# Patient Record
Sex: Female | Born: 1960 | Race: Black or African American | Hispanic: No | Marital: Married | Smoking: Former smoker
Health system: Southern US, Community
[De-identification: ages and names within clinical notes are randomized; demographics above are authoritative.]

## PROBLEM LIST (undated history)

## (undated) DIAGNOSIS — D126 Benign neoplasm of colon, unspecified: Secondary | ICD-10-CM

## (undated) DIAGNOSIS — K625 Hemorrhage of anus and rectum: Secondary | ICD-10-CM

## (undated) DIAGNOSIS — G4730 Sleep apnea, unspecified: Secondary | ICD-10-CM

## (undated) DIAGNOSIS — T7840XA Allergy, unspecified, initial encounter: Secondary | ICD-10-CM

## (undated) DIAGNOSIS — K648 Other hemorrhoids: Secondary | ICD-10-CM

## (undated) DIAGNOSIS — M5430 Sciatica, unspecified side: Secondary | ICD-10-CM

## (undated) DIAGNOSIS — E785 Hyperlipidemia, unspecified: Secondary | ICD-10-CM

## (undated) DIAGNOSIS — M1990 Unspecified osteoarthritis, unspecified site: Secondary | ICD-10-CM

## (undated) DIAGNOSIS — C801 Malignant (primary) neoplasm, unspecified: Secondary | ICD-10-CM

## (undated) DIAGNOSIS — J449 Chronic obstructive pulmonary disease, unspecified: Secondary | ICD-10-CM

## (undated) DIAGNOSIS — D259 Leiomyoma of uterus, unspecified: Secondary | ICD-10-CM

## (undated) DIAGNOSIS — Z720 Tobacco use: Secondary | ICD-10-CM

## (undated) DIAGNOSIS — K219 Gastro-esophageal reflux disease without esophagitis: Secondary | ICD-10-CM

## (undated) DIAGNOSIS — F1410 Cocaine abuse, uncomplicated: Secondary | ICD-10-CM

## (undated) DIAGNOSIS — J45909 Unspecified asthma, uncomplicated: Secondary | ICD-10-CM

## (undated) DIAGNOSIS — E119 Type 2 diabetes mellitus without complications: Secondary | ICD-10-CM

## (undated) DIAGNOSIS — G8929 Other chronic pain: Secondary | ICD-10-CM

## (undated) DIAGNOSIS — Z923 Personal history of irradiation: Secondary | ICD-10-CM

## (undated) DIAGNOSIS — I10 Essential (primary) hypertension: Secondary | ICD-10-CM

## (undated) DIAGNOSIS — M79606 Pain in leg, unspecified: Secondary | ICD-10-CM

## (undated) DIAGNOSIS — M549 Dorsalgia, unspecified: Secondary | ICD-10-CM

## (undated) DIAGNOSIS — G709 Myoneural disorder, unspecified: Secondary | ICD-10-CM

## (undated) DIAGNOSIS — R0600 Dyspnea, unspecified: Secondary | ICD-10-CM

## (undated) HISTORY — DX: Sciatica, unspecified side: M54.30

## (undated) HISTORY — DX: Other hemorrhoids: K64.8

## (undated) HISTORY — PX: COLONOSCOPY: SHX174

## (undated) HISTORY — DX: Benign neoplasm of colon, unspecified: D12.6

## (undated) HISTORY — DX: Other chronic pain: G89.29

## (undated) HISTORY — DX: Unspecified osteoarthritis, unspecified site: M19.90

## (undated) HISTORY — DX: Cocaine abuse, uncomplicated: F14.10

## (undated) HISTORY — DX: Hyperlipidemia, unspecified: E78.5

## (undated) HISTORY — DX: Leiomyoma of uterus, unspecified: D25.9

## (undated) HISTORY — DX: Myoneural disorder, unspecified: G70.9

## (undated) HISTORY — DX: Allergy, unspecified, initial encounter: T78.40XA

## (undated) HISTORY — PX: UPPER GASTROINTESTINAL ENDOSCOPY: SHX188

## (undated) HISTORY — DX: Malignant (primary) neoplasm, unspecified: C80.1

## (undated) HISTORY — PX: HAND SURGERY: SHX662

## (undated) HISTORY — DX: Essential (primary) hypertension: I10

## (undated) HISTORY — DX: Type 2 diabetes mellitus without complications: E11.9

## (undated) HISTORY — DX: Tobacco use: Z72.0

## (undated) HISTORY — DX: Dorsalgia, unspecified: M54.9

## (undated) HISTORY — DX: Pain in leg, unspecified: M79.606

## (undated) HISTORY — DX: Sleep apnea, unspecified: G47.30

## (undated) HISTORY — PX: REDUCTION MAMMAPLASTY: SUR839

## (undated) HISTORY — DX: Hemorrhage of anus and rectum: K62.5

## (undated) HISTORY — PX: TUBAL LIGATION: SHX77

---

## 1980-06-13 HISTORY — PX: BREAST REDUCTION SURGERY: SHX8

## 1997-12-01 ENCOUNTER — Ambulatory Visit: Admission: RE | Admit: 1997-12-01 | Discharge: 1997-12-01 | Payer: Self-pay | Admitting: Family Medicine

## 1997-12-05 ENCOUNTER — Ambulatory Visit (HOSPITAL_COMMUNITY): Admission: RE | Admit: 1997-12-05 | Discharge: 1997-12-05 | Payer: Self-pay | Admitting: Family Medicine

## 1998-01-15 ENCOUNTER — Encounter: Admission: RE | Admit: 1998-01-15 | Discharge: 1998-04-15 | Payer: Self-pay | Admitting: Family Medicine

## 1998-04-30 ENCOUNTER — Encounter: Admission: RE | Admit: 1998-04-30 | Discharge: 1998-06-22 | Payer: Self-pay | Admitting: Family Medicine

## 1999-06-01 ENCOUNTER — Other Ambulatory Visit: Admission: RE | Admit: 1999-06-01 | Discharge: 1999-06-01 | Payer: Self-pay | Admitting: Family Medicine

## 1999-07-08 ENCOUNTER — Ambulatory Visit (HOSPITAL_COMMUNITY): Admission: RE | Admit: 1999-07-08 | Discharge: 1999-07-08 | Payer: Self-pay | Admitting: Family Medicine

## 1999-07-08 ENCOUNTER — Encounter: Payer: Self-pay | Admitting: Family Medicine

## 1999-08-16 ENCOUNTER — Encounter: Admission: RE | Admit: 1999-08-16 | Discharge: 1999-11-14 | Payer: Self-pay | Admitting: Family Medicine

## 1999-11-15 ENCOUNTER — Encounter: Admission: RE | Admit: 1999-11-15 | Discharge: 2000-02-13 | Payer: Self-pay | Admitting: Family Medicine

## 2000-04-03 ENCOUNTER — Encounter: Admission: RE | Admit: 2000-04-03 | Discharge: 2000-07-02 | Payer: Self-pay | Admitting: Family Medicine

## 2000-07-28 ENCOUNTER — Encounter: Payer: Self-pay | Admitting: Emergency Medicine

## 2000-07-28 ENCOUNTER — Emergency Department (HOSPITAL_COMMUNITY): Admission: EM | Admit: 2000-07-28 | Discharge: 2000-07-28 | Payer: Self-pay | Admitting: Emergency Medicine

## 2000-10-31 ENCOUNTER — Encounter: Admission: RE | Admit: 2000-10-31 | Discharge: 2001-01-29 | Payer: Self-pay | Admitting: Family Medicine

## 2001-02-08 ENCOUNTER — Emergency Department (HOSPITAL_COMMUNITY): Admission: EM | Admit: 2001-02-08 | Discharge: 2001-02-08 | Payer: Self-pay | Admitting: Emergency Medicine

## 2001-02-08 ENCOUNTER — Encounter: Payer: Self-pay | Admitting: Internal Medicine

## 2001-12-31 ENCOUNTER — Encounter: Payer: Self-pay | Admitting: Emergency Medicine

## 2001-12-31 ENCOUNTER — Emergency Department (HOSPITAL_COMMUNITY): Admission: EM | Admit: 2001-12-31 | Discharge: 2001-12-31 | Payer: Self-pay | Admitting: Emergency Medicine

## 2002-01-19 ENCOUNTER — Ambulatory Visit (HOSPITAL_COMMUNITY): Admission: RE | Admit: 2002-01-19 | Discharge: 2002-01-19 | Payer: Self-pay | Admitting: Orthopedic Surgery

## 2002-01-19 ENCOUNTER — Encounter: Payer: Self-pay | Admitting: Orthopedic Surgery

## 2002-02-06 ENCOUNTER — Encounter: Payer: Self-pay | Admitting: Orthopedic Surgery

## 2002-02-06 ENCOUNTER — Encounter: Admission: RE | Admit: 2002-02-06 | Discharge: 2002-02-06 | Payer: Self-pay | Admitting: Orthopedic Surgery

## 2002-02-21 ENCOUNTER — Encounter: Admission: RE | Admit: 2002-02-21 | Discharge: 2002-02-21 | Payer: Self-pay | Admitting: *Deleted

## 2002-02-21 ENCOUNTER — Encounter: Payer: Self-pay | Admitting: Orthopedic Surgery

## 2002-07-26 ENCOUNTER — Emergency Department (HOSPITAL_COMMUNITY): Admission: EM | Admit: 2002-07-26 | Discharge: 2002-07-26 | Payer: Self-pay | Admitting: Emergency Medicine

## 2002-07-26 ENCOUNTER — Encounter: Payer: Self-pay | Admitting: Emergency Medicine

## 2004-03-06 ENCOUNTER — Emergency Department (HOSPITAL_COMMUNITY): Admission: EM | Admit: 2004-03-06 | Discharge: 2004-03-07 | Payer: Self-pay | Admitting: Emergency Medicine

## 2004-12-15 ENCOUNTER — Inpatient Hospital Stay (HOSPITAL_COMMUNITY): Admission: EM | Admit: 2004-12-15 | Discharge: 2004-12-17 | Payer: Self-pay | Admitting: Emergency Medicine

## 2005-06-22 ENCOUNTER — Encounter: Admission: RE | Admit: 2005-06-22 | Discharge: 2005-06-22 | Payer: Self-pay | Admitting: *Deleted

## 2005-07-17 ENCOUNTER — Emergency Department (HOSPITAL_COMMUNITY): Admission: EM | Admit: 2005-07-17 | Discharge: 2005-07-17 | Payer: Self-pay | Admitting: Emergency Medicine

## 2006-06-07 ENCOUNTER — Encounter (INDEPENDENT_AMBULATORY_CARE_PROVIDER_SITE_OTHER): Payer: Self-pay | Admitting: Internal Medicine

## 2006-06-13 HISTORY — PX: TONSILLECTOMY: SUR1361

## 2006-08-28 ENCOUNTER — Ambulatory Visit (HOSPITAL_COMMUNITY): Admission: RE | Admit: 2006-08-28 | Discharge: 2006-08-28 | Payer: Self-pay | Admitting: Obstetrics and Gynecology

## 2006-12-18 ENCOUNTER — Encounter (INDEPENDENT_AMBULATORY_CARE_PROVIDER_SITE_OTHER): Payer: Self-pay | Admitting: Internal Medicine

## 2006-12-18 ENCOUNTER — Encounter (INDEPENDENT_AMBULATORY_CARE_PROVIDER_SITE_OTHER): Payer: Self-pay | Admitting: Gastroenterology

## 2006-12-18 ENCOUNTER — Ambulatory Visit (HOSPITAL_COMMUNITY): Admission: RE | Admit: 2006-12-18 | Discharge: 2006-12-18 | Payer: Self-pay | Admitting: Gastroenterology

## 2007-05-24 ENCOUNTER — Encounter (INDEPENDENT_AMBULATORY_CARE_PROVIDER_SITE_OTHER): Payer: Self-pay | Admitting: Obstetrics and Gynecology

## 2007-05-24 ENCOUNTER — Inpatient Hospital Stay (HOSPITAL_COMMUNITY): Admission: RE | Admit: 2007-05-24 | Discharge: 2007-05-27 | Payer: Self-pay | Admitting: Obstetrics and Gynecology

## 2007-05-24 DIAGNOSIS — Z90710 Acquired absence of both cervix and uterus: Secondary | ICD-10-CM | POA: Insufficient documentation

## 2007-05-24 HISTORY — PX: TOTAL ABDOMINAL HYSTERECTOMY: SHX209

## 2008-08-21 ENCOUNTER — Encounter (INDEPENDENT_AMBULATORY_CARE_PROVIDER_SITE_OTHER): Payer: Self-pay | Admitting: Internal Medicine

## 2008-08-28 ENCOUNTER — Encounter: Admission: RE | Admit: 2008-08-28 | Discharge: 2008-08-28 | Payer: Self-pay | Admitting: Internal Medicine

## 2008-09-15 ENCOUNTER — Emergency Department (HOSPITAL_COMMUNITY): Admission: EM | Admit: 2008-09-15 | Discharge: 2008-09-15 | Payer: Self-pay | Admitting: Emergency Medicine

## 2008-11-12 ENCOUNTER — Encounter (INDEPENDENT_AMBULATORY_CARE_PROVIDER_SITE_OTHER): Payer: Self-pay | Admitting: Internal Medicine

## 2008-12-09 ENCOUNTER — Encounter (INDEPENDENT_AMBULATORY_CARE_PROVIDER_SITE_OTHER): Payer: Self-pay | Admitting: Internal Medicine

## 2008-12-09 ENCOUNTER — Ambulatory Visit: Payer: Self-pay | Admitting: Internal Medicine

## 2008-12-09 DIAGNOSIS — M5126 Other intervertebral disc displacement, lumbar region: Secondary | ICD-10-CM | POA: Insufficient documentation

## 2008-12-09 DIAGNOSIS — E1169 Type 2 diabetes mellitus with other specified complication: Secondary | ICD-10-CM | POA: Insufficient documentation

## 2008-12-09 DIAGNOSIS — K625 Hemorrhage of anus and rectum: Secondary | ICD-10-CM | POA: Insufficient documentation

## 2008-12-09 DIAGNOSIS — E785 Hyperlipidemia, unspecified: Secondary | ICD-10-CM | POA: Insufficient documentation

## 2008-12-09 DIAGNOSIS — E119 Type 2 diabetes mellitus without complications: Secondary | ICD-10-CM | POA: Insufficient documentation

## 2008-12-09 DIAGNOSIS — Z87891 Personal history of nicotine dependence: Secondary | ICD-10-CM | POA: Insufficient documentation

## 2008-12-09 DIAGNOSIS — Z9189 Other specified personal risk factors, not elsewhere classified: Secondary | ICD-10-CM | POA: Insufficient documentation

## 2008-12-09 DIAGNOSIS — D259 Leiomyoma of uterus, unspecified: Secondary | ICD-10-CM | POA: Insufficient documentation

## 2008-12-09 HISTORY — DX: Hemorrhage of anus and rectum: K62.5

## 2008-12-09 LAB — CONVERTED CEMR LAB: Hgb A1c MFr Bld: 9 %

## 2008-12-10 ENCOUNTER — Encounter (INDEPENDENT_AMBULATORY_CARE_PROVIDER_SITE_OTHER): Payer: Self-pay | Admitting: Internal Medicine

## 2008-12-10 LAB — CONVERTED CEMR LAB
ALT: 54 units/L — ABNORMAL HIGH (ref 0–35)
Alkaline Phosphatase: 71 units/L (ref 39–117)
CO2: 27 meq/L (ref 19–32)
HCT: 41.4 % (ref 36.0–46.0)
Hemoglobin: 13.2 g/dL (ref 12.0–15.0)
LDL Cholesterol: 103 mg/dL — ABNORMAL HIGH (ref 0–99)
MCHC: 31.9 g/dL (ref 30.0–36.0)
MCV: 85.7 fL (ref 78.0–100.0)
Potassium: 3.8 meq/L (ref 3.5–5.3)
RBC: 4.83 M/uL (ref 3.87–5.11)
Sodium: 139 meq/L (ref 135–145)
Total Bilirubin: 0.3 mg/dL (ref 0.3–1.2)
Total Protein: 8 g/dL (ref 6.0–8.3)
VLDL: 21 mg/dL (ref 0–40)

## 2008-12-16 ENCOUNTER — Encounter: Admission: RE | Admit: 2008-12-16 | Discharge: 2008-12-16 | Payer: Self-pay | Admitting: Sports Medicine

## 2008-12-29 ENCOUNTER — Encounter: Admission: RE | Admit: 2008-12-29 | Discharge: 2008-12-29 | Payer: Self-pay | Admitting: Sports Medicine

## 2009-01-06 ENCOUNTER — Emergency Department (HOSPITAL_COMMUNITY): Admission: EM | Admit: 2009-01-06 | Discharge: 2009-01-06 | Payer: Self-pay | Admitting: Family Medicine

## 2009-01-08 ENCOUNTER — Ambulatory Visit: Payer: Self-pay | Admitting: Internal Medicine

## 2009-01-08 DIAGNOSIS — K219 Gastro-esophageal reflux disease without esophagitis: Secondary | ICD-10-CM | POA: Insufficient documentation

## 2009-01-08 LAB — CONVERTED CEMR LAB: Blood Glucose, Home Monitor: 2 mg/dL

## 2009-01-13 LAB — CONVERTED CEMR LAB
AST: 51 units/L — ABNORMAL HIGH (ref 0–37)
Albumin: 4.2 g/dL (ref 3.5–5.2)
Alkaline Phosphatase: 57 units/L (ref 39–117)
BUN: 21 mg/dL (ref 6–23)
Potassium: 4.1 meq/L (ref 3.5–5.3)
Sodium: 140 meq/L (ref 135–145)
Total Protein: 8.4 g/dL — ABNORMAL HIGH (ref 6.0–8.3)

## 2009-01-20 ENCOUNTER — Emergency Department (HOSPITAL_COMMUNITY): Admission: EM | Admit: 2009-01-20 | Discharge: 2009-01-20 | Payer: Self-pay | Admitting: Emergency Medicine

## 2009-01-20 ENCOUNTER — Encounter: Payer: Self-pay | Admitting: Internal Medicine

## 2009-02-18 ENCOUNTER — Encounter (INDEPENDENT_AMBULATORY_CARE_PROVIDER_SITE_OTHER): Payer: Self-pay | Admitting: Internal Medicine

## 2009-04-03 ENCOUNTER — Encounter (INDEPENDENT_AMBULATORY_CARE_PROVIDER_SITE_OTHER): Payer: Self-pay | Admitting: Internal Medicine

## 2009-04-03 ENCOUNTER — Ambulatory Visit: Payer: Self-pay | Admitting: Internal Medicine

## 2009-04-03 DIAGNOSIS — R1084 Generalized abdominal pain: Secondary | ICD-10-CM | POA: Insufficient documentation

## 2009-04-03 DIAGNOSIS — R7401 Elevation of levels of liver transaminase levels: Secondary | ICD-10-CM | POA: Insufficient documentation

## 2009-04-03 DIAGNOSIS — R740 Nonspecific elevation of levels of transaminase and lactic acid dehydrogenase [LDH]: Secondary | ICD-10-CM

## 2009-04-03 LAB — CONVERTED CEMR LAB: Hgb A1c MFr Bld: 7.5 %

## 2009-04-07 LAB — CONVERTED CEMR LAB
ALT: 69 units/L — ABNORMAL HIGH (ref 0–35)
AST: 60 units/L — ABNORMAL HIGH (ref 0–37)
Calcium: 10.4 mg/dL (ref 8.4–10.5)
Chloride: 97 meq/L (ref 96–112)
Creatinine, Ser: 0.88 mg/dL (ref 0.40–1.20)
Creatinine, Urine: 194.9 mg/dL
HCV Ab: NEGATIVE
Hep B Core Total Ab: NEGATIVE
Hepatitis B Surface Ag: NEGATIVE
LDL Cholesterol: 174 mg/dL — ABNORMAL HIGH (ref 0–99)
Microalb Creat Ratio: 4.2 mg/g (ref 0.0–30.0)
Total CHOL/HDL Ratio: 7
VLDL: 31 mg/dL (ref 0–40)

## 2009-04-15 ENCOUNTER — Ambulatory Visit: Payer: Self-pay | Admitting: Infectious Disease

## 2009-04-15 ENCOUNTER — Emergency Department (HOSPITAL_COMMUNITY): Admission: EM | Admit: 2009-04-15 | Discharge: 2009-04-15 | Payer: Self-pay | Admitting: Family Medicine

## 2009-04-15 ENCOUNTER — Ambulatory Visit: Payer: Self-pay | Admitting: Internal Medicine

## 2009-04-15 ENCOUNTER — Encounter (INDEPENDENT_AMBULATORY_CARE_PROVIDER_SITE_OTHER): Payer: Self-pay | Admitting: Internal Medicine

## 2009-04-15 ENCOUNTER — Inpatient Hospital Stay (HOSPITAL_COMMUNITY): Admission: AD | Admit: 2009-04-15 | Discharge: 2009-04-18 | Payer: Self-pay | Admitting: Internal Medicine

## 2009-04-15 DIAGNOSIS — R1013 Epigastric pain: Secondary | ICD-10-CM

## 2009-04-15 DIAGNOSIS — Z9189 Other specified personal risk factors, not elsewhere classified: Secondary | ICD-10-CM

## 2009-04-15 DIAGNOSIS — R079 Chest pain, unspecified: Secondary | ICD-10-CM | POA: Insufficient documentation

## 2009-04-15 DIAGNOSIS — K921 Melena: Secondary | ICD-10-CM | POA: Insufficient documentation

## 2009-04-17 ENCOUNTER — Encounter (INDEPENDENT_AMBULATORY_CARE_PROVIDER_SITE_OTHER): Payer: Self-pay | Admitting: Internal Medicine

## 2009-04-21 ENCOUNTER — Ambulatory Visit: Payer: Self-pay | Admitting: Infectious Disease

## 2009-04-28 ENCOUNTER — Ambulatory Visit (HOSPITAL_COMMUNITY): Admission: RE | Admit: 2009-04-28 | Discharge: 2009-04-28 | Payer: Self-pay | Admitting: Gastroenterology

## 2009-04-30 ENCOUNTER — Ambulatory Visit: Payer: Self-pay | Admitting: Internal Medicine

## 2009-04-30 DIAGNOSIS — E876 Hypokalemia: Secondary | ICD-10-CM

## 2009-04-30 LAB — CONVERTED CEMR LAB
BUN: 7 mg/dL (ref 6–23)
CO2: 26 meq/L (ref 19–32)
Chloride: 106 meq/L (ref 96–112)
Creatinine, Ser: 0.66 mg/dL (ref 0.40–1.20)
Glucose, Bld: 80 mg/dL (ref 70–99)

## 2009-05-04 ENCOUNTER — Telehealth (INDEPENDENT_AMBULATORY_CARE_PROVIDER_SITE_OTHER): Payer: Self-pay | Admitting: Internal Medicine

## 2009-05-11 ENCOUNTER — Encounter (INDEPENDENT_AMBULATORY_CARE_PROVIDER_SITE_OTHER): Payer: Self-pay | Admitting: Internal Medicine

## 2009-05-18 ENCOUNTER — Telehealth (INDEPENDENT_AMBULATORY_CARE_PROVIDER_SITE_OTHER): Payer: Self-pay | Admitting: *Deleted

## 2009-05-26 ENCOUNTER — Ambulatory Visit: Payer: Self-pay | Admitting: Internal Medicine

## 2009-05-26 ENCOUNTER — Encounter (INDEPENDENT_AMBULATORY_CARE_PROVIDER_SITE_OTHER): Payer: Self-pay | Admitting: Internal Medicine

## 2009-05-26 ENCOUNTER — Telehealth: Payer: Self-pay | Admitting: Internal Medicine

## 2009-06-09 ENCOUNTER — Telehealth (INDEPENDENT_AMBULATORY_CARE_PROVIDER_SITE_OTHER): Payer: Self-pay | Admitting: Internal Medicine

## 2009-06-11 ENCOUNTER — Encounter (INDEPENDENT_AMBULATORY_CARE_PROVIDER_SITE_OTHER): Payer: Self-pay | Admitting: Internal Medicine

## 2009-06-11 ENCOUNTER — Ambulatory Visit: Payer: Self-pay | Admitting: Internal Medicine

## 2009-06-11 DIAGNOSIS — J069 Acute upper respiratory infection, unspecified: Secondary | ICD-10-CM | POA: Insufficient documentation

## 2009-06-11 DIAGNOSIS — J351 Hypertrophy of tonsils: Secondary | ICD-10-CM

## 2009-06-12 ENCOUNTER — Encounter (INDEPENDENT_AMBULATORY_CARE_PROVIDER_SITE_OTHER): Payer: Self-pay | Admitting: Internal Medicine

## 2009-06-17 LAB — CONVERTED CEMR LAB
Alkaline Phosphatase: 65 units/L (ref 39–117)
BUN: 7 mg/dL (ref 6–23)
Cholesterol: 187 mg/dL (ref 0–200)
Glucose, Bld: 179 mg/dL — ABNORMAL HIGH (ref 70–99)
HDL: 33 mg/dL — ABNORMAL LOW (ref >39)
LDL Cholesterol: 131 mg/dL — ABNORMAL HIGH (ref 0–99)
Total Bilirubin: 0.3 mg/dL (ref 0.3–1.2)
Triglycerides: 114 mg/dL (ref <150)
VLDL: 23 mg/dL (ref 0–40)

## 2009-06-25 ENCOUNTER — Encounter (INDEPENDENT_AMBULATORY_CARE_PROVIDER_SITE_OTHER): Payer: Self-pay | Admitting: Internal Medicine

## 2009-06-25 ENCOUNTER — Ambulatory Visit: Payer: Self-pay | Admitting: Internal Medicine

## 2009-06-26 ENCOUNTER — Telehealth (INDEPENDENT_AMBULATORY_CARE_PROVIDER_SITE_OTHER): Payer: Self-pay | Admitting: Internal Medicine

## 2009-07-01 ENCOUNTER — Telehealth (INDEPENDENT_AMBULATORY_CARE_PROVIDER_SITE_OTHER): Payer: Self-pay | Admitting: Internal Medicine

## 2009-07-07 ENCOUNTER — Ambulatory Visit: Payer: Self-pay | Admitting: Internal Medicine

## 2009-07-07 DIAGNOSIS — I10 Essential (primary) hypertension: Secondary | ICD-10-CM

## 2009-07-07 DIAGNOSIS — I152 Hypertension secondary to endocrine disorders: Secondary | ICD-10-CM | POA: Insufficient documentation

## 2009-07-07 DIAGNOSIS — E559 Vitamin D deficiency, unspecified: Secondary | ICD-10-CM | POA: Insufficient documentation

## 2009-07-09 ENCOUNTER — Ambulatory Visit (HOSPITAL_COMMUNITY)
Admission: RE | Admit: 2009-07-09 | Discharge: 2009-07-10 | Payer: Self-pay | Source: Home / Self Care | Admitting: Otolaryngology

## 2009-07-09 ENCOUNTER — Encounter (INDEPENDENT_AMBULATORY_CARE_PROVIDER_SITE_OTHER): Payer: Self-pay | Admitting: Otolaryngology

## 2009-07-13 ENCOUNTER — Telehealth: Payer: Self-pay | Admitting: Internal Medicine

## 2009-07-18 ENCOUNTER — Emergency Department (HOSPITAL_COMMUNITY): Admission: EM | Admit: 2009-07-18 | Discharge: 2009-07-19 | Payer: Self-pay | Admitting: Emergency Medicine

## 2009-07-23 ENCOUNTER — Ambulatory Visit: Payer: Self-pay | Admitting: Internal Medicine

## 2009-07-23 ENCOUNTER — Encounter (INDEPENDENT_AMBULATORY_CARE_PROVIDER_SITE_OTHER): Payer: Self-pay | Admitting: Internal Medicine

## 2009-07-23 LAB — CONVERTED CEMR LAB: Blood Glucose, Fingerstick: 266

## 2009-07-28 ENCOUNTER — Ambulatory Visit: Payer: Self-pay | Admitting: Internal Medicine

## 2009-07-28 LAB — CONVERTED CEMR LAB: Hep A IgM: NEGATIVE

## 2009-07-29 ENCOUNTER — Encounter (INDEPENDENT_AMBULATORY_CARE_PROVIDER_SITE_OTHER): Payer: Self-pay | Admitting: Internal Medicine

## 2009-07-30 LAB — CONVERTED CEMR LAB
ALT: 51 units/L — ABNORMAL HIGH (ref 0–35)
Albumin: 4 g/dL (ref 3.5–5.2)
BUN: 12 mg/dL (ref 6–23)
CO2: 31 meq/L (ref 19–32)
Calcium: 9.9 mg/dL (ref 8.4–10.5)
Chloride: 95 meq/L — ABNORMAL LOW (ref 96–112)
Cholesterol: 192 mg/dL (ref 0–200)
Creatinine, Ser: 0.71 mg/dL (ref 0.40–1.20)
Potassium: 3.8 meq/L (ref 3.5–5.3)
Total CHOL/HDL Ratio: 5.6

## 2009-08-06 ENCOUNTER — Encounter (INDEPENDENT_AMBULATORY_CARE_PROVIDER_SITE_OTHER): Payer: Self-pay | Admitting: Internal Medicine

## 2009-08-06 ENCOUNTER — Ambulatory Visit: Payer: Self-pay | Admitting: Internal Medicine

## 2009-08-06 LAB — CONVERTED CEMR LAB: Blood Glucose, Fingerstick: 118

## 2009-08-07 ENCOUNTER — Telehealth (INDEPENDENT_AMBULATORY_CARE_PROVIDER_SITE_OTHER): Payer: Self-pay | Admitting: Internal Medicine

## 2009-08-13 ENCOUNTER — Ambulatory Visit (HOSPITAL_COMMUNITY): Admission: RE | Admit: 2009-08-13 | Discharge: 2009-08-13 | Payer: Self-pay | Admitting: Sports Medicine

## 2009-08-18 ENCOUNTER — Ambulatory Visit: Payer: Self-pay | Admitting: Internal Medicine

## 2009-08-18 ENCOUNTER — Encounter (INDEPENDENT_AMBULATORY_CARE_PROVIDER_SITE_OTHER): Payer: Self-pay | Admitting: Internal Medicine

## 2009-08-25 ENCOUNTER — Encounter: Admission: RE | Admit: 2009-08-25 | Discharge: 2009-08-25 | Payer: Self-pay | Admitting: Sports Medicine

## 2009-08-31 ENCOUNTER — Ambulatory Visit: Payer: Self-pay | Admitting: Internal Medicine

## 2009-08-31 ENCOUNTER — Telehealth: Payer: Self-pay | Admitting: *Deleted

## 2009-08-31 ENCOUNTER — Encounter (INDEPENDENT_AMBULATORY_CARE_PROVIDER_SITE_OTHER): Payer: Self-pay | Admitting: Internal Medicine

## 2009-08-31 DIAGNOSIS — R059 Cough, unspecified: Secondary | ICD-10-CM | POA: Insufficient documentation

## 2009-08-31 DIAGNOSIS — R05 Cough: Secondary | ICD-10-CM

## 2009-09-02 ENCOUNTER — Ambulatory Visit (HOSPITAL_COMMUNITY): Admission: RE | Admit: 2009-09-02 | Discharge: 2009-09-02 | Payer: Self-pay | Admitting: Internal Medicine

## 2009-09-14 ENCOUNTER — Encounter: Admission: RE | Admit: 2009-09-14 | Discharge: 2009-09-14 | Payer: Self-pay | Admitting: Sports Medicine

## 2009-09-28 ENCOUNTER — Encounter: Admission: RE | Admit: 2009-09-28 | Discharge: 2009-09-28 | Payer: Self-pay | Admitting: Sports Medicine

## 2009-09-28 ENCOUNTER — Encounter (INDEPENDENT_AMBULATORY_CARE_PROVIDER_SITE_OTHER): Payer: Self-pay | Admitting: Internal Medicine

## 2009-09-30 ENCOUNTER — Ambulatory Visit: Payer: Self-pay | Admitting: Internal Medicine

## 2009-09-30 LAB — CONVERTED CEMR LAB
Blood Glucose, Fingerstick: 138
Hgb A1c MFr Bld: 9.2 %

## 2009-10-05 ENCOUNTER — Encounter (INDEPENDENT_AMBULATORY_CARE_PROVIDER_SITE_OTHER): Payer: Self-pay | Admitting: Internal Medicine

## 2009-10-05 LAB — CONVERTED CEMR LAB
Alkaline Phosphatase: 73 units/L (ref 39–117)
BUN: 16 mg/dL (ref 6–23)
CO2: 27 meq/L (ref 19–32)
Cholesterol: 254 mg/dL — ABNORMAL HIGH (ref 0–200)
Creatinine, Ser: 0.6 mg/dL (ref 0.40–1.20)
Glucose, Bld: 132 mg/dL — ABNORMAL HIGH (ref 70–99)
HDL: 55 mg/dL (ref >39)
LDL Cholesterol: 179 mg/dL — ABNORMAL HIGH (ref 0–99)
Microalb, Ur: 0.99 mg/dL (ref 0.00–1.89)
Sodium: 138 meq/L (ref 135–145)
Total Bilirubin: 0.4 mg/dL (ref 0.3–1.2)
Total CHOL/HDL Ratio: 4.6
Total Protein: 8.3 g/dL (ref 6.0–8.3)
Triglycerides: 101 mg/dL (ref <150)
VLDL: 20 mg/dL (ref 0–40)

## 2009-10-08 ENCOUNTER — Encounter (INDEPENDENT_AMBULATORY_CARE_PROVIDER_SITE_OTHER): Payer: Self-pay | Admitting: Internal Medicine

## 2009-10-19 ENCOUNTER — Telehealth: Payer: Self-pay | Admitting: Internal Medicine

## 2009-10-19 ENCOUNTER — Ambulatory Visit: Payer: Self-pay | Admitting: Internal Medicine

## 2009-10-19 DIAGNOSIS — R51 Headache: Secondary | ICD-10-CM

## 2009-10-19 DIAGNOSIS — R519 Headache, unspecified: Secondary | ICD-10-CM | POA: Insufficient documentation

## 2009-10-19 LAB — CONVERTED CEMR LAB
BUN: 6 mg/dL (ref 6–23)
CO2: 33 meq/L — ABNORMAL HIGH (ref 19–32)
Calcium: 9.9 mg/dL (ref 8.4–10.5)
Chloride: 100 meq/L (ref 96–112)
Creatinine, Ser: 0.63 mg/dL (ref 0.40–1.20)
Glucose, Bld: 325 mg/dL — ABNORMAL HIGH (ref 70–99)

## 2009-11-06 ENCOUNTER — Ambulatory Visit: Payer: Self-pay | Admitting: Infectious Disease

## 2009-11-06 DIAGNOSIS — R110 Nausea: Secondary | ICD-10-CM

## 2009-11-23 ENCOUNTER — Ambulatory Visit: Payer: Self-pay | Admitting: Internal Medicine

## 2009-11-23 ENCOUNTER — Encounter (INDEPENDENT_AMBULATORY_CARE_PROVIDER_SITE_OTHER): Payer: Self-pay | Admitting: *Deleted

## 2009-11-24 LAB — CONVERTED CEMR LAB
ALT: 38 units/L — ABNORMAL HIGH (ref 0–35)
AST: 35 units/L (ref 0–37)
BUN: 10 mg/dL (ref 6–23)
Calcium: 9.6 mg/dL (ref 8.4–10.5)
Chloride: 102 meq/L (ref 96–112)
Cholesterol: 184 mg/dL (ref 0–200)
Creatinine, Ser: 0.65 mg/dL (ref 0.40–1.20)
HDL: 35 mg/dL — ABNORMAL LOW (ref >39)
Total Bilirubin: 0.3 mg/dL (ref 0.3–1.2)
Total CHOL/HDL Ratio: 5.3
VLDL: 27 mg/dL (ref 0–40)

## 2009-11-30 ENCOUNTER — Ambulatory Visit: Payer: Self-pay | Admitting: Internal Medicine

## 2009-12-01 ENCOUNTER — Encounter: Payer: Self-pay | Admitting: Internal Medicine

## 2009-12-07 ENCOUNTER — Telehealth (INDEPENDENT_AMBULATORY_CARE_PROVIDER_SITE_OTHER): Payer: Self-pay | Admitting: *Deleted

## 2009-12-16 ENCOUNTER — Emergency Department (HOSPITAL_COMMUNITY)
Admission: EM | Admit: 2009-12-16 | Discharge: 2009-12-16 | Payer: Self-pay | Source: Home / Self Care | Admitting: Emergency Medicine

## 2009-12-16 ENCOUNTER — Telehealth: Payer: Self-pay | Admitting: Internal Medicine

## 2009-12-17 ENCOUNTER — Ambulatory Visit: Payer: Self-pay | Admitting: Internal Medicine

## 2009-12-17 ENCOUNTER — Ambulatory Visit (HOSPITAL_COMMUNITY)
Admission: RE | Admit: 2009-12-17 | Discharge: 2009-12-17 | Payer: Self-pay | Source: Home / Self Care | Admitting: Internal Medicine

## 2009-12-17 ENCOUNTER — Telehealth: Payer: Self-pay | Admitting: Internal Medicine

## 2009-12-17 LAB — CONVERTED CEMR LAB
Blood Glucose, Fingerstick: 293
Hgb A1c MFr Bld: 8.9 %

## 2009-12-24 ENCOUNTER — Telehealth: Payer: Self-pay | Admitting: Internal Medicine

## 2009-12-25 ENCOUNTER — Telehealth: Payer: Self-pay | Admitting: Internal Medicine

## 2010-01-01 ENCOUNTER — Ambulatory Visit: Payer: Self-pay | Admitting: Internal Medicine

## 2010-01-01 ENCOUNTER — Encounter: Payer: Self-pay | Admitting: Internal Medicine

## 2010-01-06 ENCOUNTER — Telehealth: Payer: Self-pay | Admitting: *Deleted

## 2010-01-08 ENCOUNTER — Telehealth: Payer: Self-pay | Admitting: Internal Medicine

## 2010-01-15 ENCOUNTER — Telehealth (INDEPENDENT_AMBULATORY_CARE_PROVIDER_SITE_OTHER): Payer: Self-pay | Admitting: *Deleted

## 2010-01-20 ENCOUNTER — Encounter: Payer: Self-pay | Admitting: Internal Medicine

## 2010-01-20 ENCOUNTER — Telehealth: Payer: Self-pay | Admitting: Internal Medicine

## 2010-01-20 ENCOUNTER — Emergency Department (HOSPITAL_COMMUNITY)
Admission: EM | Admit: 2010-01-20 | Discharge: 2010-01-20 | Payer: Self-pay | Source: Home / Self Care | Admitting: Emergency Medicine

## 2010-01-20 ENCOUNTER — Ambulatory Visit: Payer: Self-pay | Admitting: Internal Medicine

## 2010-01-20 DIAGNOSIS — A6000 Herpesviral infection of urogenital system, unspecified: Secondary | ICD-10-CM | POA: Insufficient documentation

## 2010-01-22 ENCOUNTER — Telehealth: Payer: Self-pay | Admitting: Internal Medicine

## 2010-01-27 ENCOUNTER — Telehealth: Payer: Self-pay | Admitting: Internal Medicine

## 2010-02-01 ENCOUNTER — Encounter: Payer: Self-pay | Admitting: Internal Medicine

## 2010-02-04 ENCOUNTER — Telehealth: Payer: Self-pay | Admitting: Internal Medicine

## 2010-02-11 ENCOUNTER — Telehealth: Payer: Self-pay | Admitting: *Deleted

## 2010-02-11 ENCOUNTER — Emergency Department (HOSPITAL_COMMUNITY)
Admission: EM | Admit: 2010-02-11 | Discharge: 2010-02-11 | Payer: Self-pay | Source: Home / Self Care | Admitting: Emergency Medicine

## 2010-02-12 ENCOUNTER — Encounter: Payer: Self-pay | Admitting: Internal Medicine

## 2010-02-12 ENCOUNTER — Ambulatory Visit: Payer: Self-pay | Admitting: Internal Medicine

## 2010-02-12 DIAGNOSIS — M79609 Pain in unspecified limb: Secondary | ICD-10-CM | POA: Insufficient documentation

## 2010-02-16 ENCOUNTER — Encounter: Payer: Self-pay | Admitting: Internal Medicine

## 2010-02-16 LAB — CONVERTED CEMR LAB
Amphetamine Screen, Ur: NEGATIVE
Barbiturate Quant, Ur: NEGATIVE
Cocaine Metabolites: NEGATIVE
Marijuana Metabolite: NEGATIVE
Opiates: POSITIVE — AB
Phencyclidine (PCP): NEGATIVE

## 2010-02-17 ENCOUNTER — Encounter: Payer: Self-pay | Admitting: Licensed Clinical Social Worker

## 2010-02-17 ENCOUNTER — Telehealth: Payer: Self-pay | Admitting: Licensed Clinical Social Worker

## 2010-02-18 ENCOUNTER — Telehealth: Payer: Self-pay | Admitting: Internal Medicine

## 2010-02-22 ENCOUNTER — Encounter: Payer: Self-pay | Admitting: Licensed Clinical Social Worker

## 2010-02-24 ENCOUNTER — Encounter: Payer: Self-pay | Admitting: Internal Medicine

## 2010-03-01 ENCOUNTER — Encounter
Admission: RE | Admit: 2010-03-01 | Discharge: 2010-04-22 | Payer: Self-pay | Source: Home / Self Care | Admitting: Internal Medicine

## 2010-03-02 ENCOUNTER — Telehealth: Payer: Self-pay | Admitting: *Deleted

## 2010-03-06 ENCOUNTER — Encounter: Payer: Self-pay | Admitting: Internal Medicine

## 2010-03-25 ENCOUNTER — Ambulatory Visit: Payer: Self-pay | Admitting: Internal Medicine

## 2010-03-25 DIAGNOSIS — H538 Other visual disturbances: Secondary | ICD-10-CM

## 2010-03-30 ENCOUNTER — Encounter: Payer: Self-pay | Admitting: Internal Medicine

## 2010-04-22 ENCOUNTER — Encounter: Payer: Self-pay | Admitting: Internal Medicine

## 2010-04-28 ENCOUNTER — Ambulatory Visit: Payer: Self-pay | Admitting: Internal Medicine

## 2010-04-28 LAB — CONVERTED CEMR LAB: Blood Glucose, Fingerstick: 156

## 2010-04-28 LAB — HM DIABETES FOOT EXAM

## 2010-05-24 ENCOUNTER — Encounter: Payer: Self-pay | Admitting: Internal Medicine

## 2010-06-01 ENCOUNTER — Telehealth: Payer: Self-pay | Admitting: *Deleted

## 2010-06-01 ENCOUNTER — Telehealth: Payer: Self-pay | Admitting: Internal Medicine

## 2010-06-08 ENCOUNTER — Telehealth: Payer: Self-pay | Admitting: Internal Medicine

## 2010-06-11 ENCOUNTER — Telehealth: Payer: Self-pay | Admitting: Internal Medicine

## 2010-06-11 ENCOUNTER — Emergency Department (HOSPITAL_COMMUNITY)
Admission: EM | Admit: 2010-06-11 | Discharge: 2010-06-11 | Payer: Self-pay | Source: Home / Self Care | Admitting: Family Medicine

## 2010-06-13 HISTORY — PX: BACK SURGERY: SHX140

## 2010-06-14 ENCOUNTER — Encounter: Payer: Self-pay | Admitting: Internal Medicine

## 2010-06-14 ENCOUNTER — Telehealth: Payer: Self-pay | Admitting: Internal Medicine

## 2010-06-21 ENCOUNTER — Telehealth: Payer: Self-pay | Admitting: Internal Medicine

## 2010-06-29 ENCOUNTER — Encounter: Payer: Self-pay | Admitting: Internal Medicine

## 2010-06-29 ENCOUNTER — Telehealth: Payer: Self-pay | Admitting: *Deleted

## 2010-07-07 ENCOUNTER — Telehealth: Payer: Self-pay | Admitting: *Deleted

## 2010-07-15 NOTE — Miscellaneous (Signed)
Summary: REHABILITATION  DISCHARGE PT SERVICES  REHABILITATION  DISCHARGE PT SERVICES   Imported By: Shon Hough 05/04/2010 12:13:53  _____________________________________________________________________  External Attachment:    Type:   Image     Comment:   External Document

## 2010-07-15 NOTE — Assessment & Plan Note (Signed)
Summary: ? side effects of neurotin/pcp-bowers/hla   Vital Signs:  Patient profile:   50 year old female Height:      64.5 inches (163.83 cm) Weight:      248.3 pounds (112.86 kg) BMI:     42.11 Pulse rate:   83 / minute BP sitting:   121 / 82  (left arm)  Vitals Entered By: Cynda Familia Duncan Dull) (March 25, 2010 9:21 AM) CC: pt c/o blurry visionx 2wks, bilateral ear pain Is Patient Diabetic? No Pain Assessment Patient in pain? yes     Location: back Intensity: 7 Type: aching Onset of pain  Chronic Nutritional Status BMI of > 30 = obese CBG Result 199  Have you ever been in a relationship where you felt threatened, hurt or afraid?No   Does patient need assistance? Functional Status Self care Ambulation Normal      Diabetic Foot Exam Foot Inspection Is there a history of a foot ulcer?              No Is there a foot ulcer now?              No Can the patient see the bottom of their feet?          Yes Are the shoes appropriate in style and fit?          Yes Is there swelling or an abnormal foot shape?          No Are the toenails long?                No Are the toenails thick?                No Are the toenails ingrown?              No Is there heavy callous build-up?              No  Diabetic Foot Care Education Patient educated on appropriate care of diabetic feet.   High Risk Feet? No   10-g (5.07) Semmes-Weinstein Monofilament Test Performed by: Lynn Ito          Right Foot          Left Foot Site 1         normal         normal Site 4         normal         normal Site 5         normal         normal Site 6         normal         normal  Impression      normal         normal   Primary Care Provider:  Whitney Post MD  CC:  pt c/o blurry visionx 2wks and bilateral ear pain.  History of Present Illness: 50 y/o w with DM, HTN, HLD becaue of blurry vision  -2 weeks - started gradually, worsening now - had an eye exam one year ago, no eye  problems at tha time - is able to see ok but cannot read-- she can read fine with reading glasses - no pain in her eyes - no watering, redness, photophobia etc   Preventive Screening-Counseling & Management  Alcohol-Tobacco     Smoking Status: quit     Year Quit: 2000  Current Medications (verified): 1)  Metformin Hcl 1000 Mg Tabs (  Metformin Hcl) .... Take 1 Tablet By Mouth Two Times A Day 2)  Glucotrol 10 Mg Tabs (Glipizide) .... Take One Tablet Once A  Day. 3)  Avapro 150 Mg Tabs (Irbesartan) .... Take 1 Tablet By Mouth Once A Day 4)  Hydrochlorothiazide 25 Mg Tabs (Hydrochlorothiazide) .... Take One Tablet Daily For Blood Pressure. 5)  Vitamin D (Ergocalciferol) 50000 Unit Caps (Ergocalciferol) .... Take One Tablet Weekly For 8 Weeks. 6)  Lantus Solostar 100 Unit/ml Soln (Insulin Glargine) .... Inject 35 Units Under The Skin At Bedtime. 7)  Unifine Pentips 31g X 8 Mm Misc (Insulin Pen Needle) .... Use As Directed. 8)  Gabapentin 800 Mg Tabs (Gabapentin) .... Take 1 Tablet By Mouth Three Times A Day 9)  Oxycodone-Acetaminophen 5-325 Mg Tabs (Oxycodone-Acetaminophen) .... Take One Pill By Mouth Every 4 Hours As Needed For Pain. 10)  Celebrex 200 Mg Caps (Celecoxib) .... Take One Tablet Twice Per Day For Acute Pain 11)  Simvastatin 40 Mg Tabs (Simvastatin) .Marland Kitchen.. 1 Tablet By Mouth Daily 12)  Nexium 40 Mg Cpdr (Esomeprazole Magnesium) .... Take One Tablet By Mouth Once A Day  Allergies (verified): No Known Drug Allergies  Physical Exam  General:  General: Alert, well developed and in no acurte distress ENT: mucous membranes pink & moist. No abnormal finds in ear and nose. CVC:S1 S2 , no murmurs, no abnormal heart sounds. Lungs: Clear to auscultation B/L. No wheezes, crackles or other abnormal sounds Abdomen: soft, non distended, no tender. Normal Bowel sounds EXT: no pitting edema, no engorged veins, Pulsations normal  Neuro:alert, oriented *3, cranial nerved 2-12 intact, strenght  normal in all  extremities, senstations normal to light touch.   Eyes:  vision grossly intact, pupils equal, pupils round, pupils reactive to light, pupils react to accomodation, corneas and lenses clear, and no injection.    Diabetes Management Exam:    Foot Exam (with socks and/or shoes not present):       Sensory-Monofilament:          Left foot: normal          Right foot: normal   Impression & Recommendations:  Problem # 1:  DM (ICD-250.00) imporving. coninue with meds Her updated medication list for this problem includes:    Metformin Hcl 1000 Mg Tabs (Metformin hcl) .Marland Kitchen... Take 1 tablet by mouth two times a day    Glucotrol 10 Mg Tabs (Glipizide) .Marland Kitchen... Take one tablet once a  day.    Avapro 150 Mg Tabs (Irbesartan) .Marland Kitchen... Take 1 tablet by mouth once a day    Lantus Solostar 100 Unit/ml Soln (Insulin glargine) ..... Inject 35 units under the skin at bedtime.  Orders: T- Capillary Blood Glucose ([address]) T-Hgb A1C (in-house) ([id number])  Labs Reviewed: Creat: 0.65 (11/23/2009)    Reviewed HgBA1c results: 8.5 (03/25/2010)  8.9 (12/17/2009)  Problem # 2:  BLURRED VISION (ICD-368.8)  - worsening of far sightedness, no pain, no abnormality on PE dd- diabetes retinopathy, cataract  plan - DM control - optometrist follow up for changing prescription eye glasses  - does not have insurance so opthalmology referral will be difficult  Orders: Ophthalmology Referral (Ophthalmology)  Complete Medication List: 1)  Metformin Hcl 1000 Mg Tabs (Metformin hcl) .... Take 1 tablet by mouth two times a day 2)  Glucotrol 10 Mg Tabs (Glipizide) .... Take one tablet once a  day. 3)  Avapro 150 Mg Tabs (Irbesartan) .... Take 1 tablet by mouth once a day 4)  Hydrochlorothiazide 25 Mg Tabs (  Hydrochlorothiazide) .... Take one tablet daily for blood pressure. 5)  Vitamin D (ergocalciferol) 50000 Unit Caps (Ergocalciferol) .... Take one tablet weekly for 8 weeks. 6)  Lantus Solostar 100 Unit/ml  Soln (Insulin glargine) .... Inject 35 units under the skin at bedtime. 7)  Unifine Pentips 31g X 8 Mm Misc (Insulin pen needle) .... Use as directed. 8)  Gabapentin 800 Mg Tabs (Gabapentin) .... Take 1 tablet by mouth three times a day 9)  Oxycodone-acetaminophen 5-325 Mg Tabs (Oxycodone-acetaminophen) .... Take one pill by mouth every 4 hours as needed for pain. 10)  Celebrex 200 Mg Caps (Celecoxib) .... Take one tablet twice per day for acute pain 11)  Simvastatin 40 Mg Tabs (Simvastatin) .Marland Kitchen.. 1 tablet by mouth daily 12)  Nexium 40 Mg Cpdr (Esomeprazole magnesium) .... Take one tablet by mouth once a day 13)  Ear Wax Drops 6.5 % Soln (Carbamide peroxide) .... Two times a day in both ears  Patient Instructions: 1)  See an optometrist at [facility] to get your prescription eye glasses checked 2)  sPlease schedule a follow-up appointment in 1 month with PCP Prescriptions: EAR WAX DROPS 6.5 % SOLN (CARBAMIDE PEROXIDE) two times a day in both ears  #1 x 3   Entered and Authorized by:   Bethel Born MD   Signed by:   Bethel Born MD on 03/25/2010   Method used:   Faxed to ...       [facility] (retail)       [address]       [place], [place]  [address]       Ph: [phone]       Fax: [phone]   RxID:   [id number] OXYCODONE-ACETAMINOPHEN 5-325 MG TABS (OXYCODONE-ACETAMINOPHEN) Take one pill by mouth every 4 hours as needed for pain.  #120 x 0   Entered and Authorized by:   Bethel Born MD   Signed by:   Bethel Born MD on 03/25/2010   Method used:   Print then Give to Patient   RxID:   [id number] METFORMIN HCL 1000 MG TABS (METFORMIN HCL) Take 1 tablet by mouth two times a day  #60 x 6   Entered and Authorized by:   Bethel Born MD   Signed by:   Bethel Born MD on 03/25/2010   Method used:   Faxed to ...       [facility] (retail)       [address]       [place], [place]  [address]        Ph: [phone]       Fax: [phone]   RxID:   [id number] AVAPRO 150 MG TABS (IRBESARTAN) Take 1 tablet by mouth once a day  #90 x 6   Entered and Authorized by:   Bethel Born MD   Signed by:   Bethel Born MD on 03/25/2010   Method used:   Faxed to ...       [facility] (retail)       [address]       [place], [place]  [address]       Ph: [phone]       Fax: [phone]   RxID:   [id number]    Prevention & Chronic Care Immunizations   Influenza vaccine: Fluvax Non-MCR  (02/12/2010)   Influenza vaccine deferral: Deferred  (08/31/2009)   Influenza vaccine due: 02/11/2010  Tetanus booster: Not documented   Td booster deferral: Deferred  (08/31/2009)    Pneumococcal vaccine: Not documented   Pneumococcal vaccine deferral: Deferred  (08/31/2009)  Other Screening   Pap smear: Not documented    Mammogram: Not documented   Mammogram due: 06/13/2009   Smoking status: quit  (03/25/2010)  Diabetes Mellitus   HgbA1C: 8.5  (03/25/2010)    Eye exam: Not documented   Diabetic eye exam action/deferral: Ophthalmology referral  (08/31/2009)    Foot exam: yes  (03/25/2010)   Foot exam action/deferral: Do today   High risk foot: No  (03/25/2010)   Foot care education: Done  (03/25/2010)    Urine microalbumin/creatinine ratio: 6.6  (09/30/2009)  Lipids   Total Cholesterol: 184  (11/23/2009)   LDL: 122  (11/23/2009)   LDL Direct: Not documented   HDL: 35  (11/23/2009)   Triglycerides: 133  (11/23/2009)    SGOT (AST): 35  (11/23/2009)   SGPT (ALT): 38  (11/23/2009)   Alkaline phosphatase: 64  (11/23/2009)   Total bilirubin: 0.3  (11/23/2009)  Hypertension   Last Blood Pressure: 121 / 82  (03/25/2010)   Serum creatinine: 0.65  (11/23/2009)   Serum potassium 3.7  (11/23/2009)  Self-Management Support :   Personal Goals (by the next clinic visit) :     Personal A1C goal: 7  (04/03/2009)     Personal blood  pressure goal: 140/90  (04/03/2009)     Personal LDL goal: 70  (04/03/2009)    Patient will work on the following items until the next clinic visit to reach self-care goals:     Medications and monitoring: take my medicines every day  (03/25/2010)     Eating: eat foods that are low in salt, eat baked foods instead of fried foods  (03/25/2010)     Activity: take a 30 minute walk every day  (02/12/2010)    Diabetes self-management support: Resources for patients handout  (02/12/2010)   Last diabetes self-management training by diabetes educator: 10/19/2009    Hypertension self-management support: Resources for patients handout  (02/12/2010)    Lipid self-management support: Resources for patients handout  (02/12/2010)    Nursing Instructions: Diabetic foot exam today   Laboratory Results   Blood Tests   Date/Time Received: March 25, 2010 9:59 AM Date/Time Reported: Alric Quan  March 25, 2010 9:59 AM   HGBA1C: 8.5%   (Normal Range: Non-Diabetic - 3-6%   Control Diabetic - 6-8%) CBG Random:: 199mg /dL

## 2010-07-15 NOTE — Miscellaneous (Signed)
Summary: DEPT. HEALTH& HUMAN SERVICES  DEPT. HEALTH& HUMAN SERVICES   Imported By: Margie Billet 05/31/2010 11:38:47  _____________________________________________________________________  External Attachment:    Type:   Image     Comment:   External Document

## 2010-07-15 NOTE — Letter (Signed)
Summary: Pharmacologist   Imported By: Florinda Marker 07/27/2009 14:07:17  _____________________________________________________________________  External Attachment:    Type:   Image     Comment:   External Document

## 2010-07-15 NOTE — Assessment & Plan Note (Signed)
Summary: EST-2 WEEK RECHECK/CH   Vital Signs:  Patient profile:   50 year old female Height:      64.5 inches (163.83 cm) Weight:      247.2 pounds (112.36 kg) BMI:     41.93 Temp:     97.1 degrees F (36.17 degrees C) oral Pulse rate:   77 / minute BP sitting:   140 / 94  (right arm)  Vitals Entered By: Stanton Kidney Ditzler RN (November 23, 2009 10:53 AM) Is Patient Diabetic? Yes Did you bring your meter with you today? Yes - unable to down load Pain Assessment Patient in pain? yes     Location: right upper back down to right foot Intensity: 8 Type: throbbing Onset of pain  since last week Nutritional Status BMI of > 30 = obese Nutritional Status Detail appetite good  Have you ever been in a relationship where you felt threatened, hurt or afraid?denies   Does patient need assistance? Functional Status Self care Ambulation Normal Comments Discuss pain upper right back down to right foot.  CBG this AM 212.   Primary Care Provider:  Jason Coop MD   History of Present Illness: [name] comes for a f/u visit.   1. DM: She is taking lantus 20 units and metformin and glipizide. She checks CBG 2-3 times a day at home. She is trying to eat right food. SHe walks about 15 minutes two times a day for 7 days. She has not used her neurontin. We were not able to download the meter. From her memory, tt usu runs in 200 and the highest was 247. The lowest is 168. She is still working on getting lantus from [facility].   2. Blurry vision: No more blurry vision.   3. Nausea: It has resolved.   4. Obesity: See DM. She unfortunately gained 4 lbs.   Depression History:      The patient denies a depressed mood most of the day and a diminished interest in her usual daily activities.         Preventive Screening-Counseling & Management  Alcohol-Tobacco     Smoking Status: quit     Year Quit: 2000  Caffeine-Diet-Exercise     Does Patient Exercise: yes     Type of exercise: WALKING/ DANCING    Exercise (avg: min/session):         Times/week:     3  Current Medications (verified): 1)  One Daily Womens  Tabs (Multiple Vitamins-Minerals) 2)  Metformin Hcl 1000 Mg Tabs (Metformin Hcl) .... Take 1 Tablet By Mouth Two Times A Day 3)  Glucotrol 10 Mg Tabs (Glipizide) .... Take One Tablet Once A  Day. 4)  Pravachol 80 Mg Tabs (Pravastatin Sodium) .... Take 1 Pill By Mouth Daily. 5)  Ultram 50 Mg Tabs (Tramadol Hcl) .... Take 1 Tablet By Mouth Every 8 Hours As Needed For Pain 6)  Cozaar 50 Mg Tabs (Losartan Potassium) .... Take 1 Pill By Mouth Daily. 7)  Hydrochlorothiazide 25 Mg Tabs (Hydrochlorothiazide) .... Take One Tablet Daily For Blood Pressure. 8)  Vitamin D (Ergocalciferol) 50000 Unit Caps (Ergocalciferol) .... Take One Tablet Weekly For 8 Weeks. 9)  Neurontin 300 Mg Caps (Gabapentin) .... Take 1 Pill By Mouth At Bedtime 10)  Lantus Solostar 100 Unit/ml Soln (Insulin Glargine) .... Inject 20 Units Under The Skin At Bedtime. 11)  Unifine Pentips 31g X 8 Mm Misc (Insulin Pen Needle) .... Use As Directed. 12)  Prilosec 40 Mg Cpdr (Omeprazole) .Marland KitchenMarland KitchenMarland Kitchen  Take 1 Pill By Mouth Two Times A Day  Allergies: No Known Drug Allergies  Review of Systems      See HPI  Physical Exam  Mouth:  pharynx pink and moist.   Lungs:  normal breath sounds, no crackles, and no wheezes.   Heart:  normal rate, regular rhythm, no murmur, and no gallop.   Extremities:  trace left pedal edema and trace right pedal edema.   Neurologic:  alert & oriented X3.     Impression & Recommendations:  Problem # 1:  NAUSEA (ICD-787.02) resolved.   Problem # 2:  HYPERTENSION (ICD-401.9)  BP slightly elevated today but was at goal last time. She is taking her meds regularly. WIll f/u in 2 wks.  Her updated medication list for this problem includes:    Cozaar 50 Mg Tabs (Losartan potassium) .Marland Kitchen... Take 1 pill by mouth daily.    Hydrochlorothiazide 25 Mg Tabs (Hydrochlorothiazide) .Marland Kitchen... Take one tablet daily  for blood pressure.  BP today: 140/94 Prior BP: 112/77 (11/06/2009)  Labs Reviewed: K+: 3.7 (10/19/2009) Creat: : 0.63 (10/19/2009)   Chol: 254 (09/30/2009)   HDL: 55 (09/30/2009)   LDL: 179 (09/30/2009)   TG: 101 (09/30/2009)  Orders: T-Lipid Profile ([id number]) T-Comprehensive Metabolic Panel ([id number])  Problem # 3:  HYPERLIPIDEMIA (ICD-272.4)  Will check FLP and CMET today.  Her updated medication list for this problem includes:    Pravachol 80 Mg Tabs (Pravastatin sodium) .Marland Kitchen... Take 1 pill by mouth daily.  Orders: T-Lipid Profile ([id number]) T-Comprehensive Metabolic Panel [id number])  Problem # 4:  DM (ICD-250.00) See HPI. Pt's CBG are in 200 on average and the lowest is in 160's. Will increase the dose of lantus to 25 untis and f/u in 2 wks.  Her updated medication list for this problem includes:    Metformin Hcl 1000 Mg Tabs (Metformin hcl) .Marland Kitchen... Take 1 tablet by mouth two times a day    Glucotrol 10 Mg Tabs (Glipizide) .Marland Kitchen... Take one tablet once a  day.    Cozaar 50 Mg Tabs (Losartan potassium) .Marland Kitchen... Take 1 pill by mouth daily.    Lantus Solostar 100 Unit/ml Soln (Insulin glargine) ..... Inject 25 units under the skin at bedtime.  Labs Reviewed: Creat: 0.63 (10/19/2009)    Reviewed HgBA1c results: 9.2 (09/30/2009)  8.0 (06/25/2009)  Complete Medication List: 1)  One Daily Womens Tabs (Multiple vitamins-minerals) 2)  Metformin Hcl 1000 Mg Tabs (Metformin hcl) .... Take 1 tablet by mouth two times a day 3)  Glucotrol 10 Mg Tabs (Glipizide) .... Take one tablet once a  day. 4)  Pravachol 80 Mg Tabs (Pravastatin sodium) .... Take 1 pill by mouth daily. 5)  Cozaar 50 Mg Tabs (Losartan potassium) .... Take 1 pill by mouth daily. 6)  Hydrochlorothiazide 25 Mg Tabs (Hydrochlorothiazide) .... Take one tablet daily for blood pressure. 7)  Vitamin D (ergocalciferol) 50000 Unit Caps (Ergocalciferol) .... Take one tablet weekly for 8 weeks. 8)  Neurontin 300 Mg Caps  (Gabapentin) .... Take 1 pill by mouth at bedtime 9)  Lantus Solostar 100 Unit/ml Soln (Insulin glargine) .... Inject 25 units under the skin at bedtime. 10)  Unifine Pentips 31g X 8 Mm Misc (Insulin pen needle) .... Use as directed. 11)  Prilosec 40 Mg Cpdr (Omeprazole) .... Take 1 pill by mouth two times a day  Patient Instructions: 1)  Please schedule a follow-up appointment in 2 weeks. 2)  Limit your Sodium (Salt) to less than 2 grams a day(slightly less than  1/2 a teaspoon) to prevent fluid retention, swelling, or worsening of symptoms. 3)  It is important that you exercise regularly at least 20 minutes 5 times a week. If you develop chest pain, have severe difficulty breathing, or feel very tired , stop exercising immediately and seek medical attention. 4)  You need to lose weight. Consider a lower calorie diet and regular exercise.  5)  Check your blood sugars regularly. If your readings are usually above : or below 70 you should contact our office. 6)  It is important that your Diabetic A1c level is checked every 3 months. 7)  See your eye doctor yearly to check for diabetic eye damage. 8)  Check your feet each night for sore areas, calluses or signs of infection. 9)  Check your Blood Pressure regularly. If it is above: you should make an appointment. Prescriptions: VITAMIN D (ERGOCALCIFEROL) 50000 UNIT CAPS (ERGOCALCIFEROL) Take one tablet weekly for 8 weeks.  #4 x 0   Entered and Authorized by:   Jason Coop MD   Signed by:   Jason Coop MD on 11/23/2009   Method used:   Faxed to ...       [facility] (retail)       [address] [place], [place]  [address]       Ph: [phone]       Fax: [phone]   RxID:   [id number] PRAVACHOL 80 MG TABS (PRAVASTATIN SODIUM) take 1 pill by mouth daily.  #30 x 0   Entered and Authorized by:   Jason Coop MD   Signed by:   Jason Coop MD on 11/23/2009   Method used:   Faxed to  ...       [facility] (retail)       [address] [place], [place]  [address]       Ph: [phone]       Fax: [phone]   RxID:   [id number] NEURONTIN 300 MG CAPS (GABAPENTIN) take 1 pill by mouth at bedtime  #30 x 2   Entered and Authorized by:   Jason Coop MD   Signed by:   Jason Coop MD on 11/23/2009   Method used:   Faxed to ...       [facility] (retail)       [address] [place], [place]  [address]       Ph: [phone]       Fax: [phone]   RxID:   [id number]  Process Orders Check Orders Results:     [facility]: ABN not required for this insurance Tests Sent for requisitioning (November 23, 2009 11:18 AM):     11/23/2009: [facility] -- T-Lipid Profile [id number] (signed)     11/23/2009: [facility] -- T-Comprehensive Metabolic Panel [id number] (signed)    Prevention & Chronic Care Immunizations   Influenza vaccine: Not documented   Influenza vaccine deferral: Deferred  (08/31/2009)   Influenza vaccine due: 02/11/2010    Tetanus booster: Not documented   Td booster deferral: Deferred  (08/31/2009)    Pneumococcal vaccine: Not documented   Pneumococcal vaccine deferral: Deferred  (08/31/2009)  Other Screening   Pap smear: Not documented    Mammogram: Not documented   Mammogram due: 06/13/2009   Smoking status: quit  (11/23/2009)  Diabetes Mellitus   HgbA1C: 9.2  (09/30/2009)  Eye exam: Not documented   Diabetic eye exam action/deferral: Ophthalmology referral  (08/31/2009)    Foot exam: yes  (06/11/2009)   Foot exam action/deferral: Do today   High risk foot: Not documented   Foot care education: Not documented    Urine microalbumin/creatinine ratio: 6.6  (09/30/2009)    Diabetes flowsheet reviewed?: Yes   Progress toward A1C goal: Unchanged  Lipids   Total Cholesterol: 254  (09/30/2009)   LDL: 179   (09/30/2009)   LDL Direct: Not documented   HDL: 55  (09/30/2009)   Triglycerides: 101  (09/30/2009)    SGOT (AST): 20  (09/30/2009)   SGPT (ALT): 47  (09/30/2009) CMP ordered    Alkaline phosphatase: 73  (09/30/2009)   Total bilirubin: 0.4  (09/30/2009)    Lipid flowsheet reviewed?: Yes   Progress toward LDL goal: Unchanged  Hypertension   Last Blood Pressure: 140 / 94  (11/23/2009)   Serum creatinine: 0.63  (10/19/2009)   Serum potassium 3.7  (10/19/2009) CMP ordered     Hypertension flowsheet reviewed?: Yes   Progress toward BP goal: Unchanged  Self-Management Support :   Personal Goals (by the next clinic visit) :     Personal A1C goal: 7  (04/03/2009)     Personal blood pressure goal: 140/90  (04/03/2009)     Personal LDL goal: 70  (04/03/2009)    Patient will work on the following items until the next clinic visit to reach self-care goals:     Medications and monitoring: take my medicines every day, check my blood sugar, check my blood pressure, bring all of my medications to every visit, weigh myself weekly, examine my feet every day  (11/23/2009)     Eating: drink diet soda or water instead of juice or soda, eat more vegetables, use fresh or frozen vegetables, eat foods that are low in salt, eat baked foods instead of fried foods, eat fruit for snacks and desserts, limit or avoid alcohol  (11/23/2009)     Activity: take a 30 minute walk every day  (11/23/2009)    Diabetes self-management support: Written self-care plan, Education handout, Resources for patients handout  (11/23/2009)   Diabetes care plan printed   Diabetes education handout printed   Last diabetes self-management training by diabetes educator: 10/19/2009    Hypertension self-management support: Written self-care plan, Education handout, Resources for patients handout  (11/23/2009)   Hypertension self-care plan printed.   Hypertension education handout printed    Lipid self-management support: Written  self-care plan, Education handout, Resources for patients handout  (11/23/2009)   Lipid self-care plan printed.   Lipid education handout printed      Resource handout printed.  Process Orders Check Orders Results:     [facility]: ABN not required for this insurance Tests Sent for requisitioning (November 23, 2009 11:18 AM):     11/23/2009: [facility] -- T-Lipid Profile [id number] (signed)     11/23/2009: [facility] -- T-Comprehensive Metabolic Panel [phone] (signed)

## 2010-07-15 NOTE — Miscellaneous (Signed)
Summary: Orders Update  Clinical Lists Changes  Problems: Added new problem of INADEQUATE MATERIAL RESOURCES (ICD-V60.2) Orders: Added new Referral order of Social Work Referral (Social ) - Signed 

## 2010-07-15 NOTE — Assessment & Plan Note (Signed)
Summary: Soc. Work   Social Work Evaluation Date  02/22/2010 Patient name [name]  Primary MD   : Whitney Post MD Social Worker's name : Batool Majid MSW- LCSW  Home [phone]    Cell phone: .  Marland Kitchen     Alternate phone: . Marland Kitchen       Individual making referral: Dr. Odis Luster  Primary Reason for Referral:     Assist with Disability Process or Referral to Voc. Rehab Comments 50 yo [place] American female visited with me today in my office with her sister.  Lives with husband/ daughter is attending college at [employer].  Patient with lumbar back pain/herniated disc/morbid obesity and other health concerns. Using wheelchair today and transfers with difficulty.   Visited with [name] today and will not qualify for orange card due to still receiving unemployment.  She will eventually qualify for our programs.   She is clean of drugs and alcohol for 4 years now. No hx of MH services or counseling.  Action taken by Social Work: 60 minutes spent with this patient explaining disability and medicaid process.  Showed her how to apply for disability on line.  Encouraged her to apply for disability while she is trying to resolve back issues. Discussed many many community resources with her including crisis resources, MAP, free dental clinic, food pantry.  Wrote referral letter so she can visit food pantry.  Has my card and knows my hours. SW as needed.

## 2010-07-15 NOTE — Progress Notes (Signed)
Summary: pharmacy put wrong directions on med/ hla  Phone Note From Pharmacy   Summary of Call: pharmacy calls to say during audit they realized when acyclovir 400mg  was filled the directions they put on bottle stated twice daily instead of three times daily #15, pt was seen in clinic  after she picked med up, [facility] must make clinic aware that wrong directions were put on bottle.  Initial call taken by: Marin Roberts RN,  March 02, 2010 12:12 PM  Follow-up for Phone Call        Noted. Follow-up by: Zoila Shutter MD,  March 02, 2010 4:36 PM

## 2010-07-15 NOTE — Medication Information (Signed)
Summary: OXYCODONE-ACETAMINOPHEN  OXYCODONE-ACETAMINOPHEN   Imported By: Margie Billet 02/23/2010 11:46:56  _____________________________________________________________________  External Attachment:    Type:   Image     Comment:   External Document

## 2010-07-15 NOTE — Progress Notes (Signed)
Summary: Pain  Phone Note Call from Patient   Caller: Patient Call For: Crystal Post MD Summary of Call: Call from pt desires results of X-rays. Said that she was supposed to be scheduled for an appointment.  Pt was given the appointment date for nrxt Friday.  Pt said that the Neurontin and the Ibuproben are not helping her pain.  Pt said that she was given Percocet and it only made hersleeping and when she awakened the pain returned in her hips.  Pt said that she has been unable to sleep due to the increased pain..  Pt said that she is also having some cramping in her legs and is unable to walk due to the pain.   Says the pain has radiated done to her toes as well.  Wants something to help her with the pain.  This is the same pain that she has been seen for. [name] RN  December 24, 2009 1:52 PM  Initial call taken by: [name] RN,  December 24, 2009 1:52 PM  Follow-up for Phone Call        I reviewed her records. Significant LB disease. Need surgery. Will need to be evaluated by Spine surgeon. she has Seen Dr. Farris Has in teh past-needs to make appt with him. Follow-up by: Julaine Fusi  DO,  December 25, 2009 4:28 PM  Additional Follow-up for Phone Call Additional follow up Details #1::        Pt said that she has caled Dr. Blenda Bridegroom office.  He will be out of the office until week.  Told pt she will need to see a Neurosurgeon.  Pt is uninsured.  Pt said that her pain medication is not helping. Additional Follow-up by: [name] RN,  December 25, 2009 4:42 PM    Additional Follow-up for Phone Call Additional follow up Details #2::    Spoke with patient will make referral to [facility] on Monday. Will send dose pak of steroids. Follow-up by: Julaine Fusi  DO,  December 25, 2009 5:10 PM  New/Updated Medications: PREDNISONE (PAK) 10 MG TABS (PREDNISONE) 6 day dose pak 60mg  start. Take as directed. Prescriptions: PREDNISONE (PAK) 10 MG TABS (PREDNISONE) 6 day dose pak 60mg  start. Take as directed.  #1  pak x 0   Entered and Authorized by:   Julaine Fusi  DO   Signed by:   Julaine Fusi  DO on 12/25/2009   Method used:   Electronically to        [facility] (retail)       [address]       [place], [place]  [address]       Ph: [phone]       Fax: [phone]   RxID:   [id number]

## 2010-07-15 NOTE — Letter (Signed)
Summary: BLOOD GLUCOSE 11-01-2009-11-30-2009  BLOOD GLUCOSE 11-01-2009-11-30-2009   Imported By: Margie Billet 12/01/2009 14:39:22  _____________________________________________________________________  External Attachment:    Type:   Image     Comment:   External Document

## 2010-07-15 NOTE — Assessment & Plan Note (Signed)
Summary: f/u & cough since lisinopril increased/pcp-pokharel/hla   Vital Signs:  Patient profile:   50 year old female Height:      64.5 inches (163.83 cm) Weight:      249.0 pounds (113.18 kg) BMI:     42.23 Temp:     98.6 degrees F (37.00 degrees C) oral Pulse rate:   83 / minute BP sitting:   177 / 90  (right arm)  Vitals Entered By: Stanton Kidney Ditzler RN (July 07, 2009 10:13 AM) Is Patient Diabetic? Yes Did you bring your meter with you today? Yes Pain Assessment Patient in pain? yes     Location: right leg Intensity: 5 Type: sharp Onset of pain  past 2 month - sees Dr Farris Has on Prednisone now. Nutritional Status BMI of > 30 = obese Nutritional Status Detail appetite good CBG Result 241  Have you ever been in a relationship where you felt threatened, hurt or afraid?denies   Does patient need assistance? Functional Status Self care Ambulation Normal Comments FU BP - has had nonproductive cough since change BP dosage. BP reck 10:20AM 155/84 - 80 right arm.   Primary Care Provider:  Jason Coop MD   History of Present Illness: This is a  year old woman with past medical history of   hyperlipidemia diabetes HTN Abdominal pain-hospitalized 11/10-neg CT A/P, cardiac enz, FOBT, h. pylori ab, UA, lipase, HIDA. Mild transaminitis-CT c/w hepatic steatosis  She is here for bp recheck after increased dose of lisinipril.  She reports that she has had a dry cough since the dose was changed.     Depression History:      The patient denies a depressed mood most of the day and a diminished interest in her usual daily activities.         Preventive Screening-Counseling & Management  Alcohol-Tobacco     Smoking Status: quit     Year Quit: 2000  Caffeine-Diet-Exercise     Does Patient Exercise: yes     Type of exercise: WALKING/ DANCING     Exercise (avg: min/session):         Times/week:     3  Allergies: No Known Drug Allergies   Impression &  Recommendations:  Problem # 1:  HYPERTENSION (ICD-401.9) BP unchanged with increased dose of lisinopril. cough since increased lisinopril to 10 mg from 5. will decrease back to 5 mg of lisinopril (keep due to dm protenuria) and add hctz.  Her updated medication list for this problem includes:    Lisinopril 5 Mg Tabs (Lisinopril) .Marland Kitchen... Take one tablet a day for kidney protection.    Hydrochlorothiazide 25 Mg Tabs (Hydrochlorothiazide) .Marland Kitchen... Take one tablet daily for blood pressure.  BP today: 177/90 (on recheck is 155/84) Prior BP: 157/88 (06/25/2009)  Labs Reviewed: K+: 4.1 (06/12/2009) Creat: : 0.64 (06/12/2009)   Chol: 187 (06/12/2009)   HDL: 33 (06/12/2009)   LDL: 131 (06/12/2009)   TG: 114 (06/12/2009)  Problem # 2:  DM (ICD-250.00)  a1c was 8.0 earlier this month at that time increased glipizide. she is on prednisone per ortho and cbg's have been higher than usual.  Is almost finished with course, so will not make any changes. she has started exercising! needs optho referal will have to call her with appointment as she is in a hurry today.   Her updated medication list for this problem includes:    Metformin Hcl 1000 Mg Tabs (Metformin hcl) .Marland Kitchen... Take 1 tablet by mouth two times a  day    Glucotrol 10 Mg Tabs (Glipizide) .Marland Kitchen... Take one tablet two times a day.    Lisinopril 5 Mg Tabs (Lisinopril) .Marland Kitchen... Take one tablet a day for kidney protection.    Her updated medication list for this problem includes:    Metformin Hcl 1000 Mg Tabs (Metformin hcl) .Marland Kitchen... Take 1 tablet by mouth two times a day    Glucotrol 10 Mg Tabs (Glipizide) .Marland Kitchen... Take one tablet two times a day.    Lisinopril 10 Mg Tabs (Lisinopril) .Marland Kitchen... Take one tablet daily for blood pressure.  Labs Reviewed: Creat: 0.64 (06/12/2009)    Reviewed HgBA1c results: 8.0 (06/25/2009)  7.5 (04/03/2009)  Orders: Ophthalmology Referral (Ophthalmology)  Problem # 3:  OBESITY, MORBID (ICD-278.01) has started going to the  gym!! and has lost one lb in two weeks.  Problem # 4:  HYPERLIPIDEMIA (ICD-272.4) need to recheck lipids and cmet to look for effect.  do this in 2 weeks at next appt.  Her updated medication list for this problem includes:    Pravachol 20 Mg Tabs (Pravastatin sodium) .Marland Kitchen... Take 1 pill by mouth daily.  Labs Reviewed: SGOT: 99 (06/12/2009)   SGPT: 76 (06/12/2009)   HDL:33 (06/12/2009), 34 (04/03/2009)  LDL:131 (06/12/2009), 174 (04/03/2009)  Chol:187 (06/12/2009), 239 (04/03/2009)  Trig:114 (06/12/2009), 154 (04/03/2009)  Her updated medication list for this problem includes:    Pravachol 20 Mg Tabs (Pravastatin sodium) .Marland Kitchen... Take 1 pill by mouth daily.  Problem # 5:  VITAMIN D DEFICIENCY (ICD-268.9) is on 50000 units weekly by ob/gyn  Complete Medication List: 1)  One Daily Womens Tabs (Multiple vitamins-minerals) 2)  Metformin Hcl 1000 Mg Tabs (Metformin hcl) .... Take 1 tablet by mouth two times a day 3)  Nexium 40 Mg Cpdr (Esomeprazole magnesium) .... Take 1 tablet by mouth once a day 4)  Glucotrol 10 Mg Tabs (Glipizide) .... Take one tablet two times a day. 5)  Pravachol 20 Mg Tabs (Pravastatin sodium) .... Take 1 pill by mouth daily. 6)  Ultram 50 Mg Tabs (Tramadol hcl) .... Take 1 tablet by mouth every 8 hours as needed for pain 7)  Lisinopril 5 Mg Tabs (Lisinopril) .... Take one tablet a day for kidney protection. 8)  Hydrochlorothiazide 25 Mg Tabs (Hydrochlorothiazide) .... Take one tablet daily for blood pressure. 9)  Vitamin D (ergocalciferol) 50000 Unit Caps (Ergocalciferol) .... Take one tablet weekly for 8 weeks.  Other Orders: Capillary Blood Glucose/CBG (04540)  Patient Instructions: 1)  Please schedule a follow-up appointment in 2 weeks for blood pressure recheck and lab work. 2)  You have a new medication for blood pressure. 3)  You should take lisinopril 5 mg daily for kidney protection and blood pressure. Prescriptions: LISINOPRIL 5 MG TABS (LISINOPRIL) Take  one tablet a day for kidney protection.  #32 x 3   Entered and Authorized by:   Elby Showers MD   Signed by:   Elby Showers MD on 07/07/2009   Method used:   Electronically to        [facility] [address] [phone]* (retail)       [address]       [place], [place]  [id number]       Ph: [phone] or [phone]       Fax: [phone]   RxID:   [id number] HYDROCHLOROTHIAZIDE 25 MG TABS (HYDROCHLOROTHIAZIDE) Take one tablet daily for blood pressure.  #32 x 3   Entered and  Authorized by:   Elby Showers MD   Signed by:   Elby Showers MD on 07/07/2009   Method used:   Print then Give to Patient   RxID:   [id number]    Prevention & Chronic Care Immunizations   Influenza vaccine: Not documented   Influenza vaccine due: 02/11/2010    Tetanus booster: Not documented    Pneumococcal vaccine: Not documented  Other Screening   Pap smear: Not documented    Mammogram: Not documented   Mammogram due: 06/13/2009   Smoking status: quit  (07/07/2009)  Diabetes Mellitus   HgbA1C: 8.0  (06/25/2009)    Eye exam: Not documented   Diabetic eye exam action/deferral: Ophthalmology referral  (07/07/2009)    Foot exam: yes  (06/11/2009)   Foot exam action/deferral: Do today   High risk foot: Not documented   Foot care education: Not documented    Urine microalbumin/creatinine ratio: 4.2  (04/03/2009)    Diabetes flowsheet reviewed?: Yes   Progress toward A1C goal: Unchanged  Lipids   Total Cholesterol: 187  (06/12/2009)   LDL: 131  (06/12/2009)   LDL Direct: Not documented   HDL: 33  (06/12/2009)   Triglycerides: 114  (06/12/2009)    SGOT (AST): 99  (06/12/2009)   SGPT (ALT): 76  (06/12/2009)   Alkaline phosphatase: 65  (06/12/2009)   Total bilirubin: 0.3  (06/12/2009)    Lipid flowsheet reviewed?: Yes   Progress toward LDL goal: Unchanged  Hypertension   Last Blood Pressure: 177 / 90  (07/07/2009)   Serum creatinine: 0.64   (06/12/2009)   Serum potassium 4.1  (06/12/2009)    Hypertension flowsheet reviewed?: Yes   Progress toward BP goal: Unchanged  Self-Management Support :   Personal Goals (by the next clinic visit) :     Personal A1C goal: 7  (04/03/2009)     Personal blood pressure goal: 140/90  (04/03/2009)     Personal LDL goal: 70  (04/03/2009)    Patient will work on the following items until the next clinic visit to reach self-care goals:     Medications and monitoring: take my medicines every day, check my blood sugar, check my blood pressure, bring all of my medications to every visit, weigh myself weekly, examine my feet every day  (07/07/2009)     Eating: drink diet soda or water instead of juice or soda, eat more vegetables, use fresh or frozen vegetables, eat foods that are low in salt, eat baked foods instead of fried foods, eat fruit for snacks and desserts, limit or avoid alcohol  (07/07/2009)     Activity: take a 30 minute walk every day  (07/07/2009)    Diabetes self-management support: Copy of home glucose meter record, Written self-care plan  (06/11/2009)   Last diabetes self-management training by diabetes educator: 01/08/2009    Hypertension self-management support: Written self-care plan  (06/11/2009)    Lipid self-management support: Written self-care plan  (06/11/2009)    Nursing Instructions: Refer for screening diabetic eye exam (see order)

## 2010-07-15 NOTE — Assessment & Plan Note (Signed)
Summary: [name]   Vital Signs:  Patient profile:   50 year old female Height:      64.5 inches Weight:      243.3 pounds BMI:     41.27 Temp:     97.4 degrees F oral Pulse rate:   90 / minute BP sitting:   112 / 77  (right arm)  Vitals Entered By: Filomena Jungling NT II (Nov 06, 2009 10:03 AM) CC: FEELS LIKE INSULIN IS MAKING HER SICK Is Patient Diabetic? Yes Did you bring your meter with you today? Yes Pain Assessment Patient in pain? yes     Location: THROAT, LEGS, HEADACHE Intensity: 8 Type: aching Onset of pain  SINCE TUESDAY Nutritional Status BMI of > 30 = obese  Does patient need assistance? Functional Status Self care Ambulation Normal   Primary Care Provider:  Jason Coop MD  CC:  FEELS LIKE INSULIN IS MAKING HER SICK.  History of Present Illness: [name] is a 50 yo lady with PMH as outlined in the EMR comes today for a f/u visit.   1. DM: She started taking lantus 15 units once a day, glipizide 10 mg once a day and metformin 1000 mg two times a day. She checks CBG two times a day and it usu runs little higher than 200. Her highest CBg is 327 and lowest is 172. Her blurry vision has improved. She last saw an eye MD on 12/10 and she was told her eyes were fine then.   2. HTN: She is taking all her pills without any problem.   3. HL: She started taking pravastatin 80 mg daily.   4. Cough: It is resolved.   5. Nausea: She also started to feel sick after taking insulin. No vomiting. She recently visited her son, who is in jail in [place]. She also has epigastric abdominal pain. She also has pyrosis. She has no heartburn.    Preventive Screening-Counseling & Management  Alcohol-Tobacco     Smoking Status: quit     Year Quit: 2000  Caffeine-Diet-Exercise     Does Patient Exercise: yes     Type of exercise: WALKING/ DANCING     Exercise (avg: min/session):         Times/week:     3  Current Medications  (verified): 1)  One Daily Womens  Tabs (Multiple Vitamins-Minerals) 2)  Metformin Hcl 1000 Mg Tabs (Metformin Hcl) .... Take 1 Tablet By Mouth Two Times A Day 3)  Glucotrol 10 Mg Tabs (Glipizide) .... Take One Tablet Once A  Day. 4)  Pravachol 80 Mg Tabs (Pravastatin Sodium) .... Take 1 Pill By Mouth Daily. 5)  Ultram 50 Mg Tabs (Tramadol Hcl) .... Take 1 Tablet By Mouth Every 8 Hours As Needed For Pain 6)  Cozaar 50 Mg Tabs (Losartan Potassium) .... Take 1 Pill By Mouth Daily. 7)  Hydrochlorothiazide 25 Mg Tabs (Hydrochlorothiazide) .... Take One Tablet Daily For Blood Pressure. 8)  Vitamin D (Ergocalciferol) 50000 Unit Caps (Ergocalciferol) .... Take One Tablet Weekly For 8 Weeks. 9)  Neurontin 300 Mg Caps (Gabapentin) .... Take 1 Pill By Mouth At Bedtime 10)  Cetirizine Hcl 10 Mg Tabs (Cetirizine Hcl) .... Take 1 Pill By Mouth Daily As Needed For Allergy 11)  Lantus Solostar 100 Unit/ml Soln (Insulin Glargine) .... Inject 20 Units Under The Skin At Bedtime. 12)  Unifine Pentips 31g X 8 Mm Misc (Insulin Pen Needle) .... Use As Directed. 13)  Prilosec 40  Mg Cpdr (Omeprazole) .... Take 1 Pill By Mouth Two Times A Day  Allergies: No Known Drug Allergies  Review of Systems      See HPI  Physical Exam  Mouth:  pharynx pink and moist, no erythema, and no exudates.   Lungs:  normal breath sounds, no crackles, and no wheezes.   Heart:  normal rate, regular rhythm, no murmur, no gallop, and no rub.   Abdomen:  soft, non-tender, normal bowel sounds, and no distention.   Extremities:  trace left pedal edema and trace right pedal edema.   Neurologic:  alert & oriented X3.     Impression & Recommendations:  Problem # 1:  COUGH (ICD-786.2) Resolved.   Problem # 2:  HYPERTENSION (ICD-401.9) BP at goal.  Her updated medication list for this problem includes:    Cozaar 50 Mg Tabs (Losartan potassium) .Marland Kitchen... Take 1 pill by mouth daily.    Hydrochlorothiazide 25 Mg Tabs (Hydrochlorothiazide)  .Marland Kitchen... Take one tablet daily for blood pressure.  BP today: 112/77 Prior BP: 129/88 (10/19/2009)  Labs Reviewed: K+: 3.7 (10/19/2009) Creat: : 0.63 (10/19/2009)   Chol: 254 (09/30/2009)   HDL: 55 (09/30/2009)   LDL: 179 (09/30/2009)   TG: 101 (09/30/2009)  Problem # 3:  HYPERLIPIDEMIA (ICD-272.4) Wil cont same. She just started taking the increased dose of pravastatin for 2 wks. Plan is to check FLP and CMET on next appt.  Her updated medication list for this problem includes:    Pravachol 80 Mg Tabs (Pravastatin sodium) .Marland Kitchen... Take 1 pill by mouth daily.  Problem # 4:  DM (ICD-250.00) See HPI. Her home CBG is higher than 200 on average. I will increase the lantus to 20 units and also provided 2 samples of solostor pen insulin. She is working with [facility] program to get lantus. I encouraged her to check her CBG three times a day and f/u in 2 wks. Her blurry vision has resolved and she was told on 12/10 by her optho that her eyes are not affected with DM. Since her symptoms of blurry vision are transient, these are probably related to hyperglycemia per se rather than permanent damage. So unless these symptoms recur, plan is to f/u with optho in a year.  Her updated medication list for this problem includes:    Metformin Hcl 1000 Mg Tabs (Metformin hcl) .Marland Kitchen... Take 1 tablet by mouth two times a day    Glucotrol 10 Mg Tabs (Glipizide) .Marland Kitchen... Take one tablet once a  day.    Cozaar 50 Mg Tabs (Losartan potassium) .Marland Kitchen... Take 1 pill by mouth daily.    Lantus Solostar 100 Unit/ml Soln (Insulin glargine) ..... Inject 20 units under the skin at bedtime.  Problem # 5:  NAUSEA (ICD-787.02) I think this is related to gastritis. Plan is to try two times a day PPI for 2 wks. If she responds, plan is to change it to once a day.   Complete Medication List: 1)  One Daily Womens Tabs (Multiple vitamins-minerals) 2)  Metformin Hcl 1000 Mg Tabs (Metformin hcl) .... Take 1 tablet by mouth two times a day 3)  Glucotrol  10 Mg Tabs (Glipizide) .... Take one tablet once a  day. 4)  Pravachol 80 Mg Tabs (Pravastatin sodium) .... Take 1 pill by mouth daily. 5)  Ultram 50 Mg Tabs (Tramadol hcl) .... Take 1 tablet by mouth every 8 hours as needed for pain 6)  Cozaar 50 Mg Tabs (Losartan potassium) .... Take 1 pill by mouth  daily. 7)  Hydrochlorothiazide 25 Mg Tabs (Hydrochlorothiazide) .... Take one tablet daily for blood pressure. 8)  Vitamin D (ergocalciferol) 50000 Unit Caps (Ergocalciferol) .... Take one tablet weekly for 8 weeks. 9)  Neurontin 300 Mg Caps (Gabapentin) .... Take 1 pill by mouth at bedtime 10)  Cetirizine Hcl 10 Mg Tabs (Cetirizine hcl) .... Take 1 pill by mouth daily as needed for allergy 11)  Lantus Solostar 100 Unit/ml Soln (Insulin glargine) .... Inject 20 units under the skin at bedtime. 12)  Unifine Pentips 31g X 8 Mm Misc (Insulin pen needle) .... Use as directed. 13)  Prilosec 40 Mg Cpdr (Omeprazole) .... Take 1 pill by mouth two times a day  Patient Instructions: 1)  Please schedule a follow-up appointment in 2 weeks. 2)  Limit your Sodium (Salt) to less than 2 grams a day(slightly less than 1/2 a teaspoon) to prevent fluid retention, swelling, or worsening of symptoms. 3)  It is important that you exercise regularly at least 20 minutes 5 times a week. If you develop chest pain, have severe difficulty breathing, or feel very tired , stop exercising immediately and seek medical attention. 4)  You need to lose weight. Consider a lower calorie diet and regular exercise.  5)  It is important that your Diabetic A1c level is checked every 3 months. 6)  See your eye doctor yearly to check for diabetic eye damage. 7)  Check your Blood Pressure regularly. If it is above: you should make an appointment. Prescriptions: PRILOSEC 40 MG CPDR (OMEPRAZOLE) take 1 pill by mouth two times a day  #0 x 0   Entered and Authorized by:   Jason Coop MD   Signed by:   Jason Coop MD on  11/06/2009   Method used:   Faxed to ...       [facility] (retail)       [address] [place], [place]  [address]       Ph: [phone]       Fax: [phone]   RxID:   [id number]   Prevention & Chronic Care Immunizations   Influenza vaccine: Not documented   Influenza vaccine deferral: Deferred  (08/31/2009)   Influenza vaccine due: 02/11/2010    Tetanus booster: Not documented   Td booster deferral: Deferred  (08/31/2009)    Pneumococcal vaccine: Not documented   Pneumococcal vaccine deferral: Deferred  (08/31/2009)  Other Screening   Pap smear: Not documented    Mammogram: Not documented   Mammogram due: 06/13/2009   Smoking status: quit  (11/06/2009)  Diabetes Mellitus   HgbA1C: 9.2  (09/30/2009)    Eye exam: Not documented   Diabetic eye exam action/deferral: Ophthalmology referral  (08/31/2009)    Foot exam: yes  (06/11/2009)   Foot exam action/deferral: Do today   High risk foot: Not documented   Foot care education: Not documented    Urine microalbumin/creatinine ratio: 6.6  (09/30/2009)    Diabetes flowsheet reviewed?: Yes   Progress toward A1C goal: Unchanged  Lipids   Total Cholesterol: 254  (09/30/2009)   LDL: 179  (09/30/2009)   LDL Direct: Not documented   HDL: 55  (09/30/2009)   Triglycerides: 101  (09/30/2009)    SGOT (AST): 20  (09/30/2009)   SGPT (ALT): 47  (09/30/2009)   Alkaline phosphatase: 73  (09/30/2009)   Total bilirubin: 0.4  (09/30/2009)    Lipid flowsheet reviewed?: Yes   Progress toward LDL goal: Unchanged  Hypertension   Last Blood Pressure: 112 / 77  (11/06/2009)   Serum creatinine: 0.63  (10/19/2009)   Serum potassium 3.7  (10/19/2009)    Hypertension flowsheet reviewed?: Yes   Progress toward BP goal: At goal  Self-Management Support :   Personal Goals (by the next clinic visit) :     Personal A1C goal: 7  (04/03/2009)     Personal blood pressure goal: 140/90  (04/03/2009)      Personal LDL goal: 70  (04/03/2009)    Patient will work on the following items until the next clinic visit to reach self-care goals:     Medications and monitoring: examine my feet every day  (11/06/2009)     Eating: drink diet soda or water instead of juice or soda, eat more vegetables, use fresh or frozen vegetables, eat foods that are low in salt, eat baked foods instead of fried foods, eat fruit for snacks and desserts, limit or avoid alcohol  (10/19/2009)     Activity: park at the far end of the parking lot  (10/19/2009)    Diabetes self-management support: Written self-care plan  (11/06/2009)   Diabetes care plan printed   Last diabetes self-management training by diabetes educator: 10/19/2009    Hypertension self-management support: Written self-care plan  (11/06/2009)   Hypertension self-care plan printed.    Lipid self-management support: Written self-care plan  (11/06/2009)   Lipid self-care plan printed.

## 2010-07-15 NOTE — Progress Notes (Signed)
Summary: Surgical Clearance  Phone Note Call from Patient   Caller: Patient Call For: Jason Coop MD Summary of Call: Call from pt wants to know about getting a Surgical Clearance for GYN Surgery.  Message left for pt to call the Clinics about getting the Clearance.   Says that she was just seen and will that be enough.  [name] RN  June 26, 2009 11:17 AM  Initial call taken by: [name] RN,  June 26, 2009 11:17 AM  Follow-up for Phone Call        RTC from and to pt.  Message left for the pt to contact the Surgeons off to see if they have a form or would like a letter frm [place] to clear her for surgery. Follow-up by: Jason Coop MD,  June 27, 2009 10:53 PM  Additional Follow-up for Phone Call Additional follow up Details #1::        Pt needs to be seen in our office for the surgical clearance. What kind of surgery is she undergoing?     Appended Document: Surgical Clearance Pt is scheduled to have Tonsils out on 07/09/2009 by Dr. Ezzard Standing.   Her Ovaries have a cyst that will need to be removed.  May have to have her Fallopian Tubes removed at some point.  Pt not sure when she will have the GYN surgery done.  [name]. RN June 30, 2009 2:28 PM

## 2010-07-15 NOTE — Assessment & Plan Note (Signed)
Summary: DM TRAINING/VS   Vital Signs:  Patient profile:   50 year old female Weight:      244.8 pounds BMI:     41.52 Is Patient Diabetic? Yes Did you bring your meter with you today? Yes   Allergies: No Known Drug Allergies   Complete Medication List: 1)  One Daily Womens Tabs (Multiple vitamins-minerals) 2)  Metformin Hcl 1000 Mg Tabs (Metformin hcl) .... Take 1 tablet by mouth two times a day 3)  Nexium 40 Mg Cpdr (Esomeprazole magnesium) .... Take 1 tablet by mouth once a day 4)  Glucotrol 10 Mg Tabs (Glipizide) .... Take one tablet two times a day. 5)  Pravachol 40 Mg Tabs (Pravastatin sodium) .... Take 1 pill by mouth daily. 6)  Ultram 50 Mg Tabs (Tramadol hcl) .... Take 1 tablet by mouth every 8 hours as needed for pain 7)  Cozaar 50 Mg Tabs (Losartan potassium) .... Take 1 pill by mouth daily. 8)  Hydrochlorothiazide 25 Mg Tabs (Hydrochlorothiazide) .... Take one tablet daily for blood pressure. 9)  Vitamin D (ergocalciferol) 50000 Unit Caps (Ergocalciferol) .... Take one tablet weekly for 8 weeks. 10)  Neurontin 300 Mg Caps (Gabapentin) .... Take 1 pill by mouth at bedtime 11)  Cozaar 50 Mg Tabs (Losartan potassium) .... Take 1 pill by mouth daily.  Other Orders: DSMT(Medicare) Individual, 30 Minutes (G0108)  Patient Instructions: 1)  Today I learned that: 2)  short term insulin with pen instead of needle if needed( not as bad as I thought)  3)  to try to get my sugars under control mostly less than< 160, but one or two over 200 okay 4)  Exercise will burn up a lot of sugars,  5)  Maybe with sugars better controlled diet and exercsie I can decrease medicine- (glipizide) to helop me not have shakes and lose weight 6)  I can have 65 grams carb each meal, three times a day with small snacks between if needed( 15-30 grams a snack) total about 200 grams carb/day. (try not to skip lunch)   Diabetes Self Management Training  PCP: Jason Coop MD Date diagnosed with  diabetes: 06/13/2005 Diabetes Type: Type 2 non-insulin Other persons present: no Current smoking Status: quit  Vital Signs Todays Weight: 244.8lb  in BMI 41.52in-lbs   Diabetes Medications:  Comments: accucheck meter downloaded- pays cash for strips- gave her number for accucheck strip assistance. CBGs are trending downl nicely form patient efforts have gone form  ~ 200- 250 inFebruary to 150-250 range.pateint identifeid skipping lunch as problematic to overeating later in day.    Monitoring Self monitoring blood glucose 2 times a day Name of Meter  Pt OWN DEVICE  Recent Episodes of: Requiring Help from another person  Hyperglycemia : No Hypoglycemia: Yes Severe Hypoglycemia : No      Nutrition assessment Biggest challenge to eating healthy: Skipping meals Diabetes Disease Process  Discussed today State own type of diabetes: Demonstrates competency Medications State name-action-dose-duration-side effects-and time to take medication: Demonstrates competency   State appropriate timing of food related to medication: Demonstrates competencyDemonstrates/verbalizes site selection and rotation for injections Needs review/assistance   Correctly draw up and administer insulin-Byetta-Symlin-glucagon: Needs review/assistance   Describe safe needle/lancet disposal: Demonstrates competency Nutritional Management Identify what foods most often affect blood glucose: Demonstrates competency    Verbalize importance of controlling food portions: Demonstrates competency   State importance of spacing and not omitting meals and snacks: Demonstrates competency   State changes planned for home meals/snacks:  Insurance account manager purpose and frequency of monitoring BG-ketones-HgbA1C  : Demonstrates competency   Perform glucose monitoring/ketone testing and record results correctly: Demonstrates competencyState target blood glucose and HgbA1C goals: Demonstrates  competency    Complications State the causes-signs and symptoms and prevention of Hyperglycemia: Demonstrates competency   Explain proper treatment of hyperglycemia: Demonstrates competency   State the causes- signs and symptoms and prevention of hypoglycemia: Demonstrates competency   Explain proper treatment of hypoglycemia: Demonstrates competency    Exercise States effect of exercise on blood glucose: Demonstrates competency    Lifestyle changes:Goal setting and Problem solving Develop strategies to reduce risk factors: Needs review/assistance   Verbalize need for and frequency of health care follow-up: Needs review/assistance   Diabetes Management Education Done: 08/18/2009    BEHAVIORAL GOALS INITIAL Incorporating appropriate nutritional management: see instructions        Diabetes Self Management Support: family, church and clinic staff, aunt, doctor's appointments Follow-up:48month

## 2010-07-15 NOTE — Assessment & Plan Note (Signed)
Summary: DIABETES TEACHING ADD PER KAYE/CFB   Allergies: No Known Drug Allergies   Complete Medication List: 1)  One Daily Womens Tabs (Multiple vitamins-minerals) 2)  Metformin Hcl 1000 Mg Tabs (Metformin hcl) .... Take 1 tablet by mouth two times a day 3)  Omeprazole 40 Mg Cpdr (Omeprazole) .... Take 1 pill by mouth two times a day. 4)  Glucotrol 10 Mg Tabs (Glipizide) .... Take one tablet two times a day. 5)  Pravachol 80 Mg Tabs (Pravastatin sodium) .... Take 1 pill by mouth daily. 6)  Ultram 50 Mg Tabs (Tramadol hcl) .... Take 1 tablet by mouth every 8 hours as needed for pain 7)  Cozaar 50 Mg Tabs (Losartan potassium) .... Take 1 pill by mouth daily. 8)  Hydrochlorothiazide 25 Mg Tabs (Hydrochlorothiazide) .... Take one tablet daily for blood pressure. 9)  Vitamin D (ergocalciferol) 50000 Unit Caps (Ergocalciferol) .... Take one tablet weekly for 8 weeks. 10)  Neurontin 300 Mg Caps (Gabapentin) .... Take 1 pill by mouth at bedtime 11)  Cetirizine Hcl 10 Mg Tabs (Cetirizine hcl) .... Take 1 pill by mouth daily as needed for allergy 12)  Lantus Solostar 100 Unit/ml Soln (Insulin glargine) .... Inject 15 units under the skin at bedtime. 13)  Unifine Pentips 31g X 8 Mm Misc (Insulin pen needle) .... Use as directed.  Other Orders: DSMT(Medicare) Individual, 30 Minutes (G0108)  Diabetes Self Management Training  PCP: Jason Coop MD Date diagnosed with diabetes: 06/13/2005 Diabetes Type: Type 2 non-insulin Other persons present: daughter- henrietta Current smoking Status: quit  Diabetes Medications:  Comments: starting insulin today- patient understands that insulin pen given to patient today by physician- is expensive if she has to purchase it out of pocket. We discussed vial and syringe option and NPH as less expensvie alternative t the lantus if needed. 15 pen needles provided to patient today and instruction on how to use pen needle. she gae injection before with  Victoza, so did not make her self inject.  Long Acting  Insulin Type:Lantus  Bedtime Dose: 15 units    Monitoring Self monitoring blood glucose 2 times a day Name of Meter  Pt OWN DEVICE  Time of Testing  Before Breakfast     Other # of Carbs/Grams ice cream, cake, candy  Nutrition assessment Biggest challenge to eating healthy: high blood sugar hunger nad afraid of low blood sugars- working on more confidence in ability to handle lows &  highs  Activity Limitations  Appropriate physical activity Diabetes Disease Process  Discussed today  Medications State name-action-dose-duration-side effects-and time to take medication: Demonstrates competency   State appropriate timing of food related to medication: Demonstrates competency   Demonstrates/verbalizes site selection and rotation for injections Demonstrates competency   Correctly draw up and administer insulin-Byetta-Symlin-glucagon: Demonstrates competency   Describe safe needle/lancet disposal: Theatre manager purpose and frequency of monitoring BG-ketones-HgbA1C  : Demonstrates competency    Complications State the causes-signs and symptoms and prevention of Hyperglycemia: Demonstrates competency   Explain proper treatment of hyperglycemia: Demonstrates competency   State the causes- signs and symptoms and prevention of hypoglycemia: Demonstrates competency   Explain proper treatment of hypoglycemia: Demonstrates competency    Exercise  Lifestyle changes:Goal setting and Problem solving Identify Family/SO role in managing diabetes: Demonstrates competency   Diabetes Management Education Done: 10/19/2009    BEHAVIORAL GOALS INITIAL Utilizing medications if for therapeutic effectiveness: inject insulin daily    BEHAVIORAL GOAL FOLLOW UP Utilizing  medications if for therapeutic effectiveness: Most of the time Incorporating appropriate nutritional  management: Sometimes Specific goal set today: starting insulin       Diabetes Self Management Support: family, church and clinic staff, aunt, doctor's appointments Follow-up:34month

## 2010-07-15 NOTE — Letter (Signed)
Summary: DIABETES LOGBOOK REPORT 06/23-07/22/2011  DIABETES LOGBOOK REPORT 06/23-07/22/2011   Imported By: Shon Hough 01/20/2010 16:20:38  _____________________________________________________________________  External Attachment:    Type:   Image     Comment:   External Document

## 2010-07-15 NOTE — Progress Notes (Signed)
Summary: Refill  Phone Note Refill Request Message from:  Patient on June 29, 2010 9:24 AM  Refills Requested: Medication #1:  OXYCODONE-ACETAMINOPHEN 5-325 MG TABS Take one pill by mouth every 4 hours as needed for pain.   Dosage confirmed as above?Dosage Confirmed   Last Refilled: 11/16 Initial call taken by: Marin Roberts RN,  June 29, 2010 9:24 AM  Additional Follow-up for Phone Call Additional follow up Details #1::        Rx given to patient Additional Follow-up by: Marin Roberts RN,  June 30, 2010 2:00 PM    Prescriptions: OXYCODONE-ACETAMINOPHEN 5-325 MG TABS (OXYCODONE-ACETAMINOPHEN) Take one pill by mouth every 4 hours as needed for pain.  #120 x 0   Entered and Authorized by:   Whitney Post MD   Signed by:   Whitney Post MD on 06/29/2010   Method used:   Reprint   RxID:   [id number] OXYCODONE-ACETAMINOPHEN 5-325 MG TABS (OXYCODONE-ACETAMINOPHEN) Take one pill by mouth every 4 hours as needed for pain.  #120 x 0   Entered by:   Whitney Post MD   Authorized by:   Marin Roberts RN   Signed by:   Whitney Post MD on 06/29/2010   Method used:   Print then Give to Patient   RxID:   [id number]

## 2010-07-15 NOTE — Assessment & Plan Note (Signed)
Summary: cbg> 200, bp 140/90, has blurred vision/pcp-pokharel/ hla   Vital Signs:  Patient profile:   50 year old female Height:      64.5 inches (163.83 cm) Weight:      244.7 pounds (111.23 kg) BMI:     41.50 Temp:     97.7 degrees F (36.50 degrees C) oral Pulse rate:   96 / minute BP sitting:   129 / 88  (right arm)  Vitals Entered By: Stanton Kidney Ditzler RN (Oct 19, 2009 2:16 PM) Is Patient Diabetic? Yes Did you bring your meter with you today? Yes Pain Assessment Patient in pain? no      Nutritional Status BMI of > 30 = obese Nutritional Status Detail appetite good CBG Result 344  Have you ever been in a relationship where you felt threatened, hurt or afraid?denies   Does patient need assistance? Functional Status Self care Ambulation Normal Comments Daughter with pt. CBG and BP are up since last visit. Blurred vision. Severe h/a 10/18/09. [place].   Primary Care Provider:  Jason Coop MD   History of Present Illness: 50 year old with Past Medical History: hyperlipidemia diabetes HTN Abdominal pain-hospitalized 11/10-neg CT A/P, cardiac enz, FOBT, h. pylori ab, UA, lipase, HIDA. Mild transaminitis-CT c/w hepatic steatosis  She presents today because her blood sugar is high. Since her lat visit her blood sugar has not decreased. She has tried to do diet, but when she is stress out she eats more.  She is also complaining of blurry vission since  2 weeks ago a little worse this week. She is also complaining of headaches since 1 week ago. She denies headaches now. temporal area, 8/10, pain get better with aleve and rest. no weakness. She denies numbness, , speech problem.  She is having a lot stress because her son is in jail.  Her blood sugar has been high in the 250- 300 range,   Depression History:      The patient denies a depressed mood most of the day and a diminished interest in her usual daily activities.         Preventive Screening-Counseling &  Management  Alcohol-Tobacco     Smoking Status: quit     Year Quit: 2000  Caffeine-Diet-Exercise     Does Patient Exercise: yes     Type of exercise: WALKING/ DANCING     Exercise (avg: min/session):         Times/week:     3  Current Medications (verified): 1)  One Daily Womens  Tabs (Multiple Vitamins-Minerals) 2)  Metformin Hcl 1000 Mg Tabs (Metformin Hcl) .... Take 1 Tablet By Mouth Two Times A Day 3)  Glucotrol 10 Mg Tabs (Glipizide) .... Take One Tablet Two Times A Day. 4)  Pravachol 80 Mg Tabs (Pravastatin Sodium) .... Take 1 Pill By Mouth Daily. 5)  Ultram 50 Mg Tabs (Tramadol Hcl) .... Take 1 Tablet By Mouth Every 8 Hours As Needed For Pain 6)  Cozaar 50 Mg Tabs (Losartan Potassium) .... Take 1 Pill By Mouth Daily. 7)  Hydrochlorothiazide 25 Mg Tabs (Hydrochlorothiazide) .... Take One Tablet Daily For Blood Pressure. 8)  Vitamin D (Ergocalciferol) 50000 Unit Caps (Ergocalciferol) .... Take One Tablet Weekly For 8 Weeks. 9)  Neurontin 300 Mg Caps (Gabapentin) .... Take 1 Pill By Mouth At Bedtime 10)  Cetirizine Hcl 10 Mg Tabs (Cetirizine Hcl) .... Take 1 Pill By Mouth Daily As Needed For Allergy  Allergies: No Known Drug Allergies  Review  of Systems  The patient denies fever, chest pain, syncope, dyspnea on exertion, peripheral edema, prolonged cough, headaches, hemoptysis, abdominal pain, melena, hematochezia, and severe indigestion/heartburn.    Physical Exam  General:  alert, well-developed, and well-nourished.   Head:  normocephalic and atraumatic.   Eyes:  vision grossly intact, pupils equal, pupils round, pupils reactive to light, and pupils react to accomodation.   Mouth:  pharynx pink and moist.   Lungs:  normal respiratory effort, no intercostal retractions, and no accessory muscle use.   Heart:  normal rate and regular rhythm.   Extremities:  no edema.  Neurologic:  alert & oriented X3, cranial nerves II-XII intact, strength normal in all extremities,  sensation intact to light touch, sensation intact to pinprick, and gait normal.     Impression & Recommendations:  Problem # 1:  DM (ICD-250.00) Patient now agree to start insulin. Will start lantus 15 units. Will need to adjust dose next visit. Patient was advised to check blood sugar fasting in the morning. I will decrease glipizide to once a day to avoid hypoglycemia. I will check Bmet. I will refer her to opthalmology for diabetes eye exam and history of blurry vision. I think blurry vision is due to uncontrolled diabetes. Her neuro exam was non focal.  Her updated medication list for this problem includes:    Metformin Hcl 1000 Mg Tabs (Metformin hcl) .Marland Kitchen... Take 1 tablet by mouth two times a day    Glucotrol 10 Mg Tabs (Glipizide) .Marland Kitchen... Take one tablet once a  day.    Cozaar 50 Mg Tabs (Losartan potassium) .Marland Kitchen... Take 1 pill by mouth daily.    Lantus Solostar 100 Unit/ml Soln (Insulin glargine) ..... Inject 15 units under the skin at bedtime.  Orders: T-Basic Metabolic Panel [id number]) Ophthalmology Referral (Ophthalmology)  Problem # 2:  HYPERTENSION (ICD-401.9) Her blood pressure is well controlled. I will continue with current regimen. Bmet done today.  Her updated medication list for this problem includes:    Cozaar 50 Mg Tabs (Losartan potassium) .Marland Kitchen... Take 1 pill by mouth daily.    Hydrochlorothiazide 25 Mg Tabs (Hydrochlorothiazide) .Marland Kitchen... Take one tablet daily for blood pressure.  BP today: 129/88 Prior BP: 140/81 (09/30/2009)  Labs Reviewed: K+: 3.7 (09/30/2009) Creat: : 0.60 (09/30/2009)   Chol: 254 (09/30/2009)   HDL: 55 (09/30/2009)   LDL: 179 (09/30/2009)   TG: 101 (09/30/2009)  Problem # 3:  HEADACHE (ICD-784.0) She is complaining of headache.. I think this is tension headache. She is in a lot of stress because her son is in jail. Her neuro exam was non focal. She didnt have headache today. I will continue with tylenol and tramadol for headache as needed. Patient  was advised to call 911 if she developed weakness, numbness, or speech problems.  Her updated medication list for this problem includes:    Ultram 50 Mg Tabs (Tramadol hcl) .Marland Kitchen... Take 1 tablet by mouth every 8 hours as needed for pain  Complete Medication List: 1)  One Daily Womens Tabs (Multiple vitamins-minerals) 2)  Metformin Hcl 1000 Mg Tabs (Metformin hcl) .... Take 1 tablet by mouth two times a day 3)  Glucotrol 10 Mg Tabs (Glipizide) .... Take one tablet once a  day. 4)  Pravachol 80 Mg Tabs (Pravastatin sodium) .... Take 1 pill by mouth daily. 5)  Ultram 50 Mg Tabs (Tramadol hcl) .... Take 1 tablet by mouth every 8 hours as needed for pain 6)  Cozaar 50 Mg Tabs (Losartan potassium) .Marland KitchenMarland KitchenMarland Kitchen  Take 1 pill by mouth daily. 7)  Hydrochlorothiazide 25 Mg Tabs (Hydrochlorothiazide) .... Take one tablet daily for blood pressure. 8)  Vitamin D (ergocalciferol) 50000 Unit Caps (Ergocalciferol) .... Take one tablet weekly for 8 weeks. 9)  Neurontin 300 Mg Caps (Gabapentin) .... Take 1 pill by mouth at bedtime 10)  Cetirizine Hcl 10 Mg Tabs (Cetirizine hcl) .... Take 1 pill by mouth daily as needed for allergy 11)  Lantus Solostar 100 Unit/ml Soln (Insulin glargine) .... Inject 15 units under the skin at bedtime. 12)  Unifine Pentips 31g X 8 Mm Misc (Insulin pen needle) .... Use as directed.  Other Orders: Capillary Blood Glucose/CBG ([id number])  Patient Instructions: 1)  Please schedule a follow-up appointment in 2 weeks. 2)  Please schedule appointment with Scherrie Gerlach for insulin injection teaching.  3)  Check blood sugar fasting in the morning.  4)  Take glipizide once a day after you start using insulin.  5)  Take 650-1000mg  of Tylenol every 4-6 hours as needed for relief of pain or comfort of fever AVOID taking more than 4000mg   in a 24 hour period (can cause liver damage in higher doses). Prescriptions: UNIFINE PENTIPS 31G X 8 MM MISC (INSULIN PEN NEEDLE) use as directed.  #1box x 5   Entered and  Authorized by:   Hartley Barefoot MD   Signed by:   Hartley Barefoot MD on 10/19/2009   Method used:   Print then Give to Patient   RxID:   [id number] LANTUS SOLOSTAR 100 UNIT/ML SOLN (INSULIN GLARGINE) Inject 15 units under the skin at bedtime.  #10 x 5   Entered and Authorized by:   Hartley Barefoot MD   Signed by:   Hartley Barefoot MD on 10/19/2009   Method used:   Print then Give to Patient   RxID:   [id number]   Prevention & Chronic Care Immunizations   Influenza vaccine: Not documented   Influenza vaccine deferral: Deferred  (08/31/2009)   Influenza vaccine due: 02/11/2010    Tetanus booster: Not documented   Td booster deferral: Deferred  (08/31/2009)    Pneumococcal vaccine: Not documented   Pneumococcal vaccine deferral: Deferred  (08/31/2009)  Other Screening   Pap smear: Not documented    Mammogram: Not documented   Mammogram due: 06/13/2009   Smoking status: quit  (10/19/2009)  Diabetes Mellitus   HgbA1C: 9.2  (09/30/2009)    Eye exam: Not documented   Diabetic eye exam action/deferral: Ophthalmology referral  (08/31/2009)    Foot exam: yes  (06/11/2009)   Foot exam action/deferral: Do today   High risk foot: Not documented   Foot care education: Not documented    Urine microalbumin/creatinine ratio: 6.6  (09/30/2009)  Lipids   Total Cholesterol: 254  (09/30/2009)   LDL: 179  (09/30/2009)   LDL Direct: Not documented   HDL: 55  (09/30/2009)   Triglycerides: 101  (09/30/2009)    SGOT (AST): 20  (09/30/2009)   SGPT (ALT): 47  (09/30/2009)   Alkaline phosphatase: 73  (09/30/2009)   Total bilirubin: 0.4  (09/30/2009)  Hypertension   Last Blood Pressure: 129 / 88  (10/19/2009)   Serum creatinine: 0.60  (09/30/2009)   Serum potassium 3.7  (09/30/2009)  Self-Management Support :   Personal Goals (by the next clinic visit) :     Personal A1C goal: 7  (04/03/2009)     Personal blood pressure goal: 140/90  (04/03/2009)     Personal LDL  goal: 70  (04/03/2009)  Patient will work on the following items until the next clinic visit to reach self-care goals:     Medications and monitoring: take my medicines every day, check my blood sugar, check my blood pressure, bring all of my medications to every visit, weigh myself weekly, examine my feet every day  (10/19/2009)     Eating: drink diet soda or water instead of juice or soda, eat more vegetables, use fresh or frozen vegetables, eat foods that are low in salt, eat baked foods instead of fried foods, eat fruit for snacks and desserts, limit or avoid alcohol  (10/19/2009)     Activity: park at the far end of the parking lot  (10/19/2009)    Diabetes self-management support: Written self-care plan, Education handout, Resources for patients handout  (10/19/2009)   Diabetes care plan printed   Diabetes education handout printed   Last diabetes self-management training by diabetes educator: 08/18/2009    Hypertension self-management support: Written self-care plan, Education handout, Resources for patients handout  (10/19/2009)   Hypertension self-care plan printed.   Hypertension education handout printed    Lipid self-management support: Written self-care plan, Education handout, Resources for patients handout  (10/19/2009)   Lipid self-care plan printed.   Lipid education handout printed      Resource handout printed.  Process Orders Check Orders Results:     [facility]: ABN not required for this insurance Tests Sent for requisitioning (Oct 20, 2009 8:22 AM):     10/19/2009: [facility] -- T-Basic Metabolic Panel [id number] (signed)    Process Orders Check Orders Results:     [facility]: ABN not required for this insurance Tests Sent for requisitioning (Oct 20, 2009 8:22 AM):     10/19/2009: [facility] -- T-Basic Metabolic Panel [phone] (signed)

## 2010-07-15 NOTE — Progress Notes (Signed)
Summary: Handicap Sticker  Phone Note Call from Patient   Caller: Patient Call For: Whitney Post MD Summary of Call: Pt was given a Handicap sticker form that had beencompleted by Dr. Odis Luster.  Pt was given a 6 month-green form.  Pt would like to get the [facility] permanent sticker form filled out for pick up if possible.   Pt has complaints of knee pain.  Did not purchase the temporary sticker. [name] RN  June 01, 2010 10:50 AM  Initial call taken by: [name] RN,  June 01, 2010 10:50 AM  Follow-up for Phone Call        Please have patient's PCP address this. Follow-up by: Margarito Liner MD,  June 01, 2010 11:00 AM  Additional Follow-up for Phone Call Additional follow up Details #1::        message is communicated to pt Additional Follow-up by: Marin Roberts RN,  June 03, 2010 12:09 PM    Additional Follow-up for Phone Call Additional follow up Details #2::    Thank you for communicating with the patient. I am happy to fill out the form for the white sticker.   Additional Follow-up for Phone Call Additional follow up Details #3:: Details for Additional Follow-up Action Taken: I called Ms. Sayavong and told her that she can pick up the handicap sticker at her convenience at the front desk.  Additional Follow-up by: Whitney Post MD,  June 14, 2010 9:05 AM

## 2010-07-15 NOTE — Assessment & Plan Note (Signed)
Summary: ED f/u gg   Vital Signs:  Patient profile:   50 year old female Height:      64.5 inches (163.83 cm) Weight:      241.0 pounds (109.55 kg) BMI:     40.88 Temp:     97.3 degrees F (36.28 degrees C) oral Pulse rate:   93 / minute BP sitting:   111 / 75  (left arm) Cuff size:   regular  Vitals Entered By: Theotis Barrio NT II (February 12, 2010 11:12 AM) CC: PATIENT IS HERE FOR ED FOLLOW UP APPT/  BACK AND LEG PAIN  / APPT WITH  [facility]. CAN THIS APPT BE MOVED UP PER REQUEST OF ED DOC. Is Patient Diabetic? Yes Did you bring your meter with you today? Yes Pain Assessment Patient in pain? yes     Location: BACK/ R-LEG Intensity: 8 Type: SHARP/ THROB Onset of pain   FOR  ABOUT 6  WEEKS Nutritional Status BMI of > 30 = obese  Have you ever been in a relationship where you felt threatened, hurt or afraid?No   Does patient need assistance? Functional Status Self care Ambulation Normal Comments FLU SHOT    Primary Care Provider:  Whitney Post MD  CC:  PATIENT IS HERE FOR ED FOLLOW UP APPT/  BACK AND LEG PAIN  / APPT WITH  [facility]. CAN THIS APPT BE MOVED UP PER REQUEST OF ED DOC.Marland Kitchen  History of Present Illness: follow up from ED for: 1. SOB/CP and leg pain. no ED rerort was found in E-chart. CXR and labs done in ED reviewed (CE, CBC and I-stat) were negative. 2. Patient continues to c/o LBP and right LE pain. Sees Dr. Criss Alvine at [facility]. MRI of L-spine was done that demonstrated herniated discs. Patient has a follow up appointment in October of 2011. Patient Takes percocet 10/325 (dose increased by ED MD), Tyelnol and Neurontin --relieves pain only temporarily.  Preventive Screening-Counseling & Management  Alcohol-Tobacco     Smoking Status: quit     Year Quit: 2000  Caffeine-Diet-Exercise     Does Patient Exercise: no  Allergies (verified): No Known Drug Allergies  Past History:  Past medical, surgical, family and social  histories (including risk factors) reviewed for relevance to current acute and chronic problems.  Past Medical History: Reviewed history from 01/20/2010 and no changes required. chronic back pain 2/2 compression right S1 nerve root hyperlipidemia diabetes HTN Abdominal pain-hospitalized 11/10-neg CT A/P, cardiac enz, FOBT, h. pylori ab, UA, lipase, HIDA. Mild transaminitis-CT c/w hepatic steatosis  Past Surgical History: Reviewed history from 04/15/2009 and no changes required. hysterectomy  Family History: Reviewed history from 12/09/2008 and no changes required. Father died at 37 from MI.  Family History Diabetes 1st degree relative  Social History: Reviewed history from 01/20/2010 and no changes required. Unemployed. Lives with husband and daughter. Uninsured.  Physical Exam  General:  obese woman, teary eyed through much of the exam, particularly during the MSK exam Head:  no abnormalities observed.   Eyes:  vision grossly intact, pupils equal, pupils round, and pupils reactive to light.   Ears:  R ear normal and L ear normal.   Nose:  no external erythema and no nasal discharge.   Mouth:  pharynx pink and moist.   Neck:  no masses and no neck tenderness.   Lungs:  normal respiratory effort, no accessory muscle use, normal breath sounds, no crackles, and no wheezes.   Heart:  normal  rate, regular rhythm, and no murmur.   Abdomen:  soft, non-tender, and normal bowel sounds.   Msk:  no joint tenderness, no joint swelling, no joint warmth, and no redness over joints.   Pulses:  2+ Extremities:  trace left pedal edema and trace right pedal edema.    Diabetes Management Exam:    Foot Exam (with socks and/or shoes not present):       Sensory-Pinprick/Light touch:          Left medial foot (L-4): normal          Left dorsal foot (L-5): normal          Left lateral foot (S-1): normal          Right medial foot (L-4): normal          Right dorsal foot (L-5): normal           Right lateral foot (S-1): normal       Sensory-Monofilament:          Left foot: normal          Right foot: normal       Inspection:          Left foot: normal          Right foot: normal       Nails:          Left foot: normal          Right foot: normal    Foot Exam by Podiatrist:       Date: 02/12/2010       Results: no diabetic findings       Done by: Denton Meek   Impression & Recommendations:  Problem # 1:  HYPERTENSION (ICD-401.9) Assessment Improved Controlled. Her updated medication list for this problem includes:    Avapro 150 Mg Tabs (Irbesartan) .Marland Kitchen... Take 1 tablet by mouth once a day    Hydrochlorothiazide 25 Mg Tabs (Hydrochlorothiazide) .Marland Kitchen... Take one tablet daily for blood pressure.  BP today: 111/75 Prior BP: 141/94 (01/20/2010)  Labs Reviewed: K+: 3.7 (11/23/2009) Creat: : 0.65 (11/23/2009)   Chol: 184 (11/23/2009)   HDL: 35 (11/23/2009)   LDL: 122 (11/23/2009)   TG: 133 (11/23/2009)  Problem # 2:  DM (ICD-250.00) Uncontrolled. DM. etiology, Tx and health risks reviewed with the patient. Strongly advised to adhere with a Tx regimen. Her updated medication list for this problem includes:    Metformin Hcl 1000 Mg Tabs (Metformin hcl) .Marland Kitchen... Take 1 tablet by mouth two times a day    Glucotrol 10 Mg Tabs (Glipizide) .Marland Kitchen... Take one tablet once a  day.    Avapro 150 Mg Tabs (Irbesartan) .Marland Kitchen... Take 1 tablet by mouth once a day    Lantus Solostar 100 Unit/ml Soln (Insulin glargine) ..... Inject 35 units under the skin at bedtime.  Labs Reviewed: Creat: 0.65 (11/23/2009)    Reviewed HgBA1c results: 8.9 (12/17/2009)  9.2 (09/30/2009)  Problem # 3:  HERNIATED LUMBAR DISC (ICD-722.10) Pain contract obtained. Risk of addiction and sedation with opioid use discussed with the patient. 15 mg of Toradol Im x 1 given. Percocet refilled; referred [facility] a physical therapy through a [facility]; Ibuprofen 400 mg Po q 6 hrs PRn with meals. Avoid OTC Tylenol given use of  percocet. Follow up with a spine specialist as instructed in October, 2011. Orders: Physical Therapy Referral (PT)  Complete Medication List: 1)  Metformin Hcl 1000 Mg Tabs (Metformin hcl) .... Take 1 tablet by  mouth two times a day 2)  Glucotrol 10 Mg Tabs (Glipizide) .... Take one tablet once a  day. 3)  Avapro 150 Mg Tabs (Irbesartan) .... Take 1 tablet by mouth once a day 4)  Hydrochlorothiazide 25 Mg Tabs (Hydrochlorothiazide) .... Take one tablet daily for blood pressure. 5)  Vitamin D (ergocalciferol) 50000 Unit Caps (Ergocalciferol) .... Take one tablet weekly for 8 weeks. 6)  Lantus Solostar 100 Unit/ml Soln (Insulin glargine) .... Inject 35 units under the skin at bedtime. 7)  Unifine Pentips 31g X 8 Mm Misc (Insulin pen needle) .... Use as directed. 8)  Gabapentin 800 Mg Tabs (Gabapentin) .... Take 1 tablet by mouth three times a day 9)  Oxycodone-acetaminophen 10-325 Mg Tabs (Oxycodone-acetaminophen) .... Take one tablet by mouth q 4 hours as needed for pain 10)  Celebrex 200 Mg Caps (Celecoxib) .... Take one tablet twice per day for acute pain 11)  Simvastatin 40 Mg Tabs (Simvastatin) .Marland Kitchen.. 1 tablet by mouth daily 12)  Nexium 40 Mg Cpdr (Esomeprazole magnesium) .... Take one tablet by mouth once a day  Other Orders: T-Drug Screen-Urine, (single) [phone]) Influenza Vaccine NON MCR (09811) Admin of Therapeutic Inj  intramuscular or subcutaneous (91478) Ketorolac-Toradol 15mg  (G9562)  Patient Instructions: 1)  Please, take your medications as prescribed. 2)  Please, follow up with a physical therapy. 3)  Avoid using alcohol or illicit drugs. 4)  Call with any questions. 5)  Follow up with a spine specialist as instructed. 6)  Follow up on as needed basis. Prescriptions: OXYCODONE-ACETAMINOPHEN 10-325 MG TABS (OXYCODONE-ACETAMINOPHEN) Take one tablet by mouth q 4 hours as needed for pain  #120 x 0   Entered and Authorized by:   Deatra Robinson MD   Signed by:   Deatra Robinson MD on 02/12/2010   Method used:   Print then Give to Patient   RxID:   [id number]   Handout requested. GLUCOTROL 10 MG TABS (GLIPIZIDE) Take one tablet once a  day.  #30 x 11   Entered and Authorized by:   Deatra Robinson MD   Signed by:   Deatra Robinson MD on 02/12/2010   Method used:   Print then Give to Patient   RxID:   [id number] AVAPRO 150 MG TABS (IRBESARTAN) Take 1 tablet by mouth once a day  #90 x 4   Entered and Authorized by:   Deatra Robinson MD   Signed by:   Deatra Robinson MD on 02/12/2010   Method used:   Print then Give to Patient   RxID:   [id number] HYDROCHLOROTHIAZIDE 25 MG TABS (HYDROCHLOROTHIAZIDE) Take one tablet daily for blood pressure.  #30 x 11   Entered and Authorized by:   Deatra Robinson MD   Signed by:   Deatra Robinson MD on 02/12/2010   Method used:   Print then Give to Patient   RxID:   [id number] LANTUS SOLOSTAR 100 UNIT/ML SOLN (INSULIN GLARGINE) Inject 35 units under the skin at bedtime.  #1 mo supply x 11   Entered and Authorized by:   Deatra Robinson MD   Signed by:   Deatra Robinson MD on 02/12/2010   Method used:   Print then Give to Patient   RxID:   [id number] SIMVASTATIN 40 MG TABS (SIMVASTATIN) 1 tablet by mouth daily  #30 x 11   Entered and Authorized by:   Deatra Robinson MD   Signed by:   Deatra Robinson MD on 02/12/2010   Method used:   Print then Give to  Patient   RxID:   [id number] CELEBREX 200 MG CAPS (CELECOXIB) Take one tablet twice per day for acute pain  #60 x 11   Entered and Authorized by:   Deatra Robinson MD   Signed by:   Deatra Robinson MD on 02/12/2010   Method used:   Print then Give to Patient   RxID:   [id number] PERCOCET 5-325 MG TABS (OXYCODONE-ACETAMINOPHEN) Take 1 tablet every 4 hours as needed for pain  #120 x 0   Entered and Authorized by:   Deatra Robinson MD   Signed by:   Deatra Robinson MD on 02/12/2010   Method used:   Print then Give to Patient    RxID:   [id number] GABAPENTIN 800 MG TABS (GABAPENTIN) Take 1 tablet by mouth three times a day  #30 x 11   Entered and Authorized by:   Deatra Robinson MD   Signed by:   Deatra Robinson MD on 02/12/2010   Method used:   Print then Give to Patient   RxID:   [id number]  Process Orders Check Orders Results:     [facility]: ABN not required for this insurance Tests Sent for requisitioning (February 12, 2010 4:26 PM):     02/12/2010: [facility] -- T-Drug Screen-Urine, (single) [[id number]] (signed)     Prevention & Chronic Care Immunizations   Influenza vaccine: Fluvax Non-MCR  (02/12/2010)   Influenza vaccine deferral: Deferred  (08/31/2009)   Influenza vaccine due: 02/11/2010    Tetanus booster: Not documented   Td booster deferral: Deferred  (08/31/2009)    Pneumococcal vaccine: Not documented   Pneumococcal vaccine deferral: Deferred  (08/31/2009)  Other Screening   Pap smear: Not documented    Mammogram: Not documented   Mammogram due: 06/13/2009   Smoking status: quit  (02/12/2010)  Diabetes Mellitus   HgbA1C: 8.9  (12/17/2009)    Eye exam: Not documented   Diabetic eye exam action/deferral: Ophthalmology referral  (08/31/2009)    Foot exam: yes  (02/12/2010)   Foot exam action/deferral: Do today   High risk foot: Not documented   Foot care education: Not documented    Urine microalbumin/creatinine ratio: 6.6  (09/30/2009)  Lipids   Total Cholesterol: 184  (11/23/2009)   LDL: 122  (11/23/2009)   LDL Direct: Not documented   HDL: 35  (11/23/2009)   Triglycerides: 133  (11/23/2009)    SGOT (AST): 35  (11/23/2009)   SGPT (ALT): 38  (11/23/2009)   Alkaline phosphatase: 64  (11/23/2009)   Total bilirubin: 0.3  (11/23/2009)  Hypertension   Last Blood Pressure: 111 / 75  (02/12/2010)   Serum creatinine: 0.65  (11/23/2009)   Serum potassium 3.7  (11/23/2009)  Self-Management Support :   Personal Goals (by  the next clinic visit) :     Personal A1C goal: 7  (04/03/2009)     Personal blood pressure goal: 140/90  (04/03/2009)     Personal LDL goal: 70  (04/03/2009)    Patient will work on the following items until the next clinic visit to reach self-care goals:     Medications and monitoring: take my medicines every day, check my blood sugar, check my blood pressure, bring all of my medications to every visit, weigh myself weekly, examine my feet every day  (02/12/2010)     Eating: drink diet soda or water instead of juice or soda, eat more vegetables, eat foods that are low in salt, eat fruit for snacks and desserts, limit or avoid  alcohol  (02/12/2010)     Activity: take a 30 minute walk every day  (02/12/2010)    Diabetes self-management support: Resources for patients handout  (02/12/2010)   Last diabetes self-management training by diabetes educator: 10/19/2009    Hypertension self-management support: Resources for patients handout  (02/12/2010)    Lipid self-management support: Resources for patients handout  (02/12/2010)        Resource handout printed.    Immunizations Administered:  Influenza Vaccine # 1:    Vaccine Type: Fluvax Non-MCR    Site: right deltoid    Mfr: GlaxoSmithKline    Dose: 0.5 ml    Route: IM    Given by: [name] RN    Exp. Date: 12/11/2010    Lot #: [id number]    VIS given: 01/05/10 version given February 12, 2010.  Flu Vaccine Consent Questions:    Do you have a history of severe allergic reactions to this vaccine? no    Any prior history of allergic reactions to egg and/or gelatin? no    Do you have a sensitivity to the preservative Thimersol? no    Do you have a past history of Guillan-Barre Syndrome? no    Do you currently have an acute febrile illness? no    Have you ever had a severe reaction to latex? no    Vaccine information given and explained to patient? yes    Are you currently pregnant? no    Medication  Administration  Injection # 1:    Medication: Ketorolac-Toradol 15mg     Diagnosis: LEG PAIN (ICD-729.5)    Route: IM    Site: L deltoid    Exp Date: 09/2011    Lot #: [id number]    Mfr: [place]hard    Patient tolerated injection without complications    Given by: [name] RN (February 12, 2010 12:21 PM)  Orders Added: 1)  Physical Therapy Referral [PT] 2)  Est. Patient Level III [99213] 3)  T-Drug Screen-Urine, (single) [80101-82900] 4)  Influenza Vaccine NON MCR [00028] 5)  Admin of Therapeutic Inj  intramuscular or subcutaneous [96372] 6)  Ketorolac-Toradol 15mg  [J1885]      Appended Document: ED f/u gg Oxycodone APAP 10-325 was $80. If changed to 5-325,she can get for less than $10 at the [facility] outpt pharmacy. Original Rx destroyed. I did not increase # of pills given   Clinical Lists Changes  Medications: Changed medication from OXYCODONE-ACETAMINOPHEN 10-325 MG TABS (OXYCODONE-ACETAMINOPHEN) Take one tablet by mouth q 4 hours as needed for pain to OXYCODONE-ACETAMINOPHEN 5-325 MG TABS (OXYCODONE-ACETAMINOPHEN) Take one pill by mouth every 4 hours as needed for pain. - Signed Rx of OXYCODONE-ACETAMINOPHEN 5-325 MG TABS (OXYCODONE-ACETAMINOPHEN) Take one pill by mouth every 4 hours as needed for pain.;  #120 x 0;  Signed;  Entered by: Blanch Media MD;  Authorized by: Blanch Media MD;  Method used: Print then Give to Patient Rx of OXYCODONE-ACETAMINOPHEN 5-325 MG TABS (OXYCODONE-ACETAMINOPHEN) Take one pill by mouth every 4 hours as needed for pain.;  #120 x 0;  Signed;  Entered by: Blanch Media MD;  Authorized by: Blanch Media MD;  Method used: Reprint    Prescriptions: OXYCODONE-ACETAMINOPHEN 5-325 MG TABS (OXYCODONE-ACETAMINOPHEN) Take one pill by mouth every 4 hours as needed for pain.  #120 x 0   Entered and Authorized by:   Blanch Media MD   Signed by:   Blanch Media MD on 02/16/2010   Method used:   Reprint   RxID:  [id number] OXYCODONE-ACETAMINOPHEN 5-325 MG TABS (OXYCODONE-ACETAMINOPHEN) Take one pill by mouth every 4 hours as needed for pain.  #120 x 0   Entered and Authorized by:   Blanch Media MD   Signed by:   Blanch Media MD on 02/16/2010   Method used:   Print then Give to Patient   RxID:   [id number]   Pt given Rx.  Old Rx for oxycodone-Apap 10-325 was returned to [place] and discarded. Merrie Roof RN  February 16, 2010 10:59 AM

## 2010-07-15 NOTE — Miscellaneous (Signed)
Summary: MEDICATION CONTRACT  MEDICATION CONTRACT   Imported By: Louretta Parma 02/18/2010 11:44:45  _____________________________________________________________________  External Attachment:    Type:   Image     Comment:   External Document

## 2010-07-15 NOTE — Progress Notes (Signed)
Summary: cozaar too expensive/ hla  Phone Note Call from Patient   Summary of Call: pt calls to say her copay for cozaar is $54.00, she cannot afford this please change. Initial call taken by: Marin Roberts RN,  August 07, 2009 10:17 AM  Follow-up for Phone Call        If she goes to [facility] it should be under 4 dollars. Can you first check with [facility] before notifying her, and if it is not generic please ask them what generic ARB do they have? Thank you.  Follow-up by: Jason Coop MD,  August 08, 2009 11:38 PM

## 2010-07-15 NOTE — Progress Notes (Signed)
Summary: neuro/ hla  Phone Note Call from Patient   Summary of Call: pt is calling to see where the process is at in making an appt w/ a neuro, i do not see a referral for neuro...i informed her that it is in the process of being done, that it may take several weeks and someone would call her to let her know when it was complete and to please call for appt or questions as needed, she is agreeable Initial call taken by: Marin Roberts RN,  January 08, 2010 11:00 AM     Patient has an appointment with a neurosurgeon at [facility] in October. She will follow-up in our clinic next month to work on achieving better diabetic control prior to this appointment.

## 2010-07-15 NOTE — Progress Notes (Signed)
Summary: medications  Phone Note Refill Request Message from:  Patient on January 06, 2010 3:52 PM  Refills Requested: Medication #1:  SIMVASTATIN 40 MG TABS take 1 pill by mouth daily.  Medication #2:  HYDROCHLOROTHIAZIDE 25 MG TABS Take one tablet daily for blood pressure.  Medication #3:  GLUCOTROL 10 MG TABS Take one tablet once a  day. Prescriptions for call to the [facility] pharmacy.   Method Requested: Fax to Local Pharmacy Initial call taken by: [name] RN,  January 06, 2010 3:52 PM

## 2010-07-15 NOTE — Progress Notes (Signed)
Summary: chest pain/ hla  Phone Note Call from Patient   Summary of Call: pt calls c/o chest pain, R sided weakness since 4/4, pt is advised to go to ED asap, states chest pain comes and goes but hard to walk constant, nothing alleviates, nothing increases. pt states she will go to ED now. Initial call taken by: Marin Roberts RN,  December 16, 2009 12:02 PM  Follow-up for Phone Call        Agree. Follow-up by: Zoila Shutter MD,  December 16, 2009 4:47 PM

## 2010-07-15 NOTE — Letter (Signed)
Summary: HANDICAPPED   HANDICAPPED   Imported By: Margie Billet 06/15/2010 10:25:21  _____________________________________________________________________  External Attachment:    Type:   Image     Comment:   External Document

## 2010-07-15 NOTE — Assessment & Plan Note (Signed)
Summary: F/U/EST/VS   Vital Signs:  Patient profile:   50 year old female Height:      64.5 inches (163.83 cm) Weight:      240.05 pounds (109.11 kg) BMI:     40.71 Temp:     97.4 degrees F (36.33 degrees C) oral Pulse rate:   68 / minute BP sitting:   140 / 81  (right arm) Cuff size:   large  Vitals Entered By: [name] RN (September 30, 2009 2:23 PM) Is Patient Diabetic? Yes Did you bring your meter with you today? Yes Pain Assessment Patient in pain? no      Nutritional Status BMI of > 30 = obese CBG Result 138  Have you ever been in a relationship where you felt threatened, hurt or afraid?No   Does patient need assistance? Functional Status Self care Ambulation Normal Comments Allergies are messing with nose, eyes and breathing.  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  Goes to Dr. Farris Has.  [facility] has been doing the injections.   Primary Care Provider:  Jason Coop MD   History of Present Illness: [name] is a 50 yo lady with PMH as outlined in the EMR comes today for a f/u visit.   1. HTN: She is tolerating all her HTN meds.   2. HL: She is taking her pravastatin without any problem.   3. Cough: The cough is better now and she was taking omeprazole.   4. DM: She says her CBGs are usually higher but it was in 130's today in [facility].   5. Obesity: She walks for 15 minutes for twice a week. Her knees are getting better.   Depression History:      The patient denies a depressed mood most of the day and a diminished interest in her usual daily activities.         Preventive Screening-Counseling & Management  Alcohol-Tobacco     Smoking Status: quit     Year Quit: 2000  Current Medications (verified): 1)  One Daily Womens  Tabs (Multiple Vitamins-Minerals) 2)  Metformin Hcl 1000 Mg Tabs (Metformin Hcl) .... Take 1 Tablet By Mouth Two Times A Day 3)  Omeprazole 40 Mg Cpdr (Omeprazole) .... Take 1 Pill By Mouth Two Times A Day. 4)  Glucotrol  10 Mg Tabs (Glipizide) .... Take One Tablet Two Times A Day. 5)  Pravachol 40 Mg Tabs (Pravastatin Sodium) .... Take 1 Pill By Mouth Daily. 6)  Ultram 50 Mg Tabs (Tramadol Hcl) .... Take 1 Tablet By Mouth Every 8 Hours As Needed For Pain 7)  Cozaar 50 Mg Tabs (Losartan Potassium) .... Take 1 Pill By Mouth Daily. 8)  Hydrochlorothiazide 25 Mg Tabs (Hydrochlorothiazide) .... Take One Tablet Daily For Blood Pressure. 9)  Vitamin D (Ergocalciferol) 50000 Unit Caps (Ergocalciferol) .... Take One Tablet Weekly For 8 Weeks. 10)  Neurontin 300 Mg Caps (Gabapentin) .... Take 1 Pill By Mouth At Bedtime 11)  Cetirizine Hcl 10 Mg Tabs (Cetirizine Hcl) .... Take 1 Pill By Mouth Daily As Needed For Allergy  Allergies: No Known Drug Allergies  Review of Systems      See HPI  Physical Exam  General:  alert.  alert.   Mouth:  pharynx pink and moist.  pharynx pink and moist.   Lungs:  no dullness, no crackles, and no wheezes.  no dullness, no crackles, and no wheezes.   Heart:  normal rate, regular rhythm, and no murmur.  normal rate, regular rhythm,  and no murmur.     Impression & Recommendations:  Problem # 1:  COUGH (ICD-786.2) This is most probably from ACEi vs GERD. Plan for now is to continue to d/c ACEi and take PPI.   Problem # 2:  HYPERTENSION (ICD-401.9)  BP little higher than her goal, will follow with current meds.  Her updated medication list for this problem includes:    Cozaar 50 Mg Tabs (Losartan potassium) .Marland Kitchen... Take 1 pill by mouth daily.    Hydrochlorothiazide 25 Mg Tabs (Hydrochlorothiazide) .Marland Kitchen... Take one tablet daily for blood pressure.    BP today: 140/81 Prior BP: 136/88 (08/31/2009)  Labs Reviewed: K+: 3.8 (07/28/2009) Creat: : 0.71 (07/28/2009)   Chol: 192 (07/28/2009)   HDL: 34 (07/28/2009)   LDL: 133 (07/28/2009)   TG: 126 (07/28/2009)  Her updated medication list for this problem includes:    Cozaar 50 Mg Tabs (Losartan potassium) .Marland Kitchen... Take 1 pill by mouth  daily.    Hydrochlorothiazide 25 Mg Tabs (Hydrochlorothiazide) .Marland Kitchen... Take one tablet daily for blood pressure.  Orders: T-Comprehensive Metabolic Panel [phone]) T-Lipid Profile [phone]) T-TSH [id number]) T-Urine Microalbumin w/creat. ratio [id number])  Problem # 3:  HYPERLIPIDEMIA (ICD-272.4)  LDL is increased from 133 to 179. I left a message to Ms. Curiale to confirm if she is taking any statin and if so its dose. She will notify the clinic and will adjust the dose of statin accordingly.   Her updated medication list for this problem includes:    Pravachol 40 Mg Tabs (Pravastatin sodium) .Marland Kitchen... Take 1 pill by mouth daily.    Labs Reviewed: SGOT: 37 (07/28/2009)   SGPT: 51 (07/28/2009)   HDL:34 (07/28/2009), 33 (06/12/2009)  LDL:133 (07/28/2009), 131 (06/12/2009)  Chol:192 (07/28/2009), 187 (06/12/2009)  Trig:126 (07/28/2009), 114 (06/12/2009)  Her updated medication list for this problem includes:    Pravachol 40 Mg Tabs (Pravastatin sodium) .Marland Kitchen... Take 1 pill by mouth daily.  Orders: T-Comprehensive Metabolic Panel [phone]) T-Lipid Profile [phone]) T-TSH [phone]) T-Urine Microalbumin w/creat. ratio [id number])  Problem # 4:  DM (ICD-250.00)  Again, [name] is not ready to start insulin yet. She has been little more active but not enough. She also is not eating right. She wants to work on this and wants 2 months time and if there is no progress, plan is to start insulin.  Her updated medication list for this problem includes:    Metformin Hcl 1000 Mg Tabs (Metformin hcl) .Marland Kitchen... Take 1 tablet by mouth two times a day    Glucotrol 10 Mg Tabs (Glipizide) .Marland Kitchen... Take one tablet two times a day.    Cozaar 50 Mg Tabs (Losartan potassium) .Marland Kitchen... Take 1 pill by mouth daily.  Orders: T- Capillary Blood Glucose ([id number]) T-Hgb A1C (in-house) ([id number]) T-Comprehensive Metabolic Panel [phone]) T-Lipid Profile ([id number]) T-TSH  ([id number]) T-Urine Microalbumin w/creat. ratio [phone])  Her updated medication list for this problem includes:    Metformin Hcl 1000 Mg Tabs (Metformin hcl) .Marland Kitchen... Take 1 tablet by mouth two times a day    Glucotrol 10 Mg Tabs (Glipizide) .Marland Kitchen... Take one tablet two times a day.    Cozaar 50 Mg Tabs (Losartan potassium) .Marland Kitchen... Take 1 pill by mouth daily.  Problem # 5:  OBESITY, MORBID (ICD-278.01)  Lost wt 3 lbs since last visit. See DM   Ht: 64.5 (09/30/2009)   Wt: 240.05 (09/30/2009)   BMI: 40.71 (09/30/2009)  Orders: T-Comprehensive Metabolic Panel [phone]) T-Lipid Profile ([id number]) T-TSH ([id number]) T-Urine Microalbumin w/creat. ratio [phone])  Complete Medication  List: 1)  One Daily Womens Tabs (Multiple vitamins-minerals) 2)  Metformin Hcl 1000 Mg Tabs (Metformin hcl) .... Take 1 tablet by mouth two times a day 3)  Omeprazole 40 Mg Cpdr (Omeprazole) .... Take 1 pill by mouth two times a day. 4)  Glucotrol 10 Mg Tabs (Glipizide) .... Take one tablet two times a day. 5)  Pravachol 40 Mg Tabs (Pravastatin sodium) .... Take 1 pill by mouth daily. 6)  Ultram 50 Mg Tabs (Tramadol hcl) .... Take 1 tablet by mouth every 8 hours as needed for pain 7)  Cozaar 50 Mg Tabs (Losartan potassium) .... Take 1 pill by mouth daily. 8)  Hydrochlorothiazide 25 Mg Tabs (Hydrochlorothiazide) .... Take one tablet daily for blood pressure. 9)  Vitamin D (ergocalciferol) 50000 Unit Caps (Ergocalciferol) .... Take one tablet weekly for 8 weeks. 10)  Neurontin 300 Mg Caps (Gabapentin) .... Take 1 pill by mouth at bedtime 11)  Cetirizine Hcl 10 Mg Tabs (Cetirizine hcl) .... Take 1 pill by mouth daily as needed for allergy  Patient Instructions: 1)  Please schedule a follow-up appointment in 2 months. 2)  Limit your Sodium (Salt) to less than 2 grams a day(slightly less than 1/2 a teaspoon) to prevent fluid retention, swelling, or worsening of symptoms. 3)  It is important  that you exercise regularly at least 20 minutes 5 times a week. If you develop chest pain, have severe difficulty breathing, or feel very tired , stop exercising immediately and seek medical attention. 4)  You need to lose weight. Consider a lower calorie diet and regular exercise.  5)  Check your blood sugars regularly. If your readings are usually above : or below 70 you should contact our office. 6)  It is important that your Diabetic A1c level is checked every 3 months. 7)  See your eye doctor yearly to check for diabetic eye damage. 8)  Check your feet each night for sore areas, calluses or signs of infection. 9)  Please schedule a follow-up appointment in 1 month. 10)  Limit your Sodium (Salt) to less than 2 grams a day(slightly less than 1/2 a teaspoon) to prevent fluid retention, swelling, or worsening of symptoms. 11)  It is important that you exercise regularly at least 20 minutes 5 times a week. If you develop chest pain, have severe difficulty breathing, or feel very tired , stop exercising immediately and seek medical attention. 12)  You need to lose weight. Consider a lower calorie diet and regular exercise.  13)  Check your blood sugars regularly. If your readings are usually above : or below 70 you should contact our office. 14)  It is important that your Diabetic A1c level is checked every 3 months. 15)  See your eye doctor yearly to check for diabetic eye damage. 16)  Check your feet each night for sore areas, calluses or signs of infection. 17)  Check your Blood Pressure regularly. If it is above: you should make an appointment. Prescriptions: HYDROCHLOROTHIAZIDE 25 MG TABS (HYDROCHLOROTHIAZIDE) Take one tablet daily for blood pressure.  #30 x 2   Entered and Authorized by:   Jason Coop MD   Signed by:   Jason Coop MD on 09/30/2009   Method used:   Electronically to        [facility] (retail)       [address]       [place], [place]  [address]  Ph: [phone]       Fax: [phone]   RxID:   [id number] GLUCOTROL 10 MG TABS (GLIPIZIDE) Take one tablet two times a day.  #60 x 1   Entered and Authorized by:   Jason Coop MD   Signed by:   Jason Coop MD on 09/30/2009   Method used:   Electronically to        [facility] (retail)       [address]       [place], [place]  [address]       Ph: [phone]       Fax: [phone]   RxID:   [id number]    Vital Signs:  Patient profile:   50 year old female Height:      64.5 inches (163.83 cm) Weight:      240.05 pounds (109.11 kg) BMI:     40.71 Temp:     97.4 degrees F (36.33 degrees C) oral Pulse rate:   68 / minute BP sitting:   140 / 81  (right arm) Cuff size:   large  Vitals Entered By: [name] RN (September 30, 2009 2:23 PM)   Prevention & Chronic Care Immunizations   Influenza vaccine: Not documented   Influenza vaccine deferral: Deferred  (08/31/2009)   Influenza vaccine due: 02/11/2010    Tetanus booster: Not documented   Td booster deferral: Deferred  (08/31/2009)    Pneumococcal vaccine: Not documented   Pneumococcal vaccine deferral: Deferred  (08/31/2009)  Other Screening   Pap smear: Not documented    Mammogram: Not documented   Mammogram due: 06/13/2009   Smoking status: quit  (09/30/2009)  Diabetes Mellitus   HgbA1C: 9.2  (09/30/2009)    Eye exam: Not documented   Diabetic eye exam action/deferral: Ophthalmology referral  (08/31/2009)    Foot exam: yes  (06/11/2009)   Foot exam action/deferral: Do today   High risk foot: Not documented   Foot care education: Not documented    Urine microalbumin/creatinine ratio: 4.2  (04/03/2009)    Diabetes flowsheet reviewed?: Yes   Progress toward A1C goal: Unchanged  Lipids   Total Cholesterol: 192  (07/28/2009)   LDL: 133  (07/28/2009)   LDL Direct: Not documented   HDL: 34  (07/28/2009)   Triglycerides:  126  (07/28/2009)    SGOT (AST): 37  (07/28/2009)   SGPT (ALT): 51  (07/28/2009) CMP ordered    Alkaline phosphatase: 73  (07/28/2009)   Total bilirubin: 0.3  (07/28/2009)    Lipid flowsheet reviewed?: Yes   Progress toward LDL goal: Unchanged  Hypertension   Last Blood Pressure: 140 / 81  (09/30/2009)   Serum creatinine: 0.71  (07/28/2009)   Serum potassium 3.8  (07/28/2009) CMP ordered     Hypertension flowsheet reviewed?: Yes   Progress toward BP goal: Unchanged  Self-Management Support :   Personal Goals (by the next clinic visit) :     Personal A1C goal: 7  (04/03/2009)     Personal blood pressure goal: 140/90  (04/03/2009)     Personal LDL goal: 70  (04/03/2009)    Patient will work on the following items until the next clinic visit to reach self-care goals:     Medications and monitoring: take my medicines every day, check my blood sugar, bring all of my medications to every visit, examine my feet every day  (09/30/2009)     Eating: drink diet soda or water  instead of juice or soda, eat more vegetables, use fresh or frozen vegetables, eat foods that are low in salt, eat baked foods instead of fried foods, eat fruit for snacks and desserts, limit or avoid alcohol  (09/30/2009)     Activity: take a 30 minute walk every day  (09/30/2009)    Diabetes self-management support: CBG self-monitoring log, Written self-care plan, Education handout, Resources for patients handout  (09/30/2009)   Diabetes care plan printed   Diabetes education handout printed   Last diabetes self-management training by diabetes educator: 08/18/2009    Hypertension self-management support: Written self-care plan, Education handout, Pre-printed educational material, Referred for self-management class, Resources for patients handout  (09/30/2009)   Hypertension self-care plan printed.   Hypertension education handout printed    Lipid self-management support: Written self-care plan, Education handout,  Pre-printed educational material, Resources for patients handout  (09/30/2009)   Lipid self-care plan printed.   Lipid education handout printed      Resource handout printed.  Process Orders Check Orders Results:     [facility]: ABN not required for this insurance Tests Sent for requisitioning (October 01, 2009 2:17 PM):     09/30/2009: [facility] -- T-Comprehensive Metabolic Panel [[id number]] (signed)     09/30/2009: [facility] -- T-Lipid Profile [phone] (signed)     09/30/2009: [facility] -- T-TSH [phone] (signed)     09/30/2009: [facility] -- T-Urine Microalbumin w/creat. ratio [[id number]] (signed)      Laboratory Results   Blood Tests   Date/Time Received: September 30, 2009 2:41 PM  Date/Time Reported: Burke Keels  September 30, 2009 2:41 PM   HGBA1C: 9.2%   (Normal Range: Non-Diabetic - 3-6%   Control Diabetic - 6-8%) CBG Random:: 138mg /dL      Process Orders Check Orders Results:     [facility]: ABN not required for this insurance Tests Sent for requisitioning (October 01, 2009 2:17 PM):     09/30/2009: [facility] -- T-Comprehensive Metabolic Panel [[id number]] (signed)     09/30/2009: [facility] -- T-Lipid Profile [phone] (signed)     09/30/2009: [facility] -- T-TSH [phone] (signed)     09/30/2009: [facility] -- T-Urine Microalbumin w/creat. ratio [[id number]] (signed)

## 2010-07-15 NOTE — Progress Notes (Signed)
Summary: Refill/gh  Phone Note Refill Request Message from:  Patient on June 14, 2010 11:18 AM  Refills Requested: Medication #1:  Accucheck Aviva Test Strips  Method Requested: Electronic Initial call taken by: [name] RN,  June 14, 2010 11:18 AM    Prescriptions: ACCU-CHEK AVIVA  STRP (GLUCOSE BLOOD) Use as directed.  #100 x 1   Entered and Authorized by:   Whitney Post MD   Signed by:   Whitney Post MD on 06/15/2010   Method used:   Electronically to        [facility] (retail)       [address]       [place], [place]  [address]       Ph: [phone]       Fax: [phone]   RxID:   [id number]

## 2010-07-15 NOTE — Assessment & Plan Note (Signed)
Summary: acute-per mamie/cfb(bowers)   Vital Signs:  Patient profile:   50 year old female Height:      64.5 inches (163.83 cm) Weight:      246.3 pounds (111.95 kg) BMI:     41.77 Temp:     99.2 degrees F (37.33 degrees C) oral Pulse rate:   101 / minute BP sitting:   141 / 94  (left arm)  Vitals Entered By: Stanton Kidney Ditzler RN (January 20, 2010 2:28 PM) Is Patient Diabetic? Yes Did you bring your meter with you today? Yes Pain Assessment Patient in pain? yes     Location: right hip and tailbone Intensity: 10 Type: throbbing Onset of pain  past month Nutritional Status BMI of > 30 = obese Nutritional Status Detail appetite ok CBG Result 198  Have you ever been in a relationship where you felt threatened, hurt or afraid?denies   Does patient need assistance? Functional Status Self care Ambulation Normal Comments Husband and daughter with pt. Pain worse -appt at [facility] 03/2010.   Primary Care Provider:  Whitney Post MD   History of Present Illness: Patient comes today in a lot of back and right leg pain.  Last seen at [facility] for this problem 01/01/10. Patient was given neurotin 800 TID, celebrex 200 BID, and percocet to take q6h.  With this she was able to obtain some relief of her pain for approximately 2 hours.  She describes the pain as constant, starting on her right hip and going down to her right third toes.  She has a long history of low back pain with documented disc disease with L5-S1 right-sided herniation of compression of S1 nerve root.  She was managed with back injections until July 4th of this year when her pain greatly increased. She has no history of trauma.  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  She now has no times when she is without pain.  Her most comfortable positions are when she is on her knees or on her left side.  Pain is worst when she is  standing up straight.  She has started using a walker to walk around her house because of the pain.  She fell recently trying to get into the shower by herself because of the pain.   Patient is unemployed.  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    Patient has h/o HTN, and she is currently only taking the HCTZ prescribed to her - unaware that she was also prescribed an ARB.  No CP, SOB, nausea, vomiting, dizziness, fevers, constipation, diarrhea. No loss of bowel or bladder control.    Depression History:      The patient denies a depressed mood most of the day and a diminished interest in her usual daily activities.         Preventive Screening-Counseling & Management  Alcohol-Tobacco     Smoking Status: quit     Year Quit: 2000  Caffeine-Diet-Exercise     Does Patient Exercise: no     Type of exercise: WALKING/ DANCING     Exercise (avg: min/session):         Times/week:     3  Current Medications (verified): 1)  Metformin Hcl 1000 Mg Tabs (Metformin Hcl) .... Take 1 Tablet By Mouth Two Times A  Day 2)  Glucotrol 10 Mg Tabs (Glipizide) .... Take One Tablet Once A  Day. 3)  Avapro 150 Mg Tabs (Irbesartan) .... Take 1 Tablet By Mouth Once A Day 4)  Hydrochlorothiazide 25 Mg Tabs (Hydrochlorothiazide) .... Take One Tablet Daily For Blood Pressure. 5)  Vitamin D (Ergocalciferol) 50000 Unit Caps (Ergocalciferol) .... Take One Tablet Weekly For 8 Weeks. 6)  Lantus Solostar 100 Unit/ml Soln (Insulin Glargine) .... Inject 35 Units Under The Skin At Bedtime. 7)  Unifine Pentips 31g X 8 Mm Misc (Insulin Pen Needle) .... Use As Directed. 8)  Prilosec 40 Mg Cpdr (Omeprazole) .... Take 1 Pill By Mouth Two Times A Day 9)  Gabapentin 800 Mg Tabs (Gabapentin) .... Take 1 Tablet By Mouth Three Times A Day 10)  Percocet 5-325 Mg Tabs (Oxycodone-Acetaminophen) .... Take 1 Tablet Every 6 Hours As Needed For Pain 11)   Celebrex 200 Mg Caps (Celecoxib) .... Take One Tablet Twice Per Day For Acute Pain 12)  Simvastatin 40 Mg Tabs (Simvastatin) .Marland Kitchen.. 1 Tablet By Mouth Daily 13)  Valacyclovir Hcl 500 Mg Tabs (Valacyclovir Hcl) .... Take One Tablet By Mouth Twice Daily  Allergies (verified): No Known Drug Allergies  Past History:  Past Medical History: chronic back pain 2/2 compression right S1 nerve root hyperlipidemia diabetes HTN Abdominal pain-hospitalized 11/10-neg CT A/P, cardiac enz, FOBT, h. pylori ab, UA, lipase, HIDA. Mild transaminitis-CT c/w hepatic steatosis  Past Surgical History: Reviewed history from 04/15/2009 and no changes required. hysterectomy  Family History: Reviewed history from 12/09/2008 and no changes required. Father died at 26 from MI.  Family History Diabetes 1st degree relative  Social History: Unemployed. Lives with husband and daughter. Uninsured.  Review of Systems       See HPI  Physical Exam  General:  obese woman, teary eyed through much of the exam, particularly during the MSK exam Nose:  no external erythema and no nasal discharge.   Mouth:  pharynx pink and moist.   Lungs:  normal respiratory effort, no accessory muscle use, normal breath sounds, no crackles, and no wheezes.   Heart:  normal rate, regular rhythm, and no murmur.   Abdomen:  soft, non-tender, and normal bowel sounds.   Msk:  Patient with tenderness to palpation of center of right buttock adjacent to sacrum.  Normal ROM of joints. Neurologic:  alert & oriented X3.  CN grossly intact.  Strength 5/5 left lower extremities.   Strength: LLE = 5/5 right hip flexor = 5/5 right quad = 5/5 right hamstring = 4/5 right dorsiflexion of foot = 4/5 right plantarflexion of foot = 4/5 Psych:  Patient teary eyed during exam.   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  Has  appointment to see neurosurgery in October at [facility].  Now patient has evidence of muscle weakness on exam, so this is much more concerning for possible permanent nerve damage from compression and needs emergent evaluation.  We will have the patient go to the emergency room this afternoon so that she can be evaluated by neurosurgery.  For further pain control, the patient will need shortening of the interval of the percocet to q4 hours; we will suggest continuation of the neurotin 800 three times a day and celebrex 200 twice daily. However, we will defer further prescriptions to the patient until after she has been evaluated at the ED and by neurosurgery today.  Problem # 2:  HYPERTENSION (ICD-401.9) Patient has not recieved her avapro.  We suspect this (and her level of pain) contributed to her higher than normal BP today.  She states that [facility] informed her she has some medicines that have not yet come in, and one of these is likely the avapro.  Her updated medication list for this problem includes:    Avapro 150 Mg Tabs (Irbesartan) .Marland Kitchen... Take 1 tablet by mouth once a day    Hydrochlorothiazide 25 Mg Tabs (Hydrochlorothiazide) .Marland Kitchen... Take one tablet daily for blood pressure.  Problem # 3:  GENITAL HERPES (ICD-054.10) Patient has h/o genital herpes, diagnosed a year ago. We did not record of this in our records. She states that she is currently having a breakout but she is out of the valacyclovir prescribed to her last year by Dr. Allyne Gee.  We will refill her prescription for this  today.  Complete Medication List: 1)  Metformin Hcl 1000 Mg Tabs (Metformin hcl) .... Take 1 tablet by mouth two times a day 2)  Glucotrol 10 Mg Tabs (Glipizide) .... Take one tablet once a  day. 3)  Avapro 150 Mg Tabs (Irbesartan) .... Take 1 tablet by mouth once a day 4)  Hydrochlorothiazide 25 Mg Tabs (Hydrochlorothiazide) .... Take one tablet daily for blood pressure. 5)  Vitamin D  (ergocalciferol) 50000 Unit Caps (Ergocalciferol) .... Take one tablet weekly for 8 weeks. 6)  Lantus Solostar 100 Unit/ml Soln (Insulin glargine) .... Inject 35 units under the skin at bedtime. 7)  Unifine Pentips 31g X 8 Mm Misc (Insulin pen needle) .... Use as directed. 8)  Prilosec 40 Mg Cpdr (Omeprazole) .... Take 1 pill by mouth two times a day 9)  Gabapentin 800 Mg Tabs (Gabapentin) .... Take 1 tablet by mouth three times a day 10)  Percocet 5-325 Mg Tabs (Oxycodone-acetaminophen) .... Take 1 tablet every 4 hours as needed for pain 11)  Celebrex 200 Mg Caps (Celecoxib) .... Take one tablet twice per day for acute pain 12)  Simvastatin 40 Mg Tabs (Simvastatin) .Marland Kitchen.. 1 tablet by mouth daily 13)  Valacyclovir Hcl 500 Mg Tabs (Valacyclovir hcl) .... Take one tablet by mouth twice daily  Other Orders: Capillary Blood Glucose/CBG (16109)  Patient Instructions: 1)  Please go to the emergency room today for evaluation. 2)  Please take your medicines as directed. Prescriptions: VALACYCLOVIR HCL 500 MG TABS (VALACYCLOVIR HCL) take one tablet by mouth twice daily  #60 x 1   Entered and Authorized by:   Danelle Berry, MD   Signed by:   Danelle Berry, MD on 01/20/2010   Method used:   Print then Give to Patient   RxID:   [id number]    Prevention & Chronic Care Immunizations   Influenza vaccine: Not documented   Influenza vaccine deferral: Deferred  (08/31/2009)   Influenza vaccine due: 02/11/2010    Tetanus booster: Not documented   Td booster deferral: Deferred  (08/31/2009)    Pneumococcal vaccine: Not documented   Pneumococcal vaccine deferral: Deferred  (08/31/2009)  Other Screening   Pap smear: Not documented    Mammogram: Not documented   Mammogram due: 06/13/2009   Smoking status: quit  (01/20/2010)  Diabetes Mellitus   HgbA1C: 8.9  (12/17/2009)    Eye exam: Not documented   Diabetic eye exam action/deferral: Ophthalmology referral  (08/31/2009)    Foot  exam: yes  (06/11/2009)   Foot exam action/deferral: Do today   High risk foot: Not documented  Foot care education: Not documented    Urine microalbumin/creatinine ratio: 6.6  (09/30/2009)  Lipids   Total Cholesterol: 184  (11/23/2009)   LDL: 122  (11/23/2009)   LDL Direct: Not documented   HDL: 35  (11/23/2009)   Triglycerides: 133  (11/23/2009)    SGOT (AST): 35  (11/23/2009)   SGPT (ALT): 38  (11/23/2009)   Alkaline phosphatase: 64  (11/23/2009)   Total bilirubin: 0.3  (11/23/2009)  Hypertension   Last Blood Pressure: 141 / 94  (01/20/2010)   Serum creatinine: 0.65  (11/23/2009)   Serum potassium 3.7  (11/23/2009)  Self-Management Support :   Personal Goals (by the next clinic visit) :     Personal A1C goal: 7  (04/03/2009)     Personal blood pressure goal: 140/90  (04/03/2009)     Personal LDL goal: 70  (04/03/2009)    Patient will work on the following items until the next clinic visit to reach self-care goals:     Medications and monitoring: take my medicines every day, check my blood sugar, check my blood pressure, bring all of my medications to every visit, weigh myself weekly, examine my feet every day  (01/20/2010)     Eating: drink diet soda or water instead of juice or soda, eat more vegetables, eat foods that are low in salt, eat fruit for snacks and desserts, limit or avoid alcohol  (01/20/2010)     Activity: take a 30 minute walk every day  (12/17/2009)    Diabetes self-management support: Written self-care plan, Education handout  (01/20/2010)   Diabetes care plan printed   Diabetes education handout printed   Last diabetes self-management training by diabetes educator: 10/19/2009    Hypertension self-management support: Written self-care plan, Education handout  (01/20/2010)   Hypertension self-care plan printed.   Hypertension education handout printed    Lipid self-management support: Written self-care plan, Education handout  (01/20/2010)   Lipid  self-care plan printed.   Lipid education handout printed   Appended Document: acute-per mamie/cfb(bowers) Taken to ER via w/c with family per Dr Claudette Laws at [facility].  Appended Document: acute-per mamie/cfb(bowers) I saw and examined Ms Nardo with Dr Claudette Laws and agree with her note. Pt had objective weakness but difficult to tell if 2/2 pain. Agree with sending to ER so can be evaluated by NS and determine if any acute need other than pain control. If pt D/C'd from ER, she is to call us so we can refill narc. Dr Rogelia Boga

## 2010-07-15 NOTE — Progress Notes (Signed)
Summary: refill/ hla  Phone Note Refill Request Message from:  Fax from Pharmacy on December 25, 2009 3:23 PM  Refills Requested: Medication #1:  PRILOSEC 40 MG CPDR take 1 pill by mouth two times a day   Last Refilled: 11/22 Initial call taken by: Marin Roberts RN,  December 25, 2009 3:24 PM  Follow-up for Phone Call        Refill approved-nurse to complete Follow-up by: Julaine Fusi  DO,  December 25, 2009 4:22 PM    Prescriptions: PRILOSEC 40 MG CPDR (OMEPRAZOLE) take 1 pill by mouth two times a day  #30 x 6   Entered and Authorized by:   Julaine Fusi  DO   Signed by:   Julaine Fusi  DO on 12/25/2009   Method used:   Faxed to ...       [facility] (retail)       [address]       [place], [place]  [address]       Ph: [phone]       Fax: [phone]   RxID:   [id number]

## 2010-07-15 NOTE — Assessment & Plan Note (Signed)
Summary: 2wk f/u/pokharel/vs   Vital Signs:  Patient profile:   50 year old female Height:      64.5 inches (163.83 cm) Weight:      251.0 pounds (114.09 kg) BMI:     42.57 Temp:     97.2 degrees F (36.22 degrees C) oral Pulse rate:   81 / minute BP sitting:   157 / 88  (right arm)  Vitals Entered By: Stanton Kidney Ditzler RN (June 25, 2009 9:03 AM) Is Patient Diabetic? Yes Did you bring your meter with you today? Yes Pain Assessment Patient in pain? yes     Location: leg and back Intensity: 8 Type: aching Onset of pain  past 3 weeks Nutritional Status BMI of 25 - 29 = overweight Nutritional Status Detail appetite good CBG Result 227  Have you ever been in a relationship where you felt threatened, hurt or afraid?denies   Does patient need assistance? Functional Status Self care Ambulation Normal Comments FU - sch to have tonsil removed 07/09/09 Dr Marvetta Gibbons.   Primary Care Provider:  Jason Coop MD   History of Present Illness: This is a 50 year old woman with past medical history of   hyperlipidemia diabetes HTN Abdominal pain-hospitalized 11/10-neg CT A/P, cardiac enz, FOBT, h. pylori ab, UA, lipase, HIDA. Mild transaminitis-CT c/w hepatic steatosis  She is here for 2 week fu after urgent referal to ENT for asymetric enlargement of tonsils.  She is scheduled to have her tonsils out in the near future.  Also here for BP check, lisinipril 5mg  was started at last appointment.  Also here for DM check up, A1C.  Is having low back pain which radiates to the left leg.  No weakness or numbness.      Depression History:      The patient denies a depressed mood most of the day and a diminished interest in her usual daily activities.         Preventive Screening-Counseling & Management  Alcohol-Tobacco     Smoking Status: quit     Year Quit: 2000  Caffeine-Diet-Exercise     Does Patient Exercise: yes     Type of exercise: WALKING/ DANCING     Exercise  (avg: min/session):         Times/week:     3  Current Medications (verified): 1)  One Daily Womens  Tabs (Multiple Vitamins-Minerals) 2)  Metformin Hcl 1000 Mg Tabs (Metformin Hcl) .... Take 1 Tablet By Mouth Two Times A Day 3)  Nexium 40 Mg Cpdr (Esomeprazole Magnesium) .... Take 1 Tablet By Mouth Once A Day 4)  Glucotrol 5 Mg Tabs (Glipizide) .... Take 1 Tablet By Mouth Two Times A Day 5)  Pravachol 20 Mg Tabs (Pravastatin Sodium) .... Take 1 Pill By Mouth Daily. 6)  Ultram 50 Mg Tabs (Tramadol Hcl) .... Take 1 Tablet By Mouth Every 8 Hours As Needed For Pain 7)  Lisinopril 5 Mg Tabs (Lisinopril) .... Take 1 Pill By Mouth Daily.  Allergies (verified): No Known Drug Allergies  Review of Systems       per hpi  Physical Exam  General:  alert and overweight-appearing.   Mouth:  pharynx pink and moist.  tonsillar hypertrophy with R>L.  no erythema or exudate. Lungs:  normal respiratory effort and normal breath sounds.   Heart:  normal rate, regular rhythm, and no murmur.   Pulses:  2+ Extremities:  no edema Neurologic:  alert & oriented X3, cranial nerves II-XII intact, strength  normal in all extremities, and gait normal.   Skin:  no suspicious lesions.   Psych:  Oriented X3, memory intact for recent and remote, normally interactive, good eye contact, and not anxious appearing.     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  Problem # 2:  ESSENTIAL HYPERTENSION, BENIGN (ICD-401.1) Some improvement with lisinopril.  Will increase dose to 10mg  and have rtc in 2 weeks for BP recheck and BMET.  Her updated medication list for this problem includes:    Lisinopril 10 Mg Tabs (Lisinopril) .Marland Kitchen... Take one tablet daily for blood pressure.  BP today: 157/88 Prior BP: 177/99 (06/11/2009)  Labs Reviewed: K+: 4.1 (06/12/2009) Creat: : 0.64 (06/12/2009)   Chol: 187 (06/12/2009)   HDL: 33  (06/12/2009)   LDL: 131 (06/12/2009)   TG: 114 (06/12/2009)  Problem # 3:  DM (ICD-250.00) A1C is up today from 7.5 to 8.0. REports that her back pain is keeping her from exercising and she has been stress eating. She goes to [employer] and likes it.  Would be interested in an appointment with DR. Reports taking meds as directed and eating well. Will increase her glucotrol to 10mg  two times a day.    Her updated medication list for this problem includes:    Metformin Hcl 1000 Mg Tabs (Metformin hcl) .Marland Kitchen... Take 1 tablet by mouth two times a day    Glucotrol 10 Mg Tabs (Glipizide) .Marland Kitchen... Take one tablet two times a day.    Lisinopril 10 Mg Tabs (Lisinopril) .Marland Kitchen... Take one tablet daily for blood pressure.  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  had been going to [employer] and had injections at [facility] imaging.  Will suggest that she try NSAIDS and go back to them.  Complete Medication List: 1)  One Daily Womens Tabs (Multiple vitamins-minerals) 2)  Metformin Hcl 1000 Mg Tabs (Metformin hcl) .... Take 1 tablet by mouth two times a day 3)  Nexium 40 Mg Cpdr (Esomeprazole magnesium) .... Take 1 tablet by mouth once a day 4)  Glucotrol 10 Mg Tabs (Glipizide) .... Take one tablet two times a day. 5)  Pravachol 20 Mg Tabs (Pravastatin sodium) .... Take 1 pill by mouth daily. 6)  Ultram 50 Mg Tabs (Tramadol hcl) .... Take 1 tablet by mouth every 8 hours as needed for pain 7)  Lisinopril 10 Mg Tabs (Lisinopril) .... Take one tablet daily for blood pressure.  Patient Instructions: 1)  Please schedule a follow-up appointment in 2 weeks for blood pressure recheck. 2)  You have new doses of lisinopril and glucotrol.  Prescriptions [facility] at [facility] on [address]  You can take two 5mg  tablets until you run out of pills. 3)  You should call  your orthopedist for an appointment for back and leg pain.  Until then you could try ibuprofen or aleve. Prescriptions: LISINOPRIL 10 MG TABS (LISINOPRIL) Take one tablet daily for blood pressure.  #32 x 3   Entered and Authorized by:   Elby Showers MD   Signed by:   Elby Showers MD on 06/25/2009   Method used:   Electronically to        [facility]  [address] [phone]* (retail)       [address]  [place], [place]  [id number]       Ph: [phone] or [phone]       Fax: [phone]   RxID:   [id number] GLUCOTROL 10 MG TABS (GLIPIZIDE) Take one tablet two times a day.  #64 x 3   Entered and Authorized by:   Elby Showers MD   Signed by:   Elby Showers MD on 06/25/2009   Method used:   [facility]        [facility]  [address] [phone]* (retail)       [address]       [place], [place]  [id number]       Ph: [phone] or [phone]       Fax: [phone]   RxID:   [id number]    Prevention & Chronic Care Immunizations   Influenza vaccine: Not documented   Influenza vaccine due: 02/11/2010    Tetanus booster: Not documented    Pneumococcal vaccine: Not documented  Other Screening   Pap smear: Not documented    Mammogram: Not documented   Mammogram due: 06/13/2009   Smoking status: quit  (06/25/2009)  Diabetes Mellitus   HgbA1C: 8.0  (06/25/2009)    Eye exam: Not documented    Foot exam: yes  (06/11/2009)   Foot exam action/deferral: Do today   High risk foot: Not documented   Foot care education: Not documented    Urine microalbumin/creatinine ratio: 4.2  (04/03/2009)  Lipids   Total Cholesterol: 187  (06/12/2009)   LDL: 131  (06/12/2009)   LDL Direct: Not documented   HDL: 33  (06/12/2009)   Triglycerides: 114  (06/12/2009)    SGOT (AST): 99  (06/12/2009)   SGPT (ALT): 76  (06/12/2009)   Alkaline phosphatase: 65  (06/12/2009)   Total bilirubin: 0.3   (06/12/2009)  Hypertension   Last Blood Pressure: 157 / 88  (06/25/2009)   Serum creatinine: 0.64  (06/12/2009)   Serum potassium 4.1  (06/12/2009)  Self-Management Support :   Personal Goals (by the next clinic visit) :     Personal A1C goal: 7  (04/03/2009)     Personal blood pressure goal: 140/90  (04/03/2009)     Personal LDL goal: 70  (04/03/2009)    Patient will work on the following items until the next clinic visit to reach self-care goals:     Medications and monitoring: take my medicines every day, check my blood sugar, check my blood pressure, bring all of my medications to every visit, weigh myself weekly, examine my feet every day  (06/25/2009)     Eating: drink diet soda or water instead of juice or soda, eat more vegetables, use fresh or frozen vegetables, eat foods that are low in salt, eat baked foods instead of fried foods, eat fruit for snacks and desserts, limit or avoid alcohol  (06/25/2009)     Activity: take a 30 minute walk every day  (06/25/2009)    Diabetes self-management support: Copy of home glucose meter record, Written self-care plan  (06/11/2009)   Last diabetes self-management training by diabetes educator: 01/08/2009    Hypertension self-management support: Written self-care plan  (06/11/2009)    Lipid self-management support: Written self-care plan  (06/11/2009)   Laboratory Results   Blood Tests   Date/Time Received: June 25, 2009 9:19 AM  Date/Time Reported: Burke Keels  June 25, 2009 9:19 AM   HGBA1C: 8.0%   (  Normal Range: Non-Diabetic - 3-6%   Control Diabetic - 6-8%) CBG Random:: 227mg /dL

## 2010-07-15 NOTE — Letter (Signed)
Summary: ACCU-CHEK  ACCU-CHEK   Imported By: Margie Billet 04/30/2010 10:36:02  _____________________________________________________________________  External Attachment:    Type:   Image     Comment:   External Document

## 2010-07-15 NOTE — Letter (Signed)
Summary: [name] /ORTHOPEDIC  [name] [name] /ORTHOPEDIC   Imported By: Margie Billet 10/09/2009 11:45:42  _____________________________________________________________________  External Attachment:    Type:   Image     Comment:   External Document

## 2010-07-15 NOTE — Progress Notes (Signed)
Summary: lisinopril/ hla  Phone Note Call from Patient   Summary of Call: pt states that since the last visit she has started to cough and have a tickle in her throat, this started 2-3 days ago, she feels it is r/t the increase of the lisinopril. an appt was made for her on 1/25 w/ you. please advise Initial call taken by: Marin Roberts RN,  July 01, 2009 3:05 PM  Follow-up for Phone Call        I agree with an appointment.  Follow-up by: Jason Coop MD,  July 01, 2009 11:10 PM

## 2010-07-15 NOTE — Progress Notes (Signed)
Summary: refill/ hla  Phone Note Refill Request Message from:  Patient on January 08, 2010 4:25 PM  Refills Requested: Medication #1:  CELEBREX 200 MG CAPS Take one tablet twice per day for acute pain.   Dosage confirmed as above?Dosage Confirmed Initial call taken by: Marin Roberts RN,  January 08, 2010 4:25 PM  Follow-up for Phone Call        looks like Dr. Odis Luster did this. Follow-up by: Ulyess Mort MD,  January 08, 2010 4:59 PM

## 2010-07-15 NOTE — Letter (Signed)
Summary: MeterDownLoad  MeterDownLoad   Imported By: Florinda Marker 09/02/2009 15:13:21  _____________________________________________________________________  External Attachment:    Type:   Image     Comment:   External Document

## 2010-07-15 NOTE — Letter (Signed)
Summary: Pharmacologist   Imported By: Florinda Marker 06/30/2009 09:35:54  _____________________________________________________________________  External Attachment:    Type:   Image     Comment:   External Document

## 2010-07-15 NOTE — Assessment & Plan Note (Signed)
Summary: EST-1 MONTH F/U VISIT/CH   Vital Signs:  Patient profile:   50 year old female Height:      64.5 inches (163.83 cm) Weight:      248.3 pounds (112.86 kg) BMI:     42.11 Temp:     97.3 degrees F (36.28 degrees C) oral Pulse rate:   79 / minute BP sitting:   110 / 74  (left arm)  Vitals Entered By: Crystal Kidney Ditzler RN (April 28, 2010 3:57 PM) Is Patient Diabetic? Yes Did you bring your meter with you today? Yes Pain Assessment Patient in pain? yes     Location: back Intensity: 9 Type: sharp Onset of pain  long time Nutritional Status BMI of > 30 = obese Nutritional Status Detail appetite good CBG Result 156  Have you ever been in a relationship where you felt threatened, hurt or afraid?denies   Does patient need assistance? Functional Status Self care Ambulation Wheelchair Comments Husband with pt. FU - [facility] will sch back surgery soon. Refills on meds.   Primary Care Provider:  Whitney Post MD   History of Present Illness: [name] presents today for follow-up of: 1) back pain. 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 Increasing back pain and pain that radiates down R leg have caused her increasing disability over the past 6+ months, confining her currently to a wheelchair. She is in excellent spirits today, however, because she was recently seen by a neurosurgeon at [facility] who says that she will be scheduled for surgery within 30 days. According to the patient and her husband, the surgeon told them that she should expect some significant pain relief from the surgery. Her pain is currently controlled with Percocet and Neurontin.  2) DM2. When I last saw her in July, we increased insulin dose and emphasized improved diabetic control in anticipation of possible spine surgery. She has been checking glucose at home,  which typically runs in the 200s and never below 100. She is motivated to improve glycemic control in the hopes that it will help her heal from surgery.   Depression History:      The patient denies a depressed mood most of the day and a diminished interest in her usual daily activities.         Preventive Screening-Counseling & Management  Alcohol-Tobacco     Smoking Status: quit     Year Quit: 2000  Caffeine-Diet-Exercise     Does Patient Exercise: no     Type of exercise: WALKING/ DANCING     Exercise (avg: min/session):         Times/week:     3  Current Medications (verified): 1)  Metformin Hcl 1000 Mg Tabs (Metformin Hcl) .... Take 1 Tablet By Mouth Two Times A Day 2)  Glucotrol 10 Mg Tabs (Glipizide) .... Take One Tablet Once A  Day. 3)  Avapro 150 Mg Tabs (Irbesartan) .... Take 1 Tablet By Mouth Once A Day 4)  Hydrochlorothiazide 25 Mg Tabs (Hydrochlorothiazide) .... Take One Tablet Daily For Blood Pressure. 5)  Lantus Solostar 100 Unit/ml Soln (Insulin Glargine) .... Inject 40 Units Under The Skin At Bedtime. 6)  Unifine Pentips 31g X 8 Mm Misc (Insulin Pen Needle) .... Use As Directed. 7)  Gabapentin 800 Mg Tabs (Gabapentin) .... Take 1 Tablet By Mouth Three Times A Day 8)  Oxycodone-Acetaminophen 5-325  Mg Tabs (Oxycodone-Acetaminophen) .... Take One Pill By Mouth Every 4 Hours As Needed For Pain. 9)  Celebrex 200 Mg Caps (Celecoxib) .... Take One Tablet Twice Per Day For Acute Pain 10)  Simvastatin 40 Mg Tabs (Simvastatin) .Marland Kitchen.. 1 Tablet By Mouth Daily 11)  Nexium 40 Mg Cpdr (Esomeprazole Magnesium) .... Take One Tablet By Mouth Once A Day 12)  Ear Wax Drops 6.5 % Soln (Carbamide Peroxide) .... Two Times A Day in Both Ears  Allergies: No Known Drug Allergies  Past History:  Past Medical History: Last updated: 01/20/2010 chronic back pain 2/2 compression right S1 nerve root hyperlipidemia diabetes HTN Abdominal pain-hospitalized 11/10-neg CT A/P, cardiac enz,  FOBT, h. pylori ab, UA, lipase, HIDA. Mild transaminitis-CT c/w hepatic steatosis  Family History: Last updated: 08-Jan-2009 Father died at 89 from MI.  Family History Diabetes 1st degree relative  Social History: Last updated: 01/20/2010 Unemployed. Lives with husband and daughter. Uninsured.  Review of Systems      See HPI General:  Denies chills and fever. Eyes:  Denies blurring. ENT:  Denies sore throat. CV:  Denies chest pain or discomfort. Resp:  Denies cough and shortness of breath. GI:  Denies abdominal pain and change in bowel habits. GU:  Denies urinary frequency. MS:  Complains of low back pain. Derm:  Denies rash. Neuro:  Denies headaches, numbness, and visual disturbances. Psych:  Denies depression. Endo:  Denies weight change.  Physical Exam  General:  alert and cooperative to examination. Sitting in wheelchair in no acute distress.  Head:  normocephalic and atraumatic.   Eyes:  vision grossly intact, pupils equal, pupils round, and pupils reactive to light.   Ears:  R ear normal and L ear normal.   Mouth:  pharynx pink and moist.   Neck:  supple and no masses.   Chest Wall:  no deformities and no mass.   Lungs:  normal respiratory effort, normal breath sounds, no crackles, and no wheezes.   Heart:  normal rate, regular rhythm, no murmur, no gallop, and no rub.   Abdomen:  soft and non-tender.   Msk:  no joint swelling and no joint warmth. Mobility limited by persistent low back pain.  Extremities:  No swelling, cyanosis, or edema.  Neurologic:  alert & oriented X3.   Skin:  turgor normal and no rashes.   Psych:  memory intact for recent and remote, normally interactive, good eye contact, not anxious appearing, and not depressed appearing.    Diabetes Management Exam:    Foot Exam (with socks and/or shoes not present):       Sensory-Monofilament:          Left foot: normal          Right foot: normal   Impression & Recommendations:  Problem # 1:  DM  (ICD-250.00) HbA1C still not at goal. Increased Lantus insulin to 40 units at bedtime. Encouraged her to continue checking blood glucose regularly. Reinforced the importance of adhering to diabetic diet.   Her updated medication list for this problem includes:    Metformin Hcl 1000 Mg Tabs (Metformin hcl) .Marland Kitchen... Take 1 tablet by mouth two times a day    Glucotrol 10 Mg Tabs (Glipizide) .Marland Kitchen... Take one tablet once a  day.    Avapro 150 Mg Tabs (Irbesartan) .Marland Kitchen... Take 1 tablet by mouth once a day    Lantus Solostar 100 Unit/ml Soln (Insulin glargine) ..... Inject 40 units under the skin at bedtime.  Problem # 2:  HERNIATED LUMBAR DISC (ICD-722.10) Will continue Percocet and Neurontin for pain management while Ms. Koudelka awaits back surgery. Will request records from neurosurgeon at [facility]. Patient and husband have also said that they will call to let us know when she has the surgery. I am hopeful that surgery will provide her with some symptom relief.   Problem # 3:  HYPERTENSION (ICD-401.9) Well controlled. Continue current management.   Her updated medication list for this problem includes:    Avapro 150 Mg Tabs (Irbesartan) .Marland Kitchen... Take 1 tablet by mouth once a day    Hydrochlorothiazide 25 Mg Tabs (Hydrochlorothiazide) .Marland Kitchen... Take one tablet daily for blood pressure.  Complete Medication List: 1)  Metformin Hcl 1000 Mg Tabs (Metformin hcl) .... Take 1 tablet by mouth two times a day 2)  Glucotrol 10 Mg Tabs (Glipizide) .... Take one tablet once a  day. 3)  Avapro 150 Mg Tabs (Irbesartan) .... Take 1 tablet by mouth once a day 4)  Hydrochlorothiazide 25 Mg Tabs (Hydrochlorothiazide) .... Take one tablet daily for blood pressure. 5)  Lantus Solostar 100 Unit/ml Soln (Insulin glargine) .... Inject 40 units under the skin at bedtime. 6)  Unifine Pentips 31g X 8 Mm Misc (Insulin pen needle) .... Use as directed. 7)  Gabapentin 800 Mg Tabs (Gabapentin) .... Take 1 tablet by mouth three times a  day 8)  Oxycodone-acetaminophen 5-325 Mg Tabs (Oxycodone-acetaminophen) .... Take one pill by mouth every 4 hours as needed for pain. 9)  Celebrex 200 Mg Caps (Celecoxib) .... Take one tablet twice per day for acute pain 10)  Simvastatin 40 Mg Tabs (Simvastatin) .Marland Kitchen.. 1 tablet by mouth daily 11)  Nexium 40 Mg Cpdr (Esomeprazole magnesium) .... Take one tablet by mouth once a day 12)  Ear Wax Drops 6.5 % Soln (Carbamide peroxide) .... Two times a day in both ears  Other Orders: Capillary Blood Glucose/CBG ([address])  Patient Instructions: 1)  Please schedule a follow-up appointment in 2 months. 2)  As discussed, please increase your daily dose of Lantus insulin to 40 units.  3)  Check your feet each night for sore areas, calluses or signs of infection. 4)  Check your blood sugars regularly. If your readings are usually above : or below 70 you should contact our office. Prescriptions: OXYCODONE-ACETAMINOPHEN 5-325 MG TABS (OXYCODONE-ACETAMINOPHEN) Take one pill by mouth every 4 hours as needed for pain.  #120 x 0   Entered and Authorized by:   Whitney Post MD   Signed by:   Whitney Post MD on 04/28/2010   Method used:   Print then Give to Patient   RxID:   [id number] LANTUS SOLOSTAR 100 UNIT/ML SOLN (INSULIN GLARGINE) Inject 40 units under the skin at bedtime.  #1 mo supply x 11   Entered and Authorized by:   Whitney Post MD   Signed by:   Whitney Post MD on 04/28/2010   Method used:   Faxed to ...       [facility] (retail)       [address]       [place], [place]  [address]       Ph: [phone]       Fax: [phone]   RxID:   [id number] GABAPENTIN 800 MG TABS (GABAPENTIN) Take 1 tablet by mouth three times a day  #90 x 6   Entered and Authorized by:   Whitney Post MD   Signed by:   Whitney Post MD on 04/28/2010  Method used:   Faxed to ...       [facility] (retail)       [address]       [place], [place]  [address]       Ph: [phone]       Fax: [phone]   RxID:   [id number]    Orders Added: 1)  Capillary Blood Glucose/CBG [82948] 2)  Est. Patient Level IV [40102]     Prevention & Chronic Care Immunizations   Influenza vaccine: Fluvax Non-MCR  (02/12/2010)   Influenza vaccine deferral: Deferred  (08/31/2009)   Influenza vaccine due: 02/11/2010    Tetanus booster: Not documented   Td booster deferral: Deferred  (08/31/2009)    Pneumococcal vaccine: Not documented   Pneumococcal vaccine deferral: Deferred  (08/31/2009)  Other Screening   Pap smear: Not documented   Pap smear action/deferral: Deferred  (04/28/2010)    Mammogram: Not documented   Mammogram action/deferral: Deferred  (04/28/2010)   Mammogram due: 06/13/2009   Smoking status: quit  (04/28/2010)  Diabetes Mellitus   HgbA1C: 8.5  (03/25/2010)    Eye exam: Not documented   Diabetic eye exam action/deferral: Ophthalmology referral  (08/31/2009)    Foot exam: yes  (04/28/2010)   Foot exam action/deferral: Do today   High risk foot: Yes  (04/28/2010)   Foot care education: Done  (04/28/2010)    Urine microalbumin/creatinine ratio: 6.6  (09/30/2009)    Diabetes flowsheet reviewed?: Yes   Progress toward A1C goal: Improved  Lipids   Total Cholesterol: 184  (11/23/2009)   LDL: 122  (11/23/2009)   LDL Direct: Not documented   HDL: 35  (11/23/2009)   Triglycerides: 133  (11/23/2009)    SGOT (AST): 35  (11/23/2009)   SGPT (ALT): 38  (11/23/2009)   Alkaline phosphatase: 64  (11/23/2009)   Total bilirubin: 0.3  (11/23/2009)    Lipid flowsheet reviewed?: Yes   Progress toward LDL goal: Improved  Hypertension   Last Blood Pressure: 110 / 74  (04/28/2010)   Serum creatinine: 0.65  (11/23/2009)   Serum potassium 3.7  (11/23/2009)    Hypertension flowsheet reviewed?: Yes   Progress toward BP goal: At goal  Self-Management Support :   Personal Goals (by the next clinic  visit) :     Personal A1C goal: 7  (04/03/2009)     Personal blood pressure goal: 140/90  (04/03/2009)     Personal LDL goal: 70  (04/03/2009)    Patient will work on the following items until the next clinic visit to reach self-care goals:     Medications and monitoring: take my medicines every day, check my blood sugar, check my blood pressure, bring all of my medications to every visit, weigh myself weekly, examine my feet every day  (04/28/2010)     Eating: drink diet soda or water instead of juice or soda, eat more vegetables, use fresh or frozen vegetables, eat foods that are low in salt, eat baked foods instead of fried foods, eat fruit for snacks and desserts, limit or avoid alcohol  (04/28/2010)     Activity: take a 30 minute walk every day  (02/12/2010)    Diabetes self-management support: Copy of home glucose meter record, Written self-care plan, Education handout, Resources for patients handout  (04/28/2010)   Diabetes care plan printed   Diabetes education handout printed   Last diabetes self-management training by diabetes educator: 10/19/2009    Hypertension self-management support: Written self-care  plan, Education handout, Resources for patients handout  (04/28/2010)   Hypertension self-care plan printed.   Hypertension education handout printed    Lipid self-management support: Written self-care plan, Education handout, Resources for patients handout  (04/28/2010)   Lipid self-care plan printed.   Lipid education handout printed      Resource handout printed.   Last LDL:                                                 122 (11/23/2009 7:53:00 PM)        Diabetic Foot Exam Foot Inspection Is there a history of a foot ulcer?              No Is there a foot ulcer now?              No Can the patient see the bottom of their feet?          Yes Are the shoes appropriate in style and fit?          Yes Is there swelling or an abnormal foot shape?          No Are the  toenails long?                No Are the toenails thick?                No Are the toenails ingrown?              No Is there heavy callous build-up?              No Is there a claw toe deformity?                          No Is there elevated skin temperature?            No Is there limited ankle dorsiflexion?            No Is there foot or ankle muscle weakness?            No Do you have pain in calf while walking?           No      Diabetic Foot Care Education :Patient educated on appropriate care of diabetic feet.  Pulse Check          Right Foot          Left Foot Posterior Tibial:        2+            2+ Dorsalis Pedis:        2+            2+  High Risk Feet? Yes   10-g (5.07) Semmes-Weinstein Monofilament Test Performed by: Crystal Kidney Ditzler RN          Right Foot          Left Foot Visual Inspection     normal         normal Test Control      normal         normal Site 1         normal         normal Site 2         normal  normal Site 3         normal         normal Site 4         normal         normal Site 5         normal         normal Site 6         normal         normal Site 7         normal         normal Site 8         normal         normal Site 9         normal         normal Site 10         normal         normal  Impression      normal         normal

## 2010-07-15 NOTE — Progress Notes (Signed)
Summary: Refill/gh  Phone Note Refill Request Message from:  Patient on June 01, 2010 10:43 AM  Refills Requested: Medication #1:  Accucheck Aviva strips Last visit and CBG was 04/28/2010.   Method Requested: Fax to Local Pharmacy Initial call taken by: [name] RN,  June 01, 2010 10:44 AM  Follow-up for Phone Call        Rx faxed to pharmacy. Follow-up by: Margarito Liner MD,  June 01, 2010 11:05 AM    New/Updated Medications: ACCU-CHEK AVIVA  STRP (GLUCOSE BLOOD) Use as directed. Prescriptions: ACCU-CHEK AVIVA  STRP (GLUCOSE BLOOD) Use as directed.  #100 x 1   Entered and Authorized by:   Margarito Liner MD   Signed by:   Margarito Liner MD on 06/01/2010   Method used:   Faxed to ...       [facility] (retail)       [address]       [place], [place]  [address]       Ph: [phone]       Fax: [phone]   RxID:   [id number]

## 2010-07-15 NOTE — Assessment & Plan Note (Signed)
Summary: extreme makeover:lifestyle class/VS   Allergies: No Known Drug Allergies Patient attended a 1 hour Extreme Makeover: Lifestyle meeting today. The meeting included information about: cooking- a food demo, healthy eating (portion control, lower and healthy fat,  high fiber and more fruits and vegetables), weight loss tips, and how to start a walking program and how to use a pedometer to track walking progress .   Please ask patient what they learned at their next visit.   Thank you for the referral.

## 2010-07-15 NOTE — Miscellaneous (Signed)
Summary: [facility]ERMINATION [facility]  [facility]   Imported By: Margie Billet 03/25/2010 14:22:04  _____________________________________________________________________  External Attachment:    Type:   Image     Comment:   External Document

## 2010-07-15 NOTE — Progress Notes (Signed)
Summary: change cozaar/ hla  Phone Note Refill Request Message from:  Fax from Pharmacy on December 17, 2009 2:24 PM  could [facility] change cozaar to diovan or avapro or benicar? these are all free, cozaar is not.  Initial call taken by: Marin Roberts RN,  December 17, 2009 2:25 PM  Follow-up for Phone Call        Refill approved-nurse to complete Follow-up by: Ulyess Mort MD,  December 17, 2009 4:00 PM  Additional Follow-up for Phone Call Additional follow up Details #1::        done Additional Follow-up by: Julaine Fusi  DO,  December 18, 2009 5:07 PM    Additional Follow-up for Phone Call Additional follow up Details #2::    PLEASE MAKE THE CHANGE TO AVAPRO, THIS IS MOST PLENTIFUL AT THE HEALTH DEPT PHARM, THE EQUAL WOULD BE AVAPRO 150MG  TAKE 1 TABLET DAILY, #90 Follow-up by: Marin Roberts RN,  December 18, 2009 9:39 AM  New/Updated Medications: AVAPRO 150 MG TABS (IRBESARTAN) Take 1 tablet by mouth once a day Prescriptions: AVAPRO 150 MG TABS (IRBESARTAN) Take 1 tablet by mouth once a day  #90 x 0   Entered and Authorized by:   Julaine Fusi  DO   Signed by:   Julaine Fusi  DO on 12/18/2009   Method used:   Faxed to ...       [facility] (retail)       [name]       [place], [place]  [address]       Ph: [phone]       Fax: [phone]   RxID:   [id number]

## 2010-07-15 NOTE — Assessment & Plan Note (Signed)
Summary: EST-2 MONTH RECHECK/CH   Vital Signs:  Patient profile:   50 year old female Height:      64.5 inches (163.83 cm) Weight:      247.8 pounds (112.64 kg) BMI:     42.03 Temp:     98.5 degrees F (36.94 degrees C) oral Pulse rate:   79 / minute BP sitting:   129 / 82  (right arm)  Vitals Entered By: Crystal Kidney Ditzler RN (November 30, 2009 11:23 AM) Is Patient Diabetic? Yes Did you bring your meter with you today? Yes Pain Assessment Patient in pain? yes     Location: right calf Intensity: 8 Type: tingles Onset of pain  long time Nutritional Status BMI of 25 - 29 = overweight Nutritional Status Detail appetite good  Have you ever been in a relationship where you felt threatened, hurt or afraid?denies   Does patient need assistance? Functional Status Self care Ambulation Normal Comments FU still having problems with right calf and foot - tingles. CBG this AM 201.   Primary Care Provider:  Jason Coop MD   History of Present Illness: [name] comes for a f/u visit.   1. DM: She is checking her CBG 2-3 times a day. She started taking lantus 25 units since last visit. Since then her CBG ranged from 135 to 228 and usually it is over 200.   2. HTN: She is taking her meds regularly.   3. HL: She is taking her pravastatin as well.   4. Obesity: Her wt has remained same today compared with the last one, which is 247 lbs. She is walking 15 minutes twice a day, but she walks a lot over the weekend.   Depression History:      The patient denies a depressed mood most of the day and a diminished interest in her usual daily activities.         Preventive Screening-Counseling & Management  Alcohol-Tobacco     Smoking Status: quit     Year Quit: 2000  Caffeine-Diet-Exercise     Does Patient Exercise: yes     Type of exercise: WALKING/ DANCING     Exercise (avg: min/session):         Times/week:     3  Current Medications (verified): 1)  One Daily Womens  Tabs  (Multiple Vitamins-Minerals) 2)  Metformin Hcl 1000 Mg Tabs (Metformin Hcl) .... Take 1 Tablet By Mouth Two Times A Day 3)  Glucotrol 10 Mg Tabs (Glipizide) .... Take One Tablet Once A  Day. 4)  Simvastatin 40 Mg Tabs (Simvastatin) .... Take 1 Pill By Mouth Daily. 5)  Cozaar 50 Mg Tabs (Losartan Potassium) .... Take 1 Pill By Mouth Daily. 6)  Hydrochlorothiazide 25 Mg Tabs (Hydrochlorothiazide) .... Take One Tablet Daily For Blood Pressure. 7)  Vitamin D (Ergocalciferol) 50000 Unit Caps (Ergocalciferol) .... Take One Tablet Weekly For 8 Weeks. 8)  Neurontin 300 Mg Caps (Gabapentin) .... Take 1 Pill By Mouth At Bedtime 9)  Lantus Solostar 100 Unit/ml Soln (Insulin Glargine) .... Inject 25 Units Under The Skin At Bedtime. 10)  Unifine Pentips 31g X 8 Mm Misc (Insulin Pen Needle) .... Use As Directed. 11)  Prilosec 40 Mg Cpdr (Omeprazole) .... Take 1 Pill By Mouth Two Times A Day  Allergies: No Known Drug Allergies  Review of Systems      See HPI  Physical Exam  Lungs:  normal breath sounds, no crackles, and no wheezes.   Heart:  normal  rate and regular rhythm.   Extremities:  trace left pedal edema and trace right pedal edema.     Impression & Recommendations:  Problem # 1:  HYPERTENSION (ICD-401.9) BP improved than last time and at goal.  Her updated medication list for this problem includes:    Cozaar 50 Mg Tabs (Losartan potassium) .Marland Kitchen... Take 1 pill by mouth daily.    Hydrochlorothiazide 25 Mg Tabs (Hydrochlorothiazide) .Marland Kitchen... Take one tablet daily for blood pressure.  BP today: 129/82 Prior BP: 140/94 (11/23/2009)  Labs Reviewed: K+: 3.7 (11/23/2009) Creat: : 0.65 (11/23/2009)   Chol: 184 (11/23/2009)   HDL: 35 (11/23/2009)   LDL: 122 (11/23/2009)   TG: 133 (11/23/2009)  Problem # 2:  HYPERLIPIDEMIA (ICD-272.4) Discussed with the pt and plan is to switch pravastatin 80 mg to zocor 40 mg. If she has problem affording zocor, will switch to pravastatin.  Her updated medication  list for this problem includes:    Simvastatin 40 Mg Tabs (Simvastatin) .Marland Kitchen... Take 1 pill by mouth daily.  Labs Reviewed: SGOT: 35 (11/23/2009)   SGPT: 38 (11/23/2009)   HDL:35 (11/23/2009), 55 (09/30/2009)  LDL:122 (11/23/2009), 179 (09/30/2009)  Chol:184 (11/23/2009), 254 (09/30/2009)  Trig:133 (11/23/2009), 101 (09/30/2009)  Problem # 3:  OBESITY, MORBID (ICD-278.01) Encouraged to gradually increase her exercise level.   Problem # 4:  DM (ICD-250.00) See HPI. Her CBGs has improved but she is still close to 200 on average. Plan is to increase the dose of lantus to 30 units and f/u in a month.  Her updated medication list for this problem includes:    Metformin Hcl 1000 Mg Tabs (Metformin hcl) .Marland Kitchen... Take 1 tablet by mouth two times a day    Glucotrol 10 Mg Tabs (Glipizide) .Marland Kitchen... Take one tablet once a  day.    Cozaar 50 Mg Tabs (Losartan potassium) .Marland Kitchen... Take 1 pill by mouth daily.    Lantus Solostar 100 Unit/ml Soln (Insulin glargine) ..... Inject 30 units under the skin at bedtime.  Labs Reviewed: Creat: 0.65 (11/23/2009)    Reviewed HgBA1c results: 9.2 (09/30/2009)  8.0 (06/25/2009)  Complete Medication List: 1)  One Daily Womens Tabs (Multiple vitamins-minerals) 2)  Metformin Hcl 1000 Mg Tabs (Metformin hcl) .... Take 1 tablet by mouth two times a day 3)  Glucotrol 10 Mg Tabs (Glipizide) .... Take one tablet once a  day. 4)  Simvastatin 40 Mg Tabs (Simvastatin) .... Take 1 pill by mouth daily. 5)  Cozaar 50 Mg Tabs (Losartan potassium) .... Take 1 pill by mouth daily. 6)  Hydrochlorothiazide 25 Mg Tabs (Hydrochlorothiazide) .... Take one tablet daily for blood pressure. 7)  Vitamin D (ergocalciferol) 50000 Unit Caps (Ergocalciferol) .... Take one tablet weekly for 8 weeks. 8)  Neurontin 300 Mg Caps (Gabapentin) .... Take 1 pill by mouth at bedtime 9)  Lantus Solostar 100 Unit/ml Soln (Insulin glargine) .... Inject 30 units under the skin at bedtime. 10)  Unifine Pentips 31g X 8  Mm Misc (Insulin pen needle) .... Use as directed. 11)  Prilosec 40 Mg Cpdr (Omeprazole) .... Take 1 pill by mouth two times a day  Patient Instructions: 1)  Please schedule a follow-up appointment in 1 month. 2)  Limit your Sodium (Salt) to less than 2 grams a day(slightly less than 1/2 a teaspoon) to prevent fluid retention, swelling, or worsening of symptoms. 3)  It is important that you exercise regularly at least 20 minutes 5 times a week. If you develop chest pain, have severe difficulty breathing, or  feel very tired , stop exercising immediately and seek medical attention. 4)  You need to lose weight. Consider a lower calorie diet and regular exercise.  5)  Check your blood sugars regularly. If your readings are usually above : or below 70 you should contact our office. 6)  It is important that your Diabetic A1c level is checked every 3 months. 7)  See your eye doctor yearly to check for diabetic eye damage. 8)  Check your feet each night for sore areas, calluses or signs of infection. 9)  Check your Blood Pressure regularly. If it is above: you should make an appointment. Prescriptions: COZAAR 50 MG TABS (LOSARTAN POTASSIUM) take 1 pill by mouth daily.  #30 x 0   Entered and Authorized by:   Jason Coop MD   Signed by:   Jason Coop MD on 11/30/2009   Method used:   Electronically to        [facility] (retail)       [address]       [place], [place]  [address]       Ph: [phone]       Fax: [phone]   RxID:   [id number] SIMVASTATIN 40 MG TABS (SIMVASTATIN) take 1 pill by mouth daily.  #30 x 0   Entered and Authorized by:   Jason Coop MD   Signed by:   Jason Coop MD on 11/30/2009   Method used:   Electronically to        [facility] (retail)       [address]       [place], [place]  [address]       Ph: [phone]       Fax: [phone]   RxID:    [id number]    Prevention & Chronic Care Immunizations   Influenza vaccine: Not documented   Influenza vaccine deferral: Deferred  (08/31/2009)   Influenza vaccine due: 02/11/2010    Tetanus booster: Not documented   Td booster deferral: Deferred  (08/31/2009)    Pneumococcal vaccine: Not documented   Pneumococcal vaccine deferral: Deferred  (08/31/2009)  Other Screening   Pap smear: Not documented    Mammogram: Not documented   Mammogram due: 06/13/2009   Smoking status: quit  (11/30/2009)  Diabetes Mellitus   HgbA1C: 9.2  (09/30/2009)    Eye exam: Not documented   Diabetic eye exam action/deferral: Ophthalmology referral  (08/31/2009)    Foot exam: yes  (06/11/2009)   Foot exam action/deferral: Do today   High risk foot: Not documented   Foot care education: Not documented    Urine microalbumin/creatinine ratio: 6.6  (09/30/2009)  Lipids   Total Cholesterol: 184  (11/23/2009)   LDL: 122  (11/23/2009)   LDL Direct: Not documented   HDL: 35  (11/23/2009)   Triglycerides: 133  (11/23/2009)    SGOT (AST): 35  (11/23/2009)   SGPT (ALT): 38  (11/23/2009)   Alkaline phosphatase: 64  (11/23/2009)   Total bilirubin: 0.3  (11/23/2009)  Hypertension   Last Blood Pressure: 129 / 82  (11/30/2009)   Serum creatinine: 0.65  (11/23/2009)   Serum potassium 3.7  (11/23/2009)  Self-Management Support :   Personal Goals (by the next clinic visit) :     Personal A1C goal: 7  (04/03/2009)     Personal blood pressure goal: 140/90  (04/03/2009)  Personal LDL goal: 70  (04/03/2009)    Patient will work on the following items until the next clinic visit to reach self-care goals:     Medications and monitoring: take my medicines every day, check my blood sugar, check my blood pressure, bring all of my medications to every visit, weigh myself weekly, examine my feet every day  (11/30/2009)     Eating: drink diet soda or water instead of juice or soda, eat more vegetables,  use fresh or frozen vegetables, eat foods that are low in salt, eat baked foods instead of fried foods, eat fruit for snacks and desserts, limit or avoid alcohol  (11/30/2009)     Activity: take a 30 minute walk every day  (11/30/2009)    Diabetes self-management support: Copy of home glucose meter record, Written self-care plan, Education handout, Resources for patients handout  (11/30/2009)   Diabetes care plan printed   Diabetes education handout printed   Last diabetes self-management training by diabetes educator: 10/19/2009    Hypertension self-management support: Written self-care plan, Education handout, Resources for patients handout  (11/30/2009)   Hypertension self-care plan printed.   Hypertension education handout printed    Lipid self-management support: Written self-care plan, Education handout, Resources for patients handout  (11/30/2009)   Lipid self-care plan printed.   Lipid education handout printed      Resource handout printed.

## 2010-07-15 NOTE — Assessment & Plan Note (Signed)
Summary: ED f/u, Right leg/foot weakness,pain, chest pain/pcp-bowers/hla   Vital Signs:  Patient profile:   50 year old female Height:      64.5 inches Weight:      245.6 pounds BMI:     41.66 Temp:     98.7 degrees F oral Pulse rate:   88 / minute BP sitting:   125 / 80  (left arm)  Vitals Entered By: Filomena Jungling NT II (December 17, 2009 10:35 AM) CC: ER FOLLOW-UP Is Patient Diabetic? Yes Did you bring your meter with you today? Yes Pain Assessment Patient in pain? yes     Location: RIGHT LEG AND LEFT HIP Intensity: 10 Type: aching Onset of pain  4 DAYS Nutritional Status BMI of > 30 = obese CBG Result 293  Have you ever been in a relationship where you felt threatened, hurt or afraid?No   Does patient need assistance? Functional Status Self care Ambulation Normal   Primary Care Provider:  Jason Coop MD  CC:  ER FOLLOW-UP.  History of Present Illness: Pt is a 50 y/o woman with P/H of DM, HTN, Right sided Lumbar disc herniation. She came to the office for f/u after her ER visit yesterday for her rt upper back, rt upper chest and rt buttock pain.  1) Buttock pain- Pt says that she has severe aching pain in her rt buttock, 10/10, radiating to the back of rt thigh and upto the rt calf and toes. Nothing makes it better, any movements of rt extremity makes it worse. It also disturbs her sleep.  She had pain in her lower lumbar spine. The report from the MRI L/S done on 01/19/02 shows the following.   THE PATIENT HAS TWELVE RIB-BEARING VERTEBRAE AND ONLY FOUR   NORMAL LUMBAR SEGMENTS OF THE LUMBAR SPINE.  THE L5   VERTEBRAL BODY IS FUSED TO THE SACRUM AND HAS A VESTIGIAL   DISK AT L5-S1. Her recent MRI L/S on 08/13/09 shows rt sided lumbar disc herniation at L5- S1 apparently compressing the rt S1  root. She does not have any tingling , numbness or weakness in rt extremity. She also doesn't have any pain in the lumbar region as of now.  2)Upper right Back and Chest  pain- She had this yesterday when she went to the ER. X-ray Chest and x-ray Scapula Right side, were done there which showed no acute abnormalities. So she was given Percocet 5mg -325mg  1-2 tab every 6 hrs and was sent home to get f/u at office today.  Her back and chest pain seems to be improved compared to yesterday and she is not complaining of them any more.  Pt does not have any h/o fracture bone or other trauma. Pt denies any fever, abd pain, nausea , vomitting, urine abnormalities, alteration in bowel habits, change in vision, dizziness.     Preventive Screening-Counseling & Management  Alcohol-Tobacco     Smoking Status: quit     Year Quit: 2000  Caffeine-Diet-Exercise     Does Patient Exercise: yes     Type of exercise: WALKING/ DANCING     Exercise (avg: min/session):         Times/week:     3  Current Medications (verified): 1)  One Daily Womens  Tabs (Multiple Vitamins-Minerals) 2)  Metformin Hcl 1000 Mg Tabs (Metformin Hcl) .... Take 1 Tablet By Mouth Two Times A Day 3)  Glucotrol 10 Mg Tabs (Glipizide) .... Take One Tablet Once A  Day. 4)  Simvastatin 40 Mg Tabs (Simvastatin) .... Take 1 Pill By Mouth Daily. 5)  Cozaar 50 Mg Tabs (Losartan Potassium) .... Take 1 Pill By Mouth Daily. 6)  Hydrochlorothiazide 25 Mg Tabs (Hydrochlorothiazide) .... Take One Tablet Daily For Blood Pressure. 7)  Vitamin D (Ergocalciferol) 50000 Unit Caps (Ergocalciferol) .... Take One Tablet Weekly For 8 Weeks. 8)  Neurontin 300 Mg Caps (Gabapentin) .... Take 1 Pill By Mouth At Bedtime 9)  Lantus Solostar 100 Unit/ml Soln (Insulin Glargine) .... Inject 30 Units Under The Skin At Bedtime. 10)  Unifine Pentips 31g X 8 Mm Misc (Insulin Pen Needle) .... Use As Directed. 11)  Prilosec 40 Mg Cpdr (Omeprazole) .... Take 1 Pill By Mouth Two Times A Day  Allergies: No Known Drug Allergies  Review of Systems       as per HPI  Physical Exam  General:  alert, well-developed, and  well-nourished.   Head:  normocephalic and atraumatic.   Lungs:  normal breath sounds, no crackles, and no wheezes.   Heart:  normal rate and regular rhythm.   Additional Exam:  Lower extremities exam- Gait- less wait bearing on rt side due to pain. Inspection- no visible abnormalities, except an old scar on rt mid shin. ROM- Rt hip- no pain in flexion, extension, internal and external rotation except limited ROM due to pain.          Lt hip- ROM intact         Both knees ROM intact.  Sensory exam- WNL No weakness in rt lower extremity.        Impression & Recommendations:  Problem # 1:  HERNIATED LUMBAR DISC (ICD-722.10) Assessment Comment Only Pt has a long standng lower lumbar disc herniation. She got severe attack of rt extremity pain yesterday, 10/10, radiating to the back of rt thigh and up to the rt calf and toes.  She seems to be having acute lower back pain due to her disc herniation, but as per her complaint of having a different kind of pain than in past, want to do an x-ray of rt hip to make sure its not the cause of her pain at present, instead of her disc.  Advised patient to take rest as needed, but not total bed rest. Maintain some ambulation as much as she could.  Prescribed Ibuprofen 400mg  tab 4 times a day for 2 weeks. Also increased her dose of Neurontin from 300mg  at bed time to three times a day.  Will f/u the pt in 2 weeks and will reasses her condition.  Orders: Hips 2 views bilateral ([id number]) Diagnostic X-Ray/Fluoroscopy (Diagnostic X-Ray/Flu)  Problem # 2:  DM (ICD-250.00) Assessment: Comment Only Her HA1c today was 8.9 and her average CBG from 11/18/09 to 12/18/09 was 207.8. Her diabetes seems to be poorly controlled. Will address this issue during the next visit. Her updated medication list for this problem includes:    Metformin Hcl 1000 Mg Tabs (Metformin hcl) .Marland Kitchen... Take 1 tablet by mouth two times a day    Glucotrol 10 Mg Tabs (Glipizide) .Marland Kitchen...  Take one tablet once a  day.    Cozaar 50 Mg Tabs (Losartan potassium) .Marland Kitchen... Take 1 pill by mouth daily.    Lantus Solostar 100 Unit/ml Soln (Insulin glargine) ..... Inject 30 units under the skin at bedtime.  Orders: T- Capillary Blood Glucose ([id number]) T-Hgb A1C (in-house) ([id number])  Labs Reviewed: Creat: 0.65 (11/23/2009)    Reviewed HgBA1c results: 8.9 (12/17/2009)  9.2 (09/30/2009)  Complete Medication  List: 1)  One Daily Womens Tabs (Multiple vitamins-minerals) 2)  Metformin Hcl 1000 Mg Tabs (Metformin hcl) .... Take 1 tablet by mouth two times a day 3)  Glucotrol 10 Mg Tabs (Glipizide) .... Take one tablet once a  day. 4)  Simvastatin 40 Mg Tabs (Simvastatin) .... Take 1 pill by mouth daily. 5)  Cozaar 50 Mg Tabs (Losartan potassium) .... Take 1 pill by mouth daily. 6)  Hydrochlorothiazide 25 Mg Tabs (Hydrochlorothiazide) .... Take one tablet daily for blood pressure. 7)  Vitamin D (ergocalciferol) 50000 Unit Caps (Ergocalciferol) .... Take one tablet weekly for 8 weeks. 8)  Neurontin 300 Mg Caps (Gabapentin) .... Take 1 tablet by mouth three times a day 9)  Lantus Solostar 100 Unit/ml Soln (Insulin glargine) .... Inject 30 units under the skin at bedtime. 10)  Unifine Pentips 31g X 8 Mm Misc (Insulin pen needle) .... Use as directed. 11)  Prilosec 40 Mg Cpdr (Omeprazole) .... Take 1 pill by mouth two times a day 12)  Ibuprofen 400 Mg Tabs (Ibuprofen) .... Take 1 tab 4 times a day by mouth.    Patient Instructions: 1)  Please schedule a follow-up appointment in 2 weeks with Dr. Allena Katz 2)  1) Your Neurontin dose is increased from once a day at bedtime to three times a day, as once a day seems to be not effective for you. 3)  Will f/u for this in 2 weeks. 4)  2) For your pain in rt hip and leg, we will do x-rays.  5)  Also you need to take rest for about next 2 weeks. It must not be absolute bed rest, you need to rest as much as you are comfortable with. But you can surely move  around as much as you could. 6)  But you need to - avoid strenous activity 7)                            - avoid carrying heavy load 8)                            - avoid bending forward 9)                            - avoid sleeping with your head down in bed 10)                        - If you have pain while in bed, try to sleep with your right knee bent.Marland Kitchen it might help, or any other position which helps you. 11)  You need to take pain medication for your pain which will help you. 12)  Ibuprofen 400 mg tablets 4 times a dayby mouth for next 2 weeks. Prescriptions: NEURONTIN 300 MG CAPS (GABAPENTIN) Take 1 tablet by mouth three times a day  #30 x 3   Entered and Authorized by:   Lyn Hollingshead   Signed by:   Lyn Hollingshead on 12/17/2009   Method used:   Electronically to        [facility] (retail)       [address]       [place], [place]  [address]       Ph: [phone]       Fax: [phone]  RxID:   [id number] IBUPROFEN 400 MG TABS (IBUPROFEN) take 1 tab 4 times a day by mouth.  #60 x 0   Entered and Authorized by:   Lyn Hollingshead   Signed by:   Lyn Hollingshead on 12/17/2009   Method used:   Electronically to        [facility] (retail)       [address]       [place], [place]  [address]       Ph: [phone]       Fax: [phone]   RxID:   [id number]   Prevention & Chronic Care Immunizations   Influenza vaccine: Not documented   Influenza vaccine deferral: Deferred  (08/31/2009)   Influenza vaccine due: 02/11/2010    Tetanus booster: Not documented   Td booster deferral: Deferred  (08/31/2009)    Pneumococcal vaccine: Not documented   Pneumococcal vaccine deferral: Deferred  (08/31/2009)  Other Screening   Pap smear: Not documented    Mammogram: Not documented   Mammogram due: 06/13/2009   Smoking status: quit  (12/17/2009)  Diabetes Mellitus   HgbA1C: 8.9  (12/17/2009)    Eye exam: Not  documented   Diabetic eye exam action/deferral: Ophthalmology referral  (08/31/2009)    Foot exam: yes  (06/11/2009)   Foot exam action/deferral: Do today   High risk foot: Not documented   Foot care education: Not documented    Urine microalbumin/creatinine ratio: 6.6  (09/30/2009)    Diabetes flowsheet reviewed?: Yes   Progress toward A1C goal: Unchanged   Diabetes comments: Pts DM seems to be poorly controlled. Her HgA1c is 8.9 and was 9.2 at last visit. It seems to be almost similar to the previous reading. Will manage this issue at next visit.  Lipids   Total Cholesterol: 184  (11/23/2009)   LDL: 122  (11/23/2009)   LDL Direct: Not documented   HDL: 35  (11/23/2009)   Triglycerides: 133  (11/23/2009)    SGOT (AST): 35  (11/23/2009)   SGPT (ALT): 38  (11/23/2009)   Alkaline phosphatase: 64  (11/23/2009)   Total bilirubin: 0.3  (11/23/2009)    Lipid flowsheet reviewed?: Yes   Progress toward LDL goal: Improved   Lipid comments: Her total and LdL Cholesterol seems to be improving. But HDL seems to be deteriorating.  Hypertension   Last Blood Pressure: 125 / 80  (12/17/2009)   Serum creatinine: 0.65  (11/23/2009)   Serum potassium 3.7  (11/23/2009)    Hypertension flowsheet reviewed?: Yes   Progress toward BP goal: At goal  Self-Management Support :   Personal Goals (by the next clinic visit) :     Personal A1C goal: 7  (04/03/2009)     Personal blood pressure goal: 140/90  (04/03/2009)     Personal LDL goal: 70  (04/03/2009)    Patient will work on the following items until the next clinic visit to reach self-care goals:     Medications and monitoring: take my medicines every day, check my blood sugar, check my blood pressure, bring all of my medications to every visit  (12/17/2009)     Eating: drink diet soda or water instead of juice or soda, eat more vegetables, use fresh or frozen vegetables, eat foods that are low in salt, eat baked foods instead of fried foods,  eat fruit for snacks and desserts, limit or avoid alcohol  (12/17/2009)     Activity: take a  30 minute walk every day  (12/17/2009)    Diabetes self-management support: Education handout  (12/17/2009)   Diabetes education handout printed   Last diabetes self-management training by diabetes educator: 10/19/2009    Hypertension self-management support: Education handout  (12/17/2009)   Hypertension education handout printed    Lipid self-management support: Education handout  (12/17/2009)     Lipid education handout printed    Laboratory Results   Blood Tests   Date/Time Received: December 17, 2009 11:07 AM  Date/Time Reported: Burke Keels  December 17, 2009 11:07 AM   HGBA1C: 8.9%   (Normal Range: Non-Diabetic - 3-6%   Control Diabetic - 6-8%) CBG Random:: 293mg /dL

## 2010-07-15 NOTE — Progress Notes (Signed)
Summary: refill/gg  Phone Note Call from Patient  on January 29, 2010 3:18 PM  Refills Requested: Medication #1:  PRILOSEC 40 MG CPDR take 1 pill by mouth two times a day refill is for nexium 40 mg daily.  will you change?   Method Requested: Fax to Local Pharmacy Initial call taken by: Merrie Roof RN,  January 29, 2010 3:18 PM  Follow-up for Phone Call        Review of the record does not answer why the refill is reportedly for nexium.  It looks like Dr. Odis Luster precribed prilosec on 01/01/10 with 6 refills so this is what was requested.  Review of a recent clinic note (01/20/2010) does not make note of any change to nexium.  Please return call to inform patient it is prilosec which she has refills remaining.  Thank You. Follow-up by: Doneen Poisson MD,  January 29, 2010 3:26 PM  Additional Follow-up for Phone Call Additional follow up Details #1::        Usually the [facility] can get nexium at no change for the pt.  They often ask for this reason.  I have called pharmacy and they will return call to verify the reason. Additional Follow-up by: Merrie Roof RN,  February 01, 2010 9:36 AM    New/Updated Medications: NEXIUM 40 MG CPDR (ESOMEPRAZOLE MAGNESIUM) Take one tablet by mouth once a day Prescriptions: NEXIUM 40 MG CPDR (ESOMEPRAZOLE MAGNESIUM) Take one tablet by mouth once a day  #30 x 11   Entered and Authorized by:   Doneen Poisson MD   Signed by:   Doneen Poisson MD on 02/04/2010   Method used:   Faxed to ...       [facility] (retail)       [address]       [place], [place]  [address]       Ph: [phone]       Fax: [phone]   RxID:   [id number]

## 2010-07-15 NOTE — Assessment & Plan Note (Signed)
Summary: RECK BP/LABS/EST/VS   Vital Signs:  Patient profile:   50 year old female Height:      64.5 inches (163.83 cm) Weight:      237.4 pounds (107.91 kg) BMI:     40.27 Temp:     98.3 degrees F (36.83 degrees C) oral Pulse rate:   102 / minute BP sitting:   134 / 97  (right arm)  Vitals Entered By: Stanton Kidney Ditzler RN (July 23, 2009 9:40 AM) Is Patient Diabetic? Yes Did you bring your meter with you today? Yes Pain Assessment Patient in pain? yes     Location: throat and both legs Intensity: 8 Type: stabbing Onset of pain  since 06/2009 Nutritional Status BMI of > 30 = obese Nutritional Status Detail appetite ok CBG Result 266  Have you ever been in a relationship where you felt threatened, hurt or afraid?denies   Does patient need assistance? Functional Status Self care Ambulation Normal Comments Tonsil removed 06/2009 Dr Ezzard Standing. Feet cold since surgery. Stabbing pain in both legs since 06/2009.   Primary Care Provider:  Jason Coop MD   History of Present Illness: Mr. Rosch is a 50 yo lady with PMH as outlined in the EMR comes today for a f/u visit.   1. HTN: She is taking lisinopril 5 mg and HCTZ 25 mg daily. She has dry cough. She states she started to have cough after she took lisinopril. Sometimes the cough wakes up her from sleep. She has some throat irritation. This has especially been worse after surgery.   2. DM: She checks her CBG 1-3 times a day. She ranged from 63-249. She had shakiness, and thought was about to pass out when her CBG was 63. She can't recall why that day she ran 36 even though she usu runs in 200 most of the time. She recently finished prednisone for 3 days, which she was taking for back pain. She doesn't know the dose of her prednisone, but she was taking it for 12 days. She saw her eye MD past December.   3. S/p tonsillectomy: Pt saw Dr. Thalia Bloodgood after surgery and she had a smooth clinical course post surgery. Recently she visited ED  for sorethroat and she completed a course of azithromycin. She was also given some fluids as she was told she was dehydrated. Now she feels better, but she still hurts little bit when she swallows or yawn. Dr. Thalia Bloodgood told the pt that this problem can persist for a while.   4. HL: She takes her pravastatin regularly without any abdominal pain or muscle pain.   5. Transaminitis: No abdl pain.   6. Epigastric Pain: No abdl pain.   7. Back Pain: It is stable.   8. Obesity: She lost 4 lbs. SHe is not able to eat a lot after tonsillectomy. She is not actively exercising now.   Depression History:      The patient denies a depressed mood most of the day and a diminished interest in her usual daily activities.         Preventive Screening-Counseling & Management  Alcohol-Tobacco     Smoking Status: quit     Year Quit: 2000  Caffeine-Diet-Exercise     Does Patient Exercise: yes     Type of exercise: WALKING/ DANCING     Exercise (avg: min/session):         Times/week:     3  Current Medications (verified): 1)  One Daily Womens  Tabs (  Multiple Vitamins-Minerals) 2)  Metformin Hcl 1000 Mg Tabs (Metformin Hcl) .... Take 1 Tablet By Mouth Two Times A Day 3)  Nexium 40 Mg Cpdr (Esomeprazole Magnesium) .... Take 1 Tablet By Mouth Once A Day 4)  Glucotrol 10 Mg Tabs (Glipizide) .... Take One Tablet Two Times A Day. 5)  Pravachol 20 Mg Tabs (Pravastatin Sodium) .... Take 1 Pill By Mouth Daily. 6)  Ultram 50 Mg Tabs (Tramadol Hcl) .... Take 1 Tablet By Mouth Every 8 Hours As Needed For Pain 7)  Lisinopril 5 Mg Tabs (Lisinopril) .... Take One Tablet A Day For Kidney Protection. 8)  Hydrochlorothiazide 25 Mg Tabs (Hydrochlorothiazide) .... Take One Tablet Daily For Blood Pressure. 9)  Vitamin D (Ergocalciferol) 50000 Unit Caps (Ergocalciferol) .... Take One Tablet Weekly For 8 Weeks. 10)  Neurontin 300 Mg Caps (Gabapentin) .... Take 1 Pill By Mouth At Bedtime  Allergies: No Known Drug  Allergies  Review of Systems      See HPI  Physical Exam  General:  alert.   Mouth:  pharynx pink and moist, no erythema, and no exudates.   Lungs:  normal breath sounds, no crackles, and no wheezes.   Heart:  normal rate, regular rhythm, no murmur, and no gallop.   Abdomen:  soft and non-tender.   Extremities:  trace left pedal edema and trace right pedal edema.   Neurologic:  alert & oriented X3.   Cervical Nodes:  no anterior cervical adenopathy and no posterior cervical adenopathy.     Impression & Recommendations:  Problem # 1:  HYPERTENSION (ICD-401.9) BP improved with HCTZ. Pt still has dry cough along with some throat irritation and there is concern for ACE induced cough. However, she had a recent tonsillectomy and she states this cough got worse after surgery. Plan for now is to continue the ACE and if her cough is really bothersome and persistent may need to d/c ACE and start ARB as she is diabetic. If we need additional medicine, norvasc is a good option.  Her updated medication list for this problem includes:    Lisinopril 5 Mg Tabs (Lisinopril) .Marland Kitchen... Take one tablet a day for kidney protection.    Hydrochlorothiazide 25 Mg Tabs (Hydrochlorothiazide) .Marland Kitchen... Take one tablet daily for blood pressure.  BP today: 134/97 Prior BP: 177/90 (07/07/2009)  Labs Reviewed: K+: 4.1 (06/12/2009) Creat: : 0.64 (06/12/2009)   Chol: 187 (06/12/2009)   HDL: 33 (06/12/2009)   LDL: 131 (06/12/2009)   TG: 114 (06/12/2009)  Problem # 2:  TRANSAMINASES, SERUM, ELEVATED (ICD-790.4) Will check CMET today.   Problem # 3:  OBESITY, MORBID (ICD-278.01) Encouraged to become more active and check her diet.   Problem # 4:  HYPERLIPIDEMIA (ICD-272.4) Will check FLP and CMET today.  Her updated medication list for this problem includes:    Pravachol 20 Mg Tabs (Pravastatin sodium) .Marland Kitchen... Take 1 pill by mouth daily.  Orders: T-Comprehensive Metabolic Panel [id number]) T-Lipid Profile  [phone])  Labs Reviewed: SGOT: 99 (06/12/2009)   SGPT: 76 (06/12/2009)   HDL:33 (06/12/2009), 34 (04/03/2009)  LDL:131 (06/12/2009), 174 (04/03/2009)  Chol:187 (06/12/2009), 239 (04/03/2009)  Trig:114 (06/12/2009), 154 (04/03/2009)  Problem # 5:  DM (ICD-250.00) The pt usu runs in 200. SHe had 1 episode of symptomatic hypoglycemia and was on a course of prednisone which she recently finished. Plan for today is to continue her same regimen and f/u in 2 wks her home CBG reading.  Her updated medication list for this problem includes:  Metformin Hcl 1000 Mg Tabs (Metformin hcl) .Marland Kitchen... Take 1 tablet by mouth two times a day    Glucotrol 10 Mg Tabs (Glipizide) .Marland Kitchen... Take one tablet two times a day.    Lisinopril 5 Mg Tabs (Lisinopril) .Marland Kitchen... Take one tablet a day for kidney protection.  Labs Reviewed: Creat: 0.64 (06/12/2009)    Reviewed HgBA1c results: 8.0 (06/25/2009)  7.5 (04/03/2009)  Complete Medication List: 1)  One Daily Womens Tabs (Multiple vitamins-minerals) 2)  Metformin Hcl 1000 Mg Tabs (Metformin hcl) .... Take 1 tablet by mouth two times a day 3)  Nexium 40 Mg Cpdr (Esomeprazole magnesium) .... Take 1 tablet by mouth once a day 4)  Glucotrol 10 Mg Tabs (Glipizide) .... Take one tablet two times a day. 5)  Pravachol 20 Mg Tabs (Pravastatin sodium) .... Take 1 pill by mouth daily. 6)  Ultram 50 Mg Tabs (Tramadol hcl) .... Take 1 tablet by mouth every 8 hours as needed for pain 7)  Lisinopril 5 Mg Tabs (Lisinopril) .... Take one tablet a day for kidney protection. 8)  Hydrochlorothiazide 25 Mg Tabs (Hydrochlorothiazide) .... Take one tablet daily for blood pressure. 9)  Vitamin D (ergocalciferol) 50000 Unit Caps (Ergocalciferol) .... Take one tablet weekly for 8 weeks. 10)  Neurontin 300 Mg Caps (Gabapentin) .... Take 1 pill by mouth at bedtime  Other Orders: Capillary Blood Glucose/CBG ([id number])  Patient Instructions: 1)  Please schedule a follow-up appointment in 2  weeks. 2)  Limit your Sodium (Salt) to less than 2 grams a day(slightly less than 1/2 a teaspoon) to prevent fluid retention, swelling, or worsening of symptoms. 3)  It is important that you exercise regularly at least 20 minutes 5 times a week. If you develop chest pain, have severe difficulty breathing, or feel very tired , stop exercising immediately and seek medical attention. 4)  You need to lose weight. Consider a lower calorie diet and regular exercise.  5)  Check your blood sugars regularly. If your readings are usually above : or below 70 you should contact our office. 6)  It is important that your Diabetic A1c level is checked every 3 months. 7)  See your eye doctor yearly to check for diabetic eye damage. 8)  Check your feet each night for sore areas, calluses or signs of infection. 9)  Check your Blood Pressure regularly. If it is above: you should make an appointment. Prescriptions: NEURONTIN 300 MG CAPS (GABAPENTIN) take 1 pill by mouth at bedtime  #30 x 2   Entered and Authorized by:   Jason Coop MD   Signed by:   Jason Coop MD on 07/23/2009   Method used:   Electronically to        [facility] [phone]* (retail)       [address]       [place], [place]  [id number]       Ph: [phone] or [phone]       Fax: [phone]   RxID:   [id number] METFORMIN HCL 1000 MG TABS (METFORMIN HCL) Take 1 tablet by mouth two times a day  #60 x 4   Entered and Authorized by:   Jason Coop MD   Signed by:   Jason Coop MD on 07/23/2009   Method used:   [facility]ically to        [facility]  [address] [phone]* (retail)       [address]  [place], [place]  [address]       Ph: [phone] or [phone]       Fax: [phone]   RxID:   [id number]  Process Orders Check Orders Results:     [facility]: ABN not required for this insurance Tests Sent for  requisitioning (July 24, 2009 12:28 PM):     07/23/2009: [facility] -- T-Comprehensive Metabolic Panel [[id number]] (signed)     07/23/2009: [facility] -- T-Lipid Profile [phone] (signed)    Prevention & Chronic Care Immunizations   Influenza vaccine: Not documented   Influenza vaccine due: 02/11/2010    Tetanus booster: Not documented    Pneumococcal vaccine: Not documented  Other Screening   Pap smear: Not documented    Mammogram: Not documented   Mammogram due: 06/13/2009   Smoking status: quit  (07/23/2009)  Diabetes Mellitus   HgbA1C: 8.0  (06/25/2009)    Eye exam: Not documented   Diabetic eye exam action/deferral: Ophthalmology referral  (07/07/2009)    Foot exam: yes  (06/11/2009)   Foot exam action/deferral: Do today   High risk foot: Not documented   Foot care education: Not documented    Urine microalbumin/creatinine ratio: 4.2  (04/03/2009)    Diabetes flowsheet reviewed?: Yes   Progress toward A1C goal: Unchanged  Lipids   Total Cholesterol: 187  (06/12/2009)   LDL: 131  (06/12/2009)   LDL Direct: Not documented   HDL: 33  (06/12/2009)   Triglycerides: 114  (06/12/2009)    SGOT (AST): 99  (06/12/2009)   SGPT (ALT): 76  (06/12/2009) CMP ordered    Alkaline phosphatase: 65  (06/12/2009)   Total bilirubin: 0.3  (06/12/2009)    Lipid flowsheet reviewed?: Yes   Progress toward LDL goal: Unchanged  Hypertension   Last Blood Pressure: 134 / 97  (07/23/2009)   Serum creatinine: 0.64  (06/12/2009)   Serum potassium 4.1  (06/12/2009) CMP ordered     Hypertension flowsheet reviewed?: Yes   Progress toward BP goal: Unchanged  Self-Management Support :   Personal Goals (by the next clinic visit) :     Personal A1C goal: 7  (04/03/2009)     Personal blood pressure goal: 140/90  (04/03/2009)     Personal LDL goal: 70  (04/03/2009)    Patient will work on the following items until the next clinic visit to  reach self-care goals:     Medications and monitoring: take my medicines every day, check my blood sugar, check my blood pressure, bring all of my medications to every visit, weigh myself weekly, examine my feet every day  (07/23/2009)     Eating: drink diet soda or water instead of juice or soda, eat more vegetables, use fresh or frozen vegetables, eat foods that are low in salt, eat baked foods instead of fried foods, eat fruit for snacks and desserts, limit or avoid alcohol  (07/23/2009)     Activity: take a 30 minute walk every day  (07/23/2009)    Diabetes self-management support: Copy of home glucose meter record, Written self-care plan  (07/23/2009)   Diabetes care plan printed   Last diabetes self-management training by diabetes educator: 01/08/2009    Hypertension self-management support: Written self-care plan  (07/23/2009)   Hypertension self-care plan printed.    Lipid self-management support: Written self-care plan  (07/23/2009)   Lipid self-care plan printed.  Process Orders Check Orders Results:     [facility]: ABN  not required for this insurance Tests Sent for requisitioning (July 24, 2009 12:28 PM):     07/23/2009: [facility] -- T-Comprehensive Metabolic Panel [[id number]] (signed)     07/23/2009: [facility] -- T-Lipid Profile [phone] (signed)

## 2010-07-15 NOTE — Procedures (Signed)
Summary: [facility] Operative Procedures  [facility] Operative Procedures   Imported By: Florinda Marker 06/15/2009 14:57:20  _____________________________________________________________________  External Attachment:    Type:   Image     Comment:   External Document

## 2010-07-15 NOTE — Progress Notes (Signed)
Summary: losartan cost/ hla  Phone Note Outgoing Call   Summary of Call: pt will get losartan at [facility] for 25.00 per frank [facility] Initial call taken by: Marin Roberts RN,  August 31, 2009 4:16 PM

## 2010-07-15 NOTE — Progress Notes (Signed)
Summary: change med/ hla  Phone Note From Pharmacy   Summary of Call: please change valacyclovir to acyclovir 400mg  so pt can get from [facility]  thanks Initial call taken by: Marin Roberts RN,  February 04, 2010 12:16 PM    New/Updated Medications: ACYCLOVIR 400 MG TABS (ACYCLOVIR) Take 1 tablet by mouth three times a day for five days Prescriptions: ACYCLOVIR 400 MG TABS (ACYCLOVIR) Take 1 tablet by mouth three times a day for five days  #15 x 0   Entered and Authorized by:   Whitney Post MD   Signed by:   Whitney Post MD on 02/04/2010   Method used:   Electronically to        [facility] (retail)       [address]       [place], [place]  [address]       Ph: [phone]       Fax: [phone]   RxID:   [id number]

## 2010-07-15 NOTE — Assessment & Plan Note (Signed)
Summary: RA/NEEDS ROUTINE CHECKUP/CH   Vital Signs:  Patient profile:   50 year old female Height:      64.5 inches Weight:      243.1 pounds BMI:     41.23 Temp:     98.7 degrees F oral Pulse rate:   84 / minute BP sitting:   114 / 74  (left arm)  Vitals Entered By: Filomena Jungling NT II (January 01, 2010 1:37 PM) CC: right hip pain x 1week Is Patient Diabetic? Yes Did you bring your meter with you today? Yes Pain Assessment Patient in pain? yes     Location: right hip Intensity: 10 Type: aching Onset of pain  1 month Nutritional Status BMI of > 30 = obese  Does patient need assistance? Functional Status Self care Ambulation Normal   Primary Care Provider:  Whitney Post MD  CC:  right hip pain x 1week.  History of Present Illness: [name] presents today for follow-up of back pain. She has a long history of lower back pain with well-documented disc disease. MR of spine in 08/2009 showed large central right-sided disc herniation at L5-S1 compressing the right S1 nerve root.  Unfortunately, her back pain has increased markedly over the past few weeks, with increasing pain and weakness in her right lower extremity. She was last seen on 12/18/1999 with increasing hip pain and lower extremity weakness. X-ray of hips at that showed only mild DJD, suggesting that this pain is secondary to her previously identified lumbar spine disease. She called the clinic last week because of increasing pain that was not adequately controlled with Neurontin. She has taken Percocet for this pain, but she does not like it because it makes her sleepy. Dr. Phillips Odor spoke to her on the phone last Friday, 7/15, and prescribed an oral steoid taper pack to reduce inflammation. [name] has completed the steroid pack at this time, which she says provided some relief. However, she is still extremely distressed by the increased back and leg pain, which have made it difficult to her to walk even short distances and  function in her daily life.    Preventive Screening-Counseling & Management  Alcohol-Tobacco     Smoking Status: quit     Year Quit: 2000  Caffeine-Diet-Exercise     Does Patient Exercise: no     Type of exercise: WALKING/ DANCING     Exercise (avg: min/session):         Times/week:     3  Current Medications (verified): 1)  One Daily Womens  Tabs (Multiple Vitamins-Minerals) 2)  Metformin Hcl 1000 Mg Tabs (Metformin Hcl) .... Take 1 Tablet By Mouth Two Times A Day 3)  Glucotrol 10 Mg Tabs (Glipizide) .... Take One Tablet Once A  Day. 4)  Simvastatin 40 Mg Tabs (Simvastatin) .... Take 1 Pill By Mouth Daily. 5)  Avapro 150 Mg Tabs (Irbesartan) .... Take 1 Tablet By Mouth Once A Day 6)  Hydrochlorothiazide 25 Mg Tabs (Hydrochlorothiazide) .... Take One Tablet Daily For Blood Pressure. 7)  Vitamin D (Ergocalciferol) 50000 Unit Caps (Ergocalciferol) .... Take One Tablet Weekly For 8 Weeks. 8)  Lantus Solostar 100 Unit/ml Soln (Insulin Glargine) .... Inject 35 Units Under The Skin At Bedtime. 9)  Unifine Pentips 31g X 8 Mm Misc (Insulin Pen Needle) .... Use As Directed. 10)  Prilosec 40 Mg Cpdr (Omeprazole) .... Take 1 Pill By Mouth Two Times A Day 11)  Prednisone (Pak) 10 Mg Tabs (Prednisone) .... 6 Day  Dose Pak 60mg  Start. Take As Directed. 12)  Gabapentin 800 Mg Tabs (Gabapentin) .... Take 1 Tablet By Mouth Three Times A Day 13)  Percocet 5-325 Mg Tabs (Oxycodone-Acetaminophen) .... Take 1 Tablet Every 6 Hours As Needed For Pain 14)  Celebrex 200 Mg Caps (Celecoxib) .... Take One Tablet Twice Per Day For Acute Pain  Allergies (verified): No Known Drug Allergies  Social History: Recently lost job. Lives with husband and daughter. Recently lost health insurance when daughter turned 16. Does Patient Exercise:  no  Review of Systems      See HPI General:  Complains of weakness. MS:  Complains of low back pain and muscle weakness. Neuro:  Complains of tingling and weakness;  denies falling down and numbness.  Physical Exam  General:  alert, cooperative to examination, and overweight-appearing. Tearful when discussing ongoing pain.   Head:  no abnormalities observed.   Eyes:  vision grossly intact, pupils equal, pupils round, and pupils reactive to light.   Ears:  R ear normal and L ear normal.   Mouth:  pharynx pink and moist and fair dentition.   Neck:  no masses and no neck tenderness.   Lungs:  normal breath sounds.   Heart:  normal rate, regular rhythm, no murmur, no gallop, and no rub.   Abdomen:  soft and non-tender.   Msk:  Tenderness to palpation of lumbar spine.  Neurologic:  5/5 strength in lower extemities Psych:  Oriented X3.     Detailed Back/Spine Exam  General:    obese.    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 It seems probable that she will require surgery. We are in the process of referring her to a neurosurgeon.   In the meantime, to achieve better pain management, I am increasing her Neurontin to 800mg  three times a day. I am also giving her samples of Celebrex 200mg  -- she is instructed to take two tablets per day to treat her acute pain. She has also been instructed to stop taking ibuprofen. I will also give her #90 Percocet to help manage her pain, which she agrees is necessary, although she does not like how sleepy it makes her. When she returns for follow-up, we will revisit her progress getting in to see a neurosurgeon and assess how well this new pain management regimen is working.   Problem # 2:  DM (ICD-250.00) [name]' diabetes is still poorly controlled. In addition to its importance to her overall health, achieving  better diabetic control will also help her become a better candidate for spine surgery. We will increase her Lantus to 35 units each night and reassess when she returns for follow-up.   Her updated medication list for this problem includes:    Metformin Hcl 1000 Mg Tabs (Metformin hcl) .Marland Kitchen... Take 1 tablet by mouth two times a day    Glucotrol 10 Mg Tabs (Glipizide) .Marland Kitchen... Take one tablet once a  day.    Avapro 150 Mg Tabs (Irbesartan) .Marland Kitchen... Take 1 tablet by mouth once a day    Lantus Solostar 100 Unit/ml Soln (Insulin glargine) ..... Inject 35 units under the skin at bedtime.  Problem # 3:  HYPERLIPIDEMIA (ICD-272.4) Total and LDL cholesterol was improved when last  checked in June; however, LDL (127) is still not at goal. Continue current managment and we will reassess at follow-up.   Her updated medication list for this problem includes:    Simvastatin 40 Mg Tabs (Simvastatin) .Marland Kitchen... Take 1 pill by mouth daily.  Complete Medication List: 1)  One Daily Womens Tabs (Multiple vitamins-minerals) 2)  Metformin Hcl 1000 Mg Tabs (Metformin hcl) .... Take 1 tablet by mouth two times a day 3)  Glucotrol 10 Mg Tabs (Glipizide) .... Take one tablet once a  day. 4)  Simvastatin 40 Mg Tabs (Simvastatin) .... Take 1 pill by mouth daily. 5)  Avapro 150 Mg Tabs (Irbesartan) .... Take 1 tablet by mouth once a day 6)  Hydrochlorothiazide 25 Mg Tabs (Hydrochlorothiazide) .... Take one tablet daily for blood pressure. 7)  Vitamin D (ergocalciferol) 50000 Unit Caps (Ergocalciferol) .... Take one tablet weekly for 8 weeks. 8)  Lantus Solostar 100 Unit/ml Soln (Insulin glargine) .... Inject 35 units under the skin at bedtime. 9)  Unifine Pentips 31g X 8 Mm Misc (Insulin pen needle) .... Use as directed. 10)  Prilosec 40 Mg Cpdr (Omeprazole) .... Take 1 pill by mouth two times a day 11)  Prednisone (pak) 10 Mg Tabs (Prednisone) .... 6 day dose pak 60mg  start. take as directed. 12)  Gabapentin 800 Mg Tabs  (Gabapentin) .... Take 1 tablet by mouth three times a day 13)  Percocet 5-325 Mg Tabs (Oxycodone-acetaminophen) .... Take 1 tablet every 6 hours as needed for pain 14)  Celebrex 200 Mg Caps (Celecoxib) .... Take one tablet twice per day for acute pain  Patient Instructions: 1)  Please schedule a follow-up appointment in 1 month. Prescriptions: UNIFINE PENTIPS 31G X 8 MM MISC (INSULIN PEN NEEDLE) use as directed.  #1box x 5   Entered and Authorized by:   Whitney Post MD   Signed by:   Whitney Post MD on 01/01/2010   Method used:   Print then Give to Patient   RxID:   [id number] LANTUS SOLOSTAR 100 UNIT/ML SOLN (INSULIN GLARGINE) Inject 35 units under the skin at bedtime.  #1 mo supply x 0   Entered and Authorized by:   Whitney Post MD   Signed by:   Whitney Post MD on 01/01/2010   Method used:   Print then Give to Patient   RxID:   [id number] PRILOSEC 40 MG CPDR (OMEPRAZOLE) take 1 pill by mouth two times a day  #30 x 6   Entered and Authorized by:   Whitney Post MD   Signed by:   Whitney Post MD on 01/01/2010   Method used:   Print then Give to Patient   RxID:   [id number] VITAMIN D (ERGOCALCIFEROL) 50000 UNIT CAPS (ERGOCALCIFEROL) Take one tablet weekly for 8 weeks.  #4 x 0   Entered and Authorized by:   Whitney Post MD   Signed by:   Whitney Post MD on 01/01/2010   Method used:   Print then Give to Patient   RxID:   [id number] HYDROCHLOROTHIAZIDE 25 MG TABS (HYDROCHLOROTHIAZIDE) Take one tablet daily for blood pressure.  #30 x 2   Entered and Authorized by:   Whitney Post MD   Signed by:   Whitney Post MD on 01/01/2010   Method used:   Print then Give to Patient   RxID:   [id number] AVAPRO 150 MG TABS (IRBESARTAN) Take 1 tablet by mouth once a day  #90 x 0   Entered and Authorized by:   Rubin Payor  Odis Luster MD   Signed by:   Whitney Post MD on 01/01/2010   Method used:   Print then Give to Patient   RxID:   [id number] SIMVASTATIN 40 MG TABS  (SIMVASTATIN) take 1 pill by mouth daily.  #30 x 0   Entered and Authorized by:   Whitney Post MD   Signed by:   Whitney Post MD on 01/01/2010   Method used:   Print then Give to Patient   RxID:   [id number] GLUCOTROL 10 MG TABS (GLIPIZIDE) Take one tablet once a  day.  #0 x 0   Entered and Authorized by:   Whitney Post MD   Signed by:   Whitney Post MD on 01/01/2010   Method used:   Print then Give to Patient   RxID:   [id number] METFORMIN HCL 1000 MG TABS (METFORMIN HCL) Take 1 tablet by mouth two times a day  #60 x 4   Entered and Authorized by:   Whitney Post MD   Signed by:   Whitney Post MD on 01/01/2010   Method used:   Print then Give to Patient   RxID:   [id number] GABAPENTIN 800 MG TABS (GABAPENTIN) Take 1 tablet by mouth three times a day  #30 x 2   Entered and Authorized by:   Whitney Post MD   Signed by:   Whitney Post MD on 01/01/2010   Method used:   Reprint   RxID:   [id number] PERCOCET 5-325 MG TABS (OXYCODONE-ACETAMINOPHEN) Take 1 tablet every 6 hours as needed for pain  #90 x 0   Entered and Authorized by:   Whitney Post MD   Signed by:   Whitney Post MD on 01/01/2010   Method used:   Reprint   RxID:   [id number] PERCOCET 5-325 MG TABS (OXYCODONE-ACETAMINOPHEN) Take 1 tablet every 6 hours as needed for pain  #90 x 0   Entered and Authorized by:   Whitney Post MD   Signed by:   Whitney Post MD on 01/01/2010   Method used:   Print then Give to Patient   RxID:   [id number] GABAPENTIN 800 MG TABS (GABAPENTIN) Take 1 tablet by mouth three times a day  #30 x 2   Entered and Authorized by:   Whitney Post MD   Signed by:   Whitney Post MD on 01/01/2010   Method used:   Electronically to        [facility] (retail)       [address]       [place], [place]  [address]       Ph: [phone]       Fax: [phone]   RxID:   [id number]   Prevention & Chronic Care Immunizations   Influenza  vaccine: Not documented   Influenza vaccine deferral: Deferred  (08/31/2009)   Influenza vaccine due: 02/11/2010    Tetanus booster: Not documented   Td booster deferral: Deferred  (08/31/2009)    Pneumococcal vaccine: Not documented   Pneumococcal vaccine deferral: Deferred  (08/31/2009)  Other Screening   Pap smear: Not documented    Mammogram: Not documented   Mammogram due: 06/13/2009   Smoking status: quit  (01/01/2010)  Diabetes Mellitus   HgbA1C: 9.2  (09/30/2009)    Eye exam: Not documented   Diabetic eye exam action/deferral: Ophthalmology referral  (08/31/2009)    Foot exam: yes  (06/11/2009)   Foot exam action/deferral: Do today  High risk foot: Not documented   Foot care education: Not documented    Urine microalbumin/creatinine ratio: 6.6  (09/30/2009)    Diabetes flowsheet reviewed?: Yes   Progress toward A1C goal: Unchanged  Lipids   Total Cholesterol: 184  (11/23/2009)   LDL: 122  (11/23/2009)   LDL Direct: Not documented   HDL: 35  (11/23/2009)   Triglycerides: 133  (11/23/2009)    SGOT (AST): 35  (11/23/2009)   SGPT (ALT): 38  (11/23/2009)   Alkaline phosphatase: 64  (11/23/2009)   Total bilirubin: 0.3  (11/23/2009)    Lipid flowsheet reviewed?: Yes   Progress toward LDL goal: Improved  Hypertension   Last Blood Pressure: 114 / 74  (01/01/2010)   Serum creatinine: 0.65  (11/23/2009)   Serum potassium 3.7  (11/23/2009)    Hypertension flowsheet reviewed?: Yes   Progress toward BP goal: At goal  Self-Management Support :   Personal Goals (by the next clinic visit) :     Personal A1C goal: 7  (04/03/2009)     Personal blood pressure goal: 140/90  (04/03/2009)     Personal LDL goal: 70  (04/03/2009)    Diabetes self-management support: Copy of home glucose meter record, Written self-care plan, Education handout, Resources for patients handout  (11/30/2009)   Last diabetes self-management training by diabetes educator: 10/19/2009     Hypertension self-management support: Written self-care plan, Education handout, Resources for patients handout  (11/30/2009)    Lipid self-management support: Written self-care plan, Education handout, Resources for patients handout  (11/30/2009)    Appended Document: RA/NEEDS ROUTINE CHECKUP/CH I have discussed the care of this patient in detail with the resident and agree fully with the documentation completed.

## 2010-07-15 NOTE — Letter (Signed)
Summary: Generic Letter  [facility]  [address]   [place], [place] [address]   Phone: [phone]  Fax: [phone]         02/22/2010   RE:  NAMINE BEAHM  11/10/2060   To Whom It May Concern:  Ms. Kroening is a patient here at [facility].   Please assist Ms. Ahmed with food [employer].  She is unemployed and her unemployment will be stopping next month. Her husband works Printmaker income at the poverty level.   Sincerely,    Dorothe Pea, LCSW Clinical Social Worker

## 2010-07-15 NOTE — Letter (Signed)
Summary: [facility]  [employer]   Imported By: Florinda Marker 08/14/2009 16:45:35  _____________________________________________________________________  External Attachment:    Type:   Image     Comment:   External Document

## 2010-07-15 NOTE — Progress Notes (Signed)
Summary: med refill/gp  Phone Note Refill Request Message from:  Patient on December 07, 2009 3:12 PM  Refills Requested: Medication #1:  LANTUS SOLOSTAR 100 UNIT/ML SOLN Inject 30 units under the skin at bedtime. Pt. stated she picked up Lantus insulin today (100 units) but  MAPP had previous instruction of 15 units but it has been increased to 30 units 11/30/09 by Dr.  Aleene Davidson. She said the 100units given to her will not last the whole month.   Thanks   Method Requested: Fax to Local Pharmacy Initial call taken by: Chinita Pester RN,  December 07, 2009 3:12 PM    Prescriptions: LANTUS SOLOSTAR 100 UNIT/ML SOLN (INSULIN GLARGINE) Inject 30 units under the skin at bedtime.  #1 month x 11   Entered by:   Zoila Shutter MD   Authorized by:   Marland Kitchen [facility] ATTENDING DESKTOP   Signed by:   Zoila Shutter MD on 12/07/2009   Method used:   Faxed to ...       [facility] (retail)       [address]       [place], [place]  [address]       Ph: [phone]       Fax: [phone]   RxID:   [id number]

## 2010-07-15 NOTE — Consult Note (Signed)
Summary: Dr. Ezzard Standing  Dr. Ezzard Standing   Imported By: Florinda Marker 06/22/2009 14:47:21  _____________________________________________________________________  External Attachment:    Type:   Image     Comment:   External Document

## 2010-07-15 NOTE — Progress Notes (Signed)
Summary: refill/ hla  Phone Note Refill Request Message from:  Patient on January 08, 2010 10:41 AM  Refills Requested: Medication #1:  CELEBREX 200 MG CAPS Take one tablet twice per day for acute pain. Initial call taken by: Marin Roberts RN,  January 08, 2010 10:41 AM

## 2010-07-15 NOTE — Consult Note (Signed)
[facility]   Imported By: Margie Billet 02/11/2010 14:55:09  _____________________________________________________________________  External Attachment:    Type:   Image     Comment:   External Document

## 2010-07-15 NOTE — Progress Notes (Signed)
Summary: phone/gg  Phone Note Call from Patient   Caller: Patient Summary of Call: Pt called  states she had soft stool and when she wiped she noted blood on paper. When she got up toilet was full of blood.  She did break out in sweat after BM  No Hx of bleeding for past year.   Denies abd pain.  Pt advised to go to ED for evaluation/ we have no appointments tomorrow Patient/caller verbalizes understanding of these instructions.  Initial call taken by: Merrie Roof RN,  February 18, 2010 5:24 PM  Follow-up for Phone Call        Agree Follow-up by: Blanch Media MD,  February 18, 2010 5:37 PM

## 2010-07-15 NOTE — Consult Note (Signed)
Summary: [facility]   Imported By: Florinda Marker 07/29/2009 11:02:57  _____________________________________________________________________  External Attachment:    Type:   Image     Comment:   External Document

## 2010-07-15 NOTE — Letter (Signed)
Summary: 10-08-2009-10-26-2009/BLOOD SUGAR  10-08-2009-10-26-2009/BLOOD SUGAR   Imported By: Margie Billet 11/20/2009 08:41:08  _____________________________________________________________________  External Attachment:    Type:   Image     Comment:   External Document

## 2010-07-15 NOTE — Medication Information (Signed)
Summary: OXYCODONE  OXYCODONE   Imported By: Margie Billet 06/30/2010 11:38:58  _____________________________________________________________________  External Attachment:    Type:   Image     Comment:   External Document

## 2010-07-15 NOTE — Progress Notes (Signed)
Summary: Soc. Work  Programme researcher, broadcasting/film/video of Call: 15 minutes.  Discussed with patient need to revisit with [name] regarding her finances and now that  6 months have past her financial info  may be viewed differently.   Advised her what to bring with her.  She will visit with me and [name] on Monday at around 10 AM for further assessment and planning related to Disability and Medicaid.

## 2010-07-15 NOTE — Progress Notes (Signed)
  Phone Note Other Incoming   Summary of Call: ED Physician called me on [phone] and reports that he doesn't think that the patient has any Neurosurgical emergencies and doesn't think that she needs a neurosurgical evaluation in the ER. He reports that he referred her to a neurosurgeon as an outpatient and recommended to contact clinic for any further problems. I will forward this information to Dr. Claudette Laws and Dr. Rogelia Boga. Initial call taken by: Blondell Reveal MD,  January 20, 2010 9:40 PM

## 2010-07-15 NOTE — Progress Notes (Signed)
Summary: phone/gg  Phone Note Call from Patient   Caller: Patient Summary of Call: Pt called stating she went to ED this AM by EMS because she had chest pain and severe pain in leg.  They said pain was from disc in back and wanted her to have f/u here.   Will see pt tomorrow. Initial call taken by: Merrie Roof RN,  February 11, 2010 4:41 PM

## 2010-07-15 NOTE — Progress Notes (Signed)
Summary: cbg elevated, blurry vision, bp elevated/ hla  Phone Note Call from Patient   Summary of Call: pt calls and states she might need to be seen before her june appt, maybe be put on insulin, cbg's are running above 200, bp elevated, today 140/90...she is concerned mostly because her vision has become blurry. appt is given for this pm, she is reminded to bring her meter and all meds, she is agreeable. Initial call taken by: Marin Roberts RN,  Oct 19, 2009 12:41 PM

## 2010-07-15 NOTE — Letter (Signed)
Summary: [facility]  [facility]   Imported By: Florinda Marker 08/21/2009 14:39:15  _____________________________________________________________________  External Attachment:    Type:   Image     Comment:   External Document

## 2010-07-15 NOTE — Letter (Signed)
Summary: ACCU-CHEK 02-24-2010/03-25-2010  ACCU-CHEK 02-24-2010/03-25-2010   Imported By: Margie Billet 03/30/2010 14:48:09  _____________________________________________________________________  External Attachment:    Type:   Image     Comment:   External Document

## 2010-07-15 NOTE — Assessment & Plan Note (Signed)
Summary: CLASS/ SB.  Patient had to leave. Told front office she would reschedule.  Allergies: No Known Drug Allergies

## 2010-07-15 NOTE — Letter (Signed)
Summary: BLOOD SUGAR LOG REPORT  BLOOD SUGAR LOG REPORT   Imported By: Margie Billet 02/10/2010 14:20:30  _____________________________________________________________________  External Attachment:    Type:   Image     Comment:   External Document

## 2010-07-15 NOTE — Progress Notes (Signed)
Summary: Phone/ additional oxycodone  Phone Note Call from Patient   Caller: Patient Summary of Call: Pt called because she received a Rx for oxycodone from [facility] after having surgery.  she had it filled at [facility] on 06/18/10 Pt did sign a pain contract with [place] so wanted [place] to be aware of why she received meds from someone else. Initial call taken by: Merrie Roof RN,  June 21, 2010 11:43 AM  Follow-up for Phone Call        Thank you for letting me know. Patient just underwent extensive back surgery and I agree that additional pain medicine prescribed by [facility] is appropriate.  Follow-up by: Whitney Post MD,  June 22, 2010 8:42 AM

## 2010-07-15 NOTE — Progress Notes (Signed)
Summary: phone note/gp  Phone Note Call from Patient   Caller: Patient Summary of Call: Pt. states she went to the ED 8/10 and was given IV pain medication and sent home.  States the pain is worse. Pt. wants to know what she needs to do next.  Telephone # [phone] Initial call taken by: Chinita Pester RN,  January 22, 2010 11:07 AM  Follow-up for Phone Call        Patient called and message left that Rx for percocet can be picked up Satruday AM at [facility] on [address] here in [place].

## 2010-07-15 NOTE — Progress Notes (Signed)
Summary: phone/gg  Phone Note Call from Patient   Caller: Patient Summary of Call: Pt called asking for a referral to a GYN, Dr Osborn Coho # [phone]   this is for her annual exam. She was just approved for [place] [employer] so needs referrals from PCP Also need referral for mammogram ( place [place] -  [address]).  Initial call taken by: Merrie Roof RN,  June 08, 2010 3:06 PM  Follow-up for Phone Call        I will make those referrals. Thank you.  Follow-up by: Whitney Post MD,  June 14, 2010 8:43 AM  New Problems: ROUTINE GYNECOLOGICAL EXAMINATION (ICD-V72.31)   New Problems: ROUTINE GYNECOLOGICAL EXAMINATION (ICD-V72.31)

## 2010-07-15 NOTE — Progress Notes (Signed)
Summary: med refill/gp  Phone Note Refill Request Message from:  Patient on December 07, 2009 2:27 PM  Refills Requested: Medication #1:  COZAAR 50 MG TABS take 1 pill by mouth daily.  Medication #2:  PRILOSEC 40 MG CPDR take 1 pill by mouth two times a day. Labs June 13  and last OV June  20.   Method Requested: Fax to Local Pharmacy Initial call taken by: Chinita Pester RN,  December 07, 2009 2:27 PM    Prescriptions: PRILOSEC 40 MG CPDR (OMEPRAZOLE) take 1 pill by mouth two times a day  #30 x 6   Entered and Authorized by:   Zoila Shutter MD   Signed by:   Zoila Shutter MD on 12/07/2009   Method used:   Faxed to ...       [facility] (retail)       [address]       [place], [place]  [address]       Ph: [phone]       Fax: [phone]   RxID:   [id number] COZAAR 50 MG TABS (LOSARTAN POTASSIUM) take 1 pill by mouth daily.  #30 x 6   Entered and Authorized by:   Zoila Shutter MD   Signed by:   Zoila Shutter MD on 12/07/2009   Method used:   Faxed to ...       [facility] (retail)       [address]       [place], [place]  [address]       Ph: [phone]       Fax: [phone]   RxID:   [id number]

## 2010-07-15 NOTE — Progress Notes (Signed)
  Phone Note Call from Patient   Reason for Call: Referral Summary of Call: Pt called in asking for referral to Dr. Ezzard Standing. Upon further questioning she reports she has tonsil problem and she was seen by him in the past. She called their office and they said she can not be seen without referral. I asked patient to do one of the following 1. Wait till Monday, when clinic opens, may be she can be worked in, assessed and referred if need be. 2. Visit urgent care or ED for evaluation. Pt voiced understanding.  Initial call taken by: Clerance Lav MD,  June 11, 2010 10:53 AM

## 2010-07-15 NOTE — Progress Notes (Signed)
Summary: Refill/gh  Phone Note Refill Request Message from:  Fax from Pharmacy on January 15, 2010 3:39 PM  Refills Requested: Medication #1:  SIMVASTATIN 40 MG TABS take 1 pill by mouth daily. Pharmacy would like to substitute Lipitor 20 mg tablets.  Would you conside a change in the ststins   Method Requested: Fax to Local Pharmacy Initial call taken by: [name] RN,  January 15, 2010 3:40 PM  Follow-up for Phone Call        I have already sent in    New/Updated Medications: LIPITOR 20 MG TABS (ATORVASTATIN CALCIUM) one a day Prescriptions: LIPITOR 20 MG TABS (ATORVASTATIN CALCIUM) one a day  #31 x 6   Entered and Authorized by:   Zoila Shutter MD   Signed by:   Zoila Shutter MD on 01/15/2010   Method used:   Faxed to ...       [facility] (retail)       [address] [place], [place]  [address]       Ph: [phone]       Fax: [phone]   RxID:   [id number]

## 2010-07-15 NOTE — Progress Notes (Signed)
Summary: refill/ hla  Phone Note Refill Request Message from:  Patient on July 13, 2009 12:15 PM  Refills Requested: Medication #1:  ULTRAM 50 MG TABS Take 1 tablet by mouth every 8 hours as needed for pain   Dosage confirmed as above?Dosage Confirmed   Supply Requested: 1 month   Last Refilled: 12/30 Initial call taken by: Marin Roberts RN,  July 13, 2009 12:15 PM  Follow-up for Phone Call        Will give one month supply as has appointment on February 10th.  Pain regimen may be adjusted at that time. Follow-up by: Doneen Poisson MD,  July 13, 2009 2:10 PM    Prescriptions: Janean Sark 50 MG TABS (TRAMADOL HCL) Take 1 tablet by mouth every 8 hours as needed for pain  #30 x 0   Entered and Authorized by:   Doneen Poisson MD   Signed by:   Doneen Poisson MD on 07/13/2009   Method used:   Electronically to        [facility] [id number] (retail)       [address]       [place], [place]  [id number]       Ph: [phone] or [phone]       Fax: [phone]   RxID:   [id number]

## 2010-07-15 NOTE — Assessment & Plan Note (Signed)
Summary: 2WK F/U/EST/VS   Vital Signs:  Patient profile:   50 year old female Height:      64.5 inches (163.83 cm) Weight:      243.04 pounds (110.47 kg) BMI:     41.22 Temp:     98.9 degrees F (37.17 degrees C) oral Pulse rate:   94 / minute BP sitting:   136 / 88  (left arm)  Vitals Entered By: [name] RN (August 31, 2009 3:09 PM) CC: Depression Is Patient Diabetic? Yes Did you bring your meter with you today? Yes Pain Assessment Patient in pain? yes     Location: right leg Intensity: 3 Type: numbness Onset of pain  Constant Nutritional Status BMI of > 30 = obese CBG Result 230  Have you ever been in a relationship where you felt threatened, hurt or afraid?No   Does patient need assistance? Functional Status Self care Ambulation Normal Comments Had an injection in her leg.  Gets numb now.  Done at [facility].  Cough since having Tonsils out.  Makes body and head ache  Called Dr. Ezzard Standing nurse said it was not coming frm having had her Tonsils out.  Needs refills on meds.  Coughing up clear mucous.  Wants meds to go to [facility]   Primary Care Provider:  Jason Coop MD  CC:  Depression.  History of Present Illness: [name] is a 50 yo lady with PMH as outlined in the EMR comes today for a f/u visit.   1. HTN: She is not taking cozaar as she is not able to afford it.   2. Cough: She is not taking ACEi but still has cough. She called Dr. Allene Pyo office and the nurse told her this is not coming from surgery. She states the cough started after surgery. The cough is persistent. SHe takes H2 blockers and not PPI.   3. HL: She is taking her pravastatin without any problem.   4. Obesity: She is not so active recently because of the back and knee pain. SHe attended D. Riley's class.   5. R. Knee pain: She saw Dr. Remigio Eisenmenger and she had MRI done and she was found to have slipped disc and she was referred for cortisone injection and she already had one  shot and will get next one next week. SHe already started to feel better. She was also started on percocet per Dr. Remigio Eisenmenger.   6. Paresthesias: She has not yet started taking her neurontin.   7. DM: She is taking her oral pills and checks her CBG 2-3 times a day. In morning it is usu little less than 200 and in the evening it is slightly higher than 200. She had recent steroid injection and because of the pain she was not able to remain more active.   Depression History:      The patient denies a depressed mood most of the day and a diminished interest in her usual daily activities.         Preventive Screening-Counseling & Management  Alcohol-Tobacco     Smoking Status: quit     Year Quit: 2000  Current Medications (verified): 1)  One Daily Womens  Tabs (Multiple Vitamins-Minerals) 2)  Metformin Hcl 1000 Mg Tabs (Metformin Hcl) .... Take 1 Tablet By Mouth Two Times A Day 3)  Omeprazole 40 Mg Cpdr (Omeprazole) .... Take 1 Pill By Mouth Two Times A Day. 4)  Glucotrol 10 Mg Tabs (Glipizide) .... Take One Tablet Two  Times A Day. 5)  Pravachol 40 Mg Tabs (Pravastatin Sodium) .... Take 1 Pill By Mouth Daily. 6)  Ultram 50 Mg Tabs (Tramadol Hcl) .... Take 1 Tablet By Mouth Every 8 Hours As Needed For Pain 7)  Cozaar 50 Mg Tabs (Losartan Potassium) .... Take 1 Pill By Mouth Daily. 8)  Hydrochlorothiazide 25 Mg Tabs (Hydrochlorothiazide) .... Take One Tablet Daily For Blood Pressure. 9)  Vitamin D (Ergocalciferol) 50000 Unit Caps (Ergocalciferol) .... Take One Tablet Weekly For 8 Weeks. 10)  Neurontin 300 Mg Caps (Gabapentin) .... Take 1 Pill By Mouth At Bedtime 11)  Azithromycin 250 Mg Tabs (Azithromycin) .... Take 1 Pill By Mouth Daily For 5 Days.  Allergies: No Known Drug Allergies  Review of Systems      See HPI  Physical Exam  General:  alert.   Mouth:  pharynx pink and moist, no erythema, no exudates, no posterior lymphoid hypertrophy, and no postnasal drip.   Lungs:  normal breath  sounds, no crackles, and no wheezes.   Heart:  normal rate, regular rhythm, no murmur, and no gallop.   Abdomen:  soft, non-tender, normal bowel sounds, no distention, no masses, no guarding, and no rigidity.   Extremities:  trace left pedal edema and trace right pedal edema.   Neurologic:  alert & oriented X3.   Cervical Nodes:  no anterior cervical adenopathy and no posterior cervical adenopathy.     Impression & Recommendations:  Problem # 1:  COUGH (ICD-786.2) Pt has dry cough which started right after her tonsillectomy. We stopped ACEi and she still  has bothersome cough, sometimes he has stress incontinence from the persistent cough. She called Dr. Allene Pyo office and his nurse told her the cough is not related to surgery. Although she has completed a course of antibiotics at the time of tonsillectomy, I will prescribe her 1 course of Azithromycin again. I will also check CXR, although her CXR at the end of 1/11 was negative, but that was done for pre-op and she had no symptoms then. She denies any heartburn. She was on nexium before and she is no longer taking it as she cannot afford it. She is currently on H2 blocker. I asked her to take Omeprazole, which should be cheaper than Nexium. Will try this for 3-4 additional weeks. If her cough persists despite this, she needs to be worked up for cough variant asthma and may also need nasal steroids for post-nasal drip.   Orders: CXR- 2view (CXR)  Problem # 2:  HYPERTENSION (ICD-401.9) She is not taking Cozaar as it cost around 55 dollars at [facility] although it is generic there. [name] found that at [facility] pharmacy it costs 25 dollars per month and she said she can afford it.   The following medications were removed from the medication list:    Cozaar 50 Mg Tabs (Losartan potassium) .Marland Kitchen... Take 1 pill by mouth daily. Her updated medication list for this problem includes:    Cozaar 50 Mg Tabs (Losartan potassium) .Marland Kitchen... Take 1 pill by mouth  daily.    Hydrochlorothiazide 25 Mg Tabs (Hydrochlorothiazide) .Marland Kitchen... Take one tablet daily for blood pressure.  BP today: 136/88 Prior BP: 128/85 (08/06/2009)  Labs Reviewed: K+: 3.8 (07/28/2009) Creat: : 0.71 (07/28/2009)   Chol: 192 (07/28/2009)   HDL: 34 (07/28/2009)   LDL: 133 (07/28/2009)   TG: 126 (07/28/2009)  Problem # 3:  HYPERLIPIDEMIA (ICD-272.4) Will continue the same and check FLP and CMET in next appt.  Her updated medication  list for this problem includes:    Pravachol 40 Mg Tabs (Pravastatin sodium) .Marland Kitchen... Take 1 pill by mouth daily.  Labs Reviewed: SGOT: 37 (07/28/2009)   SGPT: 51 (07/28/2009)   HDL:34 (07/28/2009), 33 (06/12/2009)  LDL:133 (07/28/2009), 131 (06/12/2009)  Chol:192 (07/28/2009), 187 (06/12/2009)  Trig:126 (07/28/2009), 114 (06/12/2009)  Problem # 4:  DM (ICD-250.00) Her CBGs are elevated; in AM they are little less than 200 and in PM they are higher than 200. She is still not ready for insulin. She has DM for 2 years and she has BMI of >41. So physiologically she may do well without insulin as long as she loses some weight. But she is not so active recently because of knee pain and back pain. After getting cortisone shots her pain is gettting better and she anticipates to become more active in the future. She eats more healthy food now. She has seen D. Victory Dakin and attended her lifestyle class. So plan for now is to continue her pills, follow home CBGs and try non-pharmacological measures as well. She has recently seen her eye MD.    Her updated medication list for this problem includes:    Metformin Hcl 1000 Mg Tabs (Metformin hcl) .Marland Kitchen... Take 1 tablet by mouth two times a day    Glucotrol 10 Mg Tabs (Glipizide) .Marland Kitchen... Take one tablet two times a day.    Cozaar 50 Mg Tabs (Losartan potassium) .Marland Kitchen... Take 1 pill by mouth daily.  Labs Reviewed: Creat: 0.71 (07/28/2009)    Reviewed HgBA1c results: 8.0 (06/25/2009)  7.5 (04/03/2009)  Problem # 5:  OBESITY,  MORBID (ICD-278.01) See DM.   Complete Medication List: 1)  One Daily Womens Tabs (Multiple vitamins-minerals) 2)  Metformin Hcl 1000 Mg Tabs (Metformin hcl) .... Take 1 tablet by mouth two times a day 3)  Omeprazole 40 Mg Cpdr (Omeprazole) .... Take 1 pill by mouth two times a day. 4)  Glucotrol 10 Mg Tabs (Glipizide) .... Take one tablet two times a day. 5)  Pravachol 40 Mg Tabs (Pravastatin sodium) .... Take 1 pill by mouth daily. 6)  Ultram 50 Mg Tabs (Tramadol hcl) .... Take 1 tablet by mouth every 8 hours as needed for pain 7)  Cozaar 50 Mg Tabs (Losartan potassium) .... Take 1 pill by mouth daily. 8)  Hydrochlorothiazide 25 Mg Tabs (Hydrochlorothiazide) .... Take one tablet daily for blood pressure. 9)  Vitamin D (ergocalciferol) 50000 Unit Caps (Ergocalciferol) .... Take one tablet weekly for 8 weeks. 10)  Neurontin 300 Mg Caps (Gabapentin) .... Take 1 pill by mouth at bedtime 11)  Azithromycin 250 Mg Tabs (Azithromycin) .... Take 1 pill by mouth daily for 5 days.  Other Orders: Capillary Blood Glucose/CBG ([address])  Patient Instructions: 1)  Please schedule a follow-up appointment in 1 month. 2)  Limit your Sodium (Salt) to less than 2 grams a day(slightly less than 1/2 a teaspoon) to prevent fluid retention, swelling, or worsening of symptoms. 3)  It is important that you exercise regularly at least 20 minutes 5 times a week. If you develop chest pain, have severe difficulty breathing, or feel very tired , stop exercising immediately and seek medical attention. 4)  You need to lose weight. Consider a lower calorie diet and regular exercise.  5)  Check your blood sugars regularly. If your readings are usually above : or below 70 you should contact our office. 6)  It is important that your Diabetic A1c level is checked every 3 months. 7)  See your  eye doctor yearly to check for diabetic eye damage. 8)  Check your feet each night for sore areas, calluses or signs of infection. 9)   Check your Blood Pressure regularly. If it is above: you should make an appointment. Prescriptions: AZITHROMYCIN 250 MG TABS (AZITHROMYCIN) take 1 pill by mouth daily for 5 days.  #5 x 0   Entered and Authorized by:   Jason Coop MD   Signed by:   Jason Coop MD on 08/31/2009   Method used:   Electronically to        [facility] (retail)       [address]       [place], [place]  [address]       Ph: [phone]       Fax: [phone]   RxID:   [id number] NEURONTIN 300 MG CAPS (GABAPENTIN) take 1 pill by mouth at bedtime  #30 x 2   Entered and Authorized by:   Jason Coop MD   Signed by:   Jason Coop MD on 08/31/2009   Method used:   Electronically to        [facility] (retail)       [address]       [place], [place]  [address]       Ph: [phone]       Fax: [phone]   RxID:   [id number] OMEPRAZOLE 40 MG CPDR (OMEPRAZOLE) take 1 pill by mouth two times a day.  #60 x 1   Entered and Authorized by:   Jason Coop MD   Signed by:   Jason Coop MD on 08/31/2009   Method used:   Electronically to        [facility] (retail)       [address]       [place], [place]  [address]       Ph: [phone]       Fax: [phone]   RxID:   [id number]   Prevention & Chronic Care Immunizations   Influenza vaccine: Not documented   Influenza vaccine deferral: Deferred  (08/31/2009)   Influenza vaccine due: 02/11/2010    Tetanus booster: Not documented   Td booster deferral: Deferred  (08/31/2009)    Pneumococcal vaccine: Not documented   Pneumococcal vaccine deferral: Deferred  (08/31/2009)  Other Screening   Pap smear: Not documented    Mammogram: Not documented   Mammogram due: 06/13/2009  Reports requested:   Last Pap report requested.  Smoking status: quit  (08/31/2009)  Diabetes Mellitus   HgbA1C: 8.0   (06/25/2009)    Eye exam: Not documented   Diabetic eye exam action/deferral: Ophthalmology referral  (08/31/2009)    Foot exam: yes  (06/11/2009)   Foot exam action/deferral: Do today   High risk foot: Not documented   Foot care education: Not documented    Urine microalbumin/creatinine ratio: 4.2  (04/03/2009)    Diabetes flowsheet reviewed?: Yes   Progress toward A1C goal: Unchanged  Lipids   Total Cholesterol: 192  (07/28/2009)   LDL: 133  (07/28/2009)   LDL Direct: Not documented   HDL: 34  (07/28/2009)   Triglycerides: 126  (07/28/2009)    SGOT (AST): 37  (07/28/2009)   SGPT (ALT): 51  (07/28/2009)   Alkaline phosphatase: 73  (07/28/2009)   Total bilirubin:  0.3  (07/28/2009)    Lipid flowsheet reviewed?: Yes   Progress toward LDL goal: Unchanged  Hypertension   Last Blood Pressure: 136 / 88  (08/31/2009)   Serum creatinine: 0.71  (07/28/2009)   Serum potassium 3.8  (07/28/2009)    Hypertension flowsheet reviewed?: Yes   Progress toward BP goal: Unchanged  Self-Management Support :   Personal Goals (by the next clinic visit) :     Personal A1C goal: 7  (04/03/2009)     Personal blood pressure goal: 140/90  (04/03/2009)     Personal LDL goal: 70  (04/03/2009)    Patient will work on the following items until the next clinic visit to reach self-care goals:     Medications and monitoring: take my medicines every day, check my blood sugar, bring all of my medications to every visit, examine my feet every day  (08/31/2009)     Eating: drink diet soda or water instead of juice or soda, eat more vegetables, use fresh or frozen vegetables, eat foods that are low in salt, eat baked foods instead of fried foods, eat fruit for snacks and desserts, limit or avoid alcohol  (08/31/2009)     Activity: take a 30 minute walk every day  (08/31/2009)    Diabetes self-management support: Written self-care plan, Education handout  (08/31/2009)   Diabetes care plan printed    Diabetes education handout printed   Last diabetes self-management training by diabetes educator: 08/18/2009    Hypertension self-management support: Written self-care plan, Education handout  (08/31/2009)   Hypertension self-care plan printed.   Hypertension education handout printed    Lipid self-management support: Written self-care plan, Education handout  (08/31/2009)   Lipid self-care plan printed.   Lipid education handout printed   Nursing Instructions: Refer for screening diabetic eye exam (see order) Request report of last Pap      Vital Signs:  Patient profile:   50 year old female Height:      64.5 inches (163.83 cm) Weight:      243.04 pounds (110.47 kg) BMI:     41.22 Temp:     98.9 degrees F (37.17 degrees C) oral Pulse rate:   94 / minute BP sitting:   136 / 88  (left arm)  Vitals Entered By: [name] RN (August 31, 2009 3:09 PM)

## 2010-07-15 NOTE — Consult Note (Signed)
Summary: ENT: Dr. Ezzard Standing  ENT: Dr. Ezzard Standing   Imported By: Florinda Marker 07/15/2009 16:58:25  _____________________________________________________________________  External Attachment:    Type:   Image     Comment:   External Document

## 2010-07-15 NOTE — Assessment & Plan Note (Signed)
Summary: 2WK F/U/EST/VS   Vital Signs:  Patient profile:   50 year old female Height:      64.5 inches (163.83 cm) Weight:      242.7 pounds (107.91 kg) BMI:     40.27 Temp:     97.3 degrees F (36.28 degrees C) oral Pulse rate:   95 / minute BP sitting:   128 / 85  (left arm) Cuff size:   large  Vitals Entered By: Theotis Barrio NT II (August 06, 2009 3:23 PM) CC: PAIN IN THROAT AND RIGHT LEG  #9  / MEDICATION REFILL  / MAKES HER COUGH Pain Assessment Patient in pain? yes     Location: THROAT/R-LEG Intensity:    3   /   9 Type: SORE  /SHARP Nutritional Status BMI of > 30 = obese CBG Result 118 CBG Device ID Pt OWN DEVICE  Have you ever been in a relationship where you felt threatened, hurt or afraid?No   Does patient need assistance? Functional Status Self care Ambulation Normal Comments PAIN IN THROAT AND RIGHT LEG #9  / MEDICATION REFILL /  UNABLE TO TAKE LISINOPRIL- MAKE HER COUGH   Primary Care Provider:  Jason Coop MD  CC:  PAIN IN THROAT AND RIGHT LEG  #9  / MEDICATION REFILL  / MAKES HER COUGH.  History of Present Illness: Ms. Bochicchio is a 50 yo lady with PMH as outlined in the EMR comes today for a f/u visit.   1. HTN: She is taking her medication including ACE without any problem.   2. Cough: She still has dry cough.   3. Obesity: She walks 30 minutes every other day. She changed her diet: she doesn't eat fried food, she eats fresh vegetables, she has cut down her coffee intake and chocalate.   4. DM: She is checking her CBG two times a day. Her CBG runs from 156 to 270 in AM, esp close to 200 and in PM she runs 183 to 302. She is taking her metformin and glipizide.   5. HL: She started to take new dose of pravastatin, without any problem.   6. Right knee pain: Pt experienced pain starting from the right buttocks and it radiates down and as it does so it gets worse. It reaches the tip of her right foot. Her most painful place is right calf. No back  pain. No leg swelling. She had these problems for a month or so. She had seen an orthopod in the past for a "slipped disc" on her back and she was trying to see the same doctor for right leg pain again now.   Depression History:      The patient denies a depressed mood most of the day and a diminished interest in her usual daily activities.         Preventive Screening-Counseling & Management  Alcohol-Tobacco     Smoking Status: quit     Year Quit: 2000  Caffeine-Diet-Exercise     Does Patient Exercise: yes     Type of exercise: WALKING/ DANCING     Exercise (avg: min/session):         Times/week:     3  Current Medications (verified): 1)  One Daily Womens  Tabs (Multiple Vitamins-Minerals) 2)  Metformin Hcl 1000 Mg Tabs (Metformin Hcl) .... Take 1 Tablet By Mouth Two Times A Day 3)  Nexium 40 Mg Cpdr (Esomeprazole Magnesium) .... Take 1 Tablet By Mouth Once A Day 4)  Glucotrol 10 Mg Tabs (Glipizide) .... Take One Tablet Two Times A Day. 5)  Pravachol 40 Mg Tabs (Pravastatin Sodium) .... Take 1 Pill By Mouth Daily. 6)  Ultram 50 Mg Tabs (Tramadol Hcl) .... Take 1 Tablet By Mouth Every 8 Hours As Needed For Pain 7)  Cozaar 50 Mg Tabs (Losartan Potassium) .... Take 1 Pill By Mouth Daily. 8)  Hydrochlorothiazide 25 Mg Tabs (Hydrochlorothiazide) .... Take One Tablet Daily For Blood Pressure. 9)  Vitamin D (Ergocalciferol) 50000 Unit Caps (Ergocalciferol) .... Take One Tablet Weekly For 8 Weeks. 10)  Neurontin 300 Mg Caps (Gabapentin) .... Take 1 Pill By Mouth At Bedtime 11)  Cozaar 50 Mg Tabs (Losartan Potassium) .... Take 1 Pill By Mouth Daily.  Allergies: No Known Drug Allergies  Review of Systems      See HPI  Physical Exam  Mouth:  pharynx pink and moist.   Lungs:  normal breath sounds, no crackles, and no wheezes.   Heart:  normal rate, regular rhythm, no murmur, and no gallop.   Abdomen:  soft, non-tender, normal bowel sounds, and no distention.   Msk:  Spine: No  deformity or tenderness over LS spine. SLRT negative. No hip tenderness b/l, no tenderness on the right lateral thigh.   R. Knee: No redness, swelling, or tenderness. ROM WNL. There was some pain with internal rotation of the right leg at the knee.   No swelling and tenderness over right calf.  Extremities:  trace left pedal edema and trace right pedal edema.   Neurologic:  alert & oriented X3.   Cervical Nodes:  no anterior cervical adenopathy and no posterior cervical adenopathy.     Impression & Recommendations:  Problem # 1:  HYPERTENSION (ICD-401.9) Pt has persistent dry cough, most probably from ACEi. Plan is to switch it to Cozaar at low dose, as she had DM. WIll f/u soon.  Her updated medication list for this problem includes:    Cozaar 50 Mg Tabs (Losartan potassium) .Marland Kitchen... Take 1 pill by mouth daily.    Hydrochlorothiazide 25 Mg Tabs (Hydrochlorothiazide) .Marland Kitchen... Take one tablet daily for blood pressure.  BP today: 128/85 Prior BP: 134/97 (07/23/2009)  Labs Reviewed: K+: 3.8 (07/28/2009) Creat: : 0.71 (07/28/2009)   Chol: 192 (07/28/2009)   HDL: 34 (07/28/2009)   LDL: 133 (07/28/2009)   TG: 126 (07/28/2009)  Problem # 2:  OBESITY, MORBID (ICD-278.01) Discussed at length regarding healthy eating, and regular exercises.   Problem # 3:  HYPERLIPIDEMIA (ICD-272.4) Will cont same for now.  Her updated medication list for this problem includes:    Pravachol 40 Mg Tabs (Pravastatin sodium) .Marland Kitchen... Take 1 pill by mouth daily.  Labs Reviewed: SGOT: 37 (07/28/2009)   SGPT: 51 (07/28/2009)   HDL:34 (07/28/2009), 33 (06/12/2009)  LDL:133 (07/28/2009), 131 (06/12/2009)  Chol:192 (07/28/2009), 187 (06/12/2009)  Trig:126 (07/28/2009), 114 (06/12/2009)  Problem # 4:  DM (ICD-250.00) Pt has elevated CBG both in AM and PM. I would like to start her on insulin, but Ms. Dykes wants to hold on that for now. She wants to work more on her weight, diet and exercise now. I think this is fine as  long as we keep on following her closely and if her home CBG still are high despite this attempt, we may need to start insulin. I will also refer her to D. Victory Dakin. Will discuss about her appt with eye MD on next appt.  Her updated medication list for this problem includes:    Metformin  Hcl 1000 Mg Tabs (Metformin hcl) .Marland Kitchen... Take 1 tablet by mouth two times a day    Glucotrol 10 Mg Tabs (Glipizide) .Marland Kitchen... Take one tablet two times a day.    Cozaar 50 Mg Tabs (Losartan potassium) .Marland Kitchen... Take 1 pill by mouth daily.  Orders: Diabetic Clinic Referral (Diabetic)  Labs Reviewed: Creat: 0.71 (07/28/2009)    Reviewed HgBA1c results: 8.0 (06/25/2009)  7.5 (04/03/2009)  Complete Medication List: 1)  One Daily Womens Tabs (Multiple vitamins-minerals) 2)  Metformin Hcl 1000 Mg Tabs (Metformin hcl) .... Take 1 tablet by mouth two times a day 3)  Nexium 40 Mg Cpdr (Esomeprazole magnesium) .... Take 1 tablet by mouth once a day 4)  Glucotrol 10 Mg Tabs (Glipizide) .... Take one tablet two times a day. 5)  Pravachol 40 Mg Tabs (Pravastatin sodium) .... Take 1 pill by mouth daily. 6)  Ultram 50 Mg Tabs (Tramadol hcl) .... Take 1 tablet by mouth every 8 hours as needed for pain 7)  Cozaar 50 Mg Tabs (Losartan potassium) .... Take 1 pill by mouth daily. 8)  Hydrochlorothiazide 25 Mg Tabs (Hydrochlorothiazide) .... Take one tablet daily for blood pressure. 9)  Vitamin D (ergocalciferol) 50000 Unit Caps (Ergocalciferol) .... Take one tablet weekly for 8 weeks. 10)  Neurontin 300 Mg Caps (Gabapentin) .... Take 1 pill by mouth at bedtime 11)  Cozaar 50 Mg Tabs (Losartan potassium) .... Take 1 pill by mouth daily.  Patient Instructions: 1)  Please get an appointment to see D. Riley 2)  Please schedule a follow-up appointment in 2 weeks. 3)  Limit your Sodium (Salt) to less than 2 grams a day(slightly less than 1/2 a teaspoon) to prevent fluid retention, swelling, or worsening of symptoms. 4)  It is important that  you exercise regularly at least 20 minutes 5 times a week. If you develop chest pain, have severe difficulty breathing, or feel very tired , stop exercising immediately and seek medical attention. 5)  You need to lose weight. Consider a lower calorie diet and regular exercise.  6)  Check your blood sugars regularly. If your readings are usually above : or below 70 you should contact our office. 7)  It is important that your Diabetic A1c level is checked every 3 months. 8)  See your eye doctor yearly to check for diabetic eye damage. 9)  Check your feet each night for sore areas, calluses or signs of infection. 10)  Check your Blood Pressure regularly. If it is above: you should make an appointment. Prescriptions: COZAAR 50 MG TABS (LOSARTAN POTASSIUM) take 1 pill by mouth daily.  #30 x 2   Entered and Authorized by:   Jason Coop MD   Signed by:   Jason Coop MD on 08/06/2009   Method used:   Electronically to        [facility] [address] (retail)       [address]       [place], [place]  [address]       Ph: [phone]       Fax: [phone]   RxID:   [id number]    Prevention & Chronic Care Immunizations   Influenza vaccine: Not documented   Influenza vaccine due: 02/11/2010    Tetanus booster: Not documented    Pneumococcal vaccine: Not documented  Other Screening   Pap smear: Not documented    Mammogram: Not documented   Mammogram due: 06/13/2009   Smoking status:  quit  (08/06/2009)  Diabetes Mellitus   HgbA1C: 8.0  (06/25/2009)    Eye exam: Not documented   Diabetic eye exam action/deferral: Ophthalmology referral  (07/07/2009)    Foot exam: yes  (06/11/2009)   Foot exam action/deferral: Do today   High risk foot: Not documented   Foot care education: Not documented    Urine microalbumin/creatinine ratio: 4.2  (04/03/2009)  Lipids   Total Cholesterol: 192  (07/28/2009)   LDL: 133  (07/28/2009)   LDL Direct: Not  documented   HDL: 34  (07/28/2009)   Triglycerides: 126  (07/28/2009)    SGOT (AST): 37  (07/28/2009)   SGPT (ALT): 51  (07/28/2009)   Alkaline phosphatase: 73  (07/28/2009)   Total bilirubin: 0.3  (07/28/2009)  Hypertension   Last Blood Pressure: 128 / 85  (08/06/2009)   Serum creatinine: 0.71  (07/28/2009)   Serum potassium 3.8  (07/28/2009)  Self-Management Support :   Personal Goals (by the next clinic visit) :     Personal A1C goal: 7  (04/03/2009)     Personal blood pressure goal: 140/90  (04/03/2009)     Personal LDL goal: 70  (04/03/2009)    Patient will work on the following items until the next clinic visit to reach self-care goals:     Medications and monitoring: take my medicines every day, check my blood sugar, bring all of my medications to every visit, examine my feet every day  (08/06/2009)     Eating: drink diet soda or water instead of juice or soda, eat more vegetables, use fresh or frozen vegetables, eat foods that are low in salt, eat baked foods instead of fried foods, eat fruit for snacks and desserts, limit or avoid alcohol  (08/06/2009)     Activity: take a 30 minute walk every day  (08/06/2009)    Diabetes self-management support: Resources for patients handout  (08/06/2009)   Last diabetes self-management training by diabetes educator: 01/08/2009   Referred for diabetes self-mgmt training.    Hypertension self-management support: Resources for patients handout  (08/06/2009)    Lipid self-management support: Resources for patients handout  (08/06/2009)        Resource handout printed.

## 2010-07-15 NOTE — Consult Note (Signed)
Summary: [facility]  [facility]   Imported By: Florinda Marker 07/15/2009 16:57:40  _____________________________________________________________________  External Attachment:    Type:   Image     Comment:   External Document

## 2010-07-21 NOTE — Progress Notes (Signed)
Summary: hi cbg's/ hla  Phone Note Call from Patient   Summary of Call: pt called to say she had back surgery 1/4 and her cbg's have been up since then, wanted more insulin, it was suggested to her that she needs to be seen, offered fri 1/27 appt, refused, she request appt next week, transfer to [facility] for appt. Initial call taken by: Marin Roberts RN,  July 07, 2010 1:47 PM

## 2010-07-23 ENCOUNTER — Ambulatory Visit (INDEPENDENT_AMBULATORY_CARE_PROVIDER_SITE_OTHER): Payer: Medicaid Other | Admitting: Internal Medicine

## 2010-07-23 ENCOUNTER — Encounter: Payer: Self-pay | Admitting: Internal Medicine

## 2010-07-23 ENCOUNTER — Other Ambulatory Visit: Payer: Self-pay | Admitting: Internal Medicine

## 2010-07-23 DIAGNOSIS — IMO0002 Reserved for concepts with insufficient information to code with codable children: Secondary | ICD-10-CM

## 2010-07-23 DIAGNOSIS — M5126 Other intervertebral disc displacement, lumbar region: Secondary | ICD-10-CM

## 2010-07-23 DIAGNOSIS — M5417 Radiculopathy, lumbosacral region: Secondary | ICD-10-CM

## 2010-07-23 DIAGNOSIS — E119 Type 2 diabetes mellitus without complications: Secondary | ICD-10-CM

## 2010-07-23 DIAGNOSIS — I10 Essential (primary) hypertension: Secondary | ICD-10-CM

## 2010-07-23 LAB — GLUCOSE, CAPILLARY: Glucose-Capillary: 189 mg/dL — ABNORMAL HIGH (ref 70–99)

## 2010-07-23 LAB — GLUCOSE, POCT (MANUAL RESULT ENTRY): POC Glucose: 189

## 2010-07-23 MED ORDER — OXYCODONE-ACETAMINOPHEN 5-325 MG PO TABS: 1.0000 | ORAL_TABLET | ORAL | Status: DC | PRN

## 2010-07-23 MED ORDER — SIMVASTATIN 40 MG PO TABS: 40.0000 mg | ORAL_TABLET | Freq: Every day | ORAL | Status: DC

## 2010-07-23 MED ORDER — ESOMEPRAZOLE MAGNESIUM 40 MG PO CPDR: 40.0000 mg | DELAYED_RELEASE_CAPSULE | Freq: Every day | ORAL | Status: DC

## 2010-07-23 MED ORDER — GABAPENTIN 800 MG PO TABS: 800.0000 mg | ORAL_TABLET | Freq: Three times a day (TID) | ORAL | Status: DC

## 2010-07-23 MED ORDER — IRBESARTAN 150 MG PO TABS: 150.0000 mg | ORAL_TABLET | Freq: Every day | ORAL | Status: DC

## 2010-07-23 MED ORDER — METFORMIN HCL ER (MOD) 1000 MG PO TB24: 1000.0000 mg | ORAL_TABLET | Freq: Two times a day (BID) | ORAL | Status: DC

## 2010-07-23 MED ORDER — HYDROCHLOROTHIAZIDE 25 MG PO TABS: 25.0000 mg | ORAL_TABLET | Freq: Every day | ORAL | Status: DC

## 2010-07-23 MED ORDER — INSULIN GLARGINE 100 UNIT/ML ~~LOC~~ SOLN: SUBCUTANEOUS | Status: DC

## 2010-07-23 MED ORDER — CYCLOBENZAPRINE HCL 10 MG PO TABS: 10.0000 mg | ORAL_TABLET | Freq: Three times a day (TID) | ORAL | Status: DC | PRN

## 2010-07-23 MED ORDER — GLIPIZIDE 10 MG PO TABS: 10.0000 mg | ORAL_TABLET | Freq: Every day | ORAL | Status: DC

## 2010-07-23 MED ORDER — INSULIN PEN NEEDLE 31G X 8 MM MISC: Status: DC

## 2010-07-23 NOTE — Patient Instructions (Addendum)
Take all your medications as prescribed. Call the clinic if you have any questions or concerns.

## 2010-07-23 NOTE — Progress Notes (Signed)
  Subjective:    Patient ID: [name], female    DOB: 11-06-1960, 50 y.o.   MRN: [id number]   HPI  Pt is a 50 y/o woman with pmh outlined in the EMR as well as HTN and DM2 here on follow up on her back surgery for herniated lumbar disc in January which was done at [facility]. Today she has no other complaints, has been doing great s/p surgery and pain has been better controlled. She needs most of her prescriptions filled today since she is changing insurance coverage.  For her DM, she's been checking her cbgs at least 2-3 times daily and has been consistently getting values in the high 200s and occasionally 300s while on her current dose of Lantus 40units. She does admit to not doing a great job of monitoring her diet.     Review of Systems  Constitutional: Negative for fever.  Respiratory: Negative for shortness of breath.   Cardiovascular: Negative for chest pain and palpitations.  Genitourinary: Negative for dysuria and frequency.       Objective:   Physical Exam  Constitutional: She is oriented to person, place, and time. She appears well-developed and well-nourished. No distress.  Cardiovascular: Normal rate, regular rhythm and normal heart sounds.   No murmur heard. Pulmonary/Chest: Effort normal and breath sounds normal. No respiratory distress. She has no wheezes. She has no rales.  Abdominal: Soft. Bowel sounds are normal. There is no tenderness.  Musculoskeletal: Normal range of motion. She exhibits no edema.  Neurological: She is alert and oriented to person, place, and time.  Psychiatric: She has a normal mood and affect.          Assessment & Plan:

## 2010-07-26 ENCOUNTER — Telehealth: Payer: Self-pay | Admitting: *Deleted

## 2010-07-26 DIAGNOSIS — Z83518 Family history of other specified eye disorder: Secondary | ICD-10-CM

## 2010-07-26 MED ORDER — INSULIN GLARGINE 100 UNIT/ML ~~LOC~~ SOLN: 45.0000 [IU] | Freq: Every day | SUBCUTANEOUS | Status: DC

## 2010-07-26 MED ORDER — METFORMIN HCL 1000 MG PO TABS: 1000.0000 mg | ORAL_TABLET | Freq: Two times a day (BID) | ORAL | Status: DC

## 2010-07-26 MED ORDER — CARBAMIDE PEROXIDE 6.5 % OT SOLN: 5.0000 [drp] | Freq: Two times a day (BID) | OTIC | Status: DC

## 2010-07-26 NOTE — Telephone Encounter (Signed)
Addended by: Ulyess Mort on: 07/26/2010 02:49 PM   Modules accepted: Orders

## 2010-07-26 NOTE — Telephone Encounter (Signed)
Pt calls and states on vmail that she needs a referral to [facility] that she has a bubble in 1 of her eyes and is concerned because "eye problems run in my  Family" Please advise If referral is to be done please send to your nurse to be completed

## 2010-07-26 NOTE — Telephone Encounter (Signed)
Pharmacy would like to change metformin 1000 mg  To plain ( instead of ER )  glumetza is not covered by medicaid.  Also pt is requesting solostar PENS instead of the vial.

## 2010-07-28 NOTE — Assessment & Plan Note (Signed)
Blood pressure very well controlled. Today 121/84. Electrolytes checked in June 2011 all within normal limits. Continue antihypertensives as outlined below without any dose changes made today.

## 2010-07-28 NOTE — Assessment & Plan Note (Signed)
S/p back surgery at [facility]. No post op complications so far, no residual weakness or worsening pain. Has been following with them, and now plans to follow q3 months. Percocet for pain control. Referral to PT/OT for further rehab.

## 2010-07-28 NOTE — Assessment & Plan Note (Signed)
Not well controlled. A1C 9.5 today. Her elevated blood sugars could have been persistently elevated shortly after surgery 2/2 stress, however she is now a few weeks out. So for today, will increase her Lantus to 45 units qhs with instructions to continue to record her cbgs and bring them in to be reviewed with her PCP at next visit. Continue orals as outlined below with no dose changes.

## 2010-07-29 ENCOUNTER — Encounter: Payer: Self-pay | Admitting: *Deleted

## 2010-08-09 ENCOUNTER — Ambulatory Visit (INDEPENDENT_AMBULATORY_CARE_PROVIDER_SITE_OTHER): Payer: Medicaid Other | Admitting: Internal Medicine

## 2010-08-09 DIAGNOSIS — E119 Type 2 diabetes mellitus without complications: Secondary | ICD-10-CM

## 2010-08-09 DIAGNOSIS — A6000 Herpesviral infection of urogenital system, unspecified: Secondary | ICD-10-CM

## 2010-08-09 DIAGNOSIS — I10 Essential (primary) hypertension: Secondary | ICD-10-CM

## 2010-08-09 LAB — GLUCOSE, CAPILLARY: Glucose-Capillary: 252 mg/dL — ABNORMAL HIGH (ref 70–99)

## 2010-08-09 MED ORDER — VALACYCLOVIR HCL 500 MG PO TABS: 500.0000 mg | ORAL_TABLET | Freq: Two times a day (BID) | ORAL | Status: AC

## 2010-08-09 MED ORDER — INSULIN GLARGINE 100 UNIT/ML ~~LOC~~ SOLN: 50.0000 [IU] | Freq: Every day | SUBCUTANEOUS | Status: DC

## 2010-08-09 NOTE — Patient Instructions (Signed)
Pls continue to take all your medications as prescribed. Inject 50 units of Lantus under the skin every night. Pls continue to check your blood sugars as you are doing now. Watch out for signs of dangerously low blood sugar- sweats, dizziness, lightheadedness. Your blood sugar is too low if it is less than 70. Weight loss will help you feel better. Dieting and Exercise are the best way to lose weight.  Let us know if you have any questions or concerns.

## 2010-08-09 NOTE — Assessment & Plan Note (Addendum)
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  Will refill her Valtrex script today.  May need to discuss this issue in more detail with her PCP at her next visit.

## 2010-08-09 NOTE — Assessment & Plan Note (Addendum)
Poorly controlled with hemoglobin A1c of 9.5.  Also complains of blurry vision which has been a problem for her in the past and this is very likely secondary to poorly controlled diabetes ( diabetic retinopathy most likely  versus cataracts).  For today- -  Increase Lantus to 50 units at bedtime, with instructions to continue to record her blood sugars as she is doing right now and to monitor for signs of hypoglycemia. -  Referral to ophthalmology for diabetic eye exam.

## 2010-08-09 NOTE — Progress Notes (Signed)
  Subjective:    Patient ID: [name], female    DOB: 1961-02-14, 50 y.o.   MRN: [id number]  HPI  Patient is a 50 year old female with history of hypertension, type 2 diabetes and recent back surgery for herniated lumbar disc who is here on follow up of her diabetes.  During her last visit her blood sugars were poorly controlled with a hemoglobin A1c of 9.5.  Her Lantus was subsequently increased to 45 units nightly.  Since then she's been checking her blood sugars and both fasting and postprandial values have ranged between the mid 200s to 300s.  She also complains of persistent but intermittent blurry vision.  Otherwise she has no other complaints today and stating that she's mentally ready to begin the weight loss process and like to try dieting and exercise. She denies any progressive back pain and is scheduled to start physical therapy tomorrow.  Review of Systems  Constitutional: Negative for chills.  Respiratory: Negative for shortness of breath.   Cardiovascular: Negative for palpitations.  Gastrointestinal: Negative for nausea and vomiting.       Objective:   Physical Exam  Constitutional: She is oriented to person, place, and time. She appears well-developed and well-nourished. No distress.  Cardiovascular: Normal rate, regular rhythm and normal heart sounds.  Exam reveals no gallop and no friction rub.   No murmur heard. Pulmonary/Chest: Effort normal and breath sounds normal. No respiratory distress. She has no wheezes. She has no rales.  Abdominal: Soft. Bowel sounds are normal. There is no tenderness.  Musculoskeletal: Normal range of motion.  Neurological: She is alert and oriented to person, place, and time.  Psychiatric: She has a normal mood and affect.          Assessment & Plan:

## 2010-08-09 NOTE — Assessment & Plan Note (Addendum)
The patient was counseled about weight loss strategies that include dieting and exercising.

## 2010-08-09 NOTE — Assessment & Plan Note (Addendum)
Blood pressure very well controlled at 124/83 today.  Currently on a regimen of hydrochlorothiazide 25 mg and Avapro 150 mg.  Last basic metabolic panel checked in June 2011 and within normal limits with a creatinine of 0.6.  No changes to her antihypertensive regimen today. Will continue to monitor.

## 2010-08-10 ENCOUNTER — Ambulatory Visit: Payer: Medicaid Other | Attending: Internal Medicine | Admitting: Physical Therapy

## 2010-08-10 DIAGNOSIS — IMO0001 Reserved for inherently not codable concepts without codable children: Secondary | ICD-10-CM | POA: Insufficient documentation

## 2010-08-10 DIAGNOSIS — M6281 Muscle weakness (generalized): Secondary | ICD-10-CM | POA: Insufficient documentation

## 2010-08-10 DIAGNOSIS — M2550 Pain in unspecified joint: Secondary | ICD-10-CM | POA: Insufficient documentation

## 2010-08-10 DIAGNOSIS — R262 Difficulty in walking, not elsewhere classified: Secondary | ICD-10-CM | POA: Insufficient documentation

## 2010-08-17 ENCOUNTER — Ambulatory Visit: Payer: Medicaid Other | Attending: Internal Medicine | Admitting: Physical Therapy

## 2010-08-17 DIAGNOSIS — R262 Difficulty in walking, not elsewhere classified: Secondary | ICD-10-CM | POA: Insufficient documentation

## 2010-08-17 DIAGNOSIS — IMO0001 Reserved for inherently not codable concepts without codable children: Secondary | ICD-10-CM | POA: Insufficient documentation

## 2010-08-17 DIAGNOSIS — M6281 Muscle weakness (generalized): Secondary | ICD-10-CM | POA: Insufficient documentation

## 2010-08-17 DIAGNOSIS — M2550 Pain in unspecified joint: Secondary | ICD-10-CM | POA: Insufficient documentation

## 2010-08-24 ENCOUNTER — Ambulatory Visit: Payer: Medicaid Other | Admitting: Physical Therapy

## 2010-08-25 LAB — GLUCOSE, CAPILLARY: Glucose-Capillary: 156 mg/dL — ABNORMAL HIGH (ref 70–99)

## 2010-08-26 LAB — POCT I-STAT, CHEM 8
BUN: 3 mg/dL — ABNORMAL LOW (ref 6–23)
Calcium, Ion: 1.07 mmol/L — ABNORMAL LOW (ref 1.12–1.32)
Creatinine, Ser: 0.6 mg/dL (ref 0.4–1.2)
Glucose, Bld: 201 mg/dL — ABNORMAL HIGH (ref 70–99)
Sodium: 138 mEq/L (ref 135–145)
TCO2: 29 mmol/L (ref 0–100)

## 2010-08-26 LAB — CBC
HCT: 41.4 % (ref 36.0–46.0)
Hemoglobin: 13.7 g/dL (ref 12.0–15.0)
MCH: 27 pg (ref 26.0–34.0)
MCV: 81.7 fL (ref 78.0–100.0)
RBC: 5.07 MIL/uL (ref 3.87–5.11)
WBC: 7.9 10*3/uL (ref 4.0–10.5)

## 2010-08-26 LAB — POCT CARDIAC MARKERS
CKMB, poc: <1 ng/mL — ABNORMAL LOW (ref 1.0–8.0)
Myoglobin, poc: 66.9 ng/mL (ref 12–200)
Myoglobin, poc: 76.1 ng/mL (ref 12–200)

## 2010-08-26 LAB — DIFFERENTIAL
Eosinophils Relative: 2 % (ref 0–5)
Lymphocytes Relative: 42 % (ref 12–46)
Lymphs Abs: 3.3 10*3/uL (ref 0.7–4.0)
Monocytes Absolute: 0.5 10*3/uL (ref 0.1–1.0)
Monocytes Relative: 7 % (ref 3–12)

## 2010-08-26 LAB — GLUCOSE, CAPILLARY: Glucose-Capillary: 199 mg/dL — ABNORMAL HIGH (ref 70–99)

## 2010-08-27 LAB — GLUCOSE, CAPILLARY: Glucose-Capillary: 198 mg/dL — ABNORMAL HIGH (ref 70–99)

## 2010-08-30 LAB — CBC
Hemoglobin: 14.2 g/dL (ref 12.0–15.0)
MCHC: 33.3 g/dL (ref 30.0–36.0)
MCV: 82.2 fL (ref 78.0–100.0)
RBC: 5.2 MIL/uL — ABNORMAL HIGH (ref 3.87–5.11)

## 2010-08-30 LAB — BASIC METABOLIC PANEL
CO2: 26 mEq/L (ref 19–32)
Chloride: 102 mEq/L (ref 96–112)
Creatinine, Ser: 0.56 mg/dL (ref 0.4–1.2)
GFR calc Af Amer: >60 mL/min (ref >60)
Sodium: 138 mEq/L (ref 135–145)

## 2010-08-30 LAB — GLUCOSE, CAPILLARY
Glucose-Capillary: 159 mg/dL — ABNORMAL HIGH (ref 70–99)
Glucose-Capillary: 223 mg/dL — ABNORMAL HIGH (ref 70–99)

## 2010-09-01 ENCOUNTER — Encounter: Payer: Medicaid Other | Admitting: Rehabilitation

## 2010-09-03 LAB — POCT I-STAT, CHEM 8
Glucose, Bld: 177 mg/dL — ABNORMAL HIGH (ref 70–99)
HCT: 50 % — ABNORMAL HIGH (ref 36.0–46.0)
Hemoglobin: 17 g/dL — ABNORMAL HIGH (ref 12.0–15.0)
Potassium: 3.1 mEq/L — ABNORMAL LOW (ref 3.5–5.1)
Sodium: 136 mEq/L (ref 135–145)

## 2010-09-03 LAB — CBC
MCHC: 33.2 g/dL (ref 30.0–36.0)
MCV: 82.3 fL (ref 78.0–100.0)
Platelets: 242 10*3/uL (ref 150–400)

## 2010-09-03 LAB — DIFFERENTIAL
Basophils Relative: 1 % (ref 0–1)
Eosinophils Absolute: 0.1 10*3/uL (ref 0.0–0.7)
Neutrophils Relative %: 44 % (ref 43–77)

## 2010-09-03 LAB — STREP A DNA PROBE: Group A Strep Probe: NEGATIVE

## 2010-09-09 ENCOUNTER — Ambulatory Visit: Payer: Medicaid Other | Admitting: Physical Therapy

## 2010-09-10 ENCOUNTER — Other Ambulatory Visit: Payer: Self-pay | Admitting: *Deleted

## 2010-09-10 ENCOUNTER — Other Ambulatory Visit: Payer: Self-pay | Admitting: Internal Medicine

## 2010-09-10 MED ORDER — OXYCODONE-ACETAMINOPHEN 5-325 MG PO TABS: 1.0000 | ORAL_TABLET | ORAL | Status: DC | PRN

## 2010-09-10 MED ORDER — CYCLOBENZAPRINE HCL 10 MG PO TABS: 10.0000 mg | ORAL_TABLET | Freq: Three times a day (TID) | ORAL | Status: DC | PRN

## 2010-09-10 NOTE — Telephone Encounter (Signed)
Pt informed, script for flexeril called to [facility]

## 2010-09-13 LAB — GLUCOSE, CAPILLARY: Glucose-Capillary: 179 mg/dL — ABNORMAL HIGH (ref 70–99)

## 2010-09-15 LAB — GLUCOSE, CAPILLARY
Glucose-Capillary: 139 mg/dL — ABNORMAL HIGH (ref 70–99)
Glucose-Capillary: 158 mg/dL — ABNORMAL HIGH (ref 70–99)
Glucose-Capillary: 170 mg/dL — ABNORMAL HIGH (ref 70–99)
Glucose-Capillary: 176 mg/dL — ABNORMAL HIGH (ref 70–99)
Glucose-Capillary: 196 mg/dL — ABNORMAL HIGH (ref 70–99)
Glucose-Capillary: 249 mg/dL — ABNORMAL HIGH (ref 70–99)
Glucose-Capillary: 251 mg/dL — ABNORMAL HIGH (ref 70–99)

## 2010-09-15 LAB — CBC
Hemoglobin: 12.1 g/dL (ref 12.0–15.0)
Hemoglobin: 14.3 g/dL (ref 12.0–15.0)
MCHC: 33.8 g/dL (ref 30.0–36.0)
MCHC: 34.2 g/dL (ref 30.0–36.0)
MCV: 84.8 fL (ref 78.0–100.0)
Platelets: 227 10*3/uL (ref 150–400)
RBC: 4.19 MIL/uL (ref 3.87–5.11)
RBC: 5.01 MIL/uL (ref 3.87–5.11)
RDW: 14.4 % (ref 11.5–15.5)
RDW: 14.4 % (ref 11.5–15.5)
WBC: 5.4 10*3/uL (ref 4.0–10.5)

## 2010-09-15 LAB — HIV ANTIBODY (ROUTINE TESTING W REFLEX): HIV: NONREACTIVE

## 2010-09-15 LAB — COMPREHENSIVE METABOLIC PANEL
ALT: 90 U/L — ABNORMAL HIGH (ref 0–35)
AST: 83 U/L — ABNORMAL HIGH (ref 0–37)
Albumin: 3.8 g/dL (ref 3.5–5.2)
Calcium: 9.5 mg/dL (ref 8.4–10.5)
Creatinine, Ser: 0.74 mg/dL (ref 0.4–1.2)
GFR calc Af Amer: >60 mL/min (ref >60)
GFR calc non Af Amer: >60 mL/min (ref >60)
Sodium: 138 mEq/L (ref 135–145)
Total Protein: 7.7 g/dL (ref 6.0–8.3)

## 2010-09-15 LAB — HEPATIC FUNCTION PANEL
Alkaline Phosphatase: 44 U/L (ref 39–117)
Total Bilirubin: 0.4 mg/dL (ref 0.3–1.2)
Total Protein: 6.6 g/dL (ref 6.0–8.3)

## 2010-09-15 LAB — BASIC METABOLIC PANEL
BUN: 5 mg/dL — ABNORMAL LOW (ref 6–23)
Calcium: 8.8 mg/dL (ref 8.4–10.5)
GFR calc Af Amer: >60 mL/min (ref >60)
GFR calc non Af Amer: >60 mL/min (ref >60)
GFR calc non Af Amer: >60 mL/min (ref >60)
Potassium: 3.1 mEq/L — ABNORMAL LOW (ref 3.5–5.1)
Sodium: 136 mEq/L (ref 135–145)
Sodium: 140 mEq/L (ref 135–145)

## 2010-09-15 LAB — CARDIAC PANEL(CRET KIN+CKTOT+MB+TROPI)
CK, MB: 0.8 ng/mL (ref 0.3–4.0)
Total CK: 139 U/L (ref 7–177)
Troponin I: 0.01 ng/mL (ref 0.00–0.06)

## 2010-09-15 LAB — POCT URINALYSIS DIP (DEVICE)
Glucose, UA: >=1000 mg/dL — AB
Ketones, ur: 15 mg/dL — AB
pH: 5.5 (ref 5.0–8.0)

## 2010-09-15 LAB — DIFFERENTIAL
Basophils Absolute: 0 10*3/uL (ref 0.0–0.1)
Eosinophils Absolute: 0.1 10*3/uL (ref 0.0–0.7)
Eosinophils Relative: 2 % (ref 0–5)
Lymphocytes Relative: 31 % (ref 12–46)
Lymphocytes Relative: 44 % (ref 12–46)
Lymphs Abs: 2.1 10*3/uL (ref 0.7–4.0)
Monocytes Absolute: 0.4 10*3/uL (ref 0.1–1.0)
Monocytes Relative: 10 % (ref 3–12)
Monocytes Relative: 6 % (ref 3–12)
Neutro Abs: 4.6 10*3/uL (ref 1.7–7.7)
Neutrophils Relative %: 62 % (ref 43–77)

## 2010-09-15 LAB — POCT I-STAT, CHEM 8
Creatinine, Ser: 0.7 mg/dL (ref 0.4–1.2)
Glucose, Bld: 222 mg/dL — ABNORMAL HIGH (ref 70–99)
HCT: 46 % (ref 36.0–46.0)
Hemoglobin: 15.6 g/dL — ABNORMAL HIGH (ref 12.0–15.0)
Potassium: 3.6 mEq/L (ref 3.5–5.1)
Sodium: 137 mEq/L (ref 135–145)
TCO2: 24 mmol/L (ref 0–100)

## 2010-09-15 LAB — POCT H PYLORI SCREEN: H. PYLORI SCREEN, POC: NEGATIVE

## 2010-09-15 LAB — LIPID PANEL
Cholesterol: 197 mg/dL (ref 0–200)
Triglycerides: 129 mg/dL (ref <150)

## 2010-09-15 LAB — HEMOCCULT GUIAC POC 1CARD (OFFICE): Fecal Occult Bld: NEGATIVE

## 2010-09-15 LAB — TSH: TSH: 2.097 u[IU]/mL (ref 0.350–4.500)

## 2010-09-24 ENCOUNTER — Ambulatory Visit (INDEPENDENT_AMBULATORY_CARE_PROVIDER_SITE_OTHER): Payer: Medicaid Other | Admitting: Internal Medicine

## 2010-09-24 VITALS — BP 126/87 | HR 84 | Temp 97.6°F | Wt 247.6 lb

## 2010-09-24 DIAGNOSIS — M5126 Other intervertebral disc displacement, lumbar region: Secondary | ICD-10-CM

## 2010-09-24 DIAGNOSIS — I10 Essential (primary) hypertension: Secondary | ICD-10-CM

## 2010-09-24 DIAGNOSIS — E119 Type 2 diabetes mellitus without complications: Secondary | ICD-10-CM

## 2010-09-24 DIAGNOSIS — R1013 Epigastric pain: Secondary | ICD-10-CM

## 2010-09-24 LAB — COMPREHENSIVE METABOLIC PANEL
AST: 53 U/L — ABNORMAL HIGH (ref 0–37)
Albumin: 4.2 g/dL (ref 3.5–5.2)
Alkaline Phosphatase: 82 U/L (ref 39–117)
BUN: 9 mg/dL (ref 6–23)
Potassium: 3.3 mEq/L — ABNORMAL LOW (ref 3.5–5.3)

## 2010-09-24 LAB — CBC WITH DIFFERENTIAL/PLATELET
Eosinophils Absolute: 0.2 10*3/uL (ref 0.0–0.7)
Eosinophils Relative: 2 % (ref 0–5)
HCT: 40.4 % (ref 36.0–46.0)
Hemoglobin: 12.5 g/dL (ref 12.0–15.0)
Lymphs Abs: 3.6 10*3/uL (ref 0.7–4.0)
MCH: 24.4 pg — ABNORMAL LOW (ref 26.0–34.0)
MCV: 78.9 fL (ref 78.0–100.0)
Monocytes Absolute: 0.4 10*3/uL (ref 0.1–1.0)
Monocytes Relative: 6 % (ref 3–12)
RBC: 5.12 MIL/uL — ABNORMAL HIGH (ref 3.87–5.11)

## 2010-09-24 MED ORDER — OXYCODONE-ACETAMINOPHEN 5-325 MG PO TABS: 1.0000 | ORAL_TABLET | ORAL | Status: DC | PRN

## 2010-09-24 NOTE — Patient Instructions (Signed)
Check your blood sugars every day.

## 2010-09-25 ENCOUNTER — Encounter: Payer: Self-pay | Admitting: Internal Medicine

## 2010-09-25 NOTE — Progress Notes (Signed)
Subjective:    Patient ID: [name], female    DOB: [birth year], 50 y.o.   MRN: [id number]  HPI 50yo W with PMH of severe lumbosacral radiculopathy s/p surgery in 05/2010, DM2, HTN who presents for follow-up/evaluation of: 1. Epigastric pain/discomfort. She reports that 2 weeks ago, she developed significant epigastric pain and tenderness after eating a meal of pizza. This pain was also associated with nausea but no vomiting. Pain was neither exacerbated nor alleviated by food. These symptoms persisted for several days but then resolved. Then, a few days ago, the discomfort and nausea returned. She also reports frequent belching and feeling like her stomach takes "hours" to digest. She denies fever/chills, diarrhea, constipation, or any other symptoms. She reports taking her Nexium daily and does not take NSAIDs with regularity.  2. Low back pain. Her pain and mobility is greatly improved following neurosurgery at [facility] in December. She still has pain for which she takes gabapentin TID and percocet tid. She is trying to use Percocet less as her pain improves and she would ultimately like to come off of it. She also takes Flexeril occasionally (a couple of times per week) for muscle cramps. Overall, she is quite happy with her recovery and improvement, as she is now able to walk good distances and function in her daily life without being limited by pain. 3. DM2. Most recent Hb A1C was 9.5 in February. Lantus insulin has since been increased to 50units QHS. She forgot her meter today but reports that CBGs have been better, 100s-200s. She denies any low or symptomatic blood sugars.   Current Outpatient Prescriptions on File Prior to Visit  Medication Sig Dispense Refill  . celecoxib (CELEBREX) 200 MG capsule Take 200 mg by mouth 2 (two) times daily.        . cyclobenzaprine (FLEXERIL) 10 MG tablet Take 1 tablet (10 mg total) by mouth 3 (three) times daily as needed for muscle spasms.  30 tablet  1  .  esomeprazole (NEXIUM) 40 MG capsule Take 1 capsule (40 mg total) by mouth daily.  30 capsule  6  . gabapentin (NEURONTIN) 800 MG tablet Take 1 tablet (800 mg total) by mouth 3 (three) times daily.  90 tablet  3  . glipiZIDE (GLUCOTROL) 10 MG tablet Take 1 tablet (10 mg total) by mouth daily.  60 tablet  3  . hydrochlorothiazide 25 MG tablet Take 1 tablet (25 mg total) by mouth daily.  30 tablet  6  . insulin glargine (LANTUS) 100 UNIT/ML injection Inject 50 Units into the skin at bedtime.  13.5 mL  11  . Insulin Pen Needle (UNIFINE PENTIPS) 31G X 8 MM MISC Use as directed  1 each  11  . irbesartan (AVAPRO) 150 MG tablet Take 1 tablet (150 mg total) by mouth at bedtime.  60 tablet  3  . metFORMIN (GLUCOPHAGE) 1000 MG tablet Take 1 tablet (1,000 mg total) by mouth 2 (two) times daily with meals.  60 tablet  11  . simvastatin (ZOCOR) 40 MG tablet Take 1 tablet (40 mg total) by mouth daily.  60 tablet  3  . carbamide peroxide (DEBROX) 6.5 % otic solution Place 5 drops into both ears 2 (two) times daily.  15 mL  2    Past Medical History  Diagnosis Date  . Chronic back pain   . Hyperlipidemia   . Diabetes   . Hypertension     Review of Systems  Constitutional: Negative for fever, chills, diaphoresis  and fatigue.  HENT: Negative for congestion and sore throat.   Eyes: Negative for visual disturbance.  Respiratory: Negative for cough, choking, chest tightness, shortness of breath and wheezing.   Cardiovascular: Negative for chest pain and palpitations.  Gastrointestinal: Positive for nausea and abdominal pain. Negative for vomiting, diarrhea, constipation, blood in stool, abdominal distention and rectal pain.  Genitourinary: Negative for dysuria and flank pain.  Musculoskeletal: Positive for back pain.  Skin: Negative for rash.  Neurological: Positive for headaches. Negative for dizziness, weakness, light-headedness and numbness.  Psychiatric/Behavioral: Negative.        Objective:    Physical Exam  Constitutional: She is oriented to person, place, and time. No distress.       obese  HENT:  Head: Normocephalic and atraumatic.  Mouth/Throat: Oropharynx is clear and moist. No oropharyngeal exudate.  Eyes: Conjunctivae and EOM are normal. Pupils are equal, round, and reactive to light. No scleral icterus.  Neck: Normal range of motion. Neck supple. No tracheal deviation present. No thyromegaly present.  Cardiovascular: Normal rate, regular rhythm and normal heart sounds.  Exam reveals no gallop and no friction rub.   No murmur heard. Pulmonary/Chest: Breath sounds normal. No respiratory distress. She has no wheezes. She has no rales. She exhibits no tenderness.  Abdominal: Soft. Bowel sounds are normal. She exhibits no distension and no mass. There is tenderness. There is no rebound and no guarding.       Mildly tender in epigastric area. No RUQ tenderness, neg Murphy's sign.   Musculoskeletal: Normal range of motion. She exhibits no edema.  Neurological: She is alert and oriented to person, place, and time. No cranial nerve deficit.  Skin: Skin is warm and dry. No rash noted.  Psychiatric: She has a normal mood and affect. Her behavior is normal. Judgment and thought content normal.          Assessment & Plan:

## 2010-09-25 NOTE — Assessment & Plan Note (Signed)
Patient's DM2 has not been adequately controlled, as demonstrated by A1C of 9.5 in February. However, given that her Lantus has since been increased to 50units and she does not have her meter today, I am not sure that I can safely adjust it further. Patient reports improved serum glucose in the 100s-200s. Have asked her to bring her meter to her next visit, at which time we will also check A1C again and adjust insulin accordingly.

## 2010-09-25 NOTE — Assessment & Plan Note (Signed)
Patient's symptoms of epigastric pain/tenderness and nausea are concerning for possible gastritis, although patient has been taking Nexium 40mg  daily and denies heavy EtOH or NSAID use. Given her long history of poorly controlled DM2 and the feeling that food is still in her stomach hours later, I also suspect that she may have gastroparesis, which is contributing to her nausea and discomfort. Will refer for gastric emptying study to evaluate for gastroparesis. Other etiologies, such as a biliary process seem less likely, although I will check CMET, CBC, lipase to evaluate for such etiologies. Patient is encouraged to continue taking Nexium. If discomfort continues, may consider adding H2 blocker.

## 2010-09-25 NOTE — Assessment & Plan Note (Signed)
Pain and mobility have improved following surgery. Patient agrees to decrease Percocet dispensed to 90 tablets/month instead of 120. Will try to see if she can taper off of it further.

## 2010-09-25 NOTE — Assessment & Plan Note (Signed)
Well controlled  - Continue current management

## 2010-10-04 ENCOUNTER — Ambulatory Visit (INDEPENDENT_AMBULATORY_CARE_PROVIDER_SITE_OTHER): Payer: Medicaid Other | Admitting: *Deleted

## 2010-10-04 ENCOUNTER — Ambulatory Visit: Payer: Medicaid Other

## 2010-10-04 DIAGNOSIS — Z111 Encounter for screening for respiratory tuberculosis: Secondary | ICD-10-CM

## 2010-10-07 ENCOUNTER — Encounter (HOSPITAL_COMMUNITY)
Admission: RE | Admit: 2010-10-07 | Discharge: 2010-10-07 | Disposition: A | Discharge status: Home / Self Care | Payer: Medicaid Other | Source: Ambulatory Visit | Attending: Internal Medicine | Admitting: Internal Medicine

## 2010-10-07 DIAGNOSIS — R1013 Epigastric pain: Secondary | ICD-10-CM | POA: Insufficient documentation

## 2010-10-07 MED ORDER — TECHNETIUM TC 99M SULFUR COLLOID
2.0000 | Freq: Once | INTRAVENOUS | Status: AC | PRN
  Administered 2010-10-07: 2 via INTRAVENOUS

## 2010-10-15 ENCOUNTER — Telehealth: Payer: Self-pay | Admitting: Internal Medicine

## 2010-10-15 MED ORDER — RANITIDINE HCL 150 MG PO TABS: 150.0000 mg | ORAL_TABLET | Freq: Two times a day (BID) | ORAL | Status: DC

## 2010-10-15 NOTE — Telephone Encounter (Signed)
Called patient to discuss results of recent gastric emptying study, which was normal. Patient continues to complain of stomach discomfort that lasts for days. Will try adding an H2 blocker. If symptoms persist, she may need GI referral.

## 2010-10-26 NOTE — Op Note (Signed)
NAMESIANI, UTKE                  ACCOUNT NO.:  [id number]   MEDICAL RECORD NO.:  [id number]          PATIENT TYPE:  INP   LOCATION:  9320                          FACILITY:  [facility]   PHYSICIAN:  Osborn Coho, M.D.   DATE OF BIRTH:  [birth year]   DATE OF PROCEDURE:  05/24/2007  DATE OF DISCHARGE:  05/27/2007                               OPERATIVE REPORT   PREOPERATIVE DIAGNOSIS:  Symptomatic fibroids.   POSTOPERATIVE DIAGNOSIS:  Symptomatic fibroids.   PROCEDURES:  1. Total abdominal hysterectomy.  2. Cystoscopy.   ATTENDING:  Osborn Coho, M.D.   ASSISTANT:  Naima A. Normand Sloop, M.D.   ANESTHESIA:  Spinal and epidural.   SPECIMENS TO PATHOLOGY:  Uterus and cervix weighing 753 grams.   FLUIDS:  2150 mL.   URINE OUTPUT:  550 mL.   ESTIMATED BLOOD LOSS:  250 mL.   COMPLICATIONS:  None.   PROCEDURE:  The patient was taken to the operating room after the risks,  benefits and alternatives were reviewed with the patient.  The patient  verbalized understanding and consent was signed and witnessed.  The  patient was placed under general endotracheal anesthesia; and prepped  and draped in the normal sterile fashion in the supine position.  A  Pfannenstiel skin incision was made and carried down to the underlying  layer of fascia with the scalpel.  The fascia was excised bilaterally in  the midline with the Bovie, and extended bilaterally with the Mayo  scissors.  Kocher clamps were placed on the superior aspect of fascial  incision, and the rectus muscle excised from the fascia.  The same was  done on the inferior aspect of the fascial incision.  The rectus muscle  was separated in the midline and the peritoneum entered bluntly and  extended manually.  The uterus was identified and laparotomy sponges  were placed to pack away the bowel.  A Balfour self-retaining retractor  was placed.  A stitch was placed in the uterine fundus in order to help  elevate the uterus.  The  patient's left round ligament was then clamped  with a Kelly; suture ligated with 0 Vicryl x2, and excised with the  Bovie.  The bladder flap was created.  The same was done on the  contralateral side.  The utero-ovarian on the patient's left was then  isolated and clamped with 2 large Kelly's.  They were then excised and  the ovarian pedicle was ligated with 0 Vicryl and suture ligated as well  with 0 Vicryl.  The uterine pedicle was ligated with 0 Vicryl.  The same  was done on the contralateral side.  The bilateral uterine vessels were  skeletonized and clamped with a Heaney clamp; cut and suture ligated  with 0 Vicryl.  The paracervical tissue incorporating the uterosacral  and cardinal ligaments was then clamped with a straight Heaney clamp;  cut and suture ligated using 0 Vicryl.  This was done after dissecting  away the bladder from the cervix.  This was done down to the level of  the external os, which was then clamped  with a curved Heaney  bilaterally; cut and suture ligated using a fixation stitch of 0 Vicryl.  The uterus and cervix were excised and sent off to pathology.  The  remaining vaginal cuff was repaired with 0 Vicryl via figure-of-eight  stitches.  The intra-abdominal cavity was copiously irrigated.  All  instruments were removed and the peritoneum was repaired with 2-0  chromic in a running fashion.  The fascia was repaired with 0 Vicryl in  a running fashion.  The subcutaneous tissue was irrigated and made  hemostatic with the Bovie.  Three interrupted stitches of 2-0 plain were  placed in the subcutaneous tissue.  The skin was reapproximated using 3-  0 Monocryl via a subcuticular stitch.   Attention was then turned to the perineum, where cystoscopy was  performed after prepping with betadine and administering IV indigo  carmine; bilateral ureters were noted to efflux without difficulty.  Sponge, lap and needle count was correct.  The patient tolerated the   procedure well and was returned to the recovery room in good condition.      Osborn Coho, M.D.  Electronically Signed     AR/MEDQ  D:  06/09/2007  T:  06/10/2007  Job:  [id number]

## 2010-10-26 NOTE — Op Note (Signed)
NAMEPORSHA, SKILTON                  ACCOUNT NO.:  [id number]   MEDICAL RECORD NO.:  [id number]          PATIENT TYPE:  AMB   LOCATION:  ENDO                         FACILITY:  [facility]   PHYSICIAN:  Anselmo Rod, M.D.  DATE OF BIRTH:  [birth year]   DATE OF PROCEDURE:  12/18/2006  DATE OF DISCHARGE:                               OPERATIVE REPORT   PROCEDURE PERFORMED:  Colonoscopy with snare polypectomy X 1.    Anselmo Rod, M.D.   INSTRUMENT USED:  Pentax video colonoscope.   INDICATION FOR PROCEDURE:  A 50 year old African-American female with a  history of rectal bleeding and iron deficiency anemia with diarrhea,  rule out colitis, masses, polyps, etc.   PREPROCEDURE PREPARATION:  Informed consent was procured from the  patient. The patient was fasted for 8 hours prior to the procedure and  prepped with a bottle of magnesium citrate and a gallon of NuLYTELY the  night prior to the procedure.  Risks and benefits of the procedure,  including a 10% miss rate of cancer and polyp, were discussed with the  patient as well.   PREPROCEDURE PHYSICAL:  Patient has stable vital signs.  NECK:  Supple.  CHEST:  Clear to auscultation, S1, S2 regular.  ABDOMEN:  Obese, nontender with normal bowel sounds.   DESCRIPTION OF THE PROCEDURE:  The patient was placed in the left  lateral decubitus position, sedated with an additional 25 mcg of  fentanyl and 5 mg of Versed given intravenously in slow incremental  doses. Once the patient was adequately sedated and maintained on low  flow oxygen and continuous cardiac monitoring, the Pentax video  colonoscope was advanced from the rectum to the mid right colon with  difficulty. There was solid stool in the rectum and rectosigmoid colon.  Multiple washings were done.  There was a large amount of solid stool in  the cecum and the right colon visualized which was limited.  A small  sessile polyp was removed via hot snare from the rectosigmoid  colon.  Early diverticula was noted in the right colon.  The right colon, cecum  and terminal ileum were not visualized because of a poor prep.  No other  diverticula were noted in the left colon.  Retroflexion of rectum  revealed no abnormalities. The patient tolerated the procedure well  without immediate complications.   IMPRESSION:  1. Small sessile polyp removed via hot snare from the rectosigmoid      colon.  2. An isolated early diverticulum in the right colon.  3. A large amount of residual stool in the cecum, right colon and      rectosigmoid colon.   RECOMMENDATIONS:  1. Await pathology results.  2. Avoid all nonsteroidals for the next 4 weeks.  3. Reprep and redo a colonoscopy within the next 2-3 weeks.  4. Outpatient followup 2 weeks after the repeat colonoscopy has been      done.      Anselmo Rod, M.D.  Electronically Signed     JNM/MEDQ  D:  12/18/2006  T:  12/18/2006  Job:  [id number]   cc:   Candyce Churn. Allyne Gee, M.D.  Fax: [phone]

## 2010-10-26 NOTE — H&P (Signed)
NAMESHAROLYN, [name]                  ACCOUNT NO.:  [id number]   MEDICAL RECORD NO.:  [id number]          PATIENT TYPE:  AMB   LOCATION:  SDC                           FACILITY:  [facility]   PHYSICIAN:  Osborn Coho, M.D.   DATE OF BIRTH:  03-Nov-1960   DATE OF ADMISSION:  DATE OF DISCHARGE:                              HISTORY & PHYSICAL   HISTORY OF PRESENT ILLNESS:  [name] is a 50 year old African-American  female, para 3-0-0-3, who presents for total abdominal hysterectomy  because of symptomatic uterine fibroids, dysmenorrhea and menorrhagia.  Over the past year, the patient reports increasingly severe dysmenorrhea  which occurs prior to her 5 to 7-day menstrual flow.  During the  patient's menstrual flow, she only changes her pads approximately five  times daily.  However, her pain may increase from an 8 to a 10/10 on a  10-point pain scale.  The patient is able to achieve some relief (7/10  on 10-point pain scale) with over-the-counter analgesia.  A uterine  ultrasound performed in May 2008 showed a uterus measuring 16.21 x 10.76  x 9.0 cm with posterior fibroids measuring 5.86 and 5.64 cm,  respectively, an anterior fibroid measuring 3.41 cm and normal-appearing  ovaries.  The patient denies any nausea, vomiting, diarrhea, any fever,  vaginitis symptoms or urinary tract symptoms.  The patient was offered  Lupron Depot during the course of managing her dysmenorrhea.  However,  she declined.  A review of both medical and surgical management options  were given to the patient; however, she wishes to proceed, due to  discomfort, with definitive therapy in the form of hysterectomy.   PAST MEDICAL HISTORY:   OB HISTORY:  Gravida 3, para 3-0-0-3.   GYN HISTORY:  Menarche 50 years old, last menstrual period April 14, 2007.  She uses bilateral tubal ligation as her method of contraception.  Denies any history of sexually transmitted diseases.  The patient  underwent cryotherapy in  1998 for an abnormal Pap smear.  However,  subsequent Pap smears have been normal.  Last normal Pap smear was  December 2007   MEDICAL HISTORY:  1. Asthma.  2. Hypertension.  3. Migraines.  4. Anemia.  5. Hypercholesterolemia.  6. Diabetes mellitus,  7. Obstructive sleep apnea.   SURGICAL HISTORY:  1. 1981, breast reduction augmentation.  2. 1993, bilateral tubal ligation.   She denies a history of blood transfusions or problems with anesthesia.   FAMILY HISTORY:  Cardiovascular disease, asthma, thyroid disease,  kidney, brain and endometrial cancers, hypertension, stroke.   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   HABITS:  She occasionally consumes alcohol.  Does not use tobacco.   CURRENT MEDICATIONS:  1. Lipitor 40 mg daily.  2. Benicar/hydrochlorothiazide 20/12.5 mg every morning.  3. Zetia 10 mg at bedtime.  4. Janumet 10/998 one tablet twice daily.  5. Protonix 40 mg daily.  6. Coreg CR 20 mg at bedtime.  7. One Source multivitamin daily.  8. Fish oil Omega 3  EFA 1 tablet daily.  9. Vitamin D 5000 international units every Tuesday and Friday.  10.Triamcinolone cream 0.1% topically twice daily.   ALLERGIES:  She has no known drug allergies.   REVIEW OF SYSTEMS:  The patient has pedal edema.  She has chest pain for  which the patient has received cardiac clearance.  She denies any  shortness of breath, any headache, vision changes, nausea, vomiting,  diarrhea, weight loss, skin rashes and except as is mentioned in History  of Present Illness , the patient's Review of Systems is negative.   PHYSICAL EXAMINATION:  VITAL SIGNS:  Blood pressure 150/60.  Weight is  258.  Height is 5 feet 3 inches tall.  PELVIC:  EG- BUS is within normal limits. Vagina is normal.  Cervix is  nontender without lesions.  Uterus is approximately 18-week size,  nontender.  Adnexa:  No masses or  tenderness.   IMPRESSION:  1. Symptomatic uterine fibroids.  2. Dysmenorrhea.   DISPOSITION:  A discussion was held with the patient regarding the  indications for her procedure along with its risks which include but are  not limited to reaction to anesthesia, damage to adjacent organs,  infection, excessive bleeding, and the risk of premature ovarian  failure.  The patient has consented to proceed with a total abdominal  hysterectomy and [facility] [place] on May 24, 2007, at  9 o'clock a.m.     [name].      Osborn Coho, M.D.  Electronically Signed   EJP/MEDQ  D:  05/22/2007  T:  05/22/2007  Job:  [id number]

## 2010-10-26 NOTE — Op Note (Signed)
[name], [name]                  ACCOUNT NO.:  [id number]   MEDICAL RECORD NO.:  [id number]          PATIENT TYPE:  AMB   LOCATION:  ENDO                         [facility]AC[facility]  [facility]   PHYSICIAN:  Anselmo Rod, M.D.  DATE OF BIRTH:  1960-09-13   DATE OF PROCEDURE:  12/18/2006  DATE OF DISCHARGE:                               OPERATIVE REPORT   PROCEDURE PERFORMED:  Esophagogastroduodenoscopy with small bowel  biopsies.   ENDOSCOPIST:  Anselmo Rod, M.D.   INSTRUMENT USED:  Pentax video panendoscope.   INDICATIONS FOR PROCEDURE:  The patient is a 50 year old African-  American female with a history of iron deficiency anemia, reflux, change  in bowel habits and rectal bleeding, undergoing an  esophagogastroduodenoscopy, rule out peptic ulcer disease, esophagitis,  gastritis, etc.  Small bowel biopsy done to rule out sprue.   PREPROCEDURE PREPARATION:  Informed consent was procured from the  patient.  The patient was fasted for eight hours prior to the procedure.  The risks and benefits were discussed were discussed with the patient in  detail.   PREPROCEDURE PHYSICAL:  The patient had stable vital signs.  Neck  supple, chest clear to auscultation.  S1, S2 regular.  Abdomen soft with  normal bowel sounds.  Abdomen is obese with no hepatosplenomegaly  appreciated.   DESCRIPTION OF PROCEDURE:  The patient was placed in the left lateral  decubitus position and sedated with 75 mcg of Fentanyl and 5 mg of  Versed given intravenously in slow incremental doses.  Once the patient  was adequately sedated and maintained on low-flow oxygen and continuous  cardiac monitoring, the Pentax video panendoscope was advanced through  the mouth piece over the tongue into the esophagus under direct vision.  The entire esophagus was widely patent with no evidence of ring,  stricture, masses, esophagitis or Barrett's mucosa.  The Z-line was  healthy.  The scope was then advanced to the stomach.   Mild gastritis  was noted.  There was no evidence of a hiatal hernia on high  retroflexion.  The proximal small bowel appeared normal.  Small bowel  biopsies were done to rule out sprue.  There was no outlet obstruction.  The patient tolerated the procedure well without complications.  No  source of bleeding could be identified.   IMPRESSION:  1. Normal-appearing esophagus with no evidence of ring, stricture,      masses, esophagitis, Barrett's mucosa.  2. Normal Z-line.  3. Mild gastritis with no evidence of a hiatal hernia.  4. Normal proximal small bowel.  Small bowel biopsies done to rule out      sprue.  5. No ulcers, erosions, masses or polyps seen.   RECOMMENDATIONS:  1. Await pathology results.  2. Avoid all nonsteroidals including aspirin for the next two weeks.  3. Proceed with a colonoscopy at this time.  4. Further recommendations to be made thereafter.      Anselmo Rod, M.D.  Electronically Signed     JNM/MEDQ  D:  12/18/2006  T:  12/18/2006  Job:  [id number]   cc:  Robyn N. Allyne Gee, M.D.  Fax: [phone]

## 2010-10-29 NOTE — Consult Note (Signed)
NAMEJAXSON, KEENER                  ACCOUNT NO.:  [id number]   MEDICAL RECORD NO.:  [id number]          PATIENT TYPE:  INP   LOCATION:  3712                         FACILITY:  [facility]   PHYSICIAN:  Meade Maw, M.D.    DATE OF BIRTH:  12-Jul-1960   DATE OF CONSULTATION:  12/15/2004  DATE OF DISCHARGE:                                   CONSULTATION   INDICATION FOR CONSULT:  Chest pain.   HISTORY:  Danielle is a very pleasant 50 year old African-American female  previously evaluated for coronary ischemia; a stress Cardiolite was  performed in March 2004 and this revealed normal myocardial perfusion,  ejection fraction 71%.  She had poor exercise tolerance, exercising only for  five minutes and one second.  She was noted to have significant breast  attenuation at this time.  The patient presents with complaints of  precordial chest pain associated with burning sensation in her esophagus,  this was followed by significant burning of her scalp.  The chest pain was  associated with some shortness of breath and radiation down her left arm.  CT scan of the chest was performed to evaluate for possible pulmonary  embolus, this revealed no dissection, no filling defect consistent with  pulmonary embolus.  She was noted to have some atelectasis and small  nodularity measuring up to 6 mm.  A repeat CT scan was recommended in three  months to reassess that region.  The patient has not noted any true  aggravating factors.   PAST MEDICAL HISTORY:  1.  Chest pain as noted above with stress Cardiolite performed in March      2004.  2.  Diabetes.  3.  Dyslipidemia.  4.  Morbid obesity.   CURRENT MEDICATIONS:  1.  Aspirin 325 mg daily.  2.  Lipitor 80 mg daily.  3.  Zetia 10 mg daily.  4.  Insulin.  5.  Protonix 40 mg daily.  6.  Actos 45 mg daily.  7.  Tylenol p.r.n.  8.  Ativan p.r.n.  9.  Phenergan p.r.n.  10. Ambien p.r.n.  11. Lovenox 40 mg subcu.   SHE HAS NO KNOWN DRUG ALLERGIES.   SOCIAL HISTORY:  She is married, there is no history of alcohol or illicit  drug use.   FAMILY HISTORY:  Significant for coronary artery disease as well as  diabetes.   REVIEW OF SYSTEMS:  Is as noted per the HPI.   PHYSICAL EXAMINATION:  Blood pressure is 102/46, heart rate 80, she is  afebrile, O2 saturation 98% on room air.  HEENT:  Reveals the presence of a short neck, there is no obvious neck vein  distention.  Exam is made difficult by body habitus.  PULMONARY:  Exam reveals breath sounds which are equal and clear to  auscultation, no use of accessory muscles.  CARDIOVASCULAR:  Regular rate and rhythm, no murmurs, rubs or gallops noted.  ABDOMEN:  Soft, benign, nontender, exam again made difficult by body  habitus.  EXTREMITIES:  No peripheral edema.  NEUROLOGIC:  Nonfocal.  SKIN:  Warm and dry.  Chest x-ray reveals borderline cardiomegaly most likely related to body  habitus.   LABORATORY DATA:  A pH of 7.45, pcO2 39, bicarb 27, normal white count,  normal hematocrit 32, D-Dimer 0.95, platelet count 373,000.  Her serial  cardiac enzymes initial was total CK was elevated, but with normal CK-MB and  normal relative index, troponin I 0.02 and 0.01, myoglobin 90.  Her BNP was  less than 30.  Her ECG revealed a sinus bradycardia, there was an incomplete  right bundle branch block, and a first degree AV block noted as well.   IMPRESSION:  1.  Chest pain somewhat atypical.  Stress Cardiolite as noted above      performed in March 2004 revealing no evidence of ischemia.  At this time      in view of the patients body habitus, I would prefer to do a CT angio of      the coronary arteries.  Agree with current therapy, would also consider      a GI evaluation for pulse radiology of her chest pain.  2.  Dyslipidemia.  In view of her diabetes mellitus, LDL goal should be less      than 100.  3.  Morbid obesity.  Diet and exercise has been discussed at length in the       past.       HP/MEDQ  D:  12/15/2004  T:  12/15/2004  Job:  [id number]

## 2010-10-29 NOTE — Discharge Summary (Signed)
[name], [name]                  ACCOUNT NO.:  [id number]   MEDICAL RECORD NO.:  [id number]          PATIENT TYPE:  INP   LOCATION:  9320                          FACILITY:  [facility]   PHYSICIAN:  Osborn Coho, M.D.   DATE OF BIRTH:  [birth year]   DATE OF ADMISSION:  05/24/2007  DATE OF DISCHARGE:  05/27/2007                               DISCHARGE SUMMARY   DISCHARGE DIAGNOSIS:  Symptomatic uterine fibroids.   OPERATION:  On the date of admission, the patient underwent a total  abdominal hysterectomy followed by cystoscopy tolerating all procedures  well.  The patient was found to have a uterus with cervix weighing 753  grams.   HISTORY OF PRESENT ILLNESS:  [name] is a 50 year old African American  female para 3-0-0-3 who presents for a total abdominal hysterectomy  because of symptomatic uterine fibroids, dysmenorrhea and menorrhagia.  Please see the patient's dictated history and physical examination for  details.  Preoperative physical exam:  Blood pressure 150/60, weight is  258, height is 5 feet 3 inches tall.  EG/BUS was within normal limits.  Vagina was normal cervix was nontender without lesions.  Uterus was  approximately 18 weeks size without tenderness.  Adnexa without  tenderness or masses.   HOSPITAL COURSE:  On the day of admission the patient underwent  aforementioned procedures tolerating it well.  Due to the patient's  increased body mass index and history of sleep apnea, she was placed in  ICU due to for oxygen saturation.  The patient has otherwise had an  unremarkable postoperative course, though she did spike a temperature of  100.5 degrees Fahrenheit on the eve before discharge.  However, she  quickly defervesced.  This temperature spike was believed to be  secondary to atelectasis in the setting of sleep apnea in patient was an  increased body mass index.  Postoperative hemoglobin was 9.5 (preop  hemoglobin 11.0), and the patient's basic metabolic panel  was  essentially within normal limits though her potassium was low, it was  replaced while in the hospital.  By postop day #2, the patient had  resumed bowel and bladder function and no further spiking temperatures.  She was discharged on postop day #3 home.   DISCHARGE MEDICATIONS:  The patient was advised to refer to her home  medication reconciliation form:  1. Colace 100 mg twice daily until her bowel movements are regular.  2. Motrin 800 mg every 8 hours with food for pain.  3. Vicodin 1-2 tablets every 4 hours as needed for pain.   FOLLOW UP:  The patient is to follow up with [name] in 6 weeks, and  she is to call [facility] at [phone] to schedule her  follow-up appointment.  She is also to follow up with either [name]  or her family doctor, Dr. Allyne Gee in one week for recheck of the  patient's blood pressure.  The patient is scheduled for a sleep study on  June 21, 2006.   DISCHARGE INSTRUCTIONS:  The patient was given a copy of [facility] postoperative  instruction sheet.  She was further  advised to avoid driving for 2 weeks, heavy lifting for 4 weeks,  intercourse for 6 weeks.  She may shower.  She may walk up steps.  The  patient's diet was to be a low-sodium diabetic diet.   FINAL PATHOLOGY:  Uterus hysterectomy: Uterine cervix - benign  ectocervical and endocervical mucosa, benign keratinous cyst with  calcifications.  Uterine corpus - benign secretory endometrium,  intramural subserosal and submucosal leiomyomata.      [name].      Osborn Coho, M.D.  Electronically Signed    EJP/MEDQ  D:  06/13/2007  T:  06/13/2007  Job:  [id number]

## 2010-10-29 NOTE — H&P (Signed)
[name], [name]                  ACCOUNT NO.:  [id number]   MEDICAL RECORD NO.:  [id number]          PATIENT TYPE:  EMS   LOCATION:  MAJO                         FACILITY:  [facility]   PHYSICIAN:  Jackie Plum, M.D.DATE OF BIRTH:  20-Mar-1961   DATE OF ADMISSION:  12/15/2004  DATE OF DISCHARGE:                                HISTORY & PHYSICAL   CHIEF COMPLAINT:  Chest pain.   HISTORY OF PRESENT ILLNESS:  The patient is a 50 year old African American  lady with a history of diabetes mellitus, dyslipidemia and obesity, who  presents with the above complaint.  According to the patient and her  husband, she was well until sometime last evening when she started  experiencing some sharp chest pain while she was walking from the bathroom.  Pain was associated with some shortness of breath and this radiated to her  left shoulder.  It was about 9/10 initially and has come down to about 6-  7/10 at the time of my ED evaluation.  On presenting to the ED, the patient  was given aspirin.  She had an EKG done which showed a sinus rhythm without  acute ischemic changes .  Lab work was ordered including chest CT with  contrast to rule out PE, which is still pending at this time.  The  hospitalists' service was asked to evaluate for admission.  The patient  denies any dizziness, lightheadedness or presyncope.  She denies any PND,  orthopnea, fever or chills, cough, sputum production, frequency of  micturition, dysuria, hematuria, hematemesis, hemoptysis, melena or  hematochezia.   PAST MEDICAL HISTORY:  Past medical history positive for diabetes and  dyslipidemia, and obesity.   SOCIAL HISTORY:  The patient is married and does not smoke cigarettes nor  drink alcohol.   FAMILY HISTORY:  Family history is positive for heart disease and diabetes,  as well as hypertension.   REVIEW OF SYSTEMS:  Significant positive and negative are as stated above,  otherwise unremarkable.   PHYSICAL  EXAMINATION:  VITAL SIGNS:  Blood pressure 144/88, temperature 97.2  degrees Fahrenheit, pulse 86, respirations 20, pulse oximetry is 98% on  oxygen at 2 L/min by nasal cannula.  GENERAL:  Not in acute distress.  Moderately obese.  HEENT:  Mild conjunctival pallor, no icterus.  Oropharynx moist.  No  exudation.  NECK:  Neck supple.  No JVD.  LUNGS:  Lungs clear to auscultation.  CARDIAC:  Regular rate and rhythm.  No gallops or murmur.  ABDOMEN:  Abdomen soft and nontender.  EXTREMITIES:  No cyanosis.  No edema.  CNS:  The patient is alert and oriented x3 with no acute focal deficits.   LABORATORY WORK:  X-rays show borderline cardiomegaly without any acute  infiltrates.   Sodium 139, potassium 4.5, chloride 108, BUN 13, glucose 124, bicarbonate  27.7.  Hemoglobin is 12.2, hematocrit 36.  Point-of-care cardiac markers  were negative.  WBC count 8.6, repeat hemoglobin was 9.9, repeat hematocrit  32.0, MCV 98.9, platelet count 373,000.  D-dimer was elevated at 0.95.  BNP  1130.   CT of  the chest has been ordered to rule out PE and pending and needs to be  followed up.   IMPRESSION:  Chest pain in a 50 year old African American lady with  significant cardiac risk factors.  The patient will be admitted to the  hospital.  She has had some chest pain and we will give her some  nitroglycerin and cycle her enzymes.  The patient had a negative stress test  2-3 years ago and that apparently was done by Dr. Candace Cruise of [facility].  Should she be chest-pain-free in the morning, we will ask for  stress test in the morning.  I will make her nothing-by-mouth overnight.  If  she persists to be having chest pain, we will Cardiology evaluation in the  morning.  I discussed with the husband and the patient the plan of care and  her questions were answered appropriately.       GO/MEDQ  D:  12/15/2004  T:  12/15/2004  Job:  [id number]   cc:   Gretta Arab. Valentina Lucks, M.D.  [address]  [place]  [place] [address]  Fax: [phone]

## 2010-11-03 ENCOUNTER — Other Ambulatory Visit: Payer: Self-pay | Admitting: *Deleted

## 2010-11-05 MED ORDER — CELECOXIB 200 MG PO CAPS: 200.0000 mg | ORAL_CAPSULE | Freq: Every day | ORAL | Status: DC

## 2010-11-12 ENCOUNTER — Ambulatory Visit (INDEPENDENT_AMBULATORY_CARE_PROVIDER_SITE_OTHER): Payer: Medicaid Other | Admitting: Internal Medicine

## 2010-11-12 VITALS — BP 122/77 | HR 97 | Temp 98.3°F | Ht 63.0 in | Wt 243.4 lb

## 2010-11-12 DIAGNOSIS — Z23 Encounter for immunization: Secondary | ICD-10-CM

## 2010-11-12 DIAGNOSIS — E119 Type 2 diabetes mellitus without complications: Secondary | ICD-10-CM

## 2010-11-12 DIAGNOSIS — M549 Dorsalgia, unspecified: Secondary | ICD-10-CM

## 2010-11-12 DIAGNOSIS — I10 Essential (primary) hypertension: Secondary | ICD-10-CM

## 2010-11-12 DIAGNOSIS — R51 Headache: Secondary | ICD-10-CM

## 2010-11-12 DIAGNOSIS — K219 Gastro-esophageal reflux disease without esophagitis: Secondary | ICD-10-CM

## 2010-11-12 DIAGNOSIS — M1990 Unspecified osteoarthritis, unspecified site: Secondary | ICD-10-CM

## 2010-11-12 DIAGNOSIS — R1013 Epigastric pain: Secondary | ICD-10-CM

## 2010-11-12 LAB — GLUCOSE, CAPILLARY: Glucose-Capillary: 250 mg/dL — ABNORMAL HIGH (ref 70–99)

## 2010-11-12 LAB — POCT GLYCOSYLATED HEMOGLOBIN (HGB A1C): Hemoglobin A1C: 9.3

## 2010-11-12 MED ORDER — CELECOXIB 200 MG PO CAPS: 200.0000 mg | ORAL_CAPSULE | Freq: Every day | ORAL | Status: DC

## 2010-11-12 MED ORDER — INSULIN GLARGINE 100 UNIT/ML ~~LOC~~ SOLN: 60.0000 [IU] | Freq: Every day | SUBCUTANEOUS | Status: DC

## 2010-11-12 MED ORDER — IRBESARTAN 150 MG PO TABS: 150.0000 mg | ORAL_TABLET | Freq: Every day | ORAL | Status: DC

## 2010-11-12 MED ORDER — RANITIDINE HCL 150 MG PO TABS: 150.0000 mg | ORAL_TABLET | Freq: Two times a day (BID) | ORAL | Status: DC

## 2010-11-12 MED ORDER — GLUCOSE BLOOD VI STRP: ORAL_STRIP | Status: DC

## 2010-11-12 MED ORDER — OXYCODONE-ACETAMINOPHEN 5-325 MG PO TABS: 1.0000 | ORAL_TABLET | ORAL | Status: DC | PRN

## 2010-11-12 NOTE — Progress Notes (Signed)
Subjective:    Patient ID: [name], female    DOB: [birth year], 50 y.o.   MRN: [id number]  HPI 50yo W with DM2, severe lumbosacral radiculopathy s/p surgery in 05/2010, HTN who presents for follow-up of: 1. Headache. [name] states that for the past month, she has had occasional sharp, stabbing pain in the R-sided occiput. These episodes last only a few seconds but are excruciating and accompanied by transient blurry vision. They are unlike headaches that she has had in the past. She is worried because her mother died of a papillary brain tumor in her 85s. She had a non-contrast CT 2 years ago that showed no significant abnormalities.  2. Gastritis. Much improved. Will still occasionally feel discomfort after eating certain foods (e.g., pizza).  3. DM2. CBGs have been consistently elevated in high 100s to low 200s. No lows. Has been using 50units Lantus daily without missed doses.  4. Back pain. Continues to require regular use of Percocet. Has been bothering her somewhat more since she has been attending community college classes (climbing stairs, sitting during class). She would like to ultimately decrease amount of pain medication but is not ready to do so now.   Current Outpatient Prescriptions on File Prior to Visit  Medication Sig Dispense Refill  . celecoxib (CELEBREX) 200 MG capsule Take 1 capsule (200 mg total) by mouth daily. May take 1 capsule twice daily for acute pain.  30 capsule  5  . cyclobenzaprine (FLEXERIL) 10 MG tablet Take 1 tablet (10 mg total) by mouth 3 (three) times daily as needed for muscle spasms.  30 tablet  1  . esomeprazole (NEXIUM) 40 MG capsule Take 1 capsule (40 mg total) by mouth daily.  30 capsule  6  . gabapentin (NEURONTIN) 800 MG tablet Take 1 tablet (800 mg total) by mouth 3 (three) times daily.  90 tablet  3  . glipiZIDE (GLUCOTROL) 10 MG tablet Take 1 tablet (10 mg total) by mouth daily.  60 tablet  3  . hydrochlorothiazide 25 MG tablet Take 1 tablet  (25 mg total) by mouth daily.  30 tablet  6  . Insulin Pen Needle (UNIFINE PENTIPS) 31G X 8 MM MISC Use as directed  1 each  11  . metFORMIN (GLUCOPHAGE) 1000 MG tablet Take 1 tablet (1,000 mg total) by mouth 2 (two) times daily with meals.  60 tablet  11  . oxyCODONE-acetaminophen (PERCOCET) 5-325 MG per tablet Take 1 tablet by mouth every 4 (four) hours as needed.  90 tablet  0  . simvastatin (ZOCOR) 40 MG tablet Take 1 tablet (40 mg total) by mouth daily.  60 tablet  3  . carbamide peroxide (DEBROX) 6.5 % otic solution Place 5 drops into both ears 2 (two) times daily.  15 mL  2  . ranitidine (ZANTAC) 150 MG tablet Take 1 tablet (150 mg total) by mouth 2 (two) times daily.  60 tablet  3    Past Medical History  Diagnosis Date  . Chronic back pain   . Hyperlipidemia   . Diabetes   . Hypertension      Review of Systems  Constitutional: Negative for fever, chills and fatigue.  HENT: Negative for congestion.   Eyes: Positive for visual disturbance.  Respiratory: Negative for cough, shortness of breath and wheezing.   Cardiovascular: Negative for chest pain, palpitations and leg swelling.  Gastrointestinal: Negative for nausea, vomiting, abdominal pain, diarrhea and constipation.  Genitourinary: Negative for dysuria.  Musculoskeletal: Negative for  arthralgias.  Skin: Negative for rash.  Neurological: Positive for headaches.  Psychiatric/Behavioral: Negative for dysphoric mood.  All other systems reviewed and are negative.       Objective:   Physical Exam  Vitals reviewed. Constitutional: She is oriented to person, place, and time. No distress.  HENT:  Mouth/Throat: Oropharynx is clear and moist. No oropharyngeal exudate.  Eyes: EOM are normal. Pupils are equal, round, and reactive to light.  Neck: Normal range of motion. Neck supple. No tracheal deviation present. No thyromegaly present.  Cardiovascular: Normal rate, regular rhythm, normal heart sounds and intact distal  pulses.  Exam reveals no gallop and no friction rub.   No murmur heard. Pulmonary/Chest: Effort normal and breath sounds normal. No respiratory distress. She has no wheezes. She has no rales. She exhibits no tenderness.  Abdominal: Soft. Bowel sounds are normal. She exhibits no distension. There is no tenderness. There is no rebound and no guarding.  Musculoskeletal: Normal range of motion. She exhibits no edema and no tenderness.  Lymphadenopathy:    She has no cervical adenopathy.  Neurological: She is alert and oriented to person, place, and time. She has normal strength. No cranial nerve deficit or sensory deficit. Coordination and gait normal.  Skin: Skin is warm and dry. No rash noted. She is not diaphoretic.  Psychiatric: She has a normal mood and affect. Her behavior is normal. Judgment and thought content normal.          Assessment & Plan:

## 2010-11-12 NOTE — Patient Instructions (Signed)
Keep walking, staying active, and watching your diet to lose weight.  Check your blood sugar regularly. We increased your Lantus insulin to 60 units daily. Please call if you low blood sugars (<70) or blood sugars continue to stay >200.

## 2010-11-15 ENCOUNTER — Encounter: Payer: Self-pay | Admitting: Internal Medicine

## 2010-11-15 DIAGNOSIS — M549 Dorsalgia, unspecified: Secondary | ICD-10-CM | POA: Insufficient documentation

## 2010-11-15 NOTE — Assessment & Plan Note (Signed)
Much improved following surgery. Pain currently controlled with Percocet, allowing her to take community college classes in preparation for returning to work. Will continue current management.

## 2010-11-15 NOTE — Assessment & Plan Note (Signed)
This has improved since adding H2 blocker to Nexium. She still has occasional discomfort but only when eating certain foods (e.g. Pizza). However, she is currently pleased that symptoms are controlled. If symptoms get worse again, consider GI referral.

## 2010-11-15 NOTE — Assessment & Plan Note (Signed)
HbA1C 9.3 Blood glucose meter readings demonstrate that her CBGs have consistently been running high (180s-250s). No low blood sugar. Will increase basal insulin from 50 units to 60units daily. Patient will continue to check blood sugar regularly and call if she develops low blood sugar or persistently elevated blood sugar that remains in 200s.

## 2010-11-15 NOTE — Assessment & Plan Note (Signed)
[name] is describing intermittent, sharp pain in R occiput that is unlike any past headaches. Although these episodes are short lived (lasting only a few seconds), I am concerned that they are associated with blurry vision, new onset >age 50, and family history of brain tumor. She had a non-contrast head CT 2 years ago that showed no abnormalities. If these symptoms persist, recommend repeat head CT. Will call patient next week to further discuss.

## 2010-11-19 ENCOUNTER — Telehealth: Payer: Self-pay | Admitting: Internal Medicine

## 2010-11-19 DIAGNOSIS — Z0000 Encounter for general adult medical examination without abnormal findings: Secondary | ICD-10-CM

## 2010-11-19 DIAGNOSIS — R51 Headache: Secondary | ICD-10-CM

## 2010-11-19 NOTE — Telephone Encounter (Signed)
Called [name] to ask if she's had anymore headaches since her visit last week. She's had one more of the same stabbing occipital headache. She continues to deny any focal neurologic abnormalities. Because of her age and development of new headaches (as well as family history of brain tumor), believe that imaging is the next appropriate step. Will send for CT of head with and without contrast (hopefully can be done early next week).  [name] also mentioned she has had some stomach discomfort the past 2 days, has been taking her PPI and H2 blocker. Instructed her to call back Monday if symptoms persist. She may need referral to GI if symptoms of possible gastritis persist on H2 blocker and PPI.  [name] also mentioned that she needs referral to return to her GYN Dr. Su [name]. Will set that up.

## 2010-11-30 ENCOUNTER — Telehealth: Payer: Self-pay | Admitting: *Deleted

## 2010-11-30 NOTE — Telephone Encounter (Signed)
Completed form, per Dr. Odis Luster, for Celebrex's prior authorization faxed to [facility] for an approval.

## 2010-12-01 NOTE — Telephone Encounter (Signed)
PA for Celebrex has been approved x 6months until Nov. 21,2012. [facility]  Made awared.

## 2010-12-29 ENCOUNTER — Other Ambulatory Visit: Payer: Self-pay | Admitting: *Deleted

## 2010-12-29 DIAGNOSIS — M549 Dorsalgia, unspecified: Secondary | ICD-10-CM

## 2010-12-29 MED ORDER — OXYCODONE-ACETAMINOPHEN 5-325 MG PO TABS: 1.0000 | ORAL_TABLET | ORAL | Status: DC | PRN

## 2010-12-29 NOTE — Telephone Encounter (Signed)
Rx ready; pt was called and message left.

## 2010-12-29 NOTE — Telephone Encounter (Signed)
Call when ready @ [phone] Last refill 6/1

## 2011-01-07 ENCOUNTER — Encounter: Payer: Self-pay | Admitting: Internal Medicine

## 2011-01-07 ENCOUNTER — Ambulatory Visit (INDEPENDENT_AMBULATORY_CARE_PROVIDER_SITE_OTHER): Payer: Medicaid Other | Admitting: Internal Medicine

## 2011-01-07 DIAGNOSIS — E119 Type 2 diabetes mellitus without complications: Secondary | ICD-10-CM

## 2011-01-07 DIAGNOSIS — L600 Ingrowing nail: Secondary | ICD-10-CM

## 2011-01-07 MED ORDER — INSULIN GLARGINE 100 UNIT/ML ~~LOC~~ SOLN: 70.0000 [IU] | Freq: Every day | SUBCUTANEOUS | Status: DC

## 2011-01-07 MED ORDER — CYCLOBENZAPRINE HCL 10 MG PO TABS: 10.0000 mg | ORAL_TABLET | Freq: Three times a day (TID) | ORAL | Status: DC | PRN

## 2011-01-07 NOTE — Patient Instructions (Signed)
Pt is here for a pain in her R great toe which is probably an in-grown toenail. For this, please see the foot doctor (podiatry) for possible nail trimming. I have refilled your flexeril 10 mg. All of your other medications and diabetic supplies already have refills left on them. Please also see Ashantae Pangallo who is a nutrition specialist for more teaching about your diabetes. We are changing your diabetes medications. We have STOPPED your glipizide and we are INCREASING your nighttime lantus from 60 units at nighttime to 70 units at nighttime. If you have any problems before then feel free to call our clinic at [phone]. We would like to see you back for a visit in 1 month.  Diabetes and Exercise Regular exercise is important and can help:   Control blood glucose (sugar).   Decrease blood pressure.   Control blood lipids (cholesterol and triglycerides).   Improve overall health.  BENEFITS FROM EXERCISE:  Improved fitness.   Improved flexibility.   Improved endurance.   Increased bone density.   Weight control.   Increased muscle strength.   Decreased body fat.   Improvement of the body's use of a hormone called insulin.   Increased insulin sensitivity.   Reduction of insulin needs.   Helps you feel better.   Reduces stress and tension.  People with diabetes who add exercise to their lifestyle gain additional benefits.   Weight loss.   Reduces appetite.   Improves body's use of blood glucose (sugar).   Decreases risk factors for heart disease:   Lowering of cholesterol and triglycerides.   Raising the level of good cholesterol (high-density lipoproteins [HDL]).   Lowering blood sugar.   Decreases blood pressure.  TYPE 1 DIABETES AND EXERCISE  Exercise will usually lower your blood glucose.   If blood glucose is greater than 240 mg/dl, check urine ketones. If ketones are present, do not exercise.   Location of the insulin injection sites may need to be  adjusted with exercise. Avoid injecting insulin into areas of the body that will be exercised. For example, avoid injecting insulin into:   The arms when playing tennis.   The legs when jogging. For more information, discuss this with your caregiver.   Keep a record of:   Food intake.   Type and amount of exercise.   Expected peak times of insulin action.   Blood glucose (sugar) levels.  Do this before, during and after exercise. Review your records with your caregiver(s). This will help you to develop guidelines for adjusting food intake and/or insulin amounts.  TYPE 2 DIABETES AND EXERCISE  Regular physical activity can help control blood glucose.   Exercise is important because it may:   Increase the body's sensitivity to insulin.   Improve blood glucose control.   Exercise reduces the risk of heart disease. It decreases serum cholesterol and triglycerides. It also lowers blood pressure.   Those who take insulin or oral hypoglycemic agents should watch for signs of hypoglycemia. These signs include dizziness, shaking, sweating, chills and confusion.   Body water is lost during exercise. It must be replaced. This will help to avoid loss of body fluids (dehydration) and/or heat stroke.  Be sure to talk to your caregiver before starting an exercise program to make sure it is safe for you. Remember, any activity is better than none.  Document Released: 08/20/2003 Document Re-Released: 03/27/2009 [facility] Patient Information 2011 [place], [place].Diabetes Meal Planning Guide The diabetes meal planning guide is a tool to  help you plan your meals and snacks. It is important for people with diabetes to manage their blood sugar levels. Choosing the right foods and the right amounts throughout your day will help control your blood sugar. Eating right can even help you improve your blood pressure and reach or maintain a healthy weight. CARBOHYDRATE COUNTING MADE EASY When you eat  carbohydrates, they turn to sugar (glucose). This raises your blood sugar level. Counting carbohydrates can help you control this level so you feel better. When you plan your meals by counting carbohydrates, you can have more flexibility in what you eat and balance your medicine with your food intake. Carbohydrate counting simply means adding up the total amount of carbohydrate grams (g) in your meals or snacks. Try to eat about the same amount at each meal. Foods with carbohydrates are listed below. Each portion below is 1 carbohydrate serving or 15 grams of carbohydrates. Ask your dietician how many grams of carbohydrates you should eat at each meal or snack. Grains and Starches 1 slice bread 1/2 English muffin or hotdog/hamburger bun 3/4 cup cold cereal (unsweetened) 1/3 cup cooked pasta or rice 1/2 cup starchy vegetables (corn, potatoes, peas, beans, winter squash) 1 tortilla (6 inches) 1/4 bagel 1 waffle or pancake (size of a CD) 1/2 cup cooked cereal 4 to 6 small crackers *Whole grain is recommended Fruit 1 cup fresh unsweetened berries, melon, papaya, pineapple 1 small fresh fruit 1/2 banana or mango 1/2 cup fruit juice (4 ounces unsweetened) 1/2 cup canned fruit in natural juice or water 2 tablespoons dried fruit 12 to 15 grapes or cherries Milk and Yogurt 1 cup fat-free or 1% milk 1 cup soy milk 6 ounces light yogurt with sugar-free sweetener 6 ounces low-fat soy yogurt 6 ounces plain yogurt Vegetables 1 cup raw or 1/2 cup cooked is counted as 0 carbohydrates or a "free" food. If you eat 3 or more servings at one meal, count them as 1 carbohydrate serving. Other Carbohydrates 3/4 ounces chips or pretzels 1/2 cup ice cream or frozen yogurt 1/4 cup sherbet or sorbet 2 inch square cake, no frosting 1 tablespoon honey, sugar, jam, jelly, or syrup 2 small cookies 3 squares of graham crackers 3 cups popcorn 6 crackers 1 cup broth-based soup Count 1 cup casserole or other  mixed foods as 2 carbohydrate servings. Foods with less than 20 calories in a serving may be counted as 0 carbohydrates or a "free" food. You may want to purchase a book or computer software that lists the carbohydrate gram counts of different foods. In addition, the nutrition facts panel on the labels of the foods you eat are a good source of this information. The label will tell you how big the serving size is and the total number of carbohydrate grams you will be eating per serving. Divide this number by 15 to obtain the number of carbohydrate servings in a portion. Remember: 1 carbohydrate serving equals 15 grams of carbohydrate. SERVING SIZES Measuring foods and serving sizes helps you make sure you are getting the right amount of food. The list below tells how big or small some common serving sizes are.  1 ounce (oz) of cheese.................................4 stacked dice.   2 to 3 oz cooked meat.................................Marland KitchenDeck of cards.   1 teaspoon (tsp)...........................................Marland KitchenTip of little finger.   1 tablespoon (tbs).......................................Marland KitchenMarland KitchenThumb.   2 tbs............................................................Marland KitchenGolf ball.    cup..........................................................Marland KitchenHalf of a fist.   1 cup...........................................................Marland KitchenA fist.  SAMPLE DIABETES MEAL PLAN Below is a sample meal plan that  includes foods from the grain and starches, dairy, vegetable, fruit, and meat groups. A dietician can individualize a meal plan to fit your calorie needs and tell you the number of servings needed from each food group. However, controlling the total amount of carbohydrates in your meal or snack is more important than making sure you include all of the food groups at every meal. You may interchange carbohydrate containing foods (dairy, starches, and fruits). The meal plan below is an example of a 2000 calorie  diet using carbohydrate counting. This meal plan has 17 carbohydrate servings (carb choices). Breakfast 1 cup oatmeal (2 carb choices) 3/4 cup light yogurt (1 carb choice) 1 cup blueberries (1 carb choice) 1/4 cup almonds  Snack 1 large apple (2 carb choices) 1 low-fat string cheese stick  Lunch Chicken breast salad:  1 cup spinach   1/4 cup chopped tomatoes   2 oz chicken breast, sliced   2 tbs low-fat [place] dressing  12 whole-wheat crackers (2 carb choices) 12 to 15 grapes (1 carb choice) 1 cup low-fat milk (1 carb choice)  Snack 1 cup carrots 1/2 cup hummus (1 carb choice)  Dinner 3 oz broiled salmon 1 cup brown rice (3 carb choices)  Snack 1 1/2 cups steamed broccoli (1 carb choice) drizzled with 1 tsp olive oil and lemon juice 1 cup light pudding (2 carb choices)  DIABETES MEAL PLANNING WORKSHEET Your dietician can use this worksheet to help you decide how many servings of foods and what types of foods are right for you.  Breakfast Food Group and Servings Carb Choices Grain/Starches _______________________________________ Dairy ______________________________________________ Vegetable _______________________________________ Fruit _______________________________________________ Meat _______________________________________________ Fat _____________________________________________ Lunch Food Group and Servings Carb Choices Grain/Starches ________________________________________ Dairy _______________________________________________ Fruit ________________________________________________ Meat ________________________________________________ Fat _____________________________________________ Dinner Food Group and Servings Carb Choices Grain/Starches ________________________________________ Dairy _______________________________________________ Fruit ________________________________________________ Meat ________________________________________________ Fat  _____________________________________________ Snacks Food Group and Servings Carb Choices Grain/Starches ________________________________________ Dairy _______________________________________________ Vegetable ________________________________________ Fruit ________________________________________________ Meat ________________________________________________ Fat _____________________________________________ Daily Totals Starches _________________________ Vegetable __________________________ Fruit ______________________________ Dairy ______________________________ Meat ______________________________ Fat ________________________________  Document Released: 02/24/2005 Document Re-Released: 11/17/2009 [employer] Patient Information 2011 [place], [place].

## 2011-01-07 NOTE — Progress Notes (Signed)
Subjective:    Patient ID: [name], female    DOB: [birth year], 50 y.o.   MRN: [id number]  Back Pain This is a chronic (No change from baseline and not discussed today.) problem. Pertinent negatives include no chest pain, dysuria or fever.  Toe Pain  The incident occurred more than 1 week ago. Injury mechanism: ingrown toenail. The pain is present in the right foot (R big toe). The quality of the pain is described as stabbing. The pain is at a severity of 3/10. The pain has been fluctuating since onset. Pertinent negatives include no loss of sensation or muscle weakness. The symptoms are aggravated by [name]. [name] for the symptoms.  Diabetes [name] presents for [name] follow-up diabetic visit. [name] has type 2 diabetes mellitus. There are no hypoglycemic associated symptoms. Pertinent negatives for diabetes include no blurred vision, no chest pain, no fatigue, no foot paresthesias, no foot ulcerations, no polydipsia, no polyphagia, no polyuria and no visual change. There are no hypoglycemic complications. There are no diabetic complications. Risk factors for coronary artery disease include post-menopausal, hypertension, obesity, dyslipidemia and diabetes mellitus. Current diabetic treatment includes diet, insulin injections and oral agent (monotherapy). [name]. [name] is currently taking insulin at bedtime. [name]. Prior visit with dietician: [name] will see [name] who is our diabetic educator at the clinic. [name] is generally 180-200 mg/dl. An ACE inhibitor/angiotensin II receptor blocker is being taken. Eye exam is current.      Review of Systems  Constitutional: Negative for fever, appetite change, fatigue and unexpected weight change.  Eyes: Negative for blurred vision, pain, redness and visual disturbance.  Respiratory: Negative for shortness of breath.   Cardiovascular: Negative.  Negative for  chest pain.  Gastrointestinal: Negative for nausea, vomiting, diarrhea and rectal pain.  Genitourinary: Negative for dysuria, polyuria and difficulty urinating.  Musculoskeletal: Positive for back pain.  Hematological: Negative for polydipsia and polyphagia.       Objective:   Physical Exam  Constitutional: [name] is oriented to person, place, and [name]. [name] appears well-developed and well-nourished. No distress.  HENT:  Head: Normocephalic and atraumatic.  Eyes: EOM are normal. Pupils are equal, round, and reactive to light.  Neck: Normal [name] of motion. Neck supple. No thyromegaly present.  Cardiovascular: Normal rate, regular rhythm, normal heart sounds and intact distal pulses.   Pulmonary/Chest: Effort normal and breath sounds normal. No respiratory distress. [name] has no wheezes.  Abdominal: Soft. Bowel sounds are normal. [name] exhibits no distension and no mass. There is no tenderness. There is no rebound and no guarding.  Neurological: [name] is alert and oriented to person, place, and [name]. No cranial nerve deficit.  Skin: Skin is warm and dry. No rash noted. No erythema. No pallor.       There is an ingrown toenail on the R great toe which is not significantly inflammed or infected. There is no breakdown of skin around the area. It is not painful to palpation.  Psychiatric: [name] has a normal mood and affect.          Assessment & Plan:  1. Ingrown toenail - referral to podiatry for nail clipping.   2. DM II - last HgA1C in June 9.3. We did discuss and stop [name] glipizide today and increase [name] lantus from 60 units at bedtime to 70 units at bedtime. We did make an appointment for [name] to talk to  [name] (DME) about [name] reigmen and possible recommendations to any changes. We did review [name] which the pt brought with [name] and did note that [name] and most of [name] morning glucose levels are between 170-200. We will see [name] back in 1 month for a  repeat follow up.   3. HTN - BP today was 122/72. [name] and well-controlled. We will continue [name] home medications at this [name].  4. Chronic back pain - [name] is [name] at this [name] and we will not make any changes to [name] medications at this [name].

## 2011-01-10 NOTE — Progress Notes (Signed)
Agree with plans and notes. 

## 2011-01-14 ENCOUNTER — Telehealth: Payer: Self-pay | Admitting: *Deleted

## 2011-01-14 NOTE — Telephone Encounter (Signed)
Pt called and stated she had ingrown toenails removed 8/2 on 1 foot and will have others done next week

## 2011-01-19 ENCOUNTER — Encounter: Payer: Self-pay | Admitting: Internal Medicine

## 2011-01-20 ENCOUNTER — Encounter: Payer: Medicaid Other | Admitting: Internal Medicine

## 2011-01-20 ENCOUNTER — Ambulatory Visit: Payer: Medicaid Other | Admitting: Dietician

## 2011-01-21 ENCOUNTER — Ambulatory Visit: Payer: Medicaid Other | Admitting: Dietician

## 2011-01-31 ENCOUNTER — Ambulatory Visit: Payer: Medicaid Other | Admitting: Dietician

## 2011-01-31 ENCOUNTER — Ambulatory Visit: Payer: Medicaid Other | Admitting: Internal Medicine

## 2011-02-08 ENCOUNTER — Other Ambulatory Visit: Payer: Self-pay | Admitting: *Deleted

## 2011-02-08 DIAGNOSIS — M549 Dorsalgia, unspecified: Secondary | ICD-10-CM

## 2011-02-08 NOTE — Telephone Encounter (Signed)
Has appointment with [facility] tomorrow at 3:30, can get script at that time.

## 2011-02-09 ENCOUNTER — Encounter: Payer: Medicaid Other | Admitting: Internal Medicine

## 2011-02-09 ENCOUNTER — Ambulatory Visit: Payer: Medicaid Other | Admitting: Dietician

## 2011-02-09 ENCOUNTER — Encounter: Payer: Self-pay | Admitting: Internal Medicine

## 2011-02-09 ENCOUNTER — Ambulatory Visit (INDEPENDENT_AMBULATORY_CARE_PROVIDER_SITE_OTHER): Payer: Medicaid Other | Admitting: Internal Medicine

## 2011-02-09 DIAGNOSIS — I10 Essential (primary) hypertension: Secondary | ICD-10-CM

## 2011-02-09 DIAGNOSIS — M549 Dorsalgia, unspecified: Secondary | ICD-10-CM

## 2011-02-09 DIAGNOSIS — E119 Type 2 diabetes mellitus without complications: Secondary | ICD-10-CM

## 2011-02-09 LAB — POCT GLYCOSYLATED HEMOGLOBIN (HGB A1C): Hemoglobin A1C: 8.8

## 2011-02-09 LAB — GLUCOSE, CAPILLARY: Glucose-Capillary: 223 mg/dL — ABNORMAL HIGH (ref 70–99)

## 2011-02-09 MED ORDER — INSULIN ASPART 100 UNIT/ML ~~LOC~~ SOLN: SUBCUTANEOUS | Status: DC

## 2011-02-09 MED ORDER — OXYCODONE-ACETAMINOPHEN 5-325 MG PO TABS: 1.0000 | ORAL_TABLET | ORAL | Status: DC | PRN

## 2011-02-09 NOTE — Assessment & Plan Note (Signed)
Well-controlled at this visit blood pressure 122/79. Continue current management.

## 2011-02-09 NOTE — Assessment & Plan Note (Signed)
Hg A1c 8.8 at today's visit. She did talk extensively with [name]. We did change her regimen today. She is currently taking metformin 1000 twice a day. We started a NovoLog flex pen to be taken 5 units with 2 meals a day for 2 weeks. Then she goes to taking 5 units with every meal. Until she sees [place] back. She is also taking Lantus 70 units at bedtime. Her meter demonstrated that her blood glucoses have been running in the 180s to 220s. No low blood sugars. She will continue to check her blood sugar regularly. I did educate her on hypoglycemia and alert her that she may have these symptoms now we are adding a prandial insulin. I did give her information to take home also and give her instructions on what to do if that should happen. We will see her back in one month for close followup. I also gave her information regarding increasing her level of activity.

## 2011-02-09 NOTE — Assessment & Plan Note (Signed)
Patient does have chronic back pain, stable at this visit. I did refill her Percocet #90 for the month.

## 2011-02-09 NOTE — Progress Notes (Signed)
Subjective:    Patient ID: [name], female    DOB: [birth year], 50 y.o.   MRN: [id number]  HPI: The patient is a 50 year old female comes in today for one-month checkup for her diabetes. We did change her regimen at last visit. She was priorly taking metformin 1000 twice a day, glipizide, Lantus 60 units at nighttime. At last visit we did stop her glipizide and change her Lantus to 70 units at nighttime. She did bring her meter in today. Her meter did report that most of her morning blood sugars are in the 180-220 range and this is very similar to her meter results that she brought in one month ago. She does state that she is taking her Lantus as ordered, and taking her metformin as ordered. She states she takes all of her medications as ordered. She was supposed to meet with [name] between last visit and this visit and she did not. She will meet with [name] today. She has not been having any symptoms of increased frequency, nighttime urination, she does state she occasionally gets blurry vision. She's  been having headaches. She does have a CT scan of the head that is ordered for Friday of this week. She has not had any hypoglycemic episodes. She did have 2 ingrown toenails removed since last visit. She states they're healing well. She is wearing comfortable shoes. No other complaints at today's visit. No chest pain, shortness of breath, fevers, chills, dysuria.    Review of Systems  Constitutional: Negative for fever, chills, diaphoresis, activity change, appetite change, fatigue and unexpected weight change.  HENT: Negative.   Eyes: Negative.        Patient reports occasional blurry vision.  Respiratory: Negative.  Negative for cough, choking, chest tightness, shortness of breath and stridor.   Cardiovascular: Negative.  Negative for chest pain, palpitations and leg swelling.  Gastrointestinal: Negative.  Negative for nausea, vomiting, abdominal pain, diarrhea, constipation and  abdominal distention.  Genitourinary: Negative.  Negative for dysuria, urgency, frequency, enuresis, difficulty urinating and dyspareunia.  Musculoskeletal: Positive for back pain. Negative for myalgias, joint swelling, arthralgias and gait problem.       Patient does have chronic back pain, it is stable at today's visit.  Skin: Positive for wound. Negative for color change, pallor and rash.       Patient does have 2 ingrown toenails on the right the left hallux that were recently removed by podiatry. They're healing well.  Neurological: Negative.  Negative for dizziness, tremors, seizures, syncope, facial asymmetry, speech difficulty, weakness, light-headedness, numbness and headaches.  Hematological: Negative.   Psychiatric/Behavioral: Negative.      Vitals: Blood pressure: 122/79 Pulse: 100 Temperature: 97 Fahrenheit Height 5 foot 3 inches Weight 252 pounds    Objective:   Physical Exam  Constitutional: She is oriented to person, place, and time. She appears well-developed and well-nourished.       Patient is obese.  HENT:  Head: Normocephalic and atraumatic.  Eyes: EOM are normal. Pupils are equal, round, and reactive to light.  Neck: Normal range of motion. Neck supple. No tracheal deviation present. No thyromegaly present.  Cardiovascular: Normal rate, regular rhythm and normal heart sounds.   Pulmonary/Chest: Effort normal and breath sounds normal. No respiratory distress. She has no wheezes. She has no rales. She exhibits no tenderness.  Abdominal: Soft. Bowel sounds are normal. She exhibits no distension and no mass. There is no tenderness. There is no rebound and no guarding.  Musculoskeletal:  Normal range of motion. She exhibits no edema and no tenderness.  Lymphadenopathy:    She has no cervical adenopathy.  Neurological: She is alert and oriented to person, place, and time. No cranial nerve deficit.  Skin: Skin is warm and dry.       Patient does have 2 ingrown  toenails that were removed from her right and left hallux. They are healing. She has been wearing comfortable shoes.  Psychiatric: She has a normal mood and affect. Her behavior is normal. Judgment and thought content normal.          Assessment & Plan:  1. Followup visit-patient was seen back for followup for her diabetes. We did change her regimen again today. We did add NovoLog Flexpen We have advised her to take 5 units prior to her 2 largest meals for 2 weeks. Then to take 5 units before eating. Until she sees [place] back in one month. I have given her extractions on how to tell if she is having hypoglycemia. I did advise her of the signs and symptoms and advised her to take a hard candy or some juice in her mouth if this happens and to check her blood sugar. She did speak extensively with [name] at today's visit. Her hemoglobin A1c was 8.8 at today's visit. she is also taking metformin 1000 twice a day and 70 units of Lantus subcutaneous at bedtime.  2. Please see problem-oriented charting.  3. Disposition-patient will be seen back in one month for followup visit. Patient did start NovoLog Flex 5 units with meals at today's visit. I did refill her Percocet.

## 2011-02-09 NOTE — Patient Instructions (Addendum)
You were seen today for a follow up for your diabetes. We changed your regimen at the last visit. Your blood pressure is good today. We are not going to add another insulin to your regimen today. It is called novolog and comes in a pen. We will have you inject 5 units before meals. For the first two weeks just use it before your two biggest meals of the day. After 2 weeks please inject 5 units before every meal. Watch for any signs of low blood sugar including shakiness, lightheadedness. If these symptoms happen please check your sugar and eat some hard candy or drink some juice. Please continue to increase your level of activity as exercise is good for your whole body and your well-being.   Hypoglycemia (Low Blood Sugar) Hypoglycemia is when the glucose (sugar) in your blood is too low. Hypoglycemia can happen for many reasons. It can happen to people with or without diabetes. Hypoglycemia can develop quickly and can be a medical emergency.  CAUSES Having hypoglycemia does not mean that you will develop diabetes. Different causes include:  Missed or delayed meals or not enough carbohydrates eaten.   Medication overdose. This could be by accident or deliberate. If by accident, your medication may need to be adjusted or changed.   Exercise or increased activity without adjustments in carbohydrates or medications.   A nerve disorder that affects body functions like your heart rate, blood pressure and digestion (autonomic neuropathy).   A condition where the stomach muscles do not function properly (gastroparesis). Therefore, medications may not absorb properly.   The inability to recognize the signs of hypoglycemia (hypoglycemic unawareness).   Absorption of insulin - may be altered.   Alcohol consumption.   Pregnancy/Menstrual cycles/Postpartum. This may be due to hormones.   Certain kinds of tumors. This is very rare.  SYMPTOMS  Sweaty.   Hungry.   Dizzy.   Blurred vision.    Drowsy.   Weak.   Headache.   Rapid heart beat.   Shaky.   Nervous.  DIAGNOSIS Diagnosis is made by monitoring blood glucose in one or all of the following ways:  Fingerstick blood glucose monitoring.   Laboratory results.  TREATMENT If you think your blood glucose is low:  Check your blood glucose, if possible. If it is less than 70 mg/dl, take one of the following:   3-4 glucose tablets.    cup juice (prefer clear like apple).    cup "regular" soda pop.   1 cup milk.   -1 tube of glucose gel.   5-6 hard candies.   Do not over treat because your blood glucose (sugar) will only go too high.   Wait 15 minutes and recheck your blood glucose. If it is still less than 70 mg/dl (or below your target range), repeat treatment.   Eat a snack if it is more than one hour until your next meal.  Treatment for severe hypoglycemia Sometimes, your blood glucose may go so low that you are unable to treat yourself. You may need someone to help you. You may even pass out or be unable to swallow. This may require you to get an injection of glucagon, which raises the blood glucose. HOME CARE INSTRUCTIONS/PREVENTION  Check blood glucose as recommended by caregiver.   Take medication as prescribed by caregiver.   Follow your meal plan. Do not skip meals. Eat on time.   If you are going to drink alcohol, drink it only with meals.   Check  your blood glucose before driving.   Check your blood glucose before and after exercise. If you exercise longer or different than usual, be sure to check blood glucose more frequently.   Always carry treatment with you. Glucose tablets are the easiest to carry.   Always wear medical alert jewelry or carry some form of identification that states that you have diabetes. This will alert people that you have diabetes. If you have hypoglycemia, they will have a better idea on what to do.  SEEK MEDICAL CARE IF:  You are having problems keeping  your blood sugar at target range.   You are having frequent episodes of hypoglycemia.   You feel you might be having side effects from your medicines.   You have symptoms of an illness that is not improving after 3-4 days.   You notice a change in vision or a new problem with your vision.  SEEK IMMEDIATE MEDICAL CARE IF:  You are a family member or friend of a person whose blood glucose goes below 70 mg/dl and is accompanied by:   Confusion.   A change in mental status.   The inability to swallow.   Passing out.  Document Released: 05/30/2005 Document Re-Released: 03/26/2009 [facility] Patient Information 2011 [place], [place].  Diabetes and Exercise Regular exercise is important and can help:   Control blood glucose (sugar).   Decrease blood pressure.   Control blood lipids (cholesterol and triglycerides).   Improve overall health.  BENEFITS FROM EXERCISE:  Improved fitness.   Improved flexibility.   Improved endurance.   Increased bone density.   Weight control.   Increased muscle strength.   Decreased body fat.   Improvement of the body's use of a hormone called insulin.   Increased insulin sensitivity.   Reduction of insulin needs.   Helps you feel better.   Reduces stress and tension.  People with diabetes who add exercise to their lifestyle gain additional benefits.   Weight loss.   Reduces appetite.   Improves body's use of blood glucose (sugar).   Decreases risk factors for heart disease:   Lowering of cholesterol and triglycerides.   Raising the level of good cholesterol (high-density lipoproteins [HDL]).   Lowering blood sugar.   Decreases blood pressure.  TYPE 1 DIABETES AND EXERCISE  Exercise will usually lower your blood glucose.   If blood glucose is greater than 240 mg/dl, check urine ketones. If ketones are present, do not exercise.   Location of the insulin injection sites may need to be adjusted with exercise. Avoid  injecting insulin into areas of the body that will be exercised. For example, avoid injecting insulin into:   The arms when playing tennis.   The legs when jogging. For more information, discuss this with your caregiver.   Keep a record of:   Food intake.   Type and amount of exercise.   Expected peak times of insulin action.   Blood glucose (sugar) levels.  Do this before, during and after exercise. Review your records with your caregiver(s). This will help you to develop guidelines for adjusting food intake and/or insulin amounts.  TYPE 2 DIABETES AND EXERCISE  Regular physical activity can help control blood glucose.   Exercise is important because it may:   Increase the body's sensitivity to insulin.   Improve blood glucose control.   Exercise reduces the risk of heart disease. It decreases serum cholesterol and triglycerides. It also lowers blood pressure.   Those who take insulin or oral  hypoglycemic agents should watch for signs of hypoglycemia. These signs include dizziness, shaking, sweating, chills and confusion.   Body water is lost during exercise. It must be replaced. This will help to avoid loss of body fluids (dehydration) and/or heat stroke.  Be sure to talk to your caregiver before starting an exercise program to make sure it is safe for you. Remember, any activity is better than none.  Document Released: 08/20/2003 Document Re-Released: 03/27/2009 [facility] Patient Information 2011 [place], [place].

## 2011-02-10 ENCOUNTER — Other Ambulatory Visit: Payer: Self-pay | Admitting: Internal Medicine

## 2011-02-10 LAB — BASIC METABOLIC PANEL WITH GFR
CO2: 29 mEq/L (ref 19–32)
Chloride: 100 mEq/L (ref 96–112)
GFR, Est Non African American: >60 mL/min (ref >60)
Sodium: 138 mEq/L (ref 135–145)

## 2011-02-11 ENCOUNTER — Inpatient Hospital Stay (HOSPITAL_COMMUNITY): Admission: RE | Admit: 2011-02-11 | Payer: Medicaid Other | Source: Ambulatory Visit

## 2011-03-01 ENCOUNTER — Telehealth: Payer: Self-pay | Admitting: *Deleted

## 2011-03-01 NOTE — Telephone Encounter (Signed)
Called patient: reveiwed medications for diabetes. At end of conversation patient able to state what she will be taking: Metformin 1000 mg two times a day lantus 70 units at bedtime starting tonight Novolog 5 units before each meal.  Thanked her for calling and encouraged her to call again if blood sugars are not closer to goal by end of week.

## 2011-03-01 NOTE — Telephone Encounter (Signed)
I agree with your assessment and plan 

## 2011-03-01 NOTE — Telephone Encounter (Signed)
Pt called stating she does not feel her insulin is working.  Her CBG's have been high. She was stated on novolog 5 units with every meal.  Pt is also on lantus 70 units at bedtime.  After talking with pt and reviewing her insulin she states she thought she was to stop the lantus.  I guess this is the problem.  I will have Donnna P call pt now and reinforce that sheneeds to take both insulins. Her cbg has been over 200 for about 3 weeks and today 369.  Pt # [id number]

## 2011-03-11 ENCOUNTER — Encounter: Payer: Self-pay | Admitting: Internal Medicine

## 2011-03-11 ENCOUNTER — Ambulatory Visit (INDEPENDENT_AMBULATORY_CARE_PROVIDER_SITE_OTHER): Payer: Medicaid Other | Admitting: Internal Medicine

## 2011-03-11 ENCOUNTER — Encounter: Payer: Medicaid Other | Admitting: Internal Medicine

## 2011-03-11 DIAGNOSIS — M1990 Unspecified osteoarthritis, unspecified site: Secondary | ICD-10-CM

## 2011-03-11 DIAGNOSIS — Z23 Encounter for immunization: Secondary | ICD-10-CM

## 2011-03-11 DIAGNOSIS — E119 Type 2 diabetes mellitus without complications: Secondary | ICD-10-CM

## 2011-03-11 DIAGNOSIS — E785 Hyperlipidemia, unspecified: Secondary | ICD-10-CM

## 2011-03-11 DIAGNOSIS — I10 Essential (primary) hypertension: Secondary | ICD-10-CM

## 2011-03-11 MED ORDER — SIMVASTATIN 40 MG PO TABS: 40.0000 mg | ORAL_TABLET | Freq: Every day | ORAL | Status: DC

## 2011-03-11 MED ORDER — INSULIN GLARGINE 100 UNIT/ML ~~LOC~~ SOLN: 75.0000 [IU] | Freq: Every day | SUBCUTANEOUS | Status: DC

## 2011-03-11 MED ORDER — LOSARTAN POTASSIUM 50 MG PO TABS: 50.0000 mg | ORAL_TABLET | Freq: Every day | ORAL | Status: DC

## 2011-03-11 MED ORDER — GLIPIZIDE 10 MG PO TABS: 10.0000 mg | ORAL_TABLET | Freq: Every day | ORAL | Status: DC

## 2011-03-11 MED ORDER — CELECOXIB 200 MG PO CAPS: 200.0000 mg | ORAL_CAPSULE | Freq: Every day | ORAL | Status: DC

## 2011-03-11 NOTE — Progress Notes (Signed)
Addended by: Maura Crandall on: 03/11/2011 04:58 PM   Modules accepted: Orders

## 2011-03-11 NOTE — Assessment & Plan Note (Signed)
Given the patient's diabetes is still not well controlled on Lantus 70 units, NovoLog 5 units with meal times and metformin 1000 mg twice a day, I will increase her Lantus to 75 units at bedtime, and given to the patient is unable to take NovoLog 5 units with meal time when she is out of her home due to convenience issues, I will add glipizide 10 mg daily to help control postprandial hyperglycemia. We'll reevaluate the patient in one month. Note the patient is up-to-date with her health maintenance, and is on an angiotensin receptor blocker for renal protection.

## 2011-03-11 NOTE — Patient Instructions (Signed)
Please take glipizide 10mg  every morning, increase lantus to 75units at nighttime.

## 2011-03-11 NOTE — Progress Notes (Signed)
  Subjective:    Patient ID: [name], female    DOB: 1960/11/21, 50 y.o.   MRN: [id number]  HPI  The patient is a 50 year old female comes in today for one-month checkup for her diabetes. Patient still complains that her sugars run between 200-300, on her current regimen of Lantus 70 units at bedtime NovoLog 5 units with each meal and metformin 1000 twice a day, however she does state that she is not compliant with her NovoLog 5 units with meals as it is inconvenient for her to use this when she is outside of her home, therefore this may be contributing to her hyperglycemia, new episode of hypoglycemia noted.  No other complaints at today's visit. No chest pain, shortness of breath, fevers, chills, dysuria.   Review of Systems  All other systems reviewed and are negative.       Objective:   Physical Exam  Vitals reviewed. Constitutional: She is oriented to person, place, and time. She appears well-developed and well-nourished.  HENT:  Head: Normocephalic and atraumatic.  Eyes: Pupils are equal, round, and reactive to light.  Neck: Normal range of motion. Neck supple. No JVD present. No thyromegaly present.  Cardiovascular: Normal rate, regular rhythm and normal heart sounds.   No murmur heard. Pulmonary/Chest: Effort normal and breath sounds normal. She has no wheezes. She has no rales.  Abdominal: Soft. Bowel sounds are normal.  Musculoskeletal: Normal range of motion. She exhibits no edema.  Neurological: She is alert and oriented to person, place, and time.  Skin: Skin is warm and dry.          Assessment & Plan:

## 2011-03-21 LAB — CBC
HCT: 29.3 — ABNORMAL LOW
HCT: 34.2 — ABNORMAL LOW
Hemoglobin: 9.5 — ABNORMAL LOW
MCHC: 32.4
MCV: 75.5 — ABNORMAL LOW
MCV: 76.1 — ABNORMAL LOW
Platelets: 368
RBC: 3.88
RDW: 15.4
RDW: 15.7 — ABNORMAL HIGH

## 2011-03-21 LAB — HCG, SERUM, QUALITATIVE: Preg, Serum: NEGATIVE

## 2011-03-21 LAB — BASIC METABOLIC PANEL
BUN: 8
CO2: 32
Chloride: 100
Creatinine, Ser: 0.62
GFR calc Af Amer: >60
GFR calc non Af Amer: >60
Glucose, Bld: 128 — ABNORMAL HIGH
Glucose, Bld: 93
Potassium: 3.3 — ABNORMAL LOW
Potassium: 3.7
Sodium: 136

## 2011-03-23 ENCOUNTER — Other Ambulatory Visit: Payer: Self-pay | Admitting: *Deleted

## 2011-03-23 DIAGNOSIS — M549 Dorsalgia, unspecified: Secondary | ICD-10-CM

## 2011-03-23 MED ORDER — OXYCODONE-ACETAMINOPHEN 5-325 MG PO TABS: 1.0000 | ORAL_TABLET | Freq: Three times a day (TID) | ORAL | Status: DC | PRN

## 2011-04-22 ENCOUNTER — Encounter: Payer: Self-pay | Admitting: Internal Medicine

## 2011-04-22 ENCOUNTER — Ambulatory Visit (INDEPENDENT_AMBULATORY_CARE_PROVIDER_SITE_OTHER): Payer: Medicaid Other | Admitting: Internal Medicine

## 2011-04-22 DIAGNOSIS — M549 Dorsalgia, unspecified: Secondary | ICD-10-CM

## 2011-04-22 DIAGNOSIS — I10 Essential (primary) hypertension: Secondary | ICD-10-CM

## 2011-04-22 DIAGNOSIS — K219 Gastro-esophageal reflux disease without esophagitis: Secondary | ICD-10-CM

## 2011-04-22 DIAGNOSIS — E119 Type 2 diabetes mellitus without complications: Secondary | ICD-10-CM

## 2011-04-22 MED ORDER — OXYCODONE-ACETAMINOPHEN 5-325 MG PO TABS: 1.0000 | ORAL_TABLET | Freq: Three times a day (TID) | ORAL | Status: DC | PRN

## 2011-04-22 MED ORDER — RANITIDINE HCL 150 MG PO TABS: 150.0000 mg | ORAL_TABLET | Freq: Two times a day (BID) | ORAL | Status: DC

## 2011-04-22 NOTE — Patient Instructions (Signed)
You were seen today as a follow up to your diabetes. Stop taking insulin in the morning. Continue to take your lantus at night and your 5 units of insulin with dinner. Take your glipizide at night instead of in the morning. We will see you back in 1 month to check on your diabetes and your headaches. Continue to take alleve as needed for headache pain. If you have any questions or worsening of your headaches please feel free to call our office. Our number is [phone].

## 2011-04-22 NOTE — Progress Notes (Signed)
Subjective:    Patient ID: [name], female    DOB: Jan 09, 1961, 50 y.o.   MRN: [id number]  HPI The patient is a 50-year-old female comes in today for a followup of her diabetes. She states that her blood sugars are better controlled now with a range of 122 200. She did not bring her meter with her today so we could not verify her blood sugars. She is currently taking Lantus 75 units at night, insulin aspart 5 units before breakfast and before dinner, glipizide 10 mg daily, metformin 1000 mg twice daily. She states that since the change to her medications she has been getting a headache approximately 10:30 or 11:00 every morning which if she takes a hard candy makes it feeling better. She is also having the feeling that her food is sitting in her stomach for an excessive amount of time. This has been going on for a while but has recently become more noticeable to the patient. She states that she has been having a funny feeling in her head after she takes her gabapentin. She states that it just feels a little numb or fuzzy. Otherwise her back pain has been very well controlled on her pain medications. She states that she gets significant relief from her gabapentin. She has already had influenza vaccine this season. She states that otherwise she is taking all her medications as prescribed.   Review of Systems  Constitutional: Negative for fever, chills, diaphoresis, activity change, appetite change, fatigue and unexpected weight change.  Eyes: Negative.   Respiratory: Negative for apnea, cough, choking, chest tightness, shortness of breath, wheezing and stridor.   Cardiovascular: Negative for chest pain, palpitations and leg swelling.  Gastrointestinal: Negative for nausea, vomiting, abdominal pain, diarrhea, constipation and abdominal distention.       Pt does have reflux symptoms and the feeling that her food is sitting in her stomach for a long time. No pain.  Musculoskeletal: Positive for back  pain. Negative for myalgias, joint swelling, arthralgias and gait problem.  Skin: Negative.   Neurological: Positive for headaches. Negative for dizziness, tremors, seizures, syncope, facial asymmetry, speech difficulty, weakness, light-headedness and numbness.  Hematological: Negative.   Psychiatric/Behavioral: Negative.     Vitals: Blood pressure: 115/68 Pulse: 97 Temperature: 97.27F Weight: 254 pounds    Objective:   Physical Exam  Constitutional: She is oriented to person, place, and time. She appears well-developed and well-nourished.  HENT:  Head: Normocephalic and atraumatic.  Eyes: EOM are normal. Pupils are equal, round, and reactive to light.  Neck: Normal range of motion. Neck supple.  Cardiovascular: Normal rate and regular rhythm.   Pulmonary/Chest: Effort normal and breath sounds normal. No respiratory distress. She has no wheezes. She has no rales.  Abdominal: Soft. Bowel sounds are normal. She exhibits no distension. There is no tenderness. There is no rebound.  Musculoskeletal: Normal range of motion.  Neurological: She is alert and oriented to person, place, and time. No cranial nerve deficit.  Skin: Skin is warm and dry.          Assessment & Plan:  1. Please see problem-oriented charting.  2. Other medical problems not addressed at today's visit: Genital herpes, vitamin D deficiency, hyperlipidemia, hypokalemia, obesity, hypertension.  3. Disposition-patient will be seen back in one month in the month of December to followup on her diabetes to check a hemoglobin A1c and to check on the status of her headaches. I have informed the patient that if she is having worsening  headaches or new symptoms that she should call our office and she can be seen sooner. We are telling the patient to hold her morning dosage of NovoLog. She will continue to take Lantus at night and NovoLog with her evening meal. I have asked her to change her glipizide dosing to nighttime. No  other change to her medications at today's visit. Her ranitidine and Percocet were refilled at today's visit.

## 2011-04-22 NOTE — Assessment & Plan Note (Addendum)
Stable, BP well controlled today. Can consider switch of HCTZ to a beta blocker if her headaches are still out of control at next visit. No change to medications today.

## 2011-04-22 NOTE — Assessment & Plan Note (Signed)
Pt instructed to stop her morning novolog and continue taking her evening dose with dinner and her lantus and her metformin and her glipizide. No meter at today's visit but per pt the readings have improved however her CBG was 270 at today's visit. We will see her back in December to check HgA1C and have instructed her to bring her meter. We will adjust medications as needed at that time. Headache with relief from hard candy may be hypoglycemic episode and will stop morning dose of insulin.

## 2011-04-25 NOTE — Progress Notes (Signed)
I agree with assessment and plan of Dr. Dorise Hiss.

## 2011-05-03 ENCOUNTER — Other Ambulatory Visit: Payer: Self-pay | Admitting: Internal Medicine

## 2011-05-09 ENCOUNTER — Telehealth: Payer: Self-pay | Admitting: *Deleted

## 2011-05-09 NOTE — Telephone Encounter (Signed)
Pt had called about Nexium rx.  I called [facility]; stated Prior Authorization is required.  Do you want to try Prior Authorization Or change to a different medication?  Thanks

## 2011-05-13 ENCOUNTER — Telehealth: Payer: Self-pay | Admitting: *Deleted

## 2011-05-13 MED ORDER — PANTOPRAZOLE SODIUM 40 MG PO TBEC: 40.0000 mg | DELAYED_RELEASE_TABLET | Freq: Every day | ORAL | Status: DC

## 2011-05-13 NOTE — Telephone Encounter (Signed)
Pt was called; no answer - message left about new rx for Protonix and to call the clinic if this med  Does not help per Dr. Dorise Hiss.

## 2011-05-13 NOTE — Telephone Encounter (Signed)
Will put in Rx for protonix. If this doesn't help her we can try prior authorization. Thanks. Could you call and let her know Protonix rx is at pharmacy?

## 2011-06-03 ENCOUNTER — Encounter: Payer: Self-pay | Admitting: Internal Medicine

## 2011-06-03 ENCOUNTER — Ambulatory Visit (INDEPENDENT_AMBULATORY_CARE_PROVIDER_SITE_OTHER): Payer: Medicaid Other | Admitting: Internal Medicine

## 2011-06-03 VITALS — BP 126/82 | HR 87 | Temp 97.5°F | Ht 63.0 in | Wt 256.8 lb

## 2011-06-03 DIAGNOSIS — I10 Essential (primary) hypertension: Secondary | ICD-10-CM

## 2011-06-03 DIAGNOSIS — M549 Dorsalgia, unspecified: Secondary | ICD-10-CM

## 2011-06-03 DIAGNOSIS — K219 Gastro-esophageal reflux disease without esophagitis: Secondary | ICD-10-CM

## 2011-06-03 DIAGNOSIS — E119 Type 2 diabetes mellitus without complications: Secondary | ICD-10-CM

## 2011-06-03 DIAGNOSIS — E785 Hyperlipidemia, unspecified: Secondary | ICD-10-CM

## 2011-06-03 MED ORDER — OXYCODONE-ACETAMINOPHEN 5-325 MG PO TABS: 1.0000 | ORAL_TABLET | Freq: Three times a day (TID) | ORAL | Status: DC | PRN

## 2011-06-03 NOTE — Patient Instructions (Addendum)
You were seen today as a follow up to your diabetes. We still need to work on controlling your diabetes. Try to use 5 units of novolog with lunchtime if you eat lunch. Move your acid reflux medicine to after dinner time. Any weight you can lose will be helpful in keeping your knees healthy and out of pain. We are refilling your pain medication today. We will see you back in 6 weeks. If you need to be seen before then or need refills or have questions please feel free to call our office. Our number is [phone].   Exercise to Lose Weight Exercise and a healthy diet may help you lose weight. Your doctor may suggest specific exercises. EXERCISE IDEAS AND TIPS  Choose low-cost things you enjoy doing, such as walking, bicycling, or exercising to workout videos.   Take stairs instead of the elevator.   Walk during your lunch break.   Park your car further away from work or school.   Go to a gym or an exercise class.   Start with 5 to 10 minutes of exercise each day. Build up to 30 minutes of exercise 4 to 6 days a week.   Wear shoes with good support and comfortable clothes.   Stretch before and after working out.   Work out until you breathe harder and your heart beats faster.   Drink extra water when you exercise.   Do not do so much that you hurt yourself, feel dizzy, or get very short of breath.  Exercises that burn about 150 calories:  Running 1  miles in 15 minutes.   Playing volleyball for 45 to 60 minutes.   Washing and waxing a car for 45 to 60 minutes.   Playing touch football for 45 minutes.   Walking 1  miles in 35 minutes.   Pushing a stroller 1  miles in 30 minutes.   Playing basketball for 30 minutes.   Raking leaves for 30 minutes.   Bicycling 5 miles in 30 minutes.   Walking 2 miles in 30 minutes.   Dancing for 30 minutes.   Shoveling snow for 15 minutes.   Swimming laps for 20 minutes.   Walking up stairs for 15 minutes.   Bicycling 4 miles  in 15 minutes.   Gardening for 30 to 45 minutes.   Jumping rope for 15 minutes.   Washing windows or floors for 45 to 60 minutes.  Document Released: 07/02/2010 Document Revised: 02/09/2011 Document Reviewed: 07/02/2010 [facility] Patient Information 2012 [place], [place].

## 2011-06-03 NOTE — Assessment & Plan Note (Signed)
Patient is still having back pain. It is relieved with Neurontin. She occasionally takes Celebrex or Flexeril if she is having increasing pain. She also uses Percocet for pain. Her pain is well controlled on this regimen. She does have pain contract for 90 Percocet per month.

## 2011-06-03 NOTE — Progress Notes (Signed)
Subjective:     Patient ID: [name], female   DOB: Dec 17, 1960, 50 y.o.   MRN: [id number]  HPI The patient is a 50 year old female who comes in today for a followup of her diabetes. She also has chronic back pain and leg pain. She states that she does occasionally have a tickle in her throat at nighttime after she eats. She does take her acid reflux medication in the morning. She is also taking gabapentin daily for her pain. She takes glipizide, metformin, Lantus, NovoLog for her diabetes. She takes 75 units of Lantus at nighttime. She takes NovoLog 5 units with dinner time. She has not had any hypoglycemic episodes. Since we stopped her morning NovoLog dose she's not had any headaches.  Review of Systems  Constitutional: Negative for fever, chills, diaphoresis, activity change, appetite change, fatigue and unexpected weight change.  HENT: Negative.   Eyes: Negative.   Respiratory: Negative for cough, choking, chest tightness and shortness of breath.   Cardiovascular: Negative for chest pain, palpitations and leg swelling.  Gastrointestinal: Negative for nausea, vomiting, abdominal pain, diarrhea and constipation.  Musculoskeletal: Positive for back pain, joint swelling and arthralgias.  Skin: Negative.   Neurological: Negative for dizziness, tremors, seizures, syncope, facial asymmetry, speech difficulty, weakness, light-headedness, numbness and headaches.    Vitals: Blood pressure: 126/82 Pulse: 87 Temperature: 97.48F  Oxygen saturation on room air 97% Height: 5 feet 3 inches Weight: 256 pounds     Objective:   Physical Exam  Nursing note and vitals reviewed. Constitutional: She is oriented to person, place, and time. She appears well-developed and well-nourished.       Obese  HENT:  Head: Normocephalic and atraumatic.  Eyes: EOM are normal. Pupils are equal, round, and reactive to light.  Neck: Normal range of motion. Neck supple.  Cardiovascular: Normal rate and regular  rhythm.   Pulmonary/Chest: Breath sounds normal. No respiratory distress. She has no wheezes. She has no rales.  Abdominal: Soft. Bowel sounds are normal. She exhibits no distension. There is no tenderness. There is no rebound.  Musculoskeletal: Normal range of motion.  Neurological: She is alert and oriented to person, place, and time. No cranial nerve deficit.  Skin: Skin is warm and dry.       Assessment/Plan:     1. Please see problem-oriented charting.  2. Disposition-patient will be seen back in 6 weeks approximately to followup with me. She would like to see me in February as opposed to seeing someone else in January. At that point we will check on her diabetic progress and make sure she's not having any hypoglycemic episodes. I have advised her to try to take her NovoLog with lunchtime meal several times a week if at all possible. As per her diabetic log her sugars are highest between 12 PM and 5 PM. She is not having hypoglycemic episodes. Her hemoglobin A1c was 9.5 today. I did advise her that she should go see her nutritionist, she did decline at this time. I did advise her that any weight loss would be helpful. She did state that she make her to go back to the [facility]. I advised her to move her acid reflux medication to the nighttime to see if this helps her tickle in her throat.

## 2011-06-03 NOTE — Assessment & Plan Note (Signed)
Her hemoglobin A1c is up today to 9.5. I did advise her to take 5 units of NovoLog with lunchtime meals. Her diabetic log does show that her sugars are the highest between 12 PM and 5 PM so would seem that lunchtime NovoLog would be helpful. She is also taking Lantus, glipizide, metformin. I also informed her that any exercise/weight loss would be helpful in managing both her diabetes and her leg pain. We will followup with her closely in 6 weeks and see how she's doing with the lunchtime Lantus.

## 2011-06-03 NOTE — Assessment & Plan Note (Signed)
Patient states that she will try to go back to the [facility]. I did advise her that any exercise and/or diet/weight loss would be helpful.

## 2011-06-03 NOTE — Assessment & Plan Note (Signed)
Blood pressure today is 126/82. We will make no changes to her blood pressure regimen at today's visit. She is taking hydrochlorothiazide and losartan.

## 2011-06-03 NOTE — Assessment & Plan Note (Signed)
She does get a tickle in her throat in the evening time. I advised her to move her GERD medication to the night time. I advised her to take it after dinner. We will see if this helps with her symptoms. Otherwise her GERD is well-controlled.

## 2011-06-03 NOTE — Assessment & Plan Note (Signed)
Patient still taking simvastatin daily.

## 2011-07-11 ENCOUNTER — Ambulatory Visit (INDEPENDENT_AMBULATORY_CARE_PROVIDER_SITE_OTHER): Payer: Self-pay | Admitting: *Deleted

## 2011-07-11 DIAGNOSIS — Z111 Encounter for screening for respiratory tuberculosis: Secondary | ICD-10-CM

## 2011-07-14 ENCOUNTER — Other Ambulatory Visit: Payer: Self-pay | Admitting: Internal Medicine

## 2011-07-14 ENCOUNTER — Other Ambulatory Visit: Payer: Self-pay | Admitting: Family Medicine

## 2011-07-20 ENCOUNTER — Other Ambulatory Visit: Payer: Self-pay | Admitting: *Deleted

## 2011-07-20 MED ORDER — GABAPENTIN 800 MG PO TABS: 800.0000 mg | ORAL_TABLET | Freq: Three times a day (TID) | ORAL | Status: DC

## 2011-07-21 ENCOUNTER — Encounter: Payer: Self-pay | Admitting: Internal Medicine

## 2011-07-21 ENCOUNTER — Ambulatory Visit (INDEPENDENT_AMBULATORY_CARE_PROVIDER_SITE_OTHER): Payer: Self-pay | Admitting: Internal Medicine

## 2011-07-21 VITALS — BP 144/78 | HR 110 | Temp 97.6°F | Ht 63.0 in | Wt 252.3 lb

## 2011-07-21 DIAGNOSIS — N898 Other specified noninflammatory disorders of vagina: Secondary | ICD-10-CM

## 2011-07-21 DIAGNOSIS — I10 Essential (primary) hypertension: Secondary | ICD-10-CM

## 2011-07-21 DIAGNOSIS — R300 Dysuria: Secondary | ICD-10-CM

## 2011-07-21 DIAGNOSIS — E119 Type 2 diabetes mellitus without complications: Secondary | ICD-10-CM

## 2011-07-21 LAB — POCT URINALYSIS DIPSTICK
Bilirubin, UA: NEGATIVE
Ketones, UA: NEGATIVE
Protein, UA: NEGATIVE
Spec Grav, UA: 1.02
pH, UA: 6

## 2011-07-21 LAB — WET PREP BY MOLECULAR PROBE
Candida species: POSITIVE — AB
Gardnerella vaginalis: POSITIVE — AB

## 2011-07-21 NOTE — Progress Notes (Signed)
Addended by: Army Fossa on: 07/21/2011 03:26 PM   Modules accepted: Orders

## 2011-07-21 NOTE — Assessment & Plan Note (Signed)
Labs: Lab Results  Component Value Date   HGBA1C 9.5 06/03/2011   HGBA1C 8.5 03/25/2010   POCGLU 189 07/23/2010   CREATININE 0.70 02/09/2011   CREATININE 0.6 02/11/2010   MICROALBUR 0.99 09/30/2009   MICRALBCREAT 6.6 09/30/2009   CHOL 184 11/23/2009   HDL 35* 11/23/2009   TRIG 133 11/23/2009    Last eye exam and foot exam:    Component Value Date/Time   HMDIABFOOTEX done 04/28/2010     Assessment:  Disease Control: not controlled  Progress toward goals: deteriorated  Barriers to meeting goals: occasionally missing her insulin due to cost.   Plan: Glucometer log was not reviewed today, as pt did not have glucometer available for review.      continue current medications  reminded to bring blood glucose meter & log to each visit  Patient did not take her lantus overnight which likely explains her acutely elevated preprandial to breakfast blood sugar this morning. She took it later after that blood sugar reading. Therefore, no adjustments are made to her regimen.  Given that she is still uncontrolled, she is asked to come back in for evaluation and treatment within next 3 weeks for further diabetic med adjustment as indicated.

## 2011-07-21 NOTE — Patient Instructions (Signed)
   Please follow-up at the clinic in 3 weeks, at which time we will reevaluate your abdominal pain.  There have not been changes in your medications, please look at your complete medication list for details.   I will call you if your results are abnormal and will send meds to your pharmacy as needed.  Use tylenol for fevers.  Please follow-up in the clinic sooner if needed.  If you have been started on new medication(s), and you develop symptoms concerning for allergic reaction, including, but not limited to, throat closing, tongue swelling, rash, please stop the medication immediately and call the clinic at [phone], and go to the ER.  If you are diabetic, please bring your meter to your next visit.  If symptoms worsen, or new symptoms arise, please call the clinic or go to the ER.  Please bring all of your medications in a bag to your next visit.

## 2011-07-21 NOTE — Assessment & Plan Note (Signed)
Trace leukocytes per urine dip.  Check UA with mico and culture

## 2011-07-21 NOTE — Assessment & Plan Note (Signed)
Patient has creamy vaginal discharge noted on exam. She describes dysuria, vaginal pain and lower abdominal pain. I am concerned for possible bacterial vaginosis versus yeast infection versus possible UTI given her trace leukocytes on urine dip.  Sent out for a wet prep  Sent out for GC chlamydia  Checking urinalysis with microscopy and urine culture

## 2011-07-21 NOTE — Progress Notes (Signed)
Subjective:   Patient ID: [name] female   DOB: Nov 03, 1960 51 y.o.   MRN: [id number]  HPI: [name] is a 51 y.o. with a PMHx of DMII (uncontrolled), HTN, chronic pain, who presented to clinic today for the following:  1) Vaginal itching - Pt describes vaginal itching, vaginal pain, and bilateral abdnominal pain since last night. Has also had sharp intermittent vaginal pains over last 5 days. Confirms with dysuria, increased urinary frequency but no hematuria, increased vaginal discharge, or smelly of discharge. Is sexually active, last week was last encounter with husband, and they do no use condom. Has history of herpes genitalis, last outbreak several months ago. Is a monogomous relationship as she is aware of. No new rashes. Intermittent subjective fevers, and chills over last 5 days.  2) DM, II (last A1c 9.5 06/03/2011) - Patient checking blood sugars 2 times daily, before breakfast and dinner or when she is not feeling well. Reports fasting blood sugars of 150 mg/dL. Currently taking metformin 1000mg  BID, Lantus 75 units qHS, Novolog 5 units once daily. She misses her insulin occasionally 1x/week 0 hypoglycemic episodes since last visit. Confirms polyuria and diarrhea. Denies polydipsia, nausea, vomiting does request refills today. AM sugar was 220 (but did not take her lantus last night)  3) HTN - Patient does not check blood pressure regularly at home. Currently taking HCTZ and losartan without missing doses. denies headaches, dizziness, lightheadedness, chest pain, shortness of breath.  does not request refills today.   Review of Systems: Per HPI.  Current Outpatient Medications: Medication Sig  . aspirin 81 MG EC tablet Take 81 mg by mouth daily.    . Calcium Carbonate-Vitamin D (CALCIUM 600+D) 600-400 MG-UNIT per tablet Take 1 tablet by mouth daily.    . celecoxib (CELEBREX) 200 MG capsule Take 1 capsule (200 mg total) by mouth daily. May take 1 capsule twice daily for acute  pain.  . cyclobenzaprine (FLEXERIL) 10 MG tablet Take 1 tablet (10 mg total) by mouth 3 (three) times daily as needed for muscle spasms.  Marland Kitchen gabapentin (NEURONTIN) 800 MG tablet Take 1 tablet (800 mg total) by mouth 3 (three) times daily.  Marland Kitchen glipiZIDE (GLUCOTROL) 10 MG tablet Take 1 tablet (10 mg total) by mouth daily.  Marland Kitchen glucose blood test strip Use as instructed  . hydrochlorothiazide 25 MG tablet TAKE ONE TABLET BY MOUTH EVERY DAY  . insulin aspart (NOVOLOG FLEXPEN) 100 UNIT/ML injection Inject 5 units before mealtimes. For 2 weeks, inject prior to 2 largest meals of the day. Then, inject before every meal.  . insulin glargine (LANTUS) 100 UNIT/ML injection Inject 75 Units into the skin at bedtime. Please dispense 1 box of Lantus pens  . Insulin Pen Needle (UNIFINE PENTIPS) 31G X 8 MM MISC Use as directed  . losartan (COZAAR) 50 MG tablet TAKE ONE TABLET BY MOUTH EVERY DAY  . metFORMIN (GLUCOPHAGE) 1000 MG tablet Take 1 tablet (1,000 mg total) by mouth 2 (two) times daily with meals.  . Multiple Vitamin (MULTIVITAMIN) capsule Take 1 capsule by mouth daily.    Marland Kitchen oxyCODONE-acetaminophen (PERCOCET) 5-325 MG per tablet Take 1 tablet by mouth every 8 (eight) hours as needed for pain.  . pantoprazole (PROTONIX) 40 MG tablet Take 1 tablet (40 mg total) by mouth daily.  . ranitidine (ZANTAC) 150 MG tablet Take 1 tablet (150 mg total) by mouth 2 (two) times daily.  . simvastatin (ZOCOR) 40 MG tablet TAKE ONE TABLET BY MOUTH EVERY DAY  Allergies: No Known Allergies  Past Medical History  Diagnosis Date  . Chronic back pain   . Hyperlipidemia   . Diabetes   . Hypertension   . Uterine fibroid     s/p hysterectomy  . Cocaine abuse     in remission  . Tobacco abuse     Past Surgical History  Procedure Date  . Other surgical history     hysterectomy     Objective:   Physical Exam: Filed Vitals:   07/21/11 1022  BP: 144/78  Pulse: 110  Temp: 97.6 F (36.4 C)      General:  Vital signs reviewed and noted. Well-developed, well-nourished, in no acute distress; alert, appropriate and cooperative throughout examination.  Head: Normocephalic, atraumatic.  Lungs:  Normal respiratory effort. Clear to auscultation BL without crackles or wheezes.  Heart: RRR. S1 and S2 normal without gallop, murmur, or rubs.  Abdomen:  BS normoactive. Soft, Nondistended, non-tender.  No masses or organomegaly.  Extremities: No pretibial edema. NO CVA tenderness  GU: Creamy, malodorous discharge noted in vaginal vault. Tenderness with manual exam. No cervix appreciated.      Assessment & Plan:  Case and plan of care discussed with Dr. Margarito Liner.

## 2011-07-21 NOTE — Assessment & Plan Note (Signed)
BP Readings from Last 3 Encounters:  07/21/11 144/78  06/03/11 126/82  04/22/11 115/68    Basic Metabolic Panel:    Component Value Date/Time   NA 138 02/09/2011 1526   K 3.6 02/09/2011 1526   CL 100 02/09/2011 1526   CO2 29 02/09/2011 1526   BUN 10 02/09/2011 1526   CREATININE 0.70 02/09/2011 1526   CREATININE 0.6 02/11/2010 0614   GLUCOSE 253* 02/09/2011 1526   CALCIUM 10.4 02/09/2011 1526    Assessment: Hypertension Control: not controlled  Progress toward goals: deteriorated  Barriers to meeting goals: acutely in pain.   Plan:  continue current medications  Reassess next visit when not in acute pain

## 2011-07-22 ENCOUNTER — Other Ambulatory Visit: Payer: Self-pay | Admitting: Internal Medicine

## 2011-07-22 DIAGNOSIS — N760 Acute vaginitis: Secondary | ICD-10-CM

## 2011-07-22 DIAGNOSIS — B3731 Acute candidiasis of vulva and vagina: Secondary | ICD-10-CM | POA: Insufficient documentation

## 2011-07-22 DIAGNOSIS — B373 Candidiasis of vulva and vagina: Secondary | ICD-10-CM | POA: Insufficient documentation

## 2011-07-22 DIAGNOSIS — B9689 Other specified bacterial agents as the cause of diseases classified elsewhere: Secondary | ICD-10-CM

## 2011-07-22 LAB — URINALYSIS, MICROSCOPIC ONLY: Casts: NONE SEEN

## 2011-07-22 LAB — URINE CULTURE: Colony Count: 3000

## 2011-07-22 LAB — GC/CHLAMYDIA PROBE AMP, GENITAL
Chlamydia, DNA Probe: NEGATIVE
GC Probe Amp, Genital: NEGATIVE

## 2011-07-22 LAB — URINALYSIS, ROUTINE W REFLEX MICROSCOPIC
Bilirubin Urine: NEGATIVE
Glucose, UA: >1000 mg/dL — AB
Ketones, ur: NEGATIVE mg/dL
Protein, ur: NEGATIVE mg/dL
Urobilinogen, UA: 0.2 mg/dL (ref 0.0–1.0)

## 2011-07-22 MED ORDER — METRONIDAZOLE 500 MG PO TABS: 500.0000 mg | ORAL_TABLET | Freq: Two times a day (BID) | ORAL | Status: AC

## 2011-07-22 MED ORDER — FLUCONAZOLE 150 MG PO TABS: 150.0000 mg | ORAL_TABLET | Freq: Once | ORAL | Status: AC

## 2011-07-22 NOTE — Progress Notes (Signed)
I called Ms. Arquette to inform her that wet prep from yesterday shows + yeast and + BV. Therefore, she will need to be treated with Flagyl x 7 days and Fluconazole. Flagyl Rx was called in to her pharmacy and her fluconazole was sent electronically.   She was advised to stay away from alcohol during this time due to interaction between flagyl and alcohol. She is advised that BV is not an STD and that her husband does not need to seek care. She is instructed to be mindful of symptoms consistent with allergic reaction, when starting any new medication - in which instance she should call the clinic or go to the ER.  All questions were answered and all concerns addressed.   She also requests work excuse until Monday because feeling poorly, I agree this is appropriate. Husband will pick it up.  Johnette Abraham, D.O.

## 2011-08-05 ENCOUNTER — Telehealth: Payer: Self-pay | Admitting: *Deleted

## 2011-08-05 NOTE — Telephone Encounter (Signed)
Call from [facility] asking for clarification on dose of Novolog for pt.  They have different instructions and need to know what is current. Last written as Novolog 5 units before mealtimes. Last office visit states novolog 5 units once daily.

## 2011-08-06 NOTE — Telephone Encounter (Signed)
She was supposed to be taking it before all meals however would appear she is not and can continue taking it once daily 5 units novolog until next visit. Will clarify at next visit.

## 2011-08-09 ENCOUNTER — Other Ambulatory Visit: Payer: Self-pay | Admitting: *Deleted

## 2011-08-09 MED ORDER — METFORMIN HCL 1000 MG PO TABS: 1000.0000 mg | ORAL_TABLET | Freq: Two times a day (BID) | ORAL | Status: DC

## 2011-08-12 ENCOUNTER — Encounter: Payer: Self-pay | Admitting: Internal Medicine

## 2011-08-12 ENCOUNTER — Ambulatory Visit (INDEPENDENT_AMBULATORY_CARE_PROVIDER_SITE_OTHER): Payer: Self-pay | Admitting: Internal Medicine

## 2011-08-12 VITALS — BP 112/73 | HR 85 | Temp 97.4°F | Ht 63.0 in | Wt 254.9 lb

## 2011-08-12 DIAGNOSIS — K219 Gastro-esophageal reflux disease without esophagitis: Secondary | ICD-10-CM

## 2011-08-12 DIAGNOSIS — M549 Dorsalgia, unspecified: Secondary | ICD-10-CM

## 2011-08-12 DIAGNOSIS — E785 Hyperlipidemia, unspecified: Secondary | ICD-10-CM

## 2011-08-12 DIAGNOSIS — E119 Type 2 diabetes mellitus without complications: Secondary | ICD-10-CM

## 2011-08-12 MED ORDER — ESOMEPRAZOLE MAGNESIUM 20 MG PO CPDR: 20.0000 mg | DELAYED_RELEASE_CAPSULE | Freq: Every day | ORAL | Status: DC

## 2011-08-12 MED ORDER — PANTOPRAZOLE SODIUM 40 MG PO TBEC: 40.0000 mg | DELAYED_RELEASE_TABLET | Freq: Every day | ORAL | Status: DC

## 2011-08-12 NOTE — Patient Instructions (Signed)
You were seen today for a follow up of your sugars. They are still not as controlled as we would like. Continue to take all of your medicines. Consider taking the novolog with dinner every day and maybe sometimes taking it with lunch and dinner if you are able. If your shoulder/back pain does not get better in the next week or so or worsens please call our clinic. We will see you back in May otherwise. If you have any concerns or problems please feel free to call our office at [phone]. If you need prescriptions for [facility] please call our office and we can get that taken care of for you. Your blood pressure is good today!

## 2011-08-13 NOTE — Progress Notes (Signed)
Subjective:     Patient ID: [name], female   DOB: [birth year], 51 y.o.   MRN: [id number]  HPI Patient is a 51 year old female comes in today for a followup visit of her diabetes. She states that she has been taking her Lantus 75 units nightly. She also has been taking dinnertime NovoLog. She still noticed that her sugars are a little bit elevated. She did recently fall in the shower and is having some pain in her upper back over the scapular region. She states that when she was in the shower she slipped and fell but was able to catch herself on the shower curtain. She denies hitting her head or loss of consciousness. She states that she didn't directly fall and hit her shoulder. She did not develop the pain until the next day. She states that she has been using her Celebrex and Flexeril and they do seem to help quite a bit. She also seems to notice that after a warm shower it feels better. She is using medication assistance program to get her Celebrex and would like to get Nexium from them. She did have prescription for Protonix however they stated they would not be able to get that for her through [facility] but would be able to get Nexium for her. She is requesting a physical prescription of that at today's visit. For her diabetes she is also taking glipizide, metformin. She is taking aspirin every day. She states that she did have back surgery in the past at [facility] and is now having some pain in the other leg and they did mention that she should come back if that happened. She would like to try to go back to wake [facility] to see them again. Otherwise she is feeling good. No cold or flu symptoms. She is still going to school full time. No other complaints at today's visit. No chest pain, abdominal pain. No swelling of her legs. No headaches.  Review of Systems  Constitutional: Negative for fever, chills, diaphoresis, activity change, appetite change, fatigue and unexpected weight change.  HENT: Negative.     Eyes: Negative.   Respiratory: Negative for cough, choking, chest tightness, shortness of breath, wheezing and stridor.   Cardiovascular: Negative for chest pain, palpitations and leg swelling.  Gastrointestinal: Negative for nausea, vomiting, diarrhea, constipation, blood in stool and abdominal distention.  Genitourinary: Negative.   Musculoskeletal: Positive for myalgias and back pain.  Skin: Negative.   Neurological: Negative.  Negative for dizziness, seizures, syncope, weakness and headaches.  Hematological: Negative.   Psychiatric/Behavioral: Negative.     Vitals: Blood pressure: 112/70 Pulse: 85 Temperature: 97.52F Oxygen saturation: 95% on room air Weight: 254 pounds    Objective:   Physical Exam  Nursing note and vitals reviewed. Constitutional: She is oriented to person, place, and time. She appears well-developed and well-nourished. No distress.  HENT:  Head: Normocephalic and atraumatic.  Eyes: EOM are normal. Pupils are equal, round, and reactive to light.  Neck: Normal range of motion. Neck supple. No JVD present. No tracheal deviation present. No thyromegaly present.  Cardiovascular: Normal rate and regular rhythm.   Pulmonary/Chest: Effort normal and breath sounds normal. No respiratory distress. She has no wheezes. She has no rales. She exhibits no tenderness.  Abdominal: Soft. Bowel sounds are normal. She exhibits no distension. There is no tenderness. There is no rebound and no guarding.       obese  Musculoskeletal: Normal range of motion. She exhibits tenderness.  Tender over the right scapular area and does feel to have muscle spasm in the area.   Lymphadenopathy:    She has no cervical adenopathy.  Neurological: She is alert and oriented to person, place, and time. No cranial nerve deficit. Coordination normal.  Skin: Skin is warm and dry. No rash noted. She is not diaphoretic. No erythema. No pallor.  Psychiatric: She has a normal mood and affect.        Assessment/Plan:     1. Please see problem-oriented charting.  2. Disposition-Patient will be seen back in May with me, Dr. Dorise Hiss. She would like to wait a couple of weeks extra after she would be able to come back for hemoglobin A1c as she would rather see her primary care doctor than someone else. I think it's reasonable to wait. Have advised her to continue taking NovoLog at dinnertime. Advised her that on the weekends when she is not at school she should try taking the NovoLog with lunch time as well. I know that at this time she is unable to take  NovoLog with breakfast or lunch because she is at school and she cannot take the insulin with her however would like her to try taking it with lunch during the weekends when she is not at school. Have advised her that she can continue to use the Celebrex and Flexeril for her shoulder pain. She can also use heating pad. Have advised her that if it does not get better or if it gets worse in the next couple weeks she should call our office again to be seen to further evaluate this pain. No other change to her medication regimen at this time. She is requesting all of her prescriptions be sent to [facility]. I did advise her that it may be easier to call so that they can fax them all the same time. Did give her a physical prescription for the Nexium. And did send protonix to her pharmacy as she will need some supply until the Nexium can be obtained from [facility]. Did advise the patient that she can always call our office for refills or to be seen sooner. Did give her a copy of our office number.

## 2011-08-13 NOTE — Assessment & Plan Note (Signed)
Will come back for HgA1c in May with Dr. Dorise Hiss. Have advised her to continue using Novolog at dinnertime every day and to try to use it with lunchtime on the weekends. She uses 5 units with dinnertime now. She uses 75 units of lantus nightly. She is also taking metformin and glipizide. She is on an ARB and a daily ASA 81 mg. Last HgA1c was 9.5. Have also strongly encouraged exercise and weight loss.

## 2011-08-13 NOTE — Assessment & Plan Note (Signed)
Did talk about her symptoms today and she is out of protonix and has noticed a significant difference while she has not been taking it that she is having more reflux. Will rx protonix to her pharmacy and did give her Rx for nexium to give to [facility] so that they can work on getting this for her on a regular basis. She is also taking ranitidine daily which helps a little but not significantly.

## 2011-08-13 NOTE — Assessment & Plan Note (Signed)
Did advise the patient to continue trying to modify her diet and add exercise to help lose some weight. Encouraged that any weight lost will help her diabetes and her back pain.

## 2011-08-13 NOTE — Assessment & Plan Note (Signed)
Will work on getting her back into [facility] for this pain.

## 2011-08-13 NOTE — Assessment & Plan Note (Signed)
Pt is on a statin and continues to take it.

## 2011-08-15 ENCOUNTER — Other Ambulatory Visit: Payer: Self-pay | Admitting: *Deleted

## 2011-08-24 ENCOUNTER — Other Ambulatory Visit: Payer: Self-pay | Admitting: *Deleted

## 2011-08-24 NOTE — Telephone Encounter (Signed)
If her yeast infection did not go away or it is back I would like to have her seen back in clinic by someone if I have no availability before this is refilled. Thanks,  Dr. Dorise Hiss

## 2011-08-25 ENCOUNTER — Ambulatory Visit (INDEPENDENT_AMBULATORY_CARE_PROVIDER_SITE_OTHER): Payer: Self-pay | Admitting: Internal Medicine

## 2011-08-25 ENCOUNTER — Other Ambulatory Visit: Payer: Self-pay | Admitting: Internal Medicine

## 2011-08-25 ENCOUNTER — Encounter: Payer: Self-pay | Admitting: Internal Medicine

## 2011-08-25 ENCOUNTER — Ambulatory Visit: Payer: Self-pay | Admitting: Internal Medicine

## 2011-08-25 VITALS — BP 125/78 | HR 89 | Temp 97.6°F | Wt 254.9 lb

## 2011-08-25 DIAGNOSIS — R110 Nausea: Secondary | ICD-10-CM | POA: Insufficient documentation

## 2011-08-25 DIAGNOSIS — Z79899 Other long term (current) drug therapy: Secondary | ICD-10-CM

## 2011-08-25 DIAGNOSIS — R109 Unspecified abdominal pain: Secondary | ICD-10-CM

## 2011-08-25 DIAGNOSIS — E1140 Type 2 diabetes mellitus with diabetic neuropathy, unspecified: Secondary | ICD-10-CM | POA: Insufficient documentation

## 2011-08-25 DIAGNOSIS — E119 Type 2 diabetes mellitus without complications: Secondary | ICD-10-CM

## 2011-08-25 DIAGNOSIS — M549 Dorsalgia, unspecified: Secondary | ICD-10-CM

## 2011-08-25 DIAGNOSIS — R300 Dysuria: Secondary | ICD-10-CM

## 2011-08-25 DIAGNOSIS — R1030 Lower abdominal pain, unspecified: Secondary | ICD-10-CM | POA: Insufficient documentation

## 2011-08-25 LAB — URINALYSIS, ROUTINE W REFLEX MICROSCOPIC
Bilirubin Urine: NEGATIVE
Specific Gravity, Urine: 1.024 (ref 1.005–1.030)
Urobilinogen, UA: 0.2 mg/dL (ref 0.0–1.0)
pH: 7 (ref 5.0–8.0)

## 2011-08-25 LAB — URINALYSIS, MICROSCOPIC ONLY: Crystals: NONE SEEN

## 2011-08-25 LAB — CBC WITH DIFFERENTIAL/PLATELET
Eosinophils Absolute: 0.2 10*3/uL (ref 0.0–0.7)
Eosinophils Relative: 3 % (ref 0–5)
HCT: 41.3 % (ref 36.0–46.0)
Hemoglobin: 13.6 g/dL (ref 12.0–15.0)
Lymphocytes Relative: 47 % — ABNORMAL HIGH (ref 12–46)
Lymphs Abs: 3.2 10*3/uL (ref 0.7–4.0)
MCH: 26.1 pg (ref 26.0–34.0)
MCV: 79.3 fL (ref 78.0–100.0)
Monocytes Absolute: 0.6 10*3/uL (ref 0.1–1.0)
Monocytes Relative: 9 % (ref 3–12)
Platelets: 239 10*3/uL (ref 150–400)
RBC: 5.21 MIL/uL — ABNORMAL HIGH (ref 3.87–5.11)
WBC: 6.8 10*3/uL (ref 4.0–10.5)

## 2011-08-25 LAB — COMPREHENSIVE METABOLIC PANEL
AST: 33 U/L (ref 0–37)
Albumin: 3.6 g/dL (ref 3.5–5.2)
Alkaline Phosphatase: 92 U/L (ref 39–117)
Calcium: 10.2 mg/dL (ref 8.4–10.5)
Chloride: 95 mEq/L — ABNORMAL LOW (ref 96–112)
Glucose, Bld: 254 mg/dL — ABNORMAL HIGH (ref 70–99)
Potassium: 3.6 mEq/L (ref 3.5–5.3)
Sodium: 135 mEq/L (ref 135–145)
Total Protein: 7.8 g/dL (ref 6.0–8.3)

## 2011-08-25 LAB — POCT URINALYSIS DIPSTICK
Bilirubin, UA: NEGATIVE
Blood, UA: NEGATIVE
Glucose, UA: >=1000
Ketones, UA: NEGATIVE
Spec Grav, UA: 1.015

## 2011-08-25 MED ORDER — OXYCODONE-ACETAMINOPHEN 5-325 MG PO TABS: 1.0000 | ORAL_TABLET | Freq: Three times a day (TID) | ORAL | Status: DC | PRN

## 2011-08-25 NOTE — Assessment & Plan Note (Signed)
Urine dipstick >1000 glucose, no WBC, No Nitrites, no ketones

## 2011-08-25 NOTE — Patient Instructions (Signed)
It is unclear what is causing your abdominal pain.  It is time for a screening colonoscopy and I would like you to follow-up with a stomach doctor. We will contact you when this can be scheduled.  I will refill your Percocet today.  Follow-up with the clinic on Friday to see how you are progressing.

## 2011-08-25 NOTE — Progress Notes (Signed)
Subjective:     Patient ID: [name], female   DOB: 1961-01-16, 51 y.o.   MRN: [id number]  HPI Pt presents for f/u of dysuria and lower pelvic pain.  Recently evaluated on 08/11/11 with wet mount positive for Candida and Gardnerella, no UTI, no trich or chlamydia and follow-up on 08/12/11. She reports today of burning with urination times 2 days associated with constant bilateral upper abdominal burning and pain,and hot flashes. The pain is not aggravated by movement but is associated with nausea without vomiting. Glucose measurements "running in the 200's" at home. HgbA1c 9.3 today with CBG >200. Requesting refill of Percocet.  Review of Systems  Constitutional: Positive for chills and diaphoresis. Negative for fever and fatigue.  HENT: Negative for congestion.   Eyes: Negative for visual disturbance.  Respiratory: Negative for cough, shortness of breath and wheezing.   Cardiovascular: Negative for chest pain, palpitations and leg swelling.  Gastrointestinal: Negative for abdominal distention.  Genitourinary: Positive for dysuria. Negative for urgency, hematuria, vaginal bleeding, vaginal discharge, difficulty urinating and pelvic pain.  Musculoskeletal: Positive for back pain.  Neurological: Negative for dizziness.       Objective:   Physical Exam  Constitutional: She is oriented to person, place, and time. She appears well-developed and well-nourished. No distress.       Obese, AA female  HENT:  Head: Normocephalic and atraumatic.  Eyes: Conjunctivae and EOM are normal. Pupils are equal, round, and reactive to light.  Neck: Normal range of motion. Neck supple.  Cardiovascular: Normal rate, regular rhythm, normal heart sounds and intact distal pulses.   No murmur heard. Pulmonary/Chest: Effort normal and breath sounds normal.  Abdominal: Soft. Bowel sounds are normal. She exhibits no mass. There is tenderness. There is no rebound and no guarding.       Morbidly obese, tender to  palpation in all quadrants especially RUQ  Musculoskeletal: Normal range of motion. She exhibits no edema.  Neurological: She is alert and oriented to person, place, and time.  Skin: Skin is warm and dry.  Psychiatric: She has a normal mood and affect.       Tearful after second abdominal exam       Assessment:     1. Abd pain: RUQ> bilateral upper quadrants 2. DM: uncontrolled, HgbA1c 9 3. Dysuria: urine dipstick with >1000 glucose otherwise negative    Plan:     Will check stat CMET, CBC with diff, lipase---> no acute findings Will check Urinalysis---> negative except >1000 glucose Refill for oxycodone given---> pt left script in clinic, will hold at front office  Of note, patient with prior normal gastric emptying study in April 2012 for abdominal pain and nausea  Prior normal nuclear medicine hepatobiliary imaging with gallbladder ejection fraction of 84% November 2010  Prior CT of the abdomen and pelvis with contrast without acute abdominal or pelvic findings, fatty liver, likely right renal cyst, prominent right adnexa tissue that could not exclude a small cystic structure in November 2010.  Prior abdominal ultrasound demonstrating no acute abdominal process, and fatty liver on March 2010  Prior transabdominal and transvaginal pelvic ultrasound demonstrating enlarged uterus with multiple fibroids, mild left renal pyelectasis with no definite calyectasis  Patient will be scheduled for screening colonoscopy which is limited by her lack of insurance.  She was given information to assist with obtaining an orange card  Patient to contact clinic if symptoms worsen

## 2011-08-25 NOTE — Assessment & Plan Note (Signed)
Poorly controlled, HgbA1c 9.7 today, CBG 291 with glucosuria on Lantus 75 units, metformin 100 mg bid and glipizide 10 mg qd

## 2011-09-14 ENCOUNTER — Other Ambulatory Visit: Payer: Self-pay | Admitting: *Deleted

## 2011-09-14 NOTE — Telephone Encounter (Signed)
Pt changed mind

## 2011-09-21 ENCOUNTER — Other Ambulatory Visit: Payer: Self-pay | Admitting: Obstetrics and Gynecology

## 2011-09-21 DIAGNOSIS — Z1231 Encounter for screening mammogram for malignant neoplasm of breast: Secondary | ICD-10-CM

## 2011-09-22 ENCOUNTER — Other Ambulatory Visit: Payer: Self-pay | Admitting: Internal Medicine

## 2011-09-27 ENCOUNTER — Ambulatory Visit (INDEPENDENT_AMBULATORY_CARE_PROVIDER_SITE_OTHER): Payer: Self-pay | Admitting: *Deleted

## 2011-09-27 ENCOUNTER — Ambulatory Visit (HOSPITAL_COMMUNITY)
Admission: RE | Admit: 2011-09-27 | Discharge: 2011-09-27 | Disposition: A | Discharge status: Home / Self Care | Payer: Medicaid Other | Source: Ambulatory Visit | Attending: Obstetrics and Gynecology | Admitting: Obstetrics and Gynecology

## 2011-09-27 VITALS — BP 134/83 | HR 98 | Temp 98.5°F | Ht 63.0 in | Wt 255.4 lb

## 2011-09-27 DIAGNOSIS — Z1239 Encounter for other screening for malignant neoplasm of breast: Secondary | ICD-10-CM

## 2011-09-27 DIAGNOSIS — Z1231 Encounter for screening mammogram for malignant neoplasm of breast: Secondary | ICD-10-CM

## 2011-09-27 NOTE — Progress Notes (Signed)
Complaints of occasional left breast tenderness.  Pap Smear:    Pap smear not performed today. Patient has a history of a partial hysterectomy with her uterus removed for fibroids. Per patient last Pap smear was in 2008 and normal. Per patient has no history of abnormal Pap smears. No Pap smear results in EPIC.  Physical exam: Breasts Breasts symmetrical. Scars under bilateral breasts from history of breast reduction surgery. No nipple discharge bilateral breasts. No lymphadenopathy. No lumps palpated bilateral breasts. Some complaints of breast tenderness in various areas of bilateral breasts on exam.         Pelvic/Bimanual No Pap smear completed today since patient has a history of a hysterectomy for benign reasons. Pap smear not indicated per BCCCP guidelines.

## 2011-09-27 NOTE — Patient Instructions (Signed)
Taught patient how to perform BSE and gave educational materials to take home. Patient did not need a Pap smear today due to history of partial hysterectomy for fibroids and no history of abnormal Pap smears. Let patient know no longer needs Pap smears per ACOG and BCCCP guidelines. Patient escorted to mammography for a screening mammogram. Let patient know will follow up with her within the next couple weeks with results. Patient verbalized understanding.

## 2011-09-30 NOTE — Telephone Encounter (Signed)
Chart opened in error

## 2011-10-04 ENCOUNTER — Telehealth: Payer: Self-pay

## 2011-10-06 NOTE — Telephone Encounter (Signed)
Error

## 2011-10-27 ENCOUNTER — Ambulatory Visit (INDEPENDENT_AMBULATORY_CARE_PROVIDER_SITE_OTHER): Payer: Self-pay | Admitting: Internal Medicine

## 2011-10-27 ENCOUNTER — Encounter: Payer: Self-pay | Admitting: Internal Medicine

## 2011-10-27 VITALS — BP 130/80 | HR 80 | Temp 99.3°F | Ht 62.0 in | Wt 264.2 lb

## 2011-10-27 DIAGNOSIS — E1142 Type 2 diabetes mellitus with diabetic polyneuropathy: Secondary | ICD-10-CM

## 2011-10-27 DIAGNOSIS — M549 Dorsalgia, unspecified: Secondary | ICD-10-CM

## 2011-10-27 DIAGNOSIS — K219 Gastro-esophageal reflux disease without esophagitis: Secondary | ICD-10-CM

## 2011-10-27 DIAGNOSIS — I10 Essential (primary) hypertension: Secondary | ICD-10-CM

## 2011-10-27 DIAGNOSIS — E785 Hyperlipidemia, unspecified: Secondary | ICD-10-CM

## 2011-10-27 DIAGNOSIS — E1149 Type 2 diabetes mellitus with other diabetic neurological complication: Secondary | ICD-10-CM

## 2011-10-27 DIAGNOSIS — E1140 Type 2 diabetes mellitus with diabetic neuropathy, unspecified: Secondary | ICD-10-CM

## 2011-10-27 LAB — GLUCOSE, CAPILLARY: Glucose-Capillary: 247 mg/dL — ABNORMAL HIGH (ref 70–99)

## 2011-10-27 MED ORDER — HYDROCHLOROTHIAZIDE 25 MG PO TABS: 25.0000 mg | ORAL_TABLET | Freq: Every day | ORAL | Status: DC

## 2011-10-27 MED ORDER — OXYCODONE-ACETAMINOPHEN 5-325 MG PO TABS: 1.0000 | ORAL_TABLET | Freq: Three times a day (TID) | ORAL | Status: DC | PRN

## 2011-10-27 MED ORDER — GABAPENTIN 800 MG PO TABS: 800.0000 mg | ORAL_TABLET | Freq: Three times a day (TID) | ORAL | Status: DC

## 2011-10-27 MED ORDER — ESOMEPRAZOLE MAGNESIUM 20 MG PO CPDR: 20.0000 mg | DELAYED_RELEASE_CAPSULE | Freq: Every day | ORAL | Status: DC

## 2011-10-27 MED ORDER — CYCLOBENZAPRINE HCL 10 MG PO TABS: 10.0000 mg | ORAL_TABLET | Freq: Three times a day (TID) | ORAL | Status: AC | PRN

## 2011-10-27 MED ORDER — CYCLOBENZAPRINE HCL 10 MG PO TABS: 10.0000 mg | ORAL_TABLET | Freq: Three times a day (TID) | ORAL | Status: DC | PRN

## 2011-10-27 NOTE — Progress Notes (Signed)
Subjective:     Patient ID: [name], female   DOB: 27-Dec-1960, 51 y.o.   MRN: [id number]  HPI The patient is a 51 year old female presents today for a checkup of her diabetes and her hypertension. She is also having back pain at today's visit. She was scheduled to see [facility] this month however this was deferred until June. She will see them in June. She has been using Celebrex, Flexeril, Percocet for the pain. She does not use more than 1 Percocet per day. She is requesting refill of Percocet and Flexeril at today's visit. She has been walking 1 mile per day for the last week or 2. I do applaud her at this and encourage her strongly to continue walking every day. Her sugars have been a little high lately at home. She is taking her medications. She is taking glipizide, metformin, Lantus 80 units at night, NovoLog 10 units with lunch and 10 units with dinner. She did bring her meter in today for review. She also brought all of her medications with her. She has been out of her blood pressure medication for the last week or so. She is requesting refills at today's visit. No urinary or bowel incontinence. Pain in her back does radiate down her legs not really past the knees. She is still able to do all her ADLs and is able to walk 1 mile per day. Her diet is not the best per her. No chest pain, shortness of breath. No new muscle aches or pains. Her back pain has been stable since March. She has gained 8 pounds since last visit in March.  Review of Systems  Constitutional: Positive for activity change. Negative for fever, chills, diaphoresis, appetite change, fatigue and unexpected weight change.  Respiratory: Negative for cough, choking, chest tightness, shortness of breath and wheezing.   Cardiovascular: Negative for chest pain, palpitations and leg swelling.  Gastrointestinal: Negative for nausea, vomiting and constipation.  Genitourinary: Negative for frequency, flank pain and difficulty  urinating.  Musculoskeletal: Positive for myalgias, back pain and arthralgias. Negative for joint swelling and gait problem.  Skin: Negative for color change, pallor, rash and wound.  Neurological: Positive for headaches. Negative for dizziness, tremors, seizures, syncope, facial asymmetry, speech difficulty, weakness, light-headedness and numbness.  Psychiatric/Behavioral: Negative.     Vitals: Blood pressure: 130/80 Pulse: 80    Objective:   Physical Exam  Vitals reviewed. Constitutional: She is oriented to person, place, and time. She appears well-developed and well-nourished.       Obese  HENT:  Head: Normocephalic and atraumatic.  Eyes: EOM are normal. Pupils are equal, round, and reactive to light.  Neck: Normal range of motion. Neck supple.  Cardiovascular: Normal rate and regular rhythm.   Abdominal: Soft. Bowel sounds are normal.  Musculoskeletal: She exhibits tenderness.       In right thigh and right arm  Neurological: She is alert and oriented to person, place, and time.  Skin: Skin is warm and dry.       Assessment/Plan:  1. Please see problem-oriented charting.  2. Disposition-patient will be seen back in one month. We will increase her Lantus at nighttime to 85 units. She will continue taking NovoLog 10 units with lunch and dinner. No hypoglycemic episodes. Will refill her Flexeril and Percocet. We'll refill HCTZ, Nexium. Will recheck hemoglobin A1c at next visit.

## 2011-10-27 NOTE — Assessment & Plan Note (Signed)
Is taking hydrochlorothiazide, losartan. Blood pressure 130/80 today. No changes made at this visit.

## 2011-10-27 NOTE — Assessment & Plan Note (Signed)
Continues taking statin.

## 2011-10-27 NOTE — Assessment & Plan Note (Signed)
Patient will go see [facility] in June. She is taking Flexeril, Celebrex, Percocet as needed. Pain is under reasonable control and she is still able to maintain ADLs and has been walking 1 mile per day. Did encourage her to continue walking.

## 2011-10-27 NOTE — Patient Instructions (Addendum)
You were seen today for a follow up appointment for your diabetes. We would like you to change your lantus (at night time) to 85 units. We would like you to continue working on diet and exercise. Now that the weather is getting warmer keep walking more places. Park further away so you can walk into stores. We have refilled several of your medicines and will see you back in June to check on your diabetes and your weight.

## 2011-10-27 NOTE — Assessment & Plan Note (Signed)
Last hemoglobin A1c was 9.7. Did review her meter. She is currently taking glipizide, NovoLog, Lantus, metformin. Did increase her Lantus to 85 units at nighttime. We'll check her hemoglobin A1c next month in June. No hypoglycemic events.

## 2011-10-31 ENCOUNTER — Telehealth: Payer: Self-pay | Admitting: *Deleted

## 2011-10-31 DIAGNOSIS — Z0000 Encounter for general adult medical examination without abnormal findings: Secondary | ICD-10-CM

## 2011-10-31 NOTE — Telephone Encounter (Signed)
Pt called and is asking for a referral for colonoscopy. Would you need to see pt first? Please advise.

## 2011-10-31 NOTE — Telephone Encounter (Signed)
Printout given to [facility] for completion

## 2011-10-31 NOTE — Telephone Encounter (Signed)
No, I just saw her. As long as she isn't having some symptom or complaint that she wants this for. If it's just for cancer screening I'll put it in.   Thanks,  Dr. Dorise Hiss

## 2011-11-23 ENCOUNTER — Other Ambulatory Visit: Payer: Self-pay | Admitting: *Deleted

## 2011-11-23 DIAGNOSIS — E1140 Type 2 diabetes mellitus with diabetic neuropathy, unspecified: Secondary | ICD-10-CM

## 2011-11-23 MED ORDER — INSULIN GLARGINE 100 UNIT/ML ~~LOC~~ SOLN: 80.0000 [IU] | Freq: Every day | SUBCUTANEOUS | Status: DC

## 2011-11-23 NOTE — Telephone Encounter (Signed)
?   Pt increased to 85 units at bedtime.  Will need ? 30 ml a month

## 2011-11-23 NOTE — Telephone Encounter (Addendum)
Faxed to pharmacy.  Lantus 80 units changed to 85 units daily per last note.  Dr Dorise Hiss aware

## 2011-11-29 DIAGNOSIS — M5416 Radiculopathy, lumbar region: Secondary | ICD-10-CM | POA: Insufficient documentation

## 2011-12-01 ENCOUNTER — Other Ambulatory Visit: Payer: Self-pay | Admitting: *Deleted

## 2011-12-01 DIAGNOSIS — K219 Gastro-esophageal reflux disease without esophagitis: Secondary | ICD-10-CM

## 2011-12-01 NOTE — Telephone Encounter (Signed)
This is MAP - please fill for # [phone]

## 2011-12-02 ENCOUNTER — Encounter: Payer: Self-pay | Admitting: Internal Medicine

## 2011-12-02 ENCOUNTER — Ambulatory Visit (INDEPENDENT_AMBULATORY_CARE_PROVIDER_SITE_OTHER): Payer: Self-pay | Admitting: Internal Medicine

## 2011-12-02 VITALS — BP 116/75 | HR 94 | Temp 98.0°F | Ht 62.0 in | Wt 255.7 lb

## 2011-12-02 DIAGNOSIS — Z79899 Other long term (current) drug therapy: Secondary | ICD-10-CM

## 2011-12-02 DIAGNOSIS — E1142 Type 2 diabetes mellitus with diabetic polyneuropathy: Secondary | ICD-10-CM

## 2011-12-02 DIAGNOSIS — M1990 Unspecified osteoarthritis, unspecified site: Secondary | ICD-10-CM

## 2011-12-02 DIAGNOSIS — I10 Essential (primary) hypertension: Secondary | ICD-10-CM

## 2011-12-02 DIAGNOSIS — E1140 Type 2 diabetes mellitus with diabetic neuropathy, unspecified: Secondary | ICD-10-CM

## 2011-12-02 DIAGNOSIS — E1149 Type 2 diabetes mellitus with other diabetic neurological complication: Secondary | ICD-10-CM

## 2011-12-02 DIAGNOSIS — E119 Type 2 diabetes mellitus without complications: Secondary | ICD-10-CM

## 2011-12-02 DIAGNOSIS — M549 Dorsalgia, unspecified: Secondary | ICD-10-CM

## 2011-12-02 DIAGNOSIS — E785 Hyperlipidemia, unspecified: Secondary | ICD-10-CM

## 2011-12-02 LAB — LIPID PANEL
Cholesterol: 198 mg/dL (ref 0–200)
HDL: 38 mg/dL — ABNORMAL LOW (ref >39)
Total CHOL/HDL Ratio: 5.2 Ratio
Triglycerides: 183 mg/dL — ABNORMAL HIGH (ref <150)

## 2011-12-02 LAB — POCT GLYCOSYLATED HEMOGLOBIN (HGB A1C): Hemoglobin A1C: 9.3

## 2011-12-02 LAB — GLUCOSE, CAPILLARY: Glucose-Capillary: 220 mg/dL — ABNORMAL HIGH (ref 70–99)

## 2011-12-02 MED ORDER — OXYCODONE-ACETAMINOPHEN 5-325 MG PO TABS: 1.0000 | ORAL_TABLET | Freq: Three times a day (TID) | ORAL | Status: DC | PRN

## 2011-12-02 MED ORDER — CELECOXIB 200 MG PO CAPS: 200.0000 mg | ORAL_CAPSULE | Freq: Every day | ORAL | Status: DC

## 2011-12-02 MED ORDER — METFORMIN HCL 1000 MG PO TABS: 1000.0000 mg | ORAL_TABLET | Freq: Two times a day (BID) | ORAL | Status: DC

## 2011-12-02 MED ORDER — ESOMEPRAZOLE MAGNESIUM 20 MG PO CPDR: 20.0000 mg | DELAYED_RELEASE_CAPSULE | Freq: Every day | ORAL | Status: DC

## 2011-12-02 NOTE — Assessment & Plan Note (Addendum)
Her hemoglobin A1c is 9.3 at today's visit which is slight improvement but not significant improvement. Did change her regimen to NovoLog 10 units 3 times daily with meals. Continued Lantus 85 units at nighttime. Continued metformin twice a day and glipizide daily. We'll see her back in one month to 6 weeks for a followup visit. She did bring her meter to today's visit and encouraged her to continue bring her meter to visits.

## 2011-12-02 NOTE — Assessment & Plan Note (Signed)
Blood pressure is well-controlled today at 116/75. She is currently taking HCTZ and losartan. We'll continue this regimen.

## 2011-12-02 NOTE — Patient Instructions (Signed)
You were seen for your diabetes. We still need to work on your sugars. Your HgA1C was 9.3 today and we are going to increase your novolog to 10 units with breakfast, lunch, and dinner. Continue to [facility] the [facility] doctor and [facility]. Come back in 1 month to check on your diabetes. If you have problems or questions before then feel free to call us at [phone].

## 2011-12-02 NOTE — Assessment & Plan Note (Signed)
Patient has lost several pounds since last visit and this was encouraged. Did advise her to continue exercising. Talked about ways to tell if she is exerting herself enough of she is exercising.

## 2011-12-02 NOTE — Assessment & Plan Note (Signed)
[facility] was seen at [facility] and does have another herniated disc in her back. She is going to followup with further imaging as well as pain clinic. She will possibly get steroid injections into her back. There may be a possible surgery in her future. We'll continue her current pain regimen of Celebrex, Flexeril, Percocet, gabapentin.

## 2011-12-02 NOTE — Assessment & Plan Note (Signed)
Patient is on statin and we'll check lipid panel at today's visit.

## 2011-12-02 NOTE — Progress Notes (Signed)
Subjective:     Patient ID: [name], female   DOB: 08-04-1960, 51 y.o.   MRN: [id number]  HPI The patient is a 51 year old female comes in today for a followup of her diabetes and back pain. She was seen at [facility] for her back pain and they did tell her that she had a herniated disc in her lumbar region. They were unsure if this may be draining into the pelvic bone and need further imaging studies for workup. She also be seen in pain clinic on June 26. They will possibly be doing some injections in her back. She continues to use Celebrex, Flexeril, gabapentin, Percocet for her back pain. She states that she was given a six-day course of methylprednisolone which was supposed to help her back pain, however she states it did not help. She states that it did not increase her sugars significantly. She states that her sugars have been high at home recently and she continues to walk. She does state that she is taking all her medications as prescribed as well as the NovoLog and Lantus. She has no other complaints at today's visit does need refills on metformin, Percocet, Celebrex. She denies any hypoglycemic episodes.  Review of Systems  Constitutional: Negative for fever, diaphoresis, activity change, appetite change, fatigue and unexpected weight change.  HENT: Negative.   Eyes: Negative.   Respiratory: Negative for cough, chest tightness, shortness of breath and wheezing.   Cardiovascular: Negative for chest pain, palpitations and leg swelling.  Gastrointestinal: Negative for nausea, vomiting, abdominal pain, diarrhea and constipation.  Musculoskeletal: Positive for myalgias, back pain and arthralgias.  Skin: Negative.   Neurological: Negative.   Psychiatric/Behavioral: Negative.     Vitals: Blood pressure: 116/75 Pulse: 94 Temperature: 40F Height: 5 feet 2 inches Oxygen saturation room air: 98% Weight: 255 pounds    Objective:   Physical Exam  Constitutional: She is oriented to  person, place, and time. She appears well-developed and well-nourished.       Obese  HENT:  Head: Normocephalic and atraumatic.  Eyes: EOM are normal. Pupils are equal, round, and reactive to light.  Neck: Neck supple.  Cardiovascular: Normal rate and normal heart sounds.   Pulmonary/Chest: Effort normal and breath sounds normal.  Abdominal: Soft. Bowel sounds are normal. She exhibits no distension and no mass. There is no tenderness. There is no guarding.  Neurological: She is alert and oriented to person, place, and time.  Skin: Skin is warm and dry.       Assessment/Plan:   1 please see problem-oriented charting.  2. Disposition-patient will be seen back in 6 weeks for followup. She is changed from NovoLog twice daily to NovoLog 3 times daily. She will continue taking 85 units of Lantus at nighttime, metformin 1000 mg twice daily, glipizide 10 mg daily. Advised her to continue following with the pain clinic and [facility] for her back pain. Gave her instruction to call us if she has any problems or questions before next visit. Did check lipid panel at today's visit.

## 2011-12-07 DIAGNOSIS — M961 Postlaminectomy syndrome, not elsewhere classified: Secondary | ICD-10-CM

## 2011-12-07 HISTORY — DX: Postlaminectomy syndrome, not elsewhere classified: M96.1

## 2011-12-07 NOTE — Telephone Encounter (Signed)
Rx faxed in.

## 2011-12-08 ENCOUNTER — Telehealth: Payer: Self-pay | Admitting: *Deleted

## 2011-12-08 NOTE — Telephone Encounter (Addendum)
Return call from nurse at Pain clinic and she states she can not send the notes as they don't have release of info. She did say they started pt on topamax and explained the need for weight loss to pt.  They will wean off neurontin.  Tramadol for breakthrough pain.  Pt states she can't afford topamax so was told to increase neurontin per pain clinic doctor.  Pt told to call pain clinic back and have them adjust neurontin and give her a new Rx as needed. She will do this.

## 2011-12-08 NOTE — Telephone Encounter (Signed)
Pt called stating she went to the pain clinic @ [facility] Pain service at [place] # [phone]. He increased her neurontin to 6 pills a day and she wanted a new Rx with higher mg so she will not have to take so many pills a day.  I called [facility] and requested record on this visit.

## 2012-01-09 ENCOUNTER — Encounter: Payer: Self-pay | Admitting: Internal Medicine

## 2012-01-09 ENCOUNTER — Ambulatory Visit (INDEPENDENT_AMBULATORY_CARE_PROVIDER_SITE_OTHER): Payer: Self-pay | Admitting: Internal Medicine

## 2012-01-09 VITALS — BP 128/89 | HR 91 | Temp 98.3°F | Ht 64.0 in | Wt 250.8 lb

## 2012-01-09 DIAGNOSIS — I10 Essential (primary) hypertension: Secondary | ICD-10-CM

## 2012-01-09 DIAGNOSIS — N760 Acute vaginitis: Secondary | ICD-10-CM | POA: Insufficient documentation

## 2012-01-09 DIAGNOSIS — N898 Other specified noninflammatory disorders of vagina: Secondary | ICD-10-CM

## 2012-01-09 DIAGNOSIS — E119 Type 2 diabetes mellitus without complications: Secondary | ICD-10-CM

## 2012-01-09 DIAGNOSIS — E1142 Type 2 diabetes mellitus with diabetic polyneuropathy: Secondary | ICD-10-CM

## 2012-01-09 DIAGNOSIS — E785 Hyperlipidemia, unspecified: Secondary | ICD-10-CM

## 2012-01-09 DIAGNOSIS — M549 Dorsalgia, unspecified: Secondary | ICD-10-CM

## 2012-01-09 DIAGNOSIS — R300 Dysuria: Secondary | ICD-10-CM

## 2012-01-09 DIAGNOSIS — E1149 Type 2 diabetes mellitus with other diabetic neurological complication: Secondary | ICD-10-CM

## 2012-01-09 DIAGNOSIS — E1140 Type 2 diabetes mellitus with diabetic neuropathy, unspecified: Secondary | ICD-10-CM

## 2012-01-09 DIAGNOSIS — K219 Gastro-esophageal reflux disease without esophagitis: Secondary | ICD-10-CM

## 2012-01-10 ENCOUNTER — Telehealth: Payer: Self-pay | Admitting: Internal Medicine

## 2012-01-10 ENCOUNTER — Encounter: Payer: Self-pay | Admitting: Internal Medicine

## 2012-01-10 DIAGNOSIS — R300 Dysuria: Secondary | ICD-10-CM | POA: Insufficient documentation

## 2012-01-10 LAB — URINALYSIS, ROUTINE W REFLEX MICROSCOPIC
Glucose, UA: NEGATIVE mg/dL
Hgb urine dipstick: NEGATIVE
Ketones, ur: NEGATIVE mg/dL
Protein, ur: NEGATIVE mg/dL
pH: 8 (ref 5.0–8.0)

## 2012-01-10 LAB — URINALYSIS, MICROSCOPIC ONLY
Bacteria, UA: NONE SEEN
Casts: NONE SEEN

## 2012-01-10 MED ORDER — FLUCONAZOLE 100 MG PO TABS: 150.0000 mg | ORAL_TABLET | Freq: Once | ORAL | Status: DC

## 2012-01-10 NOTE — Assessment & Plan Note (Signed)
Uncontrolled  Last HAIC 9.3, fsbs avg 192 at home and ranging 103 to 274  Plan Address at f/u 01/13/12

## 2012-01-10 NOTE — Assessment & Plan Note (Signed)
Plan Wet prep, GC urine, UA performed If + will call pt Rx into [facility] by 01/10/12

## 2012-01-10 NOTE — Addendum Note (Signed)
Addended by: Annett Gula on: 01/10/2012 05:23 PM   Modules accepted: Orders

## 2012-01-10 NOTE — Assessment & Plan Note (Addendum)
Vaginal exam with erythema internal mucosa, yellow to brown thick d/c. No obvious lesions of genital herpes as pt has history  Plan Wet prep, GC urine, UA performed If + will call pt Rx into [facility] by 01/10/12 Disc'ed using milder soaps in the private area i.e unscented Dove or summers eve w/o fragrance

## 2012-01-10 NOTE — Progress Notes (Signed)
Subjective:    Patient ID: [name], female    DOB: [birth year], 51 y.o.   MRN: [id number]  HPI Comments: 51 y.o woman PMH genital herpes, HTN, HLD, uncontrolled DM obesity, vit D deficiency, GERD, rectal bleeding, chronic back and leg pain.    Pt presents for vaginal discharge and odor x 2 days associated with intermittent dysuria, internal discomfort, itching since Friday prior to visity.  Pt denies fever, ab pain, vomiting on ROS but reports nausea and sweating. Last sexual encounter was 1 month ago with husband.  Pt has been using Dove body wash in the vaginal area.    On todays visit she also discusses chronic back and leg pain (sensation of tingling and burning b/l legs).  She has had back su at [facility] Neurosurgery department.  Back pain is 8/10 rad to legs.  She has an appt with [facility] tomorrow and follows a pain specialist in [place]  She also has DM 2. Her last HA1C was 9.3. She is averaging 192 according to home meter readings with readings ranging 103-274 but pt denies sx's of hypoglycemia.   Vaginal Discharge The patient's primary symptoms include genital itching, a genital odor and a vaginal discharge. The patient's pertinent negatives include no genital lesions or missed menses. This is a new problem. The current episode started in the past 7 days. The problem has been gradually worsening. The pain is mild. She is not pregnant. Associated symptoms include back pain and dysuria. Pertinent negatives include no chills, fever or sore throat. Associated symptoms comments: Intermittent ab pain, dysuria +nausea and decreased appetite, sweats. The vaginal discharge was malodorous (fishy odor). The symptoms are aggravated by urinating. She has tried nothing for the symptoms. She is sexually active (sexually active with husband last time x 1 mo ago). She uses hysterectomy for contraception. She is postmenopausal (LMP 4 years ago). Her past medical history is significant for a  gynecological surgery and herpes simplex.      Review of Systems  Constitutional: Positive for diaphoresis. Negative for fever and chills.       Decreased physical activity due to back pain  HENT: Negative for sore throat.   Respiratory: Negative for shortness of breath.   Cardiovascular: Negative for chest pain and leg swelling.  Genitourinary: Positive for dysuria and vaginal discharge. Negative for genital sores, menstrual problem and missed menses.  Musculoskeletal: Positive for back pain.  Psychiatric/Behavioral: Positive for dysphoric mood.       Sad and crying episodes d/t back pain and leg pain (chronic)       Objective:   Physical Exam  Nursing note and vitals reviewed. Constitutional: She is oriented to person, place, and time. Vital signs are normal. She appears well-developed and well-nourished. She is cooperative. No distress.       Pt tearful on interview when discussing back pain  HENT:  Head: Normocephalic and atraumatic.  Mouth/Throat: Oropharynx is clear and moist and mucous membranes are normal. No oropharyngeal exudate.  Eyes: Conjunctivae are normal. Pupils are equal, round, and reactive to light. No scleral icterus.  Cardiovascular: Normal rate, regular rhythm, S1 normal, S2 normal and normal heart sounds.   No murmur heard.      No lower ext edema b/l  Pulmonary/Chest: Effort normal and breath sounds normal. No respiratory distress. She has no wheezes.  Abdominal: Soft. Bowel sounds are normal. She exhibits no distension. There is tenderness in the right lower quadrant and left lower quadrant.  Mild TTP, epigastric region, LLQ, RLQ-no rebound/guarding  Obese abdomen  Genitourinary: There is no rash or lesion on the right labia. There is erythema around the vagina. Vaginal discharge found.       No cervix s/p hysterectomy  Erythema noted in internal vaginal mucosa with yellow to brown d/c. No obvious odor  LMP 4 years ago  No CVA TTP    Musculoskeletal:       Lumbar back: She exhibits tenderness and pain.       Back:  Neurological: She is alert and oriented to person, place, and time. Gait abnormal.       Pt walks with limp  Skin: Skin is warm, dry and intact. No rash noted. She is not diaphoretic.  Psychiatric: Her speech is normal and behavior is normal. Cognition and memory are normal. She exhibits a depressed mood.       Tearful when interviewing d/t back pain          Assessment & Plan:  Pt has f/u appt 01/13/12

## 2012-01-10 NOTE — Assessment & Plan Note (Signed)
Lipid Panel     Component Value Date/Time   CHOL 198 12/02/2011 1448   TRIG 183* 12/02/2011 1448   HDL 38* 12/02/2011 1448   CHOLHDL 5.2 12/02/2011 1448   VLDL 37 12/02/2011 1448   LDLCALC 123* 12/02/2011 1448   ldl not at goal Pt is on a statin Address at 01/13/12 f/u regular scheduled appt

## 2012-01-10 NOTE — Assessment & Plan Note (Signed)
Chronic rad to b/l leg Pt following with pain specialist in [place] and [facility] appt 01/10/12 with Neurosurgery

## 2012-01-10 NOTE — Assessment & Plan Note (Signed)
Cont Nexium

## 2012-01-10 NOTE — Assessment & Plan Note (Signed)
BP 128/89 today Diastolic can be more controlled in setting of DM  Plan Pt cont taking Cozaar and HCTZ Address at 01/13/12 appt

## 2012-01-10 NOTE — Telephone Encounter (Signed)
Called pt [phone]. Left msg stating we do not have the results of her wet prep yet. Empirically Rx Diflucan 150 mg x 1 to the [facility]. Will call pt when we get more results Shirlee Latch

## 2012-01-10 NOTE — Addendum Note (Signed)
Addended by: Annett Gula on: 01/10/2012 09:59 PM   Modules accepted: Orders

## 2012-01-10 NOTE — Progress Notes (Signed)
I saw patient and discussed her care with resident Dr. McLean.  I agree with the assessment and plans as outlined in her note. 

## 2012-01-10 NOTE — Addendum Note (Signed)
Addended by: Annett Gula on: 01/10/2012 09:51 PM   Modules accepted: Orders

## 2012-01-11 ENCOUNTER — Telehealth: Payer: Self-pay | Admitting: Internal Medicine

## 2012-01-11 MED ORDER — METRONIDAZOLE 500 MG PO TABS: 500.0000 mg | ORAL_TABLET | Freq: Once | ORAL | Status: DC

## 2012-01-11 NOTE — Telephone Encounter (Signed)
Called patient reviewed wet prep results +trichomonas. She denies allergy to Flagyl. Disc partner (husband) being treated as well.  Advised not to drink on this medication. Rx Flagyl 500 mg (total 2g or 4 pills) x 1 no refills to [facility] on [address].   Shirlee Latch

## 2012-01-11 NOTE — Addendum Note (Signed)
Addended by: Annett Gula on: 01/11/2012 05:47 PM   Modules accepted: Orders

## 2012-01-13 ENCOUNTER — Ambulatory Visit (INDEPENDENT_AMBULATORY_CARE_PROVIDER_SITE_OTHER): Payer: Self-pay | Admitting: Internal Medicine

## 2012-01-13 ENCOUNTER — Encounter: Payer: Self-pay | Admitting: Internal Medicine

## 2012-01-13 ENCOUNTER — Other Ambulatory Visit: Payer: Self-pay | Admitting: *Deleted

## 2012-01-13 VITALS — BP 122/71 | HR 91 | Temp 96.6°F | Ht 64.0 in | Wt 251.4 lb

## 2012-01-13 DIAGNOSIS — I10 Essential (primary) hypertension: Secondary | ICD-10-CM

## 2012-01-13 DIAGNOSIS — M549 Dorsalgia, unspecified: Secondary | ICD-10-CM

## 2012-01-13 DIAGNOSIS — E1140 Type 2 diabetes mellitus with diabetic neuropathy, unspecified: Secondary | ICD-10-CM

## 2012-01-13 DIAGNOSIS — E1149 Type 2 diabetes mellitus with other diabetic neurological complication: Secondary | ICD-10-CM

## 2012-01-13 DIAGNOSIS — E1142 Type 2 diabetes mellitus with diabetic polyneuropathy: Secondary | ICD-10-CM

## 2012-01-13 DIAGNOSIS — Z299 Encounter for prophylactic measures, unspecified: Secondary | ICD-10-CM

## 2012-01-13 DIAGNOSIS — Z23 Encounter for immunization: Secondary | ICD-10-CM

## 2012-01-13 DIAGNOSIS — E785 Hyperlipidemia, unspecified: Secondary | ICD-10-CM

## 2012-01-13 MED ORDER — PNEUMOCOCCAL VAC POLYVALENT 25 MCG/0.5ML IJ INJ: 0.5000 mL | INJECTION | Freq: Once | INTRAMUSCULAR | Status: DC

## 2012-01-13 MED ORDER — ATORVASTATIN CALCIUM 40 MG PO TABS: 40.0000 mg | ORAL_TABLET | Freq: Every day | ORAL | Status: DC

## 2012-01-13 MED ORDER — INSULIN ASPART 100 UNIT/ML ~~LOC~~ SOLN: SUBCUTANEOUS | Status: DC

## 2012-01-13 NOTE — Progress Notes (Addendum)
Subjective:     Patient ID: [name], female   DOB: [birth year], 51 y.o.   MRN: [id number]  HPI The patient is a 51 year old female comes in today for a followup of her back pain, diabetes. She was having symptoms of some vaginitis about a week and half ago and did come in to see an acute doctor for a visit. She was given fluconazole and metronidazole and was diagnosed with trichomonas. She states that these symptoms have resolved. And her husband was treated as well. She denies any other sexual partners or new sexual contacts. She is still having some pains in her shoulder, and states that they have been worse with decreasing of her gabapentin dose by her pain doctor. She is seen at [facility] for back pain and she states that they suggested she go see pain clinic doctor. They have been starting her on topirimate and decreasing her gabapentin dosing. In the same time course she's had worsening of diabetic neuropathy symptoms in her feet and now in her shoulder. She is not currently having any other complaints at this time. She has lost 4 pounds since June which I congratulated her on. Advised her to continue exercising. States that she would like to join the gym however does not have financial resources. Did provide her with a form for scholarship to the [employer].  Review of Systems  Constitutional: Negative for fever, activity change, appetite change, fatigue and unexpected weight change.  Respiratory: Negative for cough, chest tightness, shortness of breath and wheezing.   Cardiovascular: Negative for chest pain, palpitations and leg swelling.  Gastrointestinal: Negative.   Musculoskeletal: Positive for myalgias, back pain and arthralgias. Negative for joint swelling and gait problem.  Skin: Negative.   Neurological: Negative for dizziness, tremors, seizures, syncope, facial asymmetry, speech difficulty, weakness, light-headedness, numbness and headaches.       Objective:   Physical Exam    Constitutional: She appears well-developed and well-nourished.       obese  HENT:  Head: Normocephalic and atraumatic.  Neck: Normal range of motion.  Cardiovascular: Normal rate and regular rhythm.   Pulmonary/Chest: Effort normal and breath sounds normal.  Abdominal: Soft. Bowel sounds are normal.  Musculoskeletal: She exhibits tenderness.  Neurological: No cranial nerve deficit. Coordination normal.  Skin: Skin is warm and dry.       Assessment/Plan:   1. Please see problem-oriented charting.  2. Disposition-patient will be seen back in one to 2 months after being seen in pain clinic. Will switch her medication from simvastatin 40 to Lipitor 40 for better cholesterol control. Advised her she has any problems or questions to call us in the meanwhile. Per her diabetic record her sugars are better controlled we'll not make change to her regimen at this time. Did also give her pneumonia vaccine at today's visit.

## 2012-01-13 NOTE — Telephone Encounter (Signed)
Rx faxed in.

## 2012-01-13 NOTE — Assessment & Plan Note (Signed)
Patient says she she has another herniated disc per [facility]. They may have to do epidural injections. She is attempting to lose some weight and work with pain clinic to resolve these symptoms without shots. She is currently topirimate and gabapentin.

## 2012-01-13 NOTE — Assessment & Plan Note (Addendum)
Patient is having worsening of her neuropathy symptoms since decreasing gabapentin dosing. I did advise her to ask pain clinic doctor about this. Her sugars from her meter have been more and range from 110s to 200. Will not change medical regimen at this time and will repeat hemoglobin A1c in one month when we see her back. Did give a pneumonia shot at today's visit encouraged her to go back to Dr. She states that without insurance this will be difficult and we will continue to followup with her when this is available.

## 2012-01-13 NOTE — Telephone Encounter (Signed)
Thanks, did do.   Dr. Dorise Hiss

## 2012-01-13 NOTE — Patient Instructions (Addendum)
You were seen today for a follow up of your medical problems. Continue to go to [facility] for your back pain. We are increasing the dose of your cholesterol medication from simvastatin 40 mg daily to lipitor 40 mg daily (atorvastatin). If this medication is too expensive please call us and we can make a switch. In the meantime please continue taking simvastatin until you are able to get the new medication. Continue walking every day and work on losing weight. Come back and see [place] after you see the pain doctors in mid September. We will give you the pneumonia shot today.  Pneumococcal Vaccine, Polyvalent solution for injection What is this medicine? PNEUMOCOCCAL VACCINE, POLYVALENT (NEU mo KOK al vak SEEN, pol ee VEY luhnt) is a vaccine to prevent pneumococcus bacteria infection. These bacteria are a major cause of ear infections, Strep throat infections, and serious pneumonia, meningitis, or blood infections worldwide. These vaccines help the body to produce antibodies (protective substances) that help your body defend against these bacteria. This vaccine is recommended for people 1 years of age and older with health problems. It is also recommended for all adults over 42 years old. This vaccine will not treat an infection. This medicine may be used for other purposes; ask your health care provider or pharmacist if you have questions. What should I tell my health care provider before I take this medicine? They need to know if you have any of these conditions: -bleeding problems -bone marrow or organ transplant -cancer, Hodgkin's disease -fever -infection -immune system problems -low platelet count in the blood -seizures -an unusual or allergic reaction to pneumococcal vaccine, diphtheria toxoid, other vaccines, latex, other medicines, foods, dyes, or preservatives -pregnant or trying to get pregnant -breast-feeding How should I use this medicine? This vaccine is for injection into a muscle or  under the skin. It is given by a health care professional. A copy of Vaccine Information Statements will be given before each vaccination. Read this sheet carefully each time. The sheet may change frequently. Talk to your pediatrician regarding the use of this medicine in children. While this drug may be prescribed for children as young as 4 years of age for selected conditions, precautions do apply. Overdosage: If you think you have taken too much of this medicine contact a poison control center or emergency room at once. NOTE: This medicine is only for you. Do not share this medicine with others. What if I miss a dose? It is important not to miss your dose. Call your doctor or health care professional if you are unable to keep an appointment. What may interact with this medicine? -medicines for cancer chemotherapy -medicines that suppress your immune function -medicines that treat or prevent blood clots like warfarin, enoxaparin, and dalteparin -steroid medicines like prednisone or cortisone This list may not describe all possible interactions. Give your health care provider a list of all the medicines, herbs, non-prescription drugs, or dietary supplements you use. Also tell them if you smoke, drink alcohol, or use illegal drugs. Some items may interact with your medicine. What should I watch for while using this medicine? Mild fever and pain should go away in 3 days or less. Report any unusual symptoms to your doctor or health care professional. What side effects may I notice from receiving this medicine? Side effects that you should report to your doctor or health care professional as soon as possible: -allergic reactions like skin rash, itching or hives, swelling of the face, lips, or tongue -  breathing problems -confused -fever over 102 degrees F -pain, tingling, numbness in the hands or feet -seizures -unusual bleeding or bruising -unusual muscle weakness Side effects that usually do  not require medical attention (report to your doctor or health care professional if they continue or are bothersome): -aches and pains -diarrhea -fever of 102 degrees F or less -headache -irritable -loss of appetite -pain, tender at site where injected -trouble sleeping This list may not describe all possible side effects. Call your doctor for medical advice about side effects. You may report side effects to FDA at 1-800-FDA-1088. Where should I keep my medicine? This does not apply. This vaccine is given in a clinic, pharmacy, doctor's office, or other health care setting and will not be stored at home. NOTE: This sheet is a summary. It may not cover all possible information. If you have questions about this medicine, talk to your doctor, pharmacist, or health care provider.  2012, [employer]vier/Gold Standard. (01/04/2008 2:32:37 PM)   Exercise to Lose Weight Exercise and a healthy diet may help you lose weight. Your doctor may suggest specific exercises. EXERCISE IDEAS AND TIPS  Choose low-cost things you enjoy doing, such as walking, bicycling, or exercising to workout videos.   Take stairs instead of the elevator.   Walk during your lunch break.   Park your car further away from work or school.   Go to a gym or an exercise class.   Start with 5 to 10 minutes of exercise each day. Build up to 30 minutes of exercise 4 to 6 days a week.   Wear shoes with good support and comfortable clothes.   Stretch before and after working out.   Work out until you breathe harder and your heart beats faster.   Drink extra water when you exercise.   Do not do so much that you hurt yourself, feel dizzy, or get very short of breath.  Exercises that burn about 150 calories:  Running 1  miles in 15 minutes.   Playing volleyball for 45 to 60 minutes.   Washing and waxing a car for 45 to 60 minutes.   Playing touch football for 45 minutes.   Walking 1  miles in 35 minutes.   Pushing a  stroller 1  miles in 30 minutes.   Playing basketball for 30 minutes.   Raking leaves for 30 minutes.   Bicycling 5 miles in 30 minutes.   Walking 2 miles in 30 minutes.   Dancing for 30 minutes.   Shoveling snow for 15 minutes.   Swimming laps for 20 minutes.   Walking up stairs for 15 minutes.   Bicycling 4 miles in 15 minutes.   Gardening for 30 to 45 minutes.   Jumping rope for 15 minutes.   Washing windows or floors for 45 to 60 minutes.  Document Released: 07/02/2010 Document Revised: 05/19/2011 Document Reviewed: 07/02/2010 [facility] Patient Information 2012 [place], [place].

## 2012-01-13 NOTE — Assessment & Plan Note (Signed)
Current LDL is 122 and will switch statin from simvastatin 40 mg to atorvastatin 40 mg. Will repeat lipid panel in 6-12 months.

## 2012-01-13 NOTE — Assessment & Plan Note (Signed)
Patient has lost 4 pounds since last visit in June and did encourage this weight loss. Did give her scholarship form to [facility]. Did also give her a list of exercises that help to burn more calories and help to lose weight.

## 2012-01-13 NOTE — Telephone Encounter (Signed)
Please change to flex pen !!  Refill states 5 units tic meals.

## 2012-01-13 NOTE — Assessment & Plan Note (Signed)
Blood pressure is well-controlled at today's visit: 122/71. Will not make any change to her regimen at this time. She is currently taking hydrochlorothiazide and losartan.

## 2012-02-13 DIAGNOSIS — G039 Meningitis, unspecified: Secondary | ICD-10-CM | POA: Insufficient documentation

## 2012-02-13 DIAGNOSIS — G894 Chronic pain syndrome: Secondary | ICD-10-CM | POA: Insufficient documentation

## 2012-02-15 ENCOUNTER — Other Ambulatory Visit: Payer: Self-pay | Admitting: *Deleted

## 2012-02-15 DIAGNOSIS — K219 Gastro-esophageal reflux disease without esophagitis: Secondary | ICD-10-CM

## 2012-02-15 MED ORDER — RANITIDINE HCL 150 MG PO TABS: 150.0000 mg | ORAL_TABLET | Freq: Two times a day (BID) | ORAL | Status: DC

## 2012-03-02 ENCOUNTER — Ambulatory Visit (INDEPENDENT_AMBULATORY_CARE_PROVIDER_SITE_OTHER): Payer: Self-pay | Admitting: Internal Medicine

## 2012-03-02 ENCOUNTER — Encounter: Payer: Self-pay | Admitting: Internal Medicine

## 2012-03-02 VITALS — BP 114/72 | HR 89 | Temp 98.7°F | Ht 64.0 in | Wt 244.7 lb

## 2012-03-02 DIAGNOSIS — Z79899 Other long term (current) drug therapy: Secondary | ICD-10-CM

## 2012-03-02 DIAGNOSIS — E1142 Type 2 diabetes mellitus with diabetic polyneuropathy: Secondary | ICD-10-CM

## 2012-03-02 DIAGNOSIS — K219 Gastro-esophageal reflux disease without esophagitis: Secondary | ICD-10-CM

## 2012-03-02 DIAGNOSIS — I10 Essential (primary) hypertension: Secondary | ICD-10-CM

## 2012-03-02 DIAGNOSIS — M549 Dorsalgia, unspecified: Secondary | ICD-10-CM

## 2012-03-02 DIAGNOSIS — E1149 Type 2 diabetes mellitus with other diabetic neurological complication: Secondary | ICD-10-CM

## 2012-03-02 DIAGNOSIS — Z23 Encounter for immunization: Secondary | ICD-10-CM

## 2012-03-02 DIAGNOSIS — E1140 Type 2 diabetes mellitus with diabetic neuropathy, unspecified: Secondary | ICD-10-CM

## 2012-03-02 LAB — GLUCOSE, CAPILLARY: Glucose-Capillary: 208 mg/dL — ABNORMAL HIGH (ref 70–99)

## 2012-03-02 LAB — POCT GLYCOSYLATED HEMOGLOBIN (HGB A1C): Hemoglobin A1C: 8.3

## 2012-03-02 MED ORDER — OXYCODONE-ACETAMINOPHEN 5-325 MG PO TABS: 1.0000 | ORAL_TABLET | Freq: Three times a day (TID) | ORAL | Status: DC | PRN

## 2012-03-02 MED ORDER — HYDROCHLOROTHIAZIDE 25 MG PO TABS: 25.0000 mg | ORAL_TABLET | Freq: Every day | ORAL | Status: DC

## 2012-03-02 NOTE — Patient Instructions (Signed)
You were seen for a follow up and we would like you to keep working on losing weight. We will give you a flu shot today and see you back in 2 months. Continue to see the people at [facility] about your back and ask about the topirimate and neurontin. Call us with any questions or problems at [phone].    Exercise to Lose Weight Exercise and a healthy diet may help you lose weight. Your doctor may suggest specific exercises. EXERCISE IDEAS AND TIPS  Choose low-cost things you enjoy doing, such as walking, bicycling, or exercising to workout videos.   Take stairs instead of the elevator.   Walk during your lunch break.   Park your car further away from work or school.   Go to a gym or an exercise class.   Start with 5 to 10 minutes of exercise each day. Build up to 30 minutes of exercise 4 to 6 days a week.   Wear shoes with good support and comfortable clothes.   Stretch before and after working out.   Work out until you breathe harder and your heart beats faster.   Drink extra water when you exercise.   Do not do so much that you hurt yourself, feel dizzy, or get very short of breath.  Exercises that burn about 150 calories:  Running 1  miles in 15 minutes.   Playing volleyball for 45 to 60 minutes.   Washing and waxing a car for 45 to 60 minutes.   Playing touch football for 45 minutes.   Walking 1  miles in 35 minutes.   Pushing a stroller 1  miles in 30 minutes.   Playing basketball for 30 minutes.   Raking leaves for 30 minutes.   Bicycling 5 miles in 30 minutes.   Walking 2 miles in 30 minutes.   Dancing for 30 minutes.   Shoveling snow for 15 minutes.   Swimming laps for 20 minutes.   Walking up stairs for 15 minutes.   Bicycling 4 miles in 15 minutes.   Gardening for 30 to 45 minutes.   Jumping rope for 15 minutes.   Washing windows or floors for 45 to 60 minutes.  Document Released: 07/02/2010 Document Revised: 05/19/2011 Document  Reviewed: 07/02/2010 [facility] Patient Information 2012 [place], [place].

## 2012-03-02 NOTE — Assessment & Plan Note (Signed)
She is doing better today with HgA1c of 8.3 down from 9.3. Will continue with glipizide, metformin, and novolog. We may need to add lantus in the future and stop glipizide however are working on weight loss at this time. She is currently down about 7 pounds in the last 6 months. Encouraged this weight loss and advised continued levels of activity and walking.

## 2012-03-02 NOTE — Assessment & Plan Note (Signed)
Working on weight loss and continued encouragement and support.

## 2012-03-02 NOTE — Assessment & Plan Note (Signed)
Advised to continue to follow with [facility] and neurologist. She is undergoing therapy and water aerobics with weight loss as goal and may still need surgery in the future.

## 2012-03-02 NOTE — Progress Notes (Signed)
Subjective:     Patient ID: [name], female   DOB: 1961/06/09, 51 y.o.   MRN: [id number]  HPI The patient is a 51 YO female who comes in today for a check up of her diabetes, back pain, and health maintenance. She had back injection a couple of weeks ago for her back at [facility] and states that the pain is better but she is still having some swelling and numbness. They would like her to do physical therapy and water aerobics and are arranging that. They have also encouraged her to lose weight. She is doing well and taking 3 classes at school right now. She is not having any low sugars and is managing to take her novolog regularly. She continues to take glipizide and metformin. She is not having any other complaints at today's visit.   Review of Systems  Constitutional: Negative for fever, chills, diaphoresis, activity change, appetite change, fatigue and unexpected weight change.  HENT: Negative.   Eyes: Negative.   Respiratory: Negative for cough, chest tightness, shortness of breath and wheezing.   Cardiovascular: Negative for chest pain, palpitations and leg swelling.  Gastrointestinal: Negative for nausea, vomiting, abdominal pain, diarrhea, constipation and abdominal distention.  Musculoskeletal: Positive for myalgias, back pain and arthralgias. Negative for joint swelling and gait problem.  Skin: Negative for color change, pallor, rash and wound.  Neurological: Positive for numbness. Negative for dizziness, tremors, seizures, syncope, facial asymmetry, speech difficulty, weakness, light-headedness and headaches.  Hematological: Negative for adenopathy. Does not bruise/bleed easily.  Psychiatric/Behavioral: Negative.        Objective:   Physical Exam  Constitutional: She is oriented to person, place, and time. She appears well-developed and well-nourished.       Obese  HENT:  Head: Normocephalic and atraumatic.  Eyes: EOM are normal. Pupils are equal, round, and reactive to  light.  Neck: Normal range of motion. Neck supple.  Cardiovascular: Normal rate and regular rhythm.   Pulmonary/Chest: Effort normal and breath sounds normal. No respiratory distress. She has no wheezes.  Abdominal: Soft. Bowel sounds are normal. She exhibits no distension. There is no tenderness. There is no rebound.  Musculoskeletal: Normal range of motion. She exhibits tenderness. She exhibits no edema.  Neurological: She is alert and oriented to person, place, and time. No cranial nerve deficit.       Some abnormal sensation and tingling on her lower extremities.  Skin: Skin is warm and dry.  Psychiatric: She has a normal mood and affect. Her behavior is normal. Judgment and thought content normal.       Assessment/Plan:   1. Please see problem oriented charting.  2. Disposition - The patient will be seen back in 2 months. She was given flu vaccine and foot exam at today's visit. HgA1c is better at today's visit and continued encouragement of weight loss.

## 2012-03-21 ENCOUNTER — Telehealth: Payer: Self-pay | Admitting: *Deleted

## 2012-03-21 ENCOUNTER — Ambulatory Visit (INDEPENDENT_AMBULATORY_CARE_PROVIDER_SITE_OTHER): Payer: Self-pay | Admitting: Internal Medicine

## 2012-03-21 ENCOUNTER — Encounter: Payer: Self-pay | Admitting: Internal Medicine

## 2012-03-21 VITALS — BP 104/69 | HR 96 | Temp 98.3°F | Ht 64.0 in | Wt 242.8 lb

## 2012-03-21 DIAGNOSIS — L089 Local infection of the skin and subcutaneous tissue, unspecified: Secondary | ICD-10-CM | POA: Insufficient documentation

## 2012-03-21 DIAGNOSIS — E1140 Type 2 diabetes mellitus with diabetic neuropathy, unspecified: Secondary | ICD-10-CM

## 2012-03-21 DIAGNOSIS — B958 Unspecified staphylococcus as the cause of diseases classified elsewhere: Secondary | ICD-10-CM

## 2012-03-21 LAB — GLUCOSE, CAPILLARY: Glucose-Capillary: 224 mg/dL — ABNORMAL HIGH (ref 70–99)

## 2012-03-21 MED ORDER — SULFAMETHOXAZOLE-TRIMETHOPRIM 800-160 MG PO TABS: 1.0000 | ORAL_TABLET | Freq: Two times a day (BID) | ORAL | Status: AC

## 2012-03-21 MED ORDER — DIPHENHYDRAMINE HCL 25 MG PO CAPS: 25.0000 mg | ORAL_CAPSULE | Freq: Four times a day (QID) | ORAL | Status: DC | PRN

## 2012-03-21 NOTE — Patient Instructions (Signed)
Your symptoms are likely due to a Staph infection. -Take the antibiotic Bactrim, 1 tablet twice per day for 10 days -For itching, take Benadryl, 1 tablet up to every 6 hours -If the areas do not improve within 1 week, please call our clinic for further instructions  Please return for your regularly scheduled diabetes follow-up with Dr. Dorise Hiss

## 2012-03-21 NOTE — Assessment & Plan Note (Addendum)
The patient presents with multiple erythematous plaques, consistent with superficial staph infection.  Other differential includes "bed bug" bites.  Unlikely psoriasis (similar appearance, though without scale, and acute in onset with obvious trigger) vs tick-bourne illness (uncharacteristic appearance of erythema nodosum, afebrile here in clinic). -treat with bactrim DS BID x10 days -benadryl for itching -if areas do not improve, will re-evaluate for other potential causes listed above -note written for work to discard loveseat

## 2012-03-21 NOTE — Progress Notes (Signed)
HPI The patient is a 51 y.o. yo female with a history of DM, HTN, HL, presenting for an acute visit for skin lesions.  The patient notes a 3-day history of erythematous, itchy lesions.  She notes sleeping on a love seat at work Saturday night (which she notes most people usually avoid), then awoke with 6-10 lesions on various parts of her exposed skin.  She notes no other unusual exposure, including outdoor exposure, known tick bites, new hygiene products, new pets, or sick contacts with similar symptoms.  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  The areas have not been draining exudate.  The patient note subjective fevers since the onset of symptoms.  No dyspnea, SOB.  ROS: General: no changes in weight, changes in appetite Skin: see HPI HEENT: no blurry vision, hearing changes, sore throat Pulm: no dyspnea, coughing, wheezing CV: no chest pain, palpitations, shortness of breath Abd: no abdominal pain, nausea/vomiting, diarrhea/constipation GU: no dysuria, hematuria, polyuria Ext: no arthralgias, myalgias Neuro: no weakness, numbness, or tingling  Filed Vitals:   03/21/12 1600  BP: 104/69  Pulse: 96  Temp: 98.3 F (36.8 C)    PEX General: alert, cooperative, and in no apparent distress HEENT: pupils equal round and reactive to light, vision grossly intact, oropharynx clear and non-erythematous  Neck: supple, no lymphadenopathy Lungs: clear to ascultation bilaterally, normal work of respiration, no wheezes, rales, ronchi Heart: regular rate and rhythm, no murmurs, gallops, or rubs Abdomen: soft, non-tender, non-distended, normal bowel sounds Extremities: large 3-6 cm well-circumscribed erythematous plaques scattered throughout arms and legs, with no associated scale or exudate Neurologic: alert & oriented X3, cranial nerves II-XII intact, strength grossly intact, sensation intact to light touch  Current Outpatient Prescriptions on File Prior to Visit  Medication Sig Dispense  Refill  . aspirin 81 MG EC tablet Take 81 mg by mouth daily.        Marland Kitchen atorvastatin (LIPITOR) 40 MG tablet Take 1 tablet (40 mg total) by mouth daily.  30 tablet  11  . Calcium Carbonate-Vitamin D (CALCIUM 600+D) 600-400 MG-UNIT per tablet Take 1 tablet by mouth daily.        . celecoxib (CELEBREX) 200 MG capsule Take 1 capsule (200 mg total) by mouth daily. May take 1 capsule twice daily for acute pain.  30 capsule  5  . cyclobenzaprine (FLEXERIL) 10 MG tablet Take 1 tablet (10 mg total) by mouth 3 (three) times daily as needed for muscle spasms.  30 tablet  1  . esomeprazole (NEXIUM) 20 MG capsule Take 1 capsule (20 mg total) by mouth daily.  90 capsule  3  . gabapentin (NEURONTIN) 800 MG tablet Take 1 tablet (800 mg total) by mouth 3 (three) times daily.  90 tablet  3  . glipiZIDE (GLUCOTROL) 10 MG tablet Take 1 tablet (10 mg total) by mouth daily.  30 tablet  11  . hydrochlorothiazide (HYDRODIURIL) 25 MG tablet Take 1 tablet (25 mg total) by mouth daily.  30 tablet  5  . insulin aspart (NOVOLOG FLEXPEN) 100 UNIT/ML injection Use to inject 10 units under skin before meals three times a day. Dx code 250.00  3 mL  12  . losartan (COZAAR) 50 MG tablet TAKE ONE TABLET BY MOUTH EVERY DAY  30 tablet  6  . metFORMIN (GLUCOPHAGE) 1000 MG tablet Take 1 tablet (1,000 mg total) by mouth 2 (two) times daily with a meal.  60 tablet  6  . Multiple Vitamin (MULTIVITAMIN) capsule Take  1 capsule by mouth daily.        Marland Kitchen oxyCODONE-acetaminophen (PERCOCET/ROXICET) 5-325 MG per tablet Take 1 tablet by mouth every 8 (eight) hours as needed for pain.  90 tablet  0  . ranitidine (ZANTAC) 150 MG tablet Take 1 tablet (150 mg total) by mouth 2 (two) times daily.  60 tablet  6  . RELION PEN NEEDLE 31G/8MM 31G X 8 MM MISC USE AS DIRECTED  100 each  10  . topiramate (TOPAMAX) 50 MG tablet Take 50 mg by mouth See admin instructions. 50 mg qhs x 5 days, then 100 mg x 5 days (pt currently taking), then 150 mg qhs x 5 days, then  50 mg qam and 150 mg qpm  This is prescribed by a pain specialist in [place]      . DISCONTD: insulin glargine (LANTUS) 100 UNIT/ML injection Inject 85 Units into the skin at bedtime. Please dispense 1 box of Lantus pens      . DISCONTD: simvastatin (ZOCOR) 40 MG tablet TAKE ONE TABLET BY MOUTH EVERY DAY  60 tablet  6    Assessment/Plan

## 2012-03-21 NOTE — Telephone Encounter (Signed)
Pt calling from Pain Center in [facility] - was bitten multi times over weekend ? Spider. Has area left lower leg that has red area - pt states pain center states needs to see PCP. Having periods chills, SOB and chest pains. Pt is comfortable while on phone. If chest pain and or SOB occur again - suggest to go to ER. Otherwise to see pt 03/21/12 2:30PM Dr Manson Passey. Stanton Kidney Burhan Barham RN 03/21/12 11:55AM.

## 2012-03-22 ENCOUNTER — Ambulatory Visit: Payer: Self-pay | Admitting: Internal Medicine

## 2012-04-02 ENCOUNTER — Other Ambulatory Visit: Payer: Self-pay | Admitting: *Deleted

## 2012-04-02 DIAGNOSIS — E1140 Type 2 diabetes mellitus with diabetic neuropathy, unspecified: Secondary | ICD-10-CM

## 2012-04-03 MED ORDER — GABAPENTIN 800 MG PO TABS: 800.0000 mg | ORAL_TABLET | Freq: Three times a day (TID) | ORAL | Status: DC

## 2012-04-05 ENCOUNTER — Telehealth: Payer: Self-pay | Admitting: *Deleted

## 2012-04-05 MED ORDER — ACYCLOVIR 400 MG PO TABS: 400.0000 mg | ORAL_TABLET | Freq: Three times a day (TID) | ORAL | Status: DC

## 2012-04-05 NOTE — Telephone Encounter (Signed)
Pt not on suppressive treatment - just episodic tx. Fill for 400 TID to increase compliance for 7 days. No refills

## 2012-04-05 NOTE — Telephone Encounter (Signed)
Pt called and would like to get meds at [facility] so will need acyclovir Dr Dorise Hiss approved for one month with no refills.  Pt will need to be seen for any more.

## 2012-04-05 NOTE — Telephone Encounter (Signed)
We have only addressed it once in Feb 2012. Please call Dr Dorise Hiss - PCP - and get her input on the appropriateness of the refill since it has not been addressed for 1.5 yrs.

## 2012-04-05 NOTE — Telephone Encounter (Signed)
Called to pharm 

## 2012-04-05 NOTE — Telephone Encounter (Signed)
Rx called into [facility] 

## 2012-04-05 NOTE — Telephone Encounter (Signed)
Pt called asking for a refill of valtrex.  She states she gets it as needed. Med is not on med list but in past record. I called [facility] and pt last got refill on 11/17/11 from  Larey Dresser, NP  She has been getting it PRN for genital herpes.  Will you refill? [facility] can get acyclovir but not valtrex. If valtrex is needed pt can get at [facility].  Not sure who PCP is.

## 2012-05-02 ENCOUNTER — Encounter: Payer: Self-pay | Admitting: Internal Medicine

## 2012-05-02 ENCOUNTER — Ambulatory Visit (INDEPENDENT_AMBULATORY_CARE_PROVIDER_SITE_OTHER): Payer: Medicaid Other | Admitting: Internal Medicine

## 2012-05-02 VITALS — BP 142/81 | HR 94 | Temp 97.8°F | Ht 63.5 in | Wt 242.1 lb

## 2012-05-02 DIAGNOSIS — E785 Hyperlipidemia, unspecified: Secondary | ICD-10-CM

## 2012-05-02 DIAGNOSIS — E1149 Type 2 diabetes mellitus with other diabetic neurological complication: Secondary | ICD-10-CM

## 2012-05-02 DIAGNOSIS — E1142 Type 2 diabetes mellitus with diabetic polyneuropathy: Secondary | ICD-10-CM

## 2012-05-02 DIAGNOSIS — M79669 Pain in unspecified lower leg: Secondary | ICD-10-CM | POA: Insufficient documentation

## 2012-05-02 DIAGNOSIS — M79609 Pain in unspecified limb: Secondary | ICD-10-CM

## 2012-05-02 DIAGNOSIS — E1140 Type 2 diabetes mellitus with diabetic neuropathy, unspecified: Secondary | ICD-10-CM

## 2012-05-02 DIAGNOSIS — J069 Acute upper respiratory infection, unspecified: Secondary | ICD-10-CM

## 2012-05-02 LAB — GLUCOSE, CAPILLARY: Glucose-Capillary: 227 mg/dL — ABNORMAL HIGH (ref 70–99)

## 2012-05-02 MED ORDER — INSULIN GLARGINE 100 UNIT/ML ~~LOC~~ SOLN: 75.0000 [IU] | Freq: Every day | SUBCUTANEOUS | Status: DC

## 2012-05-02 NOTE — Progress Notes (Addendum)
Subjective:    Patient ID: [name], female    DOB: 11-14-1960, 51 y.o.   MRN: [id number]  HPI Comments: 51 y.o woman PMH herpes, HLD (LDL 123 11/2011 TC 198 TG 183), obesity, HTN, GERD, back pain, DM with neuropathy (HA1C 8.3 02/2012).   She presents for feeling sick since Friday prior to visit.  She was exposed to her family who were sick and came to her house Friday.  Since then she developed late Friday night early Saturday morning fever (100 F Sat), chills, thick yellow to white sputum with min. Tingle of red blood once (denies hemoptysis), she felt a sensation in her throat like her throat was irritated.  She has been coughing to the point of gagging and she even vomited. She reports chest/abdominal pain with coughing, shortness of breath, runny nose, nasal congestion, decreased appetite.  She has tried Mucinex DM, Tylenol, Advil, cough drops with Menthol and Cepacol and does not notice relief.    She reports hypoglycemic episodes at home.  Meter readings show morning low readings of 38 and 65.  She has not been eating as much since sick and still taking her DM medications (Lantus 85 units qhs) and Glipizide is on her medication list and she reports 10 mg qd but it appears as though it is expired on her medication list.    She complains of pain in her right calf noticed last night.  Her husband tried to massage the area but it is tender to touch and feels like a knot is present.  She denies leg swelling.   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   URI  This is a new problem. The current episode started in the past 7 days. The problem has been gradually worsening. The maximum temperature recorded prior to her arrival was 100 - 100.9 F. The fever has been present for less than 1 day. Associated symptoms include congestion, coughing, nausea, rhinorrhea, vomiting and wheezing. Pertinent negatives include no diarrhea  or sore throat. She has tried acetaminophen and NSAIDs (mucinex DM) for the symptoms. The treatment provided no relief.  Cough Associated symptoms include rhinorrhea and wheezing. Pertinent negatives include no sore throat.      Review of Systems  HENT: Positive for congestion and rhinorrhea. Negative for sore throat.   Respiratory: Positive for cough and wheezing.   Cardiovascular: Negative for leg swelling.  Gastrointestinal: Positive for nausea and vomiting. Negative for diarrhea and constipation.       Objective:   Physical Exam  Nursing note and vitals reviewed. Constitutional: She is oriented to person, place, and time. She appears well-developed and well-nourished. She is cooperative. No distress.  HENT:  Head: Normocephalic and atraumatic.  Mouth/Throat: Oropharynx is clear and moist. No oropharyngeal exudate.       Sniffling Mask on face  Eyes: Conjunctivae normal are normal. Pupils are equal, round, and reactive to light. Right eye exhibits no discharge. Left eye exhibits no discharge. No scleral icterus.  Cardiovascular: S1 normal, S2 normal and normal heart sounds.  Tachycardia present.   No murmur heard. Pulmonary/Chest: Effort normal and breath sounds normal. No respiratory distress. She has no wheezes. She exhibits tenderness.  Abdominal: Soft. Bowel sounds are normal. She exhibits no distension. There is tenderness.       Mild MSK ttp upper abdomen Obese ab  Neurological: She is alert and oriented to person, place, and  time. Gait normal.  Skin: Skin is warm, dry and intact. No rash noted. She is not diaphoretic.     Psychiatric: She has a normal mood and affect. Her speech is normal and behavior is normal. Judgment and thought content normal.       Appears dishelved today           Assessment & Plan:  RTC 1 week hypoglycemia and URI  INTERNAL MEDICINE TEACHING ATTENDING ADDENDUM - Lars Mage, MD: I personally saw and evaluated Ms Stoermer in this clinic visit  in conjunction with the resident, Dr. Shirlee Latch. I have discussed the patient's plan of care with Dr. Shirlee Latch during this visit. I have confirmed the physical exam findings and have read and agree with the clinic note including the plan.

## 2012-05-02 NOTE — Assessment & Plan Note (Signed)
Likely given sick contacts Advise continue sx'matic tx no antibiotics at this time Can continue Mucinex DM, cough drops (sugar free), Tylenol/Advil

## 2012-05-02 NOTE — Assessment & Plan Note (Signed)
Pt experiencing hypoglycemic episodes on meter x 2 (30s and 60s) though fsbs 227 today and she is eating strawberry almonds She is not eating due to acute illness and still taking her DM medications It is unclear what medications she is taking b/c I just updated her medication list.   She stated she was taking Lantus 85 units which was not on her med list prior to this visit and her Glipizide is expired but when as she is taking 10 mg qd  Advised to not skip meals, take Lantus 75 units qhs

## 2012-05-02 NOTE — Assessment & Plan Note (Signed)
Appears like a vessel, mild ttp No edema in b/l lower extremities

## 2012-05-02 NOTE — Assessment & Plan Note (Signed)
LDL not at goal and she stopped statin PCP should discuss at next visit

## 2012-05-02 NOTE — Patient Instructions (Addendum)
Please continue to take Mucinex DM, cough drops (sugar free), Tylenol or Advil for pain Decrease your Lantus to 75 units  Revisit in 1 week for follow up with Shirlee Latch  Low Blood Sugar Low blood sugar (hypoglycemia) means that the level of sugar in your blood is lower than it should be. Signs of low blood sugar include:  Getting sweaty.  Feeling hungry.  Feeling dizzy or weak.  Feeling sleepier than normal.  Feeling nervous.  Headaches.  Having a fast heartbeat. Low blood sugar can happen fast and can be an emergency. Your doctor can do tests to check your blood sugar level. You can have low blood sugar and not have diabetes. HOME CARE  Check your blood sugar as told by your doctor. If it is less than 70 mg/dl or as told by your doctor, take 1 of the following:  3 to 4 glucose tablets.   cup clear juice.   cup soda pop, not diet.  1 cup milk.  5 to 6 hard candies.  Recheck blood sugar after 15 minutes. Repeat until it is at the right level.  Eat a snack if it is more than 1 hour until the next meal.  Only take medicine as told by your doctor.  Do not skip meals. Eat on time.  Do not drink alcohol except with meals.  Check your blood glucose before driving.  Check your blood glucose before and after exercise.  Always carry treatment with you, such as glucose pills.  Always wear a medical alert bracelet if you have diabetes. GET HELP RIGHT AWAY IF:   Your blood glucose goes below 70 mg/dl or as told by your doctor, and you:  Are confused.  Are not able to swallow.  Pass out (faint).  You cannot treat yourself. You may need someone to help you.  You have low blood sugar problems often.  You have problems from your medicines.  You are not feeling better after 3 to 4 days.  You have vision changes. MAKE SURE YOU:   Understand these instructions.  Will watch this condition.  Will get help right away if you are not doing well or get  worse. Document Released: 08/24/2009 Document Revised: 08/22/2011 Document Reviewed: 08/24/2009 [facility] Patient Information 2013 [place], [place].  Upper Respiratory Infection, Adult An upper respiratory infection (URI) is also known as the common cold. It is often caused by a type of germ (virus). Colds are easily spread (contagious). You can pass it to others by kissing, coughing, sneezing, or drinking out of the same glass. Usually, you get better in 1 or 2 weeks.  HOME CARE   Only take medicine as told by your doctor.  Use a warm mist humidifier or breathe in steam from a hot shower.  Drink enough water and fluids to keep your pee (urine) clear or pale yellow.  Get plenty of rest.  Return to work when your temperature is back to normal or as told by your doctor. You may use a face mask and wash your hands to stop your cold from spreading. GET HELP RIGHT AWAY IF:   After the first few days, you feel you are getting worse.  You have questions about your medicine.  You have chills, shortness of breath, or brown or red spit (mucus).  You have yellow or brown snot (nasal discharge) or pain in the face, especially when you bend forward.  You have a fever, puffy (swollen) neck, pain when you swallow, or white spots  in the back of your throat.  You have a bad headache, ear pain, sinus pain, or chest pain.  You have a high-pitched whistling sound when you breathe in and out (wheezing).  You have a lasting cough or cough up blood.  You have sore muscles or a stiff neck. MAKE SURE YOU:   Understand these instructions.  Will watch your condition.  Will get help right away if you are not doing well or get worse. Document Released: 11/16/2007 Document Revised: 08/22/2011 Document Reviewed: 10/04/2010 [facility] Patient Information 2013 [place], [place].

## 2012-05-04 ENCOUNTER — Encounter: Payer: Self-pay | Admitting: Internal Medicine

## 2012-05-04 ENCOUNTER — Ambulatory Visit (INDEPENDENT_AMBULATORY_CARE_PROVIDER_SITE_OTHER): Payer: Self-pay | Admitting: Internal Medicine

## 2012-05-04 VITALS — BP 130/82 | HR 67 | Temp 97.6°F | Ht 63.5 in | Wt 244.2 lb

## 2012-05-04 DIAGNOSIS — E1142 Type 2 diabetes mellitus with diabetic polyneuropathy: Secondary | ICD-10-CM

## 2012-05-04 DIAGNOSIS — M549 Dorsalgia, unspecified: Secondary | ICD-10-CM

## 2012-05-04 DIAGNOSIS — E1140 Type 2 diabetes mellitus with diabetic neuropathy, unspecified: Secondary | ICD-10-CM

## 2012-05-04 DIAGNOSIS — J069 Acute upper respiratory infection, unspecified: Secondary | ICD-10-CM

## 2012-05-04 DIAGNOSIS — I10 Essential (primary) hypertension: Secondary | ICD-10-CM

## 2012-05-04 DIAGNOSIS — E785 Hyperlipidemia, unspecified: Secondary | ICD-10-CM

## 2012-05-04 DIAGNOSIS — E1149 Type 2 diabetes mellitus with other diabetic neurological complication: Secondary | ICD-10-CM

## 2012-05-04 MED ORDER — AZITHROMYCIN 250 MG PO TABS: ORAL_TABLET | ORAL | Status: AC

## 2012-05-04 MED ORDER — PRAVASTATIN SODIUM 40 MG PO TABS: 40.0000 mg | ORAL_TABLET | Freq: Every evening | ORAL | Status: DC

## 2012-05-04 NOTE — Assessment & Plan Note (Signed)
Somehow statin was stopped and will start pravastatin 40 mg daily which is on $4 plan. She is to call with questions or concerns.

## 2012-05-04 NOTE — Assessment & Plan Note (Signed)
Blood pressure controlled even given recent use of mucinex DM. Continue HCTZ and losartan.

## 2012-05-04 NOTE — Assessment & Plan Note (Signed)
Stable and taking 85 units of lantus with novolog with meals. She is also taking glipizide and metformin. She has not had any more hypoglycemic episodes and will follow up with HgA1c in 1 month.

## 2012-05-04 NOTE — Assessment & Plan Note (Signed)
Will rx z-pack as this has been going on 8 days now and is getting worse rather than better.

## 2012-05-04 NOTE — Progress Notes (Signed)
Subjective:     Patient ID: [name], female   DOB: 13-Jun-1961, 51 y.o.   MRN: [id number]  HPI The patient is a 51 YO female who comes in today for a follow up visit of her diabetes. She was seen acutely 2 days ago and diagnosed with URI. She is feeling worse today and not better. She states she has had fevers up to 100 at home and still having congestion and cough with drainage. She has been taking cough drops, muccinex DM at home with no to minimal relief. She is not having calf pain anymore. She is still taking her same insulin regimen and states the the hypoglycemic episodes from two days ago were due to skipping meals due to being too busy at school. She has not had any problems since then and states that her sugars go up fast if she changes her regimen and did not follow medical advice to decrease at last visit. No other complaints at today's visit.   Review of Systems  Constitutional: Negative for fever, chills, diaphoresis, activity change, appetite change, fatigue and unexpected weight change.  HENT: Positive for congestion, sore throat, rhinorrhea and sinus pressure. Negative for hearing loss, ear pain, nosebleeds, facial swelling, sneezing, drooling, mouth sores, trouble swallowing, neck pain, neck stiffness, dental problem, voice change, postnasal drip, tinnitus and ear discharge.   Eyes: Negative for photophobia, pain, discharge, redness, itching and visual disturbance.  Respiratory: Positive for cough. Negative for apnea, choking, chest tightness, shortness of breath, wheezing and stridor.   Cardiovascular: Negative.  Negative for chest pain, palpitations and leg swelling.  Gastrointestinal: Negative.  Negative for nausea, vomiting, diarrhea, constipation and abdominal distention.  Genitourinary: Negative.   Musculoskeletal: Positive for back pain.       Back pain with excessive coughing.   Skin: Negative.   Neurological: Negative for dizziness, tremors, seizures, syncope, facial  asymmetry, speech difficulty, weakness, light-headedness, numbness and headaches.  Hematological: Negative.   Psychiatric/Behavioral: Negative.        Objective:   Physical Exam  Constitutional: She is oriented to person, place, and time. She appears well-developed and well-nourished.  HENT:  Head: Normocephalic and atraumatic.       Lots of drainage and some redness of the pharynx but no purulent drainage.   Eyes: EOM are normal. Pupils are equal, round, and reactive to light.  Neck: Normal range of motion. Neck supple. No JVD present. No thyromegaly present.  Cardiovascular: Normal rate.   No murmur heard. Pulmonary/Chest: Effort normal and breath sounds normal. No respiratory distress. She has no wheezes. She has no rales. She exhibits no tenderness.  Abdominal: Soft. Bowel sounds are normal. She exhibits no distension. There is no tenderness. There is no rebound.  Musculoskeletal: Normal range of motion. She exhibits no edema and no tenderness.  Neurological: She is alert and oriented to person, place, and time. No cranial nerve deficit.  Skin: Skin is warm and dry. No erythema.       Assessment/Plan:   1. Please see problem oriented charting.   2. Disposition - The patient was given rx for azithromycin and for pravastatin 40 mg daily. Advised to call us if cold does not improve in 1-2 weeks and if she is having muscle pain or problems. She will be seen back in 1 month or after the holidays.

## 2012-05-04 NOTE — Patient Instructions (Signed)
We will give you a medicine for your cold called azithromycin. Take 2 pills today then 1 pill per day after that until they are gone. We will give you a cholesterol medicine called pravastatin (pravachol) which is $4 and is taken once daily. Come back in 1 month to get your diabetes checked.

## 2012-05-04 NOTE — Assessment & Plan Note (Signed)
Stable and continued weight loss encouraged. Slightly exacerbated by excessive coughing and hopefully will clear with clearance of cold.

## 2012-05-14 ENCOUNTER — Other Ambulatory Visit: Payer: Self-pay | Admitting: *Deleted

## 2012-05-15 MED ORDER — GLIPIZIDE 10 MG PO TABS: 10.0000 mg | ORAL_TABLET | Freq: Every day | ORAL | Status: DC

## 2012-05-15 NOTE — Telephone Encounter (Signed)
Rx faxed in.

## 2012-06-09 ENCOUNTER — Emergency Department (HOSPITAL_COMMUNITY)
Admission: EM | Admit: 2012-06-09 | Discharge: 2012-06-09 | Disposition: A | Discharge status: Home / Self Care | Payer: Self-pay | Source: Home / Self Care | Attending: Family Medicine | Admitting: Family Medicine

## 2012-06-09 ENCOUNTER — Encounter (HOSPITAL_COMMUNITY): Payer: Self-pay | Admitting: *Deleted

## 2012-06-09 DIAGNOSIS — J329 Chronic sinusitis, unspecified: Secondary | ICD-10-CM

## 2012-06-09 MED ORDER — IPRATROPIUM BROMIDE 0.06 % NA SOLN: 2.0000 | Freq: Four times a day (QID) | NASAL | Status: DC

## 2012-06-09 MED ORDER — AZITHROMYCIN 250 MG PO TABS: ORAL_TABLET | ORAL | Status: DC

## 2012-06-09 NOTE — ED Provider Notes (Signed)
History     CSN: [id number]  Arrival date & time 06/09/12  1110   First MD Initiated Contact with Patient 06/09/12 1119      Chief Complaint  Patient presents with  . Cough    (Consider location/radiation/quality/duration/timing/severity/associated sxs/prior treatment) Patient is a 51 y.o. female presenting with cough. The history is provided by the patient.  Cough This is a new problem. The current episode started more than 2 days ago. The problem has been gradually worsening. The cough is productive of sputum. Associated symptoms include rhinorrhea and sore throat. Pertinent negatives include no chest pain and no chills. She is not a smoker.    Past Medical History  Diagnosis Date  . Chronic back pain   . Hyperlipidemia   . Hypertension   . Uterine fibroid     s/p hysterectomy  . Cocaine abuse     in remission  . Tobacco abuse   . Chronic leg pain     due to back pain  . Diabetes     with neuropathy    Past Surgical History  Procedure Date  . Total abdominal hysterectomy 05/24/2007    hysterectomy  . Breast reduction surgery 1982  . Tubal ligation   . Tonsillectomy 2008  . Back surgery 2012    L5-S1 microendoscopic disectomy last surgery 06/2011    Family History  Problem Relation Age of Onset  . Diabetes Father   . Heart disease Father   . Hypertension Father   . Diabetes Mother   . Cancer Mother     brain  . Hypertension Mother     History  Substance Use Topics  . Smoking status: Former Smoker    Quit date: 07/23/1998  . Smokeless tobacco: Never Used  . Alcohol Use: Not on file     Comment: recovering addict clean for 5 years    OB History    Grav Para Term Preterm Abortions TAB SAB Ect Mult Living   5    2 2    3       Review of Systems  Constitutional: Negative.  Negative for chills.  HENT: Positive for congestion, sore throat, rhinorrhea and postnasal drip.   Respiratory: Positive for cough.   Cardiovascular: Negative for chest pain.    Gastrointestinal: Negative.   Genitourinary: Negative.     Allergies  Review of patient's allergies indicates no known allergies.  Home Medications   Current Outpatient Rx  Name  Route  Sig  Dispense  Refill  . ACYCLOVIR 400 MG PO TABS   Oral   Take 1 tablet (400 mg total) by mouth 3 (three) times daily.   28 tablet   0   . ASPIRIN 81 MG PO TBEC   Oral   Take 81 mg by mouth daily.           . AZITHROMYCIN 250 MG PO TABS      Take as directed on pack   6 each   0   . CALCIUM CARBONATE-VITAMIN D 600-400 MG-UNIT PO TABS   Oral   Take 1 tablet by mouth daily.           . CELECOXIB 200 MG PO CAPS   Oral   Take 1 capsule (200 mg total) by mouth daily. May take 1 capsule twice daily for acute pain.   30 capsule   5   . CYCLOBENZAPRINE HCL 10 MG PO TABS   Oral   Take 1 tablet (10 mg total) by  mouth 3 (three) times daily as needed for muscle spasms.   30 tablet   1   . ESOMEPRAZOLE MAGNESIUM 20 MG PO CPDR   Oral   Take 1 capsule (20 mg total) by mouth daily.   90 capsule   3   . GABAPENTIN 800 MG PO TABS   Oral   Take 1 tablet (800 mg total) by mouth 3 (three) times daily.   90 tablet   6   . GLIPIZIDE 10 MG PO TABS   Oral   Take 1 tablet (10 mg total) by mouth daily.   30 tablet   11   . HYDROCHLOROTHIAZIDE 25 MG PO TABS   Oral   Take 1 tablet (25 mg total) by mouth daily.   30 tablet   5   . INSULIN ASPART 100 UNIT/ML [place] SOLN      Use to inject 10 units under skin before meals three times a day. Dx code 250.00   3 mL   12   . INSULIN GLARGINE 100 UNIT/ML [place] SOLN   Subcutaneous   Inject 75 Units into the skin at bedtime.   10 mL      . IPRATROPIUM BROMIDE 0.06 % NA SOLN   Nasal   Place 2 sprays into the nose 4 (four) times daily.   15 mL   1   . LOSARTAN POTASSIUM 50 MG PO TABS      TAKE ONE TABLET BY MOUTH EVERY DAY   30 tablet   6   . METFORMIN HCL 1000 MG PO TABS   Oral   Take 1 tablet (1,000 mg total) by mouth 2 (two)  times daily with a meal.   60 tablet   6   . MULTIVITAMINS PO CAPS   Oral   Take 1 capsule by mouth daily.           . OXYCODONE-ACETAMINOPHEN 5-325 MG PO TABS   Oral   Take 1 tablet by mouth every 8 (eight) hours as needed for pain.   90 tablet   0   . PRAVASTATIN SODIUM 40 MG PO TABS   Oral   Take 1 tablet (40 mg total) by mouth every evening.   30 tablet   11   . RANITIDINE HCL 150 MG PO TABS   Oral   Take 1 tablet (150 mg total) by mouth 2 (two) times daily.   60 tablet   6   . RELION PEN NEEDLES 31G X 8 MM MISC      USE AS DIRECTED   100 each   10   . TOPIRAMATE 50 MG PO TABS   Oral   Take 50 mg by mouth See admin instructions. 50 mg qhs x 5 days, then 100 mg x 5 days (pt currently taking), then 150 mg qhs x 5 days, then 50 mg qam and 150 mg qpm  This is prescribed by a pain specialist in [name]           BP 129/61  Pulse 78  Temp 98.3 F (36.8 C) (Oral)  Resp 18  SpO2 100%  Physical Exam  Nursing note and vitals reviewed. Constitutional: She is oriented to person, place, and time. She appears well-developed and well-nourished.  HENT:  Head: Normocephalic.  Right Ear: External ear normal.  Left Ear: External ear normal.  Nose: Mucosal edema and rhinorrhea present. Right sinus exhibits maxillary sinus tenderness and frontal sinus tenderness. Left sinus exhibits maxillary sinus tenderness and  frontal sinus tenderness.  Mouth/Throat: Oropharynx is clear and moist.  Neck: Normal range of motion. Neck supple.  Cardiovascular: Normal rate, regular rhythm, normal heart sounds and intact distal pulses.   Pulmonary/Chest: Effort normal and breath sounds normal.  Musculoskeletal: She exhibits no edema.  Lymphadenopathy:    She has no cervical adenopathy.  Neurological: She is alert and oriented to person, place, and time.  Skin: Skin is warm and dry.    ED Course  Procedures (including critical care time)  Labs Reviewed - No data to  display No results found.   1. Sinusitis       MDM          Linna Hoff, MD 06/09/12 1158

## 2012-06-09 NOTE — ED Notes (Signed)
Cough       Sinus  Congestion     Shortness  Of  Breath  Headache         Mucous     Production         Is   Yellow  Thick             And  Is   Productive

## 2012-06-14 ENCOUNTER — Other Ambulatory Visit: Payer: Self-pay | Admitting: *Deleted

## 2012-06-14 DIAGNOSIS — M549 Dorsalgia, unspecified: Secondary | ICD-10-CM

## 2012-06-15 ENCOUNTER — Ambulatory Visit (HOSPITAL_COMMUNITY)
Admission: RE | Admit: 2012-06-15 | Discharge: 2012-06-15 | Disposition: A | Discharge status: Home / Self Care | Payer: Self-pay | Source: Ambulatory Visit | Attending: Internal Medicine | Admitting: Internal Medicine

## 2012-06-15 ENCOUNTER — Ambulatory Visit (INDEPENDENT_AMBULATORY_CARE_PROVIDER_SITE_OTHER): Payer: Self-pay | Admitting: Internal Medicine

## 2012-06-15 ENCOUNTER — Encounter: Payer: Self-pay | Admitting: Internal Medicine

## 2012-06-15 VITALS — BP 105/73 | HR 88 | Temp 97.3°F | Wt 240.7 lb

## 2012-06-15 DIAGNOSIS — R05 Cough: Secondary | ICD-10-CM | POA: Insufficient documentation

## 2012-06-15 DIAGNOSIS — E785 Hyperlipidemia, unspecified: Secondary | ICD-10-CM

## 2012-06-15 DIAGNOSIS — E1142 Type 2 diabetes mellitus with diabetic polyneuropathy: Secondary | ICD-10-CM

## 2012-06-15 DIAGNOSIS — I10 Essential (primary) hypertension: Secondary | ICD-10-CM

## 2012-06-15 DIAGNOSIS — E1140 Type 2 diabetes mellitus with diabetic neuropathy, unspecified: Secondary | ICD-10-CM

## 2012-06-15 DIAGNOSIS — M549 Dorsalgia, unspecified: Secondary | ICD-10-CM

## 2012-06-15 DIAGNOSIS — R059 Cough, unspecified: Secondary | ICD-10-CM | POA: Insufficient documentation

## 2012-06-15 DIAGNOSIS — E119 Type 2 diabetes mellitus without complications: Secondary | ICD-10-CM | POA: Insufficient documentation

## 2012-06-15 DIAGNOSIS — Z79899 Other long term (current) drug therapy: Secondary | ICD-10-CM

## 2012-06-15 DIAGNOSIS — E1149 Type 2 diabetes mellitus with other diabetic neurological complication: Secondary | ICD-10-CM

## 2012-06-15 LAB — GLUCOSE, CAPILLARY: Glucose-Capillary: 157 mg/dL — ABNORMAL HIGH (ref 70–99)

## 2012-06-15 MED ORDER — BENZONATATE 100 MG PO CAPS: 100.0000 mg | ORAL_CAPSULE | Freq: Four times a day (QID) | ORAL | Status: DC | PRN

## 2012-06-15 MED ORDER — OXYCODONE-ACETAMINOPHEN 5-325 MG PO TABS: 1.0000 | ORAL_TABLET | Freq: Three times a day (TID) | ORAL | Status: DC | PRN

## 2012-06-15 MED ORDER — INSULIN ASPART 100 UNIT/ML ~~LOC~~ SOLN: SUBCUTANEOUS | Status: DC

## 2012-06-15 NOTE — Telephone Encounter (Signed)
Please change quantity to 30 ml which will equal a 3 month supply.   You had 3 ml which is only 1 pen so the year supply was used up.

## 2012-06-15 NOTE — Assessment & Plan Note (Signed)
Lab Results  Component Value Date   HGBA1C 8.1 06/15/2012   HGBA1C 8.3 03/02/2012   HGBA1C 9.3 12/02/2011     Assessment:  Diabetes control: fair control  Progress toward A1C goal:  improved  Comments: She has had several recent acute illnesses which may hinder her progress.  Plan:  Medications:  continue metformin and use novolog 10 units with breakfast and lunch 12 units with dinner. currently 80 units of lantus at night time.  Home glucose monitoring:   Frequency: 4 times a day   Timing: at bedtime;before meals  Instruction/counseling given: reminded to get eye exam, reminded to bring blood glucose meter & log to each visit, reminded to bring medications to each visit, discussed the need for weight loss and discussed sick day management  Educational resources provided: brochure  Self management tools provided: copy of home glucose meter download  Other plans:

## 2012-06-15 NOTE — Assessment & Plan Note (Signed)
BP Readings from Last 3 Encounters:  06/15/12 105/73  06/09/12 129/61  05/04/12 130/82    Lab Results  Component Value Date   NA 135 08/25/2011   K 3.6 08/25/2011   CREATININE 0.51 08/25/2011    Assessment:  Blood pressure control: controlled  Progress toward BP goal:  at goal  Comments: no change  Plan:  Medications:  continue current medications, hctz 25 mg and losartan 50 mg daily  Educational resources provided: brochure  Self management tools provided:    Other plans: check BMP today for Cr

## 2012-06-15 NOTE — Assessment & Plan Note (Signed)
Lipids were not at goal last panel 6 months ago and had inadvertantly been stopped (due to change in insurance) but is now taking pravastatin 40 mg daily and will recheck lipid panel in 6 months.

## 2012-06-15 NOTE — Progress Notes (Signed)
Subjective:     Patient ID: [name], female   DOB: 01/13/1961, 52 y.o.   MRN: [id number]  HPI The patient is a 52 YO female who comes in for a return visit and follow up of her diabetes, HTN, obesity, hyperlipidemia. She does have history of neuropathy with her diabetes and has been on topamax and gabapentin in the past. She notes that her sugars have been a little elevated since her recent URI infection. She had one in November which fully resolved however had another episode about 1 week ago which is still lingering. She states she is still coughing although her breathing is stable and she is not having much production to her cough. She still feels like she is having some drainage. Mild (~100F) fever at home over the last week. She finished z pack early this week. She has noted much improvement since then. She is still using her insulins at home and taking her medications as prescribed. She is still having appetite and eating and drinking well. She is not feeling lightheaded or dizzy. She is not having chest pain but feels her energy is slightly less with this recent illness. No nausea or vomiting at home.  Review of Systems  Constitutional: Positive for fever. Negative for chills, diaphoresis, activity change, appetite change, fatigue and unexpected weight change.  HENT: Positive for congestion, rhinorrhea and sinus pressure. Negative for hearing loss, ear pain, nosebleeds, sore throat, facial swelling, sneezing, drooling, trouble swallowing, neck pain, neck stiffness, dental problem, postnasal drip, tinnitus and ear discharge.   Eyes: Negative.   Respiratory: Positive for cough. Negative for choking, chest tightness, shortness of breath, wheezing and stridor.   Cardiovascular: Negative for chest pain, palpitations and leg swelling.  Gastrointestinal: Negative for nausea, vomiting, diarrhea, constipation and abdominal distention.  Musculoskeletal: Positive for myalgias, back pain and arthralgias.  Negative for joint swelling and gait problem.  Skin: Negative for color change, pallor, rash and wound.  Neurological: Positive for headaches. Negative for dizziness, tremors, seizures, syncope, facial asymmetry, speech difficulty, weakness, light-headedness and numbness.       After bouts of coughing, some headache.  Hematological: Negative for adenopathy. Does not bruise/bleed easily.       Objective:   Physical Exam  Constitutional: She is oriented to person, place, and time. She appears well-developed and well-nourished.  HENT:  Head: Normocephalic and atraumatic.       Minimal drainage and mild redness of the pharynx but no purulent drainage.   Eyes: EOM are normal. Pupils are equal, round, and reactive to light.  Neck: Normal range of motion. Neck supple. No JVD present. No thyromegaly present.  Cardiovascular: Normal rate.   No murmur heard. Pulmonary/Chest: Effort normal and breath sounds normal. No respiratory distress. She has no wheezes. She has no rales. She exhibits no tenderness.       Good air flow heard throughout and no wheezing or crackles auscultated.  Abdominal: Soft. Bowel sounds are normal. She exhibits no distension. There is no tenderness. There is no rebound.  Musculoskeletal: Normal range of motion. She exhibits no edema and no tenderness.  Neurological: She is alert and oriented to person, place, and time. No cranial nerve deficit.  Skin: Skin is warm and dry. No erythema.       Assessment/Plan:   1. Please see problem oriented charting.  2. Disposition - The patient will be seen back in 2-3 months. She will get cxr to rule out bacterial infection although likely to be  residual cough from URI at end of December. Will also rx tessalon pearls. Will increase novolog to 12 units with dinner and remain 10 units with lunch and breakfast. Have advised if sugars stable and still high in evenings to increase lantus to 85 units at night time. If not feeling better in  1-2 weeks she is to call back and may need to be seen sooner.

## 2012-06-15 NOTE — Patient Instructions (Signed)
General Instructions:  We will check a chest x-ray to look for any infection in your lungs.  We would like you to take lantus 85 units at night time. Take novolog 10 units with breakfast and lunch and 12 units with dinner. Continue to check your sugars.   Come back in 2-3 months for a check up of your diabetes, or if you are not feeling better please call us back at [phone].  Treatment Goals:  Goals (1 Years of Data) as of 06/15/2012          As of Today 06/09/12 05/04/12 05/02/12 03/21/12     Blood Pressure    . Blood Pressure < 140/90  105/73 129/61 130/82 142/81 104/69     Result Component    . HEMOGLOBIN A1C < 7.0  8.1        . LDL CALC < 100            Progress Toward Treatment Goals:  Treatment Goal 06/15/2012  Hemoglobin A1C improved  Blood pressure at goal    Self Care Goals & Plans:    Home Blood Glucose Monitoring 06/15/2012  Check my blood sugar 4 times a day  When to check my blood sugar at bedtime; before meals     Care Management & Community Referrals:  Referral 06/15/2012  Referrals made for care management support none needed

## 2012-06-15 NOTE — Assessment & Plan Note (Signed)
She continues to follow at [facility] and she states that she is to call and schedule another apt with them regarding possible back injection.

## 2012-06-15 NOTE — Telephone Encounter (Signed)
Rx faxed in.

## 2012-06-16 LAB — BASIC METABOLIC PANEL WITH GFR
GFR, Est African American: >89 mL/min
GFR, Est Non African American: >89 mL/min
Glucose, Bld: 154 mg/dL — ABNORMAL HIGH (ref 70–99)
Potassium: 3.2 mEq/L — ABNORMAL LOW (ref 3.5–5.3)
Sodium: 140 mEq/L (ref 135–145)

## 2012-06-19 ENCOUNTER — Other Ambulatory Visit: Payer: Self-pay | Admitting: *Deleted

## 2012-06-19 MED ORDER — ROSUVASTATIN CALCIUM 10 MG PO TABS: 10.0000 mg | ORAL_TABLET | Freq: Every day | ORAL | Status: DC

## 2012-06-19 NOTE — Telephone Encounter (Signed)
Pt would like to get this med at [facility].  They do not carry pravastatin but can supply Crestor.  Will you change for pt?

## 2012-06-19 NOTE — Telephone Encounter (Signed)
I have changed you can call this to the [facility].  Thanks,  Dr. Dorise Hiss

## 2012-06-22 NOTE — Telephone Encounter (Signed)
Rx change called into [facility]

## 2012-06-29 ENCOUNTER — Other Ambulatory Visit: Payer: Self-pay | Admitting: *Deleted

## 2012-06-29 MED ORDER — LOSARTAN POTASSIUM 50 MG PO TABS: 50.0000 mg | ORAL_TABLET | Freq: Every day | ORAL | Status: DC

## 2012-07-20 ENCOUNTER — Other Ambulatory Visit: Payer: Self-pay | Admitting: *Deleted

## 2012-07-20 DIAGNOSIS — E1140 Type 2 diabetes mellitus with diabetic neuropathy, unspecified: Secondary | ICD-10-CM

## 2012-07-23 MED ORDER — GABAPENTIN 800 MG PO TABS: 800.0000 mg | ORAL_TABLET | Freq: Three times a day (TID) | ORAL | Status: DC

## 2012-07-23 NOTE — Telephone Encounter (Signed)
Called to pharm 

## 2012-08-01 ENCOUNTER — Other Ambulatory Visit: Payer: Self-pay | Admitting: *Deleted

## 2012-08-01 DIAGNOSIS — E1140 Type 2 diabetes mellitus with diabetic neuropathy, unspecified: Secondary | ICD-10-CM

## 2012-08-01 MED ORDER — INSULIN GLARGINE 100 UNIT/ML ~~LOC~~ SOLN: 80.0000 [IU] | Freq: Every day | SUBCUTANEOUS | Status: DC

## 2012-08-01 NOTE — Telephone Encounter (Signed)
Rx called in to pharmacy. 

## 2012-08-10 ENCOUNTER — Telehealth: Payer: Self-pay | Admitting: *Deleted

## 2012-08-10 NOTE — Telephone Encounter (Signed)
Pharmacy calls and states they have a question about gabapentin, seems pt told them that she sees a dr Jonelle Sports at [facility] ph [phone] and she states he told her to stop the med altogether and then if pain started again to start the med back, the pharm called the dr's office and they stated they did not know that pt was on the medicine but did not directly say they told her to stop. My answer was dr Dorise Hiss and [facility] have ordered this med for some time and dr Dorise Hiss just approved refill so fill it but no one can make pt take the med. i will call dr Barry Dienes office Monday for additional information.

## 2012-08-11 NOTE — Telephone Encounter (Signed)
She can certainly use it prn however she is usually complaining of neuropathic back pain so have scheduled it so as not to let pain get out of control.

## 2012-08-14 ENCOUNTER — Other Ambulatory Visit: Payer: Self-pay | Admitting: *Deleted

## 2012-08-14 DIAGNOSIS — M549 Dorsalgia, unspecified: Secondary | ICD-10-CM

## 2012-08-14 MED ORDER — OXYCODONE-ACETAMINOPHEN 5-325 MG PO TABS: 1.0000 | ORAL_TABLET | Freq: Three times a day (TID) | ORAL | Status: DC | PRN

## 2012-08-18 ENCOUNTER — Encounter (HOSPITAL_COMMUNITY): Payer: Self-pay | Admitting: Emergency Medicine

## 2012-08-18 ENCOUNTER — Emergency Department (HOSPITAL_COMMUNITY)
Admission: EM | Admit: 2012-08-18 | Discharge: 2012-08-18 | Disposition: A | Discharge status: Home / Self Care | Payer: Self-pay | Source: Home / Self Care

## 2012-08-18 DIAGNOSIS — J029 Acute pharyngitis, unspecified: Secondary | ICD-10-CM

## 2012-08-18 LAB — POCT RAPID STREP A: Streptococcus, Group A Screen (Direct): NEGATIVE

## 2012-08-18 MED ORDER — GUAIFENESIN-DM 100-10 MG/5ML PO SYRP: 5.0000 mL | ORAL_SOLUTION | Freq: Three times a day (TID) | ORAL | Status: DC | PRN

## 2012-08-18 MED ORDER — CETIRIZINE HCL 10 MG PO CAPS: 1.0000 | ORAL_CAPSULE | Freq: Every evening | ORAL | Status: DC | PRN

## 2012-08-18 MED ORDER — AZITHROMYCIN 1 G PO PACK: 1.0000 | PACK | Freq: Once | ORAL | Status: DC

## 2012-08-18 MED ORDER — METHYLPREDNISOLONE 4 MG PO KIT: PACK | ORAL | Status: DC

## 2012-08-18 NOTE — ED Notes (Signed)
Sore throat, chest and body aches.  Onset 2 weeks ago.

## 2012-08-18 NOTE — ED Notes (Signed)
Patient Demographics  [name], is a 52 y.o. female  AVW:[id number]  WGN:[id number]  DOB - [birth year]  Chief Complaint  Patient presents with  . Sore Throat        Subjective:   [name] here with 10 day history of runny nose, sinus congestion sore throat, denies problems swallowing food or liquids, no fever chills, no shortness of breath or chest pain, she does have some cough with mild amounts of phlegm.  Objective:    Filed Vitals:   08/18/12 1416  BP: 138/80  Pulse: 89  Temp: 98.8 F (37.1 C)  TempSrc: Oral  Resp: 20  SpO2: 98%     Exam  Awake Alert, Oriented X 3, No new F.N deficits, Normal affect [place].AT,PERRAL, some fluid behind the right tympanic membrane, throat exam unremarkable. Mild erythema in the pharynx Supple Neck,No JVD, No cervical lymphadenopathy appriciated.  Symmetrical Chest wall movement, Good air movement bilaterally, CTAB,  RRR,No Gallops,Rubs or new Murmurs, No Parasternal Heave +ve B.Sounds, Abd Soft, Non tender, No organomegaly appriciated, No rebound - guarding or rigidity. No palpable splenomegaly. No Cyanosis, Clubbing or edema, No new Rash or bruise    Data Review   CBC No results found for this basename: WBC, HGB, HCT, PLT, MCV, MCH, MCHC, RDW, NEUTRABS, LYMPHSABS, MONOABS, EOSABS, BASOSABS, BANDABS, BANDSABD,  in the last 168 hours  Chemistries   No results found for this basename: NA, K, CL, CO2, GLUCOSE, BUN, CREATININE, GFRCGP, CALCIUM, MG, AST, ALT, ALKPHOS, BILITOT,  in the last 168 hours ------------------------------------------------------------------------------------------------------------------ No results found for this basename: HGBA1C,  in the last 72 hours ------------------------------------------------------------------------------------------------------------------ No results found for this basename: CHOL, HDL, LDLCALC, TRIG, CHOLHDL, LDLDIRECT,  in the last 72  hours ------------------------------------------------------------------------------------------------------------------ No results found for this basename: TSH, T4TOTAL, FREET3, T3FREE, THYROIDAB,  in the last 72 hours ------------------------------------------------------------------------------------------------------------------ No results found for this basename: VITAMINB12, FOLATE, FERRITIN, TIBC, IRON, RETICCTPCT,  in the last 72 hours  Coagulation profile  No results found for this basename: INR, PROTIME,  in the last 168 hours     Prior to Admission medications   Medication Sig Start Date End Date Taking? Authorizing Provider  acyclovir (ZOVIRAX) 400 MG tablet Take 1 tablet (400 mg total) by mouth 3 (three) times daily. 04/05/12   Burns Spain, MD  aspirin 81 MG EC tablet Take 81 mg by mouth daily.      Historical Provider, MD  azithromycin (ZITHROMAX) 1 G powder Take 1 packet by mouth once. 08/18/12   Leroy Sea, MD  benzonatate (TESSALON PERLES) 100 MG capsule Take 1 capsule (100 mg total) by mouth every 6 (six) hours as needed for cough. 06/15/12 06/15/13  Judie Bonus, MD  Calcium Carbonate-Vitamin D (CALCIUM 600+D) 600-400 MG-UNIT per tablet Take 1 tablet by mouth daily.      Historical Provider, MD  celecoxib (CELEBREX) 200 MG capsule Take 1 capsule (200 mg total) by mouth daily. May take 1 capsule twice daily for acute pain. 12/02/11   Judie Bonus, MD  Cetirizine HCl (ZYRTEC ALLERGY) 10 MG CAPS Take 1 capsule (10 mg total) by mouth at bedtime and may repeat dose one time if needed. 08/18/12   Leroy Sea, MD  cyclobenzaprine (FLEXERIL) 10 MG tablet Take 1 tablet (10 mg total) by mouth 3 (three) times daily as needed for muscle spasms. 10/27/11 10/26/12  Judie Bonus, MD  esomeprazole (NEXIUM) 20 MG capsule Take 1 capsule (20 mg total) by mouth daily. 12/01/11 11/30/12  Judie Bonus, MD  gabapentin (NEURONTIN) 800 MG tablet Take 1 tablet (800 mg  total) by mouth 3 (three) times daily. 07/20/12   Judie Bonus, MD  glipiZIDE (GLUCOTROL) 10 MG tablet Take 1 tablet (10 mg total) by mouth daily. 05/14/12 08/07/19  Judie Bonus, MD  guaiFENesin-dextromethorphan (ROBITUSSIN DM) 100-10 MG/5ML syrup Take 5 mLs by mouth 3 (three) times daily as needed for cough. 08/18/12   Leroy Sea, MD  hydrochlorothiazide (HYDRODIURIL) 25 MG tablet Take 1 tablet (25 mg total) by mouth daily. 03/02/12   Judie Bonus, MD  insulin aspart (NOVOLOG FLEXPEN) 100 UNIT/ML injection Use to inject 10 units under skin before meals three times a day. Dx code 250.00 06/15/12   Judie Bonus, MD  insulin glargine (LANTUS) 100 UNIT/ML injection Inject 80 Units into the skin at bedtime. 08/01/12   Judie Bonus, MD  losartan (COZAAR) 50 MG tablet Take 1 tablet (50 mg total) by mouth daily. 06/29/12   Judie Bonus, MD  metFORMIN (GLUCOPHAGE) 1000 MG tablet Take 1 tablet (1,000 mg total) by mouth 2 (two) times daily with a meal. 12/02/11 12/01/12  Judie Bonus, MD  methylPREDNISolone (MEDROL, PAK,) 4 MG tablet follow package directions 08/18/12   Leroy Sea, MD  oxyCODONE-acetaminophen (PERCOCET/ROXICET) 5-325 MG per tablet Take 1 tablet by mouth every 8 (eight) hours as needed for pain. 08/14/12   Judie Bonus, MD  pravastatin (PRAVACHOL) 40 MG tablet Take 1 tablet (40 mg total) by mouth every evening. 05/04/12 05/04/13  Judie Bonus, MD  ranitidine (ZANTAC) 150 MG tablet Take 1 tablet (150 mg total) by mouth 2 (two) times daily. 02/15/12 02/14/13  Judie Bonus, MD  RELION PEN NEEDLE 31G/8MM 31G X 8 MM MISC USE AS DIRECTED 09/22/11   Judie Bonus, MD  rosuvastatin (CRESTOR) 10 MG tablet Take 1 tablet (10 mg total) by mouth at bedtime. 06/19/12 06/19/13  Judie Bonus, MD  topiramate (TOPAMAX) 50 MG tablet Take 50 mg by mouth 2 (two) times daily.     Historical Provider, MD     Assessment & Plan   Allergic rhinitis,  pharyngitis, blocked right eustachian tube - patient will be given short course of Zyrtec, Medrol Dosepak, Robitussin for cough relief, she is taking Afrin nasal sprays with good relief was recommended to continue taking that. Her rapid strep throat was negative.    Follow-up Information   Follow up with Primary care physician. Schedule an appointment as soon as possible for a visit in 1 week.       Leroy Sea M.D on 08/18/2012 at 3:29 PM  Leroy Sea, MD 08/18/12 [phone]

## 2012-08-24 ENCOUNTER — Other Ambulatory Visit: Payer: Self-pay | Admitting: Internal Medicine

## 2012-08-29 ENCOUNTER — Other Ambulatory Visit: Payer: Self-pay | Admitting: Internal Medicine

## 2012-08-29 DIAGNOSIS — Z1231 Encounter for screening mammogram for malignant neoplasm of breast: Secondary | ICD-10-CM

## 2012-09-04 ENCOUNTER — Ambulatory Visit (INDEPENDENT_AMBULATORY_CARE_PROVIDER_SITE_OTHER): Payer: Self-pay | Admitting: Internal Medicine

## 2012-09-04 ENCOUNTER — Encounter: Payer: Self-pay | Admitting: Internal Medicine

## 2012-09-04 VITALS — BP 106/68 | HR 90 | Temp 97.1°F | Ht 63.5 in | Wt 241.9 lb

## 2012-09-04 DIAGNOSIS — J309 Allergic rhinitis, unspecified: Secondary | ICD-10-CM

## 2012-09-04 DIAGNOSIS — E1149 Type 2 diabetes mellitus with other diabetic neurological complication: Secondary | ICD-10-CM

## 2012-09-04 DIAGNOSIS — E1142 Type 2 diabetes mellitus with diabetic polyneuropathy: Secondary | ICD-10-CM

## 2012-09-04 DIAGNOSIS — I10 Essential (primary) hypertension: Secondary | ICD-10-CM

## 2012-09-04 DIAGNOSIS — J302 Other seasonal allergic rhinitis: Secondary | ICD-10-CM

## 2012-09-04 DIAGNOSIS — E1140 Type 2 diabetes mellitus with diabetic neuropathy, unspecified: Secondary | ICD-10-CM

## 2012-09-04 MED ORDER — INSULIN ASPART 100 UNIT/ML ~~LOC~~ SOLN: SUBCUTANEOUS | Status: DC

## 2012-09-04 MED ORDER — MOMETASONE FUROATE 50 MCG/ACT NA SUSP: 2.0000 | Freq: Every day | NASAL | Status: DC

## 2012-09-04 MED ORDER — FLUTICASONE PROPIONATE 50 MCG/ACT NA SUSP: 1.0000 | Freq: Every day | NASAL | Status: DC

## 2012-09-04 NOTE — Assessment & Plan Note (Signed)
Encouraged again to work on diet and increasing exercise. Now that her sisters are also trying to lose weight and get healthy she may be more successful.

## 2012-09-04 NOTE — Assessment & Plan Note (Signed)
BP Readings from Last 3 Encounters:  09/04/12 106/68  08/18/12 138/80  06/15/12 105/73    Lab Results  Component Value Date   NA 140 06/15/2012   K 3.2* 06/15/2012   CREATININE 0.67 06/15/2012    Assessment:  Blood pressure control: controlled  Progress toward BP goal:  at goal  Comments:   Plan:  Medications:  continue current medications, HCTZ 25 mg, losartan 50 mg daily  Educational resources provided: video  Self management tools provided: instructions for home blood pressure monitoring  Other plans:

## 2012-09-04 NOTE — Addendum Note (Signed)
Addended by: Genella Mech A on: 09/04/2012 11:48 AM   Modules accepted: Orders, Medications

## 2012-09-04 NOTE — Progress Notes (Signed)
Subjective:     Patient ID: [name], female   DOB: [birth year], 52 y.o.   MRN: [id number]  HPI The patient is a 52 YO woman with PMH of HTN, DM II, hyperlipidemia, chronic back pain who presents for routine follow up visit. She states that she went to [facility] for her eyes recently and is in the process of getting eyeglasses. She also had her eyes checked for diabetes. She is working on increasing her exercise and working on diet with her 2 sisters. She states that she is doing a 3.5 mile walk this weekend. She has noticed that her weight is stable although she did not exercise much over the winter. She is not having problems with chest pain or SOB although she did have some muscle soreness after recent exercise going up a hill. She is continuing to take her medications as prescribed. She had one episode of hypoglycemia to 50s for which she had a snack and it resolved. She is still having some scratching in her throat although overall it is much improved since last visit. She is having some numbness in her right arm and wrist with activity and has history of carpal tunnel there. She did see someone for it and stopped going when they suggested she needed surgery on it. She has not fallen at home and is overall doing well with the pain in her back.   Review of Systems  Constitutional: Negative for fever, chills, diaphoresis, activity change, appetite change, fatigue and unexpected weight change.  HENT: Positive for congestion. Negative for hearing loss, ear pain, nosebleeds, sore throat, facial swelling, rhinorrhea, sneezing, drooling, trouble swallowing, neck pain, neck stiffness, dental problem, postnasal drip, sinus pressure, tinnitus and ear discharge.   Eyes: Negative.   Respiratory: Negative for cough, choking, chest tightness, shortness of breath, wheezing and stridor.   Cardiovascular: Negative for chest pain, palpitations and leg swelling.  Gastrointestinal: Negative for nausea,  vomiting, diarrhea, constipation and abdominal distention.  Musculoskeletal: Positive for myalgias, back pain and arthralgias. Negative for joint swelling and gait problem.  Skin: Negative for color change, pallor, rash and wound.  Neurological: Negative for dizziness, tremors, seizures, syncope, facial asymmetry, speech difficulty, weakness, light-headedness, numbness and headaches.       After bouts of coughing, some headache.  Hematological: Negative for adenopathy. Does not bruise/bleed easily.       Objective:   Physical Exam  Constitutional: She is oriented to person, place, and time. She appears well-developed and well-nourished.  HENT:  Head: Normocephalic and atraumatic.  Eyes: EOM are normal. Pupils are equal, round, and reactive to light.  Neck: Normal range of motion. Neck supple. No JVD present. No thyromegaly present.  Cardiovascular: Normal rate.   No murmur heard. Pulmonary/Chest: Effort normal and breath sounds normal. No respiratory distress. She has no wheezes. She has no rales. She exhibits no tenderness.  Good air flow heard throughout and no wheezing or crackles auscultated.  Abdominal: Soft. Bowel sounds are normal. She exhibits no distension. There is no tenderness. There is no rebound.  Musculoskeletal: Normal range of motion. She exhibits no edema and no tenderness.  Neurological: She is alert and oriented to person, place, and time. No cranial nerve deficit.  Skin: Skin is warm and dry. No erythema.       Assessment/Plan:   1. Please see problem oriented charting.  2. Disposition - STOP glipizide. Increase novolog to 11 units with breakfast and lunch, 13 units with dinner. Keep lantus  the same at 80 units nightly. Gave advice regarding increasing exercise and working on diet. Added flonase for better allergy control.

## 2012-09-04 NOTE — Patient Instructions (Signed)
STOP Glipizide!  Increase your novolog (mealtime insulin) to 11 units with breakfast, 11 units with lunch, 13 units with dinner.  Keep your lantus the same (80 units at night time).   Come back in 3 months to check on your sugars.  Keep working to increase your exercise and lose weight.  Exercise to Lose Weight Exercise and a healthy diet may help you lose weight. Your doctor may suggest specific exercises. EXERCISE IDEAS AND TIPS  Choose low-cost things you enjoy doing, such as walking, bicycling, or exercising to workout videos.  Take stairs instead of the elevator.  Walk during your lunch break.  Park your car further away from work or school.  Go to a gym or an exercise class.  Start with 5 to 10 minutes of exercise each day. Build up to 30 minutes of exercise 4 to 6 days a week.  Wear shoes with good support and comfortable clothes.  Stretch before and after working out.  Work out until you breathe harder and your heart beats faster.  Drink extra water when you exercise.  Do not do so much that you hurt yourself, feel dizzy, or get very short of breath. Exercises that burn about 150 calories:  Running 1  miles in 15 minutes.  Playing volleyball for 45 to 60 minutes.  Washing and waxing a car for 45 to 60 minutes.  Playing touch football for 45 minutes.  Walking 1  miles in 35 minutes.  Pushing a stroller 1  miles in 30 minutes.  Playing basketball for 30 minutes.  Raking leaves for 30 minutes.  Bicycling 5 miles in 30 minutes.  Walking 2 miles in 30 minutes.  Dancing for 30 minutes.  Shoveling snow for 15 minutes.  Swimming laps for 20 minutes.  Walking up stairs for 15 minutes.  Bicycling 4 miles in 15 minutes.  Gardening for 30 to 45 minutes.  Jumping rope for 15 minutes.  Washing windows or floors for 45 to 60 minutes. Document Released: 07/02/2010 Document Revised: 08/22/2011 Document Reviewed: 07/02/2010 [facility] Patient  Information 2013 [place], [place].

## 2012-09-04 NOTE — Assessment & Plan Note (Signed)
Lab Results  Component Value Date   HGBA1C 7.9 09/04/2012   HGBA1C 8.1 06/15/2012   HGBA1C 8.3 03/02/2012     Assessment:  Diabetes control: fair control  Progress toward A1C goal:  improved  Comments:   Plan:  Medications:  STOP glipizide, increase novolog to 11 units with breakfast and lunch, 13 units with dinner. Lantus still 80 units at bedtime, metformin 1000 mg BID.  Home glucose monitoring:   Frequency: 4 times a day   Timing: after breakfast;after lunch;after dinner;before breakfast  Instruction/counseling given: reminded to bring blood glucose meter & log to each visit, discussed the need for weight loss and discussed diet  Educational resources provided: brochure  Self management tools provided: copy of home glucose meter download  Other plans: Patient did get eye exam at [facility] and will try to get records for computer.

## 2012-10-01 ENCOUNTER — Ambulatory Visit (HOSPITAL_COMMUNITY)
Admission: RE | Admit: 2012-10-01 | Discharge: 2012-10-01 | Disposition: A | Discharge status: Home / Self Care | Payer: Self-pay | Source: Ambulatory Visit | Attending: Internal Medicine | Admitting: Internal Medicine

## 2012-10-01 DIAGNOSIS — Z1231 Encounter for screening mammogram for malignant neoplasm of breast: Secondary | ICD-10-CM

## 2012-10-02 ENCOUNTER — Telehealth: Payer: Self-pay | Admitting: *Deleted

## 2012-10-02 NOTE — Telephone Encounter (Signed)
With those symptoms [name] needs immediate evaluation.  I agree with the advise to proceed immediately to the ED.  Thank You.

## 2012-10-02 NOTE — Telephone Encounter (Signed)
Pt calls and states she is having chest pain, shortness of breath and it is hard to swallow, she is advised to go now to ED, she is agreeable and will leave now, someone will drive her

## 2012-10-03 ENCOUNTER — Emergency Department (HOSPITAL_COMMUNITY)
Admission: EM | Admit: 2012-10-03 | Discharge: 2012-10-03 | Disposition: A | Discharge status: Home / Self Care | Payer: Self-pay | Source: Home / Self Care | Attending: Emergency Medicine | Admitting: Emergency Medicine

## 2012-10-03 ENCOUNTER — Other Ambulatory Visit: Payer: Self-pay | Admitting: *Deleted

## 2012-10-03 ENCOUNTER — Encounter (HOSPITAL_COMMUNITY): Payer: Self-pay

## 2012-10-03 ENCOUNTER — Other Ambulatory Visit: Payer: Self-pay | Admitting: Internal Medicine

## 2012-10-03 DIAGNOSIS — E1140 Type 2 diabetes mellitus with diabetic neuropathy, unspecified: Secondary | ICD-10-CM

## 2012-10-03 DIAGNOSIS — J329 Chronic sinusitis, unspecified: Secondary | ICD-10-CM

## 2012-10-03 DIAGNOSIS — R05 Cough: Secondary | ICD-10-CM

## 2012-10-03 DIAGNOSIS — R0982 Postnasal drip: Secondary | ICD-10-CM

## 2012-10-03 MED ORDER — CETIRIZINE-PSEUDOEPHEDRINE ER 5-120 MG PO TB12: 1.0000 | ORAL_TABLET | Freq: Every day | ORAL | Status: DC

## 2012-10-03 MED ORDER — METFORMIN HCL 1000 MG PO TABS: 1000.0000 mg | ORAL_TABLET | Freq: Two times a day (BID) | ORAL | Status: DC

## 2012-10-03 MED ORDER — ALBUTEROL SULFATE HFA 108 (90 BASE) MCG/ACT IN AERS
INHALATION_SPRAY | RESPIRATORY_TRACT | Status: AC
  Filled 2012-10-03: qty 6.7

## 2012-10-03 MED ORDER — ALBUTEROL SULFATE HFA 108 (90 BASE) MCG/ACT IN AERS
1.0000 | INHALATION_SPRAY | RESPIRATORY_TRACT | Status: DC | PRN
  Administered 2012-10-03: 2 via RESPIRATORY_TRACT

## 2012-10-03 NOTE — ED Notes (Signed)
Patient called to say that MD PCP did not want her on Zyrtec D d/t BP elevation. Spoke with Dr. Rhys Martini. Patient told to buy Zyrtec plain OTC . Patient also asked for refill of Nasonex Spray - called in w/Ok by Dr. Artis Flock to [facility] on [address]. 1 spray each nostril BID.Marland Kitchen

## 2012-10-03 NOTE — Telephone Encounter (Signed)
Pt informed of refill refusal for Ip. Bromide and not to take Cetirizine D per Dr Dorise Hiss. And to call [facility] for refill. Pt wants to know what she can take instead of Cetirizine D?

## 2012-10-03 NOTE — ED Notes (Signed)
C/o cough off and on since December, now developing SOB; white/yellow secretions; NAD

## 2012-10-03 NOTE — Telephone Encounter (Addendum)
Pt also requesting refill on Ipratropium Bromide 0.06% - take 2 sprays QID. She went to Urgent care today and was instructed to continue using this med but does not have refills. I would also prefer she not use the cetirizine D as this will increase her blood pressures.   Thanks, Dr. Dorise Hiss

## 2012-10-03 NOTE — ED Provider Notes (Signed)
History     CSN: [id number]  Arrival date & time 10/03/12  1001   First MD Initiated Contact with Patient 10/03/12 1024      Chief Complaint  Patient presents with  . Cough    (Consider location/radiation/quality/duration/timing/severity/associated sxs/prior treatment) HPI Comments: Patient presents this morning to urgent care complaining that she's been having an intermittent cough since December. She has seen multiple providers she explains for her current symptoms which also includes a stuffy runny nose, drainage in the back of her throat, and a tickling sensation in the back of her throat that is constant. She describes she can barely sleep at night and she's awaken by her cough. Her cough is usually dry but occasionally she does see yellowish phlegm. She has been taking multiple over-the-counter decongestants and medicines as well as a nasal spray prescribed to her by a urgent care provider which she uses once a day.  She has been taking some decongestants over-the-counter with partial relief. She's also describing that she uses a CPAP at night for about 2 years ( at this point she is unaware of who prescribe her orders monitoring her CPAP usage), however she does describe that she is a patient of the internal medicine clinic at [facility].   She is planning a trip to the mountains and wants to feel better before she does it was perhaps considering taking a fourth course of antibiotics, as she has taken 3 courses of a Z-Pak, ( she can't recall exactly where)   Patient is a 52 y.o. female presenting with cough. The history is provided by the patient.  Cough Cough characteristics:  Productive and paroxysmal Sputum characteristics:  Clear, white and yellow Severity:  Mild Onset quality:  Gradual Timing:  Intermittent Context: exposure to allergens, upper respiratory infection and with activity   Context: not sick contacts and not smoke exposure   Relieved by:  Cough suppressants and  beta-agonist inhaler Ineffective treatments:  Decongestant and cough suppressants (3 courses of antibiotics as per patient since December) Associated symptoms: rhinorrhea, shortness of breath and sore throat   Associated symptoms: no chest pain, no diaphoresis, no fever, no headaches, no myalgias and no wheezing   Risk factors: no chemical exposure and no recent travel     Past Medical History  Diagnosis Date  . Chronic back pain   . Hyperlipidemia   . Hypertension   . Uterine fibroid     s/p hysterectomy  . Cocaine abuse     in remission  . Tobacco abuse   . Chronic leg pain     due to back pain  . Diabetes     with neuropathy  . RECTAL BLEEDING 12/09/2008    Annotation: s/p EGD 7/08 mild gastritis, s/p colonoscopy 7/08- benign polyp  s/p polypectomy and isolated diverticulum.  Qualifier: Diagnosis of  By: Ditzler RN, Debra      Past Surgical History  Procedure Laterality Date  . Total abdominal hysterectomy  05/24/2007    hysterectomy  . Breast reduction surgery  1982  . Tubal ligation    . Tonsillectomy  2008  . Back surgery  2012    L5-S1 microendoscopic disectomy last surgery 06/2011    Family History  Problem Relation Age of Onset  . Diabetes Father   . Heart disease Father   . Hypertension Father   . Diabetes Mother   . Cancer Mother     brain  . Hypertension Mother     History  Substance Use  Topics  . Smoking status: Former Smoker    Quit date: 07/23/1998  . Smokeless tobacco: Never Used  . Alcohol Use: No     Comment: recovering addict clean for 5 years    OB History   Grav Para Term Preterm Abortions TAB SAB Ect Mult Living   5    2 2    3       Review of Systems  Constitutional: Negative for fever, diaphoresis, activity change and appetite change.  HENT: Positive for congestion, sore throat, rhinorrhea, sneezing and postnasal drip. Negative for facial swelling, trouble swallowing, neck pain, neck stiffness, voice change and ear discharge.     Respiratory: Positive for cough and shortness of breath. Negative for choking, chest tightness, wheezing and stridor.   Cardiovascular: Negative for chest pain, palpitations and leg swelling.  Gastrointestinal: Negative for vomiting.  Musculoskeletal: Negative for myalgias, back pain, joint swelling and arthralgias.  Neurological: Negative for headaches.    Allergies  Review of patient's allergies indicates no known allergies.  Home Medications   Current Outpatient Rx  Name  Route  Sig  Dispense  Refill  . acyclovir (ZOVIRAX) 400 MG tablet   Oral   Take 1 tablet (400 mg total) by mouth 3 (three) times daily.   28 tablet   0   . aspirin 81 MG EC tablet   Oral   Take 81 mg by mouth daily.           . Calcium Carbonate-Vitamin D (CALCIUM 600+D) 600-400 MG-UNIT per tablet   Oral   Take 1 tablet by mouth daily.           . celecoxib (CELEBREX) 200 MG capsule   Oral   Take 1 capsule (200 mg total) by mouth daily. May take 1 capsule twice daily for acute pain.   30 capsule   5   . cetirizine-pseudoephedrine (ZYRTEC-D) 5-120 MG per tablet   Oral   Take 1 tablet by mouth daily.   30 tablet   0   . cyclobenzaprine (FLEXERIL) 10 MG tablet   Oral   Take 1 tablet (10 mg total) by mouth 3 (three) times daily as needed for muscle spasms.   30 tablet   1   . esomeprazole (NEXIUM) 20 MG capsule   Oral   Take 1 capsule (20 mg total) by mouth daily.   90 capsule   3   . gabapentin (NEURONTIN) 800 MG tablet   Oral   Take 1 tablet (800 mg total) by mouth 3 (three) times daily.   90 tablet   1   . hydrochlorothiazide (HYDRODIURIL) 25 MG tablet   Oral   Take 1 tablet (25 mg total) by mouth daily.   30 tablet   5   . insulin aspart (NOVOLOG) 100 UNIT/ML injection      Use to inject 11 units under skin before breakfast and lunch. Inject 13 units before dinner. Dx code 250.00   1 vial      . insulin glargine (LANTUS) 100 UNIT/ML injection   Subcutaneous   Inject 80  Units into the skin at bedtime.   10 mL   12   . losartan (COZAAR) 50 MG tablet   Oral   Take 1 tablet (50 mg total) by mouth daily.   30 tablet   6   . metFORMIN (GLUCOPHAGE) 1000 MG tablet   Oral   Take 1 tablet (1,000 mg total) by mouth 2 (two) times daily with a  meal.   60 tablet   6   . mometasone (NASONEX) 50 MCG/ACT nasal spray   Nasal   Place 2 sprays into the nose daily.   17 g   2   . oxyCODONE-acetaminophen (PERCOCET/ROXICET) 5-325 MG per tablet   Oral   Take 1 tablet by mouth every 8 (eight) hours as needed for pain.   90 tablet   0   . pravastatin (PRAVACHOL) 40 MG tablet   Oral   Take 1 tablet (40 mg total) by mouth every evening.   30 tablet   11   . ranitidine (ZANTAC) 150 MG tablet   Oral   Take 1 tablet (150 mg total) by mouth 2 (two) times daily.   60 tablet   6   . RELION PEN NEEDLE 31G/8MM 31G X 8 MM MISC      USE AS DIRECTED   100 each   10   . rosuvastatin (CRESTOR) 10 MG tablet   Oral   Take 1 tablet (10 mg total) by mouth at bedtime.   30 tablet   11   . topiramate (TOPAMAX) 50 MG tablet   Oral   Take 50 mg by mouth 2 (two) times daily.            BP 146/78  Pulse 87  Temp(Src) 98.1 F (36.7 C) (Oral)  Resp 20  SpO2 97%  Physical Exam  Nursing note and vitals reviewed. Constitutional: Vital signs are normal.  Non-toxic appearance. She does not have a sickly appearance. She does not appear ill. No distress.  HENT:  Head: Normocephalic.  Right Ear: Tympanic membrane normal.  Left Ear: Tympanic membrane normal.  Nose: No mucosal edema or rhinorrhea. No epistaxis.  Mouth/Throat: Uvula is midline, oropharynx is clear and moist and mucous membranes are normal. No edematous. No oropharyngeal exudate, posterior oropharyngeal erythema or tonsillar abscesses.    Eyes: Conjunctivae are normal. Right eye exhibits no discharge. Left eye exhibits no discharge. No scleral icterus.  Neck: Neck supple.  Cardiovascular: Normal  rate.  Exam reveals no gallop and no friction rub.   No murmur heard. Pulmonary/Chest: Effort normal and breath sounds normal. No respiratory distress. She has no decreased breath sounds. She has no wheezes. She has no rhonchi. She has no rales. She exhibits no tenderness.  Lymphadenopathy:    She has no cervical adenopathy.       Right: No supraclavicular and no epitrochlear adenopathy present.       Left: No supraclavicular and no epitrochlear adenopathy present.  Skin: Skin is warm. She is not diaphoretic. No erythema.    ED Course  Procedures (including critical care time)  Labs Reviewed - No data to display No results found.   1. Cough   2. Postnasal discharge       MDM  Upper respiratory symptoms- recurrent/patient in no respiratory distress, afebrile looks comfortable during interview and exam patient with no cough. We reviewed some of the over-the-counter medicines that she is taking- we discussed the imperative need for her to followup with her primary care Dr. if her symptoms continue despite the use of Atrovent nasal spray which she was reminded of the proper usage and dose.  The only medicines patient was instructed to take the Zyrtec-D as well as to continue with the previously prescribed Atrovent nasal spray. She was also giving an albuterol inhaler at [facility]. Patient agrees with treatment plan and was appreciative of her care today.  Jimmie Molly, MD 10/03/12 1146

## 2012-10-03 NOTE — Telephone Encounter (Signed)
I will refill metformin but will not refill the ipratroprium bromide spray as we did not prescribe this for her in the past and she should have talked to urgent care about this when she was there.

## 2012-10-03 NOTE — Telephone Encounter (Signed)
She can take zyrtec without the D part which is pseudoephedrine.  Dr. Dorise Hiss

## 2012-10-04 NOTE — Telephone Encounter (Signed)
Metformin rx called to [facility] pharmacy. Also I called and left message that she can use plain Zyrtec, without the D, per Dr. Dorise Hiss.

## 2012-10-12 ENCOUNTER — Other Ambulatory Visit: Payer: Self-pay | Admitting: *Deleted

## 2012-10-15 NOTE — Telephone Encounter (Signed)
Called to pharm 

## 2012-10-25 ENCOUNTER — Other Ambulatory Visit: Payer: Self-pay | Admitting: *Deleted

## 2012-10-25 DIAGNOSIS — M549 Dorsalgia, unspecified: Secondary | ICD-10-CM

## 2012-10-25 NOTE — Telephone Encounter (Signed)
Last time filled 3/11 per [facility]. Call pt when ready @ [phone]

## 2012-10-26 MED ORDER — OXYCODONE-ACETAMINOPHEN 5-325 MG PO TABS: 1.0000 | ORAL_TABLET | Freq: Three times a day (TID) | ORAL | Status: DC | PRN

## 2012-10-26 NOTE — Telephone Encounter (Signed)
Pt informed Rx is ready 

## 2012-11-06 ENCOUNTER — Other Ambulatory Visit: Payer: Self-pay | Admitting: *Deleted

## 2012-11-06 DIAGNOSIS — K219 Gastro-esophageal reflux disease without esophagitis: Secondary | ICD-10-CM

## 2012-11-07 MED ORDER — RANITIDINE HCL 150 MG PO TABS: 150.0000 mg | ORAL_TABLET | Freq: Two times a day (BID) | ORAL | Status: DC

## 2012-11-09 ENCOUNTER — Encounter: Payer: Self-pay | Admitting: Internal Medicine

## 2012-11-09 ENCOUNTER — Ambulatory Visit (INDEPENDENT_AMBULATORY_CARE_PROVIDER_SITE_OTHER): Payer: Self-pay | Admitting: Internal Medicine

## 2012-11-09 DIAGNOSIS — I10 Essential (primary) hypertension: Secondary | ICD-10-CM

## 2012-11-09 DIAGNOSIS — E1149 Type 2 diabetes mellitus with other diabetic neurological complication: Secondary | ICD-10-CM

## 2012-11-09 DIAGNOSIS — E1140 Type 2 diabetes mellitus with diabetic neuropathy, unspecified: Secondary | ICD-10-CM

## 2012-11-09 DIAGNOSIS — E1142 Type 2 diabetes mellitus with diabetic polyneuropathy: Secondary | ICD-10-CM

## 2012-11-09 MED ORDER — FLUCONAZOLE 150 MG PO TABS: 150.0000 mg | ORAL_TABLET | ORAL | Status: DC

## 2012-11-09 MED ORDER — ACYCLOVIR 400 MG PO TABS: 400.0000 mg | ORAL_TABLET | Freq: Three times a day (TID) | ORAL | Status: DC

## 2012-11-09 NOTE — Patient Instructions (Signed)
Come back in 1-2 months to check on your sugars. Keep trying to lose weight and the silver sneakers program would be great! Consider going to a [employer] with a pool to try water aerobics.  Stop using cough drops with sugar in them. Work on [employer].  We will give you some medicine for your yeast infection to clear it up, take 1 pill every 3 days for 3 doses (total of 9 days).  Call with problems or questions at [phone].

## 2012-11-11 NOTE — Progress Notes (Signed)
Subjective:     Patient ID: [name], female   DOB: March 10, 1961, 52 y.o.   MRN: [id number]  HPI The patient is a 52 YO woman with PMH of HTN, DM II, hyperlipidemia, chronic back pain who presents for routine follow up visit. She states that she [facility] recently and is in the process of getting eyeglasses. She also had her eyes checked for diabetes. She has noticed that her weight is stable although she has been working on walking more. Her diet has not gone well. She is not having problems with chest pain or SOB. She is continuing to take her medications as prescribed. She is still having some scratching in her throat although overall it is much improved since last visit. She went to the ED and was given tussionex which helped. She went to an ENT doctor and they looked down her throat and gave her an antibiotic. She took it and has a yeast infection. No fevers or chills at home. She has not fallen at home and is overall doing well with the pain in her back although she may need to find another provider. She did get biten by a bug over the weekend and wanted that looked at. She was scratching it a little as it itched.   Review of Systems  Constitutional: Negative for fever, chills, diaphoresis, activity change, appetite change, fatigue and unexpected weight change.  HENT: Negative for hearing loss, ear pain, nosebleeds, congestion, sore throat, facial swelling, rhinorrhea, sneezing, drooling, trouble swallowing, neck pain, neck stiffness, dental problem, postnasal drip, sinus pressure, tinnitus and ear discharge.   Eyes: Negative.   Respiratory: Negative for cough, choking, chest tightness, shortness of breath, wheezing and stridor.   Cardiovascular: Negative for chest pain, palpitations and leg swelling.  Gastrointestinal: Negative for nausea, vomiting, diarrhea, constipation and abdominal distention.  Musculoskeletal: Positive for myalgias, back pain and arthralgias.  Negative for joint swelling and gait problem.  Skin: Positive for rash. Negative for color change, pallor and wound.  Neurological: Negative for dizziness, tremors, seizures, syncope, facial asymmetry, speech difficulty, weakness, light-headedness, numbness and headaches.  Hematological: Negative for adenopathy. Does not bruise/bleed easily.       Objective:   Physical Exam  Constitutional: She is oriented to person, place, and time. She appears well-developed and well-nourished.  HENT:  Head: Normocephalic and atraumatic.  Eyes: EOM are normal. Pupils are equal, round, and reactive to light.  Neck: Normal range of motion. Neck supple. No JVD present. No thyromegaly present.  Cardiovascular: Normal rate.   No murmur heard. Pulmonary/Chest: Effort normal and breath sounds normal. No respiratory distress. She has no wheezes. She has no rales. She exhibits no tenderness.  Good air flow heard throughout and no wheezing or crackles auscultated.  Abdominal: Soft. Bowel sounds are normal. She exhibits no distension. There is no tenderness. There is no rebound.  Musculoskeletal: Normal range of motion. She exhibits no edema and no tenderness.  Neurological: She is alert and oriented to person, place, and time. No cranial nerve deficit.  Skin: Skin is warm and dry. No erythema.  Small area of bug bite on the lower aspect of the gluteal region on the left side. No breakdown of skin although healed region with stigmata of scratching. Appears to have been a mosquito bite.       Assessment/Plan:   1. Please see problem oriented charting.  2. Disposition - Novolog 11 units with breakfast and lunch, 13 units with  dinner. Keep lantus the same at 80 units nightly. Gave advice regarding increasing exercise and working on diet. Talked to her about weight loss surgery as she may benefit since she has been unable to lose weight on her own. She is going to start some water aerobics since she now has medicare  and silver sneakers program. She is getting fluconazole 150 mg q 72 hours for 3 doses for yeast infection given her diabetes. STOP cough drops.

## 2012-11-11 NOTE — Assessment & Plan Note (Signed)
Discussed weight loss surgery options with her as she is BMI over 40 and would qualify. She is not interested at this time. Se will start exercising with silver sneakers program.

## 2012-11-11 NOTE — Assessment & Plan Note (Signed)
Lab Results  Component Value Date   HGBA1C 7.9 09/04/2012   HGBA1C 8.1 06/15/2012   HGBA1C 8.3 03/02/2012     Assessment: Diabetes control: fair control Progress toward A1C goal:  unchanged Comments: working on weight loss would be very helpful  Plan: Medications:  continue current medications, lantus 80 units nightly, novolog 11 units with morning and supper and 13 units with lunch, metformin 1000 mg bid Home glucose monitoring: Frequency: 4 times a day Timing: before breakfast;at bedtime;before dinner Instruction/counseling given: reminded to get eye exam, reminded to bring blood glucose meter & log to each visit, discussed the need for weight loss and discussed diet Educational resources provided: web site link Self management tools provided: copy of home glucose meter download Other plans:

## 2012-11-11 NOTE — Assessment & Plan Note (Signed)
BP Readings from Last 3 Encounters:  11/09/12 117/78  10/03/12 146/78  09/04/12 106/68    Lab Results  Component Value Date   NA 140 06/15/2012   K 3.2* 06/15/2012   CREATININE 0.67 06/15/2012    Assessment: Blood pressure control: controlled Progress toward BP goal:  at goal Comments:   Plan: Medications:  continue current medications Educational resources provided: brochure Self management tools provided: home blood pressure logbook Other plans:

## 2012-11-14 NOTE — Progress Notes (Signed)
Case discussed with Dr. Kollar soon after she saw the patient.  We reviewed the resident's history and exam and pertinent patient test results. I agree with the assessment, diagnosis and plan of care documented in the resident's note. 

## 2012-11-30 ENCOUNTER — Other Ambulatory Visit: Payer: Self-pay | Admitting: *Deleted

## 2012-11-30 DIAGNOSIS — E1140 Type 2 diabetes mellitus with diabetic neuropathy, unspecified: Secondary | ICD-10-CM

## 2012-11-30 MED ORDER — INSULIN ASPART 100 UNIT/ML FLEXPEN: PEN_INJECTOR | SUBCUTANEOUS | Status: DC

## 2012-11-30 NOTE — Telephone Encounter (Signed)
Done.  Thanks,  Dr. Kollar 

## 2012-11-30 NOTE — Telephone Encounter (Signed)
Pt request flex pens, please change

## 2012-12-05 ENCOUNTER — Ambulatory Visit (INDEPENDENT_AMBULATORY_CARE_PROVIDER_SITE_OTHER): Payer: Self-pay | Admitting: *Deleted

## 2012-12-05 ENCOUNTER — Telehealth: Payer: Self-pay | Admitting: *Deleted

## 2012-12-05 DIAGNOSIS — Z111 Encounter for screening for respiratory tuberculosis: Secondary | ICD-10-CM

## 2012-12-05 NOTE — Telephone Encounter (Signed)
Pt wanted to check on last PPD 07/11/11 - left message on home phone ID recording. Stanton Kidney Rebecka Oelkers RN 12/04/12 4:30PM

## 2012-12-07 ENCOUNTER — Ambulatory Visit (INDEPENDENT_AMBULATORY_CARE_PROVIDER_SITE_OTHER): Payer: Medicare Other | Admitting: Internal Medicine

## 2012-12-07 ENCOUNTER — Encounter: Payer: Self-pay | Admitting: Internal Medicine

## 2012-12-07 VITALS — BP 123/86 | HR 71 | Temp 98.9°F | Ht 63.5 in | Wt 242.7 lb

## 2012-12-07 DIAGNOSIS — I10 Essential (primary) hypertension: Secondary | ICD-10-CM

## 2012-12-07 DIAGNOSIS — E1142 Type 2 diabetes mellitus with diabetic polyneuropathy: Secondary | ICD-10-CM

## 2012-12-07 DIAGNOSIS — E1149 Type 2 diabetes mellitus with other diabetic neurological complication: Secondary | ICD-10-CM

## 2012-12-07 DIAGNOSIS — R053 Chronic cough: Secondary | ICD-10-CM | POA: Insufficient documentation

## 2012-12-07 DIAGNOSIS — E1140 Type 2 diabetes mellitus with diabetic neuropathy, unspecified: Secondary | ICD-10-CM

## 2012-12-07 DIAGNOSIS — R05 Cough: Secondary | ICD-10-CM | POA: Insufficient documentation

## 2012-12-07 LAB — POCT GLYCOSYLATED HEMOGLOBIN (HGB A1C): Hemoglobin A1C: 8.2

## 2012-12-07 LAB — TB SKIN TEST
Induration: 0 mm
TB Skin Test: NEGATIVE

## 2012-12-07 LAB — GLUCOSE, CAPILLARY: Glucose-Capillary: 207 mg/dL — ABNORMAL HIGH (ref 70–99)

## 2012-12-07 NOTE — Assessment & Plan Note (Signed)
Lab Results  Component Value Date   HGBA1C 8.2 12/07/2012   HGBA1C 7.9 09/04/2012   HGBA1C 8.1 06/15/2012     Assessment: Diabetes control: fair control Progress toward A1C goal:  unchanged Comments: The patient's A1C has remained relatively unchanged, from 7.9 to 8.2.  She notes symptoms of hypoglycemia with low-normal blood sugars in the mornings.  Glucometer does not have sufficient data point later in the day to be able to generalize about blood sugar control as the day progresses.  As such, we will modestly increase her lunchtime insulin, and encourage the patient to record her blood sugars at various times throughout the day to better adjust her regimen.  Plan: Medications:  Continue lantus 80 daily.  Change novolog to 04-26-11 Home glucose monitoring: Frequency: 2 times a day Timing: before breakfast Instruction/counseling given: reminded to bring blood glucose meter & log to each visit Educational resources provided:   Self management tools provided:

## 2012-12-07 NOTE — Progress Notes (Signed)
Case discussed with Dr. Brown at the time of the visit.  We reviewed the resident's history and exam and pertinent patient test results.  I agree with the assessment, diagnosis, and plan of care documented in the resident's note. 

## 2012-12-07 NOTE — Assessment & Plan Note (Signed)
The patient notes a 49-month history of cough and throat discomfort.  Multiple therapies and ENT evaluation have not resolved symptoms.  Patient's description of symptoms suggests Globus, but the etiology is not entirely clear.  As routine treatments have failed, the patient would likely be best served by returning to ENT for follow-up evaluation. -recommend follow-up with ENT

## 2012-12-07 NOTE — Progress Notes (Signed)
HPI The patient is a 52 y.o. female with a history of DM, HTN, HL, presenting for a routine follow-up.  The patient notes a history of cough, present for the last 6 months.  She describes the cough as feeling a "scratch" in her throat.  Her cough is sometimes non-productive, but occasionally productive of clear/white mucous.  Her coughing has triggered vomiting on occasion.  She has tried Z-pack, cough drops, with only temporary relief.  She has been to ENT, who put the patient on nasocort, adjusted the patient's nexium, and gave her cough syrup.  The patient has a history of DM.  Her last A1C was 7.9, today it is 8.2.  The patient brings her glucometer today, which shows a range from 84-225, with an average of 154.  The patient checks her blood sugar at least every morning, and rarely again later in the day.  The patient is taking Novolog 04-24-11, and Lantus 80.  The patient notes 3 episodes of symptomatic hypoglycemia since her last visit, all of which occurred in the mornings, though blood sugars were in the 80's with all of these.  ROS: General: no fevers, chills, changes in weight, changes in appetite Skin: no rash HEENT: no blurry vision, hearing changes, sore throat Pulm: no dyspnea, wheezing CV: no chest pain, palpitations, shortness of breath Abd: no abdominal pain, nausea/vomiting, diarrhea/constipation GU: no dysuria, hematuria, polyuria Ext: no arthralgias, myalgias Neuro: no weakness, numbness, or tingling  Filed Vitals:   12/07/12 1328  BP: 123/86  Pulse: 71  Temp: 98.9 F (37.2 C)    PEX General: alert, cooperative, and in no apparent distress HEENT: pupils equal round and reactive to light, vision grossly intact, oropharynx clear and non-erythematous, tympanic membranes unremarkable Neck: supple, no lymphadenopathy Lungs: clear to ascultation bilaterally, normal work of respiration, no wheezes, rales, ronchi Heart: regular rate and rhythm, no murmurs, gallops, or  rubs Abdomen: soft, non-tender, non-distended, normal bowel sounds Extremities: no cyanosis, clubbing, or edema Neurologic: alert & oriented X3, cranial nerves II-XII intact, strength grossly intact, sensation intact to light touch  Current Outpatient Prescriptions on File Prior to Visit  Medication Sig Dispense Refill  . acyclovir (ZOVIRAX) 400 MG tablet Take 1 tablet (400 mg total) by mouth 3 (three) times daily.  28 tablet  0  . aspirin 81 MG EC tablet Take 81 mg by mouth daily.        . Calcium Carbonate-Vitamin D (CALCIUM 600+D) 600-400 MG-UNIT per tablet Take 1 tablet by mouth daily.        . celecoxib (CELEBREX) 200 MG capsule Take 1 capsule (200 mg total) by mouth daily. May take 1 capsule twice daily for acute pain.  30 capsule  5  . esomeprazole (NEXIUM) 20 MG capsule Take 1 capsule (20 mg total) by mouth daily.  90 capsule  3  . fluconazole (DIFLUCAN) 150 MG tablet Take 1 tablet (150 mg total) by mouth every 3 (three) days.  3 tablet  0  . gabapentin (NEURONTIN) 800 MG tablet Take 1 tablet (800 mg total) by mouth 3 (three) times daily.  90 tablet  1  . hydrochlorothiazide (HYDRODIURIL) 25 MG tablet Take 1 tablet (25 mg total) by mouth daily.  30 tablet  5  . insulin aspart (NOVOLOG FLEXPEN) 100 unit/mL SOLN FlexPen Inject 11 units under skin before breakfast and lunch. Inject 13 units before dinner.  15 mL  0  . insulin aspart (NOVOLOG) 100 UNIT/ML injection Use to inject 11 units under skin  before breakfast and lunch. Inject 13 units before dinner. Dx code 250.00  1 vial    . insulin glargine (LANTUS) 100 UNIT/ML injection Inject 80 Units into the skin at bedtime.  10 mL  12  . losartan (COZAAR) 50 MG tablet Take 1 tablet (50 mg total) by mouth daily.  30 tablet  6  . metFORMIN (GLUCOPHAGE) 1000 MG tablet Take 1 tablet (1,000 mg total) by mouth 2 (two) times daily with a meal.  60 tablet  3  . mometasone (NASONEX) 50 MCG/ACT nasal spray Place 2 sprays into the nose daily.  17 g  2  .  oxyCODONE-acetaminophen (PERCOCET/ROXICET) 5-325 MG per tablet Take 1 tablet by mouth every 8 (eight) hours as needed for pain.  90 tablet  0  . pravastatin (PRAVACHOL) 40 MG tablet Take 1 tablet (40 mg total) by mouth every evening.  30 tablet  11  . ranitidine (ZANTAC) 150 MG tablet Take 1 tablet (150 mg total) by mouth 2 (two) times daily.  60 tablet  1  . RELION PEN NEEDLE 31G/8MM 31G X 8 MM MISC USE AS DIRECTED  100 each  10  . topiramate (TOPAMAX) 50 MG tablet Take 50 mg by mouth 2 (two) times daily.       . [DISCONTINUED] simvastatin (ZOCOR) 40 MG tablet TAKE ONE TABLET BY MOUTH EVERY DAY  60 tablet  6   No current facility-administered medications on file prior to visit.    Assessment/Plan

## 2012-12-07 NOTE — Assessment & Plan Note (Signed)
BP Readings from Last 3 Encounters:  12/07/12 123/86  11/09/12 117/78  10/03/12 146/78    Lab Results  Component Value Date   NA 140 06/15/2012   K 3.2* 06/15/2012   CREATININE 0.67 06/15/2012    Assessment: Blood pressure control: controlled Progress toward BP goal:  at goal Comments: Well-controlled  Plan: Medications:  continue current medications Educational resources provided:   Self management tools provided:   Other plans: Recheck at next visit

## 2012-12-07 NOTE — Patient Instructions (Signed)
General Instructions: For your diabetes, your blood sugars are still mildly elevated.  To better help you adjust your insulin, we would like you to check your blood sugar at various times throughout the day, so that we can identify your highs and lows better. -in the meantime, increase your mealtime insulin to 11 units with breakfast, 13 units with lunch, and 12 units with dinner -continue your Lantus, 80 units once per day  For your cough, we recommend that you follow-up with Dr. Ezzard Standing with ENT for further treatment options.  Please return for a follow-up visit in 4 weeks.   Treatment Goals:  Goals (1 Years of Data) as of 12/07/12         As of Today 11/09/12 10/03/12 09/04/12 08/18/12     Blood Pressure    . Blood Pressure < 140/90  123/86 117/78 146/78 106/68 138/80     Result Component    . HEMOGLOBIN A1C < 7.0  8.2   7.9     . LDL CALC < 100            Progress Toward Treatment Goals:  Treatment Goal 12/07/2012  Hemoglobin A1C unchanged  Blood pressure at goal    Self Care Goals & Plans:  Self Care Goal 11/09/2012  Manage my medications take my medicines as prescribed  Monitor my health keep track of my blood glucose  Eat healthy foods eat fruit for snacks and desserts  Be physically active find an activity I enjoy    Home Blood Glucose Monitoring 12/07/2012  Check my blood sugar 2 times a day  When to check my blood sugar before breakfast     Care Management & Community Referrals:  Referral 12/07/2012  Referrals made for care management support none needed  Referrals made to community resources -

## 2012-12-20 ENCOUNTER — Other Ambulatory Visit: Payer: Self-pay

## 2012-12-31 ENCOUNTER — Telehealth: Payer: Self-pay | Admitting: *Deleted

## 2012-12-31 MED ORDER — FLUCONAZOLE 150 MG PO TABS: 150.0000 mg | ORAL_TABLET | Freq: Every day | ORAL | Status: DC

## 2012-12-31 NOTE — Telephone Encounter (Signed)
Pt left a message on triage ID recording - has vag yeast infection. Dr Ezzard Standing ENT doctor put pt back on antibiotics again recently. Pt uses [facility]. Stanton Kidney Jonell Brumbaugh RN 12/31/12 11:15AM

## 2012-12-31 NOTE — Telephone Encounter (Signed)
Rx sent and she should take fluconazole daily for 3 days.

## 2012-12-31 NOTE — Telephone Encounter (Signed)
Pt aware.

## 2013-01-11 ENCOUNTER — Ambulatory Visit (INDEPENDENT_AMBULATORY_CARE_PROVIDER_SITE_OTHER): Payer: Medicare Other | Admitting: Internal Medicine

## 2013-01-11 ENCOUNTER — Encounter: Payer: Self-pay | Admitting: Internal Medicine

## 2013-01-11 VITALS — BP 149/85 | HR 90 | Temp 97.7°F | Ht 64.0 in | Wt 250.8 lb

## 2013-01-11 DIAGNOSIS — E785 Hyperlipidemia, unspecified: Secondary | ICD-10-CM

## 2013-01-11 DIAGNOSIS — M549 Dorsalgia, unspecified: Secondary | ICD-10-CM

## 2013-01-11 DIAGNOSIS — E1140 Type 2 diabetes mellitus with diabetic neuropathy, unspecified: Secondary | ICD-10-CM

## 2013-01-11 DIAGNOSIS — R05 Cough: Secondary | ICD-10-CM

## 2013-01-11 DIAGNOSIS — E1142 Type 2 diabetes mellitus with diabetic polyneuropathy: Secondary | ICD-10-CM

## 2013-01-11 DIAGNOSIS — E1149 Type 2 diabetes mellitus with other diabetic neurological complication: Secondary | ICD-10-CM

## 2013-01-11 DIAGNOSIS — I10 Essential (primary) hypertension: Secondary | ICD-10-CM

## 2013-01-11 LAB — LIPID PANEL
Cholesterol: 174 mg/dL (ref 0–200)
Triglycerides: 219 mg/dL — ABNORMAL HIGH (ref <150)

## 2013-01-11 MED ORDER — INSULIN ISOPHANE & REGULAR (HUMAN 70-30)100 UNIT/ML KWIKPEN: 55.0000 [IU] | PEN_INJECTOR | Freq: Two times a day (BID) | SUBCUTANEOUS | Status: DC

## 2013-01-11 MED ORDER — OXYCODONE-ACETAMINOPHEN 5-325 MG PO TABS: 1.0000 | ORAL_TABLET | Freq: Three times a day (TID) | ORAL | Status: DC | PRN

## 2013-01-11 NOTE — Patient Instructions (Addendum)
General Instructions:  We will switch your insulin from lantus and novolog to only one kind of insulin medication.  STOP taking lantus and novolog!  Start taking humulin mix pen two times per day. Take with breakfast 55 units and dinner 55 units. Check your sugars in the morning and before bedtime every day while starting this medicine and also if you feel your sugar is getting low.  Come back in 2 months for a follow up and to adjust your insulin if needed.   We will have you see a lung doctor for your cough. If you have questions please call us at [phone].  Treatment Goals:  Goals (1 Years of Data) as of 01/11/13         As of Today 12/07/12 11/09/12 10/03/12 09/04/12     Blood Pressure    . Blood Pressure < 140/90  149/85 123/86 117/78 146/78 106/68     Result Component    . HEMOGLOBIN A1C < 7.0   8.2   7.9    . LDL CALC < 100            Progress Toward Treatment Goals:  Treatment Goal 01/11/2013  Hemoglobin A1C unchanged  Blood pressure unchanged    Self Care Goals & Plans:  Self Care Goal 01/11/2013  Manage my medications take my medicines as prescribed; bring my medications to every visit; refill my medications on time; follow the sick day instructions if I am sick  Monitor my health keep track of my blood glucose; bring my glucose meter and log to each visit; check my feet daily  Eat healthy foods eat more vegetables; eat baked foods instead of fried foods; eat fruit for snacks and desserts; eat foods that are low in salt; eat smaller portions; drink diet soda or water instead of juice or soda  Be physically active find an activity I enjoy; take a walk every day    Home Blood Glucose Monitoring 01/11/2013  Check my blood sugar 3 times a day  When to check my blood sugar before breakfast; before lunch; before dinner     Care Management & Community Referrals:  Referral 01/11/2013  Referrals made for care management support none needed  Referrals made to community  resources -

## 2013-01-12 NOTE — Assessment & Plan Note (Signed)
Lab Results  Component Value Date   HGBA1C 8.2 12/07/2012   HGBA1C 7.9 09/04/2012   HGBA1C 8.1 06/15/2012     Assessment: Diabetes control: fair control Progress toward A1C goal:  unchanged Comments: STOP lantus and novolog. Start humulin 70/30 flexpen 55 units BID.   Plan: Medications:  continue metformin 1000 BID, STOP novolog and lantus, START humulin 70/30 55 units BID Home glucose monitoring: Frequency: 3 times a day Timing: before breakfast;before lunch;before dinner Instruction/counseling given: reminded to bring blood glucose meter & log to each visit and discussed the need for weight loss Educational resources provided: brochure;handout Self management tools provided: copy of home glucose meter download Other plans: return in 2 months for close follow up or sooner if volatility with sugars

## 2013-01-12 NOTE — Assessment & Plan Note (Signed)
Advised her to start her exercise program with silver sneakers.

## 2013-01-12 NOTE — Assessment & Plan Note (Signed)
Seems to be partially related to GERD and she seems to be controlled with ranitidine 150 mg BID and nexium 40 mg daily. Have referred to ENT and they looked in throat and did not see any abnormalities. Will refer to pulmonology for possible cough variant asthma.

## 2013-01-12 NOTE — Progress Notes (Signed)
Subjective:     Patient ID: [name], female   DOB: 04-13-1961, 52 y.o.   MRN: [id number]  HPI  The patient is a 52 YO woman with PMH of HTN, DM II, hyperlipidemia, chronic back pain who presents for routine follow up visit. She has gotten her glasses and states that she does not have signs of diabetes in her eyes. She has been taking her novolog all in one dose in the evening 37 units and also takes the 80 units of lantus in the evening. She states that most of her morning sugars (the only time of day she checks her sugars) have been okay. She has not noticed any hypoglycemic or hyperglycemic episodes. She is currently out of her novolog and has been for about 1-2 days. She is unable to afford the pens of the novolog as her insurance does not cover it. She is not having problems with chest pain or SOB. She is continuing to take her medications as prescribed. She is still having some scratching in her throat with cough and she states that the ENT has advised her to go to a lung doctor. No fevers or chills at home. She is having some troubles with her diet and has not done well with weight loss although she has recently entered the silver sneakers program and will be starting water aerobics soon.  Review of Systems  Constitutional: Negative for fever, chills, diaphoresis, activity change, appetite change, fatigue and unexpected weight change.  HENT: Negative for hearing loss, ear pain, nosebleeds, congestion, sore throat, facial swelling, rhinorrhea, sneezing, drooling, trouble swallowing, neck pain, neck stiffness, dental problem, postnasal drip, sinus pressure, tinnitus and ear discharge.   Eyes: Negative.   Respiratory: Positive for cough. Negative for choking, chest tightness, shortness of breath, wheezing and stridor.   Cardiovascular: Negative for chest pain, palpitations and leg swelling.  Gastrointestinal: Negative for nausea, vomiting, diarrhea, constipation and abdominal distention.   Musculoskeletal: Positive for arthralgias. Negative for joint swelling and gait problem.  Skin: Negative for color change, pallor, rash and wound.  Neurological: Negative for dizziness, tremors, seizures, syncope, facial asymmetry, speech difficulty, weakness, light-headedness, numbness and headaches.  Hematological: Negative for adenopathy. Does not bruise/bleed easily.       Objective:   Physical Exam  Constitutional: She is oriented to person, place, and time. She appears well-developed and well-nourished. No distress.  HENT:  Head: Normocephalic and atraumatic.  Eyes: EOM are normal. Pupils are equal, round, and reactive to light.  Neck: Normal range of motion. Neck supple. No JVD present. No thyromegaly present.  Cardiovascular: Normal rate.   No murmur heard. Pulmonary/Chest: Effort normal and breath sounds normal. No respiratory distress. She has no wheezes. She has no rales. She exhibits no tenderness.  Good air flow heard throughout and no wheezing or crackles auscultated.  Abdominal: Soft. Bowel sounds are normal. She exhibits no distension. There is no tenderness. There is no rebound.  Musculoskeletal: Normal range of motion. She exhibits no edema and no tenderness.  Neurological: She is alert and oriented to person, place, and time. No cranial nerve deficit.  Skin: Skin is warm and dry. No rash noted. She is not diaphoretic. No erythema.       Assessment/Plan:   1. Please see problem oriented charting.  2. Disposition - STOP novolog and lantus. Start humulin 70/30 flexpen 55 units BID. Gave advice regarding increasing exercise and working on diet. She is going to start some water aerobics since she now has medicare  and silver sneakers program. STOP cough drops and stop tussiones. Refer to pulmonology for her cough at her request. Return in 2 months.

## 2013-01-12 NOTE — Assessment & Plan Note (Signed)
Check lipid panel today. Continue pravachol 40 mg daily.

## 2013-01-12 NOTE — Assessment & Plan Note (Signed)
BP Readings from Last 3 Encounters:  01/11/13 149/85  12/07/12 123/86  11/09/12 117/78    Lab Results  Component Value Date   NA 140 06/15/2012   K 3.2* 06/15/2012   CREATININE 0.67 06/15/2012    Assessment: Blood pressure control: mildly elevated Progress toward BP goal:  unchanged Comments: patient is out of losartan and will refill, last BP in range  Plan: Medications:  continue current medications, HCTZ 25 mg daily, losartan 50 mg daily Educational resources provided: brochure;handout;video Self management tools provided:   Other plans:

## 2013-01-14 NOTE — Progress Notes (Signed)
Case discussed with Dr. Kollar soon after the resident saw the patient.  We reviewed the resident's history and exam and pertinent patient test results.  I agree with the assessment, diagnosis, and plan of care documented in the resident's note. 

## 2013-01-15 ENCOUNTER — Other Ambulatory Visit: Payer: Self-pay | Admitting: *Deleted

## 2013-01-15 NOTE — Telephone Encounter (Signed)
Pt needs test strips for a Presto meter.

## 2013-01-16 MED ORDER — GLUCOSE BLOOD VI STRP: ORAL_STRIP | Status: DC

## 2013-01-16 NOTE — Telephone Encounter (Signed)
Pt has called about test strips refill.

## 2013-01-17 MED ORDER — GLUCOSE BLOOD VI STRP: ORAL_STRIP | Status: DC

## 2013-01-17 NOTE — Addendum Note (Signed)
Addended by: Genella Mech A on: 01/17/2013 08:29 PM   Modules accepted: Orders, Medications

## 2013-01-17 NOTE — Telephone Encounter (Signed)
The presto [facility] [place] [facility] and she told me in the office she had accu check and I sent in rx for that meter at our visit last Friday.   I can send in that rx but it will be expensive at [facility].

## 2013-01-18 ENCOUNTER — Other Ambulatory Visit: Payer: Self-pay | Admitting: *Deleted

## 2013-01-18 DIAGNOSIS — K219 Gastro-esophageal reflux disease without esophagitis: Secondary | ICD-10-CM

## 2013-01-21 ENCOUNTER — Telehealth: Payer: Self-pay | Admitting: *Deleted

## 2013-01-21 MED ORDER — RANITIDINE HCL 150 MG PO TABS: 150.0000 mg | ORAL_TABLET | Freq: Two times a day (BID) | ORAL | Status: DC

## 2013-01-21 NOTE — Telephone Encounter (Signed)
Pt c/o cough since December.  Seen by ENT X 2, she was given cough med with codeine, and Nasacort for nose.  If not better she was to call back. Pt was referred to ENT from Dr Dorise Hiss Cough is not better.  Still using cough med and cough drops. She is now waiting for a referral to pulmonology for possible cough variant asthma. Referral is done and [facility] will call tomorrow and see when appointment is.

## 2013-01-23 ENCOUNTER — Emergency Department (HOSPITAL_COMMUNITY): Payer: Medicare Other

## 2013-01-23 ENCOUNTER — Encounter (HOSPITAL_COMMUNITY): Payer: Self-pay | Admitting: Emergency Medicine

## 2013-01-23 ENCOUNTER — Emergency Department (HOSPITAL_COMMUNITY)
Admission: EM | Admit: 2013-01-23 | Discharge: 2013-01-23 | Disposition: A | Discharge status: Home / Self Care | Payer: Medicare Other | Attending: Emergency Medicine | Admitting: Emergency Medicine

## 2013-01-23 DIAGNOSIS — Z8542 Personal history of malignant neoplasm of other parts of uterus: Secondary | ICD-10-CM | POA: Insufficient documentation

## 2013-01-23 DIAGNOSIS — M549 Dorsalgia, unspecified: Secondary | ICD-10-CM | POA: Insufficient documentation

## 2013-01-23 DIAGNOSIS — Z8719 Personal history of other diseases of the digestive system: Secondary | ICD-10-CM | POA: Insufficient documentation

## 2013-01-23 DIAGNOSIS — Z8659 Personal history of other mental and behavioral disorders: Secondary | ICD-10-CM | POA: Insufficient documentation

## 2013-01-23 DIAGNOSIS — G8929 Other chronic pain: Secondary | ICD-10-CM | POA: Insufficient documentation

## 2013-01-23 DIAGNOSIS — E1149 Type 2 diabetes mellitus with other diabetic neurological complication: Secondary | ICD-10-CM | POA: Insufficient documentation

## 2013-01-23 DIAGNOSIS — Z87891 Personal history of nicotine dependence: Secondary | ICD-10-CM | POA: Insufficient documentation

## 2013-01-23 DIAGNOSIS — Z9889 Other specified postprocedural states: Secondary | ICD-10-CM | POA: Insufficient documentation

## 2013-01-23 DIAGNOSIS — E785 Hyperlipidemia, unspecified: Secondary | ICD-10-CM | POA: Insufficient documentation

## 2013-01-23 DIAGNOSIS — R05 Cough: Secondary | ICD-10-CM

## 2013-01-23 DIAGNOSIS — Z79899 Other long term (current) drug therapy: Secondary | ICD-10-CM | POA: Insufficient documentation

## 2013-01-23 DIAGNOSIS — I10 Essential (primary) hypertension: Secondary | ICD-10-CM | POA: Insufficient documentation

## 2013-01-23 DIAGNOSIS — M79609 Pain in unspecified limb: Secondary | ICD-10-CM | POA: Insufficient documentation

## 2013-01-23 DIAGNOSIS — R059 Cough, unspecified: Secondary | ICD-10-CM | POA: Insufficient documentation

## 2013-01-23 DIAGNOSIS — E1142 Type 2 diabetes mellitus with diabetic polyneuropathy: Secondary | ICD-10-CM | POA: Insufficient documentation

## 2013-01-23 DIAGNOSIS — Z794 Long term (current) use of insulin: Secondary | ICD-10-CM | POA: Insufficient documentation

## 2013-01-23 DIAGNOSIS — IMO0002 Reserved for concepts with insufficient information to code with codable children: Secondary | ICD-10-CM | POA: Insufficient documentation

## 2013-01-23 DIAGNOSIS — Z7982 Long term (current) use of aspirin: Secondary | ICD-10-CM | POA: Insufficient documentation

## 2013-01-23 LAB — CBC WITH DIFFERENTIAL/PLATELET
Basophils Relative: 1 % (ref 0–1)
Eosinophils Absolute: 0.2 10*3/uL (ref 0.0–0.7)
HCT: 39.5 % (ref 36.0–46.0)
Hemoglobin: 12.7 g/dL (ref 12.0–15.0)
Lymphs Abs: 2.8 10*3/uL (ref 0.7–4.0)
MCH: 25.7 pg — ABNORMAL LOW (ref 26.0–34.0)
MCHC: 32.2 g/dL (ref 30.0–36.0)
MCV: 79.8 fL (ref 78.0–100.0)
Monocytes Absolute: 0.4 10*3/uL (ref 0.1–1.0)
Monocytes Relative: 6 % (ref 3–12)
Neutrophils Relative %: 43 % (ref 43–77)
RBC: 4.95 MIL/uL (ref 3.87–5.11)

## 2013-01-23 LAB — BASIC METABOLIC PANEL
BUN: 6 mg/dL (ref 6–23)
Chloride: 99 mEq/L (ref 96–112)
Creatinine, Ser: 0.55 mg/dL (ref 0.50–1.10)
GFR calc Af Amer: >90 mL/min (ref >90)
GFR calc non Af Amer: >90 mL/min (ref >90)
Glucose, Bld: 209 mg/dL — ABNORMAL HIGH (ref 70–99)
Potassium: 3.7 mEq/L (ref 3.5–5.1)

## 2013-01-23 MED ORDER — HYDROCODONE-ACETAMINOPHEN 7.5-325 MG/15ML PO SOLN
10.0000 mL | Freq: Once | ORAL | Status: AC
  Administered 2013-01-23: 10 mL via ORAL
  Filled 2013-01-23: qty 15

## 2013-01-23 MED ORDER — PANTOPRAZOLE SODIUM 20 MG PO TBEC: 20.0000 mg | DELAYED_RELEASE_TABLET | Freq: Every day | ORAL | Status: DC

## 2013-01-23 MED ORDER — HYDROCODONE-ACETAMINOPHEN 7.5-325 MG/15ML PO SOLN: 15.0000 mL | Freq: Four times a day (QID) | ORAL | Status: DC | PRN

## 2013-01-23 MED ORDER — ONDANSETRON 4 MG PO TBDP
4.0000 mg | ORAL_TABLET | Freq: Once | ORAL | Status: AC
  Administered 2013-01-23: 4 mg via ORAL
  Filled 2013-01-23: qty 1

## 2013-01-23 MED ORDER — BENZONATATE 100 MG PO CAPS: 100.0000 mg | ORAL_CAPSULE | Freq: Three times a day (TID) | ORAL | Status: DC

## 2013-01-23 NOTE — ED Notes (Signed)
Pt states that she has been feeling as if something is stuck in her throat along with SOB since December.

## 2013-01-23 NOTE — ED Provider Notes (Signed)
Medical screening examination/treatment/procedure(s) were performed by non-physician practitioner and as supervising physician I was immediately available for consultation/collaboration.   Zamari Bonsall B. Jonaven Hilgers, MD 01/23/13 2138 

## 2013-01-23 NOTE — ED Notes (Signed)
C/o feeling of something in throat. Denies pain with swallowing. Visibly dyspneic with ambulation.  States that this happens.  Cough productive of white sputum. Patient alert and cooperative

## 2013-01-23 NOTE — ED Provider Notes (Signed)
CSN: [id number]     Arrival date & time 01/23/13  0845 History     First MD Initiated Contact with Patient 01/23/13 [phone]     Chief Complaint  Patient presents with  . Shortness of Breath   (Consider location/radiation/quality/duration/timing/severity/associated sxs/prior Treatment) HPI [name] is a 52 y.o. female complaining of sensation that "something is sticking in her throat". She has had this sensation since December 2013 but feels like her discomfort is increasing. She has also had cough, productive of white sputum, since December 2013. She was recently evaluated by ENT who rx'd her tussinex for cough and Bactrim for possible infection. She has been taking these medications as well as OTC robitussin and zantac without relief of her discomfort. Denies fever, chills, runny nose, sneezing, sore throat, dysphagia, or odynophagia. 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 NKDA.   Past Medical History  Diagnosis Date  . Chronic back pain   . Hyperlipidemia   . Hypertension   . Uterine fibroid     s/p hysterectomy  . Cocaine abuse     in remission  . Tobacco abuse   . Chronic leg pain     due to back pain  . Diabetes     with neuropathy  . RECTAL BLEEDING 12/09/2008    Annotation: s/p EGD 7/08 mild gastritis, s/p colonoscopy 7/08- benign polyp  s/p polypectomy and isolated diverticulum.  Qualifier: Diagnosis of  By: Ditzler RN, Debra     Past Surgical History  Procedure Laterality Date  . Total abdominal hysterectomy  05/24/2007    hysterectomy  . Breast reduction surgery  1982  . Tubal ligation    . Tonsillectomy  2008  . Back surgery  2012    L5-S1 microendoscopic disectomy last surgery 06/2011   Family History  Problem Relation Age of Onset  . Diabetes Father   . Heart disease Father   . Hypertension Father   . Diabetes Mother   . Cancer Mother     brain  . Hypertension Mother    History  Substance Use Topics  .  Smoking status: Former Smoker    Quit date: 07/23/1998  . Smokeless tobacco: Never Used  . Alcohol Use: No     Comment: recovering addict clean for 5 years   OB History   Grav Para Term Preterm Abortions TAB SAB Ect Mult Living   5    2 2    3      Review of Systems 10 systems reviewed and found to be negative, except as noted in the HPI  Allergies  Review of patient's allergies indicates no known allergies.  Home Medications   Current Outpatient Rx  Name  Route  Sig  Dispense  Refill  . aspirin 81 MG EC tablet   Oral   Take 81 mg by mouth daily.           . Calcium Carbonate-Vitamin D (CALCIUM 600+D) 600-400 MG-UNIT per tablet   Oral   Take 1 tablet by mouth daily.           . chlorpheniramine-HYDROcodone (TUSSIONEX) 10-8 MG/5ML LQCR   Oral   Take 5 mL by mouth every 12 (twelve) hours as needed.         Marland Kitchen esomeprazole (NEXIUM) 20 MG capsule   Oral   Take 1 capsule (20 mg total) by mouth daily.   90 capsule   3   . hydrochlorothiazide (HYDRODIURIL)  25 MG tablet   Oral   Take 1 tablet (25 mg total) by mouth daily.   30 tablet   5   . Insulin Isophane & Regular (HUMULIN 70/30 KWIKPEN) (70-30) 100 UNIT/ML SUPN   Subcutaneous   Inject 55 Units into the skin 2 (two) times daily.   15 mL   3   . losartan (COZAAR) 50 MG tablet   Oral   Take 1 tablet (50 mg total) by mouth daily.   30 tablet   6   . metFORMIN (GLUCOPHAGE) 1000 MG tablet   Oral   Take 1 tablet (1,000 mg total) by mouth 2 (two) times daily with a meal.   60 tablet   3   . mometasone (NASONEX) 50 MCG/ACT nasal spray   Nasal   Place 2 sprays into the nose daily.   17 g   2   . oxyCODONE-acetaminophen (PERCOCET/ROXICET) 5-325 MG per tablet   Oral   Take 1 tablet by mouth every 8 (eight) hours as needed for pain.   90 tablet   0   . pravastatin (PRAVACHOL) 40 MG tablet   Oral   Take 1 tablet (40 mg total) by mouth every evening.   30 tablet   11   . ranitidine (ZANTAC) 150 MG  tablet   Oral   Take 1 tablet (150 mg total) by mouth 2 (two) times daily.   60 tablet   1    BP 143/74  Pulse 89  Temp(Src) 98.2 F (36.8 C) (Oral)  Ht 5\' 4"  (1.626 m)  Wt 248 lb (112.492 kg)  BMI 42.55 kg/m2  SpO2 100% Physical Exam  Nursing note and vitals reviewed. Constitutional: She is oriented to person, place, and time. She appears well-developed and well-nourished. No distress.  HENT:  Head: Normocephalic.  Mouth/Throat: Oropharynx is clear and moist.  No drooling, patient is handling her secretions without issue.  Eyes: Conjunctivae and EOM are normal. Pupils are equal, round, and reactive to light.  Cardiovascular: Normal rate, regular rhythm and intact distal pulses.   Pulmonary/Chest: Effort normal and breath sounds normal. No stridor. No respiratory distress. She has no wheezes. She has no rales. She exhibits no tenderness.  Coughing  Abdominal: Soft. Bowel sounds are normal. She exhibits no distension and no mass. There is no tenderness. There is no rebound and no guarding.  Musculoskeletal: Normal range of motion.  Neurological: She is alert and oriented to person, place, and time.  Psychiatric: She has a normal mood and affect.    ED Course   Procedures (including critical care time)  Labs Reviewed  CBC WITH DIFFERENTIAL - Abnormal; Notable for the following:    MCH 25.7 (*)    Lymphocytes Relative 47 (*)    All other components within normal limits  BASIC METABOLIC PANEL - Abnormal; Notable for the following:    Glucose, Bld 209 (*)    All other components within normal limits   Dg Chest 2 View  01/23/2013   *RADIOLOGY REPORT*  Clinical Data: Shortness of breath  CHEST - 2 VIEW  Comparison: June 15, 2012  Findings:  Lungs clear.  Heart size and pulmonary vascularity are normal.  No adenopathy.  No bone lesions.  IMPRESSION: No abnormality noted.   Original Report Authenticated By: Bretta Bang, M.D.   Dg Esophagus  01/23/2013   *RADIOLOGY  REPORT*  Clinical Data:Coughing, difficulty swallowing  ESOPHAGUS/BARIUM SWALLOW/TABLET STUDY  Fluoroscopy Time: 1 minute  Comparison: None.  Findings: Normal  oral phase of swallowing.  No laryngeal penetration or tracheobronchial aspiration.  Mild esophageal dysmotility with occasional nonperistaltic/tertiary contractions.  No fixed esophageal narrowing or stricture.  A 13 mm barium tablet passed into the stomach without delay.  No evidence of hiatal hernia.  No gastroesophageal reflux was demonstrated.  IMPRESSION: Mild esophageal dysmotility.  Otherwise negative esophagram.   Original Report Authenticated By: Charline Bills, M.D.   1. Chronic cough     MDM   Filed Vitals:   01/23/13 0854 01/23/13 1142  BP: 143/74 139/70  Pulse: 89 71  Temp: 98.2 F (36.8 C) 98.6 F (37 C)  TempSrc: Oral Oral  Resp:  20  Height: 5\' 4"  (1.626 m)   Weight: 248 lb (112.492 kg)   SpO2: 100% 100%     Satoya Feeley is a 52 y.o. female with chronic cough, sensation of foreign body in esophagus, no dysphagia, no drooling, patient ambulates and maintains oxygenation rates above 95%, please note that although the triage note this patient is visibly dyspneic with ambulation and has not case on my physical exam. No Ace-I or ARB use.  Chest x-ray and blood work are reassuring,  barium swallow shows a mild esophageal dysmotility. I will add Tessalon to patient's cough regimen, switch her from Nexium to Protonix and ask her to follow with [facility] pulmonology and Dr. Loreta Ave and gastroenterology  Medications  HYDROcodone-acetaminophen (HYCET) 7.5-325 mg/15 ml solution 10 mL (10 mL Oral Given 01/23/13 1056)  ondansetron (ZOFRAN-ODT) disintegrating tablet 4 mg (4 mg Oral Given 01/23/13 1058)    Pt is hemodynamically stable, appropriate for, and amenable to discharge at this time. Pt verbalized understanding and agrees with care plan. All questions answered. Outpatient follow-up and specific return precautions discussed.     Discharge Medication List as of 01/23/2013 11:16 AM    START taking these medications   Details  benzonatate (TESSALON) 100 MG capsule Take 1 capsule (100 mg total) by mouth every 8 (eight) hours., Starting 01/23/2013, Until Discontinued, Print    HYDROcodone-acetaminophen (HYCET) 7.5-325 mg/15 ml solution Take 15 mL by mouth every 6 (six) hours as needed for cough., Starting 01/23/2013, Until Discontinued, Print    pantoprazole (PROTONIX) 20 MG tablet Take 1 tablet (20 mg total) by mouth daily., Starting 01/23/2013, Until Discontinued, Print        Note: Portions of this report may have been transcribed using voice recognition software. Every effort was made to ensure accuracy; however, inadvertent computerized transcription errors may be present    Wynetta Emery, PA-C 01/23/13 1625

## 2013-02-01 ENCOUNTER — Other Ambulatory Visit: Payer: Self-pay | Admitting: *Deleted

## 2013-02-01 NOTE — Telephone Encounter (Signed)
Pt would like dr Dorise Hiss to call her and discuss getting refills on cough meds prescribed in ed

## 2013-02-04 MED ORDER — BENZONATATE 100 MG PO CAPS: 100.0000 mg | ORAL_CAPSULE | Freq: Three times a day (TID) | ORAL | Status: DC

## 2013-02-04 MED ORDER — GUAIFENESIN-DM 100-10 MG/5ML PO SYRP: 5.0000 mL | ORAL_SOLUTION | Freq: Three times a day (TID) | ORAL | Status: DC | PRN

## 2013-02-04 NOTE — Telephone Encounter (Signed)
She will not be getting any more hycet but will approve more tessalon and robitussin DM. She needs to go back and see ENT.   Dr. Dorise Hiss

## 2013-02-05 NOTE — Telephone Encounter (Signed)
Called to pharm 

## 2013-02-13 ENCOUNTER — Telehealth: Payer: Self-pay | Admitting: *Deleted

## 2013-02-13 NOTE — Telephone Encounter (Signed)
Pt called stating her CBG's have been running high. She started on Humulin 70/30 on 8/1.  She takes 55 units bid. Her readings in morning before breakfast have been Sunday - 230, Monday - 171, Tuesday - 212 and today 213. She did not have night readings but said the are in the 200's  Please advise Pt # [phone]

## 2013-02-14 NOTE — Telephone Encounter (Signed)
Pt instructed and voices understanding. She will report back

## 2013-02-14 NOTE — Telephone Encounter (Signed)
I would encourage her to increase her evening dose to 57 units and keep 55 units in the morning.   Dr. Dorise Hiss

## 2013-02-20 ENCOUNTER — Other Ambulatory Visit: Payer: Self-pay | Admitting: *Deleted

## 2013-02-20 ENCOUNTER — Other Ambulatory Visit: Payer: Self-pay | Admitting: Internal Medicine

## 2013-02-20 DIAGNOSIS — I10 Essential (primary) hypertension: Secondary | ICD-10-CM

## 2013-02-20 MED ORDER — PANTOPRAZOLE SODIUM 20 MG PO TBEC: 20.0000 mg | DELAYED_RELEASE_TABLET | Freq: Two times a day (BID) | ORAL | Status: DC

## 2013-02-20 MED ORDER — HYDROCHLOROTHIAZIDE 25 MG PO TABS: 25.0000 mg | ORAL_TABLET | Freq: Every day | ORAL | Status: DC

## 2013-02-20 MED ORDER — PANTOPRAZOLE SODIUM 20 MG PO TBEC: 20.0000 mg | DELAYED_RELEASE_TABLET | Freq: Every day | ORAL | Status: DC

## 2013-02-20 MED ORDER — INSULIN ISOPHANE & REGULAR (HUMAN 70-30)100 UNIT/ML KWIKPEN: 55.0000 [IU] | PEN_INJECTOR | Freq: Two times a day (BID) | SUBCUTANEOUS | Status: DC

## 2013-02-20 NOTE — Telephone Encounter (Signed)
Also needs to insulin Rx - there were dose changes recently. [facility]. Stanton Kidney Surabhi Gadea RN 02/20/13 2:30PM

## 2013-02-20 NOTE — Telephone Encounter (Signed)
Done, thanks

## 2013-02-21 NOTE — Telephone Encounter (Signed)
Talked with pharmacy - they have all three Rx. Pt aware to call pharmacy after 4PM today - insulin to arrive at pharmacy today.

## 2013-02-25 ENCOUNTER — Encounter: Payer: Self-pay | Admitting: Internal Medicine

## 2013-02-25 ENCOUNTER — Ambulatory Visit (INDEPENDENT_AMBULATORY_CARE_PROVIDER_SITE_OTHER): Payer: Medicare Other | Admitting: Internal Medicine

## 2013-02-25 ENCOUNTER — Other Ambulatory Visit: Payer: Self-pay | Admitting: *Deleted

## 2013-02-25 VITALS — BP 120/70 | HR 84 | Temp 97.6°F | Ht 64.0 in | Wt 250.6 lb

## 2013-02-25 DIAGNOSIS — R05 Cough: Secondary | ICD-10-CM

## 2013-02-25 MED ORDER — PRAVASTATIN SODIUM 40 MG PO TABS: 40.0000 mg | ORAL_TABLET | Freq: Every evening | ORAL | Status: DC

## 2013-02-25 NOTE — Patient Instructions (Addendum)
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  #Sinus  - continue take generic fluticasone inhaler 2 squirts each nostril daily - START 3% nasal saline spray at night 2 squietrs made by a company called [employer]  #ACid reflux  - most important reason for your cough  - folllow advice of Dr Mann 100% without fail  #Irritable larynx  -2- 3 days of complete voice rest without whispering or talking - See [facility]ke of neuro rehab for speech therapy  #followup  - 4-6 weeks with me or my CMA [name] with cough score at followupo - If cough still a problem, will consider CT chest or neck and methacholine challenge test and Rx with neurontin 

## 2013-02-25 NOTE — Telephone Encounter (Signed)
Pharmacy requesting Rx for 90 day supply due to insurance.

## 2013-02-25 NOTE — Progress Notes (Signed)
Subjective:    Patient ID: [name], female    DOB: Aug 10, 1960, 52 y.o.   MRN: [id number] PCP Genella Mech, MD  HPI  IOV 02/25/2013  52 year old nonsmoker obese female. Accompanied by her husband. Chief complaint is chronic cough.  Cough is of insidious onset a year ago. Was severe in intensity. And was progressive. Quality was a dry cough. There is associated ticklish sensation in her throat and constant clearing and gagging. In April 2014 was diagnosed to have esophageal dysmotility and in July 2014 saw Dr. Arty Baumgartner who adjusted her proton pump inhibitor and change her diet. After this in the last several weeks her cough significantly improved and almost resolved. In between he esophagogram in seeing gastroenterologist Dr. Loreta Ave she did see Dr. Dillard Cannon of ENT specialty who apparently did not see any abnormal with her vocal cords but treated her for sinusitis and this did not help her cough.   Cough relevant history   - Sinus/allergies  - Denies any problems. Never on nasal steroids. But does have some postnasal drip  - GI/reflux disease  - July 2008 had endoscopy which showed mild gastritis. She is on proton pump inhibitor  - Body mass index is 42.99 kg/(m^2).  - esophagogram Aug 2014: IMPRESSION:  Mild esophageal dysmotility.  Otherwise negative esophagram.  Original Report Authenticated By: Charline Bills, M.D.   - SAw DR Loreta Ave July 2014 and PPI adjusted and Diet changed-> after this cough resolved   - Hypertension  -  She has hypertension but she is not on ACE inhibitor  - Pulmonary history  -  reports that she quit smoking about 14 years ago. Her smoking use included Cigarettes. She has a 10 pack-year smoking history. She has never used smokeless tobacco.   - CT chest 2006    -   Findings: No dissection is evident. No filling defect is identified in the pulmonary arterial tree to suggest pulmonary embolus. In the lateral basal segment of the right lower lobe  there is some atelectasis adjacent to a small region of nodularity measuring 6 mm in diameter. This nodularity likely simply relates to atelectasis, but a true pulmonary nodule is difficult to exclude. I would recommend follow up limited noncontrast CT in 3 months time in order to reassess this region.  There is no hilar or mediastinal adenopathy.  IMPRESSION:  No embolus. Mild nodularity associated with right lower lobe subsegmental atx; recommend followup limited evaluation in 3 months in order to ensure that this clears or fails to progress. The main purpose of the followup is to rule out the statistically unlikely possibility that this represents early malignancy.    CXR Aug 2013   - clear lung fields   Dr Gretta Cool Reflux Symptom Index (> 13-15 suggestive of LPR cough) 02/25/2013 Score for past 2 months 02/25/2013 Score past severaldays after changing diet  Hoarseness of problem with voice 3 3  Clearing  Of Throat 5 3  Excess throat mucus or feeling of post nasal drip 3 0  Difficulty swallowing food, liquid or tablets 3 \\0   Cough after eating or lying down 3 2  Breathing difficulties or choking episodes 1 0  Troublesome or annoying cough 5 0  Sensation of something sticking in throat or lump in throat 5 3  Heartburn, chest pain, indigestion, or stomach acid coming up 5 1  TOTAL 33 12      Past Medical History  Diagnosis Date  . Chronic back pain   . Hyperlipidemia   .  Hypertension   . Uterine fibroid     s/p hysterectomy  . Cocaine abuse     in remission  . Tobacco abuse   . Chronic leg pain     due to back pain  . Diabetes     with neuropathy  . RECTAL BLEEDING 12/09/2008    Annotation: s/p EGD 7/08 mild gastritis, s/p colonoscopy 7/08- benign polyp  s/p polypectomy and isolated diverticulum.  Qualifier: Diagnosis of  By: Ditzler RN, Debra       Family History  Problem Relation Age of Onset  . Diabetes Father   . Heart disease Father   . Hypertension Father   .  Diabetes Mother   . Cancer Mother     brain  . Hypertension Mother      History   Social History  . Marital Status: Married    Spouse Name: N/A    Number of Children: N/A  . Years of Education: N/A   Occupational History  . Not on file.   Social History Main Topics  . Smoking status: Former Smoker -- 0.50 packs/day for 20 years    Types: Cigarettes    Quit date: 07/23/1998  . Smokeless tobacco: Never Used  . Alcohol Use: No     Comment: recovering addict clean for 5 years  . Drug Use: No     Comment: recovering addict clean for 5 years  . Sexual Activity: Yes    Birth Control/ Protection: Surgical   Other Topics Concern  . Not on file   Social History Narrative   Unemployed but has started taking community college classes (interested in accounting) since having back surgery, which has improved pain and mobility. Hopes to return to work soon. Lives with husband and daughter.  Uninsured.     No Known Allergies   Outpatient Prescriptions Prior to Visit  Medication Sig Dispense Refill  . aspirin 81 MG EC tablet Take 81 mg by mouth daily.        . Calcium Carbonate-Vitamin D (CALCIUM 600+D) 600-400 MG-UNIT per tablet Take 1 tablet by mouth daily.        . hydrochlorothiazide (HYDRODIURIL) 25 MG tablet Take 1 tablet (25 mg total) by mouth daily.  30 tablet  5  . Insulin Isophane & Regular (HUMULIN 70/30 KWIKPEN) (70-30) 100 UNIT/ML SUPN Inject 55-57 Units into the skin 2 (two) times daily. 55 units in the morning and 57 units at night time.  15 mL  3  . losartan (COZAAR) 50 MG tablet Take 1 tablet (50 mg total) by mouth daily.  30 tablet  6  . metFORMIN (GLUCOPHAGE) 1000 MG tablet Take 1 tablet (1,000 mg total) by mouth 2 (two) times daily with a meal.  60 tablet  3  . mometasone (NASONEX) 50 MCG/ACT nasal spray Place 2 sprays into the nose daily.  17 g  2  . Multiple Vitamin (MULTIVITAMIN WITH MINERALS) TABS tablet Take 1 tablet by mouth daily.      Marland Kitchen  oxyCODONE-acetaminophen (PERCOCET/ROXICET) 5-325 MG per tablet Take 1 tablet by mouth every 8 (eight) hours as needed for pain.  90 tablet  0  . pravastatin (PRAVACHOL) 40 MG tablet Take 1 tablet (40 mg total) by mouth every evening.  30 tablet  11  . ranitidine (ZANTAC) 150 MG tablet Take 1 tablet (150 mg total) by mouth 2 (two) times daily.  60 tablet  1  . Throat Lozenges (COUGH DROPS MT) Use as directed 1 lozenge in the  mouth or throat daily as needed (for cough).      . traMADol (ULTRAM) 50 MG tablet Take 50 mg by mouth every 6 (six) hours as needed for pain.      Marland Kitchen albuterol (PROVENTIL HFA;VENTOLIN HFA) 108 (90 BASE) MCG/ACT inhaler Inhale 2 puffs into the lungs every 6 (six) hours as needed for wheezing.      . benzonatate (TESSALON) 100 MG capsule Take 1 capsule (100 mg total) by mouth every 8 (eight) hours.  21 capsule  0  . guaiFENesin-dextromethorphan (ROBITUSSIN DM) 100-10 MG/5ML syrup Take 5 mLs by mouth 3 (three) times daily as needed for cough.  118 mL  0  . HYDROcodone-acetaminophen (HYCET) 7.5-325 mg/15 ml solution Take 15 mL by mouth every 6 (six) hours as needed for cough.  45 mL  0  . pantoprazole (PROTONIX) 20 MG tablet Take 1 tablet (20 mg total) by mouth 2 (two) times daily.  60 tablet  0   No facility-administered medications prior to visit.       Review of Systems  Constitutional: Negative for fever and unexpected weight change.  HENT: Positive for congestion and sore throat. Negative for ear pain, nosebleeds, rhinorrhea, sneezing, trouble swallowing, dental problem, postnasal drip and sinus pressure.   Eyes: Negative for redness and itching.  Respiratory: Positive for cough and shortness of breath. Negative for chest tightness and wheezing.   Cardiovascular: Positive for leg swelling. Negative for palpitations.  Gastrointestinal: Negative for nausea and vomiting.  Genitourinary: Negative for dysuria.  Musculoskeletal: Negative for joint swelling.  Skin: Negative  for rash.  Neurological: Negative for headaches.  Hematological: Does not bruise/bleed easily.  Psychiatric/Behavioral: Negative for dysphoric mood. The patient is not nervous/anxious.        Objective:   Physical Exam  Vitals reviewed. Constitutional: She is oriented to person, place, and time. She appears well-developed and well-nourished. No distress.  Body mass index is 42.99 kg/(m^2).   HENT:  Head: Normocephalic and atraumatic.  Right Ear: External ear normal.  Left Ear: External ear normal.  Mouth/Throat: Oropharynx is clear and moist. No oropharyngeal exudate.  Mild post nasal drip   Eyes: Conjunctivae and EOM are normal. Pupils are equal, round, and reactive to light. Right eye exhibits no discharge. Left eye exhibits no discharge. No scleral icterus.  Neck: Normal range of motion. Neck supple. No JVD present. No tracheal deviation present. No thyromegaly present.  Cardiovascular: Normal rate, regular rhythm, normal heart sounds and intact distal pulses.  Exam reveals no gallop and no friction rub.   No murmur heard. Pulmonary/Chest: Effort normal and breath sounds normal. No respiratory distress. She has no wheezes. She has no rales. She exhibits no tenderness.  Abdominal: Soft. Bowel sounds are normal. She exhibits no distension and no mass. There is no tenderness. There is no rebound and no guarding.  Musculoskeletal: Normal range of motion. She exhibits no edema and no tenderness.  Lymphadenopathy:    She has no cervical adenopathy.  Neurological: She is alert and oriented to person, place, and time. She has normal reflexes. No cranial nerve deficit. She exhibits normal muscle tone. Coordination normal.  Skin: Skin is warm and dry. No rash noted. She is not diaphoretic. No erythema. No pallor.  Psychiatric: She has a normal mood and affect. Her behavior is normal. Judgment and thought content normal.          Assessment & Plan:

## 2013-02-27 NOTE — Assessment & Plan Note (Signed)
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  #Sinus  - continue take generic fluticasone inhaler 2 squirts each nostril daily - START 3% nasal saline spray at night 2 squietrs made by a company called [employer]  #ACid reflux  - most important reason for your cough  - folllow advice of Dr Loreta Ave 100% without fail  #Irritable larynx  -2- 3 days of complete voice rest without whispering or talking - See MR CArl Schinke of neuro rehab for speech therapy  #followup  - 4-6 weeks with me or my CMA [name] with cough score at followupo - If cough still a problem, will consider CT chest or neck and methacholine challenge test and Rx with neurontin

## 2013-02-28 ENCOUNTER — Telehealth: Payer: Self-pay | Admitting: Internal Medicine

## 2013-02-28 ENCOUNTER — Telehealth: Payer: Self-pay | Admitting: *Deleted

## 2013-02-28 MED ORDER — ALBUTEROL SULFATE HFA 108 (90 BASE) MCG/ACT IN AERS: 2.0000 | INHALATION_SPRAY | RESPIRATORY_TRACT | Status: DC | PRN

## 2013-02-28 MED ORDER — ACYCLOVIR 400 MG PO TABS: 400.0000 mg | ORAL_TABLET | Freq: Three times a day (TID) | ORAL | Status: DC

## 2013-02-28 NOTE — Telephone Encounter (Signed)
Call from pt - requesting rx for Acyclovir 400mg  tabs; states she's having a break out of herpes.  Thanks

## 2013-02-28 NOTE — Telephone Encounter (Signed)
Last ov w/ MR 9.15.14 for consult Ventolin is active med on list Rx sent to [facility] Pt is aware Nothing further needed; will sign off.

## 2013-02-28 NOTE — Telephone Encounter (Signed)
Pt called and informed of refill. 

## 2013-02-28 NOTE — Telephone Encounter (Signed)
Acyclovir 400 mg TID times 5 days ordered and sent in.  Thanks,  Dr. Dorise Hiss

## 2013-03-08 ENCOUNTER — Encounter: Payer: Self-pay | Admitting: Internal Medicine

## 2013-03-08 ENCOUNTER — Ambulatory Visit (INDEPENDENT_AMBULATORY_CARE_PROVIDER_SITE_OTHER): Payer: Medicare Other | Admitting: Internal Medicine

## 2013-03-08 ENCOUNTER — Ambulatory Visit: Payer: Medicare Other | Attending: Internal Medicine

## 2013-03-08 VITALS — BP 148/76 | HR 86 | Temp 98.3°F | Ht 64.0 in | Wt 249.5 lb

## 2013-03-08 DIAGNOSIS — Z23 Encounter for immunization: Secondary | ICD-10-CM

## 2013-03-08 DIAGNOSIS — M549 Dorsalgia, unspecified: Secondary | ICD-10-CM

## 2013-03-08 DIAGNOSIS — E1149 Type 2 diabetes mellitus with other diabetic neurological complication: Secondary | ICD-10-CM

## 2013-03-08 DIAGNOSIS — R059 Cough, unspecified: Secondary | ICD-10-CM | POA: Insufficient documentation

## 2013-03-08 DIAGNOSIS — IMO0001 Reserved for inherently not codable concepts without codable children: Secondary | ICD-10-CM | POA: Insufficient documentation

## 2013-03-08 DIAGNOSIS — R053 Chronic cough: Secondary | ICD-10-CM

## 2013-03-08 DIAGNOSIS — E1140 Type 2 diabetes mellitus with diabetic neuropathy, unspecified: Secondary | ICD-10-CM

## 2013-03-08 DIAGNOSIS — Z0000 Encounter for general adult medical examination without abnormal findings: Secondary | ICD-10-CM

## 2013-03-08 DIAGNOSIS — R05 Cough: Secondary | ICD-10-CM | POA: Insufficient documentation

## 2013-03-08 DIAGNOSIS — I10 Essential (primary) hypertension: Secondary | ICD-10-CM

## 2013-03-08 DIAGNOSIS — E1142 Type 2 diabetes mellitus with diabetic polyneuropathy: Secondary | ICD-10-CM

## 2013-03-08 DIAGNOSIS — R498 Other voice and resonance disorders: Secondary | ICD-10-CM | POA: Insufficient documentation

## 2013-03-08 LAB — GLUCOSE, CAPILLARY: Glucose-Capillary: 221 mg/dL — ABNORMAL HIGH (ref 70–99)

## 2013-03-08 LAB — POCT GLYCOSYLATED HEMOGLOBIN (HGB A1C): Hemoglobin A1C: 8.3

## 2013-03-08 MED ORDER — PANTOPRAZOLE SODIUM 40 MG PO TBEC: 40.0000 mg | DELAYED_RELEASE_TABLET | Freq: Two times a day (BID) | ORAL | Status: DC

## 2013-03-08 MED ORDER — OXYCODONE-ACETAMINOPHEN 5-325 MG PO TABS: 1.0000 | ORAL_TABLET | Freq: Three times a day (TID) | ORAL | Status: DC | PRN

## 2013-03-08 MED ORDER — INSULIN ISOPHANE & REGULAR (HUMAN 70-30)100 UNIT/ML KWIKPEN: 57.0000 [IU] | PEN_INJECTOR | Freq: Two times a day (BID) | SUBCUTANEOUS | Status: DC

## 2013-03-08 NOTE — Assessment & Plan Note (Signed)
Lab Results  Component Value Date   HGBA1C 8.3 03/08/2013   HGBA1C 8.2 12/07/2012   HGBA1C 7.9 09/04/2012     Assessment: Diabetes control: fair control Progress toward A1C goal:  unchanged Comments: She is eating a lot of cough drops with sugar  Plan: Medications:  increase humulin 70/30 to 57 units q AM and 58 units q PM. Continue metformin 1000 mg BID Home glucose monitoring: Frequency: once a day Timing: before breakfast Instruction/counseling given: reminded to bring blood glucose meter & log to each visit, discussed foot care and discussed diet Educational resources provided:   Self management tools provided: instructions for home glucose monitoring Other plans:

## 2013-03-08 NOTE — Progress Notes (Signed)
Subjective:     Patient ID: [name], female   DOB: 03-17-1961, 52 y.o.   MRN: [id number]  HPI  The patient is a 52 YO woman with PMH of HTN, DM II, hyperlipidemia, chronic back pain who presents for routine follow up visit. She has seen the lung doctor about her cough and he feels that she would benefit from constant PPI therapy and nasal hygiene. She has been trying to perform the nasal hygiene daily but it is difficult to keep up with. She has seen ENT in the past as well. She would like her flu shot today and is still having her back pain. She did not bring her meter in today for review. She states that most of her morning sugars (the only time of day she checks her sugars) have been around 200 with today's at 138. She has not noticed any hypoglycemic or hyperglycemic episodes. She is not having problems with chest pain or SOB. She is continuing to take her medications as prescribed. She is taking some over the counter cough medicine and feels her cough is returned. No fevers or chills at home.   Review of Systems  Constitutional: Negative for fever, chills, diaphoresis, activity change, appetite change, fatigue and unexpected weight change.  HENT: Negative for hearing loss, ear pain, nosebleeds, congestion, sore throat, facial swelling, rhinorrhea, sneezing, drooling, trouble swallowing, neck pain, neck stiffness, dental problem, postnasal drip, sinus pressure, tinnitus and ear discharge.   Eyes: Negative.   Respiratory: Positive for cough. Negative for choking, chest tightness, shortness of breath, wheezing and stridor.   Cardiovascular: Negative for chest pain, palpitations and leg swelling.  Gastrointestinal: Negative for nausea, vomiting, diarrhea, constipation and abdominal distention.  Musculoskeletal: Positive for arthralgias. Negative for joint swelling and gait problem.  Skin: Negative for color change, pallor, rash and wound.  Neurological: Negative for dizziness, tremors, seizures,  syncope, facial asymmetry, speech difficulty, weakness, light-headedness, numbness and headaches.  Hematological: Negative for adenopathy. Does not bruise/bleed easily.       Objective:   Physical Exam  Constitutional: She is oriented to person, place, and time. She appears well-developed and well-nourished. No distress.  HENT:  Head: Normocephalic and atraumatic.  Mouth/Throat: Oropharynx is clear and moist.  No tonsils and no discharge or erythema at the back of the throat.   Eyes: EOM are normal. Pupils are equal, round, and reactive to light.  Neck: Normal range of motion. Neck supple. No JVD present. No thyromegaly present.  Cardiovascular: Normal rate.   No murmur heard. Pulmonary/Chest: Effort normal and breath sounds normal. No respiratory distress. She has no wheezes. She has no rales. She exhibits no tenderness.  Good air flow heard throughout and no wheezing or crackles auscultated.  Abdominal: Soft. Bowel sounds are normal. She exhibits no distension. There is no tenderness. There is no rebound.  Musculoskeletal: Normal range of motion. She exhibits no edema and no tenderness.  Neurological: She is alert and oriented to person, place, and time. No cranial nerve deficit.  Skin: Skin is warm and dry. No rash noted. She is not diaphoretic. No erythema.       Assessment/Plan:   1. Please see problem oriented charting.  2. Disposition - Humulin 70/30 flexpen 52 units q AM and 52 units q PM. Given flu shot and checked HgA1c and did foot exam. She will continue to follow up pulmonology and will return to [place] in 3 months. Will not adjust BP meds as she was taking over the counter  cough medicine and has been previously well controlled on her regimen. Advised her to stop over the counter meds with DM part.

## 2013-03-08 NOTE — Assessment & Plan Note (Signed)
BP Readings from Last 3 Encounters:  03/08/13 148/76  02/25/13 120/70  01/23/13 139/70    Lab Results  Component Value Date   NA 139 01/23/2013   K 3.7 01/23/2013   CREATININE 0.55 01/23/2013    Assessment: Blood pressure control: mildly elevated Progress toward BP goal:  deteriorated Comments: Likely from taking OTC cough meds  Plan: Medications:  continue current medications, HCTZ 25 mg daily, losartan 50 mg daily Educational resources provided:   Self management tools provided: instructions for home blood pressure monitoring Other plans: If still high at next visit will add third agent.

## 2013-03-08 NOTE — Patient Instructions (Signed)
General Instructions:  We have checked your feet today and given you your flu shot.  We will have you check your sugars more frequently the next 5-7 days and call us with the results. Change your insulin to 57 units in the morning and 58 units at night time.   You can try the mucinex WITHOUT the DM over the counter for cough.  We will have you come back in 3 months for a follow up.   Treatment Goals:  Goals (1 Years of Data) as of 03/08/13         As of Today 02/25/13 01/23/13 01/23/13 01/11/13     Blood Pressure    . Blood Pressure < 140/90  148/76 120/70 139/70 143/74 149/85     Result Component    . HEMOGLOBIN A1C < 7.0  8.3        . LDL CALC < 100      94      Progress Toward Treatment Goals:  Treatment Goal 03/08/2013  Hemoglobin A1C unchanged  Blood pressure deteriorated    Self Care Goals & Plans:  Self Care Goal 03/08/2013  Manage my medications take my medicines as prescribed; bring my medications to every visit; refill my medications on time  Monitor my health keep track of my blood glucose; bring my glucose meter and log to each visit; check my feet daily  Eat healthy foods eat foods that are low in salt; eat baked foods instead of fried foods; drink diet soda or water instead of juice or soda; eat fruit for snacks and desserts  Be physically active take a walk every day    Home Blood Glucose Monitoring 03/08/2013  Check my blood sugar once a day  When to check my blood sugar before breakfast     Care Management & Community Referrals:  Referral 03/08/2013  Referrals made for care management support none needed  Referrals made to community resources -

## 2013-03-08 NOTE — Assessment & Plan Note (Signed)
Increased protonix to 40 mg BID for suppression if acid reflex is the cause. She may need metcholine challenge test and per the patient had some asthma as a child. She will follow up with pulmonology in mid October.

## 2013-03-11 ENCOUNTER — Telehealth: Payer: Self-pay | Admitting: Internal Medicine

## 2013-03-11 NOTE — Progress Notes (Signed)
Case discussed with Dr. Kollar at the time of the visit.  We reviewed the resident's history and exam and pertinent patient test results.  I agree with the assessment, diagnosis, and plan of care documented in the resident's note.     

## 2013-03-11 NOTE — Telephone Encounter (Signed)
lmomtcb x1 for [name]

## 2013-03-12 NOTE — Telephone Encounter (Signed)
I spoke with Alcario Drought and nothing further needed at this time. Carron Curie, CMA

## 2013-03-19 ENCOUNTER — Ambulatory Visit: Payer: Medicare Other | Attending: Internal Medicine | Admitting: Speech Pathology

## 2013-03-19 DIAGNOSIS — R059 Cough, unspecified: Secondary | ICD-10-CM | POA: Insufficient documentation

## 2013-03-19 DIAGNOSIS — IMO0001 Reserved for inherently not codable concepts without codable children: Secondary | ICD-10-CM | POA: Insufficient documentation

## 2013-03-19 DIAGNOSIS — R05 Cough: Secondary | ICD-10-CM | POA: Insufficient documentation

## 2013-03-19 DIAGNOSIS — R498 Other voice and resonance disorders: Secondary | ICD-10-CM | POA: Insufficient documentation

## 2013-03-21 ENCOUNTER — Ambulatory Visit: Payer: Medicare Other

## 2013-03-22 ENCOUNTER — Other Ambulatory Visit: Payer: Self-pay | Admitting: Internal Medicine

## 2013-03-26 ENCOUNTER — Ambulatory Visit: Payer: Medicare Other

## 2013-03-28 ENCOUNTER — Ambulatory Visit: Payer: Medicare Other | Admitting: Speech Pathology

## 2013-04-01 ENCOUNTER — Other Ambulatory Visit: Payer: Self-pay | Admitting: *Deleted

## 2013-04-01 MED ORDER — LOSARTAN POTASSIUM 50 MG PO TABS: 50.0000 mg | ORAL_TABLET | Freq: Every day | ORAL | Status: DC

## 2013-04-02 ENCOUNTER — Ambulatory Visit: Payer: Medicare Other

## 2013-04-03 ENCOUNTER — Telehealth: Payer: Self-pay | Admitting: *Deleted

## 2013-04-03 DIAGNOSIS — E1149 Type 2 diabetes mellitus with other diabetic neurological complication: Secondary | ICD-10-CM

## 2013-04-03 NOTE — Telephone Encounter (Signed)
Patient called [employer] about the cost of her insulin: was 30$, today was 47$ and was told that she has reached her quantity limit ( ? Donut hole) and next month it will be more than 100$. Patient has ~ 10-15 days of insulin left. Plan was to have [place] sen din a corrected prescription for 45 mL, she will see how much it will cost for her to get a refill and if it is unaffordable she should call us. We will a  sk Dr. Dorise Hiss if  She is willing to substitute Novolog Mix 70/30 for the humulin 70/30 in this patient. Suggest patient be referred to [employer] and she can call them to find out what papers she'll need for them to assist her.

## 2013-04-03 NOTE — Telephone Encounter (Signed)
Pt given telephone# to [employer] to see if they can help her w/insulin and what they will need for her to do.

## 2013-04-03 NOTE — Telephone Encounter (Signed)
Message left by pt - requesting Humulin 70/30 insulin sample; states she is running out of insulin and her insurance will not pay ($150.00+). Talked to Tobey Bride. - pt needs at least 45ml per month to cover 57-58 units BID.

## 2013-04-04 ENCOUNTER — Ambulatory Visit: Payer: Medicare Other

## 2013-04-04 NOTE — Telephone Encounter (Signed)
The original reason we had her on humulin 70/30 was cost. She initially was on novolog 70/30 and was unable to afford it and humulin has overall been better for her with regard to cost.

## 2013-04-09 ENCOUNTER — Ambulatory Visit: Payer: Medicare Other | Admitting: Adult Health

## 2013-04-09 ENCOUNTER — Ambulatory Visit: Payer: Medicare Other

## 2013-04-17 ENCOUNTER — Ambulatory Visit: Payer: Medicare Other | Admitting: Adult Health

## 2013-04-23 ENCOUNTER — Ambulatory Visit (INDEPENDENT_AMBULATORY_CARE_PROVIDER_SITE_OTHER): Payer: Medicare Other | Admitting: Adult Health

## 2013-04-23 ENCOUNTER — Encounter: Payer: Self-pay | Admitting: Internal Medicine

## 2013-04-23 ENCOUNTER — Encounter (INDEPENDENT_AMBULATORY_CARE_PROVIDER_SITE_OTHER): Payer: Self-pay

## 2013-04-23 ENCOUNTER — Encounter: Payer: Self-pay | Admitting: Adult Health

## 2013-04-23 VITALS — BP 108/62 | HR 93 | Temp 98.6°F | Ht 64.0 in | Wt 251.6 lb

## 2013-04-23 DIAGNOSIS — R05 Cough: Secondary | ICD-10-CM

## 2013-04-23 MED ORDER — BENZONATATE 200 MG PO CAPS: 200.0000 mg | ORAL_CAPSULE | Freq: Three times a day (TID) | ORAL | Status: DC | PRN

## 2013-04-23 NOTE — Assessment & Plan Note (Addendum)
Cyclical cough  ? AR/GERD triggers   Plan  Begin Delsym 2 tsp Twice daily   Begin Tessalon 200mg  Three times a day   Begin Chlortrimeton 4mg  1 in am and 2 At bedtime  -may make you sleepy.  Continue on Protonix daily before meal  Continue on Zantac At bedtime   Work on not coughing or throat clearing.  Please contact office for sooner follow up if symptoms do not improve or worsen or seek emergency care  follow up Dr. Marchelle Gearing in 4-6 weeks and As needed

## 2013-04-23 NOTE — Progress Notes (Signed)
Subjective:    Patient ID: [name], female    DOB: [birth year], 52 y.o.   MRN: [id number] PCP Genella Mech, MD  HPI  IOV 02/25/2013  52 year old nonsmoker obese female. Accompanied by her husband. Chief complaint is chronic cough.  Cough is of insidious onset a year ago. Was severe in intensity. And was progressive. Quality was a dry cough. There is associated ticklish sensation in her throat and constant clearing and gagging. In April 2014 was diagnosed to have esophageal dysmotility and in July 2014 saw Dr. Arty Baumgartner who adjusted her proton pump inhibitor and change her diet. After this in the last several weeks her cough significantly improved and almost resolved. In between he esophagogram in seeing gastroenterologist Dr. Loreta Ave she did see Dr. Dillard Cannon of ENT specialty who apparently did not see any abnormal with her vocal cords but treated her for sinusitis and this did not help her cough.   Cough relevant history   - Sinus/allergies  - Denies any problems. Never on nasal steroids. But does have some postnasal drip  - GI/reflux disease  - July 2008 had endoscopy which showed mild gastritis. She is on proton pump inhibitor  - Body mass index is 42.99 kg/(m^2).  - esophagogram Aug 2014: IMPRESSION:  Mild esophageal dysmotility.  Otherwise negative esophagram.  Original Report Authenticated By: Charline Bills, M.D.   - SAw DR Loreta Ave July 2014 and PPI adjusted and Diet changed-> after this cough resolved   - Hypertension  -  She has hypertension but she is not on ACE inhibitor  - Pulmonary history  -  reports that she quit smoking about 14 years ago. Her smoking use included Cigarettes. She has a 10 pack-year smoking history. She has never used smokeless tobacco.   - CT chest 2006    -   Findings: No dissection is evident. No filling defect is identified in the pulmonary arterial tree to suggest pulmonary embolus. In the lateral basal segment of the right lower lobe  there is some atelectasis adjacent to a small region of nodularity measuring 6 mm in diameter. This nodularity likely simply relates to atelectasis, but a true pulmonary nodule is difficult to exclude. I would recommend follow up limited noncontrast CT in 3 months time in order to reassess this region.  There is no hilar or mediastinal adenopathy.  IMPRESSION:  No embolus. Mild nodularity associated with right lower lobe subsegmental atx; recommend followup limited evaluation in 3 months in order to ensure that this clears or fails to progress. The main purpose of the followup is to rule out the statistically unlikely possibility that this represents early malignancy.    CXR Aug 2013   - clear lung fields   Dr Gretta Cool Reflux Symptom Index (> 13-15 suggestive of LPR cough) 02/25/2013 Score for past 2 months 02/25/2013 Score past severaldays after changing diet  Hoarseness of problem with voice 3 3  Clearing  Of Throat 5 3  Excess throat mucus or feeling of post nasal drip 3 0  Difficulty swallowing food, liquid or tablets 3 \\0   Cough after eating or lying down 3 2  Breathing difficulties or choking episodes 1 0  Troublesome or annoying cough 5 0  Sensation of something sticking in throat or lump in throat 5 3  Heartburn, chest pain, indigestion, or stomach acid coming up 5 1  TOTAL 33 12     04/23/2013 Follow up Cough  6 week follow up - reports cough improves for a few  days, then worsens again.Marland Kitchen Cough seems to wax and wane .  Not using anything to control cough .  Has sinus drip and drainage esp at night.  No fever, discolored mucus, chest pain, orthopnea or edema.  CXR 01/2013 with no acute findings.  [name] Cough score 23 today .    Review of Systems  Constitutional: Negative for fever and unexpected weight change.  HENT:  . Negative for ear pain, nosebleeds, rhinorrhea, sneezing, trouble swallowing, dental problem,  +postnasal drip Eyes: Negative for redness and itching.   Respiratory: Positive for cough   Negative for chest tightness and wheezing.   Cardiovascular: . Negative for palpitations.  Gastrointestinal: Negative for nausea and vomiting.  Genitourinary: Negative for dysuria.  Musculoskeletal: Negative for joint swelling.  Skin: Negative for rash.  Neurological: Negative for headaches.  Hematological: Does not bruise/bleed easily.  Psychiatric/Behavioral: Negative for dysphoric mood. The patient is not nervous/anxious.        Objective:   Physical Exam  Vitals reviewed.   HENT:  Head: Normocephalic and atraumatic.  Right Ear: External ear normal.  Left Ear: External ear normal.  Mouth/Throat: Oropharynx is clear and moist. No oropharyngeal exudate.  Mild post nasal drip   Eyes: Conjunctivae and EOM are normal. Pupils are equal, round, and reactive to light. Right eye exhibits no discharge. Left eye exhibits no discharge. No scleral icterus.  Neck: Normal range of motion. Neck supple. No JVD present. No tracheal deviation present. No thyromegaly present.  Cardiovascular: Normal rate, regular rhythm, normal heart sounds and intact distal pulses.  Exam reveals no gallop and no friction rub.   No murmur heard. Pulmonary/Chest: Effort normal and breath sounds normal. No respiratory distress. She has no wheezes. She has no rales. She exhibits no tenderness.  Abdominal: Soft. Bowel sounds are normal. She exhibits no distension and no mass. There is no tenderness. There is no rebound and no guarding.  Musculoskeletal: Normal range of motion. She exhibits no edema and no tenderness.  Lymphadenopathy:    She has no cervical adenopathy.  Neurological: She is alert and oriented to person, place, and time. She has normal reflexes. No cranial nerve deficit. She exhibits normal muscle tone. Coordination normal.  Skin: Skin is warm and dry. No rash noted. She is not diaphoretic. No erythema. No pallor.  Psychiatric: She has a normal mood and affect. Her  behavior is normal. Judgment and thought content normal.          Assessment & Plan:

## 2013-04-23 NOTE — Patient Instructions (Signed)
Begin Delsym 2 tsp Twice daily   Begin Tessalon 200mg  Three times a day   Begin Chlortrimeton 4mg  1 in am and 2 At bedtime  -may make you sleepy.  Continue on Protonix daily before meal  Continue on Zantac At bedtime   Work on not coughing or throat clearing.  Please contact office for sooner follow up if symptoms do not improve or worsen or seek emergency care  follow up Dr. Marchelle Gearing in 4-6 weeks and As needed

## 2013-04-24 ENCOUNTER — Ambulatory Visit: Payer: Medicare Other

## 2013-04-24 ENCOUNTER — Telehealth: Payer: Self-pay | Admitting: Adult Health

## 2013-04-24 NOTE — Telephone Encounter (Signed)
After speaking with [name] about this; [name] received a fax about this-insurance does not want to cover the 200 mg dose and wants patient to have 100 mg dose. I will forward to JJ to speak with TP about this. Pt is aware of this and that we will call once Rx has been taken care of.

## 2013-04-25 ENCOUNTER — Telehealth: Payer: Self-pay | Admitting: Internal Medicine

## 2013-04-25 NOTE — Telephone Encounter (Signed)
[employer], spoke with Tia pharm tech to see if the voicemail that I left earlier has been received.  Per Tia, the pharmacist will be checking this "in a little bit"  LMOM TCB x1 for patient to inform her of this.

## 2013-04-25 NOTE — Telephone Encounter (Signed)
Pt calling again in ref to previous msg can be reached at [phone].[name]

## 2013-04-25 NOTE — Telephone Encounter (Signed)
Spoke with the pt and notified her of the below recs  She verbalized understanding and states nothing further needed

## 2013-04-25 NOTE — Telephone Encounter (Signed)
Per TP: okay to change to Benzonatate 100mg  1-2 by mouth TID prn cough.  Thanks.  Called [facility], spoke with Shanda Bumps who requested this change be left on the pharmacy voice mail.  Done.   Left detailed message on named voice mail informing her of the change.  Asked pt to please call with questions/concerns.

## 2013-04-26 NOTE — Telephone Encounter (Signed)
Pt aware. [name], CMA  

## 2013-05-01 ENCOUNTER — Ambulatory Visit: Payer: Medicare Other | Attending: Internal Medicine

## 2013-05-01 DIAGNOSIS — R059 Cough, unspecified: Secondary | ICD-10-CM | POA: Insufficient documentation

## 2013-05-01 DIAGNOSIS — R498 Other voice and resonance disorders: Secondary | ICD-10-CM | POA: Insufficient documentation

## 2013-05-01 DIAGNOSIS — IMO0001 Reserved for inherently not codable concepts without codable children: Secondary | ICD-10-CM | POA: Insufficient documentation

## 2013-05-01 DIAGNOSIS — R05 Cough: Secondary | ICD-10-CM | POA: Insufficient documentation

## 2013-05-02 ENCOUNTER — Other Ambulatory Visit: Payer: Self-pay | Admitting: *Deleted

## 2013-05-02 MED ORDER — MOMETASONE FUROATE 50 MCG/ACT NA SUSP: 2.0000 | Freq: Every day | NASAL | Status: DC

## 2013-05-02 NOTE — Telephone Encounter (Signed)
Rx faxed in.

## 2013-05-06 ENCOUNTER — Other Ambulatory Visit: Payer: Self-pay | Admitting: *Deleted

## 2013-05-06 DIAGNOSIS — M549 Dorsalgia, unspecified: Secondary | ICD-10-CM

## 2013-05-06 MED ORDER — OXYCODONE-ACETAMINOPHEN 5-325 MG PO TABS: 1.0000 | ORAL_TABLET | Freq: Three times a day (TID) | ORAL | Status: DC | PRN

## 2013-05-06 NOTE — Telephone Encounter (Signed)
Will not do 3 months as she needs to be seen and need her to keep apt in Dec for refills.

## 2013-05-06 NOTE — Telephone Encounter (Signed)
3 months worth of scripts if you would like

## 2013-05-06 NOTE — Telephone Encounter (Signed)
Have tried all ph# to get pt, left vmail at [phone].

## 2013-05-07 ENCOUNTER — Telehealth: Payer: Self-pay | Admitting: *Deleted

## 2013-05-07 MED ORDER — INSULIN LISPRO PROT & LISPRO (75-25 MIX) 100 UNIT/ML KWIKPEN: 58.0000 [IU] | PEN_INJECTOR | Freq: Two times a day (BID) | SUBCUTANEOUS | Status: DC

## 2013-05-07 NOTE — Telephone Encounter (Signed)
Yes, sent in humalog 75/25 and she will take 58 units BID [place]. Thanks!  Dr. Dorise Hiss

## 2013-05-07 NOTE — Telephone Encounter (Signed)
[facility], [facility] have an offer of humalog 75/25 or 50/50 the pt will be able to get appr 13 days free but it must be 75/25 or 50/50. Can we do this if so send electronically

## 2013-05-08 ENCOUNTER — Ambulatory Visit: Payer: Medicare Other

## 2013-05-21 ENCOUNTER — Encounter: Payer: Self-pay | Admitting: Internal Medicine

## 2013-05-21 ENCOUNTER — Encounter: Payer: Self-pay | Admitting: *Deleted

## 2013-05-21 ENCOUNTER — Ambulatory Visit (INDEPENDENT_AMBULATORY_CARE_PROVIDER_SITE_OTHER): Payer: Medicare Other | Admitting: Internal Medicine

## 2013-05-21 VITALS — BP 140/82 | HR 83 | Ht 64.0 in | Wt 248.8 lb

## 2013-05-21 DIAGNOSIS — R05 Cough: Secondary | ICD-10-CM

## 2013-05-21 DIAGNOSIS — R059 Cough, unspecified: Secondary | ICD-10-CM

## 2013-05-21 NOTE — Patient Instructions (Addendum)
I wish your cough was better but unfortunately it is not Time to move to next testing phase  - do sinus ct without contrast  - do High Resolution CT chest without contrast on ILD protocol. Only  Dr Leanna Battles or Dr. 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

## 2013-05-21 NOTE — Assessment & Plan Note (Signed)
I wish your cough was better but unfortunately it is not Time to move to next testing phase  - do sinus ct without contrast  - do High Resolution CT chest without contrast on ILD protocol. Only  Dr Leanna Battles or Dr. Trudie Reed to read  - do methacholine challenge test Meanwhile, continue sinus, and acid reflux and speech therapy measures REturn  - to see me or NP after finishing above tests but wihtin a month  - consider urine tox for cocaine at followup depmdog on  hx

## 2013-05-21 NOTE — Progress Notes (Signed)
Subjective:    Patient ID: [name], female    DOB: 25-Oct-1960, 52 y.o.   MRN: [id number]  HPI PCP Genella Mech, MD  HPI  IOV 02/25/2013  52 year old nonsmoker obese female. Accompanied by her husband. Chief complaint is chronic cough.  Cough is of insidious onset a year ago. Was severe in intensity. And was progressive. Quality was a dry cough. There is associated ticklish sensation in her throat and constant clearing and gagging. In April 2014 was diagnosed to have esophageal dysmotility and in July 2014 saw Dr. Arty Baumgartner who adjusted her proton pump inhibitor and change her diet. After this in the last several weeks her cough significantly improved and almost resolved. In between he esophagogram in seeing gastroenterologist Dr. Loreta Ave she did see Dr. Dillard Cannon of ENT specialty who apparently did not see any abnormal with her vocal cords but treated her for sinusitis and this did not help her cough.   Cough relevant history   - Sinus/allergies  - Denies any problems. Never on nasal steroids. But does have some postnasal drip  - GI/reflux disease  - July 2008 had endoscopy which showed mild gastritis. She is on proton pump inhibitor  - Body mass index is 42.99 kg/(m^2).  - esophagogram Aug 2014: IMPRESSION:  Mild esophageal dysmotility.  Otherwise negative esophagram.  Original Report Authenticated By: Charline Bills, M.D.   - SAw DR Loreta Ave July 2014 and PPI adjusted and Diet changed-> after this cough resolved   - Hypertension  -  She has hypertension but she is not on ACE inhibitor  - Pulmonary history  -  reports that she quit smoking about 14 years ago. Her smoking use included Cigarettes. She has a 10 pack-year smoking history. She has never used smokeless tobacco.   - CT chest 2006    -   Findings: No dissection is evident. No filling defect is identified in the pulmonary arterial tree to suggest pulmonary embolus. In the lateral basal segment of the right  lower lobe there is some atelectasis adjacent to a small region of nodularity measuring 6 mm in diameter. This nodularity likely simply relates to atelectasis, but a true pulmonary nodule is difficult to exclude. I would recommend follow up limited noncontrast CT in 3 months time in order to reassess this region.  There is no hilar or mediastinal adenopathy.  IMPRESSION:  No embolus. Mild nodularity associated with right lower lobe subsegmental atx; recommend followup limited evaluation in 3 months in order to ensure that this clears or fails to progress. The main purpose of the followup is to rule out the statistically unlikely possibility that this represents early malignancy.    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  #Sinus  - continue take generic fluticasone inhaler 2 squirts each nostril daily - START 3% nasal saline spray at night 2 squietrs made by a company called [employer]  #ACid reflux  - most important reason for your cough  - folllow advice of Dr Loreta Ave 100% without fail  #Irritable larynx  -2- 3 days of complete voice rest without whispering or talking - See [facility] CArl Schinke of neuro rehab for speech therapy  #followup  - 4-6 weeks with me or my CMA [name] with cough score at followupo - If cough still a problem, will consider CT chest or neck and methacholine challenge test and  Rx with neurontin   04/23/2013 Follow up Cough  6 week follow up - reports cough improves for a few days, then worsens again.Marland Kitchen Cough seems to wax and wane .  Not using anything to control cough .  Has sinus drip and drainage esp at night.  No fever, discolored mucus, chest pain, orthopnea or edema.  CXR 01/2013 with no acute findings.  [name] Cough score 23 today   Begin Delsym 2 tsp Twice daily   Begin Tessalon 200mg  Three times a day   Begin Chlortrimeton 4mg  1 in am and 2 At bedtime  -may  make you sleepy.  Continue on Protonix daily before meal  Continue on Zantac At bedtime   Work on not coughing or throat clearing.  Please contact office for sooner follow up if symptoms do not improve or worsen or seek emergency care  follow up Dr. Marchelle Gearing in 4-6 weeks and As needed  OV 05/21/2013  Chief Complaint  Patient presents with  . Cough    follow-up. Pt states cough is not improved at all.     52yr-old female reports for followup of cough that is chronic. Since seeing me in September 2014 on the cough score her cough appears one third better but subjectively she says that her cough is unimproved. She reports compliance with a sinus treatment measures and acid reflux treatment measures. Despite attending speech therapy and following my advice and the advice of nurse practitioner a month ago cough is unimproved. She is frustrated. RSI cough score is 25 and details are below. She is open to more advanced testing  Dr Gretta Cool Reflux Symptom Index (> 13-15 suggestive of LPR cough) 02/25/2013 Score for past 2 months 02/25/2013 Score past severaldays after changing diet 04/23/13  NP Visit 05/21/2013 S/p sinus, gerd and speech Rx  Hoarseness of problem with voice 3 3  3   Clearing  Of Throat 5 3  3   Excess throat mucus or feeling of post nasal drip 3 0  4  Difficulty swallowing food, liquid or tablets 3 \\0   1  Cough after eating or lying down 3 2  4   Breathing difficulties or choking episodes 1 0  1  Troublesome or annoying cough 5 0  4  Sensation of something sticking in throat or lump in throat 5 3  3   Heartburn, chest pain, indigestion, or stomach acid coming up 5 1  3   TOTAL 33 12 23 25     Past, Family, Social reviewed: no change since last visit    Review of Systems  Constitutional: Negative for fever and unexpected weight change.  HENT: Positive for postnasal drip. Negative for congestion, dental problem, ear pain, nosebleeds, rhinorrhea, sinus pressure, sneezing, sore  throat and trouble swallowing.   Eyes: Negative for redness and itching.  Respiratory: Positive for cough. Negative for chest tightness, shortness of breath and wheezing.   Cardiovascular: Negative for palpitations and leg swelling.  Gastrointestinal: Negative for nausea and vomiting.  Genitourinary: Negative for dysuria.  Musculoskeletal: Negative for joint swelling.  Skin: Negative for rash.  Neurological: Negative for headaches.  Hematological: Does not bruise/bleed easily.  Psychiatric/Behavioral: Negative for dysphoric mood. The patient is not nervous/anxious.    Current outpatient prescriptions:albuterol (PROVENTIL HFA;VENTOLIN HFA) 108 (90 BASE) MCG/ACT inhaler, Inhale 2 puffs into the lungs every 4 (four) hours as needed for wheezing or shortness of breath., Disp: 18 g, Rfl: 5;  aspirin 81 MG EC tablet, Take 81 mg by mouth daily.  , Disp: ,  Rfl: ;  benzonatate (TESSALON) 200 MG capsule, Take 1 capsule (200 mg total) by mouth 3 (three) times daily as needed for cough., Disp: 90 capsule, Rfl: 1 Calcium Carbonate-Vitamin D (CALCIUM 600+D) 600-400 MG-UNIT per tablet, Take 1 tablet by mouth daily.  , Disp: , Rfl: ;  chlorpheniramine (CHLORPHEN) 4 MG tablet, Take 4 mg by mouth 2 (two) times daily. Take 1 tab in AM and 2 tabs in PM, Disp: , Rfl: ;  dextromethorphan (DELSYM) 30 MG/5ML liquid, Take 30 mg by mouth 2 (two) times daily., Disp: , Rfl:  hydrochlorothiazide (HYDRODIURIL) 25 MG tablet, Take 1 tablet (25 mg total) by mouth daily., Disp: 30 tablet, Rfl: 5;  Insulin Lispro Prot & Lispro (HUMALOG MIX 75/25 KWIKPEN) (75-25) 100 UNIT/ML SUPN, Inject 58 Units into the skin 2 (two) times daily., Disp: 15 mL, Rfl: 1;  losartan (COZAAR) 50 MG tablet, Take 1 tablet (50 mg total) by mouth daily., Disp: 30 tablet, Rfl: 6 metFORMIN (GLUCOPHAGE) 1000 MG tablet, Take 1 tablet (1,000 mg total) by mouth 2 (two) times daily with a meal., Disp: 60 tablet, Rfl: 3;  mometasone (NASONEX) 50 MCG/ACT nasal spray,  Place 2 sprays into the nose daily., Disp: 17 g, Rfl: 2;  Multiple Vitamin (MULTIVITAMIN WITH MINERALS) TABS tablet, Take 1 tablet by mouth daily., Disp: , Rfl:  oxyCODONE-acetaminophen (PERCOCET/ROXICET) 5-325 MG per tablet, Take 1 tablet by mouth every 8 (eight) hours as needed., Disp: 90 tablet, Rfl: 0;  pantoprazole (PROTONIX) 40 MG tablet, Take 1 tablet (40 mg total) by mouth 2 (two) times daily., Disp: 60 tablet, Rfl: 3;  pravastatin (PRAVACHOL) 40 MG tablet, Take 1 tablet (40 mg total) by mouth every evening., Disp: 90 tablet, Rfl: 4 ranitidine (ZANTAC) 150 MG tablet, TAKE ONE TABLET BY MOUTH TWICE DAILY, Disp: 60 tablet, Rfl: 3;  traMADol (ULTRAM) 50 MG tablet, Take 50 mg by mouth every 6 (six) hours as needed for pain., Disp: , Rfl: ;  [DISCONTINUED] esomeprazole (NEXIUM) 20 MG capsule, Take 1 capsule (20 mg total) by mouth daily., Disp: 90 capsule, Rfl: 3;  [DISCONTINUED] simvastatin (ZOCOR) 40 MG tablet, TAKE ONE TABLET BY MOUTH EVERY DAY, Disp: 60 tablet, Rfl: 6     Objective:   Physical Exam  Vitals reviewed. Constitutional: She is oriented to person, place, and time. She appears well-developed and well-nourished. No distress.  Body mass index is 42.69 kg/(m^2).   HENT:  Head: Normocephalic and atraumatic.  Right Ear: External ear normal.  Left Ear: External ear normal.  Mouth/Throat: Oropharynx is clear and moist. No oropharyngeal exudate.  Voice laryngeal quality   Eyes: Conjunctivae and EOM are normal. Pupils are equal, round, and reactive to light. Right eye exhibits no discharge. Left eye exhibits no discharge. No scleral icterus.  Neck: Normal range of motion. Neck supple. No JVD present. No tracheal deviation present. No thyromegaly present.  Cardiovascular: Normal rate, regular rhythm, normal heart sounds and intact distal pulses.  Exam reveals no gallop and no friction rub.   No murmur heard. Pulmonary/Chest: Effort normal and breath sounds normal. No respiratory distress.  She has no wheezes. She has no rales. She exhibits no tenderness.  Abdominal: Soft. Bowel sounds are normal. She exhibits no distension and no mass. There is no tenderness. There is no rebound and no guarding.  Musculoskeletal: Normal range of motion. She exhibits no edema and no tenderness.  Lymphadenopathy:    She has no cervical adenopathy.  Neurological: She is alert and oriented to person, place, and  time. She has normal reflexes. No cranial nerve deficit. She exhibits normal muscle tone. Coordination normal.  Skin: Skin is warm and dry. No rash noted. She is not diaphoretic. No erythema. No pallor.  Psychiatric: She has a normal mood and affect. Her behavior is normal. Judgment and thought content normal.          Assessment & Plan:

## 2013-05-24 ENCOUNTER — Ambulatory Visit (INDEPENDENT_AMBULATORY_CARE_PROVIDER_SITE_OTHER)
Admission: RE | Admit: 2013-05-24 | Discharge: 2013-05-24 | Disposition: A | Discharge status: Home / Self Care | Payer: Medicare Other | Source: Ambulatory Visit | Attending: Internal Medicine | Admitting: Internal Medicine

## 2013-05-24 ENCOUNTER — Ambulatory Visit (HOSPITAL_COMMUNITY)
Admission: RE | Admit: 2013-05-24 | Discharge: 2013-05-24 | Disposition: A | Discharge status: Home / Self Care | Payer: Medicare Other | Source: Ambulatory Visit | Attending: Internal Medicine | Admitting: Internal Medicine

## 2013-05-24 ENCOUNTER — Telehealth: Payer: Self-pay | Admitting: Internal Medicine

## 2013-05-24 DIAGNOSIS — R05 Cough: Secondary | ICD-10-CM

## 2013-05-24 DIAGNOSIS — R059 Cough, unspecified: Secondary | ICD-10-CM | POA: Insufficient documentation

## 2013-05-24 LAB — PULMONARY FUNCTION TEST
FEF 25-75 Post: 0.81 L/sec
FEF2575-%Change-Post: 31 %
FEF2575-%Pred-Post: 34 %
FEV1-%Change-Post: 7 %
FEV1-%Pred-Post: 64 %
FEV1-Pre: 1.35 L
FEV1FVC-%Change-Post: 7 %
FEV1FVC-%Pred-Pre: 76 %
FEV6-Post: 2.16 L
FEV6-Pre: 2.14 L
FEV6FVC-%Change-Post: 1 %
FEV6FVC-%Pred-Pre: 101 %
FVC-%Pred-Pre: 76 %
FVC-Post: 2.17 L
Post FEV1/FVC ratio: 67 %
Pre FEV1/FVC ratio: 62 %
Pre FEV6/FVC Ratio: 98 %

## 2013-05-24 MED ORDER — ALBUTEROL SULFATE (5 MG/ML) 0.5% IN NEBU
2.5000 mg | INHALATION_SOLUTION | Freq: Once | RESPIRATORY_TRACT | Status: AC
  Administered 2013-05-24: 2.5 mg via RESPIRATORY_TRACT

## 2013-05-24 NOTE — Telephone Encounter (Signed)
Patient went for methacholine challenge test today December 2014 but the baseline spirometry was abnormal and showed obstructive lung disease without any flow-volume loop abnormalities.  FEV1 is 1.35 L/59%. Ratio 62. Normal flow volume loop. Post bronchodilator FEV1 improved by 7%  She clearly has obstructive lung disease  Please have her see practitioner [name] Parrott week of 05/27/2013 for consideration of oral prednisone and inhaled steroids   Dr. Kalman Shan, M.D., [facility].C.P Pulmonary and Critical Care Medicine Staff Physician [facility]  Pulmonary and Critical Care Pager: [phone], If no answer or between  15:00h - 7:00h: call [phone]  05/24/2013 12:10 PM

## 2013-05-24 NOTE — Progress Notes (Signed)
Pt came in today for Methacholine Challenge Test for chronic cough. During the test pt became slightly SOB and coughing producing moderate amounts of thick clear secretions. FEV1 was 59%. Called ordering MD and was advised do post bronchodilator study.

## 2013-05-27 NOTE — Telephone Encounter (Signed)
Pt advised and  appt set for Friday at 9:45am. Carron Curie, CMA

## 2013-05-29 ENCOUNTER — Telehealth: Payer: Self-pay | Admitting: Internal Medicine

## 2013-05-29 NOTE — Telephone Encounter (Signed)
[name]  You are seeing her 05/31/13 for fu of cough. HEr MCT even baseline showed obstruiction with normal flow volume loop. Post BD was pending when I left on that day 05/24/13. CT I thk shows some nodule and bronchiectasis. CT sinus shows mild sinusitis. FYI  Thanks  Dr. Kalman Shan, M.D., F.C.C.P Pulmonary and Critical Care Medicine Staff Physician [facility] Pulmonary and Critical Care Pager: [phone], If no answer or between  15:00h - 7:00h: call [phone]  05/29/2013 12:41 AM

## 2013-05-31 ENCOUNTER — Encounter: Payer: Self-pay | Admitting: Adult Health

## 2013-05-31 ENCOUNTER — Telehealth: Payer: Self-pay | Admitting: Internal Medicine

## 2013-05-31 ENCOUNTER — Ambulatory Visit (INDEPENDENT_AMBULATORY_CARE_PROVIDER_SITE_OTHER): Payer: Medicare Other | Admitting: Internal Medicine

## 2013-05-31 ENCOUNTER — Encounter: Payer: Self-pay | Admitting: Internal Medicine

## 2013-05-31 ENCOUNTER — Ambulatory Visit (INDEPENDENT_AMBULATORY_CARE_PROVIDER_SITE_OTHER): Payer: Medicare Other | Admitting: Adult Health

## 2013-05-31 VITALS — BP 122/70 | HR 80 | Temp 98.0°F | Ht 64.0 in | Wt 249.0 lb

## 2013-05-31 VITALS — Ht 64.0 in | Wt 250.4 lb

## 2013-05-31 DIAGNOSIS — K219 Gastro-esophageal reflux disease without esophagitis: Secondary | ICD-10-CM

## 2013-05-31 DIAGNOSIS — M549 Dorsalgia, unspecified: Secondary | ICD-10-CM

## 2013-05-31 DIAGNOSIS — E1140 Type 2 diabetes mellitus with diabetic neuropathy, unspecified: Secondary | ICD-10-CM

## 2013-05-31 DIAGNOSIS — J449 Chronic obstructive pulmonary disease, unspecified: Secondary | ICD-10-CM

## 2013-05-31 DIAGNOSIS — R05 Cough: Secondary | ICD-10-CM

## 2013-05-31 DIAGNOSIS — R911 Solitary pulmonary nodule: Secondary | ICD-10-CM | POA: Insufficient documentation

## 2013-05-31 DIAGNOSIS — E119 Type 2 diabetes mellitus without complications: Secondary | ICD-10-CM

## 2013-05-31 DIAGNOSIS — M6530 Trigger finger, unspecified finger: Secondary | ICD-10-CM

## 2013-05-31 DIAGNOSIS — I10 Essential (primary) hypertension: Secondary | ICD-10-CM

## 2013-05-31 DIAGNOSIS — E1142 Type 2 diabetes mellitus with diabetic polyneuropathy: Secondary | ICD-10-CM

## 2013-05-31 DIAGNOSIS — E1149 Type 2 diabetes mellitus with other diabetic neurological complication: Secondary | ICD-10-CM

## 2013-05-31 LAB — POCT GLYCOSYLATED HEMOGLOBIN (HGB A1C): Hemoglobin A1C: 8.7

## 2013-05-31 LAB — GLUCOSE, CAPILLARY: Glucose-Capillary: 230 mg/dL — ABNORMAL HIGH (ref 70–99)

## 2013-05-31 MED ORDER — OXYCODONE-ACETAMINOPHEN 5-325 MG PO TABS: 1.0000 | ORAL_TABLET | Freq: Three times a day (TID) | ORAL | Status: DC | PRN

## 2013-05-31 MED ORDER — FLUTICASONE FUROATE-VILANTEROL 100-25 MCG/INH IN AEPB: 1.0000 | INHALATION_SPRAY | Freq: Every day | RESPIRATORY_TRACT | Status: DC

## 2013-05-31 MED ORDER — TRAMADOL HCL 50 MG PO TABS: 50.0000 mg | ORAL_TABLET | Freq: Four times a day (QID) | ORAL | Status: DC | PRN

## 2013-05-31 NOTE — Assessment & Plan Note (Signed)
BP Readings from Last 3 Encounters:  05/31/13 122/70  05/21/13 140/82  04/23/13 108/62    Lab Results  Component Value Date   NA 139 01/23/2013   K 3.7 01/23/2013   CREATININE 0.55 01/23/2013    Assessment: Blood pressure control: controlled Progress toward BP goal:  at goal Comments:   Plan: Medications:  continue current medications, HCTZ 25 mg daily, losartan 50 mg daily Educational resources provided: brochure;handout;video Self management tools provided: instructions for home blood pressure monitoring Other plans:

## 2013-05-31 NOTE — Progress Notes (Addendum)
Subjective:     Patient ID: [name], female   DOB: March 27, 1961, 52 y.o.   MRN: [id number]  HPI The patient is a 52 YO woman with PMH of HTN, DM II, hyperlipidemia, chronic back pain who presents for routine follow up visit. She has seen the lung doctor about her cough and had recent CT chest and spirometry and diagnosed with COPD. She did not have significant response to albuterol tx. She has not started breo yet but was given a sample today. She had a pulmonary nodule which requires CT chest follow up in 3-6 months. She is still using saline nose spray. She is still having high sugars and is drinking sodas and eating candy with the holidays. She has not been exercising. 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 She is not in school right now.  She has not noticed any hypoglycemic or hyperglycemic episodes. She is not having problems with chest pain or SOB. She is continuing to take her medications as prescribed. No fevers or chills at home.   Review of Systems  Constitutional: Negative for fever, chills, diaphoresis, activity change, appetite change, fatigue and unexpected weight change.  HENT: Negative for congestion, dental problem, drooling, ear discharge, ear pain, facial swelling, hearing loss, nosebleeds, postnasal drip, rhinorrhea, sinus pressure, sneezing, sore throat, tinnitus and trouble swallowing.   Eyes: Negative.   Respiratory: Positive for cough. Negative for choking, chest tightness, shortness of breath, wheezing and stridor.   Cardiovascular: Negative for chest pain, palpitations and leg swelling.  Gastrointestinal: Negative for nausea, vomiting, diarrhea, constipation and abdominal distention.  Musculoskeletal: Positive for arthralgias. Negative for gait problem, joint swelling, neck pain and neck stiffness.  Skin: Negative for color change, pallor, rash and wound.  Neurological: Negative for dizziness, tremors, seizures, syncope, facial asymmetry, speech  difficulty, weakness, light-headedness, numbness and headaches.  Hematological: Negative for adenopathy. Does not bruise/bleed easily.       Objective:   Physical Exam  Constitutional: She is oriented to person, place, and time. She appears well-developed and well-nourished. No distress.  HENT:  Head: Normocephalic and atraumatic.  Mouth/Throat: Oropharynx is clear and moist.  No tonsils and no discharge or erythema at the back of the throat.   Eyes: EOM are normal. Pupils are equal, round, and reactive to light.  Neck: Normal range of motion. Neck supple. No JVD present. No thyromegaly present.  Cardiovascular: Normal rate.   No murmur heard. Pulmonary/Chest: Effort normal and breath sounds normal. No respiratory distress. She has no wheezes. She has no rales. She exhibits no tenderness.  Good air flow heard throughout and no wheezing or crackles auscultated.  Abdominal: Soft. Bowel sounds are normal. She exhibits no distension. There is no tenderness. There is no rebound.  Musculoskeletal: Normal range of motion. She exhibits no edema and no tenderness.  Neurological: She is alert and oriented to person, place, and time. No cranial nerve deficit.  Skin: Skin is warm and dry. No rash noted. She is not diaphoretic. No erythema.       Assessment/Plan:   1. Please see problem oriented charting.  2. Disposition - Humulin 70/30 flexpen 60 units q AM and 60 units q PM. Checked HgA1c and did foot exam. She will continue to follow up pulmonology and will return to [place] in February. BP at goal off cough syrups. Talked to her about diet and exercise and weight loss.

## 2013-05-31 NOTE — Assessment & Plan Note (Signed)
CT chest on December 9 , 2014 that showed mild bronchiectasis in the right middle lobe. No evidence of interstitial lung disease. There was an incidental nodule in the right lung base, measuring 7 mm. >>repeat CT chest in 6 months (hx of smoking )

## 2013-05-31 NOTE — Telephone Encounter (Signed)
Called and spoke with pt and she stated that she is in the donut hole at this time and the breo is going to cost her $187 and she cannot afford this.  Pt stated that she has samples to last till her next appt.  MR please advise. Pt is aware that we will call her back on Monday.   No Known Allergies

## 2013-05-31 NOTE — Assessment & Plan Note (Signed)
Continues to follow with pulmonology and is being treated for COPD, acid reflux. Recent high res CT chest without ILD changes. With a 7 mm nodule to be followed up on CT in 3-6 months. Continues to be manageable with tessalon perles and delsym. She has not started breo yet and is waiting to hear back on a more cost effective solution. I advised her to start sample of breo while she is waiting. Hopefully this will help with her exercise tolerance and she will be able to lose weight.

## 2013-05-31 NOTE — Assessment & Plan Note (Signed)
Lab Results  Component Value Date   HGBA1C 8.7 05/31/2013   HGBA1C 8.3 03/08/2013   HGBA1C 8.2 12/07/2012     Assessment: Diabetes control: fair control Progress toward A1C goal:  deteriorated Comments: Extensively counseled on diet and exercise, referral to nutrition for diet plan. She is drinking regular sodas and sweets during the holiday season. Plan: Medications:  continue current medications, metformin 1000 mg bid and increase humalog 75/25 to 60 units BID Home glucose monitoring: Frequency: 3 times a day Timing: before meals Instruction/counseling given: reminded to bring blood glucose meter & log to each visit, discussed foot care, discussed the need for weight loss and discussed diet Educational resources provided: brochure;handout Self management tools provided: copy of home glucose meter download Other plans:

## 2013-05-31 NOTE — Progress Notes (Signed)
Subjective:    Patient ID: [name], female    DOB: 12-01-1960, 52 y.o.   MRN: [id number]  HPI PCP Genella Mech, MD  HPI  IOV 02/25/2013  52 year old nonsmoker obese female. Accompanied by her husband. Chief complaint is chronic cough.  Cough is of insidious onset a year ago. Was severe in intensity. And was progressive. Quality was a dry cough. There is associated ticklish sensation in her throat and constant clearing and gagging. In April 2014 was diagnosed to have esophageal dysmotility and in July 2014 saw Dr. Arty Baumgartner who adjusted her proton pump inhibitor and change her diet. After this in the last several weeks her cough significantly improved and almost resolved. In between he esophagogram in seeing gastroenterologist Dr. Loreta Ave she did see Dr. Dillard Cannon of ENT specialty who apparently did not see any abnormal with her vocal cords but treated her for sinusitis and this did not help her cough.   Cough relevant history   - Sinus/allergies  - Denies any problems. Never on nasal steroids. But does have some postnasal drip  - GI/reflux disease  - July 2008 had endoscopy which showed mild gastritis. She is on proton pump inhibitor  - Body mass index is 42.99 kg/(m^2).  - esophagogram Aug 2014: IMPRESSION:  Mild esophageal dysmotility.  Otherwise negative esophagram.  Original Report Authenticated By: Charline Bills, M.D.   - SAw DR Loreta Ave July 2014 and PPI adjusted and Diet changed-> after this cough resolved   - Hypertension  -  She has hypertension but she is not on ACE inhibitor  - Pulmonary history  -  reports that she quit smoking about 14 years ago. Her smoking use included Cigarettes. She has a 10 pack-year smoking history. She has never used smokeless tobacco.   - CT chest 2006    -   Findings: No dissection is evident. No filling defect is identified in the pulmonary arterial tree to suggest pulmonary embolus. In the lateral basal segment of the right  lower lobe there is some atelectasis adjacent to a small region of nodularity measuring 6 mm in diameter. This nodularity likely simply relates to atelectasis, but a true pulmonary nodule is difficult to exclude. I would recommend follow up limited noncontrast CT in 3 months time in order to reassess this region.  There is no hilar or mediastinal adenopathy.  IMPRESSION:  No embolus. Mild nodularity associated with right lower lobe subsegmental atx; recommend followup limited evaluation in 3 months in order to ensure that this clears or fails to progress. The main purpose of the followup is to rule out the statistically unlikely possibility that this represents early malignancy.    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  #Sinus  - continue take generic fluticasone inhaler 2 squirts each nostril daily - START 3% nasal saline spray at night 2 squietrs made by a company called [employer]  #ACid reflux  - most important reason for your cough  - folllow advice of Dr Loreta Ave 100% without fail  #Irritable larynx  -2- 3 days of complete voice rest without whispering or talking - See MR CArl Schinke of neuro rehab for speech therapy  #followup  - 4-6 weeks with me or my CMA Bensyn Bornemann with cough score at followupo - If cough still a problem, will consider CT chest or neck and methacholine challenge test and  Rx with neurontin   04/23/2013 Follow up Cough  6 week follow up - reports cough improves for a few days, then worsens again.Marland Kitchen Cough seems to wax and wane .  Not using anything to control cough .  Has sinus drip and drainage esp at night.  No fever, discolored mucus, chest pain, orthopnea or edema.  CXR 01/2013 with no acute findings.  [name] Cough score 23 today   Begin Delsym 2 tsp Twice daily   Begin Tessalon 200mg  Three times a day   Begin Chlortrimeton 4mg  1 in am and 2 At bedtime  -may  make you sleepy.  Continue on Protonix daily before meal  Continue on Zantac At bedtime   Work on not coughing or throat clearing.  Please contact office for sooner follow up if symptoms do not improve or worsen or seek emergency care  follow up Dr. Marchelle Gearing in 4-6 weeks and As needed  OV 05/21/2013 Chief Complaint  Patient presents with  . Cough    follow-up. Pt states cough is not improved at all.   52yr-old female reports for followup of cough that is chronic. Since seeing me in September 2014 on the cough score her cough appears one third better but subjectively she says that her cough is unimproved. She reports compliance with a sinus treatment measures and acid reflux treatment measures. Despite attending speech therapy and following my advice and the advice of nurse practitioner a month ago cough is unimproved. She is frustrated. RSI cough score is 25 and details are below. She is open to more advanced testing  Dr Gretta Cool Reflux Symptom Index (> 13-15 suggestive of LPR cough) 02/25/2013 Score for past 2 months 02/25/2013 Score past severaldays after changing diet 04/23/13  NP Visit 05/21/2013 S/p sinus, gerd and speech Rx  Hoarseness of problem with voice 3 3  3   Clearing  Of Throat 5 3  3   Excess throat mucus or feeling of post nasal drip 3 0  4  Difficulty swallowing food, liquid or tablets 3 \\0   1  Cough after eating or lying down 3 2  4   Breathing difficulties or choking episodes 1 0  1  Troublesome or annoying cough 5 0  4  Sensation of something sticking in throat or lump in throat 5 3  3   Heartburn, chest pain, indigestion, or stomach acid coming up 5 1  3   TOTAL 33 12 23 25     05/31/2013 Follow up  Returns for follow up and review test results . Seen last week with cough . No significant change in cough.  Patient underwent a pulmonary function test on December 12 that showed moderate airflow obstruction with FEV1 is 1.35 L/59%. Ratio 62. No sign change BD  Patient  underwent CT chest on December 9 that showed mild bronchiectasis in the right middle lobe. No evidence of interstitial lung disease. There was an incidental nodule in the right lung base, measuring 7 mm. CT sinus showed no acute sinus disease She denies any hemoptysis, orthopnea, PND, or leg swelling.   Review of Systems  Constitutional:   No  weight loss, night sweats,  Fevers, chills,  +fatigue, or  lassitude.  HEENT:   No headaches,  Difficulty swallowing,  Tooth/dental problems, or  Sore throat,                No sneezing, itching, ear ache,  +nasal congestion, post nasal drip,   CV:  No chest pain,  Orthopnea, PND, swelling in  lower extremities, anasarca, dizziness, palpitations, syncope.   GI  No heartburn, indigestion, abdominal pain, nausea, vomiting, diarrhea, change in bowel habits, loss of appetite, bloody stools.   Resp:  No chest wall deformity  Skin: no rash or lesions.  GU: no dysuria, change in color of urine, no urgency or frequency.  No flank pain, no hematuria   MS:  No joint pain or swelling.  No decreased range of motion.  No back pain.  Psych:  No change in mood or affect. No depression or anxiety.  No memory loss.        Objective:   Physical Exam  GEN: A/Ox3; pleasant , NAD  HEENT:  [place]/AT,  EACs-clear, TMs-wnl, NOSE-clear, THROAT-clear, no lesions, no postnasal drip or exudate noted.   NECK:  Supple w/ fair ROM; no JVD; normal carotid impulses w/o bruits; no thyromegaly or nodules palpated; no lymphadenopathy.  RESP  Clear  P & A; w/o, wheezes/ rales/ or rhonchi.no accessory muscle use, no dullness to percussion  CARD:  RRR, no m/r/g  , no peripheral edema, pulses intact, no cyanosis or clubbing.  GI:   Soft & nt; nml bowel sounds; no organomegaly or masses detected.  Musco: Warm bil, no deformities or joint swelling noted.   Neuro: alert, no focal deficits noted.    Skin: Warm, no lesions or rashes

## 2013-05-31 NOTE — Assessment & Plan Note (Signed)
Weight is still up at 149 pounds. Talked to her about diet and exercise and calories. Referred to nutrition for healthy eating plan. Also given list of exercises that are good calorie burning to help with weight loss goals.

## 2013-05-31 NOTE — Patient Instructions (Addendum)
We will increase your insulin to 60 units with breakfast and 60 units with your evening meal.   Work on increasing your exercise as this will help with your sugars and your weights.   We will send you to a nutrition specialist to help devise a meal plan.  We will send you to orthopedics to talk to them about your hand.   Call us with questions or problems at [phone]. Come back in about 2 months to check on your sugars and adjust your regimen.   Exercise to Lose Weight Exercise and a healthy diet may help you lose weight. Your doctor may suggest specific exercises. EXERCISE IDEAS AND TIPS  Choose low-cost things you enjoy doing, such as walking, bicycling, or exercising to workout videos.  Take stairs instead of the elevator.  Walk during your lunch break.  Park your car further away from work or school.  Go to a gym or an exercise class.  Start with 5 to 10 minutes of exercise each day. Build up to 30 minutes of exercise 4 to 6 days a week.  Wear shoes with good support and comfortable clothes.  Stretch before and after working out.  Work out until you breathe harder and your heart beats faster.  Drink extra water when you exercise.  Do not do so much that you hurt yourself, feel dizzy, or get very short of breath. Exercises that burn about 150 calories:  Running 1  miles in 15 minutes.  Playing volleyball for 45 to 60 minutes.  Washing and waxing a car for 45 to 60 minutes.  Playing touch football for 45 minutes.  Walking 1  miles in 35 minutes.  Pushing a stroller 1  miles in 30 minutes.  Playing basketball for 30 minutes.  Raking leaves for 30 minutes.  Bicycling 5 miles in 30 minutes.  Walking 2 miles in 30 minutes.  Dancing for 30 minutes.  Shoveling snow for 15 minutes.  Swimming laps for 20 minutes.  Walking up stairs for 15 minutes.  Bicycling 4 miles in 15 minutes.  Gardening for 30 to 45 minutes.  Jumping rope for 15  minutes.  Washing windows or floors for 45 to 60 minutes. Document Released: 07/02/2010 Document Revised: 08/22/2011 Document Reviewed: 07/02/2010 [facility] Patient Information 2014 [place], [place].  Basic Carbohydrate Counting Basic carbohydrate counting is a way to plan meals. It is done by counting the amount of carbohydrate in foods. Foods that have carbohydrates are starches (grains, beans, starchy vegetables) and sweets. Eating carbohydrates increases blood glucose (sugar) levels. People with diabetes use carbohydrate counting to help keep their blood glucose at a normal level.  COUNTING CARBOHYDRATES IN FOODS The first step in counting carbohydrates is to learn how many carbohydrate servings you should have in every meal. A dietitian can plan this for you. After learning the amount of carbohydrates to include in your meal plan, you can start to choose the carbohydrate-containing foods you want to eat.  There are 2 ways to identify the amount of carbohydrates in the foods you eat.  Read the Nutrition Facts panel on food labels. You need 2 pieces of information from the Nutrition Facts panel to count carbohydrates this way:  Serving size.  Total carbohydrate (in grams). Decide how many servings you will be eating. If it is 1 serving, you will be eating the amount of carbohydrate listed on the panel. If you will be eating 2 servings, you will be eating double the amount of carbohydrate listed on  the panel.   Learn serving sizes. A serving size of most carbohydrate-containing foods is about 15 grams (g). Listed below are single serving sizes of common carbohydrate-containing foods:  1 slice bread.   cup unsweetened, dry cereal.   cup hot cereal.   cup rice.   cup mashed potatoes.   cup pasta.  1 cup fresh fruit.   cup canned fruit.  1 cup milk (whole, 2%, or skim).   cup starchy vegetables (peas, corn, or potatoes). Counting carbohydrates this way is similar to looking  on the Nutrition Facts panel. Decide how many servings you will eat first. Multiply the number of servings you eat by 15 g. For example, if you have 2 cups of strawberries, you had 2 servings. That means you had 30 g of carbohydrate (2 servings x 15 g = 30 g). CALCULATING CARBOHYDRATES IN A MEAL Sample dinner  3 oz chicken breast.   cup brown rice.   cup corn.  1 cup fat-free milk.  1 cup strawberries with sugar-free whipped topping. Carbohydrate calculation First, identify the foods that contain carbohydrate:  Rice.  Corn.  Milk.  Strawberries. Calculate the number of servings eaten:  2 servings rice.  1 serving corn.  1 serving milk.  1 serving strawberries. Multiply the number of servings by 15 g:  2 servings rice x 15 g = 30 g.  1 serving corn x 15 g = 15 g.  1 serving milk x 15 g = 15 g.  1 serving strawberries x 15 g = 15 g. Add the amounts to find the total carbohydrates eaten: 30 g + 15 g + 15 g + 15 g = 75 g carbohydrate eaten at dinner. Document Released: 05/30/2005 Document Revised: 08/22/2011 Document Reviewed: 04/15/2011 [facility] Patient Information 2014 [place], [place].

## 2013-05-31 NOTE — Assessment & Plan Note (Addendum)
Moderate COPD complicated by upper airway cough  Begin Breo  tx triggers CT chest no ILD noted , mild bronchiectasis   Plan  Trial of Breo Inhaler 1 puff daily , brush/rinse and gargle after use.  Continue on Delsym 2 tsp Twice daily  For cough As needed   Continue on Tessalon 200mg  Three times a day  As needed  Cough .  Continue use Chlortrimeton 4mg  1 in am and 2 At bedtime  -may make you sleepy.  Continue on Protonix daily before meal  Continue on Zantac At bedtime   Work on not coughing or throat clearing- use sips of water, sugarless candy. NO mints .  Please contact office for sooner follow up if symptoms do not improve or worsen or seek emergency care  follow up Dr. Marchelle Gearing in 4-6 weeks and As needed

## 2013-05-31 NOTE — Patient Instructions (Signed)
Trial of Breo Inhaler 1 puff daily , brush/rinse and gargle after use.  Continue on Delsym 2 tsp Twice daily  For cough As needed   Continue on Tessalon 200mg  Three times a day  As needed  Cough .  Continue use Chlortrimeton 4mg  1 in am and 2 At bedtime  -may make you sleepy.  Continue on Protonix daily before meal  Continue on Zantac At bedtime   Work on not coughing or throat clearing- use sips of water, sugarless candy. NO mints .  Please contact office for sooner follow up if symptoms do not improve or worsen or seek emergency care  follow up Dr. Marchelle Gearing in 4-6 weeks and As needed

## 2013-05-31 NOTE — Assessment & Plan Note (Signed)
Continues to take protonix and ranitidine.

## 2013-06-01 ENCOUNTER — Other Ambulatory Visit: Payer: Self-pay | Admitting: Internal Medicine

## 2013-06-03 MED ORDER — FLUTICASONE FUROATE-VILANTEROL 100-25 MCG/INH IN AEPB: 1.0000 | INHALATION_SPRAY | Freq: Every day | RESPIRATORY_TRACT | Status: DC

## 2013-06-03 NOTE — Telephone Encounter (Signed)
Pt aware samples left for pick up. Nothing further needed 

## 2013-06-03 NOTE — Telephone Encounter (Signed)
Yes ok to give samples   Dr. Kalman Shan, M.D., [facility].C.P Pulmonary and Critical Care Medicine Staff Physician [facility] [place] [facility] Pager: [phone], If no answer or between  15:00h - 7:00h: call [phone]  06/03/2013 4:23 PM

## 2013-06-04 ENCOUNTER — Ambulatory Visit (INDEPENDENT_AMBULATORY_CARE_PROVIDER_SITE_OTHER): Payer: Medicare Other | Admitting: Pulmonary Disease

## 2013-06-04 ENCOUNTER — Telehealth: Payer: Self-pay | Admitting: Pulmonary Disease

## 2013-06-04 ENCOUNTER — Encounter: Payer: Self-pay | Admitting: Pulmonary Disease

## 2013-06-04 VITALS — BP 132/86 | HR 90 | Temp 98.4°F | Ht 62.75 in | Wt 250.8 lb

## 2013-06-04 DIAGNOSIS — G4733 Obstructive sleep apnea (adult) (pediatric): Secondary | ICD-10-CM

## 2013-06-04 DIAGNOSIS — R0683 Snoring: Secondary | ICD-10-CM | POA: Insufficient documentation

## 2013-06-04 NOTE — Patient Instructions (Signed)
Please look at the sticker on your cpap machine, and call us with the name of your home care company.  We will then send an order for new supplies, hoses, mask, and to check out your machine Will get your study from [facility] for review. Work on weight loss followup with me in one year if doing well.

## 2013-06-04 NOTE — Assessment & Plan Note (Signed)
The patient tells me that she has a history of sleep apnea, but her sleep study is not available currently. She has been on CPAP compliantly, and is overdue for new supplies. Overall, she feels that she sleeps fairly well, and is satisfied with her daytime alertness. At this point, I need to get a copy of her sleep study, and to find out to her home care company is. We can then order new supplies and a mask for her. I have also encouraged her to work aggressively on weight loss.

## 2013-06-04 NOTE — Progress Notes (Signed)
Case discussed with Dr. Kollar at time of visit. We reviewed the resident's history and exam and pertinent patient test results. I agree with the assessment, diagnosis, and plan of care documented in the resident's note.  

## 2013-06-04 NOTE — Telephone Encounter (Signed)
Order will be placed per [facility] for new supplies, hoses, mask and for her machine to be checked.

## 2013-06-04 NOTE — Progress Notes (Signed)
Subjective:    Patient ID: [name], female    DOB: 1960/08/13, 52 y.o.   MRN: [id number]  HPI The patient is a 52 year old female who comes in today as a self-referral for management of obstructive sleep apnea.  She tells me that she was diagnosed in 2010 with sleep apnea, and has been on CPAP since that time. Unable to tell me where she had her sleep study, but does describe the location. She has no idea who her home care company is, and has not had any maintenance on her device. She currently uses a full face mask and has a heated humidifier. She is wearing her device compliantly, and feels that she sleeps well with it. She feels that she is rested in the mornings upon arising, and denies any inappropriate daytime sleepiness.  She has occasional sleepiness issues in the evenings watching television, but this is not frequent. She denies any sleepiness issues while driving. She states that her weight is stable over the last 2 years, and her Epworth score today is 10   Sleep Questionnaire What time do you typically go to bed?( Between what hours) 10p-12a 10p-12a at 0954 on 06/04/13 by Maisie Fus, CMA How long does it take you to fall asleep? 15-58mins 15-63mins at 0954 on 06/04/13 by Maisie Fus, CMA How many times during the night do you wake up? 3 3 at 0954 on 06/04/13 by Maisie Fus, CMA What time do you get out of bed to start your day? 0730 0730 at 0954 on 06/04/13 by Maisie Fus, CMA Do you drive or operate heavy machinery in your occupation? Yes Yes at 0954 on 06/04/13 by Maisie Fus, CMA How much has your weight changed (up or down) over the past two years? (In pounds) 2 lb (0.907 kg) 2 lb (0.907 kg) at 0954 on 06/04/13 by Maisie Fus, CMA Have you ever had a sleep study before? Yes Yes at 0954 on 06/04/13 by Maisie Fus, CMA If yes, location of study? [facility] at 0954 on 06/04/13 by Maisie Fus, CMA If yes, date of study? 2010 2010 at 0954 on  06/04/13 by Maisie Fus, CMA Do you currently use CPAP? Yes Yes at 0954 on 06/04/13 by Maisie Fus, CMA If so, what pressure? 3 3 at 0954 on 06/04/13 by Maisie Fus, CMA Do you wear oxygen at any time? No No at 0954 on 06/04/13 by Maisie Fus, CMA   Review of Systems  Constitutional: Negative for fever and unexpected weight change.  HENT: Positive for ear pain and sore throat. Negative for congestion, dental problem, nosebleeds, postnasal drip, rhinorrhea, sinus pressure, sneezing and trouble swallowing.   Eyes: Negative for redness and itching.  Respiratory: Positive for cough and shortness of breath. Negative for chest tightness and wheezing.   Cardiovascular: Positive for chest pain. Negative for palpitations and leg swelling.  Gastrointestinal: Negative for nausea and vomiting.       Acid heartburn  Genitourinary: Negative for dysuria.  Musculoskeletal: Positive for arthralgias. Negative for joint swelling.  Skin: Negative for rash.  Neurological: Positive for headaches.  Hematological: Does not bruise/bleed easily.  Psychiatric/Behavioral: Negative for dysphoric mood. The patient is not nervous/anxious.        Objective:   Physical Exam Constitutional:  Obese female, no acute distress  HENT:  Nares patent without discharge, but large turbinates  Oropharynx without exudate, palate and uvula are normal.  Large tongue.  Eyes:  Perrla, eomi, no scleral icterus  Neck:  No JVD, no TMG  Cardiovascular:  Normal rate, regular rhythm, no rubs or gallops.  No murmurs        Intact distal pulses  Pulmonary :  Normal breath sounds, no stridor or respiratory distress   No rales, rhonchi, or wheezing  Abdominal:  Soft, nondistended, bowel sounds present.  No tenderness noted.   Musculoskeletal:  No lower extremity edema noted.  Lymph Nodes:  No cervical lymphadenopathy noted  Skin:  No cyanosis noted  Neurologic:  Alert, appropriate, moves all 4 extremities  without obvious deficit.         Assessment & Plan:

## 2013-06-18 ENCOUNTER — Other Ambulatory Visit: Payer: Self-pay | Admitting: *Deleted

## 2013-06-18 DIAGNOSIS — E1140 Type 2 diabetes mellitus with diabetic neuropathy, unspecified: Secondary | ICD-10-CM

## 2013-06-18 MED ORDER — METFORMIN HCL 1000 MG PO TABS: 1000.0000 mg | ORAL_TABLET | Freq: Two times a day (BID) | ORAL | Status: DC

## 2013-06-25 ENCOUNTER — Ambulatory Visit: Payer: Medicare Other | Admitting: Internal Medicine

## 2013-07-26 ENCOUNTER — Encounter: Payer: Medicare Other | Admitting: Internal Medicine

## 2013-07-26 ENCOUNTER — Ambulatory Visit (INDEPENDENT_AMBULATORY_CARE_PROVIDER_SITE_OTHER): Payer: Medicare Other | Admitting: Internal Medicine

## 2013-07-26 ENCOUNTER — Encounter: Payer: Self-pay | Admitting: Internal Medicine

## 2013-07-26 VITALS — BP 120/70 | HR 90 | Ht 63.5 in | Wt 251.4 lb

## 2013-07-26 DIAGNOSIS — R059 Cough, unspecified: Secondary | ICD-10-CM

## 2013-07-26 DIAGNOSIS — R05 Cough: Secondary | ICD-10-CM

## 2013-07-26 DIAGNOSIS — R053 Chronic cough: Secondary | ICD-10-CM

## 2013-07-26 MED ORDER — GABAPENTIN 300 MG PO CAPS: ORAL_CAPSULE | ORAL | Status: DC

## 2013-07-26 NOTE — Progress Notes (Signed)
Subjective:    Patient ID: [name], female    DOB: Jan 22, 1961, 53 y.o.   MRN: [id number]  HPI  PCP Vertell Novak, MD  HPI  IOV 02/25/2013  53 year old nonsmoker obese female. Accompanied by her husband. Chief complaint is chronic cough.  Cough is of insidious onset a year ago. Was severe in intensity. And was progressive. Quality was a dry cough. There is associated ticklish sensation in her throat and constant clearing and gagging. In April 2014 was diagnosed to have esophageal dysmotility and in July 2014 saw Dr. Verdia Kuba who adjusted her proton pump inhibitor and change her diet. After this in the last several weeks her cough significantly improved and almost resolved. In between he esophagogram in seeing gastroenterologist Dr. Collene Mares she did see Dr. Melony Overly of ENT specialty who apparently did not see any abnormal with her vocal cords but treated her for sinusitis and this did not help her cough.   Cough relevant history   - Sinus/allergies  - Denies any problems. Never on nasal steroids. But does have some postnasal drip  - GI/reflux disease  - July 2008 had endoscopy which showed mild gastritis. She is on proton pump inhibitor  - Body mass index is 42.99 kg/(m^2).  - esophagogram Aug 2014: IMPRESSION:  Mild esophageal dysmotility.  Otherwise negative esophagram.  Original Report Authenticated By: Julian Hy, M.D.   - SAw DR Collene Mares July 2014 and PPI adjusted and Diet changed-> after this cough resolved   - Hypertension  -  She has hypertension but she is not on ACE inhibitor  - Pulmonary history  -  reports that she quit smoking about 14 years ago. Her smoking use included Cigarettes. She has a 10 pack-year smoking history. She has never used smokeless tobacco.   - CT chest 2006    -   Findings: No dissection is evident. No filling defect is identified in the pulmonary arterial tree to suggest pulmonary embolus. In the lateral basal segment of the right  lower lobe there is some atelectasis adjacent to a small region of nodularity measuring 6 mm in diameter. This nodularity likely simply relates to atelectasis, but a true pulmonary nodule is difficult to exclude. I would recommend follow up limited noncontrast CT in 3 months time in order to reassess this region.  There is no hilar or mediastinal adenopathy.  IMPRESSION:  No embolus. Mild nodularity associated with right lower lobe subsegmental atx; recommend followup limited evaluation in 3 months in order to ensure that this clears or fails to progress. The main purpose of the followup is to rule out the statistically unlikely possibility that this represents early malignancy.    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  #Sinus  - continue take generic fluticasone inhaler 2 squirts each nostril daily - START 3% nasal saline spray at night 2 squietrs made by a company called [employer]  #ACid reflux  - most important reason for your cough  - folllow advice of Dr Collene Mares 100% without fail  #Irritable larynx  -2- 3 days of complete voice rest without whispering or talking - See [facility] CArl Schinke of neuro rehab for speech therapy  #followup  - 4-6 weeks with me or my CMA [name] with cough score at followupo - If cough still a problem, will consider CT chest or neck and methacholine challenge test  and Rx with neurontin   04/23/2013 Follow up Cough  6 week follow up - reports cough improves for a few days, then worsens again.Marland Kitchen Cough seems to wax and wane .  Not using anything to control cough .  Has sinus drip and drainage esp at night.  No fever, discolored mucus, chest pain, orthopnea or edema.  CXR 01/2013 with no acute findings.  [name] Cough score 23 today   Begin Delsym 2 tsp Twice daily   Begin Tessalon 200mg  Three times a day   Begin Chlortrimeton 4mg  1 in am and 2 At bedtime  -may  make you sleepy.  Continue on Protonix daily before meal  Continue on Zantac At bedtime   Work on not coughing or throat clearing.  Please contact office for sooner follow up if symptoms do not improve or worsen or seek emergency care  follow up Dr. Chase Caller in 4-6 weeks and As needed  OV 05/21/2013 Chief Complaint  Patient presents with  . Cough    follow-up. Pt states cough is not improved at all.   53yr-old female reports for followup of cough that is chronic. Since seeing me in September 2014 on the cough score her cough appears one third better but subjectively she says that her cough is unimproved. She reports compliance with a sinus treatment measures and acid reflux treatment measures. Despite attending speech therapy and following my advice and the advice of nurse practitioner a month ago cough is unimproved. She is frustrated. RSI cough score is 25 and details are below. She is open to more advanced testing    05/31/2013 Follow up  Returns for follow up and review test results . Seen last week with cough . No significant change in cough.   Patient underwent a pulmonary function test on December 12 that showed moderate airflow obstruction with FEV1 is 1.35 L/59%. Ratio 62. No sign change BD   Patient underwent CT chest on December 9 that showed mild bronchiectasis in the right middle lobe. No evidence of interstitial lung disease. There was an incidental nodule in the right lung base, measuring 7 mm.  CT sinus showed no acute sinus disease  She denies any hemoptysis, orthopnea, PND, or leg swelling.  REC Trial of Breo Inhaler 1 puff daily , brush/rinse and gargle after use.  Continue on Delsym 2 tsp Twice daily  For cough As needed   Continue on Tessalon 200mg  Three times a day  As needed  Cough .  Continue use Chlortrimeton 4mg  1 in am and 2 At bedtime  -may make you sleepy.  Continue on Protonix daily before meal  Continue on Zantac At bedtime   Work on not coughing or  throat clearing- use sips of water, sugarless candy. NO mints .  Please contact office for sooner follow up if symptoms do not improve or worsen or seek emergency care  follow up Dr. Chase Caller in 4-6 weeks and As needed   OV 07/26/2013  Chief Complaint  Patient presents with  . Cough    follow-up. Pt states cough is same as last visti. no better.    Followup chronic cough  She says she is no better. For sinus she's on nasal spray, Flonase, last visit started on Chlor-Trimeton by my nurse practitioner. For acid reflux she is on Protonix and Zantac. 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 RSI cough score is 21. 4 obstructive lung disease she is on Brio but this also  has not helped. She's willing to try Neurontin she's been to speech therapy in the past. Neurontin made her gain weight and one of her chronic pain medications and she stopped it but has not had any adverse effects. I explained to her that Neurontin can be helpful and medications with irritable larynx or chronic cough or cyclical cough but can cause significant side effects of grogginess, fogginess, sleepiness and weight gain. She understands the side effects and is willing to give her short term trial of this  REportes good compliance but I doubt    Dr Lorenza Cambridge Reflux Symptom Index (> 13-15 suggestive of LPR cough) 02/25/2013 Score for past 2 months 02/25/2013 Score past severaldays after changing diet 04/23/13  NP Visit 05/21/2013 S/p sinus, gerd and speech Rx 07/26/2013 After starting breo in additioin to abpove  Hoarseness of problem with voice 3 3  3 2   Clearing  Of Throat 5 3  3 3   Excess throat mucus or feeling of post nasal drip 3 0  4 4  Difficulty swallowing food, liquid or tablets 3 \\0   1 0  Cough after eating or lying down 3 2  4 5   Breathing difficulties or choking episodes 1 0  1 1  Troublesome or annoying cough 5 0  4 4  Sensation of something sticking in throat or lump in  throat 5 3  3 4   Heartburn, chest pain, indigestion, or stomach acid coming up 5 1  3 1   TOTAL 33 12 23 25 21         Review of Systems  Constitutional: Negative for fever and unexpected weight change.  HENT: Negative for congestion, dental problem, ear pain, nosebleeds, postnasal drip, rhinorrhea, sinus pressure, sneezing, sore throat and trouble swallowing.   Eyes: Negative for redness and itching.  Respiratory: Positive for cough. Negative for chest tightness, shortness of breath and wheezing.   Cardiovascular: Negative for palpitations and leg swelling.  Gastrointestinal: Negative for nausea and vomiting.  Genitourinary: Negative for dysuria.  Musculoskeletal: Negative for joint swelling.  Skin: Negative for rash.  Neurological: Negative for headaches.  Hematological: Does not bruise/bleed easily.  Psychiatric/Behavioral: Negative for dysphoric mood. The patient is not nervous/anxious.        Objective:   Physical Exam  Physical Exam  GEN: A/Ox3; pleasant , NAD  HEENT:  [place]/AT,  EACs-clear, TMs-wnl, NOSE-clear, THROAT-clear, no lesions, no postnasal drip or exudate noted.   NECK:  Supple w/ fair ROM; no JVD; normal carotid impulses w/o bruits; no thyromegaly or nodules palpated; no lymphadenopathy.  RESP  Clear  P & A; w/o, wheezes/ rales/ or rhonchi.no accessory muscle use, no dullness to percussion  CARD:  RRR, no m/r/g  , no peripheral edema, pulses intact, no cyanosis or clubbing.  GI:   Soft & nt; nml bowel sounds; no organomegaly or masses detected.  Musco: Warm bil, no deformities or joint swelling noted.   Neuro: alert, no focal deficits noted.    Skin: Warm, no lesions or rashes       Assessment & Plan:

## 2013-07-26 NOTE — Assessment & Plan Note (Signed)
Continue sinus, gerd, and inhaler therpay  - change breo to symbicort 2 puff bid  Take gabapentin 300mg  once daily x 5 days, then 300mg  twice daily x 5 days, then 300mg  three times daily to continue. If this makes you too sleepy or drowsy call us and we will cut your medication dosing down   Return in 4 - 8 weeks   - cough score at followup

## 2013-07-26 NOTE — Patient Instructions (Signed)
Continue sinus, gerd, and inhaler therpay  - change breo to symbicort 2 puff bid  Take gabapentin 300mg once daily x 5 days, then 300mg twice daily x 5 days, then 300mg three times daily to continue. If this makes you too sleepy or drowsy call us and we will cut your medication dosing down   Return in 4 - 8 weeks   - cough score at followup 

## 2013-07-31 ENCOUNTER — Ambulatory Visit: Payer: Medicare Other | Admitting: *Deleted

## 2013-08-05 ENCOUNTER — Other Ambulatory Visit: Payer: Self-pay | Admitting: Internal Medicine

## 2013-08-05 NOTE — Telephone Encounter (Signed)
Needs Feb, MArch, April Appt with PCP

## 2013-08-23 ENCOUNTER — Ambulatory Visit (INDEPENDENT_AMBULATORY_CARE_PROVIDER_SITE_OTHER): Payer: Medicare Other | Admitting: Internal Medicine

## 2013-08-23 ENCOUNTER — Encounter: Payer: Self-pay | Admitting: Internal Medicine

## 2013-08-23 VITALS — BP 122/74 | HR 104 | Temp 97.3°F | Ht 64.0 in | Wt 248.6 lb

## 2013-08-23 DIAGNOSIS — A6000 Herpesviral infection of urogenital system, unspecified: Secondary | ICD-10-CM

## 2013-08-23 DIAGNOSIS — M549 Dorsalgia, unspecified: Secondary | ICD-10-CM

## 2013-08-23 DIAGNOSIS — E1140 Type 2 diabetes mellitus with diabetic neuropathy, unspecified: Secondary | ICD-10-CM

## 2013-08-23 DIAGNOSIS — E1149 Type 2 diabetes mellitus with other diabetic neurological complication: Secondary | ICD-10-CM

## 2013-08-23 DIAGNOSIS — I10 Essential (primary) hypertension: Secondary | ICD-10-CM

## 2013-08-23 DIAGNOSIS — R911 Solitary pulmonary nodule: Secondary | ICD-10-CM

## 2013-08-23 LAB — POCT GLYCOSYLATED HEMOGLOBIN (HGB A1C): Hemoglobin A1C: 9.4

## 2013-08-23 LAB — GLUCOSE, CAPILLARY: GLUCOSE-CAPILLARY: 177 mg/dL — AB (ref 70–99)

## 2013-08-23 MED ORDER — METFORMIN HCL 1000 MG PO TABS: 1000.0000 mg | ORAL_TABLET | Freq: Two times a day (BID) | ORAL | Status: DC

## 2013-08-23 MED ORDER — INSULIN LISPRO PROT & LISPRO (75-25 MIX) 100 UNIT/ML KWIKPEN: 65.0000 [IU] | PEN_INJECTOR | Freq: Two times a day (BID) | SUBCUTANEOUS | Status: DC

## 2013-08-23 MED ORDER — ACYCLOVIR 400 MG PO TABS: 400.0000 mg | ORAL_TABLET | Freq: Two times a day (BID) | ORAL | Status: DC

## 2013-08-23 MED ORDER — OXYCODONE-ACETAMINOPHEN 5-325 MG PO TABS: 1.0000 | ORAL_TABLET | Freq: Three times a day (TID) | ORAL | Status: DC | PRN

## 2013-08-23 NOTE — Progress Notes (Signed)
Subjective:     Patient ID: [name], female   DOB: January 16, 1961, 53 y.o.   MRN: [id number]  HPI The patient is a 53 YO woman with PMH of HTN, DM II, hyperlipidemia, chronic back pain who presents for follow up of her diabetes and weight gain. She has not changed her eating habits and has not been able to exercise. She works on her feet more so feels she is moving around some. She is not in school right now. She has not noticed any hypoglycemic or hyperglycemic episodes. She does not get low or chills. She is trying to eat okay but eats a large meal before bed most nights. She is still following with the lung doctor about her cough and this is improved but not gone. She is not having problems with chest pain or SOB. She is continuing to take her medications as prescribed. No fevers or chills at home.   Review of Systems  Constitutional: Negative for fever, chills, diaphoresis, activity change, appetite change, fatigue and unexpected weight change.  HENT: Negative for congestion, dental problem, drooling, ear discharge, ear pain, facial swelling, hearing loss, nosebleeds, postnasal drip, rhinorrhea, sinus pressure, sneezing, sore throat, tinnitus and trouble swallowing.   Eyes: Negative.   Respiratory: Positive for cough. Negative for choking, chest tightness, shortness of breath, wheezing and stridor.   Cardiovascular: Negative for chest pain, palpitations and leg swelling.  Gastrointestinal: Negative for nausea, vomiting, diarrhea, constipation and abdominal distention.  Musculoskeletal: Positive for arthralgias. Negative for gait problem, joint swelling, neck pain and neck stiffness.  Skin: Negative for color change, pallor, rash and wound.  Neurological: Negative for dizziness, tremors, seizures, syncope, facial asymmetry, speech difficulty, weakness, light-headedness, numbness and headaches.  Hematological: Negative for adenopathy. Does not bruise/bleed easily.       Objective:   Physical  Exam  Constitutional: She is oriented to person, place, and time. She appears well-developed and well-nourished. No distress.  HENT:  Head: Normocephalic and atraumatic.  Mouth/Throat: Oropharynx is clear and moist.  No tonsils and no discharge or erythema at the back of the throat.   Eyes: EOM are normal. Pupils are equal, round, and reactive to light.  Neck: Normal range of motion. Neck supple. No JVD present. No thyromegaly present.  Cardiovascular: Normal rate.   No murmur heard. Pulmonary/Chest: Effort normal and breath sounds normal. No respiratory distress. She has no wheezes. She has no rales. She exhibits no tenderness.  Good air flow heard throughout and no wheezing or crackles auscultated.  Abdominal: Soft. Bowel sounds are normal. She exhibits no distension. There is no tenderness. There is no rebound.  Musculoskeletal: Normal range of motion. She exhibits no edema and no tenderness.  Neurological: She is alert and oriented to person, place, and time. No cranial nerve deficit.  Skin: Skin is warm and dry. No rash noted. She is not diaphoretic. No erythema.       Assessment/Plan:   1. Please see problem oriented charting.  2. Disposition - Humulin 70/30 flexpen 65 units q AM and 65 units q PM. Checked HgA1c which was worsened. Refill on her percocet 5/325 1 q 8 hours prn # 90 no refills given today. She will try to remove the sweets and sodas from her diet. She will work on losing weight through exercise. She declined to meet with the diabetic educator.

## 2013-08-23 NOTE — Patient Instructions (Signed)
We will increase the amount of insulin you get and will increase to 65 units twice a day of your insulin.   Work on increasing exercise and stop eating candy. Stop drinking pop or sodas.   Come back in about 3 months to check on the sugars.   Call us with questions or problems before then at [phone]   Exercise to Lose Weight Exercise and a healthy diet may help you lose weight. Your doctor may suggest specific exercises. EXERCISE IDEAS AND TIPS  Choose low-cost things you enjoy doing, such as walking, bicycling, or exercising to workout videos.  Take stairs instead of the elevator.  Walk during your lunch break.  Park your car further away from work or school.  Go to a gym or an exercise class.  Start with 5 to 10 minutes of exercise each day. Build up to 30 minutes of exercise 4 to 6 days a week.  Wear shoes with good support and comfortable clothes.  Stretch before and after working out.  Work out until you breathe harder and your heart beats faster.  Drink extra water when you exercise.  Do not do so much that you hurt yourself, feel dizzy, or get very short of breath. Exercises that burn about 150 calories:  Running 1  miles in 15 minutes.  Playing volleyball for 45 to 60 minutes.  Washing and waxing a car for 45 to 60 minutes.  Playing touch football for 45 minutes.  Walking 1  miles in 35 minutes.  Pushing a stroller 1  miles in 30 minutes.  Playing basketball for 30 minutes.  Raking leaves for 30 minutes.  Bicycling 5 miles in 30 minutes.  Walking 2 miles in 30 minutes.  Dancing for 30 minutes.  Shoveling snow for 15 minutes.  Swimming laps for 20 minutes.  Walking up stairs for 15 minutes.  Bicycling 4 miles in 15 minutes.  Gardening for 30 to 45 minutes.  Jumping rope for 15 minutes.  Washing windows or floors for 45 to 60 minutes. Document Released: 07/02/2010 Document Revised: 08/22/2011 Document Reviewed: 07/02/2010 [facility]  Patient Information 2014 [place], [place].

## 2013-08-26 ENCOUNTER — Other Ambulatory Visit: Payer: Self-pay | Admitting: Internal Medicine

## 2013-08-26 ENCOUNTER — Telehealth: Payer: Self-pay | Admitting: *Deleted

## 2013-08-26 DIAGNOSIS — E1140 Type 2 diabetes mellitus with diabetic neuropathy, unspecified: Secondary | ICD-10-CM

## 2013-08-26 MED ORDER — INSULIN LISPRO PROT & LISPRO (75-25 MIX) 100 UNIT/ML KWIKPEN: 65.0000 [IU] | PEN_INJECTOR | Freq: Two times a day (BID) | SUBCUTANEOUS | Status: DC

## 2013-08-26 MED ORDER — INSULIN LISPRO PROT & LISPRO (75-25 MIX) 100 UNIT/ML KWIKPEN
65.0000 [IU] | PEN_INJECTOR | Freq: Two times a day (BID) | SUBCUTANEOUS | Status: DC
Start: 2013-08-26 — End: 2013-11-01

## 2013-08-26 NOTE — Assessment & Plan Note (Signed)
Needs follow up before June this year with CT.

## 2013-08-26 NOTE — Assessment & Plan Note (Signed)
BP is well controlled today on HCTZ 25 mg daily and losartan 50 mg daily.

## 2013-08-26 NOTE — Assessment & Plan Note (Signed)
HgA1c is increasing to 9.4 at today's visit and she did indulge in some sweets over the holiday season which got her into worse dietary habits. She was advised that she needs to lose some weight and will increase her insulin to 65 units BID of humalog 75/25. Continue metformin 1000 mg BID. She is on ARB therapy.

## 2013-08-26 NOTE — Telephone Encounter (Signed)
I sent in a new rx at her apt on Friday, was that not received? [facility]. I will resend.

## 2013-08-26 NOTE — Telephone Encounter (Signed)
Call from [facility] - states Humalog Kwikpen 41ml (@65unit ) will only last about 19 days. To prevent double co-pays, 66ml will be a 30 day supply. Please send new rx. Thanks

## 2013-08-29 NOTE — Progress Notes (Signed)
Case discussed with Dr. Kollar at the time of the visit.  We reviewed the resident's history and exam and pertinent patient test results.  I agree with the assessment, diagnosis, and plan of care documented in the resident's note.     

## 2013-09-01 ENCOUNTER — Other Ambulatory Visit: Payer: Self-pay | Admitting: Internal Medicine

## 2013-09-06 ENCOUNTER — Ambulatory Visit (INDEPENDENT_AMBULATORY_CARE_PROVIDER_SITE_OTHER): Payer: Medicare Other | Admitting: Internal Medicine

## 2013-09-06 ENCOUNTER — Encounter: Payer: Medicare Other | Admitting: Internal Medicine

## 2013-09-06 ENCOUNTER — Encounter: Payer: Self-pay | Admitting: Internal Medicine

## 2013-09-06 VITALS — BP 120/74 | HR 84 | Ht 64.0 in | Wt 255.0 lb

## 2013-09-06 DIAGNOSIS — R053 Chronic cough: Secondary | ICD-10-CM

## 2013-09-06 DIAGNOSIS — R05 Cough: Secondary | ICD-10-CM

## 2013-09-06 MED ORDER — GABAPENTIN 300 MG PO CAPS: ORAL_CAPSULE | ORAL | Status: DC

## 2013-09-06 MED ORDER — MOMETASONE FUROATE 50 MCG/ACT NA SUSP: 2.0000 | Freq: Every day | NASAL | Status: DC

## 2013-09-06 NOTE — Patient Instructions (Addendum)
COugh  - not improved - important you follow instructions carefully, closely and 100% of the time   -  Sinus   -  take generic fluticasone inhaler 2 squirts each nostril daily  - continue nasal saline spray - GERD  : continue medications for this  - Airflow obstruction  :  symbicort 2 puff twice daily  - Irritable larynx  : START e gabapentin 300mg  once daily x 5 days, then 300mg  twice daily x 5 days, then 300mg  three times daily to continue. If this makes you too sleepy or drowsy call us and we will cut your medication dosing down   Return in 4 - 8 weeks   - cough score at followup

## 2013-09-06 NOTE — Progress Notes (Signed)
Patient ID: [name], female   DOB: 1961-01-01, 53 y.o.   MRN: [id number]  PCP Vertell Novak, MD  HPI  IOV 02/25/2013  53 year old nonsmoker obese female. Accompanied by her husband. Chief complaint is chronic cough.  Cough is of insidious onset a year ago. Was severe in intensity. And was progressive. Quality was a dry cough. There is associated ticklish sensation in her throat and constant clearing and gagging. In April 2014 was diagnosed to have esophageal dysmotility and in July 2014 saw Dr. Verdia Kuba who adjusted her proton pump inhibitor and change her diet. After this in the last several weeks her cough significantly improved and almost resolved. In between he esophagogram in seeing gastroenterologist Dr. Collene Mares she did see Dr. Melony Overly of ENT specialty who apparently did not see any abnormal with her vocal cords but treated her for sinusitis and this did not help her cough.   Cough relevant history   - Sinus/allergies  - Denies any problems. Never on nasal steroids. But does have some postnasal drip  - GI/reflux disease  - July 2008 had endoscopy which showed mild gastritis. She is on proton pump inhibitor  - Body mass index is 42.99 kg/(m^2).  - esophagogram Aug 2014: IMPRESSION:  Mild esophageal dysmotility.  Otherwise negative esophagram.  Original Report Authenticated By: Julian Hy, M.D.   - SAw DR Collene Mares July 2014 and PPI adjusted and Diet changed-> after this cough resolved   - Hypertension  -  She has hypertension but she is not on ACE inhibitor  - Pulmonary history  -  reports that she quit smoking about 14 years ago. Her smoking use included Cigarettes. She has a 10 pack-year smoking history. She has never used smokeless tobacco.   - CT chest 2006    -   Findings: No dissection is evident. No filling defect is identified in the pulmonary arterial tree to suggest pulmonary embolus. In the lateral basal segment of the right lower lobe there is some  atelectasis adjacent to a small region of nodularity measuring 6 mm in diameter. This nodularity likely simply relates to atelectasis, but a true pulmonary nodule is difficult to exclude. I would recommend follow up limited noncontrast CT in 3 months time in order to reassess this region.  There is no hilar or mediastinal adenopathy.  IMPRESSION:  No embolus. Mild nodularity associated with right lower lobe subsegmental atx; recommend followup limited evaluation in 3 months in order to ensure that this clears or fails to progress. The main purpose of the followup is to rule out the statistically unlikely possibility that this represents early malignancy.    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  #Sinus  - continue take generic fluticasone inhaler 2 squirts each nostril daily - START 3% nasal saline spray at night 2 squietrs made by a company called [employer]  #ACid reflux  - most important reason for your cough  - folllow advice of Dr Collene Mares 100% without fail  #Irritable larynx  -2- 3 days of complete voice rest without whispering or talking - See MR CArl Schinke of neuro rehab for speech therapy  #followup  - 4-6 weeks with me or my CMA [name] with cough score at followupo - If cough still a problem, will consider CT chest or neck and methacholine challenge test and Rx with neurontin   04/23/2013 Follow  up Cough  6 week follow up - reports cough improves for a few days, then worsens again.Marland Kitchen Cough seems to wax and wane .  Not using anything to control cough .  Has sinus drip and drainage esp at night.  No fever, discolored mucus, chest pain, orthopnea or edema.  CXR 01/2013 with no acute findings.  [name] Cough score 23 today   Begin Delsym 2 tsp Twice daily   Begin Tessalon 200mg  Three times a day   Begin Chlortrimeton 4mg  1 in am and 2 At bedtime  -may make you sleepy.   Continue on Protonix daily before meal  Continue on Zantac At bedtime   Work on not coughing or throat clearing.  Please contact office for sooner follow up if symptoms do not improve or worsen or seek emergency care  follow up Dr. Chase Caller in 4-6 weeks and As needed  OV 05/21/2013 Chief Complaint  Patient presents with  . Cough    follow-up. Pt states cough is not improved at all.   53yr-old female reports for followup of cough that is chronic. Since seeing me in September 2014 on the cough score her cough appears one third better but subjectively she says that her cough is unimproved. She reports compliance with a sinus treatment measures and acid reflux treatment measures. Despite attending speech therapy and following my advice and the advice of nurse practitioner a month ago cough is unimproved. She is frustrated. RSI cough score is 25 and details are below. She is open to more advanced testing    05/31/2013 Follow up  Returns for follow up and review test results . Seen last week with cough . No significant change in cough.   Patient underwent a pulmonary function test on December 12 that showed moderate airflow obstruction with FEV1 is 1.35 L/59%. Ratio 62. No sign change BD   Patient underwent CT chest on December 9 that showed mild bronchiectasis in the right middle lobe. No evidence of interstitial lung disease. There was an incidental nodule in the right lung base, measuring 7 mm.  CT sinus showed no acute sinus disease  She denies any hemoptysis, orthopnea, PND, or leg swelling.  REC Trial of Breo Inhaler 1 puff daily , brush/rinse and gargle after use.  Continue on Delsym 2 tsp Twice daily  For cough As needed   Continue on Tessalon 200mg  Three times a day  As needed  Cough .  Continue use Chlortrimeton 4mg  1 in am and 2 At bedtime  -may make you sleepy.  Continue on Protonix daily before meal  Continue on Zantac At bedtime   Work on not coughing or throat clearing-  use sips of water, sugarless candy. NO mints .  Please contact office for sooner follow up if symptoms do not improve or worsen or seek emergency care  follow up Dr. Chase Caller in 4-6 weeks and As needed   OV 07/26/2013  Chief Complaint  Patient presents with  . Cough    follow-up. Pt states cough is same as last visti. no better.    Followup chronic cough  She says she is no better. For sinus she's on nasal spray, Flonase, last visit started on Chlor-Trimeton by my nurse practitioner. For acid reflux she is on Protonix and Zantac. 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 RSI cough score is 21. 4 obstructive lung disease she is on Brio but this also has not helped. She's willing to try Neurontin  she's been to speech therapy in the past. Neurontin made her gain weight and one of her chronic pain medications and she stopped it but has not had any adverse effects. I explained to her that Neurontin can be helpful and medications with irritable larynx or chronic cough or cyclical cough but can cause significant side effects of grogginess, fogginess, sleepiness and weight gain. She understands the side effects and is willing to give her short term trial of this  REportes good compliance but I doubt   REC Continue sinus, gerd, and inhaler therpay  - change breo to symbicort 2 puff bid  Take gabapentin 300mg  once daily x 5 days, then 300mg  twice daily x 5 days, then 300mg  three times daily to continue. If this makes you too sleepy or drowsy call us and we will cut your medication dosing down   Return in 4 - 8 weeks   - cough score at followup   OV 09/06/2013  Chief Complaint  Patient presents with  . Cough    Reports cough has improved since last OV--Has days it will try and come back     FU chronic cough:    She tells me she is "some" better but in reality the RSI cough score shows persistence of severe chronic cough. In terms of sinus control: She is  only doing her nasal saline drops as needed. She's not doing nasal steroids and she is unclear why. In terms of obstructive lung disease: She says that she is compliant with his Symbicort although she did not know the name initially. In terms of acid reflux: She says she's compliant with the proton pump and better. In terms of irritable larynx: I advised her to start gabapentin at last visit but she does be that she showed up at the pharmacy and the pharmacist told her that in his years of experience he never saw gabapentin being prescribed for chronic cough so she decided not to treat herself with gabapentin. I asked her why she can call here and she had no answer.  I've advised her 100% compliance with treatment measures if she needs to improve. Because of her on the side effects of gabapentin extensively and after listening to this and the potential benefits based on lancet paper she is willing to give this a try  Dr Lorenza Cambridge Reflux Symptom Index (> 13-15 suggestive of LPR cough) 02/25/2013 Score for past 2 months 02/25/2013 Score past severaldays after changing diet 04/23/13  NP Visit 05/21/2013 S/p sinus, gerd and speech Rx 07/26/2013 After starting breo in additioin to abpove 09/06/2013 ssaline sinus, no nasal steroid, symbiRx, ppi+ but no neurontin  Hoarseness of problem with voice 3 3  3 2 2   Clearing  Of Throat 5 3  3 3 4   Excess throat mucus or feeling of post nasal drip 3 0  4 4 5   Difficulty swallowing food, liquid or tablets 3 \\0   1 0 0  Cough after eating or lying down 3 2  4 5 5   Breathing difficulties or choking episodes 1 0  1 1 0  Troublesome or annoying cough 5 0  4 4 5   Sensation of something sticking in throat or lump in throat 5 3  3 4  4.5  Heartburn, chest pain, indigestion, or stomach acid coming up 5 1  3 1 5   TOTAL 33 12 23 25 21  30.5        Review of Systems  Constitutional: Negative for fever and unexpected weight  change.  HENT: Negative for congestion, dental  problem, ear pain, nosebleeds, postnasal drip, rhinorrhea, sinus pressure, sneezing, sore throat and trouble swallowing.   Eyes: Negative for redness and itching.  Respiratory: Positive for cough. Negative for chest tightness, shortness of breath and wheezing.   Cardiovascular: Negative for palpitations and leg swelling.  Gastrointestinal: Negative for nausea and vomiting.  Genitourinary: Negative for dysuria.  Musculoskeletal: Negative for joint swelling.  Skin: Negative for rash.  Neurological: Negative for headaches.  Hematological: Does not bruise/bleed easily.  Psychiatric/Behavioral: Negative for dysphoric mood. The patient is not nervous/anxious.     Past, Family, Social reviewed:  Rt arm pain x few weeks. Told her to discuss with PCP Vertell Novak, MD    Current outpatient prescriptions:acyclovir (ZOVIRAX) 400 MG tablet, Take 1 tablet (400 mg total) by mouth 2 (two) times daily., Disp: 60 tablet, Rfl: 3;  albuterol (PROVENTIL HFA;VENTOLIN HFA) 108 (90 BASE) MCG/ACT inhaler, Inhale 2 puffs into the lungs every 4 (four) hours as needed for wheezing or shortness of breath., Disp: 18 g, Rfl: 5;  aspirin 81 MG EC tablet, Take 81 mg by mouth daily.  , Disp: , Rfl:  Calcium Carbonate-Vitamin D (CALCIUM 600+D) 600-400 MG-UNIT per tablet, Take 1 tablet by mouth daily.  , Disp: , Rfl: ;  chlorpheniramine (CHLORPHEN) 4 MG tablet, Take 4 mg by mouth 2 (two) times daily. Take 1 tab in AM and 2 tabs in PM, Disp: , Rfl: ;  dextromethorphan (DELSYM) 30 MG/5ML liquid, Take 30 mg by mouth 2 (two) times daily., Disp: , Rfl:  Fluticasone Furoate-Vilanterol (BREO ELLIPTA) 100-25 MCG/INH AEPB, Inhale 1 Inhaler into the lungs daily., Disp: 3 each, Rfl: 0;  hydrochlorothiazide (HYDRODIURIL) 25 MG tablet, TAKE ONE TABLET BY MOUTH ONCE DAILY, Disp: 30 tablet, Rfl: 6;  Insulin Lispro Prot & Lispro (HUMALOG MIX 75/25 KWIKPEN) (75-25) 100 UNIT/ML Kwikpen, Inject 65 Units into the skin 2 (two) times daily., Disp:  40 mL, Rfl: 6 losartan (COZAAR) 50 MG tablet, Take 1 tablet (50 mg total) by mouth daily., Disp: 30 tablet, Rfl: 6;  metFORMIN (GLUCOPHAGE) 1000 MG tablet, Take 1 tablet (1,000 mg total) by mouth 2 (two) times daily with a meal., Disp: 60 tablet, Rfl: 6;  mometasone (NASONEX) 50 MCG/ACT nasal spray, Place 2 sprays into the nose daily., Disp: 17 g, Rfl: 2;  Multiple Vitamin (MULTIVITAMIN WITH MINERALS) TABS tablet, Take 1 tablet by mouth daily., Disp: , Rfl:  oxyCODONE-acetaminophen (PERCOCET/ROXICET) 5-325 MG per tablet, Take 1 tablet by mouth every 8 (eight) hours as needed., Disp: 90 tablet, Rfl: 0;  pantoprazole (PROTONIX) 40 MG tablet, Take 1 tablet (40 mg total) by mouth 2 (two) times daily., Disp: 60 tablet, Rfl: 3;  pravastatin (PRAVACHOL) 40 MG tablet, Take 1 tablet (40 mg total) by mouth every evening., Disp: 90 tablet, Rfl: 4 gabapentin (NEURONTIN) 300 MG capsule, Take one tablet daily x 5 days, then 1 tablet twice daily x 5 days, then 1 tablet three times daily and continue., Disp: 100 capsule, Rfl: 2;  [DISCONTINUED] esomeprazole (NEXIUM) 20 MG capsule, Take 1 capsule (20 mg total) by mouth daily., Disp: 90 capsule, Rfl: 3;  [DISCONTINUED] simvastatin (ZOCOR) 40 MG tablet, TAKE ONE TABLET BY MOUTH EVERY DAY, Disp: 60 tablet, Rfl: 6     Objective:   Physical Exam Filed Vitals:   09/06/13 1631  BP: 120/74  Pulse: 84  Height: 5\' 4"  (1.626 m)  Weight: 115.667 kg (255 lb)  SpO2: 97%     Physical Exam  GEN: A/Ox3; pleasant , NAD  HEENT:  [place]/AT,  EACs-clear, TMs-wnl, NOSE-clear, THROAT-clear, no lesions, no postnasal drip or exudate noted.   NECK:  Supple w/ fair ROM; no JVD; normal carotid impulses w/o bruits; no thyromegaly or nodules palpated; no lymphadenopathy.  RESP  Clear  P & A; w/o, wheezes/ rales/ or rhonchi.no accessory muscle use, no dullness to percussion  CARD:  RRR, no m/r/g  , no peripheral edema, pulses intact, no cyanosis or clubbing.  GI:   Soft & nt; nml bowel  sounds; no organomegaly or masses detected.  Musco: Warm bil, no deformities or joint swelling noted.   Neuro: alert, no focal deficits noted.    Skin: Warm, no lesions or rashes       Assessment & Plan:

## 2013-09-06 NOTE — Progress Notes (Signed)
 Subjective:    Patient ID: [name], female    DOB: 09/28/1960, 53 y.o.   MRN: [id number]  HPI  PCP KOLLAR, ELIZABETH, MD  HPI  IOV 02/25/2013  53-year-old nonsmoker obese female. Accompanied by her husband. Chief complaint is chronic cough.  Cough is of insidious onset a year ago. Was severe in intensity. And was progressive. Quality was a dry cough. There is associated ticklish sensation in her throat and constant clearing and gagging. In April 2014 was diagnosed to have esophageal dysmotility and in July 2014 saw Dr. J mANN who adjusted her proton pump inhibitor and change her diet. After this in the last several weeks her cough significantly improved and almost resolved. In between he esophagogram in seeing gastroenterologist Dr. mANN she did see Dr. Christopher Newman of ENT specialty who apparently did not see any abnormal with her vocal cords but treated her for sinusitis and this did not help her cough.   Cough relevant history   - Sinus/allergies  - Denies any problems. Never on nasal steroids. But does have some postnasal drip  - GI/reflux disease  - July 2008 had endoscopy which showed mild gastritis. She is on proton pump inhibitor  - Body mass index is 42.99 kg/(m^2).  - esophagogram Aug 2014: IMPRESSION:  Mild esophageal dysmotility.  Otherwise negative esophagram.  Original Report Authenticated By: Sriyesh Krishnan, M.D.   - SAw DR Mann July 2014 and PPI adjusted and Diet changed-> after this cough resolved   - Hypertension  -  She has hypertension but she is not on ACE inhibitor  - Pulmonary history  -  reports that she quit smoking about 14 years ago. Her smoking use included Cigarettes. She has a 10 pack-year smoking history. She has never used smokeless tobacco.   - CT chest 2006    -   Findings: No dissection is evident. No filling defect is identified in the pulmonary arterial tree to suggest pulmonary embolus. In the lateral basal segment of the right  lower lobe there is some atelectasis adjacent to a small region of nodularity measuring 6 mm in diameter. This nodularity likely simply relates to atelectasis, but a true pulmonary nodule is difficult to exclude. I would recommend follow up limited noncontrast CT in 3 months time in order to reassess this region.  There is no hilar or mediastinal adenopathy.  IMPRESSION:  No embolus. Mild nodularity associated with right lower lobe subsegmental atx; recommend followup limited evaluation in 3 months in order to ensure that this clears or fails to progress. The main purpose of the followup is to rule out the statistically unlikely possibility that this represents early malignancy.    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  #Sinus  - continue take generic fluticasone inhaler 2 squirts each nostril daily - START 3% nasal saline spray at night 2 squietrs made by a company called [employer]  #ACid reflux  - most important reason for your cough  - folllow advice of Dr Mann 100% without fail  #Irritable larynx  -2- 3 days of complete voice rest without whispering or talking - See MR CArl Schinke of neuro rehab for speech therapy  #followup  - 4-6 weeks with me or my CMA [name] with cough score at followupo - If cough still a problem, will consider CT chest or neck and methacholine challenge test   and Rx with neurontin   04/23/2013 Follow up Cough  6 week follow up - reports cough improves for a few days, then worsens again.Marland Kitchen Cough seems to wax and wane .  Not using anything to control cough .  Has sinus drip and drainage esp at night.  No fever, discolored mucus, chest pain, orthopnea or edema.  CXR 01/2013 with no acute findings.  [name] Cough score 23 today   Begin Delsym 2 tsp Twice daily   Begin Tessalon 200mg  Three times a day   Begin Chlortrimeton 4mg  1 in am and 2 At bedtime  -may  make you sleepy.  Continue on Protonix daily before meal  Continue on Zantac At bedtime   Work on not coughing or throat clearing.  Please contact office for sooner follow up if symptoms do not improve or worsen or seek emergency care  follow up Dr. Chase Caller in 4-6 weeks and As needed  OV 05/21/2013 Chief Complaint  Patient presents with  . Cough    follow-up. Pt states cough is not improved at all.   53yr-old female reports for followup of cough that is chronic. Since seeing me in September 2014 on the cough score her cough appears one third better but subjectively she says that her cough is unimproved. She reports compliance with a sinus treatment measures and acid reflux treatment measures. Despite attending speech therapy and following my advice and the advice of nurse practitioner a month ago cough is unimproved. She is frustrated. RSI cough score is 25 and details are below. She is open to more advanced testing    05/31/2013 Follow up  Returns for follow up and review test results . Seen last week with cough . No significant change in cough.   Patient underwent a pulmonary function test on December 12 that showed moderate airflow obstruction with FEV1 is 1.35 L/53%. Ratio 62. No sign change BD   Patient underwent CT chest on December 9 that showed mild bronchiectasis in the right middle lobe. No evidence of interstitial lung disease. There was an incidental nodule in the right lung base, measuring 7 mm.  CT sinus showed no acute sinus disease  She denies any hemoptysis, orthopnea, PND, or leg swelling.  REC Trial of Breo Inhaler 1 puff daily , brush/rinse and gargle after use.  Continue on Delsym 2 tsp Twice daily  For cough As needed   Continue on Tessalon 200mg  Three times a day  As needed  Cough .  Continue use Chlortrimeton 4mg  1 in am and 2 At bedtime  -may make you sleepy.  Continue on Protonix daily before meal  Continue on Zantac At bedtime   Work on not coughing or  throat clearing- use sips of water, sugarless candy. NO mints .  Please contact office for sooner follow up if symptoms do not improve or worsen or seek emergency care  follow up Dr. Chase Caller in 4-6 weeks and As needed   OV 07/26/2013  Chief Complaint  Patient presents with  . Cough    follow-up. Pt states cough is same as last visti. no better.    Followup chronic cough  She says she is no better. For sinus she's on nasal spray, Flonase, last visit started on Chlor-Trimeton by my nurse practitioner. For acid reflux she is on Protonix and Zantac. 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 RSI cough score is 21. 4 obstructive lung disease she is on Brio but this also  has not helped. She's willing to try Neurontin she's been to speech therapy in the past. Neurontin made her gain weight and one of her chronic pain medications and she stopped it but has not had any adverse effects. I explained to her that Neurontin can be helpful and medications with irritable larynx or chronic cough or cyclical cough but can cause significant side effects of grogginess, fogginess, sleepiness and weight gain. She understands the side effects and is willing to give her short term trial of this  REportes good compliance but I doubt    Dr Lorenza Cambridge Reflux Symptom Index (> 13-15 suggestive of LPR cough) 02/25/2013 Score for past 2 months 02/25/2013 Score past severaldays after changing diet 04/23/13  NP Visit 05/21/2013 S/p sinus, gerd and speech Rx 07/26/2013 After starting breo in additioin to abpove  Hoarseness of problem with voice 3 3  3 2   Clearing  Of Throat 5 3  3 3   Excess throat mucus or feeling of post nasal drip 3 0  4 4  Difficulty swallowing food, liquid or tablets 3 \\0   1 0  Cough after eating or lying down 3 2  4 5   Breathing difficulties or choking episodes 1 0  1 1  Troublesome or annoying cough 5 0  4 4  Sensation of something sticking in throat or lump in  throat 5 3  3 4   Heartburn, chest pain, indigestion, or stomach acid coming up 5 1  3 1   TOTAL 33 12 23 25 21      Review of Systems  Constitutional: Negative for fever and unexpected weight change.  HENT: Negative for congestion, dental problem, ear pain, nosebleeds, postnasal drip, rhinorrhea, sinus pressure, sneezing, sore throat and trouble swallowing.   Eyes: Negative for redness and itching.  Respiratory: Positive for cough. Negative for chest tightness, shortness of breath and wheezing.   Cardiovascular: Negative for palpitations and leg swelling.  Gastrointestinal: Negative for nausea and vomiting.  Genitourinary: Negative for dysuria.  Musculoskeletal: Negative for joint swelling.  Skin: Negative for rash.  Neurological: Negative for headaches.  Hematological: Does not bruise/bleed easily.  Psychiatric/Behavioral: Negative for dysphoric mood. The patient is not nervous/anxious.        Objective:   Physical Exam  Physical Exam  GEN: A/Ox3; pleasant , NAD  HEENT:  [place]/AT,  EACs-clear, TMs-wnl, NOSE-clear, THROAT-clear, no lesions, no postnasal drip or exudate noted.   NECK:  Supple w/ fair ROM; no JVD; normal carotid impulses w/o bruits; no thyromegaly or nodules palpated; no lymphadenopathy.  RESP  Clear  P & A; w/o, wheezes/ rales/ or rhonchi.no accessory muscle use, no dullness to percussion  CARD:  RRR, no m/r/g  , no peripheral edema, pulses intact, no cyanosis or clubbing.  GI:   Soft & nt; nml bowel sounds; no organomegaly or masses detected.  Musco: Warm bil, no deformities or joint swelling noted.   Neuro: alert, no focal deficits noted.    Skin: Warm, no lesions or rashes       Assessment & Plan:

## 2013-09-09 ENCOUNTER — Ambulatory Visit: Payer: Medicare Other | Admitting: *Deleted

## 2013-09-10 NOTE — Assessment & Plan Note (Signed)
COugh  - not improved - important you follow instructions carefully, closely and 100% of the time   -  Sinus   -  take generic fluticasone inhaler 2 squirts each nostril daily  - continue nasal saline spray - GERD  : continue medications for this  - Airflow obstruction  :  symbicort 2 puff twice daily  - Irritable larynx  : START e gabapentin 300mg once daily x 5 days, then 300mg twice daily x 5 days, then 300mg three times daily to continue. If this makes you too sleepy or drowsy call us and we will cut your medication dosing down   Return in 4 - 8 weeks   - cough score at followup 

## 2013-09-17 ENCOUNTER — Other Ambulatory Visit: Payer: Self-pay

## 2013-09-17 DIAGNOSIS — Z1231 Encounter for screening mammogram for malignant neoplasm of breast: Secondary | ICD-10-CM

## 2013-09-30 ENCOUNTER — Other Ambulatory Visit: Payer: Self-pay | Admitting: *Deleted

## 2013-10-01 MED ORDER — PANTOPRAZOLE SODIUM 40 MG PO TBEC: 40.0000 mg | DELAYED_RELEASE_TABLET | Freq: Two times a day (BID) | ORAL | Status: DC

## 2013-10-03 ENCOUNTER — Ambulatory Visit
Admission: RE | Admit: 2013-10-03 | Discharge: 2013-10-03 | Disposition: A | Discharge status: Home / Self Care | Payer: Medicare Other | Source: Ambulatory Visit

## 2013-10-03 DIAGNOSIS — Z1231 Encounter for screening mammogram for malignant neoplasm of breast: Secondary | ICD-10-CM

## 2013-10-15 ENCOUNTER — Ambulatory Visit: Payer: Medicare Other | Admitting: Internal Medicine

## 2013-10-16 ENCOUNTER — Ambulatory Visit (INDEPENDENT_AMBULATORY_CARE_PROVIDER_SITE_OTHER): Payer: Medicare Other | Admitting: Internal Medicine

## 2013-10-16 ENCOUNTER — Encounter: Payer: Self-pay | Admitting: Internal Medicine

## 2013-10-16 VITALS — BP 106/62 | HR 93 | Temp 98.3°F | Ht 64.0 in | Wt 254.4 lb

## 2013-10-16 DIAGNOSIS — R053 Chronic cough: Secondary | ICD-10-CM

## 2013-10-16 DIAGNOSIS — R05 Cough: Secondary | ICD-10-CM

## 2013-10-16 DIAGNOSIS — R911 Solitary pulmonary nodule: Secondary | ICD-10-CM

## 2013-10-16 NOTE — Progress Notes (Signed)
Subjective:     Patient ID: [name], female   DOB: 05-18-1961, 53 y.o.   MRN: [id number]  HPI  IOV 02/25/2013  53 year old nonsmoker obese female. Accompanied by her husband. Chief complaint is chronic cough.  Cough is of insidious onset a year ago. Was severe in intensity. And was progressive. Quality was a dry cough. There is associated ticklish sensation in her throat and constant clearing and gagging. In April 2014 was diagnosed to have esophageal dysmotility and in July 2014 saw Dr. Verdia Kuba who adjusted her proton pump inhibitor and change her diet. After this in the last several weeks her cough significantly improved and almost resolved. In between he esophagogram in seeing gastroenterologist Dr. Collene Mares she did see Dr. Melony Overly of ENT specialty who apparently did not see any abnormal with her vocal cords but treated her for sinusitis and this did not help her cough.   Cough relevant history   - Sinus/allergies  - Denies any problems. Never on nasal steroids. But does have some postnasal drip  - GI/reflux disease  - July 2008 had endoscopy which showed mild gastritis. She is on proton pump inhibitor  - Body mass index is 42.99 kg/(m^2).  - esophagogram Aug 2014: IMPRESSION:  Mild esophageal dysmotility.  Otherwise negative esophagram.  Original Report Authenticated By: Julian Hy, M.D.   - SAw DR Collene Mares July 2014 and PPI adjusted and Diet changed-> after this cough resolved   - Hypertension  -  She has hypertension but she is not on ACE inhibitor  - Pulmonary history  -  reports that she quit smoking about 14 years ago. Her smoking use included Cigarettes. She has a 10 pack-year smoking history. She has never used smokeless tobacco.   - CT chest 2006    -   Findings: No dissection is evident. No filling defect is identified in the pulmonary arterial tree to suggest pulmonary embolus. In the lateral basal segment of the right lower lobe there is some atelectasis  adjacent to a small region of nodularity measuring 6 mm in diameter. This nodularity likely simply relates to atelectasis, but a true pulmonary nodule is difficult to exclude. I would recommend follow up limited noncontrast CT in 3 months time in order to reassess this region.  There is no hilar or mediastinal adenopathy.  IMPRESSION:  No embolus. Mild nodularity associated with right lower lobe subsegmental atx; recommend followup limited evaluation in 3 months in order to ensure that this clears or fails to progress. The main purpose of the followup is to rule out the statistically unlikely possibility that this represents early malignancy.    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  #Sinus  - continue take generic fluticasone inhaler 2 squirts each nostril daily - START 3% nasal saline spray at night 2 squietrs made by a company called [employer]  #ACid reflux  - most important reason for your cough  - folllow advice of Dr Collene Mares 100% without fail  #Irritable larynx  -2- 3 days of complete voice rest without whispering or talking - See [facility] CArl Schinke of neuro rehab for speech therapy  #followup  - 4-6 weeks with me or my CMA [name] with cough score at followupo - If cough still a problem, will consider CT chest or neck and methacholine challenge test and Rx with neurontin   04/23/2013 Follow  up Cough  6 week follow up - reports cough improves for a few days, then worsens again.Marland Kitchen Cough seems to wax and wane .  Not using anything to control cough .  Has sinus drip and drainage esp at night.  No fever, discolored mucus, chest pain, orthopnea or edema.  CXR 01/2013 with no acute findings.  [name] Cough score 23 today   Begin Delsym 2 tsp Twice daily   Begin Tessalon 200mg  Three times a day   Begin Chlortrimeton 4mg  1 in am and 2 At bedtime  -may make you sleepy.  Continue on  Protonix daily before meal  Continue on Zantac At bedtime   Work on not coughing or throat clearing.  Please contact office for sooner follow up if symptoms do not improve or worsen or seek emergency care  follow up Dr. Chase Caller in 4-6 weeks and As needed  OV 05/21/2013 Chief Complaint  Patient presents with  . Cough    follow-up. Pt states cough is not improved at all.   53yr-old female reports for followup of cough that is chronic. Since seeing me in September 2014 on the cough score her cough appears one third better but subjectively she says that her cough is unimproved. She reports compliance with a sinus treatment measures and acid reflux treatment measures. Despite attending speech therapy and following my advice and the advice of nurse practitioner a month ago cough is unimproved. She is frustrated. RSI cough score is 25 and details are below. She is open to more advanced testing    05/31/2013 Follow up  Returns for follow up and review test results . Seen last week with cough . No significant change in cough.   Patient underwent a pulmonary function test on December 12 that showed moderate airflow obstruction with FEV1 is 1.35 L/53%. Ratio 62. No sign change BD   Patient underwent CT chest on December 9 that showed mild bronchiectasis in the right middle lobe. No evidence of interstitial lung disease. There was an incidental nodule in the right lung base, measuring 7 mm.  CT sinus showed no acute sinus disease  She denies any hemoptysis, orthopnea, PND, or leg swelling.  REC Trial of Breo Inhaler 1 puff daily , brush/rinse and gargle after use.  Continue on Delsym 2 tsp Twice daily  For cough As needed   Continue on Tessalon 200mg  Three times a day  As needed  Cough .  Continue use Chlortrimeton 4mg  1 in am and 2 At bedtime  -may make you sleepy.  Continue on Protonix daily before meal  Continue on Zantac At bedtime   Work on not coughing or throat clearing- use sips of  water, sugarless candy. NO mints .  Please contact office for sooner follow up if symptoms do not improve or worsen or seek emergency care  follow up Dr. Chase Caller in 4-6 weeks and As needed   OV 07/26/2013  Chief Complaint  Patient presents with  . Cough    follow-up. Pt states cough is same as last visti. no better.    Followup chronic cough  She says she is no better. For sinus she's on nasal spray, Flonase, last visit started on Chlor-Trimeton by my nurse practitioner. For acid reflux she is on Protonix and Zantac. 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 RSI cough score is 21. 4 obstructive lung disease she is on Brio but this also has not helped. She's willing to try Neurontin  she's been to speech therapy in the past. Neurontin made her gain weight and one of her chronic pain medications and she stopped it but has not had any adverse effects. I explained to her that Neurontin can be helpful and medications with irritable larynx or chronic cough or cyclical cough but can cause significant side effects of grogginess, fogginess, sleepiness and weight gain. She understands the side effects and is willing to give her short term trial of this  REportes good compliance but I doubt   REC Continue sinus, gerd, and inhaler therpay  - change breo to symbicort 2 puff bid  Take gabapentin 300mg  once daily x 5 days, then 300mg  twice daily x 5 days, then 300mg  three times daily to continue. If this makes you too sleepy or drowsy call us and we will cut your medication dosing down   Return in 4 - 8 weeks   - cough score at followup   OV 09/06/2013  Chief Complaint  Patient presents with  . Cough    Reports cough has improved since last OV--Has days it will try and come back     FU chronic cough:    She tells me she is "some" better but in reality the RSI cough score shows persistence of severe chronic cough. In terms of sinus control: She is only doing  her nasal saline drops as needed. She's not doing nasal steroids and she is unclear why. In terms of obstructive lung disease: She says that she is compliant with his Symbicort although she did not know the name initially. In terms of acid reflux: She says she's compliant with the proton pump and better. In terms of irritable larynx: I advised her to start gabapentin at last visit but she does be that she showed up at the pharmacy and the pharmacist told her that in his years of experience he never saw gabapentin being prescribed for chronic cough so she decided not to treat herself with gabapentin. I asked her why she can call here and she had no answer.  I've advised her 100% compliance with treatment measures if she needs to improve. Because of her on the side effects of gabapentin extensively and after listening to this and the potential benefits based on lancet paper she is willing to give this a try   COugh  - not improved - important you follow instructions carefully, closely and 100% of the time   -  Sinus   -  take generic fluticasone inhaler 2 squirts each nostril daily  - continue nasal saline spray - GERD  : continue medications for this  - Airflow obstruction  :  symbicort 2 puff twice daily  - Irritable larynx  : START e gabapentin 300mg  once daily x 5 days, then 300mg  twice daily x 5 days, then 300mg  three times daily to continue. If this makes you too sleepy or drowsy call us and we will cut your medication dosing down   Return in 4 - 8 weeks   - cough score at followup  OV .10/16/2013  Chief Complaint  Patient presents with  . Cough    follow up.  cough is 80% improved since last, but feels the Gabapentin has increased her appetite.   Followup chronic cough. At last visit after extensive conversation about compliance and the need to start gabapentin she did start gabapentin and she's been compliant with sinus, acid reflux and Symbicort inhaler. With these measures the  cough is 80% better. Objectively  as well RSI cough score is reduced at 13. However, she is reporting increased weight gain by 4 pounds and also increased appetite since starting gabapentin. Review of gabapentin side effects reveal that the incidence of this is between 1 and 3% only but she is convinced that the drug is  to blame. She does admit that gabapentin has helped her cough. Therefore she is requesting for a taper in the gabapentin dose as a trial.  Otherwise no problems. Of note, she has a 7 mm lung nodule in December 2014 and neck CT scan of the chest i needs to be done in summer 2015 Dr Lorenza Cambridge Reflux Symptom Index (> 13-15 suggestive of LPR cough) 02/25/2013 Score for past 2 months 02/25/2013 Score past severaldays after changing diet 05/21/2013 S/p sinus, gerd and speech Rx 07/26/2013 After starting breo in additioin to abpove 09/06/2013 ssaline sinus, no nasal steroid, symbiRx, ppi+ but no neurontin 10/16/2013 After starting gabapentin   Hoarseness of problem with voice 3 3 3 2 2 3   Clearing  Of Throat 5 3 3 3 4 3   Excess throat mucus or feeling of post nasal drip 3 0 4 4 5 2   Difficulty swallowing food, liquid or tablets 3 \\0  1 0 0 0  Cough after eating or lying down 3 2 4 5 5 1   Breathing difficulties or choking episodes 1 0 1 1 0 0  Troublesome or annoying cough 5 0 4 4 5 1   Sensation of something sticking in throat or lump in throat 5 3 3 4  4.5 1  Heartburn, chest pain, indigestion, or stomach acid coming up 5 1 3 1 5 2   TOTAL 33 12 25 21  30.5 13    Review of Systems  Constitutional: Negative for fever and unexpected weight change.  HENT: Negative for congestion, dental problem, ear pain, nosebleeds, postnasal drip, rhinorrhea, sinus pressure, sneezing, sore throat and trouble swallowing.   Eyes: Negative for redness and itching.  Respiratory: Negative for cough, chest tightness, shortness of breath and wheezing.   Cardiovascular: Negative for palpitations and leg swelling.   Gastrointestinal: Negative for nausea and vomiting.  Genitourinary: Negative for dysuria.  Musculoskeletal: Negative for joint swelling.  Skin: Negative for rash.  Neurological: Negative for headaches.  Hematological: Does not bruise/bleed easily.  Psychiatric/Behavioral: Negative for dysphoric mood. The patient is not nervous/anxious.        Objective:   Physical Exam  Vitals reviewed. Constitutional: She is oriented to person, place, and time. She appears well-developed and well-nourished. No distress.  Body mass index is 43.65 kg/(m^2).   HENT:  Head: Normocephalic and atraumatic.  Right Ear: External ear normal.  Left Ear: External ear normal.  Mouth/Throat: Oropharynx is clear and moist. No oropharyngeal exudate.  Eyes: Conjunctivae and EOM are normal. Pupils are equal, round, and reactive to light. Right eye exhibits no discharge. Left eye exhibits no discharge. No scleral icterus.  Neck: Normal range of motion. Neck supple. No JVD present. No tracheal deviation present. No thyromegaly present.  Cardiovascular: Normal rate, regular rhythm, normal heart sounds and intact distal pulses.  Exam reveals no gallop and no friction rub.   No murmur heard. Pulmonary/Chest: Effort normal and breath sounds normal. No respiratory distress. She has no wheezes. She has no rales. She exhibits no tenderness.  Abdominal: Soft. Bowel sounds are normal. She exhibits no distension and no mass. There is no tenderness. There is no rebound and no guarding.  Musculoskeletal: Normal range of motion. She exhibits no  edema and no tenderness.  Lymphadenopathy:    She has no cervical adenopathy.  Neurological: She is alert and oriented to person, place, and time. She has normal reflexes. No cranial nerve deficit. She exhibits normal muscle tone. Coordination normal.  Skin: Skin is warm and dry. No rash noted. She is not diaphoretic. No erythema. No pallor.  Psychiatric: She has a normal mood and affect.  Her behavior is normal. Judgment and thought content normal.       Assessment:     COugh  - much better after starting gabapentin but you are having side effects. So, will make a revised plan in BOLD   -  Sinus   -  continue generic fluticasone inhaler 2 squirts each nostril daily  - continue nasal saline spray - GERD  : continue medications for this  - Airflow obstruction  Continue symbicort 2 puff twice daily  - Irritable larynx  : Continue gabapentin but reduce to 300mg  once a NIGHT at bedtime (instea of 3  times a day)    Return  - in July/Augt 2015  - cough part 1 score at followup  - CT scan chest for lung nodule to be done prior to followup    Plan:

## 2013-10-16 NOTE — Patient Instructions (Signed)
COugh  - much better after starting gabapentin but you are having side effects. So, will make a revised plan in BOLD   -  Sinus   -  continue generic fluticasone inhaler 2 squirts each nostril daily  - continue nasal saline spray - GERD  : continue medications for this  - Airflow obstruction  Continue symbicort 2 puff twice daily  - Irritable larynx  : Continue gabapentin but reduce to 300mg once a NIGHT at bedtime (instea of 3  times a day)    Return  - in July/Augt 2015  - cough part 1 score at followup  - CT scan chest for lung nodule to be done prior to followup 

## 2013-11-01 ENCOUNTER — Ambulatory Visit (INDEPENDENT_AMBULATORY_CARE_PROVIDER_SITE_OTHER): Payer: Medicare Other | Admitting: Internal Medicine

## 2013-11-01 ENCOUNTER — Encounter: Payer: Self-pay | Admitting: Internal Medicine

## 2013-11-01 VITALS — BP 141/87 | HR 86 | Temp 97.7°F | Ht 64.0 in | Wt 255.7 lb

## 2013-11-01 DIAGNOSIS — R059 Cough, unspecified: Secondary | ICD-10-CM

## 2013-11-01 DIAGNOSIS — E1142 Type 2 diabetes mellitus with diabetic polyneuropathy: Secondary | ICD-10-CM

## 2013-11-01 DIAGNOSIS — R053 Chronic cough: Secondary | ICD-10-CM

## 2013-11-01 DIAGNOSIS — M549 Dorsalgia, unspecified: Secondary | ICD-10-CM

## 2013-11-01 DIAGNOSIS — I10 Essential (primary) hypertension: Secondary | ICD-10-CM

## 2013-11-01 DIAGNOSIS — R05 Cough: Secondary | ICD-10-CM

## 2013-11-01 DIAGNOSIS — E1140 Type 2 diabetes mellitus with diabetic neuropathy, unspecified: Secondary | ICD-10-CM

## 2013-11-01 DIAGNOSIS — R911 Solitary pulmonary nodule: Secondary | ICD-10-CM

## 2013-11-01 DIAGNOSIS — E1149 Type 2 diabetes mellitus with other diabetic neurological complication: Secondary | ICD-10-CM

## 2013-11-01 MED ORDER — OXYCODONE-ACETAMINOPHEN 5-325 MG PO TABS: 1.0000 | ORAL_TABLET | Freq: Three times a day (TID) | ORAL | Status: DC | PRN

## 2013-11-01 MED ORDER — INSULIN LISPRO PROT & LISPRO (75-25 MIX) 100 UNIT/ML KWIKPEN: 65.0000 [IU] | PEN_INJECTOR | Freq: Two times a day (BID) | SUBCUTANEOUS | Status: DC

## 2013-11-01 NOTE — Assessment & Plan Note (Addendum)
Lab Results  Component Value Date   HGBA1C 9.4 08/23/2013   HGBA1C 8.7 05/31/2013   HGBA1C 8.3 03/08/2013     Assessment: Diabetes control: poor control (HgbA1C >9%) Progress toward A1C goal:  improved Comments: blood sugar logs appear mostly improved with large sections of 200-300 sugars while she was out of insulin. Controlled sugars are around 130-180 in the mornings.   Plan: Medications:  continue current medications, humulog 75/25 65 units BID, metformin 1000 mg bid Home glucose monitoring: Frequency: 4 times a day Timing: before breakfast;before meals Instruction/counseling given: reminded to bring blood glucose meter & log to each visit, discussed the need for weight loss and discussed diet Educational resources provided: brochure;handout Self management tools provided: copy of home glucose meter download Other plans:   Sample given to patient:  Novolog 70/30 Vial (10 mL) Lot #  [id number] Exp. Date 01/30/14  Patient has been instructed regarding the correct time, dose, and frequency of taking this medication, including its desired effects and most common side effects.

## 2013-11-01 NOTE — Patient Instructions (Addendum)
We will give you some insulin to help until you can apply for the MAP program and we will give you a prescription to send in to them as well.   Continue using 65 units of the insulin twice a day. Also we would like to work on losing weight and exercising to help your body do better with insulin and your sugars.   We will see you back in about 1 month to check on your sugars.   Call us with problems or questions before then at [phone].  Exercise to Lose Weight Exercise and a healthy diet may help you lose weight. Your doctor may suggest specific exercises. EXERCISE IDEAS AND TIPS  Choose low-cost things you enjoy doing, such as walking, bicycling, or exercising to workout videos.  Take stairs instead of the elevator.  Walk during your lunch break.  Park your car further away from work or school.  Go to a gym or an exercise class.  Start with 5 to 10 minutes of exercise each day. Build up to 30 minutes of exercise 4 to 6 days a week.  Wear shoes with good support and comfortable clothes.  Stretch before and after working out.  Work out until you breathe harder and your heart beats faster.  Drink extra water when you exercise.  Do not do so much that you hurt yourself, feel dizzy, or get very short of breath. Exercises that burn about 150 calories:  Running 1  miles in 15 minutes.  Playing volleyball for 45 to 60 minutes.  Washing and waxing a car for 45 to 60 minutes.  Playing touch football for 45 minutes.  Walking 1  miles in 35 minutes.  Pushing a stroller 1  miles in 30 minutes.  Playing basketball for 30 minutes.  Raking leaves for 30 minutes.  Bicycling 5 miles in 30 minutes.  Walking 2 miles in 30 minutes.  Dancing for 30 minutes.  Shoveling snow for 15 minutes.  Swimming laps for 20 minutes.  Walking up stairs for 15 minutes.  Bicycling 4 miles in 15 minutes.  Gardening for 30 to 45 minutes.  Jumping rope for 15 minutes.  Washing  windows or floors for 45 to 60 minutes. Document Released: 07/02/2010 Document Revised: 08/22/2011 Document Reviewed: 07/02/2010 [facility] Patient Information 2014 [place], [place].

## 2013-11-01 NOTE — Assessment & Plan Note (Signed)
Advised that her weight is really impacting her health and advised to lose at least 10 pounds to start.

## 2013-11-01 NOTE — Progress Notes (Signed)
Subjective:     Patient ID: [name], female   DOB: [birth year], 53 y.o.   MRN: [id number]  HPI The patient is a 53 YO woman with PMH of HTN, DM II, hyperlipidemia, chronic back pain who presents for follow up of her diabetes. She has not changed her eating habits and has not been able to exercise. She thinks she is walking more at work and is surprised she has not lost any weight. She has been out of insulin off and on over the last month due to doughnut hole costs. She was out then bought some insulin and has been out for 3-4 days now. When she was on the insulin her morning sugars were about 130-180. She has not noticed any hypoglycemic or hyperglycemic episodes. She does not get low or chills. She is still following with the lung doctor about her cough and this is much improved. She is going back for her CT lung to follow on a lung nodule in June or July this year. She is not having problems with chest pain or SOB. She is continuing to take her other medications as prescribed. No fevers or chills at home.   Review of Systems  Constitutional: Negative for fever, chills, diaphoresis, activity change, appetite change, fatigue and unexpected weight change.  HENT: Negative for congestion, dental problem, drooling, ear discharge, ear pain, facial swelling, hearing loss, nosebleeds, postnasal drip, rhinorrhea, sinus pressure, sneezing, sore throat, tinnitus and trouble swallowing.   Eyes: Negative.   Respiratory: Positive for cough. Negative for choking, chest tightness, shortness of breath, wheezing and stridor.   Cardiovascular: Negative for chest pain, palpitations and leg swelling.  Gastrointestinal: Negative for nausea, vomiting, diarrhea, constipation and abdominal distention.  Musculoskeletal: Positive for arthralgias. Negative for gait problem, joint swelling, neck pain and neck stiffness.  Skin: Negative for color change, pallor, rash and wound.  Neurological: Negative for dizziness, tremors,  seizures, syncope, facial asymmetry, speech difficulty, weakness, light-headedness, numbness and headaches.  Hematological: Negative for adenopathy. Does not bruise/bleed easily.       Objective:   Physical Exam  Constitutional: She is oriented to person, place, and time. She appears well-developed and well-nourished. No distress.  HENT:  Head: Normocephalic and atraumatic.  Mouth/Throat: Oropharynx is clear and moist.  No tonsils and no discharge or erythema at the back of the throat.   Eyes: EOM are normal. Pupils are equal, round, and reactive to light.  Neck: Normal range of motion. Neck supple. No JVD present. No thyromegaly present.  Cardiovascular: Normal rate.   No murmur heard. Pulmonary/Chest: Effort normal and breath sounds normal. No respiratory distress. She has no wheezes. She has no rales. She exhibits no tenderness.  Good air flow heard throughout and no wheezing or crackles auscultated.  Abdominal: Soft. Bowel sounds are normal. She exhibits no distension. There is no tenderness. There is no rebound.  Musculoskeletal: Normal range of motion. She exhibits no edema and no tenderness.  Neurological: She is alert and oriented to person, place, and time. No cranial nerve deficit.  Skin: Skin is warm and dry. No rash noted. She is not diaphoretic. No erythema.       Assessment/Plan:   1. Please see problem oriented charting.  2. Disposition - Humulin 75/25 flexpen 65 units q AM and 65 units q PM. Refill on her percocet 5/325 1 q 8 hours prn # 90 no refills given today. She will work on improving her diet and exercising more. She will return in 1  month for insulin recheck. Physical rx given for her insulin today and papers given to her so she can apply for MAP during doughnut hole to get her insulin. Given sample of novolog 70/30 to help her until she can get MAP. Documented under problem oriented charting.

## 2013-11-01 NOTE — Assessment & Plan Note (Signed)
Repeat CT lung this summer and following with pulmonology. If no changes needs no further imaging.

## 2013-11-01 NOTE — Assessment & Plan Note (Signed)
BP Readings from Last 3 Encounters:  11/01/13 141/87  10/16/13 106/62  09/06/13 120/74    Lab Results  Component Value Date   NA 139 01/23/2013   K 3.7 01/23/2013   CREATININE 0.55 01/23/2013    Assessment: Blood pressure control: controlled Progress toward BP goal:  unchanged Comments:  Plan: Medications:  continue current medications, HCTZ 25 mg daily, losartan 50 mg daily Educational resources provided: brochure;handout;video Self management tools provided: instructions for home blood pressure monitoring Other plans:

## 2013-11-01 NOTE — Assessment & Plan Note (Signed)
Stable and advised her to continue following with pulmonology. She will return to get CT for lung nodule this summer.

## 2013-11-04 NOTE — Assessment & Plan Note (Signed)
COugh  - much better after starting gabapentin but you are having side effects. So, will make a revised plan in BOLD   -  Sinus   -  continue generic fluticasone inhaler 2 squirts each nostril daily  - continue nasal saline spray - GERD  : continue medications for this  - Airflow obstruction  Continue symbicort 2 puff twice daily  - Irritable larynx  : Continue gabapentin but reduce to 300mg  once a NIGHT at bedtime (instea of 3  times a day)    Return  - in July/Augt 2015  - cough part 1 score at followup  - CT scan chest for lung nodule to be done prior to followup

## 2013-11-09 NOTE — Progress Notes (Signed)
Case discussed with Dr. Kollar soon after the resident saw the patient.  We reviewed the resident's history and exam and pertinent patient test results.  I agree with the assessment, diagnosis, and plan of care documented in the resident's note. 

## 2013-11-12 ENCOUNTER — Encounter: Payer: Medicare Other | Attending: Internal Medicine | Admitting: *Deleted

## 2013-11-12 ENCOUNTER — Encounter: Payer: Self-pay | Admitting: *Deleted

## 2013-11-12 VITALS — Ht 64.0 in | Wt 253.5 lb

## 2013-11-12 DIAGNOSIS — Z713 Dietary counseling and surveillance: Secondary | ICD-10-CM | POA: Insufficient documentation

## 2013-11-12 DIAGNOSIS — E1140 Type 2 diabetes mellitus with diabetic neuropathy, unspecified: Secondary | ICD-10-CM

## 2013-11-12 DIAGNOSIS — E1142 Type 2 diabetes mellitus with diabetic polyneuropathy: Secondary | ICD-10-CM | POA: Insufficient documentation

## 2013-11-12 DIAGNOSIS — E1149 Type 2 diabetes mellitus with other diabetic neurological complication: Secondary | ICD-10-CM | POA: Insufficient documentation

## 2013-11-12 NOTE — Patient Instructions (Signed)
Plan:  Aim for 3 Carb Choices per meal (45 grams) +/- 1 either way  Aim for 0-2 Carbs per snack if hungry  Include protein in moderation with your meals and snacks Consider reading food labels for Total Carbohydrate of foods Continue checking BG at alternate times per day as directed by MD  Continue taking medication Novolin 70/30 insulin before breakfast and supper as directed by MD

## 2013-11-12 NOTE — Progress Notes (Signed)
Appt start time: 1030 end time:  1200.  Assessment:  Patient was seen on  11/12/13 for individual diabetes education. Lives with husband, they share the food buying and cooking. She states her husband is supportive. SMBG twice a day with reported range of 70 - 243 mg/dl including pre and post meals. 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  Current HbA1c: 9.4%  Preferred Learning Style:   No preference indicated   Learning Readiness:   Ready  Change in progress  MEDICATIONS: see list. Diabetes medication is Novolin 70/30 insulin @ 65 units twice a day  DIETARY INTAKE:  24-hr recall:  B ( AM): banana, coffee with cream and 2 tsp sugar,  Snk ( AM): yogurt, occasionally PNB crackers, water  L ( PM): brings or buys: left over casserole, OR fast food burger, medium fries, sweet tea or diet soda Snk ( PM): piece of hard candy OR popcorn D ( PM): lean meat, starch, vegetables, OR meal salad, water or diet tea or diet soda Snk ( PM): occasionally candy bar or chico stick Beverages: coffee, water, sweet tea, diet soda  Usual physical activity: walks and takes stairs at work  Estimated energy needs: 1400 calories 158 g carbohydrates 105 g protein 39 g fat  Progress Towards Goal(s):  In progress.   Nutritional Diagnosis:  NB-1.1 Food and nutrition-related knowledge deficit As related to diabetes.  As evidenced by A1c of 9.4%.    Intervention:  Nutrition counseling provided.  Discussed diabetes disease process and treatment options.  Discussed physiology of diabetes and role of obesity on insulin resistance.  Encouraged moderate weight reduction to improve glucose levels.  Discussed role of medications and diet in glucose control  Provided education on macronutrients on glucose levels.  Provided education on carb counting, importance  of regularly scheduled meals/snacks, and meal planning  Discussed effects of physical activity on glucose levels and long-term glucose control.  Recommended 150 minutes of physical activity/week.  Reviewed patient medications.  Discussed role of medication on blood glucose and possible side effects  Discussed blood glucose monitoring and interpretation.  Discussed recommended target ranges and individual ranges.    Described short-term complications: hyper- and hypo-glycemia.  Discussed causes,symptoms, and treatment options.  Discussed prevention, detection, and treatment of long-term complications.  Discussed the role of prolonged elevated glucose levels on body systems.  Discussed role of stress on blood glucose levels and discussed strategies to manage psychosocial issues.  Discussed recommendations for long-term diabetes self-care.  Provided checklist for medical, dental, and emotional self-care.  Plan:  Aim for 3 Carb Choices per meal (45 grams) +/- 1 either way  Aim for 0-2 Carbs per snack if hungry  Include protein in moderation with your meals and snacks Consider reading food labels for Total Carbohydrate of foods Continue checking BG at alternate times per day as directed by MD  Continue taking medication Novolin 70/30 insulin before breakfast and supper as directed by MD  Teaching Method Utilized:  Visual Auditory Hands on  Handouts given during visit include: Living Well with Diabetes Carb Counting and Food Label handouts Meal Plan Card  Insulin handout  Barriers to learning/adherence to lifestyle change: finances  Diabetes self-care support plan:   [facility] support group  Demonstrated degree of understanding via:  Teach Back   Monitoring/Evaluation:  Dietary intake, exercise, SMBG, reading  food labels, and body weight prn.

## 2013-12-02 ENCOUNTER — Encounter: Payer: Self-pay | Admitting: Internal Medicine

## 2013-12-02 ENCOUNTER — Ambulatory Visit (INDEPENDENT_AMBULATORY_CARE_PROVIDER_SITE_OTHER): Payer: Medicare Other | Admitting: Internal Medicine

## 2013-12-02 VITALS — BP 134/83 | HR 88 | Temp 97.7°F | Ht 64.0 in | Wt 259.1 lb

## 2013-12-02 DIAGNOSIS — E1149 Type 2 diabetes mellitus with other diabetic neurological complication: Secondary | ICD-10-CM

## 2013-12-02 DIAGNOSIS — I10 Essential (primary) hypertension: Secondary | ICD-10-CM

## 2013-12-02 DIAGNOSIS — E1140 Type 2 diabetes mellitus with diabetic neuropathy, unspecified: Secondary | ICD-10-CM

## 2013-12-02 DIAGNOSIS — R252 Cramp and spasm: Secondary | ICD-10-CM | POA: Insufficient documentation

## 2013-12-02 DIAGNOSIS — R911 Solitary pulmonary nodule: Secondary | ICD-10-CM

## 2013-12-02 DIAGNOSIS — E1142 Type 2 diabetes mellitus with diabetic polyneuropathy: Secondary | ICD-10-CM

## 2013-12-02 LAB — BASIC METABOLIC PANEL
BUN: 12 mg/dL (ref 6–23)
CHLORIDE: 98 meq/L (ref 96–112)
CO2: 34 mEq/L — ABNORMAL HIGH (ref 19–32)
Calcium: 9.8 mg/dL (ref 8.4–10.5)
Creat: 0.75 mg/dL (ref 0.50–1.10)
Glucose, Bld: 173 mg/dL — ABNORMAL HIGH (ref 70–99)
POTASSIUM: 3.9 meq/L (ref 3.5–5.3)
SODIUM: 140 meq/L (ref 135–145)

## 2013-12-02 LAB — POCT GLYCOSYLATED HEMOGLOBIN (HGB A1C): HEMOGLOBIN A1C: 8.6

## 2013-12-02 LAB — GLUCOSE, CAPILLARY: GLUCOSE-CAPILLARY: 170 mg/dL — AB (ref 70–99)

## 2013-12-02 LAB — CK: CK TOTAL: 442 U/L — AB (ref 7–177)

## 2013-12-02 NOTE — Progress Notes (Signed)
INTERNAL MEDICINE TEACHING ATTENDING ADDENDUM - Aldine Contes, MD: I reviewed and discussed with the resident Dr. Eula Fried, the patient's medical history, physical examination, diagnosis and results of pertinent tests and treatment and I agree with the patient's care as documented.

## 2013-12-02 NOTE — Assessment & Plan Note (Addendum)
Mainly lower extremities, claims more notable during sex, when she feels like her legs are numb but then spontaneously resolves. Does have hx of neuropathy.   -bmet -ck (on statin)  Addendum 12/03/13: CK elevated 442. Discussed with Dr. Dareen Piano, will hold statin for now (on pravastatin) and reassess by pcp in future months to see if any improvement of symptoms and CK. Will need to follow up lipid panel as well to see if statin still needed and if hyperlipidemia has improved. Would also recommend recheck  CK, vit D, and TSH on next visit in 1 month. Explained results and plan with [name] on the phone today who reports understanding.

## 2013-12-02 NOTE — Patient Instructions (Addendum)
General Instructions: Please bring your eye exam results  Your diabetes is improving! Great job! Keep up your insulin as you have been doing and let us know when your insurance will start covering it again.   Keep checking your sugars three times a day, exercise more and cut back on your portions for weight control  Follow up with ortho about your thumb  If your cramping gets worse let us know  Please bring your medicines with you each time you come to clinic.  Medicines may include prescription medications, over-the-counter medications, herbal remedies, eye drops, vitamins, or other pills.  Progress Toward Treatment Goals:  Treatment Goal 12/02/2013  Hemoglobin A1C -  Blood pressure at goal    Self Care Goals & Plans:  Self Care Goal 11/01/2013  Manage my medications take my medicines as prescribed; bring my medications to every visit; refill my medications on time; follow the sick day instructions if I am sick  Monitor my health keep track of my blood glucose; bring my glucose meter and log to each visit; check my feet daily  Eat healthy foods eat more vegetables; eat fruit for snacks and desserts; eat foods that are low in salt; eat baked foods instead of fried foods; eat smaller portions; drink diet soda or water instead of juice or soda  Be physically active find an activity I enjoy  Meeting treatment goals -    Home Blood Glucose Monitoring 12/02/2013  Check my blood sugar 3 times a day  When to check my blood sugar before meals     Care Management & Community Referrals:  Referral 11/01/2013  Referrals made for care management support none needed  Referrals made to community resources -

## 2013-12-02 NOTE — Assessment & Plan Note (Signed)
BP Readings from Last 3 Encounters:  12/02/13 134/83  11/01/13 141/87  10/16/13 106/62   Lab Results  Component Value Date   NA 139 01/23/2013   K 3.7 01/23/2013   CREATININE 0.55 01/23/2013    Assessment: Blood pressure control: controlled Progress toward BP goal:  at goal Comments:   Plan: Medications:  continue current medications continue hctz and cozaar

## 2013-12-02 NOTE — Assessment & Plan Note (Addendum)
Lab Results  Component Value Date   HGBA1C 8.6 12/02/2013   HGBA1C 9.4 08/23/2013   HGBA1C 8.7 05/31/2013    Assessment: Diabetes control: fair control Progress toward A1C goal:    Comments: improved today  Plan: Medications:  continue current medications currently on 70/30 from [facility] 65 unit am and pm until out of donut hole, will try to get CDE to follow up. Continue metformin 1000mg  bid Home glucose monitoring: Frequency: 3 times a day Timing: before meals Instruction/counseling given: reminded to bring blood glucose meter & log to each visit, reminded to bring medications to each visit, discussed the need for weight loss and discussed diet Educational resources provided: brochure Self management tools provided: copy of home glucose meter download Other plans: already had eye exam, need to get records

## 2013-12-02 NOTE — Progress Notes (Signed)
Subjective:   Patient ID: [name] female   DOB: 1960/11/20 53 y.o.   MRN: [id number]  HPI: [name] is a 53 y.o. female with DM2 (uncontrolled) and HTN presenting to opc today for diabetes follow up visit. Last A1C 9.4 in March 2015. Improved today to 8.6 with improved cbg's as well. cbg <300, usually <200 she claims. She has gained weight and says she will work harder on that with more exercise and diet control as we discussed. She still reports being in the "donut hole" with her insurance and does not know when she will get done. In the meantime she has been using 70/30 from [facility] 65 units am and pm. She denies any hypoglycemic episodes.  Lung nodule--follow up with pulmonary. CT chest ordered but not done yet. Reminded her to please have this done prior to her next appt.   Continues to report cramping, mostly in lower extremities and occasionally feels like her legs go numb during sex but resolves after.  Past Medical History  Diagnosis Date  . Chronic back pain   . Hyperlipidemia   . Hypertension   . Uterine fibroid     s/p hysterectomy  . Cocaine abuse     in remission  . Tobacco abuse   . Chronic leg pain     due to back pain  . Diabetes     with neuropathy  . RECTAL BLEEDING 12/09/2008    Annotation: s/p EGD 7/08 mild gastritis, s/p colonoscopy 7/08- benign polyp  s/p polypectomy and isolated diverticulum.  Qualifier: Diagnosis of  By: Ditzler RN, Debra    . Sleep apnea    Current Outpatient Prescriptions  Medication Sig Dispense Refill  . acyclovir (ZOVIRAX) 400 MG tablet Take 1 tablet (400 mg total) by mouth 2 (two) times daily.  60 tablet  3  . albuterol (PROVENTIL HFA;VENTOLIN HFA) 108 (90 BASE) MCG/ACT inhaler Inhale 2 puffs into the lungs every 4 (four) hours as needed for wheezing or shortness of breath.  18 g  5  . aspirin 81 MG EC tablet Take 81 mg by mouth daily.        . Calcium Carbonate-Vitamin D (CALCIUM 600+D) 600-400 MG-UNIT per tablet Take 1 tablet  by mouth daily.        . chlorpheniramine (CHLORPHEN) 4 MG tablet Take 1 tab in AM and 2 tabs in PM      . dextromethorphan (DELSYM) 30 MG/5ML liquid Take 30 mg by mouth 2 (two) times daily.      Marland Kitchen gabapentin (NEURONTIN) 300 MG capsule Take one tablet daily x 5 days, then 1 tablet twice daily x 5 days, then 1 tablet three times daily and continue.  100 capsule  2  . hydrochlorothiazide (HYDRODIURIL) 25 MG tablet TAKE ONE TABLET BY MOUTH ONCE DAILY  30 tablet  6  . Insulin Lispro Prot & Lispro (HUMALOG MIX 75/25 KWIKPEN) (75-25) 100 UNIT/ML Kwikpen Inject 65 Units into the skin 2 (two) times daily.  40 mL  6  . losartan (COZAAR) 50 MG tablet Take 1 tablet (50 mg total) by mouth daily.  30 tablet  6  . metFORMIN (GLUCOPHAGE) 1000 MG tablet Take 1 tablet (1,000 mg total) by mouth 2 (two) times daily with a meal.  60 tablet  6  . mometasone (NASONEX) 50 MCG/ACT nasal spray Place 2 sprays into the nose daily.  17 g  11  . Multiple Vitamin (MULTIVITAMIN WITH MINERALS) TABS tablet Take 1 tablet by mouth daily.      Marland Kitchen  oxyCODONE-acetaminophen (PERCOCET/ROXICET) 5-325 MG per tablet Take 1 tablet by mouth every 8 (eight) hours as needed.  90 tablet  0  . pantoprazole (PROTONIX) 40 MG tablet Take 1 tablet (40 mg total) by mouth 2 (two) times daily.  60 tablet  3  . pravastatin (PRAVACHOL) 40 MG tablet Take 1 tablet (40 mg total) by mouth every evening.  90 tablet  4  . [DISCONTINUED] esomeprazole (NEXIUM) 20 MG capsule Take 1 capsule (20 mg total) by mouth daily.  90 capsule  3  . [DISCONTINUED] simvastatin (ZOCOR) 40 MG tablet TAKE ONE TABLET BY MOUTH EVERY DAY  60 tablet  6   No current facility-administered medications for this visit.   Family History  Problem Relation Age of Onset  . Diabetes Father   . Heart disease Father   . Hypertension Father   . Diabetes Mother   . Cancer Mother     brain  . Hypertension Mother    History   Social History  . Marital Status: Married    Spouse Name: N/A      Number of Children: N/A  . 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    Social History Main Topics  . Smoking status: Former Smoker -- 0.50 packs/day for 20 years    Types: Cigarettes    Quit date: 07/23/1998  . Smokeless tobacco: Never Used  . Alcohol Use: No     Comment: recovering addict clean for 5 years  . Drug Use: No     Comment: recovering addict clean for 5 years  . Sexual Activity: Yes    Birth Control/ Protection: Surgical   Other Topics Concern  . Not on file   Social History Narrative   Unemployed but has started taking community college classes (interested in accounting) since having back surgery, which has improved pain and mobility. Hopes to return to work soon. Lives with husband and daughter.  Uninsured.   Review of Systems:  Constitutional:  Denies fever, chills  HEENT:  Chronic cough  Respiratory:  Denies SOB  Cardiovascular:  Denies chest pain  Gastrointestinal:  Denies nausea, vomiting, abdominal pain  Genitourinary:  Denies dysuria  Musculoskeletal:  R thumb pain, cramping, neuropathy in legs  Skin:  Denies pallor, rash and wound.   Neurological:  Denies syncope   Objective:  Physical Exam: Filed Vitals:   12/02/13 1021  BP: 134/83  Pulse: 88  Temp: 97.7 F (36.5 C)  TempSrc: Oral  Height: 5\' 4"  (1.626 m)  Weight: 259 lb 1.6 oz (117.527 kg)  SpO2: 97%   Vitals reviewed. General: sitting in chair, NAD HEENT: EOMI Cardiac: RRR, no rubs, murmurs or gallops Pulm: clear to auscultation bilaterally Abd: soft, obese, nondistended, BS present Ext: warm and well perfused, trace lower extremity edema, no tenderness to palpation Neuro: alert and oriented X3, strength grossly intact  Assessment & Plan:  Discussed with Dr. Dareen Piano In donut hole with insurance, will have CDE follow up

## 2013-12-02 NOTE — Assessment & Plan Note (Signed)
CT chest ordered by pulmonary. She claims she plans to get in July prior to appt with pulm

## 2013-12-03 NOTE — Addendum Note (Signed)
Addended by: Wilber Oliphant on: 12/03/2013 02:44 PM   Modules accepted: Orders, Medications

## 2013-12-09 ENCOUNTER — Other Ambulatory Visit: Payer: Self-pay | Admitting: Dietician

## 2013-12-09 NOTE — Telephone Encounter (Signed)
Did not hear back from patient today. Will forward to attending physician since her PCP is no longer here and triage nurse.

## 2013-12-09 NOTE — Telephone Encounter (Signed)
CDE was forwarded a message from triage nurse from patient requesting a prescription for [facility] brand Novolin insulin (Novolin 70/30?) that she is using so it will count towards her dough nut hole amount. She says she called [employer] and they told her that her  insulin is $500 because she is in the doughnut hole. She could go to [facility] to have them order her medicine for free vs. Continue with the [facility] insulin.She needs 4 vials/months (~$100)

## 2013-12-10 ENCOUNTER — Encounter: Payer: Self-pay | Admitting: Dietician

## 2013-12-10 MED ORDER — "INSULIN SYRINGE-NEEDLE U-100 31G X 15/64"" 1 ML MISC": Status: DC

## 2013-12-10 MED ORDER — INSULIN NPH ISOPHANE & REGULAR (70-30) 100 UNIT/ML ~~LOC~~ SUSP: 65.0000 [IU] | Freq: Two times a day (BID) | SUBCUTANEOUS | Status: DC

## 2013-12-10 NOTE — Telephone Encounter (Signed)
Will work on getting papers to [facility] to obtain Humalog Mix kwikpen 75/25 while in the Doughnut hole) , in the meantime, asks for Rx for relion 70/30 and syringes from [facility]. I have pended the syringe order for your convenience.

## 2013-12-10 NOTE — Telephone Encounter (Signed)
Prescription sent for Novolin 70/30 at same dose of 65 units twice a day (with breakfast and supper).

## 2013-12-12 ENCOUNTER — Telehealth: Payer: Self-pay | Admitting: Dietician

## 2013-12-12 MED ORDER — INSULIN NPH ISOPHANE & REGULAR (70-30) 100 UNIT/ML ~~LOC~~ SUSP: 65.0000 [IU] | Freq: Two times a day (BID) | SUBCUTANEOUS | Status: DC

## 2013-12-12 NOTE — Telephone Encounter (Signed)
Left message for patient that we would not be able to do PA for insulin until next week. Humulin 70/30 is covered without a PA.  Seems best option will be for her to get her insulin from [facility] as soon as possible.

## 2013-12-12 NOTE — Telephone Encounter (Signed)
Sent rx for humulin 70/30 65 U BID to [facility].  Venita Lick, MD

## 2013-12-12 NOTE — Addendum Note (Signed)
Addended by: Drucilla Schmidt E on: 12/12/2013 06:41 PM   Modules accepted: Orders, Medications

## 2013-12-18 ENCOUNTER — Telehealth: Payer: Self-pay | Admitting: *Deleted

## 2013-12-18 NOTE — Telephone Encounter (Addendum)
Received PA request from pt's insurance [facility] [phone]) for her generic protonix 40mg  tabs one tab twice daily.  Insurance will only pay for once a day dosing.  Pt has an appt on 07/22 with DrMcLean. Pt informed that insurance requires documentation of the following below:  1. Clinical rational for dose exceeding the maxium recommended dose  2. Can the daily dose be achieved with a lower qty of a higher strength    Pt instructed to try once a day dosing and keep already scheduled appt so she can let office know if her symptoms are controlled.[name], Ripley Lovecchio Cassady7/8/20155:00 PM

## 2013-12-19 ENCOUNTER — Telehealth: Payer: Self-pay | Admitting: Dietician

## 2013-12-19 MED ORDER — INSULIN NPH ISOPHANE & REGULAR (70-30) 100 UNIT/ML ~~LOC~~ SUSP: 65.0000 [IU] | Freq: Two times a day (BID) | SUBCUTANEOUS | Status: DC

## 2013-12-19 NOTE — Telephone Encounter (Signed)
[facility] told her the Humulin 70/30 was not covered. They paid for syringes which he will hold until he gets an insulin Rx that is covered.Marland Kitchen Has not gone to [facility]. Will do so early next week. She request a call back about her insulin.  I called [facility], her insurance did cover the Humulin 70/30- it will cost her 55.80 instead of 149.00 for 1 vial because that is how much was ordered. Will ask for new rx or right amount  To be called in (she needs 4 vials/month will be enough for 65 units twice daily for 30 days).

## 2013-12-19 NOTE — Telephone Encounter (Signed)
Called [employer] [phone]   For a PA for Novolin(ReliOn) 70/30 after [facility] told me the Humulin was 221.87 for a month's supply. (Cash price> 500.00).  They are faxing the non formulary form.

## 2013-12-20 ENCOUNTER — Other Ambulatory Visit: Payer: Self-pay | Admitting: Internal Medicine

## 2013-12-20 ENCOUNTER — Telehealth: Payer: Self-pay | Admitting: Dietician

## 2013-12-20 MED ORDER — INSULIN NPH ISOPHANE & REGULAR (70-30) 100 UNIT/ML ~~LOC~~ SUSP: SUBCUTANEOUS | Status: DC

## 2013-12-20 NOTE — Telephone Encounter (Signed)
Received call from [employer] re: PA for Novolin 70/30; has been approved 12/20/13 through 12/21/14. Call [phone],they will inform the member.

## 2013-12-23 ENCOUNTER — Other Ambulatory Visit: Payer: Self-pay | Admitting: *Deleted

## 2013-12-23 NOTE — Telephone Encounter (Signed)
Fax from [facility] - need directions.  Thanks

## 2013-12-25 ENCOUNTER — Ambulatory Visit (INDEPENDENT_AMBULATORY_CARE_PROVIDER_SITE_OTHER)
Admission: RE | Admit: 2013-12-25 | Discharge: 2013-12-25 | Disposition: A | Discharge status: Home / Self Care | Payer: Medicare Other | Source: Ambulatory Visit | Attending: Internal Medicine | Admitting: Internal Medicine

## 2013-12-25 DIAGNOSIS — R911 Solitary pulmonary nodule: Secondary | ICD-10-CM

## 2013-12-26 MED ORDER — INSULIN NPH ISOPHANE & REGULAR (70-30) 100 UNIT/ML ~~LOC~~ SUSP: 65.0000 [IU] | Freq: Two times a day (BID) | SUBCUTANEOUS | Status: DC

## 2013-12-29 ENCOUNTER — Other Ambulatory Visit: Payer: Self-pay | Admitting: Internal Medicine

## 2014-01-01 ENCOUNTER — Ambulatory Visit (INDEPENDENT_AMBULATORY_CARE_PROVIDER_SITE_OTHER): Payer: Medicare Other | Admitting: Internal Medicine

## 2014-01-01 ENCOUNTER — Telehealth: Payer: Self-pay | Admitting: Emergency Medicine

## 2014-01-01 ENCOUNTER — Encounter: Payer: Self-pay | Admitting: Internal Medicine

## 2014-01-01 VITALS — BP 134/78 | HR 97 | Temp 97.0°F | Ht 64.0 in | Wt 259.1 lb

## 2014-01-01 DIAGNOSIS — I10 Essential (primary) hypertension: Secondary | ICD-10-CM

## 2014-01-01 DIAGNOSIS — M7702 Medial epicondylitis, left elbow: Secondary | ICD-10-CM

## 2014-01-01 DIAGNOSIS — M6530 Trigger finger, unspecified finger: Secondary | ICD-10-CM

## 2014-01-01 DIAGNOSIS — E1142 Type 2 diabetes mellitus with diabetic polyneuropathy: Secondary | ICD-10-CM

## 2014-01-01 DIAGNOSIS — E1149 Type 2 diabetes mellitus with other diabetic neurological complication: Secondary | ICD-10-CM

## 2014-01-01 DIAGNOSIS — E785 Hyperlipidemia, unspecified: Secondary | ICD-10-CM

## 2014-01-01 DIAGNOSIS — M7700 Medial epicondylitis, unspecified elbow: Secondary | ICD-10-CM | POA: Insufficient documentation

## 2014-01-01 DIAGNOSIS — R252 Cramp and spasm: Secondary | ICD-10-CM

## 2014-01-01 DIAGNOSIS — IMO0002 Reserved for concepts with insufficient information to code with codable children: Secondary | ICD-10-CM

## 2014-01-01 DIAGNOSIS — M549 Dorsalgia, unspecified: Secondary | ICD-10-CM

## 2014-01-01 DIAGNOSIS — E1140 Type 2 diabetes mellitus with diabetic neuropathy, unspecified: Secondary | ICD-10-CM

## 2014-01-01 LAB — LIPID PANEL
Cholesterol: 219 mg/dL — ABNORMAL HIGH (ref 0–200)
HDL: 38 mg/dL — AB (ref >39)
LDL CALC: 154 mg/dL — AB (ref 0–99)
Total CHOL/HDL Ratio: 5.8 Ratio
Triglycerides: 136 mg/dL (ref <150)
VLDL: 27 mg/dL (ref 0–40)

## 2014-01-01 LAB — GLUCOSE, CAPILLARY: Glucose-Capillary: 242 mg/dL — ABNORMAL HIGH (ref 70–99)

## 2014-01-01 LAB — TSH: TSH: 0.88 u[IU]/mL (ref 0.350–4.500)

## 2014-01-01 LAB — CK: CK TOTAL: 377 U/L — AB (ref 7–177)

## 2014-01-01 MED ORDER — IBUPROFEN 800 MG PO TABS: 800.0000 mg | ORAL_TABLET | Freq: Three times a day (TID) | ORAL | Status: DC | PRN

## 2014-01-01 MED ORDER — OXYCODONE-ACETAMINOPHEN 5-325 MG PO TABS: 1.0000 | ORAL_TABLET | Freq: Three times a day (TID) | ORAL | Status: DC | PRN

## 2014-01-01 NOTE — Assessment & Plan Note (Signed)
Controlled today 

## 2014-01-01 NOTE — Assessment & Plan Note (Addendum)
cbg 242 today  F/u 02/2014  Continue insulin (70/30) 65 units bid and Metformin 1000 mg bid

## 2014-01-01 NOTE — Progress Notes (Signed)
   Subjective:    Patient ID: [name], female    DOB: [birth year], 53 y.o.   MRN: [id number]  HPI Comments: 53 y.o PMH DM 2 with neuropathy (8.6 % 6/22/15with cbg 242), HTN (BP 134/78) HLD, obesity, chronic pain (disc herniation L5-S1)  She presents for  1) Pain -c/o pain in b/l thumbs with h/o trigger finger. She is right handed and right thumb pain is worse than left.  Pain has been worse x 1 month.  She has seen hand ortho in the past and pain improved with steroid inj. Pain score is 10/10 relieved min. With pain meds (narcotics).  Her thumbs intermittently lock.  She has trouble holding a pencil in her right hand.  She states pain in thumbs is worse with movement and she tries massage, cold/hot which helps.  She has noted swelling in R thumb>left.   -c/o pain in mid inner left elbow that radiates to left upper forearm x 1 week.  She has had decreased ROM and it has been hard to comb her hair at times.  She reports slight swelling in the area aas well. Denies repetitive movements  -requests rx refill of pain medication  2)Recent elevation of CK here for repeat labs (CK, lipid panel (she has had banana and coffee today), tsh, vitamin D).  After stopping statin (Pravastatin 40 mg qd) muscle cramps are improved.    SH: works as Water quality scientist  ROS see above      Review of Systems     Objective:   Physical Exam  Nursing note and vitals reviewed. Constitutional: She is oriented to person, place, and time. Vital signs are normal. She appears well-developed and well-nourished. She is cooperative. No distress.  HENT:  Head: Normocephalic and atraumatic.  Mouth/Throat: Oropharynx is clear and moist and mucous membranes are normal. No oropharyngeal exudate.  Eyes: Conjunctivae are normal. Pupils are equal, round, and reactive to light. Right eye exhibits no discharge. Left eye exhibits no discharge. No scleral icterus.  Cardiovascular: Normal rate, regular rhythm, S1 normal, S2  normal and normal heart sounds.   No murmur heard. Pulmonary/Chest: Effort normal and breath sounds normal. No respiratory distress. She has no wheezes.  Abdominal: Soft. Bowel sounds are normal. There is no tenderness.  Musculoskeletal:       Left elbow: She exhibits normal range of motion and no swelling. Tenderness found.       Arms: Slight decreased ROM right thumb not left ROM intact mild ttp and pain with ROM over medial left elbow not over olecrenon process  Neurological: She is alert and oriented to person, place, and time. Gait normal.  Skin: Skin is warm, dry and intact. No rash noted. She is not diaphoretic.  Psychiatric: She has a normal mood and affect. Her speech is normal and behavior is normal. Judgment and thought content normal. Cognition and memory are normal.          Assessment & Plan:  F/u 02/2014 for DM

## 2014-01-01 NOTE — Telephone Encounter (Signed)
Called and left message for pt. Informed pt that the directions for her gabapentin have changed per the last OV note from TID to qhs. New refill sent to requesting pharmacy.

## 2014-01-01 NOTE — Assessment & Plan Note (Signed)
Rx refill narcotic today

## 2014-01-01 NOTE — Assessment & Plan Note (Signed)
R>L  Will refer back to ortho Try NSAIDs 800 mg tid prn x 7 days Thumb splints

## 2014-01-01 NOTE — Patient Instructions (Addendum)
General Instructions: Try Ibuprofen as needed (no more than recommended dose daily) for thumb pain and arm pain and wear splints  I will refer you to orthopedics again Follow up middle of September with your regular doctor   Treatment Goals:  Goals (1 Years of Data) as of 01/01/14         As of Today 12/02/13 11/01/13 10/16/13 09/06/13     Blood Pressure    . Blood Pressure < 140/90  134/78 134/83 141/87 106/62 120/74     Result Component    . HEMOGLOBIN A1C < 7.0   8.6       . LDL CALC < 100            Progress Toward Treatment Goals:  Treatment Goal 01/01/2014  Hemoglobin A1C unchanged  Blood pressure at goal    Self Care Goals & Plans:  Self Care Goal 01/01/2014  Manage my medications take my medicines as prescribed; bring my medications to every visit; refill my medications on time; follow the sick day instructions if I am sick  Monitor my health keep track of my blood glucose; bring my glucose meter and log to each visit; keep track of my blood pressure; check my feet daily; bring my blood pressure log to each visit; keep track of my weight  Eat healthy foods drink diet soda or water instead of juice or soda; eat more vegetables; eat foods that are low in salt; eat baked foods instead of fried foods; eat fruit for snacks and desserts; eat smaller portions  Be physically active find an activity I enjoy  Meeting treatment goals maintain the current self-care plan    Home Blood Glucose Monitoring 01/01/2014  Check my blood sugar 3 times a day  When to check my blood sugar before meals     Care Management & Community Referrals:  Referral 01/01/2014  Referrals made for care management support -  Referrals made to community resources none          Medial Epicondylitis (Golfer's Elbow) with Rehab Medial epicondylitis involves inflammation and pain around the inner (medial) portion of the elbow. This pain is caused by inflammation of the tendons in the forearm that flex  (bring down) the wrist. Medial epicondylitis is also called golfer's elbow, because it is common among golfers. However, it may occur in any individual who flexes the wrist regularly. If medial epicondylitis is left untreated, it may become a chronic problem. SYMPTOMS   Pain, tenderness, or inflammation over the inner (medial) side of the elbow.  Pain or weakness with gripping activities.  Pain that increases with wrist twisting motions (using a screwdriver, playing golf, bowling). CAUSES  Medial epicondylitis is caused by inflammation of the tendons that flex the wrist. Causes of injury may include:  Chronic, repetitive stress and strain to the tendons that run from the wrist and forearm to the elbow.  Sudden strain on the forearm, including wrist snap when serving balls with racquet sports, or throwing a baseball. RISK INCREASES WITH:  Sports or occupations that require repetitive and/or strenuous forearm and wrist movements (pitching a baseball, golfing, carpentry).  Poor wrist and forearm strength and flexibility.  Failure to warm up properly before activity.  Resuming activity before healing, rehabilitation, and conditioning are complete. PREVENTION   Warm up and stretch properly before activity.  Maintain physical fitness:  Strength, flexibility, and endurance.  Cardiovascular fitness.  Wear and use properly fitted equipment.  Learn and use proper technique and have  a coach correct improper technique.  Wear a tennis elbow (counterforce) brace. PROGNOSIS  The course of this condition depends on the degree of the injury. If treated properly, acute cases (symptoms lasting less than 4 weeks) are often resolved in 2 to 6 weeks. Chronic (longer lasting cases) often resolve in 3 to 6 months, but may require physical therapy. RELATED COMPLICATIONS   Frequently recurring symptoms, resulting in a chronic problem. Properly treating the problem the first time decreases frequency  of recurrence.  Chronic inflammation, scarring, and partial tendon tear, requiring surgery.  Delayed healing or resolution of symptoms. TREATMENT  Treatment first involves the use of ice and medicine, to reduce pain and inflammation. Strengthening and stretching exercises may reduce discomfort, if performed regularly. These exercises may be performed at home, if the condition is an acute injury. Chronic cases may require a referral to a physical therapist for evaluation and treatment. Your caregiver may advise a corticosteroid injection to help reduce inflammation. Rarely, surgery is needed. MEDICATION  If pain medicine is needed, nonsteroidal anti-inflammatory medicines (aspirin and ibuprofen), or other minor pain relievers (acetaminophen), are often advised.  Do not take pain medicine for 7 days before surgery.  Prescription pain relievers may be given, if your caregiver thinks they are needed. Use only as directed and only as much as you need.  Corticosteroid injections may be recommended. These injections should be reserved only for the most severe cases, because they can only be given a certain number of times. HEAT AND COLD  Cold treatment (icing) should be applied for 10 to 15 minutes every 2 to 3 hours for inflammation and pain, and immediately after activity that aggravates your symptoms. Use ice packs or an ice massage.  Heat treatment may be used before performing stretching and strengthening activities prescribed by your caregiver, physical therapist, or athletic trainer. Use a heat pack or a warm water soak. SEEK MEDICAL CARE IF: Symptoms get worse or do not improve in 2 weeks, despite treatment. EXERCISES  RANGE OF MOTION (ROM) AND STRETCHING EXERCISES - Epicondylitis, Medial (Golfer's Elbow) These exercises may help you when beginning to rehabilitate your injury. Your symptoms may go away with or without further involvement from your physician, physical therapist or athletic  trainer. While completing these exercises, remember:   Restoring tissue flexibility helps normal motion to return to the joints. This allows healthier, less painful movement and activity.  An effective stretch should be held for at least 30 seconds.  A stretch should never be painful. You should only feel a gentle lengthening or release in the stretched tissue. RANGE OF MOTION - Wrist Flexion, Active-Assisted  Extend your right / left elbow with your fingers pointing down.*  Gently pull the back of your hand towards you, until you feel a gentle stretch on the top of your forearm.  Hold this position for __________ seconds. Repeat __________ times. Complete this exercise __________ times per day.  *If directed by your physician, physical therapist or athletic trainer, complete this stretch with your elbow bent, rather than extended. RANGE OF MOTION - Wrist Extension, Active-Assisted  Extend your right / left elbow and turn your palm upwards.*  Gently pull your palm and fingertips back, so your wrist extends and your fingers point more toward the ground.  You should feel a gentle stretch on the inside of your forearm.  Hold this position for __________ seconds. Repeat __________ times. Complete this exercise __________ times per day. *If directed by your physician, physical therapist or  athletic trainer, complete this stretch with your elbow bent, rather than extended. STRETCH - Wrist Extension   Place your right / left fingertips on a tabletop leaving your elbow slightly bent. Your fingers should point backwards.  Gently press your fingers and palm down onto the table, by straightening your elbow. You should feel a stretch on the inside of your forearm.  Hold this position for __________ seconds. Repeat __________ times. Complete this stretch __________ times per day.  STRENGTHENING EXERCISES - Epicondylitis, Medial (Golfer's Elbow) These exercises may help you when beginning to  rehabilitate your injury. They may resolve your symptoms with or without further involvement from your physician, physical therapist or athletic trainer. While completing these exercises, remember:   Muscles can gain both the endurance and the strength needed for everyday activities through controlled exercises.  Complete these exercises as instructed by your physician, physical therapist or athletic trainer. Increase the resistance and repetitions only as guided.  You may experience muscle soreness or fatigue, but the pain or discomfort you are trying to eliminate should never worsen during these exercises. If this pain does get worse, stop and make sure you are following the directions exactly. If the pain is still present after adjustments, discontinue the exercise until you can discuss the trouble with your caregiver. STRENGTH - Wrist Flexors  Sit with your right / left forearm palm-up, and fully supported on a table or countertop. Your elbow should be resting below the height of your shoulder. Allow your wrist to extend over the edge of the surface.  Loosely holding a __________ weight, or a piece of rubber exercise band or tubing, slowly curl your hand up toward your forearm.  Hold this position for __________ seconds. Slowly lower the wrist back to the starting position in a controlled manner. Repeat __________ times. Complete this exercise __________ times per day.  STRENGTH - Wrist Extensors  Sit with your right / left forearm palm-down and fully supported. Your elbow should be resting below the height of your shoulder. Allow your wrist to extend over the edge of the surface.  Loosely holding a __________ weight, or a piece of rubber exercise band or tubing, slowly curl your hand up toward your forearm.  Hold this position for __________ seconds. Slowly lower the wrist back to the starting position in a controlled manner. Repeat __________ times. Complete this exercise __________ times  per day.  STRENGTH - Ulnar Deviators  Stand with a ____________________ weight in your right / left hand, or sit while holding a rubber exercise band or tubing, with your healthy arm supported on a table or countertop.  Move your wrist so that your pinkie travels toward your forearm and your thumb moves away from your forearm.  Hold this position for __________ seconds and then slowly lower the wrist back to the starting position. Repeat __________ times. Complete this exercise __________ times per day STRENGTH - Grip   Grasp a tennis ball, a dense sponge, or a large, rolled sock in your hand.  Squeeze as hard as you can, without increasing any pain.  Hold this position for __________ seconds. Release your grip slowly. Repeat __________ times. Complete this exercise __________ times per day.  STRENGTH - Forearm Supinators   Sit with your right / left forearm supported on a table, keeping your elbow below shoulder height. Rest your hand over the edge, palm down.  Gently grip a hammer or a soup ladle.  Without moving your elbow, slowly turn your palm and  hand upward to a "thumbs-up" position.  Hold this position for __________ seconds. Slowly return to the starting position. Repeat __________ times. Complete this exercise __________ times per day.  STRENGTH - Forearm Pronators  Sit with your right / left forearm supported on a table, keeping your elbow below shoulder height. Rest your hand over the edge, palm up.  Gently grip a hammer or a soup ladle.  Without moving your elbow, slowly turn your palm and hand upward to a "thumbs-up" position.  Hold this position for __________ seconds. Slowly return to the starting position. Repeat __________ times. Complete this exercise __________ times per day.  Document Released: 05/30/2005 Document Revised: 08/22/2011 Document Reviewed: 09/11/2008 [facility] Patient Information 2015 [place], [place]. This information is not intended to replace  advice given to you by your health care provider. Make sure you discuss any questions you have with your health care provider.  Trigger Finger Trigger finger (digital tendinitis and stenosing tenosynovitis) is a common disorder that causes an often painful catching of the fingers or thumb. It occurs as a clicking, snapping, or locking of a finger in the palm of the hand. This is caused by a problem with the tendons that flex or bend the fingers sliding smoothly through their sheaths. The condition may occur in any finger or a couple fingers at the same time.  The finger may lock with the finger curled or suddenly straighten out with a snap. This is more common in patients with rheumatoid arthritis and diabetes. Left untreated, the condition may get worse to the point where the finger becomes locked in flexion, like making a fist, or less commonly locked with the finger straightened out. CAUSES   Inflammation and scarring that lead to swelling around the tendon sheath.  Repeated or forceful movements.  Rheumatoid arthritis, an autoimmune disease that affects joints.  Gout.  Diabetes mellitus. SIGNS AND SYMPTOMS  Soreness and swelling of your finger.  A painful clicking or snapping as you bend and straighten your finger. DIAGNOSIS  Your health care provider will do a physical exam of your finger to diagnose trigger finger. TREATMENT   Splinting for 6-8 weeks may be helpful.  Nonsteroidal anti-inflammatory medicines (NSAIDs) can help to relieve the pain and inflammation.  Cortisone injections, along with splinting, may speed up recovery. Several injections may be required. Cortisone may give relief after one injection.  Surgery is another treatment that may be used if conservative treatments do not work. Surgery can be minor, without incisions (a cut does not have to be made), and can be done with a needle through the skin.  Other surgical choices involve an open procedure in which the  surgeon opens the hand through a small incision and cuts the pulley so the tendon can again slide smoothly. Your hand will still work fine. HOME CARE INSTRUCTIONS  Apply ice to the injured area, twice per day:  Put ice in a plastic bag.  Place a towel between your skin and the bag.  Leave the ice on for 20 minutes, 3-4 times a day.  Rest your hand often. MAKE SURE YOU:   Understand these instructions.  Will watch your condition.  Will get help right away if you are not doing well or get worse. Document Released: 03/19/2004 Document Revised: 01/30/2013 Document Reviewed: 10/30/2012 [facility] Patient Information 2015 [place], [place]. This information is not intended to replace advice given to you by your health care provider. Make sure you discuss any questions you have with your health  care provider.  

## 2014-01-01 NOTE — Assessment & Plan Note (Addendum)
Will repeat CK today, better since stopping pravastatin Will check TSH, vitamin D per last office visit and check lipid panel to see if statin indicated  Continue to hold statin for now and reassess for need in the future

## 2014-01-01 NOTE — Assessment & Plan Note (Signed)
Lipid Panel     Component Value Date/Time   CHOL 174 01/11/2013 1446   TRIG 219* 01/11/2013 1446   HDL 36* 01/11/2013 1446   CHOLHDL 4.8 01/11/2013 1446   VLDL 44* 01/11/2013 1446   LDLCALC 94 01/11/2013 1446   Repeat lipid panel today

## 2014-01-01 NOTE — Assessment & Plan Note (Signed)
Will try NSAIDS x 1 week Can discuss with ortho if not improved  Given information

## 2014-01-02 ENCOUNTER — Encounter: Payer: Self-pay | Admitting: Internal Medicine

## 2014-01-02 LAB — VITAMIN D 25 HYDROXY (VIT D DEFICIENCY, FRACTURES): VIT D 25 HYDROXY: 36 ng/mL (ref 30–89)

## 2014-01-02 NOTE — Progress Notes (Signed)
INTERNAL MEDICINE TEACHING ATTENDING ADDENDUM - Robson Trickey, MD: I reviewed and discussed at the time of visit with the resident Dr. McLean, the patient's medical history, physical examination, diagnosis and results of pertinent tests and treatment and I agree with the patient's care as documented.  

## 2014-01-15 NOTE — Progress Notes (Signed)
Quick Note:  lmtcb ______ 

## 2014-01-16 NOTE — Progress Notes (Signed)
Quick Note:  lmtcb ______ 

## 2014-01-21 NOTE — Progress Notes (Signed)
Quick Note:  Pt returned call. Informed pt of results and recs per MR. Appt made for 02/13/2014. Pt verbalized understanding and denied any further questions or concerns at this time. ______

## 2014-01-21 NOTE — Telephone Encounter (Signed)
Pt returned call. Pt stated she has been taking the Gabapentin only at bedtime. Pt aware rx sent. Nothing further needed.

## 2014-02-13 ENCOUNTER — Encounter: Payer: Self-pay | Admitting: Internal Medicine

## 2014-02-13 ENCOUNTER — Ambulatory Visit (INDEPENDENT_AMBULATORY_CARE_PROVIDER_SITE_OTHER): Payer: Medicare Other | Admitting: Internal Medicine

## 2014-02-13 VITALS — BP 124/72 | HR 103 | Ht 64.0 in | Wt 260.0 lb

## 2014-02-13 DIAGNOSIS — R911 Solitary pulmonary nodule: Secondary | ICD-10-CM

## 2014-02-13 DIAGNOSIS — R05 Cough: Secondary | ICD-10-CM

## 2014-02-13 DIAGNOSIS — R053 Chronic cough: Secondary | ICD-10-CM

## 2014-02-13 MED ORDER — GABAPENTIN 300 MG PO CAPS: 300.0000 mg | ORAL_CAPSULE | Freq: Every day | ORAL | Status: DC

## 2014-02-13 MED ORDER — BUDESONIDE-FORMOTEROL FUMARATE 80-4.5 MCG/ACT IN AERO: 2.0000 | INHALATION_SPRAY | Freq: Two times a day (BID) | RESPIRATORY_TRACT | Status: DC

## 2014-02-13 NOTE — Progress Notes (Signed)
Subjective:    Patient ID: [name], female    DOB: December 20, 1960, 53 y.o.   MRN: [id number]  HPI   IOV 02/25/2013  53 year old nonsmoker obese female. Accompanied by her husband. Chief complaint is chronic cough.  Cough is of insidious onset a year ago. Was severe in intensity. And was progressive. Quality was a dry cough. There is associated ticklish sensation in her throat and constant clearing and gagging. In April 2014 was diagnosed to have esophageal dysmotility and in July 2014 saw Dr. Verdia Kuba who adjusted her proton pump inhibitor and change her diet. After this in the last several weeks her cough significantly improved and almost resolved. In between he esophagogram in seeing gastroenterologist Dr. Collene Mares she did see Dr. Melony Overly of ENT specialty who apparently did not see any abnormal with her vocal cords but treated her for sinusitis and this did not help her cough.   Cough relevant history   - Sinus/allergies  - Denies any problems. Never on nasal steroids. But does have some postnasal drip  - GI/reflux disease  - July 2008 had endoscopy which showed mild gastritis. She is on proton pump inhibitor  - Body mass index is 42.99 kg/(m^2).  - esophagogram Aug 2014: IMPRESSION:  Mild esophageal dysmotility.  Otherwise negative esophagram.  Original Report Authenticated By: Julian Hy, M.D.   - SAw DR Collene Mares July 2014 and PPI adjusted and Diet changed-> after this cough resolved   - Hypertension  -  She has hypertension but she is not on ACE inhibitor  - Pulmonary history  -  reports that she quit smoking about 14 years ago. Her smoking use included Cigarettes. She has a 10 pack-year smoking history. She has never used smokeless tobacco.   - CT chest 2006    -   Findings: No dissection is evident. No filling defect is identified in the pulmonary arterial tree to suggest pulmonary embolus. In the lateral basal segment of the right lower lobe there is some  atelectasis adjacent to a small region of nodularity measuring 6 mm in diameter. This nodularity likely simply relates to atelectasis, but a true pulmonary nodule is difficult to exclude. I would recommend follow up limited noncontrast CT in 3 months time in order to reassess this region.  There is no hilar or mediastinal adenopathy.  IMPRESSION:  No embolus. Mild nodularity associated with right lower lobe subsegmental atx; recommend followup limited evaluation in 3 months in order to ensure that this clears or fails to progress. The main purpose of the followup is to rule out the statistically unlikely possibility that this represents early malignancy.    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  #Sinus  - continue take generic fluticasone inhaler 2 squirts each nostril daily - START 3% nasal saline spray at night 2 squietrs made by a company called [employer]  #ACid reflux  - most important reason for your cough  - folllow advice of Dr Collene Mares 100% without fail  #Irritable larynx  -2- 3 days of complete voice rest without whispering or talking - See [facility] CArl Schinke of neuro rehab for speech therapy  #followup  - 4-6 weeks with me or my CMA [name] with cough score at followupo - If cough still a problem, will consider CT chest or neck and methacholine challenge test and Rx with neurontin  04/23/2013 Follow up Cough  6 week follow up - reports cough improves for a few days, then worsens again.Marland Kitchen Cough seems to wax and wane .  Not using anything to control cough .  Has sinus drip and drainage esp at night.  No fever, discolored mucus, chest pain, orthopnea or edema.  CXR 01/2013 with no acute findings.  [name] Cough score 23 today   Begin Delsym 2 tsp Twice daily   Begin Tessalon 200mg  Three times a day   Begin Chlortrimeton 4mg  1 in am and 2 At bedtime  -may make you sleepy.    Continue on Protonix daily before meal  Continue on Zantac At bedtime   Work on not coughing or throat clearing.  Please contact office for sooner follow up if symptoms do not improve or worsen or seek emergency care  follow up Dr. Chase Caller in 4-6 weeks and As needed  OV 05/21/2013 Chief Complaint  Patient presents with  . Cough    follow-up. Pt states cough is not improved at all.   53yr-old female reports for followup of cough that is chronic. Since seeing me in September 2014 on the cough score her cough appears one third better but subjectively she says that her cough is unimproved. She reports compliance with a sinus treatment measures and acid reflux treatment measures. Despite attending speech therapy and following my advice and the advice of nurse practitioner a month ago cough is unimproved. She is frustrated. RSI cough score is 25 and details are below. She is open to more advanced testing    05/31/2013 Follow up  Returns for follow up and review test results . Seen last week with cough . No significant change in cough.   Patient underwent a pulmonary function test on December 12 that showed moderate airflow obstruction with FEV1 is 1.35 L/59%. Ratio 62. No sign change BD   Patient underwent CT chest on December 9 that showed mild bronchiectasis in the right middle lobe. No evidence of interstitial lung disease. There was an incidental nodule in the right lung base, measuring 7 mm.  CT sinus showed no acute sinus disease  She denies any hemoptysis, orthopnea, PND, or leg swelling.  REC Trial of Breo Inhaler 1 puff daily , brush/rinse and gargle after use.  Continue on Delsym 2 tsp Twice daily  For cough As needed   Continue on Tessalon 200mg  Three times a day  As needed  Cough .  Continue use Chlortrimeton 4mg  1 in am and 2 At bedtime  -may make you sleepy.  Continue on Protonix daily before meal  Continue on Zantac At bedtime   Work on not coughing or throat clearing-  use sips of water, sugarless candy. NO mints .  Please contact office for sooner follow up if symptoms do not improve or worsen or seek emergency care  follow up Dr. Chase Caller in 4-6 weeks and As needed   OV 07/26/2013  Chief Complaint  Patient presents with  . Cough    follow-up. Pt states cough is same as last visti. no better.    Followup chronic cough  She says she is no better. For sinus she's on nasal spray, Flonase, last visit started on Chlor-Trimeton by my nurse practitioner. For acid reflux she is on Protonix and Zantac. 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 RSI cough score is 21. 4 obstructive lung disease she is on Brio but this also has not helped. She's willing  to try Neurontin she's been to speech therapy in the past. Neurontin made her gain weight and one of her chronic pain medications and she stopped it but has not had any adverse effects. I explained to her that Neurontin can be helpful and medications with irritable larynx or chronic cough or cyclical cough but can cause significant side effects of grogginess, fogginess, sleepiness and weight gain. She understands the side effects and is willing to give her short term trial of this  REportes good compliance but I doubt   REC Continue sinus, gerd, and inhaler therpay  - change breo to symbicort 2 puff bid  Take gabapentin 300mg  once daily x 5 days, then 300mg  twice daily x 5 days, then 300mg  three times daily to continue. If this makes you too sleepy or drowsy call us and we will cut your medication dosing down   Return in 4 - 8 weeks   - cough score at followup   OV 09/06/2013  Chief Complaint  Patient presents with  . Cough    Reports cough has improved since last OV--Has days it will try and come back     FU chronic cough:    She tells me she is "some" better but in reality the RSI cough score shows persistence of severe chronic cough. In terms of sinus control: She is  only doing her nasal saline drops as needed. She's not doing nasal steroids and she is unclear why. In terms of obstructive lung disease: She says that she is compliant with his Symbicort although she did not know the name initially. In terms of acid reflux: She says she's compliant with the proton pump and better. In terms of irritable larynx: I advised her to start gabapentin at last visit but she does be that she showed up at the pharmacy and the pharmacist told her that in his years of experience he never saw gabapentin being prescribed for chronic cough so she decided not to treat herself with gabapentin. I asked her why she can call here and she had no answer.  I've advised her 100% compliance with treatment measures if she needs to improve. Because of her on the side effects of gabapentin extensively and after listening to this and the potential benefits based on lancet paper she is willing to give this a try   COugh  - not improved - important you follow instructions carefully, closely and 100% of the time   -  Sinus   -  take generic fluticasone inhaler 2 squirts each nostril daily  - continue nasal saline spray - GERD  : continue medications for this  - Airflow obstruction  :  symbicort 2 puff twice daily  - Irritable larynx  : START e gabapentin 300mg  once daily x 5 days, then 300mg  twice daily x 5 days, then 300mg  three times daily to continue. If this makes you too sleepy or drowsy call us and we will cut your medication dosing down   Return in 4 - 8 weeks   - cough score at followup  OV .10/16/2013  Chief Complaint  Patient presents with  . Cough    follow up.  cough is 80% improved since last, but feels the Gabapentin has increased her appetite.   Followup chronic cough. At last visit after extensive conversation about compliance and the need to start gabapentin she did start gabapentin and she's been compliant with sinus, acid reflux and Symbicort inhaler. With these  measures the cough is  80% better. Objectively as well RSI cough score is reduced at 13. However, she is reporting increased weight gain by 4 pounds and also increased appetite since starting gabapentin. Review of gabapentin side effects reveal that the incidence of this is between 1 and 3% only but she is convinced that the drug is  to blame. She does admit that gabapentin has helped her cough. Therefore she is requesting for a taper in the gabapentin dose as a trial.  Otherwise no problems. Of note, she has a 7 mm lung nodule in December 2014 and neck CT scan of the chest i needs to be done in summer 2015  REC COugh  - much better after starting gabapentin but you are having side effects. So, will make a revised plan in BOLD   -  Sinus   -  continue generic fluticasone inhaler 2 squirts each nostril daily  - continue nasal saline spray - GERD  : continue medications for this  - Airflow obstruction  Continue symbicort 2 puff twice daily  - Irritable larynx  : Continue gabapentin but reduce to 300mg  once a NIGHT at bedtime (instea of 3  times a day)    Return  - in July/Augt 2015  - cough part 1 score at followup  - CT scan chest for lung nodule to be done prior to followup  OV 02/13/2014  Chief Complaint  Patient presents with  . Follow-up    Pt here to review CT results. Pt states her breathing is doing well. Pt states she only gets SOB when climbing steps. Pt c/o mild cough with intermittent mucous production in morning, brown in color. Pt denies CP.     Followup chronic cough.  And lung nodule  Chronic cough: Now on gabapentin low dose qhs, ompliant with sinus, acid reflux and Symbicort inhaler. With these measures the cough is better. Objectively as well RSI cough score is reduced at 13 but same as before. Ideally would like to get rid of it but accepts this is best possible.   OBesity: Body mass index is 44.61 kg/(m^2). CT July 2015 has fatty  Liver and I warned about rfuture  cirrhosis risk. Weight  continues and is worsening. Advised to talk to Drucilla Schmidt, MD and advised low glycemic diet sheet. Can help with ge reflyux that can help cough  Nodule:had CT July 2015 chest: nodule stable since 2006.  No furhter CT needed    Dr Lorenza Cambridge Reflux Symptom Index (> 13-15 suggestive of LPR cough) 02/25/2013 Score for past 2 months 05/21/2013 S/p sinus, gerd and speech Rx 07/26/2013 After starting breo in additioin to abpove 09/06/2013 ssaline sinus, no nasal steroid, symbiRx, ppi+ but no neurontin 10/16/2013 After starting gabapentin  02/13/2014   Hoarseness of problem with voice 3 3 2 2 3 3   Clearing  Of Throat 5 3 3 4 3 3   Excess throat mucus or feeling of post nasal drip 3 4 4 5 2 2   Difficulty swallowing food, liquid or tablets 3 1 0 0 0 0  Cough after eating or lying down 3 4 5 5 1 1   Breathing difficulties or choking episodes 1 1 1  0 0 0  Troublesome or annoying cough 5 4 4 5 1 1   Sensation of something sticking in throat or lump in throat 5 3 4  4.5 1 0  Heartburn, chest pain, indigestion, or stomach acid coming up 5 3 1 5 2 3   TOTAL 33 25 21 30.5 13 13  Review of Systems  Constitutional: Negative for fever and unexpected weight change.  HENT: Negative for congestion, dental problem, ear pain, nosebleeds, postnasal drip, rhinorrhea, sinus pressure, sneezing, sore throat and trouble swallowing.   Eyes: Negative for redness and itching.  Respiratory: Positive for cough and shortness of breath. Negative for chest tightness and wheezing.   Cardiovascular: Negative for palpitations and leg swelling.  Gastrointestinal: Negative for nausea and vomiting.  Genitourinary: Negative for dysuria.  Musculoskeletal: Negative for joint swelling.  Skin: Negative for rash.  Neurological: Negative for headaches.  Hematological: Does not bruise/bleed easily.  Psychiatric/Behavioral: Negative for dysphoric mood. The patient is not nervous/anxious.    Current outpatient  prescriptions:acyclovir (ZOVIRAX) 400 MG tablet, Take 1 tablet (400 mg total) by mouth 2 (two) times daily., Disp: 60 tablet, Rfl: 3;  albuterol (PROVENTIL HFA;VENTOLIN HFA) 108 (90 BASE) MCG/ACT inhaler, Inhale 2 puffs into the lungs every 4 (four) hours as needed for wheezing or shortness of breath., Disp: 18 g, Rfl: 5;  aspirin 81 MG EC tablet, Take 81 mg by mouth daily.  , Disp: , Rfl:  Calcium Carbonate-Vitamin D (CALCIUM 600+D) 600-400 MG-UNIT per tablet, Take 1 tablet by mouth daily.  , Disp: , Rfl: ;  chlorpheniramine (CHLORPHEN) 4 MG tablet, Take 1 tab in AM and 2 tabs in PM, Disp: , Rfl: ;  dextromethorphan (DELSYM) 30 MG/5ML liquid, Take 30 mg by mouth 2 (two) times daily as needed. , Disp: , Rfl: ;  gabapentin (NEURONTIN) 300 MG capsule, Take 1 capsule (300 mg total) by mouth at bedtime., Disp: 30 capsule, Rfl: 3 hydrochlorothiazide (HYDRODIURIL) 25 MG tablet, TAKE ONE TABLET BY MOUTH ONCE DAILY, Disp: 30 tablet, Rfl: 6;  ibuprofen (ADVIL,MOTRIN) 800 MG tablet, Take 1 tablet (800 mg total) by mouth every 8 (eight) hours as needed., Disp: 30 tablet, Rfl: 0;  insulin NPH-regular Human (NOVOLIN 70/30) (70-30) 100 UNIT/ML injection, Inject 65 Units into the skin 2 (two) times daily with a meal. (with breakfast and supper), Disp: 40 mL, Rfl: 3 Insulin Syringe-Needle U-100 31G X 15/64" 1 ML MISC, Use to give injections twice daily.  Dx code ICD-9 250.00., Disp: 100 each, Rfl: 1;  losartan (COZAAR) 50 MG tablet, Take 1 tablet (50 mg total) by mouth daily., Disp: 30 tablet, Rfl: 6;  metFORMIN (GLUCOPHAGE) 1000 MG tablet, Take 1 tablet (1,000 mg total) by mouth 2 (two) times daily with a meal., Disp: 60 tablet, Rfl: 6 mometasone (NASONEX) 50 MCG/ACT nasal spray, Place 2 sprays into the nose daily as needed., Disp: , Rfl: ;  Multiple Vitamin (MULTIVITAMIN WITH MINERALS) TABS tablet, Take 1 tablet by mouth daily., Disp: , Rfl: ;  oxyCODONE-acetaminophen (PERCOCET/ROXICET) 5-325 MG per tablet, Take 1 tablet by  mouth every 8 (eight) hours as needed., Disp: 90 tablet, Rfl: 0;  pantoprazole (PROTONIX) 40 MG tablet, Take 40 mg by mouth daily., Disp: , Rfl:  [DISCONTINUED] esomeprazole (NEXIUM) 20 MG capsule, Take 1 capsule (20 mg total) by mouth daily., Disp: 90 capsule, Rfl: 3;  [DISCONTINUED] simvastatin (ZOCOR) 40 MG tablet, TAKE ONE TABLET BY MOUTH EVERY DAY, Disp: 60 tablet, Rfl: 6     Objective:   Physical Exam   Filed Vitals:   02/13/14 1332  Height: 5\' 4"  (1.626 m)  Weight: 117.935 kg (260 lb)   Filed Vitals:   02/13/14 1332  BP: 124/72  Pulse: 103  Height: 5\' 4"  (1.626 m)  Weight: 260 lb (117.935 kg)  SpO2: 97%    Physical Exam  Vitals reviewed.  Constitutional: She is oriented to person, place, and time. She appears well-developed and well-nourished. No distress.  Body mass index is 44.61 kg/(m^2).    HENT:  Head: Normocephalic and atraumatic.  Right Ear: External ear normal.  Left Ear: External ear normal.  Mouth/Throat: Oropharynx is clear and moist. No oropharyngeal exudate.  Eyes: Conjunctivae and EOM are normal. Pupils are equal, round, and reactive to light. Right eye exhibits no discharge. Left eye exhibits no discharge. No scleral icterus.  Neck: Normal range of motion. Neck supple. No JVD present. No tracheal deviation present. No thyromegaly present.  Cardiovascular: Normal rate, regular rhythm, normal heart sounds and intact distal pulses.  Exam reveals no gallop and no friction rub.   No murmur heard. Pulmonary/Chest: Effort normal and breath sounds normal. No respiratory distress. She has no wheezes. She has no rales. She exhibits no tenderness.  Abdominal: Soft. Bowel sounds are normal. She exhibits no distension and no mass. There is no tenderness. There is no rebound and no guarding.  Musculoskeletal: Normal range of motion. She exhibits no edema and no tenderness.  Lymphadenopathy:    She has no cervical adenopathy.  Neurological: She is alert and oriented to  person, place, and time. She has normal reflexes. No cranial nerve deficit. She exhibits normal muscle tone. Coordination normal.  Skin: Skin is warm and dry. No rash noted. She is not diaphoretic. No erythema. No pallor.  Psychiatric: She has a normal mood and affect. Her behavior is normal. Judgment and thought content normal.       Assessment:      COugh  - much better but still not out of woods - need to lose weight to get rid of cough. tHis will help improve the reflux piece of cough   -  Sinus   -  continue generic fluticasone inhaler 2 squirts each nostril daily  - continue nasal saline spray - GERD  : continue medications for this  - but lose weight - take low glycemic diet sheet eat foods in left lane only  - Airflow obstruction  Continue symbicort 2 puff twice daily  - Irritable larynx  : Continue gabapentin @  300mg  once a NIGHT at bedtime    - Lung nodule  - no change since 2006; no more CT scans needed  Return  - in FEb 2016  - cough part 1 score at followup

## 2014-02-13 NOTE — Patient Instructions (Addendum)
COugh  - much better but still not out of woods - need to lose weight to get rid of cough. tHis will help improve the reflux piece of cough   -  Sinus   -  continue generic fluticasone inhaler 2 squirts each nostril daily  - continue nasal saline spray - GERD  : continue medications for this  - but lose weight - take low glycemic diet sheet eat foods in left lane only  - Airflow obstruction  Continue symbicort 2 puff twice daily  - Irritable larynx  : Continue gabapentin @  300mg  once a NIGHT at bedtime    - Lung nodule  - no change since 2006; no more CT scans needed  Return  - in FEb 2016  - cough part 1 score at followup

## 2014-02-27 ENCOUNTER — Ambulatory Visit (INDEPENDENT_AMBULATORY_CARE_PROVIDER_SITE_OTHER): Payer: Medicare Other | Admitting: Internal Medicine

## 2014-02-27 ENCOUNTER — Encounter: Payer: Self-pay | Admitting: Internal Medicine

## 2014-02-27 VITALS — BP 140/67 | HR 97 | Temp 97.8°F | Ht 64.0 in | Wt 259.7 lb

## 2014-02-27 DIAGNOSIS — M549 Dorsalgia, unspecified: Secondary | ICD-10-CM

## 2014-02-27 DIAGNOSIS — Z23 Encounter for immunization: Secondary | ICD-10-CM

## 2014-02-27 DIAGNOSIS — G4762 Sleep related leg cramps: Secondary | ICD-10-CM

## 2014-02-27 MED ORDER — DIAZEPAM 2 MG PO TABS: 2.0000 mg | ORAL_TABLET | Freq: Every evening | ORAL | Status: DC | PRN

## 2014-02-27 MED ORDER — INSULIN NPH ISOPHANE & REGULAR (70-30) 100 UNIT/ML ~~LOC~~ SUSP: 65.0000 [IU] | Freq: Two times a day (BID) | SUBCUTANEOUS | Status: DC

## 2014-02-27 MED ORDER — OXYCODONE-ACETAMINOPHEN 5-325 MG PO TABS: 1.0000 | ORAL_TABLET | Freq: Three times a day (TID) | ORAL | Status: DC | PRN

## 2014-02-27 NOTE — Progress Notes (Signed)
Medicine attending: I personally interviewed this patient and reviewed her medical problems, and current medications, along with resident physician Dr. Drucilla Schmidt and I concur with her evaluation and management plan. Murriel Hopper, M.D., [place]

## 2014-02-27 NOTE — Patient Instructions (Addendum)
Take the valium at night; keep track of whether it helps decrease the leg cramps. It may help your back pain as well.  If the cramps go away, we will call you to restart your statin (medication for your cholesterol).

## 2014-02-27 NOTE — Progress Notes (Signed)
Subjective:     Patient ID: Marchel Foote, female   DOB: 02-21-1961, 53 y.o.   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 She is here for medication refills and a flu vaccine.  Back Pain: patient describes 8/10 throbbing pain in her buttocks that has been present since 2011. Percocet and lying down decrease the pain. Moving exacerbates it. She has had PT for the pain, which helped somewhat; she still does the exercises they gave her.  Muscle Cramps in Lower Extremities: These have been present for several months; they occur at night in both legs, R>L. They are mostly in her lower legs (points to calves), but occasionally also occur in her thighs.   Of note, the patient's pravastatin 40 mg was stopped at last visit and CK was taken (442 in July and 377 in August). Her lipid panel is listed below.   Review of Systems General: no recent illness Skin: no rashes or lesions HEENT: no headaches, no changes in vision or hearing Cardiac: no chest pain or palpitations  Respiratory: no shortness of breath GI: no changes in BMs Urinary: no dysuria, hematuria, frequency Vascular: no calf pain on walking Msk: see HPI Endocrine: no temperature intolerance, no changes in weight Psychiatric: no changes     Objective:   Physical Exam Appearance: sitting in chair, in NAD HEENT: AT/[place], PERRL, EOMi, no lymphadenopathy Heart: RRR, normal S1S2 Lungs: CTAB Abdomen: BS+, soft, nontender Musculoskeletal: nontender to palpation, no joint swelling Extremities: nonedematous lower extremities, well-healed, old scar on right lower extremity, nontender to deep palpation, DPs intact b/l Neurologic: lower extremity strength 5/5, sensation intact to light and sharp touch, no tremor Skin: no skin changes or rashes  Lipid Panel  01/01/14:  Chol 219  Trig 136 HDL 38 LDL 154 VLDL 27  CK: 442 (2 months ago) --> 377 (one month ago)     Assessment:     Ms. Ellenberger is a 53 yo woman here for medication refills and the flu vaccine. She continues to experience nocturnal lower extremity cramping, despite being off of her statin for the last 2 months.     Plan:     Lower Extremity Nocturnal Muscle Cramps: - will try valium at night to prevent cramps - given instructions on stretching before bed, taking warm shower/bath or icing afflicted areas to prevent cramps - was taken off pravastatin in July, 2015; will consider resuming in several weeks (do not want confusion by changing two medications at once)  Back Pain:  - stable; may benefit from valium  DM2: logbook 120-240s with several outliers to 477 (highs occur before dinner)  Patient received flu vaccine today. Percocet refilled.

## 2014-03-04 ENCOUNTER — Telehealth: Payer: Self-pay | Admitting: *Deleted

## 2014-03-04 NOTE — Telephone Encounter (Signed)
Call from pt - insurance will do approve Humulin 70/30; needs to be change to Relion 70/30 insulin.  Thanks

## 2014-03-11 NOTE — Telephone Encounter (Signed)
Correction insurance will not approve Humulin 70/30 only Relion 70/30.

## 2014-03-14 ENCOUNTER — Other Ambulatory Visit: Payer: Self-pay | Admitting: Internal Medicine

## 2014-03-14 MED ORDER — INSULIN NPH ISOPHANE & REGULAR (70-30) 100 UNIT/ML ~~LOC~~ SUSP: 65.0000 [IU] | Freq: Two times a day (BID) | SUBCUTANEOUS | Status: DC

## 2014-04-10 ENCOUNTER — Other Ambulatory Visit: Payer: Self-pay | Admitting: *Deleted

## 2014-04-10 DIAGNOSIS — E1140 Type 2 diabetes mellitus with diabetic neuropathy, unspecified: Secondary | ICD-10-CM

## 2014-04-10 DIAGNOSIS — I10 Essential (primary) hypertension: Secondary | ICD-10-CM

## 2014-04-10 MED ORDER — METFORMIN HCL 1000 MG PO TABS: 1000.0000 mg | ORAL_TABLET | Freq: Two times a day (BID) | ORAL | Status: DC

## 2014-04-10 MED ORDER — HYDROCHLOROTHIAZIDE 25 MG PO TABS: 25.0000 mg | ORAL_TABLET | Freq: Every day | ORAL | Status: DC

## 2014-04-14 ENCOUNTER — Encounter: Payer: Self-pay | Admitting: Internal Medicine

## 2014-05-29 ENCOUNTER — Other Ambulatory Visit: Payer: Self-pay | Admitting: *Deleted

## 2014-05-29 DIAGNOSIS — M549 Dorsalgia, unspecified: Secondary | ICD-10-CM

## 2014-05-29 MED ORDER — OXYCODONE-ACETAMINOPHEN 5-325 MG PO TABS: 1.0000 | ORAL_TABLET | Freq: Three times a day (TID) | ORAL | Status: DC | PRN

## 2014-05-29 NOTE — Telephone Encounter (Signed)
Last refill 9/17 Pt # [phone] ex [phone]

## 2014-07-18 ENCOUNTER — Other Ambulatory Visit: Payer: Self-pay | Admitting: Internal Medicine

## 2014-07-24 ENCOUNTER — Telehealth: Payer: Self-pay | Admitting: Internal Medicine

## 2014-07-24 NOTE — Telephone Encounter (Signed)
Called pt and aware no samples at this time. WCB

## 2014-08-28 ENCOUNTER — Ambulatory Visit (INDEPENDENT_AMBULATORY_CARE_PROVIDER_SITE_OTHER): Payer: Medicare Other | Admitting: Internal Medicine

## 2014-08-28 ENCOUNTER — Encounter: Payer: Self-pay | Admitting: Internal Medicine

## 2014-08-28 VITALS — BP 138/67 | HR 94 | Temp 98.2°F | Ht 64.0 in | Wt 260.5 lb

## 2014-08-28 DIAGNOSIS — E1140 Type 2 diabetes mellitus with diabetic neuropathy, unspecified: Secondary | ICD-10-CM

## 2014-08-28 DIAGNOSIS — M549 Dorsalgia, unspecified: Secondary | ICD-10-CM

## 2014-08-28 DIAGNOSIS — I10 Essential (primary) hypertension: Secondary | ICD-10-CM

## 2014-08-28 DIAGNOSIS — R252 Cramp and spasm: Secondary | ICD-10-CM

## 2014-08-28 DIAGNOSIS — Z794 Long term (current) use of insulin: Secondary | ICD-10-CM

## 2014-08-28 LAB — POCT GLYCOSYLATED HEMOGLOBIN (HGB A1C): HEMOGLOBIN A1C: 8.8

## 2014-08-28 LAB — GLUCOSE, CAPILLARY: Glucose-Capillary: 210 mg/dL — ABNORMAL HIGH (ref 70–99)

## 2014-08-28 MED ORDER — DIAZEPAM 2 MG PO TABS: 2.0000 mg | ORAL_TABLET | Freq: Every evening | ORAL | Status: DC | PRN

## 2014-08-28 MED ORDER — INSULIN NPH ISOPHANE & REGULAR (70-30) 100 UNIT/ML ~~LOC~~ SUSP: 75.0000 [IU] | Freq: Two times a day (BID) | SUBCUTANEOUS | Status: DC

## 2014-08-28 MED ORDER — OXYCODONE-ACETAMINOPHEN 5-325 MG PO TABS: 1.0000 | ORAL_TABLET | Freq: Three times a day (TID) | ORAL | Status: DC | PRN

## 2014-08-28 NOTE — Progress Notes (Addendum)
Subjective:    Patient ID: [name], female    DOB: [birth year], 54 y.o.   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 She has been feeling good. She is hoping to lose some weight, and plans to do so by increasing her exercise as the weather gets better this Spring. In January, she completed a short course of prednisone after she had a trigger finger release surgery at an outside hospital. She noted her finger sticks to be running high at that time. She has a home health nurse who suggested she ask to be started on Invokana, Byetta and an ACE-inhibitor for her diabetes. She would like to discuss these options.  Current Outpatient Prescriptions  Medication Sig Dispense Refill  . acyclovir (ZOVIRAX) 400 MG tablet Take 1 tablet (400 mg total) by mouth 2 (two) times daily. 60 tablet 3  . albuterol (PROVENTIL HFA;VENTOLIN HFA) 108 (90 BASE) MCG/ACT inhaler Inhale 2 puffs into the lungs every 4 (four) hours as needed for wheezing or shortness of breath. 18 g 5  . aspirin 81 MG EC tablet Take 81 mg by mouth daily.      . budesonide-formoterol (SYMBICORT) 80-4.5 MCG/ACT inhaler Inhale 2 puffs into the lungs 2 (two) times daily. 1 Inhaler 12  . Calcium Carbonate-Vitamin D (CALCIUM 600+D) 600-400 MG-UNIT per tablet Take 1 tablet by mouth daily.      . chlorpheniramine (CHLORPHEN) 4 MG tablet Take 1 tab in AM and 2 tabs in PM    . dextromethorphan (DELSYM) 30 MG/5ML liquid Take 30 mg by mouth 2 (two) times daily as needed.     . diazepam (VALIUM) 2 MG tablet Take 1 tablet (2 mg total) by mouth at bedtime as needed for muscle spasms. 30 tablet 0  . gabapentin (NEURONTIN) 300 MG capsule Take 1 capsule (300 mg total) by mouth at bedtime. 30 capsule 5  . hydrochlorothiazide (HYDRODIURIL) 25 MG tablet Take 1  tablet (25 mg total) by mouth daily. 90 tablet 3  . ibuprofen (ADVIL,MOTRIN) 800 MG tablet Take 1 tablet (800 mg total) by mouth every 8 (eight) hours as needed. 30 tablet 0  . insulin NPH-regular Human (NOVOLIN 70/30 RELION) (70-30) 100 UNIT/ML injection Inject 75 Units into the skin 2 (two) times daily with a meal. 40 mL 0  . Insulin Syringe-Needle U-100 31G X 15/64" 1 ML MISC Use to give injections twice daily.  Dx code ICD-9 250.00. 100 each 1  . metFORMIN (GLUCOPHAGE) 1000 MG tablet Take 1 tablet (1,000 mg total) by mouth 2 (two) times daily with a meal. 180 tablet 3  . mometasone (NASONEX) 50 MCG/ACT nasal spray Place 2 sprays into the nose daily as needed.    . Multiple Vitamin (MULTIVITAMIN WITH MINERALS) TABS tablet Take 1 tablet by mouth daily.    Marland Kitchen oxyCODONE-acetaminophen (PERCOCET/ROXICET) 5-325 MG per tablet Take 1 tablet by mouth every 8 (eight) hours as needed. 90 tablet 0  . pantoprazole (PROTONIX) 40 MG tablet Take 40 mg by mouth daily.    . [DISCONTINUED] esomeprazole (NEXIUM) 20 MG capsule Take 1 capsule (20 mg total) by mouth daily. 90 capsule 3  . [DISCONTINUED] simvastatin (ZOCOR) 40 MG tablet TAKE ONE TABLET BY MOUTH EVERY DAY 60 tablet 6   No current facility-administered medications for this visit.  Review of Systems  Constitutional: Negative for activity change, appetite change and fatigue.  HENT: Negative.   Eyes: Negative.   Respiratory: Negative for shortness of breath and wheezing.   Cardiovascular: Negative for chest pain and palpitations.  Gastrointestinal: Negative for abdominal pain, diarrhea and constipation.  Genitourinary: Negative.   Musculoskeletal:       Has suffered from muscle cramps, but these are much better-controlled on valium.  Skin: Negative.        Objective:   Physical Exam  Constitutional: She appears well-developed and well-nourished. No distress.  HENT:  Head: Normocephalic and atraumatic.  Eyes: Conjunctivae and EOM are  normal. Pupils are equal, round, and reactive to light.  Neck: Normal range of motion.  Cardiovascular: Normal rate, regular rhythm and normal heart sounds.   No murmur heard. Pulmonary/Chest: Effort normal and breath sounds normal. She has no wheezes.  Abdominal: Soft. Bowel sounds are normal. There is no tenderness.  Vitals reviewed.      Assessment & Plan:  Please see problem oriented charting for assessment & plan  Venita Lick, MD

## 2014-08-28 NOTE — Patient Instructions (Signed)
Please visit the eye doctor - a referral has been sent  Please take your blood sugar reading before each meal, then before bed (4x per day).  Please return to meet with [name] about your diabetes management.  You got a flu vaccine on 02/27/2014  General Instructions:   Please bring your medicines with you each time you come to clinic.  Medicines may include prescription medications, over-the-counter medications, herbal remedies, eye drops, vitamins, or other pills.   Progress Toward Treatment Goals:  Treatment Goal 01/01/2014  Hemoglobin A1C unchanged  Blood pressure at goal    Self Care Goals & Plans:  Self Care Goal 08/28/2014  Manage my medications take my medicines as prescribed; bring my medications to every visit; refill my medications on time  Monitor my health bring my glucose meter and log to each visit; keep track of my blood glucose; keep track of my blood pressure; check my feet daily  Eat healthy foods eat foods that are low in salt; eat baked foods instead of fried foods; eat more vegetables  Be physically active find an activity I enjoy; take a walk every day  Meeting treatment goals -    Home Blood Glucose Monitoring 01/01/2014  Check my blood sugar 3 times a day  When to check my blood sugar before meals     Care Management & Community Referrals:  Referral 01/01/2014  Referrals made for care management support -  Referrals made to community resources none

## 2014-08-28 NOTE — Progress Notes (Signed)
Valium rx faxed to [facility] on [address].

## 2014-08-29 NOTE — Assessment & Plan Note (Signed)
Lower extremity cramps have greatly improved on valium. She also uses hot baths before bed. - continue current management - valium refilled

## 2014-08-29 NOTE — Assessment & Plan Note (Signed)
Lab Results  Component Value Date   HGBA1C 8.8 08/28/2014   HGBA1C 8.6 12/02/2013   HGBA1C 9.4 08/23/2013     Assessment: Diabetes control: stable   Progress toward A1C goal: not at goal    Comments: patient was on prednisone during the last 3 months  Plan: Medications:  continue current medications, may need increase in 70/30, but had several symptomatic lows (60s) recently. Home glucose monitoring: Frequency: before meals and before bed (4x per day); patient's readings were mostly in the morning   Timing:as above   Instruction/counseling given: reminded to get eye exam and given referral Other plans:  - Appointment with Butch Penny in 1 week; bring log (with 4 readings per day) Butch Penny to touch base with me (PCP) with any manipulations to current regimen - Could consider adding Januvia - Patient had asked about Invokana and Byetta; advised that studies are not complete yet on these new medications, and that they are also likely expensive. Patient to attempt weight loss as an alternative; told we could continue to discuss if she investigates cost with insurance and finds them to be doable

## 2014-08-29 NOTE — Assessment & Plan Note (Signed)
BP Readings from Last 3 Encounters:  08/28/14 138/67  02/27/14 140/67  02/13/14 124/72    Lab Results  Component Value Date   NA 140 12/02/2013   K 3.9 12/02/2013   CREATININE 0.75 12/02/2013    Assessment: Blood pressure control: good   Progress toward BP goal: near goal    Other: losartan was d/c on a prior visit due to cough  Plan: Medications:  continue current medications (HCTZ 25 mg daily)  - Patient was asking about why she wasn't on an ACE/ARB; she has been tried on both, and was taken off due to persistent cough - Continue to follow, consider BMET at next appointment

## 2014-08-30 NOTE — Addendum Note (Signed)
Addended by: Drucilla Schmidt E on: 08/30/2014 01:56 PM   Modules accepted: Orders, Medications

## 2014-09-01 NOTE — Progress Notes (Signed)
Internal Medicine Clinic Attending Date of Visit: 08/28/2014  Case discussed with Dr. Sherrine Maples soon after the resident saw the patient on the day of the visit.  We reviewed the resident's history and exam and pertinent patient test results.  I agree with the assessment, diagnosis, and plan of care documented in the resident's note.

## 2014-09-09 ENCOUNTER — Telehealth: Payer: Self-pay | Admitting: Internal Medicine

## 2014-09-09 NOTE — Telephone Encounter (Signed)
Spoke with pt and advised that no samples of Symbicort at this time.  Pt assistance forms were mailed to pt.

## 2014-09-11 ENCOUNTER — Ambulatory Visit (INDEPENDENT_AMBULATORY_CARE_PROVIDER_SITE_OTHER): Payer: Medicare Other | Admitting: Dietician

## 2014-09-11 ENCOUNTER — Encounter: Payer: Self-pay | Admitting: Dietician

## 2014-09-11 DIAGNOSIS — Z794 Long term (current) use of insulin: Secondary | ICD-10-CM

## 2014-09-11 DIAGNOSIS — E1140 Type 2 diabetes mellitus with diabetic neuropathy, unspecified: Secondary | ICD-10-CM

## 2014-09-11 DIAGNOSIS — Z713 Dietary counseling and surveillance: Secondary | ICD-10-CM | POA: Diagnosis not present

## 2014-09-11 DIAGNOSIS — E119 Type 2 diabetes mellitus without complications: Secondary | ICD-10-CM

## 2014-09-11 NOTE — Patient Instructions (Addendum)
I will talk to Dr. Sherrine Maples about you starting Januvia.  Please call us if you have blood sugars less than 70 or that you feel uncomfortable with.   You may want to download Map[employer]walk from the [employer] store  to use as a pedometer since you seemed to like them.  You are doing a great job with handling your diet and exercise and managing your diabetes

## 2014-09-11 NOTE — Progress Notes (Signed)
  Medical Nutrition Therapy:  Appt start time: 0815 end time:  0915. Last MNT visit 11/2013. Weight and a1C stable since then. Both are elevated above goals for this patient. A1C 8.8% with goal < 7%, weight 260#, goal 240# ~ 7% weight loss)  Assessment:  Primary concerns today: glycemic control.  CDE was asked to discuss adding new medicine (Januvia) to patient's diabetes regimen that currently consists of 75 units twice daily of ReliOn 70/30 insulin and metformin twice daily. Patient was educated about cost, side affects, dosing and lowering ability of A1C/blood sugars of DPP-4 inhibitors. Patient would like to try the Januvia, says she will call if it lowers her blood sugar too much. She has been making healthy changes with her boss as support at work. walking daily and making healthier choices. This has resulted in improved CBG, but not weight as yet.  Preferred Learning Style: No preference indicated  Learning Readiness: seems to be oscillating depending on support systems available: Contemplating/Ready/Change in progress Support: patient currently has coworker as support, she liked the idea of group medical visits with others with Type 2 diabetes MEDICATIONS: metformin, 75 units insulin twice daily MVI for 50+,  BLOOD SUGARS: her meter day and time were off before 08/28/14, but when rerun showed significant improvement from prior to 08/28/14, before 08/28/14 average 183, last 14 day average average is 152, this includes one CBG of 74. DIETARY INTAKE: Usual eating pattern includes 2-3 meals and 0-2 snacks per day. Everyday foods include used to eat fried foods often, starchy foods and eat out often.  Avoided foods include milk, regular soda   24-hr recall:  B ( AM): oatmeal, nuts, granola, water, coffee, apple L ( PM): greek yogurt, granola, nuts, dried fruit  Sometimes eats a carrot D ( PM): baked fish, french fries, water Snk ( PM): nuts Beverages: water, unsweetened tea, coffee  Usual physical  activity: takes stairs at work and just started walking 10-15 minutes 2x/week, likes using a pedometer, but lost hers  Estimated daily energy needs for 1-2 # weight loss/week: 1200-1600 calories ~ 1000-1100 calories from carbs and protein 85-90 g protein   Progress Towards Goal(s):  In progress./some progress with CBGs   Nutritional Diagnosis:  NB-1.1 Food and nutrition-related knowledge deficit As related to lack of knowledge about diabetes medicine action along with incorporating lifestyle change.  As evidenced by her questions, a1C >8% and increased weight.    Intervention:  Nutrition education and support about diabetes medicine, affect of exercise, healthy food choices, importance of support and when to adjust medications to promote weight loss along with lowered blood sugars. Coordination of care- send note to dr. Sherrine Maples that patient verbalized understanding of action, side effects and cost of DPP-4 inhibitors ( $40/month on her plan) and still wanted to start it. She did not think she needed an adjustment in her insulin, but CDE would encourage dropping her doses by at least ~ 5%-10% initially or more. (~10-15 units)  total because she is also making lifestyle adjustment concomitantly consider 65- units ac breakfast and 70 units ac dinner when starting DPP-4 inhibitor Teaching Method Utilized: Visual & Auditory Handouts given during visit include: insurance information and 2016 formulary with cost of tiers, picture handout about action of diabetes  Medicine, AVS Barriers to learning/adherence to lifestyle change: lack of sufficient support at times, competing values  Demonstrated degree of understanding via:  Teach Back   Monitoring/Evaluation:  Dietary intake, exercise, meter, and body weight prn.

## 2014-09-18 MED ORDER — SITAGLIPTIN PHOSPHATE 100 MG PO TABS: 100.0000 mg | ORAL_TABLET | Freq: Every day | ORAL | Status: DC

## 2014-09-18 NOTE — Progress Notes (Signed)
Her A1C is 8.8% and CBGs were better at her visit with me. So a 10-20% decrease seems reasonable, that would be 8-15 units. I suggest a decrease of 10 units to start (a 5 unit decrease of each dose for a new regimen of  70 units Novolin BID.

## 2014-09-26 ENCOUNTER — Other Ambulatory Visit: Payer: Self-pay | Admitting: *Deleted

## 2014-09-26 DIAGNOSIS — E1140 Type 2 diabetes mellitus with diabetic neuropathy, unspecified: Secondary | ICD-10-CM

## 2014-09-27 MED ORDER — INSULIN NPH ISOPHANE & REGULAR (70-30) 100 UNIT/ML ~~LOC~~ SUSP: 75.0000 [IU] | Freq: Two times a day (BID) | SUBCUTANEOUS | Status: DC

## 2014-10-07 ENCOUNTER — Telehealth: Payer: Self-pay | Admitting: Dietician

## 2014-10-10 NOTE — Telephone Encounter (Signed)
Patient answered and said she'd call back

## 2014-10-10 NOTE — Telephone Encounter (Signed)
Calling patient to follow up on her blood sugars since starting new diabetes medicine.

## 2014-10-13 ENCOUNTER — Other Ambulatory Visit: Payer: Self-pay | Admitting: Internal Medicine

## 2014-10-13 ENCOUNTER — Encounter: Payer: Self-pay | Admitting: *Deleted

## 2014-10-15 ENCOUNTER — Telehealth: Payer: Self-pay | Admitting: Dietician

## 2014-10-15 ENCOUNTER — Other Ambulatory Visit: Payer: Self-pay | Admitting: *Deleted

## 2014-10-15 DIAGNOSIS — I10 Essential (primary) hypertension: Secondary | ICD-10-CM

## 2014-10-15 DIAGNOSIS — M549 Dorsalgia, unspecified: Secondary | ICD-10-CM

## 2014-10-15 NOTE — Telephone Encounter (Signed)
[name] and I have been calling each other back and forth. We finally spoke today: she says the her diabetes regimen with [place] added  is going well- she is less hungry and her blood sugars are better: 83 twice in the afternoon when she has skipped a meal, most blood sugars in the 100s and only in 200 s a few times when she forget her insulin  She asks about refills on 1- pain medicine- the triage nurse has started a refill encounter for this Januvia- will be out this weekend and HCTZ - will discuss with triage nurse .  Also told [place] we'd let her know when she needs to make a follow up appointment- her a1C is due late June early July

## 2014-10-16 MED ORDER — OXYCODONE-ACETAMINOPHEN 5-325 MG PO TABS
1.0000 | ORAL_TABLET | Freq: Three times a day (TID) | ORAL | Status: DC | PRN
Start: 2014-10-16 — End: 2014-12-25

## 2014-10-16 MED ORDER — HYDROCHLOROTHIAZIDE 25 MG PO TABS: 25.0000 mg | ORAL_TABLET | Freq: Every day | ORAL | Status: DC

## 2014-10-17 ENCOUNTER — Other Ambulatory Visit: Payer: Self-pay | Admitting: *Deleted

## 2014-10-17 DIAGNOSIS — R252 Cramp and spasm: Secondary | ICD-10-CM

## 2014-10-17 NOTE — Telephone Encounter (Signed)
Called, no answer, lm for rtc 

## 2014-10-20 LAB — HM DIABETES EYE EXAM

## 2014-10-22 MED ORDER — DIAZEPAM 2 MG PO TABS
2.0000 mg | ORAL_TABLET | Freq: Every evening | ORAL | Status: DC | PRN
Start: 2014-10-22 — End: 2015-05-19

## 2014-10-22 NOTE — Telephone Encounter (Signed)
Rx called in 

## 2014-11-18 ENCOUNTER — Telehealth: Payer: Self-pay | Admitting: Dietician

## 2014-11-18 ENCOUNTER — Other Ambulatory Visit: Payer: Self-pay

## 2014-11-18 DIAGNOSIS — N63 Unspecified lump in unspecified breast: Secondary | ICD-10-CM

## 2014-11-18 NOTE — Telephone Encounter (Signed)
Confirmed that [name]'s last eye exam at Dr. Payton Emerald office was 2014. She is not a retinal camera candidate because she was a glaucoma suspect, so will need to follow up with an eye doctor.

## 2014-11-19 ENCOUNTER — Telehealth: Payer: Self-pay | Admitting: Internal Medicine

## 2014-11-19 NOTE — Telephone Encounter (Signed)
Call to patient to confirm appointment for 11/20/14 at 1:45 lmtcb

## 2014-11-20 ENCOUNTER — Encounter: Payer: Self-pay | Admitting: Internal Medicine

## 2014-11-20 ENCOUNTER — Ambulatory Visit (INDEPENDENT_AMBULATORY_CARE_PROVIDER_SITE_OTHER): Payer: Medicare Other | Admitting: Internal Medicine

## 2014-11-20 VITALS — BP 127/72 | HR 87 | Temp 98.2°F | Ht 64.0 in | Wt 260.2 lb

## 2014-11-20 DIAGNOSIS — Z794 Long term (current) use of insulin: Secondary | ICD-10-CM | POA: Diagnosis not present

## 2014-11-20 DIAGNOSIS — E1140 Type 2 diabetes mellitus with diabetic neuropathy, unspecified: Secondary | ICD-10-CM

## 2014-11-20 DIAGNOSIS — E785 Hyperlipidemia, unspecified: Secondary | ICD-10-CM | POA: Diagnosis not present

## 2014-11-20 DIAGNOSIS — Z87891 Personal history of nicotine dependence: Secondary | ICD-10-CM

## 2014-11-20 DIAGNOSIS — N63 Unspecified lump in unspecified breast: Secondary | ICD-10-CM

## 2014-11-20 DIAGNOSIS — I10 Essential (primary) hypertension: Secondary | ICD-10-CM | POA: Diagnosis not present

## 2014-11-20 LAB — POCT GLYCOSYLATED HEMOGLOBIN (HGB A1C): Hemoglobin A1C: 8

## 2014-11-20 LAB — GLUCOSE, CAPILLARY: Glucose-Capillary: 76 mg/dL (ref 65–99)

## 2014-11-20 MED ORDER — SITAGLIPTIN PHOSPHATE 100 MG PO TABS: ORAL_TABLET | ORAL | Status: DC

## 2014-11-20 NOTE — Progress Notes (Signed)
[facility] Subjective:   Patient ID: [name] female   DOB: Nov 25, 1960 54 y.o.   MRN: [id number]  HPI: [name] is a 54 y.o. female with a history of DM2 (last 8.8%, recently added Januvia therapy), hypertension, hyperlipidemia, obesity and chronic pain who presents for medication refills and general maintenance.   She has been compliant with her insulin 70/30, metformin and her newly added Januvia (added 10/2014), but her diet has fluctuated secondary to some deaths in her family and some resulting stressors. The cramping she was previously experiencing has been much better-controlled on valium.   Past Medical History  Diagnosis Date  . Chronic back pain   . Hyperlipidemia   . Hypertension   . Uterine fibroid     s/p hysterectomy  . Cocaine abuse     in remission  . Tobacco abuse   . Chronic leg pain     due to back pain  . Diabetes     with neuropathy  . RECTAL BLEEDING 12/09/2008    Annotation: s/p EGD 7/08 mild gastritis, s/p colonoscopy 7/08- benign polyp  s/p polypectomy and isolated diverticulum.  Qualifier: Diagnosis of  By: Ditzler RN, Debra    . Sleep apnea    Current Outpatient Prescriptions  Medication Sig Dispense Refill  . acyclovir (ZOVIRAX) 400 MG tablet Take 1 tablet (400 mg total) by mouth 2 (two) times daily. 60 tablet 3  . albuterol (PROVENTIL HFA;VENTOLIN HFA) 108 (90 BASE) MCG/ACT inhaler Inhale 2 puffs into the lungs every 4 (four) hours as needed for wheezing or shortness of breath. 18 g 5  . aspirin 81 MG EC tablet Take 81 mg by mouth daily.      . budesonide-formoterol (SYMBICORT) 80-4.5 MCG/ACT inhaler Inhale 2 puffs into the lungs 2 (two) times daily. 1 Inhaler 12  . Calcium Carbonate-Vitamin D (CALCIUM 600+D) 600-400 MG-UNIT per tablet Take 1 tablet by mouth daily.      . chlorpheniramine (CHLORPHEN) 4 MG tablet Take 1 tab in AM and 2 tabs in PM    . dextromethorphan (DELSYM) 30 MG/5ML liquid Take 30 mg by mouth 2 (two)  times daily as needed.     . diazepam (VALIUM) 2 MG tablet Take 1 tablet (2 mg total) by mouth at bedtime as needed for muscle spasms. 30 tablet 0  . gabapentin (NEURONTIN) 300 MG capsule Take 1 capsule (300 mg total) by mouth at bedtime. 30 capsule 5  . hydrochlorothiazide (HYDRODIURIL) 25 MG tablet Take 1 tablet (25 mg total) by mouth daily. 90 tablet 3  . ibuprofen (ADVIL,MOTRIN) 800 MG tablet Take 1 tablet (800 mg total) by mouth every 8 (eight) hours as needed. 30 tablet 0  . insulin NPH-regular Human (NOVOLIN 70/30 RELION) (70-30) 100 UNIT/ML injection Inject 75 Units into the skin 2 (two) times daily with a meal. 120 mL 1  . Insulin Syringe-Needle U-100 31G X 15/64" 1 ML MISC Use to give injections twice daily.  Dx code ICD-9 250.00. 100 each 1  . metFORMIN (GLUCOPHAGE) 1000 MG tablet Take 1 tablet (1,000 mg total) by mouth 2 (two) times daily with a meal. 180 tablet 3  . mometasone (NASONEX) 50 MCG/ACT nasal spray Place 2 sprays into the nose daily as needed.    . Multiple Vitamin (MULTIVITAMIN WITH MINERALS) TABS tablet Take 1 tablet by mouth daily.    Marland Kitchen oxyCODONE-acetaminophen (PERCOCET/ROXICET) 5-325 MG per tablet Take 1 tablet by mouth every 8 (eight) hours as needed. 90 tablet 0  .  pantoprazole (PROTONIX) 40 MG tablet Take 40 mg by mouth daily.    . sitaGLIPtin (JANUVIA) 100 MG tablet TAKE ONE TABLET BY MOUTH  DAILY 30 tablet 0  . [DISCONTINUED] esomeprazole (NEXIUM) 20 MG capsule Take 1 capsule (20 mg total) by mouth daily. 90 capsule 3  . [DISCONTINUED] simvastatin (ZOCOR) 40 MG tablet TAKE ONE TABLET BY MOUTH EVERY DAY 60 tablet 6   No current facility-administered medications for this visit.   Family History  Problem Relation Age of Onset  . Diabetes Father   . Heart disease Father   . Hypertension Father   . Diabetes Mother   . Cancer Mother     brain  . Hypertension Mother    History   Social History  . Marital Status: Married    Spouse Name: N/A  . Number of  Children: N/A  . 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    Social History Main Topics  . Smoking status: Former Smoker -- 0.50 packs/day for 20 years    Types: Cigarettes    Quit date: 07/23/1998  . Smokeless tobacco: Never Used  . Alcohol Use: No     Comment: recovering addict clean for 5 years  . Drug Use: No     Comment: recovering addict clean for 5 years  . Sexual Activity: Not on file   Other Topics Concern  . None   Social History Narrative   Unemployed but has started taking community college classes (interested in accounting) since having back surgery, which has improved pain and mobility. Hopes to return to work soon. Lives with husband and daughter.  Uninsured.   Review of Systems: General: no recent illness, several recent stressors (lost her uncle and her father-in-law) Skin: no rashes or lesions HEENT: no headaches or blurred vision Cardiac: no chest pain or palpitations Respiratory: no shortness of breath GI: no changes Urinary: no changes Msk: no joint pain; occasional leg swelling which is relieved by propping her legs up Psychiatric: recent stressors  Objective:  Physical Exam: Filed Vitals:   11/20/14 1408 11/20/14 1448  BP: 149/71 127/72  Pulse: 93 87  Temp: 98.2 F (36.8 C)   TempSrc: Oral   Height: 5\' 4"  (1.626 m)   Weight: 260 lb 3.2 oz (118.026 kg)   SpO2: 98%    Appearance: in NAD HEENT: AT/[place], PERRL, EOMi Heart: RRR, normal S1S2 Lungs: CTAB, normal work of breathing Abdomen: BS+, soft, nontender Musculoskeletal: normal range of motion, no edema BLE Neurologic: A&Ox3, grossly intact Skin: no rashes or lesions  Assessment & Plan:  Case discussed with Dr. Beryle Beams  Essential hypertension BP Readings from Last 3 Encounters:  11/20/14 127/72  08/28/14 138/67  02/27/14 140/67    Lab Results  Component Value Date   NA 140 12/02/2013   K 3.9 12/02/2013   CREATININE 0.75 12/02/2013     Assessment: Blood pressure control: good   Progress toward BP goal: at goal    Plan: Medications:  continue current medications; HCTZ 25 mg daily Educational resources provided: brochure Self management tools provided:   Other plans:  - BMET today - Continue to monitor   HYPERLIPIDEMIA Patient's pravastatin was stopped when she was experiencing muscle cramping earlier this year. Stopping this medication did not affect her cramping. Valium has relieved her cramping. She was never restarted on her statin. Over the same time, her diet has improved and exercise, increased.  - Lipid panel today; may need medical therapy  Type 2 diabetes mellitus with diabetic neuropathy Lab Results  Component Value Date   HGBA1C 8.0 11/20/2014   HGBA1C 8.8 08/28/2014   HGBA1C 8.6 12/02/2013     Assessment: Diabetes control: improving!     Comments: Patient has made dietary and exercise improvements, and also elected to start Januvia in 10/2014.  Plan: Medications:  continue current medications ; metformin 1000 BID, Januvia 100 mg daily and Novolin 70/30 75 U BID with meals.  Instruction/counseling given: discussed diet Educational resources provided: brochure Self management tools provided: copy of home glucose meter download      Medications Ordered Meds ordered this encounter  Medications  . sitaGLIPtin (JANUVIA) 100 MG tablet    Sig: TAKE ONE TABLET BY MOUTH  DAILY    Dispense:  30 tablet    Refill:  0   Other Orders Orders Placed This Encounter  Procedures  . US BREAST LTD UNI RIGHT INC AXILLA    Standing Status: Future     Number of Occurrences:      Standing Expiration Date: 01/20/2016    Order Specific Question:  Reason for Exam (SYMPTOM  OR DIAGNOSIS REQUIRED)    Answer:  breast mass    Order Specific Question:  Preferred imaging location?    Answer:  [facility]  . US BREAST LTD UNI LEFT INC AXILLA    Standing Status: Future     Number of Occurrences:       Standing Expiration Date: 01/20/2016    Order Specific Question:  Reason for Exam (SYMPTOM  OR DIAGNOSIS REQUIRED)    Answer:  breast mass    Order Specific Question:  Preferred imaging location?    Answer:  [facility]  . Glucose, capillary  . Basic metabolic panel  . Lipid Profile  . Ambulatory referral to Ophthalmology    Referral Priority:  Routine    Referral Type:  Consultation    Referral Reason:  Specialty Services Required    Requested Specialty:  Ophthalmology    Number of Visits Requested:  1  . POCT HgB A1C

## 2014-11-20 NOTE — Assessment & Plan Note (Signed)
Lab Results  Component Value Date   HGBA1C 8.0 11/20/2014   HGBA1C 8.8 08/28/2014   HGBA1C 8.6 12/02/2013     Assessment: Diabetes control: improving!     Comments: Patient has made dietary and exercise improvements, and also elected to start Januvia in 10/2014.  Plan: Medications:  continue current medications ; metformin 1000 BID, Januvia 100 mg daily and Novolin 70/30 75 U BID with meals.  Instruction/counseling given: discussed diet Educational resources provided: brochure Self management tools provided: copy of home glucose meter download

## 2014-11-20 NOTE — Assessment & Plan Note (Addendum)
BP Readings from Last 3 Encounters:  11/20/14 127/72  08/28/14 138/67  02/27/14 140/67    Lab Results  Component Value Date   NA 140 12/02/2013   K 3.9 12/02/2013   CREATININE 0.75 12/02/2013    Assessment: Blood pressure control: good   Progress toward BP goal: at goal    Plan: Medications:  continue current medications; HCTZ 25 mg daily Educational resources provided: brochure Self management tools provided:   Other plans:  - BMET today - Continue to monitor  ADDENDUM: Bmet significant for K of 3.3. Called patient; will prescribe potassium supplement

## 2014-11-20 NOTE — Patient Instructions (Signed)
Please continue the great work with your diabetes!  Your A1c was 8.0% today.  I will call you with any lab abnormalities or changes in your medications. We may need to add back a cholesterol medication.  General Instructions:   Thank you for bringing your medicines today. This helps [place] keep you safe from mistakes.   Progress Toward Treatment Goals:  Treatment Goal 01/01/2014  Hemoglobin A1C unchanged  Blood pressure at goal    Self Care Goals & Plans:  Self Care Goal 11/20/2014  Manage my medications take my medicines as prescribed; bring my medications to every visit; refill my medications on time  Monitor my health keep track of my blood glucose; bring my glucose meter and log to each visit; keep track of my blood pressure; check my feet daily  Eat healthy foods eat foods that are low in salt; eat baked foods instead of fried foods; eat more vegetables  Be physically active find an activity I enjoy; take a walk every day  Meeting treatment goals -    Home Blood Glucose Monitoring 01/01/2014  Check my blood sugar 3 times a day  When to check my blood sugar before meals     Care Management & Community Referrals:  Referral 01/01/2014  Referrals made for care management support -  Referrals made to community resources none

## 2014-11-20 NOTE — Progress Notes (Signed)
Medicine attending: Medical history, presenting problems, physical findings, and medications, reviewed with Dr Julia Mallory and I concur with her evaluation and management plan. 

## 2014-11-20 NOTE — Assessment & Plan Note (Addendum)
Patient's pravastatin was stopped when she was experiencing muscle cramping earlier this year. Stopping this medication did not affect her cramping. Valium has relieved her cramping. She was never restarted on her statin. Over the same time, her diet has improved and exercise, increased.  - Lipid panel today; may need medical therapy   ADDENDUM: Patient's lipid panel is suggestive of an ASCVD 10-year risk of 33.7%. She needs a high-intensity statin. Since her cramping continued off of her statin, this will be re-prescribed today.

## 2014-11-21 ENCOUNTER — Telehealth: Payer: Self-pay | Admitting: Internal Medicine

## 2014-11-21 LAB — LIPID PANEL
CHOLESTEROL: 239 mg/dL — AB (ref 0–200)
HDL: 32 mg/dL — AB (ref >=46)
LDL Cholesterol: 175 mg/dL — ABNORMAL HIGH (ref 0–99)
TRIGLYCERIDES: 158 mg/dL — AB (ref <150)
Total CHOL/HDL Ratio: 7.5 Ratio
VLDL: 32 mg/dL (ref 0–40)

## 2014-11-21 LAB — BASIC METABOLIC PANEL
BUN: 7 mg/dL (ref 6–23)
CHLORIDE: 99 meq/L (ref 96–112)
CO2: 29 mEq/L (ref 19–32)
CREATININE: 0.66 mg/dL (ref 0.50–1.10)
Calcium: 9.5 mg/dL (ref 8.4–10.5)
Glucose, Bld: 138 mg/dL — ABNORMAL HIGH (ref 70–99)
Potassium: 3.3 mEq/L — ABNORMAL LOW (ref 3.5–5.3)
Sodium: 140 mEq/L (ref 135–145)

## 2014-11-21 MED ORDER — ROSUVASTATIN CALCIUM 20 MG PO TABS: 20.0000 mg | ORAL_TABLET | Freq: Every day | ORAL | Status: DC

## 2014-11-21 MED ORDER — POTASSIUM CHLORIDE ER 10 MEQ PO TBCR: 10.0000 meq | EXTENDED_RELEASE_TABLET | Freq: Every day | ORAL | Status: DC

## 2014-11-21 NOTE — Progress Notes (Signed)
Patient called in to clinic to discuss some "indigestion" she felt after eating lunch today. She thinks it may be related to her lunch of fried chicken and cole slaw. Patient has taken protonix and ranitidine about a year ago for GERD. She has not had any similar symptoms in the past to what she feels today. She would like to try something over the counter before she adds any medications to her regimen.  - Patient to pick up Tums from the pharmacy - Patient will call into clinic next week if her symptoms continue to bother her  Venita Lick, MD PGY1

## 2014-11-21 NOTE — Addendum Note (Signed)
Addended by: Drucilla Schmidt E on: 11/21/2014 03:24 PM   Modules accepted: Orders

## 2014-11-25 ENCOUNTER — Telehealth: Payer: Self-pay | Admitting: *Deleted

## 2014-11-25 ENCOUNTER — Other Ambulatory Visit: Payer: Self-pay | Admitting: Internal Medicine

## 2014-11-25 DIAGNOSIS — G4733 Obstructive sleep apnea (adult) (pediatric): Secondary | ICD-10-CM

## 2014-11-25 DIAGNOSIS — N63 Unspecified lump in unspecified breast: Secondary | ICD-10-CM

## 2014-11-25 DIAGNOSIS — Z9989 Dependence on other enabling machines and devices: Principal | ICD-10-CM

## 2014-11-25 NOTE — Telephone Encounter (Signed)
Pt states she needs to have a new Sleep Study done; [facility] needs new order to provide her with supplies. Her previous company no longer provide service in [place]. States her last sleep study was done 4-5 years ago.Thanks

## 2014-11-26 ENCOUNTER — Ambulatory Visit
Admission: RE | Admit: 2014-11-26 | Discharge: 2014-11-26 | Disposition: A | Discharge status: Home / Self Care | Payer: Medicare Other | Source: Ambulatory Visit

## 2014-11-26 ENCOUNTER — Ambulatory Visit
Admission: RE | Admit: 2014-11-26 | Discharge: 2014-11-26 | Disposition: A | Discharge status: Home / Self Care | Payer: Medicare Other | Source: Ambulatory Visit | Attending: Internal Medicine | Admitting: Internal Medicine

## 2014-11-26 DIAGNOSIS — N63 Unspecified lump in unspecified breast: Secondary | ICD-10-CM

## 2014-12-03 NOTE — Telephone Encounter (Signed)
Sleep Study was ordered and has been scheduled per EPIC.

## 2014-12-04 ENCOUNTER — Encounter: Payer: Self-pay | Admitting: *Deleted

## 2014-12-08 ENCOUNTER — Other Ambulatory Visit: Payer: Self-pay

## 2014-12-17 ENCOUNTER — Telehealth: Payer: Self-pay | Admitting: Dietician

## 2014-12-17 DIAGNOSIS — E1140 Type 2 diabetes mellitus with diabetic neuropathy, unspecified: Secondary | ICD-10-CM

## 2014-12-17 NOTE — Telephone Encounter (Addendum)
Called about price of her insulin, she is paying cash for 5 vials and wants a prescription for insulin on her formulary- Humulin 70/30 kwikpens ( needs 3 boxes/month) sent to  [facility] at [facility]. Also, Needs refill on januvia and generic for crestor if possible.     Thinks her blood sugars are higher since she was seen here in June. Wonders if her insulin needs to be increased? Has been taking PM insulin after dinner. Advised her PM insulin  to 30 minutes before dinner first to see if this helps.  Her recent blood sugars are:  7/6 718 am 245 7/5 732 am 265 7/5 121 PM 151 7/4  203 Pm 237 7/4 857 am 248 7/3 955 am 176 7/3 437 pm 198 7/2 927 pm 161 7/2 917 am 247 7/1 842 am 173 7/1 505 pm 107 6/29 1014 pm 301  Wants to know why she is not on an ACE or ARB. Discussed with her that Losartan and Lisinopril are both noted in her chart to give her a cough. Advised her to discuss this with her physcian. She also asks about different insulins that she would not have to take as often and would help her blood sugars improve.   Appointment scheduled  With CDE for diabetes education and medical nutrition therapy.

## 2014-12-18 ENCOUNTER — Telehealth: Payer: Self-pay | Admitting: Dietician

## 2014-12-18 DIAGNOSIS — E1140 Type 2 diabetes mellitus with diabetic neuropathy, unspecified: Secondary | ICD-10-CM

## 2014-12-18 MED ORDER — ROSUVASTATIN CALCIUM 20 MG PO TABS: 20.0000 mg | ORAL_TABLET | Freq: Every day | ORAL | Status: DC

## 2014-12-18 MED ORDER — INSULIN ISOPHANE & REGULAR (HUMAN 70-30)100 UNIT/ML KWIKPEN: 75.0000 [IU] | PEN_INJECTOR | Freq: Two times a day (BID) | SUBCUTANEOUS | Status: DC

## 2014-12-18 MED ORDER — SITAGLIPTIN PHOSPHATE 100 MG PO TABS: ORAL_TABLET | ORAL | Status: DC

## 2014-12-18 NOTE — Telephone Encounter (Signed)
Called patient per her request to let her know the refills were sent in. Noted that she will also need pen needles to take her insulin using insulin pens.

## 2014-12-18 NOTE — Telephone Encounter (Signed)
Changed insulin as requested.  She was given same dose of Humulin as previously prescribed Novolin. Januvia refilled.  I requested generic equivalent for Crestor.   Thanks!  If crestor not available generic, she may need to change to another high intensity statin.  Will defer to her PCP.    EBM

## 2014-12-19 MED ORDER — INSULIN PEN NEEDLE 31G X 5 MM MISC: Status: DC

## 2014-12-25 ENCOUNTER — Other Ambulatory Visit: Payer: Self-pay | Admitting: Internal Medicine

## 2014-12-25 ENCOUNTER — Other Ambulatory Visit: Payer: Self-pay | Admitting: *Deleted

## 2014-12-25 ENCOUNTER — Telehealth: Payer: Self-pay | Admitting: Dietician

## 2014-12-25 DIAGNOSIS — M549 Dorsalgia, unspecified: Secondary | ICD-10-CM

## 2014-12-25 DIAGNOSIS — F1120 Opioid dependence, uncomplicated: Secondary | ICD-10-CM

## 2014-12-25 MED ORDER — OXYCODONE-ACETAMINOPHEN 5-325 MG PO TABS: 1.0000 | ORAL_TABLET | Freq: Three times a day (TID) | ORAL | Status: DC | PRN

## 2014-12-25 NOTE — Telephone Encounter (Signed)
Has appt 9/6 new PCP No UDs since 2011 Database - hydrocodone 5 #20 from Dr Caralyn Guile on Jan 29. Violation Documentation in chart lacking to support need for chronic opioids. Pls document last dose and check UDS Not getting med Q 30 days

## 2014-12-25 NOTE — Telephone Encounter (Signed)
CSW confirming pt's BC/BS Medicare eligibility, EMR states elapsed.  Once insurance is confirmed, CSW will explore resources with Ms. Sachdeva.

## 2014-12-25 NOTE — Telephone Encounter (Signed)
Pt informed [name] will pick up Reminded that she must keep 9/6 appt

## 2014-12-25 NOTE — Telephone Encounter (Signed)
Last filled at [facility] 10/24/2014 Next appt 02/17/2015

## 2014-12-25 NOTE — Telephone Encounter (Signed)
[employer] customer service having technical difficulties and requesting one hour to address.  CSW placed call to [facility], pharmacy rep was present when [name] picked up her medication.  Pt is showing as active, pt paid cash for Relion $124.  Today, pharmacy shows pt's copayment for Januvia $40.  CSW placed call to [name].  CSW left message requesting return call. CSW provided contact hours and phone number.

## 2014-12-25 NOTE — Telephone Encounter (Signed)
When she went to pick up her medications her insulin is over $200, januvia is over $200, is in the doughnut hole Got [facility] insulin, crestor and potassium, not [place]. Asking about  Her rx for percocet, she left a message on the triage voicemail .  Told her I would request assist from our social worker and pharmacist for her medications and the triage nurse for her pain medications and someone would be in touch with her.

## 2014-12-26 ENCOUNTER — Telehealth: Payer: Self-pay | Admitting: Licensed Clinical Social Worker

## 2014-12-26 NOTE — Telephone Encounter (Signed)
Pt having difficulty affording Januvia at this time, possible in donut -hole.  However, on 12/25/14 [facility]  pharmacy indicated pt's copayment $40 for Januvia.  CSW placed called to pt.  CSW left message requesting return call. CSW provided contact hours and phone number.

## 2014-12-29 ENCOUNTER — Encounter: Payer: Self-pay | Admitting: Licensed Clinical Social Worker

## 2014-12-29 NOTE — Telephone Encounter (Signed)
CSW returned call to [name].  Pt states [facility] provided her with incorrect cost for Januvia.  She has tried several times to pick up but the Januvia has not been in-stock.  Januvia cost $40, pt anticipates that after to purchases this medication she will be in her donut hole.  CSW discussed the MAP for Medicare patients in the donut hole and documentation pt will need to compile for application.  Pt aware CSW will mail out information, address confirmed.

## 2015-01-09 ENCOUNTER — Ambulatory Visit (INDEPENDENT_AMBULATORY_CARE_PROVIDER_SITE_OTHER): Payer: Medicare Other | Admitting: Dietician

## 2015-01-09 VITALS — Ht 62.0 in | Wt 263.0 lb

## 2015-01-09 DIAGNOSIS — E1140 Type 2 diabetes mellitus with diabetic neuropathy, unspecified: Secondary | ICD-10-CM

## 2015-01-09 DIAGNOSIS — Z713 Dietary counseling and surveillance: Secondary | ICD-10-CM

## 2015-01-09 DIAGNOSIS — E1165 Type 2 diabetes mellitus with hyperglycemia: Secondary | ICD-10-CM | POA: Diagnosis not present

## 2015-01-09 NOTE — Progress Notes (Signed)
Medical Nutrition Therapy:  Appt start time: 0930 end time:  [id number]. Last MNT visit 09/11/14.  Weight stable and a1C improved since then. Both are elevated above goals for this patient: A1C 8% with goal < 7%, weight 263#, goal 240# ~ 7% weight loss   Assessment:  Primary concerns today: glycemic control.  Patient is very concerned about her blood sugars as her sister recently had a stroke and she doesn't want that to happen to her. She takes her medication without fail and consistently, but she does not eat consistently there fore ends up eating 1-4 hours after her am injection and always takes her Pm injection after eating. She thinks Januvia has improved her blood sugars, but is still not happy with them. She continues to work on making healthy changes with her boss as support at work. And reports her husband is supportive as well most of the time. She  Walked every day despite back pain and plans to try stretching for her back pain.   Preferred Learning Style: No preference indicated  Learning Readiness: seems to be seeking out support systems: Contemplating/Ready/Change in progress Support: patient currently has coworker as support MEDICATIONS: metformin 1000 mg twice a day, 75 units insulin twice daily and not timed well with meals,  BLOOD SUGARS: average is now 195 which is higher than in past, she had two lows with symptoms midday after taking insulin and not eating or eating enough. She checks > 3x/day,. Pattern is higher in Pm but at least 50% fasting also high. They appear to be improved over the past week when she has been walking with all in the 100s and 200s. Marland Kitchen  DIETARY INTAKE: Usual eating pattern includes 2-3 meals and 0-2 snacks per day. Everyday foods include used to eat fried foods often, starchy foods and eat out often.  Avoided foods include milk, regular soda   24-hr recall:  B ( sometimes skips, sometimes early and some times later am): oatmeal, greek yogurt,  nuts, granola,  water, coffee, apple Lunch [employer] snack greek yogurt,  D (After 7 PM most days): baked fish, french fries, water Snk ( PM): nuts Beverages: water, unsweetened tea, coffee  Usual physical activity: takes stairs at work and  started walking 10-15 minutes 2x/week  Estimated daily energy needs for 1-2 # weight loss/week: 1200-1600 calories ~ 1000-1100 calories from carbs and protein 85-90 g protein   Progress Towards Goal(s):  In progress   Nutritional Diagnosis:  NB-1.1 Food and nutrition-related knowledge deficit As related to lack of knowledge about diabetes medicine action along with incorporating lifestyle change slightlty imrpoved As evidenced by her questions, her requesting assistance with decreasing a1C >8%.    Intervention:  Nutrition education and support about diabetes medicine, affect of exercise, healthy food choices, importance of support and when to adjust medications to promote weight loss along with lowered blood sugars. Coordination of care- patient's insulin regimen is not fitting with her lifestyle and needs, she likely does not need that much insulin if it were timed better,  She is going to try to work on timing her insulin properly with her food. if that does not work to achieve CBGs at goal then would consider alternative insulin or diabetes medicine. Consider decreasing her doses to 70 units twice daily ( 1.2 units/kg) in light of two recent low blood sugars Teaching Method Utilized: Visual & Auditory Handouts given during visit include:  AVS Barriers to learning/adherence to lifestyle change: lack of sufficient support at times, competing  values  Demonstrated degree of understanding via:  Teach Back   Monitoring/Evaluation:  Dietary intake, exercise, meter, and body weight in 4 week(s) follow up on phone next week

## 2015-01-09 NOTE — Progress Notes (Signed)
Ran a 12 week standard day report before  And after [place] was started: Her % in target increased from 8 to 22 and her average glucose fell from 211 to 184.

## 2015-01-09 NOTE — Patient Instructions (Addendum)
Decision s to improve your blood sugar:   1- you are going to take your insulin 30-40 minutes before you eat  To help you do this you are going [place].  Try to eat before 8 Pm at night  Making progress is good, we don't need to be perfect when trying to change a habit!! :)   Eulala will call you next week (Friday) to se how you are doing.  Gudelia will re look at your formulary before your next appointment

## 2015-01-16 ENCOUNTER — Telehealth: Payer: Self-pay | Admitting: Dietician

## 2015-01-16 NOTE — Telephone Encounter (Signed)
Patient returned call. She has been successful at taking her PM insulin before her evening meal "All this week" and has noticed her sugars are better. She has had  2 in the 200s, the rest below the 200s. Her plan is to continue to take the PM insulin before her Pm m,eal and try to start walking before her next appointment with RD, CDE.

## 2015-01-16 NOTE — Telephone Encounter (Signed)
Calling patient to follow up on how she is doing with taking insulin before her dinner and how this change is affecting her blood sugars:

## 2015-01-27 NOTE — Telephone Encounter (Signed)
Thank you for the information Alisandra.

## 2015-02-01 ENCOUNTER — Ambulatory Visit (HOSPITAL_BASED_OUTPATIENT_CLINIC_OR_DEPARTMENT_OTHER): Payer: Medicare Other | Attending: Internal Medicine | Admitting: Radiology

## 2015-02-01 VITALS — Ht 64.0 in | Wt 260.0 lb

## 2015-02-01 DIAGNOSIS — G4719 Other hypersomnia: Secondary | ICD-10-CM | POA: Diagnosis not present

## 2015-02-01 DIAGNOSIS — I10 Essential (primary) hypertension: Secondary | ICD-10-CM | POA: Diagnosis not present

## 2015-02-01 DIAGNOSIS — R5383 Other fatigue: Secondary | ICD-10-CM | POA: Insufficient documentation

## 2015-02-01 DIAGNOSIS — G4733 Obstructive sleep apnea (adult) (pediatric): Secondary | ICD-10-CM | POA: Diagnosis present

## 2015-02-01 DIAGNOSIS — R0683 Snoring: Secondary | ICD-10-CM | POA: Insufficient documentation

## 2015-02-01 DIAGNOSIS — Z9989 Dependence on other enabling machines and devices: Secondary | ICD-10-CM

## 2015-02-07 DIAGNOSIS — G4733 Obstructive sleep apnea (adult) (pediatric): Secondary | ICD-10-CM | POA: Diagnosis not present

## 2015-02-07 NOTE — Progress Notes (Signed)
  Patient Name: [name], Deliz Date: 02/01/2015 Gender: Female D.O.B: 1961-05-01 Age (years): 54 Referring Provider: Bartholomew Crews Height (inches): 72 Interpreting Physician: Baird Lyons MD, ABSM Weight (lbs): 260 RPSGT: Carolin Coy BMI: 71 MRN: [id number] Neck Size: 16.00 CLINICAL INFORMATION Sleep Study Type: NPSG     Indication for sleep study: Diabetes, Excessive Daytime Sleepiness, Fatigue, Hypertension, Obesity, OSA, Snoring     Epworth Sleepiness Score: 15     SLEEP STUDY TECHNIQUE As per the AASM Manual for the Scoring of Sleep and Associated Events v2.3 (April 2016) with a hypopnea requiring 4% desaturations.  The channels recorded and monitored were frontal, central and occipital EEG, electrooculogram (EOG), submentalis EMG (chin), nasal and oral airflow, thoracic and abdominal wall motion, anterior tibialis EMG, snore microphone, electrocardiogram, and pulse oximetry.  MEDICATIONS Patient's medications include: Charted for review. Medications self-administered by patient during sleep study : No sleep medicine administered.  SLEEP ARCHITECTURE The study was initiated at 10:58:00 PM and ended at 5:08:16 AM.  Sleep onset time was 10.9 minutes and the sleep efficiency was 92.3%. The total sleep time was 341.8 minutes.  Stage REM latency was 145.0 minutes.  The patient spent 10.09% of the night in stage N1 sleep, 76.30% in stage N2 sleep, 0.00% in stage N3 and 13.60% in REM.  Alpha intrusion was absent.  Supine sleep was 19.31%.  Awake after sleep onset 17.5 minutes  RESPIRATORY PARAMETERS The overall apnea/hypopnea index (AHI) was 4.9 per hour. There were 1 total apneas, including 0 obstructive, 1 central and 0 mixed apneas. There were 27 hypopneas and 23 RERAs. The upper limit if normal is an AHI 5/ hr.  The AHI during Stage REM sleep was 6.5 per hour.  AHI while supine was 3.6 per hour.  The mean oxygen saturation was 94.65%. The  minimum SpO2 during sleep was 88.00%.  Moderate snoring was noted during this study.  CARDIAC DATA The 2 lead EKG demonstrated sinus rhythm. The mean heart rate was 71.55 beats per minute. Other EKG findings include: None.  LEG MOVEMENT DATA The total PLMS were 16 with a resulting PLMS index of 2.81. Associated arousal with leg movement index was 0.2 .  IMPRESSIONS No significant obstructive sleep apnea occurred during this study (AHI = 4.9/h). No significant central sleep apnea occurred during this study (CAI = 0.2/h). The patient had minimal or no oxygen desaturation during the study (Min O2 = 88.00%) The patient snored with Moderate snoring volume. No cardiac abnormalities were noted during this study. Clinically significant periodic limb movements did not occur during sleep. No significant associated arousals.   DIAGNOSIS Primary Snoring (786.09 [R06.83 ICD-10]) Normal study  RECOMMENDATIONS Avoid alcohol, sedatives and other CNS depressants that may worsen sleep apnea and disrupt normal sleep architecture. Sleep hygiene should be reviewed to assess factors that may improve sleep quality. Weight management and regular exercise should be initiated or continued if appropriate.  Deneise Lever Diplomate, American Board of Sleep Medicine  ELECTRONICALLY SIGNED ON:  02/07/2015, 11:55 AM [place] PH: [phone]   FX: [phone] [place]

## 2015-02-17 ENCOUNTER — Encounter: Payer: Self-pay | Admitting: Dietician

## 2015-02-17 ENCOUNTER — Encounter: Payer: Self-pay | Admitting: Internal Medicine

## 2015-02-17 ENCOUNTER — Ambulatory Visit (INDEPENDENT_AMBULATORY_CARE_PROVIDER_SITE_OTHER): Payer: Medicare Other | Admitting: Internal Medicine

## 2015-02-17 ENCOUNTER — Ambulatory Visit (INDEPENDENT_AMBULATORY_CARE_PROVIDER_SITE_OTHER): Payer: Medicare Other | Admitting: Dietician

## 2015-02-17 VITALS — BP 122/68 | HR 92 | Temp 98.0°F | Ht 64.0 in | Wt 261.0 lb

## 2015-02-17 DIAGNOSIS — Z79899 Other long term (current) drug therapy: Secondary | ICD-10-CM

## 2015-02-17 DIAGNOSIS — I10 Essential (primary) hypertension: Secondary | ICD-10-CM

## 2015-02-17 DIAGNOSIS — E1140 Type 2 diabetes mellitus with diabetic neuropathy, unspecified: Secondary | ICD-10-CM | POA: Diagnosis not present

## 2015-02-17 DIAGNOSIS — D1722 Benign lipomatous neoplasm of skin and subcutaneous tissue of left arm: Secondary | ICD-10-CM

## 2015-02-17 DIAGNOSIS — Z794 Long term (current) use of insulin: Secondary | ICD-10-CM | POA: Diagnosis not present

## 2015-02-17 DIAGNOSIS — E1165 Type 2 diabetes mellitus with hyperglycemia: Secondary | ICD-10-CM

## 2015-02-17 DIAGNOSIS — Z713 Dietary counseling and surveillance: Secondary | ICD-10-CM

## 2015-02-17 DIAGNOSIS — M549 Dorsalgia, unspecified: Secondary | ICD-10-CM

## 2015-02-17 DIAGNOSIS — D1721 Benign lipomatous neoplasm of skin and subcutaneous tissue of right arm: Secondary | ICD-10-CM

## 2015-02-17 DIAGNOSIS — D1723 Benign lipomatous neoplasm of skin and subcutaneous tissue of right leg: Secondary | ICD-10-CM | POA: Diagnosis not present

## 2015-02-17 DIAGNOSIS — D179 Benign lipomatous neoplasm, unspecified: Secondary | ICD-10-CM

## 2015-02-17 DIAGNOSIS — G4733 Obstructive sleep apnea (adult) (pediatric): Secondary | ICD-10-CM

## 2015-02-17 DIAGNOSIS — E785 Hyperlipidemia, unspecified: Secondary | ICD-10-CM

## 2015-02-17 LAB — POCT GLYCOSYLATED HEMOGLOBIN (HGB A1C): Hemoglobin A1C: 8.1

## 2015-02-17 LAB — GLUCOSE, CAPILLARY: Glucose-Capillary: 163 mg/dL — ABNORMAL HIGH (ref 65–99)

## 2015-02-17 MED ORDER — ROSUVASTATIN CALCIUM 20 MG PO TABS: 20.0000 mg | ORAL_TABLET | Freq: Every day | ORAL | Status: DC

## 2015-02-17 MED ORDER — METFORMIN HCL 1000 MG PO TABS: 1000.0000 mg | ORAL_TABLET | Freq: Two times a day (BID) | ORAL | Status: DC

## 2015-02-17 MED ORDER — OXYCODONE-ACETAMINOPHEN 5-325 MG PO TABS: 1.0000 | ORAL_TABLET | Freq: Three times a day (TID) | ORAL | Status: DC | PRN

## 2015-02-17 MED ORDER — GABAPENTIN 300 MG PO CAPS: 300.0000 mg | ORAL_CAPSULE | Freq: Every day | ORAL | Status: DC

## 2015-02-17 MED ORDER — HYDROCHLOROTHIAZIDE 25 MG PO TABS: 25.0000 mg | ORAL_TABLET | Freq: Every day | ORAL | Status: DC

## 2015-02-17 MED ORDER — SITAGLIPTIN PHOSPHATE 100 MG PO TABS: ORAL_TABLET | ORAL | Status: DC

## 2015-02-17 NOTE — Progress Notes (Signed)
Patient ID: [name], female   DOB: [birth year], 54 y.o.   MRN: [id number]   Subjective:   Patient ID: [name] female   DOB: Nov 14, 1960 54 y.o.   MRN: [id number]  HPI: [name] is a 54 y.o. [name] is a 54 y.o. female with a history of DM2, hypertension, hyperlipidemia, obesity and chronic pain who presents for a routine checkup. Patient states has these bumps on her arms for years and was told in the past they were "fat pockets." States they have remained the same in size for years and are generally non-tender, however, mild tenderness experienced occassionally. No associated fever, chills, pruritis, erythema, or joint pains. She is also complaining of cramping pain and spasms in her legs at night. Denies any swelling of legs, no skin changes, no loss of hair. States her fingers become numb and she has "electronic shock like" pain in her feet. States she was on Gabapentin for diabetic neuropathy but stopped taking it.       Past Medical History  Diagnosis Date  . Chronic back pain   . Hyperlipidemia   . Hypertension   . Uterine fibroid     s/p hysterectomy  . Cocaine abuse     in remission  . Tobacco abuse   . Chronic leg pain     due to back pain  . Diabetes     with neuropathy  . RECTAL BLEEDING 12/09/2008    Annotation: s/p EGD 7/08 mild gastritis, s/p colonoscopy 7/08- benign polyp  s/p polypectomy and isolated diverticulum.  Qualifier: Diagnosis of  By: Ditzler RN, Debra    . Sleep apnea    Current Outpatient Prescriptions  Medication Sig Dispense Refill  . acyclovir (ZOVIRAX) 400 MG tablet Take 1 tablet (400 mg total) by mouth 2 (two) times daily. 60 tablet 3  . albuterol (PROVENTIL HFA;VENTOLIN HFA) 108 (90 BASE) MCG/ACT inhaler Inhale 2 puffs into the lungs every 4 (four) hours as needed for wheezing or shortness of breath. 18 g 5  . aspirin 81 MG EC tablet Take 81 mg by mouth daily.      . budesonide-formoterol (SYMBICORT) 80-4.5 MCG/ACT inhaler Inhale 2  puffs into the lungs 2 (two) times daily. 1 Inhaler 12  . Calcium Carbonate-Vitamin D (CALCIUM 600+D) 600-400 MG-UNIT per tablet Take 1 tablet by mouth daily.      . chlorpheniramine (CHLORPHEN) 4 MG tablet Take 1 tab in AM and 2 tabs in PM    . dextromethorphan (DELSYM) 30 MG/5ML liquid Take 30 mg by mouth 2 (two) times daily as needed.     . diazepam (VALIUM) 2 MG tablet Take 1 tablet (2 mg total) by mouth at bedtime as needed for muscle spasms. 30 tablet 0  . gabapentin (NEURONTIN) 300 MG capsule Take 1 capsule (300 mg total) by mouth at bedtime. 30 capsule 5  . hydrochlorothiazide (HYDRODIURIL) 25 MG tablet Take 1 tablet (25 mg total) by mouth daily. 90 tablet 3  . ibuprofen (ADVIL,MOTRIN) 800 MG tablet Take 1 tablet (800 mg total) by mouth every 8 (eight) hours as needed. 30 tablet 0  . Insulin Isophane & Regular Human (HUMULIN 70/30 KWIKPEN) (70-30) 100 UNIT/ML PEN Inject 75 Units into the skin 2 (two) times daily. 45 mL 6  . Insulin NPH Isophane & Regular (RELION 70/30 [place]) Inject 75 Units into the skin 2 (two) times daily.    . Insulin Pen Needle (B-D UF III MINI PEN NEEDLES) 31G X 5 MM MISC Use to  inject insulin twice a day 100 each 5  . Insulin Syringe-Needle U-100 31G X 15/64" 1 ML MISC Use to give injections twice daily.  Dx code ICD-9 250.00. 100 each 1  . metFORMIN (GLUCOPHAGE) 1000 MG tablet Take 1 tablet (1,000 mg total) by mouth 2 (two) times daily with a meal. 180 tablet 3  . mometasone (NASONEX) 50 MCG/ACT nasal spray Place 2 sprays into the nose daily as needed.    . Multiple Vitamin (MULTIVITAMIN WITH MINERALS) TABS tablet Take 1 tablet by mouth daily.    Marland Kitchen oxyCODONE-acetaminophen (PERCOCET/ROXICET) 5-325 MG per tablet Take 1 tablet by mouth every 8 (eight) hours as needed. 90 tablet 0  . pantoprazole (PROTONIX) 40 MG tablet Take 40 mg by mouth daily.    . potassium chloride (K-DUR) 10 MEQ tablet Take 1 tablet (10 mEq total) by mouth daily. 30 tablet 3  . rosuvastatin (CRESTOR)  20 MG tablet Take 1 tablet (20 mg total) by mouth at bedtime. 30 tablet 3  . sitaGLIPtin (JANUVIA) 100 MG tablet TAKE ONE TABLET BY MOUTH  DAILY 30 tablet 6  . [DISCONTINUED] esomeprazole (NEXIUM) 20 MG capsule Take 1 capsule (20 mg total) by mouth daily. 90 capsule 3  . [DISCONTINUED] simvastatin (ZOCOR) 40 MG tablet TAKE ONE TABLET BY MOUTH EVERY DAY 60 tablet 6   No current facility-administered medications for this visit.   Family History  Problem Relation Age of Onset  . Diabetes Father   . Heart disease Father   . Hypertension Father   . Diabetes Mother   . Cancer Mother     brain  . Hypertension Mother    Social History   Social History  . Marital Status: Married    Spouse Name: N/A  . Number of Children: N/A  . 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    Social History Main Topics  . Smoking status: Former Smoker -- 0.50 packs/day for 20 years    Types: Cigarettes    Quit date: 07/23/1998  . Smokeless tobacco: Never Used  . Alcohol Use: No     Comment: recovering addict clean for 5 years  . Drug Use: No     Comment: recovering addict clean for 5 years  . Sexual Activity: Not on file   Other Topics Concern  . Not on file   Social History Narrative   Unemployed but has started taking community college classes (interested in accounting) since having back surgery, which has improved pain and mobility. Hopes to return to work soon. Lives with husband and daughter.  Uninsured.   Review of Systems: Review of Systems  Constitutional: Negative for fever and chills.  HENT: Negative for ear pain.   Eyes: Negative for blurred vision and pain.  Respiratory: Negative for cough, shortness of breath and wheezing.   Cardiovascular: Negative for chest pain, palpitations and leg swelling.  Gastrointestinal: Negative for nausea, vomiting, abdominal pain, diarrhea and constipation.  Genitourinary: Negative for dysuria, urgency and  frequency.  Musculoskeletal: Negative for joint pain.       Cramps  Skin: Negative for itching and rash.  Neurological: Positive for sensory change. Negative for headaches.   Objective:  Physical Exam: There were no vitals filed for this visit. Physical Exam  Constitutional: She is oriented to person, place, and time. She appears well-developed and well-nourished. No distress.  HENT:  Head: Normocephalic and atraumatic.  Eyes: EOM are normal. Pupils are equal, round, and reactive to light.  Neck: Neck supple. No tracheal deviation present.  Cardiovascular: Normal rate, regular rhythm and intact distal pulses.   +2 dorsalis pedis and posterior tibial pulses bilaterally.   Pulmonary/Chest: Effort normal. No respiratory distress. She has no wheezes. She has no rales.  Abdominal: Soft. Bowel sounds are normal. She exhibits no distension. There is no tenderness.  Musculoskeletal: She exhibits no edema.  Neurological: She is alert and oriented to person, place, and time.  CN II-XII grossly intact. Strength and sensation grossly intact in b/l upper and lower extremities.   Skin: Skin is warm and dry.  Small nodules (2 x 2 cm): non-erythematous, non-tender. Location -dorsal aspect of right forearm, right arm, left arm, and right thigh.      Assessment & Plan:

## 2015-02-17 NOTE — Patient Instructions (Signed)
With your type of insulin it can be helpful to eat every 2-3 hours    9 AM    Breakfast: 300 calories 11 AM  Snack:100 calories 1  PM  Lunch: 400 calories 3  PM  Snack:100 calories 5  PM  Dinner: 600 calories 9  PM  Snack:100 calories   Calorie Counting for Weight Loss Calories are energy you get from the things you eat and drink. Your body uses this energy to keep you going throughout the day. The number of calories you eat affects your weight. When you eat more calories than your body needs, your body stores the extra calories as fat. When you eat fewer calories than your body needs, your body burns fat to get the energy it needs. Calorie counting means keeping track of how many calories you eat and drink each day. If you make sure to eat fewer calories than your body needs, you should lose weight. In order for calorie counting to work, you will need to eat the number of calories that are right for you in a day to lose a healthy amount of weight per week. A healthy amount of weight to lose per week is usually 1-2 lb (0.5-0.9 kg). A dietitian can determine how many calories you need in a day and give you suggestions on how to reach your calorie goal.  WHAT IS MY MY PLAN? My goal is to have __________ calories per day.  If I have this many calories per day, I should lose around __________ pounds per week. WHAT DO I NEED TO KNOW ABOUT CALORIE COUNTING? In order to meet your daily calorie goal, you will need to:  Find out how many calories are in each food you would like to eat. Try to do this before you eat.  Decide how much of the food you can eat.  Write down what you ate and how many calories it had. Doing this is called keeping a food log. WHERE DO I FIND CALORIE INFORMATION? The number of calories in a food can be found on a Nutrition Facts label. Note that all the information on a label is based on a specific serving of the food. If a food does not have a Nutrition Facts label, try to  look up the calories online or ask your dietitian for help. HOW DO I DECIDE HOW MUCH TO EAT? To decide how much of the food you can eat, you will need to consider both the number of calories in one serving and the size of one serving. This information can be found on the Nutrition Facts label. If a food does not have a Nutrition Facts label, look up the information online or ask your dietitian for help. Remember that calories are listed per serving. If you choose to have more than one serving of a food, you will have to multiply the calories per serving by the amount of servings you plan to eat. For example, the label on a package of bread might say that a serving size is 1 slice and that there are 90 calories in a serving. If you eat 1 slice, you will have eaten 90 calories. If you eat 2 slices, you will have eaten 180 calories. HOW DO I KEEP A FOOD LOG? After each meal, record the following information in your food log:  What you ate.  How much of it you ate.  How many calories it had.  Then, add up your calories. Keep your food log  near you, such as in a small notebook in your pocket. Another option is to use a mobile app or website. Some programs will calculate calories for you and show you how many calories you have left each time you add an item to the log. WHAT ARE SOME CALORIE COUNTING TIPS?  Use your calories on foods and drinks that will fill you up and not leave you hungry. Some examples of this include foods like nuts and nut butters, vegetables, lean proteins, and high-fiber foods (more than 5 g fiber per serving).  Eat nutritious foods and avoid empty calories. Empty calories are calories you get from foods or beverages that do not have many nutrients, such as candy and soda. It is better to have a nutritious high-calorie food (such as an avocado) than a food with few nutrients (such as a bag of chips).  Know how many calories are in the foods you eat most often. This way, you do  not have to look up how many calories they have each time you eat them.  Look out for foods that may seem like low-calorie foods but are really high-calorie foods, such as baked goods, soda, and fat-free candy.  Pay attention to calories in drinks. Drinks such as sodas, specialty coffee drinks, alcohol, and juices have a lot of calories yet do not fill you up. Choose low-calorie drinks like water and diet drinks.  Focus your calorie counting efforts on higher calorie items. Logging the calories in a garden salad that contains only vegetables is less important than calculating the calories in a milk shake.  Find a way of tracking calories that works for you. Get creative. Most people who are successful find ways to keep track of how much they eat in a day, even if they do not count every calorie. WHAT ARE SOME PORTION CONTROL TIPS?  Know how many calories are in a serving. This will help you know how many servings of a certain food you can have.  Use a measuring cup to measure serving sizes. This is helpful when you start out. With time, you will be able to estimate serving sizes for some foods.  Take some time to put servings of different foods on your favorite plates, bowls, and cups so you know what a serving looks like.  Try not to eat straight from a bag or box. Doing this can lead to overeating. Put the amount you would like to eat in a cup or on a plate to make sure you are eating the right portion.  Use smaller plates, glasses, and bowls to prevent overeating. This is a quick and easy way to practice portion control. If your plate is smaller, less food can fit on it.  Try not to multitask while eating, such as watching TV or using your computer. If it is time to eat, sit down at a table and enjoy your food. Doing this will help you to start recognizing when you are full. It will also make you more aware of what and how much you are eating. HOW CAN I CALORIE COUNT WHEN EATING OUT?  Ask  for smaller portion sizes or child-sized portions.  Consider sharing an entree and sides instead of getting your own entree.  If you get your own entree, eat only half. Ask for a box at the beginning of your meal and put the rest of your entree in it so you are not tempted to eat it.  Look for the calories on  the menu. If calories are listed, choose the lower calorie options.  Choose dishes that include vegetables, fruits, whole grains, low-fat dairy products, and lean protein. Focusing on smart food choices from each of the 5 food groups can help you stay on track at restaurants.  Choose items that are boiled, broiled, grilled, or steamed.  Choose water, milk, unsweetened iced tea, or other drinks without added sugars. If you want an alcoholic beverage, choose a lower calorie option. For example, a regular margarita can have up to 700 calories and a glass of wine has around 150.  Stay away from items that are buttered, battered, fried, or served with cream sauce. Items labeled "crispy" are usually fried, unless stated otherwise.  Ask for dressings, sauces, and syrups on the side. These are usually very high in calories, so do not eat much of them.  Watch out for salads. Many people think salads are a healthy option, but this is often not the case. Many salads come with bacon, fried chicken, lots of cheese, fried chips, and dressing. All of these items have a lot of calories. If you want a salad, choose a garden salad and ask for grilled meats or steak. Ask for the dressing on the side, or ask for olive oil and vinegar or lemon to use as dressing.  Estimate how many servings of a food you are given. For example, a serving of cooked rice is  cup or about the size of half a tennis ball or one cupcake wrapper. Knowing serving sizes will help you be aware of how much food you are eating at restaurants. The list below tells you how big or small some common portion sizes are based on everyday  objects.  1 oz--4 stacked dice.  3 oz--1 deck of cards.  1 tsp--1 dice.  1 Tbsp-- a Ping-Pong ball.  2 Tbsp--1 Ping-Pong ball.   cup--1 tennis ball or 1 cupcake wrapper.  1 cup--1 baseball.

## 2015-02-17 NOTE — Progress Notes (Addendum)
  Medical Nutrition Therapy:  Appt start time: 0958  end time:  01045. Last MNT visit 01/09/15.  Weight  and a1C stable. Both are elevated above goals for this patient: A1C 8.1% with goal < 7%, weight 261#, goal 240# ~ 7% weight loss   Assessment:  Primary concerns today: glycemic control.  Patient feels she has made some improvements in her behavior such as eating smaller portions of sweets and junk food and taking her PM insulin as instructed. . is very conc.  She continues to work on making healthy changes with her spouse who wants to lose weight and her boss as support at work. And reports She walked some.   MEDICATIONS: metformin 1000 mg twice a day, januvia 100 mg daily, 75 units ReliOn 70/30 insulin twice daily   BLOOD SUGARS: average is now 185 . She checks > 3x/day.  DIETARY INTAKE: Usual eating pattern includes 2-3 meals and 0-2 snacks per day. Everyday foods include used to eat fried foods, starchy foods and eats out often- subway and [employer]aco [employer].  Avoided foods include milk, regular soda   24-hr recall:  B ( sometimes skips, sometimes early and some times later am): oatmeal, greek yogurt,  nuts, dried fruit, granola, water, coffee, apple Lunch [employer] subway or [employer] snack greek yogurt,  D (After 7 PM most days): baked fish, french fries, water Snk ( PM): nuts Beverages: water, unsweetened tea, coffee  Usual physical activity: takes stairs at work and  started walking 10-15 minutes 2x/week  Estimated daily energy needs for 1-2 # weight loss/week: 1200-1600 calories ~ 1000-1100 calories from carbs and protein 85-90 g protein   Progress Towards Goal(s):  Some progress- minute   Nutritional Diagnosis:  NB-1.1 Food and nutrition-related knowledge deficit As related to lack of knowledge about diabetes medicine action along with incorporating lifestyle change again showing gradual  improvement As evidenced by her report, questions, her requesting assistance with decreasing a1C  >8%.    Intervention:  Nutrition education and support about  affect of exercise, healthy food choices, importance of support and when to adjust medications to promote weight loss along with lowered blood sugars. Coordination of care- patient's insulin regimen is not fitting with her lifestyle and needs, she likely does not need that much insulin if it were timed better,  She is going to try to work on timing her insulin properly with her food. if that does not work to achieve CBGs at goal then would consider alternative insulin or diabetes medicine. Consider decreasing her doses to 70 units twice daily ( 1.2 units/kg) in light of two recent low blood sugars Teaching Method Utilized: Visual & Auditory Handouts given during visit include:  AVS Barriers to learning/adherence to lifestyle change: lack of sufficient support at times, competing values  Demonstrated degree of understanding via:  Teach Back   Monitoring/Evaluation:  Dietary intake, exercise, meter, and body weight in 4 week(s) follow up on phone next week

## 2015-02-17 NOTE — Patient Instructions (Signed)
[name] it was nice meeting you today. Please return for a follow up visit in 3 months.

## 2015-02-18 DIAGNOSIS — D179 Benign lipomatous neoplasm, unspecified: Secondary | ICD-10-CM | POA: Insufficient documentation

## 2015-02-18 LAB — BASIC METABOLIC PANEL
BUN/Creatinine Ratio: 14 (ref 9–23)
BUN: 9 mg/dL (ref 6–24)
CO2: 27 mmol/L (ref 18–29)
Calcium: 10 mg/dL (ref 8.7–10.2)
Chloride: 97 mmol/L (ref 97–108)
Creatinine, Ser: 0.63 mg/dL (ref 0.57–1.00)
GFR calc Af Amer: 118 mL/min/{1.73_m2} (ref >59)
GFR, EST NON AFRICAN AMERICAN: 102 mL/min/{1.73_m2} (ref >59)
GLUCOSE: 91 mg/dL (ref 65–99)
Potassium: 3.8 mmol/L (ref 3.5–5.2)
SODIUM: 142 mmol/L (ref 134–144)

## 2015-02-18 NOTE — Assessment & Plan Note (Addendum)
Percocet 5-325 1 tab q8 prn, #90 refilled at this visit. Patient reports having improved function with this drug.  -UDS pending

## 2015-02-18 NOTE — Assessment & Plan Note (Addendum)
BP Readings from Last 3 Encounters:  02/17/15 122/68  11/20/14 127/72  08/28/14 138/67    Lab Results  Component Value Date   NA 142 02/17/2015   K 3.8 02/17/2015   CREATININE 0.63 02/17/2015    Assessment: Blood pressure control:  good Progress toward BP goal:   at goal  Comments:   Plan: Medications: HCTZ 25 mg qd Educational resources provided:  Educated about healthy eating and exercise.  Self management tools provided:   Other plans: Refilled HCTZ 25 mg qd today. K normal. Spoke to patient on phone 02/18/15 at 1:19 pm, she is aware. Patient also taking Potassium 10 meq daily and has been advised to continue taking it, esp because she has a history of muscle cramps.

## 2015-02-18 NOTE — Assessment & Plan Note (Addendum)
Lab Results  Component Value Date   HGBA1C 8.1 02/17/2015   HGBA1C 8.0 11/20/2014   HGBA1C 8.8 08/28/2014     Assessment: Diabetes control:  poor Progress toward A1C goal:   poor Comments: Blood glucose showing avg reading 179 and low 69.  Patient reports having one episode of hypoglycemia (feeling shaky) when she didn't eat breakfast with her insulin. No increase in medication dose at this point. Patient has been advised to work on diet and exercise.    Plan: Medications:  Metformin 1000 mg BID, Januvia 100 mg QD, Novolin 70/30 75 u BID with meals Home glucose monitoring: Frequency:   three times/ day Timing: pre-prandial  Instruction/counseling given: Healthy eating, exercise, and weight loss.  Educational resources provided:   Self management tools provided:   Other plans: Pt reports she stopped taking taking Gabapentin. Presents today with complaint of cramps in her legs at night, numbness of fingers, and electrical shock like pain in her feet. Symptoms most likely 2/2 diabetic neuropathy and stopping Gabapentin. She has been advised to continue taking Gabapentin 300 mg daily. Reassess at next visit.

## 2015-02-18 NOTE — Assessment & Plan Note (Signed)
Presence of small 2x2 cm nodules on right forearm, right arm, left arm, and right thigh. As per pt, they have been present for years and not changed. Non-erythematous, generally non-tender, not associated with any joint problems. No fever or chills. 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  -Patient was offered a referral to dermatology and is thinking about it. -Address at next visit

## 2015-02-18 NOTE — Assessment & Plan Note (Signed)
Refilled Rosuvastatin 20 mg qhs today.

## 2015-02-18 NOTE — Assessment & Plan Note (Signed)
Most recent sleep study normal - did not show significant sleep apnea.

## 2015-02-20 NOTE — Progress Notes (Signed)
I saw and evaluated the patient.  I personally confirmed the key portions of the history and exam documented by Dr. Rathore and I reviewed pertinent patient test results.  The assessment, diagnosis, and plan were formulated together and I agree with the documentation in the resident's note. 

## 2015-02-26 ENCOUNTER — Telehealth: Payer: Self-pay | Admitting: Dietician

## 2015-02-26 NOTE — Telephone Encounter (Signed)
CDE called patient today and acknowledged her note and informed her that CDE would provided feedback when food tracker received. She reports she is doing even better this week.  CDE asked if patient knows how to use My chart to communicate with CDE. Patient denies being signed up to use mychart. This is something we can work on at a future visit.

## 2015-02-26 NOTE — Telephone Encounter (Signed)
Patient sent the following email yesterday:  Good Morning [name]- I will be mailing my Food Tracker sheets to you today. I noticed on some of my days how I went over and other days I was under the recommendations we set for myself. It is an eye opener for me because I am receiving a lot of information pertaining to what I eat. I am doing walking at my desk and when I do my daily activities but I do not track this. I do know that I need to do more. My sugar reading are looking fine, just a few over 200 but I understand why. Thank you for taking time out to assist me with my Health concerns. 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 Thank you,   [name]

## 2015-02-27 LAB — BENZODIAZEPINES CONFIRM, URINE
ALPRAZOLAM: NEGATIVE
BENZODIAZEPINES: POSITIVE ng/mL — AB
CLONAZEPAM: NEGATIVE
Flurazepam: NEGATIVE
LORAZEPAM: NEGATIVE
MIDAZOLAM: NEGATIVE
NORDIAZEPAM: NEGATIVE
OXAZEPAM GC/MS CONF: 302 ng/mL
OXAZEPAM UR: POSITIVE — AB
TEMAZEPAM CONFIRM: 304 ng/mL
TEMAZEPAM: POSITIVE — AB
TRIAZOLAM: NEGATIVE

## 2015-02-27 LAB — TAPENTADOL CONFIRM, URINE: Tapentadol: NEGATIVE

## 2015-02-27 LAB — PRESCRIPTION ABUSE MONITORING 17P, URINE
6-Acetylmorphine, Urine: NEGATIVE ng/mL
Amphetamine Scrn, Ur: NEGATIVE ng/mL
BARBITURATE SCREEN URINE: NEGATIVE ng/mL
Buprenorphine, Urine: NEGATIVE ng/mL
CANNABINOIDS UR QL SCN: NEGATIVE ng/mL
CARISOPRODOL/MEPROBAMATE, UR: NEGATIVE ng/mL
Cocaine (Metab) Scrn, Ur: NEGATIVE ng/mL
Creatinine(Crt), U: 256.3 mg/dL (ref 20.0–300.0)
EDDP, URINE: NEGATIVE ng/mL
FENTANYL, URINE: NEGATIVE pg/mL
MDMA Screen, Urine: NEGATIVE ng/mL
MEPERIDINE SCREEN, URINE: NEGATIVE ng/mL
Methadone Screen, Urine: NEGATIVE ng/mL
NITRITE URINE, QUANTITATIVE: NEGATIVE ug/mL
OXYCODONE+OXYMORPHONE UR QL SCN: NEGATIVE ng/mL
PH UR, DRUG SCRN: 6.6 (ref 4.5–8.9)
PROPOXYPHENE SCREEN URINE: NEGATIVE ng/mL
Phencyclidine Qn, Ur: NEGATIVE ng/mL
SPECIFIC GRAVITY: 1.021

## 2015-02-27 LAB — TRAMADOL (GC/MS), URINE
TRAMADOL: POSITIVE — AB
Tramadol (GC/MS): >3000 ng/mL

## 2015-02-27 LAB — OPIATES CONFIRMATION, URINE: OPIATES: NEGATIVE ng/mL

## 2015-03-03 NOTE — Telephone Encounter (Signed)
Food tracker received through the mail. Patient diligently kept daily food records for 7 days. Her average calorie intake for those 7 days was 1500-1600 calories per day. She also motes walking on several days as her activity. Mailed the food tracker back to her with comments and encouragement to entice her to look back and reveiw her records.

## 2015-03-06 ENCOUNTER — Other Ambulatory Visit: Payer: Self-pay | Admitting: Internal Medicine

## 2015-03-06 NOTE — Telephone Encounter (Signed)
Pt can not afford to purchased Januvia.Please call pt back.

## 2015-03-06 NOTE — Telephone Encounter (Signed)
Spoke w/ dr Marlowe Sax, called pt and instructed her to go to [place] site and good rx to print coupons. She will try this and call back if she has problems

## 2015-03-15 ENCOUNTER — Other Ambulatory Visit: Payer: Self-pay | Admitting: Internal Medicine

## 2015-03-17 ENCOUNTER — Telehealth: Payer: Self-pay | Admitting: Dietician

## 2015-03-17 ENCOUNTER — Telehealth: Payer: Self-pay | Admitting: Internal Medicine

## 2015-03-17 NOTE — Telephone Encounter (Signed)
She says her pharmacy will not take a coupon card for her Celesta Gentile that triage nurse told her to get online. She calls asking for one. Most of the time, these coupon and copay cards are not abel to be sued with medicare which she has. A more affordable option for diabetes medicine that will not cause weight gain is acarbose. It should be started at it's lowest dose and titrated slowly to avoid/minimize side effects. It is 6$ copay on her plan compared to 40$ for the [place]

## 2015-03-20 NOTE — Telephone Encounter (Signed)
Will route this message to Dr. Maudie Mercury to see if she has ideas of how to help patient afford Januvia

## 2015-03-24 ENCOUNTER — Other Ambulatory Visit: Payer: Self-pay | Admitting: Internal Medicine

## 2015-03-24 MED ORDER — PIOGLITAZONE HCL 15 MG PO TABS: 15.0000 mg | ORAL_TABLET | Freq: Every day | ORAL | Status: DC

## 2015-03-24 NOTE — Telephone Encounter (Signed)
Switched patient to Actos because it is covered by her insurance. Spoke to her over the phone today and she understands. Thanks.

## 2015-04-08 ENCOUNTER — Other Ambulatory Visit: Payer: Self-pay | Admitting: Internal Medicine

## 2015-04-08 DIAGNOSIS — M549 Dorsalgia, unspecified: Secondary | ICD-10-CM

## 2015-04-08 NOTE — Telephone Encounter (Signed)
Last office visit: 02-17-15 Last UDS: 02-17-15 Last Refill: 02-21-15 (confirmed with pharmacy) Next appt: 05-19-15  Will send request to pcp for review, please advise.[name]10/26/201610:57 AM

## 2015-04-08 NOTE — Telephone Encounter (Signed)
Pt called requesting oxycodone to be filled. °

## 2015-04-09 MED ORDER — OXYCODONE-ACETAMINOPHEN 5-325 MG PO TABS: 1.0000 | ORAL_TABLET | Freq: Three times a day (TID) | ORAL | Status: DC | PRN

## 2015-04-09 NOTE — Telephone Encounter (Signed)
Pt informed

## 2015-04-10 ENCOUNTER — Other Ambulatory Visit: Payer: Self-pay | Admitting: *Deleted

## 2015-04-10 DIAGNOSIS — M549 Dorsalgia, unspecified: Secondary | ICD-10-CM

## 2015-04-10 MED ORDER — OXYCODONE-ACETAMINOPHEN 5-325 MG PO TABS: 1.0000 | ORAL_TABLET | Freq: Three times a day (TID) | ORAL | Status: DC | PRN

## 2015-04-10 NOTE — Telephone Encounter (Signed)
Unable to locate previous rx signed by DrRathore, duplicate rx printed and signed by DrRathore.[name]10/28/20162:42 PM

## 2015-04-19 ENCOUNTER — Other Ambulatory Visit: Payer: Self-pay | Admitting: Internal Medicine

## 2015-05-12 ENCOUNTER — Telehealth: Payer: Self-pay | Admitting: Dietician

## 2015-05-13 NOTE — Telephone Encounter (Signed)
Called patient to follow up on new diabetes medicine- recently started on actos. She says it is not working and that her sugars are high.  240-250s first thing in the morning. She says her high blood sugars could be due to stress as she lost her job, and also has family stress. Eating schedule is off.   She applied for [employer] insurance for next year. Signed up for extra help, she is wondering if she can retry Januvia because it should now be affordable for her. Told her I would relay this information to her doctor so they can discuss at her appointment next week. Patient is interested in group mnt visits. CDE will contact her when next one is scheduled

## 2015-05-14 NOTE — Telephone Encounter (Signed)
Thanks for the update [name]. I agree, it is best if I discuss this with the patient at her upcoming visit next week. I'm happy to hear she can afford the Januvia now.

## 2015-05-19 ENCOUNTER — Encounter: Payer: Self-pay | Admitting: Internal Medicine

## 2015-05-19 ENCOUNTER — Ambulatory Visit (INDEPENDENT_AMBULATORY_CARE_PROVIDER_SITE_OTHER): Payer: Medicare Other | Admitting: Internal Medicine

## 2015-05-19 VITALS — BP 127/67 | HR 101 | Temp 98.2°F | Ht 64.0 in | Wt 260.2 lb

## 2015-05-19 DIAGNOSIS — Z794 Long term (current) use of insulin: Secondary | ICD-10-CM | POA: Diagnosis not present

## 2015-05-19 DIAGNOSIS — E1140 Type 2 diabetes mellitus with diabetic neuropathy, unspecified: Secondary | ICD-10-CM | POA: Diagnosis not present

## 2015-05-19 DIAGNOSIS — R252 Cramp and spasm: Secondary | ICD-10-CM

## 2015-05-19 DIAGNOSIS — E1165 Type 2 diabetes mellitus with hyperglycemia: Secondary | ICD-10-CM

## 2015-05-19 DIAGNOSIS — Z7984 Long term (current) use of oral hypoglycemic drugs: Secondary | ICD-10-CM | POA: Diagnosis not present

## 2015-05-19 DIAGNOSIS — Z7951 Long term (current) use of inhaled steroids: Secondary | ICD-10-CM

## 2015-05-19 DIAGNOSIS — Z23 Encounter for immunization: Secondary | ICD-10-CM

## 2015-05-19 DIAGNOSIS — J449 Chronic obstructive pulmonary disease, unspecified: Secondary | ICD-10-CM

## 2015-05-19 DIAGNOSIS — M549 Dorsalgia, unspecified: Secondary | ICD-10-CM

## 2015-05-19 DIAGNOSIS — G8929 Other chronic pain: Secondary | ICD-10-CM

## 2015-05-19 DIAGNOSIS — Z0000 Encounter for general adult medical examination without abnormal findings: Secondary | ICD-10-CM

## 2015-05-19 DIAGNOSIS — M5417 Radiculopathy, lumbosacral region: Secondary | ICD-10-CM

## 2015-05-19 LAB — POCT GLYCOSYLATED HEMOGLOBIN (HGB A1C): Hemoglobin A1C: 9.3

## 2015-05-19 LAB — GLUCOSE, CAPILLARY: Glucose-Capillary: 158 mg/dL — ABNORMAL HIGH (ref 65–99)

## 2015-05-19 MED ORDER — DIAZEPAM 2 MG PO TABS
2.0000 mg | ORAL_TABLET | Freq: Every evening | ORAL | Status: DC | PRN
Start: 2015-05-19 — End: 2015-07-21

## 2015-05-19 MED ORDER — INSULIN PEN NEEDLE 31G X 5 MM MISC: Status: DC

## 2015-05-19 MED ORDER — "INSULIN SYRINGE-NEEDLE U-100 31G X 15/64"" 1 ML MISC": Status: DC

## 2015-05-19 MED ORDER — GABAPENTIN 300 MG PO CAPS: 600.0000 mg | ORAL_CAPSULE | Freq: Every day | ORAL | Status: DC

## 2015-05-19 MED ORDER — INSULIN ISOPHANE & REGULAR (HUMAN 70-30)100 UNIT/ML KWIKPEN: 75.0000 [IU] | PEN_INJECTOR | Freq: Two times a day (BID) | SUBCUTANEOUS | Status: DC

## 2015-05-19 MED ORDER — BUDESONIDE-FORMOTEROL FUMARATE 80-4.5 MCG/ACT IN AERO: 2.0000 | INHALATION_SPRAY | Freq: Two times a day (BID) | RESPIRATORY_TRACT | Status: DC

## 2015-05-19 MED ORDER — ALBUTEROL SULFATE HFA 108 (90 BASE) MCG/ACT IN AERS: 2.0000 | INHALATION_SPRAY | RESPIRATORY_TRACT | Status: DC | PRN

## 2015-05-19 MED ORDER — OXYCODONE-ACETAMINOPHEN 5-325 MG PO TABS: 1.0000 | ORAL_TABLET | Freq: Three times a day (TID) | ORAL | Status: DC | PRN

## 2015-05-19 NOTE — Patient Instructions (Signed)
[name] it was nice seeing you today.  -Please continue taking Januvia, Metformin, and Humulin for diabetes as instructed.  -I encourage you to eat healthy as we discussed today.  -Return for a follow-up visit in 3 months.

## 2015-05-20 LAB — MICROALBUMIN / CREATININE URINE RATIO
CREATININE, UR: 101.1 mg/dL
MICROALB/CREAT RATIO: 4.7 mg/g creat (ref 0.0–30.0)
MICROALBUM., U, RANDOM: 4.8 ug/mL

## 2015-05-20 NOTE — Progress Notes (Signed)
Patient ID: [name], female   DOB: [birth year], 54 y.o.   MRN: [id number]   Subjective:   Patient ID: [name] female   DOB: July 08, 1960 54 y.o.   MRN: [id number]  HPI: [name] is a 54 y.o. F with a PMHx of conditions listed below presenting to the clinic for a follow-up of her diabetes and chronic lower back pain. Please refer to assessment and plan for details about the patient's chronic medical conditions.     Past Medical History  Diagnosis Date  . Chronic back pain   . Hyperlipidemia   . Hypertension   . Uterine fibroid     s/p hysterectomy  . Cocaine abuse     in remission  . Tobacco abuse   . Chronic leg pain     due to back pain  . 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 RECTAL BLEEDING 12/09/2008    Annotation: s/p EGD 7/08 mild gastritis, s/p colonoscopy 7/08- benign polyp  s/p polypectomy and isolated diverticulum.  Qualifier: Diagnosis of  By: Ditzler RN, Debra    . Sleep apnea    Current Outpatient Prescriptions  Medication Sig Dispense Refill  . acyclovir (ZOVIRAX) 400 MG tablet Take 1 tablet (400 mg total) by mouth 2 (two) times daily. 60 tablet 3  . albuterol (PROVENTIL HFA;VENTOLIN HFA) 108 (90 BASE) MCG/ACT inhaler Inhale 2 puffs into the lungs every 4 (four) hours as needed for wheezing or shortness of breath. 18 g 5  . aspirin 81 MG EC tablet Take 81 mg by mouth daily.      . budesonide-formoterol (SYMBICORT) 80-4.5 MCG/ACT inhaler Inhale 2 puffs into the lungs 2 (two) times daily. 1 Inhaler 12  . Calcium Carbonate-Vitamin D (CALCIUM 600+D) 600-400 MG-UNIT per tablet Take 1 tablet by mouth daily.      . chlorpheniramine (CHLORPHEN) 4 MG tablet Take 1 tab in AM and 2 tabs in PM    . diazepam (VALIUM) 2 MG tablet Take 1 tablet (2 mg total) by mouth at bedtime as needed for muscle spasms. 15 tablet 0  . gabapentin (NEURONTIN) 300 MG capsule Take 2 capsules (600 mg total) by mouth at bedtime. 60 capsule 5  . hydrochlorothiazide (HYDRODIURIL) 25 MG tablet Take  1 tablet (25 mg total) by mouth daily. 90 tablet 3  . Insulin Isophane & Regular Human (HUMULIN 70/30 KWIKPEN) (70-30) 100 UNIT/ML PEN Inject 75 Units into the skin 2 (two) times daily. 45 mL 3  . Insulin Pen Needle (B-D UF III MINI PEN NEEDLES) 31G X 5 MM MISC Use to inject insulin twice a day 100 each 5  . Insulin Syringe-Needle U-100 31G X 15/64" 1 ML MISC Use to give injections twice daily.  Dx code ICD-9 250.00. 100 each 3  . metFORMIN (GLUCOPHAGE) 1000 MG tablet Take 1 tablet (1,000 mg total) by mouth 2 (two) times daily with a meal. 180 tablet 3  . Multiple Vitamin (MULTIVITAMIN WITH MINERALS) TABS tablet Take 1 tablet by mouth daily.    Marland Kitchen oxyCODONE-acetaminophen (PERCOCET/ROXICET) 5-325 MG tablet Take 1 tablet by mouth every 8 (eight) hours as needed. 90 tablet 0  . rosuvastatin (CRESTOR) 20 MG tablet Take 1 tablet (20 mg total) by mouth at bedtime. 30 tablet 5  . dextromethorphan (DELSYM) 30 MG/5ML liquid Take 30 mg by mouth 2 (two) times daily as needed.     Marland Kitchen ibuprofen (ADVIL,MOTRIN) 800 MG tablet Take 1 tablet (800 mg total) by mouth every 8 (eight) hours  as needed. (Patient not taking: Reported on 05/19/2015) 30 tablet 0  . mometasone (NASONEX) 50 MCG/ACT nasal spray Place 2 sprays into the nose daily as needed.    . pantoprazole (PROTONIX) 40 MG tablet Take 40 mg by mouth daily.    . potassium chloride (K-DUR) 10 MEQ tablet Take 1 tablet (10 mEq total) by mouth daily. (Patient not taking: Reported on 05/19/2015) 30 tablet 3  . [DISCONTINUED] esomeprazole (NEXIUM) 20 MG capsule Take 1 capsule (20 mg total) by mouth daily. 90 capsule 3  . [DISCONTINUED] simvastatin (ZOCOR) 40 MG tablet TAKE ONE TABLET BY MOUTH EVERY DAY 60 tablet 6   No current facility-administered medications for this visit.   Family History  Problem Relation Age of Onset  . Diabetes Father   . Heart disease Father   . Hypertension Father   . Diabetes Mother   . Cancer Mother     brain  . Hypertension Mother     Social History   Social History  . Marital Status: Married    Spouse Name: N/A  . Number of Children: N/A  . 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    Social History Main Topics  . Smoking status: Former Smoker -- 0.50 packs/day for 20 years    Types: Cigarettes    Quit date: 07/23/1998  . Smokeless tobacco: Never Used  . Alcohol Use: No     Comment: recovering addict clean for 5 years  . Drug Use: No     Comment: recovering addict clean for 5 years  . Sexual Activity: Not Asked   Other Topics Concern  . None   Social History Narrative   Unemployed but has started taking community college classes (interested in accounting) since having back surgery, which has improved pain and mobility. Hopes to return to work soon. Lives with husband and daughter.  Uninsured.   Review of Systems: Constitutional: Negative for fever and chills.  HENT: Negative for ear pain.  Eyes: Negative for blurred vision and pain.  Respiratory: Negative for cough, shortness of breath and wheezing.  Cardiovascular: Negative for chest pain, palpitations and leg swelling.  Gastrointestinal: Negative for nausea, vomiting, abdominal pain, diarrhea and constipation.  Genitourinary: Negative for dysuria, urgency and frequency.  Musculoskeletal: Negative for myalgias.  Skin: Negative for itching and rash.  Neurological: Negative for headaches.   Objective:  Physical Exam: Filed Vitals:   05/19/15 1348  BP: 127/67  Pulse: 101  Temp: 98.2 F (36.8 C)  TempSrc: Oral  Height: 5\' 4"  (1.626 m)  Weight: 118.026 kg (260 lb 3.2 oz)  SpO2: 100%   Physical Exam  Constitutional: She is oriented to person, place, and time. She appears well-developed and well-nourished. No distress.  HENT:  Head: Normocephalic and atraumatic.  Eyes: EOM are normal. Pupils are equal, round, and reactive to light.  Neck: Neck supple. No tracheal deviation present.  Cardiovascular:  Normal rate, regular rhythm and intact distal pulses.  Exam reveals no gallop and no friction rub.   No murmur heard. Pulmonary/Chest: Effort normal and breath sounds normal. No respiratory distress. She has no wheezes. She has no rales.  Abdominal: Soft. Bowel sounds are normal. She exhibits no distension. There is no tenderness.  Musculoskeletal: She exhibits no edema.  Neurological: She is alert and oriented to person, place, and time.  Strength and sensation grossly intact in bilateral lower extremities. Straight leg raise test positive at 30 degrees on the left and 45  degrees on the right.   Skin: Skin is warm and dry.  3 old healed 3 x 3 mm blisters on the plantar surface of her left foot.    Assessment & Plan:

## 2015-05-21 DIAGNOSIS — Z0000 Encounter for general adult medical examination without abnormal findings: Secondary | ICD-10-CM | POA: Insufficient documentation

## 2015-05-21 NOTE — Assessment & Plan Note (Signed)
Lab Results  Component Value Date   HGBA1C 9.3 05/19/2015   HGBA1C 8.1 02/17/2015   HGBA1C 8.0 11/20/2014     Assessment: Diabetes control:  uncontrolled  Progress toward A1C goal:   above goal  Comments: Patient was previously prescribed Januvia which she could not afford. As such, she was switched to Actos. Her insurance has now changes and she is able to afford to [place] again. Patient states she stopped taking Actos and instead picked up Januvia from the pharmacy. States her diet is not good due to life stressors. Glucose meter showing a high value of 278 and low 111 with an avg value of 221.  She is checking her sugars 2-3 times a day. I discussed going up on her insulin but patient has refused. She is willing to work on diet and weight loss instead. Urine microalbumin/ Cr ratio of 4.7.   Plan: Medications:  Januvia 100 mg qd, Metformin 1000 mg BID, Humulin 75 mg BID Home glucose monitoring:  Frequency:  3 times a day Timing:  before meals Instruction/counseling given: reminded to bring blood glucose meter & log to each visit, reminded to bring medications to each visit, discussed foot care, discussed the need for weight loss and discussed diet Other plans:  -Podiatry referral because patient reports getting blisters on her feet intermittently. I recommended she buy comfortable footwear.

## 2015-05-21 NOTE — Assessment & Plan Note (Addendum)
Patient has a history of L5-S1 disk herniation and S1 root compression. States she had surgery in the past and underwent physical therapy at that time. At present, reports having continued lower back pain radiating to her legs bilaterally. Denies any weakness or numbness in her legs. Denies having saddle anesthesia. Denies having any bowel or bladder incontinence. Patient is requesting a refill on Valium for back muscle spasms and also on Percocet. I discussed the option of physical therapy and she firmly refuses.  -Percocet 5-325 mg: 1 tab q8 (#90) -Valium 2 mg: 1 tab qhs for back muscle spasms (#15) -Gabapentin increased to 600 mg qhs

## 2015-05-21 NOTE — Assessment & Plan Note (Signed)
Valium 2 mg qhs (#15) refilled.

## 2015-05-21 NOTE — Assessment & Plan Note (Signed)
Albuterol and Symbicort refilled. 

## 2015-05-21 NOTE — Assessment & Plan Note (Signed)
Flu vaccine today 

## 2015-05-27 NOTE — Progress Notes (Signed)
Internal Medicine Clinic Attending  I saw and evaluated the patient.  I personally confirmed the key portions of the history and exam documented by Dr. Rathore and I reviewed pertinent patient test results.  The assessment, diagnosis, and plan were formulated together and I agree with the documentation in the resident's note.  

## 2015-06-14 ENCOUNTER — Other Ambulatory Visit: Payer: Self-pay | Admitting: Internal Medicine

## 2015-06-18 ENCOUNTER — Encounter: Payer: Self-pay | Admitting: *Deleted

## 2015-07-20 ENCOUNTER — Telehealth: Payer: Self-pay | Admitting: Dietician

## 2015-07-20 ENCOUNTER — Telehealth: Payer: Self-pay | Admitting: Internal Medicine

## 2015-07-20 DIAGNOSIS — M549 Dorsalgia, unspecified: Secondary | ICD-10-CM

## 2015-07-20 NOTE — Telephone Encounter (Signed)
Patient presenting to front desk asking for help in changing her diabetes medicine. Per conversation with CDE patient says she takes metformin, insulin and another diabetes medicine (she thinks it is [place] but this is not on her medication list) as ordered and her blood a sugars have been staying in the 200s mostly high 200s for the past few months. She reports being careful with her food choices. She now has a different Medicare plan and exctra help so her medicine are all less than 10$ and affordable. CDE suggested she make an appointment to see a doctor and bring her meter to that appointment. CDE printed out [employer] 2017 diabetes formulary and she agreed to take that to the visit as well.  She is currently on 150 units of insulin a day and her calculated insulin dose maximum is ~ 177 unit/day.

## 2015-07-20 NOTE — Telephone Encounter (Signed)
Patient walked asking about her [employer] form and had it been completed that she dropped off prev.  Informed patient form had not been completed and she may need to come in to have it completed.  Patient is already sch with you in April but her Temp/Handic[employer] Placard will expire at the end of this month.  Please advise as patient saw you last on 05/19/2015.  Patient also requesting her Oxycodone to be refilled.

## 2015-07-20 NOTE — Telephone Encounter (Addendum)
Last refill 12/6 appts 2/7 dr Heber , 09/2015 dr Marlowe Sax Last uds 02/2015

## 2015-07-21 ENCOUNTER — Other Ambulatory Visit: Payer: Self-pay | Admitting: Internal Medicine

## 2015-07-21 ENCOUNTER — Ambulatory Visit (INDEPENDENT_AMBULATORY_CARE_PROVIDER_SITE_OTHER): Payer: PPO | Admitting: Internal Medicine

## 2015-07-21 ENCOUNTER — Encounter: Payer: Self-pay | Admitting: Internal Medicine

## 2015-07-21 VITALS — BP 143/81 | HR 91 | Temp 97.9°F | Ht 64.0 in | Wt 267.1 lb

## 2015-07-21 DIAGNOSIS — M545 Low back pain: Secondary | ICD-10-CM

## 2015-07-21 DIAGNOSIS — Z794 Long term (current) use of insulin: Secondary | ICD-10-CM

## 2015-07-21 DIAGNOSIS — I10 Essential (primary) hypertension: Secondary | ICD-10-CM

## 2015-07-21 DIAGNOSIS — F1421 Cocaine dependence, in remission: Secondary | ICD-10-CM

## 2015-07-21 DIAGNOSIS — Z7984 Long term (current) use of oral hypoglycemic drugs: Secondary | ICD-10-CM

## 2015-07-21 DIAGNOSIS — G8929 Other chronic pain: Secondary | ICD-10-CM

## 2015-07-21 DIAGNOSIS — E1165 Type 2 diabetes mellitus with hyperglycemia: Secondary | ICD-10-CM | POA: Diagnosis not present

## 2015-07-21 DIAGNOSIS — E1140 Type 2 diabetes mellitus with diabetic neuropathy, unspecified: Secondary | ICD-10-CM

## 2015-07-21 DIAGNOSIS — Z79891 Long term (current) use of opiate analgesic: Secondary | ICD-10-CM | POA: Diagnosis not present

## 2015-07-21 DIAGNOSIS — M549 Dorsalgia, unspecified: Secondary | ICD-10-CM

## 2015-07-21 LAB — GLUCOSE, CAPILLARY: Glucose-Capillary: 218 mg/dL — ABNORMAL HIGH (ref 65–99)

## 2015-07-21 MED ORDER — OXYCODONE-ACETAMINOPHEN 5-325 MG PO TABS: 1.0000 | ORAL_TABLET | Freq: Three times a day (TID) | ORAL | Status: DC | PRN

## 2015-07-21 MED ORDER — EXENATIDE 5 MCG/0.02ML ~~LOC~~ SOPN: 5.0000 ug | PEN_INJECTOR | Freq: Two times a day (BID) | SUBCUTANEOUS | Status: DC

## 2015-07-21 NOTE — Patient Instructions (Addendum)
General Instructions:   Please bring your medicines with you each time you come to clinic.  Medicines may include prescription medications, over-the-counter medications, herbal remedies, eye drops, vitamins, or other pills.   Progress Toward Treatment Goals:  Treatment Goal 01/01/2014  Hemoglobin A1C unchanged  Blood pressure at goal    Self Care Goals & Plans:  Self Care Goal 07/21/2015  Manage my medications take my medicines as prescribed; bring my medications to every visit; refill my medications on time  Monitor my health keep track of my blood glucose; bring my glucose meter and log to each visit  Eat healthy foods drink diet soda or water instead of juice or soda; eat more vegetables; eat foods that are low in salt; eat baked foods instead of fried foods  Be physically active find an activity I enjoy; take a walk every day  Meeting treatment goals -    Home Blood Glucose Monitoring 01/01/2014  Check my blood sugar 3 times a day  When to check my blood sugar before meals     Care Management & Community Referrals:  Referral 01/01/2014  Referrals made for care management support -  Referrals made to community resources none        Exenatide injection solution What is this medicine? EXENATIDE (ex EN a tide) is used to improve blood sugar control in adults with type 2 diabetes. This medicine may be used with other oral diabetes medicines. This medicine may be used for other purposes; ask your health care provider or pharmacist if you have questions. What should I tell my health care provider before I take this medicine? They need to know if you have any of these conditions: -history of pancreatitis -kidney disease or if you are on dialysis -stomach problems -an unusual or allergic reaction to exenatide, medicines, foods, dyes, or preservatives -pregnant or trying to get pregnant -breast-feeding How should I use this medicine? This medicine is for injection under the  skin of your upper leg, stomach area, or upper arm. You will be taught how to prepare and give this medicine. Use exactly as directed. Take your medicine at regular intervals. Do not take it more often than directed. It is important that you put your used needles and syringes in a special sharps container. Do not put them in a trash can. If you do not have a sharps container, call your pharmacist or healthcare provider to get one. A special MedGuide will be given to you by the pharmacist with each prescription and refill. Be sure to read this information carefully each time.Talk to your pediatrician regarding the use of this medicine in children. Special care may be needed. Overdosage: If you think you have taken too much of this medicine contact a poison control center or emergency room at once. NOTE: This medicine is only for you. Do not share this medicine with others. What if I miss a dose? If you miss a dose, take it as soon as you can. If it is almost time for your next dose, take only that dose. Do not take double or extra doses. What may interact with this medicine? Do not take this medicine with any of the following medications: -gatifloxacin This medicine may also interact with the following medications: -acetaminophen -birth control pills -digoxin -lisinopril -lovastatin -sulfonylureas -warfarin Many medications may cause changes in blood sugar, these include: -alcohol containing beverages -aspirin and aspirin-like drugs -chloramphenicol -chromium -diuretics -female hormones, such as estrogens or progestins, birth control pills -heart medicines -  isoniazid -female hormones or anabolic steroids -medications for weight loss -medicines for allergies, asthma, cold, or cough -medicines for mental problems -medicines called MAO inhibitors - Nardil, Parnate, Marplan, Eldepryl -niacin -NSAIDS, such as ibuprofen -pentamidine -phenytoin -probenecid -quinolone antibiotics such as  ciprofloxacin, levofloxacin, ofloxacin -some herbal dietary supplements -steroid medicines such as prednisone or cortisone -thyroid hormones Some medications can hide the warning symptoms of low blood sugar (hypoglycemia). You may need to monitor your blood sugar more closely if you are taking one of these medications. These include: -beta-blockers, often used for high blood pressure or heart problems (examples include atenolol, metoprolol, propranolol) -clonidine -guanethidine -reserpine This list may not describe all possible interactions. Give your health care provider a list of all the medicines, herbs, non-prescription drugs, or dietary supplements you use. Also tell them if you smoke, drink alcohol, or use illegal drugs. Some items may interact with your medicine. What should I watch for while using this medicine? Visit your doctor or health care professional for regular checks on your progress. A test called the HbA1C (A1C) will be monitored. This is a simple blood test. It measures your blood sugar control over the last 2 to 3 months. You will receive this test every 3 to 6 months. Learn how to check your blood sugar. Learn the symptoms of low and high blood sugar and how to manage them. Always carry a quick-source of sugar with you in case you have symptoms of low blood sugar. Examples include hard sugar candy or glucose tablets. Make sure others know that you can choke if you eat or drink when you develop serious symptoms of low blood sugar, such as seizures or unconsciousness. They must get medical help at once. Tell your doctor or health care professional if you have high blood sugar. You might need to change the dose of your medicine. If you are sick or exercising more than usual, you might need to change the dose of your medicine. Do not skip meals. Ask your doctor or health care professional if you should avoid alcohol. Many nonprescription cough and cold products contain sugar or  alcohol. These can affect blood sugar. Exenatide pens and cartridges should never be shared. Even if the needle is changed, sharing may result in passing of viruses like hepatitis or HIV. Wear a medical ID bracelet or chain, and carry a card that describes your disease and details of your medicine and dosage times. What side effects may I notice from receiving this medicine? Side effects that you should report to your doctor or health care professional as soon as possible: -allergic reactions like skin rash, itching or hives, swelling of the face, lips, or tongue -breathing problems -signs and symptoms of low blood sugar such as feeling anxious, confusion, dizziness, increased hunger, unusually weak or tired, sweating, shakiness, cold, irritable, headache, blurred vision, fast heartbeat, loss of consciousness -swelling of the ankles, feet, hands -trouble passing urine or change in the amount of urine -unusual stomach pain or upset -unusually weak or tired -vomiting Side effects that usually do not require medical attention (report to your doctor or health care professional if they continue or are bothersome): -constipation -diarrhea -dizziness -headache -heartburn -nausea This list may not describe all possible side effects. Call your doctor for medical advice about side effects. You may report side effects to FDA at 1-800-FDA-1088. Where should I keep my medicine? Keep out of the reach of children. Store unopened pen in a refrigerator between 2 and 8 degrees C (36  and 46 degrees F). Do not freeze or use if the medicine has been frozen. Protect from light and excessive heat. After you first use the pen, it should be kept at a temperature not to exceed 25 degrees C (77 degrees F). Throw away your used pen after 30 days or after the expiration date, whichever comes first. Do not store your pen with the needle attached. If the needle is left on, medicine may leak from the pen or air bubbles may  form in the cartridge. NOTE: This sheet is a summary. It may not cover all possible information. If you have questions about this medicine, talk to your doctor, pharmacist, or health care provider.    2016, Elsevier/Gold Standard. (2013-08-08 10:22:05)

## 2015-07-21 NOTE — Telephone Encounter (Signed)
I do not feel comfortable signing a handicap form without assessing the patient's functional status. My suggestion would be for her to come into the clinic as soon as possible to see another physician so they can assess the patient and fill out the form. Thanks.

## 2015-07-21 NOTE — Telephone Encounter (Signed)
Thanks Rasheta. I agree with your plan. The patient has to come into the clinic with her meter and needs be seen by a physician as soon as possible.

## 2015-07-22 ENCOUNTER — Other Ambulatory Visit: Payer: Self-pay | Admitting: Internal Medicine

## 2015-07-22 NOTE — Assessment & Plan Note (Signed)
HPI: She has a history of chronic low back pain due to herniated lumbar disc.  She is precribed percoet 5-325 #90 pills per month.  She is requesting a refill today which has already been provided by PCP.  She reports she takes 3 pills on "bad days" and 0-1 pills on "good days"  A: Chronic low back pain  P: - Patient does have a history of cocaine abuse, I did review the [place] controlled substance database.  Per this record her last fill was 02/21/15, before that was 12/25/14, and 10/24/14.  She does not appear to need 90 pills per month from her statements as well as review of this system.  Furthermore I am concerned about the results of her last UDS which was negative for oxycodone but positive for tramadol.  I am not clear if the result is inappropriate on the basis of her oxycodone use (not documented last dose) but it does appear to be inappropriate on the basis of the presence of tramadol which was not prescribed here and is not in her [facility] record. - I will check a UDS today and expect the presence of oxycodone. -I will have her follow up in 1 month with Dr Marlowe Sax to sign and updated pain medication agreement and discuss her chronic pain needs.

## 2015-07-22 NOTE — Assessment & Plan Note (Signed)
HPI: Patient reports regimen of Novolog 70/30 75 units BID, Metformin 1G BID and januvia 100mg  daily.  Her sugars are consistently running in the high 200s to low 300s.    A: T2DM uncontrolled with diabetic neuropathy  P: - Continue current regimen and add byetta 12mcg BID, follow up in 1 month and consider increaseto 54mcg BID

## 2015-07-22 NOTE — Progress Notes (Signed)
[facility] Subjective:   Patient ID: [name] female   DOB: 1960-11-07 55 y.o.   MRN: [id number]  HPI: [name] is a 55 y.o. female with a PMH detailed below who presents with CC of hyperglycemia and wants changes to her diabetes regimen.  Please see problem based charting below for the status of her chronic medical conditions.    Past Medical History  Diagnosis Date  . Chronic back pain   . Hyperlipidemia   . Hypertension   . Uterine fibroid     s/p hysterectomy  . Cocaine abuse     in remission  . Tobacco abuse   . Chronic leg pain     due to back pain  . Diabetes ([place])     with neuropathy  . RECTAL BLEEDING 12/09/2008    Annotation: s/p EGD 7/08 mild gastritis, s/p colonoscopy 7/08- benign polyp  s/p polypectomy and isolated diverticulum.  Qualifier: Diagnosis of  By: Ditzler RN, Debra    . Sleep apnea    Current Outpatient Prescriptions  Medication Sig Dispense Refill  . aspirin 81 MG EC tablet Take 81 mg by mouth daily.      . Calcium Carbonate-Vitamin D (CALCIUM 600+D) 600-400 MG-UNIT per tablet Take 1 tablet by mouth daily.      . chlorpheniramine (CHLORPHEN) 4 MG tablet Take 1 tab in AM and 2 tabs in PM    . gabapentin (NEURONTIN) 300 MG capsule Take 2 capsules (600 mg total) by mouth at bedtime. 60 capsule 5  . hydrochlorothiazide (HYDRODIURIL) 25 MG tablet Take 1 tablet (25 mg total) by mouth daily. 90 tablet 3  . Insulin Isophane & Regular Human (HUMULIN 70/30 KWIKPEN) (70-30) 100 UNIT/ML PEN Inject 75 Units into the skin 2 (two) times daily. 45 mL 3  . metFORMIN (GLUCOPHAGE) 1000 MG tablet Take 1 tablet (1,000 mg total) by mouth 2 (two) times daily with a meal. 180 tablet 3  . Multiple Vitamin (MULTIVITAMIN WITH MINERALS) TABS tablet Take 1 tablet by mouth daily.    Marland Kitchen oxyCODONE-acetaminophen (PERCOCET/ROXICET) 5-325 MG tablet Take 1 tablet by mouth every 8 (eight) hours as needed. 90 tablet 0  . rosuvastatin (CRESTOR) 20 MG tablet  Take 1 tablet (20 mg total) by mouth at bedtime. 30 tablet 5  . sitaGLIPtin (JANUVIA) 100 MG tablet Take 100 mg by mouth daily.    Marland Kitchen acyclovir (ZOVIRAX) 400 MG tablet Take 1 tablet (400 mg total) by mouth 2 (two) times daily. (Patient not taking: Reported on 07/21/2015) 60 tablet 3  . albuterol (PROVENTIL HFA;VENTOLIN HFA) 108 (90 BASE) MCG/ACT inhaler Inhale 2 puffs into the lungs every 4 (four) hours as needed for wheezing or shortness of breath. (Patient not taking: Reported on 07/21/2015) 18 g 5  . budesonide-formoterol (SYMBICORT) 80-4.5 MCG/ACT inhaler Inhale 2 puffs into the lungs 2 (two) times daily. (Patient not taking: Reported on 07/21/2015) 1 Inhaler 12  . exenatide (BYETTA 5 MCG PEN) 5 MCG/0.02ML SOPN injection Inject 0.02 mLs (5 mcg total) into the skin 2 (two) times daily with a meal. 1.2 mL 3  . Insulin Pen Needle (B-D UF III MINI PEN NEEDLES) 31G X 5 MM MISC Use to inject insulin twice a day 100 each 5  . Insulin Syringe-Needle U-100 31G X 15/64" 1 ML MISC Use to give injections twice daily.  Dx code ICD-9 250.00. 100 each 3  . mometasone (NASONEX) 50 MCG/ACT nasal spray Place 2 sprays into the nose daily as needed. Reported  on 07/21/2015    . pantoprazole (PROTONIX) 40 MG tablet Take 40 mg by mouth daily. Reported on 07/21/2015    . [DISCONTINUED] esomeprazole (NEXIUM) 20 MG capsule Take 1 capsule (20 mg total) by mouth daily. 90 capsule 3  . [DISCONTINUED] simvastatin (ZOCOR) 40 MG tablet TAKE ONE TABLET BY MOUTH EVERY DAY 60 tablet 6   No current facility-administered medications for this visit.   Family History  Problem Relation Age of Onset  . Diabetes Father   . Heart disease Father   . Hypertension Father   . Diabetes Mother   . Cancer Mother     brain  . Hypertension Mother    Social History   Social History  . Marital Status: Married    Spouse Name: N/A  . Number of Children: N/A  . 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     Social History Main Topics  . Smoking status: Former Smoker -- 0.50 packs/day for 20 years    Types: Cigarettes    Quit date: 07/23/1998  . Smokeless tobacco: Never Used  . Alcohol Use: No     Comment: recovering addict clean for 5 years  . Drug Use: No     Comment: recovering addict clean for 5 years  . Sexual Activity: Not Asked   Other Topics Concern  . None   Social History Narrative   Unemployed but has started taking community college classes (interested in accounting) since having back surgery, which has improved pain and mobility. Hopes to return to work soon. Lives with husband and daughter.  Uninsured.   Review of Systems: Review of Systems  Constitutional: Negative for fever and chills.  Eyes: Negative for blurred vision.  Cardiovascular: Negative for chest pain.  Gastrointestinal: Negative for abdominal pain.  Endo/Heme/Allergies: Negative for polydipsia.  All other systems reviewed and are negative.    Objective:  Physical Exam: Filed Vitals:   07/21/15 1459  BP: 143/81  Pulse: 91  Temp: 97.9 F (36.6 C)  TempSrc: Oral  Height: 5\' 4"  (1.626 m)  Weight: 267 lb 1.6 oz (121.156 kg)  SpO2: 99%   Physical Exam  Constitutional: She is well-developed, well-nourished, and in no distress.  Cardiovascular: Normal rate and regular rhythm.   Pulmonary/Chest: Effort normal and breath sounds normal.  Musculoskeletal:       Lumbar back: She exhibits no tenderness and no bony tenderness.  Nursing note and vitals reviewed.    Assessment & Plan:  Case discussed with Dr. Dareen Piano  Essential hypertension HPI: Taking medication, denies side effects  A: Essential Hypertension, not at goal  P: - BP close to goal, previous well controlled.  Continue current medications and will adjust at next visit if needed  Type 2 diabetes mellitus with diabetic neuropathy HPI: Patient reports regimen of Novolog 70/30 75 units BID, Metformin 1G BID and januvia 100mg  daily.  Her  sugars are consistently running in the high 200s to low 300s.    A: T2DM uncontrolled with diabetic neuropathy  P: - Continue current regimen and add byetta 8mcg BID, follow up in 1 month and consider increaseto 95mcg BID  Back pain HPI: She has a history of chronic low back pain due to herniated lumbar disc.  She is precribed percoet 5-325 #90 pills per month.  She is requesting a refill today which has already been provided by PCP.  She reports she takes 3 pills on "bad days" and 0-1 pills on "good days"  A: Chronic low back pain  P: - Patient does have a history of cocaine abuse, I did review the [place] controlled substance database.  Per this record her last fill was 02/21/15, before that was 12/25/14, and 10/24/14.  She does not appear to need 90 pills per month from her statements as well as review of this system.  Furthermore I am concerned about the results of her last UDS which was negative for oxycodone but positive for tramadol.  I am not clear if the result is inappropriate on the basis of her oxycodone use (not documented last dose) but it does appear to be inappropriate on the basis of the presence of tramadol which was not prescribed here and is not in her [facility] record. - I will check a UDS today and expect the presence of oxycodone. -I will have her follow up in 1 month with Dr Marlowe Sax to sign and updated pain medication agreement and discuss her chronic pain needs.    Medications Ordered Meds ordered this encounter  Medications  . sitaGLIPtin (JANUVIA) 100 MG tablet    Sig: Take 100 mg by mouth daily.  Marland Kitchen exenatide (BYETTA 5 MCG PEN) 5 MCG/0.02ML SOPN injection    Sig: Inject 0.02 mLs (5 mcg total) into the skin 2 (two) times daily with a meal.    Dispense:  1.2 mL    Refill:  3   Other Orders Orders Placed This Encounter  Procedures  . Glucose, capillary  . ToxAssure Select,+Antidepr,UR   Follow Up: Return in about 1 month (around 08/18/2015).

## 2015-07-22 NOTE — Assessment & Plan Note (Signed)
HPI: Taking medication, denies side effects  A: Essential Hypertension, not at goal  P: - BP close to goal, previous well controlled.  Continue current medications and will adjust at next visit if needed

## 2015-07-22 NOTE — Telephone Encounter (Signed)
Pt requesting Protonix to be filled @ [facility] on [address]

## 2015-07-23 ENCOUNTER — Telehealth: Payer: Self-pay | Admitting: Internal Medicine

## 2015-07-23 MED ORDER — PANTOPRAZOLE SODIUM 40 MG PO TBEC
40.0000 mg | DELAYED_RELEASE_TABLET | Freq: Every day | ORAL | Status: DC
Start: 2015-07-23 — End: 2016-01-19

## 2015-07-23 NOTE — Progress Notes (Signed)
Internal Medicine Clinic Attending  Case discussed with Dr. Hoffman at the time of the visit.  We reviewed the resident's history and exam and pertinent patient test results.  I agree with the assessment, diagnosis, and plan of care documented in the resident's note.  

## 2015-07-23 NOTE — Telephone Encounter (Signed)
Pt states the pharmacy will not filled oxycodone because the Rx does not have the office information. Please call pt back.

## 2015-07-23 NOTE — Telephone Encounter (Signed)
corrected

## 2015-07-25 LAB — TOXASSURE SELECT,+ANTIDEPR,UR: PDF: 0

## 2015-08-10 ENCOUNTER — Other Ambulatory Visit: Payer: Self-pay | Admitting: Internal Medicine

## 2015-08-10 DIAGNOSIS — E1140 Type 2 diabetes mellitus with diabetic neuropathy, unspecified: Secondary | ICD-10-CM

## 2015-08-10 DIAGNOSIS — E1165 Type 2 diabetes mellitus with hyperglycemia: Principal | ICD-10-CM

## 2015-08-10 DIAGNOSIS — Z794 Long term (current) use of insulin: Secondary | ICD-10-CM

## 2015-08-10 DIAGNOSIS — IMO0002 Reserved for concepts with insufficient information to code with codable children: Secondary | ICD-10-CM

## 2015-08-10 NOTE — Telephone Encounter (Signed)
Pt states [employer] will not paid for the insulin. Please call pt back.

## 2015-08-12 NOTE — Telephone Encounter (Signed)
Call made to patient for further clarification as to why she was unable to fill insulin (needs refill vs needs PA), no answer, message left on recorder.[name]3/1/201710:34 AM     Call made to [facility] and I was informed that insulin will be ready for next refill on l March 12th, no PA needed, refill too soon error per pharmacy .[name], [name]3/1/201710:35 AM

## 2015-08-12 NOTE — Telephone Encounter (Addendum)
Received call back from patient-she stated that she had received paperwork from insurance company will no longer cover humulin 70/30 kwikpen.  Please see list of all preferred insulins below   Novolog Mix 70/30 Flexpen Suspension Pen-Injector (70-30) 100 Unit/Ml Subcutaneous Novolog Mix 70/30 Suspension (70-30) 100 Unit/Ml Subcutaneous  Toujeo Solostar Solution Pen-Injector 300 Unit/Ml Subcutaneous  Novolog Solution 100 Unit/Ml Subcutaneous Novolog Penfill Solution Cartridge 100 Unit/Ml Subcutaneous  Lantus Solostar Solution Pen-Injector 100 Unit/Ml Subcutaneous Lantus Solution 100 Unit/Ml Subcutaneous  Levemir Flextouch Solution Pen-Injector 100 Unit/Ml Subcutaneous Levemir Solution 100 Unit/Ml Subcutaneous

## 2015-08-14 MED ORDER — INSULIN ASPART PROT & ASPART (70-30 MIX) 100 UNIT/ML PEN: 75.0000 [IU] | PEN_INJECTOR | Freq: Two times a day (BID) | SUBCUTANEOUS | Status: DC

## 2015-08-14 NOTE — Addendum Note (Signed)
Addended by: Forde Dandy on: 08/14/2015 05:19 PM   Modules accepted: Orders, Medications

## 2015-08-17 NOTE — Telephone Encounter (Signed)
Resolved. Insurance would not fill the insulin until today and also the pharmacy was out of stock. Contacted patient and notified her okay to pick up from pharmacy this evening or tomorrow. Patient education provided on the Novolog 70/30 (esp. Take 15 min before meals rather than 30 min which is different from the Humulin 70/30). Advised patient to call if any other questions/concerns; patient verbalized understanding.

## 2015-08-19 DIAGNOSIS — E109 Type 1 diabetes mellitus without complications: Secondary | ICD-10-CM | POA: Diagnosis not present

## 2015-09-01 ENCOUNTER — Encounter: Payer: Medicare Other | Admitting: Internal Medicine

## 2015-09-04 ENCOUNTER — Other Ambulatory Visit: Payer: Self-pay | Admitting: Internal Medicine

## 2015-09-04 DIAGNOSIS — A6000 Herpesviral infection of urogenital system, unspecified: Secondary | ICD-10-CM

## 2015-09-04 NOTE — Telephone Encounter (Signed)
NEEDS MEDICATION REFILL- TO [facility] ID:[id number]

## 2015-09-07 ENCOUNTER — Encounter: Payer: Self-pay | Admitting: *Deleted

## 2015-09-07 MED ORDER — ACYCLOVIR 400 MG PO TABS: 400.0000 mg | ORAL_TABLET | Freq: Three times a day (TID) | ORAL | Status: DC

## 2015-09-16 ENCOUNTER — Other Ambulatory Visit: Payer: Self-pay | Admitting: Internal Medicine

## 2015-09-16 DIAGNOSIS — M549 Dorsalgia, unspecified: Secondary | ICD-10-CM

## 2015-09-16 MED ORDER — OXYCODONE-ACETAMINOPHEN 5-325 MG PO TABS: 1.0000 | ORAL_TABLET | Freq: Three times a day (TID) | ORAL | Status: DC | PRN

## 2015-09-16 NOTE — Telephone Encounter (Signed)
Needs pain med refill °

## 2015-09-16 NOTE — Telephone Encounter (Signed)
Last office visit: 07/21/2015 w/Hoffman Last UDS: 07/21/2015 Last Refill:  per [place] last refill 07/23/2015 Next appt: 10/20/2015

## 2015-09-17 NOTE — Telephone Encounter (Signed)
informed

## 2015-09-24 ENCOUNTER — Encounter: Payer: Medicare Other | Admitting: Internal Medicine

## 2015-10-19 ENCOUNTER — Telehealth: Payer: Self-pay | Admitting: Internal Medicine

## 2015-10-19 DIAGNOSIS — H538 Other visual disturbances: Secondary | ICD-10-CM | POA: Diagnosis not present

## 2015-10-19 DIAGNOSIS — E119 Type 2 diabetes mellitus without complications: Secondary | ICD-10-CM | POA: Diagnosis not present

## 2015-10-19 DIAGNOSIS — E1159 Type 2 diabetes mellitus with other circulatory complications: Secondary | ICD-10-CM | POA: Diagnosis not present

## 2015-10-19 NOTE — Telephone Encounter (Signed)
APT. REMINDER CALL, LMTCB °

## 2015-10-20 ENCOUNTER — Ambulatory Visit (INDEPENDENT_AMBULATORY_CARE_PROVIDER_SITE_OTHER): Payer: PPO | Admitting: Internal Medicine

## 2015-10-20 ENCOUNTER — Encounter: Payer: Self-pay | Admitting: Internal Medicine

## 2015-10-20 VITALS — BP 129/73 | HR 84 | Temp 97.7°F | Ht 64.0 in | Wt 262.5 lb

## 2015-10-20 DIAGNOSIS — Z794 Long term (current) use of insulin: Secondary | ICD-10-CM | POA: Diagnosis not present

## 2015-10-20 DIAGNOSIS — E1140 Type 2 diabetes mellitus with diabetic neuropathy, unspecified: Secondary | ICD-10-CM | POA: Diagnosis not present

## 2015-10-20 DIAGNOSIS — M549 Dorsalgia, unspecified: Secondary | ICD-10-CM

## 2015-10-20 DIAGNOSIS — E1149 Type 2 diabetes mellitus with other diabetic neurological complication: Secondary | ICD-10-CM

## 2015-10-20 DIAGNOSIS — G8929 Other chronic pain: Secondary | ICD-10-CM

## 2015-10-20 DIAGNOSIS — M545 Low back pain: Secondary | ICD-10-CM

## 2015-10-20 DIAGNOSIS — M7989 Other specified soft tissue disorders: Secondary | ICD-10-CM | POA: Diagnosis not present

## 2015-10-20 DIAGNOSIS — I10 Essential (primary) hypertension: Secondary | ICD-10-CM | POA: Diagnosis not present

## 2015-10-20 DIAGNOSIS — L709 Acne, unspecified: Secondary | ICD-10-CM

## 2015-10-20 DIAGNOSIS — M5126 Other intervertebral disc displacement, lumbar region: Secondary | ICD-10-CM

## 2015-10-20 DIAGNOSIS — D179 Benign lipomatous neoplasm, unspecified: Secondary | ICD-10-CM

## 2015-10-20 LAB — GLUCOSE, CAPILLARY: GLUCOSE-CAPILLARY: 162 mg/dL — AB (ref 65–99)

## 2015-10-20 LAB — POCT GLYCOSYLATED HEMOGLOBIN (HGB A1C): Hemoglobin A1C: 8.6

## 2015-10-20 MED ORDER — OXYCODONE-ACETAMINOPHEN 5-325 MG PO TABS: 1.0000 | ORAL_TABLET | Freq: Three times a day (TID) | ORAL | Status: DC | PRN

## 2015-10-20 MED ORDER — EXENATIDE 10 MCG/0.04ML ~~LOC~~ SOPN
10.0000 ug | PEN_INJECTOR | Freq: Two times a day (BID) | SUBCUTANEOUS | Status: DC
Start: 2015-10-20 — End: 2016-01-04

## 2015-10-20 NOTE — Patient Instructions (Addendum)
Byetta has been increased to 10 mcg twice daily. Use as instructed.  Use the spacer with your inhalers.  Make sure you go for the ultrasound of your left thigh.  Return for a follow-up visit in 3 months.

## 2015-10-22 DIAGNOSIS — L709 Acne, unspecified: Secondary | ICD-10-CM | POA: Insufficient documentation

## 2015-10-22 NOTE — Assessment & Plan Note (Signed)
BP Readings from Last 3 Encounters:  10/20/15 129/73  07/21/15 143/81  05/19/15 127/67    Lab Results  Component Value Date   NA 142 02/17/2015   K 3.8 02/17/2015   CREATININE 0.63 02/17/2015    Assessment: Blood pressure control:  well-controlled Progress toward BP goal:   at goal (below 140/90) Comments: Patient is currently taking hydrochlorothiazide 25 mg daily.  Plan: Medications:  continue current medications Educational resources provided:  encouraged healthy eating and exercise

## 2015-10-22 NOTE — Assessment & Plan Note (Addendum)
Assessment: Patient states the lipoma on her right thigh has resolved but she now believes she has a new one on the left thigh. However, no mass/nodule was felt on palpation of the left thigh on exam. She did have a 1 x 1.5 cm slightly firm, nontender nodule on the dorsal aspect of her right forearm. Denies having any associated joint problems, fevers, or chills. States the nodule on her forearm is mildly tender to palpation on occasion and has stayed stable in size.  Plan: -Ultrasound of soft tissue the left thigh -Reassess at next visit

## 2015-10-22 NOTE — Assessment & Plan Note (Addendum)
Lab Results  Component Value Date   HGBA1C 8.6 10/20/2015   HGBA1C 9.3 05/19/2015   HGBA1C 8.1 02/17/2015     Assessment: Progress toward A1C goal:   deteriorated (goal A1c less than 7) Comments: She is currently taking NovoLog 70/3075 units twice daily, metformin 1000 mg twice daily, Januvia 100 mg daily, and Byetta 5 mcg twice daily. States she likes eating chocolate but also has been walking more. She has lost 5 pounds in the past 3 months. Blood glucose meter showing an average value of 190, high 299, and low 69. Patient denies having any symptoms of hypoglycemia. She has been checking her blood glucose only once or twice daily.  Plan: Medications: Increase Byetta to 10 MCG twice daily. Continue other medications listed above. Home glucose monitoring: Frequency:  at least 3 times a day Timing:  before meals Instruction/counseling given: reminded to get eye exam, reminded to bring blood glucose meter & log to each visit, reminded to bring medications to each visit, discussed foot care, discussed the need for weight loss and discussed diet Self management tools provided: copy of home glucose meter download Other plans:  -Repeat A1c in 3 months -Referral to diabetes educator to help patient manage her diet

## 2015-10-22 NOTE — Progress Notes (Signed)
Patient ID: [name], female   DOB: 1961/03/17, 55 y.o.   MRN: [id number]   Subjective:   Patient ID: [name] female   DOB: 04-04-1961 55 y.o.   MRN: [id number]  HPI: [name] is a 55 y.o. female with a past medical history of conditions listed below presenting to the clinic to discuss her chronic lower back pain, diabetes, lipomas, and hypertension. Please refer to the assessment and plan section for the status of the patient's chronic medical conditions.    Past Medical History  Diagnosis Date  . Chronic back pain   . Hyperlipidemia   . Hypertension   . Uterine fibroid     s/p hysterectomy  . Cocaine abuse     in remission  . Tobacco abuse   . Chronic leg pain     due to back pain  . Diabetes ([place])     with neuropathy  . RECTAL BLEEDING 12/09/2008    Annotation: s/p EGD 7/08 mild gastritis, s/p colonoscopy 7/08- benign polyp  s/p polypectomy and isolated diverticulum.  Qualifier: Diagnosis of  By: Ditzler RN, Debra    . Sleep apnea    Current Outpatient Prescriptions  Medication Sig Dispense Refill  . acyclovir (ZOVIRAX) 400 MG tablet Take 1 tablet (400 mg total) by mouth 3 (three) times daily. Take medication for a total of 5 days. 15 tablet 0  . albuterol (PROVENTIL HFA;VENTOLIN HFA) 108 (90 BASE) MCG/ACT inhaler Inhale 2 puffs into the lungs every 4 (four) hours as needed for wheezing or shortness of breath. (Patient not taking: Reported on 07/21/2015) 18 g 5  . aspirin 81 MG EC tablet Take 81 mg by mouth daily.      . budesonide-formoterol (SYMBICORT) 80-4.5 MCG/ACT inhaler Inhale 2 puffs into the lungs 2 (two) times daily. (Patient not taking: Reported on 07/21/2015) 1 Inhaler 12  . Calcium Carbonate-Vitamin D (CALCIUM 600+D) 600-400 MG-UNIT per tablet Take 1 tablet by mouth daily.      . chlorpheniramine (CHLORPHEN) 4 MG tablet Take 1 tab in AM and 2 tabs in PM    . exenatide (BYETTA) 10 MCG/0.04ML SOPN injection Inject 0.04 mLs (10 mcg total) into the skin 2 (two)  times daily with a meal. 2.4 mL 3  . gabapentin (NEURONTIN) 300 MG capsule Take 2 capsules (600 mg total) by mouth at bedtime. 60 capsule 5  . hydrochlorothiazide (HYDRODIURIL) 25 MG tablet Take 1 tablet (25 mg total) by mouth daily. 90 tablet 3  . insulin aspart protamine - aspart (NOVOLOG MIX 70/30 FLEXPEN) (70-30) 100 UNIT/ML FlexPen Inject 0.75 mLs (75 Units total) into the skin 2 (two) times daily. VOID RX FOR HUMULIN 70/30 45 mL 3  . Insulin Pen Needle (B-D UF III MINI PEN NEEDLES) 31G X 5 MM MISC Use to inject insulin twice a day 100 each 5  . Insulin Syringe-Needle U-100 31G X 15/64" 1 ML MISC Use to give injections twice daily.  Dx code ICD-9 250.00. 100 each 3  . metFORMIN (GLUCOPHAGE) 1000 MG tablet Take 1 tablet (1,000 mg total) by mouth 2 (two) times daily with a meal. 180 tablet 3  . mometasone (NASONEX) 50 MCG/ACT nasal spray Place 2 sprays into the nose daily as needed. Reported on 07/21/2015    . Multiple Vitamin (MULTIVITAMIN WITH MINERALS) TABS tablet Take 1 tablet by mouth daily.    Marland Kitchen oxyCODONE-acetaminophen (PERCOCET/ROXICET) 5-325 MG tablet Take 1 tablet by mouth every 8 (eight) hours as needed. 50 tablet 0  . pantoprazole (  PROTONIX) 40 MG tablet Take 1 tablet (40 mg total) by mouth daily. Reported on 07/21/2015 90 tablet 0  . rosuvastatin (CRESTOR) 20 MG tablet Take 1 tablet (20 mg total) by mouth at bedtime. 30 tablet 5  . sitaGLIPtin (JANUVIA) 100 MG tablet Take 100 mg by mouth daily.    . [DISCONTINUED] esomeprazole (NEXIUM) 20 MG capsule Take 1 capsule (20 mg total) by mouth daily. 90 capsule 3  . [DISCONTINUED] simvastatin (ZOCOR) 40 MG tablet TAKE ONE TABLET BY MOUTH EVERY DAY 60 tablet 6   No current facility-administered medications for this visit.   Family History  Problem Relation Age of Onset  . Diabetes Father   . Heart disease Father   . Hypertension Father   . Diabetes Mother   . Cancer Mother     brain  . Hypertension Mother    Social History   Social  History  . Marital Status: Married    Spouse Name: N/A  . Number of Children: N/A  . 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    Social History Main Topics  . Smoking status: Former Smoker -- 0.50 packs/day for 20 years    Types: Cigarettes    Quit date: 07/23/1998  . Smokeless tobacco: Never Used  . Alcohol Use: No     Comment: recovering addict clean for 5 years  . Drug Use: No     Comment: recovering addict clean for 5 years  . Sexual Activity: Not Asked   Other Topics Concern  . None   Social History Narrative   Unemployed but has started taking community college classes (interested in accounting) since having back surgery, which has improved pain and mobility. Hopes to return to work soon. Lives with husband and daughter.  Uninsured.   Review of Systems: Review of Systems  Constitutional: Negative for fever, chills and malaise/fatigue.  HENT: Negative for congestion and sore throat.   Eyes: Negative for blurred vision and pain.  Respiratory: Negative for cough, shortness of breath and wheezing.   Cardiovascular: Negative for chest pain, palpitations and leg swelling.  Gastrointestinal: Negative for nausea, vomiting, abdominal pain and diarrhea.  Genitourinary: Negative for dysuria and flank pain.  Musculoskeletal: Negative for myalgias.       Chronic lower back pain  Skin: Positive for rash. Negative for itching.       Rash around mouth  Neurological: Negative for dizziness, tingling, sensory change, focal weakness and headaches.   Objective:  Physical Exam: Filed Vitals:   10/20/15 0909  BP: 129/73  Pulse: 84  Temp: 97.7 F (36.5 C)  TempSrc: Oral  Height: 5\' 4"  (1.626 m)  Weight: 262 lb 8 oz (119.069 kg)  SpO2: 98%   Physical Exam  Constitutional: She is oriented to person, place, and time. She appears well-developed and well-nourished. No distress.  HENT:  Head: Normocephalic and atraumatic.  Mouth/Throat:  Oropharynx is clear and moist. No oropharyngeal exudate.  Eyes: EOM are normal. Pupils are equal, round, and reactive to light.  Neck: Normal range of motion. Neck supple. No tracheal deviation present.  No axillary or cervical lymphadenopathy  Cardiovascular: Normal rate, regular rhythm and intact distal pulses.  Exam reveals no gallop and no friction rub.   No murmur heard. Pulmonary/Chest: Effort normal and breath sounds normal. No respiratory distress. She has no wheezes. She has no rales.  Abdominal: Soft. Bowel sounds are normal. She exhibits no distension. There is no tenderness.  Musculoskeletal: She  exhibits no edema.  Lumbar spine tender to palpation. Straight leg raise test positive at 30 on the left and 45 on the right.  Neurological: She is alert and oriented to person, place, and time.  Skin: Skin is warm and dry. She is not diaphoretic. No erythema.  Perioral acne 1 x 1.5 cm slightly firm, non-tender nodule palpated on the dorsal aspect of the right forearm. No erythema, increased warmth, or drainage.   Assessment & Plan:

## 2015-10-22 NOTE — Assessment & Plan Note (Signed)
Assessment: Patient noted to have perioral acne. Likely due to inhaled corticosteroid use for her COPD. States she first noticed tiny bumps around her mouth about a month ago and has been using rubbing alcohol on the area. Denies having any fevers or chills.  Plan: -Advised patient to stop using rubbing alcohol. Instead, suggested she use face wash and lotion designed for sensitive skin. -Gave her a spacer to use with her inhalers

## 2015-10-22 NOTE — Assessment & Plan Note (Signed)
Assessment: Patient has a history of chronic lower back pain due to herniated lumbar disc. Reports having lower back pain which radiates to her buttocks and sides of legs bilaterally. Denies having any focal weakness, numbness, or tingling. Denies having any saddle anesthesia. Denies having any bowel or bladder incontinence. Noted to have pain on palpation of lumbar spine on exam. Straight leg raise test positive at 30 on the left and 45 on the right. Patient states Percocet helps her function better and controls her pain. She is currently on Percocet 5-325 mg 1 tablet q8 as needed, #90 per month. Reports taking 3 pills on "bad days" and no pills on "good days." As such, patient does not require 90 tablets in a month. UDS done during her previous visit in 07/2015 was appropriate (positive for oxycodone and its metabolites). I discussed cutting down the number of tablets to #50/ month based on her needs and patient agreed with the plan. I also discussed starting her on Celebrex but patient refused stating she has tried the medication before and it does not work for her. She did not seem to be interested in taking any other medication.  Plan: -From now on, patient will be prescribed Percocet 5-325 mg 1 tablet every 8 as needed, #50 per month. I did give her a new prescription at this visit. Records show patient last received #90 tablets on April 6. As such, she should have enough medication to last her until the end of June.

## 2015-10-23 NOTE — Progress Notes (Signed)
Case discussed with Dr. Rathore at the time of the visit.  We reviewed the resident's history and exam and pertinent patient test results.  I agree with the assessment, diagnosis, and plan of care documented in the resident's note. 

## 2015-11-06 ENCOUNTER — Other Ambulatory Visit: Payer: Self-pay | Admitting: Internal Medicine

## 2015-11-06 ENCOUNTER — Ambulatory Visit (HOSPITAL_COMMUNITY)
Admission: RE | Admit: 2015-11-06 | Discharge: 2015-11-06 | Disposition: A | Discharge status: Home / Self Care | Payer: PPO | Source: Ambulatory Visit | Attending: Internal Medicine | Admitting: Internal Medicine

## 2015-11-06 DIAGNOSIS — D179 Benign lipomatous neoplasm, unspecified: Secondary | ICD-10-CM | POA: Diagnosis not present

## 2015-11-06 DIAGNOSIS — R2242 Localized swelling, mass and lump, left lower limb: Secondary | ICD-10-CM | POA: Diagnosis not present

## 2015-11-20 NOTE — Addendum Note (Signed)
Addended by: Shela Leff on: 11/20/2015 06:12 PM   Modules accepted: Orders

## 2015-12-23 ENCOUNTER — Other Ambulatory Visit: Payer: Self-pay | Admitting: Internal Medicine

## 2015-12-23 DIAGNOSIS — E1140 Type 2 diabetes mellitus with diabetic neuropathy, unspecified: Secondary | ICD-10-CM

## 2015-12-23 DIAGNOSIS — Z794 Long term (current) use of insulin: Secondary | ICD-10-CM

## 2015-12-23 DIAGNOSIS — M549 Dorsalgia, unspecified: Secondary | ICD-10-CM

## 2015-12-23 NOTE — Telephone Encounter (Signed)
Last visit: 10/20/2015 Next appointment: 01/19/2016 Last UDS: 07/21/2015 Last written: 10/20/2015

## 2015-12-23 NOTE — Telephone Encounter (Signed)
oxyCODONE-acetaminophen (PERCOCET/ROXICET) 5-325 MG tablet  

## 2015-12-28 ENCOUNTER — Other Ambulatory Visit: Payer: Self-pay | Admitting: Internal Medicine

## 2015-12-28 MED ORDER — OXYCODONE-ACETAMINOPHEN 5-325 MG PO TABS: 1.0000 | ORAL_TABLET | Freq: Three times a day (TID) | ORAL | Status: DC | PRN

## 2015-12-28 NOTE — Telephone Encounter (Signed)
This has been answered, closing this enc

## 2015-12-28 NOTE — Telephone Encounter (Deleted)
Last script 5/9 Next appt 8/8 Last uds 2/7

## 2015-12-28 NOTE — Telephone Encounter (Signed)
Requesting pain med refill

## 2015-12-29 NOTE — Telephone Encounter (Signed)
Pt informed

## 2016-01-03 ENCOUNTER — Other Ambulatory Visit: Payer: Self-pay | Admitting: Internal Medicine

## 2016-01-03 DIAGNOSIS — E1140 Type 2 diabetes mellitus with diabetic neuropathy, unspecified: Secondary | ICD-10-CM

## 2016-01-03 DIAGNOSIS — E1165 Type 2 diabetes mellitus with hyperglycemia: Secondary | ICD-10-CM

## 2016-01-03 DIAGNOSIS — IMO0002 Reserved for concepts with insufficient information to code with codable children: Secondary | ICD-10-CM

## 2016-01-04 ENCOUNTER — Other Ambulatory Visit: Payer: Self-pay | Admitting: *Deleted

## 2016-01-04 ENCOUNTER — Telehealth: Payer: Self-pay | Admitting: Internal Medicine

## 2016-01-04 DIAGNOSIS — Z794 Long term (current) use of insulin: Secondary | ICD-10-CM

## 2016-01-04 DIAGNOSIS — E1165 Type 2 diabetes mellitus with hyperglycemia: Principal | ICD-10-CM

## 2016-01-04 DIAGNOSIS — E785 Hyperlipidemia, unspecified: Secondary | ICD-10-CM

## 2016-01-04 DIAGNOSIS — E1140 Type 2 diabetes mellitus with diabetic neuropathy, unspecified: Secondary | ICD-10-CM

## 2016-01-04 DIAGNOSIS — IMO0002 Reserved for concepts with insufficient information to code with codable children: Secondary | ICD-10-CM

## 2016-01-04 DIAGNOSIS — I10 Essential (primary) hypertension: Secondary | ICD-10-CM

## 2016-01-04 MED ORDER — "INSULIN SYRINGE-NEEDLE U-100 31G X 15/64"" 1 ML MISC": 3 refills | Status: DC

## 2016-01-04 MED ORDER — HYDROCHLOROTHIAZIDE 25 MG PO TABS: 25.0000 mg | ORAL_TABLET | Freq: Every day | ORAL | 3 refills | Status: DC

## 2016-01-04 MED ORDER — SITAGLIPTIN PHOSPHATE 100 MG PO TABS: 100.0000 mg | ORAL_TABLET | Freq: Every day | ORAL | 3 refills | Status: DC

## 2016-01-04 MED ORDER — INSULIN ASPART PROT & ASPART (70-30 MIX) 100 UNIT/ML PEN: 75.0000 [IU] | PEN_INJECTOR | Freq: Two times a day (BID) | SUBCUTANEOUS | 6 refills | Status: DC

## 2016-01-04 MED ORDER — EXENATIDE 10 MCG/0.04ML ~~LOC~~ SOPN: 10.0000 ug | PEN_INJECTOR | Freq: Two times a day (BID) | SUBCUTANEOUS | 6 refills | Status: DC

## 2016-01-04 MED ORDER — ROSUVASTATIN CALCIUM 20 MG PO TABS: 20.0000 mg | ORAL_TABLET | Freq: Every day | ORAL | 3 refills | Status: DC

## 2016-01-04 MED ORDER — INSULIN PEN NEEDLE 31G X 5 MM MISC: 3 refills | Status: DC

## 2016-01-04 NOTE — Telephone Encounter (Signed)
hydrochlorothiazide (HYDRODIURIL) 25 MG tablet insulin aspart protamine - aspart (NOVOLOG MIX 70/30 FLEXPEN) (70-30) 100 UNIT/ML FlexPen sitaGLIPtin (JANUVIA) 100 MG tablet exenatide (BYETTA) 10 MCG/0.04ML SOPN injection rosuvastatin (CRESTOR) 20 MG tablet Also needs the needles for the insulin [facility] [place]

## 2016-01-05 NOTE — Telephone Encounter (Signed)
done

## 2016-01-12 ENCOUNTER — Other Ambulatory Visit: Payer: Self-pay | Admitting: Internal Medicine

## 2016-01-12 DIAGNOSIS — Z1231 Encounter for screening mammogram for malignant neoplasm of breast: Secondary | ICD-10-CM

## 2016-01-18 ENCOUNTER — Telehealth: Payer: Self-pay | Admitting: Internal Medicine

## 2016-01-18 ENCOUNTER — Encounter: Payer: Self-pay | Admitting: Internal Medicine

## 2016-01-18 NOTE — Telephone Encounter (Signed)
APT. REMINDER CALL, LMTCB °

## 2016-01-19 ENCOUNTER — Ambulatory Visit (INDEPENDENT_AMBULATORY_CARE_PROVIDER_SITE_OTHER): Payer: PPO | Admitting: Internal Medicine

## 2016-01-19 ENCOUNTER — Encounter: Payer: Self-pay | Admitting: Internal Medicine

## 2016-01-19 VITALS — BP 134/63 | HR 88 | Temp 98.3°F | Ht 64.0 in | Wt 265.3 lb

## 2016-01-19 DIAGNOSIS — E1140 Type 2 diabetes mellitus with diabetic neuropathy, unspecified: Secondary | ICD-10-CM | POA: Diagnosis not present

## 2016-01-19 DIAGNOSIS — E1165 Type 2 diabetes mellitus with hyperglycemia: Secondary | ICD-10-CM

## 2016-01-19 DIAGNOSIS — N6452 Nipple discharge: Secondary | ICD-10-CM

## 2016-01-19 DIAGNOSIS — R200 Anesthesia of skin: Secondary | ICD-10-CM

## 2016-01-19 DIAGNOSIS — Z79899 Other long term (current) drug therapy: Secondary | ICD-10-CM

## 2016-01-19 DIAGNOSIS — R202 Paresthesia of skin: Secondary | ICD-10-CM | POA: Diagnosis not present

## 2016-01-19 DIAGNOSIS — E1149 Type 2 diabetes mellitus with other diabetic neurological complication: Secondary | ICD-10-CM

## 2016-01-19 DIAGNOSIS — N649 Disorder of breast, unspecified: Secondary | ICD-10-CM

## 2016-01-19 DIAGNOSIS — Z794 Long term (current) use of insulin: Secondary | ICD-10-CM | POA: Diagnosis not present

## 2016-01-19 DIAGNOSIS — I10 Essential (primary) hypertension: Secondary | ICD-10-CM

## 2016-01-19 DIAGNOSIS — Z803 Family history of malignant neoplasm of breast: Secondary | ICD-10-CM

## 2016-01-19 DIAGNOSIS — R1031 Right lower quadrant pain: Secondary | ICD-10-CM

## 2016-01-19 DIAGNOSIS — K219 Gastro-esophageal reflux disease without esophagitis: Secondary | ICD-10-CM

## 2016-01-19 DIAGNOSIS — Z90711 Acquired absence of uterus with remaining cervical stump: Secondary | ICD-10-CM

## 2016-01-19 LAB — GLUCOSE, CAPILLARY: GLUCOSE-CAPILLARY: 153 mg/dL — AB (ref 65–99)

## 2016-01-19 LAB — POCT GLYCOSYLATED HEMOGLOBIN (HGB A1C): Hemoglobin A1C: 8.6

## 2016-01-19 MED ORDER — GABAPENTIN 300 MG PO CAPS: 600.0000 mg | ORAL_CAPSULE | Freq: Every day | ORAL | 3 refills | Status: DC

## 2016-01-19 MED ORDER — INSULIN ASPART PROT & ASPART (70-30 MIX) 100 UNIT/ML PEN: 80.0000 [IU] | PEN_INJECTOR | Freq: Two times a day (BID) | SUBCUTANEOUS | 6 refills | Status: DC

## 2016-01-19 MED ORDER — PANTOPRAZOLE SODIUM 40 MG PO TBEC: 40.0000 mg | DELAYED_RELEASE_TABLET | Freq: Every day | ORAL | 1 refills | Status: DC

## 2016-01-19 NOTE — Patient Instructions (Signed)
-  Novolog 70/30 mix: use 80 units twice daily  -Continue using Metformin and Byetta as before   -Check your blood sugar 3 times a day and bring your meter with you to the next visit.   -Make sure you go for your mammogram as soon as possible.

## 2016-01-20 ENCOUNTER — Ambulatory Visit
Admission: RE | Admit: 2016-01-20 | Discharge: 2016-01-20 | Disposition: A | Discharge status: Home / Self Care | Payer: PPO | Source: Ambulatory Visit | Attending: Internal Medicine | Admitting: Internal Medicine

## 2016-01-20 ENCOUNTER — Other Ambulatory Visit: Payer: Self-pay | Admitting: Internal Medicine

## 2016-01-20 DIAGNOSIS — N63 Unspecified lump in breast: Secondary | ICD-10-CM | POA: Diagnosis not present

## 2016-01-20 DIAGNOSIS — R200 Anesthesia of skin: Secondary | ICD-10-CM | POA: Insufficient documentation

## 2016-01-20 DIAGNOSIS — R1031 Right lower quadrant pain: Secondary | ICD-10-CM | POA: Insufficient documentation

## 2016-01-20 DIAGNOSIS — N6320 Unspecified lump in the left breast, unspecified quadrant: Secondary | ICD-10-CM

## 2016-01-20 DIAGNOSIS — N6452 Nipple discharge: Secondary | ICD-10-CM | POA: Insufficient documentation

## 2016-01-20 NOTE — Progress Notes (Signed)
   CC: "leakage from left breast" and numbness of right lower leg   HPI:  [name] is a 55 y.o. F with a PMHx of conditions listed below presenting to the clinic to discuss drainage of fluid from her L breast (nipple) and numbness of her right lower leg. She also discussed her chronic medical conditions including HTN, DM2, and diabetic neuropathy during this visit. Please see problem based charting for the status of the patient's current and chronic medical conditions.    Past Medical History:  Diagnosis Date  . Chronic back pain   . Chronic leg pain    due to back pain  . Cocaine abuse    in remission  . Diabetes ([place])    with neuropathy  . Hyperlipidemia   . Hypertension   . RECTAL BLEEDING 12/09/2008   Annotation: s/p EGD 7/08 mild gastritis, s/p colonoscopy 7/08- benign polyp  s/p polypectomy and isolated diverticulum.  Qualifier: Diagnosis of  By: Ditzler RN, Debra    . Sleep apnea   . Tobacco abuse   . Uterine fibroid    s/p hysterectomy    Review of Systems:  Pertinent positives mentioned in HPI. Remainder of all ROS negative.   Physical Exam:  Vitals:   01/19/16 1414  BP: 134/63  Pulse: 88  Temp: 98.3 F (36.8 C)  TempSrc: Oral  SpO2: 99%  Weight: 265 lb 4.8 oz (120.3 kg)  Height: 5\' 4"  (1.626 m)   Physical Exam  Constitutional: She is oriented to person, place, and time. She appears well-developed and well-nourished. No distress.  HENT:  Head: Normocephalic and atraumatic.  Eyes: EOM are normal. Pupils are equal, round, and reactive to light.  Neck: Neck supple.  Cardiovascular: Normal rate, regular rhythm and intact distal pulses.   Pulmonary/Chest: Effort normal and breath sounds normal. No respiratory distress. She has no wheezes. She has no rales.  Abdominal: Soft. Bowel sounds are normal. She exhibits no distension.  Epigastric region mildly tender to palpation. No rebound, guarding, or rigidity.   Musculoskeletal: Normal range of motion. She  exhibits no edema or deformity.  Lymphadenopathy:    She has no cervical adenopathy.  Neurological: She is alert and oriented to person, place, and time.  Strength and sensation grossly intact in bilateral lower extremities. No foot drop.   Skin: Skin is warm and dry.  Bilateral breast examination: No masses palpated. No drainage noted from nipples.  No axillary lymphadenopathy appreciated bilaterally.     Assessment & Plan:   See Encounters Tab for problem based charting.  Patient discussed with Dr. Beryle Beams

## 2016-01-20 NOTE — Assessment & Plan Note (Signed)
BP Readings from Last 3 Encounters:  01/19/16 134/63  10/20/15 129/73  07/21/15 (!) 143/81    Lab Results  Component Value Date   NA 142 02/17/2015   K 3.8 02/17/2015   CREATININE 0.63 02/17/2015    Assessment: Blood pressure control:  well controlled (below goal <140/90) Comments: Currently on HCTZ 25 mg daily.   Plan: Medications:  continue current medications Educational resources provided:   Educated patient about healthy eating and exercise. Emphasized the importance of weight loss.

## 2016-01-20 NOTE — Assessment & Plan Note (Signed)
A Stable. Patient has no complaints. Taking Gabapentin 600 mg QHS and requesting refill.  P -Refill Gabapentin

## 2016-01-20 NOTE — Assessment & Plan Note (Signed)
HPI Reports having intermittent RLQ abdominal pain for the past few weeks. Patient states she has a partial hysterectomy done in 2010 and was concerned so she already made an appointment with Ob/Gyn Dr. Everett Graff for 03/10/16. She denies noticing any vaginal discharge. Denies having any GI symptoms suach as nausea, vomiting, loss of appetite, weight loss, diarrhea, or constipation. However, does report having GERD symptoms.   A/P She had mild epigastric tenderness on exam. No tenderness on palpation of the lower quadrants of the abdomen.  -Protonix for GERD -Advised her to go to her appointment with her Ob/Gyn

## 2016-01-20 NOTE — Assessment & Plan Note (Addendum)
HPI Patient reports noticing discharge from her L breast (nipple) 2 weeks ago. States initially the drainage was bloody; subsequently became serous. Per patient, symptoms resolved within a week. Denies any trauma to the breast area. Denies having any pain. Denies having any prior history of similar symptoms. Denies noticing any lumps on her breasts, however, does report noticing a "small bump" on her neck 2 weeks ago which went away. She is concerned about her symptoms because her sister was diagnosed with breast cancer at age 36.   A No nipple drainage noted on exam. In addition, no masses or axillary/ cervical lymphadenopathy appreciated. However, patient's current presentation is concerning for possible malignancy because she does risk factors including gender, age, and family history.   P -STAT referral for diagnostic mammogram of both breasts  -STAT ultrasound of L breast and axilla   Addendum: Mammo and ultrasound done 01/20/16 showing an oval hypoechoic mass within the left breast at the 12 o'clock axis, retroareolar, superficial, measuring 0.8 x 0.3 x 0.7 cm, with internal vascularity, most likely intraductal.  I spoke to the patient over the phone 01/20/16 at 1:34 pm and discussed the findings with her and recommended an ultrasound-guided biopsy. Patient told me she has already been scheduled for the biopsy on 01/22/16.  -Will follow-up results   Addendum (01/26/16): Core biopsy showing benign breast tissue with focal portion of large duct. I spoke to the patient over the phone on 01/26/16 at 5:40 pm to discuss the biopsy result. Patient told me Dr. Radford Pax from the breast center has referred her to surgery for further workup of her lymph nodes. Told me she has an appointment with [facility] tomorrow (01/27/16 at 10 am). I advised her to go to the appointment and patient agreed with the plan.    Addendum (02/05/16): Pathology revealed benign breast tissue with focal portion of large  duct of the Left breast at the 12:00 o'clock location, retroareolar. There were no papillomatous fragments identified. This was found to be discordant by Dr. Ammie Ferrier and surgical consultation was arranged per pathology report.  I tried calling patient but could not reach her; left VM asking her to call back. Per chart review, she is scheduled for a L. radioactive seed guided excisional breast biopsy by Dr. Lucia Gaskins on 02/18/16.   Addendum (02/05/16 at 5:47 pm): Called patient again and was able to reach her over the phone this time. Patient stated she went to see Dr. Lucia Gaskins last week on August 16th and was told the biopsy and mammogram did not give him enough information to determine whether this is malignancy. Patient confirmed that she is scheduled for a scheduled for a L. radioactive seed guided excisional breast biopsy on 02/18/16. -Our office will try to obtain records from Dr. Pollie Friar office.

## 2016-01-20 NOTE — Assessment & Plan Note (Signed)
Lab Results  Component Value Date   HGBA1C 8.6 01/19/2016   HGBA1C 8.6 10/20/2015   HGBA1C 9.3 05/19/2015     Assessment: Diabetes control:  uncontrolled (above goal A1c <7) Comments: Currently on Metformin 1000 mg BID, Exenatide 10 mcg BID, and Novolog 70/30 mix 75u BID. Meter showing average in 200s, checking 1-3 times daily. Reports having an episode of "shaking" this morning because she did not eat breakfast - meter showing CBG 104. Weight up by 3 lbs since last visit 3 months ago.   Plan: Medications: Increase Novolog 70/30 mix to 80u BID. Continue Metformin and Exenatide as above.  Home glucose monitoring: Frequency:  3 times a day Timing:  before meals  Instruction/counseling given: reminded to bring blood glucose meter & log to each visit, reminded to bring medications to each visit, discussed foot care, discussed the need for weight loss and discussed diet Educational resources provided:   Educated patient about healthy eating and exercise. Emphasized the importance of weight loss. Advised patient to not skip any meals.   Other plans:  -Foot exam -Repeat A1c in 3 months  -Eye exam recently done per patient, get records from [facility].

## 2016-01-20 NOTE — Assessment & Plan Note (Signed)
HPI Patient reports having mild numbness on the lateral and posterior aspect of her right lower extremity which she noticed for the first time after waking up this morning. States the numbness is better now. Reports having leg cramps in the past but is not experiencing them anymore. Denies having any weakness in her lower extremities. She does have a history of chronic lower back pain secondary to lumbar disk herniation. However, denies having any lower extremity weakness, saddle anesthesia, or urinary/ fecal incontinence.   A Her lower extremity weakness could possibly be due to compression of the lateral sural cutaneous nerve from sleeping in one position in bed. No lower extremity weakness or foot drop noticed on exam. Sensation grossly intact.   P -CTM  -Asked patient to seek immediate medical attention if she experiences any "alarm symptoms" (as mentioned above)

## 2016-01-20 NOTE — Assessment & Plan Note (Signed)
A Patient continues to experience GERD symptoms. Epigastrium mildly TTP on exam today.  P -Protonix 40 mg daily

## 2016-01-21 ENCOUNTER — Other Ambulatory Visit: Payer: Self-pay | Admitting: Internal Medicine

## 2016-01-21 DIAGNOSIS — N6320 Unspecified lump in the left breast, unspecified quadrant: Secondary | ICD-10-CM

## 2016-01-21 NOTE — Progress Notes (Signed)
Medicine attending: Medical history, presenting problems, physical findings, and medications, reviewed with resident physician Dr Twanna Hy on the day of the patient visit and I concur with her evaluation and management plan.

## 2016-01-22 ENCOUNTER — Ambulatory Visit
Admission: RE | Admit: 2016-01-22 | Discharge: 2016-01-22 | Disposition: A | Discharge status: Home / Self Care | Payer: PPO | Source: Ambulatory Visit | Attending: Internal Medicine | Admitting: Internal Medicine

## 2016-01-22 DIAGNOSIS — N6320 Unspecified lump in the left breast, unspecified quadrant: Secondary | ICD-10-CM

## 2016-01-22 DIAGNOSIS — N6489 Other specified disorders of breast: Secondary | ICD-10-CM | POA: Diagnosis not present

## 2016-01-22 DIAGNOSIS — N63 Unspecified lump in breast: Secondary | ICD-10-CM | POA: Diagnosis not present

## 2016-01-26 ENCOUNTER — Ambulatory Visit: Payer: PPO

## 2016-01-27 ENCOUNTER — Other Ambulatory Visit: Payer: Self-pay | Admitting: Surgery

## 2016-01-27 DIAGNOSIS — N6452 Nipple discharge: Secondary | ICD-10-CM | POA: Diagnosis not present

## 2016-01-27 DIAGNOSIS — Z9889 Other specified postprocedural states: Secondary | ICD-10-CM | POA: Diagnosis not present

## 2016-01-27 DIAGNOSIS — N6320 Unspecified lump in the left breast, unspecified quadrant: Secondary | ICD-10-CM

## 2016-02-02 ENCOUNTER — Other Ambulatory Visit: Payer: Self-pay | Admitting: Surgery

## 2016-02-02 DIAGNOSIS — N6320 Unspecified lump in the left breast, unspecified quadrant: Secondary | ICD-10-CM

## 2016-02-05 ENCOUNTER — Telehealth: Payer: Self-pay

## 2016-02-05 NOTE — Telephone Encounter (Signed)
Returning Dr Rathore's call - states very important. Sent message to MD via text .

## 2016-02-05 NOTE — Telephone Encounter (Signed)
Needs to speak with a nurse regarding a call she received from Dr. Marlowe Sax.

## 2016-02-05 NOTE — Telephone Encounter (Signed)
Talked to Dr Marlowe Sax - no message sent to [facility] as discussed. So i did not inform pt of any results.

## 2016-02-05 NOTE — Telephone Encounter (Signed)
Spoke to patient over the phone. Note in Epic. We need to get her records from Dr. Pollie Friar office [facility] surgery). Thanks.

## 2016-02-08 NOTE — Telephone Encounter (Signed)
Message sent to [facility], her nurse.

## 2016-02-10 ENCOUNTER — Encounter (HOSPITAL_COMMUNITY)
Admission: RE | Admit: 2016-02-10 | Discharge: 2016-02-10 | Disposition: A | Discharge status: Home / Self Care | Payer: PPO | Source: Ambulatory Visit | Attending: Surgery | Admitting: Surgery

## 2016-02-10 ENCOUNTER — Encounter (HOSPITAL_COMMUNITY): Payer: Self-pay

## 2016-02-10 DIAGNOSIS — E119 Type 2 diabetes mellitus without complications: Secondary | ICD-10-CM | POA: Diagnosis not present

## 2016-02-10 DIAGNOSIS — I10 Essential (primary) hypertension: Secondary | ICD-10-CM | POA: Diagnosis not present

## 2016-02-10 DIAGNOSIS — Z01812 Encounter for preprocedural laboratory examination: Secondary | ICD-10-CM | POA: Insufficient documentation

## 2016-02-10 HISTORY — DX: Unspecified asthma, uncomplicated: J45.909

## 2016-02-10 HISTORY — DX: Gastro-esophageal reflux disease without esophagitis: K21.9

## 2016-02-10 LAB — COMPREHENSIVE METABOLIC PANEL
ALBUMIN: 3.8 g/dL (ref 3.5–5.0)
ALK PHOS: 67 U/L (ref 38–126)
ALT: 42 U/L (ref 14–54)
ANION GAP: 9 (ref 5–15)
AST: 46 U/L — ABNORMAL HIGH (ref 15–41)
BILIRUBIN TOTAL: 0.3 mg/dL (ref 0.3–1.2)
BUN: 7 mg/dL (ref 6–20)
CALCIUM: 9.9 mg/dL (ref 8.9–10.3)
CO2: 30 mmol/L (ref 22–32)
Chloride: 102 mmol/L (ref 101–111)
Creatinine, Ser: 0.65 mg/dL (ref 0.44–1.00)
GFR calc non Af Amer: >60 mL/min (ref >60)
Glucose, Bld: 171 mg/dL — ABNORMAL HIGH (ref 65–99)
POTASSIUM: 3.5 mmol/L (ref 3.5–5.1)
Sodium: 141 mmol/L (ref 135–145)
TOTAL PROTEIN: 7.4 g/dL (ref 6.5–8.1)

## 2016-02-10 LAB — CBC
HCT: 43 % (ref 36.0–46.0)
Hemoglobin: 13.2 g/dL (ref 12.0–15.0)
MCH: 24.5 pg — AB (ref 26.0–34.0)
MCHC: 30.7 g/dL (ref 30.0–36.0)
MCV: 79.9 fL (ref 78.0–100.0)
PLATELETS: 279 10*3/uL (ref 150–400)
RBC: 5.38 MIL/uL — AB (ref 3.87–5.11)
RDW: 15.7 % — ABNORMAL HIGH (ref 11.5–15.5)
WBC: 6.7 10*3/uL (ref 4.0–10.5)

## 2016-02-10 LAB — GLUCOSE, CAPILLARY: GLUCOSE-CAPILLARY: 176 mg/dL — AB (ref 65–99)

## 2016-02-10 NOTE — Pre-Procedure Instructions (Signed)
[name]  02/10/2016      [facility] [name] ([place]), [name] ([name]) [place] [address] Phone: [phone] Fax: [phone], [name] - [place], [place] - [address] [place]  [address] Phone: [phone] Fax: [phone]  [place], [place] - [address] [name] [address] Phone: [phone] Fax: [phone]    Your procedure is scheduled on   Thursday  02/18/16  Report to [place] at 800 A.M.  Call this number if you have problems the morning of surgery:  [phone]   Remember:  Do not eat food or drink liquids after midnight.  Take these medicines the morning of surgery with A SIP OF WATER    ALBUTEROL, SYMBICORT, OXYCODONE IF NEEDED, PANTOPRAZOLE (PROTONIX)          (STOP 1 WEEK PRIOR TO SURGERY- ASPIRIN, MULTIVITAMIN, IBUPROFEN/ ADVIL/ MOTRIN, GOODY POWDERS, BC'S, HERBAL MEDICINES)    How to Manage Your Diabetes Before and After Surgery  Why is it important to control my blood sugar before and after surgery? . Improving blood sugar levels before and after surgery helps healing and can limit problems. . A way of improving blood sugar control is eating a healthy diet by: o  Eating less sugar and carbohydrates o  Increasing activity/exercise o  Talking with your doctor about reaching your blood sugar goals . High blood sugars (greater than 180 mg/dL) can raise your risk of infections and slow your recovery, so you will need to focus on controlling your diabetes during the weeks before surgery. . Make sure that the doctor who takes care of your diabetes knows about your planned surgery including the date and location.  How do I manage my blood sugar before surgery? . Check your blood sugar at least 4 times a day, starting 2 days before surgery, to make sure that the level is  not too high or low. o Check your blood sugar the morning of your surgery when you wake up and every 2 hours until you get to the Short Stay unit. . If your blood sugar is less than 70 mg/dL, you will need to treat for low blood sugar: o Do not take insulin. o Treat a low blood sugar (less than 70 mg/dL) with  cup of clear juice (cranberry or apple), 4 glucose tablets, OR glucose gel. o Recheck blood sugar in 15 minutes after treatment (to make sure it is greater than 70 mg/dL). If your blood sugar is not greater than 70 mg/dL on recheck, call [phone] for further instructions. . Report your blood sugar to the short stay nurse when you get to Short Stay.  . If you are admitted to the hospital after surgery: o Your blood sugar will be checked by the staff and you will probably be given insulin after surgery (instead of oral diabetes medicines) to make sure you have good blood sugar levels. o The goal for blood sugar control after surgery is 80-180 mg/dL.              WHAT DO I DO ABOUT MY DIABETES MEDICATION?   Marland Kitchen Do not take oral diabetes medicines (pills) the morning of surgery.  . THE NIGHT BEFORE SURGERY, take ____56_______ units of _____NOVOLOG 70/30______insulin.       Marland Kitchen HE MORNING OF SURGERY, take _____NONE________ units of ___NONE_______insulin.  . The  day of surgery, do not take other diabetes injectables, including Byetta (exenatide), Bydureon (exenatide ER), Victoza (liraglutide), or Trulicity (dulaglutide).  . If your CBG is greater than 220 mg/dL, you may take  of your sliding scale (correction) dose of insulin.  Other Instructions:          Patient Signature:  Date:   Nurse Signature:  Date:   Reviewed and Endorsed by [facility] Patient Education Committee, August 2015  Do not wear jewelry, make-up or nail polish.  Do not wear lotions, powders, or perfumes, or deoderant.  Do not shave 48 hours prior to surgery.  Men may shave face and neck.  Do  not bring valuables to the hospital.  [facility] is not responsible for any belongings or valuables.  Contacts, dentures or bridgework may not be worn into surgery.  Leave your suitcase in the car.  After surgery it may be brought to your room.  For patients admitted to the hospital, discharge time will be determined by your treatment team.  Patients discharged the day of surgery will not be allowed to [name] home.   Name and phone number of your driver:    Special instructions:  SEE PREPARING FOR SURGERY  Please read over the following fact sheets that you were given. Surgical Site Infection Prevention

## 2016-02-12 HISTORY — PX: BREAST EXCISIONAL BIOPSY: SUR124

## 2016-02-16 ENCOUNTER — Ambulatory Visit
Admission: RE | Admit: 2016-02-16 | Discharge: 2016-02-16 | Disposition: A | Discharge status: Home / Self Care | Payer: PPO | Source: Ambulatory Visit | Attending: Surgery | Admitting: Surgery

## 2016-02-16 ENCOUNTER — Other Ambulatory Visit: Payer: Self-pay | Admitting: Surgery

## 2016-02-16 DIAGNOSIS — N6320 Unspecified lump in the left breast, unspecified quadrant: Secondary | ICD-10-CM

## 2016-02-16 DIAGNOSIS — D242 Benign neoplasm of left breast: Secondary | ICD-10-CM | POA: Diagnosis not present

## 2016-02-17 MED ORDER — ACETAMINOPHEN 500 MG PO TABS
1000.0000 mg | ORAL_TABLET | ORAL | Status: AC
  Administered 2016-02-18: 1000 mg via ORAL
  Filled 2016-02-17: qty 2

## 2016-02-17 MED ORDER — GABAPENTIN 300 MG PO CAPS
300.0000 mg | ORAL_CAPSULE | ORAL | Status: AC
  Administered 2016-02-18: 300 mg via ORAL
  Filled 2016-02-17: qty 1

## 2016-02-17 MED ORDER — CEFAZOLIN SODIUM 10 G IJ SOLR
3.0000 g | INTRAMUSCULAR | Status: AC
  Administered 2016-02-18: 3 g via INTRAVENOUS
  Filled 2016-02-17: qty 3000

## 2016-02-17 NOTE — H&P (Addendum)
[name] Location: [facility] Patient #: [id number] DOB: 1960/11/16 Married / Language: English / Race: Black or African American Female  History of Present Illness   The patient is a 55 year old female who presents with a complaint of nipple discharge.   The PCP is Dr. Ninetta Lights  The patient was referred by Dr. Theodosia Blender.  Sister, who is a patient of mine, is with her.  She had a prior bilateral breast reduction in 1980. She has done well since that time with no further breast biopsies. About 1 month ago she noticed a bloody left nipple discharge. After couple weeks this became clear fluid. She was seen at the breast center where a mammogram revealed an 8 mm mass in the subareolar aspect of the left breast. She had a mammogram/biopsy of her left breast - 01/22/2016 - [id number]) which showed focal portion of large duct. She has had pain in her left breast since the biopsy.  Plan: Exicision of papilloma area. (By my review of the mammogram, the clip appears to be in the skin. I spoke to Dr. Ammie Ferrier. She thought the best way to localize this was with a seed)  Past Medical History: 1. HTN x 5 years 2. DM x 5 years On insulin 3. Diabetic neuropathy 4. GERD 5. Back surgery 2012 - [place] 6. COlonoscopy - ? physician 2010 7. OSA x 5 years On CPAP 8. Morbid obesity Weight 242, BMI - 44.9   Social History: Married. Works as Patent examiner for [employer]. Sister (who is a patient of mine) with her.   Other Problems Lars Mage Spillers, CMA; 01/27/2016 10:48 AM) Asthma Back Pain Chest pain Diabetes Mellitus Gastroesophageal Reflux Disease High blood pressure Hypercholesterolemia Lump In Breast Sleep Apnea  Past Surgical History Lars Mage Spillers, CMA; 01/27/2016 10:48 AM) Breast Biopsy Left. Colon Polyp Removal - Colonoscopy Hysterectomy (not due to cancer) -  Partial Mammoplasty; Reduction Bilateral. Oral Surgery Spinal Surgery - Lower Back Tonsillectomy  Diagnostic Studies History Illene Regulus, CMA; 01/27/2016 10:48 AM) Colonoscopy 5-10 years ago Mammogram within last year Pap Smear >5 years ago  Allergies Illene Regulus, CMA; 01/27/2016 10:46 AM) Losartan Potassium *ANTIHYPERTENSIVES*  Medication History (Alisha Spillers, CMA; 01/27/2016 11:01 AM) Albuterol Sulfate (108 (90 Base)MCG/ACT Aero Pow Br Act, Inhalation) Active. Baby Aspirin (81MG  Tablet Chewable, Oral) Active. Symbicort (80-4.5MCG/ACT Aerosol, Inhalation) Active. Calcium Carbonate (600MG  Tablet, Oral) Active. Chlorpheniramine Maleate (4MG  Tablet, Oral) Active. Byetta 10 MCG Pen (10MCG/0.04ML Solution, Subcutaneous) Active. Gabapentin (300MG  Capsule, Oral) Active. HydroCHLOROthiazide (25MG  Tablet, Oral) Active. Insulin Aspart (100UNIT/ML Solution, Subcutaneous) Active. MetFORMIN HCl (1000MG  Tablet, Oral) Active. Nasonex (50MCG/ACT Suspension, Nasal) Active. Multivitamin Adults (Oral) Active. Oxycodone-Acetaminophen (5-325MG  Tablet, Oral) Active. Pantoprazole Sodium (40MG  Tablet DR, Oral) Active. Crestor (20MG  Tablet, Oral) Active. Januvia (100MG  Tablet, Oral) Active. Medications Reconciled  Social History Illene Regulus, CMA; 01/27/2016 10:48 AM) Alcohol use Remotely quit alcohol use. Caffeine use Carbonated beverages, Coffee, Tea. Illicit drug use Remotely quit drug use. Tobacco use Former smoker.  Family History Illene Regulus, [place]; 01/27/2016 10:48 AM) Arthritis Family Members In General, Father, Mother, Sister. Breast Cancer Sister. Cerebrovascular Accident Sister. Colon Polyps Sister. Depression Mother. Diabetes Mellitus Father, Mother, Sister. Heart Disease Father. Hypertension Father, Mother, Sister. Kidney Disease Sister. Ovarian Cancer Family Members In General. Respiratory Condition Family Members In  General. Thyroid problems Sister.  Pregnancy / Birth History Illene Regulus, CMA; 01/27/2016 10:48 AM) Age at menarche 46 years. Gravida 5 Maternal age 58-20 Para 3    Review of  Systems Lars Mage Spillers CMA; 01/27/2016 10:48 AM) General Present- Night Sweats. Not Present- Appetite Loss, Chills, Fatigue, Fever, Weight Gain and Weight Loss. Skin Not Present- Change in Wart/Mole, Dryness, Hives, Jaundice, New Lesions, Non-Healing Wounds, Rash and Ulcer. HEENT Present- Seasonal Allergies and Wears glasses/contact lenses. Not Present- Earache, Hearing Loss, Hoarseness, Nose Bleed, Oral Ulcers, Ringing in the Ears, Sinus Pain, Sore Throat, Visual Disturbances and Yellow Eyes. Breast Present- Breast Pain and Nipple Discharge. Not Present- Breast Mass and Skin Changes. Cardiovascular Present- Leg Cramps. Not Present- Chest Pain, Difficulty Breathing Lying Down, Palpitations, Rapid Heart Rate, Shortness of Breath and Swelling of Extremities. Gastrointestinal Not Present- Abdominal Pain, Bloating, Bloody Stool, Change in Bowel Habits, Chronic diarrhea, Constipation, Difficulty Swallowing, Excessive gas, Gets full quickly at meals, Hemorrhoids, Indigestion, Nausea, Rectal Pain and Vomiting. Female Genitourinary Not Present- Frequency, Nocturia, Painful Urination, Pelvic Pain and Urgency. Musculoskeletal Present- Back Pain, Joint Pain, Muscle Pain and Muscle Weakness. Not Present- Joint Stiffness and Swelling of Extremities. Neurological Present- Numbness, Tingling, Trouble walking and Weakness. Not Present- Decreased Memory, Fainting, Headaches, Seizures and Tremor. Endocrine Present- Hot flashes. Not Present- Cold Intolerance, Excessive Hunger, Hair Changes, Heat Intolerance and New Diabetes. Hematology Not Present- Blood Thinners, Easy Bruising, Excessive bleeding, Gland problems, HIV and Persistent Infections.  Vitals (Alisha Spillers CMA; 01/27/2016 10:46 AM) 01/27/2016 10:45 AM Weight: 262  lb Height: 64in Body Surface Area: 2.19 m Body Mass Index: 44.97 kg/m  Pulse: 88 (Regular)  BP: 162/90 (Sitting, Left Arm, Standard)   Physical Exam  General: Obese AA F alert and generally healthy appearing. Skin: Inspection and palpation of the skin unremarkable.  Eyes: Conjunctivae white, pupils equal. Face, ears, nose, mouth, and throat: Face - normal. Normal ears and nose. Lips and teeth normal.  Neck: Supple. No mass. Trachea midline. No thyroid mass. Lymph Nodes: No supraclavicular or cervical adenopathy. No axillary adenopathy. No inguinal adenopathy.  Lungs: Normal respiratory effort. Clear to auscultation and symmetric breath sounds. Cardiovascular: Regular rate and rythm. Normal auscultation of the heart. No murmur or rub. Normal carotid pulse.  Breasts: Right - reduction mammoplasty scar Left - reduction mammoplasty scar - tender to palpation. I could not elicit a nipple discharge, but she was tender.  Abdomen: Soft. No mass. Liver and spleen not palpable. No tenderness. No hernia. Normal bowel sounds. No abdominal scars. Rectal: Not done.  Musculoskeletal/extremities: Normal gait. Good strength and ROM in upper and lower extremities. Digits and nails are unremarkable.  Neurologic: Grossly intact to motor and sensory function. No obvious deficit in the cranial nerves. Psychiatric: Has normal mood and affect. Judgement and insight appear normal.  Assessment & Plan  1.  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 (discussed with Dr. Theda Sers - plan seed loc)   Schedule for Surgery  2.  HISTORY OF BILATERAL BREAST REDUCTION SURGERY (Z98.890)  3. HTN x 5 years 4. DM x 5 years On insulin 5. Diabetic neuropathy 6. GERD 7. Back surgery 2012 - [place] 8. COlonoscopy - ? physician 2010 9. OSA x 5 years On  CPAP 10. Morbid obesity  Weight 242, BMI - 44.9   Alphonsa Overall, MD, [facility] Surgery Pager: [phone] Office phone:  [phone]

## 2016-02-18 ENCOUNTER — Ambulatory Visit (HOSPITAL_COMMUNITY): Payer: PPO | Admitting: Certified Registered Nurse Anesthetist

## 2016-02-18 ENCOUNTER — Encounter (HOSPITAL_COMMUNITY): Payer: Self-pay | Admitting: Certified Registered Nurse Anesthetist

## 2016-02-18 ENCOUNTER — Ambulatory Visit
Admission: RE | Admit: 2016-02-18 | Discharge: 2016-02-18 | Disposition: A | Discharge status: Home / Self Care | Payer: PPO | Source: Ambulatory Visit | Attending: Surgery | Admitting: Surgery

## 2016-02-18 ENCOUNTER — Encounter (HOSPITAL_COMMUNITY)
Admission: RE | Disposition: A | Discharge status: Home / Self Care | Payer: Self-pay | Source: Ambulatory Visit | Attending: Surgery

## 2016-02-18 ENCOUNTER — Ambulatory Visit (HOSPITAL_COMMUNITY)
Admission: RE | Admit: 2016-02-18 | Discharge: 2016-02-18 | Disposition: A | Discharge status: Home / Self Care | Payer: PPO | Source: Ambulatory Visit | Attending: Surgery | Admitting: Surgery

## 2016-02-18 DIAGNOSIS — E119 Type 2 diabetes mellitus without complications: Secondary | ICD-10-CM | POA: Insufficient documentation

## 2016-02-18 DIAGNOSIS — D242 Benign neoplasm of left breast: Secondary | ICD-10-CM | POA: Diagnosis not present

## 2016-02-18 DIAGNOSIS — N6082 Other benign mammary dysplasias of left breast: Secondary | ICD-10-CM | POA: Diagnosis not present

## 2016-02-18 DIAGNOSIS — I10 Essential (primary) hypertension: Secondary | ICD-10-CM | POA: Diagnosis not present

## 2016-02-18 DIAGNOSIS — Z6841 Body Mass Index (BMI) 40.0 and over, adult: Secondary | ICD-10-CM | POA: Diagnosis not present

## 2016-02-18 DIAGNOSIS — N6320 Unspecified lump in the left breast, unspecified quadrant: Secondary | ICD-10-CM

## 2016-02-18 DIAGNOSIS — N6012 Diffuse cystic mastopathy of left breast: Secondary | ICD-10-CM | POA: Diagnosis not present

## 2016-02-18 DIAGNOSIS — Z87891 Personal history of nicotine dependence: Secondary | ICD-10-CM | POA: Insufficient documentation

## 2016-02-18 DIAGNOSIS — N63 Unspecified lump in breast: Secondary | ICD-10-CM | POA: Diagnosis not present

## 2016-02-18 HISTORY — DX: Benign neoplasm of left breast: D24.2

## 2016-02-18 HISTORY — PX: RADIOACTIVE SEED GUIDED EXCISIONAL BREAST BIOPSY: SHX6490

## 2016-02-18 LAB — GLUCOSE, CAPILLARY
GLUCOSE-CAPILLARY: 200 mg/dL — AB (ref 65–99)
Glucose-Capillary: 174 mg/dL — ABNORMAL HIGH (ref 65–99)

## 2016-02-18 SURGERY — RADIOACTIVE SEED GUIDED BREAST BIOPSY
Anesthesia: General | Site: Breast | Laterality: Left

## 2016-02-18 MED ORDER — FENTANYL CITRATE (PF) 100 MCG/2ML IJ SOLN
INTRAMUSCULAR | Status: DC | PRN
  Administered 2016-02-18 (×2): 50 ug via INTRAVENOUS

## 2016-02-18 MED ORDER — LIDOCAINE HCL (CARDIAC) 20 MG/ML IV SOLN
INTRAVENOUS | Status: DC | PRN
  Administered 2016-02-18: 60 mg via INTRAVENOUS

## 2016-02-18 MED ORDER — PROPOFOL 10 MG/ML IV BOLUS
INTRAVENOUS | Status: DC | PRN
  Administered 2016-02-18: 100 mg via INTRAVENOUS
  Administered 2016-02-18: 180 mg via INTRAVENOUS

## 2016-02-18 MED ORDER — BUPIVACAINE HCL (PF) 0.25 % IJ SOLN
INTRAMUSCULAR | Status: AC
  Filled 2016-02-18: qty 30

## 2016-02-18 MED ORDER — MIDAZOLAM HCL 2 MG/2ML IJ SOLN
INTRAMUSCULAR | Status: AC
  Filled 2016-02-18: qty 2

## 2016-02-18 MED ORDER — ONDANSETRON HCL 4 MG/2ML IJ SOLN
INTRAMUSCULAR | Status: DC | PRN
  Administered 2016-02-18: 4 mg via INTRAVENOUS

## 2016-02-18 MED ORDER — 0.9 % SODIUM CHLORIDE (POUR BTL) OPTIME
TOPICAL | Status: DC | PRN
  Administered 2016-02-18: 1000 mL

## 2016-02-18 MED ORDER — PROMETHAZINE HCL 25 MG/ML IJ SOLN: 6.2500 mg | INTRAMUSCULAR | Status: DC | PRN

## 2016-02-18 MED ORDER — MIDAZOLAM HCL 5 MG/5ML IJ SOLN
INTRAMUSCULAR | Status: DC | PRN
  Administered 2016-02-18: 1 mg via INTRAVENOUS

## 2016-02-18 MED ORDER — SUGAMMADEX SODIUM 500 MG/5ML IV SOLN
INTRAVENOUS | Status: DC | PRN
  Administered 2016-02-18: 240.4 mg via INTRAVENOUS

## 2016-02-18 MED ORDER — OXYCODONE HCL 5 MG PO TABS
ORAL_TABLET | ORAL | Status: AC
  Filled 2016-02-18: qty 1

## 2016-02-18 MED ORDER — BUPIVACAINE HCL (PF) 0.25 % IJ SOLN
INTRAMUSCULAR | Status: DC | PRN
  Administered 2016-02-18: 30 mL

## 2016-02-18 MED ORDER — OXYCODONE HCL 5 MG/5ML PO SOLN: 5.0000 mg | Freq: Once | ORAL | Status: AC | PRN

## 2016-02-18 MED ORDER — KETOROLAC TROMETHAMINE 30 MG/ML IJ SOLN
30.0000 mg | Freq: Once | INTRAMUSCULAR | Status: AC | PRN
  Administered 2016-02-18: 30 mg via INTRAVENOUS

## 2016-02-18 MED ORDER — KETOROLAC TROMETHAMINE 30 MG/ML IJ SOLN
INTRAMUSCULAR | Status: AC
  Filled 2016-02-18: qty 1

## 2016-02-18 MED ORDER — PROPOFOL 10 MG/ML IV BOLUS
INTRAVENOUS | Status: AC
  Filled 2016-02-18: qty 20

## 2016-02-18 MED ORDER — FENTANYL CITRATE (PF) 100 MCG/2ML IJ SOLN
INTRAMUSCULAR | Status: AC
  Filled 2016-02-18: qty 4

## 2016-02-18 MED ORDER — HYDROCODONE-ACETAMINOPHEN 5-325 MG PO TABS: 1.0000 | ORAL_TABLET | Freq: Four times a day (QID) | ORAL | 0 refills | Status: DC | PRN

## 2016-02-18 MED ORDER — OXYCODONE HCL 5 MG PO TABS
5.0000 mg | ORAL_TABLET | Freq: Once | ORAL | Status: AC | PRN
  Administered 2016-02-18: 5 mg via ORAL

## 2016-02-18 MED ORDER — PHENYLEPHRINE 40 MCG/ML (10ML) SYRINGE FOR IV PUSH (FOR BLOOD PRESSURE SUPPORT)
PREFILLED_SYRINGE | INTRAVENOUS | Status: DC | PRN
  Administered 2016-02-18 (×2): 80 ug via INTRAVENOUS
  Administered 2016-02-18: 40 ug via INTRAVENOUS

## 2016-02-18 MED ORDER — ROCURONIUM BROMIDE 100 MG/10ML IV SOLN
INTRAVENOUS | Status: DC | PRN
  Administered 2016-02-18: 60 mg via INTRAVENOUS

## 2016-02-18 MED ORDER — DEXAMETHASONE SODIUM PHOSPHATE 10 MG/ML IJ SOLN
INTRAMUSCULAR | Status: DC | PRN
  Administered 2016-02-18: 5 mg via INTRAVENOUS

## 2016-02-18 MED ORDER — LACTATED RINGERS IV SOLN
INTRAVENOUS | Status: DC
  Administered 2016-02-18: 09:00:00 via INTRAVENOUS
  Administered 2016-02-18: 50 mL/h via INTRAVENOUS

## 2016-02-18 MED ORDER — HYDROMORPHONE HCL 1 MG/ML IJ SOLN: 0.2500 mg | INTRAMUSCULAR | Status: DC | PRN

## 2016-02-18 SURGICAL SUPPLY — 45 items
APPLIER CLIP 9.375 MED OPEN (MISCELLANEOUS)
APR CLP MED 9.3 20 MLT OPN (MISCELLANEOUS)
BINDER BREAST LRG (GAUZE/BANDAGES/DRESSINGS) IMPLANT
BINDER BREAST XLRG (GAUZE/BANDAGES/DRESSINGS) ×2 IMPLANT
BLADE SURG 15 STRL LF DISP TIS (BLADE) ×1 IMPLANT
BLADE SURG 15 STRL SS (BLADE) ×3
CANISTER SUCTION 2500CC (MISCELLANEOUS) ×3 IMPLANT
CHLORAPREP W/TINT 26ML (MISCELLANEOUS) ×3 IMPLANT
CLIP APPLIE 9.375 MED OPEN (MISCELLANEOUS) IMPLANT
CLIP TI WIDE RED SMALL 6 (CLIP) ×3 IMPLANT
COVER PROBE W GEL 5X96 (DRAPES) ×3 IMPLANT
COVER SURGICAL LIGHT HANDLE (MISCELLANEOUS) ×3 IMPLANT
DEVICE DUBIN SPECIMEN MAMMOGRA (MISCELLANEOUS) ×3 IMPLANT
DRAPE CHEST BREAST 15X10 FENES (DRAPES) ×3 IMPLANT
DRAPE UTILITY XL STRL (DRAPES) ×5 IMPLANT
ELECT COATED BLADE 2.86 ST (ELECTRODE) ×3 IMPLANT
ELECT REM PT RETURN 9FT ADLT (ELECTROSURGICAL) ×3
ELECTRODE REM PT RTRN 9FT ADLT (ELECTROSURGICAL) ×1 IMPLANT
GAUZE SPONGE 4X4 12PLY STRL (GAUZE/BANDAGES/DRESSINGS) ×3 IMPLANT
GLOVE BIOGEL PI IND STRL 6.5 (GLOVE) IMPLANT
GLOVE BIOGEL PI INDICATOR 6.5 (GLOVE) ×4
GLOVE ECLIPSE 6.0 STRL STRAW (GLOVE) ×2 IMPLANT
GLOVE SURG SIGNA 7.5 PF LTX (GLOVE) ×5 IMPLANT
GOWN STRL REUS W/ TWL LRG LVL3 (GOWN DISPOSABLE) ×1 IMPLANT
GOWN STRL REUS W/ TWL XL LVL3 (GOWN DISPOSABLE) ×1 IMPLANT
GOWN STRL REUS W/TWL LRG LVL3 (GOWN DISPOSABLE) ×3
GOWN STRL REUS W/TWL XL LVL3 (GOWN DISPOSABLE) ×3
KIT BASIN OR (CUSTOM PROCEDURE TRAY) ×3 IMPLANT
KIT MARKER MARGIN INK (KITS) ×3 IMPLANT
LIQUID BAND (GAUZE/BANDAGES/DRESSINGS) ×3 IMPLANT
NDL HYPO 25X1 1.5 SAFETY (NEEDLE) ×1 IMPLANT
NEEDLE HYPO 25X1 1.5 SAFETY (NEEDLE) ×3 IMPLANT
NS IRRIG 1000ML POUR BTL (IV SOLUTION) ×2 IMPLANT
PACK SURGICAL SETUP 50X90 (CUSTOM PROCEDURE TRAY) ×3 IMPLANT
PENCIL BUTTON HOLSTER BLD 10FT (ELECTRODE) ×3 IMPLANT
SPONGE LAP 18X18 X RAY DECT (DISPOSABLE) ×3 IMPLANT
SUT MNCRL AB 4-0 PS2 18 (SUTURE) ×3 IMPLANT
SUT VIC AB 3-0 SH 8-18 (SUTURE) ×3 IMPLANT
SYR BULB 3OZ (MISCELLANEOUS) ×3 IMPLANT
SYR CONTROL 10ML LL (SYRINGE) ×3 IMPLANT
TOWEL OR 17X24 6PK STRL BLUE (TOWEL DISPOSABLE) ×3 IMPLANT
TOWEL OR 17X26 10 PK STRL BLUE (TOWEL DISPOSABLE) IMPLANT
TUBE CONNECTING 12'X1/4 (SUCTIONS)
TUBE CONNECTING 12X1/4 (SUCTIONS) IMPLANT
YANKAUER SUCT BULB TIP NO VENT (SUCTIONS) ×2 IMPLANT

## 2016-02-18 NOTE — Transfer of Care (Signed)
Immediate Anesthesia Transfer of Care Note  Patient: [name]  Procedure(s) Performed: Procedure(s): LEFT RADIOACTIVE SEED GUIDED EXCISIONAL BREAST BIOPSY (Left)  Patient Location: PACU  Anesthesia Type:General  Level of Consciousness: awake, alert , oriented and patient cooperative  Airway & Oxygen Therapy: Patient Spontanous Breathing and Patient connected to nasal cannula oxygen  Post-op Assessment: Report given to RN and Post -op Vital signs reviewed and stable  Post vital signs: Reviewed and stable  Last Vitals:  Vitals:   02/18/16 0801 02/18/16 1045  BP: (!) 176/81 113/73  Pulse: (!) 108 84  Resp: 20 11  Temp: 36.8 C 36.6 C    Last Pain:  Vitals:   02/18/16 1045  TempSrc:   PainSc: (P) 0-No pain      Patients Stated Pain Goal: 3 ([id number])  Complications: No apparent anesthesia complications

## 2016-02-18 NOTE — Op Note (Signed)
02/18/2016  10:42 AM  PATIENT:  [name] DOB: 20-Feb-1961 MRN: [id number]  PREOP DIAGNOSIS:  LEFT BREAST MASS  POSTOP DIAGNOSIS:   LEFT BREAST MASS, subareolar  PROCEDURE:   Procedure(s): LEFT RADIOACTIVE SEED GUIDED EXCISIONAL BREAST BIOPSY  SURGEON:   Ovidio Kin, M.D.  ANESTHESIA:   general  Anesthesiologist: Eilene Ghazi, MD CRNA: Waynard Edwards, CRNA  General  EBL:  minimal  ml  LOCAL MEDICATIONS USED:   30 cc 1/4% marcaine  SPECIMEN:   Left breast mass (note:  In painting the specimen - I switched the superior (now blue) and inferior paint (now red) colors)  COUNTS CORRECT:  YES  INDICATIONS FOR PROCEDURE:  [name] is a 55 y.o. (DOB: 1961/02/22) [facility] female whose primary care physician is Crystal Giovanni, MD and comes for left breast lumpectomy.   She had a left breast mammogram/US on 01/20/2016 which showed an oval hypoechoic mass within the left breast at the 12 o'clock axis, retroareolar, superficial, measuring 0.8 x 0.3 x 0.7 cm.  Biopsy of this area revealed only ductal tissue - so the mass was probably not biopsied.  She now comes for excision of this mass.    The indications and potential complications of surgery were explained to the patient. Potential complications include, but are not limited to, bleeding, infection, the need for further surgery, and nerve injury.     She had a I131 seed placed on 02/15/2070 in her left breast at [facility].  I confirmed the presence of the I131 seed in the pre op area using the Neoprobe.  The seed is in the subareolar position of the left breast.     OPERATIVE NOTE:   The patient was taken to room # 9 at [facility] OR where she underwent a general anesthesia  supervised by Anesthesiologist: Eilene Ghazi, MD CRNA: Waynard Edwards, CRNA. Her left breast and axilla were prepped with  ChloraPrep and sterilely draped.    A time-out and the surgical check list was reviewed.    The breast mass was subareolar - towards the upper inner  quadrant of the left areolar.   I used the Neoprobe to identify the I131 seed.  I tried to excise an area around the tumor of at least 1 cm.    I excised this block of breast tissue approximately 3 cm by 3 cm  in diameter.   I painted the lumpectomy specimen with the 6 color paint kit (note:  In painting the specimen - I switched the superior (now blue) and inferior paint (now red)) and did a specimen mammogram which confirmed the mass, clip, and the seed were all in the right position in the specimen.  The specimen was sent to pathology who called back to confirm that they have the seed and the specimen.   I then irrigated the wound with saline. I infiltrated approximately 30 mL of 1% local between the incisions.   I then closed the wound in layers using 3-0 Vicryl sutures for the deep layer. At the skin, I closed the incisions with a 4-0 Monocryl suture. The incisions were then painted with LiquiBand.  She had gauze place over the wounds and placed in a breast binder.   The patient tolerated the procedure well, was transported to the recovery room in good condition. Sponge and needle count were correct at the end of the case.   Final pathology is pending.   Ovidio Kin, MD, [facility] Pager: [phone] Office phone:  [phone]

## 2016-02-18 NOTE — Anesthesia Procedure Notes (Addendum)
Procedure Name: Intubation Date/Time: 02/18/2016 9:48 AM Performed by: Everlean Cherry A Pre-anesthesia Checklist: Patient identified, Emergency Drugs available, Suction available and Patient being monitored Patient Re-evaluated:Patient Re-evaluated prior to inductionOxygen Delivery Method: Circle system utilized Preoxygenation: Pre-oxygenation with 100% oxygen Intubation Type: IV induction Ventilation: Two handed mask ventilation required, Oral airway inserted - appropriate to patient size and Mask ventilation without difficulty Laryngoscope Size: Mac and 4 Grade View: Grade III Tube type: Oral Tube size: 7.0 mm Number of attempts: 2 Airway Equipment and Method: Stylet and Bougie stylet Placement Confirmation: ETT inserted through vocal cords under direct vision,  positive ETCO2 and breath sounds checked- equal and bilateral Secured at: 22 cm Tube secured with: Tape Dental Injury: Teeth and Oropharynx as per pre-operative assessment  Difficulty Due To: Difficulty was anticipated, Difficult Airway- due to anterior larynx and Difficult Airway- due to large tongue Comments: DL x1 with MAC 4.  Grade 3 view.  Unsuccessful placement of ETT.  VSS.  DL x2 with MAC 4.  Bougie stylet used and ETT placed successfully.  EBBS and VSS.

## 2016-02-18 NOTE — Discharge Instructions (Signed)
[facility] - DISCHARGE INSTRUCTIONS TO PATIENT  Activity:  Driving - May drive in one or two days, if doing well and not taking pain meds.   Lifting - No lifting more than 15 pounds for 5 days, then no limit  Wound Care:   Leave bandage on 2 days, then may remove and shower.  Diet:  As tolerated  Follow up appointment:  Call Dr. Pollie Friar office [facility]) at [phone] for an appointment in 2 to 3 weeks.  Medications and dosages:  Resume your home medications.  You have a prescription for:  Vicodin  Call Dr. Lucia Gaskins or his office  [phone]) if you have:  Temperature greater than 100.4,  Severe uncontrolled pain,  Redness, tenderness, or signs of infection (pain, swelling, redness, odor or green/yellow discharge around the site),  Any other questions or concerns you may have after discharge.  In an emergency, call 911 or go to an Emergency Department at a nearby hospital.

## 2016-02-18 NOTE — Interval H&P Note (Signed)
History and Physical Interval Note:  02/18/2016 9:06 AM  [name]  has presented today for surgery, with the diagnosis of LEFT BREAST MASS  The various methods of treatment have been discussed with the patient and family. After consideration of risks, benefits and other options for treatment, the patient has consented to  Procedure(s): LEFT RADIOACTIVE SEED GUIDED EXCISIONAL BREAST BIOPSY (Left) as a surgical intervention .  Her husband is at the bedside.  The patient's history has been reviewed, patient examined, no change in status, stable for surgery.    I have reviewed the patient's chart and labs.  Questions were answered to the patient's satisfaction.     Lamonte Hartt H

## 2016-02-18 NOTE — Anesthesia Postprocedure Evaluation (Signed)
Anesthesia Post Note  Patient: [name]  Procedure(s) Performed: Procedure(s) (LRB): LEFT RADIOACTIVE SEED GUIDED EXCISIONAL BREAST BIOPSY (Left)  Patient location during evaluation: PACU Anesthesia Type: General Level of consciousness: awake and alert Pain management: pain level controlled Vital Signs Assessment: post-procedure vital signs reviewed and stable Respiratory status: spontaneous breathing, nonlabored ventilation, respiratory function stable and patient connected to nasal cannula oxygen Cardiovascular status: blood pressure returned to baseline and stable Postop Assessment: no signs of nausea or vomiting Anesthetic complications: no    Last Vitals:  Vitals:   02/18/16 1045 02/18/16 1100  BP: 113/73 136/75  Pulse: 84 80  Resp: 11 13  Temp: 36.6 C     Last Pain:  Vitals:   02/18/16 1045  TempSrc:   PainSc: 0-No pain                 Sharlotte Baka S

## 2016-02-18 NOTE — Anesthesia Preprocedure Evaluation (Signed)
Anesthesia Evaluation  Patient identified by MRN, date of birth, ID band Patient awake    Reviewed: Allergy & Precautions, NPO status , Patient's Chart, lab work & pertinent test results  Airway Mallampati: II  TM Distance: >3 FB Neck ROM: Full    Dental no notable dental hx.    Pulmonary neg pulmonary ROS, former smoker,    Pulmonary exam normal breath sounds clear to auscultation       Cardiovascular hypertension, Normal cardiovascular exam Rhythm:Regular Rate:Normal     Neuro/Psych negative neurological ROS  negative psych ROS   GI/Hepatic negative GI ROS, Neg liver ROS,   Endo/Other  diabetesMorbid obesity  Renal/GU negative Renal ROS  negative genitourinary   Musculoskeletal negative musculoskeletal ROS (+)   Abdominal   Peds negative pediatric ROS (+)  Hematology negative hematology ROS (+)   Anesthesia Other Findings   Reproductive/Obstetrics negative OB ROS                             Anesthesia Physical Anesthesia Plan  ASA: III  Anesthesia Plan: General   Post-op Pain Management:    Induction: Intravenous  Airway Management Planned: LMA and Oral ETT  Additional Equipment:   Intra-op Plan:   Post-operative Plan: Extubation in OR  Informed Consent: I have reviewed the patients History and Physical, chart, labs and discussed the procedure including the risks, benefits and alternatives for the proposed anesthesia with the patient or authorized representative who has indicated his/her understanding and acceptance.   Dental advisory given  Plan Discussed with: CRNA and Surgeon  Anesthesia Plan Comments:         Anesthesia Quick Evaluation

## 2016-02-19 ENCOUNTER — Encounter (HOSPITAL_COMMUNITY): Payer: Self-pay | Admitting: Surgery

## 2016-02-24 DIAGNOSIS — E109 Type 1 diabetes mellitus without complications: Secondary | ICD-10-CM | POA: Diagnosis not present

## 2016-02-27 ENCOUNTER — Other Ambulatory Visit: Payer: Self-pay | Admitting: Internal Medicine

## 2016-02-27 DIAGNOSIS — E1140 Type 2 diabetes mellitus with diabetic neuropathy, unspecified: Secondary | ICD-10-CM

## 2016-03-07 DIAGNOSIS — R1031 Right lower quadrant pain: Secondary | ICD-10-CM | POA: Diagnosis not present

## 2016-03-08 DIAGNOSIS — Z6841 Body Mass Index (BMI) 40.0 and over, adult: Secondary | ICD-10-CM | POA: Diagnosis not present

## 2016-03-08 DIAGNOSIS — J45909 Unspecified asthma, uncomplicated: Secondary | ICD-10-CM | POA: Insufficient documentation

## 2016-03-08 DIAGNOSIS — Z124 Encounter for screening for malignant neoplasm of cervix: Secondary | ICD-10-CM | POA: Diagnosis not present

## 2016-03-08 DIAGNOSIS — R1031 Right lower quadrant pain: Secondary | ICD-10-CM | POA: Diagnosis not present

## 2016-03-08 DIAGNOSIS — Z01419 Encounter for gynecological examination (general) (routine) without abnormal findings: Secondary | ICD-10-CM | POA: Diagnosis not present

## 2016-03-08 DIAGNOSIS — B009 Herpesviral infection, unspecified: Secondary | ICD-10-CM | POA: Insufficient documentation

## 2016-03-08 DIAGNOSIS — L989 Disorder of the skin and subcutaneous tissue, unspecified: Secondary | ICD-10-CM | POA: Diagnosis not present

## 2016-03-08 DIAGNOSIS — Z01411 Encounter for gynecological examination (general) (routine) with abnormal findings: Secondary | ICD-10-CM | POA: Diagnosis not present

## 2016-03-08 DIAGNOSIS — R109 Unspecified abdominal pain: Secondary | ICD-10-CM | POA: Diagnosis not present

## 2016-03-15 ENCOUNTER — Other Ambulatory Visit: Payer: Self-pay | Admitting: Internal Medicine

## 2016-03-15 DIAGNOSIS — E1140 Type 2 diabetes mellitus with diabetic neuropathy, unspecified: Secondary | ICD-10-CM

## 2016-03-15 DIAGNOSIS — Z794 Long term (current) use of insulin: Principal | ICD-10-CM

## 2016-03-15 NOTE — Telephone Encounter (Signed)
Requesting pain med and metformin refill

## 2016-03-18 ENCOUNTER — Ambulatory Visit: Payer: PPO | Admitting: Pharmacist

## 2016-03-18 ENCOUNTER — Other Ambulatory Visit: Payer: Self-pay | Admitting: Internal Medicine

## 2016-03-18 DIAGNOSIS — G8929 Other chronic pain: Secondary | ICD-10-CM

## 2016-03-18 DIAGNOSIS — Z794 Long term (current) use of insulin: Principal | ICD-10-CM

## 2016-03-18 DIAGNOSIS — E1140 Type 2 diabetes mellitus with diabetic neuropathy, unspecified: Secondary | ICD-10-CM

## 2016-03-18 DIAGNOSIS — M545 Low back pain: Principal | ICD-10-CM

## 2016-03-18 MED ORDER — LIRAGLUTIDE 18 MG/3ML ~~LOC~~ SOPN: 1.2000 mg | PEN_INJECTOR | Freq: Every day | SUBCUTANEOUS | 3 refills | Status: DC

## 2016-03-18 MED ORDER — OXYCODONE-ACETAMINOPHEN 5-325 MG PO TABS: 1.0000 | ORAL_TABLET | Freq: Three times a day (TID) | ORAL | 0 refills | Status: DC | PRN

## 2016-03-18 NOTE — Telephone Encounter (Signed)
Pt has refills on Metformin.

## 2016-03-18 NOTE — Progress Notes (Signed)
Medication Samples have been provided to the patient.  Drug: Novolog 70/30 FlexPen  Strength: 70/30 (100U) Qty: 1 pen LOT: [id number] Exp.Date: 03/2016 Dosing instructions: Inject 80 units under the skin BID    The patient has been instructed regarding the correct time, dose, and frequency of taking this medication, including desired effects and most common side effects.   Samples signed for by Dr. Silas Flood 9:42 AM 03/18/2016   Medication Samples have been provided to the patient.  Drug: Victoza Pen Strength: 0.6 mg Qty: 1 LOT: [id number]    Exp.Date: 07/13/2017 Dosing instructions: Inject 0.6mg  SubQ once daily for one week; inject 1.2mg  SubQ once daily thereafter  The patient has been instructed regarding the correct time, dose, and frequency of taking this medication, including desired effects and most common side effects.   Samples signed for by Dr. Silas Flood 9:45 AM 03/18/2016

## 2016-03-18 NOTE — Telephone Encounter (Signed)
Last refill 12/28/15. Last OV 01/18/26 ; next appt 04/19/16. UDS 07/21/15.

## 2016-03-21 ENCOUNTER — Other Ambulatory Visit: Payer: Self-pay | Admitting: Internal Medicine

## 2016-03-21 DIAGNOSIS — E1140 Type 2 diabetes mellitus with diabetic neuropathy, unspecified: Secondary | ICD-10-CM

## 2016-03-21 DIAGNOSIS — Z794 Long term (current) use of insulin: Principal | ICD-10-CM

## 2016-03-21 MED ORDER — OXYCODONE-ACETAMINOPHEN 5-325 MG PO TABS: 1.0000 | ORAL_TABLET | Freq: Three times a day (TID) | ORAL | 0 refills | Status: DC | PRN

## 2016-03-21 NOTE — Telephone Encounter (Signed)
Dr Marlowe Sax please call me at [phone]

## 2016-03-21 NOTE — Progress Notes (Signed)
Patient was seen in clinic with [name], PharmD candidate. I agree with the assessment and plan of care documented.  Clarified with patient whether she is taking sitagliptin or exenatide. Stated she stopped sitagliptin. Medication list updated. Patient also stated she is interested in stopping exenatide due to not feeling that it is helping her. Discussed with patient and PCP prospects of switching to liraglutide ($0 copay for patient), both agreed to switch. Patient education and sample provided.

## 2016-03-22 ENCOUNTER — Telehealth: Payer: Self-pay | Admitting: Internal Medicine

## 2016-03-25 NOTE — Telephone Encounter (Signed)
Pt is aware script is ready, have called and lm for rtc

## 2016-03-29 DIAGNOSIS — R1031 Right lower quadrant pain: Secondary | ICD-10-CM | POA: Diagnosis not present

## 2016-03-30 ENCOUNTER — Other Ambulatory Visit: Payer: PPO

## 2016-03-30 DIAGNOSIS — M545 Low back pain: Principal | ICD-10-CM

## 2016-03-30 DIAGNOSIS — L710 Perioral dermatitis: Secondary | ICD-10-CM | POA: Diagnosis not present

## 2016-03-30 DIAGNOSIS — G8929 Other chronic pain: Secondary | ICD-10-CM | POA: Diagnosis not present

## 2016-04-06 LAB — TOXASSURE SELECT,+ANTIDEPR,UR

## 2016-04-19 ENCOUNTER — Encounter: Payer: Self-pay | Admitting: Internal Medicine

## 2016-04-19 ENCOUNTER — Ambulatory Visit (INDEPENDENT_AMBULATORY_CARE_PROVIDER_SITE_OTHER): Payer: PPO | Admitting: Internal Medicine

## 2016-04-19 VITALS — BP 105/55 | HR 105 | Temp 98.0°F | Ht 64.0 in | Wt 255.9 lb

## 2016-04-19 DIAGNOSIS — K219 Gastro-esophageal reflux disease without esophagitis: Secondary | ICD-10-CM

## 2016-04-19 DIAGNOSIS — Z87891 Personal history of nicotine dependence: Secondary | ICD-10-CM

## 2016-04-19 DIAGNOSIS — E1165 Type 2 diabetes mellitus with hyperglycemia: Secondary | ICD-10-CM | POA: Diagnosis not present

## 2016-04-19 DIAGNOSIS — E1140 Type 2 diabetes mellitus with diabetic neuropathy, unspecified: Secondary | ICD-10-CM

## 2016-04-19 DIAGNOSIS — Z23 Encounter for immunization: Secondary | ICD-10-CM | POA: Diagnosis not present

## 2016-04-19 DIAGNOSIS — Z79899 Other long term (current) drug therapy: Secondary | ICD-10-CM

## 2016-04-19 DIAGNOSIS — Z794 Long term (current) use of insulin: Secondary | ICD-10-CM

## 2016-04-19 DIAGNOSIS — Z0000 Encounter for general adult medical examination without abnormal findings: Secondary | ICD-10-CM

## 2016-04-19 DIAGNOSIS — I10 Essential (primary) hypertension: Secondary | ICD-10-CM

## 2016-04-19 LAB — GLUCOSE, CAPILLARY: Glucose-Capillary: 150 mg/dL — ABNORMAL HIGH (ref 65–99)

## 2016-04-19 LAB — POCT GLYCOSYLATED HEMOGLOBIN (HGB A1C): Hemoglobin A1C: 8.5

## 2016-04-19 MED ORDER — PANTOPRAZOLE SODIUM 40 MG PO TBEC: 40.0000 mg | DELAYED_RELEASE_TABLET | Freq: Two times a day (BID) | ORAL | 1 refills | Status: DC

## 2016-04-19 MED ORDER — LIRAGLUTIDE 18 MG/3ML ~~LOC~~ SOPN: 1.8000 mg | PEN_INJECTOR | Freq: Every day | SUBCUTANEOUS | 5 refills | Status: DC

## 2016-04-19 MED ORDER — INSULIN PEN NEEDLE 31G X 5 MM MISC: 5 refills | Status: DC

## 2016-04-19 NOTE — Patient Instructions (Signed)
[name] it was nice seeing you today.  -Dose of Victoza has been increased: inject 1.8 mg daily as instructed  -Take Protonix 40 mg twice daily  -Return for a follow-up visit in 4-6 weeks

## 2016-04-20 ENCOUNTER — Other Ambulatory Visit: Payer: Self-pay | Admitting: *Deleted

## 2016-04-20 LAB — BMP8+ANION GAP
Anion Gap: 19 mmol/L — ABNORMAL HIGH (ref 10.0–18.0)
BUN / CREAT RATIO: 12 (ref 9–23)
BUN: 7 mg/dL (ref 6–24)
CO2: 28 mmol/L (ref 18–29)
CREATININE: 0.59 mg/dL (ref 0.57–1.00)
Calcium: 10.2 mg/dL (ref 8.7–10.2)
Chloride: 96 mmol/L (ref 96–106)
GFR calc Af Amer: 119 mL/min/{1.73_m2} (ref >59)
GFR, EST NON AFRICAN AMERICAN: 104 mL/min/{1.73_m2} (ref >59)
Glucose: 163 mg/dL — ABNORMAL HIGH (ref 65–99)
Potassium: 3.3 mmol/L — ABNORMAL LOW (ref 3.5–5.2)
SODIUM: 143 mmol/L (ref 134–144)

## 2016-04-20 MED ORDER — POTASSIUM CHLORIDE ER 20 MEQ PO TBCR: 20.0000 meq | EXTENDED_RELEASE_TABLET | Freq: Two times a day (BID) | ORAL | 1 refills | Status: DC

## 2016-04-20 NOTE — Progress Notes (Signed)
   CC: Patient is here to discuss hypertension, diabetes, and GERD.  HPI:  [name] is a 55 y.o. female with a past medical history of conditions listed below presenting to the clinic to discuss hypertension, diabetes, and GERD. Please see problem based charting for the status of the patient's current and chronic medical conditions.   Past Medical History:  Diagnosis Date  . Asthma    AS CHILD  . Chronic back pain   . Chronic leg pain    due to back pain  . Cocaine abuse    in remission  . 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 GERD (gastroesophageal reflux disease)   . Hyperlipidemia   . Hypertension   . RECTAL BLEEDING 12/09/2008   Annotation: s/p EGD 7/08 mild gastritis, s/p colonoscopy 7/08- benign polyp  s/p polypectomy and isolated diverticulum.  Qualifier: Diagnosis of  By: Ditzler RN, Debra    . Sleep apnea    CPAP    YEARS AGO DONE 1/2 YEARS AGO AND WAS TOLD DID NOT HAVE  . Tobacco abuse   . Uterine fibroid    s/p hysterectomy    Review of Systems:  Pertinent positives mentioned in HPI. Remainder of all ROS negative.   Physical Exam:  Vitals:   04/19/16 1357  BP: (!) 105/55  Pulse: (!) 105  Temp: 98 F (36.7 C)  TempSrc: Oral  SpO2: 99%  Weight: 255 lb 14.4 oz (116.1 kg)  Height: 5\' 4"  (1.626 m)   Physical Exam  Constitutional: She is oriented to person, place, and time. She appears well-developed and well-nourished. No distress.  HENT:  Mouth/Throat: Oropharynx is clear and moist.  Eyes: EOM are normal.  Neck: Neck supple. No tracheal deviation present.  Cardiovascular: Normal rate, regular rhythm and intact distal pulses.   Pulmonary/Chest: Effort normal and breath sounds normal. No respiratory distress.  Abdominal: Soft. Bowel sounds are normal.  Mild epigastric tenderness. No rebound, guarding, or rigidity.  Musculoskeletal: Normal range of motion. She exhibits no edema.  Neurological: She is alert and oriented to person, place, and time.  Skin:  Skin is warm and dry.    Assessment & Plan:   See Encounters Tab for problem based charting.  Patient discussed with Dr. Evette Doffing

## 2016-04-20 NOTE — Assessment & Plan Note (Signed)
Lab Results  Component Value Date   HGBA1C 8.5 04/19/2016   HGBA1C 8.6 01/19/2016   HGBA1C 8.6 10/20/2015     Assessment: Diabetes control:  poorly controlled (goal A1c less than 7) Comments: Patient is currently on NovoLog 70/30 80 units twice daily, metformin 1000 mg twice daily, Victoza 1.2 mg daily, and Januvia 100 mg daily. Home blood glucose meter showing average value of 176. She is checking her blood glucose 2-3 times a day. Patient has lost approximately 10 pounds in the past 2 months.   Plan: Medications: Increase dose of Victoza to 1.8 mg daily. Continue NovoLog, metformin, and Januvia as above. Home glucose monitoring: Frequency:  2-3 times a day Timing:  before meals Instruction/counseling given: reminded to get eye exam, reminded to bring blood glucose meter & log to each visit, reminded to bring medications to each visit, discussed foot care, discussed the need for weight loss and discussed diet Educational resources provided:   Other plans: -Follow-up visit scheduled for 05/30/2016

## 2016-04-20 NOTE — Assessment & Plan Note (Signed)
Assessment Patient reports having GERD symptoms and epigastric pain despite taking Protonix 40 mg daily. Denies having any nausea, vomiting, or diarrhea. EGD done in November 2010 showing mild antral gastritis. Differentials for her current presentation include peptic ulcer disease, gastritis, and possible malignancy. She did lose approximately 10 pounds in the past 2 months but is taking Victoza for diabetes.  Plan -Increase dose of Protonix to 40 mg twice daily -Follow-up visit scheduled for 05/30/2016 -If patient continues to have symptoms at next visit, consider referral to GI for possible EGD.

## 2016-04-20 NOTE — Assessment & Plan Note (Signed)
BP Readings from Last 3 Encounters:  04/19/16 (!) 105/55  02/18/16 118/77  02/10/16 136/82    Lab Results  Component Value Date   NA 143 04/19/2016   K 3.3 (L) 04/19/2016   CREATININE 0.59 04/19/2016    Assessment: Blood pressure control:  below goal (less than 140/90) Comments: Patient is currently taking hydrochlorothiazide 25 mg daily. Labs at this visit showing serum creatinine 0.59 and low potassium (3.3).  Plan: Medications:  continue current medications. Start potassium 20 mEq twice daily. Educational resources provided:   Educated patient about healthy eating and exercise. Emphasized the importance of weight loss.  Other plans:  -Follow-up visit on 05/30/2016. Repeat BMP at that visit.

## 2016-04-20 NOTE — Assessment & Plan Note (Signed)
Influenza vaccine administered at this visit. 

## 2016-04-21 ENCOUNTER — Telehealth: Payer: Self-pay

## 2016-04-21 NOTE — Progress Notes (Signed)
Internal Medicine Clinic Attending  Case discussed with Dr. Rathoreat the time of the visit. We reviewed the resident's history and exam and pertinent patient test results. I agree with the assessment, diagnosis, and plan of care documented in the resident's note.  

## 2016-05-23 ENCOUNTER — Encounter: Payer: Self-pay | Admitting: Internal Medicine

## 2016-05-31 ENCOUNTER — Encounter (INDEPENDENT_AMBULATORY_CARE_PROVIDER_SITE_OTHER): Payer: Self-pay

## 2016-05-31 ENCOUNTER — Ambulatory Visit (INDEPENDENT_AMBULATORY_CARE_PROVIDER_SITE_OTHER): Payer: PPO | Admitting: Internal Medicine

## 2016-05-31 ENCOUNTER — Encounter: Payer: Self-pay | Admitting: Internal Medicine

## 2016-05-31 VITALS — BP 125/61 | HR 104 | Temp 98.9°F | Ht 64.0 in | Wt 261.9 lb

## 2016-05-31 DIAGNOSIS — Z87891 Personal history of nicotine dependence: Secondary | ICD-10-CM

## 2016-05-31 DIAGNOSIS — M545 Low back pain: Secondary | ICD-10-CM

## 2016-05-31 DIAGNOSIS — E1165 Type 2 diabetes mellitus with hyperglycemia: Secondary | ICD-10-CM | POA: Diagnosis not present

## 2016-05-31 DIAGNOSIS — K219 Gastro-esophageal reflux disease without esophagitis: Secondary | ICD-10-CM | POA: Diagnosis not present

## 2016-05-31 DIAGNOSIS — E1140 Type 2 diabetes mellitus with diabetic neuropathy, unspecified: Secondary | ICD-10-CM | POA: Diagnosis not present

## 2016-05-31 DIAGNOSIS — G8929 Other chronic pain: Secondary | ICD-10-CM

## 2016-05-31 DIAGNOSIS — Z794 Long term (current) use of insulin: Secondary | ICD-10-CM

## 2016-05-31 DIAGNOSIS — I10 Essential (primary) hypertension: Secondary | ICD-10-CM

## 2016-05-31 DIAGNOSIS — Z79899 Other long term (current) drug therapy: Secondary | ICD-10-CM

## 2016-05-31 DIAGNOSIS — M5126 Other intervertebral disc displacement, lumbar region: Secondary | ICD-10-CM

## 2016-05-31 DIAGNOSIS — Z79891 Long term (current) use of opiate analgesic: Secondary | ICD-10-CM

## 2016-05-31 LAB — GLUCOSE, CAPILLARY: Glucose-Capillary: 149 mg/dL — ABNORMAL HIGH (ref 65–99)

## 2016-05-31 MED ORDER — PANTOPRAZOLE SODIUM 40 MG PO TBEC: 40.0000 mg | DELAYED_RELEASE_TABLET | Freq: Two times a day (BID) | ORAL | 1 refills | Status: DC

## 2016-05-31 MED ORDER — OXYCODONE-ACETAMINOPHEN 5-325 MG PO TABS: 1.0000 | ORAL_TABLET | Freq: Three times a day (TID) | ORAL | 0 refills | Status: DC | PRN

## 2016-05-31 NOTE — Patient Instructions (Addendum)
[name] it was nice seeing you today.  -Use Victoza 1.8 mg daily as instructed.   -I encourage you to eat healthy and exercise regularly.   -I have referred you to gastroenterology. Our office will set up the appointment for you.   -Continue taking Protonix 40 mg twice daily as instructed.

## 2016-06-01 NOTE — Assessment & Plan Note (Signed)
Assessment During her previous visit, dose of Protonix was increased to 40 mg twice daily. Patient states she still continues to have GERD symptoms and epigastric pain despite taking a higher dose of this medication. States she usually feels better after eating crackers or drinking ginger ale. Denies having any nausea, vomiting, or diarrhea. Denies having any hematemesis, hematochezia, or melena. EGD done in November 2010 showing mild antral gastritis. Differentials for her current presentation include peptic ulcer disease, gastritis, and dyspepsia.   Plan -Since patient has not responded to Protonix 40 mg twice daily, will refer her to GI for a repeat EGD. Records show she has been on intermittent PPI therapy since 2012. -Continue Protonix 40 mg twice a day for now

## 2016-06-01 NOTE — Progress Notes (Signed)
   CC: Patient is here to discuss GERD, diabetes, hypertension, and chronic lower back pain.  HPI:  [name] is a 55 y.o. female with a past medical history of conditions listed below presenting to the clinic to discuss GERD, diabetes, hypertension, and chronic lower back pain. Please see problem based charting for the status of the patient's current and chronic medical conditions.   Past Medical History:  Diagnosis Date  . Asthma    AS CHILD  . Chronic back pain   . Chronic leg pain    due to back pain  . Cocaine abuse    in remission  . 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 GERD (gastroesophageal reflux disease)   . Hyperlipidemia   . Hypertension   . RECTAL BLEEDING 12/09/2008   Annotation: s/p EGD 7/08 mild gastritis, s/p colonoscopy 7/08- benign polyp  s/p polypectomy and isolated diverticulum.  Qualifier: Diagnosis of  By: Ditzler RN, Debra    . Sleep apnea    CPAP    YEARS AGO DONE 1/2 YEARS AGO AND WAS TOLD DID NOT HAVE  . Tobacco abuse   . Uterine fibroid    s/p hysterectomy    Review of Systems:  Pertinent positives mentioned in HPI. Remainder of all ROS negative.   Physical Exam:  Vitals:   05/31/16 1331  BP: 125/61  Pulse: (!) 104  Temp: 98.9 F (37.2 C)  TempSrc: Oral  SpO2: 100%  Weight: 261 lb 14.4 oz (118.8 kg)  Height: 5\' 4"  (1.626 m)   Physical Exam  Constitutional: She is oriented to person, place, and time. No distress.  Morbidly obese  HENT:  Head: Normocephalic and atraumatic.  Mouth/Throat: Oropharynx is clear and moist.  Eyes: EOM are normal.  Neck: Neck supple. No tracheal deviation present.  Cardiovascular: Normal rate, regular rhythm and intact distal pulses.   Pulmonary/Chest: Effort normal and breath sounds normal. No respiratory distress. She has no wheezes. She has no rales.  Abdominal: Soft. Bowel sounds are normal. She exhibits no distension. There is tenderness. There is no rebound and no guarding.  Epigastrium tender to  palpation  Musculoskeletal: She exhibits no edema.  Neurological: She is alert and oriented to person, place, and time.  Skin: Skin is warm and dry.    Assessment & Plan:   See Encounters Tab for problem based charting.  Patient discussed with Dr. Daryll Drown

## 2016-06-01 NOTE — Assessment & Plan Note (Signed)
BP Readings from Last 3 Encounters:  05/31/16 125/61  04/19/16 (!) 105/55  02/18/16 118/77    Lab Results  Component Value Date   NA 143 04/19/2016   K 3.3 (L) 04/19/2016   CREATININE 0.59 04/19/2016    Assessment: Blood pressure control:  below goal Comments: Currently on hydrochlorothiazide 25 mg daily. Also taking potassium 20 mEq twice daily as she had a low potassium on labs during previous visit on 04/19/2016.   Plan: Medications:  continue current medications Educational resources provided: Educated patient about healthy eating and exercise. Emphasized the importance of weight loss.  Other plans:  -BMET at this visit to check potassium level. Will adjust dose of supplement accordingly.

## 2016-06-01 NOTE — Assessment & Plan Note (Signed)
Lab Results  Component Value Date   HGBA1C 8.5 04/19/2016   HGBA1C 8.6 01/19/2016   HGBA1C 8.6 10/20/2015     Assessment: Diabetes control:  above goal Progress toward A1C goal:   barely improved Comments: Current medication regimen includes NovoLog 70/30 80 units twice daily, metformin 1000 mg twice daily, Januvia 100 mg daily, and Victoza 1.8 mg daily. Dose of Victoza was increased from 1.2 mg to 1.8 mg daily during her previous visit on 04/19/2016. However, patient still continues to use Victoza 1.2 mg daily. Blood glucose meter showing an average value of 179, range 100s-200s. She is checking her blood glucose 2-3 times a day on average. She has gained 6 pounds since her last visit.  Plan: Medications: Advised patient to start using Victoza 1.8 mg daily. Continue metformin, Januvia, and NovoLog 70/30 as above. Home glucose monitoring: Frequency:  3 times a day Timing:  before meals Instruction/counseling given: reminded to get eye exam, reminded to bring blood glucose meter & log to each visit, reminded to bring medications to each visit, discussed foot care, discussed the need for weight loss and discussed diet Self management tools provided: copy of home glucose meter download Other plans:  -Repeat A1c in 3 months

## 2016-06-01 NOTE — Assessment & Plan Note (Signed)
Assessment Patient has a history of chronic lower back pain secondary to herniated lumbar disc. She is currently on Percocet 5-325 mg 1 tablet every 8 hours as needed #50 per month. Last filled 03/21/2016. Patient is requesting a refill on this medication stating it helps her function better and controls her pain.  Plan -Refill Percocet 5-325 mg 1 tablet every 8 hours as needed #50 per month

## 2016-06-02 ENCOUNTER — Encounter: Payer: Self-pay | Admitting: Internal Medicine

## 2016-06-02 DIAGNOSIS — M6740 Ganglion, unspecified site: Secondary | ICD-10-CM | POA: Diagnosis not present

## 2016-06-02 DIAGNOSIS — M654 Radial styloid tenosynovitis [de Quervain]: Secondary | ICD-10-CM | POA: Diagnosis not present

## 2016-06-03 NOTE — Progress Notes (Signed)
Internal Medicine Clinic Attending  Case discussed with Dr. Rathore soon after the resident saw the patient.  We reviewed the resident's history and exam and pertinent patient test results.  I agree with the assessment, diagnosis, and plan of care documented in the resident's note.  

## 2016-06-07 DIAGNOSIS — M179 Osteoarthritis of knee, unspecified: Secondary | ICD-10-CM | POA: Diagnosis not present

## 2016-06-07 DIAGNOSIS — E119 Type 2 diabetes mellitus without complications: Secondary | ICD-10-CM | POA: Diagnosis not present

## 2016-06-07 DIAGNOSIS — M545 Low back pain: Secondary | ICD-10-CM | POA: Diagnosis not present

## 2016-06-08 ENCOUNTER — Encounter: Payer: Self-pay | Admitting: Gastroenterology

## 2016-06-21 ENCOUNTER — Ambulatory Visit: Payer: PPO | Admitting: Gastroenterology

## 2016-06-22 ENCOUNTER — Other Ambulatory Visit: Payer: Self-pay | Admitting: Dietician

## 2016-06-22 DIAGNOSIS — E1140 Type 2 diabetes mellitus with diabetic neuropathy, unspecified: Secondary | ICD-10-CM

## 2016-06-22 DIAGNOSIS — Z794 Long term (current) use of insulin: Principal | ICD-10-CM

## 2016-06-22 MED ORDER — GLUCOSE BLOOD VI STRP: ORAL_STRIP | 12 refills | Status: DC

## 2016-06-22 MED ORDER — ONETOUCH DELICA LANCETS FINE MISC: 12 refills | Status: DC

## 2016-06-22 MED ORDER — ONETOUCH VERIO W/DEVICE KIT: 1.0000 | PACK | Freq: Three times a day (TID) | 0 refills | Status: DC

## 2016-06-22 NOTE — Telephone Encounter (Signed)
Has new insurance and wants new prescription fopr  Meter and supplies so she can get them for free. She would like a One touch Verio meter, strips and lancets, she checks  3x/day and wants the prescriptions sent to [place] at [facility].

## 2016-07-05 ENCOUNTER — Ambulatory Visit: Payer: PPO | Admitting: Gastroenterology

## 2016-07-26 ENCOUNTER — Other Ambulatory Visit: Payer: PPO

## 2016-07-26 ENCOUNTER — Encounter: Payer: Self-pay | Admitting: Gastroenterology

## 2016-07-26 ENCOUNTER — Encounter (INDEPENDENT_AMBULATORY_CARE_PROVIDER_SITE_OTHER): Payer: Self-pay

## 2016-07-26 ENCOUNTER — Ambulatory Visit (INDEPENDENT_AMBULATORY_CARE_PROVIDER_SITE_OTHER): Payer: PPO | Admitting: Gastroenterology

## 2016-07-26 VITALS — BP 136/84 | HR 76 | Ht 64.0 in | Wt 258.8 lb

## 2016-07-26 DIAGNOSIS — Z1212 Encounter for screening for malignant neoplasm of rectum: Secondary | ICD-10-CM | POA: Diagnosis not present

## 2016-07-26 DIAGNOSIS — K219 Gastro-esophageal reflux disease without esophagitis: Secondary | ICD-10-CM

## 2016-07-26 DIAGNOSIS — Z1211 Encounter for screening for malignant neoplasm of colon: Secondary | ICD-10-CM | POA: Diagnosis not present

## 2016-07-26 DIAGNOSIS — R1013 Epigastric pain: Secondary | ICD-10-CM

## 2016-07-26 NOTE — Progress Notes (Addendum)
History of Present Illness: This is a 56 year old female referred by Shela Leff, MD for the evaluation of GERD. She was previously followed by Dr. Collene Mares. Patient relates a long history of postprandial epigastric pain that occurs intermittently. Occasionally her symptoms are improved with Tums. The symptoms may last anywhere from a few minutes to several hours. They're occasionally associated with nausea. She's had 2 prior EGDs as outlined below. She takes pantoprazole twice daily for the management of GERD and states heartburn symptoms are controlled with this regimen. Denies weight loss, constipation, diarrhea, change in stool caliber, melena, hematochezia, nausea, vomiting, dysphagia, chest pain.  Barium esophagram 01/2013 IMPRESSION: Mild esophageal dysmotility. Otherwise negative esophagram.  EGD 2010 Dr. Collene Mares for dysphagia, nausea, epigastric pain: mild antral gastritis otherwise normal   Colonoscopy and EGD 12/2006 Dr. Collene Mares no report on chart, pathology showed a hyperplastic colon polyp and benign duodenal mucosa.    Allergies  Allergen Reactions  . Losartan Cough   Outpatient Medications Prior to Visit  Medication Sig Dispense Refill  . acyclovir (ZOVIRAX) 400 MG tablet Take 1 tablet (400 mg total) by mouth 3 (three) times daily. Take medication for a total of 5 days. (Patient taking differently: Take 400 mg by mouth 3 (three) times daily as needed (flares). ) 15 tablet 0  . albuterol (PROVENTIL HFA;VENTOLIN HFA) 108 (90 BASE) MCG/ACT inhaler Inhale 2 puffs into the lungs every 4 (four) hours as needed for wheezing or shortness of breath. 18 g 5  . aspirin 81 MG EC tablet Take 81 mg by mouth daily.      . Blood Glucose Monitoring Suppl (ONETOUCH VERIO) w/Device KIT 1 each by Does not apply route 3 (three) times daily. 1 kit 0  . budesonide-formoterol (SYMBICORT) 80-4.5 MCG/ACT inhaler Inhale 2 puffs into the lungs 2 (two) times daily. (Patient taking differently: Inhale 2 puffs  into the lungs 2 (two) times daily as needed (shortness of breath). ) 1 Inhaler 12  . CALCIUM PO Take 1 tablet by mouth every evening.    . chlorpheniramine (CHLORPHEN) 4 MG tablet Take 1 tab in AM and 2 tabs in PM    . gabapentin (NEURONTIN) 300 MG capsule Take 2 capsules (600 mg total) by mouth at bedtime. 180 capsule 3  . glucose blood (ONETOUCH VERIO) test strip Check blood sugar 3 times a day 100 each 12  . hydrochlorothiazide (HYDRODIURIL) 25 MG tablet Take 1 tablet (25 mg total) by mouth daily. 90 tablet 3  . insulin aspart protamine - aspart (NOVOLOG MIX 70/30 FLEXPEN) (70-30) 100 UNIT/ML FlexPen Inject 0.8 mLs (80 Units total) into the skin 2 (two) times daily. VOID RX FOR HUMULIN 70/30 45 mL 6  . Insulin Pen Needle (B-D UF III MINI PEN NEEDLES) 31G X 5 MM MISC Use to inject insulin twice a day 190 each 5  . liraglutide 18 MG/3ML SOPN Inject 0.3 mLs (1.8 mg total) into the skin daily. 6 mL 5  . metFORMIN (GLUCOPHAGE) 1000 MG tablet TAKE ONE TABLET BY MOUTH TWICE DAILY WITH A MEAL 180 tablet 3  . Multiple Vitamin (MULTIVITAMIN WITH MINERALS) TABS tablet Take 1 tablet by mouth daily.    Glory Rosebush DELICA LANCETS FINE MISC Check blood sugar 3 times a day 100 each 12  . oxyCODONE-acetaminophen (PERCOCET/ROXICET) 5-325 MG tablet Take 1 tablet by mouth every 8 (eight) hours as needed. For back pain. 50 tablet 0  . pantoprazole (PROTONIX) 40 MG tablet Take 1 tablet (40 mg total)  by mouth 2 (two) times daily. 60 tablet 1  . potassium chloride 20 MEQ TBCR Take 20 mEq by mouth 2 (two) times daily. 60 tablet 1  . rosuvastatin (CRESTOR) 20 MG tablet Take 1 tablet (20 mg total) by mouth at bedtime. 90 tablet 3   No facility-administered medications prior to visit.    Past Medical History:  Diagnosis Date  . Asthma    AS CHILD  . Chronic back pain   . Chronic leg pain    due to back pain  . Cocaine abuse    in remission  . Diabetes ([place])    with neuropathy  . GERD (gastroesophageal reflux  disease)   . Hyperlipidemia   . Hypertension   . RECTAL BLEEDING 12/09/2008   Annotation: s/p EGD 7/08 mild gastritis, s/p colonoscopy 7/08- benign polyp  s/p polypectomy and isolated diverticulum.  Qualifier: Diagnosis of  By: Ditzler RN, Debra    . Sleep apnea    CPAP    YEARS AGO DONE 1/2 YEARS AGO AND WAS TOLD DID NOT HAVE  . Tobacco abuse   . Uterine fibroid    s/p hysterectomy   Past Surgical History:  Procedure Laterality Date  . BACK SURGERY  2012   L5-S1 microendoscopic disectomy last surgery 06/2011  . BREAST BIOPSY     LEFT    01/19/16  . BREAST REDUCTION SURGERY  1982  . HAND SURGERY     MIDDLE TRIGGER FINGER RIGHT SIDE  . RADIOACTIVE SEED GUIDED EXCISIONAL BREAST BIOPSY Left 02/18/2016   Procedure: LEFT RADIOACTIVE SEED GUIDED EXCISIONAL BREAST BIOPSY;  Surgeon: Alphonsa Overall, MD;  Location: [facility];  Service: General;  Laterality: Left;  . TONSILLECTOMY  2008  . TOTAL ABDOMINAL HYSTERECTOMY  05/24/2007   hysterectomy  . TUBAL LIGATION     Social History   Social History  . Marital status: Married    Spouse name: N/A  . Number of children: 3  . Years of education: N/A   Occupational History  . [employer]   Social History Main Topics  . Smoking status: Former Smoker    Packs/day: 0.50    Years: 20.00    Types: Cigarettes    Quit date: 07/23/1998  . Smokeless tobacco: Never Used  . Alcohol use No     Comment: recovering addict clean for 9 years  . Drug use: No     Comment: recovering addict clean for 9 years  . Sexual activity: Not Asked   Other Topics Concern  . None   Social History Narrative   Unemployed but has started taking community college classes (interested in accounting) since having back surgery, which has improved pain and mobility. Hopes to return to work soon. Lives with husband and daughter.  Uninsured.   Family History  Problem Relation Age of Onset  . Diabetes Father   . Heart disease Father   .  Hypertension Father   . Diabetes Mother   . Cancer Mother     brain  . Hypertension Mother   . Kidney disease Sister   . Diabetes Sister       Review of Systems: Pertinent positive and negative review of systems were noted in the above HPI section. All other review of systems were otherwise negative.   Physical Exam: General: Well developed, well nourished, no acute distress Head: Normocephalic and atraumatic Eyes:  sclerae anicteric, EOMI Ears: Normal auditory acuity Mouth: No deformity or lesions Neck: Supple, no masses or  thyromegaly Lungs: Clear throughout to auscultation Heart: Regular rate and rhythm; no murmurs, rubs or bruits Abdomen: Soft, non tender and non distended. No masses, hepatosplenomegaly or hernias noted. Normal Bowel sounds Rectal: not done Musculoskeletal: Symmetrical with no gross deformities  Skin: No lesions on visible extremities Pulses:  Normal pulses noted Extremities: No clubbing, cyanosis, edema or deformities noted Neurological: Alert oriented x 4, grossly nonfocal Cervical Nodes:  No significant cervical adenopathy Inguinal Nodes: No significant inguinal adenopathy Psychological:  Alert and cooperative. Normal mood and affect  Assessment and Recommendations:  1. Epigastric pain, intermittent, postprandial. Rule out cholelithiasis. Obtain CBC, CMP, TSH and lipase today. Schedule abdominal ultrasound. Continue pantoprazole twice daily. Schedule EGD if ultrasound is not diagnostic.  2. GERD. Continue standard antireflux measures and pantoprazole 40 mg twice daily. Continue Tums when necessary.  3. CRC screening, average risk. 10 year interval surveillance colonoscopy due in July 2018. Attempt to obtain records from prior colonoscopy.  08/08/2016 Records received and reviewed from Dr. Collene Mares. Unfortunately no colonoscopy report received. Will request again.   cc: Shela Leff, MD [address] [place], [place] [address]

## 2016-07-26 NOTE — Patient Instructions (Signed)
Your physician has requested that you go to the basement for lab work before leaving today.  You have been scheduled for an abdominal ultrasound at [facility] (1st floor of hospital) on 08/09/16 at 9:30am. Please arrive 15 minutes prior to your appointment for registration. Make certain not to have anything to eat or drink 6 hours prior to your appointment. Should you need to reschedule your appointment, please contact radiology at [phone]. This test typically takes about 30 minutes to perform.  Normal BMI (Body Mass Index- based on height and weight) is between 19 and 25. Your BMI today is Body mass index is 44.42 kg/m. Marland Kitchen Please consider follow up  regarding your BMI with your Primary Care Provider.  Thank you for choosing me and [facility].  Pricilla Riffle. Dagoberto Ligas., MD., Marval Regal

## 2016-07-27 ENCOUNTER — Other Ambulatory Visit (INDEPENDENT_AMBULATORY_CARE_PROVIDER_SITE_OTHER): Payer: PPO

## 2016-07-27 DIAGNOSIS — R1013 Epigastric pain: Secondary | ICD-10-CM | POA: Diagnosis not present

## 2016-07-27 LAB — COMPREHENSIVE METABOLIC PANEL
ALK PHOS: 69 U/L (ref 39–117)
ALT: 60 U/L — ABNORMAL HIGH (ref 0–35)
AST: 75 U/L — ABNORMAL HIGH (ref 0–37)
Albumin: 4.4 g/dL (ref 3.5–5.2)
BUN: 6 mg/dL (ref 6–23)
CHLORIDE: 101 meq/L (ref 96–112)
CO2: 34 mEq/L — ABNORMAL HIGH (ref 19–32)
Calcium: 9.9 mg/dL (ref 8.4–10.5)
Creatinine, Ser: 0.74 mg/dL (ref 0.40–1.20)
GFR: 104.42 mL/min (ref >60.00)
GLUCOSE: 146 mg/dL — AB (ref 70–99)
POTASSIUM: 3.6 meq/L (ref 3.5–5.1)
SODIUM: 143 meq/L (ref 135–145)
TOTAL PROTEIN: 8.2 g/dL (ref 6.0–8.3)
Total Bilirubin: 0.3 mg/dL (ref 0.2–1.2)

## 2016-07-27 LAB — CBC WITH DIFFERENTIAL/PLATELET
BASOS PCT: 0.8 % (ref 0.0–3.0)
Basophils Absolute: 0.1 10*3/uL (ref 0.0–0.1)
EOS PCT: 2.3 % (ref 0.0–5.0)
Eosinophils Absolute: 0.2 10*3/uL (ref 0.0–0.7)
HCT: 43 % (ref 36.0–46.0)
HEMOGLOBIN: 13.7 g/dL (ref 12.0–15.0)
LYMPHS ABS: 2.8 10*3/uL (ref 0.7–4.0)
Lymphocytes Relative: 42.6 % (ref 12.0–46.0)
MCHC: 31.8 g/dL (ref 30.0–36.0)
MCV: 78.8 fl (ref 78.0–100.0)
MONOS PCT: 5.9 % (ref 3.0–12.0)
Monocytes Absolute: 0.4 10*3/uL (ref 0.1–1.0)
Neutro Abs: 3.1 10*3/uL (ref 1.4–7.7)
Neutrophils Relative %: 48.4 % (ref 43.0–77.0)
Platelets: 286 10*3/uL (ref 150.0–400.0)
RBC: 5.45 Mil/uL — AB (ref 3.87–5.11)
RDW: 15.7 % — ABNORMAL HIGH (ref 11.5–15.5)
WBC: 6.5 10*3/uL (ref 4.0–10.5)

## 2016-07-27 LAB — LIPASE: Lipase: 47 U/L (ref 11.0–59.0)

## 2016-07-27 LAB — TSH: TSH: 1.36 u[IU]/mL (ref 0.35–4.50)

## 2016-07-28 ENCOUNTER — Other Ambulatory Visit: Payer: Self-pay

## 2016-07-28 DIAGNOSIS — R7989 Other specified abnormal findings of blood chemistry: Secondary | ICD-10-CM

## 2016-07-28 DIAGNOSIS — R945 Abnormal results of liver function studies: Principal | ICD-10-CM

## 2016-08-02 NOTE — Telephone Encounter (Signed)
Opened in error

## 2016-08-02 NOTE — Addendum Note (Signed)
Addended by: Shela Leff on: 08/02/2016 01:15 PM   Modules accepted: Orders

## 2016-08-07 ENCOUNTER — Ambulatory Visit (HOSPITAL_COMMUNITY)
Admission: EM | Admit: 2016-08-07 | Discharge: 2016-08-07 | Disposition: A | Discharge status: Home / Self Care | Payer: PPO | Attending: Family Medicine | Admitting: Family Medicine

## 2016-08-07 ENCOUNTER — Encounter (HOSPITAL_COMMUNITY): Payer: Self-pay | Admitting: Family Medicine

## 2016-08-07 DIAGNOSIS — J069 Acute upper respiratory infection, unspecified: Secondary | ICD-10-CM

## 2016-08-07 DIAGNOSIS — B9789 Other viral agents as the cause of diseases classified elsewhere: Secondary | ICD-10-CM | POA: Diagnosis not present

## 2016-08-07 DIAGNOSIS — J449 Chronic obstructive pulmonary disease, unspecified: Secondary | ICD-10-CM

## 2016-08-07 DIAGNOSIS — A6000 Herpesviral infection of urogenital system, unspecified: Secondary | ICD-10-CM

## 2016-08-07 MED ORDER — ACYCLOVIR 400 MG PO TABS: 400.0000 mg | ORAL_TABLET | Freq: Three times a day (TID) | ORAL | 3 refills | Status: DC | PRN

## 2016-08-07 MED ORDER — IBUPROFEN 600 MG PO TABS: 600.0000 mg | ORAL_TABLET | Freq: Four times a day (QID) | ORAL | 0 refills | Status: DC | PRN

## 2016-08-07 MED ORDER — BENZONATATE 100 MG PO CAPS: 100.0000 mg | ORAL_CAPSULE | Freq: Three times a day (TID) | ORAL | 0 refills | Status: DC | PRN

## 2016-08-07 MED ORDER — BUDESONIDE-FORMOTEROL FUMARATE 80-4.5 MCG/ACT IN AERO: 2.0000 | INHALATION_SPRAY | Freq: Two times a day (BID) | RESPIRATORY_TRACT | 5 refills | Status: DC | PRN

## 2016-08-07 NOTE — ED Triage Notes (Signed)
The patient presented to the [facility] with a complaint of a runny nose, headache and general body aches x 4 days.

## 2016-08-07 NOTE — ED Provider Notes (Signed)
[place]    CSN: [id number] Arrival date & time: 08/07/16  1201     History   Chief Complaint Chief Complaint  Patient presents with  . Nasal Congestion  . Generalized Body Aches    HPI [name] is a 56 y.o. female.   This a 56 year old woman with cold symptoms who presents to the [facility] urgent care center. This patient has past medical history of diabetes, hypertension, substance abuse, chronic back pain, asthma, and hyperlipidemia. Patient states that her symptoms began on Wednesday, 4 days ago. She has cough, scratchy throat, and diffuse aches.  Patient works at [employer] as an Web designer. She no longer smokes. She has enough of her asthma medicine      Past Medical History:  Diagnosis Date  . Asthma    AS CHILD  . Chronic back pain   . Chronic leg pain    due to back pain  . Cocaine abuse    in remission  . Diabetes ([place])    with neuropathy  . GERD (gastroesophageal reflux disease)   . Hyperlipidemia   . Hypertension   . RECTAL BLEEDING 12/09/2008   Annotation: s/p EGD 7/08 mild gastritis, s/p colonoscopy 7/08- benign polyp  s/p polypectomy and isolated diverticulum.  Qualifier: Diagnosis of  By: Ditzler RN, Debra    . Sleep apnea    CPAP    YEARS AGO DONE 1/2 YEARS AGO AND WAS TOLD DID NOT HAVE  . Tobacco abuse   . Uterine fibroid    s/p hysterectomy    Patient Active Problem List   Diagnosis Date Noted  . Opioid dependence ([place]) 05/23/2016  . Nipple discharge 01/20/2016  . Numbness of leg 01/20/2016  . Right lower quadrant abdominal pain 01/20/2016  . Acne 10/22/2015  . Diabetic neuropathy with neurologic complication ([place]) 48/06/6551  . Long term current use of opiate analgesic 07/21/2015  . Preventative health care 05/21/2015  . Lipoma 02/18/2015  . 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 Primary snoring 06/04/2013  . COPD (chronic obstructive  pulmonary disease) Moderate  05/31/2013  . Lung nodule 05/31/2013  . Type 2 diabetes mellitus with diabetic neuropathy ([place]) 08/25/2011  . Back pain 11/15/2010  . Essential hypertension 07/07/2009  . GERD 01/08/2009  . Hyperlipidemia 12/09/2008  . OBESITY, MORBID 12/09/2008    Past Surgical History:  Procedure Laterality Date  . BACK SURGERY  2012   L5-S1 microendoscopic disectomy last surgery 06/2011  . BREAST BIOPSY     LEFT    01/19/16  . BREAST REDUCTION SURGERY  1982  . HAND SURGERY     MIDDLE TRIGGER FINGER RIGHT SIDE  . RADIOACTIVE SEED GUIDED EXCISIONAL BREAST BIOPSY Left 02/18/2016   Procedure: LEFT RADIOACTIVE SEED GUIDED EXCISIONAL BREAST BIOPSY;  Surgeon: Alphonsa Overall, MD;  Location: [facility];  Service: General;  Laterality: Left;  . TONSILLECTOMY  2008  . TOTAL ABDOMINAL HYSTERECTOMY  05/24/2007   hysterectomy  . TUBAL LIGATION      OB History    Gravida Para Term Preterm AB Living   _0 SAB TAB Ectopic Multiple Live Births     2             Home Medications    Prior to Admission medications   Medication Sig Start Date End Date Taking? Authorizing Provider  acyclovir (ZOVIRAX) 400 MG tablet Take 1  tablet (400 mg total) by mouth 3 (three) times daily as needed (flares). 08/07/16   Robyn Haber, MD  albuterol (PROVENTIL HFA;VENTOLIN HFA) 108 (90 BASE) MCG/ACT inhaler Inhale 2 puffs into the lungs every 4 (four) hours as needed for wheezing or shortness of breath. 05/19/15   Shela Leff, MD  aspirin 81 MG EC tablet Take 81 mg by mouth daily.      Historical Provider, MD  benzonatate (TESSALON) 100 MG capsule Take 1-2 capsules (100-200 mg total) by mouth 3 (three) times daily as needed for cough. 08/07/16   Robyn Haber, MD  Blood Glucose Monitoring Suppl (ONETOUCH VERIO) w/Device KIT 1 each by Does not apply route 3 (three) times daily. 06/22/16   Shela Leff, MD  budesonide-formoterol (SYMBICORT) 80-4.5 MCG/ACT inhaler Inhale 2 puffs into the  lungs 2 (two) times daily as needed (shortness of breath). 08/07/16   Robyn Haber, MD  CALCIUM PO Take 1 tablet by mouth every evening.    Historical Provider, MD  chlorpheniramine (CHLORPHEN) 4 MG tablet Take 1 tab in AM and 2 tabs in PM    Historical Provider, MD  gabapentin (NEURONTIN) 300 MG capsule Take 2 capsules (600 mg total) by mouth at bedtime. 01/19/16   Shela Leff, MD  glucose blood (ONETOUCH VERIO) test strip Check blood sugar 3 times a day 06/22/16   Shela Leff, MD  hydrochlorothiazide (HYDRODIURIL) 25 MG tablet Take 1 tablet (25 mg total) by mouth daily. 01/04/16   Shela Leff, MD  ibuprofen (ADVIL,MOTRIN) 600 MG tablet Take 1 tablet (600 mg total) by mouth every 6 (six) hours as needed. 08/07/16   Robyn Haber, MD  insulin aspart protamine - aspart (NOVOLOG MIX 70/30 FLEXPEN) (70-30) 100 UNIT/ML FlexPen Inject 0.8 mLs (80 Units total) into the skin 2 (two) times daily. VOID RX FOR HUMULIN 70/30 01/19/16   Shela Leff, MD  Insulin Pen Needle (B-D UF III MINI PEN NEEDLES) 31G X 5 MM MISC Use to inject insulin twice a day 04/19/16   Shela Leff, MD  liraglutide 18 MG/3ML SOPN Inject 0.3 mLs (1.8 mg total) into the skin daily. 04/19/16   Shela Leff, MD  metFORMIN (GLUCOPHAGE) 1000 MG tablet TAKE ONE TABLET BY MOUTH TWICE DAILY WITH A MEAL 03/01/16   Shela Leff, MD  Multiple Vitamin (MULTIVITAMIN WITH MINERALS) TABS tablet Take 1 tablet by mouth daily.    Historical Provider, MD  [facility] DELICA LANCETS FINE MISC Check blood sugar 3 times a day 06/22/16   Shela Leff, MD  oxyCODONE-acetaminophen (PERCOCET/ROXICET) 5-325 MG tablet Take 1 tablet by mouth every 8 (eight) hours as needed. For back pain. 05/31/16   Shela Leff, MD  pantoprazole (PROTONIX) 40 MG tablet Take 1 tablet (40 mg total) by mouth 2 (two) times daily. 05/31/16   Shela Leff, MD  potassium chloride 20 MEQ TBCR Take 20 mEq by mouth 2 (two) times daily. 04/20/16    Shela Leff, MD  rosuvastatin (CRESTOR) 20 MG tablet Take 1 tablet (20 mg total) by mouth at bedtime. 01/04/16 01/03/17  Shela Leff, MD    Family History Family History  Problem Relation Age of Onset  . Diabetes Father   . Heart disease Father   . Hypertension Father   . Diabetes Mother   . Cancer Mother     brain  . Hypertension Mother   . Kidney disease Sister   . Diabetes Sister     Social History Social History  Substance Use Topics  . Smoking status: Former Smoker  Packs/day: 0.50    Years: 20.00    Types: Cigarettes    Quit date: 07/23/1998  . Smokeless tobacco: Never Used  . Alcohol use No     Comment: recovering addict clean for 9 years     Allergies   Losartan   Review of Systems Review of Systems  Constitutional: Negative.   HENT: Positive for congestion.   Respiratory: Positive for cough.   Gastrointestinal: Negative.   Musculoskeletal: Positive for myalgias.     Physical Exam Triage Vital Signs ED Triage Vitals  Enc Vitals Group     BP      Pulse      Resp      Temp      Temp src      SpO2      Weight      Height      Head Circumference      Peak Flow      Pain Score      Pain Loc      Pain Edu?      Excl. in [place]?    No data found.   Updated Vital Signs BP 132/62 (BP Location: Right Arm)   Pulse 95   Temp 98.5 F (36.9 C) (Oral)   Resp 16   SpO2 98%   Physical Exam  Constitutional: She is oriented to person, place, and time. She appears well-developed and well-nourished.  HENT:  Head: Normocephalic.  Right Ear: External ear normal.  Left Ear: External ear normal.  Mouth/Throat: Oropharynx is clear and moist.  Eyes: Conjunctivae and EOM are normal. Pupils are equal, round, and reactive to light.  Neck: Normal range of motion. Neck supple.  Pulmonary/Chest: Effort normal and breath sounds normal.  Musculoskeletal: Normal range of motion.  Neurological: She is alert and oriented to person, place, and time.    Skin: Skin is warm and dry.  Nursing note and vitals reviewed.    UC Treatments / Results  Labs (all labs ordered are listed, but only abnormal results are displayed) Labs Reviewed - No data to display  EKG  EKG Interpretation None       Radiology No results found.  Procedures Procedures (including critical care time)  Medications Ordered in UC Medications - No data to display   Initial Impression / Assessment and Plan / UC Course  I have reviewed the triage vital signs and the nursing notes.  Pertinent labs & imaging results that were available during my care of the patient were reviewed by me and considered in my medical decision making (see chart for details).     Final Clinical Impressions(s) / UC Diagnoses   Final diagnoses:  Viral upper respiratory tract infection    New Prescriptions New Prescriptions   BENZONATATE (TESSALON) 100 MG CAPSULE    Take 1-2 capsules (100-200 mg total) by mouth 3 (three) times daily as needed for cough.   IBUPROFEN (ADVIL,MOTRIN) 600 MG TABLET    Take 1 tablet (600 mg total) by mouth every 6 (six) hours as needed.     Robyn Haber, MD 08/07/16 [phone]

## 2016-08-09 ENCOUNTER — Telehealth: Payer: Self-pay

## 2016-08-09 ENCOUNTER — Ambulatory Visit (HOSPITAL_COMMUNITY): Admission: RE | Admit: 2016-08-09 | Payer: PPO | Source: Ambulatory Visit

## 2016-08-09 NOTE — Telephone Encounter (Signed)
After Dr. Fuller Plan reviewed records from previous colon/endo reports from 12/2006 done Dr. Collene Mares. Pt is due for an Colonoscopy per Dr. Fuller Plan. Left a message for patient to return my call.

## 2016-08-10 NOTE — Telephone Encounter (Signed)
Left a message for patient to return my call. 

## 2016-08-12 ENCOUNTER — Other Ambulatory Visit: Payer: Self-pay

## 2016-08-12 NOTE — Telephone Encounter (Signed)
Left a message for patient to return my call. Letter to be mailed to patient.  

## 2016-08-18 ENCOUNTER — Telehealth: Payer: Self-pay | Admitting: Gastroenterology

## 2016-08-18 NOTE — Telephone Encounter (Signed)
Patient is provided the number to radiology scheduling to reschedule her missed [place] appt. She is also scheduled for a colonoscopy per Dr. Lynne Leader recommendations.

## 2016-08-29 ENCOUNTER — Ambulatory Visit (HOSPITAL_COMMUNITY)
Admission: RE | Admit: 2016-08-29 | Discharge: 2016-08-29 | Disposition: A | Discharge status: Home / Self Care | Payer: PPO | Source: Ambulatory Visit | Attending: Gastroenterology | Admitting: Gastroenterology

## 2016-08-29 ENCOUNTER — Telehealth: Payer: Self-pay | Admitting: Internal Medicine

## 2016-08-29 DIAGNOSIS — K760 Fatty (change of) liver, not elsewhere classified: Secondary | ICD-10-CM | POA: Insufficient documentation

## 2016-08-29 DIAGNOSIS — N281 Cyst of kidney, acquired: Secondary | ICD-10-CM | POA: Insufficient documentation

## 2016-08-29 DIAGNOSIS — R1013 Epigastric pain: Secondary | ICD-10-CM | POA: Diagnosis not present

## 2016-08-29 NOTE — Telephone Encounter (Signed)
APT. REMINDER CALL, LMTCB °

## 2016-08-29 NOTE — Assessment & Plan Note (Signed)
CT chest done 12/2013 showing hepatic steatosis.

## 2016-08-30 ENCOUNTER — Encounter: Payer: Self-pay | Admitting: Internal Medicine

## 2016-08-30 ENCOUNTER — Ambulatory Visit (INDEPENDENT_AMBULATORY_CARE_PROVIDER_SITE_OTHER): Payer: PPO | Admitting: Internal Medicine

## 2016-08-30 VITALS — BP 139/92 | HR 92 | Temp 98.2°F | Wt 259.3 lb

## 2016-08-30 DIAGNOSIS — Z760 Encounter for issue of repeat prescription: Secondary | ICD-10-CM

## 2016-08-30 DIAGNOSIS — Z794 Long term (current) use of insulin: Secondary | ICD-10-CM | POA: Diagnosis not present

## 2016-08-30 DIAGNOSIS — K760 Fatty (change of) liver, not elsewhere classified: Secondary | ICD-10-CM | POA: Diagnosis not present

## 2016-08-30 DIAGNOSIS — E1142 Type 2 diabetes mellitus with diabetic polyneuropathy: Secondary | ICD-10-CM | POA: Diagnosis not present

## 2016-08-30 DIAGNOSIS — M545 Low back pain: Secondary | ICD-10-CM

## 2016-08-30 DIAGNOSIS — I10 Essential (primary) hypertension: Secondary | ICD-10-CM | POA: Diagnosis not present

## 2016-08-30 DIAGNOSIS — E1149 Type 2 diabetes mellitus with other diabetic neurological complication: Secondary | ICD-10-CM

## 2016-08-30 DIAGNOSIS — Z87891 Personal history of nicotine dependence: Secondary | ICD-10-CM

## 2016-08-30 DIAGNOSIS — M549 Dorsalgia, unspecified: Secondary | ICD-10-CM | POA: Diagnosis not present

## 2016-08-30 DIAGNOSIS — N6452 Nipple discharge: Secondary | ICD-10-CM | POA: Diagnosis not present

## 2016-08-30 DIAGNOSIS — G8929 Other chronic pain: Secondary | ICD-10-CM

## 2016-08-30 DIAGNOSIS — Z79891 Long term (current) use of opiate analgesic: Secondary | ICD-10-CM

## 2016-08-30 DIAGNOSIS — K219 Gastro-esophageal reflux disease without esophagitis: Secondary | ICD-10-CM

## 2016-08-30 DIAGNOSIS — Z79899 Other long term (current) drug therapy: Secondary | ICD-10-CM

## 2016-08-30 DIAGNOSIS — E1165 Type 2 diabetes mellitus with hyperglycemia: Secondary | ICD-10-CM

## 2016-08-30 DIAGNOSIS — Z6841 Body Mass Index (BMI) 40.0 and over, adult: Secondary | ICD-10-CM

## 2016-08-30 DIAGNOSIS — E1140 Type 2 diabetes mellitus with diabetic neuropathy, unspecified: Secondary | ICD-10-CM

## 2016-08-30 LAB — POCT GLYCOSYLATED HEMOGLOBIN (HGB A1C): HEMOGLOBIN A1C: 8

## 2016-08-30 LAB — GLUCOSE, CAPILLARY: Glucose-Capillary: 107 mg/dL — ABNORMAL HIGH (ref 65–99)

## 2016-08-30 MED ORDER — PANTOPRAZOLE SODIUM 40 MG PO TBEC: 40.0000 mg | DELAYED_RELEASE_TABLET | Freq: Two times a day (BID) | ORAL | 0 refills | Status: DC

## 2016-08-30 MED ORDER — LIRAGLUTIDE 18 MG/3ML ~~LOC~~ SOPN: 1.8000 mg | PEN_INJECTOR | Freq: Every day | SUBCUTANEOUS | 5 refills | Status: DC

## 2016-08-30 MED ORDER — OXYCODONE-ACETAMINOPHEN 5-325 MG PO TABS
1.0000 | ORAL_TABLET | Freq: Three times a day (TID) | ORAL | 0 refills | Status: DC | PRN
Start: 2016-08-30 — End: 2016-10-12

## 2016-08-30 MED ORDER — GABAPENTIN 300 MG PO CAPS: 900.0000 mg | ORAL_CAPSULE | Freq: Every day | ORAL | 3 refills | Status: DC

## 2016-08-30 NOTE — Patient Instructions (Signed)
Take Gabapentin 900 mg at bedtime as instructed.   Continue using your diabetes medicines as before.  I encourage you to eat healthy and exercise.

## 2016-08-31 NOTE — Assessment & Plan Note (Signed)
BP Readings from Last 3 Encounters:  08/30/16 (!) 139/92  08/07/16 132/62  07/26/16 136/84    Lab Results  Component Value Date   NA 143 07/27/2016   K 3.6 07/27/2016   CREATININE 0.74 07/27/2016    Assessment: Blood pressure control:  at goal Comments: Currently on hydrochlorothiazide 25 mg daily. Patient was previously prescribed potassium supplement back in November 2017 for a 2 month supply. Potassium checked on 07/27/2016 was normal.  Plan: Medications:  continue current medications Educational resources provided:   Educated patient about healthy eating and exercise. Emphasized the importance of weight loss.  Other plans:  -Recheck potassium level in one year

## 2016-08-31 NOTE — Assessment & Plan Note (Signed)
A Patient is followed by Dr. Lucia Gaskins.  P -Our office will request records from Dr. Pollie Friar office.

## 2016-08-31 NOTE — Assessment & Plan Note (Addendum)
A Her chronic back pain is well-controlled with Percocet. Patient states this medication helps her function better. Review of [place] controlled substance database did not show any red flags. This medication was last prescribed on 05/31/2016 and filled on 06/04/2016 for #50 tablets.  P -Refill Percocet 5-325 mg 1 tablet every 8 hours as needed, #50

## 2016-08-31 NOTE — Assessment & Plan Note (Signed)
A Patient continues to lead a sedentary lifestyle. She has gained 1 pound in the past 1 month.  P -Educated patient about healthy eating and exercise. Emphasized the importance of weight loss.

## 2016-08-31 NOTE — Progress Notes (Addendum)
   CC: Patient is requesting refills on medications. Hypertension, GERD, diabetes, and diabetic peripheral neuropathy also discussed during this visit.  HPI:  [name] is a 56 y.o. female with a past medical history of conditions listed below presenting to the clinic requesting refills on medications. Hypertension, GERD, diabetes, and diabetic peripheral neuropathy were also discussed during this visit. Please see problem based charting for the status of the patient's current and chronic medical conditions.   Past Medical History:  Diagnosis Date  . Asthma    AS CHILD  . Chronic back pain   . Chronic leg pain    due to back pain  . Cocaine abuse    in remission  . Diabetes ([place])    with neuropathy  . GERD (gastroesophageal reflux disease)   . Hyperlipidemia   . Hypertension   . RECTAL BLEEDING 12/09/2008   Annotation: s/p EGD 7/08 mild gastritis, s/p colonoscopy 7/08- benign polyp  s/p polypectomy and isolated diverticulum.  Qualifier: Diagnosis of  By: Ditzler RN, Debra    . Sleep apnea    CPAP    YEARS AGO DONE 1/2 YEARS AGO AND WAS TOLD DID NOT HAVE  . Tobacco abuse   . Uterine fibroid    s/p hysterectomy    Review of Systems:  Pertinent positives mentioned in HPI. Remainder of all ROS negative.   Physical Exam:  Vitals:   08/30/16 1318  BP: (!) 139/92  Pulse: 92  Temp: 98.2 F (36.8 C)  TempSrc: Oral  SpO2: 97%  Weight: 259 lb 4.8 oz (117.6 kg)   Physical Exam  Constitutional: She is oriented to person, place, and time. She appears well-developed and well-nourished. No distress.  HENT:  Mouth/Throat: Oropharynx is clear and moist.  Eyes: EOM are normal.  Neck: Neck supple. No tracheal deviation present.  Cardiovascular: Normal rate, regular rhythm and intact distal pulses.   Pulmonary/Chest: Effort normal and breath sounds normal. No respiratory distress.  Abdominal: Soft. Bowel sounds are normal. She exhibits no distension. There is no tenderness.    Musculoskeletal: Normal range of motion. She exhibits no edema.  Neurological: She is alert and oriented to person, place, and time.  Skin: Skin is warm and dry.    Assessment & Plan:   See Encounters Tab for problem based charting.  Patient discussed with Dr. Evette Doffing

## 2016-08-31 NOTE — Assessment & Plan Note (Signed)
A Currently on gabapentin 600 mg at bedtime. Patient continues to experience tingling in her toes at night.  Plan -Increased dose of gabapentin to 900 mg at bedtime

## 2016-08-31 NOTE — Assessment & Plan Note (Addendum)
A Stable. Well-controlled with Protonix. Patient is requesting a refill on this medication. She is followed by GI.  P -Refill Protonix 40 mg twice daily

## 2016-08-31 NOTE — Progress Notes (Signed)
Internal Medicine Clinic Attending  Case discussed with Dr. Rathoreat the time of the visit. We reviewed the resident's history and exam and pertinent patient test results. I agree with the assessment, diagnosis, and plan of care documented in the resident's note.  

## 2016-08-31 NOTE — Assessment & Plan Note (Signed)
Lab Results  Component Value Date   HGBA1C 8.0 08/30/2016   HGBA1C 8.5 04/19/2016   HGBA1C 8.6 01/19/2016     Assessment: Diabetes control:  above goal Progress toward A1C goal:   improved Comments: Currently on metformin 1000 mg twice daily, NovoLog 70/3080 units twice daily, Januvia 100 mg daily, and Victoza 1.8 mg daily. Meter showing average 238, no low values.   Plan: Medications:  continue current medications Home glucose monitoring: Frequency:  3 times a day Timing:  before meals Instruction/counseling given: reminded to get eye exam, reminded to bring blood glucose meter & log to each visit, reminded to bring medications to each visit, discussed foot care, discussed the need for weight loss, discussed diet and discussed sick day management Other plans:  -Referral to ophthalmology -Repeat A1c in 3 months

## 2016-09-15 ENCOUNTER — Other Ambulatory Visit (INDEPENDENT_AMBULATORY_CARE_PROVIDER_SITE_OTHER): Payer: PPO

## 2016-09-15 DIAGNOSIS — R7989 Other specified abnormal findings of blood chemistry: Secondary | ICD-10-CM

## 2016-09-15 DIAGNOSIS — R945 Abnormal results of liver function studies: Principal | ICD-10-CM

## 2016-09-15 LAB — HEPATIC FUNCTION PANEL
ALT: 34 U/L (ref 0–35)
AST: 37 U/L (ref 0–37)
Albumin: 4.1 g/dL (ref 3.5–5.2)
Alkaline Phosphatase: 67 U/L (ref 39–117)
BILIRUBIN DIRECT: 0 mg/dL (ref 0.0–0.3)
TOTAL PROTEIN: 7.7 g/dL (ref 6.0–8.3)
Total Bilirubin: 0.4 mg/dL (ref 0.2–1.2)

## 2016-09-26 ENCOUNTER — Ambulatory Visit (AMBULATORY_SURGERY_CENTER): Payer: Self-pay | Admitting: *Deleted

## 2016-09-26 VITALS — Ht 64.0 in | Wt 259.0 lb

## 2016-09-26 DIAGNOSIS — R1013 Epigastric pain: Secondary | ICD-10-CM

## 2016-09-26 DIAGNOSIS — Z86010 Personal history of colonic polyps: Secondary | ICD-10-CM

## 2016-09-26 MED ORDER — NA SULFATE-K SULFATE-MG SULF 17.5-3.13-1.6 GM/177ML PO SOLN: 1.0000 | Freq: Once | ORAL | 0 refills | Status: DC

## 2016-09-26 MED ORDER — NA SULFATE-K SULFATE-MG SULF 17.5-3.13-1.6 GM/177ML PO SOLN: 1.0000 | Freq: Once | ORAL | 0 refills | Status: AC

## 2016-09-26 NOTE — Progress Notes (Signed)
No egg or soy allergy known to patient  No issues with past sedation with any surgeries  or procedures, no intubation problems  No diet pills per patient No home 02 use per patient  No blood thinners per patient  Pt denies issues with constipation  No A fib or A flutter  emmi video to pt's e mail  2 day prep as last colon she had solid stool throughout the colon

## 2016-09-30 ENCOUNTER — Encounter: Payer: Self-pay | Admitting: Gastroenterology

## 2016-10-10 DIAGNOSIS — N281 Cyst of kidney, acquired: Secondary | ICD-10-CM | POA: Diagnosis not present

## 2016-10-12 ENCOUNTER — Other Ambulatory Visit: Payer: Self-pay

## 2016-10-12 DIAGNOSIS — M545 Low back pain: Principal | ICD-10-CM

## 2016-10-12 DIAGNOSIS — G8929 Other chronic pain: Secondary | ICD-10-CM

## 2016-10-12 NOTE — Telephone Encounter (Signed)
Requesting pain med and cough syrup to be filled. Please call pt back.

## 2016-10-14 ENCOUNTER — Encounter: Payer: Self-pay | Admitting: Gastroenterology

## 2016-10-14 ENCOUNTER — Ambulatory Visit (AMBULATORY_SURGERY_CENTER): Payer: PPO | Admitting: Gastroenterology

## 2016-10-14 VITALS — BP 113/49 | HR 82 | Temp 97.5°F | Resp 12 | Ht 64.0 in | Wt 259.0 lb

## 2016-10-14 DIAGNOSIS — K635 Polyp of colon: Secondary | ICD-10-CM

## 2016-10-14 DIAGNOSIS — Z86010 Personal history of colonic polyps: Secondary | ICD-10-CM | POA: Diagnosis not present

## 2016-10-14 DIAGNOSIS — D126 Benign neoplasm of colon, unspecified: Secondary | ICD-10-CM

## 2016-10-14 DIAGNOSIS — D124 Benign neoplasm of descending colon: Secondary | ICD-10-CM

## 2016-10-14 DIAGNOSIS — D125 Benign neoplasm of sigmoid colon: Secondary | ICD-10-CM

## 2016-10-14 DIAGNOSIS — R1013 Epigastric pain: Secondary | ICD-10-CM | POA: Diagnosis not present

## 2016-10-14 DIAGNOSIS — Z1211 Encounter for screening for malignant neoplasm of colon: Secondary | ICD-10-CM | POA: Diagnosis not present

## 2016-10-14 DIAGNOSIS — D123 Benign neoplasm of transverse colon: Secondary | ICD-10-CM

## 2016-10-14 DIAGNOSIS — Z1212 Encounter for screening for malignant neoplasm of rectum: Secondary | ICD-10-CM | POA: Diagnosis not present

## 2016-10-14 DIAGNOSIS — K219 Gastro-esophageal reflux disease without esophagitis: Secondary | ICD-10-CM | POA: Diagnosis not present

## 2016-10-14 DIAGNOSIS — K2950 Unspecified chronic gastritis without bleeding: Secondary | ICD-10-CM | POA: Diagnosis not present

## 2016-10-14 DIAGNOSIS — K2980 Duodenitis without bleeding: Secondary | ICD-10-CM

## 2016-10-14 DIAGNOSIS — D122 Benign neoplasm of ascending colon: Secondary | ICD-10-CM

## 2016-10-14 MED ORDER — OXYCODONE-ACETAMINOPHEN 5-325 MG PO TABS: 1.0000 | ORAL_TABLET | Freq: Three times a day (TID) | ORAL | 0 refills | Status: DC | PRN

## 2016-10-14 MED ORDER — SODIUM CHLORIDE 0.9 % IV SOLN: 500.0000 mL | INTRAVENOUS | Status: DC

## 2016-10-14 NOTE — Progress Notes (Signed)
To PACU VSS Report to RN 

## 2016-10-14 NOTE — Progress Notes (Signed)
Called to room to assist during endoscopic procedure.  Patient ID and intended procedure confirmed with present staff. Received instructions for my participation in the procedure from the performing physician.  

## 2016-10-14 NOTE — Op Note (Signed)
[facility] Patient Name: [name] Procedure Date: 10/14/2016 1:23 PM MRN: [id number] Endoscopist: Ladene Artist , MD Age: 56 Referring MD:  Date of Birth: [birth year] Gender: Female Account #: [id number] Procedure:                Upper GI endoscopy Indications:              Epigastric abdominal pain, Gastro-esophageal reflux                            disease Medicines:                Monitored Anesthesia Care Procedure:                Pre-Anesthesia Assessment:                           - Prior to the procedure, a History and Physical                            was performed, and patient medications and                            allergies were reviewed. The patient's tolerance of                            previous anesthesia was also reviewed. The risks                            and benefits of the procedure and the sedation                            options and risks were discussed with the patient.                            All questions were answered, and informed consent                            was obtained. Prior Anticoagulants: The patient has                            taken no previous anticoagulant or antiplatelet                            agents. ASA Grade Assessment: III - A patient with                            severe systemic disease. After reviewing the risks                            and benefits, the patient was deemed in                            satisfactory condition to undergo the procedure.  After obtaining informed consent, the endoscope was                            passed under direct vision. Throughout the                            procedure, the patient's blood pressure, pulse, and                            oxygen saturations were monitored continuously. The                            Endoscope was introduced through the mouth, and                            advanced to the second part of duodenum. The  upper                            GI endoscopy was accomplished without difficulty.                            The patient tolerated the procedure well. Scope In: Scope Out: Findings:                 The examined esophagus was normal.                           Localized mildly erythematous mucosa without                            bleeding was found in the gastric body. Biopsies                            were taken with a cold forceps for histology.                           The exam of the stomach was otherwise normal.                           Patchy mildly erythematous mucosa without active                            bleeding and with no stigmata of bleeding was found                            in the duodenal bulb.                           The second portion of the duodenum was normal. Complications:            No immediate complications. Estimated blood loss:                            Minimal. Estimated Blood Loss:     Estimated blood loss was minimal. Impression:               -  Normal esophagus.                           - Erythematous mucosa in the gastric body. Biopsied.                           - Erythematous duodenopathy.                           - Normal second portion of the duodenum. Recommendation:           - Patient has a contact number available for                            emergencies. The signs and symptoms of potential                            delayed complications were discussed with the                            patient. Return to normal activities tomorrow.                            Written discharge instructions were provided to the                            patient.                           - Resume previous diet.                           - Continue present medications.                           - Await pathology results. Ladene Artist, MD 10/14/2016 2:06:33 PM This report has been signed electronically.

## 2016-10-14 NOTE — Progress Notes (Signed)
Pt's states no medical or surgical changes since previsit or office visit. 

## 2016-10-14 NOTE — Telephone Encounter (Signed)
Left patient voicemail that prescription  is ready for pick up. Advised that PCP reports that patient does not have cough syrup on MAR to call and schedule appointment if she needs something for cough or try OTC cough syrup

## 2016-10-14 NOTE — Op Note (Signed)
[facility] Patient Name: [name] Procedure Date: 10/14/2016 1:24 PM MRN: [id number] Endoscopist: Ladene Artist , MD Age: 56 Referring MD:  Date of Birth: 05-06-1961 Gender: Female Account #: [id number] Procedure:                Colonoscopy Indications:              Screening for colorectal malignant neoplasm Medicines:                Monitored Anesthesia Care Procedure:                Pre-Anesthesia Assessment:                           - Prior to the procedure, a History and Physical                            was performed, and patient medications and                            allergies were reviewed. The patient's tolerance of                            previous anesthesia was also reviewed. The risks                            and benefits of the procedure and the sedation                            options and risks were discussed with the patient.                            All questions were answered, and informed consent                            was obtained. Prior Anticoagulants: The patient has                            taken no previous anticoagulant or antiplatelet                            agents. ASA Grade Assessment: III - A patient with                            severe systemic disease. After reviewing the risks                            and benefits, the patient was deemed in                            satisfactory condition to undergo the procedure.                           After obtaining informed consent, the colonoscope  was passed under direct vision. Throughout the                            procedure, the patient's blood pressure, pulse, and                            oxygen saturations were monitored continuously. The                            Colonoscope was introduced through the anus and                            advanced to the the cecum, identified by                            appendiceal orifice and  ileocecal valve. The                            ileocecal valve, appendiceal orifice, and rectum                            were photographed. The quality of the bowel                            preparation was good. The colonoscopy was performed                            without difficulty. The patient tolerated the                            procedure well. Scope In: 1:35:55 PM Scope Out: 1:52:32 PM Scope Withdrawal Time: 0 hours 14 minutes 35 seconds  Total Procedure Duration: 0 hours 16 minutes 37 seconds  Findings:                 The perianal and digital rectal examinations were                            normal.                           Three sessile polyps were found in the descending                            colon (1) and transverse colon (2). The polyps were                            6 to 7 mm in size. These polyps were removed with a                            cold snare. Resection and retrieval were complete                            except 1 of the transverse colon polyps was not  retrieved.                           Three sessile polyps were found in the sigmoid                            colon, transverse colon and ascending colon. The                            polyps were 4 to 5 mm in size. These polyps were                            removed with a cold biopsy forceps. Resection and                            retrieval were complete.                           Internal hemorrhoids were found during                            retroflexion. The hemorrhoids were medium-sized and                            Grade I (internal hemorrhoids that do not prolapse).                           The exam was otherwise without abnormality on                            direct and retroflexion views. Complications:            No immediate complications. Estimated blood loss:                            None. Estimated Blood Loss:     Estimated blood loss:  none. Impression:               - Three 6 to 7 mm polyps in the descending colon                            and in the transverse colon, removed with a cold                            snare. Resected and retrieved.                           - Three 4 to 5 mm polyps in the sigmoid colon, in                            the transverse colon and in the ascending colon,                            removed with a cold biopsy forceps. Resected and  retrieved.                           - Internal hemorrhoids.                           - The examination was otherwise normal on direct                            and retroflexion views. Recommendation:           - Repeat colonoscopy in 3 - 5 years for                            surveillance pending pathology review.                           - Patient has a contact number available for                            emergencies. The signs and symptoms of potential                            delayed complications were discussed with the                            patient. Return to normal activities tomorrow.                            Written discharge instructions were provided to the                            patient.                           - Resume previous diet.                           - Continue present medications.                           - Await pathology results. Ladene Artist, MD 10/14/2016 1:56:56 PM This report has been signed electronically.

## 2016-10-14 NOTE — Patient Instructions (Signed)
Impression/Recommendations:  Polyp handout given to patient. Hemorrhoid handout given to patient.  Repeat colonoscopy in 3-5 years for surveillance based on pathology review.  YOU HAD AN ENDOSCOPIC PROCEDURE TODAY AT [facility]:   Refer to the procedure report that was given to you for any specific questions about what was found during the examination.  If the procedure report does not answer your questions, please call your gastroenterologist to clarify.  If you requested that your care partner not be given the details of your procedure findings, then the procedure report has been included in a sealed envelope for you to review at your convenience later.  YOU SHOULD EXPECT: Some feelings of bloating in the abdomen. Passage of more gas than usual.  Walking can help get rid of the air that was put into your GI tract during the procedure and reduce the bloating. If you had a lower endoscopy (such as a colonoscopy or flexible sigmoidoscopy) you may notice spotting of blood in your stool or on the toilet paper. If you underwent a bowel prep for your procedure, you may not have a normal bowel movement for a few days.  Please Note:  You might notice some irritation and congestion in your nose or some drainage.  This is from the oxygen used during your procedure.  There is no need for concern and it should clear up in a day or so.  SYMPTOMS TO REPORT IMMEDIATELY:   Following lower endoscopy (colonoscopy or flexible sigmoidoscopy):  Excessive amounts of blood in the stool  Significant tenderness or worsening of abdominal pains  Swelling of the abdomen that is new, acute  Fever of 100F or higher   Following upper endoscopy (EGD)  Vomiting of blood or coffee ground material  New chest pain or pain under the shoulder blades  Painful or persistently difficult swallowing  New shortness of breath  Fever of 100F or higher  Black, tarry-looking stools  For urgent or emergent  issues, a gastroenterologist can be reached at any hour by calling [phone].   DIET:  We do recommend a small meal at first, but then you may proceed to your regular diet.  Drink plenty of fluids but you should avoid alcoholic beverages for 24 hours.  ACTIVITY:  You should plan to take it easy for the rest of today and you should NOT DRIVE or use heavy machinery until tomorrow (because of the sedation medicines used during the test).    FOLLOW UP: Our staff will call the number listed on your records the next business day following your procedure to check on you and address any questions or concerns that you may have regarding the information given to you following your procedure. If we do not reach you, we will leave a message.  However, if you are feeling well and you are not experiencing any problems, there is no need to return our call.  We will assume that you have returned to your regular daily activities without incident.  If any biopsies were taken you will be contacted by phone or by letter within the next 1-3 weeks.  Please call us at [phone] if you have not heard about the biopsies in 3 weeks.    SIGNATURES/CONFIDENTIALITY: You and/or your care partner have signed paperwork which will be entered into your electronic medical record.  These signatures attest to the fact that that the information above on your After Visit Summary has been reviewed and is understood.  Full responsibility  of the confidentiality of this discharge information lies with you and/or your care-partner.

## 2016-10-17 ENCOUNTER — Telehealth: Payer: Self-pay

## 2016-10-17 NOTE — Telephone Encounter (Signed)
  Follow up Call-  Call back number 10/14/2016  Post procedure Call Back phone  # [phone]  Permission to leave phone message Yes  Some recent data might be hidden     Left message

## 2016-10-17 NOTE — Telephone Encounter (Signed)
  Follow up Call-  Call back number 10/14/2016  Post procedure Call Back phone  # [phone]  Permission to leave phone message Yes  Some recent data might be hidden     Left message

## 2016-10-17 NOTE — Telephone Encounter (Signed)
  Follow up Call-  Call back number 10/14/2016  Post procedure Call Back phone  # [phone]  Permission to leave phone message Yes  Some recent data might be hidden     Left message

## 2016-10-18 DIAGNOSIS — E119 Type 2 diabetes mellitus without complications: Secondary | ICD-10-CM | POA: Diagnosis not present

## 2016-10-18 DIAGNOSIS — H2511 Age-related nuclear cataract, right eye: Secondary | ICD-10-CM | POA: Diagnosis not present

## 2016-10-18 DIAGNOSIS — E1159 Type 2 diabetes mellitus with other circulatory complications: Secondary | ICD-10-CM | POA: Diagnosis not present

## 2016-10-18 DIAGNOSIS — H538 Other visual disturbances: Secondary | ICD-10-CM | POA: Diagnosis not present

## 2016-10-18 LAB — HM DIABETES EYE EXAM

## 2016-10-21 ENCOUNTER — Telehealth: Payer: Self-pay | Admitting: *Deleted

## 2016-10-21 NOTE — Telephone Encounter (Signed)
Thanks

## 2016-10-21 NOTE — Telephone Encounter (Signed)
Pt called to have a refill of tessalon, according to hx, dr Marlowe Sax has not prescribed and last time was by ED md. appt made for mon 5/14 at 1545

## 2016-10-24 ENCOUNTER — Encounter: Payer: Self-pay | Admitting: Gastroenterology

## 2016-10-24 ENCOUNTER — Ambulatory Visit (INDEPENDENT_AMBULATORY_CARE_PROVIDER_SITE_OTHER): Payer: PPO | Admitting: Internal Medicine

## 2016-10-24 DIAGNOSIS — Z87891 Personal history of nicotine dependence: Secondary | ICD-10-CM | POA: Diagnosis not present

## 2016-10-24 DIAGNOSIS — J449 Chronic obstructive pulmonary disease, unspecified: Secondary | ICD-10-CM | POA: Diagnosis not present

## 2016-10-24 MED ORDER — BENZONATATE 100 MG PO CAPS: 100.0000 mg | ORAL_CAPSULE | ORAL | 0 refills | Status: DC | PRN

## 2016-10-24 MED ORDER — ALBUTEROL SULFATE HFA 108 (90 BASE) MCG/ACT IN AERS: 2.0000 | INHALATION_SPRAY | RESPIRATORY_TRACT | 5 refills | Status: DC | PRN

## 2016-10-24 NOTE — Patient Instructions (Signed)
It was a pleasure seeing you today. Thank you for choosing [facility] for your healthcare needs.  Please ensure you make an appointment to see the lung doctor.

## 2016-10-24 NOTE — Assessment & Plan Note (Signed)
Patient presents today complaining of chronic cough. She said she has had this cough for many years. It comes and goes. It acutely worsened 3 weeks ago. She says the only thing that works for her are [employer]. She says her cough is severe when she lays down to go to bed. She denies heartburn or chest pain. She denies belching. She does not think this is related to her gastroesophageal reflux disease. When trying to obtain additional history the patient was guarded and did not want to participate in the interview. She seems set on only getting her [place]. I told her I would refill this medication for a one-month supply and she will need to follow-up with pulmonology for her chronic cough as it appears she missed her last follow-up appointment. She was amenable to this solution. -- Ladona Ridgel -- Follow-up with pulmonology

## 2016-10-24 NOTE — Progress Notes (Signed)
   CC: Cough HPI: [name] is a 56 y.o. female with a h/o of asthma, chronic back pain, cocaine abuse, diabetes, GERD and hypertension who presents with Cough at night for 3 weeks.    Review of Systems: Denies CP and SOB. Denies n/v and abdominal pain. Denies heart burn.    Physical Exam: Vitals:   10/24/16 1538  BP: 129/79  Pulse: (!) 103  Temp: 98.5 F (36.9 C)  TempSrc: Oral  SpO2: 97%  Weight: 259 lb 8 oz (117.7 kg)  Height: 5\' 4"  (1.626 m)   Head: Normocephalic, without obvious abnormality, atraumatic Lungs: clear to auscultation bilaterally Heart: regular rate and rhythm, S1, S2 normal, no murmur, click, rub or gallop Abdomen: soft, non-tender; bowel sounds normal; no masses,  no organomegaly Extremities: extremities normal, atraumatic, no cyanosis or edema  Assessment & Plan:  See encounters tab for problem based medical decision making. Patient discussed with Dr. Angelia Mould  Signed: Ophelia Shoulder, MD 10/24/2016, 4:22 PM  Pager: [phone]

## 2016-10-25 NOTE — Progress Notes (Signed)
Internal Medicine Clinic Attending  Case discussed with Dr. Taylor at the time of the visit.  We reviewed the resident's history and exam and pertinent patient test results.  I agree with the assessment, diagnosis, and plan of care documented in the resident's note. 

## 2016-11-02 ENCOUNTER — Other Ambulatory Visit: Payer: Self-pay | Admitting: Internal Medicine

## 2016-11-02 ENCOUNTER — Telehealth: Payer: Self-pay

## 2016-11-02 DIAGNOSIS — E1140 Type 2 diabetes mellitus with diabetic neuropathy, unspecified: Secondary | ICD-10-CM

## 2016-11-02 DIAGNOSIS — Z794 Long term (current) use of insulin: Principal | ICD-10-CM

## 2016-11-02 NOTE — Telephone Encounter (Signed)
albuterol (PROVENTIL HFA;VENTOLIN HFA) 108 (90 Base) MCG/ACT inhaler, is not cover by the insurance. Please call pt back.

## 2016-11-02 NOTE — Telephone Encounter (Signed)
Please disregard the prior note there was a typo.  I am not the original prescriber of this medication I was just trying to refill it for her. I'm not sure who to contact about changing this. I am not sure what she should be on that her insurance would cover.

## 2016-11-02 NOTE — Telephone Encounter (Signed)
Just tried to refill this for her. I am not the prescriber of this medication. I am not sure who to contact.

## 2016-11-03 NOTE — Telephone Encounter (Signed)
Fax from [facility] - requesting Proventil be change to ProAir. Please send new rx. Thanks

## 2016-11-04 MED ORDER — ALBUTEROL SULFATE HFA 108 (90 BASE) MCG/ACT IN AERS: 2.0000 | INHALATION_SPRAY | RESPIRATORY_TRACT | 2 refills | Status: DC | PRN

## 2016-11-08 NOTE — Telephone Encounter (Signed)
Done 5/25 per Dr Marlowe Sax.

## 2016-11-29 ENCOUNTER — Encounter: Payer: Self-pay | Admitting: Internal Medicine

## 2016-11-29 ENCOUNTER — Ambulatory Visit (INDEPENDENT_AMBULATORY_CARE_PROVIDER_SITE_OTHER): Payer: PPO | Admitting: Internal Medicine

## 2016-11-29 VITALS — BP 155/74 | HR 97 | Temp 98.0°F | Wt 260.8 lb

## 2016-11-29 DIAGNOSIS — E785 Hyperlipidemia, unspecified: Secondary | ICD-10-CM

## 2016-11-29 DIAGNOSIS — G44209 Tension-type headache, unspecified, not intractable: Secondary | ICD-10-CM

## 2016-11-29 DIAGNOSIS — I10 Essential (primary) hypertension: Secondary | ICD-10-CM

## 2016-11-29 DIAGNOSIS — Z79891 Long term (current) use of opiate analgesic: Secondary | ICD-10-CM | POA: Diagnosis not present

## 2016-11-29 DIAGNOSIS — Z794 Long term (current) use of insulin: Secondary | ICD-10-CM | POA: Diagnosis not present

## 2016-11-29 DIAGNOSIS — Z87891 Personal history of nicotine dependence: Secondary | ICD-10-CM | POA: Diagnosis not present

## 2016-11-29 DIAGNOSIS — K219 Gastro-esophageal reflux disease without esophagitis: Secondary | ICD-10-CM

## 2016-11-29 DIAGNOSIS — E1140 Type 2 diabetes mellitus with diabetic neuropathy, unspecified: Secondary | ICD-10-CM | POA: Diagnosis not present

## 2016-11-29 DIAGNOSIS — M545 Low back pain: Secondary | ICD-10-CM | POA: Diagnosis not present

## 2016-11-29 DIAGNOSIS — G8929 Other chronic pain: Secondary | ICD-10-CM

## 2016-11-29 DIAGNOSIS — Z79899 Other long term (current) drug therapy: Secondary | ICD-10-CM | POA: Diagnosis not present

## 2016-11-29 LAB — POCT GLYCOSYLATED HEMOGLOBIN (HGB A1C): Hemoglobin A1C: 8.4

## 2016-11-29 LAB — GLUCOSE, CAPILLARY: GLUCOSE-CAPILLARY: 163 mg/dL — AB (ref 65–99)

## 2016-11-29 MED ORDER — METFORMIN HCL 1000 MG PO TABS: 1000.0000 mg | ORAL_TABLET | Freq: Two times a day (BID) | ORAL | 3 refills | Status: DC

## 2016-11-29 MED ORDER — ACETAMINOPHEN 500 MG PO TABS: 1000.0000 mg | ORAL_TABLET | Freq: Three times a day (TID) | ORAL | 0 refills | Status: AC | PRN

## 2016-11-29 MED ORDER — HYDROCHLOROTHIAZIDE 25 MG PO TABS: 25.0000 mg | ORAL_TABLET | Freq: Every day | ORAL | 0 refills | Status: DC

## 2016-11-29 MED ORDER — LIRAGLUTIDE 18 MG/3ML ~~LOC~~ SOPN: 1.8000 mg | PEN_INJECTOR | Freq: Every day | SUBCUTANEOUS | 5 refills | Status: DC

## 2016-11-29 MED ORDER — ROSUVASTATIN CALCIUM 20 MG PO TABS: 20.0000 mg | ORAL_TABLET | Freq: Every day | ORAL | 0 refills | Status: DC

## 2016-11-29 MED ORDER — OXYCODONE-ACETAMINOPHEN 5-325 MG PO TABS: 1.0000 | ORAL_TABLET | Freq: Three times a day (TID) | ORAL | 0 refills | Status: DC | PRN

## 2016-11-29 MED ORDER — PANTOPRAZOLE SODIUM 40 MG PO TBEC: 40.0000 mg | DELAYED_RELEASE_TABLET | Freq: Two times a day (BID) | ORAL | 0 refills | Status: DC

## 2016-11-29 NOTE — Patient Instructions (Signed)
[name] it was nice seeing you today.  -Continue taking your blood pressure and diabetes medications as before  -I have referred you to Aizlynn. She will educate you more about Vgo insulin.   -Return to the clinic in 4 weeks.

## 2016-11-30 LAB — MICROALBUMIN / CREATININE URINE RATIO
Creatinine, Urine: 49 mg/dL
Microalb/Creat Ratio: <6.1 mg/g creat (ref 0.0–30.0)
Microalbumin, Urine: <3 ug/mL

## 2016-11-30 NOTE — Assessment & Plan Note (Addendum)
Lab Results  Component Value Date   HGBA1C 8.4 11/29/2016   HGBA1C 8.0 08/30/2016   HGBA1C 8.5 04/19/2016     Assessment: Diabetes control:  above goal Comments: Current medication regimen includes metformin 1000 mg twice daily, Victoza 1.8 mg daily, Januvia 100 mg daily, and NovoLog 70/30 80 mg twice daily. Patient is interested in stopping NovoLog 70/30 as it is not helping control her blood sugars. Review of meter showing average 176 and most readings above target. Patient reports having one episode of hypoglycemic symptoms a week ago when she skipped a meal but reports doing well otherwise. I explained to her that if I change her regimen to Lantus, she will also need preprandial insulin. Patient stated that would be challenging as she goes to work. Discussed the option of Vgo and she seems interested. Advised her to return to the clinic to meet for Kaysha and learn more about this option. Patient agreed.  Plan: Medications:  continue current medications Home glucose monitoring: Frequency:   Timing:   Instruction/counseling given: reminded to get eye exam, reminded to bring blood glucose meter & log to each visit, reminded to bring medications to each visit, discussed foot care, discussed the need for weight loss, discussed diet and discussed sick day management Other plans: Referral to [facility]. Return to the clinic in 4 weeks.  Addendum 11/30/2016: Urine microalbumin to creatinine ratio checked at this visit normal.  Addendum 12/09/2016: Tried calling the patient again today and was able to reach her over the phone. Discussed lab results with her. Continue current management.

## 2016-11-30 NOTE — Assessment & Plan Note (Signed)
Stable. No new complaints. Protonix refilled at this visit per patient request.

## 2016-11-30 NOTE — Progress Notes (Signed)
    CC: Patient is complaining of a headache and requesting a refill on her pain medication for her chronic back pain. Hypertension and diabetes were also discussed during this visit.  HPI:  [name] is a 56 y.o. female with a past medical history of conditions listed below presenting to the clinic complaining of a headache and requesting a refill on her pain medication for her chronic back pain. Hypertension and diabetes were also discussed during this visit. Please see problem based charting for the status of the patient's current and chronic medical conditions.   Past Medical History:  Diagnosis Date  . Allergy   . Arthritis    back   . Asthma    AS CHILD  . Chronic back pain   . Chronic leg pain    due to back pain  . Cocaine abuse    in remission  . 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 GERD (gastroesophageal reflux disease)   . Hyperlipidemia   . Hypertension   . Neuromuscular disorder ([facility])    neuropathy  . RECTAL BLEEDING 12/09/2008   Annotation: s/p EGD 7/08 mild gastritis, s/p colonoscopy 7/08- benign polyp  s/p polypectomy and isolated diverticulum.  Qualifier: Diagnosis of  By: Ditzler RN, Debra    . Sleep apnea    CPAP    YEARS AGO DONE 1/2 YEARS AGO AND WAS TOLD DID NOT HAVE  . Tobacco abuse   . Uterine fibroid    s/p hysterectomy    Review of Systems: Pertinent positives mentioned in HPI. Remainder of all ROS negative.   Physical Exam:  Vitals:   11/29/16 1532  BP: (!) 155/74  Pulse: 97  Temp: 98 F (36.7 C)  TempSrc: Oral  SpO2: 98%  Weight: 260 lb 12.8 oz (118.3 kg)   Physical Exam  Constitutional: She is oriented to person, place, and time. She appears well-developed and well-nourished. No distress.  HENT:  Head: Normocephalic and atraumatic.  Eyes: EOM are normal.  Cardiovascular: Normal rate, regular rhythm and intact distal pulses.   Pulmonary/Chest: Effort normal and breath sounds normal. No respiratory distress. She has no wheezes. She  has no rales.  Abdominal: Soft. Bowel sounds are normal. She exhibits no distension. There is no tenderness.  Musculoskeletal: She exhibits no edema.  Neurological: She is alert and oriented to person, place, and time. No cranial nerve deficit.  Strength 5 out of 5 in sensation to light touch intact in bilateral upper and lower extremities.  Skin: Skin is warm and dry.    Assessment & Plan:   See Encounters Tab for problem based charting.  Patient discussed with Dr. Lynnae January

## 2016-11-30 NOTE — Progress Notes (Signed)
Internal Medicine Clinic Attending  Case discussed with Dr. Rathoreat the time of the visit. We reviewed the resident's history and exam and pertinent patient test results. I agree with the assessment, diagnosis, and plan of care documented in the resident's note.  

## 2016-11-30 NOTE — Assessment & Plan Note (Signed)
History of present illness Patient reports having intermittent bilateral frontal and occipital headaches which are 10 out of 10 in intensity for the past 1 week. She describes the pain as throbbing in nature. Denies having any changes in her vision, nausea, vomiting, or any focal weakness. States the muscles at the back of her neck feel tight and it feels like something is pulling at the back of her head. Tylenol has been helping with her headaches.  Assessment Tension headaches. No red flags in history or focal neurological deficits on exam.  Plan -Tylenol 1000 mg every 8 hours as needed -Neck muscle stretching exercises -Heating pad as needed

## 2016-11-30 NOTE — Assessment & Plan Note (Signed)
BP Readings from Last 3 Encounters:  11/29/16 (!) 155/74  10/24/16 129/79  10/14/16 (!) 113/49    Lab Results  Component Value Date   NA 143 07/27/2016   K 3.6 07/27/2016   CREATININE 0.74 07/27/2016    Assessment: Blood pressure control:  above goal Comments: Her blood pressure has been previously well controlled. At this visit, blood pressure 155/74. Current medication regimen includes hydrochlorothiazide 25 mg daily.  Plan: Medications:  continue current medications Educational resources provided:   Educated patient about healthy eating and exercise. Emphasized the importance of weight loss.  Other plans: Return to the clinic in 4 weeks for blood pressure recheck.

## 2016-11-30 NOTE — Assessment & Plan Note (Signed)
Crestor refilled at this visit per patient request.

## 2016-11-30 NOTE — Assessment & Plan Note (Signed)
Assessment Patient is requesting a refill on Norco for her chronic lower back pain. Review of [place] controlled substances database does not reveal any red flags. She was last dispensed Percocet 5-325 mg one tablet every 8 hours as needed #50 on 10/15/2016. Last UDS in October 2017 was appropriate.  Plan -Refill Percocet 5-325 mg 1 tablet every 8 hours as needed #50

## 2016-12-06 ENCOUNTER — Telehealth: Payer: Self-pay

## 2016-12-06 NOTE — Telephone Encounter (Signed)
Requesting lab results. Please call back.  

## 2016-12-08 NOTE — Telephone Encounter (Signed)
Tried calling the patient could not reach her. Family member picked up the phone. Requested her to ask patient to call the clinic back.

## 2016-12-09 NOTE — Telephone Encounter (Signed)
Spoke to the patient over the phone. Thanks.

## 2016-12-09 NOTE — Telephone Encounter (Signed)
Pt stated no call her ; I will send another message to Dr Marlowe Sax.

## 2016-12-23 ENCOUNTER — Ambulatory Visit (INDEPENDENT_AMBULATORY_CARE_PROVIDER_SITE_OTHER): Payer: PPO | Admitting: Dietician

## 2016-12-23 ENCOUNTER — Other Ambulatory Visit: Payer: Self-pay | Admitting: Dietician

## 2016-12-23 DIAGNOSIS — Z713 Dietary counseling and surveillance: Secondary | ICD-10-CM

## 2016-12-23 DIAGNOSIS — E1140 Type 2 diabetes mellitus with diabetic neuropathy, unspecified: Secondary | ICD-10-CM

## 2016-12-23 DIAGNOSIS — R05 Cough: Secondary | ICD-10-CM | POA: Diagnosis not present

## 2016-12-23 DIAGNOSIS — Z794 Long term (current) use of insulin: Secondary | ICD-10-CM

## 2016-12-23 DIAGNOSIS — R1313 Dysphagia, pharyngeal phase: Secondary | ICD-10-CM | POA: Diagnosis not present

## 2016-12-23 MED ORDER — V-GO 40 KIT
PACK | 6 refills | Status: DC
Start: 2016-12-23 — End: 2016-12-27

## 2016-12-23 MED ORDER — INSULIN ASPART 100 UNIT/ML ~~LOC~~ SOLN: SUBCUTANEOUS | 5 refills | Status: DC

## 2016-12-23 NOTE — Telephone Encounter (Signed)
Patient was trained on Vgo today. She request prescriptions to be sent to pharmacy.

## 2016-12-23 NOTE — Patient Instructions (Addendum)
Ebonye you did great filling and placing your new VGO!  Don't forget if you cannot get through to our office to call the VGO support line at [phone]   [facility] and apply a new V-Go every 24 hours.  Give 4-6 button presses before each meal and 2 with snacks Breakfast- start with 4 clicks but can increase to 5 if needed Lunch- 5 clicks Dinner- 6 clicks Snack- 2 clicks  Test blood sugars before each meal and at bedtime.  Return in 2-3 weeks or sooner if needed.

## 2016-12-23 NOTE — Progress Notes (Signed)
Diabetes Self-Management Education  Visit Type: Follow-up  Appt. Start Time: 925 Appt. End Time: 1100  12/23/2016  [name], identified by name and date of birth, is a 56 y.o. female with a diagnosis of Diabetes:  .   ASSESSMENT  Asked to educate [name] about the [place]. She wanted to start using the VGo today. I spoke to Dr. Heber Ferndale who gave approval and instructions for the VGO40 daily with 4-6 clicks for meal snacks 2 for snacks.  She was trained and then repeated the demonstration by filling 3 Vgos herself and placing her first VGo since the last insulin she took was yesterday and her blood sugar was 294 this morning after she ate a banana. Sh was given a sample of 9 vgo40s and a vial of Novolog. She'll need a prescription for VGO 40 and 3 vials Novolog per month to fill her VGos. She understands not to take her Novolog Mox 70/30 anymore. She also understands this may not be enough insulin for her and that she may need to use amore concentrated insulin to get better blood sugar control.   Height 5' 2.75" (1.594 m), weight 257 lb 14.4 oz (117 kg). Body mass index is 46.05 kg/m.      Diabetes Self-Management Education - 12/23/16 1100      Visit Information   Visit Type Follow-up     Health Coping   How would you rate your overall health? Good     Psychosocial Assessment   Patient Belief/Attitude about Diabetes (P)  Motivated to manage diabetes   Self-care barriers (P)  Other (comment)   Self-management support (P)  Doctor's office;Friends;CDE visits   Patient Concerns (P)  Medication;Glycemic Control   Special Needs (P)  None   Preferred Learning Style (P)  Hands on   Learning Readiness (P)  Ready   How often do you need to have someone help you when you read instructions, pamphlets, or other written materials from your doctor or pharmacy? (P)  2 - Rarely     Pre-Education Assessment   Patient understands incorporating nutritional management into lifestyle. (P)   Demonstrates understanding / competency   Patient undertands incorporating physical activity into lifestyle. (P)  Demonstrates understanding / competency   Patient understands using medications safely. (P)  Needs Instruction   Patient understands monitoring blood glucose, interpreting and using results (P)  Needs Review     Complications   Last HgB A1C per patient/outside source (P)  --  8.4   How often do you check your blood sugar? (P)  1-2 times/day   Fasting Blood glucose range (mg/dL) (P)  130-179;180-200   Postprandial Blood glucose range (mg/dL) (P)  >200   Number of hypoglycemic episodes per month (P)  0   Number of hyperglycemic episodes per week (P)  10   Can you tell when your blood sugar is high? (P)  Yes   What do you do if your blood sugar is high? (P)  --  take medicine and drink water   Have you had a dilated eye exam in the past 12 months? (P)  Yes   Have you had a dental exam in the past 12 months? (P)  Yes   Are you checking your feet? (P)  Yes     Subsequent Visit   Since your last visit have you continued or begun to take your medications as prescribed? Yes   Since your last visit have you had your blood pressure checked? Yes  Is your most recent blood pressure lower, unchanged, or higher since your last visit? (P)  Unchanged   Since your last visit have you experienced any weight changes? (P)  No change   Since your last visit, are you checking your blood glucose at least once a day? (P)  Yes      Individualized Plan for Diabetes Self-Management Training:   Learning Objective:  Patient will have a greater understanding of diabetes self-management. Patient education plan is to attend individual and/or group sessions per assessed needs and concerns.   Plan:   Patient Instructions  Fill and apply a new V-Go every 24 hours.  Give 4-6 button presses before each meal and 2 with snacks Breakfast- start with 4 clicks but can increase to 5 if needed Lunch- 5  clicks Dinner- 6 clicks Snack- 2 clicks  Test blood sugars before each meal and at bedtime.  Return in 2-3 weeks or sooner if needed.   Expected Outcomes:     Education material provided: Support group flyer  If problems or questions, patient to contact team via:  Phone  Future DSME appointment:   2-3 weeks with CDE Terance Pomplun, Butch Penny, [place] 12/23/2016 11:31 AM.

## 2016-12-26 ENCOUNTER — Other Ambulatory Visit: Payer: Self-pay | Admitting: Internal Medicine

## 2016-12-27 ENCOUNTER — Ambulatory Visit (INDEPENDENT_AMBULATORY_CARE_PROVIDER_SITE_OTHER): Payer: PPO | Admitting: Internal Medicine

## 2016-12-27 ENCOUNTER — Encounter: Payer: Self-pay | Admitting: Internal Medicine

## 2016-12-27 VITALS — BP 138/78 | HR 97 | Temp 98.1°F | Wt 260.0 lb

## 2016-12-27 DIAGNOSIS — Z794 Long term (current) use of insulin: Secondary | ICD-10-CM | POA: Diagnosis not present

## 2016-12-27 DIAGNOSIS — Z8051 Family history of malignant neoplasm of kidney: Secondary | ICD-10-CM | POA: Diagnosis not present

## 2016-12-27 DIAGNOSIS — E1140 Type 2 diabetes mellitus with diabetic neuropathy, unspecified: Secondary | ICD-10-CM

## 2016-12-27 DIAGNOSIS — Z841 Family history of disorders of kidney and ureter: Secondary | ICD-10-CM | POA: Diagnosis not present

## 2016-12-27 DIAGNOSIS — I10 Essential (primary) hypertension: Secondary | ICD-10-CM | POA: Diagnosis not present

## 2016-12-27 DIAGNOSIS — Z8249 Family history of ischemic heart disease and other diseases of the circulatory system: Secondary | ICD-10-CM | POA: Diagnosis not present

## 2016-12-27 DIAGNOSIS — Z833 Family history of diabetes mellitus: Secondary | ICD-10-CM | POA: Diagnosis not present

## 2016-12-27 DIAGNOSIS — Z87891 Personal history of nicotine dependence: Secondary | ICD-10-CM | POA: Diagnosis not present

## 2016-12-27 DIAGNOSIS — E1121 Type 2 diabetes mellitus with diabetic nephropathy: Secondary | ICD-10-CM | POA: Diagnosis not present

## 2016-12-27 MED ORDER — INSULIN ASPART PROT & ASPART (70-30 MIX) 100 UNIT/ML ~~LOC~~ SUSP: SUBCUTANEOUS | 0 refills | Status: DC

## 2016-12-27 NOTE — Patient Instructions (Addendum)
[name] it was nice seeing you today.  -STOP using Vgo  -Go back to using Novolog 70/30: Inject 85 units twice daily  The medicine you have left at home should last you another 3 weeks. Do not refill it after that.   -Continue using Metformin, Januvia, and Victoza   -Return to the clinic in exactly 3 weeks.

## 2016-12-28 NOTE — Assessment & Plan Note (Signed)
BP Readings from Last 3 Encounters:  12/27/16 138/78  11/29/16 (!) 155/74  10/24/16 129/79    Lab Results  Component Value Date   NA 143 07/27/2016   K 3.6 07/27/2016   CREATININE 0.74 07/27/2016    Assessment: Blood pressure control:  well-controlled Progress toward BP goal:   improved Comments: Her medication regimen includes hydrochlorothiazide 25 mg daily.  Plan: Medications:  continue current medications Educational resources provided:   Educated patient about healthy eating and exercise. Emphasized the importance of weight loss.

## 2016-12-28 NOTE — Progress Notes (Signed)
   CC: Patient is here for a regular follow-up. Hypertension and diabetes were discussed during this visit.  HPI:  [name] is a 56 y.o. female with a past medical history of conditions listed below presenting to the clinic for a regular follow-up. Hypertension and diabetes were discussed during this visit. Please see problem based charting for the status of the patient's current and chronic medical conditions.   Past Medical History:  Diagnosis Date  . Allergy   . Arthritis    back   . Asthma    AS CHILD  . Chronic back pain   . Chronic leg pain    due to back pain  . Cocaine abuse    in remission  . Diabetes ([place])    with neuropathy  . GERD (gastroesophageal reflux disease)   . Hyperlipidemia   . Hypertension   . Neuromuscular disorder ([facility])    neuropathy  . RECTAL BLEEDING 12/09/2008   Annotation: s/p EGD 7/08 mild gastritis, s/p colonoscopy 7/08- benign polyp  s/p polypectomy and isolated diverticulum.  Qualifier: Diagnosis of  By: Ditzler RN, Debra    . Sleep apnea    CPAP    YEARS AGO DONE 1/2 YEARS AGO AND WAS TOLD DID NOT HAVE  . Tobacco abuse   . Uterine fibroid    s/p hysterectomy   Review of Systems: Pertinent positives mentioned in HPI. Remainder of all ROS negative.   Physical Exam:  Vitals:   12/27/16 1357  BP: 138/78  Pulse: 97  Temp: 98.1 F (36.7 C)  TempSrc: Oral  SpO2: 98%  Weight: 260 lb (117.9 kg)   Physical Exam  Constitutional: She is oriented to person, place, and time. She appears well-developed and well-nourished. No distress.  HENT:  Head: Normocephalic and atraumatic.  Eyes: Right eye exhibits no discharge. Left eye exhibits no discharge.  Cardiovascular: Normal rate, regular rhythm and intact distal pulses.   Pulmonary/Chest: Effort normal and breath sounds normal. No respiratory distress. She has no wheezes. She has no rales.  Abdominal: Soft. Bowel sounds are normal. She exhibits no distension. There is no tenderness.    Musculoskeletal: She exhibits no edema.  Neurological: She is alert and oriented to person, place, and time.  Skin: Skin is warm and dry.    Assessment & Plan:   See Encounters Tab for problem based charting.  Patient discussed with Dr. Lynnae January

## 2016-12-28 NOTE — Assessment & Plan Note (Signed)
Lab Results  Component Value Date   HGBA1C 8.4 11/29/2016   HGBA1C 8.0 08/30/2016   HGBA1C 8.5 04/19/2016     Assessment: Diabetes control:  above goal Comments: Prior medication regimen included metformin 1000 mg twice daily, Victoza 1.8 mg daily, Januvia 100 mg daily, and NovoLog 70/30 80u twice daily. Patient was recently seen by Butch Penny and given a sample of the VGo-40. She was advised to stop using NovoLog 70/30. Patient states she started using VGo 4 days ago and does not seem to like it as her CBGs have been in the 300s at home despite using a total of 40 units of short acting insulin with the VGo along with NovoLog 70/30 55 units the morning and 55 units at night. She is interested in starting Lantus instead. I explained to the patient that if she was started on Lantus, she would also need preprandial coverage which could be either in the form of a pen or VGo depending on her preference. She told me she had 11 boxes of NovoLog 70/30 left at home and would like to use them before starting Lantus.   Plan: Medications:  Plan is for the patient to stop using VGo and instead start NovoLog 70/30 85 units twice daily; meaning she would run out of the pens she has at home in the next 3 weeks. Advised her to continue using metformin, Victoza, and Januvia as above. She has been advised to return to the clinic in exactly 3 weeks (before she runs out of insulin) at which time NovoLog 70/30 will be discontinued and she will be started on Lantus plus preprandial insulin either in the form of VGo or a pen. Home glucose monitoring: Frequency:  3 times a day Timing:  before meals Instruction/counseling given: reminded to get eye exam, reminded to bring blood glucose meter & log to each visit, reminded to bring medications to each visit, discussed foot care, discussed the need for weight loss, discussed diet and discussed sick day management

## 2016-12-29 NOTE — Progress Notes (Signed)
Internal Medicine Clinic Attending  Case discussed with Dr. Rathoreat the time of the visit. We reviewed the resident's history and exam and pertinent patient test results. I agree with the assessment, diagnosis, and plan of care documented in the resident's note.  

## 2017-01-06 ENCOUNTER — Ambulatory Visit (INDEPENDENT_AMBULATORY_CARE_PROVIDER_SITE_OTHER): Payer: PPO | Admitting: Internal Medicine

## 2017-01-06 ENCOUNTER — Encounter: Payer: Self-pay | Admitting: Internal Medicine

## 2017-01-06 VITALS — BP 146/82 | HR 97 | Ht 62.0 in | Wt 257.0 lb

## 2017-01-06 DIAGNOSIS — J387 Other diseases of larynx: Secondary | ICD-10-CM | POA: Diagnosis not present

## 2017-01-06 DIAGNOSIS — R053 Chronic cough: Secondary | ICD-10-CM

## 2017-01-06 DIAGNOSIS — R05 Cough: Secondary | ICD-10-CM

## 2017-01-06 DIAGNOSIS — R911 Solitary pulmonary nodule: Secondary | ICD-10-CM

## 2017-01-06 DIAGNOSIS — IMO0001 Reserved for inherently not codable concepts without codable children: Secondary | ICD-10-CM

## 2017-01-06 LAB — NITRIC OXIDE: NITRIC OXIDE: 15

## 2017-01-06 NOTE — Progress Notes (Signed)
Subjective:     Patient ID: [name], female   DOB: 1961/03/19, 56 y.o.   MRN: [id number]  HPI   IOV 02/25/2013  56 year old nonsmoker obese female. Accompanied by her husband. Chief complaint is chronic cough.  Cough is of insidious onset a year ago. Was severe in intensity. And was progressive. Quality was a dry cough. There is associated ticklish sensation in her throat and constant clearing and gagging. In April 2014 was diagnosed to have esophageal dysmotility and in July 2014 saw [name] who adjusted her proton pump inhibitor and change her diet. After this in the last several weeks her cough significantly improved and almost resolved. In between he esophagogram in seeing gastroenterologist [name] she did see [name] of ENT specialty who apparently did not see any abnormal with her vocal cords but treated her for sinusitis and this did not help her cough.   Cough relevant history   - Sinus/allergies  - Denies any problems. Never on nasal steroids. But does have some postnasal drip  - GI/reflux disease  - July 2008 had endoscopy which showed mild gastritis. She is on proton pump inhibitor  - Body mass index is 42.99 kg/(m^2).  - esophagogram Aug 2014: IMPRESSION:  Mild esophageal dysmotility.  Otherwise negative esophagram.  Original Report Authenticated By: [name], M.D.   - SAw DR Collene [name] July 2014 and PPI adjusted and Diet changed-> after this cough resolved   - Hypertension  -  She has hypertension but she is not on ACE inhibitor  - Pulmonary history  -  reports that she quit smoking about 14 years ago. Her smoking use included Cigarettes. She has a 10 pack-year smoking history. She has never used smokeless tobacco.   - CT chest 2006    -   Findings: No dissection is evident. No filling defect is identified in the pulmonary arterial tree to suggest pulmonary embolus. In the lateral basal segment of the right lower lobe there is some atelectasis  adjacent to a small region of nodularity measuring 6 mm in diameter. This nodularity likely simply relates to atelectasis, but a true pulmonary nodule is difficult to exclude. I would recommend follow up limited noncontrast CT in 3 months time in order to reassess this region.  There is no hilar or mediastinal adenopathy.  IMPRESSION:  No embolus. Mild nodularity associated with right lower lobe subsegmental atx; recommend followup limited evaluation in 3 months in order to ensure that this clears or fails to progress. The main purpose of the followup is to rule out the statistically unlikely possibility that this represents early malignancy.    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  #Sinus  - continue take generic fluticasone inhaler 2 squirts each nostril daily - START 3% nasal saline spray at night 2 squietrs made by a company called [name] [employer]  #ACid reflux  - most important reason for your cough  - folllow advice of Dr Collene [name] 100% without fail  #Irritable larynx  -2- 3 days of complete voice rest without whispering or talking - See MR [name] of neuro rehab for speech therapy  #followup  - 4-6 weeks with me or my CMA [name] with cough score at followupo - If cough still a problem, will consider CT chest or neck and methacholine challenge test and Rx with neurontin   04/23/2013  Follow up Cough  6 week follow up - reports cough improves for a few days, then worsens again.Marland Kitchen Cough seems to wax and wane .  Not using anything to control cough .  Has sinus drip and drainage esp at night.  No fever, discolored mucus, chest pain, orthopnea or edema.  CXR 01/2013 with no acute findings.  [name] Cough score 23 today   Begin Delsym 2 tsp Twice daily   Begin Tessalon 240m Three times a day   Begin Chlortrimeton 451m1 in am and 2 At bedtime  -may make you sleepy.  Continue on  Protonix daily before meal  Continue on Zantac At bedtime   Work on not coughing or throat clearing.  Please contact office for sooner follow up if symptoms do not improve or worsen or seek emergency care  follow up Dr. RaChase [name] 4-6 weeks and As needed  OV 05/21/2013 Chief Complaint  Patient presents with  . Cough    follow-up. Pt states cough is not improved at all.   [age] female reports for followup of cough that is chronic. Since seeing me in September 2014 on the cough score her cough appears one third better but subjectively she says that her cough is unimproved. She reports compliance with a sinus treatment measures and acid reflux treatment measures. Despite attending speech therapy and following my advice and the advice of nurse practitioner a month ago cough is unimproved. She is frustrated. RSI cough score is 25 and details are below. She is open to more advanced testing    05/31/2013 Follow up  Returns for follow up and review test results . Seen last week with cough . No significant change in cough.   Patient underwent a pulmonary function test on December 12 that showed moderate airflow obstruction with FEV1 is 1.35 L/59%. Ratio 62. No sign change BD   Patient underwent CT chest on December 9 that showed mild bronchiectasis in the right middle lobe. No evidence of interstitial lung disease. There was an incidental nodule in the right lung base, measuring 7 mm.  CT sinus showed no acute sinus disease  She denies any hemoptysis, orthopnea, PND, or leg swelling.  REC Trial of Breo Inhaler 1 puff daily , brush/rinse and gargle after use.  Continue on Delsym 2 tsp Twice daily  For cough As needed   Continue on Tessalon 200m30mree times a day  As needed  Cough .  Continue use Chlortrimeton 4mg 69mn am and 2 At bedtime  -may make you sleepy.  Continue on Protonix daily before meal  Continue on Zantac At bedtime   Work on not coughing or throat clearing- use sips of  water, sugarless candy. NO mints .  Please contact office for sooner follow up if symptoms do not improve or worsen or seek emergency care  follow up Dr. RamasChase [name]-6 weeks and As needed   OV 07/26/2013  Chief Complaint  Patient presents with  . Cough    follow-up. Pt states cough is same as last visti. no better.    Followup chronic cough  She says she is no better. For sinus she's on nasal spray, Flonase, last visit started on Chlor-Trimeton by my nurse practitioner. For acid reflux she is on Protonix and Zantac. 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 RSI cough score is 21. 4 obstructive lung disease she is on Brio but this also has not helped. She's willing to try  Neurontin she's been to speech therapy in the past. Neurontin made her gain weight and one of her chronic pain medications and she stopped it but has not had any adverse effects. I explained to her that Neurontin can be helpful and medications with irritable larynx or chronic cough or cyclical cough but can cause significant side effects of grogginess, fogginess, sleepiness and weight gain. She understands the side effects and is willing to give her short term trial of this  REportes good compliance but I doubt   REC Continue sinus, gerd, and inhaler therpay  - change breo to symbicort 2 puff bid  Take gabapentin 360m once daily x 5 days, then 3072mtwice daily x 5 days, then 30078mhree times daily to continue. If this makes you too sleepy or drowsy call us [facility] we will cut your medication dosing down   Return in 4 - 8 weeks   - cough score at followup   OV 09/06/2013  Chief Complaint  Patient presents with  . Cough    Reports cough has improved since last OV--Has days it will try and come back     FU chronic cough:    She tells me she is "some" better but in reality the RSI cough score shows persistence of severe chronic cough. In terms of sinus control: She is only doing  her nasal saline drops as needed. She's not doing nasal steroids and she is unclear why. In terms of obstructive lung disease: She says that she is compliant with his Symbicort although she did not know the name initially. In terms of acid reflux: She says she's compliant with the proton pump and better. In terms of irritable larynx: I advised her to start gabapentin at last visit but she does be that she showed up at the pharmacy and the pharmacist told her that in his years of experience he never saw gabapentin being prescribed for chronic cough so she decided not to treat herself with gabapentin. I asked her why she can call here and she had no answer.  I've advised her 100% compliance with treatment measures if she needs to improve. Because of her on the side effects of gabapentin extensively and after listening to this and the potential benefits based on lancet paper she is willing to give this a try   COugh  - not improved - important you follow instructions carefully, closely and 100% of the time   -  Sinus   -  take generic fluticasone inhaler 2 squirts each nostril daily  - continue nasal saline spray - GERD  : continue medications for this  - Airflow obstruction  :  symbicort 2 puff twice daily  - Irritable larynx  : START e gabapentin 300m55mce daily x 5 days, then 300mg7mce daily x 5 days, then 300mg 16me times daily to continue. If this makes you too sleepy or drowsy call us andK[place]ae will cut your medication dosing down   Return in 4 - 8 weeks   - cough score at followup  OV .10/16/2013  Chief Complaint  Patient presents with  . Cough    follow up.  cough is 80% improved since last, but feels the Gabapentin has increased her appetite.   Followup chronic cough. At last visit after extensive conversation about compliance and the need to start gabapentin she did start gabapentin and she's been compliant with sinus, acid reflux and Symbicort inhaler. With these measures the  cough is 80% better.  Objectively as well RSI cough score is reduced at 13. However, she is reporting increased weight gain by 4 pounds and also increased appetite since starting gabapentin. Review of gabapentin side effects reveal that the incidence of this is between 1 and 3% only but she is convinced that the drug is  to blame. She does admit that gabapentin has helped her cough. Therefore she is requesting for a taper in the gabapentin dose as a trial.  Otherwise no problems. Of note, she has a 7 mm lung nodule in December 2014 and neck CT scan of the chest i needs to be done in summer 2015  REC COugh  - much better after starting gabapentin but you are having side effects. So, will make a revised plan in BOLD   -  Sinus   -  continue generic fluticasone inhaler 2 squirts each nostril daily  - continue nasal saline spray - GERD  : continue medications for this  - Airflow obstruction  Continue symbicort 2 puff twice daily  - Irritable larynx  : Continue gabapentin but reduce to 357m once a NIGHT at bedtime (instea of 3  times a day)    Return  - in July/Augt 2015  - cough part 1 score at followup  - CT scan chest for lung nodule to be done prior to followup  OV 02/13/2014  Chief Complaint  Patient presents with  . Follow-up    Pt here to review CT results. Pt states her breathing is doing well. Pt states she only gets SOB when climbing steps. Pt c/o mild cough with intermittent mucous production in morning, brown in color. Pt denies CP.     Followup chronic cough.  And lung nodule  Chronic cough: Now on gabapentin low dose qhs, ompliant with sinus, acid reflux and Symbicort inhaler. With these measures the cough is better. Objectively as well RSI cough score is reduced at 13 but same as before. Ideally would like to get rid of it but accepts this is best possible.   OBesity: Body mass index is 44.61 kg/(m^2). CT July 2015 has fatty  Liver and I warned about rfuture cirrhosis  risk. Weight  continues and is worsening. Advised to talk to MDrucilla Schmidt MD and advised low glycemic diet sheet. Can help with ge reflyux that can help cough  Nodule:had CT July 2015 chest: nodule stable since 2006.  No furhter CT needed   OV  01/06/2017  Chief Complaint  Patient presents with  . Follow-up    Pt last in 02/2014 for cough and lung nodule. Pt states she comes back today for a dry cough that has recently worsened. Pt denies chest congestion, f/c/s, CP/tightness. Pt states she has occ DOE.    Follow-up chronic cough  She returns after nearly 3 year hiatus. She says in the interim the cough actually improved further though not fully resolved but in the last few months the cough is worse associated with mild chronic sinus headache. She says in the interim sometime in the past she did see ENT and was reassured her vocal cords were normal. She continues to have chronic clearing of the throat. Current RSI cough score is 20 with major weight is coming from clearing of the throat and a sensation of something sticking in her throat. She continues to be on Symbicort and gabapentin 600 mg at night but these are not helping.  Last CT sinus showed mild sinusitis in 2014. Last CT chest high resolution was in  2015 without any evidence of interstitial lung disease. Last pulmonary function test was in 2014 that showed obstruction   Dr Lorenza Cambridge Reflux Symptom Index (> 13-15 suggestive of LPR cough) 02/25/2013 Score for past 2 months 05/21/2013 S/p sinus, gerd and speech Rx 07/26/2013 After starting breo in additioin to abpove 09/06/2013 ssaline sinus, no nasal steroid, symbiRx, ppi+ but no neurontin 10/16/2013 After starting gabapentin  02/13/2014  01/06/2017 pon gabapentin 639m qhs  Hoarseness of problem with voice _0 Clearing  Of Throat _1 Excess throat mucus or feeling of post nasal drip _2 Difficulty swallowing food, liquid or tablets 3 1 0 0 0 0 0  Cough  after eating or lying down _3 Breathing difficulties or choking episodes _4 0 0 0 0  Troublesome or annoying cough _5 Sensation of something sticking in throat or lump in throat _6 4.5 1 0 5  Heartburn, chest pain, indigestion, or stomach acid coming up _7 0  TOTAL 33 25 21 30._8 has a past medical history of Allergy; Arthritis; Asthma; Chronic back pain; Chronic leg pain; Cocaine abuse; Diabetes (HComo; GERD (gastroesophageal reflux disease); Hyperlipidemia; Hypertension; Neuromuscular disorder ([place]; RECTAL BLEEDING (12/09/2008); Sleep apnea; Tobacco abuse; and Uterine fibroid.   reports that she quit smoking about 18 years ago. Her smoking use included Cigarettes. She has a 10.00 pack-year smoking history. She has never used smokeless tobacco.  Past Surgical History:  Procedure Laterality Date  . BACK SURGERY  2012   L5-S1 microendoscopic disectomy last surgery 06/2011  . BREAST BIOPSY     LEFT    01/19/16  . BREAST REDUCTION SURGERY  1982  . COLONOSCOPY    . HAND SURGERY     MIDDLE TRIGGER FINGER RIGHT SIDE  . RADIOACTIVE SEED GUIDED EXCISIONAL BREAST BIOPSY Left 02/18/2016   Procedure: LEFT RADIOACTIVE SEED GUIDED EXCISIONAL BREAST BIOPSY;  Surgeon: DAlphonsa Overall MD;  [facility]  Service: General;  Laterality: Left;  . TONSILLECTOMY  2008  . TOTAL ABDOMINAL HYSTERECTOMY  05/24/2007   hysterectomy  . TUBAL LIGATION    . UPPER GASTROINTESTINAL ENDOSCOPY      Allergies  Allergen Reactions  . Losartan Cough    Immunization History  Administered Date(s) Administered  . Influenza Split 03/11/2011, 03/02/2012  . Influenza Whole 02/12/2010  . Influenza,inj,Quad PF,36+ Mos 03/08/2013, 02/27/2014, 05/19/2015, 04/19/2016  . PPD Test 10/04/2010, 07/11/2011, 12/05/2012  . Pneumococcal Polysaccharide-23 01/13/2012  . Tdap 11/18/2010    Family History  Problem Relation Age of Onset  . Diabetes Father   . Heart disease  Father   . Hypertension Father   . Diabetes Mother   . Cancer Mother        brain  . Hypertension Mother   . Kidney disease Sister   . Diabetes Sister   . Kidney cancer Sister   . Colon cancer Neg Hx   . Colon polyps Neg Hx   . Esophageal cancer Neg Hx   . Rectal cancer Neg Hx   . Stomach cancer Neg Hx      Current Outpatient Prescriptions:  .  acetaminophen (TYLENOL) 500 MG tablet, Take 2 tablets (1,000 mg total) by mouth every 8 (eight) hours as  needed. DO NOT take more than 6 tablets per day., Disp: 30 tablet, Rfl: 0 .  acyclovir (ZOVIRAX) 400 MG tablet, Take 1 tablet (400 mg total) by mouth 3 (three) times daily as needed (flares)., Disp: 90 tablet, Rfl: 3 .  albuterol (PROAIR HFA) 108 (90 Base) MCG/ACT inhaler, Inhale 2 puffs into the lungs every 4 (four) hours as needed for wheezing or shortness of breath., Disp: 3 Inhaler, Rfl: 2 .  aspirin 81 MG EC tablet, Take 81 mg by mouth daily.  , Disp: , Rfl:  .  Blood Glucose Monitoring Suppl (ONETOUCH VERIO) w/Device KIT, 1 each by Does not apply route 3 (three) times daily., Disp: 1 kit, Rfl: 0 .  budesonide-formoterol (SYMBICORT) 80-4.5 MCG/ACT inhaler, Inhale 2 puffs into the lungs 2 (two) times daily as needed (shortness of breath)., Disp: 1 Inhaler, Rfl: 5 .  CALCIUM PO, Take 1 tablet by mouth every evening., Disp: , Rfl:  .  chlorpheniramine (CHLORPHEN) 4 MG tablet, Take 1 tab in AM and 2 tabs in PM, Disp: , Rfl:  .  gabapentin (NEURONTIN) 300 MG capsule, Take 3 capsules (900 mg total) by mouth at bedtime., Disp: 270 capsule, Rfl: 3 .  glucose blood (ONETOUCH VERIO) test strip, Check blood sugar 3 times a day, Disp: 100 each, Rfl: 12 .  hydrochlorothiazide (HYDRODIURIL) 25 MG tablet, Take 1 tablet (25 mg total) by mouth daily., Disp: 90 tablet, Rfl: 0 .  ibuprofen (ADVIL,MOTRIN) 600 MG tablet, , Disp: , Rfl:  .  insulin aspart protamine- aspart (NOVOLOG MIX 70/30) (70-30) 100 UNIT/ML injection, Inject 85 units into skin twice  daily., Disp: 10 mL, Rfl: 0 .  Insulin Pen Needle (B-D UF III MINI PEN NEEDLES) 31G X 5 MM MISC, Use to inject insulin twice a day, Disp: 190 each, Rfl: 5 .  liraglutide 18 MG/3ML SOPN, Inject 0.3 mLs (1.8 mg total) into the skin daily., Disp: 6 mL, Rfl: 5 .  metFORMIN (GLUCOPHAGE) 1000 MG tablet, Take 1 tablet (1,000 mg total) by mouth 2 (two) times daily with a meal., Disp: 180 tablet, Rfl: 3 .  Multiple Vitamin (MULTIVITAMIN WITH MINERALS) TABS tablet, Take 1 tablet by mouth daily., Disp: , Rfl:  .  ONETOUCH DELICA LANCETS FINE MISC, Check blood sugar 3 times a day, Disp: 100 each, Rfl: 12 .  oxyCODONE-acetaminophen (PERCOCET/ROXICET) 5-325 MG tablet, Take 1 tablet by mouth every 8 (eight) hours as needed. For back pain., Disp: 50 tablet, Rfl: 0 .  pantoprazole (PROTONIX) 40 MG tablet, Take 1 tablet (40 mg total) by mouth 2 (two) times daily., Disp: 180 tablet, Rfl: 0 .  rosuvastatin (CRESTOR) 20 MG tablet, Take 1 tablet (20 mg total) by mouth at bedtime., Disp: 90 tablet, Rfl: 0  Current Facility-Administered Medications:  .  0.9 %  sodium chloride infusion, 500 mL, Intravenous, Continuous, Ladene Artist, MD    Review of Systems     Objective:   Physical Exam  Constitutional: She is oriented to person, place, and time. She appears well-developed and well-nourished. No distress.  Obese female  HENT:  Head: Normocephalic and atraumatic.  Right Ear: External ear normal.  Left Ear: External ear normal.  Mouth/Throat: Oropharynx is clear and moist. No oropharyngeal exudate.  Constantly clearing her throat  Eyes: Pupils are equal, round, and reactive to light. Conjunctivae and EOM are normal. Right eye exhibits no discharge. Left eye exhibits no discharge. No scleral icterus.  Neck: Normal range of motion. Neck supple. No JVD present. No  tracheal deviation present. No thyromegaly present.  Cardiovascular: Normal rate, regular rhythm, normal heart sounds and intact distal pulses.  Exam  reveals no gallop and no friction rub.   No murmur heard. Pulmonary/Chest: Effort normal and breath sounds normal. No respiratory distress. She has no wheezes. She has no rales. She exhibits no tenderness.  Abdominal: Soft. Bowel sounds are normal. She exhibits no distension and no mass. There is no tenderness. There is no rebound and no guarding.  Musculoskeletal: Normal range of motion. She exhibits no edema or tenderness.  Lymphadenopathy:    She has no cervical adenopathy.  Neurological: She is alert and oriented to person, place, and time. She has normal reflexes. No cranial nerve deficit. She exhibits normal muscle tone. Coordination normal.  Skin: Skin is warm and dry. No rash noted. She is not diaphoretic. No erythema. No pallor.  Psychiatric: She has a normal mood and affect. Her behavior is normal. Judgment and thought content normal.  Vitals reviewed.  Vitals:   01/06/17 0930  BP: (!) 146/82  Pulse: 97  SpO2: 98%  Weight: 257 lb (116.6 kg)  Height: _0  (1.575 m)    Estimated body mass index is 47.01 kg/m as calculated from the following:   Height as of this encounter: _1  (1.575 m).   Weight as of this encounter: 257 lb (116.6 kg).     Assessment:       ICD-10-CM   1. Chronic cough R05   2. Irritable larynx J38.7   3. Lung nodule < 6cm on CT R91.1        Plan:      Unclear why you are having chronic refractory cough despite gabapentin and despite being on Symbicort for obstructive lung disease and having a negative ENT evaluation in the past. Exhaled nitric oxide test today shows no evidence of active asthma because it is normal  PLAN- We should  regroup and restart all the workup because it has been nearly  3 years- 4 years Do high resolution CT chest Do CT sinus without contrast Do full pulmonary function test For now continue Symbicort and gabapentin Based on these test results we might slowly discontinue Symbicort and/or gabapentin We might even  consider referring you to cough research protocol   follow-up - Next few to several weeks but after completing the above tests; okay to see my nurse practitioner [name] [place]  > 50% of this > 25 min visit spent in face to face counseling or coordination of care      Dr. Brand Males, M.D., [facility].C.P Pulmonary and Critical Care Medicine Staff Physician [facility] Pager: [phone], If no answer or between  15:00h - 7:00h: call [phone]  01/06/2017 10:04 AM

## 2017-01-06 NOTE — Patient Instructions (Addendum)
ICD-10-CM   1. Chronic cough R05   2. Irritable larynx J38.7   3. Lung nodule < 6cm on CT R91.1     Unclear why you are having chronic refractory cough despite gabapentin and despite being on Symbicort for obstructive lung disease and having a negative ENT evaluation in the past. Exhaled nitric oxide test today shows no evidence of active asthma because it is normal  PLAN- We should  regroup and restart all the workup because it has been nearly  3 years- 4 years Do high resolution CT chest Do CT sinus without contrast Do full pulmonary function test For now continue Symbicort and gabapentin Based on these test results we might slowly discontinue Symbicort and/or gabapentin We might even consider referring you to cough research protocol   follow-up - Next few to several weeks but after completing the above tests; okay to see my nurse practitioner Patricia Nettle

## 2017-01-06 NOTE — Addendum Note (Signed)
Addended by: Collier Salina on: 01/06/2017 12:06 PM   Modules accepted: Orders

## 2017-01-10 ENCOUNTER — Encounter: Payer: Self-pay | Admitting: *Deleted

## 2017-01-10 ENCOUNTER — Encounter: Payer: Self-pay | Admitting: Internal Medicine

## 2017-01-10 ENCOUNTER — Ambulatory Visit (INDEPENDENT_AMBULATORY_CARE_PROVIDER_SITE_OTHER): Payer: PPO | Admitting: Internal Medicine

## 2017-01-10 VITALS — BP 139/75 | HR 99 | Temp 97.7°F | Ht 62.0 in | Wt 261.0 lb

## 2017-01-10 DIAGNOSIS — E785 Hyperlipidemia, unspecified: Secondary | ICD-10-CM

## 2017-01-10 DIAGNOSIS — M545 Low back pain: Secondary | ICD-10-CM | POA: Diagnosis not present

## 2017-01-10 DIAGNOSIS — Z833 Family history of diabetes mellitus: Secondary | ICD-10-CM

## 2017-01-10 DIAGNOSIS — Z79899 Other long term (current) drug therapy: Secondary | ICD-10-CM

## 2017-01-10 DIAGNOSIS — E1140 Type 2 diabetes mellitus with diabetic neuropathy, unspecified: Secondary | ICD-10-CM

## 2017-01-10 DIAGNOSIS — Z794 Long term (current) use of insulin: Secondary | ICD-10-CM | POA: Diagnosis not present

## 2017-01-10 DIAGNOSIS — Z8249 Family history of ischemic heart disease and other diseases of the circulatory system: Secondary | ICD-10-CM

## 2017-01-10 DIAGNOSIS — G8929 Other chronic pain: Secondary | ICD-10-CM | POA: Diagnosis not present

## 2017-01-10 DIAGNOSIS — Z87891 Personal history of nicotine dependence: Secondary | ICD-10-CM

## 2017-01-10 DIAGNOSIS — I10 Essential (primary) hypertension: Secondary | ICD-10-CM | POA: Diagnosis not present

## 2017-01-10 LAB — GLUCOSE, CAPILLARY: Glucose-Capillary: 117 mg/dL — ABNORMAL HIGH (ref 65–99)

## 2017-01-10 MED ORDER — OXYCODONE-ACETAMINOPHEN 5-325 MG PO TABS: 1.0000 | ORAL_TABLET | Freq: Three times a day (TID) | ORAL | 0 refills | Status: DC | PRN

## 2017-01-10 MED ORDER — GLUCOSE BLOOD VI STRP: ORAL_STRIP | 5 refills | Status: DC

## 2017-01-10 MED ORDER — ONETOUCH VERIO FLEX SYSTEM W/DEVICE KIT: 1.0000 | PACK | Freq: Three times a day (TID) | 1 refills | Status: DC

## 2017-01-10 MED ORDER — INSULIN REGULAR HUMAN (CONC) 500 UNIT/ML ~~LOC~~ SOLN: SUBCUTANEOUS | 1 refills | Status: DC

## 2017-01-10 MED ORDER — V-GO 20 KIT: PACK | 0 refills | Status: DC

## 2017-01-10 NOTE — Assessment & Plan Note (Signed)
Assessment Currently on hydrochlorothiazide 25 mg daily. Blood pressure 139/75 at this visit.  Plan -Continue current management

## 2017-01-10 NOTE — Assessment & Plan Note (Signed)
Assessment Patient is requesting a refill on Percocet for chronic lower back pain. Review of [place] controlled substances database during previous visit did not show any red flags. Medication was last refilled during her previous visit on 11/30/2016.  Plan -Refill Percocet 5-325 one tablet every 8 hours as needed #50

## 2017-01-10 NOTE — Assessment & Plan Note (Deleted)
Assessment Patient currently on rosuvastatin 20 mg daily.  Plan -Recheck lipid panel at this visit

## 2017-01-10 NOTE — Assessment & Plan Note (Addendum)
Assessment Patient currently on rosuvastatin 20 mg daily.  Plan -Recheck lipid panel at this visit  Addendum 01/11/2017: Lipid panel showing cholesterol 149, triglycerides 112, and HDL 40. Continue high intensity statin. Spoke to the patient over the phone.

## 2017-01-10 NOTE — Progress Notes (Signed)
   CC: Patient is here for a follow-up of her diabetes. She is also requesting a refill on Percocet for her chronic lower back pain.  HPI:  [name] is a 56 y.o. female with a past medical history of conditions listed below presenting for a follow-up of her diabetes. She is also requesting refill on Percocet for chronic lower back pain. Please see problem based charting for the status of the patient's current and chronic medical conditions.   Past Medical History:  Diagnosis Date  . Allergy   . Arthritis    back   . Asthma    AS CHILD  . Chronic back pain   . Chronic leg pain    due to back pain  . Cocaine abuse    in remission  . Diabetes ([place])    with neuropathy  . GERD (gastroesophageal reflux disease)   . Hyperlipidemia   . Hypertension   . Neuromuscular disorder (HCC)    neuropathy  . RECTAL BLEEDING 12/09/2008   Annotation: s/p EGD 7/08 mild gastritis, s/p colonoscopy 7/08- benign polyp  s/p polypectomy and isolated diverticulum.  Qualifier: Diagnosis of  By: Ditzler RN, Debra    . Sleep apnea    CPAP    YEARS AGO DONE 1/2 YEARS AGO AND WAS TOLD DID NOT HAVE  . Tobacco abuse   . Uterine fibroid    s/p hysterectomy   Review of Systems: Pertinent positives mentioned in HPI. Remainder of all ROS negative.   Physical Exam:  Vitals:   01/10/17 1543  BP: 139/75  Pulse: 99  Temp: 97.7 F (36.5 C)  TempSrc: Oral  SpO2: 98%  Weight: 261 lb (118.4 kg)  Height: 5\' 2"  (1.575 m)   Physical Exam  Constitutional: She is oriented to person, place, and time. She appears well-developed and well-nourished. No distress.  HENT:  Head: Normocephalic and atraumatic.  Eyes: Right eye exhibits no discharge. Left eye exhibits no discharge.  Cardiovascular: Normal rate, regular rhythm and intact distal pulses.   Pulmonary/Chest: Effort normal and breath sounds normal. No respiratory distress. She has no wheezes. She has no rales.  Abdominal: Soft. Bowel sounds are normal. She  exhibits no distension. There is no tenderness.  Musculoskeletal: She exhibits no edema.  Neurological: She is alert and oriented to person, place, and time.  Skin: Skin is warm and dry.    Assessment & Plan:   See Encounters Tab for problem based charting.  Patient discussed with Dr. Daryll Drown

## 2017-01-10 NOTE — Patient Instructions (Addendum)
[name] it was nice seeing you today.  Stop using NovoLog 70/30  Start using Vgo 20 as instructed: use 2 clicks 3 times a day with meals  Return for a follow-up in 2 weeks.

## 2017-01-10 NOTE — Assessment & Plan Note (Signed)
Assessment A1c 8.4 on 11/29/2016. Patient is currently taking metformin 1000 g twice daily, Victoza 1.8 mg daily, Januvia 100 mg daily, and NovoLog 70/3080 units twice daily. Meter at this visit showing inaccurate dates making it difficult to assess her CBGs, although overall CBGs above goal.  Plan -Stop NovoLog 70/30 -Start VGo 20 with concentrated Humulin R: Total daily dose 160 units divided into 100 basal and 60 preprandial. -She received a new meter at this visit -Return to the clinic in 2 weeks with meter

## 2017-01-11 ENCOUNTER — Other Ambulatory Visit: Payer: Self-pay | Admitting: Internal Medicine

## 2017-01-11 ENCOUNTER — Other Ambulatory Visit: Payer: Self-pay | Admitting: *Deleted

## 2017-01-11 ENCOUNTER — Telehealth: Payer: Self-pay

## 2017-01-11 DIAGNOSIS — Z794 Long term (current) use of insulin: Principal | ICD-10-CM

## 2017-01-11 DIAGNOSIS — E1140 Type 2 diabetes mellitus with diabetic neuropathy, unspecified: Secondary | ICD-10-CM

## 2017-01-11 LAB — LIPID PANEL
CHOLESTEROL TOTAL: 149 mg/dL (ref 100–199)
Chol/HDL Ratio: 3.7 ratio (ref 0.0–4.4)
HDL: 40 mg/dL (ref >39)
LDL Calculated: 87 mg/dL (ref 0–99)
TRIGLYCERIDES: 112 mg/dL (ref 0–149)
VLDL CHOLESTEROL CAL: 22 mg/dL (ref 5–40)

## 2017-01-11 NOTE — Telephone Encounter (Signed)
Needs to speak with a nurse about meds. Please call back.  

## 2017-01-11 NOTE — Telephone Encounter (Signed)
Spoke w/ pt and pharmacy, resent 2 scripts

## 2017-01-12 ENCOUNTER — Telehealth: Payer: Self-pay | Admitting: Dietician

## 2017-01-12 MED ORDER — GLUCOSE BLOOD VI STRP: ORAL_STRIP | 5 refills | Status: DC

## 2017-01-12 MED ORDER — ONETOUCH VERIO FLEX SYSTEM W/DEVICE KIT: 1.0000 | PACK | Freq: Three times a day (TID) | 1 refills | Status: DC

## 2017-01-12 NOTE — Telephone Encounter (Signed)
Called [facility] [address]- they sent prior authorization forms for the VGo and they Humulin u500. Patient notified that we are working on PAs.

## 2017-01-12 NOTE — Telephone Encounter (Signed)
[facility]ed [facility] [place]- they do not have  patients information- [facility] uses [facility] [address]

## 2017-01-13 ENCOUNTER — Ambulatory Visit: Payer: PPO | Admitting: Dietician

## 2017-01-13 ENCOUNTER — Telehealth: Payer: Self-pay | Admitting: *Deleted

## 2017-01-13 NOTE — Progress Notes (Signed)
Internal Medicine Clinic Attending  Case discussed with Dr. Rathoreat the time of the visit. We reviewed the resident's history and exam and pertinent patient test results. I agree with the assessment, diagnosis, and plan of care documented in the resident's note.  

## 2017-01-13 NOTE — Telephone Encounter (Signed)
Call to [facility] for PA for Humulin R U-500 vial and V-GO 20 Kit spoke with Robin.  PA information was completed over the phone.  Response was marked as urgent.  Case # for [id number] and UGO-20 is [id number].  Will await response within 24 hours.  Patient to be contacted as well by [facility].  Sander Nephew, RN 01/13/2017 9:45 AM.

## 2017-01-17 ENCOUNTER — Telehealth: Payer: Self-pay | Admitting: Dietician

## 2017-01-17 DIAGNOSIS — E1140 Type 2 diabetes mellitus with diabetic neuropathy, unspecified: Secondary | ICD-10-CM

## 2017-01-17 DIAGNOSIS — Z794 Long term (current) use of insulin: Principal | ICD-10-CM

## 2017-01-17 NOTE — Telephone Encounter (Signed)
Called patient to see if she had picked up the vial or U500 and Vgo 20

## 2017-01-19 ENCOUNTER — Ambulatory Visit: Payer: PPO | Admitting: Dietician

## 2017-01-20 ENCOUNTER — Other Ambulatory Visit: Payer: PPO

## 2017-01-20 ENCOUNTER — Telehealth: Payer: Self-pay | Admitting: *Deleted

## 2017-01-20 NOTE — Telephone Encounter (Signed)
[name] called having just been at her pharmacy, The Vgo was approved but the Humulin R u500 was not per her pharmacy. Will ask for assistance with PA

## 2017-01-20 NOTE — Telephone Encounter (Signed)
Call to [place] to confirm patient's medication PA for V-GO 20 and Humulin R .  Spoke with Katharine Look who said that the Humulin R was approved on 01/13/2017 thru 06/12/2017.  The V-GO 20 was approved 01/20/2017 thru 06/12/2017.  Earlean Plyler spoke with patient to inform her of the approvals. Sander Nephew, RN 01/20/2017 4:41 PM

## 2017-01-20 NOTE — Telephone Encounter (Signed)
Called [facility] [address] per [facility] request as we are told the PA for both have been approved. [name] says both prescriptions were sent to [facility] instead of [address]: It was covered(free) but they have to order it and it will be in on Monday. She will need 100cc 1/2 in length syring to fill the Vgo. Request prescription.

## 2017-01-23 ENCOUNTER — Ambulatory Visit (INDEPENDENT_AMBULATORY_CARE_PROVIDER_SITE_OTHER)
Admission: RE | Admit: 2017-01-23 | Discharge: 2017-01-23 | Disposition: A | Discharge status: Home / Self Care | Payer: PPO | Source: Ambulatory Visit | Attending: Internal Medicine | Admitting: Internal Medicine

## 2017-01-23 ENCOUNTER — Encounter (INDEPENDENT_AMBULATORY_CARE_PROVIDER_SITE_OTHER): Payer: Self-pay

## 2017-01-23 DIAGNOSIS — R911 Solitary pulmonary nodule: Secondary | ICD-10-CM | POA: Diagnosis not present

## 2017-01-23 DIAGNOSIS — R05 Cough: Secondary | ICD-10-CM | POA: Diagnosis not present

## 2017-01-23 DIAGNOSIS — IMO0001 Reserved for inherently not codable concepts without codable children: Secondary | ICD-10-CM

## 2017-01-23 DIAGNOSIS — R053 Chronic cough: Secondary | ICD-10-CM

## 2017-01-23 DIAGNOSIS — R918 Other nonspecific abnormal finding of lung field: Secondary | ICD-10-CM | POA: Diagnosis not present

## 2017-01-23 MED ORDER — "INSULIN SYRINGE 30G X 1/2"" 1 ML MISC": 5 refills | Status: DC

## 2017-01-24 ENCOUNTER — Encounter: Payer: PPO | Admitting: Internal Medicine

## 2017-01-25 ENCOUNTER — Ambulatory Visit (INDEPENDENT_AMBULATORY_CARE_PROVIDER_SITE_OTHER): Payer: PPO | Admitting: Internal Medicine

## 2017-01-25 ENCOUNTER — Ambulatory Visit (INDEPENDENT_AMBULATORY_CARE_PROVIDER_SITE_OTHER): Payer: PPO | Admitting: Dietician

## 2017-01-25 VITALS — BP 150/68 | HR 86 | Temp 97.7°F | Wt 262.4 lb

## 2017-01-25 DIAGNOSIS — Z87891 Personal history of nicotine dependence: Secondary | ICD-10-CM

## 2017-01-25 DIAGNOSIS — R05 Cough: Secondary | ICD-10-CM | POA: Diagnosis not present

## 2017-01-25 DIAGNOSIS — Z794 Long term (current) use of insulin: Secondary | ICD-10-CM

## 2017-01-25 DIAGNOSIS — E1140 Type 2 diabetes mellitus with diabetic neuropathy, unspecified: Secondary | ICD-10-CM

## 2017-01-25 DIAGNOSIS — Z79899 Other long term (current) drug therapy: Secondary | ICD-10-CM | POA: Diagnosis not present

## 2017-01-25 DIAGNOSIS — E119 Type 2 diabetes mellitus without complications: Secondary | ICD-10-CM | POA: Diagnosis not present

## 2017-01-25 DIAGNOSIS — Z713 Dietary counseling and surveillance: Secondary | ICD-10-CM | POA: Diagnosis not present

## 2017-01-25 DIAGNOSIS — E1142 Type 2 diabetes mellitus with diabetic polyneuropathy: Secondary | ICD-10-CM

## 2017-01-25 DIAGNOSIS — Z833 Family history of diabetes mellitus: Secondary | ICD-10-CM | POA: Diagnosis not present

## 2017-01-25 DIAGNOSIS — R053 Chronic cough: Secondary | ICD-10-CM

## 2017-01-25 MED ORDER — CETIRIZINE HCL 10 MG PO CAPS: ORAL_CAPSULE | ORAL | 0 refills | Status: DC

## 2017-01-25 MED ORDER — BENZONATATE 100 MG PO CAPS: 100.0000 mg | ORAL_CAPSULE | Freq: Three times a day (TID) | ORAL | 1 refills | Status: DC | PRN

## 2017-01-25 MED ORDER — FLUTICASONE PROPIONATE 50 MCG/ACT NA SUSP
1.0000 | Freq: Every day | NASAL | 2 refills | Status: DC
Start: 2017-01-25 — End: 2017-09-18

## 2017-01-25 NOTE — Progress Notes (Signed)
Diabetes Self-Management Education  Visit Type:  Follow-up  Appt. Start Time: 830 Appt. End Time: 900  01/25/2017  [name], identified by name and date of birth, is a 56 y.o. female with a diagnosis of Diabetes:  .   ASSESSMENT  There were no vitals taken for this visit. There is no height or weight on file to calculate BMI.       Diabetes Self-Management Education - 01/25/17 1300      Health Coping   How would you rate your overall health? Good     Subsequent Visit   Since your last visit have you continued or begun to take your medications as prescribed? Yes   Since your last visit have you had your blood pressure checked? Yes   Is your most recent blood pressure lower, unchanged, or higher since your last visit? Unchanged      Learning Objective:  Patient will have a greater understanding of diabetes self-management. Patient education plan is to attend individual and/or group sessions per assessed needs and concerns. My plan to support myself in continuing these changes to care for my diabetes is to attend or contact:   local support resources -doctor's office, CDE, Dietitian, pharmacist, church  Plan:   Patient Instructions  The vgo 20 will be putting 4.17units per hour, each click will give you 10 units.   Check your blood sugar often today- at least 4 times.  Start using 2 clicks tonight for dinner.  Can use 2 more clicks tomorrow for breakfast before you take this VGo off and put another one on. ( arouind 9-930 AM.   I will work on getting the syringes  Call with questions [name] [phone]    Expected Outcomes:     Education material provided: Support group flyer  If problems or questions, patient to contact team via:  Phone  Future DSME appointment: -  2 months Plyler, Butch Penny, [place] 01/25/2017 2:03 PM.

## 2017-01-25 NOTE — Assessment & Plan Note (Addendum)
Assessment A1c checked during previous visit on 11/29/2016 was 8.4. She is currently on metformin 1000 mg twice daily, Victoza 1.8 mg daily, Januvia 100 mg daily, and NovoLog 70/30 mix. Patient is using 85 units of NovoLog 70/30 twice daily. She was not able to start using VGo as she was not able to get insulin and testing supplies from the pharmacy. Patient met with Serenidy prior to this visit and now has everything needed to get started on Vgo.   Plan -Stop NovoLog 70/30 -Start VGo: 100 units basal and 30 units preprandial 3 times a day -Continue other medications as above -Return to the clinic in 2 weeks with her meter

## 2017-01-25 NOTE — Patient Instructions (Signed)
Ms. Choung it was nice seeing you today.  For diabetes:  -Start using VGo as instructed  -Stop using NovoLog 70/30  -Continue using Victoza  -Continue taking metformin and Januvia  -Return to the clinic in 2 weeks with your meter   For your cough:  -Start taking Zyrtec daily  -Use Flonase nasal spray daily as instructed  -Take Tessalon Perles as needed  -Please go to your appointment with pulmonology on 01/31/2017

## 2017-01-25 NOTE — Progress Notes (Signed)
   CC: Patient is here to discuss her diabetes and chronic cough.  HPI:  [name] is a 56 y.o. female with a past medical history of conditions listed below presenting to the clinic to discuss her diabetes and chronic cough. Please see problem based charting for the status of the patient's current and chronic medical conditions.   Past Medical History:  Diagnosis Date  . Allergy   . Arthritis    back   . Asthma    AS CHILD  . Chronic back pain   . Chronic leg pain    due to back pain  . Cocaine abuse    in remission  . 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 GERD (gastroesophageal reflux disease)   . Hyperlipidemia   . Hypertension   . Neuromuscular disorder (HCC)    neuropathy  . RECTAL BLEEDING 12/09/2008   Annotation: s/p EGD 7/08 mild gastritis, s/p colonoscopy 7/08- benign polyp  s/p polypectomy and isolated diverticulum.  Qualifier: Diagnosis of  By: Ditzler RN, Debra    . Sleep apnea    CPAP    YEARS AGO DONE 1/2 YEARS AGO AND WAS TOLD DID NOT HAVE  . Tobacco abuse   . Uterine fibroid    s/p hysterectomy   Review of Systems: Pertinent positives mentioned in HPI. Remainder of all ROS negative.   Physical Exam:  Vitals:   01/25/17 0942  BP: (!) 150/68  Pulse: 86  Temp: 97.7 F (36.5 C)  TempSrc: Oral  Weight: 262 lb 6.4 oz (119 kg)   Physical Exam  Constitutional: She is oriented to person, place, and time. She appears well-developed and well-nourished. No distress.  HENT:  Head: Normocephalic and atraumatic.  Mouth/Throat: No oropharyngeal exudate.  Nasal mucosa appears erythematous with dry/ crusted nasal secretions.  Eyes: Right eye exhibits no discharge. Left eye exhibits no discharge.  Cardiovascular: Normal rate, regular rhythm and intact distal pulses.   Pulmonary/Chest: Effort normal and breath sounds normal. No respiratory distress. She has no wheezes. She has no rales.  Abdominal: Soft. Bowel sounds are normal. She exhibits no distension. There  is no tenderness.  Musculoskeletal: She exhibits no edema.  Neurological: She is alert and oriented to person, place, and time.  Skin: Skin is warm and dry.    Assessment & Plan:   See Encounters Tab for problem based charting.  Patient discussed with Dr. Dareen Piano

## 2017-01-25 NOTE — Patient Instructions (Addendum)
The vgo 20 will be putting 4.17units per hour in you, each click will give you 10 units.   Check your blood sugar often today- at least 4 times.  Start using 2 clicks tonight for dinner.  Can use 2 more clicks tomorrow for breakfast before you take this VGo off and put another one on. ( arouind 9-930 AM.   I will work on getting the syringes  Call with questions Marveline [phone]

## 2017-01-26 NOTE — Assessment & Plan Note (Signed)
History of present illness Patient continues to complain of her chronic cough at this visit. She is currently being followed by pulmonology Dr. Chase Caller. States the cough has been worse for the past 3 weeks. Denies having any sneezing, rhinorrhea, sore throat, or an itchy sensation in her throat. She is currently not on an ACE inhibitor. Her GERD is well controlled with twice daily dosing of Protonix. She continues to use her COPD medications including albuterol and Symbicort. Patient stopped smoking 20 years ago. Per documentation, she had a negative ENT evaluation in the past. CT of sinuses done on 01/23/2017 was negative. CT of chest done on 01/23/2017 showing stable pulmonary nodules but negative for interstitial lung disease. Patient is requesting Ladona Ridgel stating that is the only medication that helps with her cough.  Assessment Unclear etiology of her chronic cough. Workup so far has been negative. Cough could possibly be worse in the past few weeks due to postnasal drip. Noted to have erythema of her nasal mucosa with dried nasal secretions on exam.  Plan -Tessalon Perles -Flonase nasal spray  -Zyrtec -Follow up with pulmonology on August 21

## 2017-01-26 NOTE — Telephone Encounter (Addendum)
[facility]- they can fill it, but had to order them. They should be in in the next day or so. Patient notified.  blood sugar was 200 fasting this am, lunch 100s, she is happy with the AUE59 with P368 2 clicks for meals.

## 2017-01-27 NOTE — Progress Notes (Signed)
Internal Medicine Clinic Attending  Case discussed with Dr. Rathoreat the time of the visit. We reviewed the resident's history and exam and pertinent patient test results. I agree with the assessment, diagnosis, and plan of care documented in the resident's note.  

## 2017-01-31 ENCOUNTER — Ambulatory Visit (INDEPENDENT_AMBULATORY_CARE_PROVIDER_SITE_OTHER): Payer: PPO | Admitting: Internal Medicine

## 2017-01-31 ENCOUNTER — Encounter: Payer: Self-pay | Admitting: Adult Health

## 2017-01-31 ENCOUNTER — Ambulatory Visit (INDEPENDENT_AMBULATORY_CARE_PROVIDER_SITE_OTHER): Payer: PPO | Admitting: Adult Health

## 2017-01-31 DIAGNOSIS — R05 Cough: Secondary | ICD-10-CM | POA: Diagnosis not present

## 2017-01-31 DIAGNOSIS — R053 Chronic cough: Secondary | ICD-10-CM

## 2017-01-31 DIAGNOSIS — J449 Chronic obstructive pulmonary disease, unspecified: Secondary | ICD-10-CM

## 2017-01-31 LAB — PULMONARY FUNCTION TEST
DL/VA % pred: 109 %
DL/VA: 4.99 ml/min/mmHg/L
DLCO COR % PRED: 76 %
DLCO COR: 16.79 ml/min/mmHg
DLCO UNC % PRED: 75 %
DLCO unc: 16.47 ml/min/mmHg
FEF 25-75 POST: 0.72 L/s
FEF 25-75 PRE: 0.65 L/s
FEF2575-%CHANGE-POST: 11 %
FEF2575-%PRED-PRE: 31 %
FEF2575-%Pred-Post: 34 %
FEV1-%Change-Post: 5 %
FEV1-%PRED-POST: 66 %
FEV1-%PRED-PRE: 62 %
FEV1-POST: 1.34 L
FEV1-PRE: 1.26 L
FEV1FVC-%CHANGE-POST: 10 %
FEV1FVC-%PRED-PRE: 81 %
FEV6-%Change-Post: -3 %
FEV6-%PRED-PRE: 78 %
FEV6-%Pred-Post: 75 %
FEV6-Post: 1.86 L
FEV6-Pre: 1.93 L
FEV6FVC-%Change-Post: 0 %
FEV6FVC-%Pred-Post: 104 %
FEV6FVC-%Pred-Pre: 103 %
FVC-%CHANGE-POST: -4 %
FVC-%PRED-PRE: 75 %
FVC-%Pred-Post: 72 %
FVC-POST: 1.86 L
FVC-PRE: 1.94 L
POST FEV1/FVC RATIO: 72 %
PRE FEV1/FVC RATIO: 65 %
Post FEV6/FVC ratio: 100 %
Pre FEV6/FVC Ratio: 100 %
RV % pred: 116 %
RV: 2.13 L
TLC % pred: 84 %
TLC: 4.03 L

## 2017-01-31 MED ORDER — BUDESONIDE-FORMOTEROL FUMARATE 80-4.5 MCG/ACT IN AERO
2.0000 | INHALATION_SPRAY | Freq: Two times a day (BID) | RESPIRATORY_TRACT | 5 refills | Status: DC | PRN
Start: 2017-01-31 — End: 2017-10-23

## 2017-01-31 NOTE — Assessment & Plan Note (Addendum)
COPD /Asthma with chronic cough  Cont on trigger prevention with AR/GERD tx  GAbapentin  Add delsym to Tessalon  No ILD on CT chest

## 2017-01-31 NOTE — Patient Instructions (Signed)
Restart Symbicort 2 puffs Twice daily  , rinse after use .  Begin Delsym 2 tsp Twice daily  For cough As needed   Use Tessalon Three times a day  As needed  Cough .  Follow up Dr. Chase Caller in 2 months and As needed   Please contact office for sooner follow up if symptoms do not improve or worsen or seek emergency care

## 2017-01-31 NOTE — Progress Notes (Signed)
_0  ID: [name], female    DOB: 19-Sep-1960, 56 y.o.   MRN: [id number]  Chief Complaint  Patient presents with  . Follow-up    cough     Referring provider: Shela Leff, MD  HPI: 56 yo female former smoker seen for pulmonary consult 2014 for chronic cough. (~1 yr )   TEST Joya San              - July 2008 had endoscopy which showed mild gastritis. She is on proton pump inhibitor                        - esophagogram Aug 2014: IMPRESSION: Mild esophageal dysmotility.  CT chest 2006 -No embolus. Mild nodularity associated with right lower lobe subsegmental atx;  05/2013 >Patient underwent a pulmonary function test on December 12 that showed moderate airflow obstruction with FEV1 is 1.35 L/59%. Ratio 62. No sign change BD  05/2013 CT sinus showed no acute sinus disease  01/31/2017 Follow up : Chronic cough  Patient presents for a one-month follow-up. Patient has a history of chronic cough and was treated in 2014 2015 for persistent cough. She was noted to have moderate airflow obstruction on PFT. She was treated with gabapentin for cyclical cough. Patient was not seen in the office for around 3 years. And return last month for increased cough. She was set up for a CT sinus that was clear. High resolution CT chest showed no evidence of interstitial lung disease. Stable nodules since 2014 considered with benign etiology.  CT did note hepatic steatosis. Discussed with patient. She will need to follow with her primary care physician to discuss this. If any further or additional testing is indicated.Marland Kitchen PFT today shows similar moderate airflow obstruction  FEV1 62%,ratio 65 FVC 75% , no sign BD , DLCO 75% Patient says that she is using Tessalon was some improvement in her cough. She continues to be on gabapentin at bedtime. She does have Symbicort but does not take it on a regular basis. She denies any hemoptysis, chest pain, orthopnea, PND, or increased leg swelling. Cough is  probably dry in nature.   Allergies  Allergen Reactions  . Losartan Cough    Immunization History  Administered Date(s) Administered  . Influenza Split 03/11/2011, 03/02/2012  . Influenza Whole 02/12/2010  . Influenza,inj,Quad PF,6+ Mos 03/08/2013, 02/27/2014, 05/19/2015, 04/19/2016  . PPD Test 10/04/2010, 07/11/2011, 12/05/2012  . Pneumococcal Polysaccharide-23 01/13/2012  . Tdap 11/18/2010    Past Medical History:  Diagnosis Date  . Allergy   . Arthritis    back   . Asthma    AS CHILD  . Chronic back pain   . Chronic leg pain    due to back pain  . Cocaine abuse    in remission  . 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 GERD (gastroesophageal reflux disease)   . Hyperlipidemia   . Hypertension   . Neuromuscular disorder (HCC)    neuropathy  . RECTAL BLEEDING 12/09/2008   Annotation: s/p EGD 7/08 mild gastritis, s/p colonoscopy 7/08- benign polyp  s/p polypectomy and isolated diverticulum.  Qualifier: Diagnosis of  By: Ditzler RN, Debra    . Sleep apnea    CPAP    YEARS AGO DONE 1/2 YEARS AGO AND WAS TOLD DID NOT HAVE  . Tobacco abuse   . Uterine fibroid    s/p hysterectomy    Tobacco History: History  Smoking Status  . Former Smoker  .  Packs/day: 0.50  . Years: 20.00  . Types: Cigarettes  . Quit date: 07/23/1998  Smokeless Tobacco  . Never Used   Counseling given: Not Answered   Outpatient Encounter Prescriptions as of 01/31/2017  Medication Sig  . acetaminophen (TYLENOL) 500 MG tablet Take 2 tablets (1,000 mg total) by mouth every 8 (eight) hours as needed. DO NOT take more than 6 tablets per day.  Marland Kitchen acyclovir (ZOVIRAX) 400 MG tablet Take 1 tablet (400 mg total) by mouth 3 (three) times daily as needed (flares).  Marland Kitchen albuterol (PROAIR HFA) 108 (90 Base) MCG/ACT inhaler Inhale 2 puffs into the lungs every 4 (four) hours as needed for wheezing or shortness of breath.  Marland Kitchen aspirin 81 MG EC tablet Take 81 mg by mouth daily.    . benzonatate (TESSALON PERLES) 100  MG capsule Take 1 capsule (100 mg total) by mouth 3 (three) times daily as needed for cough.  . Blood Glucose Monitoring Suppl (ONETOUCH VERIO FLEX SYSTEM) w/Device KIT 1 each by Does not apply route 3 (three) times daily.  . budesonide-formoterol (SYMBICORT) 80-4.5 MCG/ACT inhaler Inhale 2 puffs into the lungs 2 (two) times daily as needed (shortness of breath).  . CALCIUM PO Take 1 tablet by mouth every evening.  . Cetirizine HCl (ZYRTEC ALLERGY) 10 MG CAPS Take 1 capsule by mouth daily for allergies.  . fluticasone (FLONASE) 50 MCG/ACT nasal spray Place 1 spray into both nostrils daily.  Marland Kitchen gabapentin (NEURONTIN) 300 MG capsule Take 3 capsules (900 mg total) by mouth at bedtime.  Marland Kitchen glucose blood (ONETOUCH VERIO) test strip Check blood sugar 3 times a day  . hydrochlorothiazide (HYDRODIURIL) 25 MG tablet Take 1 tablet (25 mg total) by mouth daily.  Marland Kitchen ibuprofen (ADVIL,MOTRIN) 600 MG tablet   . Insulin Disposable Pump (V-GO 20) KIT Use as instructed.  . Insulin Pen Needle (B-D UF III MINI PEN NEEDLES) 31G X 5 MM MISC Use to inject insulin twice a day  . insulin regular human CONCENTRATED (HUMULIN R) 500 UNIT/ML injection Use to fill Vgo 20 daily  . Insulin Syringe-Needle U-100 (INSULIN SYRINGE 1CC/30GX1/2") 30G X 1/2" 1 ML MISC Use to fill Vgo daily  . liraglutide 18 MG/3ML SOPN Inject 0.3 mLs (1.8 mg total) into the skin daily.  . metFORMIN (GLUCOPHAGE) 1000 MG tablet Take 1 tablet (1,000 mg total) by mouth 2 (two) times daily with a meal.  . Multiple Vitamin (MULTIVITAMIN WITH MINERALS) TABS tablet Take 1 tablet by mouth daily.  Glory Rosebush DELICA LANCETS FINE MISC Check blood sugar 3 times a day  . oxyCODONE-acetaminophen (PERCOCET/ROXICET) 5-325 MG tablet Take 1 tablet by mouth every 8 (eight) hours as needed. For back pain.  . pantoprazole (PROTONIX) 40 MG tablet Take 1 tablet (40 mg total) by mouth 2 (two) times daily.  . rosuvastatin (CRESTOR) 20 MG tablet Take 1 tablet (20 mg total) by  mouth at bedtime.  . [DISCONTINUED] budesonide-formoterol (SYMBICORT) 80-4.5 MCG/ACT inhaler Inhale 2 puffs into the lungs 2 (two) times daily as needed (shortness of breath).   Facility-Administered Encounter Medications as of 01/31/2017  Medication  . 0.9 %  sodium chloride infusion     Review of Systems  Constitutional:   No  weight loss, night sweats,  Fevers, chills, fatigue, or  lassitude.  HEENT:   No headaches,  Difficulty swallowing,  Tooth/dental problems, or  Sore throat,                No sneezing, itching, ear ache, nasal  congestion, post nasal drip,   CV:  No chest pain,  Orthopnea, PND, swelling in lower extremities, anasarca, dizziness, palpitations, syncope.   GI  No heartburn, indigestion, abdominal pain, nausea, vomiting, diarrhea, change in bowel habits, loss of appetite, bloody stools.   Resp:    No chest wall deformity  Skin: no rash or lesions.  GU: no dysuria, change in color of urine, no urgency or frequency.  No flank pain, no hematuria   MS:  No joint pain or swelling.  No decreased range of motion.  No back pain.    Physical Exam  BP 122/82 (BP Location: Left Arm, Patient Position: Sitting, Cuff Size: Normal)   Pulse 99   Ht _0  (1.575 m)   Wt 259 lb 3.2 oz (117.6 kg)   SpO2 93%   BMI 47.41 kg/m   GEN: A/Ox3; pleasant , NAD, obese    HEENT:  [place]/AT,  EACs-clear, TMs-wnl, NOSE-clear, THROAT-clear, no lesions, no postnasal drip or exudate noted.   NECK:  Supple w/ fair ROM; no JVD; normal carotid impulses w/o bruits; no thyromegaly or nodules palpated; no lymphadenopathy.    RESP  Clear  P & A; w/o, wheezes/ rales/ or rhonchi. no accessory muscle use, no dullness to percussion  CARD:  RRR, no m/r/g, no peripheral edema, pulses intact, no cyanosis or clubbing.  GI:   Soft & nt; nml bowel sounds; no organomegaly or masses detected.   Musco: Warm bil, no deformities or joint swelling noted.   Neuro: alert, no focal deficits noted.     Skin: Warm, no lesions or rashes    Lab Results:  CBC  BNP No results found for: BNP  ProBNP No results found for: PROBNP  Imaging: Ct Chest High Resolution  Result Date: 01/23/2017 CLINICAL DATA:  Chronic cough for over 1 year. Dyspnea. Follow-up pulmonary nodules. EXAM: CT CHEST WITHOUT CONTRAST TECHNIQUE: Multidetector CT imaging of the chest was performed following the standard protocol without intravenous contrast. High resolution imaging of the lungs, as well as inspiratory and expiratory imaging, was performed. COMPARISON:  12/25/2013 chest CT. FINDINGS: Cardiovascular: Normal heart size. No significant pericardial fluid/thickening. Great vessels are normal in course and caliber. Mediastinum/Nodes: No discrete thyroid nodules. Unremarkable esophagus. No pathologically enlarged axillary, mediastinal or gross hilar lymph nodes, noting limited sensitivity for the detection of hilar adenopathy on this noncontrast study. Stable atrophic thymic tissue in the anterior mediastinum with internal fatty degeneration. Lungs/Pleura: No pneumothorax. No pleural effusion. Several scattered solid pulmonary nodules in both lungs, largest 7 mm in the basilar right lower lobe (series 5/image 90), all stable since at least 05/24/2013, considered benign. No acute consolidative airspace disease, lung masses or new significant pulmonary nodules. Mild patchy air trapping in both lungs, not appreciably changed. No significant regions of subpleural reticulation, ground-glass attenuation, traction bronchiectasis, architectural distortion or frank honeycombing. Upper abdomen: Diffuse hepatic steatosis. Musculoskeletal: No aggressive appearing focal osseous lesions. Mild thoracic spondylosis. IMPRESSION: 1. No evidence of interstitial lung disease. 2. Scattered solid pulmonary nodules are all stable since at least 2014 and considered benign. 3. Mild patchy air trapping, unchanged, indicative of small airways disease.  4. Diffuse hepatic steatosis. Electronically Signed   By: Ilona Sorrel M.D.   On: 01/23/2017 09:29   Ct Maxillofacial Ltd Wo Cm  Result Date: 01/23/2017 CLINICAL DATA:  Chronic cough for over year. EXAM: CT PARANASAL SINUS LIMITED WITHOUT CONTRAST TECHNIQUE: Non-contiguous multidetector CT images of the paranasal sinuses were obtained in a single plane without  contrast. COMPARISON:  05/24/2013 FINDINGS: Selected slices of the paranasal sinuses are clear of fluid levels or mucosal thickening. Nasal cavity is patent without nodularity or significant septal spurring. Left maxillary infundibulum was partially covered and are patent. Negative orbital, intracranial, and soft tissue structures were visualized. IMPRESSION: Negative study.  Clear paranasal sinuses and nasal cavity. Electronically Signed   By: Monte Fantasia M.D.   On: 01/23/2017 09:18     Assessment & Plan:   COPD (chronic obstructive pulmonary disease) Moderate  Moderate COPD /Asthma  PFT is stable since 2014.  Would restart Symbicort Twice daily    Chronic cough COPD /Asthma with chronic cough  Cont on trigger prevention with AR/GERD tx  GAbapentin  Add delsym to Tessalon  No ILD on CT chest       Rexene Edison, NP 01/31/2017

## 2017-01-31 NOTE — Progress Notes (Signed)
PFT done today. 

## 2017-01-31 NOTE — Assessment & Plan Note (Signed)
Moderate COPD /Asthma  PFT is stable since 2014.  Would restart Symbicort Twice daily

## 2017-02-07 ENCOUNTER — Other Ambulatory Visit: Payer: Self-pay | Admitting: *Deleted

## 2017-02-09 ENCOUNTER — Telehealth: Payer: Self-pay | Admitting: Dietician

## 2017-02-09 NOTE — Telephone Encounter (Signed)
Called to follow up on vgo20 with u500 insulin:her blood sugars have been  99 lowest, highest 360- had back pain that day. Mostly in the 100s, a couple in 200s, still waiting on syringes, checks sugar 3x/day. Mornings are higher, 138, 194, lunch and dinner.  She's set a goal to join the [employer] sept 1, has started walking and is trying to makes healthier food choices. Encouraged follow up appointment after 03/01/17 for a1c.  Addendum: spoke with [facility], they couldn't get the 1cc, 30g, 1/2", told them okay to sub 27g, 1cc, 5/8" .

## 2017-02-15 ENCOUNTER — Emergency Department (HOSPITAL_COMMUNITY): Payer: No Typology Code available for payment source

## 2017-02-15 ENCOUNTER — Emergency Department (HOSPITAL_COMMUNITY)
Admission: EM | Admit: 2017-02-15 | Discharge: 2017-02-16 | Disposition: A | Discharge status: Home / Self Care | Payer: No Typology Code available for payment source | Attending: Emergency Medicine | Admitting: Emergency Medicine

## 2017-02-15 DIAGNOSIS — J449 Chronic obstructive pulmonary disease, unspecified: Secondary | ICD-10-CM | POA: Diagnosis not present

## 2017-02-15 DIAGNOSIS — Y939 Activity, unspecified: Secondary | ICD-10-CM | POA: Diagnosis not present

## 2017-02-15 DIAGNOSIS — Z7982 Long term (current) use of aspirin: Secondary | ICD-10-CM | POA: Insufficient documentation

## 2017-02-15 DIAGNOSIS — Z794 Long term (current) use of insulin: Secondary | ICD-10-CM | POA: Diagnosis not present

## 2017-02-15 DIAGNOSIS — R51 Headache: Secondary | ICD-10-CM | POA: Diagnosis not present

## 2017-02-15 DIAGNOSIS — S0990XA Unspecified injury of head, initial encounter: Secondary | ICD-10-CM | POA: Diagnosis present

## 2017-02-15 DIAGNOSIS — Y999 Unspecified external cause status: Secondary | ICD-10-CM | POA: Diagnosis not present

## 2017-02-15 DIAGNOSIS — S060X0A Concussion without loss of consciousness, initial encounter: Secondary | ICD-10-CM

## 2017-02-15 DIAGNOSIS — E1140 Type 2 diabetes mellitus with diabetic neuropathy, unspecified: Secondary | ICD-10-CM | POA: Diagnosis not present

## 2017-02-15 DIAGNOSIS — I10 Essential (primary) hypertension: Secondary | ICD-10-CM | POA: Diagnosis not present

## 2017-02-15 DIAGNOSIS — J45909 Unspecified asthma, uncomplicated: Secondary | ICD-10-CM | POA: Insufficient documentation

## 2017-02-15 DIAGNOSIS — S279XXA Injury of unspecified intrathoracic organ, initial encounter: Secondary | ICD-10-CM | POA: Diagnosis not present

## 2017-02-15 DIAGNOSIS — S299XXA Unspecified injury of thorax, initial encounter: Secondary | ICD-10-CM | POA: Diagnosis not present

## 2017-02-15 DIAGNOSIS — Z87891 Personal history of nicotine dependence: Secondary | ICD-10-CM | POA: Insufficient documentation

## 2017-02-15 DIAGNOSIS — Y929 Unspecified place or not applicable: Secondary | ICD-10-CM | POA: Insufficient documentation

## 2017-02-15 DIAGNOSIS — R079 Chest pain, unspecified: Secondary | ICD-10-CM | POA: Diagnosis not present

## 2017-02-15 MED ORDER — METHOCARBAMOL 500 MG PO TABS: 500.0000 mg | ORAL_TABLET | Freq: Two times a day (BID) | ORAL | 0 refills | Status: DC

## 2017-02-15 MED ORDER — METOCLOPRAMIDE HCL 10 MG PO TABS
5.0000 mg | ORAL_TABLET | Freq: Once | ORAL | Status: AC
  Administered 2017-02-15: 5 mg via ORAL
  Filled 2017-02-15: qty 1

## 2017-02-15 MED ORDER — KETOROLAC TROMETHAMINE 30 MG/ML IJ SOLN
30.0000 mg | Freq: Once | INTRAMUSCULAR | Status: AC
  Administered 2017-02-15: 30 mg via INTRAMUSCULAR
  Filled 2017-02-15: qty 1

## 2017-02-15 MED ORDER — DIPHENHYDRAMINE HCL 25 MG PO CAPS
25.0000 mg | ORAL_CAPSULE | Freq: Once | ORAL | Status: AC
  Administered 2017-02-15: 25 mg via ORAL
  Filled 2017-02-15: qty 1

## 2017-02-15 MED ORDER — ACETAMINOPHEN 325 MG PO TABS
650.0000 mg | ORAL_TABLET | Freq: Once | ORAL | Status: AC
  Administered 2017-02-15: 650 mg via ORAL
  Filled 2017-02-15: qty 2

## 2017-02-15 MED ORDER — ONDANSETRON 4 MG PO TBDP
4.0000 mg | ORAL_TABLET | Freq: Once | ORAL | Status: AC
  Administered 2017-02-15: 4 mg via ORAL
  Filled 2017-02-15: qty 1
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
## 2017-02-15 NOTE — Discharge Instructions (Signed)
Please read attached information. If you experience any new or worsening signs or symptoms please return to the emergency room for evaluation. Please follow-up with your primary care provider or specialist as discussed. Please use medication prescribed only as directed and discontinue taking if you have any concerning signs or symptoms.   °

## 2017-02-15 NOTE — ED Notes (Signed)
Patient transported to X-ray 
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
## 2017-02-23 ENCOUNTER — Ambulatory Visit (INDEPENDENT_AMBULATORY_CARE_PROVIDER_SITE_OTHER): Payer: PPO | Admitting: Internal Medicine

## 2017-02-23 ENCOUNTER — Ambulatory Visit (HOSPITAL_COMMUNITY)
Admission: RE | Admit: 2017-02-23 | Discharge: 2017-02-23 | Disposition: A | Discharge status: Home / Self Care | Payer: No Typology Code available for payment source | Source: Ambulatory Visit | Attending: Internal Medicine | Admitting: Internal Medicine

## 2017-02-23 ENCOUNTER — Encounter: Payer: Self-pay | Admitting: Internal Medicine

## 2017-02-23 DIAGNOSIS — R109 Unspecified abdominal pain: Secondary | ICD-10-CM

## 2017-02-23 DIAGNOSIS — M2550 Pain in unspecified joint: Secondary | ICD-10-CM | POA: Diagnosis not present

## 2017-02-23 DIAGNOSIS — R42 Dizziness and giddiness: Secondary | ICD-10-CM

## 2017-02-23 DIAGNOSIS — R51 Headache: Secondary | ICD-10-CM | POA: Diagnosis not present

## 2017-02-23 DIAGNOSIS — Z6841 Body Mass Index (BMI) 40.0 and over, adult: Secondary | ICD-10-CM | POA: Diagnosis not present

## 2017-02-23 DIAGNOSIS — E669 Obesity, unspecified: Secondary | ICD-10-CM | POA: Diagnosis not present

## 2017-02-23 DIAGNOSIS — G8911 Acute pain due to trauma: Secondary | ICD-10-CM | POA: Diagnosis not present

## 2017-02-23 DIAGNOSIS — R0789 Other chest pain: Secondary | ICD-10-CM

## 2017-02-23 MED ORDER — IBUPROFEN 800 MG PO TABS: 800.0000 mg | ORAL_TABLET | Freq: Three times a day (TID) | ORAL | 0 refills | Status: DC | PRN
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
## 2017-02-23 NOTE — Patient Instructions (Addendum)
Thank you for your visit today  Please do the CT scan of the head today- I will let you know of the results  Please take the ibuprofen 800 mg three times a day as needed  Please follow up in 2 weeks with your meter.
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
## 2017-02-27 NOTE — Progress Notes (Signed)
Patient ID: [name], female   DOB: 1961-05-25, 56 y.o.   MRN: [id number]  Case discussed with Dr. Tiburcio Pea at the time of the visit.  We reviewed the resident's history and exam and pertinent patient test results.  I agree with the assessment, diagnosis, and plan of care documented in the resident's note.

## 2017-02-28 ENCOUNTER — Other Ambulatory Visit: Payer: Self-pay | Admitting: Internal Medicine

## 2017-02-28 DIAGNOSIS — E1140 Type 2 diabetes mellitus with diabetic neuropathy, unspecified: Secondary | ICD-10-CM

## 2017-02-28 DIAGNOSIS — Z794 Long term (current) use of insulin: Principal | ICD-10-CM

## 2017-02-28 MED ORDER — INSULIN REGULAR HUMAN (CONC) 500 UNIT/ML ~~LOC~~ SOLN: SUBCUTANEOUS | 3 refills | Status: DC

## 2017-03-01 ENCOUNTER — Other Ambulatory Visit: Payer: Self-pay | Admitting: Internal Medicine

## 2017-03-07 ENCOUNTER — Telehealth: Payer: Self-pay | Admitting: Dietician

## 2017-03-07 DIAGNOSIS — S46012A Strain of muscle(s) and tendon(s) of the rotator cuff of left shoulder, initial encounter: Secondary | ICD-10-CM | POA: Diagnosis not present

## 2017-03-07 NOTE — Telephone Encounter (Signed)
Left message about new insulin meal time orders with Vgo and to find out if putting new Vgo on every 24 hours has helped lower blood sugars toward patients goal.

## 2017-03-09 ENCOUNTER — Encounter: Payer: Self-pay | Admitting: Internal Medicine

## 2017-03-09 ENCOUNTER — Ambulatory Visit (INDEPENDENT_AMBULATORY_CARE_PROVIDER_SITE_OTHER): Payer: PPO | Admitting: Internal Medicine

## 2017-03-09 VITALS — BP 142/69 | HR 90 | Temp 97.8°F | Ht 63.0 in | Wt 259.1 lb

## 2017-03-09 DIAGNOSIS — M25512 Pain in left shoulder: Secondary | ICD-10-CM | POA: Insufficient documentation

## 2017-03-09 DIAGNOSIS — E1140 Type 2 diabetes mellitus with diabetic neuropathy, unspecified: Secondary | ICD-10-CM | POA: Diagnosis not present

## 2017-03-09 DIAGNOSIS — G44209 Tension-type headache, unspecified, not intractable: Secondary | ICD-10-CM | POA: Diagnosis not present

## 2017-03-09 DIAGNOSIS — Z794 Long term (current) use of insulin: Secondary | ICD-10-CM | POA: Diagnosis not present

## 2017-03-09 DIAGNOSIS — Z1231 Encounter for screening mammogram for malignant neoplasm of breast: Secondary | ICD-10-CM | POA: Diagnosis not present

## 2017-03-09 DIAGNOSIS — Z23 Encounter for immunization: Secondary | ICD-10-CM

## 2017-03-09 DIAGNOSIS — Z113 Encounter for screening for infections with a predominantly sexual mode of transmission: Secondary | ICD-10-CM | POA: Diagnosis not present

## 2017-03-09 DIAGNOSIS — Z124 Encounter for screening for malignant neoplasm of cervix: Secondary | ICD-10-CM | POA: Diagnosis not present

## 2017-03-09 DIAGNOSIS — R32 Unspecified urinary incontinence: Secondary | ICD-10-CM | POA: Diagnosis not present

## 2017-03-09 DIAGNOSIS — N393 Stress incontinence (female) (male): Secondary | ICD-10-CM | POA: Insufficient documentation

## 2017-03-09 DIAGNOSIS — Z01411 Encounter for gynecological examination (general) (routine) with abnormal findings: Secondary | ICD-10-CM | POA: Diagnosis not present

## 2017-03-09 MED ORDER — CYCLOBENZAPRINE HCL 5 MG PO TABS: 5.0000 mg | ORAL_TABLET | Freq: Two times a day (BID) | ORAL | 0 refills | Status: DC

## 2017-03-09 NOTE — Assessment & Plan Note (Signed)
Patient with switch to Vgo from 70/30 mix insulin recently. She states that she has has polyuria and polydipsia; her CBG log reveals consistently elevated CBGs (180-300) in the AM with a couple of readings in 130s and PM CBGs of 130-300. Patient reports she has been using 1-2 clicks (7-40 units) of preprandial insulin with meals TID; per last note dose should have been 30 units TID. She also receives 100 units basal insulin daily.  Plan: --per discussion with [place], patient to continue 100 units basal insulin daily; and to use 30 units TID wc and 5 units with snacks. --f/u in 2 weeks with glucometer for further adjustment
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
## 2017-03-09 NOTE — Patient Instructions (Signed)
For your headaches, you can take flexeril 5mg  up to two times a day. Use a heating pad and stretch you neck three times a day.  I expect this will help with your leg pain as well.   Follow up in 2 weeks with your meter for further adjustment of your insulin.  Per Butch Penny, use 3 to 4 clicks with meals, and 1 click with snack. If you have low blood sugars, please give [place] a call and we will adjust your insulin.
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
## 2017-03-10 NOTE — Progress Notes (Signed)
Internal Medicine Clinic Attending  Case discussed with Dr. Svalina  at the time of the visit.  We reviewed the resident's history and exam and pertinent patient test results.  I agree with the assessment, diagnosis, and plan of care documented in the resident's note.  

## 2017-03-11 DIAGNOSIS — M25512 Pain in left shoulder: Secondary | ICD-10-CM | POA: Diagnosis not present

## 2017-03-13 DIAGNOSIS — S46012D Strain of muscle(s) and tendon(s) of the rotator cuff of left shoulder, subsequent encounter: Secondary | ICD-10-CM | POA: Diagnosis not present

## 2017-03-15 DIAGNOSIS — M25512 Pain in left shoulder: Secondary | ICD-10-CM | POA: Diagnosis not present

## 2017-03-15 DIAGNOSIS — S46012D Strain of muscle(s) and tendon(s) of the rotator cuff of left shoulder, subsequent encounter: Secondary | ICD-10-CM | POA: Diagnosis not present

## 2017-03-17 DIAGNOSIS — S46012D Strain of muscle(s) and tendon(s) of the rotator cuff of left shoulder, subsequent encounter: Secondary | ICD-10-CM | POA: Diagnosis not present

## 2017-03-17 DIAGNOSIS — M25512 Pain in left shoulder: Secondary | ICD-10-CM | POA: Diagnosis not present

## 2017-03-21 DIAGNOSIS — M25512 Pain in left shoulder: Secondary | ICD-10-CM | POA: Diagnosis not present

## 2017-03-21 DIAGNOSIS — S46012D Strain of muscle(s) and tendon(s) of the rotator cuff of left shoulder, subsequent encounter: Secondary | ICD-10-CM | POA: Diagnosis not present

## 2017-03-25 ENCOUNTER — Other Ambulatory Visit: Payer: Self-pay | Admitting: Internal Medicine

## 2017-03-25 DIAGNOSIS — E1140 Type 2 diabetes mellitus with diabetic neuropathy, unspecified: Secondary | ICD-10-CM

## 2017-03-25 DIAGNOSIS — Z794 Long term (current) use of insulin: Principal | ICD-10-CM

## 2017-03-27 ENCOUNTER — Ambulatory Visit (INDEPENDENT_AMBULATORY_CARE_PROVIDER_SITE_OTHER): Payer: PPO | Admitting: Dietician

## 2017-03-27 ENCOUNTER — Ambulatory Visit (INDEPENDENT_AMBULATORY_CARE_PROVIDER_SITE_OTHER): Payer: PPO | Admitting: Internal Medicine

## 2017-03-27 ENCOUNTER — Encounter: Payer: Self-pay | Admitting: Internal Medicine

## 2017-03-27 VITALS — BP 134/82 | HR 86 | Temp 98.4°F | Ht 63.0 in | Wt 257.7 lb

## 2017-03-27 DIAGNOSIS — Z794 Long term (current) use of insulin: Secondary | ICD-10-CM

## 2017-03-27 DIAGNOSIS — I10 Essential (primary) hypertension: Secondary | ICD-10-CM

## 2017-03-27 DIAGNOSIS — K219 Gastro-esophageal reflux disease without esophagitis: Secondary | ICD-10-CM | POA: Diagnosis not present

## 2017-03-27 DIAGNOSIS — E1140 Type 2 diabetes mellitus with diabetic neuropathy, unspecified: Secondary | ICD-10-CM

## 2017-03-27 DIAGNOSIS — M545 Low back pain: Secondary | ICD-10-CM | POA: Diagnosis not present

## 2017-03-27 DIAGNOSIS — G44209 Tension-type headache, unspecified, not intractable: Secondary | ICD-10-CM

## 2017-03-27 DIAGNOSIS — E785 Hyperlipidemia, unspecified: Secondary | ICD-10-CM | POA: Diagnosis not present

## 2017-03-27 DIAGNOSIS — S46012D Strain of muscle(s) and tendon(s) of the rotator cuff of left shoulder, subsequent encounter: Secondary | ICD-10-CM | POA: Diagnosis not present

## 2017-03-27 DIAGNOSIS — E119 Type 2 diabetes mellitus without complications: Secondary | ICD-10-CM

## 2017-03-27 DIAGNOSIS — G8929 Other chronic pain: Secondary | ICD-10-CM | POA: Diagnosis not present

## 2017-03-27 DIAGNOSIS — M25512 Pain in left shoulder: Secondary | ICD-10-CM | POA: Diagnosis not present

## 2017-03-27 DIAGNOSIS — Z713 Dietary counseling and surveillance: Secondary | ICD-10-CM

## 2017-03-27 LAB — POCT GLYCOSYLATED HEMOGLOBIN (HGB A1C): HEMOGLOBIN A1C: 8.5

## 2017-03-27 LAB — GLUCOSE, CAPILLARY: Glucose-Capillary: 178 mg/dL — ABNORMAL HIGH (ref 65–99)

## 2017-03-27 MED ORDER — PANTOPRAZOLE SODIUM 40 MG PO TBEC: 40.0000 mg | DELAYED_RELEASE_TABLET | Freq: Two times a day (BID) | ORAL | 0 refills | Status: DC

## 2017-03-27 MED ORDER — V-GO 30 KIT: 1.0000 | PACK | Freq: Every day | 0 refills | Status: DC

## 2017-03-27 MED ORDER — ROSUVASTATIN CALCIUM 20 MG PO TABS: 20.0000 mg | ORAL_TABLET | Freq: Every day | ORAL | 0 refills | Status: DC

## 2017-03-27 MED ORDER — GLUCOSE BLOOD VI STRP: ORAL_STRIP | 5 refills | Status: DC

## 2017-03-27 MED ORDER — GABAPENTIN 300 MG PO CAPS: 900.0000 mg | ORAL_CAPSULE | Freq: Every day | ORAL | 0 refills | Status: DC

## 2017-03-27 MED ORDER — OXYCODONE-ACETAMINOPHEN 5-325 MG PO TABS: 1.0000 | ORAL_TABLET | Freq: Three times a day (TID) | ORAL | 0 refills | Status: DC | PRN

## 2017-03-27 MED ORDER — SUCRALFATE 1 G PO TABS: 1.0000 g | ORAL_TABLET | Freq: Three times a day (TID) | ORAL | 0 refills | Status: DC

## 2017-03-27 MED ORDER — V-GO 20 KIT: PACK | 5 refills | Status: DC

## 2017-03-27 MED ORDER — HYDROCHLOROTHIAZIDE 25 MG PO TABS: 25.0000 mg | ORAL_TABLET | Freq: Every day | ORAL | 0 refills | Status: DC

## 2017-03-27 NOTE — Patient Instructions (Addendum)
It was a pleasure to see you today [name]. During your visit we made the following changes:  1) Increased your insulin to VGO 30 -Per Butch Penny, use 3 to 4 clicks with meals, and 1 click with snack. If you have low blood sugars, please give [place] a call and we will adjust your insulin. -Continue to diet and exercise. Goal weight for 1 month: 254lbs 2) Start taking sucrulfate as needed when you have abdominal pain 3) Headache-please continue using flexeril, heating pad, and doing neck exercises   4) Back pain medication prescribed for 1 month 5) Follow up in 1 month

## 2017-03-27 NOTE — Progress Notes (Signed)
Internal Medicine Clinic Attending  I saw and evaluated the patient.  I personally confirmed the key portions of the history and exam documented by Dr. Chundi and I reviewed pertinent patient test results.  The assessment, diagnosis, and plan were formulated together and I agree with the documentation in the resident's note. 

## 2017-03-27 NOTE — Assessment & Plan Note (Addendum)
Patient states that she has been having abdominal pain for several months now. The pain is worsened after she eats a fatty meal. She currently takes protonix 40mg  bid    -Encouraged to continue protonix 40mg  bid  -Stated that she can use miralax or tums as needed -Added sucralfate 1g tid prn with meals

## 2017-03-27 NOTE — Assessment & Plan Note (Signed)
The patient's last hba1c was 8.4 in June 2018. The patient's a1c during this visit was 8.5 and random glucose was 178. Her home blood glucose measurements have ranged 70-130 before meals and 90-180 after meals. She has had no hypoglycemic events per her glucose readings.   The patient states that she has been having polyurea, polydipsia, and night sweats recently. Denies dizziness, nausea/vomiting. The patient is currently taking VGO 20u, humulin, januvia 100mg  qd, metformin 1000mg  bid, and liraglutide 1.8mg .  The patient has been trying to loose weight by increasing walking. She was previously eating 259.1lbs in 03/09/17 and today she is 257.7lbs.   Since there is no benefit in using both [place] and liraglutide. -Discontinued januvia -Plan to increase liraglutide from 1.8mg  to 3mg  with Dr. Julianne Rice assistance with pricing  -The patient's basal insulin dosing was also further increased to 30u

## 2017-03-27 NOTE — Assessment & Plan Note (Signed)
Refilled percocet 5-325 q8hrs prn, 50 tablets for chronic back pain. Please get her to sign pain contract again at next visit as last pain contract in 2012 and consider getting uds.

## 2017-03-27 NOTE — Progress Notes (Signed)
   CC: Diabetes follow-up  HPI:  Ms.Marcell Iovine is a 56 y.o. with pmh of type 2 diabetes mellitus, copd, and essential hypertension who presents for diabetes follow up.    Past Medical History:  Diagnosis Date  . Allergy   . Arthritis    back   . Asthma    AS CHILD  . Chronic back pain   . Chronic leg pain    due to back pain  . Cocaine abuse    in remission  . Diabetes ([place])    with neuropathy  . GERD (gastroesophageal reflux disease)   . Hyperlipidemia   . Hypertension   . Neuromuscular disorder ([facility])    neuropathy  . RECTAL BLEEDING 12/09/2008   Annotation: s/p EGD 7/08 mild gastritis, s/p colonoscopy 7/08- benign polyp  s/p polypectomy and isolated diverticulum.  Qualifier: Diagnosis of  By: Ditzler RN, Debra    . Sleep apnea    CPAP    YEARS AGO DONE 1/2 YEARS AGO AND WAS TOLD DID NOT HAVE  . Tobacco abuse   . Uterine fibroid    s/p hysterectomy   Review of Systems:    Review of Systems  Eyes: Positive for blurred vision.  Cardiovascular: Negative for chest pain and palpitations.  Gastrointestinal: Positive for heartburn. Negative for constipation, diarrhea, nausea and vomiting.  Genitourinary: Negative for dysuria and urgency.  Neurological: Positive for headaches. Negative for dizziness, tingling, sensory change, focal weakness and weakness.   Physical Exam:  There were no vitals filed for this visit. Physical Exam  Constitutional: She appears well-developed and well-nourished. No distress.  HENT:  Head: Normocephalic and atraumatic.  Eyes: Conjunctivae are normal.  Cardiovascular: Normal rate, regular rhythm and normal heart sounds.   Pulmonary/Chest: Effort normal and breath sounds normal. No respiratory distress. She has no wheezes.  Abdominal: Soft. Bowel sounds are normal. She exhibits no distension. There is tenderness (predominantly in epigastric, ruq).  Neurological: No cranial nerve deficit or sensory deficit.  Psychiatric: She has a normal mood  and affect. Her behavior is normal. Judgment and thought content normal.    Assessment & Plan:   See Encounters Tab for problem based charting.  Patient seen with Dr. Daryll Drown

## 2017-03-27 NOTE — Progress Notes (Signed)
  Medical Nutrition Therapy:  Appt start time: 1100 end time:  1130. Visit # 4,  last visit for MNT was 2016  Assessment:  Primary concerns today: weight loss and blood sugar control.  "I know what to do, I have a hard time doing it". When asked about her feeling about weight loss surgery " I don't want surgery. " when asked what she thinks she needs to to to achieve weight loss, she responds" stop eating out." Anastasiya reports she wants to decrease her weight and thought keeping a food record was helpful. She has supportive friends and coworkers who encourage her to make healthy choices, she has a Building services engineer and a neighbor hood conducive to walking and with many walkers. Her weight is ~ 4# less than it was in 2016, no noted trend recently. Visit was cut short by phone call from her work.   Preferred Learning Style: No preference indicated  Learning Readiness: Contemplating  ANTHROPOMETRICS: weight-257#, BMI-45.65 WEIGHT HISTORY: lost about 10-20 pounds two times in past 10 years. Highest: 260s Lowest-237# SLEEP:need to assess at future visit  MEDICATIONS: liraglutide 1.'8mg'$  daily, metformin, ~240 units of insulin daily per her report BLOOD SUGAR: no change despite VGo DIETARY INTAKE: Usual eating pattern includes 2-3 meals and 2 snacks per day. Everyday foods include fried and starchy foods .  Avoided foods include none.  Food Intolerances: none, Constipation: need to assess at future visit Any hair loss: no  Dining Out (times/week): several times a week to daily 24-hr recall:  B ( 11 AM): [employer] - 2 pancvakes, 2 sausage, hash browns, grits or [place]- bacon egg and cheese biscuit, sometimes a blueberry muffin, coffee Snk ( PM): kit kat candy bar D ( 5-8 PM): 2 slices' tony's frozen supreme pizza, 4 hot wings with ranch, cukes with vinegar, 1/4 reg orange soda Snk ( PM): honey bun Beverages: water, coffee with non-nutritive sweetener, sweet tea, regular soda  Usual physical activity:  adls and sedentary work  Estimated daily energy needs for weight loss 1800-2000 calories Set goal of 55 grams of fat/day to assist with lowering blood sugars and calorie intake  Progress Towards Goal(s):  No progress.   Nutritional Diagnosis:  NB-1.3 Not ready for diet/lifestyle change  As related to unknown barriers to change.  As evidenced by her hesitancy to make changes or stay with them.    Intervention:  Nutrition education and counseling about behavior change, aligning with support systems to assist with success. Coordination of care: consider repeat CGM especially changing VGO and she wants to change diet and activity. Teaching Method Utilized: Visual,Auditory, Hands on Handouts given during visit include: [id number] sample Barriers to learning/adherence to lifestyle change: time, resources Demonstrated degree of understanding via:  Teach Back   Monitoring/Evaluation:  Dietary intake, exercise, meter, and body weight 4 weeks per patient preference Plyler, Butch Penny, [place] 03/27/2017 3:37 PM. .
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
## 2017-03-27 NOTE — Assessment & Plan Note (Signed)
The patient's blood pressure during this visit 134/82. The patient is currently taking hctz 25mg . No dizziness, chest pain, palpitations.  -Refilled hctz 25mg  daily

## 2017-03-28 MED ORDER — CONTINUOUS GLUCOSE MONITOR DEVI: 0 refills | Status: AC

## 2017-03-28 NOTE — Addendum Note (Signed)
Addended by: Shela Leff on: 03/28/2017 10:51 AM   Modules accepted: Orders

## 2017-03-28 NOTE — Progress Notes (Signed)
Order for CGM has been placed. Thanks.

## 2017-03-29 DIAGNOSIS — M25512 Pain in left shoulder: Secondary | ICD-10-CM | POA: Diagnosis not present

## 2017-03-29 DIAGNOSIS — S46012D Strain of muscle(s) and tendon(s) of the rotator cuff of left shoulder, subsequent encounter: Secondary | ICD-10-CM | POA: Diagnosis not present

## 2017-04-03 DIAGNOSIS — S46012D Strain of muscle(s) and tendon(s) of the rotator cuff of left shoulder, subsequent encounter: Secondary | ICD-10-CM | POA: Diagnosis not present

## 2017-04-03 DIAGNOSIS — M25512 Pain in left shoulder: Secondary | ICD-10-CM | POA: Diagnosis not present

## 2017-04-05 DIAGNOSIS — S46012D Strain of muscle(s) and tendon(s) of the rotator cuff of left shoulder, subsequent encounter: Secondary | ICD-10-CM | POA: Diagnosis not present

## 2017-04-05 DIAGNOSIS — M25512 Pain in left shoulder: Secondary | ICD-10-CM | POA: Diagnosis not present

## 2017-04-07 ENCOUNTER — Telehealth: Payer: Self-pay | Admitting: Dietician

## 2017-04-07 ENCOUNTER — Encounter: Payer: Self-pay | Admitting: Dietician

## 2017-04-07 DIAGNOSIS — Z794 Long term (current) use of insulin: Principal | ICD-10-CM

## 2017-04-07 DIAGNOSIS — E1140 Type 2 diabetes mellitus with diabetic neuropathy, unspecified: Secondary | ICD-10-CM

## 2017-04-07 NOTE — Telephone Encounter (Signed)
Called [facility] to find out exactly what she needs:

## 2017-04-07 NOTE — Telephone Encounter (Signed)
Wynelle says she got half of [facility] and needs the other half ordered. It didn't cost her anyhting! She'll call for education if she needs it.   Regarding the bill she got for her dsmt on date of service 12/23/16 and 01/25/2017. [facility] says it needs to be submitted with the hospital NPI and not mine. Plan to contact billing and have this resubmitted.

## 2017-04-11 MED ORDER — FREESTYLE LIBRE SENSOR SYSTEM MISC
1.0000 | Freq: Every day | 12 refills | Status: DC
Start: 2017-04-11 — End: 2017-05-01

## 2017-04-12 ENCOUNTER — Ambulatory Visit (INDEPENDENT_AMBULATORY_CARE_PROVIDER_SITE_OTHER): Payer: PPO | Admitting: Internal Medicine

## 2017-04-12 ENCOUNTER — Encounter: Payer: Self-pay | Admitting: Internal Medicine

## 2017-04-12 ENCOUNTER — Ambulatory Visit: Payer: PPO | Admitting: Internal Medicine

## 2017-04-12 ENCOUNTER — Other Ambulatory Visit: Payer: Self-pay | Admitting: Internal Medicine

## 2017-04-12 VITALS — BP 122/70 | HR 87 | Ht 63.0 in | Wt 260.2 lb

## 2017-04-12 DIAGNOSIS — J387 Other diseases of larynx: Secondary | ICD-10-CM | POA: Diagnosis not present

## 2017-04-12 DIAGNOSIS — R053 Chronic cough: Secondary | ICD-10-CM

## 2017-04-12 DIAGNOSIS — J449 Chronic obstructive pulmonary disease, unspecified: Secondary | ICD-10-CM | POA: Diagnosis not present

## 2017-04-12 DIAGNOSIS — R05 Cough: Secondary | ICD-10-CM | POA: Diagnosis not present

## 2017-04-12 LAB — POCT EXHALED NITRIC OXIDE: FeNO level (ppb): 16

## 2017-04-12 MED ORDER — BUDESONIDE-FORMOTEROL FUMARATE 80-4.5 MCG/ACT IN AERO: 2.0000 | INHALATION_SPRAY | Freq: Two times a day (BID) | RESPIRATORY_TRACT | 5 refills | Status: DC

## 2017-04-12 NOTE — Progress Notes (Signed)
Subjective:     Patient ID: [name], female   DOB: [birth year], 56 y.o.   MRN: [id number]  HPI  '@Patient'$  ID: [name], female    DOB: 1961/03/28, 56 y.o.   MRN: [id number]  Chief Complaint  Patient presents with  . Follow-up    cough     Referring provider: Shela Leff, MD  HPI:   HPI   IOV 02/25/2013  56 year old nonsmoker obese female. Accompanied by her husband. Chief complaint is chronic cough.  Cough is of insidious onset a year ago. Was severe in intensity. And was progressive. Quality was a dry cough. There is associated ticklish sensation in her throat and constant clearing and gagging. In April 2014 was diagnosed to have esophageal dysmotility and in July 2014 saw Dr. Verdia Kuba who adjusted her proton pump inhibitor and change her diet. After this in the last several weeks her cough significantly improved and almost resolved. In between he esophagogram in seeing gastroenterologist Dr. Collene Mares she did see Dr. Melony Overly of ENT specialty who apparently did not see any abnormal with her vocal cords but treated her for sinusitis and this did not help her cough.   Cough relevant history   - Sinus/allergies  - Denies any problems. Never on nasal steroids. But does have some postnasal drip  - GI/reflux disease  - July 2008 had endoscopy which showed mild gastritis. She is on proton pump inhibitor  - Body mass index is 42.99 kg/(m^2).  - esophagogram Aug 2014: IMPRESSION:  Mild esophageal dysmotility.  Otherwise negative esophagram.  Original Report Authenticated By: Julian Hy, M.D.   - SAw DR Collene Mares July 2014 and PPI adjusted and Diet changed-> after this cough resolved   - Hypertension  -  She has hypertension but she is not on ACE inhibitor  - Pulmonary history  -  reports that she quit smoking about 14 years ago. Her smoking use included Cigarettes. She has a 10 pack-year smoking history. She has never used smokeless tobacco.   - CT chest  2006    -   Findings: No dissection is evident. No filling defect is identified in the pulmonary arterial tree to suggest pulmonary embolus. In the lateral basal segment of the right lower lobe there is some atelectasis adjacent to a small region of nodularity measuring 6 mm in diameter. This nodularity likely simply relates to atelectasis, but a true pulmonary nodule is difficult to exclude. I would recommend follow up limited noncontrast CT in 3 months time in order to reassess this region.  There is no hilar or mediastinal adenopathy.  IMPRESSION:  No embolus. Mild nodularity associated with right lower lobe subsegmental atx; recommend followup limited evaluation in 3 months in order to ensure that this clears or fails to progress. The main purpose of the followup is to rule out the statistically unlikely possibility that this represents early malignancy.    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  #Sinus  - continue take generic fluticasone inhaler 2 squirts each nostril daily - START 3% nasal saline spray at night 2 squietrs made by a company called [employer]  #ACid reflux  - most important reason for your cough  - folllow advice of Dr Collene Mares 100% without fail  #Irritable larynx  -2- 3 days of complete voice rest without whispering or talking - See MR CArl Schinke of  neuro rehab for speech therapy  #followup  - 4-6 weeks with me or my CMA [name] with cough score at followupo - If cough still a problem, will consider CT chest or neck and methacholine challenge test and Rx with neurontin   04/23/2013 Follow up Cough  6 week follow up - reports cough improves for a few days, then worsens again.Marland Kitchen Cough seems to wax and wane .  Not using anything to control cough .  Has sinus drip and drainage esp at night.  No fever, discolored mucus, chest pain, orthopnea or edema.  CXR 01/2013 with  no acute findings.  [name] Cough score 23 today   Begin Delsym 2 tsp Twice daily   Begin Tessalon 240m Three times a day   Begin Chlortrimeton 450m1 in am and 2 At bedtime  -may make you sleepy.  Continue on Protonix daily before meal  Continue on Zantac At bedtime   Work on not coughing or throat clearing.  Please contact office for sooner follow up if symptoms do not improve or worsen or seek emergency care  follow up Dr. RaChase Callern 4-6 weeks and As needed  OV 05/21/2013 Chief Complaint  Patient presents with  . Cough    follow-up. Pt states cough is not improved at all.   [age] female reports for followup of cough that is chronic. Since seeing me in September 2014 on the cough score her cough appears one third better but subjectively she says that her cough is unimproved. She reports compliance with a sinus treatment measures and acid reflux treatment measures. Despite attending speech therapy and following my advice and the advice of nurse practitioner a month ago cough is unimproved. She is frustrated. RSI cough score is 25 and details are below. She is open to more advanced testing    05/31/2013 Follow up  Returns for follow up and review test results . Seen last week with cough . No significant change in cough.   Patient underwent a pulmonary function test on December 12 that showed moderate airflow obstruction with FEV1 is 1.35 L/59%. Ratio 62. No sign change BD   Patient underwent CT chest on December 9 that showed mild bronchiectasis in the right middle lobe. No evidence of interstitial lung disease. There was an incidental nodule in the right lung base, measuring 7 mm.  CT sinus showed no acute sinus disease  She denies any hemoptysis, orthopnea, PND, or leg swelling.  REC Trial of Breo Inhaler 1 puff daily , brush/rinse and gargle after use.  Continue on Delsym 2 tsp Twice daily  For cough As needed   Continue on Tessalon 200m30mree times a day  As needed   Cough .  Continue use Chlortrimeton 4mg 38mn am and 2 At bedtime  -may make you sleepy.  Continue on Protonix daily before meal  Continue on Zantac At bedtime   Work on not coughing or throat clearing- use sips of water, sugarless candy. NO mints .  Please contact office for sooner follow up if symptoms do not improve or worsen or seek emergency care  follow up Dr. RamasChase Caller-6 weeks and As needed   OV 07/26/2013  Chief Complaint  Patient presents with  . Cough    follow-up. Pt states cough is same as last visti. no better.    Followup chronic cough  She says she is no better. For sinus she's on nasal spray, Flonase, last visit started on Chlor-Trimeton by my nurse practitioner.  For acid reflux she is on Protonix and Zantac. 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 RSI cough score is 21. 4 obstructive lung disease she is on Brio but this also has not helped. She's willing to try Neurontin she's been to speech therapy in the past. Neurontin made her gain weight and one of her chronic pain medications and she stopped it but has not had any adverse effects. I explained to her that Neurontin can be helpful and medications with irritable larynx or chronic cough or cyclical cough but can cause significant side effects of grogginess, fogginess, sleepiness and weight gain. She understands the side effects and is willing to give her short term trial of this  REportes good compliance but I doubt   REC Continue sinus, gerd, and inhaler therpay  - change breo to symbicort 2 puff bid  Take gabapentin 333m once daily x 5 days, then 3039mtwice daily x 5 days, then 3008mhree times daily to continue. If this makes you too sleepy or drowsy call us [facility] we will cut your medication dosing down   Return in 4 - 8 weeks   - cough score at followup   OV 09/06/2013  Chief Complaint  Patient presents with  . Cough    Reports cough has improved since last OV--Has  days it will try and come back     FU chronic cough:    She tells me she is "some" better but in reality the RSI cough score shows persistence of severe chronic cough. In terms of sinus control: She is only doing her nasal saline drops as needed. She's not doing nasal steroids and she is unclear why. In terms of obstructive lung disease: She says that she is compliant with his Symbicort although she did not know the name initially. In terms of acid reflux: She says she's compliant with the proton pump and better. In terms of irritable larynx: I advised her to start gabapentin at last visit but she does be that she showed up at the pharmacy and the pharmacist told her that in his years of experience he never saw gabapentin being prescribed for chronic cough so she decided not to treat herself with gabapentin. I asked her why she can call here and she had no answer.  I've advised her 100% compliance with treatment measures if she needs to improve. Because of her on the side effects of gabapentin extensively and after listening to this and the potential benefits based on lancet paper she is willing to give this a try   COugh  - not improved - important you follow instructions carefully, closely and 100% of the time   -  Sinus   -  take generic fluticasone inhaler 2 squirts each nostril daily  - continue nasal saline spray - GERD  : continue medications for this  - Airflow obstruction  :  symbicort 2 puff twice daily  - Irritable larynx  : START e gabapentin 300m77mce daily x 5 days, then 300mg47mce daily x 5 days, then 300mg 20me times daily to continue. If this makes you too sleepy or drowsy call us andK[facility]ae will cut your medication dosing down   Return in 4 - 8 weeks   - cough score at followup  OV .10/16/2013  Chief Complaint  Patient presents with  . Cough    follow up.  cough is 80% improved since last, but feels the Gabapentin has increased  her appetite.   Followup chronic  cough. At last visit after extensive conversation about compliance and the need to start gabapentin she did start gabapentin and she's been compliant with sinus, acid reflux and Symbicort inhaler. With these measures the cough is 80% better. Objectively as well RSI cough score is reduced at 13. However, she is reporting increased weight gain by 4 pounds and also increased appetite since starting gabapentin. Review of gabapentin side effects reveal that the incidence of this is between 1 and 3% only but she is convinced that the drug is  to blame. She does admit that gabapentin has helped her cough. Therefore she is requesting for a taper in the gabapentin dose as a trial.  Otherwise no problems. Of note, she has a 7 mm lung nodule in December 2014 and neck CT scan of the chest i needs to be done in summer 2015  REC COugh  - much better after starting gabapentin but you are having side effects. So, will make a revised plan in [place]OLD   -  Sinus   -  continue generic fluticasone inhaler 2 squirts each nostril daily  - continue nasal saline spray - GERD  : continue medications for this  - Airflow obstruction  Continue symbicort 2 puff twice daily  - Irritable larynx  : Continue gabapentin but reduce to 357m once a NIGHT at bedtime (instea of 3  times a day)    Return  - in July/Augt 2015  - cough part 1 score at followup  - CT scan chest for lung nodule to be done prior to followup  OV 02/13/2014  Chief Complaint  Patient presents with  . Follow-up    Pt here to review CT results. Pt states her breathing is doing well. Pt states she only gets SOB when climbing steps. Pt c/o mild cough with intermittent mucous production in morning, brown in color. Pt denies CP.     Followup chronic cough.  And lung nodule  Chronic cough: Now on gabapentin low dose qhs, ompliant with sinus, acid reflux and Symbicort inhaler. With these measures the cough is better. Objectively as well RSI cough score is  reduced at 13 but same as before. Ideally would like to get rid of it but accepts this is best possible.   OBesity: Body mass index is 44.61 kg/(m^2). CT July 2015 has fatty  Liver and I warned about rfuture cirrhosis risk. Weight  continues and is worsening. Advised to talk to MDrucilla Schmidt MD and advised low glycemic diet sheet. Can help with ge reflyux that can help cough  Nodule:had CT July 2015 chest: nodule stable since 2006.  No furhter CT needed   OV  01/06/2017  Chief Complaint  Patient presents with  . Follow-up    Pt last in 02/2014 for cough and lung nodule. Pt states she comes back today for a dry cough that has recently worsened. Pt denies chest congestion, f/c/s, CP/tightness. Pt states she has occ DOE.    Follow-up chronic cough  She returns after nearly 3 year hiatus. She says in the interim the cough actually improved further though not fully resolved but in the last few months the cough is worse associated with mild chronic sinus headache. She says in the interim sometime in the past she did see ENT and was reassured her vocal cords were normal. She continues to have chronic clearing of the throat. Current RSI cough score is 20 with major weight is coming from clearing  of the throat and a sensation of something sticking in her throat. She continues to be on Symbicort and gabapentin 600 mg at night but these are not helping.  Last CT sinus showed mild sinusitis in 2014. Last CT chest high resolution was in 2015 without any evidence of interstitial lung disease. Last pulmonary function test was in 2014 that showed obstruction   56 yo female former smoker seen for pulmonary consult 2014 for chronic cough. (~1 yr )   TEST Joya San              - July 2008 had endoscopy which showed mild gastritis. She is on proton pump inhibitor                        - esophagogram Aug 2014: IMPRESSION: Mild esophageal dysmotility.  CT chest 2006 -No embolus. Mild nodularity associated with  right lower lobe subsegmental atx;  05/2013 >Patient underwent a pulmonary function test on December 12 that showed moderate airflow obstruction with FEV1 is 1.35 L/59%. Ratio 62. No sign change BD  05/2013 CT sinus showed no acute sinus disease  01/31/2017 Follow up : Chronic cough  Patient presents for a one-month follow-up. Patient has a history of chronic cough and was treated in 2014 2015 for persistent cough. She was noted to have moderate airflow obstruction on PFT. She was treated with gabapentin for cyclical cough. Patient was not seen in the office for around 3 years. And return last month for increased cough. She was set up for a CT sinus that was clear. High resolution CT chest showed no evidence of interstitial lung disease. Stable nodules since 2014 considered with benign etiology.  CT did note hepatic steatosis. Discussed with patient. She will need to follow with her primary care physician to discuss this. If any further or additional testing is indicated.Marland Kitchen PFT today shows similar moderate airflow obstruction  FEV1 62%,ratio 65 FVC 75% , no sign BD , DLCO 75% Patient says that she is using Tessalon was some improvement in her cough. She continues to be on gabapentin at bedtime. She does have Symbicort but does not take it on a regular basis. She denies any hemoptysis, chest pain, orthopnea, PND, or increased leg swelling. Cough is probably dry in nature.   OV 04/12/2017  Chief Complaint  Patient presents with  . Follow-up    Pt states that she is still coughing some, but it has eased up and mild SOB. Denies any CP.   Follow-up chronic refractory cough -  Unexplained Obstructive lung disease   Last visit August 2018 she summoned nurse practitioner. High-resolution CT chest and sinus CT were clear. She was restarted on her Symbicort. Today she tells me that cough is better. However she is unable to quantify anything further. She is a poor historian. But she did indicate multiple  times that the cough is mild enough that it is not impacting her quality of life any more.I did have her do the RSI cough score and it showed a value of 11 which is an improvement from past documented below.  She continues on gabapentin at night for many years. Infectious forgotten what this medication is for. She thinks it is not impacting her cough on review of the other. She is ready to try weaning off the gabapentin  Dr Lorenza Cambridge Reflux Symptom Index (> 13-15 suggestive of LPR cough) 02/25/2013 Score for past 2 months 05/21/2013 S/p sinus, gerd and speech Rx 07/26/2013 After  starting breo in additioin to abpove 09/06/2013 ssaline sinus, no nasal steroid, symbiRx, ppi+ but no neurontin 10/16/2013 After starting gabapentin  02/13/2014  01/06/2017 pon gabapentin '600mg'$  qhs 04/12/2017 3  Hoarseness of problem with voice '3 3 2 2 3 3 2 3  '$ Clearing  Of Throat '5 3 3 4 3 3 3 2  '$ Excess throat mucus or feeling of post nasal drip '3 4 4 5 2 2 1 '$ 01  Difficulty swallowing food, liquid or tablets 3 1 0 0 0 0 0 0  Cough after eating or lying down '3 4 5 5 1 1 4 1  '$ Breathing difficulties or choking episodes '1 1 1 '$ 0 0 0 0 0  Troublesome or annoying cough '5 4 4 5 1 1 5 '$ 0  Sensation of something sticking in throat or lump in throat '5 3 4 '$ 4.5 1 0 5 2  Heartburn, chest pain, indigestion, or stomach acid coming up '5 3 1 5 2 3 '$ 0 4  TOTAL 33 25 21 30.'5 13 13 20 11    '$ FeNO 16 ppb 04/12/2017    has a past medical history of Allergy; Arthritis; Asthma; Chronic back pain; Chronic leg pain; Cocaine abuse ([place]); Diabetes ([place]); GERD (gastroesophageal reflux disease); Hyperlipidemia; Hypertension; Neuromuscular disorder ([place]); RECTAL BLEEDING (12/09/2008); Sleep apnea; Tobacco abuse; and Uterine fibroid.   reports that she quit smoking about 18 years ago. Her smoking use included Cigarettes. She has a 10.00 pack-year smoking history. She has never used smokeless tobacco.  Past Surgical History:  Procedure Laterality Date  .  BACK SURGERY  2012   L5-S1 microendoscopic disectomy last surgery 06/2011  . BREAST BIOPSY     LEFT    01/19/16  . BREAST REDUCTION SURGERY  1982  . COLONOSCOPY    . HAND SURGERY     MIDDLE TRIGGER FINGER RIGHT SIDE  . RADIOACTIVE SEED GUIDED EXCISIONAL BREAST BIOPSY Left 02/18/2016   Procedure: LEFT RADIOACTIVE SEED GUIDED EXCISIONAL BREAST BIOPSY;  Surgeon: Alphonsa Overall, MD;  Location: [facility];  Service: General;  Laterality: Left;  . TONSILLECTOMY  2008  . TOTAL ABDOMINAL HYSTERECTOMY  05/24/2007   hysterectomy  . TUBAL LIGATION    . UPPER GASTROINTESTINAL ENDOSCOPY      Allergies  Allergen Reactions  . Losartan Cough    Immunization History  Administered Date(s) Administered  . Influenza Split 03/11/2011, 03/02/2012  . Influenza Whole 02/12/2010  . Influenza,inj,Quad PF,6+ Mos 03/08/2013, 02/27/2014, 05/19/2015, 04/19/2016, 03/09/2017  . PPD Test 10/04/2010, 07/11/2011, 12/05/2012  . Pneumococcal Polysaccharide-23 01/13/2012  . Tdap 11/18/2010    Family History  Problem Relation Age of Onset  . Diabetes Father   . Heart disease Father   . Hypertension Father   . Diabetes Mother   . Cancer Mother        brain  . Hypertension Mother   . Kidney disease Sister   . Diabetes Sister   . Kidney cancer Sister   . Colon cancer Neg Hx   . Colon polyps Neg Hx   . Esophageal cancer Neg Hx   . Rectal cancer Neg Hx   . Stomach cancer Neg Hx      Current Outpatient Prescriptions:  .  acetaminophen (TYLENOL) 500 MG tablet, Take 2 tablets (1,000 mg total) by mouth every 8 (eight) hours as needed. DO NOT take more than 6 tablets per day., Disp: 30 tablet, Rfl: 0 .  acyclovir (ZOVIRAX) 400 MG tablet, Take 1 tablet (400 mg total) by mouth 3 (three)  times daily as needed (flares)., Disp: 90 tablet, Rfl: 3 .  albuterol (PROAIR HFA) 108 (90 Base) MCG/ACT inhaler, Inhale 2 puffs into the lungs every 4 (four) hours as needed for wheezing or shortness of breath., Disp: 3 Inhaler, Rfl: 2 .   aspirin 81 MG EC tablet, Take 81 mg by mouth daily.  , Disp: , Rfl:  .  Blood Glucose Monitoring Suppl (ONETOUCH VERIO FLEX SYSTEM) w/Device KIT, 1 each by Does not apply route 3 (three) times daily., Disp: 1 kit, Rfl: 1 .  budesonide-formoterol (SYMBICORT) 80-4.5 MCG/ACT inhaler, Inhale 2 puffs into the lungs 2 (two) times daily as needed (shortness of breath)., Disp: 1 Inhaler, Rfl: 5 .  CALCIUM PO, Take 1 tablet by mouth every evening., Disp: , Rfl:  .  Cetirizine HCl (ZYRTEC ALLERGY) 10 MG CAPS, Take 1 capsule by mouth daily for allergies., Disp: 30 capsule, Rfl: 0 .  Continuous Blood Gluc Sensor (FREESTYLE LIBRE SENSOR SYSTEM) MISC, 1 each by Does not apply route 6 (six) times daily., Disp: 3 each, Rfl: 12 .  Continuous Glucose Monitor DEVI, Use as directed., Disp: 1 each, Rfl: 0 .  cyclobenzaprine (FLEXERIL) 5 MG tablet, Take 1 tablet (5 mg total) by mouth 2 (two) times daily., Disp: 20 tablet, Rfl: 0 .  fluticasone (FLONASE) 50 MCG/ACT nasal spray, Place 1 spray into both nostrils daily., Disp: 16 g, Rfl: 2 .  gabapentin (NEURONTIN) 300 MG capsule, Take 3 capsules (900 mg total) by mouth at bedtime., Disp: 90 capsule, Rfl: 0 .  glucose blood (ONETOUCH VERIO) test strip, Check blood sugar 3 times a day, Disp: 100 each, Rfl: 5 .  hydrochlorothiazide (HYDRODIURIL) 25 MG tablet, Take 1 tablet (25 mg total) by mouth daily., Disp: 30 tablet, Rfl: 0 .  ibuprofen (ADVIL,MOTRIN) 800 MG tablet, Take 1 tablet (800 mg total) by mouth every 8 (eight) hours as needed., Disp: 40 tablet, Rfl: 0 .  Insulin Disposable Pump (V-GO 30) KIT, 1 kit by Does not apply route daily., Disp: 1 kit, Rfl: 0 .  Insulin Pen Needle (B-D UF III MINI PEN NEEDLES) 31G X 5 MM MISC, Use to inject insulin twice a day, Disp: 190 each, Rfl: 5 .  insulin regular human CONCENTRATED (HUMULIN R) 500 UNIT/ML injection, Use to Fill vgo daily. Use 1 click with snacks and 3-4 clicks with meals., Disp: 20 mL, Rfl: 3 .  Insulin Syringe-Needle  U-100 (INSULIN SYRINGE 1CC/30GX1/2") 30G X 1/2" 1 ML MISC, Use to fill Vgo daily, Disp: 100 each, Rfl: 5 .  liraglutide 18 MG/3ML SOPN, Inject 0.3 mLs (1.8 mg total) into the skin daily., Disp: 6 mL, Rfl: 5 .  metFORMIN (GLUCOPHAGE) 1000 MG tablet, Take 1 tablet (1,000 mg total) by mouth 2 (two) times daily with a meal., Disp: 180 tablet, Rfl: 3 .  Multiple Vitamin (MULTIVITAMIN WITH MINERALS) TABS tablet, Take 1 tablet by mouth daily., Disp: , Rfl:  .  ONETOUCH DELICA LANCETS FINE MISC, Check blood sugar 3 times a day, Disp: 100 each, Rfl: 12 .  oxyCODONE-acetaminophen (PERCOCET/ROXICET) 5-325 MG tablet, Take 1 tablet by mouth every 8 (eight) hours as needed. For back pain., Disp: 50 tablet, Rfl: 0 .  pantoprazole (PROTONIX) 40 MG tablet, Take 1 tablet (40 mg total) by mouth 2 (two) times daily., Disp: 60 tablet, Rfl: 0 .  rosuvastatin (CRESTOR) 20 MG tablet, Take 1 tablet (20 mg total) by mouth at bedtime., Disp: 30 tablet, Rfl: 0 .  sucralfate (CARAFATE) 1 g tablet, Take  1 tablet (1 g total) by mouth 4 (four) times daily -  with meals and at bedtime., Disp: 28 tablet, Rfl: 0   Review of Systems     Objective:   Physical Exam  Vitals:   04/12/17 0935  BP: 122/70  Pulse: 87  SpO2: 97%  Weight: 260 lb 3.2 oz (118 kg)  Height: '5\' 3"'$  (1.6 m)    Estimated body mass index is 46.09 kg/m as calculated from the following:   Height as of this encounter: '5\' 3"'$  (1.6 m).   Weight as of this encounter: 260 lb 3.2 oz (118 kg).      Assessment:       ICD-10-CM   1. Chronic cough R05 POCT EXHALED NITRIC OXIDE  2. Irritable larynx J38.7   3. Chronic obstructive pulmonary disease, unspecified COPD type ([place]) J44.9        Plan:      Glad you are better with symbicort Agree that gabapentin might not be working for you for cough  plan Continue symbicort 2 puff twice dialy Slowly come off gabapentin  - take 2 capsules at night for 1 week - this week   - take 1 capsule at night for 1  week - next week   - take 1 capsule at night - Tuesday, Thursday and Saturday the following week  - then stop  FOllowup 3 months or sooner if needed   Dr. Brand Males, M.D., [facility].C.P Pulmonary and Critical Care Medicine Staff Physician [facility] Pager: [phone], If no answer or between  15:00h - 7:00h: call [phone]  04/12/2017 10:01 AM

## 2017-04-12 NOTE — Addendum Note (Signed)
Addended by: Lorretta Harp on: 04/12/2017 10:58 AM   Modules accepted: Orders

## 2017-04-12 NOTE — Patient Instructions (Signed)
Chronic cough Irritable larynx Chronic obstructive pulmonary disease, unspecified COPD type ([place])  Glad you are better with symbicort Agree that gabapentin might not be working for you for cough  plan Continue symbicort 2 puff twice dialy Slowly come off gabapentin  - take 2 capsules at night for 1 week - this week   - take 1 capsule at night for 1 week - next week   - take 1 capsule at night - Tuesday, Thursday and Saturday the following week  - then stop  FOllowup 3 months or sooner if needed

## 2017-04-14 DIAGNOSIS — M25512 Pain in left shoulder: Secondary | ICD-10-CM | POA: Diagnosis not present

## 2017-04-14 DIAGNOSIS — S46012D Strain of muscle(s) and tendon(s) of the rotator cuff of left shoulder, subsequent encounter: Secondary | ICD-10-CM | POA: Diagnosis not present

## 2017-04-19 DIAGNOSIS — M25512 Pain in left shoulder: Secondary | ICD-10-CM | POA: Diagnosis not present

## 2017-04-19 DIAGNOSIS — S46012D Strain of muscle(s) and tendon(s) of the rotator cuff of left shoulder, subsequent encounter: Secondary | ICD-10-CM | POA: Diagnosis not present

## 2017-04-24 ENCOUNTER — Ambulatory Visit: Payer: PPO | Admitting: Internal Medicine

## 2017-04-24 ENCOUNTER — Other Ambulatory Visit: Payer: Self-pay

## 2017-04-24 ENCOUNTER — Ambulatory Visit (INDEPENDENT_AMBULATORY_CARE_PROVIDER_SITE_OTHER): Payer: PPO | Admitting: Dietician

## 2017-04-24 ENCOUNTER — Encounter: Payer: Self-pay | Admitting: Internal Medicine

## 2017-04-24 ENCOUNTER — Encounter: Payer: Self-pay | Admitting: Dietician

## 2017-04-24 VITALS — BP 151/82 | HR 96 | Temp 98.6°F | Ht 63.0 in | Wt 256.6 lb

## 2017-04-24 DIAGNOSIS — K219 Gastro-esophageal reflux disease without esophagitis: Secondary | ICD-10-CM | POA: Diagnosis not present

## 2017-04-24 DIAGNOSIS — E785 Hyperlipidemia, unspecified: Secondary | ICD-10-CM | POA: Diagnosis not present

## 2017-04-24 DIAGNOSIS — Z794 Long term (current) use of insulin: Secondary | ICD-10-CM

## 2017-04-24 DIAGNOSIS — E1140 Type 2 diabetes mellitus with diabetic neuropathy, unspecified: Secondary | ICD-10-CM

## 2017-04-24 DIAGNOSIS — E119 Type 2 diabetes mellitus without complications: Secondary | ICD-10-CM

## 2017-04-24 DIAGNOSIS — Z713 Dietary counseling and surveillance: Secondary | ICD-10-CM | POA: Diagnosis not present

## 2017-04-24 DIAGNOSIS — I10 Essential (primary) hypertension: Secondary | ICD-10-CM

## 2017-04-24 MED ORDER — HYDROCHLOROTHIAZIDE 25 MG PO TABS: 25.0000 mg | ORAL_TABLET | Freq: Every day | ORAL | 0 refills | Status: DC

## 2017-04-24 MED ORDER — RANITIDINE HCL 300 MG PO TABS: 300.0000 mg | ORAL_TABLET | Freq: Every day | ORAL | 0 refills | Status: DC

## 2017-04-24 MED ORDER — ROSUVASTATIN CALCIUM 20 MG PO TABS: 20.0000 mg | ORAL_TABLET | Freq: Every day | ORAL | 0 refills | Status: DC

## 2017-04-24 NOTE — Patient Instructions (Signed)
It was a pleasure to see you today [name].  We will see you back in 1 week to make adjustments based on your blood sugar monitor results.  You can try adding ranitidine (Zantac) nightly to your acid reflux medicines. This may or may not be beneficial but we can follow up how this is helping.

## 2017-04-24 NOTE — Progress Notes (Signed)
[name] was educated about how to place sensor for [employer] Personal CGM. Patient applied and started her first sensor while here in the office. She was educated about how to use reader, precautions, rotation of the sensor placement,  to remove the sensor for xrays, CT and MRIs, to wand the sensor with the reader at least every 8 hours, to check blood sugar with meter when promted.    Her meter was downloaded and printout given to [name].  Lab Results  Component Value Date   HGBA1C 8.5 03/27/2017   [name], [name], [name] 04/24/2017 12:10 PM.

## 2017-04-24 NOTE — Patient Instructions (Signed)
Place a new sensor every 10 days. It should prompt you when it is time to change it.  Check blood sugar at least every 8 hours, right before you go to bed and when you wake up.  Be careful when dressing to lift clothes over sensor, when washing wash around the sensor.   If sensor falls off, you can purchase a bottle of skin tac on [address] for 5$.  Please call with questions or concerns.   Sharice [phone]

## 2017-04-24 NOTE — Progress Notes (Signed)
CC: Diabetes follow up with CGM placement  HPI:  [name] is a 56 y.o. female with PMHx detailed below presenting for follow up of her diaebtes after starting the VGO device last month and for new placement of CGM.  See problem based assessment and plan below for additional details.  Type 2 diabetes mellitus with diabetic neuropathy She started using the V-GO 30 with U-500 insulin since last month to work on her diabetes control. Since 10/14 she has had a mean glucose of 196 23% within target range and 70 recorded measurements. She had no measurements below the low range of 69. She reports feeling her glucose low yesterday and once 2 weeks ago, and found it to be 90 when checking. She eats lightly at lunch or forgets but eats breakfast and dinner and usually one snack.  Based on her pre-meal glucose measurement she is using 3 clicks at breakfast, 1-2 clicks at lunch, 3-4 clicks at dinner, and 1 for a snack. This is effectively 150 units basal insulin, 30 at breakfast, 10-20 at lunch, 30-40 at dinner, and 10 units with a snack. (TDD 230-250 which is 2units/kg)  She is here today and had the Freestyle CGM placed with [facility] and plans to continue the current regimen. I agree with waiting to increase insulin doses until after seeing data, since she is worried about lows. It may be that she does not tolerated CBGs below 90 due to previous constant hyperglycemia but hopefully this will correct over the next few weeks. Plan CGM review in 1 week and prandial dose adjustments at that time  GERD She has right upper abdominal pain intermittently for months. She also has heartburn despite continued use of protonix BID. She had upper endoscopy earlier this year that showed changes of gastritis but no obvious ulcer or obstructive pathology. It is unclear why symptoms are currently worse than usual. I am not sure about the yield of adding more acid suppressing mediation but recommended she could try  taking ranitidine at night and see if this helps her night time heartburn or coughing. She will let me know at follow up later this month if it was helpful.  Hyperlipidemia Reordered rosuvastatin today  Essential hypertension Blood pressure is mildly uncontrolled today at 151/82. She reports taking her medication as usual. I reordered her hydrochlorothiazide today. This is not urgent and can follow up with PCP if she remains consistently elevated.    Past Medical History:  Diagnosis Date  . Allergy   . Arthritis    back   . Asthma    AS CHILD  . Chronic back pain   . Chronic leg pain    due to back pain  . 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 Diabetes ([place])    with neuropathy  . GERD (gastroesophageal reflux disease)   . Hyperlipidemia   . Hypertension   . Neuromuscular disorder (H[facility])    neuropathy  . RECTAL BLEEDING 12/09/2008   Annotation: s/p EGD 7/08 mild gastritis, s/p colonoscopy 7/08- benign polyp  s/p polypectomy and isolated diverticulum.  Qualifier: Diagnosis of  By: Ditzler RN, Debra    . Sleep apnea    CPAP    YEARS AGO DONE 1/2 YEARS AGO AND WAS TOLD DID NOT HAVE  . Tobacco abuse   . Uterine fibroid    s/p hysterectomy    Review of Systems: Review of Systems  Constitutional: Negative for malaise/fatigue.  Eyes: Negative for blurred vision.  Respiratory:  Negative for shortness of breath.   Cardiovascular: Negative for chest pain.  Gastrointestinal: Positive for heartburn. Negative for diarrhea, nausea and vomiting.  Genitourinary: Positive for frequency.  Endo/Heme/Allergies: Positive for polydipsia.     Physical Exam: Vitals:   04/24/17 0902  BP: (!) 151/82  Pulse: 96  Temp: 98.6 F (37 C)  TempSrc: Oral  SpO2: 97%  Weight: 256 lb 9.6 oz (116.4 kg)  Height: 5\' 3"  (1.6 m)   GENERAL- alert, co-operative, NAD HEENT- Oral mucosa appears moist CARDIAC- RRR, no murmurs, rubs or gallops. RESP- CTAB, no wheezes or crackles. ABDOMEN- Nontender, BS  present throughout EXTREMITIES- pulse 2+, symmetric, no pedal edema. SKIN- Warm, dry, No rash or lesion.   Assessment & Plan:   See encounters tab for problem based medical decision making.   Patient discussed with Dr. Angelia Mould

## 2017-04-25 NOTE — Assessment & Plan Note (Addendum)
Blood pressure is mildly uncontrolled today at 151/82. She reports taking her medication as usual. I reordered her hydrochlorothiazide today. This is not urgent and can follow up with PCP if she remains consistently elevated.

## 2017-04-25 NOTE — Assessment & Plan Note (Signed)
She started using the V-GO 30 with U-500 insulin since last month to work on her diabetes control. Since 10/14 she has had a mean glucose of 196 23% within target range and 70 recorded measurements. She had no measurements below the low range of 69. She reports feeling her glucose low yesterday and once 2 weeks ago, and found it to be 90 when checking. She eats lightly at lunch or forgets but eats breakfast and dinner and usually one snack.  Based on her pre-meal glucose measurement she is using 3 clicks at breakfast, 1-2 clicks at lunch, 3-4 clicks at dinner, and 1 for a snack. This is effectively 150 units basal insulin, 30 at breakfast, 10-20 at lunch, 30-40 at dinner, and 10 units with a snack. (TDD 230-250 which is 2units/kg)  She is here today and had the Freestyle CGM placed with [employer] and plans to continue the current regimen. I agree with waiting to increase insulin doses until after seeing data, since she is worried about lows. It may be that she does not tolerated CBGs below 90 due to previous constant hyperglycemia but hopefully this will correct over the next few weeks. Plan CGM review in 1 week and prandial dose adjustments at that time

## 2017-04-25 NOTE — Assessment & Plan Note (Signed)
Reordered rosuvastatin today

## 2017-04-25 NOTE — Assessment & Plan Note (Signed)
She has right upper abdominal pain intermittently for months. She also has heartburn despite continued use of protonix BID. She had upper endoscopy earlier this year that showed changes of gastritis but no obvious ulcer or obstructive pathology. It is unclear why symptoms are currently worse than usual. I am not sure about the yield of adding more acid suppressing mediation but recommended she could try taking ranitidine at night and see if this helps her night time heartburn or coughing. She will let me know at follow up later this month if it was helpful.

## 2017-04-26 ENCOUNTER — Telehealth: Payer: Self-pay | Admitting: Dietician

## 2017-04-26 ENCOUNTER — Encounter: Payer: Self-pay | Admitting: Dietician

## 2017-04-26 DIAGNOSIS — N3946 Mixed incontinence: Secondary | ICD-10-CM | POA: Diagnosis not present

## 2017-04-26 NOTE — Telephone Encounter (Signed)
Left message to follow up on use of Freestyle Libre CGM.

## 2017-04-26 NOTE — Progress Notes (Signed)
Internal Medicine Clinic Attending  Case discussed with Dr. Rice at the time of the visit.  We reviewed the resident's history and exam and pertinent patient test results.  I agree with the assessment, diagnosis, and plan of care documented in the resident's note.  

## 2017-04-27 DIAGNOSIS — M25512 Pain in left shoulder: Secondary | ICD-10-CM | POA: Diagnosis not present

## 2017-04-27 DIAGNOSIS — S46012D Strain of muscle(s) and tendon(s) of the rotator cuff of left shoulder, subsequent encounter: Secondary | ICD-10-CM | POA: Diagnosis not present

## 2017-05-01 ENCOUNTER — Ambulatory Visit: Payer: PPO | Admitting: Dietician

## 2017-05-01 ENCOUNTER — Encounter: Payer: Self-pay | Admitting: Dietician

## 2017-05-01 ENCOUNTER — Encounter: Payer: Self-pay | Admitting: Internal Medicine

## 2017-05-01 ENCOUNTER — Ambulatory Visit (INDEPENDENT_AMBULATORY_CARE_PROVIDER_SITE_OTHER): Payer: PPO | Admitting: Internal Medicine

## 2017-05-01 ENCOUNTER — Encounter (HOSPITAL_COMMUNITY): Payer: Self-pay

## 2017-05-01 VITALS — BP 142/78 | HR 99 | Temp 98.0°F | Wt 258.8 lb

## 2017-05-01 DIAGNOSIS — Z794 Long term (current) use of insulin: Principal | ICD-10-CM

## 2017-05-01 DIAGNOSIS — E1140 Type 2 diabetes mellitus with diabetic neuropathy, unspecified: Secondary | ICD-10-CM

## 2017-05-01 MED ORDER — FREESTYLE LIBRE 14 DAY SENSOR MISC
1.0000 | Freq: Four times a day (QID) | 12 refills | Status: DC
Start: 2017-05-01 — End: 2017-05-02

## 2017-05-01 MED ORDER — FREESTYLE LIBRE 14 DAY READER DEVI: 1.0000 | Freq: Four times a day (QID) | 0 refills | Status: DC

## 2017-05-01 NOTE — Progress Notes (Signed)
   CC: Follow up for diabetes management  HPI:  [name] is a 56 y.o. female with PMHx detailed below presenting for follow up of diabetes management with CGM for the past week.  See problem based assessment and plan below for additional details.  Type 2 diabetes mellitus with diabetic neuropathy She is here today to follow up for CGM interpretation while continuing treatment with the VGO 30 device with U-500 insulin. Unfortunately her data is limited today due to no readings reported between about 430pm and 1230am. We discussed this with the company by phone at our visit today. There was a concern that not wanding the device frequently enough led to this problem although it sounds like she has been doing so pretty often. Her lowest CBG detected was around 70 in the morning once and not associated with any symptoms. Plan I recommended specifically making a point to check the device before bed and early after waking to hopefully capture this missing data. Increase lunch and dinner time dose to 4 clicks (40 units), 2 clicks for snacks with encouragement to eat spread out meals (2 snacks per day) Follow up next week for CGM interpretation with full data New 14 day sensor and reader Rx sent to [facility]    Past Medical History:  Diagnosis Date  . Allergy   . Arthritis    back   . Asthma    AS CHILD  . Chronic back pain   . Chronic leg pain    due to back pain  . Cocaine abuse ([place])    in remission  . Diabetes ([place])    with neuropathy  . GERD (gastroesophageal reflux disease)   . Hyperlipidemia   . Hypertension   . Neuromuscular disorder ([facility])    neuropathy  . RECTAL BLEEDING 12/09/2008   Annotation: s/p EGD 7/08 mild gastritis, s/p colonoscopy 7/08- benign polyp  s/p polypectomy and isolated diverticulum.  Qualifier: Diagnosis of  By: Ditzler RN, Debra    . Sleep apnea    CPAP    YEARS AGO DONE 1/2 YEARS AGO AND WAS TOLD DID NOT HAVE  . Tobacco abuse   . Uterine fibroid    s/p hysterectomy    Review of Systems: Review of Systems  Constitutional: Negative for malaise/fatigue.  Cardiovascular: Negative for leg swelling.  Genitourinary: Negative for frequency.  Neurological: Negative for sensory change.  Endo/Heme/Allergies: Negative for polydipsia.     Physical Exam: Vitals:   05/01/17 0843  BP: (!) 142/78  Pulse: 99  Temp: 98 F (36.7 C)  TempSrc: Oral  SpO2: 99%  Weight: 258 lb 12.8 oz (117.4 kg)   GENERAL- alert, co-operative, NAD HEENT- Oral mucosa appears moist RESP- CTAB, no wheezes or crackles. EXTREMITIES- Symmetric, no pedal edema. SKIN- Warm, dry, No rash or lesion.    Assessment & Plan:   See encounters tab for problem based medical decision making.   Patient discussed with Dr. Eppie Gibson

## 2017-05-01 NOTE — Patient Instructions (Signed)
Sorry your meter data is not all available here, you are doing a good job working on your diabetes so far. Let's make sure you are scanning it every night before sleep as well as in the mornings to make sure this isn't a cause for losign data.  Your blood sugar appears to be high in the evening and overnight, and the breakfast dose seems to be adequate for controlling blood sugar. I recommend increasing your lunch and dinner insulin to be 4 clicks unless you are below 100. You can continue to use 3-4 at breakfast as needed. Continue to take 2 clicks with extra snacks during the day.  We will try to see you again with more complete meter data in a little over a week.

## 2017-05-01 NOTE — Progress Notes (Signed)
Diabetes Self Management Training/Edication and Support. Start time:845 AM  End time:910 AM Ms. Sadik was seen today in follow up to her [place] CGM. Patient applied and started her second sensor after her first sensor fell off. That one also came off after rubbing ti with a towel.  She was reeducated about how to place and care for the sensor, when to wand the sensor with the reader at least every 8 hours but preferably every 4 hours, to check blood sugar with meter when promted.    Assisted Ms. Pattison in completing application for upgrade to the new 14 days reader and sensors. Requested prescriptions for both.  Her meter and CGM were downloaded and discussed with Dr. Benjamine Mola. Follow up appointment for DSMT scheduled for 05/10/17. 25 minutes spent with patient today.   Lab Results  Component Value Date   HGBA1C 8.5 03/27/2017   Karolyna Bianchini, Butch Penny, [place] 05/01/2017 1:52 PM.

## 2017-05-01 NOTE — Progress Notes (Signed)
Patient ID: [name], female   DOB: 02-20-1961, 56 y.o.   MRN: [id number]  Case discussed with Dr. Benjamine Mola at the time of the visit. We reviewed the resident's history and exam and pertinent patient test results. I agree with the assessment, diagnosis, and plan of care documented in the resident's note.

## 2017-05-01 NOTE — Assessment & Plan Note (Signed)
She is here today to follow up for CGM interpretation while continuing treatment with the VGO 30 device with U-500 insulin. Unfortunately her data is limited today due to no readings reported between about 430pm and 1230am. We discussed this with the company by phone at our visit today. There was a concern that not wanding the device frequently enough led to this problem although it sounds like she has been doing so pretty often. Her lowest CBG detected was around 70 in the morning once and not associated with any symptoms. Plan I recommended specifically making a point to check the device before bed and early after waking to hopefully capture this missing data. Increase lunch and dinner time dose to 4 clicks (40 units), 2 clicks for snacks with encouragement to eat spread out meals (2 snacks per day) Follow up next week for CGM interpretation with full data New 14 day sensor and reader Rx sent to [facility]

## 2017-05-02 ENCOUNTER — Other Ambulatory Visit: Payer: Self-pay | Admitting: Dietician

## 2017-05-02 DIAGNOSIS — Z794 Long term (current) use of insulin: Principal | ICD-10-CM

## 2017-05-02 DIAGNOSIS — E1140 Type 2 diabetes mellitus with diabetic neuropathy, unspecified: Secondary | ICD-10-CM

## 2017-05-02 MED ORDER — FREESTYLE LIBRE 14 DAY READER DEVI: 1.0000 | Freq: Four times a day (QID) | 0 refills | Status: DC

## 2017-05-02 MED ORDER — FREESTYLE LIBRE 14 DAY SENSOR MISC: 1.0000 | Freq: Four times a day (QID) | 12 refills | Status: DC

## 2017-05-10 ENCOUNTER — Ambulatory Visit (INDEPENDENT_AMBULATORY_CARE_PROVIDER_SITE_OTHER): Payer: PPO | Admitting: Internal Medicine

## 2017-05-10 ENCOUNTER — Encounter: Payer: Self-pay | Admitting: Internal Medicine

## 2017-05-10 ENCOUNTER — Other Ambulatory Visit: Payer: Self-pay | Admitting: Dietician

## 2017-05-10 ENCOUNTER — Ambulatory Visit (INDEPENDENT_AMBULATORY_CARE_PROVIDER_SITE_OTHER): Payer: PPO | Admitting: Dietician

## 2017-05-10 ENCOUNTER — Other Ambulatory Visit: Payer: Self-pay

## 2017-05-10 VITALS — BP 161/78 | HR 93 | Temp 98.4°F | Wt 259.8 lb

## 2017-05-10 DIAGNOSIS — Z794 Long term (current) use of insulin: Secondary | ICD-10-CM

## 2017-05-10 DIAGNOSIS — R1030 Lower abdominal pain, unspecified: Secondary | ICD-10-CM | POA: Diagnosis not present

## 2017-05-10 DIAGNOSIS — Z713 Dietary counseling and surveillance: Secondary | ICD-10-CM

## 2017-05-10 DIAGNOSIS — E1140 Type 2 diabetes mellitus with diabetic neuropathy, unspecified: Secondary | ICD-10-CM

## 2017-05-10 DIAGNOSIS — N393 Stress incontinence (female) (male): Secondary | ICD-10-CM

## 2017-05-10 LAB — POCT URINALYSIS DIPSTICK
Bilirubin, UA: NEGATIVE
Blood, UA: NEGATIVE
Ketones, UA: NEGATIVE
Leukocytes, UA: NEGATIVE
Nitrite, UA: NEGATIVE
Protein, UA: NEGATIVE
Spec Grav, UA: 1.01 (ref 1.010–1.025)
Urobilinogen, UA: 0.2 U/dL
pH, UA: 6.5 (ref 5.0–8.0)

## 2017-05-10 MED ORDER — V-GO 30 KIT: 1.0000 | PACK | Freq: Every day | 12 refills | Status: DC

## 2017-05-10 NOTE — Telephone Encounter (Signed)
Called [facility] who said she needs refill on vgo30. Also her 14 day freestyle libre sensors are free and the reader is not covered (she needs to take in the coupon we generated at her lat visit for the free upgrade) patient called an notified.

## 2017-05-10 NOTE — Progress Notes (Signed)
  Medical Nutrition Therapy:  Appt start time: 0915 end time:  930. Visit # 5, last visit was 03/2017  Assessment:  Primary concerns today: weight loss and blood sugar control follow up.   Jonise reports her blood sugars are under better control, nw she wants to work more on her weight which has stayed the same. She is eating out a lot less- she rates this 4/5.  She has supportive friends and a gym membership that she is thinking about using to join her friedn in water [employer].. She needs help figuring out how and when to upgrade to the 14 days Freestylelibre.   Learning Readiness: Contemplating  ANTHROPOMETRICS: weight-259#, BMI-46, ~ the same SLEEP:need to assess at future visit MEDICATIONS: liraglutide 1.8mg  daily, metformin, u500 in a vgo30 = ~250 units of insulin daily per her report BLOOD SUGAR: improved to average of 167 today for past few weeks  Usual physical activity: adls and sedentary work  Estimated daily energy needs for weight loss 1800-2000 calories Set goal of 55 grams of fat/day to assist with lowering blood sugars and calorie intake  Progress Towards Goal(s):  No progress.   Nutritional Diagnosis:  NB-1.3 Not ready for diet/lifestyle change  As related to unknown barriers to change and working on her diabetes self care  As evidenced by her hesitancy to make more definitive changes and stay with them.    Intervention:  Nutrition education and counseling about behavior change, aligning with support systems to assist with success. Coordination of care: needs refills for VGo and help with freestyle libre sensors and upgrade Teaching Method Utilized: Visual,Auditory, Hands on Handouts given during visit include: vgo30 sample Barriers to learning/adherence to lifestyle change: time, resources Demonstrated degree of understanding via:  Teach Back   Monitoring/Evaluation:  Dietary intake, exercise, meter, and body weight 4-8 weeks Dorota Heinrichs, Butch Penny, RD 05/10/2017 11:32  AM. .

## 2017-05-10 NOTE — Patient Instructions (Signed)
FOLLOW-UP INSTRUCTIONS When: About 2 months For: Diabetes, blood pressure, medications What to bring:   You are doing a great job.  Your morning sugars are getting almost low. Consider decreasing the evening meal or evening snack dose by 1 click. Alternatively make sure to eat breakfast early in the morning or dinner late in the evening.  I recommend seeing your PCP within the next 2 months or so for follow up.

## 2017-05-10 NOTE — Progress Notes (Signed)
CC: Follow up for CGM and diabetes treatment  HPI:  [name] is a 56 y.o. female with PMHx detailed below presenting for follow up of her diabetes with CGM data interpretation. She also has some left lower abdominal pain.  See problem based assessment and plan below for additional details.  Type 2 diabetes mellitus with diabetic neuropathy [name] wore the CGM for 14 days. The average reading was 167, % time in target was 55, % time below target was 3, and % time above target was. 42. Intervention will be to Reduce evening insulin dose by 1 click to decrease risk of overnight hypoglycemia. She has already been seen by me earlier this month for interval review and adjustment, with great improvement indicated in mean glucose and time at goal. She has not felt any symptoms from getting down to 70 CBG before eating breakfast. Her evening sugar is no longer so high after increase the mealtime coverage by 10 units (1 click) at lunch, dinner, and snacks. We will go back down on the dinner dose for now since she has relative lows some mornings due to the high basal rate. Changing her VGO device to adjust basal rate would be a much larger reduction than she needs though. I congratulated her on making progress this month at self-managing her diabetes Assessment Diabetes now likely at or near good control with mean plasma glucose of 167 Plan Continue current treatment but reduce dinner coverage to 3 clicks (30 units) Reordered supplies for continued personal CGM RTC with routine PCP follow up of diabetes  Lower abdominal pain She has left lower abdominal mild pain that is ongoing for about a week. There are no associated symptoms such as diarrhea, constipation, or flatulence. She has not noticed any overlying skin changes. She alternates the location of her VGO device between left and right side. Assessment Mild abdominal wall pain, without concerning features I suspect this might be from  wearing her pants relatively tightly over the VGO device where it is located today Plan Observe, instructed to call back to clinic if she develops rash, bruising, or diarrhea  Stress incontinence in female She describes infrequent urinary leakage associated with laughter or bearing down. This was previously evaluated and she says she was told she would need a surgery to fix something falling down in her urinary tract. She does not currently feel the symptoms are severe enough she wishes to start any new medication or be evaluated by urogynecology. She has not had any problem with recurrent urinary tract infections.    Past Medical History:  Diagnosis Date  . Allergy   . Arthritis    back   . Asthma    AS CHILD  . Chronic back pain   . Chronic leg pain    due to back pain  . Cocaine abuse ([place])    in remission  . Diabetes ([place])    with neuropathy  . GERD (gastroesophageal reflux disease)   . Hyperlipidemia   . Hypertension   . Neuromuscular disorder ([facility])    neuropathy  . RECTAL BLEEDING 12/09/2008   Annotation: s/p EGD 7/08 mild gastritis, s/p colonoscopy 7/08- benign polyp  s/p polypectomy and isolated diverticulum.  Qualifier: Diagnosis of  By: Ditzler RN, Debra    . Sleep apnea    CPAP    YEARS AGO DONE 1/2 YEARS AGO AND WAS TOLD DID NOT HAVE  . Tobacco abuse   . Uterine fibroid    s/p hysterectomy  Review of Systems: Review of Systems  Genitourinary: Positive for frequency. Negative for dysuria.  Skin: Negative for rash.  Neurological: Negative for sensory change.  Endo/Heme/Allergies: Negative for polydipsia. Does not bruise/bleed easily.     Physical Exam: Vitals:   05/10/17 0836  BP: (!) 161/78  Pulse: 93  Temp: 98.4 F (36.9 C)  TempSrc: Oral  SpO2: 99%  Weight: 259 lb 12.8 oz (117.8 kg)   GENERAL- alert, co-operative, NAD HEENT- Oral mucosa appears moist CARDIAC- RRR, no murmurs, rubs or gallops. RESP- CTAB, no wheezes or crackles. ABDOMEN- Obese,  nontender, no guarding or rebound EXTREMITIES- No pedal edema. SKIN- Warm, dry, No rash or lesion. PSYCH- Normal mood and affect, appropriate thought content and speech.    Assessment & Plan:   See encounters tab for problem based medical decision making.   Patient discussed with Dr. Angelia Mould

## 2017-05-11 ENCOUNTER — Encounter: Payer: Self-pay | Admitting: Dietician

## 2017-05-11 NOTE — Telephone Encounter (Signed)
[name] calls saying she lost coupon for the free reader. unable to regenerate it fro the [employer] website but was able to give her a number to contact at [employer] for assistance.

## 2017-05-12 NOTE — Assessment & Plan Note (Signed)
She describes infrequent urinary leakage associated with laughter or bearing down. This was previously evaluated and she says she was told she would need a surgery to fix something falling down in her urinary tract. She does not currently feel the symptoms are severe enough she wishes to start any new medication or be evaluated by urogynecology. She has not had any problem with recurrent urinary tract infections.

## 2017-05-12 NOTE — Assessment & Plan Note (Signed)
She has left lower abdominal mild pain that is ongoing for about a week. There are no associated symptoms such as diarrhea, constipation, or flatulence. She has not noticed any overlying skin changes. She alternates the location of her VGO device between left and right side. Assessment Mild abdominal wall pain, without concerning features I suspect this might be from wearing her pants relatively tightly over the VGO device where it is located today Plan Observe, instructed to call back to clinic if she develops rash, bruising, or diarrhea

## 2017-05-12 NOTE — Assessment & Plan Note (Signed)
[name] wore the CGM for 14 days. The average reading was 167, % time in target was 55, % time below target was 3, and % time above target was. 42. Intervention will be to Reduce evening insulin dose by 1 click to decrease risk of overnight hypoglycemia. She has already been seen by me earlier this month for interval review and adjustment, with great improvement indicated in mean glucose and time at goal. She has not felt any symptoms from getting down to 70 CBG before eating breakfast. Her evening sugar is no longer so high after increase the mealtime coverage by 10 units (1 click) at lunch, dinner, and snacks. We will go back down on the dinner dose for now since she has relative lows some mornings due to the high basal rate. Changing her VGO device to adjust basal rate would be a much larger reduction than she needs though. I congratulated her on making progress this month at self-managing her diabetes Assessment Diabetes now likely at or near good control with mean plasma glucose of 167 Plan Continue current treatment but reduce dinner coverage to 3 clicks (30 units) Reordered supplies for continued personal CGM RTC with routine PCP follow up of diabetes

## 2017-05-15 NOTE — Progress Notes (Signed)
Internal Medicine Clinic Attending  Case discussed with Dr. Rice at the time of the visit.  We reviewed the resident's history and exam and pertinent patient test results.  I agree with the assessment, diagnosis, and plan of care documented in the resident's note.  

## 2017-05-25 ENCOUNTER — Other Ambulatory Visit: Payer: Self-pay | Admitting: Internal Medicine

## 2017-05-25 DIAGNOSIS — M545 Low back pain: Principal | ICD-10-CM

## 2017-05-25 DIAGNOSIS — G8929 Other chronic pain: Secondary | ICD-10-CM

## 2017-05-25 NOTE — Telephone Encounter (Signed)
Refill Request    oxyCODONE-acetaminophen (PERCOCET/ROXICET) 5-325 MG tablet(Expired)

## 2017-05-29 ENCOUNTER — Telehealth: Payer: Self-pay | Admitting: Internal Medicine

## 2017-05-29 MED ORDER — OXYCODONE-ACETAMINOPHEN 5-325 MG PO TABS: 1.0000 | ORAL_TABLET | Freq: Three times a day (TID) | ORAL | 0 refills | Status: DC | PRN

## 2017-05-29 NOTE — Telephone Encounter (Signed)
Patient calling to see if pain med was ready to be picked up

## 2017-05-31 NOTE — Telephone Encounter (Signed)
Done, sent to pharm 

## 2017-06-02 ENCOUNTER — Other Ambulatory Visit: Payer: Self-pay | Admitting: Internal Medicine

## 2017-06-02 DIAGNOSIS — G8929 Other chronic pain: Secondary | ICD-10-CM

## 2017-06-02 DIAGNOSIS — M545 Low back pain: Principal | ICD-10-CM

## 2017-06-02 MED ORDER — OXYCODONE-ACETAMINOPHEN 5-325 MG PO TABS: 1.0000 | ORAL_TABLET | Freq: Three times a day (TID) | ORAL | 0 refills | Status: DC | PRN

## 2017-06-02 NOTE — Telephone Encounter (Signed)
Rx ready - called pt , no answer,left message.

## 2017-06-02 NOTE — Telephone Encounter (Signed)
WANTS REFILL ON PAIN MEDICATION FOR BACK, REQUESTED REFILL LAST FRIDAY

## 2017-06-15 ENCOUNTER — Other Ambulatory Visit: Payer: Self-pay | Admitting: *Deleted

## 2017-06-15 DIAGNOSIS — K219 Gastro-esophageal reflux disease without esophagitis: Secondary | ICD-10-CM

## 2017-06-16 MED ORDER — RANITIDINE HCL 300 MG PO TABS: 300.0000 mg | ORAL_TABLET | Freq: Every day | ORAL | 1 refills | Status: DC

## 2017-06-16 MED ORDER — PANTOPRAZOLE SODIUM 40 MG PO TBEC: 40.0000 mg | DELAYED_RELEASE_TABLET | Freq: Two times a day (BID) | ORAL | 0 refills | Status: DC

## 2017-06-16 NOTE — Telephone Encounter (Signed)
pantoprazole (PROTONIX) 40 MG tablet(Expired)  ranitidine (ZANTAC) 300 MG tablet, Refill request @ [facility] on [address].

## 2017-06-29 ENCOUNTER — Ambulatory Visit (INDEPENDENT_AMBULATORY_CARE_PROVIDER_SITE_OTHER): Payer: PPO | Admitting: Internal Medicine

## 2017-06-29 ENCOUNTER — Encounter: Payer: Self-pay | Admitting: Internal Medicine

## 2017-06-29 VITALS — BP 159/78 | HR 99 | Temp 97.9°F | Ht 63.5 in | Wt 255.5 lb

## 2017-06-29 DIAGNOSIS — R400 Somnolence: Secondary | ICD-10-CM | POA: Diagnosis not present

## 2017-06-29 DIAGNOSIS — Z9989 Dependence on other enabling machines and devices: Secondary | ICD-10-CM | POA: Diagnosis not present

## 2017-06-29 DIAGNOSIS — L602 Onychogryphosis: Secondary | ICD-10-CM | POA: Diagnosis not present

## 2017-06-29 NOTE — Patient Instructions (Addendum)
[name],  It was a pleasure meeting you today. A sleep study has been ordered and a podiatry referral has been made. Please follow-up with your primary care doctor at the end of February.

## 2017-06-29 NOTE — Assessment & Plan Note (Signed)
Assessment: Daytime somnolence Patient states that she currently uses a CPAP machine and needs to reorder supplies. Per records patient's sleep study in 01/2015 did not qualify patient for OSA. Today she states that when she does not use her CPAP machine she has daytime somnolence. TSH was checked less than a year ago within normal limits. BMI has remained unchanged in the past 2 years. Since patient is having symptoms will repeat a sleep study.  Plan -Sleep study -Follow-up with PCP in one month

## 2017-06-29 NOTE — Progress Notes (Signed)
   CC: Follow up on daytime somnolence   HPI:  [name] is a 57 y.o. female with history noted below that presents to the acute care clinic for daytime somnolence. Please see problem based charting for the status of patient's chronic medical conditions.  Past Medical History:  Diagnosis Date  . Allergy   . Arthritis    back   . Asthma    AS CHILD  . Chronic back pain   . Chronic leg pain    due to back pain  . 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 GERD (gastroesophageal reflux disease)   . Hyperlipidemia   . Hypertension   . Neuromuscular disorder ([facility])    neuropathy  . RECTAL BLEEDING 12/09/2008   Annotation: s/p EGD 7/08 mild gastritis, s/p colonoscopy 7/08- benign polyp  s/p polypectomy and isolated diverticulum.  Qualifier: Diagnosis of  By: Ditzler RN, Crystal    . Sleep apnea    CPAP    YEARS AGO DONE 1/2 YEARS AGO AND WAS TOLD DID NOT HAVE  . Tobacco abuse   . Uterine fibroid    s/p hysterectomy    Review of Systems:  Review of Systems  Respiratory: Negative for shortness of breath.   Cardiovascular: Negative for chest pain and leg swelling.  Neurological: Negative for dizziness.     Physical Exam:  Vitals:   06/29/17 0851  BP: (!) 159/78  Pulse: 99  Temp: 97.9 F (36.6 C)  TempSrc: Oral  SpO2: 98%  Weight: 255 lb 8 oz (115.9 kg)  Height: 5' 3.5" (1.613 m)   Physical Exam  Constitutional: She is well-developed, well-nourished, and in no distress.  Cardiovascular: Normal rate, regular rhythm and normal heart sounds. Exam reveals no gallop and no friction rub.  No murmur heard. Pulmonary/Chest: Effort normal and breath sounds normal. No respiratory distress. She has no wheezes. She has no rales.  Skin:  5th left toe nail overgrowth and loosely connected  No cuts or signs of infection on left foot    Assessment & Plan:   See encounters tab for problem based medical decision making.    Patient discussed  with Dr. Eppie [name]

## 2017-06-29 NOTE — Assessment & Plan Note (Signed)
Assessment: overgrowth of left foot 5th toenail Patient states that 6 months ago she went to the nail salon and afterwards her 5th toe nail has had issues with growing back normally.  At this point the nail is barely connected.  Will refer to podiatry  Plan -podiatry referral

## 2017-06-30 NOTE — Progress Notes (Signed)
Case discussed with Dr. Ratliff-Hoffman at the time of the visit.  We reviewed the resident's history and exam and pertinent patient test results.  I agree with the assessment, diagnosis and plan of care documented in the resident's note. 

## 2017-07-18 ENCOUNTER — Other Ambulatory Visit: Payer: Self-pay

## 2017-07-18 ENCOUNTER — Ambulatory Visit (INDEPENDENT_AMBULATORY_CARE_PROVIDER_SITE_OTHER): Payer: PPO | Admitting: Internal Medicine

## 2017-07-18 ENCOUNTER — Encounter: Payer: Self-pay | Admitting: Internal Medicine

## 2017-07-18 VITALS — BP 140/76 | HR 96 | Temp 98.2°F | Ht 63.5 in | Wt 257.8 lb

## 2017-07-18 DIAGNOSIS — H524 Presbyopia: Secondary | ICD-10-CM | POA: Diagnosis not present

## 2017-07-18 DIAGNOSIS — H5203 Hypermetropia, bilateral: Secondary | ICD-10-CM | POA: Diagnosis not present

## 2017-07-18 DIAGNOSIS — Z0000 Encounter for general adult medical examination without abnormal findings: Secondary | ICD-10-CM

## 2017-07-18 DIAGNOSIS — H52223 Regular astigmatism, bilateral: Secondary | ICD-10-CM | POA: Diagnosis not present

## 2017-07-18 DIAGNOSIS — G8929 Other chronic pain: Secondary | ICD-10-CM | POA: Diagnosis not present

## 2017-07-18 DIAGNOSIS — E1140 Type 2 diabetes mellitus with diabetic neuropathy, unspecified: Secondary | ICD-10-CM | POA: Diagnosis not present

## 2017-07-18 DIAGNOSIS — H11423 Conjunctival edema, bilateral: Secondary | ICD-10-CM | POA: Diagnosis not present

## 2017-07-18 DIAGNOSIS — Z23 Encounter for immunization: Secondary | ICD-10-CM

## 2017-07-18 DIAGNOSIS — Z794 Long term (current) use of insulin: Secondary | ICD-10-CM

## 2017-07-18 DIAGNOSIS — M545 Low back pain: Secondary | ICD-10-CM

## 2017-07-18 DIAGNOSIS — H04123 Dry eye syndrome of bilateral lacrimal glands: Secondary | ICD-10-CM | POA: Diagnosis not present

## 2017-07-18 DIAGNOSIS — H18413 Arcus senilis, bilateral: Secondary | ICD-10-CM | POA: Diagnosis not present

## 2017-07-18 DIAGNOSIS — H11153 Pinguecula, bilateral: Secondary | ICD-10-CM | POA: Diagnosis not present

## 2017-07-18 DIAGNOSIS — H2513 Age-related nuclear cataract, bilateral: Secondary | ICD-10-CM | POA: Diagnosis not present

## 2017-07-18 DIAGNOSIS — H25013 Cortical age-related cataract, bilateral: Secondary | ICD-10-CM | POA: Diagnosis not present

## 2017-07-18 DIAGNOSIS — H1045 Other chronic allergic conjunctivitis: Secondary | ICD-10-CM | POA: Diagnosis not present

## 2017-07-18 DIAGNOSIS — H40033 Anatomical narrow angle, bilateral: Secondary | ICD-10-CM | POA: Diagnosis not present

## 2017-07-18 DIAGNOSIS — H40023 Open angle with borderline findings, high risk, bilateral: Secondary | ICD-10-CM | POA: Diagnosis not present

## 2017-07-18 DIAGNOSIS — Z79891 Long term (current) use of opiate analgesic: Secondary | ICD-10-CM

## 2017-07-18 DIAGNOSIS — M549 Dorsalgia, unspecified: Secondary | ICD-10-CM | POA: Diagnosis not present

## 2017-07-18 MED ORDER — OXYCODONE-ACETAMINOPHEN 5-325 MG PO TABS
1.0000 | ORAL_TABLET | Freq: Three times a day (TID) | ORAL | 0 refills | Status: DC | PRN
Start: 2017-07-18 — End: 2017-09-19

## 2017-07-18 MED ORDER — OXYCODONE-ACETAMINOPHEN 5-325 MG PO TABS: 1.0000 | ORAL_TABLET | Freq: Three times a day (TID) | ORAL | 0 refills | Status: DC | PRN

## 2017-07-18 NOTE — Patient Instructions (Addendum)
Ms. Guzek it was nice seeing you today.  Vgo insulin: Use 3-4 clicks with breakfast, 3-4 clicks with lunch, and 4-5 clicks with dinner.  Continue using 2 clicks with snacks.  Return for a follow-up in 4 weeks.

## 2017-07-18 NOTE — Progress Notes (Signed)
   CC: Patient is here to discuss her diabetes.  HPI:  [name] is a 57 y.o. female with a past medical history of conditions listed below presenting to the clinic to discuss her diabetes. Preventative health care was also discussed. Please see problem based charting for the status of the patient's current and chronic medical conditions.   Past Medical History:  Diagnosis Date  . Allergy   . Arthritis    back   . Asthma    AS CHILD  . Chronic back pain   . Chronic leg pain    due to back pain  . 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 GERD (gastroesophageal reflux disease)   . Hyperlipidemia   . Hypertension   . Neuromuscular disorder ([facility])    neuropathy  . RECTAL BLEEDING 12/09/2008   Annotation: s/p EGD 7/08 mild gastritis, s/p colonoscopy 7/08- benign polyp  s/p polypectomy and isolated diverticulum.  Qualifier: Diagnosis of  By: Ditzler RN, Debra    . Sleep apnea    CPAP    YEARS AGO DONE 1/2 YEARS AGO AND WAS TOLD DID NOT HAVE  . Tobacco abuse   . Uterine fibroid    s/p hysterectomy   Review of Systems: Pertinent positives mentioned in HPI. Remainder of all ROS negative.   Physical Exam:  Vitals:   07/18/17 1604  BP: 140/76  Pulse: 96  Temp: 98.2 F (36.8 C)  TempSrc: Oral  SpO2: 98%  Weight: 257 lb 12.8 oz (116.9 kg)  Height: 5' 3.5" (1.613 m)   Physical Exam  Constitutional: She is oriented to person, place, and time. She appears well-developed and well-nourished. No distress.  HENT:  Head: Normocephalic and atraumatic.  Eyes: Right eye exhibits no discharge. Left eye exhibits no discharge.  Cardiovascular: Normal rate, regular rhythm and intact distal pulses.  Pulmonary/Chest: Effort normal and breath sounds normal. No respiratory distress. She has no wheezes. She has no rales.  Abdominal: Soft. Bowel sounds are normal. She exhibits no distension. There is no tenderness.  Musculoskeletal: She exhibits no edema.    Neurological: She is alert and oriented to person, place, and time.  Skin: Skin is warm and dry.    Assessment & Plan:   See Encounters Tab for problem based charting.  Patient discussed with Dr. Angelia Mould

## 2017-07-18 NOTE — Assessment & Plan Note (Addendum)
This problem is chronic and stable.  Patient is requesting a refill on Percocet.  We did not have time to do an updated pain contract at this visit as the entire visit was focused toward her diabetes.  Patient requested the pain medication just before leaving the clinic.  Review of [place] controlled substance database shows the last dispense date was June 02, 2017.  Plan -Refilled Percocet 5-325 mg 1 tablet every 8 hours as needed, #50 -Update pain contract at next visit

## 2017-07-18 NOTE — Assessment & Plan Note (Addendum)
[name] wore the CGM for 14 days. The average reading was 187, % time in target was 46, % time below target was 2, and % time above target was 52. Intervention will be to increase dose of preprandial insulin.  She is currently on Vgo 30.  He is currently using 3-4 clicks with meals 3 times a day and 2 clicks with snacks twice a day.  Plan is to increase the dose of preprandial insulin at dinnertime. She has been instructed to use 3-4 clicks with breakfast, 3-4 clicks with lunch, and 4-5 clicks with dinner.  Instructed to continue using 2 clicks twice a day with snacks.  She reports eating candy an ice cream.  Counseled her on diet and exercise.  Encouraged her to continue following up with But[facility]; referral has been placed.  She has an appointment with ophthalmology at the [facility] on March 11.  We will check her A1c at her next visit.

## 2017-07-18 NOTE — Assessment & Plan Note (Signed)
Pneumococcal 23 valent vaccine administered at this visit.  Next dose will be due in 5 years.

## 2017-07-19 NOTE — Progress Notes (Signed)
Internal Medicine Clinic Attending  Case discussed with Dr. Rathoreat the time of the visit. We reviewed the resident's history and exam and pertinent patient test results. I agree with the assessment, diagnosis, and plan of care documented in the resident's note.  

## 2017-07-23 IMAGING — MG DIGITAL DIAGNOSTIC UNILATERAL LEFT MAMMOGRAM
1 series · 1 of 1 positions shown · non-contrast
Comparison: Previous exam(s).

CLINICAL DATA: Preoperative seed localization, prior to left breast
excisional biopsy for suspected intraductal papilloma.

EXAM:
ULTRASOUND GUIDED RADIOACTIVE SEED LOCALIZATION OF THE LEFT BREAST

[L]
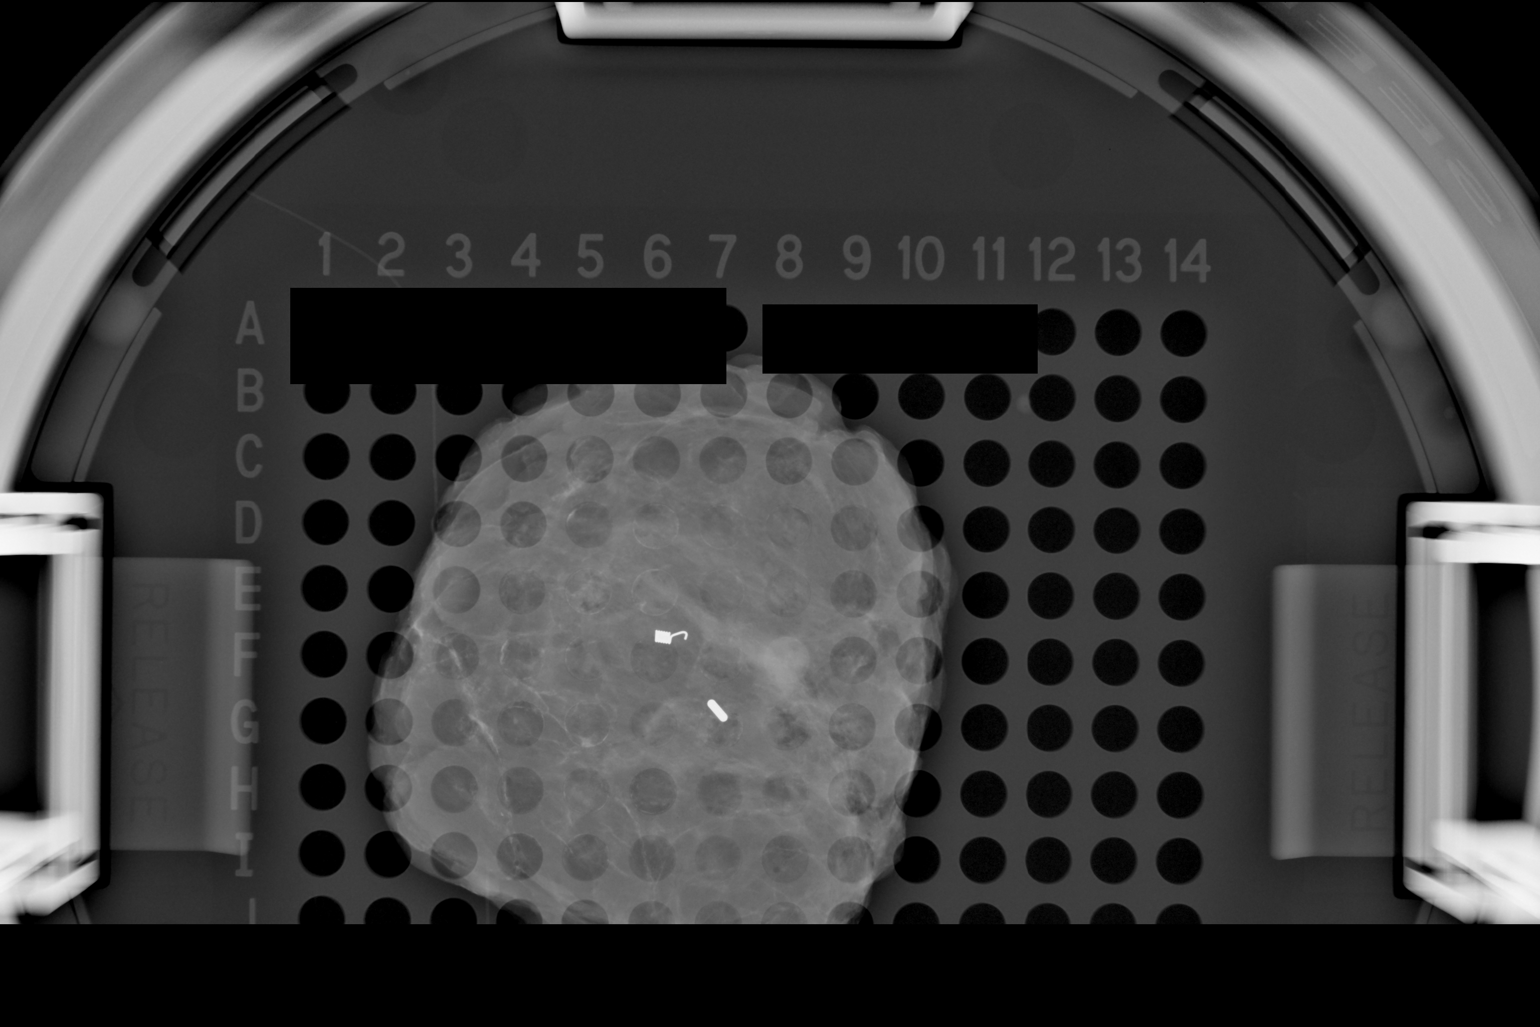

[1 of 1 positions shown; findings below may reference images not displayed]

FINDINGS: Patient presents for radioactive seed localization prior to left
breast excisional biopsy. I met with the patient and we discussed
the procedure of seed localization including benefits and
alternatives. We discussed the high likelihood of a successful
procedure. We discussed the risks of the procedure including
infection, bleeding, tissue injury and further surgery. We discussed
the low dose of radioactivity involved in the procedure. Informed,
written consent was given.

The usual time-out protocol was performed immediately prior to the
procedure.

Using ultrasound guidance, sterile technique, 1% lidocaine and an
3-ZCK radioactive seed, left subareolar breast superficially located
mass was localized using a inferior approach. The follow-up
mammogram images confirm the seed in the expected location and were
marked for Dr. Rudi.

Follow-up survey of the patient confirms presence of the radioactive
seed.

Order number of 3-ZCK seed:  [id number].

Total activity: 0.245 millicurie Reference Date: February 02, 2016

The patient tolerated the procedure well and was released from the
[REDACTED]. She was given instructions regarding seed removal.
IMPRESSION: Radioactive seed localization of the left breast. No apparent
complications.

## 2017-07-25 ENCOUNTER — Encounter: Payer: Self-pay | Admitting: Podiatry

## 2017-07-25 ENCOUNTER — Ambulatory Visit: Payer: PPO | Admitting: Podiatry

## 2017-07-25 DIAGNOSIS — B353 Tinea pedis: Secondary | ICD-10-CM | POA: Diagnosis not present

## 2017-07-25 DIAGNOSIS — L601 Onycholysis: Secondary | ICD-10-CM

## 2017-07-25 MED ORDER — CLOTRIMAZOLE-BETAMETHASONE 1-0.05 % EX CREA: 1.0000 "application " | TOPICAL_CREAM | Freq: Two times a day (BID) | CUTANEOUS | 0 refills | Status: DC

## 2017-07-25 NOTE — Progress Notes (Signed)
Subjective:    Patient ID: [name], female    DOB: 1961-06-05, 57 y.o.   MRN: [id number]  HPI  [name] presents the office today is her left fifth toenail came off about 3 weeks ago.  She did get a pedicure she says her toenail did turn a darker discoloration in the nail eventually came off on its own without any injury.  He states that about 2 weeks she did put a Band-Aid on the area.  She denies any swelling or redness or any drainage or pus.  She has no other concerns to her toes.  Also while she is here I did notice an area of a skin rash on the left arch of her foot that she states occasionally will itch she will get small little blisters to the area at times.  She said no recent treatment for this.  She has no other concerns today.  Review of Systems  All other systems reviewed and are negative.  Past Medical History:  Diagnosis Date  . Allergy   . Arthritis    back   . Asthma    AS CHILD  . Chronic back pain   . Chronic leg pain    due to back pain  . 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 GERD (gastroesophageal reflux disease)   . Hyperlipidemia   . Hypertension   . Neuromuscular disorder ([facility])    neuropathy  . RECTAL BLEEDING 12/09/2008   Annotation: s/p EGD 7/08 mild gastritis, s/p colonoscopy 7/08- benign polyp  s/p polypectomy and isolated diverticulum.  Qualifier: Diagnosis of  By: Ditzler RN, Debra    . Sleep apnea    CPAP    YEARS AGO DONE 1/2 YEARS AGO AND WAS TOLD DID NOT HAVE  . Tobacco abuse   . Uterine fibroid    s/p hysterectomy    Past Surgical History:  Procedure Laterality Date  . BACK SURGERY  2012   L5-S1 microendoscopic disectomy last surgery 06/2011  . BREAST BIOPSY     LEFT    01/19/16  . BREAST REDUCTION SURGERY  1982  . COLONOSCOPY    . HAND SURGERY     MIDDLE TRIGGER FINGER RIGHT SIDE  . RADIOACTIVE SEED GUIDED EXCISIONAL BREAST BIOPSY Left 02/18/2016   Procedure: LEFT RADIOACTIVE SEED GUIDED  EXCISIONAL BREAST BIOPSY;  Surgeon: Alphonsa Overall, MD;  Location: [facility];  Service: General;  Laterality: Left;  . TONSILLECTOMY  2008  . TOTAL ABDOMINAL HYSTERECTOMY  05/24/2007   hysterectomy  . TUBAL LIGATION    . UPPER GASTROINTESTINAL ENDOSCOPY       Current Outpatient Medications:  .  acetaminophen (TYLENOL) 500 MG tablet, Take 2 tablets (1,000 mg total) by mouth every 8 (eight) hours as needed. DO NOT take more than 6 tablets per day., Disp: 30 tablet, Rfl: 0 .  acyclovir (ZOVIRAX) 400 MG tablet, Take 1 tablet (400 mg total) by mouth 3 (three) times daily as needed (flares)., Disp: 90 tablet, Rfl: 3 .  albuterol (PROAIR HFA) 108 (90 Base) MCG/ACT inhaler, Inhale 2 puffs into the lungs every 4 (four) hours as needed for wheezing or shortness of breath., Disp: 3 Inhaler, Rfl: 2 .  aspirin 81 MG EC tablet, Take 81 mg by mouth daily.  , Disp: , Rfl:  .  Blood Glucose Monitoring Suppl (ONETOUCH VERIO FLEX SYSTEM) w/Device KIT, 1 each by Does not apply route 3 (three) times  daily., Disp: 1 kit, Rfl: 1 .  budesonide-formoterol (SYMBICORT) 80-4.5 MCG/ACT inhaler, Inhale 2 puffs into the lungs 2 (two) times daily as needed (shortness of breath)., Disp: 1 Inhaler, Rfl: 5 .  budesonide-formoterol (SYMBICORT) 80-4.5 MCG/ACT inhaler, Inhale 2 puffs into the lungs 2 (two) times daily., Disp: 1 Inhaler, Rfl: 5 .  calcium carbonate (CALCIUM 600) 600 MG TABS tablet, Take 600 mg by mouth., Disp: , Rfl:  .  Cetirizine HCl (ZYRTEC ALLERGY) 10 MG CAPS, Take 1 capsule by mouth daily for allergies., Disp: 30 capsule, Rfl: 0 .  clotrimazole-betamethasone (LOTRISONE) cream, Apply 1 application topically 2 (two) times daily., Disp: 30 g, Rfl: 0 .  Continuous Blood Gluc Receiver (FREESTYLE LIBRE 14 DAY READER) DEVI, 1 each by Does not apply route 4 (four) times daily., Disp: 1 Device, Rfl: 0 .  Continuous Blood Gluc Sensor (FREESTYLE LIBRE 14 DAY SENSOR) MISC, 1 each by Does not apply route 4 (four) times daily.,  Disp: 2 each, Rfl: 12 .  Continuous Glucose Monitor DEVI, Use as directed., Disp: 1 each, Rfl: 0 .  cyclobenzaprine (FLEXERIL) 5 MG tablet, Take 1 tablet (5 mg total) by mouth 2 (two) times daily., Disp: 20 tablet, Rfl: 0 .  fluticasone (FLONASE) 50 MCG/ACT nasal spray, Place 1 spray into both nostrils daily., Disp: 16 g, Rfl: 2 .  gabapentin (NEURONTIN) 300 MG capsule, Take 3 capsules (900 mg total) by mouth at bedtime., Disp: 90 capsule, Rfl: 0 .  glucose blood (ONETOUCH VERIO) test strip, Check blood sugar 3 times a day, Disp: 100 each, Rfl: 5 .  hydrochlorothiazide (HYDRODIURIL) 25 MG tablet, Take 1 tablet (25 mg total) daily by mouth., Disp: 90 tablet, Rfl: 0 .  ibuprofen (ADVIL,MOTRIN) 800 MG tablet, Take 1 tablet (800 mg total) by mouth every 8 (eight) hours as needed., Disp: 40 tablet, Rfl: 0 .  Insulin Disposable Pump (V-GO 30) KIT, 1 kit by Does not apply route daily. Fill and place daily with insulin, use 4 clicks with meals and 2 clicks with snacks, Disp: 1 kit, Rfl: 12 .  Insulin Pen Needle (B-D UF III MINI PEN NEEDLES) 31G X 5 MM MISC, Use to inject insulin twice a day, Disp: 190 each, Rfl: 5 .  insulin regular human CONCENTRATED (HUMULIN R) 500 UNIT/ML injection, Use to Fill vgo daily. Use 1 click with snacks and 3-4 clicks with meals., Disp: 20 mL, Rfl: 3 .  Insulin Syringe-Needle U-100 (INSULIN SYRINGE 1CC/30GX1/2") 30G X 1/2" 1 ML MISC, Use to fill Vgo daily, Disp: 100 each, Rfl: 5 .  liraglutide 18 MG/3ML SOPN, Inject 0.3 mLs (1.8 mg total) into the skin daily., Disp: 6 mL, Rfl: 5 .  losartan (COZAAR) 50 MG tablet, Take 50 mg by mouth., Disp: , Rfl:  .  metFORMIN (GLUCOPHAGE) 1000 MG tablet, Take 1 tablet (1,000 mg total) by mouth 2 (two) times daily with a meal., Disp: 180 tablet, Rfl: 3 .  Multiple Vitamin (MULTIVITAMIN WITH MINERALS) TABS tablet, Take 1 tablet by mouth daily., Disp: , Rfl:  .  ONETOUCH DELICA LANCETS FINE MISC, Check blood sugar 3 times a day, Disp: 100 each,  Rfl: 12 .  oxyCODONE-acetaminophen (PERCOCET/ROXICET) 5-325 MG tablet, Take 1 tablet by mouth every 8 (eight) hours as needed. For back pain. Fill prescription 30 days from last dispense date., Disp: 50 tablet, Rfl: 0 .  pantoprazole (PROTONIX) 40 MG tablet, Take 1 tablet (40 mg total) by mouth 2 (two) times daily., Disp: 180 tablet, Rfl: 0 .  ranitidine (ZANTAC) 300 MG tablet, Take 1 tablet (300 mg total) by mouth at bedtime., Disp: 90 tablet, Rfl: 1 .  rosuvastatin (CRESTOR) 20 MG tablet, Take 1 tablet (20 mg total) at bedtime by mouth., Disp: 90 tablet, Rfl: 0 .  simvastatin (ZOCOR) 40 MG tablet, Take 40 mg by mouth., Disp: , Rfl:  .  sucralfate (CARAFATE) 1 g tablet, Take 1 tablet (1 g total) by mouth 4 (four) times daily -  with meals and at bedtime., Disp: 28 tablet, Rfl: 0 .  valACYclovir (VALTREX) 500 MG tablet, valacyclovir 500 mg tablet, Disp: , Rfl:   Allergies  Allergen Reactions  . Losartan Cough  . Lisinopril Cough    Social History   Socioeconomic History  . Marital status: Married    Spouse name: Not on file  . Number of children: 3  . Years of education: Not on file  . Highest education level: Not on file  Social Needs  . Financial resource strain: Not on file  . Food insecurity - worry: Not on file  . Food insecurity - inability: Not on file  . Transportation needs - medical: Not on file  . Transportation needs - non-medical: Not on file  Occupational History  . 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    Employer: [employer]  Tobacco Use  . Smoking status: Former Smoker    Packs/day: 0.50    Years: 20.00    Pack years: 10.00    Types: Cigarettes    Last attempt to quit: 07/23/1998    Years since quitting: 19.0  . Smokeless tobacco: Never Used  Substance and Sexual Activity  . Alcohol use: No    Alcohol/week: 0.0 oz    Comment: recovering addict clean for 9 years  . Drug use: No    Comment: recovering addict clean for 9 years  . Sexual activity:  Not on file  Other Topics Concern  . Not on file  Social History Narrative   Unemployed but has started taking community college classes (interested in accounting) since having back surgery, which has improved pain and mobility. Hopes to return to work soon. Lives with husband and daughter.  Uninsured.        Objective:   Physical Exam General: AAO x3, NAD  Dermatological: There is a new toenail certainly come into the left fifth toe although appear to be somewhat dystrophic.  There is no pain to the nail there is no discoloration at this time although there is only minimal nail present.  No surrounding erythema, ascending cellulitis.  No drainage or pus.  There is no clinical signs of infection noted today.  Along the arch of the left foot is a area of what appears to be a small skin rash with suggesting this itch.  No increase in warmth or swelling.  There is no pustules present and there is no skin breakdown.  This appears to be a localized tinea pedis.  No other open lesions or pre-ulcerative lesion identified today.  Vascular: Dorsalis Pedis artery and Posterior Tibial artery pedal pulses are 2/4 bilateral with immedate capillary fill time.  There is no pain with calf compression, swelling, warmth, erythema.   Neruologic: Grossly intact via light touch bilateral. Protective threshold with Semmes Wienstein monofilament intact to all pedal sites bilateral.   Musculoskeletal: No gross boney pedal deformities bilateral. No pain, crepitus, or limitation noted with foot and ankle range of motion bilateral. Muscular strength 5/5 in all groups tested bilateral.  Gait: Unassisted, Nonantalgic.  Assessment & Plan:  57 year old female onychomycosis left fifth digit toenail currently without any pain or signs of infection; skin rash left arch of the foot likely tinea pedis -Treatment options discussed including all alternatives, risks, and complications -Etiology of symptoms were  discussed -Discussed that we will have to watch the left fifth toenail see how it comes back again.  Discussed that may be thick and discolored but does will start some treatment for that. -Prescribed Lotrisone cream to the left foot.  If this is not help in the next 2-3 weeks to call the office for follow-up and to discuss possible biopsy of the area. -Follow-up as scheduled or sooner if needed.  She has no further questions or concerns at this point.   Trula Slade DPM

## 2017-07-29 ENCOUNTER — Other Ambulatory Visit: Payer: Self-pay | Admitting: Internal Medicine

## 2017-07-29 DIAGNOSIS — Z794 Long term (current) use of insulin: Principal | ICD-10-CM

## 2017-07-29 DIAGNOSIS — E1140 Type 2 diabetes mellitus with diabetic neuropathy, unspecified: Secondary | ICD-10-CM

## 2017-08-04 ENCOUNTER — Other Ambulatory Visit: Payer: Self-pay | Admitting: Internal Medicine

## 2017-08-04 NOTE — Telephone Encounter (Signed)
Next appt scheduled  3/5 with PCP.

## 2017-08-11 ENCOUNTER — Ambulatory Visit (HOSPITAL_BASED_OUTPATIENT_CLINIC_OR_DEPARTMENT_OTHER): Payer: PPO | Attending: Internal Medicine | Admitting: Internal Medicine

## 2017-08-11 VITALS — Ht 63.0 in | Wt 255.0 lb

## 2017-08-11 DIAGNOSIS — R400 Somnolence: Secondary | ICD-10-CM

## 2017-08-11 DIAGNOSIS — G4733 Obstructive sleep apnea (adult) (pediatric): Secondary | ICD-10-CM | POA: Diagnosis not present

## 2017-08-15 ENCOUNTER — Ambulatory Visit (INDEPENDENT_AMBULATORY_CARE_PROVIDER_SITE_OTHER): Payer: PPO | Admitting: Internal Medicine

## 2017-08-15 ENCOUNTER — Encounter: Payer: Self-pay | Admitting: Dietician

## 2017-08-15 ENCOUNTER — Ambulatory Visit (INDEPENDENT_AMBULATORY_CARE_PROVIDER_SITE_OTHER): Payer: PPO | Admitting: Dietician

## 2017-08-15 ENCOUNTER — Encounter: Payer: Self-pay | Admitting: Internal Medicine

## 2017-08-15 ENCOUNTER — Other Ambulatory Visit: Payer: Self-pay

## 2017-08-15 VITALS — BP 134/80 | HR 88 | Temp 98.2°F | Ht 63.5 in | Wt 257.9 lb

## 2017-08-15 DIAGNOSIS — K760 Fatty (change of) liver, not elsewhere classified: Secondary | ICD-10-CM | POA: Diagnosis not present

## 2017-08-15 DIAGNOSIS — Z794 Long term (current) use of insulin: Secondary | ICD-10-CM

## 2017-08-15 DIAGNOSIS — M5117 Intervertebral disc disorders with radiculopathy, lumbosacral region: Secondary | ICD-10-CM

## 2017-08-15 DIAGNOSIS — E1140 Type 2 diabetes mellitus with diabetic neuropathy, unspecified: Secondary | ICD-10-CM

## 2017-08-15 DIAGNOSIS — G8929 Other chronic pain: Secondary | ICD-10-CM | POA: Diagnosis not present

## 2017-08-15 DIAGNOSIS — Z79891 Long term (current) use of opiate analgesic: Secondary | ICD-10-CM | POA: Diagnosis not present

## 2017-08-15 DIAGNOSIS — Z713 Dietary counseling and surveillance: Secondary | ICD-10-CM

## 2017-08-15 DIAGNOSIS — R202 Paresthesia of skin: Secondary | ICD-10-CM | POA: Diagnosis not present

## 2017-08-15 DIAGNOSIS — Z9889 Other specified postprocedural states: Secondary | ICD-10-CM

## 2017-08-15 DIAGNOSIS — E785 Hyperlipidemia, unspecified: Secondary | ICD-10-CM

## 2017-08-15 DIAGNOSIS — Z79899 Other long term (current) drug therapy: Secondary | ICD-10-CM | POA: Diagnosis not present

## 2017-08-15 DIAGNOSIS — Z6841 Body Mass Index (BMI) 40.0 and over, adult: Secondary | ICD-10-CM

## 2017-08-15 DIAGNOSIS — I10 Essential (primary) hypertension: Secondary | ICD-10-CM

## 2017-08-15 DIAGNOSIS — M545 Low back pain: Secondary | ICD-10-CM

## 2017-08-15 LAB — POCT GLYCOSYLATED HEMOGLOBIN (HGB A1C): Hemoglobin A1C: 8.8

## 2017-08-15 LAB — GLUCOSE, CAPILLARY: Glucose-Capillary: 105 mg/dL — ABNORMAL HIGH (ref 65–99)

## 2017-08-15 MED ORDER — GABAPENTIN 300 MG PO CAPS: 600.0000 mg | ORAL_CAPSULE | Freq: Every day | ORAL | 0 refills | Status: DC

## 2017-08-15 MED ORDER — ROSUVASTATIN CALCIUM 20 MG PO TABS: 20.0000 mg | ORAL_TABLET | Freq: Every day | ORAL | 3 refills | Status: DC

## 2017-08-15 NOTE — Progress Notes (Signed)
Medical Nutrition Therapy:  Appt start time: 4193 end time:  1500. Visit # 6, but 1st this year  Assessment:  Primary concerns today: weight loss and blood sugar control follow up.   Lyana is using the VGo30 with u500 for insulin administration. She reports filling them on at a time and no problems. She changes then every other day because there is still insulin left in it. She is using the freestyle libre personal CGM and a bloo glucose meter.  She says she is still trying to lose weight and is happy that it is at least stable.   She reports that she eats reasonable breakfasts ( oatmeal) and dinners( chicken broccoli rice)  daily with fruit and nuts as snacks during the day.   Learning Readiness: Contemplating ANTHROPOMETRICS: weight-257.9#, BMI-44, ~ the same SLEEP:need to assess at future visit MEDICATIONS: liraglutide 1.8mg  daily, metformin, u500 in a vgo30 = ~250 units of insulin daily per her report BLOOD SUGAR: average is 198 on freestyle and a1c today 8.8% Usual physical activity: adls and sedentary work  Estimated daily energy needs for weight loss 1800-2000 calories Set goal of 55 grams of fat/day to assist with lowering blood sugars and calorie intake  Progress Towards Goal(s):  No progress.   Nutritional Diagnosis:  NB-1.3 Not ready for diet/lifestyle change  As related to  working on her diabetes self care not addressed today As evidenced by her hesitancy to make more definitive changes in eating habits.   NC2.3 food medication interaction as related to not changing VGO daily as evidenced by her high A1C.     Intervention:  Nutrition re-education about how the [place] works and the importance of changing daily despite having to throw insulin away. Will look into if able to fill VGO with less insulin.  Coordination of care:Suggest trail of SGLT2inhibitor if possible and chekcing vitamins D and B12 Teaching Method Utilized: Visual,Auditory, Hands on Handouts given during visit  include: vgo30 sample Barriers to learning/adherence to lifestyle change: time, resources Demonstrated degree of understanding via:  Teach Back   Monitoring/Evaluation:  Dietary intake, exercise, meter, and body weight 1-2 weeks Debera Lat, RD 08/15/2017 4:15 PM. .

## 2017-08-15 NOTE — Patient Instructions (Signed)
For diabetes:  Continue using Vgo as before. No changes have been made today.  You have been referred to endocrinology.   For back pain:  Continue taking Percocet as instructed  Start taking gabapentin 600 mg at bedtime  You have been referred to neurosurgery.

## 2017-08-15 NOTE — Patient Instructions (Signed)
What we talked about today:   The importance of changing you VGO daily to controlling your blood sugars  The plan:   For the next week please change your VGO and put a new one one about the same time each day/ It is okay to prefill the VGO with u500 for up to 5 days and keep clean (put in ziploc bag) in the refrigerator.   See you in 2 weeks  Call anytime with questions or concerns  Yzabelle Calles Diabetes Educator [phone]

## 2017-08-16 LAB — CMP14 + ANION GAP
ALT: 33 IU/L — AB (ref 0–32)
ANION GAP: 14 mmol/L (ref 10.0–18.0)
AST: 34 IU/L (ref 0–40)
Albumin/Globulin Ratio: 1.3 (ref 1.2–2.2)
Albumin: 4.2 g/dL (ref 3.5–5.5)
Alkaline Phosphatase: 61 IU/L (ref 39–117)
BUN/Creatinine Ratio: 9 (ref 9–23)
BUN: 7 mg/dL (ref 6–24)
CALCIUM: 9.7 mg/dL (ref 8.7–10.2)
CHLORIDE: 99 mmol/L (ref 96–106)
CO2: 28 mmol/L (ref 20–29)
CREATININE: 0.81 mg/dL (ref 0.57–1.00)
GFR calc non Af Amer: 81 mL/min/{1.73_m2} (ref >59)
GFR, EST AFRICAN AMERICAN: 93 mL/min/{1.73_m2} (ref >59)
GLUCOSE: 78 mg/dL (ref 65–99)
Globulin, Total: 3.2 g/dL (ref 1.5–4.5)
Potassium: 3.4 mmol/L — ABNORMAL LOW (ref 3.5–5.2)
Sodium: 141 mmol/L (ref 134–144)
TOTAL PROTEIN: 7.4 g/dL (ref 6.0–8.5)

## 2017-08-16 NOTE — Assessment & Plan Note (Signed)
Patient has chronic lumbar radiculopathy.  Pain radiates from her lower back to her right leg and is associated with paresthesias.  Denies having any fecal/urinary incontinence or saddle anesthesia.  Denies having any lower extremity weakness.  She is currently using Percocet as needed.  MRI of lumbar spine done in March 2011 showing L5-S1 disc herniation and right S1 nerve root compression. She was last seen by neurosurgery at [facility] in March 2014.  Per chart review, she had a right-sided L5/S1 microendoscopic discectomy in 2012.  Plan -Referral to neurosurgery.  Patient is interested in speaking to a neurosurgeon locally here in [place] as her previous physician is not at [facility] anymore. -Gabapentin 600 mg at bedtime -Discussed referral to PT but patient declined

## 2017-08-16 NOTE — Assessment & Plan Note (Addendum)
BP Readings from Last 3 Encounters:  08/15/17 134/80  07/18/17 140/76  06/29/17 (!) 159/78    Lab Results  Component Value Date   NA 141 08/15/2017   K 3.4 (L) 08/15/2017   CREATININE 0.81 08/15/2017    Assessment: Blood pressure currently well controlled on hydrochlorothiazide 25 mg daily.  Plan: Medications:  continue current medications Educational resources provided:   Educated patient about healthy eating and exercise. Emphasized the importance of weight loss.  Other plans: Blood chemistry to assess renal function and electrolyte status.  Addendum: Potassium borderline low in the setting of chronic diuretic use. Spoke to the patient over the phone and called in [place] 20 meq daily into her pharmacy. Please check BMP at her next visit.

## 2017-08-16 NOTE — Assessment & Plan Note (Signed)
[name] wore the CGM for 12 days. The average reading was 198, % time in target was 27, % time below target was 4, and % time above target was 69. Intervention will be to continue current regimen of 150 units basal and 140 units preprandial via Vgo.  In addition, she is taking metformin 1000 mg twice daily and using Victoza 1.8 mg daily.  A1c 8.8 at this visit.  Patient states she woke up the morning of this appointment with a CBG of 64 which improved to 79 after she drank orange juice and ate candy.  As such, it would not be safe to increase the dose of her insulin at this time.  Patient has been referred to endocrinology for possible insulin pump and further management of her uncontrolled diabetes.

## 2017-08-16 NOTE — Progress Notes (Signed)
   CC: Patient is here for a regular checkup.  Diabetes, hypertension, hyperlipidemia, hepatic steatosis, and lumbar radiculopathy were discussed.  HPI:  [name] is a 57 y.o. female with a past medical history of conditions listed below presenting to the clinic for a regular checkup.  Diabetes, hypertension, hyperlipidemia, hepatic steatosis, and lumbar radiculopathy were discussed. Please see problem based charting for the status of the patient's current and chronic medical conditions.   Past Medical History:  Diagnosis Date  . Allergy   . Arthritis    back   . Asthma    AS CHILD  . Chronic back pain   . Chronic leg pain    due to back pain  . 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 Diabetes ([place])    with neuropathy  . GERD (gastroesophageal reflux disease)   . Hyperlipidemia   . Hypertension   . Neuromuscular disorder ([facility])    neuropathy  . RECTAL BLEEDING 12/09/2008   Annotation: s/p EGD 7/08 mild gastritis, s/p colonoscopy 7/08- benign polyp  s/p polypectomy and isolated diverticulum.  Qualifier: Diagnosis of  By: Ditzler RN, Debra    . Sleep apnea    CPAP    YEARS AGO DONE 1/2 YEARS AGO AND WAS TOLD DID NOT HAVE  . Tobacco abuse   . Uterine fibroid    s/p hysterectomy   Review of Systems: Pertinent positives mentioned in HPI. Remainder of all ROS negative.   Physical Exam:  Vitals:   08/15/17 1354  BP: 134/80  Pulse: 88  Temp: 98.2 F (36.8 C)  TempSrc: Oral  SpO2: 97%  Weight: 257 lb 14.4 oz (117 kg)  Height: 5' 3.5" (1.613 m)   Physical Exam  Constitutional: She is oriented to person, place, and time. She appears well-developed and well-nourished. No distress.  HENT:  Head: Normocephalic and atraumatic.  Mouth/Throat: Oropharynx is clear and moist.  Eyes: Right eye exhibits no discharge. Left eye exhibits no discharge.  Cardiovascular: Normal rate, regular rhythm and intact distal pulses.  Pulmonary/Chest: Effort normal and breath sounds normal. No  respiratory distress.  Abdominal: Soft. Bowel sounds are normal. She exhibits no distension. There is no tenderness.  Musculoskeletal: She exhibits no edema.  Neurological: She is alert and oriented to person, place, and time.  Lower extremities: Strength 5 out of 5 and sensation to light touch grossly intact.  Skin: Skin is warm and dry.    Assessment & Plan:   See Encounters Tab for problem based charting.  Patient discussed with Dr. Evette Doffing

## 2017-08-16 NOTE — Assessment & Plan Note (Signed)
LDL checked 7 months ago was 87.  Refilled Crestor 20 mg daily at this visit.

## 2017-08-16 NOTE — Assessment & Plan Note (Addendum)
Seen on chest CT done in July 2015.  Previous hepatic function panel done in April 2018 was normal.  Plan -Check LFTs again  Addendum: LFTs normal. Please repeat them in 1 year.

## 2017-08-18 MED ORDER — POTASSIUM CHLORIDE ER 20 MEQ PO TBCR: 20.0000 meq | EXTENDED_RELEASE_TABLET | Freq: Every day | ORAL | 0 refills | Status: DC

## 2017-08-18 NOTE — Addendum Note (Signed)
Addended by: Shela Leff on: 08/18/2017 12:36 PM   Modules accepted: Orders

## 2017-08-18 NOTE — Progress Notes (Signed)
Internal Medicine Clinic Attending  Case discussed with Dr. Rathoreat the time of the visit. We reviewed the resident's history and exam and pertinent patient test results. I agree with the assessment, diagnosis, and plan of care documented in the resident's note.  

## 2017-08-21 DIAGNOSIS — H40033 Anatomical narrow angle, bilateral: Secondary | ICD-10-CM | POA: Diagnosis not present

## 2017-08-21 DIAGNOSIS — H2513 Age-related nuclear cataract, bilateral: Secondary | ICD-10-CM | POA: Diagnosis not present

## 2017-08-24 ENCOUNTER — Ambulatory Visit: Payer: PPO | Admitting: Internal Medicine

## 2017-08-24 ENCOUNTER — Other Ambulatory Visit: Payer: PPO

## 2017-08-24 ENCOUNTER — Encounter: Payer: Self-pay | Admitting: Internal Medicine

## 2017-08-24 VITALS — BP 126/74 | HR 96 | Ht 63.5 in | Wt 258.2 lb

## 2017-08-24 DIAGNOSIS — R05 Cough: Secondary | ICD-10-CM | POA: Diagnosis not present

## 2017-08-24 DIAGNOSIS — J302 Other seasonal allergic rhinitis: Secondary | ICD-10-CM

## 2017-08-24 DIAGNOSIS — J387 Other diseases of larynx: Secondary | ICD-10-CM

## 2017-08-24 DIAGNOSIS — R053 Chronic cough: Secondary | ICD-10-CM

## 2017-08-24 DIAGNOSIS — J449 Chronic obstructive pulmonary disease, unspecified: Secondary | ICD-10-CM | POA: Diagnosis not present

## 2017-08-24 NOTE — Progress Notes (Signed)
Subjective:     Patient ID: [name], female   DOB: 1960-10-08, 57 y.o.   MRN: [id number]  HPI  _0  ID: [name], female    DOB: [birth year], 57 y.o.   MRN: [id number]  Chief Complaint  Patient presents with  . Follow-up    cough     Referring provider: Shela Leff, MD  HPI:   HPI   IOV 02/25/2013  57 year old nonsmoker obese female. Accompanied by her husband. Chief complaint is chronic cough.  Cough is of insidious onset a year ago. Was severe in intensity. And was progressive. Quality was a dry cough. There is associated ticklish sensation in her throat and constant clearing and gagging. In April 2014 was diagnosed to have esophageal dysmotility and in July 2014 saw Dr. Verdia Kuba who adjusted her proton pump inhibitor and change her diet. After this in the last several weeks her cough significantly improved and almost resolved. In between he esophagogram in seeing gastroenterologist Dr. Collene Mares she did see Dr. Melony Overly of ENT specialty who apparently did not see any abnormal with her vocal cords but treated her for sinusitis and this did not help her cough.   Cough relevant history   - Sinus/allergies  - Denies any problems. Never on nasal steroids. But does have some postnasal drip  - GI/reflux disease  - July 2008 had endoscopy which showed mild gastritis. She is on proton pump inhibitor  - Body mass index is 42.99 kg/(m^2).  - esophagogram Aug 2014: IMPRESSION:  Mild esophageal dysmotility.  Otherwise negative esophagram.  Original Report Authenticated By: Julian Hy, M.D.   - SAw DR Collene Mares July 2014 and PPI adjusted and Diet changed-> after this cough resolved   - Hypertension  -  She has hypertension but she is not on ACE inhibitor  - Pulmonary history  -  reports that she quit smoking about 14 years ago. Her smoking use included Cigarettes. She has a 10 pack-year smoking history. She has never used smokeless tobacco.   - CT chest  2006    -   Findings: No dissection is evident. No filling defect is identified in the pulmonary arterial tree to suggest pulmonary embolus. In the lateral basal segment of the right lower lobe there is some atelectasis adjacent to a small region of nodularity measuring 6 mm in diameter. This nodularity likely simply relates to atelectasis, but a true pulmonary nodule is difficult to exclude. I would recommend follow up limited noncontrast CT in 3 months time in order to reassess this region.  There is no hilar or mediastinal adenopathy.  IMPRESSION:  No embolus. Mild nodularity associated with right lower lobe subsegmental atx; recommend followup limited evaluation in 3 months in order to ensure that this clears or fails to progress. The main purpose of the followup is to rule out the statistically unlikely possibility that this represents early malignancy.    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  #Sinus  - continue take generic fluticasone inhaler 2 squirts each nostril daily - START 3% nasal saline spray at night 2 squietrs made by a company called [employer]  #ACid reflux  - most important reason for your cough  - folllow advice of Dr Collene Mares 100% without fail  #Irritable larynx  -2- 3 days of complete voice rest without whispering or talking - See MR CArl Schinke of  neuro rehab for speech therapy  #followup  - 4-6 weeks with me or my CMA [name] with cough score at followupo - If cough still a problem, will consider CT chest or neck and methacholine challenge test and Rx with neurontin   04/23/2013 Follow up Cough  6 week follow up - reports cough improves for a few days, then worsens again.Marland Kitchen Cough seems to wax and wane .  Not using anything to control cough .  Has sinus drip and drainage esp at night.  No fever, discolored mucus, chest pain, orthopnea or edema.  CXR 01/2013 with  no acute findings.  [name] Cough score 23 today   Begin Delsym 2 tsp Twice daily   Begin Tessalon 240m Three times a day   Begin Chlortrimeton 450m1 in am and 2 At bedtime  -may make you sleepy.  Continue on Protonix daily before meal  Continue on Zantac At bedtime   Work on not coughing or throat clearing.  Please contact office for sooner follow up if symptoms do not improve or worsen or seek emergency care  follow up Dr. RaChase Callern 4-6 weeks and As needed  OV 05/21/2013 Chief Complaint  Patient presents with  . Cough    follow-up. Pt states cough is not improved at all.   [age] female reports for followup of cough that is chronic. Since seeing me in September 2014 on the cough score her cough appears one third better but subjectively she says that her cough is unimproved. She reports compliance with a sinus treatment measures and acid reflux treatment measures. Despite attending speech therapy and following my advice and the advice of nurse practitioner a month ago cough is unimproved. She is frustrated. RSI cough score is 25 and details are below. She is open to more advanced testing    05/31/2013 Follow up  Returns for follow up and review test results . Seen last week with cough . No significant change in cough.   Patient underwent a pulmonary function test on December 12 that showed moderate airflow obstruction with FEV1 is 1.35 L/59%. Ratio 62. No sign change BD   Patient underwent CT chest on December 9 that showed mild bronchiectasis in the right middle lobe. No evidence of interstitial lung disease. There was an incidental nodule in the right lung base, measuring 7 mm.  CT sinus showed no acute sinus disease  She denies any hemoptysis, orthopnea, PND, or leg swelling.  REC Trial of Breo Inhaler 1 puff daily , brush/rinse and gargle after use.  Continue on Delsym 2 tsp Twice daily  For cough As needed   Continue on Tessalon 200m30mree times a day  As needed   Cough .  Continue use Chlortrimeton 4mg 38mn am and 2 At bedtime  -may make you sleepy.  Continue on Protonix daily before meal  Continue on Zantac At bedtime   Work on not coughing or throat clearing- use sips of water, sugarless candy. NO mints .  Please contact office for sooner follow up if symptoms do not improve or worsen or seek emergency care  follow up Dr. RamasChase Caller-6 weeks and As needed   OV 07/26/2013  Chief Complaint  Patient presents with  . Cough    follow-up. Pt states cough is same as last visti. no better.    Followup chronic cough  She says she is no better. For sinus she's on nasal spray, Flonase, last visit started on Chlor-Trimeton by my nurse practitioner.  For acid reflux she is on Protonix and Zantac. 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 RSI cough score is 21. 4 obstructive lung disease she is on Brio but this also has not helped. She's willing to try Neurontin she's been to speech therapy in the past. Neurontin made her gain weight and one of her chronic pain medications and she stopped it but has not had any adverse effects. I explained to her that Neurontin can be helpful and medications with irritable larynx or chronic cough or cyclical cough but can cause significant side effects of grogginess, fogginess, sleepiness and weight gain. She understands the side effects and is willing to give her short term trial of this  REportes good compliance but I doubt   REC Continue sinus, gerd, and inhaler therpay  - change breo to symbicort 2 puff bid  Take gabapentin 333m once daily x 5 days, then 3039mtwice daily x 5 days, then 3008mhree times daily to continue. If this makes you too sleepy or drowsy call us [facility] we will cut your medication dosing down   Return in 4 - 8 weeks   - cough score at followup   OV 09/06/2013  Chief Complaint  Patient presents with  . Cough    Reports cough has improved since last OV--Has  days it will try and come back     FU chronic cough:    She tells me she is "some" better but in reality the RSI cough score shows persistence of severe chronic cough. In terms of sinus control: She is only doing her nasal saline drops as needed. She's not doing nasal steroids and she is unclear why. In terms of obstructive lung disease: She says that she is compliant with his Symbicort although she did not know the name initially. In terms of acid reflux: She says she's compliant with the proton pump and better. In terms of irritable larynx: I advised her to start gabapentin at last visit but she does be that she showed up at the pharmacy and the pharmacist told her that in his years of experience he never saw gabapentin being prescribed for chronic cough so she decided not to treat herself with gabapentin. I asked her why she can call here and she had no answer.  I've advised her 100% compliance with treatment measures if she needs to improve. Because of her on the side effects of gabapentin extensively and after listening to this and the potential benefits based on lancet paper she is willing to give this a try   COugh  - not improved - important you follow instructions carefully, closely and 100% of the time   -  Sinus   -  take generic fluticasone inhaler 2 squirts each nostril daily  - continue nasal saline spray - GERD  : continue medications for this  - Airflow obstruction  :  symbicort 2 puff twice daily  - Irritable larynx  : START e gabapentin 300m77mce daily x 5 days, then 300mg47mce daily x 5 days, then 300mg 20me times daily to continue. If this makes you too sleepy or drowsy call us andKoreae will cut your medication dosing down   Return in 4 - 8 weeks   - cough score at followup  OV .10/16/2013  Chief Complaint  Patient presents with  . Cough    follow up.  cough is 80% improved since last, but feels the Gabapentin has increased  her appetite.   Followup chronic  cough. At last visit after extensive conversation about compliance and the need to start gabapentin she did start gabapentin and she's been compliant with sinus, acid reflux and Symbicort inhaler. With these measures the cough is 80% better. Objectively as well RSI cough score is reduced at 13. However, she is reporting increased weight gain by 4 pounds and also increased appetite since starting gabapentin. Review of gabapentin side effects reveal that the incidence of this is between 1 and 3% only but she is convinced that the drug is  to blame. She does admit that gabapentin has helped her cough. Therefore she is requesting for a taper in the gabapentin dose as a trial.  Otherwise no problems. Of note, she has a 7 mm lung nodule in December 2014 and neck CT scan of the chest i needs to be done in summer 2015  REC COugh  - much better after starting gabapentin but you are having side effects. So, will make a revised plan in BOLD   -  Sinus   -  continue generic fluticasone inhaler 2 squirts each nostril daily  - continue nasal saline spray - GERD  : continue medications for this  - Airflow obstruction  Continue symbicort 2 puff twice daily  - Irritable larynx  : Continue gabapentin but reduce to 357m once a NIGHT at bedtime (instea of 3  times a day)    Return  - in July/Augt 2015  - cough part 1 score at followup  - CT scan chest for lung nodule to be done prior to followup  OV 02/13/2014  Chief Complaint  Patient presents with  . Follow-up    Pt here to review CT results. Pt states her breathing is doing well. Pt states she only gets SOB when climbing steps. Pt c/o mild cough with intermittent mucous production in morning, brown in color. Pt denies CP.     Followup chronic cough.  And lung nodule  Chronic cough: Now on gabapentin low dose qhs, ompliant with sinus, acid reflux and Symbicort inhaler. With these measures the cough is better. Objectively as well RSI cough score is  reduced at 13 but same as before. Ideally would like to get rid of it but accepts this is best possible.   OBesity: Body mass index is 44.61 kg/(m^2). CT July 2015 has fatty  Liver and I warned about rfuture cirrhosis risk. Weight  continues and is worsening. Advised to talk to MDrucilla Schmidt MD and advised low glycemic diet sheet. Can help with ge reflyux that can help cough  Nodule:had CT July 2015 chest: nodule stable since 2006.  No furhter CT needed   OV  01/06/2017  Chief Complaint  Patient presents with  . Follow-up    Pt last in 02/2014 for cough and lung nodule. Pt states she comes back today for a dry cough that has recently worsened. Pt denies chest congestion, f/c/s, CP/tightness. Pt states she has occ DOE.    Follow-up chronic cough  She returns after nearly 3 year hiatus. She says in the interim the cough actually improved further though not fully resolved but in the last few months the cough is worse associated with mild chronic sinus headache. She says in the interim sometime in the past she did see ENT and was reassured her vocal cords were normal. She continues to have chronic clearing of the throat. Current RSI cough score is 20 with major weight is coming from clearing  of the throat and a sensation of something sticking in her throat. She continues to be on Symbicort and gabapentin 600 mg at night but these are not helping.  Last CT sinus showed mild sinusitis in 2014. Last CT chest high resolution was in 2015 without any evidence of interstitial lung disease. Last pulmonary function test was in 2014 that showed obstruction   57 yo female former smoker seen for pulmonary consult 2014 for chronic cough. (~1 yr )   TEST Joya San              - July 2008 had endoscopy which showed mild gastritis. She is on proton pump inhibitor                        - esophagogram Aug 2014: IMPRESSION: Mild esophageal dysmotility.  CT chest 2006 -No embolus. Mild nodularity associated with  right lower lobe subsegmental atx;  05/2013 >Patient underwent a pulmonary function test on December 12 that showed moderate airflow obstruction with FEV1 is 1.35 L/59%. Ratio 62. No sign change BD  05/2013 CT sinus showed no acute sinus disease  01/31/2017 Follow up : Chronic cough  Patient presents for a one-month follow-up. Patient has a history of chronic cough and was treated in 2014 2015 for persistent cough. She was noted to have moderate airflow obstruction on PFT. She was treated with gabapentin for cyclical cough. Patient was not seen in the office for around 3 years. And return last month for increased cough. She was set up for a CT sinus that was clear. High resolution CT chest showed no evidence of interstitial lung disease. Stable nodules since 2014 considered with benign etiology.  CT did note hepatic steatosis. Discussed with patient. She will need to follow with her primary care physician to discuss this. If any further or additional testing is indicated.Marland Kitchen PFT today shows similar moderate airflow obstruction  FEV1 62%,ratio 65 FVC 75% , no sign BD , DLCO 75% Patient says that she is using Tessalon was some improvement in her cough. She continues to be on gabapentin at bedtime. She does have Symbicort but does not take it on a regular basis. She denies any hemoptysis, chest pain, orthopnea, PND, or increased leg swelling. Cough is probably dry in nature.   OV 04/12/2017  Chief Complaint  Patient presents with  . Follow-up    Pt states that she is still coughing some, but it has eased up and mild SOB. Denies any CP.   Follow-up chronic refractory cough -  Unexplained Obstructive lung disease   Last visit August 2018 she a=saw  nurse practitioner. High-resolution CT chest and sinus CT were clear. She was restarted on her Symbicort. Today she tells me that cough is better. However she is unable to quantify anything further. She is a poor historian. But she did indicate multiple  times that the cough is mild enough that it is not impacting her quality of life any more.I did have her do the RSI cough score and it showed a value of 11 which is an improvement from past documented below.  She continues on gabapentin at night for many years; does not  what this medication is for. She thinks it is not impacting her cougher. She is ready to try weaning off the gabapentin  OV 08/24/2017  Chief Complaint  Patient presents with  . Follow-up    Pt states her cough came back x1 month ago. Denies any SOB or CP.  Follow-up chronic cough in a patient with polypharmacy and unexplained obstructive lung disease on Symbicort  She came of gabapentin at last visit in October 2018 and then the cough is continuing to do well but in the last month or 2 cough is worse without any particular reason.  RSI cough score is shown significant worsening.  She feels that it is a lack of gabapentin that is making the cough worse.  She is wondering about seasonal allergies because she feels she is allergic to pollen.  She says she is never been allergy tested.  Previous exam nitric oxide for asthma was normal.  She says she has seen ENT Dr. Lucia Gaskins locally in the past many years ago without any specific etiology for the cough.  She is open to another referral   Dr Lorenza [place] Reflux Symptom Index (> 13-15 suggestive of LPR cough) 02/25/2013 Score for past 2 months 05/21/2013 S/p sinus, gerd and speech Rx 07/26/2013 After starting breo in additioin to abpove 09/06/2013 ssaline sinus, no nasal steroid, symbiRx, ppi+ but no neurontin 10/16/2013 After starting gabapentin  02/13/2014  01/06/2017 pon gabapentin 679m qhs 04/12/2017 3 08/24/2017 Off gabapentin  Hoarseness of problem with voice _0 Clearing  Of Throat _1 Excess throat mucus or feeling of post nasal drip _2 01 5  Difficulty swallowing food, liquid or tablets 3 1 0 0 0 0 0 0 0  Cough after eating or lying down _3 Breathing difficulties or choking episodes _4 0 0 0 0 0 0  Troublesome or annoying cough _5 0 5  Sensation of something sticking in throat or lump in throat _6 4.5 1 0 _7 Heartburn, chest pain, indigestion, or stomach acid coming up _8 0 4 2  TOTAL 33 25 21 30._9 FeNO 16 ppb 04/12/2017       has a past medical history of Allergy, Arthritis, Asthma, Chronic back pain, Chronic leg pain, Cocaine abuse ([facility], Diabetes ([facility], GERD (gastroesophageal reflux disease), Hyperlipidemia, Hypertension, Neuromuscular disorder ([place], RECTAL BLEEDING (12/09/2008), Sleep apnea, Tobacco abuse, and Uterine fibroid.   reports that she quit smoking about 19 years ago. Her smoking use included cigarettes. She has a 10.00 pack-year smoking history. she has never used smokeless tobacco.  Past Surgical History:  Procedure Laterality Date  . BACK SURGERY  2012   L5-S1 microendoscopic disectomy last surgery 06/2011  . BREAST BIOPSY     LEFT    01/19/16  . BREAST REDUCTION SURGERY  1982  . COLONOSCOPY    . HAND SURGERY     MIDDLE TRIGGER FINGER RIGHT SIDE  . RADIOACTIVE SEED GUIDED EXCISIONAL BREAST BIOPSY Left 02/18/2016   Procedure: LEFT RADIOACTIVE SEED GUIDED EXCISIONAL BREAST BIOPSY;  Surgeon: DAlphonsa Overall MD;  [facility]  Service: General;  Laterality: Left;  . TONSILLECTOMY  2008  . TOTAL ABDOMINAL HYSTERECTOMY  05/24/2007   hysterectomy  . TUBAL LIGATION    . UPPER GASTROINTESTINAL ENDOSCOPY      Allergies  Allergen Reactions  . Losartan Cough  . Lisinopril Cough    Immunization History  Administered Date(s) Administered  . Influenza Split 03/11/2011, 03/02/2012  . Influenza Whole 02/12/2010  . Influenza,inj,Quad  PF,6+ Mos 03/08/2013, 02/27/2014, 05/19/2015, 04/19/2016, 03/09/2017  . PPD Test 10/04/2010, 07/11/2011, 12/05/2012  . Pneumococcal Polysaccharide-23 01/13/2012, 07/18/2017  . Tdap 11/18/2010    Family History   Problem Relation Age of Onset  . Diabetes Father   . Heart disease Father   . Hypertension Father   . Diabetes Mother   . Cancer Mother        brain  . Hypertension Mother   . Kidney disease Sister   . Diabetes Sister   . Kidney cancer Sister   . Colon cancer Neg Hx   . Colon polyps Neg Hx   . Esophageal cancer Neg Hx   . Rectal cancer Neg Hx   . Stomach cancer Neg Hx      Current Outpatient Medications:  .  acetaminophen (TYLENOL) 500 MG tablet, Take 2 tablets (1,000 mg total) by mouth every 8 (eight) hours as needed. DO NOT take more than 6 tablets per day., Disp: 30 tablet, Rfl: 0 .  albuterol (PROAIR HFA) 108 (90 Base) MCG/ACT inhaler, Inhale 2 puffs into the lungs every 4 (four) hours as needed for wheezing or shortness of breath., Disp: 3 Inhaler, Rfl: 2 .  aspirin 81 MG EC tablet, Take 81 mg by mouth daily.  , Disp: , Rfl:  .  benzonatate (TESSALON) 100 MG capsule, TAKE 1 CAPSULE BY MOUTH THREE TIMES DAILY AS NEEDED FOR COUGH, Disp: 30 capsule, Rfl: 0 .  Blood Glucose Monitoring Suppl (ONETOUCH VERIO FLEX SYSTEM) w/Device KIT, 1 each by Does not apply route 3 (three) times daily., Disp: 1 kit, Rfl: 1 .  budesonide-formoterol (SYMBICORT) 80-4.5 MCG/ACT inhaler, Inhale 2 puffs into the lungs 2 (two) times daily as needed (shortness of breath)., Disp: 1 Inhaler, Rfl: 5 .  budesonide-formoterol (SYMBICORT) 80-4.5 MCG/ACT inhaler, Inhale 2 puffs into the lungs 2 (two) times daily., Disp: 1 Inhaler, Rfl: 5 .  calcium carbonate (CALCIUM 600) 600 MG TABS tablet, Take 600 mg by mouth., Disp: , Rfl:  .  Cetirizine HCl (ZYRTEC ALLERGY) 10 MG CAPS, Take 1 capsule by mouth daily for allergies., Disp: 30 capsule, Rfl: 0 .  clotrimazole-betamethasone (LOTRISONE) cream, Apply 1 application topically 2 (two) times daily., Disp: 30 g, Rfl: 0 .  Continuous Blood Gluc Receiver (FREESTYLE LIBRE 14 DAY READER) DEVI, 1 each by Does not apply route 4 (four) times daily., Disp: 1 Device, Rfl: 0 .   Continuous Blood Gluc Sensor (FREESTYLE LIBRE 14 DAY SENSOR) MISC, 1 each by Does not apply route 4 (four) times daily., Disp: 2 each, Rfl: 12 .  Continuous Glucose Monitor DEVI, Use as directed., Disp: 1 each, Rfl: 0 .  fluticasone (FLONASE) 50 MCG/ACT nasal spray, Place 1 spray into both nostrils daily., Disp: 16 g, Rfl: 2 .  glucose blood (ONETOUCH VERIO) test strip, Check blood sugar 3 times a day, Disp: 100 each, Rfl: 5 .  hydrochlorothiazide (HYDRODIURIL) 25 MG tablet, , Disp: , Rfl:  .  ibuprofen (ADVIL,MOTRIN) 800 MG tablet, Take 1 tablet (800 mg total) by mouth every 8 (eight) hours as needed., Disp: 40 tablet, Rfl: 0 .  Insulin Disposable Pump (V-GO 30) KIT, 1 kit by Does not apply route daily. Fill and place daily with insulin, use 4 clicks with meals and 2 clicks with snacks, Disp: 1 kit, Rfl: 12 .  Insulin Pen Needle (B-D UF III MINI PEN NEEDLES) 31G X 5 MM MISC, Use to inject insulin twice a day, Disp: 190 each, Rfl: 5 .  insulin regular human  CONCENTRATED (HUMULIN R) 500 UNIT/ML injection, Use to fill Vgo daily.  Use 3-4 clicks with breakfast, 3-4 clicks with lunch, and 4-5 clicks with dinner.  Use 2 clicks with snacks twice daily., Disp: 3 vial, Rfl: 3 .  Insulin Syringe-Needle U-100 (INSULIN SYRINGE 1CC/30GX1/2") 30G X 1/2" 1 ML MISC, Use to fill Vgo daily, Disp: 100 each, Rfl: 5 .  JANUVIA 100 MG tablet, , Disp: , Rfl:  .  liraglutide 18 MG/3ML SOPN, Inject 0.3 mLs (1.8 mg total) into the skin daily., Disp: 6 mL, Rfl: 5 .  losartan (COZAAR) 50 MG tablet, Take 50 mg by mouth., Disp: , Rfl:  .  metFORMIN (GLUCOPHAGE) 1000 MG tablet, Take 1 tablet (1,000 mg total) by mouth 2 (two) times daily with a meal., Disp: 180 tablet, Rfl: 3 .  Multiple Vitamin (MULTIVITAMIN WITH MINERALS) TABS tablet, Take 1 tablet by mouth daily., Disp: , Rfl:  .  ONETOUCH DELICA LANCETS FINE MISC, Check blood sugar 3 times a day, Disp: 100 each, Rfl: 12 .  pantoprazole (PROTONIX) 40 MG tablet, Take 1 tablet  (40 mg total) by mouth 2 (two) times daily., Disp: 180 tablet, Rfl: 0 .  potassium chloride 20 MEQ TBCR, Take 20 mEq by mouth daily., Disp: 90 tablet, Rfl: 0 .  ranitidine (ZANTAC) 300 MG tablet, Take 1 tablet (300 mg total) by mouth at bedtime., Disp: 90 tablet, Rfl: 1 .  rosuvastatin (CRESTOR) 20 MG tablet, Take 1 tablet (20 mg total) by mouth at bedtime., Disp: 90 tablet, Rfl: 3 .  acyclovir (ZOVIRAX) 400 MG tablet, Take 1 tablet (400 mg total) by mouth 3 (three) times daily as needed (flares). (Patient not taking: Reported on 08/24/2017), Disp: 90 tablet, Rfl: 3 .  cyclobenzaprine (FLEXERIL) 5 MG tablet, Take 1 tablet (5 mg total) by mouth 2 (two) times daily. (Patient not taking: Reported on 08/24/2017), Disp: 20 tablet, Rfl: 0 .  gabapentin (NEURONTIN) 300 MG capsule, Take 2 capsules (600 mg total) by mouth at bedtime. (Patient not taking: Reported on 08/24/2017), Disp: 180 capsule, Rfl: 0 .  hydrochlorothiazide (HYDRODIURIL) 25 MG tablet, Take 1 tablet (25 mg total) daily by mouth., Disp: 90 tablet, Rfl: 0 .  oxyCODONE-acetaminophen (PERCOCET/ROXICET) 5-325 MG tablet, Take 1 tablet by mouth every 8 (eight) hours as needed. For back pain. Fill prescription 30 days from last dispense date., Disp: 50 tablet, Rfl: 0 .  sucralfate (CARAFATE) 1 g tablet, Take 1 tablet (1 g total) by mouth 4 (four) times daily -  with meals and at bedtime., Disp: 28 tablet, Rfl: 0   Review of Systems     Objective:   Physical Exam  Constitutional: She is oriented to person, place, and time. She appears well-developed and well-nourished. No distress.  obese  HENT:  Head: Normocephalic and atraumatic.  Right Ear: External ear normal.  Left Ear: External ear normal.  Mouth/Throat: Oropharynx is clear and moist. No oropharyngeal exudate.  Eyes: Conjunctivae and EOM are normal. Pupils are equal, round, and reactive to light. Right eye exhibits no discharge. Left eye exhibits no discharge. No scleral icterus.  Neck:  Normal range of motion. Neck supple. No JVD present. No tracheal deviation present. No thyromegaly present.  Cardiovascular: Normal rate, regular rhythm, normal heart sounds and intact distal pulses. Exam reveals no gallop and no friction rub.  No murmur heard. Pulmonary/Chest: Effort normal and breath sounds normal. No respiratory distress. She has no wheezes. She has no rales. She exhibits no tenderness.  Abdominal: Soft. Bowel  sounds are normal. She exhibits no distension and no mass. There is no tenderness. There is no rebound and no guarding.  Musculoskeletal: Normal range of motion. She exhibits no edema or tenderness.  Lymphadenopathy:    She has no cervical adenopathy.  Neurological: She is alert and oriented to person, place, and time. She has normal reflexes. No cranial nerve deficit. She exhibits normal muscle tone. Coordination normal.  Skin: Skin is warm and dry. No rash noted. She is not diaphoretic. No erythema. No pallor.  Psychiatric: She has a normal mood and affect. Her behavior is normal. Judgment and thought content normal.  Vitals reviewed.  Vitals:   08/24/17 0922  BP: 126/74  Pulse: 96  SpO2: 100%  Weight: 258 lb 3.2 oz (117.1 kg)  Height: 5' 3.5" (1.613 m)    Estimated body mass index is 45.02 kg/m as calculated from the following:   Height as of this encounter: 5' 3.5" (1.613 m).   Weight as of this encounter: 258 lb 3.2 oz (117.1 kg).     Assessment:       ICD-10-CM   1. Chronic cough R05   2. Irritable larynx J38.7   3. Chronic obstructive pulmonary disease, unspecified COPD type ([place]) J44.9   4. Seasonal allergies J30.2        Plan:      Looks like cough worse without gabapentin - this suggests lot of your cough is due to irritable larynx or LPR cough or cough neuropathy  Possible that seasonal allergies playing a role  Plan  - do blood allergy profile with IgE  - refer to Dr Carol Ada ENT at [facility]  - continue symbicort  - hold off  gabapentin  Followuop - return in 8 -12 weeeks but after completing above; mighth have to restart gabapentin depending on outcome of above  Dr. Brand Males, M.D., [facility].C.P Pulmonary and Critical Care Medicine Staff Physician, [place] Director - Interstitial Lung Disease  Program  Pulmonary [place] [facility] [place], [place], [address]  Pager: [phone], If no answer or between  15:00h - 7:00h: call [phone] Telephone: [phone]

## 2017-08-24 NOTE — Patient Instructions (Addendum)
ICD-10-CM   1. Chronic cough R05   2. Irritable larynx J38.7   3. Chronic obstructive pulmonary disease, unspecified COPD type ([place]) J44.9   4. Seasonal allergies J30.2    Looks like cough worse without gabapentin - this suggests lot of your cough is due to irritable larynx or LPR cough or cough neuropathy  Possible that seasonal allergies playing a role  Plan  - do blood allergy profile with IgE  - refer to Dr Carol Ada E[facility] at [facility]  - continue symbicort  - hold off gabapentin  Followuop - return in 8 -12 weeeks but after completing above; mighth have to restart gabapentin depending on outcome of above

## 2017-08-25 ENCOUNTER — Encounter: Payer: Self-pay | Admitting: Dietician

## 2017-08-25 ENCOUNTER — Telehealth: Payer: Self-pay | Admitting: Dietician

## 2017-08-25 LAB — RESPIRATORY ALLERGY PROFILE REGION II ~~LOC~~
Allergen, A. alternata, m6: <0.1 kU/L
Allergen, Cedar tree, t12: <0.1 kU/L
Allergen, Comm Silver Birch, t9: <0.1 kU/L
Allergen, Cottonwood, t14: <0.1 kU/L
Allergen, D pternoyssinus,d7: <0.1 kU/L
Allergen, Mouse Urine Protein, e78: <0.1 kU/L
Allergen, Mulberry, t76: <0.1 kU/L
Allergen, P. notatum, m1: <0.1 kU/L
Bermuda Grass: <0.1 kU/L
Box Elder IgE: <0.1 kU/L
CLASS: 0
CLASS: 0
CLASS: 0
CLASS: 0
CLASS: 0
CLASS: 0
CLASS: 0
CLASS: 0
CLASS: 2
COCKROACH: 0.8 kU/L — AB
COMMON RAGWEED (SHORT) (W1) IGE: 0.41 kU/L — AB
Cat Dander: <0.1 kU/L
Class: 0
Class: 0
Class: 0
Class: 0
Class: 0
Class: 0
Class: 0
Class: 0
Class: 0
Class: 0
Class: 0
Class: 0
Class: 0
Class: 0
Class: 1
D. farinae: <0.1 kU/L
Elm IgE: <0.1 kU/L
IgE (Immunoglobulin E), Serum: 24 kU/L (ref <=114)
Johnson Grass: <0.1 kU/L
Rough Pigweed  IgE: <0.1 kU/L
Timothy Grass: <0.1 kU/L

## 2017-08-25 LAB — INTERPRETATION:

## 2017-08-25 NOTE — Telephone Encounter (Signed)
Ms. Beever calls saying that the endocrinologist Dr. Buddy Duty could not see her until November. She wonders if there is someone else we can send her to to be abel to be seen sooner. I told her I would let the referral coordinators and her physician know and they should be in touch. I confirmed her appointment with me for Monday 08/28/17.

## 2017-08-27 NOTE — Telephone Encounter (Signed)
Thanks Cleopatra!

## 2017-08-28 ENCOUNTER — Ambulatory Visit (INDEPENDENT_AMBULATORY_CARE_PROVIDER_SITE_OTHER): Payer: PPO | Admitting: Dietician

## 2017-08-28 DIAGNOSIS — G4733 Obstructive sleep apnea (adult) (pediatric): Secondary | ICD-10-CM

## 2017-08-28 DIAGNOSIS — Z794 Long term (current) use of insulin: Secondary | ICD-10-CM

## 2017-08-28 DIAGNOSIS — Z713 Dietary counseling and surveillance: Secondary | ICD-10-CM | POA: Diagnosis not present

## 2017-08-28 DIAGNOSIS — E1140 Type 2 diabetes mellitus with diabetic neuropathy, unspecified: Secondary | ICD-10-CM | POA: Diagnosis not present

## 2017-08-28 NOTE — Progress Notes (Signed)
Medical Nutrition Therapy:  Appt start time: 1430 end time:  1525 Visit # 7, 2 in 2019  Assessment:  Primary concerns today: weight loss and blood sugar control follow up.   [name] is using the VGo30 with u500 for insulin administration.  She is now changing them daily and reports it helps her to prefill 5 at a time.  She is using the freestyle libre personal CGM and a blood glucose meter.  She restarted excercise 3 days a week for an hour- doing silver sneakers class for 45 minutes and riding bike for 30 minutes . She is still trying to lose weight and is happy that it is at least stable.    Learning Readiness: ready and change in place ANTHROPOMETRICS: weight-deferred today SLEEP:need to assess at future visit MEDICATIONS: liraglutide 1.8mg  daily, metformin, u500 in a vgo30 = ~250 units of insulin daily per her report BLOOD SUGAR: average is 150 on freestyle, she had a low today with symptoms but did not recognize them as low blood sugar symptoms.    Estimated daily energy needs for weight loss 1800-2000 calories Set goal of 55 grams of fat/day to assist with lowering blood sugars and calorie intake  Progress Towards Goal(s):  No progress.   Nutritional Diagnosis:  NB-1.3 Not ready for diet/lifestyle change  As related to  working on her diabetes self care not addressed today rsolved As evidenced by her report of making changes in eating habits and her physical activity.   NC2.3 food medication interaction as related to not changing VGO daily much improved as evidenced by her high A1C.   NB 1.4 self monitoring deficit as related to as eveicen by the freestyle libre reports showing average iof 4 scans per day and missing 25% of data.    Intervention:  Nutrition education about freestyle report, prevention treatment t of acute complications.assisted her in setting reminders to scan her freestyle .  Coordination of care:Suggest trail of SGLT2inhibitor if possible and chekcing vitamins D and  B12 Teaching Method Utilized: Visual,Auditory, Hands on Handouts given during visit include: vgo30 sample Barriers to learning/adherence to lifestyle change: time, resources Demonstrated degree of understanding via:  Teach Back   Monitoring/Evaluation:  Dietary intake, exercise, meter, and body weight4 weeks Debera Lat, RD 08/28/2017 3:35 PM. .

## 2017-08-28 NOTE — Procedures (Signed)
    Patient Name: [name], [name] Date: 08/11/2017 Gender: Female D.O.B: 12-May-1961 Age (years): 57 Referring Provider: Oval Linsey Height (inches): 73 Interpreting Physician: Baird Lyons MD, ABSM Weight (lbs): 255 RPSGT: Baxter Flattery BMI: 59 MRN: [id number] Neck Size: 17.00  CLINICAL INFORMATION Sleep Study Type: NPSG  Indication for sleep study: Fatigue, Obesity, Snoring  Epworth Sleepiness Score:  6  Most recent polysomnogram dated 02/01/2015 revealed an AHI of 4.9/h and RDI of 9.0/h. SLEEP STUDY TECHNIQUE As per the AASM Manual for the Scoring of Sleep and Associated Events v2.3 (April 2016) with a hypopnea requiring 4% desaturations.  The channels recorded and monitored were frontal, central and occipital EEG, electrooculogram (EOG), submentalis EMG (chin), nasal and oral airflow, thoracic and abdominal wall motion, anterior tibialis EMG, snore microphone, electrocardiogram, and pulse oximetry.  MEDICATIONS Medications self-administered by patient taken the night of the study : none reported  SLEEP ARCHITECTURE The study was initiated at 11:06:57 PM and ended at 5:06:12 AM.  Sleep onset time was 17.6 minutes and the sleep efficiency was 68.9%%. The total sleep time was 247.6 minutes.  Stage REM latency was 311.5 minutes.  The patient spent 11.4%% of the night in stage N1 sleep, 81.4%% in stage N2 sleep, 0.0%% in stage N3 and 7.27% in REM.  Alpha intrusion was absent.  Supine sleep was 0.81%.  RESPIRATORY PARAMETERS The overall apnea/hypopnea index (AHI) was 17.7 per hour. There were 2 total apneas, including 2 obstructive, 0 central and 0 mixed apneas. There were 71 hypopneas and 8 RERAs.  The AHI during Stage REM sleep was 33.3 per hour.  AHI while supine was 120.0 per hour.  The mean oxygen saturation was 93.3%. The minimum SpO2 during sleep was 85.0%.  loud snoring was noted during this study.  CARDIAC DATA The 2 lead EKG demonstrated sinus  rhythm. The mean heart rate was 72.5 beats per minute. Other EKG findings include: None.  LEG MOVEMENT DATA The total PLMS were 0 with a resulting PLMS index of 0.0. Associated arousal with leg movement index was 0.7 .  IMPRESSIONS - Moderate obstructive sleep apnea occurred during this study (AHI = 17.7/h). - No significant central sleep apnea occurred during this study (CAI = 0.0/h). - Mild oxygen desaturation was noted during this study (Min O2 = 85.0%). - The patient snored with loud snoring volume. - No cardiac abnormalities were noted during this study. - Clinically significant periodic limb movements did not occur during sleep. No significant associated arousals.  DIAGNOSIS - Obstructive Sleep Apnea (327.23 [G47.33 ICD-10])  RECOMMENDATIONS - Recommend CPAP titration study or AutoPAP. Alternatives, including a fitted oral appliance, would be based on clinical judgment. - Positional therapy avoiding supine position during sleep. - Be careful with alcohol, sedatives and other CNS depressants that may worsen sleep apnea and disrupt normal sleep architecture. - Sleep hygiene should be reviewed to assess factors that may improve sleep quality. - Weight management and regular exercise should be initiated or continued if appropriate.  [Electronically signed] 08/28/2017 07:11 AM  Baird Lyons MD, ABSM Diplomate, American Board of Sleep Medicine   NPI: [id number]                         [place], [place] [facility]  ELECTRONICALLY SIGNED ON:  08/28/2017, 7:08 AM [place] PH: [phone]   FX: [phone] [place]

## 2017-08-28 NOTE — Patient Instructions (Signed)
Keep changing out your VGo daily as you have been the past two weeks- IT HAS MADE A BIG DIFFERENCE!!!  NOW- try to work on scanning your Freestyle libre at least 4 times every day- more is better in this case.    1- your cgm download is excellent with an average glucose of 150!! (10 more weeks  And you'll get a new a1C)  2- to prevent low blood sugars I suggest you reduce your clicks to two with the next meal after your exercise.   3- to treat high blood sugars- you can- take insulin, drink water and cut back on carbs until your blood sugar is where you want it to be

## 2017-08-30 ENCOUNTER — Other Ambulatory Visit: Payer: Self-pay | Admitting: Internal Medicine

## 2017-08-30 DIAGNOSIS — R400 Somnolence: Secondary | ICD-10-CM

## 2017-08-30 DIAGNOSIS — E1140 Type 2 diabetes mellitus with diabetic neuropathy, unspecified: Secondary | ICD-10-CM

## 2017-08-31 ENCOUNTER — Telehealth: Payer: Self-pay | Admitting: Internal Medicine

## 2017-08-31 NOTE — Telephone Encounter (Signed)
Checking on status with order

## 2017-09-11 DIAGNOSIS — J387 Other diseases of larynx: Secondary | ICD-10-CM | POA: Diagnosis not present

## 2017-09-11 DIAGNOSIS — Z888 Allergy status to other drugs, medicaments and biological substances status: Secondary | ICD-10-CM | POA: Diagnosis not present

## 2017-09-11 DIAGNOSIS — J384 Edema of larynx: Secondary | ICD-10-CM | POA: Diagnosis not present

## 2017-09-11 DIAGNOSIS — R05 Cough: Secondary | ICD-10-CM | POA: Diagnosis not present

## 2017-09-14 ENCOUNTER — Telehealth: Payer: Self-pay | Admitting: Internal Medicine

## 2017-09-14 DIAGNOSIS — G8929 Other chronic pain: Secondary | ICD-10-CM

## 2017-09-14 DIAGNOSIS — M545 Low back pain: Principal | ICD-10-CM

## 2017-09-14 NOTE — Telephone Encounter (Signed)
Patient requesting refill on pain medicine   Patient is calling haven't heard anything about sleep study results

## 2017-09-14 NOTE — Telephone Encounter (Signed)
Last rx written 2/5/ 19. Last OV 08/15/17. Next OV 11/14/17. UDS 03/27/16.

## 2017-09-18 ENCOUNTER — Other Ambulatory Visit: Payer: Self-pay | Admitting: Internal Medicine

## 2017-09-18 DIAGNOSIS — K219 Gastro-esophageal reflux disease without esophagitis: Secondary | ICD-10-CM

## 2017-09-18 NOTE — Telephone Encounter (Signed)
Tried calling patient and again I am not able to reach over the phone.  Left voicemail asking her to call back.  Sleep study showed moderate obstructive sleep apnea.  She needs to get a CPAP titration study done next.  Order has already been placed previously.

## 2017-09-18 NOTE — Telephone Encounter (Signed)
Lm to call us back. Dr. Marlowe Sax requesting triage to inform/ask patient regarding Percocet script that was done 07/18/17. Script was not filled by patient. In case script was lost or patient needs another one, Dr. Marlowe Sax is to be notified. She's not able to send it electronically. Patient will need to p/u script (preferrably tomorrow 09/19/17)

## 2017-09-18 NOTE — Telephone Encounter (Signed)
I checked the [place] controlled substance database. Prescription given during her last visit on 07/18/2017 as not been filled yet.

## 2017-09-19 ENCOUNTER — Other Ambulatory Visit: Payer: Self-pay | Admitting: *Deleted

## 2017-09-19 ENCOUNTER — Other Ambulatory Visit: Payer: Self-pay | Admitting: Internal Medicine

## 2017-09-19 DIAGNOSIS — M545 Low back pain: Principal | ICD-10-CM

## 2017-09-19 DIAGNOSIS — G8929 Other chronic pain: Secondary | ICD-10-CM

## 2017-09-19 MED ORDER — OXYCODONE-ACETAMINOPHEN 5-325 MG PO TABS: 1.0000 | ORAL_TABLET | Freq: Three times a day (TID) | ORAL | 0 refills | Status: DC | PRN

## 2017-09-19 MED ORDER — HYDROCHLOROTHIAZIDE 25 MG PO TABS: 25.0000 mg | ORAL_TABLET | Freq: Every day | ORAL | 3 refills | Status: DC

## 2017-09-19 NOTE — Telephone Encounter (Signed)
Patient called back- she states she filled last percocet Rx (written 07/18/17) & took last pill March 30th. She's requesting refill. Also stated  MD left msg yesterday about test results. She  requests MD to call her back. Informed pcp may not be in the clinic this pm & she will be updated when Rx is ready for pick up (will probably be next day or later) Verbalized understanding.

## 2017-09-19 NOTE — Telephone Encounter (Signed)
Spoke to the patient's pharmacy. They informed me that Percocet was last dispensed on 07/22/2017 for a 30 day supply. Will print another for the patient today.

## 2017-09-20 ENCOUNTER — Other Ambulatory Visit: Payer: Self-pay | Admitting: Oncology

## 2017-09-20 DIAGNOSIS — G8929 Other chronic pain: Secondary | ICD-10-CM

## 2017-09-20 DIAGNOSIS — M545 Low back pain: Principal | ICD-10-CM

## 2017-09-20 MED ORDER — OXYCODONE-ACETAMINOPHEN 5-325 MG PO TABS: 1.0000 | ORAL_TABLET | Freq: Three times a day (TID) | ORAL | 0 refills | Status: DC | PRN

## 2017-09-20 NOTE — Telephone Encounter (Signed)
Having dr Beryle Beams to send electronically, pt cannot come to pick up

## 2017-09-21 ENCOUNTER — Telehealth: Payer: Self-pay | Admitting: Internal Medicine

## 2017-09-21 NOTE — Telephone Encounter (Signed)
Patient is calling to check on prescription and no one has contacted about the sleep study

## 2017-09-22 ENCOUNTER — Telehealth: Payer: Self-pay | Admitting: Internal Medicine

## 2017-09-22 NOTE — Telephone Encounter (Signed)
Script sent to pharmacy Sending to [facility] and lela for referral to sleep study

## 2017-09-22 NOTE — Telephone Encounter (Signed)
Blood allergy profile essentiually normal except mild positive for cocroach and ragweed pollen. She has to make sure there is no roach in the house    Results for [name], [name] (MRN [id number]) as of 09/22/2017 16:43  Ref. Range 08/24/2017 10:19  Sheep Sorrel IgE Latest Units: kU/L <0.10  Pecan/Hickory Tree IgE Latest Units: kU/L <0.10  IgE (Immunoglobulin E), Serum Latest Ref Range: <OR=114 kU/L 24  Allergen, D pternoyssinus,d7 Latest Units: kU/L <0.10  Cat Dander Latest Units: kU/L <0.10  Dog Dander Latest Units: kU/L <0.10  [place] Grass Latest Units: kU/L <0.10  [place] Grass Latest Units: kU/L <0.10  Timothy Grass Latest Units: kU/L <0.10  Cockroach Latest Units: kU/L 0.80 (H)  Aspergillus fumigatus, m3 Latest Units: kU/L <0.10  Allergen, Comm Silver Wendee Copp, t9 Latest Units: kU/L <0.10  Allergen, Cottonwood, t14 Latest Units: kU/L <0.10  Elm IgE Latest Units: kU/L <0.10  Allergen, Mulberry, t76 Latest Units: kU/L <0.10  Allergen, Oak,t7 Latest Units: kU/L <0.10  COMMON RAGWEED (SHORT) (W1) IGE Latest Units: kU/L 0.41 (H)  Allergen, Mouse Urine Protein, e78 Latest Units: kU/L <0.10  D. farinae Latest Units: kU/L <0.10  Allergen, Cedar tree, t12 Latest Units: kU/L <0.10  Box Elder IgE Latest Units: kU/L <0.10  Rough Pigweed  IgE Latest Units: kU/L <0.10

## 2017-09-25 ENCOUNTER — Other Ambulatory Visit: Payer: Self-pay | Admitting: Internal Medicine

## 2017-09-25 NOTE — Telephone Encounter (Signed)
Patient returned call, CB is [phone]

## 2017-09-25 NOTE — Telephone Encounter (Signed)
Spoke with this patient.  She already has order in Epic for a Titration Study to be completed for her Sleep Study. [place] is  waiting for her [employer] to approve her for this Study before any CPAP order can be ordered.

## 2017-09-25 NOTE — Telephone Encounter (Signed)
Attempted to call pt but no answer.  Left message for pt to return call x1 

## 2017-09-26 NOTE — Telephone Encounter (Signed)
Called and spoke with patient, she verbalized understanding and states that there is no evidence of cockroaches in her home. Nothing further needed.

## 2017-09-26 NOTE — Telephone Encounter (Signed)
Pt is returning call. Cb is [phone].

## 2017-09-27 ENCOUNTER — Ambulatory Visit: Payer: PPO | Admitting: Dietician

## 2017-09-28 DIAGNOSIS — H2512 Age-related nuclear cataract, left eye: Secondary | ICD-10-CM | POA: Diagnosis not present

## 2017-10-04 ENCOUNTER — Ambulatory Visit: Payer: PPO | Admitting: Dietician

## 2017-10-05 ENCOUNTER — Ambulatory Visit (INDEPENDENT_AMBULATORY_CARE_PROVIDER_SITE_OTHER): Payer: PPO | Admitting: Dietician

## 2017-10-05 ENCOUNTER — Encounter: Payer: Self-pay | Admitting: Dietician

## 2017-10-05 DIAGNOSIS — Z713 Dietary counseling and surveillance: Secondary | ICD-10-CM

## 2017-10-05 DIAGNOSIS — E1140 Type 2 diabetes mellitus with diabetic neuropathy, unspecified: Secondary | ICD-10-CM | POA: Diagnosis not present

## 2017-10-05 DIAGNOSIS — Z794 Long term (current) use of insulin: Secondary | ICD-10-CM

## 2017-10-05 DIAGNOSIS — Z6841 Body Mass Index (BMI) 40.0 and over, adult: Secondary | ICD-10-CM

## 2017-10-05 NOTE — Progress Notes (Signed)
Medical Nutrition Therapy:  Appt start time: 0815 end time:  0935 Visit # 8, 3 in 2019  Assessment:  Primary concerns today: weight loss and blood sugar control follow up. Wants to get off some of her medications  [name] is using the VGo30 with u500 for insulin administration.  She is now changing them daily.  She is using the freestyle libre personal CGM and a blood glucose meter. She is scanning more often because of the alarms we set.  She stopped exercising because her work got busy and is eating fried foods and sweets more often. She reports readiness to change a "9", her spouse and work environment is supportive for her to make changes. She is willing to try weight loss medication ( unfortunately not covered by her insurance currently)  but is not interested in surgery.  Learning Readiness: ready and has met barriers ANTHROPOMETRICS: Estimated body mass index is 45.51 kg/m as calculated from the following:   Height as of 08/24/17: 5' 3.5" (1.613 m).   Weight as of this encounter: 261 lb (118.4 kg). weight-increased by 3# over the past month SLEEP:reports this is good and uses CPAP MEDICATIONS: liraglutide 1.86m daily, metformin, u500 in a vgo30 = ~250 units of insulin daily per her report BLOOD SUGAR:  average is 2068mdl x 14 days on freestyle libre which is 502ml higher than it was last month with 74% above 180, 26% in target and 0% below, however only capturing 77% of time, also uses meter for some readings.  she reports a low yesterday.   Estimated daily energy needs for weight loss 1800-2000 calories Set goal of 55 grams of fat/day to assist with lowering blood sugars and calorie intake  Progress Towards Goal(s):  No progress.   Nutritional Diagnosis:  NC2.3 food medication interaction as related to not changing VGO daily much improved as evidenced by her lower blood sugars.   NB 1.4 self monitoring deficit as related to not scanning frequently enough as evidenced by the freestyle  libre reports showing average of 4+ scans per day and missing 23% of data.    Intervention:  Nutrition education about freestyle report, prevention treatment t of acute complications.assisted her in setting reminders to scan her freestyle .  Coordination of care:Suggest trail of weight loss medication if covered by medicare, SGLT2inhibitor and checking vitamins D and B12, request second MNT referral in same year for continued work on lowering her high A1C.  Teaching Method Utilized: Visual,Auditory, Hands on Handouts given during visit include: vgo30 sample Barriers to learning/adherence to lifestyle change: time, resources Demonstrated degree of understanding via:  Teach Back   Monitoring/Evaluation:  Dietary intake, exercise, meter, exercise log and body weight in 4  weeks DonDebera LatD 10/05/2017 10:26 AM. .

## 2017-10-05 NOTE — Patient Instructions (Signed)
Goals for month-   Read your goals daily- fried foods and being physically active behavior changes.  See you in May!

## 2017-10-06 NOTE — Addendum Note (Signed)
Addended by: Shela Leff on: 10/06/2017 11:21 AM   Modules accepted: Orders

## 2017-10-10 ENCOUNTER — Telehealth: Payer: Self-pay | Admitting: Internal Medicine

## 2017-10-14 ENCOUNTER — Ambulatory Visit (HOSPITAL_BASED_OUTPATIENT_CLINIC_OR_DEPARTMENT_OTHER): Payer: PPO | Attending: Internal Medicine | Admitting: Internal Medicine

## 2017-10-14 VITALS — Ht 63.0 in | Wt 255.0 lb

## 2017-10-14 DIAGNOSIS — R400 Somnolence: Secondary | ICD-10-CM | POA: Diagnosis not present

## 2017-10-14 DIAGNOSIS — G4733 Obstructive sleep apnea (adult) (pediatric): Secondary | ICD-10-CM | POA: Diagnosis not present

## 2017-10-23 ENCOUNTER — Ambulatory Visit: Payer: PPO | Admitting: Internal Medicine

## 2017-10-23 ENCOUNTER — Encounter: Payer: Self-pay | Admitting: Internal Medicine

## 2017-10-23 VITALS — BP 130/74 | HR 89 | Ht 63.0 in | Wt 259.0 lb

## 2017-10-23 DIAGNOSIS — J449 Chronic obstructive pulmonary disease, unspecified: Secondary | ICD-10-CM

## 2017-10-23 DIAGNOSIS — J387 Other diseases of larynx: Secondary | ICD-10-CM

## 2017-10-23 DIAGNOSIS — R05 Cough: Secondary | ICD-10-CM | POA: Diagnosis not present

## 2017-10-23 DIAGNOSIS — R053 Chronic cough: Secondary | ICD-10-CM

## 2017-10-23 MED ORDER — BUDESONIDE-FORMOTEROL FUMARATE 80-4.5 MCG/ACT IN AERO: 2.0000 | INHALATION_SPRAY | Freq: Two times a day (BID) | RESPIRATORY_TRACT | 12 refills | Status: DC

## 2017-10-23 NOTE — Patient Instructions (Addendum)
ICD-10-CM   1. Chronic cough R05   2. Irritable larynx J38.7   3. Chronic obstructive pulmonary disease, unspecified COPD type ([place]) J44.9     Unable to establish a reason for cough This is chronic refractory cough or irritable larynx or cough neuropathy  Unable to help cough further despite gabapentin, voice rehab and referral to Dr Doug Sou  Plan  continue gabapenting Change symbicort to dulera 100/4.5 , 2 puff bid  - because dulera not covered continue symbicort Refer to Dr Clois Comber in our office for 3rd opinion I am supportive of Dr Joya Gaskins trying amityrptiline at your followup with hm  Followup Dr Melvyn Novas next first available

## 2017-10-23 NOTE — Progress Notes (Signed)
Subjective:     Patient ID: [name], female   DOB: Jan 11, 1961, 57 y.o.   MRN: [id number]  HPI  '@Patient'$  ID: [name], female    DOB: [birth year], 57 y.o.   MRN: [id number]  Chief Complaint  Patient presents with  . Follow-up    cough     Referring provider: Shela Leff, MD  HPI:   HPI   IOV 02/25/2013  57 year old nonsmoker obese female. Accompanied by her husband. Chief complaint is chronic cough.  Cough is of insidious onset a year ago. Was severe in intensity. And was progressive. Quality was a dry cough. There is associated ticklish sensation in her throat and constant clearing and gagging. In April 2014 was diagnosed to have esophageal dysmotility and in July 2014 saw Dr. Verdia Kuba who adjusted her proton pump inhibitor and change her diet. After this in the last several weeks her cough significantly improved and almost resolved. In between he esophagogram in seeing gastroenterologist Dr. Collene Mares she did see Dr. Melony Overly of ENT specialty who apparently did not see any abnormal with her vocal cords but treated her for sinusitis and this did not help her cough.   Cough relevant history   - Sinus/allergies  - Denies any problems. Never on nasal steroids. But does have some postnasal drip  - GI/reflux disease  - July 2008 had endoscopy which showed mild gastritis. She is on proton pump inhibitor  - Body mass index is 42.99 kg/(m^2).  - esophagogram Aug 2014: IMPRESSION:  Mild esophageal dysmotility.  Otherwise negative esophagram.  Original Report Authenticated By: Julian Hy, M.D.   - SAw DR Collene Mares July 2014 and PPI adjusted and Diet changed-> after this cough resolved   - Hypertension  -  She has hypertension but she is not on ACE inhibitor  - Pulmonary history  -  reports that she quit smoking about 14 years ago. Her smoking use included Cigarettes. She has a 10 pack-year smoking history. She has never used smokeless tobacco.   - CT chest  2006    -   Findings: No dissection is evident. No filling defect is identified in the pulmonary arterial tree to suggest pulmonary embolus. In the lateral basal segment of the right lower lobe there is some atelectasis adjacent to a small region of nodularity measuring 6 mm in diameter. This nodularity likely simply relates to atelectasis, but a true pulmonary nodule is difficult to exclude. I would recommend follow up limited noncontrast CT in 3 months time in order to reassess this region.  There is no hilar or mediastinal adenopathy.  IMPRESSION:  No embolus. Mild nodularity associated with right lower lobe subsegmental atx; recommend followup limited evaluation in 3 months in order to ensure that this clears or fails to progress. The main purpose of the followup is to rule out the statistically unlikely possibility that this represents early malignancy.    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  #Sinus  - continue take generic fluticasone inhaler 2 squirts each nostril daily - START 3% nasal saline spray at night 2 squietrs made by a company called [employer]  #ACid reflux  - most important reason for your cough  - folllow advice of Dr Collene Mares 100% without fail  #Irritable larynx  -2- 3 days of complete voice rest without whispering or talking - See MR CArl Schinke of  neuro rehab for speech therapy  #followup  - 4-6 weeks with me or my CMA [name] with cough score at followupo - If cough still a problem, will consider CT chest or neck and methacholine challenge test and Rx with neurontin   04/23/2013 Follow up Cough  6 week follow up - reports cough improves for a few days, then worsens again.Marland Kitchen Cough seems to wax and wane .  Not using anything to control cough .  Has sinus drip and drainage esp at night.  No fever, discolored mucus, chest pain, orthopnea or edema.  CXR 01/2013 with  no acute findings.  [name] Cough score 23 today   Begin Delsym 2 tsp Twice daily   Begin Tessalon 240m Three times a day   Begin Chlortrimeton 450m1 in am and 2 At bedtime  -may make you sleepy.  Continue on Protonix daily before meal  Continue on Zantac At bedtime   Work on not coughing or throat clearing.  Please contact office for sooner follow up if symptoms do not improve or worsen or seek emergency care  follow up Dr. RaChase Callern 4-6 weeks and As needed  OV 05/21/2013 Chief Complaint  Patient presents with  . Cough    follow-up. Pt states cough is not improved at all.   [age] female reports for followup of cough that is chronic. Since seeing me in September 2014 on the cough score her cough appears one third better but subjectively she says that her cough is unimproved. She reports compliance with a sinus treatment measures and acid reflux treatment measures. Despite attending speech therapy and following my advice and the advice of nurse practitioner a month ago cough is unimproved. She is frustrated. RSI cough score is 25 and details are below. She is open to more advanced testing    05/31/2013 Follow up  Returns for follow up and review test results . Seen last week with cough . No significant change in cough.   Patient underwent a pulmonary function test on December 12 that showed moderate airflow obstruction with FEV1 is 1.35 L/59%. Ratio 62. No sign change BD   Patient underwent CT chest on December 9 that showed mild bronchiectasis in the right middle lobe. No evidence of interstitial lung disease. There was an incidental nodule in the right lung base, measuring 7 mm.  CT sinus showed no acute sinus disease  She denies any hemoptysis, orthopnea, PND, or leg swelling.  REC Trial of Breo Inhaler 1 puff daily , brush/rinse and gargle after use.  Continue on Delsym 2 tsp Twice daily  For cough As needed   Continue on Tessalon 200m30mree times a day  As needed   Cough .  Continue use Chlortrimeton 4mg 38mn am and 2 At bedtime  -may make you sleepy.  Continue on Protonix daily before meal  Continue on Zantac At bedtime   Work on not coughing or throat clearing- use sips of water, sugarless candy. NO mints .  Please contact office for sooner follow up if symptoms do not improve or worsen or seek emergency care  follow up Dr. RamasChase Caller-6 weeks and As needed   OV 07/26/2013  Chief Complaint  Patient presents with  . Cough    follow-up. Pt states cough is same as last visti. no better.    Followup chronic cough  She says she is no better. For sinus she's on nasal spray, Flonase, last visit started on Chlor-Trimeton by my nurse practitioner.  For acid reflux she is on Protonix and Zantac. 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 RSI cough score is 21. 4 obstructive lung disease she is on Brio but this also has not helped. She's willing to try Neurontin she's been to speech therapy in the past. Neurontin made her gain weight and one of her chronic pain medications and she stopped it but has not had any adverse effects. I explained to her that Neurontin can be helpful and medications with irritable larynx or chronic cough or cyclical cough but can cause significant side effects of grogginess, fogginess, sleepiness and weight gain. She understands the side effects and is willing to give her short term trial of this  REportes good compliance but I doubt   REC Continue sinus, gerd, and inhaler therpay  - change breo to symbicort 2 puff bid  Take gabapentin 333m once daily x 5 days, then 3039mtwice daily x 5 days, then 3008mhree times daily to continue. If this makes you too sleepy or drowsy call us [facility] we will cut your medication dosing down   Return in 4 - 8 weeks   - cough score at followup   OV 09/06/2013  Chief Complaint  Patient presents with  . Cough    Reports cough has improved since last OV--Has  days it will try and come back     FU chronic cough:    She tells me she is "some" better but in reality the RSI cough score shows persistence of severe chronic cough. In terms of sinus control: She is only doing her nasal saline drops as needed. She's not doing nasal steroids and she is unclear why. In terms of obstructive lung disease: She says that she is compliant with his Symbicort although she did not know the name initially. In terms of acid reflux: She says she's compliant with the proton pump and better. In terms of irritable larynx: I advised her to start gabapentin at last visit but she does be that she showed up at the pharmacy and the pharmacist told her that in his years of experience he never saw gabapentin being prescribed for chronic cough so she decided not to treat herself with gabapentin. I asked her why she can call here and she had no answer.  I've advised her 100% compliance with treatment measures if she needs to improve. Because of her on the side effects of gabapentin extensively and after listening to this and the potential benefits based on lancet paper she is willing to give this a try   COugh  - not improved - important you follow instructions carefully, closely and 100% of the time   -  Sinus   -  take generic fluticasone inhaler 2 squirts each nostril daily  - continue nasal saline spray - GERD  : continue medications for this  - Airflow obstruction  :  symbicort 2 puff twice daily  - Irritable larynx  : START e gabapentin 300m77mce daily x 5 days, then 300mg47mce daily x 5 days, then 300mg 20me times daily to continue. If this makes you too sleepy or drowsy call us andKoreae will cut your medication dosing down   Return in 4 - 8 weeks   - cough score at followup  OV .10/16/2013  Chief Complaint  Patient presents with  . Cough    follow up.  cough is 80% improved since last, but feels the Gabapentin has increased  her appetite.   Followup chronic  cough. At last visit after extensive conversation about compliance and the need to start gabapentin she did start gabapentin and she's been compliant with sinus, acid reflux and Symbicort inhaler. With these measures the cough is 80% better. Objectively as well RSI cough score is reduced at 13. However, she is reporting increased weight gain by 4 pounds and also increased appetite since starting gabapentin. Review of gabapentin side effects reveal that the incidence of this is between 1 and 3% only but she is convinced that the drug is  to blame. She does admit that gabapentin has helped her cough. Therefore she is requesting for a taper in the gabapentin dose as a trial.  Otherwise no problems. Of note, she has a 7 mm lung nodule in December 2014 and neck CT scan of the chest i needs to be done in summer 2015  REC COugh  - much better after starting gabapentin but you are having side effects. So, will make a revised plan in BOLD   -  Sinus   -  continue generic fluticasone inhaler 2 squirts each nostril daily  - continue nasal saline spray - GERD  : continue medications for this  - Airflow obstruction  Continue symbicort 2 puff twice daily  - Irritable larynx  : Continue gabapentin but reduce to 357m once a NIGHT at bedtime (instea of 3  times a day)    Return  - in July/Augt 2015  - cough part 1 score at followup  - CT scan chest for lung nodule to be done prior to followup  OV 02/13/2014  Chief Complaint  Patient presents with  . Follow-up    Pt here to review CT results. Pt states her breathing is doing well. Pt states she only gets SOB when climbing steps. Pt c/o mild cough with intermittent mucous production in morning, brown in color. Pt denies CP.     Followup chronic cough.  And lung nodule  Chronic cough: Now on gabapentin low dose qhs, ompliant with sinus, acid reflux and Symbicort inhaler. With these measures the cough is better. Objectively as well RSI cough score is  reduced at 13 but same as before. Ideally would like to get rid of it but accepts this is best possible.   OBesity: Body mass index is 44.61 kg/(m^2). CT July 2015 has fatty  Liver and I warned about rfuture cirrhosis risk. Weight  continues and is worsening. Advised to talk to MDrucilla Schmidt MD and advised low glycemic diet sheet. Can help with ge reflyux that can help cough  Nodule:had CT July 2015 chest: nodule stable since 2006.  No furhter CT needed   OV  01/06/2017  Chief Complaint  Patient presents with  . Follow-up    Pt last in 02/2014 for cough and lung nodule. Pt states she comes back today for a dry cough that has recently worsened. Pt denies chest congestion, f/c/s, CP/tightness. Pt states she has occ DOE.    Follow-up chronic cough  She returns after nearly 3 year hiatus. She says in the interim the cough actually improved further though not fully resolved but in the last few months the cough is worse associated with mild chronic sinus headache. She says in the interim sometime in the past she did see ENT and was reassured her vocal cords were normal. She continues to have chronic clearing of the throat. Current RSI cough score is 20 with major weight is coming from clearing  of the throat and a sensation of something sticking in her throat. She continues to be on Symbicort and gabapentin 600 mg at night but these are not helping.  Last CT sinus showed mild sinusitis in 2014. Last CT chest high resolution was in 2015 without any evidence of interstitial lung disease. Last pulmonary function test was in 2014 that showed obstruction   57 yo female former smoker seen for pulmonary consult 2014 for chronic cough. (~1 yr )   TEST Joya San              - July 2008 had endoscopy which showed mild gastritis. She is on proton pump inhibitor                        - esophagogram Aug 2014: IMPRESSION: Mild esophageal dysmotility.  CT chest 2006 -No embolus. Mild nodularity associated with  right lower lobe subsegmental atx;  05/2013 >Patient underwent a pulmonary function test on December 12 that showed moderate airflow obstruction with FEV1 is 1.35 L/59%. Ratio 62. No sign change BD  05/2013 CT sinus showed no acute sinus disease  01/31/2017 Follow up : Chronic cough  Patient presents for a one-month follow-up. Patient has a history of chronic cough and was treated in 2014 2015 for persistent cough. She was noted to have moderate airflow obstruction on PFT. She was treated with gabapentin for cyclical cough. Patient was not seen in the office for around 3 years. And return last month for increased cough. She was set up for a CT sinus that was clear. High resolution CT chest showed no evidence of interstitial lung disease. Stable nodules since 2014 considered with benign etiology.  CT did note hepatic steatosis. Discussed with patient. She will need to follow with her primary care physician to discuss this. If any further or additional testing is indicated.Marland Kitchen PFT today shows similar moderate airflow obstruction  FEV1 62%,ratio 65 FVC 75% , no sign BD , DLCO 75% Patient says that she is using Tessalon was some improvement in her cough. She continues to be on gabapentin at bedtime. She does have Symbicort but does not take it on a regular basis. She denies any hemoptysis, chest pain, orthopnea, PND, or increased leg swelling. Cough is probably dry in nature.   OV 04/12/2017  Chief Complaint  Patient presents with  . Follow-up    Pt states that she is still coughing some, but it has eased up and mild SOB. Denies any CP.   Follow-up chronic refractory cough -  Unexplained Obstructive lung disease   Last visit August 2018 she a=saw  nurse practitioner. High-resolution CT chest and sinus CT were clear. She was restarted on her Symbicort. Today she tells me that cough is better. However she is unable to quantify anything further. She is a poor historian. But she did indicate multiple  times that the cough is mild enough that it is not impacting her quality of life any more.I did have her do the RSI cough score and it showed a value of 11 which is an improvement from past documented below.  She continues on gabapentin at night for many years; does not  what this medication is for. She thinks it is not impacting her cougher. She is ready to try weaning off the gabapentin  OV 08/24/2017  Chief Complaint  Patient presents with  . Follow-up    Pt states her cough came back x1 month ago. Denies any SOB or CP.  Follow-up chronic cough in a patient with polypharmacy and unexplained obstructive lung disease on Symbicort  She came of gabapentin at last visit in October 2018 and then the cough is continuing to do well but in the last month or 2 cough is worse without any particular reason.  RSI cough score is shown significant worsening.  She feels that it is a lack of gabapentin that is making the cough worse.  She is wondering about seasonal allergies because she feels she is allergic to pollen.  She says she is never been allergy tested.  Previous exam nitric oxide for asthma was normal.  She says she has seen ENT Dr. Lucia Gaskins locally in the past many years ago without any specific etiology for the cough.  She is open to another referral   OV 10/23/2017  Chief Complaint  Patient presents with  . Follow-up    Pt states her cough is about the same since last visit. Denies any SOB or CP.    Follow-up chronic refractory cough in a patient with polypharmacy and unexplained obstructive lung disease on Symbicort  At last visit in March 2019 we did allergy profile.  Her IgE was normal.  She had mild positive to cockroach and ragweed pollen but she denied any exposure to these.  I then referred her to Dr. Dennison Mascot right at [facility].  She saw him on September 11, 2017.  Review of the notes indicate that the laryngeal exam on September 11, 2017 suggested anatomical composition suggestive of  sleep apnea and a thickened epiglottis.  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  Dr. Joya Gaskins was okay with patient being on gabapentin low-dose.  If this failed he was going to try amitriptyline when she followed up with him.  In the interim her cough persists.  It is refractory.  She does not want to go to voice rehab again.  She is interested in another opinion.  RSI cough score continues to be high.     Dr Lorenza Cambridge Reflux Symptom Index (> 13-15 suggestive of LPR cough) 02/25/2013 Score for past 2 months 05/21/2013 S/p sinus, gerd and speech Rx 07/26/2013 After starting breo in additioin to abpove 09/06/2013 ssaline sinus, no nasal steroid, symbiRx, ppi+ but no neurontin 10/16/2013 After starting gabapentin  02/13/2014  01/06/2017 pon gabapentin '600mg'$  qhs 04/12/2017 3 08/24/2017 Off gabapentin 10/23/2017   Hoarseness of problem with voice '3 3 2 2 3 3 2 3 3 2  '$ Clearing  Of Throat '5 3 3 4 3 3 3 2 4 4  '$ Excess throat mucus or feeling of post nasal drip '3 4 4 5 2 2 1 '$ 01 5 3  Difficulty swallowing food, liquid or tablets '3 1 0 0 0 0 0 0 0 0 '$  Cough after eating or lying down '3 4 5 5 1 1 4 1 3 4  '$ Breathing difficulties or choking episodes '1 1 1 '$ 0 0 0 0 0 0 0  Troublesome or annoying cough '5 4 4 5 1 1 5 '$ 0 5 5  Sensation of something sticking in throat or lump in throat '5 3 4 '$ 4.5 1 0 '5 2 4 4  '$ Heartburn, chest pain, indigestion, or stomach acid coming up '5 3 1 5 2 3 '$ 0 '4 2 2  '$ TOTAL 33 25 21 30.'5 13 13 20 11 26 24    '$ FeNO 16 ppb 04/12/2017    has a past medical history of Allergy, Arthritis, Asthma, Chronic back pain, Chronic  leg pain, Cocaine abuse ([place]), Diabetes ([place]), GERD (gastroesophageal reflux disease), Hyperlipidemia, Hypertension, Neuromuscular disorder ([place]), RECTAL BLEEDING (12/09/2008), Sleep apnea, Tobacco abuse, and Uterine fibroid.   reports that she quit smoking about 19 years ago. Her smoking use included cigarettes. She has a 10.00 pack-year smoking history. She has never  used smokeless tobacco.  Past Surgical History:  Procedure Laterality Date  . BACK SURGERY  2012   L5-S1 microendoscopic disectomy last surgery 06/2011  . BREAST BIOPSY     LEFT    01/19/16  . BREAST REDUCTION SURGERY  1982  . COLONOSCOPY    . HAND SURGERY     MIDDLE TRIGGER FINGER RIGHT SIDE  . RADIOACTIVE SEED GUIDED EXCISIONAL BREAST BIOPSY Left 02/18/2016   Procedure: LEFT RADIOACTIVE SEED GUIDED EXCISIONAL BREAST BIOPSY;  Surgeon: Alphonsa Overall, MD;  Location: [facility];  Service: General;  Laterality: Left;  . TONSILLECTOMY  2008  . TOTAL ABDOMINAL HYSTERECTOMY  05/24/2007   hysterectomy  . TUBAL LIGATION    . UPPER GASTROINTESTINAL ENDOSCOPY      Allergies  Allergen Reactions  . Losartan Cough  . Lisinopril Cough    Immunization History  Administered Date(s) Administered  . Influenza Split 03/11/2011, 03/02/2012  . Influenza Whole 02/12/2010  . Influenza,inj,Quad PF,6+ Mos 03/08/2013, 02/27/2014, 05/19/2015, 04/19/2016, 03/09/2017  . PPD Test 10/04/2010, 07/11/2011, 12/05/2012  . Pneumococcal Polysaccharide-23 01/13/2012, 07/18/2017  . Tdap 11/18/2010    Family History  Problem Relation Age of Onset  . Diabetes Father   . Heart disease Father   . Hypertension Father   . Diabetes Mother   . Cancer Mother        brain  . Hypertension Mother   . Kidney disease Sister   . Diabetes Sister   . Kidney cancer Sister   . Colon cancer Neg Hx   . Colon polyps Neg Hx   . Esophageal cancer Neg Hx   . Rectal cancer Neg Hx   . Stomach cancer Neg Hx      Current Outpatient Medications:  .  acetaminophen (TYLENOL) 500 MG tablet, Take 2 tablets (1,000 mg total) by mouth every 8 (eight) hours as needed. DO NOT take more than 6 tablets per day., Disp: 30 tablet, Rfl: 0 .  albuterol (PROAIR HFA) 108 (90 Base) MCG/ACT inhaler, Inhale 2 puffs into the lungs every 4 (four) hours as needed for wheezing or shortness of breath., Disp: 3 Inhaler, Rfl: 2 .  aspirin 81 MG EC tablet, Take  81 mg by mouth daily.  , Disp: , Rfl:  .  benzonatate (TESSALON) 100 MG capsule, TAKE 1 CAPSULE BY MOUTH THREE TIMES DAILY AS NEEDED FOR COUGH, Disp: 30 capsule, Rfl: 0 .  Blood Glucose Monitoring Suppl (ONETOUCH VERIO FLEX SYSTEM) w/Device KIT, 1 each by Does not apply route 3 (three) times daily., Disp: 1 kit, Rfl: 1 .  budesonide-formoterol (SYMBICORT) 80-4.5 MCG/ACT inhaler, Inhale 2 puffs into the lungs 2 (two) times daily as needed (shortness of breath)., Disp: 1 Inhaler, Rfl: 5 .  calcium carbonate (CALCIUM 600) 600 MG TABS tablet, Take 600 mg by mouth., Disp: , Rfl:  .  clotrimazole-betamethasone (LOTRISONE) cream, Apply 1 application topically 2 (two) times daily., Disp: 30 g, Rfl: 0 .  Continuous Blood Gluc Receiver (FREESTYLE LIBRE 14 DAY READER) DEVI, 1 each by Does not apply route 4 (four) times daily., Disp: 1 Device, Rfl: 0 .  Continuous Blood Gluc Sensor (FREESTYLE LIBRE 14 DAY SENSOR) MISC, 1 each by Does not apply  route 4 (four) times daily., Disp: 2 each, Rfl: 12 .  Continuous Glucose Monitor DEVI, Use as directed., Disp: 1 each, Rfl: 0 .  fluticasone (FLONASE) 50 MCG/ACT nasal spray, USE 1 SPRAY(S) IN EACH NOSTRIL ONCE DAILY, Disp: 16 g, Rfl: 3 .  gabapentin (NEURONTIN) 300 MG capsule, Take by mouth., Disp: , Rfl:  .  glucose blood (ONETOUCH VERIO) test strip, Check blood sugar 3 times a day, Disp: 100 each, Rfl: 5 .  hydrochlorothiazide (HYDRODIURIL) 25 MG tablet, Take 1 tablet (25 mg total) by mouth daily., Disp: 90 tablet, Rfl: 3 .  Insulin Disposable Pump (V-GO 30) KIT, 1 kit by Does not apply route daily. Fill and place daily with insulin, use 4 clicks with meals and 2 clicks with snacks, Disp: 1 kit, Rfl: 12 .  Insulin Pen Needle (B-D UF III MINI PEN NEEDLES) 31G X 5 MM MISC, Use to inject insulin twice a day, Disp: 190 each, Rfl: 5 .  insulin regular human CONCENTRATED (HUMULIN R) 500 UNIT/ML injection, Use to fill Vgo daily.  Use 3-4 clicks with breakfast, 3-4 clicks with  lunch, and 4-5 clicks with dinner.  Use 2 clicks with snacks twice daily., Disp: 3 vial, Rfl: 3 .  Insulin Syringe-Needle U-100 (INSULIN SYRINGE 1CC/30GX1/2") 30G X 1/2" 1 ML MISC, Use to fill Vgo daily, Disp: 100 each, Rfl: 5 .  JANUVIA 100 MG tablet, , Disp: , Rfl:  .  metFORMIN (GLUCOPHAGE) 1000 MG tablet, Take 1 tablet (1,000 mg total) by mouth 2 (two) times daily with a meal., Disp: 180 tablet, Rfl: 3 .  Multiple Vitamin (MULTIVITAMIN WITH MINERALS) TABS tablet, Take 1 tablet by mouth daily., Disp: , Rfl:  .  olopatadine (PATANOL) 0.1 % ophthalmic solution, , Disp: , Rfl: 12 .  ONETOUCH DELICA LANCETS FINE MISC, Check blood sugar 3 times a day, Disp: 100 each, Rfl: 12 .  pantoprazole (PROTONIX) 40 MG tablet, TAKE 1 TABLET BY MOUTH TWICE DAILY, Disp: 180 tablet, Rfl: 0 .  potassium chloride 20 MEQ TBCR, Take 20 mEq by mouth daily., Disp: 90 tablet, Rfl: 0 .  ranitidine (ZANTAC) 300 MG tablet, Take 1 tablet (300 mg total) by mouth at bedtime., Disp: 90 tablet, Rfl: 1 .  VICTOZA 18 MG/3ML SOPN, INJECT 0.3 MILLILITER (1.'8MG'$ ) SUBCUTANEOUSLY TO  SKIN  ONCE  DAILY, Disp: 36 pen, Rfl: 0 .  acyclovir (ZOVIRAX) 400 MG tablet, Take 1 tablet (400 mg total) by mouth 3 (three) times daily as needed (flares). (Patient not taking: Reported on 08/24/2017), Disp: 90 tablet, Rfl: 3 .  gabapentin (NEURONTIN) 300 MG capsule, Take 2 capsules (600 mg total) by mouth at bedtime. (Patient not taking: Reported on 08/24/2017), Disp: 180 capsule, Rfl: 0 .  hydrochlorothiazide (HYDRODIURIL) 25 MG tablet, Take 1 tablet (25 mg total) daily by mouth., Disp: 90 tablet, Rfl: 0 .  oxyCODONE-acetaminophen (PERCOCET/ROXICET) 5-325 MG tablet, Take 1 tablet by mouth every 8 (eight) hours as needed. For back pain. Fill prescription 30 days from last dispense date., Disp: 50 tablet, Rfl: 0 .  rosuvastatin (CRESTOR) 20 MG tablet, Take 1 tablet (20 mg total) by mouth at bedtime., Disp: 90 tablet, Rfl: 3 .  sucralfate (CARAFATE) 1 g tablet,  Take 1 tablet (1 g total) by mouth 4 (four) times daily -  with meals and at bedtime., Disp: 28 tablet, Rfl: 0   Review of Systems     Objective:   Physical Exam  Constitutional: She is oriented to person, place, and time. She  appears well-developed and well-nourished. No distress.  obese  HENT:  Head: Normocephalic and atraumatic.  Right Ear: External ear normal.  Left Ear: External ear normal.  Mouth/Throat: Oropharynx is clear and moist. No oropharyngeal exudate.  Eyes: Pupils are equal, round, and reactive to light. Conjunctivae and EOM are normal. Right eye exhibits no discharge. Left eye exhibits no discharge. No scleral icterus.  Neck: Normal range of motion. Neck supple. No JVD present. No tracheal deviation present. No thyromegaly present.  Cardiovascular: Normal rate, regular rhythm, normal heart sounds and intact distal pulses. Exam reveals no gallop and no friction rub.  No murmur heard. Pulmonary/Chest: Effort normal and breath sounds normal. No respiratory distress. She has no wheezes. She has no rales. She exhibits no tenderness.  Abdominal: Soft. Bowel sounds are normal. She exhibits no distension and no mass. There is no tenderness. There is no rebound and no guarding.  Musculoskeletal: Normal range of motion. She exhibits no edema or tenderness.  Lymphadenopathy:    She has no cervical adenopathy.  Neurological: She is alert and oriented to person, place, and time. She has normal reflexes. No cranial nerve deficit. She exhibits normal muscle tone. Coordination normal.  Skin: Skin is warm and dry. No rash noted. She is not diaphoretic. No erythema. No pallor.  Psychiatric: She has a normal mood and affect. Her behavior is normal. Judgment and thought content normal.  Vitals reviewed.  Vitals:   10/23/17 0910  BP: 130/74  Pulse: 89  SpO2: 98%  Weight: 259 lb (117.5 kg)  Height: '5\' 3"'$  (1.6 m)    Estimated body mass index is 45.88 kg/m as calculated from the  following:   Height as of this encounter: '5\' 3"'$  (1.6 m).   Weight as of this encounter: 259 lb (117.5 kg).      Assessment:       ICD-10-CM   1. Chronic cough R05   2. Irritable larynx J38.7   3. Chronic obstructive pulmonary disease, unspecified COPD type ([place]) J44.9        Plan:       Unable to establish a reason for cough This is chronic refractory cough or irritable larynx or cough neuropathy  Unable to help cough further despite gabapentin, voice rehab and referral to Dr Doug Sou  Plan  continue gabapenting Change symbicort to dulera 100/4.5 , 2 puff bid  - because dulera not covered continue symbicort Refer to Dr Clois Comber in our office for 3rd opinion I am supportive of Dr Joya Gaskins trying amityrptiline at your followup with hm  Followup Dr Melvyn Novas next first available    (> 50% of this 15 min visit spent in face to face counseling or/and coordination of care)   Dr. Brand Males, M.D., [facility].C.P Pulmonary and Critical Care Medicine Staff Physician, [place] Director - Interstitial Lung Disease  Program  Pulmonary [place] at [place], [place], [address]  Pager: [phone], If no answer or between  15:00h - 7:00h: call [phone] Telephone: [phone]

## 2017-10-24 ENCOUNTER — Ambulatory Visit: Payer: PPO | Admitting: Podiatry

## 2017-10-30 DIAGNOSIS — G4733 Obstructive sleep apnea (adult) (pediatric): Secondary | ICD-10-CM | POA: Diagnosis not present

## 2017-10-30 NOTE — Procedures (Signed)
  Patient Name: [name], Lohn Date: 10/14/2017 Gender: Female D.O.B: 14-Jan-1961 Age (years): 57 Referring Provider: Oval Linsey Height (inches): 63 Interpreting Physician: Baird Lyons MD, ABSM Weight (lbs): 255 RPSGT: Lanae Boast BMI: 45 MRN: [id number] Neck Size: 17.00  CLINICAL INFORMATION The patient is referred for a CPAP titration to treat sleep apnea. Date of NPSG, Split Night or HST:  08/11/17 NPSG   AHI 17.7/ hr, desaturation to 85%, body weight 255 lbs.  SLEEP STUDY TECHNIQUE As per the AASM Manual for the Scoring of Sleep and Associated Events v2.3 (April 2016) with a hypopnea requiring 4% desaturations.  The channels recorded and monitored were frontal, central and occipital EEG, electrooculogram (EOG), submentalis EMG (chin), nasal and oral airflow, thoracic and abdominal wall motion, anterior tibialis EMG, snore microphone, electrocardiogram, and pulse oximetry. Continuous positive airway pressure (CPAP) was initiated at the beginning of the study and titrated to treat sleep-disordered breathing.  MEDICATIONS Medications self-administered by patient taken the night of the study : none reported  TECHNICIAN COMMENTS Comments added by technician: Patient tolerated CPAP well Comments added by scorer: N/A  RESPIRATORY PARAMETERS Optimal PAP Pressure (cm): 16 AHI at Optimal Pressure (/hr): 0 Overall Minimal O2 (%): 80.0 Supine % at Optimal Pressure (%): N/A Minimal O2 at Optimal Pressure (%): 91.0   SLEEP ARCHITECTURE The study was initiated at 11:11:17 PM and ended at 5:30:24 AM.  Sleep onset time was 25.7 minutes and the sleep efficiency was 84.3%%. The total sleep time was 319.5 minutes.  The patient spent 7.0%% of the night in stage N1 sleep, 73.7%% in stage N2 sleep, 0.0%% in stage N3 and 19.25% in REM.Stage REM latency was 94.5 minutes  Wake after sleep onset was 33.9. Alpha intrusion was absent. Supine sleep was 100.00%.  CARDIAC DATA The 2  lead EKG demonstrated sinus rhythm. The mean heart rate was 77.3 beats per minute. Other EKG findings include: None.  LEG MOVEMENT DATA The total Periodic Limb Movements of Sleep (PLMS) were 0. The PLMS index was 0.0. A PLMS index of <15 is considered normal in adults.  IMPRESSIONS - Optimal CPAP pressure 16 cwp. - Central sleep apnea was not noted during this titration (CAI = 0.4/h). - Minimal O2 saturation at CPAP 16 was 91.0%. - Snoring was prevented at final CPAP. - No cardiac abnormalities were observed during this study. - Clinically significant periodic limb movements were not noted during this study. Arousals associated with PLMs were rare.  DIAGNOSIS - Obstructive Sleep Apnea (327.23 [G47.33 ICD-10])  RECOMMENDATIONS - Recommend fixed CPAP 16 Flex 2, or DME autopap range 10-20. Patient wore a medium RESMED AirFit F20 full-face mask, with heated humidity.. - Sleep hygiene should be reviewed to assess factors that may improve sleep quality. - Weight management and regular exercise should be initiated or continued.  [Electronically signed] 10/30/2017 08:39 AM  Baird Lyons MD, ABSM Diplomate, American Board of Sleep Medicine   NPI: [id number]                          [facility]  ELECTRONICALLY SIGNED ON:  10/30/2017, 8:32 AM [place] PH: [phone]   FX: [phone] [place]

## 2017-10-31 NOTE — Addendum Note (Signed)
Addended by: Shela Leff on: 10/31/2017 05:38 PM   Modules accepted: Orders

## 2017-11-02 ENCOUNTER — Ambulatory Visit: Payer: PPO | Admitting: Dietician

## 2017-11-07 ENCOUNTER — Encounter: Payer: Self-pay | Admitting: Internal Medicine

## 2017-11-14 ENCOUNTER — Encounter: Payer: Self-pay | Admitting: Dietician

## 2017-11-14 ENCOUNTER — Encounter: Payer: Self-pay | Admitting: Internal Medicine

## 2017-11-14 ENCOUNTER — Other Ambulatory Visit: Payer: Self-pay

## 2017-11-14 ENCOUNTER — Ambulatory Visit (INDEPENDENT_AMBULATORY_CARE_PROVIDER_SITE_OTHER): Payer: PPO | Admitting: Dietician

## 2017-11-14 ENCOUNTER — Ambulatory Visit (INDEPENDENT_AMBULATORY_CARE_PROVIDER_SITE_OTHER): Payer: PPO | Admitting: Internal Medicine

## 2017-11-14 ENCOUNTER — Other Ambulatory Visit: Payer: Self-pay | Admitting: *Deleted

## 2017-11-14 VITALS — BP 141/77 | HR 88 | Temp 98.4°F | Ht 63.5 in | Wt 260.7 lb

## 2017-11-14 DIAGNOSIS — Z6841 Body Mass Index (BMI) 40.0 and over, adult: Secondary | ICD-10-CM | POA: Diagnosis not present

## 2017-11-14 DIAGNOSIS — Z79891 Long term (current) use of opiate analgesic: Secondary | ICD-10-CM | POA: Diagnosis not present

## 2017-11-14 DIAGNOSIS — I10 Essential (primary) hypertension: Secondary | ICD-10-CM

## 2017-11-14 DIAGNOSIS — E1140 Type 2 diabetes mellitus with diabetic neuropathy, unspecified: Secondary | ICD-10-CM

## 2017-11-14 DIAGNOSIS — G4762 Sleep related leg cramps: Secondary | ICD-10-CM | POA: Diagnosis not present

## 2017-11-14 DIAGNOSIS — M549 Dorsalgia, unspecified: Secondary | ICD-10-CM

## 2017-11-14 DIAGNOSIS — Z794 Long term (current) use of insulin: Secondary | ICD-10-CM | POA: Diagnosis not present

## 2017-11-14 DIAGNOSIS — G8929 Other chronic pain: Secondary | ICD-10-CM

## 2017-11-14 DIAGNOSIS — Z713 Dietary counseling and surveillance: Secondary | ICD-10-CM

## 2017-11-14 DIAGNOSIS — M545 Low back pain: Principal | ICD-10-CM

## 2017-11-14 DIAGNOSIS — Z79899 Other long term (current) drug therapy: Secondary | ICD-10-CM | POA: Diagnosis not present

## 2017-11-14 LAB — POCT GLYCOSYLATED HEMOGLOBIN (HGB A1C): HEMOGLOBIN A1C: 8.4 % — AB (ref 4.0–5.6)

## 2017-11-14 LAB — GLUCOSE, CAPILLARY: Glucose-Capillary: 179 mg/dL — ABNORMAL HIGH (ref 65–99)

## 2017-11-14 MED ORDER — CANAGLIFLOZIN-METFORMIN HCL 50-1000 MG PO TABS: 50.0000 mg | ORAL_TABLET | Freq: Two times a day (BID) | ORAL | 1 refills | Status: DC

## 2017-11-14 MED ORDER — IRBESARTAN 150 MG PO TABS: 150.0000 mg | ORAL_TABLET | Freq: Every day | ORAL | 1 refills | Status: DC

## 2017-11-14 NOTE — Telephone Encounter (Signed)
Called pt, informed her eprescribe is not working and dr Marlowe Sax will send in am

## 2017-11-14 NOTE — Assessment & Plan Note (Signed)
This problem is chronic and stable.  Patient is requesting a refill on Percocet as it helps alleviate her back pain and helps her function better.  Spoke to [employer], Percocet 50 tablets were last dispensed on September 20, 2017.  Plan -Percocet 5-325 mg every 8 hours as needed, #50 tablets

## 2017-11-14 NOTE — Patient Instructions (Addendum)
For diabetes:  Continue using insulin as before.   Start taking canagliflozin-metformin combination pill as instructed.   Stop taking metformin only pill.  Continue using Victoza as instructed.  I would like to encourage you to eat healthy and exercise.   For high blood pressure:   Start taking irbesartan 150 mg once daily as instructed.  Continue taking hydrochlorothiazide and potassium supplement as instructed.   Return for blood pressure recheck in 4-6 weeks.

## 2017-11-14 NOTE — Progress Notes (Signed)
Medical Nutrition Therapy:  Appt start time: 215 end time:  240 Visit # 4th in 2019  Assessment:  Primary concerns today: weight loss and blood sugar control follow up.   [name] is using the VGo30 with u500 for insulin administration.  She is changing them daily.  She is using the freestyle libre personal CGM and a blood glucose meter. She bought a fit bit and has been trying to move more desote her work being very busy. She has only eaten fried foods once in the past month.  A1C is improved, weight is fairly stable.  ANTHROPOMETRICS: Estimated body mass index is 45.46 kg/m as calculated from the following:   Height as of an earlier encounter on 11/14/17: 5' 3.5" (1.613 m).   Weight as of an earlier encounter on 11/14/17: 260 lb 11.2 oz (118.3 kg).  Wt Readings from Last 3 Encounters:  11/14/17 260 lb 11.2 oz (118.3 kg)  10/23/17 259 lb (117.5 kg)  10/14/17 255 lb (115.7 kg)    MEDICATIONS: liraglutide 1.8mg  daily, metformin, u500 in a vgo30 = ~250 units of insulin daily per her report BLOOD SUGAR:   Lab Results  Component Value Date   HGBA1C 8.4 (A) 11/14/2017    Estimated daily energy needs for weight loss 1800-2000 calories Set goal of 55 grams of fat/day to assist with lowering blood sugars and calorie intake  Progress Towards Goal(s):  No progress.   Nutritional Diagnosis:  NC2.3 food medication interaction as related to not changing VGO daily much resolved as evidenced by her lower blood sugars.   NB 1.4 self monitoring deficit as related to not scanning frequently enough as evidenced by the freestyle libre reports showing average of 4+ scans per day and missing 23% of data.    Intervention:  Nutrition education about freestyle report, prevention treatment t of acute complications.assisted her in setting reminders to scan her freestyle .  Coordination of care:Suggest checking vitamins D and B12.  Teaching Method Utilized: Visual,Auditory, Hands on Handouts given during visit  include: vgo30 sample Barriers to learning/adherence to lifestyle change: time, resources Demonstrated degree of understanding via:  Teach Back   Monitoring/Evaluation:  Dietary intake, exercise, meter, exercise log and body weight in 4  weeks Debera Lat, RD 11/14/2017 3:56 PM. .

## 2017-11-14 NOTE — Progress Notes (Signed)
   CC: Diabetes follow-up  HPI:  [name] is a 57 y.o. female with a past medical history of conditions listed below presenting to the clinic for a follow-up of diabetes.  Hypertension was also discussed. Please see problem based charting for the status of the patient's current and chronic medical conditions.   Past Medical History:  Diagnosis Date  . Allergy   . Arthritis    back   . Asthma    AS CHILD  . Chronic back pain   . Chronic leg pain    due to back pain  . 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 GERD (gastroesophageal reflux disease)   . Hyperlipidemia   . Hypertension   . Neuromuscular disorder ([facility])    neuropathy  . RECTAL BLEEDING 12/09/2008   Annotation: s/p EGD 7/08 mild gastritis, s/p colonoscopy 7/08- benign polyp  s/p polypectomy and isolated diverticulum.  Qualifier: Diagnosis of  By: Ditzler RN, Debra    . Sleep apnea    CPAP    YEARS AGO DONE 1/2 YEARS AGO AND WAS TOLD DID NOT HAVE  . Tobacco abuse   . Uterine fibroid    s/p hysterectomy   Review of Systems: Pertinent positives mentioned in HPI. Remainder of all ROS negative.   Physical Exam:  Vitals:   11/14/17 1322 11/14/17 1407  BP: (!) 144/76 (!) 141/77  Pulse: 98 88  Temp: 98.4 F (36.9 C)   TempSrc: Oral   SpO2: 96%   Weight: 260 lb 11.2 oz (118.3 kg)   Height: 5' 3.5" (1.613 m)    Physical Exam  Constitutional: She is oriented to person, place, and time. She appears well-developed and well-nourished. No distress.  HENT:  Mouth/Throat: Oropharynx is clear and moist.  Eyes: Right eye exhibits no discharge. Left eye exhibits no discharge.  Cardiovascular: Normal rate, regular rhythm and intact distal pulses.  Pulmonary/Chest: Effort normal and breath sounds normal. No respiratory distress. She has no wheezes. She has no rales.  Abdominal: Soft. Bowel sounds are normal. She exhibits no distension. There is no tenderness.  Musculoskeletal: She  exhibits no edema.  Neurological: She is alert and oriented to person, place, and time.  Skin: Skin is warm and dry.    Assessment & Plan:   See Encounters Tab for problem based charting.  Patient discussed with Dr. Lynnae January

## 2017-11-14 NOTE — Assessment & Plan Note (Addendum)
Uncontrolled.  Initial blood pressure 144/76 and repeat 141/77 at this visit.  She is currently taking hydrochlorothiazide 25 mg daily with potassium 20 mEq supplement daily.  Reports having muscle cramps at night.  Plan -BMP to check potassium level -Continue hydrochlorothiazide and potassium supplement -Start irbesartan 150 mg daily.  Check urine microalbumin to creatinine ratio (she has type II diabetes).  -Return to the clinic in 4 to 6 weeks for blood pressure recheck

## 2017-11-14 NOTE — Patient Instructions (Signed)
Keep reading yor goals daily  You are doing a good job with being more active despite your heavy work load!  Check/scan blood sugar more when you start the new medicine  Schedule a follow up in 3-4 weeks.   Butch Penny

## 2017-11-14 NOTE — Assessment & Plan Note (Addendum)
[name] wore the CGM for 14 days. The average reading was 182, % time in target was 49, % time below target was 0, and % time above target was 51.  Most of the high readings are overnight.  She is currently using Vgo 30 with X-505 insulin - 3 clicks 3 times a day with meals and 2 clicks once or twice a day with snacks.  Reports having CBG 79 a few days ago which was accompanied by hypoglycemic symptoms.  States she took insulin at that time but forgot to eat as she was busy.  She is also a currently using Victoza 1.8 mg daily and taking metformin 1000 mg twice daily.  Plan: Intervention will be to continue the same dose of insulin.  Continue Victoza.  Start on an SGLT2 inhibitor and metformin combination (canagliflozin-metformin 50-1000 mg twice daily).  Discussed healthy eating and exercise. Emphasized the importance of weight loss.  Patient is currently being followed by Butch Penny as well and has an appointment today.  Repeat A1c in 3 months.  If she continues to have poor glycemic control in the future, consider increasing the dose of evening insulin to help control overnight hyperglycemia.

## 2017-11-15 ENCOUNTER — Ambulatory Visit: Admit: 2017-11-15 | Payer: PPO | Admitting: Obstetrics and Gynecology

## 2017-11-15 LAB — BMP8+ANION GAP
ANION GAP: 16 mmol/L (ref 10.0–18.0)
BUN/Creatinine Ratio: 12 (ref 9–23)
BUN: 9 mg/dL (ref 6–24)
CALCIUM: 10.5 mg/dL — AB (ref 8.7–10.2)
CO2: 27 mmol/L (ref 20–29)
CREATININE: 0.74 mg/dL (ref 0.57–1.00)
Chloride: 97 mmol/L (ref 96–106)
GFR calc Af Amer: 104 mL/min/{1.73_m2} (ref >59)
GFR, EST NON AFRICAN AMERICAN: 90 mL/min/{1.73_m2} (ref >59)
Glucose: 189 mg/dL — ABNORMAL HIGH (ref 65–99)
POTASSIUM: 4.4 mmol/L (ref 3.5–5.2)
Sodium: 140 mmol/L (ref 134–144)

## 2017-11-15 LAB — MICROALBUMIN / CREATININE URINE RATIO: Creatinine, Urine: 28.4 mg/dL

## 2017-11-15 SURGERY — URETHROPEXY, USING TRANSVAGINAL TAPE
Anesthesia: Choice

## 2017-11-15 MED ORDER — OXYCODONE-ACETAMINOPHEN 5-325 MG PO TABS: 1.0000 | ORAL_TABLET | Freq: Three times a day (TID) | ORAL | 0 refills | Status: DC | PRN

## 2017-11-16 ENCOUNTER — Ambulatory Visit (INDEPENDENT_AMBULATORY_CARE_PROVIDER_SITE_OTHER)
Admission: RE | Admit: 2017-11-16 | Discharge: 2017-11-16 | Disposition: A | Discharge status: Home / Self Care | Payer: PPO | Source: Ambulatory Visit | Attending: Internal Medicine | Admitting: Internal Medicine

## 2017-11-16 ENCOUNTER — Encounter: Payer: Self-pay | Admitting: Internal Medicine

## 2017-11-16 ENCOUNTER — Ambulatory Visit: Payer: PPO | Admitting: Internal Medicine

## 2017-11-16 VITALS — BP 138/84 | HR 97 | Ht 64.0 in | Wt 256.0 lb

## 2017-11-16 DIAGNOSIS — J449 Chronic obstructive pulmonary disease, unspecified: Secondary | ICD-10-CM

## 2017-11-16 DIAGNOSIS — R05 Cough: Secondary | ICD-10-CM | POA: Diagnosis not present

## 2017-11-16 DIAGNOSIS — R053 Chronic cough: Secondary | ICD-10-CM

## 2017-11-16 DIAGNOSIS — R0602 Shortness of breath: Secondary | ICD-10-CM | POA: Diagnosis not present

## 2017-11-16 LAB — NITRIC OXIDE: Nitric Oxide: 12

## 2017-11-16 MED ORDER — ESOMEPRAZOLE MAGNESIUM 20 MG PO CPDR: DELAYED_RELEASE_CAPSULE | ORAL | 2 refills | Status: DC

## 2017-11-16 NOTE — Assessment & Plan Note (Signed)
Body mass index is 43.94 kg/m.    Lab Results  Component Value Date   TSH 1.36 07/27/2016     Contributing to gerd risk/ doe/reviewed the need and the process to achieve and maintain neg calorie balance > defer f/u primary care including intermittently monitoring thyroid status

## 2017-11-16 NOTE — Progress Notes (Signed)
LMTCB

## 2017-11-16 NOTE — Assessment & Plan Note (Signed)
Quit smoking 2/200 PFTs  May 24, 2013  that showed moderate airflow obstruction with FEV1 is 1.35 L/59%. Ratio 62. No sign change BD  - Spirometry 11/16/2017  FEV1 1.33 (62%)  Ratio 69 p am symbicort  - 11/16/2017  After extensive coaching inhaler device  effectiveness =    90%    When respiratory symptoms begin or become refractory well after a patient reports complete smoking cessation,  Especially when this wasn't the case while they were smoking, a red flag is raised based on the work of Dr Kris Mouton which states:  if you quit smoking when your best day FEV1 is still well preserved it is highly unlikely you will progress to severe disease.  That is to say, once the smoking stops,  the symptoms should not suddenly erupt or markedly worsen.  If so, the differential diagnosis should include  obesity/deconditioning,  LPR/Reflux/Aspiration syndromes,  occult CHF, or  especially side effect of medications commonly used in this population.    Not really clear she needs any inhalers as maint as if there is asthma component it is very mild and likely not related to uacs, which can be aggravated by ics  Based on the study from NEJM  378; 20 p 1865 (2018) in pts with mild asthma it is reasonable to use low dose symbicort eg 80 2bid "prn" flare in this setting but I emphasized this was only shown with symbicort and takes advantage of the rapid onset of action but is not the same as "rescue therapy" but can be stopped once the acute symptoms have resolved and the need for rescue has been minimized (< 2 x weekly)

## 2017-11-16 NOTE — Progress Notes (Signed)
Subjective:     Patient ID: [name], female   DOB: [birth year],  MRN: [id number]  HPI  46 yobf quit smoking 07/1998 says born with asthma remembers freq er trips and allergy eval by Dr Bernita Buffy on shots with reduction in need for "the pump" for breathing / some better after quit smoking then  referred to pulmonary clinic 11/16/2017 by Dr   Chase Caller for refractory cough since around 2015    11/16/2017 1st  office visit/ Shakeel Disney   Chief Complaint  Patient presents with  . Pulmonary Consult    Second opinion on cough per Dr Chase Caller. She states she has been coughing for the past 2-3 years. She states she occ produces some clear sputum. She has noticed that the cough can be triggered by talking. It will occ wake her up in the night.   has been eval by S wrigth  Breathing is fine as long as not coughing   [name] Reflux v Neurogenic Cough Differentiator Reflux Comments  Do you awaken from a sound sleep coughing violently?                            With trouble breathing? 2 x per week no   Do you have choking episodes when you cannot  Get enough air, gasping for air ?              no   Do you usually cough when you lie down into  The bed, or when you just lie down to rest ?                          sometimes   Do you usually cough after meals or eating?         2-3 week   Do you cough when (or after) you bend over?    no   GERD SCORE     [name] Reflux v Neurogenic Cough Differentiator Neurogenic   Do you more-or-less cough all day long? sporadic   Does change of temperature make you cough? Yes hot   Does laughing or chuckling cause you to cough? yes   Do fumes (perfume, automobile fumes, burned  Toast, etc.,) cause you to cough ?      yes   Does speaking, singing, or talking on the phone cause you to cough   ?               Sometimes    Neurogenic/Airway score       NO  Other obvious patterns in  day to day or daytime variability or assoc excess/ purulent sputum or mucus plugs or  hemoptysis or cp or chest tightness, subjective wheeze or overt sinus or hb symptoms. No unusual exposure hx or h/o childhood pna/ asthma or knowledge of premature birth.   Also denies any obvious fluctuation of symptoms with weather or environmental changes or other aggravating or alleviating factors except as outlined above   Current Allergies, Complete Past Medical History, Past Surgical History, Family History, and Social History were reviewed in [facility] record.  ROS  The following are not active complaints unless bolded Hoarseness, sore throat, dysphagia, dental problems, itching, sneezing,  nasal congestion or discharge of excess mucus or purulent secretions, ear ache,   fever, chills, sweats, unintended wt loss or wt gain, classically pleuritic or exertional cp,  orthopnea pnd or leg swelling, presyncope, palpitations,  abdominal pain, anorexia, nausea, vomiting, diarrhea  or change in bowel habits or change in bladder habits, change in stools or change in urine, dysuria, hematuria,  rash, arthralgias, visual complaints, headache, numbness, weakness or ataxia or problems with walking or coordination,  change in mood/affect or memory.        Current Meds  Medication Sig  . acetaminophen (TYLENOL) 500 MG tablet Take 2 tablets (1,000 mg total) by mouth every 8 (eight) hours as needed. DO NOT take more than 6 tablets per day.  Marland Kitchen acyclovir (ZOVIRAX) 400 MG tablet Take 1 tablet (400 mg total) by mouth 3 (three) times daily as needed (flares).  Marland Kitchen albuterol (PROAIR HFA) 108 (90 Base) MCG/ACT inhaler Inhale 2 puffs into the lungs every 4 (four) hours as needed for wheezing or shortness of breath.  Marland Kitchen aspirin 81 MG EC tablet Take 81 mg by mouth daily.    . Blood Glucose Monitoring Suppl (ONETOUCH VERIO FLEX SYSTEM) w/Device KIT 1 each by Does not apply route 3 (three) times daily.  . budesonide-formoterol (SYMBICORT) 80-4.5 MCG/ACT inhaler Inhale 2 puffs into the lungs 2 (two)  times daily.  . calcium carbonate (CALCIUM 600) 600 MG TABS tablet Take 600 mg by mouth.  . Continuous Blood Gluc Receiver (FREESTYLE LIBRE 14 DAY READER) DEVI 1 each by Does not apply route 4 (four) times daily.  . Continuous Blood Gluc Sensor (FREESTYLE LIBRE 14 DAY SENSOR) MISC 1 each by Does not apply route 4 (four) times daily.  . Continuous Glucose Monitor DEVI Use as directed.  Marland Kitchen esomeprazole (NEXIUM) 20 MG capsule Take 20 mg by mouth at bedtime.  . fluticasone (FLONASE) 50 MCG/ACT nasal spray USE 1 SPRAY(S) IN EACH NOSTRIL ONCE DAILY  . gabapentin (NEURONTIN) 300 MG capsule Take 300 mg by mouth 3 (three) times daily.   Marland Kitchen glucose blood (ONETOUCH VERIO) test strip Check blood sugar 3 times a day  . hydrochlorothiazide (HYDRODIURIL) 25 MG tablet Take 1 tablet (25 mg total) by mouth daily.  . Insulin Disposable Pump (V-GO 30) KIT 1 kit by Does not apply route daily. Fill and place daily with insulin, use 4 clicks with meals and 2 clicks with snacks  . Insulin Pen Needle (B-D UF III MINI PEN NEEDLES) 31G X 5 MM MISC Use to inject insulin twice a day  . insulin regular human CONCENTRATED (HUMULIN R) 500 UNIT/ML injection Use to fill Vgo daily.  Use 3-4 clicks with breakfast, 3-4 clicks with lunch, and 4-5 clicks with dinner.  Use 2 clicks with snacks twice daily.  . Insulin Syringe-Needle U-100 (INSULIN SYRINGE 1CC/30GX1/2") 30G X 1/2" 1 ML MISC Use to fill Vgo daily  . irbesartan (AVAPRO) 150 MG tablet Take 1 tablet (150 mg total) by mouth daily.  Marland Kitchen JANUVIA 100 MG tablet Take 100 mg by mouth daily.   . metFORMIN (GLUCOPHAGE) 1000 MG tablet Take 1 tablet (1,000 mg total) by mouth 2 (two) times daily with a meal.  . Multiple Vitamin (MULTIVITAMIN WITH MINERALS) TABS tablet Take 1 tablet by mouth daily.  Marland Kitchen olopatadine (PATANOL) 0.1 % ophthalmic solution   . ONETOUCH DELICA LANCETS FINE MISC Check blood sugar 3 times a day  . oxyCODONE-acetaminophen (PERCOCET/ROXICET) 5-325 MG tablet Take 1 tablet  by mouth every 8 (eight) hours as needed. For back pain. Fill prescription 30 days from last dispense date.  . pantoprazole (PROTONIX) 40 MG tablet TAKE 1 TABLET BY MOUTH TWICE DAILY  . Polyethyl Glycol-Propyl Glycol (SYSTANE) 0.4-0.3 % SOLN Apply  to eye as needed.  . potassium chloride 20 MEQ TBCR Take 20 mEq by mouth daily.  . ranitidine (ZANTAC) 300 MG tablet Take 1 tablet (300 mg total) by mouth at bedtime.  Marland Kitchen VICTOZA 18 MG/3ML SOPN INJECT 0.3 MILLILITER (1.8MG) SUBCUTANEOUSLY TO  SKIN  ONCE  DAILY  .          Review of Systems     Objective:   Physical Exam    Obese somber amb bf nad/ did not cough the entire ov    Wt Readings from Last 3 Encounters:  11/16/17 256 lb (116.1 kg)  11/14/17 260 lb 11.2 oz (118.3 kg)  10/23/17 259 lb (117.5 kg)     Vital signs reviewed - Note on arrival 02 sats  93% on RA     HEENT: nl dentition, turbinates bilaterally, and oropharynx. Nl external ear canals without cough reflex - moderate bilateral non-specific turbinate edema     NECK :  without JVD/Nodes/TM/ nl carotid upstrokes bilaterally   LUNGS: no acc muscle use,  Nl contour chest which is clear to A and P bilaterally with  cough  exp maneuvers assoc with psuedowheeze   CV:  RRR  no s3 or murmur or increase in P2, and no edema   ABD:  Quite obese/ soft and nontender with nl inspiratory excursion in the supine position. No bruits or organomegaly appreciated, bowel sounds nl  MS:  Nl gait/ ext warm without deformities, calf tenderness, cyanosis or clubbing No obvious joint restrictions   SKIN: warm and dry without lesions    NEURO:  alert, approp, nl sensorium with  no motor or cerebellar deficits apparent.        CXR PA and Lateral:   11/16/2017 :    I personally reviewed images and agree with radiology impression as follows:   No active cardiopulmonary disease.   Labs ordered 11/16/2017  Allergy profile : did not go to lab   Assessment:

## 2017-11-16 NOTE — Patient Instructions (Addendum)
Change gabapentin to 300 mg three times daily   For drainage / throat tickle try take CHLORPHENIRAMINE  4 mg - take one every 4 hours as needed - available over the counter- may cause drowsiness so start with just a bedtime dose or two and see how you tolerate it before trying in daytime    Stop  nexium  And continue pantoprazole 40 mg Take 30- 60 min before your first and last meals of the day and Zantac (ranitidine) at bedtime   Change symbicort 80 to where you take it up to 2 puff every 12 hours only if you feel you really need it for breathing or if it helps your cough   GERD (REFLUX)  is an extremely common cause of respiratory symptoms just like yours , many times with no obvious heartburn at all.    It can be treated with medication, but also with lifestyle changes including elevation of the head of your bed (ideally with 6 inch  bed blocks),  Smoking cessation, avoidance of late meals, excessive alcohol, and avoid fatty foods, chocolate, peppermint, colas, red wine, and acidic juices such as orange juice.  NO MINT OR MENTHOL PRODUCTS SO NO COUGH DROPS   USE SUGARLESS CANDY INSTEAD (Jolley ranchers or Stover's or Life Savers) or even ice chips will also do - the key is to swallow to prevent all throat clearing. NO OIL BASED VITAMINS - use powdered substitutes.   Please remember to go to the lab and x-ray department downstairs in the basement  for your tests - we will call you with the results when they are available.     Please schedule a follow up office visit in 6 weeks, call sooner if needed with all medications /inhalers/ solutions in hand so we can verify exactly what you are taking. This includes all medications from all doctors and over the [place] separate them into two bags:  the ones you take automatically, no matter what, vs the ones you take just when you feel you need them "BAG #2 is UP TO YOU"  - this will really help [place] help you take your medications more  effectively.  - add: needs to go to lab to complete the w/u

## 2017-11-17 NOTE — Progress Notes (Signed)
Internal Medicine Clinic Attending  Case discussed with Dr. Rathoreat the time of the visit. We reviewed the resident's history and exam and pertinent patient test results. I agree with the assessment, diagnosis, and plan of care documented in the resident's note.  

## 2017-11-17 NOTE — Progress Notes (Signed)
Spoke with pt and notified of results per Dr. Wert. Pt verbalized understanding and denied any questions. 

## 2017-11-20 ENCOUNTER — Telehealth: Payer: Self-pay | Admitting: Internal Medicine

## 2017-11-20 DIAGNOSIS — K219 Gastro-esophageal reflux disease without esophagitis: Secondary | ICD-10-CM

## 2017-11-21 ENCOUNTER — Encounter: Payer: Self-pay | Admitting: Internal Medicine

## 2017-11-21 NOTE — Assessment & Plan Note (Addendum)
CT sinus 02/23/17 neg for sinus dz - Allergy profile 11/16/2017 >  Eos 0. /  IgE  Ordered but not done  - FENO 11/16/2017  =  12 - Spirometry 11/16/2017  FEV1 1.33 (62%)  Ratio 69 p am symb 80 x 2  - 11/16/2017 trial of gabapentin 300 tid / max gerd rx/ 1st gen H1 blockers per guidelines     Of the three most common causes of  Sub-acute / recurrent or chronic cough, only one (GERD)  can actually contribute to/ trigger  the other two (asthma and post nasal drip syndrome)  and perpetuate the cylce of cough.  While not intuitively obvious, many patients with chronic low grade reflux do not cough until there is a primary insult that disturbs the protective epithelial barrier and exposes sensitive nerve endings.   This is typically viral but can due to PNDS and  either may apply here.     The point is that once this occurs, it is difficult to eliminate the cycle  using anything but a maximally effective acid suppression regimen at least in the short run, accompanied by an appropriate diet to address non acid GERD and control / eliminate the cough itself completely with the use of gabapentin titrated up to a max of 300 mg tid along with elimination of all pnds with 1st gen H1 blockers per guidelines    The pt should then return here with all meds in hand using a trust but verify approach to confirm accurate Medication  Reconciliation The principal here is that until we are certain that the  patients are doing what we've asked, it makes no sense to ask them to do more.      I had an extended discussion with the patient reviewing all relevant studies completed to date and  lasting 25 minutes of a 40  minute transition of care office visit with pt new to me     re  severe non-specific but potentially very serious refractory respiratory symptoms of uncertain and potentially multiple  etiologies.   Each maintenance medication was reviewed in detail including most importantly the difference between maintenance and  prns and under what circumstances the prns are to be triggered using an action plan format that is not reflected in the computer generated alphabetically organized AVS.    Please see AVS for specific instructions unique to this office visit that I personally wrote and verbalized to the the pt in detail and then reviewed with pt  by my nurse highlighting any changes in therapy/plan of care  recommended at today's visit.

## 2017-11-23 ENCOUNTER — Other Ambulatory Visit: Payer: Self-pay | Admitting: Internal Medicine

## 2017-11-23 NOTE — Telephone Encounter (Signed)
PT NEEDS REFILL ON OXYCODONE 5-325, [facility] [place]

## 2017-11-23 NOTE — Telephone Encounter (Signed)
Called pharmacy, medication has been ready for pick up for several days, text message was sent to pt for pick up, called pt, got vmail, lm for call to pharm

## 2017-11-24 NOTE — Telephone Encounter (Signed)
Confirmed with pharmacist that they have this Rx, however, it is on hold 2/2 insurance. Patient picked up 90 day supply in April 2019 so it is too soon for insurance to cover. Patient made aware. Hubbard Hartshorn, RN, BSN

## 2017-11-24 NOTE — Telephone Encounter (Signed)
pantoprazole (PROTONIX) 40 MG tablet, is not at the pharmacy. Please call pt back.

## 2017-11-28 DIAGNOSIS — M5441 Lumbago with sciatica, right side: Secondary | ICD-10-CM | POA: Diagnosis not present

## 2017-11-28 DIAGNOSIS — I10 Essential (primary) hypertension: Secondary | ICD-10-CM | POA: Diagnosis not present

## 2017-11-28 DIAGNOSIS — Z6841 Body Mass Index (BMI) 40.0 and over, adult: Secondary | ICD-10-CM | POA: Diagnosis not present

## 2017-12-05 DIAGNOSIS — M5126 Other intervertebral disc displacement, lumbar region: Secondary | ICD-10-CM | POA: Diagnosis not present

## 2017-12-05 DIAGNOSIS — M5441 Lumbago with sciatica, right side: Secondary | ICD-10-CM | POA: Diagnosis not present

## 2017-12-05 DIAGNOSIS — M48061 Spinal stenosis, lumbar region without neurogenic claudication: Secondary | ICD-10-CM | POA: Diagnosis not present

## 2017-12-06 ENCOUNTER — Encounter: Payer: Self-pay | Admitting: *Deleted

## 2017-12-28 ENCOUNTER — Other Ambulatory Visit: Payer: Self-pay | Admitting: Internal Medicine

## 2017-12-29 ENCOUNTER — Encounter: Payer: Self-pay | Admitting: Internal Medicine

## 2017-12-29 ENCOUNTER — Ambulatory Visit: Payer: PPO | Admitting: Internal Medicine

## 2017-12-29 VITALS — BP 142/88 | HR 90 | Ht 64.0 in | Wt 250.0 lb

## 2017-12-29 DIAGNOSIS — J449 Chronic obstructive pulmonary disease, unspecified: Secondary | ICD-10-CM

## 2017-12-29 DIAGNOSIS — R05 Cough: Secondary | ICD-10-CM

## 2017-12-29 DIAGNOSIS — R053 Chronic cough: Secondary | ICD-10-CM

## 2017-12-29 MED ORDER — GABAPENTIN 300 MG PO CAPS: 300.0000 mg | ORAL_CAPSULE | Freq: Three times a day (TID) | ORAL | 2 refills | Status: DC

## 2017-12-29 NOTE — Progress Notes (Signed)
Subjective:     Patient ID: [name], female   DOB: [birth year],  MRN: [id number]    Brief patient profile:  29 yobf quit smoking 07/1998 says born with asthma remembers freq er trips and allergy eval by Dr Bernita Buffy on shots with reduction in need for "the pump" for breathing / some better after quit smoking then  referred to pulmonary clinic 11/16/2017 by Dr   Chase Caller for refractory cough since around 2015   History of Present Illness  11/16/2017 1st  office visit/ [name]   Chief Complaint  Patient presents with  . Pulmonary Consult    Second opinion on cough per Dr Chase Caller. She states she has been coughing for the past 2-3 years. She states she occ produces some clear sputum. She has noticed that the cough can be triggered by talking. It will occ wake her up in the night.   has been eval by S wright  Breathing is fine as long as not coughing   [name] Reflux v Neurogenic Cough Differentiator Reflux Comments  Do you awaken from a sound sleep coughing violently?                            With trouble breathing? 2 x per week no   Do you have choking episodes when you cannot  Get enough air, gasping for air ?              no   Do you usually cough when you lie down into  The bed, or when you just lie down to rest ?                          sometimes   Do you usually cough after meals or eating?         2-3 week   Do you cough when (or after) you bend over?    no   GERD SCORE     [name] Reflux v Neurogenic Cough Differentiator Neurogenic   Do you more-or-less cough all day long? sporadic   Does change of temperature make you cough? Yes hot   Does laughing or chuckling cause you to cough? yes   Do fumes (perfume, automobile fumes, burned  Toast, etc.,) cause you to cough ?      yes   Does speaking, singing, or talking on the phone cause you to cough   ?               Sometimes    Neurogenic/Airway score      rec Change gabapentin to 300 mg three times daily  For drainage /  throat tickle try take CHLORPHENIRAMINE  4 mg - take one every 4 hours as needed - available over the counter- may cause drowsiness so start with just a bedtime dose or two and see how you tolerate it before trying in daytime   Stop  nexium  And continue pantoprazole 40 mg Take 30- 60 min before your first and last meals of the day and Zantac (ranitidine) at bedtime  Change symbicort 80 to where you take it up to 2 puff every 12 hours only if you feel you really need it for breathing or if it helps your cough GERD diet  Please schedule a follow up office visit in 6 weeks, call sooner if needed with all medications /inhalers/ solutions in hand so we can verify exactly what  you are taking. This includes all medications from all doctors and over the [place] separate them into two bags:  the ones you take automatically, no matter what, vs the ones you take just when you feel you need them "BAG #2 is UP TO YOU"  - this will really help [place] help you take your medications more effectively.      12/29/2017  f/u ov/[name] re: cough since 2015 / brought meds but totally confused concept of maint vs prns and did not restart gabapentin / no worse off symbicort  Chief Complaint  Patient presents with  . Follow-up    Cough had improved some and then worsened again 2 wks ago- occ prod with clear sputum. She is using her albuterol inhaler 1 x per wk on average.   cough never completely resolved / still using lots of  mints Dyspnea:  Only when coughing or steps = MMRC1 = can walk nl pace, flat grade, can't hurry or go uphills or steps s sob    Cough is dry day > noct esp with use of voice/ laughter   Has not needed saba much at all since stopped symbicort    No obvious day to day or daytime variability or assoc excess/ purulent sputum or mucus plugs or hemoptysis or cp or chest tightness, subjective wheeze or overt sinus or hb symptoms.     Also denies any obvious fluctuation of symptoms with weather or  environmental changes or other aggravating or alleviating factors except as outlined above   No unusual exposure hx or h/o childhood pna or knowledge of premature birth.  Current Allergies, Complete Past Medical History, Past Surgical History, Family History, and Social History were reviewed in [employer] record.  ROS  The following are not active complaints unless bolded Hoarseness, sore throat, dysphagia, dental problems, itching, sneezing,  nasal congestion or discharge of excess mucus or purulent secretions, ear ache,   fever, chills, sweats, unintended wt loss or wt gain, classically pleuritic or exertional cp,  orthopnea pnd or arm/hand swelling  or leg swelling, presyncope, palpitations, abdominal pain, anorexia, nausea, vomiting, diarrhea  or change in bowel habits or change in bladder habits, change in stools or change in urine, dysuria, hematuria,  rash, arthralgias, visual complaints, headache, numbness, weakness or ataxia or problems with walking or coordination,  change in mood or  memory.        Current Meds  Medication Sig  . acyclovir (ZOVIRAX) 400 MG tablet Take 1 tablet (400 mg total) by mouth 3 (three) times daily as needed (flares).  Marland Kitchen albuterol (PROVENTIL HFA;VENTOLIN HFA) 108 (90 Base) MCG/ACT inhaler INHALE 2 PUFFS BY MOUTH EVERY 4 HOURS AS NEEDED FOR WHEEZING OR SHORTNESS OF BREATH  . aspirin 81 MG EC tablet Take 81 mg by mouth daily.    . Blood Glucose Monitoring Suppl (ONETOUCH VERIO FLEX SYSTEM) w/Device KIT 1 each by Does not apply route 3 (three) times daily.  . Canagliflozin-metFORMIN HCl (INVOKAMET) 50-1000 MG TABS Take 50-1,000 mg by mouth 2 (two) times daily.  . Continuous Blood Gluc Receiver (FREESTYLE LIBRE 14 DAY READER) DEVI 1 each by Does not apply route 4 (four) times daily.  . Continuous Blood Gluc Sensor (FREESTYLE LIBRE 14 DAY SENSOR) MISC 1 each by Does not apply route 4 (four) times daily.  . Continuous Glucose Monitor DEVI Use as  directed.  Marland Kitchen glucose blood (ONETOUCH VERIO) test strip Check blood sugar 3 times a day  . hydrochlorothiazide (  HYDRODIURIL) 25 MG tablet Take 1 tablet (25 mg total) by mouth daily.  . Insulin Disposable Pump (V-GO 30) KIT 1 kit by Does not apply route daily. Fill and place daily with insulin, use 4 clicks with meals and 2 clicks with snacks  . Insulin Pen Needle (B-D UF III MINI PEN NEEDLES) 31G X 5 MM MISC Use to inject insulin twice a day  . insulin regular human CONCENTRATED (HUMULIN R) 500 UNIT/ML injection Use to fill Vgo daily.  Use 3-4 clicks with breakfast, 3-4 clicks with lunch, and 4-5 clicks with dinner.  Use 2 clicks with snacks twice daily.  . Insulin Syringe-Needle U-100 (INSULIN SYRINGE 1CC/30GX1/2") 30G X 1/2" 1 ML MISC Use to fill Vgo daily  . irbesartan (AVAPRO) 150 MG tablet Take 1 tablet (150 mg total) by mouth daily.  Marland Kitchen JANUVIA 100 MG tablet Take 100 mg by mouth daily.   . Multiple Vitamin (MULTIVITAMIN WITH MINERALS) TABS tablet Take 1 tablet by mouth daily.  Glory Rosebush DELICA LANCETS FINE MISC Check blood sugar 3 times a day  . pantoprazole (PROTONIX) 40 MG tablet TAKE 1 TABLET BY MOUTH TWICE DAILY  . Polyethyl Glycol-Propyl Glycol (SYSTANE) 0.4-0.3 % SOLN Apply to eye as needed.  . ranitidine (ZANTAC) 300 MG tablet Take 1 tablet (300 mg total) by mouth at bedtime.  . rosuvastatin (CRESTOR) 20 MG tablet Take 1 tablet (20 mg total) by mouth at bedtime.  Marland Kitchen VICTOZA 18 MG/3ML SOPN INJECT 0.3 MILLILITER (1.8MG) SUBCUTANEOUSLY TO  SKIN  ONCE  DAILY           .      Objective:   Physical Exam    Obese bf / did not cough entire ov    12/29/2017      250   11/16/17 256 lb (116.1 kg)  11/14/17 260 lb 11.2 oz (118.3 kg)  10/23/17 259 lb (117.5 kg)     Vital signs reviewed - Note on arrival 02 sats  98% on RA     HEENT: nl dentition, and oropharynx. Nl external ear canals without cough reflex- moderate bilateral non-specific turbinate edema     NECK :  without  JVD/Nodes/TM/ nl carotid upstrokes bilaterally   LUNGS: no acc muscle use,  Nl contour chest which is clear to A and P bilaterally without cough on insp or exp maneuvers   CV:  RRR  no s3 or murmur or increase in P2, and no edema   ABD:  Obese/ soft and nontender with nl inspiratory excursion in the supine position. No bruits or organomegaly appreciated, bowel sounds nl  MS:  Nl gait/ ext warm without deformities, calf tenderness, cyanosis or clubbing No obvious joint restrictions   SKIN: warm and dry without lesions    NEURO:  alert, approp, nl sensorium with  no motor or cerebellar deficits apparent.       CXR PA and Lateral:   11/16/2017 :    I personally reviewed images and agree with radiology impression as follows:   No active cardiopulmonary disease.      Assessment:

## 2017-12-29 NOTE — Patient Instructions (Addendum)
Start  gabapentin to 300 mg three times daily   For drainage / throat tickle continue CHLORPHENIRAMINE  4 mg - take one every 4 hours as needed - available over the counter- may cause drowsiness so   Continue bedtime dose  X  two  Then take it every 4 hours during the day just  as needed for throat tickle   Stop  nexium  And continue pantoprazole 40 mg Take 30- 60 min before your first and last meals of the day and Zantac (ranitidine) at bedtime     GERD (REFLUX)  is an extremely common cause of respiratory symptoms just like yours , many times with no obvious heartburn at all.    It can be treated with medication, but also with lifestyle changes including elevation of the head of your bed (ideally with 6 inch  bed blocks),  Smoking cessation, avoidance of late meals, excessive alcohol, and avoid fatty foods, chocolate, peppermint, colas, red wine, and acidic juices such as orange juice.  NO MINT OR MENTHOL PRODUCTS SO NO COUGH DROPS   USE SUGARLESS CANDY INSTEAD (Jolley ranchers or Stover's or Life Savers) or even ice chips will also do - the key is to swallow to prevent all throat clearing. NO OIL BASED VITAMINS - use powdered substitutes.   See [name] NP in 4 weeks with all your medications, even over the counter meds, separated in two separate bags, the ones you take no matter what vs the ones you stop once you feel better and take only as needed when you feel you need them.   [name]  will generate for you a new user friendly medication calendar that will put [place] all on the same page re: your medication use.     Without this process, it simply isn't possible to assure that we are providing  your outpatient care  with  the attention to detail we feel you deserve.   If we cannot assure that you're getting that kind of care,  then we cannot manage your problem effectively from this clinic.  Once you have seen [name] and we are sure that we're all on the same page with your medication use she will  arrange follow up with me.  Add : needs to get labs done at next ov

## 2017-12-31 ENCOUNTER — Encounter: Payer: Self-pay | Admitting: Internal Medicine

## 2017-12-31 NOTE — Assessment & Plan Note (Signed)
Quit smoking 07/1998 PFTs  May 24, 2013  that showed moderate airflow obstruction with FEV1 is 1.35 L/59%. Ratio 62. No sign change BD  - Spirometry 11/16/2017  FEV1 1.33 (62%)  Ratio 69 p am symbicort  - 11/16/2017  After extensive coaching inhaler device  effectiveness =    90%   No worse p stopped symbicort (instrutions were to use it prn, not stop it) ok to continue to leave off for now and focus on upper airway issues as no worse sob or cough off it.

## 2017-12-31 NOTE — Assessment & Plan Note (Signed)
CT sinus 02/23/17 neg for sinus dz - Allergy profile 11/16/2017 >  Eos 0. /  IgE  Ordered but not done  - FENO 11/16/2017  =  12 - Spirometry 11/16/2017  FEV1 1.33 (62%)  Ratio 69 p am symb 80 x 2 > changed to prn 11/16/17  - 11/16/2017 trial of gabapentin 300 tid / max gerd rx/ 1st gen H1 blockers per guidelines   > did not follow instructions > repeat same rx 12/29/2017    Lack of cough resolution on a verified empirical regimen (which we so far have failed to accomplish) could mean an alternative diagnosis, persistence of the disease state (eg sinusitis or bronchiectasis- both ruled out here) , or inadequacy of currently available therapy (eg no medical rx available for non-acid gerd - which she is potentially fueling with inapprop diet though is beginning to lose wt)  Discussed The standardized cough guidelines published in Chest by Lissa Morales in 2006 are still the best available and consist of a multiple step process (up to 12!) , not a single office visit,  and are intended  to address this problem logically,  with an alogrithm dependent on response to empiric treatment at  each progressive step  to determine a specific diagnosis with  minimal addtional testing needed. Therefore if adherence is an issue or can't be accurately verified,  it's very unlikely the standard evaluation and treatment will be successful here.    Furthermore, response to therapy (other than acute cough suppression, which should only be used short term with avoidance of narcotic containing cough syrups if possible), can be a gradual process for which the patient is not likely to  perceive immediate benefit.  Unlike going to an eye doctor where the best perscription is almost always the first one and is immediately effective, this is almost never the case in the management of chronic cough syndromes. Therefore the patient needs to commit up front to consistently adhere to recommendations  for up to 6 weeks of therapy directed at the  likely underlying problem(s) before the response can be reasonably evaluated.    Needs to return for med rec before next step:   To keep things simple, I have asked the patient to first separate medicines that are perceived as maintenance, that is to be taken daily "no matter what", from those medicines that are taken on only on an as-needed basis and I have given the patient examples of both, and then return to see our NP to generate a  detailed  medication calendar which should be followed until the next physician sees the patient and updates it.      I had an extended discussion with the patient reviewing all relevant studies completed to date and  lasting 15 to 20 minutes of a 25 minute visit    Each maintenance medication was reviewed in detail including most importantly the difference between maintenance and prns and under what circumstances the prns are to be triggered using an action plan format that is not reflected in the computer generated alphabetically organized AVS.    Please see AVS for specific instructions unique to this visit that I personally wrote and verbalized to the the pt in detail and then reviewed with pt  by my nurse highlighting any  changes in therapy recommended at today's visit to their plan of care.

## 2017-12-31 NOTE — Assessment & Plan Note (Signed)
Body mass index is 42.91 kg/m.  -  trending down, encouraged Lab Results  Component Value Date   TSH 1.36 07/27/2016     Contributing to gerd risk/ doe/reviewed the need and the process to achieve and maintain neg calorie balance > defer f/u primary care including intermittently monitoring thyroid status

## 2018-01-04 ENCOUNTER — Other Ambulatory Visit: Payer: Self-pay | Admitting: *Deleted

## 2018-01-04 MED ORDER — IRBESARTAN 150 MG PO TABS: 150.0000 mg | ORAL_TABLET | Freq: Every day | ORAL | 1 refills | Status: DC

## 2018-01-04 NOTE — Telephone Encounter (Signed)
Pt is requesting 90 days supply. Thanks

## 2018-01-08 DIAGNOSIS — M25551 Pain in right hip: Secondary | ICD-10-CM | POA: Diagnosis not present

## 2018-01-08 DIAGNOSIS — M5441 Lumbago with sciatica, right side: Secondary | ICD-10-CM | POA: Diagnosis not present

## 2018-01-08 DIAGNOSIS — M25552 Pain in left hip: Secondary | ICD-10-CM | POA: Diagnosis not present

## 2018-01-08 DIAGNOSIS — I10 Essential (primary) hypertension: Secondary | ICD-10-CM | POA: Diagnosis not present

## 2018-01-13 ENCOUNTER — Other Ambulatory Visit: Payer: Self-pay | Admitting: Internal Medicine

## 2018-01-13 DIAGNOSIS — K219 Gastro-esophageal reflux disease without esophagitis: Secondary | ICD-10-CM

## 2018-01-19 ENCOUNTER — Telehealth: Payer: Self-pay | Admitting: Internal Medicine

## 2018-01-23 ENCOUNTER — Encounter: Payer: Self-pay | Admitting: Adult Health

## 2018-01-23 ENCOUNTER — Ambulatory Visit (INDEPENDENT_AMBULATORY_CARE_PROVIDER_SITE_OTHER): Payer: PPO | Admitting: Adult Health

## 2018-01-23 DIAGNOSIS — R05 Cough: Secondary | ICD-10-CM

## 2018-01-23 DIAGNOSIS — J449 Chronic obstructive pulmonary disease, unspecified: Secondary | ICD-10-CM

## 2018-01-23 DIAGNOSIS — R053 Chronic cough: Secondary | ICD-10-CM

## 2018-01-23 NOTE — Assessment & Plan Note (Signed)
Appears stable , no flare with Symbicort changed to As needed  For flares   Plan  Patient Instructions  Follow med calendar closely and bring to each visit.  Continue on current regimen Follow up with Dr. Melvyn Novas in 2 months and as needed

## 2018-01-23 NOTE — Patient Instructions (Signed)
Follow med calendar closely and bring to each visit.  Continue on current regimen Follow up with Dr. Melvyn Novas in 2 months and as needed

## 2018-01-23 NOTE — Progress Notes (Signed)
_0  ID: [name], female    DOB: [birth year], 57 y.o.   MRN: [id number]  Chief Complaint  Patient presents with  . Follow-up    Referring provider: No ref. provider found  HPI: 57 year old female former smoker seen for pulmonary consult 2014 for chronic cough  Past medical history significant for diabetes  TEST /Events  PFTs  May 24, 2013  that showed moderate airflow obstruction with FEV1 is 1.35 L/59%. Ratio 62. No sign change BD  - Spirometry 11/16/2017  FEV1 1.33 (62%)  Ratio 69 p am symbicort   July 2008 had endoscopy which showed mild gastritis. She is on proton pump inhibitor Mild esophageal dysmotility. Otherwise negative esophagram.  05/2013 CT sinus showed no acute sinus disease 01/2018 PFT today shows similar moderate airflow obstruction , FEV1 62%,ratio 65 FVC 75% , no sign BD , DLCO 75% -RAST 08/2017 >cockroach/ragweed  , IgE 24   01/23/2018 Follow up : Chronic cough /Asthma/COPD /med review Patient presents for a 39-monthfollow-up.  Patient has a history of chronic cough.  She had an initial pulmonary consult 2014.  Pulmonary function test showed moderate airflow obstruction.  She has been treated with gabapentin for cyclical cough.  Previous CT sinus was clear.  High-resolution CT chest showed no evidence of interstitial lung disease.  Stable lung nodule since 2014 consistent with a benign etiology Last visit patient was recommended to restart gabapentin 300 mg 3 times daily.  She was recommend to use Chlortab's every 4 hours as needed for postnasal drip and throat clearing.  And she was recommend to change Symbicort to as needed.  Instructed to increase Protonix to twice daily. Patient says she does feel better.  Cough has decreased by 50%.  She continues to have some throat clearing but this is also decreased.  She denies any hemoptysis, chest pain, orthopnea or edema.  We reviewed all her medications organize them into a medication count with patient education.   It appears that she has taken her medications correctly   Allergies  Allergen Reactions  . Lisinopril Cough    Immunization History  Administered Date(s) Administered  . Influenza Split 03/11/2011, 03/02/2012  . Influenza Whole 02/12/2010  . Influenza,inj,Quad PF,6+ Mos 03/08/2013, 02/27/2014, 05/19/2015, 04/19/2016, 03/09/2017  . PPD Test 10/04/2010, 07/11/2011, 12/05/2012  . Pneumococcal Polysaccharide-23 01/13/2012, 07/18/2017  . Tdap 11/18/2010    Past Medical History:  Diagnosis Date  . Allergy   . Arthritis    back   . Asthma    AS CHILD  . Chronic back pain   . Chronic leg pain    due to back pain  . [place]    [place]  . [place]    with neuropathy  . GERD (gastroesophageal reflux disease)   . Hyperlipidemia   . Hypertension   . Neuromuscular disorder (HCC)    neuropathy  . RECTAL BLEEDING 12/09/2008   Annotation: s/p EGD 7/08 mild gastritis, s/p colonoscopy 7/08- benign polyp  s/p polypectomy and isolated diverticulum.  Qualifier: Diagnosis of  By: Ditzler RN, Debra    . Sleep apnea    CPAP    YEARS AGO DONE 1/2 YEARS AGO AND WAS TOLD DID NOT HAVE  . Tobacco abuse   . Uterine fibroid    s/p hysterectomy    Tobacco History: Social History   Tobacco Use  Smoking Status Former Smoker  . Packs/day: 0.50  . Years: 20.00  . Pack years: 10.00  . Types: Cigarettes  .  Last attempt to quit: 07/23/1998  . Years since quitting: 19.5  Smokeless Tobacco Never Used   Counseling given: Not Answered   Outpatient Medications Prior to Visit  Medication Sig Dispense Refill  . acyclovir (ZOVIRAX) 400 MG tablet Take 1 tablet (400 mg total) by mouth 3 (three) times daily as needed (flares). 90 tablet 3  . albuterol (PROVENTIL HFA;VENTOLIN HFA) 108 (90 Base) MCG/ACT inhaler INHALE 2 PUFFS BY MOUTH EVERY 4 HOURS AS NEEDED FOR WHEEZING OR SHORTNESS OF BREATH 27 each 2  . aspirin 81 MG EC tablet Take 81 mg by mouth daily.      . Blood Glucose  Monitoring Suppl (ONETOUCH VERIO FLEX SYSTEM) w/Device KIT 1 each by Does not apply route 3 (three) times daily. 1 kit 1  . Canagliflozin-metFORMIN HCl (INVOKAMET) 50-1000 MG TABS Take 50-1,000 mg by mouth 2 (two) times daily. 180 tablet 1  . Continuous Blood Gluc Receiver (FREESTYLE LIBRE 14 DAY READER) DEVI 1 each by Does not apply route 4 (four) times daily. 1 Device 0  . Continuous Blood Gluc Sensor (FREESTYLE LIBRE 14 DAY SENSOR) MISC 1 each by Does not apply route 4 (four) times daily. 2 each 12  . Continuous Glucose Monitor DEVI Use as directed. 1 each 0  . gabapentin (NEURONTIN) 300 MG capsule Take 1 capsule (300 mg total) by mouth 3 (three) times daily. 90 capsule 2  . glucose blood (ONETOUCH VERIO) test strip Check blood sugar 3 times a day 100 each 5  . hydrochlorothiazide (HYDRODIURIL) 25 MG tablet Take 1 tablet (25 mg total) by mouth daily. 90 tablet 3  . Insulin Disposable Pump (V-GO 30) KIT 1 kit by Does not apply route daily. Fill and place daily with insulin, use 4 clicks with meals and 2 clicks with snacks 1 kit 12  . Insulin Pen Needle (B-D UF III MINI PEN NEEDLES) 31G X 5 MM MISC Use to inject insulin twice a day 190 each 5  . insulin regular human CONCENTRATED (HUMULIN R) 500 UNIT/ML injection Use to fill Vgo daily.  Use 3-4 clicks with breakfast, 3-4 clicks with lunch, and 4-5 clicks with dinner.  Use 2 clicks with snacks twice daily. 3 vial 3  . Insulin Syringe-Needle U-100 (INSULIN SYRINGE 1CC/30GX1/2") 30G X 1/2" 1 ML MISC Use to fill Vgo daily 100 each 5  . irbesartan (AVAPRO) 150 MG tablet Take 1 tablet (150 mg total) by mouth daily. 90 tablet 1  . JANUVIA 100 MG tablet Take 100 mg by mouth daily.     . Multiple Vitamin (MULTIVITAMIN WITH MINERALS) TABS tablet Take 1 tablet by mouth daily.    Glory Rosebush DELICA LANCETS FINE MISC Check blood sugar 3 times a day 100 each 12  . pantoprazole (PROTONIX) 40 MG tablet TAKE 1 TABLET BY MOUTH TWICE DAILY 180 tablet 0  . Polyethyl  Glycol-Propyl Glycol (SYSTANE) 0.4-0.3 % SOLN Apply to eye as needed.    . ranitidine (ZANTAC) 300 MG tablet TAKE 1 TABLET BY MOUTH AT BEDTIME 90 tablet 1  . VICTOZA 18 MG/3ML SOPN INJECT 0.3 MILLILITER (1.8MG) SUBCUTANEOUSLY TO  SKIN  ONCE  DAILY 36 pen 0  . oxyCODONE-acetaminophen (PERCOCET/ROXICET) 5-325 MG tablet Take 1 tablet by mouth every 8 (eight) hours as needed. For back pain. Fill prescription 30 days from last dispense date. 50 tablet 0  . rosuvastatin (CRESTOR) 20 MG tablet Take 1 tablet (20 mg total) by mouth at bedtime. 90 tablet 3   No facility-administered medications prior to  visit.      Review of Systems  Constitutional:   No  weight loss, night sweats,  Fevers, chills + fatigue, or  lassitude.  HEENT:   No headaches,  Difficulty swallowing,  Tooth/dental problems, or  Sore throat,                No sneezing, itching, ear ache,  +nasal congestion, post nasal drip,   CV:  No chest pain,  Orthopnea, PND, swelling in lower extremities, anasarca, dizziness, palpitations, syncope.   GI  No heartburn, indigestion, abdominal pain, nausea, vomiting, diarrhea, change in bowel habits, loss of appetite, bloody stools.   Resp: .  No chest wall deformity  Skin: no rash or lesions.  GU: no dysuria, change in color of urine, no urgency or frequency.  No flank pain, no hematuria   MS:  + joint pains    Physical Exam  BP (!) 148/82 (BP Location: Left Arm, Cuff Size: Large)   Pulse 96   Ht _0  (1.626 m)   Wt 252 lb 12.8 oz (114.7 kg)   SpO2 100%   BMI 43.39 kg/m   GEN: A/Ox3; pleasant , NAD,  Obese    HEENT:  [place]/AT,  EACs-clear, TMs-wnl, NOSE-clear, THROAT-clear, no lesions, no postnasal drip or exudate noted.   NECK:  Supple w/ fair ROM; no JVD; normal carotid impulses w/o bruits; no thyromegaly or nodules palpated; no lymphadenopathy.    RESP  Clear  P & A; w/o, wheezes/ rales/ or rhonchi. no accessory muscle use, no dullness to percussion  CARD:  RRR, no m/r/g,  no peripheral edema, pulses intact, no cyanosis or clubbing.  GI:   Soft & nt; nml bowel sounds; no organomegaly or masses detected.   Musco: Warm bil, no deformities or joint swelling noted.   Neuro: alert, no focal deficits noted.    Skin: Warm, no lesions or rashes    Lab Results:    BNP No results found for: BNP  ProBNP No results found for: PROBNP  Imaging: No results found.   Assessment & Plan:   No problem-specific Assessment & Plan notes found for this encounter.     Rexene Edison, NP 01/23/2018

## 2018-01-23 NOTE — Assessment & Plan Note (Signed)
Chronic cough is improving on current regimen with AR/GERD prevention along with Gabapentin   Plan  Patient Instructions  Follow med calendar closely and bring to each visit.  Continue on current regimen Follow up with Dr. Melvyn Novas in 2 months and as needed

## 2018-01-23 NOTE — Progress Notes (Signed)
Chart and office note reviewed in detail  > agree with a/p as outlined    

## 2018-02-05 NOTE — Addendum Note (Signed)
Addended by: Parke Poisson E on: 02/05/2018 03:43 PM   Modules accepted: Orders

## 2018-02-06 ENCOUNTER — Other Ambulatory Visit: Payer: Self-pay

## 2018-02-06 ENCOUNTER — Encounter: Payer: Self-pay | Admitting: Internal Medicine

## 2018-02-06 ENCOUNTER — Encounter: Payer: PPO | Admitting: Internal Medicine

## 2018-02-06 ENCOUNTER — Ambulatory Visit (INDEPENDENT_AMBULATORY_CARE_PROVIDER_SITE_OTHER): Payer: PPO | Admitting: Internal Medicine

## 2018-02-06 VITALS — BP 125/76 | HR 82 | Temp 98.2°F | Ht 64.0 in | Wt 254.2 lb

## 2018-02-06 DIAGNOSIS — I10 Essential (primary) hypertension: Secondary | ICD-10-CM

## 2018-02-06 DIAGNOSIS — G8929 Other chronic pain: Secondary | ICD-10-CM

## 2018-02-06 DIAGNOSIS — Z79899 Other long term (current) drug therapy: Secondary | ICD-10-CM | POA: Diagnosis not present

## 2018-02-06 DIAGNOSIS — E785 Hyperlipidemia, unspecified: Secondary | ICD-10-CM

## 2018-02-06 DIAGNOSIS — M5116 Intervertebral disc disorders with radiculopathy, lumbar region: Secondary | ICD-10-CM | POA: Diagnosis not present

## 2018-02-06 DIAGNOSIS — M545 Low back pain: Secondary | ICD-10-CM | POA: Diagnosis not present

## 2018-02-06 DIAGNOSIS — K219 Gastro-esophageal reflux disease without esophagitis: Secondary | ICD-10-CM

## 2018-02-06 DIAGNOSIS — Z90710 Acquired absence of both cervix and uterus: Secondary | ICD-10-CM | POA: Diagnosis not present

## 2018-02-06 DIAGNOSIS — Z23 Encounter for immunization: Secondary | ICD-10-CM

## 2018-02-06 DIAGNOSIS — N898 Other specified noninflammatory disorders of vagina: Secondary | ICD-10-CM | POA: Insufficient documentation

## 2018-02-06 DIAGNOSIS — E1140 Type 2 diabetes mellitus with diabetic neuropathy, unspecified: Secondary | ICD-10-CM

## 2018-02-06 DIAGNOSIS — J449 Chronic obstructive pulmonary disease, unspecified: Secondary | ICD-10-CM

## 2018-02-06 DIAGNOSIS — Z794 Long term (current) use of insulin: Secondary | ICD-10-CM | POA: Diagnosis not present

## 2018-02-06 DIAGNOSIS — Z79891 Long term (current) use of opiate analgesic: Secondary | ICD-10-CM

## 2018-02-06 LAB — POCT GLYCOSYLATED HEMOGLOBIN (HGB A1C): HEMOGLOBIN A1C: 8.5 % — AB (ref 4.0–5.6)

## 2018-02-06 LAB — GLUCOSE, CAPILLARY: Glucose-Capillary: 135 mg/dL — ABNORMAL HIGH (ref 70–99)

## 2018-02-06 MED ORDER — ESTROGENS, CONJUGATED 0.625 MG/GM VA CREA: 1.0000 | TOPICAL_CREAM | Freq: Every day | VAGINAL | 3 refills | Status: DC

## 2018-02-06 MED ORDER — OXYCODONE-ACETAMINOPHEN 5-325 MG PO TABS: 1.0000 | ORAL_TABLET | Freq: Three times a day (TID) | ORAL | 0 refills | Status: DC | PRN

## 2018-02-06 NOTE — Assessment & Plan Note (Addendum)
HPI: [name] continues to have stable lumbar back pain that radiates into bilateral legs, but is worse on the right. She has been on chronic Percocet, but hasn't used any opiates in 6 weeks because she ran out of her prescription at the end of June (last filled at the beginning of June). She didn't request a refill because she was waiting for this appointment, which ended up being scheduled for later than she anticipated. She reports that she doesn't like to use tylenol or NSAIDs because they hurt her stomach and don't help the pain. She reports that since her last visit she saw Dr. Arneta Cliche at [facility] neurosurgery in June and July. An MRI with this practice showed a herniated disc at the site of her previous surgery. Dr. Arneta Cliche recommended surgical intervention, but the patient is wary of surgery and would like a second opinion.   Assessment: Patient has been on chronic opioid therapy with no red flag behaviors. Renewed pain contract today and will refill Percocet. Patient would benefit from a neurosurgical second opinion given her hesitance to have surgery.  Plan 1. Referral placed to neurosurgery 2. 50 tablets Percocet filled

## 2018-02-06 NOTE — Assessment & Plan Note (Signed)
A1c is 8.5 today, stable from 8.4 three months ago. Current regimen includes Vgo 30 with D-826 insulin (3 clicks 3 times a day with meals and 2 clicks once or twice a day with snacks), Invokamet, Januvia, and Victoza. She reports 3 episodes of hypoglycemia (lowest 69) over the past 3 months with associated shakiness. These episodes have occurred when she has taken her insulin and then forgotten to eat or when she accidentally did an extra click of the Vgo. She reports that she has not been exercising much recently because her work has been more demanding and because her back pain has been so severe. She reports that she is trying to change her diet to include more low-carb vegetables and fruits instead of chips.   Assessment: A1c remains high. Based on the patient's CGM readings, her morning glucose widely varies from 107 to 233, and therefore she is not a good candidate for basal insulin at this time. Based on her widely variable sugars throughout the day, she would benefit most from better titrating her insulin to her carb intake at each meal. Recommended that she meet with Butch Penny for diabetes education, but she wants to try to improve her diet and exercise regimen on her own first. Discussed low carb dietary options and fitness goals today.  Plan 1. Continue with current regimen as detailed above. 2. Recheck A1c in 3 months

## 2018-02-06 NOTE — Progress Notes (Signed)
   CC: DM and HTN f/u  HPI:   Ms.Soliana Boule is a 57 y.o. female with a medical history of HTN, COPD, DMII, HLD, GERD, and chronic back pain who presents for follow up of her DM and HTN. Back pain and vaginal irritation were also discussed today. Please see problem based charting for the status of the patient's current and chronic medical conditions.   Past Medical History:  Diagnosis Date  . Allergy   . Arthritis    back   . Asthma    AS CHILD  . Chronic back pain   . Chronic leg pain    due to back pain  . Cocaine abuse ([place])    in remission  . Diabetes ([place])    with neuropathy  . GERD (gastroesophageal reflux disease)   . Hyperlipidemia   . Hypertension   . Neuromuscular disorder (HCC)    neuropathy  . RECTAL BLEEDING 12/09/2008   Annotation: s/p EGD 7/08 mild gastritis, s/p colonoscopy 7/08- benign polyp  s/p polypectomy and isolated diverticulum.  Qualifier: Diagnosis of  By: Ditzler RN, Debra    . Sleep apnea    CPAP    YEARS AGO DONE 1/2 YEARS AGO AND WAS TOLD DID NOT HAVE  . Tobacco abuse   . Uterine fibroid    s/p hysterectomy    Review of Systems:   Pertinent positives mentioned in HPI. Remainder of all ROS negative.  Physical Exam: Vitals:   02/06/18 1500 02/06/18 1646  BP: 133/69 125/76  Pulse: 94 82  Temp: 98.2 F (36.8 C)   TempSrc: Oral   SpO2: 97%   Weight: 254 lb 3.2 oz (115.3 kg)   Height: 5\' 4"  (1.626 m)    Physical Exam  Constitutional: Well-developed, well-nourished, and in no distress.  Eyes: Pupils are equal, round, and reactive to light. EOM are normal.  Cardiovascular: Normal rate and regular rhythm. No murmurs, rubs, or gallops. Pulmonary/Chest: Effort normal. Clear to auscultation bilaterally. No wheezes, rales, or rhonchi. Abdominal: Bowel sounds present. Soft, non-distended, non-tender. Back: No CVA tenderness. Ext: No lower extremity edema. Skin: Warm and dry. No rashes or wounds.   Assessment & Plan:   See Encounters Tab for  problem based charting.  Patient seen with Dr. Lynnae January

## 2018-02-06 NOTE — Assessment & Plan Note (Signed)
HPI: Ms. Burlison reports a 3 week history of itching and irritation during urination. She denies burning sensation with urination, hematuria, malodorous urine, vaginal discharge, or vaginal bleeding. She reports increased urinary frequency with normal urinary output with each void. She denies fevers, chills, or CVA tenderness. She is sexually active with only her husband and they are in a monogamous relationship. She tried Monistat 3-day which provided relief for a short time before her symptoms recurred. She reports that her vaginal area feels dry. She is s/p hysterectomy.   Assessment: DDx includes UTI, vaginal infection, and atrophic vaginitis. UA performed today - positive for glucose, but negative for leukocyte esterase and nitrites. Patient has no vaginal discharge or vaginal malodor and is monogamous with her husband of many years, so vaginal infection is less likely. Given description of "dry" tissue and the patient's age, atrophic vaginitis is most likely.   Plan 1. Start premarin estrogen cream. Apply intravaginally daily for 2 weeks and then subsequently apply two times per week.

## 2018-02-06 NOTE — Patient Instructions (Addendum)
It was a pleasure meeting you today, [name]!  1. For your blood pressure - Your blood pressure was 125/76. No changes today. Continue your current medications.  2. For your diabetes - Your A1c continue to be elevated. Today we talked about making diet changes to decrease your carb intake. We also talked about controlling your pain better so that you can exercise more. We will reassess your A1c in 3 months. Whenever you are ready, you can meet with Butch Penny, our diabetes educator, again.   3. For your vaginal irritation - Start using premarin estrogen cream. Use this intravaginally once a day for 14 days. Then switch to using it two times per week.   4. For your back pain - We filled out a pain contract today. I have written a prescription for 50 tablets of Percocet. You can take this every 8 hours as needed for severe pain.  - Please follow up with your neurosurgery second opinion.   Please follow up with [place] in 3 months for diabetes follow up.

## 2018-02-06 NOTE — Assessment & Plan Note (Signed)
BP initially 133/69. Recheck was 125/76. Well-controlled on current regimen.  Plan 1. Continue hctz 25 mg and potassium supplementation 2. Continue Irbesartan 150 mg

## 2018-02-09 NOTE — Progress Notes (Signed)
Internal Medicine Clinic Attending  I saw and evaluated the patient.  I personally confirmed the key portions of the history and exam documented by Dr. Dorrell and I reviewed pertinent patient test results.  The assessment, diagnosis, and plan were formulated together and I agree with the documentation in the resident's note. 

## 2018-02-23 ENCOUNTER — Telehealth: Payer: Self-pay | Admitting: Internal Medicine

## 2018-02-23 NOTE — Telephone Encounter (Signed)
Pt requesting medication for a yeast infection.  Patient states she was seen 2 weeks ago and is still itching.  Pt would like a call back.

## 2018-03-05 ENCOUNTER — Ambulatory Visit (INDEPENDENT_AMBULATORY_CARE_PROVIDER_SITE_OTHER): Payer: PPO | Admitting: Internal Medicine

## 2018-03-05 ENCOUNTER — Other Ambulatory Visit: Payer: Self-pay

## 2018-03-05 VITALS — BP 145/74 | HR 91 | Temp 97.8°F | Ht 64.0 in | Wt 251.4 lb

## 2018-03-05 DIAGNOSIS — I10 Essential (primary) hypertension: Secondary | ICD-10-CM | POA: Diagnosis not present

## 2018-03-05 DIAGNOSIS — E119 Type 2 diabetes mellitus without complications: Secondary | ICD-10-CM

## 2018-03-05 DIAGNOSIS — B3731 Acute candidiasis of vulva and vagina: Secondary | ICD-10-CM

## 2018-03-05 DIAGNOSIS — G8929 Other chronic pain: Secondary | ICD-10-CM | POA: Diagnosis not present

## 2018-03-05 DIAGNOSIS — B373 Candidiasis of vulva and vagina: Secondary | ICD-10-CM | POA: Diagnosis not present

## 2018-03-05 MED ORDER — FLUCONAZOLE 150 MG PO TABS: ORAL_TABLET | ORAL | 0 refills | Status: DC

## 2018-03-05 NOTE — Progress Notes (Signed)
   CC: vaginal itching  HPI:  [name] is a 57 y.o. with a PMH of T2DM, HTN, chronic pain presenting to clinic for evaluation of vaginal itching.  Patient reports ~6wks of vaginal itching, genital discomfort and white discharge. She states that when symptoms first began she tried OTC Monistat which did help her symptoms thought they were not completely resolved. She has also tried vagisil wipes which do help with some of the discomfort. At last visit she due to symptoms she was describing the thought was that she was experiencing vaginal dryness and a trial of premerin creme was given. She states that she used it for a week straight and had worsening of symptoms.   She denies dysuria, hematuria, nausea, vomiting, abd pain, flank pain, fevers, chills.   Please see problem based Assessment and Plan for status of patients chronic conditions.  Past Medical History:  Diagnosis Date  . Allergy   . Arthritis    back   . Asthma    AS CHILD  . Chronic back pain   . Chronic leg pain    due to back pain  . Cocaine abuse ([place])    in remission  . Diabetes ([place])    with neuropathy  . GERD (gastroesophageal reflux disease)   . Hyperlipidemia   . Hypertension   . Neuromuscular disorder (HCC)    neuropathy  . RECTAL BLEEDING 12/09/2008   Annotation: s/p EGD 7/08 mild gastritis, s/p colonoscopy 7/08- benign polyp  s/p polypectomy and isolated diverticulum.  Qualifier: Diagnosis of  By: Ditzler RN, Debra    . Sleep apnea    CPAP    YEARS AGO DONE 1/2 YEARS AGO AND WAS TOLD DID NOT HAVE  . Tobacco abuse   . Uterine fibroid    s/p hysterectomy    Review of Systems:   Per HPI  Physical Exam:  Vitals:   03/05/18 1551  BP: (!) 145/74  Pulse: 91  Temp: 97.8 F (36.6 C)  TempSrc: Oral  SpO2: 99%  Weight: 251 lb 6.4 oz (114 kg)  Height: 5\' 4"  (1.626 m)   GENERAL- alert, co-operative, appears as stated age, not in any distress. CARDIAC- RRR, no murmurs, rubs or gallops. RESP-  Moving equal volumes of air, and clear to auscultation bilaterally, no wheezes or crackles. ABDOMEN- Soft, no intertrigo present GU- external genitalia with well-demarcated area or erythema L>R  Assessment & Plan:   See Encounters Tab for problem based charting.   Patient discussed with Dr. Moshe Cipro, MD Internal Medicine PGY-3

## 2018-03-05 NOTE — Assessment & Plan Note (Signed)
Patient on SGLT-2 inhibitor presenting with 6wks of vaginal itching, discharge and discomfort with symptoms and exam consistent with vaginal yeast infection. No sign of cellulitis.  Plan: --diflucan 150mg  once; repeat in 3 days if not resolved --advised to return to clinic if symptoms persist for further work up

## 2018-03-05 NOTE — Patient Instructions (Signed)
Take one tablet of diflucan today. If symptoms are not resolved, take another tablet in 3 days. If symptoms still persisting, please come back to clinic for further evaluation.

## 2018-03-06 NOTE — Progress Notes (Signed)
Internal Medicine Clinic Attending  Case discussed with Dr. Svalina at the time of the visit.  We reviewed the resident's history and exam and pertinent patient test results.  I agree with the assessment, diagnosis, and plan of care documented in the resident's note.  Alexander Raines, M.D., Ph.D.  

## 2018-03-06 NOTE — Telephone Encounter (Signed)
Will close, await pt's callback

## 2018-03-07 DIAGNOSIS — M4316 Spondylolisthesis, lumbar region: Secondary | ICD-10-CM | POA: Diagnosis not present

## 2018-03-07 DIAGNOSIS — M5416 Radiculopathy, lumbar region: Secondary | ICD-10-CM | POA: Diagnosis not present

## 2018-03-07 DIAGNOSIS — M545 Low back pain: Secondary | ICD-10-CM | POA: Diagnosis not present

## 2018-03-07 DIAGNOSIS — M5136 Other intervertebral disc degeneration, lumbar region: Secondary | ICD-10-CM | POA: Diagnosis not present

## 2018-03-26 ENCOUNTER — Ambulatory Visit: Payer: PPO | Admitting: Internal Medicine

## 2018-03-26 ENCOUNTER — Other Ambulatory Visit (INDEPENDENT_AMBULATORY_CARE_PROVIDER_SITE_OTHER): Payer: PPO

## 2018-03-26 ENCOUNTER — Encounter: Payer: Self-pay | Admitting: Internal Medicine

## 2018-03-26 VITALS — BP 124/82 | HR 92 | Ht 62.0 in | Wt 251.0 lb

## 2018-03-26 DIAGNOSIS — R05 Cough: Secondary | ICD-10-CM | POA: Diagnosis not present

## 2018-03-26 DIAGNOSIS — R053 Chronic cough: Secondary | ICD-10-CM

## 2018-03-26 DIAGNOSIS — J449 Chronic obstructive pulmonary disease, unspecified: Secondary | ICD-10-CM

## 2018-03-26 LAB — CBC WITH DIFFERENTIAL/PLATELET
BASOS ABS: 0.1 10*3/uL (ref 0.0–0.1)
BASOS PCT: 0.9 % (ref 0.0–3.0)
EOS ABS: 0.2 10*3/uL (ref 0.0–0.7)
Eosinophils Relative: 3.7 % (ref 0.0–5.0)
HEMATOCRIT: 40.9 % (ref 36.0–46.0)
HEMOGLOBIN: 13.2 g/dL (ref 12.0–15.0)
Lymphocytes Relative: 40.4 % (ref 12.0–46.0)
Lymphs Abs: 2.3 10*3/uL (ref 0.7–4.0)
MCHC: 32.3 g/dL (ref 30.0–36.0)
MCV: 78.3 fl (ref 78.0–100.0)
MONO ABS: 0.4 10*3/uL (ref 0.1–1.0)
Monocytes Relative: 7.5 % (ref 3.0–12.0)
Neutro Abs: 2.7 10*3/uL (ref 1.4–7.7)
Neutrophils Relative %: 47.5 % (ref 43.0–77.0)
Platelets: 248 10*3/uL (ref 150.0–400.0)
RBC: 5.23 Mil/uL — AB (ref 3.87–5.11)
RDW: 16.6 % — AB (ref 11.5–15.5)
WBC: 5.7 10*3/uL (ref 4.0–10.5)

## 2018-03-26 MED ORDER — GABAPENTIN 300 MG PO CAPS: 300.0000 mg | ORAL_CAPSULE | Freq: Four times a day (QID) | ORAL | 2 refills | Status: DC

## 2018-03-26 NOTE — Progress Notes (Signed)
Subjective:     Patient ID: [name], female   DOB: November 23, 1960,  MRN: [id number]    Brief patient profile:  65 yobf quit smoking 07/1998 says born with asthma remembers freq er trips and allergy eval by Dr Bernita Buffy on shots with reduction in need for "the pump" for breathing / some better after quit smoking then  referred to pulmonary clinic 11/16/2017 by Dr   Chase Caller for refractory cough since around 2015   History of Present Illness  11/16/2017 1st  office visit/ [name]   Chief Complaint  Patient presents with  . Pulmonary Consult    Second opinion on cough per Dr Chase Caller. She states she has been coughing for the past 2-3 years. She states she occ produces some clear sputum. She has noticed that the cough can be triggered by talking. It will occ wake her up in the night.   has been eval by S wright  Breathing is fine as long as not coughing   [name] Reflux v Neurogenic Cough Differentiator Reflux Comments  Do you awaken from a sound sleep coughing violently?                            With trouble breathing? 2 x per week no   Do you have choking episodes when you cannot  Get enough air, gasping for air ?              no   Do you usually cough when you lie down into  The bed, or when you just lie down to rest ?                          sometimes   Do you usually cough after meals or eating?         2-3 week   Do you cough when (or after) you bend over?    no   GERD SCORE     [name] Reflux v Neurogenic Cough Differentiator Neurogenic   Do you more-or-less cough all day long? sporadic   Does change of temperature make you cough? Yes hot   Does laughing or chuckling cause you to cough? yes   Do fumes (perfume, automobile fumes, burned  Toast, etc.,) cause you to cough ?      yes   Does speaking, singing, or talking on the phone cause you to cough   ?               Sometimes    Neurogenic/Airway score      rec Change gabapentin to 300 mg three times daily  For drainage /  throat tickle try take CHLORPHENIRAMINE  4 mg - take one every 4 hours as needed - available over the counter- may cause drowsiness so start with just a bedtime dose or two and see how you tolerate it before trying in daytime   Stop  nexium  And continue pantoprazole 40 mg Take 30- 60 min before your first and last meals of the day and Zantac (ranitidine) at bedtime  Change symbicort 80 to where you take it up to 2 puff every 12 hours only if you feel you really need it for breathing or if it helps your cough GERD diet  Please schedule a follow up office visit in 6 weeks, call sooner if needed with all medications /inhalers/ solutions in hand so we can verify exactly what  you are taking. This includes all medications from all doctors and over the [place] separate them into two bags:  the ones you take automatically, no matter what, vs the ones you take just when you feel you need them "BAG #2 is UP TO YOU"  - this will really help [place] help you take your medications more effectively.      12/29/2017  f/u ov/[name] re: cough since 2015 / brought meds but totally confused concept of maint vs prns and did not restart gabapentin / no worse off symbicort  Chief Complaint  Patient presents with  . Follow-up    Cough had improved some and then worsened again 2 wks ago- occ prod with clear sputum. She is using her albuterol inhaler 1 x per wk on average.   cough never completely resolved / still using lots of  mints Dyspnea:  Only when coughing or steps = MMRC1 = can walk nl pace, flat grade, can't hurry or go uphills or steps s sob   Cough is dry day > noct esp with use of voice/ laughter  Has not needed saba much at all since stopped symbicort  rec Start  gabapentin to 300 mg three times daily  For drainage / throat tickle continue CHLORPHENIRAMINE  4 mg - take one every 4 hours as needed - available over the counter- may cause drowsiness so   Continue bedtime dose  X  two  Then take it every 4  hours during the day just  as needed for throat tickle  Stop  nexium  And continue pantoprazole 40 mg Take 30- 60 min before your first and last meals of the day and Zantac (ranitidine) at bedtime  GERD  See [name] NP in 4 weeks with all your medications>  Medication calendar      03/26/2018  f/u ov/[name] re: chewing mint gum/ occ throat clearing / did not bring med calendar  Chief Complaint  Patient presents with  . Follow-up    Cough has resovled. She rarely uses her albuterol inhaler.    Dyspnea:  Steps x one flight and stop at top/ back stops her first  if walking flat surface Cough: not at all at noct/ just using one h1 per day and 2 at hs  Sleeping: fine on cpap / p 2 chlorpheniramine 46m x 2   SABA use:  Very rare inhaler / has symbicort not using   Overt hb  despite ppi bid    No obvious day to day or daytime variability or assoc excess/ purulent sputum or mucus plugs or hemoptysis or cp or chest tightness, subjective wheeze or overt sinus or hb symptoms.   Sleeping as above  without nocturnal  or early am exacerbation  of respiratory  c/o's or need for noct saba. Also denies any obvious fluctuation of symptoms with weather or environmental changes or other aggravating or alleviating factors except as outlined above   No unusual exposure hx or h/o childhood pna/ asthma or knowledge of premature birth.  Current Allergies, Complete Past Medical History, Past Surgical History, Family History, and Social History were reviewed in [employer].  ROS  The following are not active complaints unless bolded Hoarseness, sore throat, dysphagia, dental problems, itching, sneezing,  nasal congestion or discharge of excess mucus or purulent secretions, ear ache,   fever, chills, sweats, unintended wt loss or wt gain, classically pleuritic or exertional cp,  orthopnea pnd or arm/hand swelling  or leg  swelling, presyncope, palpitations, abdominal pain, anorexia,  nausea, vomiting, diarrhea  or change in bowel habits or change in bladder habits, change in stools or change in urine, dysuria, hematuria,  rash, arthralgias, visual complaints, headache, numbness, weakness or ataxia or problems with walking or coordination,  change in mood or  memory.        Current Meds  Medication Sig  . acyclovir (ZOVIRAX) 400 MG tablet Take 1 tablet (400 mg total) by mouth 3 (three) times daily as needed (flares).  Marland Kitchen albuterol (PROVENTIL HFA;VENTOLIN HFA) 108 (90 Base) MCG/ACT inhaler INHALE 2 PUFFS BY MOUTH EVERY 4 HOURS AS NEEDED FOR WHEEZING OR SHORTNESS OF BREATH  . aspirin 81 MG EC tablet Take 81 mg by mouth daily.    . Blood Glucose Monitoring Suppl (ONETOUCH VERIO FLEX SYSTEM) w/Device KIT 1 each by Does not apply route 3 (three) times daily.  . Canagliflozin-metFORMIN HCl (INVOKAMET) 50-1000 MG TABS Take 50-1,000 mg by mouth 2 (two) times daily.  . chlorpheniramine (CHLOR-TRIMETON) 4 MG tablet Take 4 mg by mouth every 4 (four) hours as needed for allergies.  . Continuous Blood Gluc Receiver (FREESTYLE LIBRE 14 DAY READER) DEVI 1 each by Does not apply route 4 (four) times daily.  . Continuous Blood Gluc Sensor (FREESTYLE LIBRE 14 DAY SENSOR) MISC 1 each by Does not apply route 4 (four) times daily.  . Continuous Glucose Monitor DEVI Use as directed.  . gabapentin (NEURONTIN) 300 MG capsule Take 1 capsule (300 mg total) by mouth 4 (four) times daily.  Marland Kitchen glucose blood (ONETOUCH VERIO) test strip Check blood sugar 3 times a day  . hydrochlorothiazide (HYDRODIURIL) 25 MG tablet Take 1 tablet (25 mg total) by mouth daily.  . Insulin Disposable Pump (V-GO 30) KIT 1 kit by Does not apply route daily. Fill and place daily with insulin, use 4 clicks with meals and 2 clicks with snacks  . Insulin Pen Needle (B-D UF III MINI PEN NEEDLES) 31G X 5 MM MISC Use to inject insulin twice a day  . insulin regular human CONCENTRATED (HUMULIN R) 500 UNIT/ML injection Use to fill Vgo  daily.  Use 3-4 clicks with breakfast, 3-4 clicks with lunch, and 4-5 clicks with dinner.  Use 2 clicks with snacks twice daily.  . Insulin Syringe-Needle U-100 (INSULIN SYRINGE 1CC/30GX1/2") 30G X 1/2" 1 ML MISC Use to fill Vgo daily  . irbesartan (AVAPRO) 150 MG tablet Take 1 tablet (150 mg total) by mouth daily.  Marland Kitchen JANUVIA 100 MG tablet Take 100 mg by mouth daily.   . Multiple Vitamin (MULTIVITAMIN WITH MINERALS) TABS tablet Take 1 tablet by mouth daily.  Glory Rosebush DELICA LANCETS FINE MISC Check blood sugar 3 times a day  . pantoprazole (PROTONIX) 40 MG tablet TAKE 1 TABLET BY MOUTH TWICE DAILY  . rosuvastatin (CRESTOR) 20 MG tablet Take 1 tablet (20 mg total) by mouth at bedtime.  Marland Kitchen VICTOZA 18 MG/3ML SOPN INJECT 0.3 MILLILITER (1.8MG) SUBCUTANEOUSLY TO  SKIN  ONCE  DAILY  . [  ] gabapentin (NEURONTIN) 300 MG capsule Take 1 capsule (300 mg total) by mouth 3 (three) times daily.                      Objective:   Physical Exam    Obese bf occ throat clearing    03/26/2018     251   12/29/2017      250   11/16/17 256 lb (116.1 kg)  11/14/17 260 lb  11.2 oz (118.3 kg)  10/23/17 259 lb (117.5 kg)     Vital signs reviewed - Note on arrival 02 sats  93% on RA     HEENT: nl dentition,  and oropharynx. Nl external ear canals without cough reflex - mod turbinate edema bilaterally    NECK :  without JVD/Nodes/TM/ nl carotid upstrokes bilaterally   LUNGS: no acc muscle use,  Nl contour chest which is clear to A and P bilaterally without cough on insp or exp maneuvers   CV:  RRR  no s3 or murmur or increase in P2, and no edema   ABD:  soft and nontender with nl inspiratory excursion in the supine position. No bruits or organomegaly appreciated, bowel sounds nl  MS:  Nl gait/ ext warm without deformities, calf tenderness, cyanosis or clubbing No obvious joint restrictions   SKIN: warm and dry without lesions    NEURO:  alert, approp, nl sensorium with  no motor or  cerebellar deficits apparent.                  Assessment:

## 2018-03-26 NOTE — Patient Instructions (Addendum)
Increase gabapentin to 300 mg four times a day as per you new med calendar today   GERD (REFLUX)  is an extremely common cause of respiratory symptoms just like yours , many times with no obvious heartburn at all.    It can be treated with medication, but also with lifestyle changes including elevation of the head of your bed (ideally with 6 inch  bed blocks),  Smoking cessation, avoidance of late meals, excessive alcohol, and avoid fatty foods, chocolate, peppermint, colas, red wine, and acidic juices such as orange juice.  NO MINT OR MENTHOL PRODUCTS SO NO COUGH DROPS   USE SUGARLESS CANDY INSTEAD (Jolley ranchers or Stover's or Life Savers) or even ice chips will also do - the key is to swallow to prevent all throat clearing. NO OIL BASED VITAMINS - use powdered substitutes.  Please remember to go to the lab department downstairs in the basement  for your tests - we will call you with the results when they are available.   Please schedule a follow up office visit in 4 weeks, sooner if needed - bring your med calendar with you that is  dated today and keep up with it - enter any changes in meantime from other doctors if changes are made

## 2018-03-27 LAB — RESPIRATORY ALLERGY PROFILE REGION II ~~LOC~~
ALLERGEN, COTTONWOOD, T14: 0.16 kU/L — AB
ALLERGEN, OAK, T7: 0.14 kU/L — AB
Allergen, A. alternata, m6: <0.1 kU/L
Allergen, Cedar tree, t12: 0.13 kU/L — ABNORMAL HIGH
Allergen, Comm Silver Birch, t9: 0.12 kU/L — ABNORMAL HIGH
Allergen, D pternoyssinus,d7: <0.1 kU/L
Allergen, Mouse Urine Protein, e78: <0.1 kU/L
Aspergillus fumigatus, m3: <0.1 kU/L
BERMUDA GRASS: 0.2 kU/L — AB
Box Elder IgE: 0.14 kU/L — ABNORMAL HIGH
CLASS: 0
CLASS: 0
CLASS: 0
CLASS: 0
CLASS: 0
CLASS: 0
CLASS: 0
CLASS: 0
CLASS: 1
CLASS: 2
COMMON RAGWEED (SHORT) (W1) IGE: 3.56 kU/L — AB
Cat Dander: <0.1 kU/L
Class: 0
Class: 0
Class: 0
Class: 0
Class: 0
Class: 0
Class: 0
Class: 0
Class: 0
Class: 0
Class: 0
Class: 0
Class: 0
Class: 3
Cockroach: 0.98 kU/L — ABNORMAL HIGH
D. farinae: <0.1 kU/L
Elm IgE: 0.12 kU/L — ABNORMAL HIGH
IGE (IMMUNOGLOBULIN E), SERUM: 27 kU/L (ref <=114)
JOHNSON GRASS: 0.25 kU/L — AB
Pecan/Hickory Tree IgE: 0.15 kU/L — ABNORMAL HIGH
Rough Pigweed  IgE: <0.1 kU/L
Sheep Sorrel IgE: 0.11 kU/L — ABNORMAL HIGH
Timothy Grass: 0.4 kU/L — ABNORMAL HIGH

## 2018-03-27 LAB — INTERPRETATION:

## 2018-03-27 NOTE — Progress Notes (Signed)
LMTCB

## 2018-03-28 ENCOUNTER — Encounter: Payer: Self-pay | Admitting: Internal Medicine

## 2018-03-28 ENCOUNTER — Telehealth: Payer: Self-pay | Admitting: Internal Medicine

## 2018-03-28 NOTE — Progress Notes (Signed)
LMTCB

## 2018-03-28 NOTE — Telephone Encounter (Signed)
Call patient : Studies are c/w very mild allergies to ragweed, trees/ grass > no change rx Be sure patient has/keeps f/u ov so we can go over all the details of this study and get a plan together moving forward - ok to move up f/u if not feeling better and wants to be seen sooner   --------------  lmtcb for pt to relay results/recs.

## 2018-03-28 NOTE — Assessment & Plan Note (Signed)
Quit smoking 07/1998 PFTs  May 24, 2013  that showed moderate airflow obstruction with FEV1 is 1.35 L/59%. Ratio 62. No sign change BD  - Spirometry 11/16/2017  FEV1 1.33 (62%)  Ratio 69 p am symbicort  - 11/16/2017  After extensive coaching inhaler device  effectiveness =    90%   No recent symptoms attributable to copd so no change rx    > 50 % of 25 min spent counseling

## 2018-03-28 NOTE — Assessment & Plan Note (Signed)
Body mass index is 45.91 kg/m.  -  trending up slightly  Lab Results  Component Value Date   TSH 1.36 07/27/2016     Contributing to gerd risk/ doe/reviewed the need and the process to achieve and maintain neg calorie balance > defer f/u primary care including intermittently monitoring thyroid status

## 2018-03-28 NOTE — Assessment & Plan Note (Addendum)
CT sinus 02/23/17 neg for sinus dz - Allergy profile3/14 /2019 >  Eos 0.2 /  IgE  27  RAST pos ragweed/ grass/ trees  - FENO 11/16/2017  =  12 - Spirometry 11/16/2017  FEV1 1.33 (62%)  Ratio 69 p am symb 80 x 2 > changed to prn 11/16/17  - 11/16/2017 trial of gabapentin 300 tid / max gerd rx/ 1st gen H1 blockers per guidelines   > did not follow instructions > repeat same rx 12/29/2017   - Allergy profile 03/26/2018 >  Eos 0.2 /  IgE 27 RAST pos ragwee, grass, tree   - 03/26/2018 increased gabapentin to 300 mg qid   Continue to strongly support dx of Upper airway cough syndrome (previously labeled PNDS),  is so named because it's frequently impossible to sort out how much is  CR/sinusitis with freq throat clearing (which can be related to primary GERD)   vs  causing  secondary (" extra esophageal")  GERD from wide swings in gastric pressure that occur with throat clearing, often  promoting self use of mint and menthol lozenges that reduce the lower esophageal sphincter tone and exacerbate the problem further in a cyclical fashion.   These are the same pts (now being labeled as having "irritable larynx syndrome" by some cough centers) who not infrequently have a history of having failed to tolerate ace inhibitors,  dry powder inhalers or biphosphonates or report having atypical/extraesophageal reflux symptoms that don't respond to standard doses of PPI  and are easily confused as having aecopd or asthma flares by even experienced allergists/ pulmonologists (myself included).    She is still in a cycle of throat clearing promoting reflux /overt HB promoting more throat clearing so rec  Gabapentin 300 mg qid if tol Avoid all mint products (repeated instructions reviewed line by line as did not get the message the first time)   Each maintenance medication was reviewed in detail including most importantly the difference between maintenance and as needed and under what circumstances the prns are to be used. This  was done in the context of a medication calendar review which provided the patient with a user-friendly unambiguous mechanism for medication administration and reconciliation and provides an action plan for all active problems. It is critical that this be shown to every doctor  for modification during the office visit if necessary so the patient can use it as a working document.

## 2018-03-29 NOTE — Telephone Encounter (Signed)
Spoke with pt and notified of results per Dr. Wert. Pt verbalized understanding and denied any questions. 

## 2018-04-10 ENCOUNTER — Encounter: Payer: Self-pay | Admitting: Internal Medicine

## 2018-04-10 ENCOUNTER — Other Ambulatory Visit: Payer: Self-pay

## 2018-04-10 ENCOUNTER — Ambulatory Visit (INDEPENDENT_AMBULATORY_CARE_PROVIDER_SITE_OTHER): Payer: PPO | Admitting: Internal Medicine

## 2018-04-10 VITALS — BP 113/44 | HR 95 | Temp 97.9°F | Ht 64.0 in | Wt 247.2 lb

## 2018-04-10 DIAGNOSIS — M545 Low back pain, unspecified: Secondary | ICD-10-CM

## 2018-04-10 DIAGNOSIS — Z9641 Presence of insulin pump (external) (internal): Secondary | ICD-10-CM | POA: Diagnosis not present

## 2018-04-10 DIAGNOSIS — Z794 Long term (current) use of insulin: Secondary | ICD-10-CM

## 2018-04-10 DIAGNOSIS — G8929 Other chronic pain: Secondary | ICD-10-CM

## 2018-04-10 DIAGNOSIS — M4317 Spondylolisthesis, lumbosacral region: Secondary | ICD-10-CM

## 2018-04-10 DIAGNOSIS — E785 Hyperlipidemia, unspecified: Secondary | ICD-10-CM

## 2018-04-10 DIAGNOSIS — Z79891 Long term (current) use of opiate analgesic: Secondary | ICD-10-CM

## 2018-04-10 DIAGNOSIS — I10 Essential (primary) hypertension: Secondary | ICD-10-CM | POA: Diagnosis not present

## 2018-04-10 DIAGNOSIS — Z791 Long term (current) use of non-steroidal anti-inflammatories (NSAID): Secondary | ICD-10-CM | POA: Diagnosis not present

## 2018-04-10 DIAGNOSIS — J449 Chronic obstructive pulmonary disease, unspecified: Secondary | ICD-10-CM

## 2018-04-10 DIAGNOSIS — E1140 Type 2 diabetes mellitus with diabetic neuropathy, unspecified: Secondary | ICD-10-CM | POA: Diagnosis not present

## 2018-04-10 DIAGNOSIS — Z01818 Encounter for other preprocedural examination: Secondary | ICD-10-CM

## 2018-04-10 DIAGNOSIS — K219 Gastro-esophageal reflux disease without esophagitis: Secondary | ICD-10-CM | POA: Diagnosis not present

## 2018-04-10 MED ORDER — OXYCODONE-ACETAMINOPHEN 5-325 MG PO TABS: 1.0000 | ORAL_TABLET | Freq: Three times a day (TID) | ORAL | 0 refills | Status: DC | PRN

## 2018-04-10 NOTE — Patient Instructions (Signed)
It was a pleasure taking care of you today, [name]!  1. You are at low-risk for surgical complications for your upcoming spinal surgery. We will fax this information to Dr. Patrice Paradise.   2. Your blood sugars have continued to be variable throughout the day. These wide fluctuations are most likely from diet. I have made a referral to our diabetes educator Butch Penny. She can help you with resources to better control your blood sugars. Keep taking your diabetes medications as prescribed. You will be due for another A1c in about a month.   3. I have refilled your Percocet for your back pain. Please remember the terms of our pain contract.  4. Please follow-up in about one month for A1c and other lab work.   Feel free to call our clinic at [phone] if you have any questions.  Thanks! Dr. Annie Paras

## 2018-04-10 NOTE — Assessment & Plan Note (Signed)
[name] has continued to have chronic and stable low back pain. She was prescribed 50 pills of Percocet at her last visit on 02/06/18. She reports that she tries to use these sparingly. On good days, she doesn't take any. On bad days, she will take up to 3 pills. She also uses ibuprofen as needed to spare her Percocet. She reports that she is almost out of the prescription from two months ago and would like a refill. We reviewed her pain contract together today.   [name] was evaluated by Dr. Patrice Paradise of [facility] on 03/07/18. According to the neurosurgery consult note, the patient has L4-L5, L5-S1 spondylolisthesis with MRI findings suggestive of instability.  This is likely the cause of her long-standing and debilitating back pain. Dr. Patrice Paradise recommends L4-L5, L5-S1 posterior-lateral spinal fusion and has requested pre-operative assessment. The patient has only one clinical risk factor, insulin-dependent diabetes. She has no other risk factors including heart failure, CAD, or CVA. Her functional status is at least 4 mets because she is able to climb a flight of stairs without issue. The proposed surgery is intermediate risk. According to the [facility] algorithm, she does not require any other pre-operative testing.  Plan 1. Refill Percocet 50 pills 2. Will contact Dr. Towanda Malkin office with the results of my pre-operative assessment

## 2018-04-10 NOTE — Assessment & Plan Note (Addendum)
The patient reports that her sugars have been high since she was last seen in clinic. She reports that sometimes her sugar will be in the 300s or even 400s, although this is rare. She has had two episodes of "feeling shaky" with blood sugars under 80 over the past two months. She reports that she has had less appetite because of her back pain. She often skips meals during the day, and does not take insulin at these times. She also has been getting less physical activity because of her pain. Her current diabetes regimen includes Vgo 30 with J-009 insulin (3 clicks 3 times a day with meals and 2 clicks three times a day with snacks), Invokamet 50-1,000 BID, Januvia 100mg  daily, and Victoza 1.8mg  daily. She reports that she was on Lantus years ago and she was better controlled, however she was unable to afford Lantus, which led to her current regimen.   Assessment: A1c two months ago was 8.5. CGM data today shows a glucose average of 199 with 2 hypoglycemic episodes (lowest of 69). Her blood glucoses vary widely at each time point during the day. This pattern is likely caused by her diet and she would benefit from further diabetes education with [place] before making medication changes. The patient is amenable to this referral.   Plan 1. Continue current diabetes medications as listed above 2. Referral to [facility] for nutrition and insulin titration education 3. Recheck A1c in 1 month

## 2018-04-10 NOTE — Progress Notes (Signed)
   CC: pre-op assessment  HPI:  [name] is a 57 y.o. female with a medical history of HTN, COPD, DMII, HLD, GERD, and chronic back pain who presents for pre-operative assessment. Her diabetes was also discussed at this visit. Please see problem based charting for the status of the patient's current and chronic medical conditions.  Past Medical History:  Diagnosis Date  . Allergy   . Arthritis    back   . Asthma    AS CHILD  . Chronic back pain   . Chronic leg pain    due to back pain  . Cocaine abuse ([place])    in remission  . Diabetes ([place])    with neuropathy  . GERD (gastroesophageal reflux disease)   . Hyperlipidemia   . Hypertension   . Neuromuscular disorder ([facility])    neuropathy  . RECTAL BLEEDING 12/09/2008   Annotation: s/p EGD 7/08 mild gastritis, s/p colonoscopy 7/08- benign polyp  s/p polypectomy and isolated diverticulum.  Qualifier: Diagnosis of  By: Ditzler RN, Debra    . Sleep apnea    CPAP    YEARS AGO DONE 1/2 YEARS AGO AND WAS TOLD DID NOT HAVE  . Tobacco abuse   . Uterine fibroid    s/p hysterectomy    Review of Systems:   Pertinent positives mentioned in HPI. Remainder of all ROS negative.   Physical Exam: Vitals:   04/10/18 1340  BP: (!) 113/44  Pulse: 95  Temp: 97.9 F (36.6 C)  TempSrc: Oral  SpO2: 97%  Weight: 247 lb 3.2 oz (112.1 kg)  Height: 5\' 4"  (1.626 m)   Physical Exam  Constitutional: Well-developed, well-nourished, and in no distress.  Eyes: Pupils are equal, round, and reactive to light. EOM are normal.  Cardiovascular: Normal rate and regular rhythm. No murmurs, rubs, or gallops. Pulmonary/Chest: Effort normal. Clear to auscultation bilaterally. No wheezes, rales, or rhonchi. Abdominal: Bowel sounds present. Soft, non-distended, non-tender. Ext: No lower extremity edema. Skin: Warm and dry. No rashes or wounds.   Assessment & Plan:   See Encounters Tab for problem based charting.  Patient seen with Dr. Lynnae January

## 2018-04-11 NOTE — Progress Notes (Signed)
Internal Medicine Clinic Attending  I saw and evaluated the patient.  I personally confirmed the key portions of the history and exam documented by Dr. Dorrell and I reviewed pertinent patient test results.  The assessment, diagnosis, and plan were formulated together and I agree with the documentation in the resident's note. 

## 2018-04-17 ENCOUNTER — Telehealth: Payer: Self-pay | Admitting: Internal Medicine

## 2018-04-17 NOTE — Telephone Encounter (Signed)
Pt requesting a medication for her Vaginal Itching, Burning and Frequent Urination, and would like for a nurse to call her back.

## 2018-04-18 NOTE — Telephone Encounter (Signed)
Called pt back , notified she will need to be seen in office, she is requesting an appt tomorrow in [facility] [place], appt made.  [place], RN

## 2018-04-18 NOTE — Telephone Encounter (Signed)
Returned call to patient, no answer.  Left message on self identifying VM to return call.  SChaplin,RN,BSN

## 2018-04-18 NOTE — Telephone Encounter (Signed)
Received return phone call from pt.  C/O vaginal itching, burning, and frequent urination since Monday.  Denies odor.  Was seen in office on 9/23 for dx of yeast infection.  Last office visit was 10/29.  Pt requesting RX, informed pt MD may want pt to be seen for evaluation and UA .  Will route to pcp. Amada [place], RN

## 2018-04-18 NOTE — Telephone Encounter (Signed)
Can she come to an [facility] appointment today or tomorrow? I'm in continuity clinic this afternoon, but will be in [facility] tomorrow.

## 2018-04-19 ENCOUNTER — Other Ambulatory Visit: Payer: Self-pay | Admitting: *Deleted

## 2018-04-19 ENCOUNTER — Other Ambulatory Visit: Payer: Self-pay

## 2018-04-19 ENCOUNTER — Encounter: Payer: Self-pay | Admitting: Internal Medicine

## 2018-04-19 ENCOUNTER — Other Ambulatory Visit (HOSPITAL_COMMUNITY)
Admission: RE | Admit: 2018-04-19 | Discharge: 2018-04-19 | Disposition: A | Discharge status: Home / Self Care | Payer: PPO | Source: Ambulatory Visit | Attending: Internal Medicine | Admitting: Internal Medicine

## 2018-04-19 ENCOUNTER — Ambulatory Visit (INDEPENDENT_AMBULATORY_CARE_PROVIDER_SITE_OTHER): Payer: PPO | Admitting: Internal Medicine

## 2018-04-19 VITALS — BP 116/72 | HR 95 | Temp 97.8°F | Ht 64.0 in | Wt 247.5 lb

## 2018-04-19 DIAGNOSIS — R3915 Urgency of urination: Secondary | ICD-10-CM

## 2018-04-19 DIAGNOSIS — M549 Dorsalgia, unspecified: Secondary | ICD-10-CM

## 2018-04-19 DIAGNOSIS — E119 Type 2 diabetes mellitus without complications: Secondary | ICD-10-CM

## 2018-04-19 DIAGNOSIS — R1030 Lower abdominal pain, unspecified: Secondary | ICD-10-CM | POA: Diagnosis not present

## 2018-04-19 DIAGNOSIS — I10 Essential (primary) hypertension: Secondary | ICD-10-CM | POA: Diagnosis not present

## 2018-04-19 DIAGNOSIS — K219 Gastro-esophageal reflux disease without esophagitis: Secondary | ICD-10-CM

## 2018-04-19 DIAGNOSIS — J449 Chronic obstructive pulmonary disease, unspecified: Secondary | ICD-10-CM | POA: Diagnosis not present

## 2018-04-19 DIAGNOSIS — Z8742 Personal history of other diseases of the female genital tract: Secondary | ICD-10-CM

## 2018-04-19 DIAGNOSIS — R350 Frequency of micturition: Secondary | ICD-10-CM | POA: Diagnosis not present

## 2018-04-19 DIAGNOSIS — Z87440 Personal history of urinary (tract) infections: Secondary | ICD-10-CM

## 2018-04-19 DIAGNOSIS — R300 Dysuria: Secondary | ICD-10-CM

## 2018-04-19 DIAGNOSIS — Z8619 Personal history of other infectious and parasitic diseases: Secondary | ICD-10-CM

## 2018-04-19 DIAGNOSIS — G8929 Other chronic pain: Secondary | ICD-10-CM | POA: Diagnosis not present

## 2018-04-19 DIAGNOSIS — E785 Hyperlipidemia, unspecified: Secondary | ICD-10-CM | POA: Diagnosis not present

## 2018-04-19 LAB — POCT URINALYSIS DIPSTICK
Bilirubin, UA: NEGATIVE
Glucose, UA: POSITIVE — AB
KETONES UA: NEGATIVE
LEUKOCYTES UA: NEGATIVE
Nitrite, UA: NEGATIVE
PH UA: 5 (ref 5.0–8.0)
PROTEIN UA: NEGATIVE
SPEC GRAV UA: 1.01 (ref 1.010–1.025)
Urobilinogen, UA: 0.2 E.U./dL

## 2018-04-19 MED ORDER — CEFDINIR 300 MG PO CAPS: 300.0000 mg | ORAL_CAPSULE | Freq: Two times a day (BID) | ORAL | 0 refills | Status: AC

## 2018-04-19 MED ORDER — PANTOPRAZOLE SODIUM 40 MG PO TBEC: 40.0000 mg | DELAYED_RELEASE_TABLET | Freq: Two times a day (BID) | ORAL | 0 refills | Status: DC

## 2018-04-19 MED ORDER — IRBESARTAN 150 MG PO TABS: 150.0000 mg | ORAL_TABLET | Freq: Every day | ORAL | 3 refills | Status: DC

## 2018-04-19 NOTE — Assessment & Plan Note (Addendum)
Ms. Belmares reports dysuria, urinary urgency, urinary frequency, lower abdominal pain, and bilateral back pain that has been present for about 4 days. She denies fevers, chills, hematuria, or malodorous urine. She states that it feels like her vaginal area is "raw" and "scalded" when she urinates. She was treated for a yeast infection on 03/05/18. Her symptoms of vaginal itching completely resolved after taking diflucan 150mg  x2. She reports that this episode feels different than the yeast infection, which was more itchy than painful. She has not had a UTI in years. She has been on canagliflozin for 3-4 months. 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 When she notices itching around her anus, she starts taking acyclovir. She reports that she did notice some anal itching yesterday and started taking the acyclovir this morning. However, the other symptoms are not typical for her HSV flares.  Assessment: Pt is afebrile. No ulcerations or vesicles seen on exam. DDx includes UTI given the characteristic symptoms of dysuria, frequency, urgency, suprapubic pain, and CVA tenderness. DDx also includes BV, trich, gonorrhea, chlamydia, and HSV. Yeast infection is less likely, but also possible. She is at increased risk for genitourinary infections 2/2 SGLT2 inhibitor.   UA glucose > 1,000, ketones negative, nitrites negative, leukocytes negative. Sending urine for culture and cytology.  Plan 1. Urine culture and urine cytology 2. Will await the results of the urine cytology to determine antibiotic management. If BV or trichomoniasis are present, will give flagyl. If cytology is negative, will treat empirically for UTI with 5-day course of Omnicef. 3. Advised the patient to continue with her acyclovir until she is past the anal HSV flare.  ADDENDUM: Urine culture negative. Cytology unremarkable. Given her classic symptoms for UTI with abdominal and bilateral flank pain, will treat with Omnicef.  However, her dysuria may be caused by HSV outbreak.

## 2018-04-19 NOTE — Patient Instructions (Addendum)
It was a pleasure taking care of you today, [name]!  You most likely have an infection of your urinary tract or of your vagina that is causing your symptoms. - We have sent for more urine studies that will be performed. I will call you with the results - I have sent a prescription for a 5-day course of an antibiotic called omnicef. You can take 300mg  of this twice a day for 5 days. Wait until I call you with the results of your urine studies before picking up this prescription. Plan to start it tomorrow. - The urine studies may show a different problem that requires a different type of antibiotic. If that is what you need, I will send that to your pharmacy when I get the results back. - Continue taking the acyclovir for the itchy flare you've started to feel. You can take 400mg  three times a day until the flare has passed.  Feel free to call our clinic at [phone] if you have any questions.  Thanks, Dr. Annie Paras

## 2018-04-19 NOTE — Telephone Encounter (Signed)
Received faxed refill request from pt's pharmacy-will send to pcp for consideration, please advise.[name], Crystal Quin Cassady11/7/20192:44 PM

## 2018-04-19 NOTE — Progress Notes (Signed)
   CC: urinary frequency  HPI:   [name] is a 57 y.o. female with a medical history of HTN, COPD, DMII, HLD, GERD, and chronic back pain who presents to the internal medicine clinic for four days of dysuria, urinary frequency, and urinary urgency. Please see problem based charting for the status of the patient's current and chronic medical conditions.   Past Medical History:  Diagnosis Date  . Allergy   . Arthritis    back   . Asthma    AS CHILD  . Chronic back pain   . Chronic leg pain    due to back pain  . Cocaine abuse ([place])    in remission  . Diabetes ([place])    with neuropathy  . GERD (gastroesophageal reflux disease)   . Hyperlipidemia   . Hypertension   . Neuromuscular disorder ([facility])    neuropathy  . RECTAL BLEEDING 12/09/2008   Annotation: s/p EGD 7/08 mild gastritis, s/p colonoscopy 7/08- benign polyp  s/p polypectomy and isolated diverticulum.  Qualifier: Diagnosis of  By: Ditzler RN, Debra    . Sleep apnea    CPAP    YEARS AGO DONE 1/2 YEARS AGO AND WAS TOLD DID NOT HAVE  . Tobacco abuse   . Uterine fibroid    s/p hysterectomy    Review of Systems:   Pertinent positives mentioned in HPI. Remainder of all ROS negative.  Physical Exam: Vitals:   04/19/18 0852  BP: 116/72  Pulse: 95  Temp: 97.8 F (36.6 C)  TempSrc: Oral  SpO2: 98%  Weight: 247 lb 8 oz (112.3 kg)  Height: 5\' 4"  (1.626 m)   Physical Exam  Constitutional: Well-developed, well-nourished, and in no distress.  Eyes: Pupils are equal, round, and reactive to light. EOM are normal.  Cardiovascular: Normal rate and regular rhythm. No murmurs, rubs, or gallops. Pulmonary/Chest: Effort normal. Clear to auscultation bilaterally. No wheezes, rales, or rhonchi. Abdominal: Bowel sounds present. Soft, non-distended. Mild TTP in the suprapubic region. Back: Bilateral CVA tenderness. GU: Mild erythema of the skin between the labia and the anus. No skin tears, ulcerations, or vesicles. No vaginal  discharge or malodor. Skin: Warm and dry. No rashes or wounds.   Assessment & Plan:   See Encounters Tab for problem based charting.  Patient seen with Dr. Daryll Drown

## 2018-04-20 LAB — URINALYSIS, ROUTINE W REFLEX MICROSCOPIC
BILIRUBIN UA: NEGATIVE
KETONES UA: NEGATIVE
Leukocytes, UA: NEGATIVE
NITRITE UA: NEGATIVE
Protein, UA: NEGATIVE
RBC UA: NEGATIVE
Specific Gravity, UA: >=1.03 — AB (ref 1.005–1.030)
UUROB: 0.2 mg/dL (ref 0.2–1.0)
pH, UA: 5 (ref 5.0–7.5)

## 2018-04-20 LAB — URINE CYTOLOGY ANCILLARY ONLY
Chlamydia: NEGATIVE
Neisseria Gonorrhea: NEGATIVE
TRICH (WINDOWPATH): NEGATIVE

## 2018-04-20 NOTE — Progress Notes (Signed)
Internal Medicine Clinic Attending  I saw and evaluated the patient.  I personally confirmed the key portions of the history and exam documented by Dr. Dorrell and I reviewed pertinent patient test results.  The assessment, diagnosis, and plan were formulated together and I agree with the documentation in the resident's note. 

## 2018-04-21 LAB — URINE CULTURE

## 2018-04-23 ENCOUNTER — Ambulatory Visit (INDEPENDENT_AMBULATORY_CARE_PROVIDER_SITE_OTHER): Payer: PPO | Admitting: Internal Medicine

## 2018-04-23 ENCOUNTER — Encounter: Payer: Self-pay | Admitting: Internal Medicine

## 2018-04-23 VITALS — BP 132/74 | HR 104 | Ht 64.0 in | Wt 250.8 lb

## 2018-04-23 DIAGNOSIS — R05 Cough: Secondary | ICD-10-CM

## 2018-04-23 DIAGNOSIS — J449 Chronic obstructive pulmonary disease, unspecified: Secondary | ICD-10-CM

## 2018-04-23 DIAGNOSIS — R053 Chronic cough: Secondary | ICD-10-CM

## 2018-04-23 NOTE — Patient Instructions (Addendum)
No changes needed based on your issues today.  No mint gum!   See calendar for specific medication instructions and bring it back for each and every office visit for every healthcare provider you see.  Without it,  you may not receive the best quality medical care that we feel you deserve.  You will note that the calendar groups together  your maintenance  medications that are timed at particular times of the day.  Think of this as your checklist for what your doctor has instructed you to do until your next evaluation to see what benefit  there is  to staying on a consistent group of medications intended to keep you well.  The other group at the bottom is entirely up to you to use as you see fit  for specific symptoms that may arise between visits that require you to treat them on an as needed basis.  Think of this as your action plan or "what if" list.   Separating the top medications from the bottom group is fundamental to providing you adequate care going forward.     Please schedule a follow up visit in 6  months but call sooner if needed

## 2018-04-23 NOTE — Progress Notes (Signed)
Subjective:     Patient ID: [name], female   DOB: Nov 10, 1960,  MRN: [id number]    Brief patient profile:  67 yobf quit smoking 07/1998 says born with asthma remembers freq er trips and allergy eval by Dr Bernita Buffy on shots with reduction in need for "the pump" for breathing / some better after quit smoking then  referred to pulmonary clinic 11/16/2017 by Dr   Chase Caller for refractory cough since around 2015   History of Present Illness  11/16/2017 1st  office visit/ [name]   Chief Complaint  Patient presents with  . Pulmonary Consult    Second opinion on cough per Dr Chase Caller. She states she has been coughing for the past 2-3 years. She states she occ produces some clear sputum. She has noticed that the cough can be triggered by talking. It will occ wake her up in the night.   has been eval by S wright  Breathing is fine as long as not coughing   [name] Reflux v Neurogenic Cough Differentiator Reflux Comments  Do you awaken from a sound sleep coughing violently?                            With trouble breathing? 2 x per week no   Do you have choking episodes when you cannot  Get enough air, gasping for air ?              no   Do you usually cough when you lie down into  The bed, or when you just lie down to rest ?                          sometimes   Do you usually cough after meals or eating?         2-3 week   Do you cough when (or after) you bend over?    no   GERD SCORE     [name] Reflux v Neurogenic Cough Differentiator Neurogenic   Do you more-or-less cough all day long? sporadic   Does change of temperature make you cough? Yes hot   Does laughing or chuckling cause you to cough? yes   Do fumes (perfume, automobile fumes, burned  Toast, etc.,) cause you to cough ?      yes   Does speaking, singing, or talking on the phone cause you to cough   ?               Sometimes    Neurogenic/Airway score      rec Change gabapentin to 300 mg three times daily  For drainage /  throat tickle try take CHLORPHENIRAMINE  4 mg - take one every 4 hours as needed - available over the counter- may cause drowsiness so start with just a bedtime dose or two and see how you tolerate it before trying in daytime   Stop  nexium  And continue pantoprazole 40 mg Take 30- 60 min before your first and last meals of the day and Zantac (ranitidine) at bedtime  Change symbicort 80 to where you take it up to 2 puff every 12 hours only if you feel you really need it for breathing or if it helps your cough GERD diet  Please schedule a follow up office visit in 6 weeks, call sooner if needed with all medications /inhalers/ solutions in hand so we can verify exactly what  you are taking. This includes all medications from all doctors and over the [place] separate them into two bags:  the ones you take automatically, no matter what, vs the ones you take just when you feel you need them "BAG #2 is UP TO YOU"  - this will really help [place] help you take your medications more effectively.      12/29/2017  f/u ov/[name] re: cough since 2015 / brought meds but totally confused concept of maint vs prns and did not restart gabapentin / no worse off symbicort  Chief Complaint  Patient presents with  . Follow-up    Cough had improved some and then worsened again 2 wks ago- occ prod with clear sputum. She is using her albuterol inhaler 1 x per wk on average.   cough never completely resolved / still using lots of  mints Dyspnea:  Only when coughing or steps = MMRC1 = can walk nl pace, flat grade, can't hurry or go uphills or steps s sob   Cough is dry day > noct esp with use of voice/ laughter  Has not needed saba much at all since stopped symbicort  rec Start  gabapentin to 300 mg three times daily  For drainage / throat tickle continue CHLORPHENIRAMINE  4 mg - take one every 4 hours as needed - available over the counter- may cause drowsiness so   Continue bedtime dose  X  two  Then take it every 4  hours during the day just  as needed for throat tickle  Stop  nexium  And continue pantoprazole 40 mg Take 30- 60 min before your first and last meals of the day and Zantac (ranitidine) at bedtime  GERD  See [name] in 4 weeks with all your medications>  Medication calendar      03/26/2018  f/u ov/[name] re: chewing mint gum/ occ throat clearing / did not bring med calendar  Chief Complaint  Patient presents with  . Follow-up    Cough has resovled. She rarely uses her albuterol inhaler.   Dyspnea:  Steps x one flight and stop at top/ back stops her first  if walking flat surface Cough: not at all at noct/ just using one h1 per day and 2 at hs  Sleeping: fine on cpap / p 2 chlorpheniramine 68m x 2   SABA use:  Very rare inhaler / has symbicort not using  Overt hb  despite ppi bid   rec Increase gabapentin to 300 mg four times a day as per you new med calendar today GERD diet  Please schedule a follow up office visit in 4 weeks, sooner if needed - bring your med calendar with you that is  dated today and keep up with it - enter any changes in meantime from other doctors if changes are made       04/23/2018  f/u ov/[name] re: uacs  Doing fine off maint rx for asthma  Chief Complaint  Patient presents with  . Follow-up    follows for chronic cough, patient clears throat contantly. uses albuterol very seldom   Dyspnea:  MMRC1 = can walk nl pace, flat grade, can't hurry or go uphills or steps s sob   Cough: some throat clearing never wakes her up  Sleeping: is ok on cpap  SABA use: rare  02: none   Says best she's been in years, very rarely needs saba    No obvious day to day or daytime variability  or assoc excess/ purulent sputum or mucus plugs or hemoptysis or cp or chest tightness, subjective wheeze or overt sinus or hb symptoms.   Sleeping as above  without nocturnal  or early am exacerbation  of respiratory  c/o's or need for noct saba. Also denies any obvious fluctuation of  symptoms with weather or environmental changes or other aggravating or alleviating factors except as outlined above   No unusual exposure hx or h/o childhood pna/ asthma or knowledge of premature birth.  Current Allergies, Complete Past Medical History, Past Surgical History, Family History, and Social History were reviewed in [employer] record.  ROS  The following are not active complaints unless bolded Hoarseness, sore throat, dysphagia, dental problems, itching, sneezing,  nasal congestion or discharge of excess mucus or purulent secretions, ear ache,   fever, chills, sweats, unintended wt loss or wt gain, classically pleuritic or exertional cp,  orthopnea pnd or arm/hand swelling  or leg swelling, presyncope, palpitations, abdominal pain, anorexia, nausea, vomiting, diarrhea  or change in bowel habits or change in bladder habits, change in stools or change in urine, dysuria, hematuria,  rash, arthralgias, visual complaints, headache, numbness, weakness or ataxia or problems with walking or coordination,  change in mood or  memory.        Current Meds  Medication Sig  . acyclovir (ZOVIRAX) 400 MG tablet Take 1 tablet (400 mg total) by mouth 3 (three) times daily as needed (flares).  Marland Kitchen albuterol (PROVENTIL HFA;VENTOLIN HFA) 108 (90 Base) MCG/ACT inhaler INHALE 2 PUFFS BY MOUTH EVERY 4 HOURS AS NEEDED FOR WHEEZING OR SHORTNESS OF BREATH  . aspirin 81 MG EC tablet Take 81 mg by mouth daily.    . Blood Glucose Monitoring Suppl (ONETOUCH VERIO FLEX SYSTEM) w/Device KIT 1 each by Does not apply route 3 (three) times daily.  . Canagliflozin-metFORMIN HCl (INVOKAMET) 50-1000 MG TABS Take 50-1,000 mg by mouth 2 (two) times daily.  . cefdinir (OMNICEF) 300 MG capsule Take 1 capsule (300 mg total) by mouth 2 (two) times daily for 5 days.  . chlorpheniramine (CHLOR-TRIMETON) 4 MG tablet Take 4 mg by mouth every 4 (four) hours as needed for allergies.  . Continuous Blood Gluc  Receiver (FREESTYLE LIBRE 14 DAY READER) DEVI 1 each by Does not apply route 4 (four) times daily.  . Continuous Blood Gluc Sensor (FREESTYLE LIBRE 14 DAY SENSOR) MISC 1 each by Does not apply route 4 (four) times daily.  . Continuous Glucose Monitor DEVI Use as directed.  . gabapentin (NEURONTIN) 300 MG capsule Take 1 capsule (300 mg total) by mouth 4 (four) times daily.  Marland Kitchen glucose blood (ONETOUCH VERIO) test strip Check blood sugar 3 times a day  . hydrochlorothiazide (HYDRODIURIL) 25 MG tablet Take 1 tablet (25 mg total) by mouth daily.  . Insulin Disposable Pump (V-GO 30) KIT 1 kit by Does not apply route daily. Fill and place daily with insulin, use 4 clicks with meals and 2 clicks with snacks  . Insulin Pen Needle (B-D UF III MINI PEN NEEDLES) 31G X 5 MM MISC Use to inject insulin twice a day  . insulin regular human CONCENTRATED (HUMULIN R) 500 UNIT/ML injection Use to fill Vgo daily.  Use 3-4 clicks with breakfast, 3-4 clicks with lunch, and 4-5 clicks with dinner.  Use 2 clicks with snacks twice daily.  . Insulin Syringe-Needle U-100 (INSULIN SYRINGE 1CC/30GX1/2") 30G X 1/2" 1 ML MISC Use to fill Vgo daily  . irbesartan (AVAPRO) 150 MG  tablet Take 1 tablet (150 mg total) by mouth daily.  Marland Kitchen JANUVIA 100 MG tablet Take 100 mg by mouth daily.   . Multiple Vitamin (MULTIVITAMIN WITH MINERALS) TABS tablet Take 1 tablet by mouth daily.  Glory Rosebush DELICA LANCETS FINE MISC Check blood sugar 3 times a day  . oxyCODONE-acetaminophen (PERCOCET/ROXICET) 5-325 MG tablet Take 1 tablet by mouth every 8 (eight) hours as needed. For back pain. Fill prescription 30 days from last dispense date.  . pantoprazole (PROTONIX) 40 MG tablet Take 1 tablet (40 mg total) by mouth 2 (two) times daily.  Marland Kitchen VICTOZA 18 MG/3ML SOPN INJECT 0.3 MILLILITER (1.8MG) SUBCUTANEOUSLY TO  SKIN  ONCE  DAILY              Objective:   Physical Exam    Obese bf chewing mint gum  03/26/2018     251   12/29/2017      250    11/16/17 256 lb (116.1 kg)  11/14/17 260 lb 11.2 oz (118.3 kg)  10/23/17 259 lb (117.5 kg)     Vital signs reviewed - Note on arrival 02 sats  93% on RA   HEENT: nl dentition, turbinates bilaterally, and oropharynx. Nl external ear canals without cough reflex   NECK :  without JVD/Nodes/TM/ nl carotid upstrokes bilaterally   LUNGS: no acc muscle use,  Nl contour chest which is clear to A and P bilaterally without cough on insp or exp maneuvers   CV:  RRR  no s3 or murmur or increase in P2, and no edema   ABD:  soft and nontender with nl inspiratory excursion in the supine position. No bruits or organomegaly appreciated, bowel sounds nl  MS:  Nl gait/ ext warm without deformities, calf tenderness, cyanosis or clubbing No obvious joint restrictions   SKIN: warm and dry without lesions    NEURO:  alert, approp, nl sensorium with  no motor or cerebellar deficits apparent.             Assessment:

## 2018-04-24 LAB — URINE CYTOLOGY ANCILLARY ONLY: Candida vaginitis: NEGATIVE

## 2018-04-25 ENCOUNTER — Encounter: Payer: Self-pay | Admitting: Internal Medicine

## 2018-04-25 ENCOUNTER — Telehealth: Payer: Self-pay | Admitting: Internal Medicine

## 2018-04-25 NOTE — Assessment & Plan Note (Addendum)
CT sinus 02/23/17 neg for sinus dz - Allergy profile3/14 /2019 >  Eos 0.2 /  IgE  27  RAST pos ragweed/ grass/ trees  - FENO 11/16/2017  =  12 - Spirometry 11/16/2017  FEV1 1.33 (62%)  Ratio 69 p am symb 80 x 2 > changed to prn 11/16/17  - 11/16/2017 trial of gabapentin 300 tid / max gerd rx/ 1st gen H1 blockers per guidelines   > did not follow instructions > repeat same rx 12/29/2017  - Allergy profile 03/26/2018 >  Eos 0.2 /  IgE 27 RAST pos ragweed, grass, tree   - 03/26/2018 increased gabapentin to 300 mg qid> improved 04/23/2018 so no change rx    Adequate control on present rx, reviewed in detail with pt > no change in rx needed      >>> f/u can be in 6 months, sooner if needed

## 2018-04-25 NOTE — Assessment & Plan Note (Signed)
Body mass index is 43.05 kg/m.  -  trending up still  Lab Results  Component Value Date   TSH 1.36 07/27/2016     Contributing to gerd risk/ doe/reviewed the need and the process to achieve and maintain neg calorie balance > defer f/u primary care including intermittently monitoring thyroid status      I had an extended discussion with the patient reviewing all relevant studies completed to date and  lasting 15 to 20 minutes of a 25 minute visit    Each maintenance medication was reviewed in detail including most importantly the difference between maintenance and prns and under what circumstances the prns are to be triggered using an action plan format that is not reflected in the computer generated alphabetically organized AVS but trather by a customized med calendar that reflects the AVS meds with confirmed 100% correlation.   In addition, Please see AVS for unique instructions that I personally wrote and verbalized to the the pt in detail and then reviewed with pt  by my nurse highlighting any  changes in therapy recommended at today's visit to their plan of care.

## 2018-04-25 NOTE — Assessment & Plan Note (Signed)
Quit smoking 07/1998 PFTs  May 24, 2013  that showed moderate airflow obstruction with FEV1 is 1.35 L/59%. Ratio 62. No sign change BD  - Spirometry 11/16/2017  FEV1 1.33 (62%)  Ratio 69 p am symbicort  - 11/16/2017  After extensive coaching inhaler device  effectiveness =    90%  - 04/23/2018 reported doing fine off all inhalers > just use saba prn    I reviewed the Fletcher curve with the patient that basically indicates  if you quit smoking when your best day FEV1 is still well preserved (as is clearly  the case here)  it is highly unlikely you will progress to severe disease and informed the patient there was  no medication on the market that has proven to alter the curve/ its downward trajectory  or the likelihood of progression of their disease(unlike other chronic medical conditions such as atheroclerosis where we do think we can change the natural hx with risk reducing meds)    Therefore stopping smoking and maintaining abstinence are  the most important aspects of care, not choice of inhalers or for that matter, doctors.   Treatment other than smoking cessation  is entirely directed by severity of symptoms and focused also on reducing exacerbations, not attempting to change the natural history of the disease.    Since not having symptoms attributable to COPD or exacerbations suggesting risk for aecopd it's fine just use albuterol as needed here.

## 2018-04-25 NOTE — Telephone Encounter (Signed)
Ms. Bassinger urine cytology came back positive for Gardnerella vaginalis. I called the patient who just completed her course of [place] yesterday. She reports that her symptoms have resolved. I informed her of the results of the urine cytology. Because she is currently asymptomatic, will not start flagyl at this time. I advised the patient to call back if her symptoms recur. At that time, I will prescribe a course of flagyl for BV.

## 2018-05-03 ENCOUNTER — Encounter: Payer: PPO | Admitting: Dietician

## 2018-05-03 ENCOUNTER — Ambulatory Visit: Payer: PPO

## 2018-05-07 DIAGNOSIS — M7582 Other shoulder lesions, left shoulder: Secondary | ICD-10-CM | POA: Diagnosis not present

## 2018-05-09 ENCOUNTER — Other Ambulatory Visit: Payer: Self-pay | Admitting: Internal Medicine

## 2018-05-09 DIAGNOSIS — E1140 Type 2 diabetes mellitus with diabetic neuropathy, unspecified: Secondary | ICD-10-CM

## 2018-05-09 DIAGNOSIS — Z794 Long term (current) use of insulin: Principal | ICD-10-CM

## 2018-05-13 ENCOUNTER — Other Ambulatory Visit: Payer: Self-pay | Admitting: Internal Medicine

## 2018-05-13 DIAGNOSIS — Z794 Long term (current) use of insulin: Principal | ICD-10-CM

## 2018-05-13 DIAGNOSIS — E1140 Type 2 diabetes mellitus with diabetic neuropathy, unspecified: Secondary | ICD-10-CM

## 2018-05-15 ENCOUNTER — Other Ambulatory Visit: Payer: Self-pay | Admitting: *Deleted

## 2018-05-15 DIAGNOSIS — Z794 Long term (current) use of insulin: Principal | ICD-10-CM

## 2018-05-15 DIAGNOSIS — E1140 Type 2 diabetes mellitus with diabetic neuropathy, unspecified: Secondary | ICD-10-CM

## 2018-05-15 NOTE — Telephone Encounter (Signed)
Next appt scheduled  06/19/18 with PCP.

## 2018-05-18 ENCOUNTER — Telehealth: Payer: Self-pay | Admitting: *Deleted

## 2018-05-18 MED ORDER — INSULIN PEN NEEDLE 31G X 5 MM MISC: 5 refills | Status: DC

## 2018-05-18 MED ORDER — JANUVIA 100 MG PO TABS: 100.0000 mg | ORAL_TABLET | Freq: Every day | ORAL | 3 refills | Status: DC

## 2018-05-18 NOTE — Telephone Encounter (Signed)
Pt calls and states she is continuing to have trouble with invokana, she states dr knows what her problem is and it continues, she states dr told her she would change her medicine, could you do that? She also" needs some of that medicine in reference to that last test, she will know what im talking about"

## 2018-05-22 MED ORDER — METRONIDAZOLE 500 MG PO TABS: 500.0000 mg | ORAL_TABLET | Freq: Two times a day (BID) | ORAL | 0 refills | Status: AC

## 2018-05-22 NOTE — Telephone Encounter (Signed)
Spoke with the patient by telephone today. Her symptoms of vaginal discomfort and pruritus have returned. Cytology was positive for BV on 11/7. Since symptoms have returned will treat with 7-day course of flagyl 500mg  BID. If she continues to have vaginal and urinary infections after this treatment, will consider discontinuing Invokana. Scheduled for a DM f/u appt with me in early January. Patient expressed understanding of the treatment plan.

## 2018-05-23 ENCOUNTER — Other Ambulatory Visit: Payer: Self-pay | Admitting: *Deleted

## 2018-05-23 DIAGNOSIS — Z794 Long term (current) use of insulin: Principal | ICD-10-CM

## 2018-05-23 DIAGNOSIS — E1140 Type 2 diabetes mellitus with diabetic neuropathy, unspecified: Secondary | ICD-10-CM

## 2018-05-24 MED ORDER — INSULIN REGULAR HUMAN (CONC) 500 UNIT/ML ~~LOC~~ SOLN: SUBCUTANEOUS | 3 refills | Status: DC

## 2018-05-30 MED ORDER — INSULIN REGULAR HUMAN (CONC) 500 UNIT/ML ~~LOC~~ SOLN: SUBCUTANEOUS | 3 refills | Status: DC

## 2018-05-30 NOTE — Addendum Note (Signed)
Addended by: Velora Heckler on: 05/30/2018 11:23 AM   Modules accepted: Orders

## 2018-06-08 ENCOUNTER — Other Ambulatory Visit: Payer: Self-pay | Admitting: *Deleted

## 2018-06-08 DIAGNOSIS — E1140 Type 2 diabetes mellitus with diabetic neuropathy, unspecified: Secondary | ICD-10-CM

## 2018-06-08 DIAGNOSIS — Z794 Long term (current) use of insulin: Principal | ICD-10-CM

## 2018-06-08 NOTE — Telephone Encounter (Signed)
Pharmacy never received notice that refill was approved.  Rx phoned into pharmacy.[name]12/27/20194:13 PM

## 2018-06-08 NOTE — Progress Notes (Signed)
A user error has taken place: encounter opened in error, closed for administrative reasons.

## 2018-06-18 NOTE — Progress Notes (Addendum)
   CC: DM f/u  HPI:   [name] is a 58 y.o. female with a medical history of HTN, COPD, DMII, HLD, GERD, and chronic back pain who presents to the internal medicine clinic for DM f/u. We also discussed her acute vaginal irritation. Please see problem based charting for the history and status of the patient's current and chronic medical conditions.   Past Medical History:  Diagnosis Date  . Allergy   . Arthritis    back   . Asthma    AS CHILD  . Chronic back pain   . Chronic leg pain    due to back pain  . Cocaine abuse ([place])    in remission  . Diabetes ([place])    with neuropathy  . GERD (gastroesophageal reflux disease)   . Hyperlipidemia   . Hypertension   . Neuromuscular disorder ([facility])    neuropathy  . RECTAL BLEEDING 12/09/2008   Annotation: s/p EGD 7/08 mild gastritis, s/p colonoscopy 7/08- benign polyp  s/p polypectomy and isolated diverticulum.  Qualifier: Diagnosis of  By: Ditzler RN, Debra    . Sleep apnea    CPAP    YEARS AGO DONE 1/2 YEARS AGO AND WAS TOLD DID NOT HAVE  . Tobacco abuse   . Uterine fibroid    s/p hysterectomy    Review of Systems:   Pertinent positives mentioned in HPI. Remainder of all ROS negative.  Physical Exam: Vitals:   06/19/18 1435  BP: 127/75  Pulse: 100  Temp: 98 F (36.7 C)  TempSrc: Oral  SpO2: 99%  Weight: 250 lb 3.2 oz (113.5 kg)   Physical Exam  Constitutional: Obese female. No distress. Eyes:Pupils are equal, round, and reactive to light.EOMare normal.  Cardiovascular:Normal rateand regular rhythm. No murmurs, rubs, or gallops. Pulmonary/Chest:Effort normal. Clear to auscultation bilaterally. No wheezes, rales, or rhonchi. Abdominal: Bowel sounds present. Soft, non-distended, non-tender. Back: No CVA tenderness. GU: Mild erythema of the skin between the labia and the anus. No skin tears, ulcerations, or vesicles. No vaginal discharge or malodor.  Skin: Warm and dry. No rashes or wounds.   Assessment & Plan:     See Encounters Tab for problem based charting.  Patient discussed with Dr. Evette Doffing

## 2018-06-19 ENCOUNTER — Encounter: Payer: Self-pay | Admitting: Internal Medicine

## 2018-06-19 ENCOUNTER — Encounter: Payer: Self-pay | Admitting: Dietician

## 2018-06-19 ENCOUNTER — Other Ambulatory Visit (HOSPITAL_COMMUNITY)
Admission: RE | Admit: 2018-06-19 | Discharge: 2018-06-19 | Disposition: A | Discharge status: Home / Self Care | Payer: PPO | Source: Ambulatory Visit | Attending: Student in an Organized Health Care Education/Training Program | Admitting: Student in an Organized Health Care Education/Training Program

## 2018-06-19 ENCOUNTER — Ambulatory Visit (INDEPENDENT_AMBULATORY_CARE_PROVIDER_SITE_OTHER): Payer: PPO | Admitting: Internal Medicine

## 2018-06-19 ENCOUNTER — Ambulatory Visit (INDEPENDENT_AMBULATORY_CARE_PROVIDER_SITE_OTHER): Payer: PPO | Admitting: Dietician

## 2018-06-19 VITALS — BP 127/75 | HR 100 | Temp 98.1°F | Wt 250.2 lb

## 2018-06-19 DIAGNOSIS — Z79899 Other long term (current) drug therapy: Secondary | ICD-10-CM

## 2018-06-19 DIAGNOSIS — Z794 Long term (current) use of insulin: Secondary | ICD-10-CM | POA: Diagnosis not present

## 2018-06-19 DIAGNOSIS — J449 Chronic obstructive pulmonary disease, unspecified: Secondary | ICD-10-CM | POA: Diagnosis not present

## 2018-06-19 DIAGNOSIS — Z87440 Personal history of urinary (tract) infections: Secondary | ICD-10-CM | POA: Diagnosis not present

## 2018-06-19 DIAGNOSIS — Z9641 Presence of insulin pump (external) (internal): Secondary | ICD-10-CM

## 2018-06-19 DIAGNOSIS — M545 Low back pain, unspecified: Secondary | ICD-10-CM

## 2018-06-19 DIAGNOSIS — N898 Other specified noninflammatory disorders of vagina: Secondary | ICD-10-CM | POA: Diagnosis not present

## 2018-06-19 DIAGNOSIS — K219 Gastro-esophageal reflux disease without esophagitis: Secondary | ICD-10-CM

## 2018-06-19 DIAGNOSIS — E1140 Type 2 diabetes mellitus with diabetic neuropathy, unspecified: Secondary | ICD-10-CM

## 2018-06-19 DIAGNOSIS — R32 Unspecified urinary incontinence: Secondary | ICD-10-CM | POA: Diagnosis not present

## 2018-06-19 DIAGNOSIS — M549 Dorsalgia, unspecified: Secondary | ICD-10-CM

## 2018-06-19 DIAGNOSIS — I10 Essential (primary) hypertension: Secondary | ICD-10-CM

## 2018-06-19 DIAGNOSIS — B373 Candidiasis of vulva and vagina: Secondary | ICD-10-CM | POA: Diagnosis not present

## 2018-06-19 DIAGNOSIS — G8929 Other chronic pain: Secondary | ICD-10-CM | POA: Diagnosis not present

## 2018-06-19 DIAGNOSIS — E785 Hyperlipidemia, unspecified: Secondary | ICD-10-CM | POA: Diagnosis not present

## 2018-06-19 DIAGNOSIS — Z713 Dietary counseling and surveillance: Secondary | ICD-10-CM

## 2018-06-19 DIAGNOSIS — Z79891 Long term (current) use of opiate analgesic: Secondary | ICD-10-CM | POA: Diagnosis not present

## 2018-06-19 LAB — POCT GLYCOSYLATED HEMOGLOBIN (HGB A1C): Hemoglobin A1C: 9.9 % — AB (ref 4.0–5.6)

## 2018-06-19 LAB — GLUCOSE, CAPILLARY: Glucose-Capillary: 269 mg/dL — ABNORMAL HIGH (ref 70–99)

## 2018-06-19 MED ORDER — OXYCODONE-ACETAMINOPHEN 5-325 MG PO TABS: 1.0000 | ORAL_TABLET | Freq: Three times a day (TID) | ORAL | 0 refills | Status: DC | PRN

## 2018-06-19 MED ORDER — METFORMIN HCL 1000 MG PO TABS: 1000.0000 mg | ORAL_TABLET | Freq: Two times a day (BID) | ORAL | 1 refills | Status: DC

## 2018-06-19 MED ORDER — HYDROCHLOROTHIAZIDE 25 MG PO TABS: 25.0000 mg | ORAL_TABLET | Freq: Every day | ORAL | 3 refills | Status: DC

## 2018-06-19 MED ORDER — V-GO 40 KIT: 1.0000 | PACK | Freq: Every day | 2 refills | Status: DC

## 2018-06-19 NOTE — Assessment & Plan Note (Addendum)
HPI: Patient reports a "scalded" and "raw" sensation in her vagina. She also describes urinary incontinence. She has been experiencing these symptoms for the past two weeks. She denies dysuria, hematuria, vaginal discharge, abdominal pain, back pain. She reports that this feels similar to her previous vaginal infections. She has not tried any medications for this at home. She has been treated for vaginal candidiasis and bacterial vaginosis over the past few months. She says her symptoms resolve after treatment, but come back within weeks.  Assessment: Recurrent vaginal infections likely 2/2 hyperglycemia and canagliflozin. DDx includes UTI, vaginal candidiasis, bacterial vaginosis, or other pelvic infection.  Plan 1. Discontinue canagliflozin 2. Continue to improve glycemic control (see DM assessment) 3. Wet prep today for candida, BV, trich, and GC/CT 4. UA   Addendum: Positive for candida. Treat with diflucan 150mg  once. Repeat in 3 days if not resolved.

## 2018-06-19 NOTE — Patient Instructions (Addendum)
Goals to help you to achieve your goals of ___to lower my blood sugars_____:  1- Please scan at least 4 times a day- every 4 hours while you are awake 2- Please fill the Vgo 40 with 80 units of u500 insulin 3-  Follow up with me in 3 weeks  Can upload weekly before you come to see me.  Vika [phone]

## 2018-06-19 NOTE — Assessment & Plan Note (Signed)
BP 127/75 today. At goal on current regimen.  Plan - Continue hctz 25mg  daily and potassium supplementation - Continue irbesartan 150mg  daily

## 2018-06-19 NOTE — Assessment & Plan Note (Addendum)
HPI: Patient is requesting a refill of her Percocet for back pain. She reports that her back pain is unchanged. She has good days and bad days. She does not take the Percocet every day, but only on bad days. The pain improves with the Norco and she is able to be more active. She last took Percocet 2 days ago. She is considering having her spinal fusion with neurosurgery in the summer, but is concerned about the amount of recovery time.  Assessment: Patient used 50 tablets over the last two months, so she is requiring roughly 25 tablets per month. The pain medication is increasing her functional status and decreasing her pain.    Plan 1. Percocet 5-325mg , 25 tablets. Three separate time-stamped prescriptions sent to last at least 3 months. 2. ToxAssure urine test today. 3. F/u in 3 months

## 2018-06-19 NOTE — Assessment & Plan Note (Addendum)
A1c is increased from 8.5 to 9.9 today. She wears CGM and checks her sugars 3-4 times a day. She says they are very variable throughout the day. On review of her CGM, her blood sugars are highest in the morning, but are high throughout the whole day. She has had no hypoglycemic episodes. In fact, she has no history of hypoglycemia. Her current regimen includes Vgo30 (with which she gets 150u basal insulin and 180 units prandial insulin throughout the day), Victoza 1.8mg  daily, Invokamet, and Januvia. To improve glycemic control, will increase Vgo. Will discontinue canagliflozin and switch to metformin alone as she has had multiple vaginal infections since starting in June of 2019. Will also discontinue her Januvia as she is already on Victoza.  Plan 1. Discontinue Invokamet 2. Continue metformin 1,000mg  BID on its own 3. Discontinue Januvia 4. Continue Victoza 1.8mg  daily 5. Increase from Vgo30 to Vgo40 (with which she will get 200u of basal insulin and 180u of prandial insulin) 6. Appointment with Catriona this afternoon for Vgo 40 education as well as dietary education. 7. F/u in 3 months for repeat A1c

## 2018-06-19 NOTE — Patient Instructions (Addendum)
It was a pleasure seeing you today, Ms. Posa!  1. For your diabetes - Stop taking Invokamet (this is the medication that is likely causing your infections) - Stop taking Januvia - Start taking metformin 1,062m twice a day  - Continue taking Victoza - We will increase your Vgo kit. Please see DRebaat your appointment this afternoon for more information on this. - Please decrease your sugar and carbohydrate intake  2. For your ongoing infections - I will call you with the results of your tests and can prescribe something for you based on those results.  3. For your chronic back pain - I have refilled your pain medication - We will do a urine test today per our pain contract  F/u with me in 3 months.   Feel free to call our clinic at [phone] you have any questions!  Thanks, Dr. DAnnie Paras

## 2018-06-20 LAB — URINALYSIS, ROUTINE W REFLEX MICROSCOPIC
Bilirubin, UA: NEGATIVE
KETONES UA: NEGATIVE
Leukocytes, UA: NEGATIVE
Nitrite, UA: NEGATIVE
Protein, UA: NEGATIVE
RBC UA: NEGATIVE
Specific Gravity, UA: >=1.03 — AB (ref 1.005–1.030)
UUROB: 0.2 mg/dL (ref 0.2–1.0)
pH, UA: 5 (ref 5.0–7.5)

## 2018-06-20 LAB — CERVICOVAGINAL ANCILLARY ONLY
BACTERIAL VAGINITIS: NEGATIVE
Candida vaginitis: POSITIVE — AB
Chlamydia: NEGATIVE
Neisseria Gonorrhea: NEGATIVE
Trichomonas: NEGATIVE

## 2018-06-20 NOTE — Progress Notes (Signed)
Diabetes Self-Management Education  Visit Type:  Follow-up  Appt. Start Time: 1615 Appt. End Time: 6440  06/20/2018  [name], identified by name and date of birth, is a 58 y.o. female with a diagnosis of Diabetes:  .   ASSESSMENT Lab Results  Component Value Date   HGBA1C 9.9 (A) 06/19/2018  CGM report results show average of 60% above target range.   Diabetes Self-Management Education - 06/20/18 0900      Psychosocial Assessment   Patient Belief/Attitude about Diabetes  Motivated to manage diabetes    Self-management support  Doctor's office;Church;CDE visits    Patient Concerns  Medication;Weight Control    Learning Readiness  Change in progress      Pre-Education Assessment   Patient understands the diabetes disease and treatment process.  Needs Review    Patient understands incorporating nutritional management into lifestyle.  Needs Review   Follow-up with [facility]   Patient understands using medications safely.  Needs Review    Patient understands monitoring blood glucose, interpreting and using results  Needs Review      Complications   How often do you check your blood sugar?  1-2 times/day    Fasting Blood glucose range (mg/dL)  >200    Number of hypoglycemic episodes per month  0      Patient Education   Previous Diabetes Education  Yes (please comment)   See chart   Disease state   Explored patient's options for treatment of their diabetes    Medications  Taught/reviewed insulin injection, site rotation, insulin storage and needle disposal.;Reviewed medication adjustment guidelines for hyperglycemia and sick days.    Monitoring  Purpose and frequency of SMBG.;Taught/discussed recording of test results and interpretation of SMBG.;Identified appropriate SMBG and/or A1C goals.      Individualized Goals (developed by patient)   Medications  take my medication as prescribed   Increase Vgo from 30 to 40    Monitoring   test my blood glucose as discussed   Patients  goal is to check blood sugars every 4 hours     Post-Education Assessment   Patient understands the diabetes disease and treatment process.  Needs Review    Patient understands using medications safely.  Demonstrates understanding / competency    Patient understands monitoring blood glucose, interpreting and using results  Needs Review   Follow-up, consider guidelines for correction     Outcomes   Program Status  Not Completed      Subsequent Visit   Since your last visit have you continued or begun to take your medications as prescribed?  Yes    Since your last visit have you experienced any weight changes?  No change    Since your last visit, are you checking your blood glucose at least once a day?  Yes       Learning Objective:  Patient will have a greater understanding of diabetes self-management. Patient education plan is to attend individual and/or group sessions per assessed needs and concerns.   Plan:   Patient Instructions  Goals to help you to achieve your goals of ___to lower my blood sugars_____:  1- Please scan at least 4 times a day- every 4 hours while you are awake 2- Please fill the Vgo 40 with 80 units of u500 insulin 3-  Follow up with me in 3 weeks  Can upload weekly before you come to see me.  [name] [phone]     Expected Outcomes:  Demonstrated interest in  learning. Expect positive outcomes  Education material provided:  After visit summary  If problems or questions, patient to contact team via:  Phone  Future DSME appointment: - 4-6 wks   Debera Lat, RD 06/20/2018 10:19 AM.

## 2018-06-20 NOTE — Progress Notes (Signed)
Internal Medicine Clinic Attending  Case discussed with Dr. Dorrell at the time of the visit.  We reviewed the resident's history and exam and pertinent patient test results.  I agree with the assessment, diagnosis, and plan of care documented in the resident's note.    

## 2018-06-21 MED ORDER — FLUCONAZOLE 150 MG PO TABS: 150.0000 mg | ORAL_TABLET | Freq: Every day | ORAL | 0 refills | Status: DC

## 2018-06-21 NOTE — Addendum Note (Signed)
Addended by: [name] on: 06/21/2018 06:13 AM   Modules accepted: Orders

## 2018-06-23 LAB — TOXASSURE SELECT,+ANTIDEPR,UR

## 2018-07-13 ENCOUNTER — Telehealth: Payer: Self-pay | Admitting: Dietician

## 2018-07-13 NOTE — Telephone Encounter (Signed)
Called to see how [name] was doing with her insulin dosing and scanning her CGM. she uploaded her cgm today. feels she is doing better with scanning but could still do better. The report she uploaded today showed an improvement form 44 to 64% in scanning.   Her blood sugars are improving, her average is now 202 mg/dl for the past 14 days. She had two low blood sugars neither one was she symptomatic. She thinks she took her dinner 3 clicks and didn't eat enough, but then she followed by saying" I really don't know why it happened" . Plan: recording her carbs and insulin in her CGM reader. She agreed to upload next Friday and we'd touch base again after that to reveiw her data. Debera Lat, RD 07/13/2018 4:39 PM.

## 2018-07-18 ENCOUNTER — Other Ambulatory Visit: Payer: Self-pay

## 2018-07-18 ENCOUNTER — Telehealth: Payer: Self-pay | Admitting: Dietician

## 2018-07-18 MED ORDER — ALBUTEROL SULFATE HFA 108 (90 BASE) MCG/ACT IN AERS: 2.0000 | INHALATION_SPRAY | RESPIRATORY_TRACT | 2 refills | Status: DC | PRN

## 2018-07-18 NOTE — Telephone Encounter (Signed)
[name] would prefer to use a paper food record rather than her [employer] carb grams record. She asks that I try to send a food record vi Mr Chart and mail one if I cannot.

## 2018-07-18 NOTE — Telephone Encounter (Signed)
albuterol (PROVENTIL HFA;VENTOLIN HFA) 108 (90 Base) MCG/ACT inhaler   REFILL REQUEST @  [place], [place] - [address] [phone] (Phone) [phone] (Fax)

## 2018-07-27 ENCOUNTER — Telehealth: Payer: Self-pay | Admitting: Dietician

## 2018-07-27 NOTE — Telephone Encounter (Signed)
Left message to follow up on food records, uploading data, supporting her desire to make changes.

## 2018-07-28 ENCOUNTER — Other Ambulatory Visit: Payer: Self-pay | Admitting: Internal Medicine

## 2018-07-28 DIAGNOSIS — K219 Gastro-esophageal reflux disease without esophagitis: Secondary | ICD-10-CM

## 2018-07-30 ENCOUNTER — Telehealth: Payer: Self-pay | Admitting: Dietician

## 2018-07-30 ENCOUNTER — Telehealth: Payer: Self-pay | Admitting: *Deleted

## 2018-07-30 NOTE — Telephone Encounter (Signed)
[name] feedback about her blood sugars uploaded to [employer]. They are continuing to improve.  July 17, 2018 - July 30, 2018 14 Days % Time CGM is Active 70% ( at goal)   Each 5% increase in time in range (70-180 mg/dL) is clinically beneficial. Average Glucose 191 mg/dL Glucose Management Indicator (GMI) 7.9 % Glucose Variability 23.2%  TIME IN RANGES High 181-250 mg/dL 55% (13h 40min) Target Range 70-180 mg/dL 37% (8h 30min) Goal 70% Low 54-69 mg/dL 0% (30min) Very High >250 mg/dL 8% (1h 110min) Very Low <54 mg/dL 0   She received the food records and will begin tracking her food intake. We'll try to touch base again in 1-2 weeks

## 2018-07-31 NOTE — Telephone Encounter (Signed)
Signed. Thank you.

## 2018-08-10 ENCOUNTER — Telehealth: Payer: Self-pay | Admitting: Dietician

## 2018-08-10 NOTE — Telephone Encounter (Signed)
[name] called to tell us that she had uploaded her personal CGM from home. Her blood sugars continue to improve. "I am trying".   From her upload:  Average Glucose 189 mg/dL  Glucose Management Indicator (GMI) 7.8 % Glucose Variability 27.2% Very High >250 mg/dL 9% (2h 28min) High 181-250 mg/dL 54% (12h 66min) Target Range 70-180 mg/dL 37% (8h 31min) Low 54-69 mg/dL 0% (27min)  She reports walking, limiting portions and a meal schedule has helped her. She plans to bring in her food records to her March appointments with Dr. Annie Paras and I.  Debera Lat, RD 08/10/2018 5:19 PM.

## 2018-08-20 NOTE — Progress Notes (Signed)
   CC: DM f/u  HPI:   [name] is a 58 y.o.  female with a medical history ofHTN, COPD, DMII, HLD, GERD, and chronic back painwho presents to the internal medicine clinic for DM f/u. She has no acute concerns today. Please see problem based charting for the history and status of the patient's current and chronic medical conditions.   Past Medical History:  Diagnosis Date  . Allergy   . Arthritis    back   . Asthma    AS CHILD  . Chronic back pain   . Chronic leg pain    due to back pain  . Cocaine abuse ([place])    in remission  . Diabetes ([place])    with neuropathy  . GERD (gastroesophageal reflux disease)   . Hyperlipidemia   . Hypertension   . Neuromuscular disorder ([facility])    neuropathy  . RECTAL BLEEDING 12/09/2008   Annotation: s/p EGD 7/08 mild gastritis, s/p colonoscopy 7/08- benign polyp  s/p polypectomy and isolated diverticulum.  Qualifier: Diagnosis of  By: Ditzler RN, Debra    . Sleep apnea    CPAP    YEARS AGO DONE 1/2 YEARS AGO AND WAS TOLD DID NOT HAVE  . Tobacco abuse   . Uterine fibroid    s/p hysterectomy    Review of Systems:   Pertinent positives mentioned in HPI. Remainder of all ROS negative.  Physical Exam: Vitals:   08/21/18 1321  BP: (!) 168/74  Pulse: (!) 109  Temp: 99.3 F (37.4 C)  TempSrc: Oral  SpO2: 97%  Weight: 253 lb 9.6 oz (115 kg)  Height: 5\' 4"  (1.626 m)   Physical Exam  Constitutional: Obese female, no distress. Eyes: Pupils are equal, round, and reactive to light. EOM are normal.  Cardiovascular: Normal rate and regular rhythm. No murmurs, rubs, or gallops. Pulmonary/Chest: Effort normal. Clear to auscultation bilaterally. No wheezes, rales, or rhonchi. Abdominal: Bowel sounds present. Soft, non-distended, non-tender. Ext: No lower extremity edema. Skin: Warm and dry. No rashes or wounds.   Assessment & Plan:   See Encounters Tab for problem based charting.  Patient discussed with Dr. Angelia Mould

## 2018-08-21 ENCOUNTER — Encounter: Payer: Self-pay | Admitting: Internal Medicine

## 2018-08-21 ENCOUNTER — Ambulatory Visit (INDEPENDENT_AMBULATORY_CARE_PROVIDER_SITE_OTHER): Payer: PPO | Admitting: Dietician

## 2018-08-21 ENCOUNTER — Ambulatory Visit (INDEPENDENT_AMBULATORY_CARE_PROVIDER_SITE_OTHER): Payer: PPO | Admitting: Internal Medicine

## 2018-08-21 ENCOUNTER — Encounter: Payer: Self-pay | Admitting: Dietician

## 2018-08-21 VITALS — BP 168/74 | HR 109 | Temp 99.3°F | Ht 64.0 in | Wt 253.6 lb

## 2018-08-21 DIAGNOSIS — Z6841 Body Mass Index (BMI) 40.0 and over, adult: Secondary | ICD-10-CM

## 2018-08-21 DIAGNOSIS — Z794 Long term (current) use of insulin: Secondary | ICD-10-CM | POA: Diagnosis not present

## 2018-08-21 DIAGNOSIS — M549 Dorsalgia, unspecified: Secondary | ICD-10-CM | POA: Diagnosis not present

## 2018-08-21 DIAGNOSIS — E1140 Type 2 diabetes mellitus with diabetic neuropathy, unspecified: Secondary | ICD-10-CM

## 2018-08-21 DIAGNOSIS — K219 Gastro-esophageal reflux disease without esophagitis: Secondary | ICD-10-CM

## 2018-08-21 DIAGNOSIS — J449 Chronic obstructive pulmonary disease, unspecified: Secondary | ICD-10-CM

## 2018-08-21 DIAGNOSIS — Z79891 Long term (current) use of opiate analgesic: Secondary | ICD-10-CM | POA: Diagnosis not present

## 2018-08-21 DIAGNOSIS — I10 Essential (primary) hypertension: Secondary | ICD-10-CM

## 2018-08-21 DIAGNOSIS — G8929 Other chronic pain: Secondary | ICD-10-CM | POA: Diagnosis not present

## 2018-08-21 DIAGNOSIS — M545 Low back pain: Secondary | ICD-10-CM

## 2018-08-21 DIAGNOSIS — E785 Hyperlipidemia, unspecified: Secondary | ICD-10-CM

## 2018-08-21 DIAGNOSIS — Z713 Dietary counseling and surveillance: Secondary | ICD-10-CM | POA: Diagnosis not present

## 2018-08-21 DIAGNOSIS — Z79899 Other long term (current) drug therapy: Secondary | ICD-10-CM | POA: Diagnosis not present

## 2018-08-21 DIAGNOSIS — Z0000 Encounter for general adult medical examination without abnormal findings: Secondary | ICD-10-CM

## 2018-08-21 LAB — GLUCOSE, CAPILLARY: Glucose-Capillary: 279 mg/dL — ABNORMAL HIGH (ref 70–99)

## 2018-08-21 LAB — POCT GLYCOSYLATED HEMOGLOBIN (HGB A1C): HEMOGLOBIN A1C: 9.7 % — AB (ref 4.0–5.6)

## 2018-08-21 MED ORDER — LIRAGLUTIDE -WEIGHT MANAGEMENT 18 MG/3ML ~~LOC~~ SOPN: 3.0000 mg | PEN_INJECTOR | Freq: Every day | SUBCUTANEOUS | 0 refills | Status: DC

## 2018-08-21 MED ORDER — OXYCODONE-ACETAMINOPHEN 5-325 MG PO TABS: 1.0000 | ORAL_TABLET | Freq: Three times a day (TID) | ORAL | 0 refills | Status: AC | PRN

## 2018-08-21 MED ORDER — OXYCODONE-ACETAMINOPHEN 5-325 MG PO TABS: 1.0000 | ORAL_TABLET | Freq: Three times a day (TID) | ORAL | 0 refills | Status: DC | PRN

## 2018-08-21 NOTE — Patient Instructions (Addendum)
Your Freestyle report shows that you are consistently scanning 3 times a day and sometimes more.  Keep working to increase your scans to 4 or more times a day.  NEW VICTOZA dosing-   Starting tomorrow- increase Victoza to 2.4mg /day by taking 2     Injections of 1.8 ansd 0.6 for 1 week  Next Wednesday (08/29/2018), if tolerating ( no nausea) increase the Victoza dose to 3mg /day by taking 2 injections of 1.8mg  and 1.2mg  each day until we meet again.   Continue to upload your information each Friday before noon.  Follow up to see [place] in 4 weeks. Made on April 8 at [place] am.   [name] [phone]

## 2018-08-21 NOTE — Patient Instructions (Signed)
It was a pleasure seeing you today, [name]!  1. For your diabetes - Stop taking Victoza - Start taking Saxenda. Start with 2.4mg  weekly and then increase to 3mg  weekly. - Talk with Ladora about the number of clicks you are using  2. For your high blood pressure - Make sure you take your medications every day   3. For your back pain - I will refill your medication   Please f/u in 2 months  Thanks, Dr. Annie Paras

## 2018-08-21 NOTE — Progress Notes (Signed)
Diabetes Self-Management Education  Visit Type:  Follow-up  Appt. Start Time: 1425 Appt. End Time: 7510  08/21/2018  [name], identified by name and date of birth, is a 58 y.o. female with a diagnosis of Diabetes:  .Type 2     ASSESSMENT  Discussed options for diabetes and obesity treatment. [name] reminds me that she doe snot want to have weight loss surgery. She also does not want to be on diabetes medicines that cause weight gain.  Lab Results  Component Value Date   HGBA1C 9.7 (A) 08/21/2018   HGBA1C 9.9 (A) 06/19/2018   HGBA1C 8.5 (A) 02/06/2018   HGBA1C 8.4 (A) 11/14/2017   HGBA1C 8.8 08/15/2017    Wt Readings from Last 5 Encounters:  08/21/18 253 lb 9.6 oz (115 kg)  06/19/18 250 lb 3.2 oz (113.5 kg)  04/23/18 250 lb 12.8 oz (113.8 kg)  04/19/18 247 lb 8 oz (112.3 kg)  04/10/18 247 lb 3.2 oz (112.1 kg)     Diabetes Self-Management Education - 08/21/18 1500      Patient Education   Previous Diabetes Education  Yes (please comment)   here   Disease state   Explored patient's options for treatment of their diabetes    Nutrition management   Other (comment)   working on small changes-ie fruit for chips or candy   Medications  Reviewed patients medication for diabetes, action, purpose, timing of dose and side effects.    Monitoring  Other (comment)    Acute complications  Other (comment)   cgm     Subsequent Visit   Since your last visit have you continued or begun to take your medications as prescribed?  Yes    Since your last visit have you experienced any weight changes?  No change    Since your last visit, are you checking your blood glucose at least once a day?  Yes       Learning Objective:  Patient will have a greater understanding of diabetes self-management. Patient education plan is to attend individual and/or group sessions per assessed needs and concerns.  Plan:   Patient Instructions  Your Freestyle report shows that you are consistently  scanning 3 times a day and sometimes more.  Keep working to increase your scans to 4 or more times a day.  NEW VICTOZA dosing-   Starting tomorrow- increase Victoza to 2.4mg /day by taking 2     Injections of 1.8 ansd 0.6 for 1 week  Next Wednesday (08/29/2018), if tolerating ( no nausea) increase the Victoza dose to 3mg /day by taking 2 injections of 1.8mg  and 1.2mg  each day until we meet again.   Continue to upload your information each Friday before noon.  Follow up to see [place] in 4 weeks. Made on April 8 at 38 am.   [name] [phone]   Expected Outcomes:   Improvement in A1C/weight Education material provided:  After visit summary If problems or questions, patient to contact team via:  Phone Future DSME appointment: [name], RD 08/21/2018 3:42 PM.

## 2018-08-22 ENCOUNTER — Encounter: Payer: Self-pay | Admitting: Internal Medicine

## 2018-08-22 NOTE — Assessment & Plan Note (Signed)
BP elevated at 168/74 today. Ms. Vazguez reports that she did not take her blood pressure medications this morning because she forgot. She reports otherwise good compliance with them. As the patient has been well-controlled on this regimen previously, will defer escalating antihypertensive therapy at today's visit.   Plan - Continue hctz 25mg  daily with K supplement and irbesartan 150mg  daily - F/u in 2 months for repeat BP. If still elevated, consider escalating therapy at that time.
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
## 2018-08-22 NOTE — Assessment & Plan Note (Signed)
Patient states she gets mammograms every November. Her last was reportedly normal.

## 2018-08-22 NOTE — Assessment & Plan Note (Addendum)
A1c decreased from 9.9 three months ago to 9.7 today. [name] has been very motivated over the past few months to improve her glycemic control. She has been working on improving her diet. She has been sending weekly CBG reports to the diabetic educator Butch Penny. Overall, her CBGs show some improvement in her blood sugars. Her blood sugars are at a constant high level throughout the day without much fluctuation. She takes Vgo 40 three clicks with meals and two clicks with snacks for a total of 15 clicks per day. She has had one episode of hypoglycemia to 68. This occurred when she took her insulin and didn't eat. She felt weak and sweaty and was able to raise her sugar by drinking a soda.   The patient's diabetes is still uncontrolled. We discussed increasing her Vgo clicks throughout the day to use all 18 clicks and empty the pen. She may also benefit from switching from Victoza to [place] in order to increase her dose of liraglutide. She is currently on 1.8mg  daily and can get up to 3mg  daily with the Saxenda. While this may not have much benefit on her A1c, it can help aid with weight loss.   Plan - Increase Vgo 40 to 4 clicks TID with meals and 2 clicks TID with snacks - Discontinue Victoza. Start Saxenda at 2.4mg  daily for one week. Then increase to 3mg  daily. - Appt with [name] today - F/u with Butch Penny in 1 month - F/u in continuity clinic in two months

## 2018-08-24 NOTE — Progress Notes (Signed)
Internal Medicine Clinic Attending  Case discussed with Dr. Annie Paras at the time of the visit.  We reviewed the resident's history and exam and pertinent patient test results.  I agree with the assessment, diagnosis, and plan of care documented in the resident's note.  I personally reviewed the CGM data, the resident's interpretation, and agree with the intervention.  Overall as noted, will need to maximize clicks from VGo, no evidence of hypoglycemia on CGM from last 14 days.  OK to increase liruglatide given morbid obesity, I do not suspect it will change A1c much though.

## 2018-08-30 ENCOUNTER — Other Ambulatory Visit: Payer: Self-pay | Admitting: Internal Medicine

## 2018-09-04 ENCOUNTER — Telehealth: Payer: Self-pay | Admitting: Dietician

## 2018-09-04 NOTE — Telephone Encounter (Signed)
Called [name] to follow up on her diabetes self care and tolerance of new Victoza dose. She reports she did not uload her blood sugars last week because her Freestyle libre device malfunctioned. She has been taking  Viictoza 3 mg/day since Friday. Sugar had been 200s, Sunday dropped to low 200s.  185 fasting today. She thinks her blood sugars are improving.  Appetite- sometimes does not want to eat, but tries to eat at least Mixed nuts or orange. Bowel movement-daily.  Vgo-same clicks 2-7-6-7-0-1 Feels jittery sometimes, but checks sugar and it is fine. She agreed to let me know if she uploads her [name] this Friday. To confirm in office appointment scheduled for April. Debera Lat, RD 09/04/2018 9:36 AM.

## 2018-09-06 ENCOUNTER — Telehealth: Payer: Self-pay | Admitting: Internal Medicine

## 2018-09-06 ENCOUNTER — Encounter: Payer: Self-pay | Admitting: Internal Medicine

## 2018-09-06 NOTE — Telephone Encounter (Signed)
I received a message that [name] was asking for a fluconazole prescription when she spoke with the diabetes educator over the phone. I called her to inquire about her symptoms. I left a voice message for her to call the clinic back.

## 2018-09-07 ENCOUNTER — Other Ambulatory Visit: Payer: Self-pay | Admitting: Internal Medicine

## 2018-09-07 MED ORDER — V-GO 40 KIT: 1.0000 | PACK | Freq: Every day | 2 refills | Status: DC

## 2018-09-07 MED ORDER — FLUCONAZOLE 150 MG PO TABS: 150.0000 mg | ORAL_TABLET | Freq: Every day | ORAL | 0 refills | Status: DC

## 2018-09-07 NOTE — Progress Notes (Signed)
I received a patient message from [name] stating that she was having vaginal irritation, similar to what she experienced with her last yeast infection. As she has had recurrent yeast infections with positive candida on wet prep, I am comfortable prescribing her diflucan without an office visit. Diflucan 150mg  was sent to her pharmacy with instructions to take one pill and repeat in 3 days if symptoms do not resolve. I advised the patient to schedule a clinic appointment if her symptoms do not respond to this treatment or if she has dysuria, hematuria, back pain, or fevers.

## 2018-09-11 ENCOUNTER — Telehealth: Payer: Self-pay | Admitting: Internal Medicine

## 2018-09-11 NOTE — Telephone Encounter (Signed)
Called patient unable to reach LMTCB 

## 2018-09-12 NOTE — Telephone Encounter (Signed)
ATC pt, line rang busy x2. Will try back. 

## 2018-09-13 NOTE — Telephone Encounter (Signed)
ATC pt, line rang busy x2. We have attempted to contact pt several times with no success or call back from pt. Per triage protocol, message will be closed.  

## 2018-09-18 ENCOUNTER — Telehealth: Payer: Self-pay | Admitting: Dietician

## 2018-09-18 ENCOUNTER — Other Ambulatory Visit: Payer: Self-pay | Admitting: Internal Medicine

## 2018-09-18 ENCOUNTER — Other Ambulatory Visit: Payer: Self-pay | Admitting: *Deleted

## 2018-09-18 MED ORDER — LIRAGLUTIDE -WEIGHT MANAGEMENT 18 MG/3ML ~~LOC~~ SOPN: 3.0000 mg | PEN_INJECTOR | Freq: Every day | SUBCUTANEOUS | 1 refills | Status: DC

## 2018-09-18 NOTE — Telephone Encounter (Signed)
  [name] is a 58 y.o. female who was contacted on behalf of [facility] nutrition and diabetes services Telephone visit due to COVID-19. This is a diabetes follow up today instead of tomorrow.   I connected with  [name] on 09/18/18 by a  telephone and verified that I am speaking with the correct person using two identifiers. I discussed the limitations of evaluation and management by telephone. The patient expressed understanding and agreed to proceed.  Diet:  what meal plan do you follow? Mostly baked foods, teriyaki chicken, rice, collard greens and cabbage, fruit Oranges, apples, crackers, chicken salad and crackers, nuts  drink to care for your diabetes: mostly water, diet tea, G- gatorade What times do you eat? 9 am, snack- lunch 1230-1, snack- 2 30, dinner- 7-8 pm  Monitoring:  Are you checking blood sugars at home? Yes- Freestyle [name]   If so how often and when ? 3-4 times What are the results? [phone] symptoms of high and low blood sugar: gets confused starting Saturday   Medications:  what medications are you taking for diabetes? vgo40 with Ru500, victoza 1.8 and 1.2mg  each day. Lavora feels that the increased dose of victoza is helping.  Metformin 2x/day Are you getting your medicines filled on time without skipping any doses? no "Are you having any problems getting/taking your meds (cost, timing, transportation)?" no Do you need any meds refilled? No- does not have gabapentin due to pharmacy issue, has concern about rosuvastatin- healthteam advantage asked about leg cramps and arm pain.if she was having them to tell her doctor (she has arm/leg pains 2-3 times a week-) Metformin- has only 1 refill left, also  would like to ask Dr. Annie Paras if she can prescribe "pearls for her cough".   Exercise:  What type of activity are you doing? Cleaned and yardwork , walks, parks farther out  How often- how many days a week and how many minutes each time- 3x/week x 30.    Sleep: Not sleeping well because of pollen, coughing. Uses cpap What time do you wake up? 830-9 What time do you go to sleep? 12 midnight How often to wake up during that time? 2-3 times, uses bathroom  Labs/Anthropometrics:  Lab Results  Component Value Date   HGBA1C 9.7 (A) 08/21/2018   HGBA1C 9.9 (A) 06/19/2018   HGBA1C 8.5 (A) 02/06/2018   Wt Readings from Last 5 Encounters:  08/21/18 253 lb 9.6 oz (115 kg)  06/19/18 250 lb 3.2 oz (113.5 kg)  04/23/18 250 lb 12.8 oz (113.8 kg)  04/19/18 247 lb 8 oz (112.3 kg)  04/10/18 247 lb 3.2 oz (112.1 kg)   Plan:  Date of follow-up visit scheduled- May with Dr. Annie Paras, uploading her CGM weekly, considering adding carbs to there information she records Any other questions or concerns?" medicine questions above and she asks about what is Ozempic.her husband just started it. Educated her about what class of medicine Ozempic is and referred her to our pharmacist for more detail.   Debera Lat, RD 09/18/2018 3:58 PM.

## 2018-09-19 ENCOUNTER — Ambulatory Visit: Payer: PPO | Admitting: Dietician

## 2018-09-19 ENCOUNTER — Encounter: Payer: Self-pay | Admitting: Dietician

## 2018-09-19 NOTE — Progress Notes (Signed)
Spoke with Ms. Risby by phone yesterday in lieu of her appointment today. See telephone note dated 09/18/2018 for details. Butch Penny Janika Jedlicka, RD 09/19/2018 9:15 AM.

## 2018-09-19 NOTE — Patient Instructions (Addendum)
Clement Husbands job scanning the [place] at least 4 times a day and uploading every Friday.   Please try to enter number of carbs eaten into your Honor on a few days and see how that goes.   Congratulations for parking your car out farther to increase your activity. This type of increase makes a difference in the long term.   Be in touch! Orra [phone] .

## 2018-09-20 ENCOUNTER — Telehealth: Payer: Self-pay | Admitting: Internal Medicine

## 2018-09-20 ENCOUNTER — Other Ambulatory Visit: Payer: Self-pay | Admitting: Dietician

## 2018-09-20 DIAGNOSIS — E1140 Type 2 diabetes mellitus with diabetic neuropathy, unspecified: Secondary | ICD-10-CM

## 2018-09-20 DIAGNOSIS — Z794 Long term (current) use of insulin: Principal | ICD-10-CM

## 2018-09-20 MED ORDER — LIRAGLUTIDE -WEIGHT MANAGEMENT 18 MG/3ML ~~LOC~~ SOPN: 3.0000 mg | PEN_INJECTOR | Freq: Every day | SUBCUTANEOUS | 1 refills | Status: DC

## 2018-09-20 NOTE — Addendum Note (Signed)
Addended by: Forde Dandy on: 09/20/2018 03:54 PM   Modules accepted: Orders

## 2018-09-20 NOTE — Telephone Encounter (Signed)
Fast busy signal on contact # provided by patient. Unable to reach at this time. Will have to try again later.

## 2018-09-20 NOTE — Telephone Encounter (Signed)
Per [name] her Pharmacy does not have rx for liraglutide/saxenda. It was set to "no print" when Dr. Annie Paras signed the most recent prescription. Please resend the prescription for 15 ml or 5 pens liraglutide 18 mg/3 ML(Saxenda)

## 2018-09-20 NOTE — Telephone Encounter (Signed)
Thank you :)

## 2018-09-24 NOTE — Telephone Encounter (Signed)
I don't think that Dr. Melvyn Novas should be the one ordering any CPAP supplies. This patient had both sleep studies done and ordered by Dr. Oval Linsey. Dr. Annamaria Boots just ready the studies and Dr. Melvyn Novas has only seen her for Chronic Cough. I feel like who ever ordered the CPAP to begin with should be ordering the supplies unless Dr. Melvyn Novas chooses to. I have tried to call [facility] about this but the phone just rings fast busy

## 2018-09-24 NOTE — Telephone Encounter (Signed)
Contact # still giving a busy signal. Will try again later.

## 2018-09-25 ENCOUNTER — Telehealth: Payer: Self-pay | Admitting: *Deleted

## 2018-09-25 ENCOUNTER — Telehealth: Payer: Self-pay | Admitting: Dietician

## 2018-09-25 NOTE — Telephone Encounter (Signed)
[name] calls to tell us that her insurance called her and told her that [name] was denied. She plans to appeal.  Thinks Saxenda dose of liraglutide is helping. Asking about Tessalon pearls for cough. (she thinks is from allergies)

## 2018-09-25 NOTE — Telephone Encounter (Addendum)
Information was sent through CoverMyMeds for [place] for Saxenda.  Josph Macho can be reached at [phone].  Request is currently being processed.  Sander Nephew, RN 09/25/2018 10:22 AM.  Call to [facility] to ask about denal of the Saxenda.  Stated not covered under patient's plan as it is a  Benefit Exclusion.  Spoke with representative about denial as patient is also Diabetic.  Representative suggested that pharmacy use patient's Medicare Part B.  Call to [facility] who stated that they do not have patient's card and that patient will need to bring card in.  Call to patient  Informed her of this option for possible payment.  Will contact [employer] about Medicare Part B Card.  Sander Nephew, RN 09/27/2018 11:38 AM. Call from patient that Medicare Part B will not cover Saxenda.  Call to [facility] informed them of the denial.  Appeal information to be faxed to the Clinics for completion and submission. Sander Nephew, RN 09/28/2018 10:35 AM.

## 2018-09-26 ENCOUNTER — Encounter: Payer: Self-pay | Admitting: Internal Medicine

## 2018-09-26 NOTE — Telephone Encounter (Signed)
ATC, line busy-Anita- did you hear back about this by chance?

## 2018-09-26 NOTE — Telephone Encounter (Addendum)
I called [facility] and spoke with Roderic Palau and I told him the patient was calling here about CPAP supplies. Not sure why she called our office we didn't order the CPAP or supplies before. Dr. Oval Linsey ordered sleep studies and I assume he order the CPAP. The form they faxed over only had Dr. Melvyn Novas on the cover sheet and cover letter. The CMN it self had Dr. Orion Modest name on it

## 2018-09-28 ENCOUNTER — Other Ambulatory Visit: Payer: Self-pay | Admitting: *Deleted

## 2018-09-28 DIAGNOSIS — E1140 Type 2 diabetes mellitus with diabetic neuropathy, unspecified: Secondary | ICD-10-CM

## 2018-09-28 DIAGNOSIS — Z794 Long term (current) use of insulin: Principal | ICD-10-CM

## 2018-09-28 MED ORDER — INSULIN REGULAR HUMAN (CONC) 500 UNIT/ML ~~LOC~~ SOLN: SUBCUTANEOUS | 3 refills | Status: DC

## 2018-09-28 NOTE — Telephone Encounter (Signed)
Lizvette calls to tell us she uploaded her cgm and wanted me to review with her how to look at her averages and how to record her carb intake using her CGM . She is disappointed that her 2 week CGM report showed blood sugar average 207mg /dl. She is trying to eat 5-6 times a day. I encouraged her ot try only eating 3 times a day well balanced meals/snacks and spacing them 4-5 hours apart. CGM report shared with Dr. Annie Paras.

## 2018-09-28 NOTE — Telephone Encounter (Signed)
Print option selected on refill for insulin to fill Vgo. This was called to [place] at [place]. Hubbard Hartshorn, RN, BSN

## 2018-09-28 NOTE — Telephone Encounter (Signed)
Attempted to call pt x2 to let her know this information but received a busy signal and unable to leave VM. Will try again later.

## 2018-10-01 NOTE — Telephone Encounter (Signed)
Attempted to call pt x2 but each time received a busy signal and unable to leave a VM. Will try to call back later.

## 2018-10-02 ENCOUNTER — Other Ambulatory Visit: Payer: Self-pay | Admitting: Internal Medicine

## 2018-10-02 NOTE — Telephone Encounter (Signed)
Attempted to call pt but immediately received a busy signal and unable to leave a VM.

## 2018-10-03 NOTE — Telephone Encounter (Signed)
ATC x2, received a busy tone each time.

## 2018-10-04 NOTE — Telephone Encounter (Signed)
Due to multiple attempts trying to reach pt via phone and always receiving a busy signal, since pt has an active mychart account, I have sent her the information that we found out to her in a mychart message. Will close this encounter.

## 2018-10-05 ENCOUNTER — Telehealth: Payer: Self-pay | Admitting: Dietician

## 2018-10-05 NOTE — Telephone Encounter (Signed)
[name] uploaded her cgm; while reviewing results we noted she is not changing her vgo daily but sometimes every other day. This means her basal insulin is running out after 24 hours of wear and she is going without basal insulin for ~ 24 hours. She plans to change it every day in the am and to help herself make this a habit she is going to put reminders on her phone on her mirror and on her refrigerator.     She asks about appeal for saxenda. I told her I would ask our nurse who is handling that. The plan is to change to Ozempic if the saxenda appeal is denied. She reports a 2 pound weight loss  She also asked about getting a signature for her cpap supplies; asking who can sign the paperwork, she has been 2.5 months without new supplies now. I told her I would consult our front office staff who know best how this needs to be done.

## 2018-10-05 NOTE — Telephone Encounter (Signed)
Have not heard.  I would probably go ahead with the change to Ozempic.  Patient has the 2 insurances and in talking to the representatives there was no plan to try and override.

## 2018-10-08 ENCOUNTER — Other Ambulatory Visit: Payer: Self-pay | Admitting: Internal Medicine

## 2018-10-08 DIAGNOSIS — H1011 Acute atopic conjunctivitis, right eye: Secondary | ICD-10-CM | POA: Diagnosis not present

## 2018-10-08 DIAGNOSIS — H2511 Age-related nuclear cataract, right eye: Secondary | ICD-10-CM | POA: Diagnosis not present

## 2018-10-08 DIAGNOSIS — Z961 Presence of intraocular lens: Secondary | ICD-10-CM | POA: Diagnosis not present

## 2018-10-08 MED ORDER — SEMAGLUTIDE(0.25 OR 0.5MG/DOS) 2 MG/1.5ML ~~LOC~~ SOPN: 0.5000 mg | PEN_INJECTOR | SUBCUTANEOUS | 3 refills | Status: DC

## 2018-10-11 ENCOUNTER — Telehealth: Payer: Self-pay | Admitting: Dietician

## 2018-10-11 NOTE — Telephone Encounter (Signed)
Left a voicemail for Laquan to call to discuss transition from once daily victoza to once weekly Ozempic GLP-1. Also informed  Liyat to  that I would not be in the office tomorrow but she could still upload her CGM tomorrow and I would get it on Monday.

## 2018-10-12 DIAGNOSIS — H1011 Acute atopic conjunctivitis, right eye: Secondary | ICD-10-CM | POA: Diagnosis not present

## 2018-10-12 DIAGNOSIS — H2511 Age-related nuclear cataract, right eye: Secondary | ICD-10-CM | POA: Diagnosis not present

## 2018-10-12 DIAGNOSIS — Z961 Presence of intraocular lens: Secondary | ICD-10-CM | POA: Diagnosis not present

## 2018-10-17 ENCOUNTER — Other Ambulatory Visit: Payer: Self-pay | Admitting: Internal Medicine

## 2018-10-17 DIAGNOSIS — K219 Gastro-esophageal reflux disease without esophagitis: Secondary | ICD-10-CM

## 2018-10-18 ENCOUNTER — Telehealth: Payer: Self-pay | Admitting: Dietician

## 2018-10-18 ENCOUNTER — Other Ambulatory Visit: Payer: Self-pay | Admitting: Internal Medicine

## 2018-10-18 DIAGNOSIS — M545 Low back pain, unspecified: Secondary | ICD-10-CM

## 2018-10-18 DIAGNOSIS — G8929 Other chronic pain: Secondary | ICD-10-CM

## 2018-10-18 NOTE — Telephone Encounter (Signed)
Discussed stopping victoza and starting the ozempic. She verbalized understanding. We reviewed where she can wear her VGo40. She is changing it daily in the mornings now.  We briefly reviewed last weeks CGM upload that is showing decreasing blood sugars:    October 02, 2018 - Oct 15, 2018 14 Days % Time CGM is Active 79% Average Glucose 183 mg/dL Glucose Management Indicator (GMI) 7.7% Glucose Variability 27.4% Defined as percent coefficient of variation (%CV); target ?36% TIME IN RANGES High 181 - 250 mg/dL 45% (10h 63min) Target Range 70 - 180 mg/dL 44% (10h 62min) Low 54 - 69 mg/dL 2% (46min) Very High >250 mg/dL 9% (2h 47min) Very Low <54 mg/dL 0%   Butch Penny plans to upload tomorrow and we agreed to review her upload next week.  Debera Lat, RD 10/18/2018 5:30 PM.

## 2018-10-18 NOTE — Telephone Encounter (Signed)
Refill Request- for the medications listed below.  Patient also needs clarification on which Diabetes medication she should be taking.  irbesartan (AVAPRO) 150 MG tablet oxyCODONE-acetaminophen (PERCOCET/ROXICET) 5-325 MG tablet  [facility] [place], [place] - [address]

## 2018-10-19 DIAGNOSIS — H1045 Other chronic allergic conjunctivitis: Secondary | ICD-10-CM | POA: Diagnosis not present

## 2018-10-19 DIAGNOSIS — H0102A Squamous blepharitis right eye, upper and lower eyelids: Secondary | ICD-10-CM | POA: Diagnosis not present

## 2018-10-19 DIAGNOSIS — Z961 Presence of intraocular lens: Secondary | ICD-10-CM | POA: Diagnosis not present

## 2018-10-19 DIAGNOSIS — H04123 Dry eye syndrome of bilateral lacrimal glands: Secondary | ICD-10-CM | POA: Diagnosis not present

## 2018-10-19 DIAGNOSIS — H1011 Acute atopic conjunctivitis, right eye: Secondary | ICD-10-CM | POA: Diagnosis not present

## 2018-10-19 DIAGNOSIS — H0102B Squamous blepharitis left eye, upper and lower eyelids: Secondary | ICD-10-CM | POA: Diagnosis not present

## 2018-10-19 DIAGNOSIS — H2511 Age-related nuclear cataract, right eye: Secondary | ICD-10-CM | POA: Diagnosis not present

## 2018-10-21 ENCOUNTER — Other Ambulatory Visit: Payer: Self-pay | Admitting: Internal Medicine

## 2018-10-22 ENCOUNTER — Ambulatory Visit: Payer: PPO | Admitting: Internal Medicine

## 2018-10-24 ENCOUNTER — Telehealth: Payer: Self-pay | Admitting: Internal Medicine

## 2018-10-24 NOTE — Telephone Encounter (Signed)
Dr Annie Paras, I just reviewed the April oxy script, we did not pick up in review before that script never went to pharmacy, it was set to print. Called pharmacy and they confirmed that they never rec'd the script. Pt would like a refill, since it is not on her medlist now I can send a request

## 2018-10-24 NOTE — Telephone Encounter (Signed)
Needs refill on pain medicine [place], [place] - [address]   ;pt contact [phone]

## 2018-10-24 NOTE — Telephone Encounter (Signed)
Pt called and stated she went to [facility] and had COVID testing, it was negative, states she had the testing done 1 week ago, stated to call [phone] for fax results, called rings and rings, no answer, no vmail

## 2018-10-24 NOTE — Telephone Encounter (Signed)
Patient requesting a call back about obtaining her COVID-19 results to be placed medical records.

## 2018-10-25 ENCOUNTER — Other Ambulatory Visit: Payer: Self-pay | Admitting: Internal Medicine

## 2018-10-25 NOTE — Telephone Encounter (Signed)
Dr Annie Paras, please re enter the script for pain med, that way it can read exactly as you want it

## 2018-10-25 NOTE — Telephone Encounter (Signed)
Okay, yes please send a request. Thanks.

## 2018-10-26 ENCOUNTER — Telehealth: Payer: Self-pay | Admitting: Dietician

## 2018-10-26 NOTE — Telephone Encounter (Signed)
[name] uploaded her CGM. Results higher this week. Returned call to acknowledge receipt and assess transition to new GLP-1.

## 2018-10-28 ENCOUNTER — Other Ambulatory Visit: Payer: Self-pay | Admitting: Internal Medicine

## 2018-10-28 MED ORDER — OXYCODONE-ACETAMINOPHEN 5-325 MG PO TABS: 1.0000 | ORAL_TABLET | Freq: Three times a day (TID) | ORAL | 0 refills | Status: DC | PRN

## 2018-10-28 NOTE — Telephone Encounter (Signed)
Rx sent 

## 2018-10-29 ENCOUNTER — Other Ambulatory Visit: Payer: Self-pay | Admitting: Internal Medicine

## 2018-10-29 DIAGNOSIS — E1140 Type 2 diabetes mellitus with diabetic neuropathy, unspecified: Secondary | ICD-10-CM

## 2018-10-29 DIAGNOSIS — Z794 Long term (current) use of insulin: Secondary | ICD-10-CM

## 2018-10-29 NOTE — Telephone Encounter (Signed)
[name] calls concerned about her higher blood sugars since transitioning to [place]. Vision is blurrier . Started Ozempic last Tuesday, appetite the same, no nausea or vomiting or problems with bowels noted. She feels saxenda helped her sugars more. Since last week her blood sugar have been in the  high 200s.  286,  281,  270, 242 today Her concern is her averages-  7 days 270 14 days- 250 30 days- 216 90 days 208  She says she feels good other than the blurry vision,. Has been walking daily 30 minutes most days and trying to eat fruits, vegetables, less carbs. Just started new vial insulin,  Changing vgo- daily, fills them weekly Feels like her weight is decreasing- although her reported weights are similar to 2 months ago. wt 250.1- 254 #  Plan: Encouraged [name] to continue walking as mush as possible and healthy food choices. ask Dr. Annie Paras about increasing her dose of Ozempic?  [place], RD 10/29/2018 11:08 AM.

## 2018-10-29 NOTE — Telephone Encounter (Signed)
Needs refill oxyCODONE-acetaminophen (PERCOCET) 5-325 MG tablet [place], [place] - [address] ;pt contact [phone]  Pt has been waiting for pain medicine since early last week pls contact     Saraiyah Plyler pt would like you to call her pls [phone]

## 2018-10-31 ENCOUNTER — Encounter: Payer: Self-pay | Admitting: Internal Medicine

## 2018-10-31 ENCOUNTER — Ambulatory Visit (INDEPENDENT_AMBULATORY_CARE_PROVIDER_SITE_OTHER): Payer: PPO | Admitting: Internal Medicine

## 2018-10-31 ENCOUNTER — Other Ambulatory Visit: Payer: Self-pay

## 2018-10-31 ENCOUNTER — Telehealth: Payer: Self-pay | Admitting: *Deleted

## 2018-10-31 VITALS — BP 146/70 | HR 96 | Temp 98.6°F | Ht 64.0 in | Wt 256.0 lb

## 2018-10-31 DIAGNOSIS — R05 Cough: Secondary | ICD-10-CM

## 2018-10-31 DIAGNOSIS — Z9989 Dependence on other enabling machines and devices: Secondary | ICD-10-CM | POA: Diagnosis not present

## 2018-10-31 DIAGNOSIS — J449 Chronic obstructive pulmonary disease, unspecified: Secondary | ICD-10-CM | POA: Diagnosis not present

## 2018-10-31 DIAGNOSIS — R053 Chronic cough: Secondary | ICD-10-CM

## 2018-10-31 DIAGNOSIS — G4733 Obstructive sleep apnea (adult) (pediatric): Secondary | ICD-10-CM | POA: Diagnosis not present

## 2018-10-31 MED ORDER — MONTELUKAST SODIUM 10 MG PO TABS: 10.0000 mg | ORAL_TABLET | Freq: Every day | ORAL | 11 refills | Status: DC

## 2018-10-31 MED ORDER — BUDESONIDE-FORMOTEROL FUMARATE 80-4.5 MCG/ACT IN AERO: 2.0000 | INHALATION_SPRAY | Freq: Two times a day (BID) | RESPIRATORY_TRACT | 0 refills | Status: DC

## 2018-10-31 NOTE — Assessment & Plan Note (Signed)
Initial rx per Dr Eppie Gibson > referred to sleep medicine at [facility] at pt request 10/31/2018   Pt very prone to confusion as to what doctor is treating what problem so best bet:  Refer to sleep medicine here and let that doctor take care of cough/ sob as well   Until that happens though we can refill her cpap supplies here    I had an extended discussion with the patient reviewing all relevant studies completed to date and  lasting 25 minutes of a 40 minute visit:  See device teaching which extended face to face time for this visit    Each maintenance medication was reviewed in detail including most importantly the difference between maintenance and prns and under what circumstances the prns are to be triggered using an action plan format that is not reflected in the computer generated alphabetically organized AVS but trather by a customized med calendar that reflects the AVS meds with confirmed 100% correlation.   In addition, Please see AVS for unique instructions that I personally wrote and verbalized to the the pt in detail and then reviewed with pt  by my nurse highlighting any  changes in therapy recommended at today's visit to their plan of care.

## 2018-10-31 NOTE — Progress Notes (Signed)
Subjective:     Patient ID: [name], female   DOB: 1960/08/24,  MRN: [id number]    Brief patient profile:  [id number] quit smoking 07/1998 says born with asthma remembers freq er trips and allergy eval by Dr Bernita Buffy on shots >> reduced    need for "the pump" for breathing / some better after quit smoking then  referred to pulmonary clinic 11/16/2017 by Dr   Chase Caller for refractory cough since around 2015   History of Present Illness  11/16/2017 1st  office visit/ [name]   Chief Complaint  Patient presents with  . Pulmonary Consult    Second opinion on cough per Dr Chase Caller. She states she has been coughing for the past 2-3 years. She states she occ produces some clear sputum. She has noticed that the cough can be triggered by talking. It will occ wake her up in the night.   has been eval by S wright  Breathing is fine as long as not coughing   [name] Reflux v Neurogenic Cough Differentiator Reflux Comments  Do you awaken from a sound sleep coughing violently?                            With trouble breathing? 2 x per week no   Do you have choking episodes when you cannot  Get enough air, gasping for air ?              no   Do you usually cough when you lie down into  The bed, or when you just lie down to rest ?                          sometimes   Do you usually cough after meals or eating?         2-3 week   Do you cough when (or after) you bend over?    no   GERD SCORE     [name] Reflux v Neurogenic Cough Differentiator Neurogenic   Do you more-or-less cough all day long? sporadic   Does change of temperature make you cough? Yes hot   Does laughing or chuckling cause you to cough? yes   Do fumes (perfume, automobile fumes, burned  Toast, etc.,) cause you to cough ?      yes   Does speaking, singing, or talking on the phone cause you to cough   ?               Sometimes    Neurogenic/Airway score      rec Change gabapentin to 300 mg three times daily  For drainage / throat  tickle try take CHLORPHENIRAMINE  4 mg - take one every 4 hours as needed - available over the counter- may cause drowsiness so start with just a bedtime dose or two and see how you tolerate it before trying in daytime   Stop  nexium  And continue pantoprazole 40 mg Take 30- 60 min before your first and last meals of the day and Zantac (ranitidine) at bedtime  Change symbicort 80 to where you take it up to 2 puff every 12 hours only if you feel you really need it for breathing or if it helps your cough GERD diet      12/29/2017  f/u ov/[name] re: cough since 2015 / brought meds but totally confused concept of maint vs prns and did not  restart gabapentin / no worse off symbicort  Chief Complaint  Patient presents with  . Follow-up    Cough had improved some and then worsened again 2 wks ago- occ prod with clear sputum. She is using her albuterol inhaler 1 x per wk on average.   cough never completely resolved / still using lots of  mints Dyspnea:  Only when coughing or steps = MMRC1 = can walk nl pace, flat grade, can't hurry or go uphills or steps s sob   Cough is dry day > noct esp with use of voice/ laughter  Has not needed saba much at all since stopped symbicort  rec Start  gabapentin to 300 mg three times daily  For drainage / throat tickle continue CHLORPHENIRAMINE  4 mg - take one every 4 hours as needed - available over the counter- may cause drowsiness so   Continue bedtime dose  X  two  Then take it every 4 hours during the day just  as needed for throat tickle  Stop  nexium  And continue pantoprazole 40 mg Take 30- 60 min before your first and last meals of the day and Zantac (ranitidine) at bedtime  GERD  See [name] NP in 4 weeks with all your medications>  Medication calendar done but not following      03/26/2018  f/u ov/[name] re: chewing mint gum/ occ throat clearing / did not bring med calendar  Chief Complaint  Patient presents with  . Follow-up    Cough has resovled. She  rarely uses her albuterol inhaler.   Dyspnea:  Steps x one flight and stop at top/ back stops her first  if walking flat surface Cough: not at all at noct/ just using one h1 per day and 2 at hs  Sleeping: fine on cpap / p 2 chlorpheniramine 61m x 2   SABA use:  Very rare inhaler / has symbicort not using  Overt hb  despite ppi bid   rec Increase gabapentin to 300 mg four times a day as per you new med calendar today GERD diet  Please schedule a follow up office visit in 4 weeks, sooner if needed - bring your med calendar with you that is  dated today and keep up with it - enter any changes in meantime from other doctors if changes are made       10/31/2018  f/u ov/[name] re: uacs worse x one month / not following med calendar, did not bring meds  Chief Complaint  Patient presents with  . Follow-up    Increased cough for the past month- non prod    Dyspnea:  MMRC1 = can walk nl pace, flat grade, can't hurry or go uphills or steps s sob   Cough: cry but assoc with sensation pnds better when taking 1st gen H1 blockers per guidelines   Sleeping: cpap mask getting old "drying out nose due to leakage" > has not returned to dr who did original rx (Eppie Gibson and outpt clinic not responding to her calls (per pt)  SABA use: says ventolin out of date but records show refilled in feb 2020 with two more refills approved   02: none    No obvious day to day or daytime variability or assoc excess/ purulent sputum or mucus plugs or hemoptysis or cp or chest tightness, subjective wheeze or overt   hb symptoms.    . Also denies any obvious fluctuation of symptoms with weather or environmental changes or other aggravating  or alleviating factors except as outlined above   No unusual exposure hx or h/o childhood pna  or knowledge of premature birth.  Current Allergies, Complete Past Medical History, Past Surgical History, Family History, and Social History were reviewed in [employer]  record.  ROS  The following are not active complaints unless bolded Hoarseness, sore throat, dysphagia, dental problems, itching, sneezing,  nasal congestion or discharge of excess mucus or purulent secretions, ear ache,   fever, chills, sweats, unintended wt loss or wt gain, classically pleuritic or exertional cp,  orthopnea pnd or arm/hand swelling  or leg swelling, presyncope, palpitations, abdominal pain, anorexia, nausea, vomiting, diarrhea  or change in bowel habits or change in bladder habits, change in stools or change in urine, dysuria, hematuria,  rash, arthralgias, visual complaints, headache, numbness, weakness or ataxia or problems with walking or coordination,  change in mood or  memory.        Current Meds  Medication Sig  . acyclovir (ZOVIRAX) 400 MG tablet Take 1 tablet (400 mg total) by mouth 3 (three) times daily as needed (flares).  Marland Kitchen albuterol (PROVENTIL HFA;VENTOLIN HFA) 108 (90 Base) MCG/ACT inhaler Inhale 2 puffs into the lungs every 4 (four) hours as needed for wheezing or shortness of breath.  Marland Kitchen aspirin 81 MG EC tablet Take 81 mg by mouth daily.    . Blood Glucose Monitoring Suppl (ONETOUCH VERIO FLEX SYSTEM) w/Device KIT 1 each by Does not apply route 3 (three) times daily.  . chlorpheniramine (CHLOR-TRIMETON) 4 MG tablet Take 4 mg by mouth every 4 (four) hours as needed for allergies.  . Continuous Blood Gluc Receiver (FREESTYLE LIBRE 14 DAY READER) DEVI 1 each by Does not apply route 4 (four) times daily.  . Continuous Blood Gluc Sensor (FREESTYLE LIBRE 14 DAY SENSOR) MISC 1 each by Does not apply route 4 (four) times daily.  . Continuous Blood Gluc Sensor (FREESTYLE LIBRE 14 DAY SENSOR) MISC CHECK BLOOD SUGARS FOUR TIMES DAILY.  Marland Kitchen Continuous Glucose Monitor DEVI Use as directed.  . gabapentin (NEURONTIN) 300 MG capsule Take 1 capsule by mouth 4 times daily  . hydrochlorothiazide (HYDRODIURIL) 25 MG tablet Take 1 tablet (25 mg total) by mouth daily.  . Insulin  Disposable Pump (V-GO 40) KIT 1 kit by Does not apply route daily.  . Insulin Pen Needle (B-D UF III MINI PEN NEEDLES) 31G X 5 MM MISC Use to inject insulin twice a day  . insulin regular human CONCENTRATED (HUMULIN R) 500 UNIT/ML injection Use to fill Vgo daily.  Use 3-4 clicks with breakfast, 3-4 clicks with lunch, and 4-5 clicks with dinner.  Use 2 clicks with snacks twice daily.  . Insulin Syringe-Needle U-100 (INSULIN SYRINGE 1CC/30GX1/2") 30G X 1/2" 1 ML MISC Use to fill Vgo daily  . irbesartan (AVAPRO) 150 MG tablet Take 1 tablet (150 mg total) by mouth daily.  . metFORMIN (GLUCOPHAGE) 1000 MG tablet TAKE 1 TABLET BY MOUTH TWICE DAILY WITH A MEAL  . Multiple Vitamin (MULTIVITAMIN WITH MINERALS) TABS tablet Take 1 tablet by mouth daily.  Glory Rosebush DELICA LANCETS FINE MISC Check blood sugar 3 times a day  . ONETOUCH VERIO test strip  CHECK BLOOD SUGAR 3 TIMES A DAY  . oxyCODONE-acetaminophen (PERCOCET) 5-325 MG tablet Take 1 tablet by mouth every 8 (eight) hours as needed for severe pain.  . pantoprazole (PROTONIX) 40 MG tablet Take 1 tablet by mouth twice daily  . Semaglutide,0.25 or 0.5MG/DOS, (OZEMPIC, 0.25 OR 0.5 MG/DOSE,)  2 MG/1.5ML SOPN Inject 0.5 mg into the skin once a week.                    Objective:   Physical Exam    Obese bf nad   10/31/2018       256  03/26/2018     251   12/29/2017      250   11/16/17 256 lb (116.1 kg)  11/14/17 260 lb 11.2 oz (118.3 kg)  10/23/17 259 lb (117.5 kg)     Vital signs reviewed - Note on arrival 02 sats  98% on RA    HEENT: nl dentition, turbinates bilaterally, and oropharynx. Nl external ear canals without cough reflex   NECK :  without JVD/Nodes/TM/ nl carotid upstrokes bilaterally   LUNGS: no acc muscle use,  Nl contour chest which is clear to A and P bilaterally without cough on insp or exp maneuvers   CV:  RRR  no s3 or murmur or increase in P2, and no edema   ABD: obese  soft and nontender with nl inspiratory  excursion in the supine position. No bruits or organomegaly appreciated, bowel sounds nl  MS:  Nl gait/ ext warm without deformities, calf tenderness, cyanosis or clubbing No obvious joint restrictions   SKIN: warm and dry without lesions    NEURO:  alert, approp, nl sensorium with  no motor or cerebellar deficits apparent.           Assessment:

## 2018-10-31 NOTE — Assessment & Plan Note (Signed)
Onset around 2015 CT sinus 02/23/17 neg for sinus dz - FENO 11/16/2017  =  12 - Spirometry 11/16/2017  FEV1 1.33 (62%)  Ratio 69 p am symb 80 x 2 > changed to prn 11/16/17  - 11/16/2017 trial of gabapentin 300 tid / max gerd rx/ 1st gen H1 blockers per guidelines   > did not follow instructions > repeat same rx 12/29/2017  - Allergy profile 03/26/2018 >  Eos 0.2 /  IgE 27 RAST pos ragweed, grass, tree   - 03/26/2018 increased gabapentin to 300 mg qid> improved 04/23/2018 so no change rx - 10/31/2018 flared spring time rec add singulair and increase prn 1st gen H1 blockers per guidelines    The real challenge here is  that until we are certain that the  patients are doing what we've asked, it makes no sense to ask them to do more and again today very difficult time with med reconciliation so rec KIS principle:  Add singulair to med calendar  Review prn portion of med calendar re [place] of 1st gen H1 blockers per guidelines  And prn symbicort (see separate a/p)

## 2018-10-31 NOTE — Assessment & Plan Note (Signed)
Quit smoking 07/1998 PFTs  May 24, 2013  that showed moderate airflow obstruction with FEV1 is 1.35  59%. Ratio 62. No sign change BD  - Spirometry 11/16/2017  FEV1 1.33 (62%)  Ratio 69 p am symbicort  - 04/23/2018 reported doing fine so d/c maint inhalers - 10/31/2018  After extensive coaching inhaler device,  effectiveness =    75 % from 25% baseline > continue symb 80 up to 2 pffs q 12 h prn   Not really clear she has AB component but since still sensing need for saba can rx this as mild asthma with prn symb 80  Based on two studies from NEJM  378; 20 p 1865 (2018) and 380 : p2020-30 (2019) in pts with mild asthma it is reasonable to use low dose symbicort eg 80 2bid "prn" flare in this setting but I emphasized this was only shown with symbicort and takes advantage of the rapid onset of action but is not the same as "rescue therapy" but can be stopped once the acute symptoms have resolved and the need for rescue has been minimized (< 2 x weekly)

## 2018-10-31 NOTE — Assessment & Plan Note (Signed)
Body mass index is 43.94 kg/m.  -  trending up  Lab Results  Component Value Date   TSH 1.36 07/27/2016     Contributing to gerd risk/ doe/reviewed the need and the process to achieve and maintain neg calorie balance > defer f/u primary care including intermittently monitoring thyroid status

## 2018-10-31 NOTE — Patient Instructions (Addendum)
Add singulair 10 mg one daily in evening   For cough/wheeze/ short of breath > symbicort 80 up to 2 pffs every 12 hours  Work on inhaler technique:  relax and gently blow all the way out then take a nice smooth deep breath back in, triggering the inhaler at same time you start breathing in.  Hold for up to 5 seconds if you can. Blow out thru nose. Rinse and gargle with water when done     For drainage / throat tickle try take CHLORPHENIRAMINE  4 mg  (Chlortab 4mg   at [employer] should be easiest to find in the green box)  take one-two  every 4 hours as needed - available over the counter- may cause drowsiness so start with just a bedtime dose or two and see how you tolerate it before trying in daytime    We will refer you to our sleep medicine dept and renew your equipment in the meantime    Please schedule a follow up visit in 3 months but call sooner if needed  with all medications /inhalers/ solutions in hand so we can verify exactly what you are taking. This includes all medications from all doctors and over the counters- bring med calendar with you

## 2018-10-31 NOTE — Telephone Encounter (Addendum)
Received message from front office that patient had called earlier this morning regarding cpap supplies.  Call was made to [facility] @ [phone]-states they received faxed order on 10/29/18 and order will be processed accordingly.  Attempted to contact patient and let her know-no answer, but CMA left contact info on recorder.[name]5/20/202011:54 AM  Prior to closing encounter, CMA reviewed telehealth visit with [facility] from earlier today.  Per visit notes,  pt will see sleep medicine there.  They are also helping patient to obtain cpap supplies as well.  Will follow up with patient and confirm.[name], Bibiana Gillean Cassady5/20/202012:33 PM

## 2018-11-06 MED ORDER — "INSULIN SYRINGE 30G X 1/2"" 1 ML MISC": 5 refills | Status: DC

## 2018-11-06 NOTE — Telephone Encounter (Signed)
Biviana calls asking if she can increase the Ozempic dose.   She reports that her blood sugars are still higher than goal, 255 (today after a banana), Last 7 days average 278, last 14 days average 260.   We reviewed vgo fill procedure over the phone. She was using syringes that are possibly too short to fill the VGo manually. Requested refill for the 1/2" long syringes. She verbalized understanding to check blood sugars frequently after restarting use fo the longer syringes.

## 2018-11-07 ENCOUNTER — Other Ambulatory Visit: Payer: Self-pay | Admitting: Internal Medicine

## 2018-11-07 MED ORDER — SEMAGLUTIDE(0.25 OR 0.5MG/DOS) 2 MG/1.5ML ~~LOC~~ SOPN: 1.0000 mg | PEN_INJECTOR | SUBCUTANEOUS | 3 refills | Status: DC

## 2018-11-07 NOTE — Telephone Encounter (Signed)
Yes. She can increase from 0.5mg  weekly to 1mg  weekly.

## 2018-11-07 NOTE — Telephone Encounter (Addendum)
Patient called and notified. She plans to start the new dose next week on Tuesday morning/  She is also asked about medicine interactions as Dr. Melvyn Novas just added a new medicine.  I told her I can send a note to our pharmacist for review. She seemed content with this.

## 2018-11-13 ENCOUNTER — Ambulatory Visit (INDEPENDENT_AMBULATORY_CARE_PROVIDER_SITE_OTHER): Payer: PPO | Admitting: Internal Medicine

## 2018-11-13 ENCOUNTER — Other Ambulatory Visit: Payer: Self-pay | Admitting: Dietician

## 2018-11-13 ENCOUNTER — Encounter: Payer: Self-pay | Admitting: Internal Medicine

## 2018-11-13 ENCOUNTER — Other Ambulatory Visit: Payer: Self-pay

## 2018-11-13 DIAGNOSIS — E1140 Type 2 diabetes mellitus with diabetic neuropathy, unspecified: Secondary | ICD-10-CM

## 2018-11-13 DIAGNOSIS — G8929 Other chronic pain: Secondary | ICD-10-CM

## 2018-11-13 DIAGNOSIS — Z79899 Other long term (current) drug therapy: Secondary | ICD-10-CM

## 2018-11-13 DIAGNOSIS — I10 Essential (primary) hypertension: Secondary | ICD-10-CM

## 2018-11-13 DIAGNOSIS — Z794 Long term (current) use of insulin: Secondary | ICD-10-CM

## 2018-11-13 DIAGNOSIS — M545 Low back pain, unspecified: Secondary | ICD-10-CM

## 2018-11-13 DIAGNOSIS — Z9641 Presence of insulin pump (external) (internal): Secondary | ICD-10-CM

## 2018-11-13 DIAGNOSIS — Z79891 Long term (current) use of opiate analgesic: Secondary | ICD-10-CM | POA: Diagnosis not present

## 2018-11-13 DIAGNOSIS — M549 Dorsalgia, unspecified: Secondary | ICD-10-CM | POA: Diagnosis not present

## 2018-11-13 DIAGNOSIS — E11319 Type 2 diabetes mellitus with unspecified diabetic retinopathy without macular edema: Secondary | ICD-10-CM | POA: Diagnosis not present

## 2018-11-13 MED ORDER — OXYCODONE-ACETAMINOPHEN 5-325 MG PO TABS: 1.0000 | ORAL_TABLET | Freq: Three times a day (TID) | ORAL | 0 refills | Status: DC | PRN

## 2018-11-13 MED ORDER — IRBESARTAN 150 MG PO TABS: 300.0000 mg | ORAL_TABLET | Freq: Every day | ORAL | 3 refills | Status: DC

## 2018-11-13 MED ORDER — SEMAGLUTIDE(0.25 OR 0.5MG/DOS) 2 MG/1.5ML ~~LOC~~ SOPN: 1.0000 mg | PEN_INJECTOR | SUBCUTANEOUS | 3 refills | Status: DC

## 2018-11-13 NOTE — Progress Notes (Addendum)
   This is a telephone encounter between [name] and [name] on 11/13/2018 for healthcare maintenence. The visit was conducted with the patient located at home and [name]. The patient's identity was confirmed using their DOB and current address. The patient has consented to being evaluated through a telephone encounter and understands the associated risks/benefits. I personally spent 25 minutes on medical discussion.   HPI:   [name] is a 58 y.o. female with the medical conditions listed below. I conducted a televisit with her for healthcare maintenance and management of her HTN and DMII. Please see problem based charting for the history and status of the patient's current and chronic medical conditions.   Past Medical History:  Diagnosis Date  . Allergy   . Arthritis    back   . Asthma    AS CHILD  . Chronic back pain   . Chronic leg pain    due to back pain  . Cocaine abuse ([place])    in remission  . 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 GERD (gastroesophageal reflux disease)   . Hyperlipidemia   . Hypertension   . Neuromuscular disorder ([facility])    neuropathy  . RECTAL BLEEDING 12/09/2008   Annotation: s/p EGD 7/08 mild gastritis, s/p colonoscopy 7/08- benign polyp  s/p polypectomy and isolated diverticulum.  Qualifier: Diagnosis of  By: Ditzler RN, Debra    . Sleep apnea    CPAP    YEARS AGO DONE 1/2 YEARS AGO AND WAS TOLD DID NOT HAVE  . Tobacco abuse   . Uterine fibroid    s/p hysterectomy    Review of Systems:   Pertinent positives mentioned in HPI. Remainder of all ROS negative.   Assessment & Plan:   Patient discussed with Dr. Daryll Drown

## 2018-11-13 NOTE — Assessment & Plan Note (Signed)
Last A1c was 9.7 two months ago. [name] is frustrated with her glycemic control and believes that her other uncontrolled comorbidities like OSA and back pain are contributing to her hyperglycemia. She has been doing Vgo (3 clicks TID with meals and 2 clicks TID with snacks), Ozempic, and metformin. She read me her CGM averages: 207 for the past 7 days, 235 for the past 14 days, 241 for the past 30 days, and 215 for the past 90 days. She states she is doing well with her low carb diet. She also recently began walking for exercise. Her weight is stable.   She continues to have poor glycemic control. She is resistant to increasing her clicks per day because her mealtimes are very irregular. Will increase her Ozempic today. She will need to be re-evaluated by optho to ensure her retinopathy does not worsen with Ozempic. She has an appointment with optho scheduled for 7/8.   Plan - Continue Vgo40 - Continue metformin - Increase Ozempic from 0.5mg  weekly to 1mg  weekly - Return to clinic in 1-2 months for repeat A1c

## 2018-11-13 NOTE — Assessment & Plan Note (Addendum)
Patient reports good compliance with hctz 25mg  and irbesartan 150mg  daily. Her BP at her last visit in March was 168/74 and her BP at her 5/20 visit with Dr. Melvyn Novas was 146/70. Denies headaches, vision changes, chest pain, dyspnea, leg swelling.   Plan - Continue hctz 25mg   - Increase irbesartan from 150mg  daily to 300mg  daily - Check BP and obtain BMP at next office visit in 1-2 months

## 2018-11-13 NOTE — Telephone Encounter (Signed)
[name] called while at pharmacy; they still do not have the updated Ozempic prescription. It was set to "no print" so did not go to the pharmacy. Re-request prescription.

## 2018-11-13 NOTE — Assessment & Plan Note (Signed)
Since my last visit with the patient on 3/11, she refilled 25 tablets of percocet on 3/20 and 5/18. Normally, she takes about 25 pills per month, but there was a prescription error and she was unable to pick up a refill until May. She ran out of percocet pills in mid April. She reports that she takes 0-2 percocet per day for back pain. She believes her back pain is worsening, especially since she has begun to walk for exercise. She would like to see a pain management specialist.   Plan - Refilled 25 tablets of Percocet 5-325 to be filled on 6/18.  - Referral made to pain clinic

## 2018-11-14 NOTE — Telephone Encounter (Signed)
Message from front office/clinical staff that pt called to state that she has not received her cpap supplies.  Prior to returning pt's call, CMA made call to [facility] and was informed that a new CMN order will need to be.  The original order had DrKlima's name, that was then signed by Dr Beryle Beams. They also received a CMN order signed by Dr Annie Paras, but system showed problem with DrDorrell's PECOS #.  [facility] will fax over new CMN order today .Crystal Hidden Cassady6/3/202012:21 PM

## 2018-11-14 NOTE — Progress Notes (Signed)
Internal Medicine Clinic Attending  Case discussed with Dr. Annie Paras soon after the resident saw the patient.  We reviewed the resident's history, telephone conversation and pertinent patient test results.  I agree with the assessment, diagnosis, and plan of care documented in the resident's note.

## 2018-11-14 NOTE — Telephone Encounter (Signed)
New CMN order signed by attending physician (DrButcher) and faxed back to [facility].[name], [name]6/3/20204:20 PM

## 2018-11-15 NOTE — Telephone Encounter (Signed)
Spoke with patient and made her aware of the delay.Marland KitchenMarland KitchenDespina Hidden Cassady6/4/20202:02 PM

## 2018-11-20 DIAGNOSIS — G4733 Obstructive sleep apnea (adult) (pediatric): Secondary | ICD-10-CM | POA: Diagnosis not present

## 2018-11-21 ENCOUNTER — Telehealth: Payer: Self-pay | Admitting: Dietician

## 2018-11-21 NOTE — Telephone Encounter (Addendum)
[name] is having problems with Ozempic pen. She cannot see any numbers on it. We reveiwed it's use and she still said she saw no numbers. gave her the company phone number to call for further assistance.   called to follow up- patient was able to resolve her difficulty after calling the company support line.

## 2018-11-26 ENCOUNTER — Encounter: Payer: Self-pay | Admitting: Internal Medicine

## 2018-11-26 ENCOUNTER — Ambulatory Visit (INDEPENDENT_AMBULATORY_CARE_PROVIDER_SITE_OTHER): Payer: PPO | Admitting: Internal Medicine

## 2018-11-26 ENCOUNTER — Other Ambulatory Visit: Payer: Self-pay

## 2018-11-26 VITALS — BP 124/80 | HR 88 | Temp 98.7°F | Ht 63.0 in | Wt 255.8 lb

## 2018-11-26 DIAGNOSIS — Z9989 Dependence on other enabling machines and devices: Secondary | ICD-10-CM

## 2018-11-26 DIAGNOSIS — J449 Chronic obstructive pulmonary disease, unspecified: Secondary | ICD-10-CM | POA: Diagnosis not present

## 2018-11-26 DIAGNOSIS — G4733 Obstructive sleep apnea (adult) (pediatric): Secondary | ICD-10-CM | POA: Diagnosis not present

## 2018-11-26 NOTE — Assessment & Plan Note (Signed)
She benefits from CPAP pressure is appropriate.  Meeting compliance and control guidelines based on download.  Establishing here and needs replacement supplies with attention to the mask fit. Plan-continue CPAP auto 10-20, mask of choice, humidifier, supplies, Airview/card

## 2018-11-26 NOTE — Patient Instructions (Signed)
Order- DME APS/ [facility]   Please refit and replace mask of choice, supplies, Continue CPAP auto 10-20, humidifier, AirView/ card  Please call us as needed

## 2018-11-26 NOTE — Assessment & Plan Note (Signed)
She refers to her breathing problem as "asthma".  Continues to follow with Dr. Melvyn Novas.

## 2018-11-26 NOTE — Progress Notes (Signed)
11/26/2018- 71 yoF former smoker for sleep evaluation. OSA on CPAP; DME: [facility], pt states mask leaks, recently got new machine 3-4 mo ago Medical problem list includes HBP, COPD GOLD II, Hepatic steatosis, GERD, DM2, Lung nodule< 6 cm, C/O snoring and stop breathing.  Epworth score 11 Body weight today 255 lbs NPSG 08/11/17- AHI 17.7/ hr, desaturation to 85%, 255 lbs CPAP auto 10-20/ [facility] Download- compliance 73%, AHI 0.6/ hr Mask is old and no longer sealing well.  Otherwise she has done well with CPAP and sleeps better. ENT surgery tonsils, denies heart problems.  Prior to Admission medications   Medication Sig Start Date End Date Taking? Authorizing Provider  acyclovir (ZOVIRAX) 400 MG tablet Take 1 tablet (400 mg total) by mouth 3 (three) times daily as needed (flares). 08/07/16  Yes Robyn Haber, MD  aspirin 81 MG EC tablet Take 81 mg by mouth daily.     Yes [provider]  Blood Glucose Monitoring Suppl ([place]) w/Device KIT 1 each by Does not apply route 3 (three) times daily. 01/12/17  Yes Shela Leff, MD  budesonide-formoterol (SYMBICORT) 80-4.5 MCG/ACT inhaler Inhale 2 puffs into the lungs 2 (two) times daily. Patient taking differently: Inhale 2 puffs into the lungs 2 (two) times daily. Pt states taking PRN 10/31/18  Yes Tanda Rockers, MD  chlorpheniramine (CHLOR-TRIMETON) 4 MG tablet Take 4 mg by mouth every 4 (four) hours as needed for allergies.   Yes [provider]  Continuous Blood Gluc Sensor (FREESTYLE LIBRE 14 DAY SENSOR) MISC 1 each by Does not apply route 4 (four) times daily. 05/02/17  Yes Shela Leff, MD  Continuous Glucose Monitor DEVI Use as directed. 03/28/17  Yes Shela Leff, MD  gabapentin (NEURONTIN) 300 MG capsule Take 1 capsule by mouth 4 times daily 08/30/18  Yes Tanda Rockers, MD  hydrochlorothiazide (HYDRODIURIL) 25 MG tablet Take 1 tablet (25 mg total) by mouth daily. 06/19/18  Yes Dorrell, Andree Elk, MD   Insulin Disposable Pump (V-GO 40) KIT 1 kit by Does not apply route daily. 09/07/18  Yes Dorrell, Andree Elk, MD  Insulin Pen Needle (B-D UF III MINI PEN NEEDLES) 31G X 5 MM MISC Use to inject insulin twice a day 05/18/18  Yes Bartholomew Crews, MD  insulin regular human CONCENTRATED (HUMULIN R) 500 UNIT/ML injection Use to fill Vgo daily.  Use 3-4 clicks with breakfast, 3-4 clicks with lunch, and 4-5 clicks with dinner.  Use 2 clicks with snacks twice daily. 09/28/18  Yes Annia Belt, MD  Insulin Syringe-Needle U-100 (INSULIN SYRINGE 1CC/30GX1/2") 30G X 1/2" 1 ML MISC Use to fill Vgo daily 11/06/18  Yes Dorrell, Andree Elk, MD  irbesartan (AVAPRO) 150 MG tablet Take 2 tablets (300 mg total) by mouth daily. 11/13/18  Yes Dorrell, Andree Elk, MD  metFORMIN (GLUCOPHAGE) 1000 MG tablet TAKE 1 TABLET BY MOUTH TWICE DAILY WITH A MEAL 10/17/18  Yes Dorrell, Andree Elk, MD  montelukast (SINGULAIR) 10 MG tablet Take 1 tablet (10 mg total) by mouth at bedtime. 10/31/18  Yes Tanda Rockers, MD  Multiple Vitamin (MULTIVITAMIN WITH MINERALS) TABS tablet Take 1 tablet by mouth daily.   Yes [provider]  [facility] DELICA LANCETS FINE MISC Check blood sugar 3 times a day 06/22/16  Yes Shela Leff, MD  [facility] VERIO test strip  CHECK BLOOD SUGAR 3 TIMES A DAY 05/09/18  Yes Dorrell, Andree Elk, MD  oxyCODONE-acetaminophen (PERCOCET) 5-325 MG tablet Take 1 tablet by mouth  every 8 (eight) hours as needed for severe pain. 11/29/18  Yes Dorrell, Andree Elk, MD  pantoprazole (PROTONIX) 40 MG tablet Take 1 tablet by mouth twice daily 10/17/18  Yes Dorrell, Andree Elk, MD  Semaglutide,0.25 or 0.5MG/DOS, (OZEMPIC, 0.25 OR 0.5 MG/DOSE,) 2 MG/1.5ML SOPN Inject 1 mg into the skin once a week. 11/13/18  Yes Dorrell, Andree Elk, MD  rosuvastatin (CRESTOR) 20 MG tablet Take 1 tablet (20 mg total) by mouth at bedtime. 08/15/17 03/26/18  Shela Leff, MD   Past Medical History:  Diagnosis Date  . Allergy   .  Arthritis    back   . Asthma    AS CHILD  . Chronic back pain   . Chronic leg pain    due to back pain  . Cocaine abuse ([place])    in remission  . Diabetes ([place])    with neuropathy  . GERD (gastroesophageal reflux disease)   . Hyperlipidemia   . Hypertension   . Neuromuscular disorder ([facility])    neuropathy  . RECTAL BLEEDING 12/09/2008   Annotation: s/p EGD 7/08 mild gastritis, s/p colonoscopy 7/08- benign polyp  s/p polypectomy and isolated diverticulum.  Qualifier: Diagnosis of  By: Ditzler RN, Debra    . Sleep apnea    CPAP    YEARS AGO DONE 1/2 YEARS AGO AND WAS TOLD DID NOT HAVE  . Tobacco abuse   . Uterine fibroid    s/p hysterectomy   Past Surgical History:  Procedure Laterality Date  . BACK SURGERY  2012   L5-S1 microendoscopic disectomy last surgery 06/2011  . BREAST BIOPSY     LEFT    01/19/16  . BREAST REDUCTION SURGERY  1982  . COLONOSCOPY    . HAND SURGERY     MIDDLE TRIGGER FINGER RIGHT SIDE  . RADIOACTIVE SEED GUIDED EXCISIONAL BREAST BIOPSY Left 02/18/2016   Procedure: LEFT RADIOACTIVE SEED GUIDED EXCISIONAL BREAST BIOPSY;  Surgeon: Alphonsa Overall, MD;  Location: [facility];  Service: General;  Laterality: Left;  . TONSILLECTOMY  2008  . TOTAL ABDOMINAL HYSTERECTOMY  05/24/2007   hysterectomy  . TUBAL LIGATION    . UPPER GASTROINTESTINAL ENDOSCOPY     Family History  Problem Relation Age of Onset  . Diabetes Father   . Heart disease Father   . Hypertension Father   . Diabetes Mother   . Cancer Mother        brain  . Hypertension Mother   . Kidney disease Sister   . Diabetes Sister   . Kidney cancer Sister   . Colon cancer Neg Hx   . Colon polyps Neg Hx   . Esophageal cancer Neg Hx   . Rectal cancer Neg Hx   . Stomach cancer Neg Hx    Social History   Socioeconomic History  . Marital status: Married    Spouse name: Not on file  . Number of children: 3  . Years of education: Not on file  . Highest education level: Not on file  Occupational History  .  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    Employer: [employer]  Social Needs  . Financial resource strain: Not on file  . Food insecurity    Worry: Not on file    Inability: Not on file  . Transportation needs    Medical: Not on file    Non-medical: Not on file  Tobacco Use  . Smoking status: Former Smoker    Packs/day: 0.50    Years: 20.00  Pack years: 10.00    Types: Cigarettes    Quit date: 07/23/1996    Years since quitting: 22.3  . Smokeless tobacco: Never Used  Substance and Sexual Activity  . Alcohol use: No    Alcohol/week: 0.0 standard drinks    Comment: recovering addict clean for 9 years  . Drug use: No    Types: Cocaine, Marijuana    Comment: recovering addict clean for 9 years  . Sexual activity: Not on file  Lifestyle  . Physical activity    Days per week: Not on file    Minutes per session: Not on file  . Stress: Not on file  Relationships  . Social Herbalist on phone: Not on file    Gets together: Not on file    Attends religious service: Not on file    Active member of club or organization: Not on file    Attends meetings of clubs or organizations: Not on file    Relationship status: Not on file  . Intimate partner violence    Fear of current or ex partner: Not on file    Emotionally abused: Not on file    Physically abused: Not on file    Forced sexual activity: Not on file  Other Topics Concern  . Not on file  Social History Narrative   Unemployed but has started taking community college classes (interested in accounting) since having back surgery, which has improved pain and mobility. Hopes to return to work soon. Lives with husband and daughter.  Uninsured.   ROS-see HPI   + = positive Constitutional:    weight loss, night sweats, fevers, chills, fatigue, lassitude. HEENT:    headaches, difficulty swallowing, tooth/dental problems, sore throat,       sneezing, itching, ear ache, +nasal congestion, post nasal drip,  snoring CV:    chest pain, orthopnea, PND, +swelling in lower extremities, anasarca,                                  dizziness, palpitations Resp:  + shortness of breath with exertion or at rest.                productive cough,  + non-productive cough, coughing up of blood.              change in color of mucus.  wheezing.   Skin:    rash or lesions. GI:  + heartburn, indigestion, +abdominal pain, nausea, vomiting, diarrhea,                 change in bowel habits, loss of appetite GU: dysuria, change in color of urine, no urgency or frequency.   flank pain. MS:   +joint pain, stiffness, decreased range of motion, back pain. Neuro-     nothing unusual Psych:  change in mood or affect.  depression or anxiety.   memory loss.  OBJ- Physical Exam General- Alert, Oriented, Affect-appropriate, Distress- none acute, + obese Skin- rash-none, lesions- none, excoriation- none Lymphadenopathy- none Head- atraumatic            Eyes- Gross vision intact, PERRLA, conjunctivae and secretions clear            Ears- Hearing, canals-normal            Nose- Clear, no-Septal dev, mucus, polyps, erosion, perforation             Throat-  Mallampati IV , mucosa clear , drainage- none, tonsils- atrophic Neck- flexible , trachea midline, no stridor , thyroid nl, carotid no bruit Chest - symmetrical excursion , unlabored           Heart/CV- RRR , no murmur , no gallop  , no rub, nl s1 s2                           - JVD- none , edema- none, stasis changes- none, varices- none           Lung- clear to P&A, wheeze- none, cough- none , dullness-none, rub- none           Chest wall-  Abd-  Br/ Gen/ Rectal- Not done, not indicated Extrem- cyanosis- none, clubbing, none, atrophy- none, strength- nl Neuro- grossly intact to observation

## 2018-11-27 ENCOUNTER — Encounter: Payer: Self-pay | Admitting: Podiatry

## 2018-11-27 ENCOUNTER — Other Ambulatory Visit: Payer: Self-pay | Admitting: Podiatry

## 2018-11-27 ENCOUNTER — Ambulatory Visit (INDEPENDENT_AMBULATORY_CARE_PROVIDER_SITE_OTHER): Payer: PPO

## 2018-11-27 ENCOUNTER — Ambulatory Visit: Payer: PPO | Admitting: Podiatry

## 2018-11-27 VITALS — Temp 97.2°F

## 2018-11-27 DIAGNOSIS — S99191A Other physeal fracture of right metatarsal, initial encounter for closed fracture: Secondary | ICD-10-CM

## 2018-11-27 DIAGNOSIS — S9001XA Contusion of right ankle, initial encounter: Secondary | ICD-10-CM

## 2018-11-27 DIAGNOSIS — S99921A Unspecified injury of right foot, initial encounter: Secondary | ICD-10-CM

## 2018-11-27 NOTE — Progress Notes (Signed)
Subjective: 58 year old female presents the office today for an acute appointment.  She states on Sunday she fell twisting her foot and she heard a pop and she had sudden pain to her foot.  She states that she slid across the floor.  Besides her foot she denies any other injury.  She is had swelling to the outside aspect of her foot and ankle.  Denies any systemic complaints such as fevers, chills, nausea, vomiting. No acute changes since last appointment, and no other complaints at this time.   Objective: AAO x3, NAD DP/PT pulses palpable bilaterally, CRT less than 3 seconds There is tenderness palpation to the on the fifth metatarsal base.  There is also mild discomfort of the anterior lateral aspect of the ankle joint.  Minimal discomfort of the distal fibula.  No other areas of tenderness identified at this time.  There is mild edema to the lateral aspect of the foot.  There is no erythema or warmth.  Peroneal tendon appears to be intact.  Achilles tendon intact. No open lesions or pre-ulcerative lesions.  No pain with calf compression, swelling, warmth, erythema  Assessment: Right foot Jones facture  Plan: -All treatment options discussed with the patient including all alternatives, risks, complications.  -X-rays were obtained and reviewed.  Radiolucent line in the fifth metatarsal metaphyseal diaphyseal junction with mild gapping consistent with a Jones fracture. -We discussed both conservative as well as surgical treatment options.  Given her A1c is over 9 her blood sugars been running over 200s I do not think that surgical intervention is warranted at this time given that as well as her other medical conditions.  I do recommend immobilization.  A cam boot was dispensed and recommended nonweightbearing.  Once the swelling goes down we will consider a cast.  I have ordered a bone stimulator as well as a knee scooter. -Patient encouraged to call the office with any questions, concerns, change  in symptoms.    RTC 3 weeks or sooner if needed. Repeat x-rays  Trula Slade DPM

## 2018-11-28 ENCOUNTER — Telehealth: Payer: Self-pay | Admitting: *Deleted

## 2018-11-28 DIAGNOSIS — S99191A Other physeal fracture of right metatarsal, initial encounter for closed fracture: Secondary | ICD-10-CM

## 2018-11-28 DIAGNOSIS — S99921A Unspecified injury of right foot, initial encounter: Secondary | ICD-10-CM

## 2018-11-28 NOTE — Telephone Encounter (Signed)
-----   Message from Trula Slade, DPM sent at 11/27/2018  5:25 PM EDT ----- Can you please order a knee scooter for her? She has a jones fracture right foot.

## 2018-11-28 NOTE — Telephone Encounter (Signed)
Faxed required form, clinicals and demographics to [employer] and emailed to M. Stenson, A. Catron. 

## 2018-11-29 DIAGNOSIS — J45909 Unspecified asthma, uncomplicated: Secondary | ICD-10-CM | POA: Diagnosis not present

## 2018-11-29 DIAGNOSIS — J449 Chronic obstructive pulmonary disease, unspecified: Secondary | ICD-10-CM | POA: Diagnosis not present

## 2018-11-29 DIAGNOSIS — M549 Dorsalgia, unspecified: Secondary | ICD-10-CM | POA: Diagnosis not present

## 2018-11-29 DIAGNOSIS — E1140 Type 2 diabetes mellitus with diabetic neuropathy, unspecified: Secondary | ICD-10-CM | POA: Diagnosis not present

## 2018-11-29 DIAGNOSIS — G4733 Obstructive sleep apnea (adult) (pediatric): Secondary | ICD-10-CM | POA: Diagnosis not present

## 2018-11-29 DIAGNOSIS — R911 Solitary pulmonary nodule: Secondary | ICD-10-CM | POA: Diagnosis not present

## 2018-11-29 DIAGNOSIS — M5416 Radiculopathy, lumbar region: Secondary | ICD-10-CM | POA: Diagnosis not present

## 2018-11-29 DIAGNOSIS — G894 Chronic pain syndrome: Secondary | ICD-10-CM | POA: Diagnosis not present

## 2018-11-29 DIAGNOSIS — S9780XA Crushing injury of unspecified foot, initial encounter: Secondary | ICD-10-CM | POA: Diagnosis not present

## 2018-11-29 DIAGNOSIS — S92353A Displaced fracture of fifth metatarsal bone, unspecified foot, initial encounter for closed fracture: Secondary | ICD-10-CM | POA: Diagnosis not present

## 2018-11-30 ENCOUNTER — Telehealth: Payer: Self-pay | Admitting: Dietician

## 2018-11-30 NOTE — Telephone Encounter (Addendum)
Did not upload cgm because the sensor malfunctioned. She fell and broke her foot. Please call.  Called her back and she says her blood sugars from her meter are 188/ 176/208/109/183/108/201/232/151/160/211/143/142. She thinks they are better because of her diet changes and the increased dose of ozempic. She agreed to let me know when she wants me to review her uploaded CGM readings

## 2018-12-02 ENCOUNTER — Encounter: Payer: Self-pay | Admitting: *Deleted

## 2018-12-03 ENCOUNTER — Telehealth: Payer: Self-pay | Admitting: Dietician

## 2018-12-03 NOTE — Telephone Encounter (Signed)
Assisted patient with returning call to the office.

## 2018-12-04 DIAGNOSIS — G4733 Obstructive sleep apnea (adult) (pediatric): Secondary | ICD-10-CM | POA: Diagnosis not present

## 2018-12-06 DIAGNOSIS — Z79899 Other long term (current) drug therapy: Secondary | ICD-10-CM | POA: Diagnosis not present

## 2018-12-06 DIAGNOSIS — M545 Low back pain: Secondary | ICD-10-CM | POA: Diagnosis not present

## 2018-12-06 DIAGNOSIS — M129 Arthropathy, unspecified: Secondary | ICD-10-CM | POA: Diagnosis not present

## 2018-12-07 ENCOUNTER — Telehealth: Payer: Self-pay | Admitting: Dietician

## 2018-12-07 NOTE — Telephone Encounter (Signed)
She thinks the new medicine is helping. Wants to know "Does that new medicine make your vision blurry". Has eye doctor appointment July 8 with Dr. Shirley Muscat.  Blood sugars improved on CGM upload especially over the past week all daily averages in 100s. Marland Kitchen However, only 27% active because of sensor malfunction and lack of adequate scans/day. This make data less reliable. . Feels she is making some healthier food choices.  Wants to bring her a1c down. Encouragement and support provided.  Debera Lat, RD 12/07/2018 4:23 PM.

## 2018-12-07 NOTE — Telephone Encounter (Signed)
Patient schedule to see me 6/29

## 2018-12-08 DIAGNOSIS — M545 Low back pain: Secondary | ICD-10-CM | POA: Diagnosis not present

## 2018-12-10 ENCOUNTER — Ambulatory Visit (INDEPENDENT_AMBULATORY_CARE_PROVIDER_SITE_OTHER): Payer: PPO | Admitting: Pharmacist

## 2018-12-10 ENCOUNTER — Other Ambulatory Visit: Payer: Self-pay

## 2018-12-10 DIAGNOSIS — Z794 Long term (current) use of insulin: Secondary | ICD-10-CM

## 2018-12-10 DIAGNOSIS — E1140 Type 2 diabetes mellitus with diabetic neuropathy, unspecified: Secondary | ICD-10-CM | POA: Diagnosis not present

## 2018-12-10 LAB — POCT GLYCOSYLATED HEMOGLOBIN (HGB A1C): Hemoglobin A1C: 9.8 % — AB (ref 4.0–5.6)

## 2018-12-10 LAB — GLUCOSE, CAPILLARY: Glucose-Capillary: 371 mg/dL — ABNORMAL HIGH (ref 70–99)

## 2018-12-10 MED ORDER — OZEMPIC (0.25 OR 0.5 MG/DOSE) 2 MG/1.5ML ~~LOC~~ SOPN: 0.5000 mg | PEN_INJECTOR | SUBCUTANEOUS | 3 refills | Status: DC

## 2018-12-10 NOTE — Progress Notes (Addendum)
S: [name] is a 58 y.o. female reports to clinical pharmacist appointment for help with medication side effect.  Allergies  Allergen Reactions  . Lisinopril Cough    Current Outpatient Medications:  .  acyclovir (ZOVIRAX) 400 MG tablet, Take 1 tablet (400 mg total) by mouth 3 (three) times daily as needed (flares)., Disp: 90 tablet, Rfl: 3 .  aspirin 81 MG EC tablet, Take 81 mg by mouth daily.  , Disp: , Rfl:  .  Blood Glucose Monitoring Suppl (ONETOUCH VERIO FLEX SYSTEM) w/Device KIT, 1 each by Does not apply route 3 (three) times daily., Disp: 1 kit, Rfl: 1 .  budesonide-formoterol (SYMBICORT) 80-4.5 MCG/ACT inhaler, Inhale 2 puffs into the lungs 2 (two) times daily. (Patient taking differently: Inhale 2 puffs into the lungs 2 (two) times daily. Pt states taking PRN), Disp: 1 Inhaler, Rfl: 0 .  chlorpheniramine (CHLOR-TRIMETON) 4 MG tablet, Take 4 mg by mouth every 4 (four) hours as needed for allergies., Disp: , Rfl:  .  Continuous Blood Gluc Sensor (FREESTYLE LIBRE 14 DAY SENSOR) MISC, 1 each by Does not apply route 4 (four) times daily., Disp: 2 each, Rfl: 12 .  Continuous Glucose Monitor DEVI, Use as directed., Disp: 1 each, Rfl: 0 .  gabapentin (NEURONTIN) 300 MG capsule, Take 1 capsule by mouth 4 times daily, Disp: 120 capsule, Rfl: 2 .  hydrochlorothiazide (HYDRODIURIL) 25 MG tablet, Take 1 tablet (25 mg total) by mouth daily., Disp: 90 tablet, Rfl: 3 .  Insulin Disposable Pump (V-GO 40) KIT, 1 kit by Does not apply route daily., Disp: 1 kit, Rfl: 2 .  Insulin Pen Needle (B-D UF III MINI PEN NEEDLES) 31G X 5 MM MISC, Use to inject insulin twice a day, Disp: 190 each, Rfl: 5 .  insulin regular human CONCENTRATED (HUMULIN R) 500 UNIT/ML injection, Use to fill Vgo daily.  Use 3-4 clicks with breakfast, 3-4 clicks with lunch, and 4-5 clicks with dinner.  Use 2 clicks with snacks twice daily., Disp: 3 vial, Rfl: 3 .  Insulin Syringe-Needle U-100 (INSULIN SYRINGE 1CC/30GX1/2") 30G X 1/2" 1  ML MISC, Use to fill Vgo daily, Disp: 100 each, Rfl: 5 .  irbesartan (AVAPRO) 150 MG tablet, Take 2 tablets (300 mg total) by mouth daily., Disp: 90 tablet, Rfl: 3 .  metFORMIN (GLUCOPHAGE) 1000 MG tablet, TAKE 1 TABLET BY MOUTH TWICE DAILY WITH A MEAL, Disp: 90 tablet, Rfl: 0 .  montelukast (SINGULAIR) 10 MG tablet, Take 1 tablet (10 mg total) by mouth at bedtime., Disp: 30 tablet, Rfl: 11 .  Multiple Vitamin (MULTIVITAMIN WITH MINERALS) TABS tablet, Take 1 tablet by mouth daily., Disp: , Rfl:  .  ONETOUCH DELICA LANCETS FINE MISC, Check blood sugar 3 times a day, Disp: 100 each, Rfl: 12 .  ONETOUCH VERIO test strip,  CHECK BLOOD SUGAR 3 TIMES A DAY, Disp: 100 each, Rfl: 5 .  oxyCODONE-acetaminophen (PERCOCET) 5-325 MG tablet, Take 1 tablet by mouth every 8 (eight) hours as needed for severe pain., Disp: 25 tablet, Rfl: 0 .  pantoprazole (PROTONIX) 40 MG tablet, Take 1 tablet by mouth twice daily, Disp: 180 tablet, Rfl: 0 .  rosuvastatin (CRESTOR) 20 MG tablet, Take 1 tablet (20 mg total) by mouth at bedtime., Disp: 90 tablet, Rfl: 3 .  Semaglutide,0.25 or 0.5MG/DOS, (OZEMPIC, 0.25 OR 0.5 MG/DOSE,) 2 MG/1.5ML SOPN, Inject 0.5 mg into the skin once a week., Disp: 1.5 mL, Rfl: 3 .  valACYclovir (VALTREX) 500 MG tablet, TAKE 1 TABLET BY  MOUTH TWICE DAILY FOR 5 DAYS AT TIME OF OUTBREAK AND 1 EVERY DAY FOR SUPPRESSION, Disp: , Rfl:  Past Medical History:  Diagnosis Date  . Allergy   . Arthritis    back   . Asthma    AS CHILD  . Chronic back pain   . Chronic leg pain    due to back pain  . 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 Diabetes ([place])    with neuropathy  . GERD (gastroesophageal reflux disease)   . Hyperlipidemia   . Hypertension   . Neuromuscular disorder ([facility])    neuropathy  . RECTAL BLEEDING 12/09/2008   Annotation: s/p EGD 7/08 mild gastritis, s/p colonoscopy 7/08- benign polyp  s/p polypectomy and isolated diverticulum.  Qualifier: Diagnosis of  By: Ditzler RN, Debra    .  Sleep apnea    CPAP    YEARS AGO DONE 1/2 YEARS AGO AND WAS TOLD DID NOT HAVE  . Tobacco abuse   . Uterine fibroid    s/p hysterectomy   Social History   Socioeconomic History  . Marital status: Married    Spouse name: Not on file  . Number of children: 3  . Years of education: Not on file  . Highest education level: Not on file  Occupational History  . 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    Employer: [employer]  Social Needs  . Financial resource strain: Not on file  . Food insecurity    Worry: Not on file    Inability: Not on file  . Transportation needs    Medical: Not on file    Non-medical: Not on file  Tobacco Use  . Smoking status: Former Smoker    Packs/day: 0.50    Years: 20.00    Pack years: 10.00    Types: Cigarettes    Quit date: 07/23/1996    Years since quitting: 22.3  . Smokeless tobacco: Never Used  Substance and Sexual Activity  . Alcohol use: No    Alcohol/week: 0.0 standard drinks    Comment: recovering addict clean for 9 years  . Drug use: No    Types: Cocaine, Marijuana    Comment: recovering addict clean for 9 years  . Sexual activity: Not on file  Lifestyle  . Physical activity    Days per week: Not on file    Minutes per session: Not on file  . Stress: Not on file  Relationships  . Social Herbalist on phone: Not on file    Gets together: Not on file    Attends religious service: Not on file    Active member of club or organization: Not on file    Attends meetings of clubs or organizations: Not on file    Relationship status: Not on file  Other Topics Concern  . Not on file  Social History Narrative   Unemployed but has started taking community college classes (interested in accounting) since having back surgery, which has improved pain and mobility. Hopes to return to work soon. Lives with husband and daughter.  Uninsured.   Family History  Problem Relation Age of Onset  . Diabetes Father   . Heart disease  Father   . Hypertension Father   . Diabetes Mother   . Cancer Mother        brain  . Hypertension Mother   . Kidney disease Sister   . Diabetes Sister   . Kidney cancer Sister   .  Colon cancer Neg Hx   . Colon polyps Neg Hx   . Esophageal cancer Neg Hx   . Rectal cancer Neg Hx   . Stomach cancer Neg Hx    O: Component Value Date/Time   CHOL 149 01/10/2017 1634   HDL 40 01/10/2017 1634   LDLCALC 87 01/10/2017 1634   TRIG 112 01/10/2017 1634   GLUCOSE 189 (H) 11/14/2017 1424   GLUCOSE 146 (H) 07/27/2016 0950   HGBA1C 9.8 (A) 12/10/2018 1150   HGBA1C 8.5 03/25/2010 0918   NA 140 11/14/2017 1424   K 4.4 11/14/2017 1424   CL 97 11/14/2017 1424   CO2 27 11/14/2017 1424   BUN 9 11/14/2017 1424   CREATININE 0.74 11/14/2017 1424   CREATININE 0.66 11/20/2014 1507   CALCIUM 10.5 (H) 11/14/2017 1424   GFRNONAA 90 11/14/2017 1424   GFRNONAA >89 06/15/2012 1403   GFRAA 104 11/14/2017 1424   GFRAA >89 06/15/2012 1403   AST 34 08/15/2017 1559   ALT 33 (H) 08/15/2017 1559   WBC 5.7 03/26/2018 0934   HGB 13.2 03/26/2018 0934   HCT 40.9 03/26/2018 0934   PLT 248.0 03/26/2018 0934   TSH 1.36 07/27/2016 0950   Ht Readings from Last 2 Encounters:  11/26/18 5' 3" (1.6 m)  10/31/18 5' 4" (1.626 m)   Wt Readings from Last 2 Encounters:  11/26/18 255 lb 12.8 oz (116 kg)  10/31/18 256 lb (116.1 kg)   There is no height or weight on file to calculate BMI. BP Readings from Last 3 Encounters:  11/26/18 124/80  10/31/18 (!) 146/70  08/21/18 (!) 168/74   A/P:  Patient states she is experiencing blurry vision weekly after taking semaglutide dose. She is taking 1 mg weekly, advised patient to reduce to 0.5 mg weekly.  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  A1C 9.8 today so will likely need further insulin titration. Patient did not bring home BG meter to clinic. Libreview online shows average BG of 172 (correlates with A1C around 7.6).  Will follow up with  patient to help with insulin titration (can also consider switching from semaglutide to dulaglutide).  An after visit summary was provided and patient advised to follow up if any changes in condition or questions regarding medications arise.   The patient verbalized understanding of information provided by repeating back concepts discussed.

## 2018-12-10 NOTE — Progress Notes (Signed)
98%

## 2018-12-11 NOTE — Telephone Encounter (Signed)
No problem, Dr. Marva Panda! I will try to work with patient on insulin titration, but hoping the blurry vision will improve after reducing the semaglutide dose. Patient did confirm she has an appointment scheduled next month to evaluate her symptoms. Thank you!

## 2018-12-11 NOTE — Telephone Encounter (Signed)
Thank you Dr. Maudie Mercury! I appreciate all your help

## 2018-12-11 NOTE — Telephone Encounter (Signed)
Thank you :)

## 2018-12-12 ENCOUNTER — Telehealth: Payer: Self-pay | Admitting: Dietician

## 2018-12-12 NOTE — Telephone Encounter (Signed)
Called [name] to discuss an appointment to discuss her diabetes care. She reports her blood sugar was 286 yesterday-she did not know why and thought it might be because she did not feel well, Her blood sugar today was 236 fasting. She did not see 0.5mg  on her ozempic pen, so took the 1 mg since blood sugar was higher. Her vision is improved, and she has an eye doctor appointment on 12/19/18.   She would like an appointment to meet her new doctor and me on the same day. Will ask front office to arrange this. [name], RD 12/12/2018 12:49 PM.

## 2018-12-13 ENCOUNTER — Encounter: Payer: Self-pay | Admitting: Podiatry

## 2018-12-13 ENCOUNTER — Other Ambulatory Visit: Payer: Self-pay

## 2018-12-13 ENCOUNTER — Ambulatory Visit (INDEPENDENT_AMBULATORY_CARE_PROVIDER_SITE_OTHER): Payer: PPO

## 2018-12-13 ENCOUNTER — Ambulatory Visit: Payer: PPO | Admitting: Podiatry

## 2018-12-13 DIAGNOSIS — S99191A Other physeal fracture of right metatarsal, initial encounter for closed fracture: Secondary | ICD-10-CM | POA: Diagnosis not present

## 2018-12-13 DIAGNOSIS — S99191D Other physeal fracture of right metatarsal, subsequent encounter for fracture with routine healing: Secondary | ICD-10-CM

## 2018-12-13 DIAGNOSIS — M545 Low back pain: Secondary | ICD-10-CM | POA: Diagnosis not present

## 2018-12-13 DIAGNOSIS — R768 Other specified abnormal immunological findings in serum: Secondary | ICD-10-CM | POA: Diagnosis not present

## 2018-12-13 DIAGNOSIS — G8929 Other chronic pain: Secondary | ICD-10-CM | POA: Diagnosis not present

## 2018-12-13 DIAGNOSIS — R748 Abnormal levels of other serum enzymes: Secondary | ICD-10-CM | POA: Diagnosis not present

## 2018-12-13 DIAGNOSIS — Z79899 Other long term (current) drug therapy: Secondary | ICD-10-CM | POA: Diagnosis not present

## 2018-12-13 MED ORDER — METFORMIN HCL 1000 MG PO TABS: ORAL_TABLET | ORAL | 0 refills | Status: DC

## 2018-12-19 DIAGNOSIS — H11153 Pinguecula, bilateral: Secondary | ICD-10-CM | POA: Diagnosis not present

## 2018-12-19 DIAGNOSIS — H2511 Age-related nuclear cataract, right eye: Secondary | ICD-10-CM | POA: Diagnosis not present

## 2018-12-19 DIAGNOSIS — E119 Type 2 diabetes mellitus without complications: Secondary | ICD-10-CM | POA: Diagnosis not present

## 2018-12-19 DIAGNOSIS — H04123 Dry eye syndrome of bilateral lacrimal glands: Secondary | ICD-10-CM | POA: Diagnosis not present

## 2018-12-19 DIAGNOSIS — H25011 Cortical age-related cataract, right eye: Secondary | ICD-10-CM | POA: Diagnosis not present

## 2018-12-19 DIAGNOSIS — H0102A Squamous blepharitis right eye, upper and lower eyelids: Secondary | ICD-10-CM | POA: Diagnosis not present

## 2018-12-19 DIAGNOSIS — H0102B Squamous blepharitis left eye, upper and lower eyelids: Secondary | ICD-10-CM | POA: Diagnosis not present

## 2018-12-19 DIAGNOSIS — H40031 Anatomical narrow angle, right eye: Secondary | ICD-10-CM | POA: Diagnosis not present

## 2018-12-19 DIAGNOSIS — H1011 Acute atopic conjunctivitis, right eye: Secondary | ICD-10-CM | POA: Diagnosis not present

## 2018-12-19 DIAGNOSIS — H1045 Other chronic allergic conjunctivitis: Secondary | ICD-10-CM | POA: Diagnosis not present

## 2018-12-19 DIAGNOSIS — H40013 Open angle with borderline findings, low risk, bilateral: Secondary | ICD-10-CM | POA: Diagnosis not present

## 2018-12-19 DIAGNOSIS — H18413 Arcus senilis, bilateral: Secondary | ICD-10-CM | POA: Diagnosis not present

## 2018-12-19 LAB — HM DIABETES EYE EXAM

## 2018-12-20 DIAGNOSIS — G4733 Obstructive sleep apnea (adult) (pediatric): Secondary | ICD-10-CM | POA: Diagnosis not present

## 2018-12-21 ENCOUNTER — Telehealth: Payer: Self-pay | Admitting: Dietician

## 2018-12-21 DIAGNOSIS — R748 Abnormal levels of other serum enzymes: Secondary | ICD-10-CM | POA: Diagnosis not present

## 2018-12-21 NOTE — Telephone Encounter (Signed)
Carma Lair to follow up in her Eye exam this week which was "good" per Butch Penny. The eye doctor wants her blood sugars down to 150mg /dl. In addition, I confirmed her ozempic dose is back to 0.5mg /daily.  Blood sugars- this am was 80 mg/dl. Her spouse says due to No snack last night- only had cheeseburger, french fries for dinner and that was it. I offered assistance with menus and planning and she responded " It's just on me- monitoring what I eat right now". She reports her broken bone in her foot is doing better, boot and scooter for another month. We agreed that she would contact me as needed for CGM upload or dietary assistance.  Debera Lat, RD 12/21/2018 2:47 PM.

## 2018-12-25 ENCOUNTER — Ambulatory Visit: Payer: PPO | Admitting: Dietician

## 2018-12-25 ENCOUNTER — Encounter: Payer: PPO | Admitting: Internal Medicine

## 2018-12-26 NOTE — Progress Notes (Signed)
Subjective: 58 year old female presents the office today for evaluation of Jones fracture right foot.  She states that she still gets some discomfort at max pain 7/10.  Pain is intermittent.  She has been nonweightbearing she has a knee scooter.  She is wearing a cam boot. Denies any systemic complaints such as fevers, chills, nausea, vomiting. No acute changes since last appointment, and no other complaints at this time.   Objective: AAO x3, NAD DP/PT pulses palpable bilaterally, CRT less than 3 seconds Tenderness along the fifth metatarsal base.  No pain on the course of the peroneal tendon.  Achilles tendon, peroneal, flexor tendons appear to be intact.  No other areas of pinpoint tenderness. No open lesions or pre-ulcerative lesions.  No pain with calf compression, swelling, warmth, erythema  Assessment: Jones fracture right foot with uncontrolled diabetes  Plan: -All treatment options discussed with the patient including all alternatives, risks, complications.  -X-rays were obtained reviewed.  Some mild consolidation across the fracture site. No evidence of acute fracture otherwise. -Remain nonweightbearing.  Remain in cam boot.  Elevation. -Awaiting bone stimulator. -Patient encouraged to call the office with any questions, concerns, change in symptoms.   Trula Slade DPM

## 2018-12-27 DIAGNOSIS — M5136 Other intervertebral disc degeneration, lumbar region: Secondary | ICD-10-CM | POA: Diagnosis not present

## 2018-12-27 DIAGNOSIS — M545 Low back pain: Secondary | ICD-10-CM | POA: Diagnosis not present

## 2018-12-27 DIAGNOSIS — Z79899 Other long term (current) drug therapy: Secondary | ICD-10-CM | POA: Diagnosis not present

## 2018-12-27 DIAGNOSIS — R160 Hepatomegaly, not elsewhere classified: Secondary | ICD-10-CM | POA: Diagnosis not present

## 2018-12-27 DIAGNOSIS — G8929 Other chronic pain: Secondary | ICD-10-CM | POA: Diagnosis not present

## 2019-01-03 DIAGNOSIS — G4733 Obstructive sleep apnea (adult) (pediatric): Secondary | ICD-10-CM | POA: Diagnosis not present

## 2019-01-10 ENCOUNTER — Ambulatory Visit (INDEPENDENT_AMBULATORY_CARE_PROVIDER_SITE_OTHER): Payer: PPO

## 2019-01-10 ENCOUNTER — Ambulatory Visit: Payer: PPO | Admitting: Podiatry

## 2019-01-10 ENCOUNTER — Other Ambulatory Visit: Payer: Self-pay

## 2019-01-10 DIAGNOSIS — S99191D Other physeal fracture of right metatarsal, subsequent encounter for fracture with routine healing: Secondary | ICD-10-CM | POA: Diagnosis not present

## 2019-01-10 DIAGNOSIS — R7309 Other abnormal glucose: Secondary | ICD-10-CM

## 2019-01-10 DIAGNOSIS — E559 Vitamin D deficiency, unspecified: Secondary | ICD-10-CM

## 2019-01-10 NOTE — Progress Notes (Signed)
Subjective: 58 year old female presents the office today for evaluation of Jones fracture right foot.  She states that she feels that she is doing better but she does get pain at times.  She is been wearing the cam boot.  He still gets the swelling.  She states that her last A1c was 9.8.  She has no other concerns. Denies any systemic complaints such as fevers, chills, nausea, vomiting. No acute changes since last appointment, and no other complaints at this time.   Objective: AAO x3, NAD DP/PT pulses palpable bilaterally, CRT less than 3 seconds Tenderness along the fifth metatarsal base.  There is still some mild swelling to the area there is no erythema or warmth.  No pain with peroneal tendons.  No other areas of pinpoint tenderness. No open lesions or pre-ulcerative lesions.  No pain with calf compression, swelling, warmth, erythema  Assessment: Jones fracture right foot with uncontrolled diabetes  Plan: -All treatment options discussed with the patient including all alternatives, risks, complications.  -X-rays were obtained reviewed.  Radiolucent line still evident along the fracture site.  There is some mild displacement of the fracture site noted on the lateral view compared to last x-ray. -Ultimately I would recommend surgical intervention for percutaneous screw fixation however still would require an incision.  Given her uncontrolled diabetes I am hesitant to do surgery to make the incision.  Would recheck an A1c although was rechecked last month.  I reviewed her glucometer today and it runs between 92-210. I have ordered a new A1c and Vitamin D  Trula Slade DPM

## 2019-01-11 DIAGNOSIS — R7309 Other abnormal glucose: Secondary | ICD-10-CM | POA: Diagnosis not present

## 2019-01-11 DIAGNOSIS — E559 Vitamin D deficiency, unspecified: Secondary | ICD-10-CM | POA: Diagnosis not present

## 2019-01-12 LAB — VITAMIN D 25 HYDROXY (VIT D DEFICIENCY, FRACTURES): Vit D, 25-Hydroxy: 20 ng/mL — ABNORMAL LOW (ref 30–100)

## 2019-01-12 LAB — HEMOGLOBIN A1C
Hgb A1c MFr Bld: 9.7 % of total Hgb — ABNORMAL HIGH (ref <5.7)
Mean Plasma Glucose: 232 (calc)
eAG (mmol/L): 12.8 (calc)

## 2019-01-14 ENCOUNTER — Other Ambulatory Visit: Payer: Self-pay | Admitting: Podiatry

## 2019-01-14 ENCOUNTER — Telehealth: Payer: Self-pay | Admitting: *Deleted

## 2019-01-14 DIAGNOSIS — E559 Vitamin D deficiency, unspecified: Secondary | ICD-10-CM

## 2019-01-14 DIAGNOSIS — S99191D Other physeal fracture of right metatarsal, subsequent encounter for fracture with routine healing: Secondary | ICD-10-CM

## 2019-01-14 MED ORDER — VITAMIN D (ERGOCALCIFEROL) 1.25 MG (50000 UNIT) PO CAPS: 50000.0000 [IU] | ORAL_CAPSULE | ORAL | 0 refills | Status: DC

## 2019-01-14 NOTE — Telephone Encounter (Signed)
I informed pt of Dr. Leigh Aurora review of results and orders. Pt states understanding.

## 2019-01-14 NOTE — Telephone Encounter (Signed)
-----   Message from Trula Slade, DPM sent at 01/14/2019  7:17 AM EDT ----- Val- please let her know that her vitamin D level is low. I sent in vitamin D 50,000 units weekly for 5 weeks. Will recheck then. Also her A1c is the same. Continue CAM boot, elevation and try to stay off of the foot as much as possible.

## 2019-01-21 NOTE — Addendum Note (Signed)
Addended by: Forde Dandy on: 01/21/2019 09:09 AM   Modules accepted: Orders

## 2019-01-29 DIAGNOSIS — G8929 Other chronic pain: Secondary | ICD-10-CM | POA: Diagnosis not present

## 2019-01-29 DIAGNOSIS — M545 Low back pain: Secondary | ICD-10-CM | POA: Diagnosis not present

## 2019-01-29 DIAGNOSIS — Z79899 Other long term (current) drug therapy: Secondary | ICD-10-CM | POA: Diagnosis not present

## 2019-01-29 DIAGNOSIS — M5136 Other intervertebral disc degeneration, lumbar region: Secondary | ICD-10-CM | POA: Diagnosis not present

## 2019-01-31 ENCOUNTER — Encounter: Payer: Self-pay | Admitting: Internal Medicine

## 2019-01-31 ENCOUNTER — Other Ambulatory Visit: Payer: Self-pay

## 2019-01-31 ENCOUNTER — Ambulatory Visit (INDEPENDENT_AMBULATORY_CARE_PROVIDER_SITE_OTHER): Payer: PPO | Admitting: Internal Medicine

## 2019-01-31 DIAGNOSIS — R05 Cough: Secondary | ICD-10-CM

## 2019-01-31 DIAGNOSIS — R053 Chronic cough: Secondary | ICD-10-CM

## 2019-01-31 DIAGNOSIS — J449 Chronic obstructive pulmonary disease, unspecified: Secondary | ICD-10-CM | POA: Diagnosis not present

## 2019-01-31 NOTE — Patient Instructions (Signed)
Patient has MyChart   No change in meds needed - if having any trouble with breathing/ wheezing rec full dose symbicort = 2 puffs every 12 hours and if still not doing better > return to clinic asap with all meds/ inhalers    If you are satisfied with your treatment plan,  let your doctor know and he/she can either refill your medications or you can return here when your prescription runs out.     If in any way you are not 100% satisfied,  please tell us.  If 100% better, tell your friends!  Pulmonary follow up is as needed

## 2019-01-31 NOTE — Progress Notes (Signed)
Subjective:     Patient ID: [name], female   DOB: 1960/08/24,  MRN: [id number]    Brief patient profile:  [id number] quit smoking 07/1998 says born with asthma remembers freq er trips and allergy eval by Dr Bernita Buffy on shots >> reduced    need for "the pump" for breathing / some better after quit smoking then  referred to pulmonary clinic 11/16/2017 by Dr   Chase Caller for refractory cough since around 2015   History of Present Illness  11/16/2017 1st  office visit/ [facility]   Chief Complaint  Patient presents with  . Pulmonary Consult    Second opinion on cough per Dr Chase Caller. She states she has been coughing for the past 2-3 years. She states she occ produces some clear sputum. She has noticed that the cough can be triggered by talking. It will occ wake her up in the night.   has been eval by S wright  Breathing is fine as long as not coughing   [name] Reflux v Neurogenic Cough Differentiator Reflux Comments  Do you awaken from a sound sleep coughing violently?                            With trouble breathing? 2 x per week no   Do you have choking episodes when you cannot  Get enough air, gasping for air ?              no   Do you usually cough when you lie down into  The bed, or when you just lie down to rest ?                          sometimes   Do you usually cough after meals or eating?         2-3 week   Do you cough when (or after) you bend over?    no   GERD SCORE     [name] Reflux v Neurogenic Cough Differentiator Neurogenic   Do you more-or-less cough all day long? sporadic   Does change of temperature make you cough? Yes hot   Does laughing or chuckling cause you to cough? yes   Do fumes (perfume, automobile fumes, burned  Toast, etc.,) cause you to cough ?      yes   Does speaking, singing, or talking on the phone cause you to cough   ?               Sometimes    Neurogenic/Airway score      rec Change gabapentin to 300 mg three times daily  For drainage / throat  tickle try take CHLORPHENIRAMINE  4 mg - take one every 4 hours as needed - available over the counter- may cause drowsiness so start with just a bedtime dose or two and see how you tolerate it before trying in daytime   Stop  nexium  And continue pantoprazole 40 mg Take 30- 60 min before your first and last meals of the day and Zantac (ranitidine) at bedtime  Change symbicort 80 to where you take it up to 2 puff every 12 hours only if you feel you really need it for breathing or if it helps your cough GERD diet      12/29/2017  f/u ov/[facility]ert re: cough since 2015 / brought meds but totally confused concept of maint vs prns and did not  restart gabapentin / no worse off symbicort  Chief Complaint  Patient presents with  . Follow-up    Cough had improved some and then worsened again 2 wks ago- occ prod with clear sputum. She is using her albuterol inhaler 1 x per wk on average.   cough never completely resolved / still using lots of  mints Dyspnea:  Only when coughing or steps = MMRC1 = can walk nl pace, flat grade, can't hurry or go uphills or steps s sob   Cough is dry day > noct esp with use of voice/ laughter  Has not needed saba much at all since stopped symbicort  rec Start  gabapentin to 300 mg three times daily  For drainage / throat tickle continue CHLORPHENIRAMINE  4 mg - take one every 4 hours as needed - available over the counter- may cause drowsiness so   Continue bedtime dose  X  two  Then take it every 4 hours during the day just  as needed for throat tickle  Stop  nexium  And continue pantoprazole 40 mg Take 30- 60 min before your first and last meals of the day and Zantac (ranitidine) at bedtime  GERD  See [name] NP in 4 weeks with all your medications>  Medication calendar done but not following      03/26/2018  f/u ov/Wert re: chewing mint gum/ occ throat clearing / did not bring med calendar  Chief Complaint  Patient presents with  . Follow-up    Cough has resovled. She  rarely uses her albuterol inhaler.   Dyspnea:  Steps x one flight and stop at top/ back stops her first  if walking flat surface Cough: not at all at noct/ just using one h1 per day and 2 at hs  Sleeping: fine on cpap / p 2 chlorpheniramine 4mg  x 2   SABA use:  Very rare inhaler / has symbicort not using  Overt hb  despite ppi bid   rec Increase gabapentin to 300 mg four times a day as per you new med calendar today GERD diet  Please schedule a follow up office visit in 4 weeks, sooner if needed - bring your med calendar with you that is  dated today and keep up with it - enter any changes in meantime from other doctors if changes are made       10/31/2018  f/u ov/Wert re: uacs worse x one month / not following med calendar, did not bring meds  Chief Complaint  Patient presents with  . Follow-up    Increased cough for the past month- non prod    Dyspnea:  MMRC1 = can walk nl pace, flat grade, can't hurry or go uphills or steps s sob   Cough:dry but assoc with sensation pnds better when taking 1st gen H1 blockers per guidelines   Sleeping: cpap mask getting old "drying out nose due to leakage" > has not returned to dr who did original rx Eppie Gibson) and outpt clinic not responding to her calls (per pt)  SABA use: says ventolin out of date but records show refilled in feb 2020 with two more refills approved   02: none  rec Add singulair 10 mg one daily in evening  For cough/wheeze/ short of breath > symbicort 80 up to 2 pffs every 12 hours Work on inhaler technique:   For drainage / throat tickle try take CHLORPHENIRAMINE  4 mg    Virtual Visit via Telephone Note 01/31/2019  I connected with [name] on 01/31/19 at   8:20 am by telephone and verified that I am speaking with the correct person using two identifiers.   I discussed the limitations, risks, security and privacy concerns of performing an evaluation and management service by telephone and the availability of in person  appointments. I also discussed with the patient that there may be a patient responsible charge related to this service. The patient expressed understanding and agreed to proceed.    History of Present Illness:  ? Cough variant asthma vs uacs singulair  Dyspnea:  No change = MMRC1 = can walk nl pace, flat grade, can't hurry or go uphills or steps s sob   Cough: gone to her satisfaction on h1  Sleeping: seeing  Dr young / on cpap and doing fine SABA use: none 02: none    No obvious day to day or daytime variability or assoc excess/ purulent sputum or mucus plugs or hemoptysis or cp or chest tightness, subjective wheeze or overt sinus or hb symptoms.    Also denies any obvious fluctuation of symptoms with weather or environmental changes or other aggravating or alleviating factors except as outlined above.   Meds reviewed/ med reconciliation completed     Observations/Objective: Sounds great / no cough or apparent wob over the phone   Assessment and Plan: See problem list for active a/p's   Follow Up Instructions: See avs for instructions unique to this ov which includes revised/ updated med list     I discussed the assessment and treatment plan with the patient. The patient was provided an opportunity to ask questions and all were answered. The patient agreed with the plan and demonstrated an understanding of the instructions.   The patient was advised to call back or seek an in-person evaluation if the symptoms worsen or if the condition fails to improve as anticipated.  I provided 25  minutes of non-face-to-face time during this encounter.   Christinia Gully, MD

## 2019-01-31 NOTE — Assessment & Plan Note (Signed)
Onset around 2015 CT sinus 02/23/17 neg for sinus dz - FENO 11/16/2017  =  12 - Spirometry 11/16/2017  FEV1 1.33 (62%)  Ratio 69 p am symb 80 x 2 > changed to prn 11/16/17  - 11/16/2017 trial of gabapentin 300 tid / max gerd rx/ 1st gen H1 blockers per guidelines   > did not follow instructions > repeat same rx 12/29/2017  - Allergy profile 03/26/2018 >  Eos 0.2 /  IgE 27 RAST pos ragweed, grass, tree   - 03/26/2018 increased gabapentin to 300 mg qid> improved 04/23/2018 so no change rx - 10/31/2018 flared spring time rec add singulair and increase prn 1st gen H1 blockers  - cough resolved to her satisfaction on 1st gen H1 blockers per guidelines     Adequate control on present rx, reviewed in detail with pt > no change in rx needed  / no evidence of cough related to asthma but if flares on rx for uacs can always try adding back symbicort 80 2bid as maint then rtc if not improved to her satisfaction.  Each maintenance medication was reviewed in detail including most importantly the difference between maintenance and as needed and under what circumstances the prns are to be used.  Please see AVS for specific  Instructions which are unique to this visit and I personally typed out  which were reviewed in detail over the phone  with the patient and a copy provided via MyChart

## 2019-01-31 NOTE — Assessment & Plan Note (Signed)
Quit smoking 07/1998 PFTs  May 24, 2013  that showed moderate airflow obstruction with FEV1 is 1.35  59%. Ratio 62. No sign change BD  - Spirometry 11/16/2017  FEV1 1.33 (62%)  Ratio 69 p am symbicort  - 04/23/2018 reported doing fine so d/c maint inhalers - 10/31/2018  After extensive coaching inhaler device,  effectiveness =    75 % from 25% baseline > continue symb 80 up to 2 pffs q 12 h prn  Really not limited from desired activities on just symb 80 2 prn symptoms so f/u in pulmonary clinic can be prn

## 2019-02-01 ENCOUNTER — Telehealth: Payer: Self-pay | Admitting: Dietician

## 2019-02-01 DIAGNOSIS — E1140 Type 2 diabetes mellitus with diabetic neuropathy, unspecified: Secondary | ICD-10-CM

## 2019-02-01 NOTE — Telephone Encounter (Signed)
Called [name] to let her know I would not be here the day she meets with Dr. Marva Panda, but she can upload her CGM the Friday before if she'd like. Discussed her recent a1C result that is unchanged and suggested options. [name] agreed to go online and look at insulin pump options. She requests a Freestyle libre 2 prescription

## 2019-02-03 DIAGNOSIS — G4733 Obstructive sleep apnea (adult) (pediatric): Secondary | ICD-10-CM | POA: Diagnosis not present

## 2019-02-12 ENCOUNTER — Encounter: Payer: Self-pay | Admitting: Internal Medicine

## 2019-02-12 ENCOUNTER — Ambulatory Visit (INDEPENDENT_AMBULATORY_CARE_PROVIDER_SITE_OTHER): Payer: PPO | Admitting: Internal Medicine

## 2019-02-12 ENCOUNTER — Other Ambulatory Visit: Payer: Self-pay

## 2019-02-12 VITALS — BP 143/76 | HR 102 | Temp 98.9°F | Ht 64.0 in | Wt 256.4 lb

## 2019-02-12 DIAGNOSIS — G8929 Other chronic pain: Secondary | ICD-10-CM | POA: Diagnosis not present

## 2019-02-12 DIAGNOSIS — M545 Low back pain, unspecified: Secondary | ICD-10-CM

## 2019-02-12 DIAGNOSIS — K219 Gastro-esophageal reflux disease without esophagitis: Secondary | ICD-10-CM

## 2019-02-12 DIAGNOSIS — Z79899 Other long term (current) drug therapy: Secondary | ICD-10-CM | POA: Diagnosis not present

## 2019-02-12 DIAGNOSIS — J449 Chronic obstructive pulmonary disease, unspecified: Secondary | ICD-10-CM

## 2019-02-12 DIAGNOSIS — Z79891 Long term (current) use of opiate analgesic: Secondary | ICD-10-CM | POA: Diagnosis not present

## 2019-02-12 DIAGNOSIS — M549 Dorsalgia, unspecified: Secondary | ICD-10-CM | POA: Diagnosis not present

## 2019-02-12 DIAGNOSIS — E785 Hyperlipidemia, unspecified: Secondary | ICD-10-CM | POA: Diagnosis not present

## 2019-02-12 DIAGNOSIS — Z23 Encounter for immunization: Secondary | ICD-10-CM | POA: Diagnosis not present

## 2019-02-12 DIAGNOSIS — E1140 Type 2 diabetes mellitus with diabetic neuropathy, unspecified: Secondary | ICD-10-CM | POA: Diagnosis not present

## 2019-02-12 DIAGNOSIS — Z9641 Presence of insulin pump (external) (internal): Secondary | ICD-10-CM

## 2019-02-12 DIAGNOSIS — I10 Essential (primary) hypertension: Secondary | ICD-10-CM

## 2019-02-12 DIAGNOSIS — Z794 Long term (current) use of insulin: Secondary | ICD-10-CM | POA: Diagnosis not present

## 2019-02-12 DIAGNOSIS — A6004 Herpesviral vulvovaginitis: Secondary | ICD-10-CM

## 2019-02-12 DIAGNOSIS — R350 Frequency of micturition: Secondary | ICD-10-CM

## 2019-02-12 DIAGNOSIS — B009 Herpesviral infection, unspecified: Secondary | ICD-10-CM

## 2019-02-12 LAB — GLUCOSE, CAPILLARY: Glucose-Capillary: 294 mg/dL — ABNORMAL HIGH (ref 70–99)

## 2019-02-12 MED ORDER — HYDROCHLOROTHIAZIDE 25 MG PO TABS: 25.0000 mg | ORAL_TABLET | Freq: Every day | ORAL | 3 refills | Status: DC

## 2019-02-12 MED ORDER — FREESTYLE LIBRE 14 DAY SENSOR MISC: 1.0000 | Freq: Four times a day (QID) | 12 refills | Status: DC

## 2019-02-12 MED ORDER — BUDESONIDE-FORMOTEROL FUMARATE 80-4.5 MCG/ACT IN AERO: 2.0000 | INHALATION_SPRAY | Freq: Two times a day (BID) | RESPIRATORY_TRACT | 2 refills | Status: DC

## 2019-02-12 MED ORDER — METFORMIN HCL 1000 MG PO TABS: ORAL_TABLET | ORAL | 0 refills | Status: DC

## 2019-02-12 MED ORDER — GABAPENTIN 300 MG PO CAPS: 300.0000 mg | ORAL_CAPSULE | Freq: Two times a day (BID) | ORAL | 2 refills | Status: DC

## 2019-02-12 MED ORDER — VITAMIN D (ERGOCALCIFEROL) 1.25 MG (50000 UNIT) PO CAPS: 50000.0000 [IU] | ORAL_CAPSULE | ORAL | 0 refills | Status: DC

## 2019-02-12 MED ORDER — PANTOPRAZOLE SODIUM 40 MG PO TBEC: 40.0000 mg | DELAYED_RELEASE_TABLET | Freq: Two times a day (BID) | ORAL | 0 refills | Status: DC

## 2019-02-12 MED ORDER — ACYCLOVIR 400 MG PO TABS: 400.0000 mg | ORAL_TABLET | Freq: Three times a day (TID) | ORAL | 3 refills | Status: DC | PRN

## 2019-02-12 MED ORDER — SOLIQUA 100-33 UNT-MCG/ML ~~LOC~~ SOPN: 15.0000 [IU] | PEN_INJECTOR | Freq: Every day | SUBCUTANEOUS | 3 refills | Status: DC

## 2019-02-12 NOTE — Progress Notes (Signed)
   CC: diabetes follow up   HPI:  [name] is a 58 y.o. female with PMHx as listed below presenting to clinic for follow up on diabetes management. Please see problem based charting for further assessment and plan.  Past Medical History:  Diagnosis Date  . Allergy   . Arthritis    back   . Asthma    AS CHILD  . Chronic back pain   . Chronic leg pain    due to back pain  . Cocaine abuse ([place])    in remission  . 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 GERD (gastroesophageal reflux disease)   . Hyperlipidemia   . Hypertension   . Neuromuscular disorder ([facility])    neuropathy  . RECTAL BLEEDING 12/09/2008   Annotation: s/p EGD 7/08 mild gastritis, s/p colonoscopy 7/08- benign polyp  s/p polypectomy and isolated diverticulum.  Qualifier: Diagnosis of  By: Ditzler RN, Debra    . Sleep apnea    CPAP    YEARS AGO DONE 1/2 YEARS AGO AND WAS TOLD DID NOT HAVE  . Tobacco abuse   . Uterine fibroid    s/p hysterectomy   Review of Systems:  Review of Systems  Constitutional: Negative for chills and fever.  Eyes: Negative for blurred vision, double vision and photophobia.  Respiratory: Negative for shortness of breath and wheezing.   Cardiovascular: Negative for chest pain, palpitations, claudication and leg swelling.  Gastrointestinal: Negative for abdominal pain, constipation, diarrhea, nausea and vomiting.  Genitourinary: Positive for frequency. Negative for dysuria, hematuria and urgency.  Musculoskeletal: Negative for back pain, falls and joint pain.  Neurological: Positive for sensory change. Negative for dizziness, tingling, focal weakness, weakness and headaches.     Physical Exam:  Vitals:   02/12/19 1420  BP: (!) 143/76  Pulse: (!) 102  Temp: 98.9 F (37.2 C)  TempSrc: Oral  SpO2: 98%  Weight: 256 lb 6.4 oz (116.3 kg)  Height: 5\' 4"  (1.626 m)   Physical Exam Constitutional:      General: She is not in acute distress.    Appearance: Normal appearance. She is  obese.  Neck:     Musculoskeletal: Normal range of motion.  Cardiovascular:     Rate and Rhythm: Normal rate and regular rhythm.     Pulses: Normal pulses.     Heart sounds: Normal heart sounds. No murmur. No friction rub. No gallop.   Pulmonary:     Effort: Pulmonary effort is normal. No respiratory distress.     Breath sounds: Normal breath sounds. No wheezing, rhonchi or rales.  Abdominal:     General: Bowel sounds are normal. There is no distension.     Palpations: Abdomen is soft.     Tenderness: There is no abdominal tenderness.  Musculoskeletal: Normal range of motion.  Skin:    General: Skin is warm and dry.  Neurological:     General: No focal deficit present.     Mental Status: She is alert and oriented to person, place, and time. Mental status is at baseline.     Sensory: No sensory deficit.     Assessment & Plan:   See Encounters Tab for problem based charting.  Patient seen with Dr. Philipp Ovens

## 2019-02-12 NOTE — Patient Instructions (Addendum)
Foods friendly for diabetic diet:  Protein such as lean meats and fish  Leafy green vegetables and brocoli  Eggs Yogurt Nuts    Ms. Szafran,  Please continue to follow your insulin regimen. We have placed a referral to endocrinology for you. Please schedule an appointment with them. Above are some of the foods that are compatible with a diabetic diet. Please incorporate these foods into your diet and continue to avoid carbonated beverages and sugars.   We did some lab work today and gave you the flu shot. We will inform you of the results.   Thank you!

## 2019-02-12 NOTE — Assessment & Plan Note (Addendum)
BP today 143/76. Patient reports compliance with irbesartan and HCTZ and requesting refills on this.She denies any headaches, chest pain or shortness of breath.   - Refilled medications

## 2019-02-12 NOTE — Assessment & Plan Note (Signed)
Patient reports her opiates are managed at [facility].  - Continue to monitor

## 2019-02-12 NOTE — Assessment & Plan Note (Signed)
Patient takes acyclovir and valacyclovir intermittently during herpes simplex flares. She reports last flare to be approximately 3 weeks ago on her lower left back during which she used acyclovir.   - Continue to monitor

## 2019-02-12 NOTE — Assessment & Plan Note (Signed)
Patient is on protonix. She denies any abdominal pain, nausea or vomiting at this time and reports symptoms are well controlled.  - Continue protonix

## 2019-02-13 LAB — BMP8+ANION GAP
Anion Gap: 23 mmol/L — ABNORMAL HIGH (ref 10.0–18.0)
BUN/Creatinine Ratio: 11 (ref 9–23)
BUN: 9 mg/dL (ref 6–24)
CO2: 24 mmol/L (ref 20–29)
Calcium: 11 mg/dL — ABNORMAL HIGH (ref 8.7–10.2)
Chloride: 92 mmol/L — ABNORMAL LOW (ref 96–106)
Creatinine, Ser: 0.83 mg/dL (ref 0.57–1.00)
GFR calc Af Amer: 90 mL/min/{1.73_m2} (ref >59)
GFR calc non Af Amer: 78 mL/min/{1.73_m2} (ref >59)
Glucose: 272 mg/dL — ABNORMAL HIGH (ref 65–99)
Potassium: 4 mmol/L (ref 3.5–5.2)
Sodium: 139 mmol/L (ref 134–144)

## 2019-02-13 LAB — URINALYSIS, ROUTINE W REFLEX MICROSCOPIC
Bilirubin, UA: NEGATIVE
Ketones, UA: NEGATIVE
Nitrite, UA: POSITIVE — AB
Protein,UA: NEGATIVE
RBC, UA: NEGATIVE
Specific Gravity, UA: >=1.03 — AB (ref 1.005–1.030)
Urobilinogen, Ur: 0.2 mg/dL (ref 0.2–1.0)
pH, UA: 5 (ref 5.0–7.5)

## 2019-02-13 LAB — LIPID PANEL
Chol/HDL Ratio: 7.6 ratio — ABNORMAL HIGH (ref 0.0–4.4)
Cholesterol, Total: 283 mg/dL — ABNORMAL HIGH (ref 100–199)
HDL: 37 mg/dL — ABNORMAL LOW (ref >39)
LDL Chol Calc (NIH): 183 mg/dL — ABNORMAL HIGH (ref 0–99)
Triglycerides: 319 mg/dL — ABNORMAL HIGH (ref 0–149)
VLDL Cholesterol Cal: 63 mg/dL — ABNORMAL HIGH (ref 5–40)

## 2019-02-13 LAB — MICROSCOPIC EXAMINATION
Casts: NONE SEEN /lpf
Epithelial Cells (non renal): >10 /hpf — AB (ref 0–10)
WBC, UA: >30 /hpf — AB (ref 0–5)

## 2019-02-13 LAB — MICROALBUMIN / CREATININE URINE RATIO
Creatinine, Urine: 82.5 mg/dL
Microalb/Creat Ratio: 15 mg/g creat (ref 0–29)
Microalbumin, Urine: 12.5 ug/mL

## 2019-02-15 DIAGNOSIS — K760 Fatty (change of) liver, not elsewhere classified: Secondary | ICD-10-CM | POA: Diagnosis not present

## 2019-02-15 DIAGNOSIS — Z1159 Encounter for screening for other viral diseases: Secondary | ICD-10-CM | POA: Diagnosis not present

## 2019-02-15 DIAGNOSIS — R1013 Epigastric pain: Secondary | ICD-10-CM | POA: Diagnosis not present

## 2019-02-15 DIAGNOSIS — R945 Abnormal results of liver function studies: Secondary | ICD-10-CM | POA: Diagnosis not present

## 2019-02-19 NOTE — Addendum Note (Signed)
Addended by: Jodean Lima on: 02/19/2019 03:27 PM   Modules accepted: Level of Service

## 2019-02-19 NOTE — Assessment & Plan Note (Signed)
Patient follows at pain clinic at [facility] and reports that pain is well controlled with current regimen of percocet.   - Continue pain management per pain clinic

## 2019-02-19 NOTE — Assessment & Plan Note (Signed)
Patient is taking gabapentin 300mg  bid. She reports continued neuropathy but is well controlled. She does not wish to adjust her regimen at this time and would like to focus on getting better glucose control to decrease symptoms.   - Continue current regimen

## 2019-02-19 NOTE — Progress Notes (Signed)
Internal Medicine Clinic Attending  I saw and evaluated the patient.  I personally confirmed the key portions of the history and exam documented by Dr. Marva Panda and I reviewed pertinent patient test results.  The assessment, diagnosis, and plan were formulated together and I agree with the documentation in the resident's note.   Patient is here today for follow up of diabetes. last Hgb A1c two months ago was 9.8 which has been essentially unchanged for the past year. CBG today is 294. Review of glucometer readings show persistently elevated CBG > 200, some readings > 300. Patient is taking metformin 1,000 mg BID, ozempic 0.5 mg qWeekly, and VGO insulin pump, on maximum 40 units of long acting insulin.  She is using ~ 20 / 36 units of her short acting on demand insulin (4 clicks TID AC and 2-3 clicks TID with snacks). She has been meeting regularly with our clinic diabetic dietician and expresses frustration with her lack of progress. She is requesting a referral to endocrinology which is reasonable given her difficult to control DM on multiple injection medications. She is agreeable to increasing her short acting insulin today (5 clicks TID AC and 3-4 clicks with snacks). Follow up 2 weeks with her glucometer. Endocrinology referral placed.

## 2019-02-19 NOTE — Assessment & Plan Note (Addendum)
Hb A1c 9.7 in July 2020. [name] expresses frustration with poor glycemic control. She reports that she has been working with But[facility] Penny and reports that she has had some success but would like referral to a "diabetes specialist" as she believes that she is not making any progress. Discussed her dietary habits and patient reports that she is eating what she believes is healthy and snacking in between but reports that she is only aware of things she cannot eat and it would help for her to know better of the foods that she is able to eat and choose from. We discussed incorporating fish and lean meats into her diet with the addition of leafy green vegetables, yogurt, eggs and nuts. Patient expressed understanding.  We also discussed her insulin regimen. She is currently taking Ozempic 0.5mg  weekly, Metformin 1000mg  bid, and VGo with 4 clicks tid with meals and 2-3 clicks tid with snacks. She continues to experience glucose readings >250mg /dl. Patient's Ozempic was increased to 1mg  weekly on last visit; however, due to worsening vision, patient's Ozempic decreased back to 0.5mg  weekly. Her weight has been stable. She reports decrease in physical activity.  We discussed increase in increasing clicks to 5 clicks tid with meals and 3-4 clicks with snacks. Patient agreeable to this.  Patient was evaluated by ophthalmologist and recommended for tighter glycemic control.   - Referral to endocrinologist for further titration of insulin regimen - Continue metformin, Ozempic 0.5mg  weekly, and 0000000 (5 clicks tid with meals and 3-4 clicks with snacks). - Return to clinic in 1-2 weeks

## 2019-02-19 NOTE — Assessment & Plan Note (Signed)
Lipid panel at this visit:  Total cholesterol 283, LDL (calculated) 183, HDL 37, Triglycerides 319.  Patient on rosuvastatin 20mg  but reports that she has not been taking this for several months due to the "recall". However, due to the elevated cholesterol and triglycerides and given patient's comorbidities of uncontrolled diabetes mellitus complicated with neuropathy and hypertension, ASCVD risk 32.6% of cardiovascular event in next 69yrs. Patient would benefit from high intensity statin at this point. Called patient with results and discussed restarting statin. Patient to be seen on 9/22 in clinic and will get baseline labs prior to starting high intensity statin.   - Check TSH, CK and liver enzymes at next visit and start patient on atorvastatin 80mg  qd

## 2019-02-20 ENCOUNTER — Telehealth: Payer: Self-pay | Admitting: Internal Medicine

## 2019-02-20 NOTE — Telephone Encounter (Signed)
Discussed lab results with patient. She also reports better control of glucose levels since increasing clicks on VGo. Patient will follow up in clinic on 9/22 to check TSH, CK and liver enzymes prior to starting high intensity statin as she stopped taking her Crestor and will follow up with endocrinologist in October.

## 2019-02-20 NOTE — Telephone Encounter (Signed)
Pt is returning a call pls call [phone]

## 2019-02-22 ENCOUNTER — Encounter: Payer: Self-pay | Admitting: Podiatry

## 2019-02-22 ENCOUNTER — Ambulatory Visit (INDEPENDENT_AMBULATORY_CARE_PROVIDER_SITE_OTHER): Payer: PPO

## 2019-02-22 ENCOUNTER — Other Ambulatory Visit: Payer: Self-pay

## 2019-02-22 ENCOUNTER — Ambulatory Visit (INDEPENDENT_AMBULATORY_CARE_PROVIDER_SITE_OTHER): Payer: PPO | Admitting: Podiatry

## 2019-02-22 DIAGNOSIS — S99191K Other physeal fracture of right metatarsal, subsequent encounter for fracture with nonunion: Secondary | ICD-10-CM

## 2019-02-22 DIAGNOSIS — S99191D Other physeal fracture of right metatarsal, subsequent encounter for fracture with routine healing: Secondary | ICD-10-CM

## 2019-02-22 DIAGNOSIS — E559 Vitamin D deficiency, unspecified: Secondary | ICD-10-CM | POA: Diagnosis not present

## 2019-02-24 NOTE — Progress Notes (Signed)
Subjective: 58 year old female presents the office today for follow-up evaluation of her right foot, Jones fracture.  She still in the cam boot.  She presents today walking in the boot however she states she does have a knee scooter that she uses.  She is going to follow-up with endocrinologist in regards to the uncontrolled diabetes.  Also she did finish her vitamin D.  She is get some occasional discomfort but is intermittent. Denies any systemic complaints such as fevers, chills, nausea, vomiting. No acute changes since last appointment, and no other complaints at this time.   Objective: AAO x3, NAD DP/PT pulses palpable bilaterally, CRT less than 3 seconds Minimal discomfort to palpation on the fifth metatarsal base today.  Mild edema but there is no erythema or warmth.  No other areas of tenderness.  No pain with calf compression, swelling, warmth, erythema  Assessment: Nonunion right Jones fracture  Plan: -All treatment options discussed with the patient including all alternatives, risks, complications.  -X-rays were obtained and reviewed.  There is approximately 4 mm fracture gap in the delayed union. -Again discussed with her surgical intervention.  Given her uncontrolled diabetes, will not G level on has a few surgery at this time.  We will recheck a vitamin D level.  Also she states that she was taking the vitamin D every day as opposed to once a week and when she had a doctor come to her house they noticed this and poison control was called.  She seemed to have no sequela. -Likely order a bone stimulator.  I will see her back next appointment and if still no improvement in nonunion will order a bone stimulator. -Patient encouraged to call the office with any questions, concerns, change in symptoms.   Return in about 10 days (around 03/04/2019).  Trula Slade DPM

## 2019-02-28 ENCOUNTER — Telehealth: Payer: Self-pay | Admitting: Dietician

## 2019-02-28 DIAGNOSIS — E559 Vitamin D deficiency, unspecified: Secondary | ICD-10-CM | POA: Diagnosis not present

## 2019-02-28 DIAGNOSIS — E1140 Type 2 diabetes mellitus with diabetic neuropathy, unspecified: Secondary | ICD-10-CM

## 2019-02-28 NOTE — Telephone Encounter (Signed)
Requests freestyle libre 2 prescriptions. She also reports that with her current diabetes medicines (vgo40 filled with u500 insulin placed every morning- using 0000000 clicks /day for meals which is max) . Metformin, ozempic 0.5 weekly, her blood sugars remain the 200s even when she does not eat. She has been in pain and wonders if the pain is causing her blood sugars to be high. She has an appointment with Dr. Marva Panda on Monday. She agreed to upload her CGM and look at information about insulin pumps online before her appointment.

## 2019-03-01 LAB — COMPLETE METABOLIC PANEL WITH GFR
AG Ratio: 1.4 (calc) (ref 1.0–2.5)
ALT: 46 U/L — ABNORMAL HIGH (ref 6–29)
AST: 42 U/L — ABNORMAL HIGH (ref 10–35)
Albumin: 4.3 g/dL (ref 3.6–5.1)
Alkaline phosphatase (APISO): 75 U/L (ref 37–153)
BUN: 8 mg/dL (ref 7–25)
CO2: 26 mmol/L (ref 20–32)
Calcium: 10.2 mg/dL (ref 8.6–10.4)
Chloride: 96 mmol/L — ABNORMAL LOW (ref 98–110)
Creat: 0.67 mg/dL (ref 0.50–1.05)
GFR, Est African American: 112 mL/min/{1.73_m2} (ref >=60)
GFR, Est Non African American: 97 mL/min/{1.73_m2} (ref >=60)
Globulin: 3.1 g/dL (calc) (ref 1.9–3.7)
Glucose, Bld: 271 mg/dL — ABNORMAL HIGH (ref 65–139)
Potassium: 4 mmol/L (ref 3.5–5.3)
Sodium: 136 mmol/L (ref 135–146)
Total Bilirubin: 0.3 mg/dL (ref 0.2–1.2)
Total Protein: 7.4 g/dL (ref 6.1–8.1)

## 2019-03-01 LAB — VITAMIN D 25 HYDROXY (VIT D DEFICIENCY, FRACTURES): Vit D, 25-Hydroxy: 28 ng/mL — ABNORMAL LOW (ref 30–100)

## 2019-03-04 ENCOUNTER — Ambulatory Visit (INDEPENDENT_AMBULATORY_CARE_PROVIDER_SITE_OTHER): Payer: PPO

## 2019-03-04 ENCOUNTER — Encounter: Payer: Self-pay | Admitting: Podiatry

## 2019-03-04 ENCOUNTER — Ambulatory Visit (INDEPENDENT_AMBULATORY_CARE_PROVIDER_SITE_OTHER): Payer: PPO | Admitting: Podiatry

## 2019-03-04 ENCOUNTER — Other Ambulatory Visit: Payer: Self-pay

## 2019-03-04 DIAGNOSIS — E559 Vitamin D deficiency, unspecified: Secondary | ICD-10-CM

## 2019-03-04 DIAGNOSIS — G8929 Other chronic pain: Secondary | ICD-10-CM

## 2019-03-04 DIAGNOSIS — M545 Low back pain, unspecified: Secondary | ICD-10-CM

## 2019-03-04 DIAGNOSIS — S99191K Other physeal fracture of right metatarsal, subsequent encounter for fracture with nonunion: Secondary | ICD-10-CM

## 2019-03-04 MED ORDER — VITAMIN D (ERGOCALCIFEROL) 1.25 MG (50000 UNIT) PO CAPS: 50000.0000 [IU] | ORAL_CAPSULE | ORAL | 0 refills | Status: DC

## 2019-03-04 NOTE — Progress Notes (Addendum)
Subjective: 58 year old female presents the office today for follow-up evaluation of her right foot, Jones fracture.  States that she still having intermittent discomfort.  She still wearing the cam boot and tries to offload her foot as much as possible.  Swelling is improved but still having pain. Denies any systemic complaints such as fevers, chills, nausea, vomiting. No acute changes since last appointment, and no other complaints at this time.   Objective: AAO x3, NAD DP/PT pulses palpable bilaterally, CRT less than 3 seconds There is still discomfort to palpation on the fifth metatarsal base today.  Mild however improved edema but there is no erythema or warmth.  No other areas of tenderness.  No pain with calf compression, swelling, warmth, erythema  Assessment: Nonunion right Jones fracture; vitamin D deficiency  Plan: -All treatment options discussed with the patient including all alternatives, risks, complications.  -X-rays are to be reviewed. Fifth metatarsal base fracture still evident consistent with a Jones fracture with approximate fracture gap 4 mm.  Consistent with a nonunion. -Blood work reviewed today.  Ordered vitamin D to take weekly and again directed on use. -Remainder as noted.  Isolation.  Bone stimulator has been ordered for her. -Discussed that after pregnancy level as well as A1c improves consider surgical intervention if not healing.   Return in about 4 weeks (around 04/01/2019).  Trula Slade DPM

## 2019-03-05 ENCOUNTER — Encounter: Payer: Self-pay | Admitting: *Deleted

## 2019-03-05 ENCOUNTER — Ambulatory Visit (INDEPENDENT_AMBULATORY_CARE_PROVIDER_SITE_OTHER): Payer: PPO | Admitting: Internal Medicine

## 2019-03-05 ENCOUNTER — Encounter: Payer: Self-pay | Admitting: Internal Medicine

## 2019-03-05 ENCOUNTER — Other Ambulatory Visit: Payer: Self-pay

## 2019-03-05 VITALS — BP 140/76 | HR 80 | Temp 99.3°F | Ht 64.0 in | Wt 257.4 lb

## 2019-03-05 DIAGNOSIS — Z794 Long term (current) use of insulin: Secondary | ICD-10-CM

## 2019-03-05 DIAGNOSIS — E785 Hyperlipidemia, unspecified: Secondary | ICD-10-CM

## 2019-03-05 DIAGNOSIS — I10 Essential (primary) hypertension: Secondary | ICD-10-CM | POA: Diagnosis not present

## 2019-03-05 DIAGNOSIS — Z79899 Other long term (current) drug therapy: Secondary | ICD-10-CM | POA: Diagnosis not present

## 2019-03-05 DIAGNOSIS — E1140 Type 2 diabetes mellitus with diabetic neuropathy, unspecified: Secondary | ICD-10-CM | POA: Diagnosis not present

## 2019-03-05 DIAGNOSIS — R21 Rash and other nonspecific skin eruption: Secondary | ICD-10-CM

## 2019-03-05 MED ORDER — ATORVASTATIN CALCIUM 80 MG PO TABS: 80.0000 mg | ORAL_TABLET | Freq: Every day | ORAL | 3 refills | Status: DC

## 2019-03-05 NOTE — Assessment & Plan Note (Signed)
Most recent lipid panel:  TC 283, LDL calc 183, HDL 37, Triglycerides 319 (nonfasting). ASCVD risk 33% of cardiovascular event in 10 years.  Baseline TSH, CK, and Liver enzymes checked and patient started on atorvastatin 80mg  qd at this visit.   - Atorvastatin 80mg  qd

## 2019-03-05 NOTE — Progress Notes (Signed)
CC: diabetes f/u   HPI:  [name] is a 58 y.o. female with PMHx as listed below. Patient is here today for follow up of diabetes management. She has no acute concerns today. Please see problem based charting for further assessment and planning.   Past Medical History:  Diagnosis Date  . Allergy   . Arthritis    back   . Asthma    AS CHILD  . Chronic back pain   . Chronic leg pain    due to back pain  . Cocaine abuse ([place])    in remission  . Diabetes ([place])    with neuropathy  . GERD (gastroesophageal reflux disease)   . Hyperlipidemia   . Hypertension   . Neuromuscular disorder ([facility])    neuropathy  . RECTAL BLEEDING 12/09/2008   Annotation: s/p EGD 7/08 mild gastritis, s/p colonoscopy 7/08- benign polyp  s/p polypectomy and isolated diverticulum.  Qualifier: Diagnosis of  By: Ditzler RN, Debra    . Sleep apnea    CPAP    YEARS AGO DONE 1/2 YEARS AGO AND WAS TOLD DID NOT HAVE  . Tobacco abuse   . Uterine fibroid    s/p hysterectomy   Review of Systems:   Review of Systems  Constitutional: Negative for chills, fever and malaise/fatigue.  Eyes: Negative for blurred vision, double vision and photophobia.  Respiratory: Negative for cough, sputum production and shortness of breath.   Cardiovascular: Negative for chest pain, palpitations and claudication.  Gastrointestinal: Negative for abdominal pain, constipation, diarrhea, nausea and vomiting.  Genitourinary: Negative for dysuria, frequency and urgency.  Musculoskeletal: Negative for falls and myalgias.  Skin: Positive for rash. Negative for itching.  Neurological: Negative for dizziness, tingling, sensory change, focal weakness and headaches.     Physical Exam:  Vitals:   03/05/19 1340  BP: 140/76  Pulse: 80  Temp: 99.3 F (37.4 C)  TempSrc: Oral  SpO2: 94%  Weight: 257 lb 6.4 oz (116.8 kg)  Height: 5\' 4"  (1.626 m)   Physical Exam Constitutional:      General: She is not in acute distress.  Appearance: Normal appearance. She is obese. She is not ill-appearing.  Eyes:     General: No scleral icterus.    Extraocular Movements: Extraocular movements intact.     Conjunctiva/sclera: Conjunctivae normal.     Pupils: Pupils are equal, round, and reactive to light.  Cardiovascular:     Rate and Rhythm: Normal rate and regular rhythm.     Pulses: Normal pulses.     Heart sounds: Normal heart sounds. No murmur. No friction rub. No gallop.   Pulmonary:     Effort: Pulmonary effort is normal. No respiratory distress.     Breath sounds: Normal breath sounds. No wheezing.  Abdominal:     General: Bowel sounds are normal. There is no distension.     Palpations: Abdomen is soft.     Tenderness: There is no abdominal tenderness.  Musculoskeletal: Normal range of motion.        General: No swelling or tenderness.  Skin:    General: Skin is warm and dry.  Neurological:     General: No focal deficit present.     Mental Status: She is alert and oriented to person, place, and time.     Cranial Nerves: No cranial nerve deficit.     Sensory: No sensory deficit.     Motor: No weakness.      Assessment & Plan:   See  Encounters Tab for problem based charting.  Patient seen with Dr. Lynnae January

## 2019-03-05 NOTE — Assessment & Plan Note (Signed)
BP 140/76. Patient continued on irbesartan and HCTZ without any side effects. She denies any chest pain, shortness of breath, headache or dizziness.  - Continue irbesartan 150mg  and HCTZ 25mg  qd - BP check in 2-3 months

## 2019-03-05 NOTE — Patient Instructions (Addendum)
[name], [name] were seen in the clinic today for follow up of diabetes and high cholesterol.   For your diabetes, continue your current medication regimen. Please schedule to see Nadra in the near future as diet will be key to further management and also recommendations for apps that can help you titrate how much insulin you are using.  For your cholesterol, please start taking Atorvastatin 80mg  daily. Please contact us if you have any concerns.   Please contact us if you have any concerns.   Thank you!

## 2019-03-05 NOTE — Assessment & Plan Note (Addendum)
[name] is here for follow up of her diabetes management today. Patient has been taking Metformin 1000mg  bid, Ozempic 0.5mg  weekly and 0000000 with 5 clicks tid with meals and 3-4 clicks with snacks (2x/day). She reports compliance with her regimen and BGL has been ranging 47-325 with median of ~220. Patient had two hypoglycemic events including one yesterday while she was sleeping. She reports that she woke up, felt dizzy and checked her BGL and it was 47. She drank some juice resulting in improvement. Given the variability in her BGL, patient would benefit from further control of her diet. Patient recommended for closer follow up with Debera Lat for further diabetes nutrition education. Patient also advised to use an app to track how much insulin she requires based on her preprandial readings to avoid hypoglycemic episodes. Patient expresses understanding and is willing to work further with Butch Penny.   - Continue current insulin regimen  - Endocrinologist appointment on 03/15/2019 - Follow up with Butch Penny for diabetes counseling     ADDENDUM: Patient requested prescription for [facility] 2 for CGM. She currently has [employer].  Per recent endocrinology note, although patient is still checking her BGL 4x/day, she is only injecting insulin twice a day without any corrections for BGL. Based on this, she does not currently qualify for a Libre 2.  Patient discussed with Debera Lat.

## 2019-03-06 DIAGNOSIS — Z79899 Other long term (current) drug therapy: Secondary | ICD-10-CM | POA: Diagnosis not present

## 2019-03-06 DIAGNOSIS — G4733 Obstructive sleep apnea (adult) (pediatric): Secondary | ICD-10-CM | POA: Diagnosis not present

## 2019-03-06 LAB — TSH: TSH: 1.1 u[IU]/mL (ref 0.450–4.500)

## 2019-03-06 LAB — HEPATIC FUNCTION PANEL
ALT: 51 IU/L — ABNORMAL HIGH (ref 0–32)
AST: 48 IU/L — ABNORMAL HIGH (ref 0–40)
Albumin: 4.3 g/dL (ref 3.8–4.9)
Alkaline Phosphatase: 81 IU/L (ref 39–117)
Bilirubin Total: 0.2 mg/dL (ref 0.0–1.2)
Bilirubin, Direct: <0.02 mg/dL (ref 0.00–0.40)
Total Protein: 7.3 g/dL (ref 6.0–8.5)

## 2019-03-06 LAB — CK: Total CK: 243 U/L — ABNORMAL HIGH (ref 32–182)

## 2019-03-06 NOTE — Progress Notes (Signed)
Internal Medicine Clinic Attending  I saw and evaluated the patient.  I personally confirmed the key portions of the history and exam documented by Dr. Aslam and I reviewed pertinent patient test results.  The assessment, diagnosis, and plan were formulated together and I agree with the documentation in the resident's note.     

## 2019-03-07 NOTE — Addendum Note (Signed)
Addended by: Harvie Heck on: 03/07/2019 02:16 PM   Modules accepted: Level of Service

## 2019-03-13 ENCOUNTER — Other Ambulatory Visit: Payer: Self-pay

## 2019-03-13 DIAGNOSIS — E8801 Alpha-1-antitrypsin deficiency: Secondary | ICD-10-CM | POA: Diagnosis not present

## 2019-03-13 DIAGNOSIS — K760 Fatty (change of) liver, not elsewhere classified: Secondary | ICD-10-CM | POA: Diagnosis not present

## 2019-03-13 DIAGNOSIS — G4733 Obstructive sleep apnea (adult) (pediatric): Secondary | ICD-10-CM | POA: Diagnosis not present

## 2019-03-14 MED ORDER — FREESTYLE LIBRE 2 SENSOR SYSTM MISC: 1.0000 | Freq: Four times a day (QID) | 12 refills | Status: DC

## 2019-03-14 MED ORDER — FREESTYLE LIBRE 2 READER SYSTM DEVI: 1.0000 | Freq: Four times a day (QID) | 0 refills | Status: DC

## 2019-03-14 NOTE — Telephone Encounter (Signed)
Prescription for new freestyle libre 2 signed.

## 2019-03-15 ENCOUNTER — Encounter: Payer: Self-pay | Admitting: Internal Medicine

## 2019-03-15 ENCOUNTER — Ambulatory Visit (INDEPENDENT_AMBULATORY_CARE_PROVIDER_SITE_OTHER): Payer: PPO | Admitting: Internal Medicine

## 2019-03-15 ENCOUNTER — Other Ambulatory Visit: Payer: Self-pay

## 2019-03-15 VITALS — BP 124/68 | HR 81 | Temp 98.7°F | Ht 64.0 in | Wt 253.4 lb

## 2019-03-15 DIAGNOSIS — E1165 Type 2 diabetes mellitus with hyperglycemia: Secondary | ICD-10-CM

## 2019-03-15 DIAGNOSIS — E1140 Type 2 diabetes mellitus with diabetic neuropathy, unspecified: Secondary | ICD-10-CM

## 2019-03-15 DIAGNOSIS — Z794 Long term (current) use of insulin: Secondary | ICD-10-CM

## 2019-03-15 MED ORDER — NOVOLOG MIX 70/30 FLEXPEN (70-30) 100 UNIT/ML ~~LOC~~ SUPN: 36.0000 [IU] | PEN_INJECTOR | Freq: Two times a day (BID) | SUBCUTANEOUS | 11 refills | Status: DC

## 2019-03-15 NOTE — Patient Instructions (Addendum)
-   STOP V-Go  - Stop Humulin U-500 - Continue Metformin 1 tablet Twice daily  - Continue Ozempic 0.5 mg weekly  - Start Novolog Mix (70/30) 36 units before breakfast and Supper     Choose healthy, lower carb lower calorie snacks: toss salad, cooked vegetables, cottage cheese, peanut butter, low fat cheese / string cheese, lower sodium deli meat, tuna salad or chicken salad     HOW TO TREAT LOW BLOOD SUGARS (Blood sugar LESS THAN 70 MG/DL)  Please follow the RULE OF 15 for the treatment of hypoglycemia treatment (when your (blood sugars are less than 70 mg/dL)    STEP 1: Take 15 grams of carbohydrates when your blood sugar is low, which includes:   3-4 GLUCOSE TABS  OR  3-4 OZ OF JUICE OR REGULAR SODA OR  ONE TUBE OF GLUCOSE GEL     STEP 2: RECHECK blood sugar in 15 MINUTES STEP 3: If your blood sugar is still low at the 15 minute recheck --> then, go back to STEP 1 and treat AGAIN with another 15 grams of carbohydrates.

## 2019-03-15 NOTE — Progress Notes (Signed)
Name: [name]  MRN/ DOB: [id number], 11-20-1960   Age/ Sex: 58 y.o., female    PCP: Harvie Heck, MD   Reason for Endocrinology Evaluation: Type 2 Diabetes Mellitus     Date of Initial Endocrinology Visit: 03/18/2019     PATIENT IDENTIFIER: [name] is a 58 y.o. female with a past medical history of T2DM, HTN, OSA and dyslipidemia. The patient presented for initial endocrinology clinic visit on 03/18/2019 for consultative assistance with her diabetes management.    HPI: [name] was    Diagnosed with DM years ago Prior Medications tried/Intolerance: Soliqua, Glipizide  Started V-Go 1  Yr ago, initially was on V-Go 30 and now on 70. Currently checking blood sugars  Multiple times a day through Freestyle  Hypoglycemia episodes : Yes              Symptoms: shaky                  Frequency: rarely  Hemoglobin A1c has ranged from 8.1% in 2016, peaking at 9.8% in 2020. Patient required assistance for hypoglycemia: no Patient has required hospitalization within the last 1 year from hyper or hypoglycemia: no   In terms of diet, the patient eats 2 meals, snacks 1-2 a day . The patient drinks sugar-sweetened beverages    HOME DIABETES REGIMEN: V-Go 4 clicks with meals , 2 clicks with snack  Humulin U-500  Metformin 1000 mg BID  Ozempic 0. 5 mg weekly started- 02/2019   Statin: yes ACE-I/ARB: No Prior Diabetic Education: Yes    CONTINUOUS GLUCOSE MONITORING RECORD INTERPRETATION    Dates of Recording: 9/19-10/2/20  Sensor description:Freestyle libre  Results statistics:   CGM use % of time 70  Average and SD 180/33.3  Time in range      40  %  % Time Above 180 43  % Time above 250 12  % Time Below target 4    Glycemic patterns summary: Pt with hypoglycemia in the morning and at times following a meal   Hyperglycemic episodes  With over correction and post-prandial   Hypoglycemic episodes occurred fasting and post-prandial   Overnight periods: trending  down    DIABETIC COMPLICATIONS: Microvascular complications:   Neuropathy   Denies: CKD, retinopathy   Last eye exam: Completed 2020  Macrovascular complications:    Denies: CAD, PVD, CVA   PAST HISTORY: Past Medical History:  Past Medical History:  Diagnosis Date   Allergy    Arthritis    back    Asthma    AS CHILD   Chronic back pain    Chronic leg pain    due to back pain   Cocaine abuse ([place])    in remission   Diabetes ([place])    with neuropathy   GERD (gastroesophageal reflux disease)    Hyperlipidemia    Hypertension    Neuromuscular disorder ([place])    neuropathy   RECTAL BLEEDING 12/09/2008   Annotation: s/p EGD 7/08 mild gastritis, s/p colonoscopy 7/08- benign polyp  s/p polypectomy and isolated diverticulum.  Qualifier: Diagnosis of  By: Ditzler RN, Debra     Sleep apnea    CPAP    YEARS AGO DONE 1/2 YEARS AGO AND WAS TOLD DID NOT HAVE   Tobacco abuse    Uterine fibroid    s/p hysterectomy   Past Surgical History:  Past Surgical History:  Procedure Laterality Date   BACK SURGERY  2012   L5-S1 microendoscopic disectomy last  surgery 06/2011   BREAST BIOPSY     LEFT    01/19/16   BREAST REDUCTION SURGERY  1982   COLONOSCOPY     HAND SURGERY     MIDDLE TRIGGER FINGER RIGHT SIDE   RADIOACTIVE SEED GUIDED EXCISIONAL BREAST BIOPSY Left 02/18/2016   Procedure: LEFT RADIOACTIVE SEED GUIDED EXCISIONAL BREAST BIOPSY;  Surgeon: Alphonsa Overall, MD;  Location: [place];  Service: General;  Laterality: Left;   TONSILLECTOMY  2008   TOTAL ABDOMINAL HYSTERECTOMY  05/24/2007   hysterectomy   TUBAL LIGATION     UPPER GASTROINTESTINAL ENDOSCOPY        Social History:  reports that she quit smoking about 22 years ago. Her smoking use included cigarettes. She has a 10.00 pack-year smoking history. She has never used smokeless tobacco. She reports that she does not drink alcohol or use drugs. Family History:  Family History  Problem Relation Age of  Onset   Diabetes Father    Heart disease Father    Hypertension Father    Diabetes Mother    Cancer Mother        brain   Hypertension Mother    Kidney disease Sister    Diabetes Sister    Kidney cancer Sister    Colon cancer Neg Hx    Colon polyps Neg Hx    Esophageal cancer Neg Hx    Rectal cancer Neg Hx    Stomach cancer Neg Hx      HOME MEDICATIONS: Allergies as of 03/15/2019      Reactions   Lisinopril Cough      Medication List       Accurate as of March 15, 2019 11:59 PM. If you have any questions, ask your nurse or doctor.        STOP taking these medications   insulin regular human CONCENTRATED 500 UNIT/ML injection Commonly known as: HUMULIN R Stopped by: Dorita Sciara, MD   [name] 100-33 UNT-MCG/ML Sopn Generic drug: Insulin Glargine-Lixisenatide Stopped by: Dorita Sciara, MD   V-Go 40 Kit Stopped by: Dorita Sciara, MD     TAKE these medications   acyclovir 400 MG tablet Commonly known as: ZOVIRAX Take 1 tablet (400 mg total) by mouth 3 (three) times daily as needed (flares).   aspirin 81 MG EC tablet Take 81 mg by mouth daily.   atorvastatin 80 MG tablet Commonly known as: Lipitor Take 1 tablet (80 mg total) by mouth daily.   budesonide-formoterol 80-4.5 MCG/ACT inhaler Commonly known as: Symbicort Inhale 2 puffs into the lungs 2 (two) times daily.   chlorpheniramine 4 MG tablet Commonly known as: CHLOR-TRIMETON Take 4 mg by mouth every 4 (four) hours as needed for allergies.   Continuous Glucose Monitor Devi Use as directed.   FreeStyle [place] Pocket 2 Reader [employer] 1 each by Does not apply route 4 (four) times daily.   FreeStyle Libre 2 Sensor Systm Misc 1 each by Does not apply route 4 (four) times daily.   gabapentin 300 MG capsule Commonly known as: NEURONTIN Take 1 capsule (300 mg total) by mouth 2 (two) times daily.   GARLIC 5498 PO Take by mouth.   hydrochlorothiazide 25 MG  tablet Commonly known as: HYDRODIURIL Take 1 tablet (25 mg total) by mouth daily.   Insulin Pen Needle 31G X 5 MM Misc Commonly known as: B-D UF III MINI PEN NEEDLES Use to inject insulin twice a day   INSULIN SYRINGE 1CC/30GX1/2" 30G X 1/2" 1 ML  Misc Use to fill Vgo daily   irbesartan 150 MG tablet Commonly known as: Avapro Take 2 tablets (300 mg total) by mouth daily.   metFORMIN 1000 MG tablet Commonly known as: GLUCOPHAGE TAKE 1 TABLET BY MOUTH TWICE DAILY WITH A MEAL   montelukast 10 MG tablet Commonly known as: SINGULAIR Take 1 tablet (10 mg total) by mouth at bedtime.   multivitamin with minerals Tabs tablet Take 1 tablet by mouth daily.   Narcan 4 MG/0.1ML Liqd nasal spray kit Generic drug: naloxone USE 1 SPRAY IN THE NOSE AS NEEDED   NovoLOG Mix 70/30 FlexPen (70-30) 100 UNIT/ML FlexPen Generic drug: insulin aspart protamine - aspart Inject 0.36 mLs (36 Units total) into the skin 2 (two) times daily with a meal. Started by: Dorita Sciara, MD   OneTouch Delica Lancets Fine Misc Check blood sugar 3 times a day   OneTouch Verio Flex System w/Device Kit 1 each by Does not apply route 3 (three) times daily.   OneTouch Verio test strip Generic drug: glucose blood CHECK BLOOD SUGAR 3 TIMES A DAY   oxyCODONE-acetaminophen 5-325 MG tablet Commonly known as: Percocet Take 1 tablet by mouth every 8 (eight) hours as needed for severe pain.   Ozempic (0.25 or 0.5 MG/DOSE) 2 MG/1.5ML Sopn Generic drug: Semaglutide(0.25 or 0.5MG/DOS) Inject 0.5 mg into the skin once a week.   pantoprazole 40 MG tablet Commonly known as: PROTONIX Take 1 tablet (40 mg total) by mouth 2 (two) times daily.   QC TUMERIC COMPLEX PO Take by mouth.   rosuvastatin 20 MG tablet Commonly known as: Crestor Take 1 tablet (20 mg total) by mouth at bedtime.   valACYclovir 500 MG tablet Commonly known as: VALTREX TAKE 1 TABLET BY MOUTH TWICE DAILY FOR 5 DAYS AT TIME OF OUTBREAK AND  1 EVERY DAY FOR SUPPRESSION   vitamin C 100 MG tablet Take 100 mg by mouth daily.   Vitamin D (Ergocalciferol) 1.25 MG (50000 UT) Caps capsule Commonly known as: DRISDOL Take 1 capsule (50,000 Units total) by mouth every 7 (seven) days.        ALLERGIES: Allergies  Allergen Reactions   Lisinopril Cough     REVIEW OF SYSTEMS: A comprehensive ROS was conducted with the patient and is negative except as per HPI and below:  Review of Systems  Constitutional: Negative for chills and fever.  HENT: Negative for congestion and sore throat.   Eyes: Negative for blurred vision and pain.  Respiratory: Negative for cough and shortness of breath.   Cardiovascular: Negative for chest pain and palpitations.  Gastrointestinal: Negative for abdominal pain and diarrhea.  Genitourinary: Negative for frequency.  Neurological: Positive for tingling. Negative for tremors.  Endo/Heme/Allergies: Negative for polydipsia.  Psychiatric/Behavioral: Negative for depression. The patient is not nervous/anxious.       OBJECTIVE:   VITAL SIGNS: BP 124/68 (BP Location: Left Arm, Patient Position: Sitting, Cuff Size: Large)    Pulse 81    Temp 98.7 F (37.1 C)    Ht _0  (1.626 m)    Wt 253 lb 6.4 oz (114.9 kg)    SpO2 90%    BMI 43.50 kg/m    PHYSICAL EXAM:  General: Pt appears well and is in NAD  Hydration: Well-hydrated with moist mucous membranes and good skin turgor  HEENT: Head: Unremarkable. Oropharynx clear without exudate.  Eyes: External eye exam normal without stare, lid lag or exophthalmos.  EOM intact.   Neck: General: Supple without adenopathy or  carotid bruits. Thyroid: Thyroid size normal.  No goiter or nodules appreciated. No thyroid bruit.  Lungs: Clear with good BS bilat with no rales, rhonchi, or wheezes  Heart: RRR with normal S1 and S2 and no gallops; no murmurs; no rub  Abdomen: Normoactive bowel sounds, soft, nontender, without masses or organomegaly palpable  Extremities:   Lower extremities - No pretibial edema.Right foot boot  Skin: Normal texture and temperature to palpation. No rash noted. No Acanthosis nigricans/skin tags. No lipohypertrophy.  Neuro: MS is good with appropriate affect, pt is alert and Ox3    DM foot exam: deferred   DATA REVIEWED:  Lab Results  Component Value Date   HGBA1C 9.7 (H) 01/11/2019   HGBA1C 9.8 (A) 12/10/2018   HGBA1C 9.7 (A) 08/21/2018   Lab Results  Component Value Date   MICROALBUR 0.99 09/30/2009   LDLCALC 183 (H) 02/12/2019   CREATININE 0.67 02/28/2019   Lab Results  Component Value Date   MICRALBCREAT 4.7 05/19/2015    Lab Results  Component Value Date   CHOL 283 (H) 02/12/2019   HDL 37 (L) 02/12/2019   LDLCALC 183 (H) 02/12/2019   TRIG 319 (H) 02/12/2019   CHOLHDL 7.6 (H) 02/12/2019        ASSESSMENT / PLAN / RECOMMENDATIONS:   1) Type 2 Diabetes Mellitus, Poorly controlled, With Neuropathy complications - Most recent A1c of 9.7 %. Goal A1c < 7.0 %.     -I have discussed with the patient the pathophysiology of diabetes. We went over the natural progression of the disease. We talked about both insulin resistance and insulin deficiency. We stressed the importance of lifestyle changes including diet and exercise. I explained the complications associated with diabetes including retinopathy, nephropathy, neuropathy as well as increased risk of cardiovascular disease. We went over the benefit seen with glycemic control.   - I explained to the patient that diabetic patients are at higher than normal risk for amputations.  - She is currently on U-500 through V-GO 40 and despite her elevated BG, she has recurrent hypoglycemia sometimes in the early morning hours and at times its during the day, this indicates that a basal rate of 40 is too much for her. Unfortunately with  V-Go pumps, there's no flexibility in dose adjustment, hence will have to stop it and switch her to injections.   - V-Go pumps are  appropriate for pts with a stable insulin requirement but at this time she is a moving target.  - I am also not sure about her original requirement prior to switching her to U-500 . I suggested to her that we started from scratch and decide if she needs the U-500 and then decide on the dosing,pt is in agreement of this.    MEDICATIONS: - STOP V-Go  - Stop Humulin U-500 - Continue Metformin 1 tablet Twice daily  - Continue Ozempic 0.5 mg weekly  - Start Novolog Mix (70/30) 36 units before breakfast and Supper    EDUCATION / INSTRUCTIONS:  BG monitoring instructions: Patient is instructed to check her blood sugars 3-4 times a day, before meals .  Call [facility] if: BG persistently < 70 or > 300.  I reviewed the Rule of 15 for the treatment of hypoglycemia in detail with the patient. Literature supplied.   2) Diabetic complications:   Eye: Does not have known diabetic retinopathy.   Neuro/ Feet: Does have known diabetic peripheral neuropathy.  Renal: Patient does not have known baseline CKD. She  is on an ACEI/ARB at present.   3) Lipids: Patient is is on a statin.    4) Hypertension: She is at goal of < 140/90 mmHg.   F/u in 8 weeks    Signed electronically by: Mack Guise, MD  [facility]  [address] [place], [place] [address] Phone: [phone] FAX: [phone]   CC: Harvie Heck, MD [address] [place] [address] Phone: [phone]  Fax: [phone]    Return to Endocrinology clinic as below: Future Appointments  Date Time Provider [place]  03/25/2019  1:45 PM Plyler, Christalyn Goertz, RD [facility]  04/05/2019 10:15 AM Trula Slade, DPM [facility]  05/14/2019  3:15 PM Harvie Heck, MD [facility]  05/17/2019  8:30 AM Jaqulyn Chancellor, Melanie Crazier, MD [facility] None  05/29/2019 10:00 AM Deneise Lever, MD [facility] None

## 2019-03-18 ENCOUNTER — Encounter: Payer: Self-pay | Admitting: Internal Medicine

## 2019-03-18 DIAGNOSIS — E1165 Type 2 diabetes mellitus with hyperglycemia: Secondary | ICD-10-CM | POA: Insufficient documentation

## 2019-03-19 ENCOUNTER — Telehealth: Payer: Self-pay | Admitting: Dietician

## 2019-03-19 NOTE — Telephone Encounter (Signed)
Called [facility] about faxed prescription for [facility].

## 2019-03-25 ENCOUNTER — Encounter: Payer: PPO | Admitting: Dietician

## 2019-03-25 DIAGNOSIS — S99191K Other physeal fracture of right metatarsal, subsequent encounter for fracture with nonunion: Secondary | ICD-10-CM | POA: Diagnosis not present

## 2019-03-29 DIAGNOSIS — M5136 Other intervertebral disc degeneration, lumbar region: Secondary | ICD-10-CM | POA: Diagnosis not present

## 2019-03-29 DIAGNOSIS — Z23 Encounter for immunization: Secondary | ICD-10-CM | POA: Diagnosis not present

## 2019-03-29 DIAGNOSIS — G8929 Other chronic pain: Secondary | ICD-10-CM | POA: Diagnosis not present

## 2019-03-29 DIAGNOSIS — Z7189 Other specified counseling: Secondary | ICD-10-CM | POA: Diagnosis not present

## 2019-03-29 DIAGNOSIS — Z79899 Other long term (current) drug therapy: Secondary | ICD-10-CM | POA: Diagnosis not present

## 2019-03-29 DIAGNOSIS — M545 Low back pain: Secondary | ICD-10-CM | POA: Diagnosis not present

## 2019-04-04 ENCOUNTER — Telehealth: Payer: Self-pay | Admitting: Internal Medicine

## 2019-04-04 NOTE — Telephone Encounter (Signed)
She will have to increase her dose of NovoLog mix up to 50 units twice a day.  If blood sugars continue to go higher she will need to be started back on the V-go pump and U-500 insulin using the 20 units pump

## 2019-04-04 NOTE — Telephone Encounter (Signed)
Patient called to advise that she stopped taking the [facility] and is taking 36 units of Novolog with her morning and evening meals as instructed at her last viist.  Since then her daily blood sugars have ranged between 250 and 300.  Please advise how to proceed

## 2019-04-05 ENCOUNTER — Ambulatory Visit (INDEPENDENT_AMBULATORY_CARE_PROVIDER_SITE_OTHER): Payer: PPO | Admitting: Podiatry

## 2019-04-05 ENCOUNTER — Other Ambulatory Visit: Payer: Self-pay | Admitting: Dietician

## 2019-04-05 ENCOUNTER — Telehealth: Payer: Self-pay | Admitting: Dietician

## 2019-04-05 ENCOUNTER — Encounter: Payer: Self-pay | Admitting: Podiatry

## 2019-04-05 ENCOUNTER — Other Ambulatory Visit: Payer: Self-pay

## 2019-04-05 ENCOUNTER — Ambulatory Visit (INDEPENDENT_AMBULATORY_CARE_PROVIDER_SITE_OTHER): Payer: PPO

## 2019-04-05 DIAGNOSIS — E1140 Type 2 diabetes mellitus with diabetic neuropathy, unspecified: Secondary | ICD-10-CM

## 2019-04-05 DIAGNOSIS — Z794 Long term (current) use of insulin: Secondary | ICD-10-CM

## 2019-04-05 DIAGNOSIS — E559 Vitamin D deficiency, unspecified: Secondary | ICD-10-CM | POA: Diagnosis not present

## 2019-04-05 DIAGNOSIS — S99191K Other physeal fracture of right metatarsal, subsequent encounter for fracture with nonunion: Secondary | ICD-10-CM | POA: Diagnosis not present

## 2019-04-05 DIAGNOSIS — G4733 Obstructive sleep apnea (adult) (pediatric): Secondary | ICD-10-CM | POA: Diagnosis not present

## 2019-04-05 NOTE — Progress Notes (Signed)
Subjective: 58 year old female presents the office today for follow-up evaluation of her right foot, Jones fracture.  She states that she is still having tenderness.  She still having some tenderness with walking.  She did get the bone stimulator yesterday but she has not been able to use it yet.  She still gets some swelling.  She said her blood sugar soap and running high.  No acute changes since last appointment, and no other complaints at this time.   Objective: AAO x3, NAD DP/PT pulses palpable bilaterally, CRT less than 3 seconds There is still discomfort to palpation on the fifth metatarsal base today.  Mild edema but there is no erythema or warmth.  No other areas of tenderness.  No pain the peroneal tendon no pain with calf compression, swelling, warmth, erythema  Assessment: Nonunion right Jones fracture; vitamin D deficiency and uncontrolled diabetes  Plan: -All treatment options discussed with the patient including all alternatives, risks, complications.  -X-rays are to be reviewed. Fifth metatarsal base fracture still evident consistent with a Jones fracture however there is some increased consolidation compared to x-ray last month. -Remain in cam boot for now.  I did contact the representative for [employer] and Lytle Michaels came by the office today showed her how to use the units. -Recheck vitamin D level. -Based on the results in the computer A1c is improved.  Discussed with any further healing will consider surgical intervention if needed  Return in about 4 weeks (around 05/03/2019). Repeat x-ray  Trula Slade DPM

## 2019-04-05 NOTE — Telephone Encounter (Signed)
Notified patient of message from Dr. Dwyane Dee, she will try 50 units BID and let us know on Monday how her sugars are doing.

## 2019-04-05 NOTE — Telephone Encounter (Signed)
[name] requests refill on Novolog Mix 70/30 insulin to take increased doses.

## 2019-04-05 NOTE — Telephone Encounter (Signed)
Discussed increased insulin Novolog Mix 70/30 dose to 50 units twice daily. She said she is mixing it before she injects it, she takes Ozempic on Sundays, has not missed a dose. She is despondent about her blood sugar control. Offered for her to talk with our behavioral counselor and she denied need. Encouraged her to follow the endocrinologists' recommendations and we can schedule an appointment if she is going to restart the Vgo 20 with u500 to review the procedure and provide a sample. We agreed to talk about her diabetes self care and support next Friday.

## 2019-04-05 NOTE — Telephone Encounter (Signed)
Asa requests more Novolog Mix 70/30 to take increased doses.

## 2019-04-06 ENCOUNTER — Encounter (INDEPENDENT_AMBULATORY_CARE_PROVIDER_SITE_OTHER): Payer: Self-pay

## 2019-04-08 ENCOUNTER — Other Ambulatory Visit: Payer: Self-pay | Admitting: Internal Medicine

## 2019-04-08 ENCOUNTER — Ambulatory Visit: Payer: PPO | Admitting: Dietician

## 2019-04-08 DIAGNOSIS — E1140 Type 2 diabetes mellitus with diabetic neuropathy, unspecified: Secondary | ICD-10-CM

## 2019-04-08 MED ORDER — NOVOLOG MIX 70/30 FLEXPEN (70-30) 100 UNIT/ML ~~LOC~~ SUPN: 50.0000 [IU] | PEN_INJECTOR | Freq: Two times a day (BID) | SUBCUTANEOUS | 11 refills | Status: DC

## 2019-04-10 DIAGNOSIS — E559 Vitamin D deficiency, unspecified: Secondary | ICD-10-CM | POA: Diagnosis not present

## 2019-04-11 LAB — VITAMIN D 25 HYDROXY (VIT D DEFICIENCY, FRACTURES): Vit D, 25-Hydroxy: 39 ng/mL (ref 30–100)

## 2019-04-12 ENCOUNTER — Telehealth: Payer: Self-pay | Admitting: *Deleted

## 2019-04-12 NOTE — Telephone Encounter (Signed)
-----   Message from Trula Slade, DPM sent at 04/11/2019  7:12 AM EDT ----- Val- please let her know that her vitamin d is now normal. Will hold further vitamin D supplementation.

## 2019-04-12 NOTE — Telephone Encounter (Signed)
I informed pt of Dr. Wagoner's review of results. 

## 2019-04-24 ENCOUNTER — Other Ambulatory Visit: Payer: Self-pay | Admitting: *Deleted

## 2019-04-29 MED ORDER — IRBESARTAN 150 MG PO TABS: 300.0000 mg | ORAL_TABLET | Freq: Every day | ORAL | 3 refills | Status: DC

## 2019-05-01 DIAGNOSIS — G8929 Other chronic pain: Secondary | ICD-10-CM | POA: Diagnosis not present

## 2019-05-01 DIAGNOSIS — M5136 Other intervertebral disc degeneration, lumbar region: Secondary | ICD-10-CM | POA: Diagnosis not present

## 2019-05-01 DIAGNOSIS — M545 Low back pain: Secondary | ICD-10-CM | POA: Diagnosis not present

## 2019-05-01 DIAGNOSIS — Z79899 Other long term (current) drug therapy: Secondary | ICD-10-CM | POA: Diagnosis not present

## 2019-05-02 ENCOUNTER — Ambulatory Visit (INDEPENDENT_AMBULATORY_CARE_PROVIDER_SITE_OTHER): Payer: PPO | Admitting: Podiatry

## 2019-05-02 ENCOUNTER — Encounter: Payer: Self-pay | Admitting: Podiatry

## 2019-05-02 ENCOUNTER — Ambulatory Visit (INDEPENDENT_AMBULATORY_CARE_PROVIDER_SITE_OTHER): Payer: PPO

## 2019-05-02 ENCOUNTER — Other Ambulatory Visit: Payer: Self-pay

## 2019-05-02 DIAGNOSIS — E559 Vitamin D deficiency, unspecified: Secondary | ICD-10-CM

## 2019-05-02 DIAGNOSIS — S99191K Other physeal fracture of right metatarsal, subsequent encounter for fracture with nonunion: Secondary | ICD-10-CM

## 2019-05-04 ENCOUNTER — Other Ambulatory Visit: Payer: Self-pay | Admitting: Internal Medicine

## 2019-05-04 DIAGNOSIS — E1140 Type 2 diabetes mellitus with diabetic neuropathy, unspecified: Secondary | ICD-10-CM

## 2019-05-06 ENCOUNTER — Other Ambulatory Visit: Payer: Self-pay | Admitting: Internal Medicine

## 2019-05-06 ENCOUNTER — Telehealth: Payer: Self-pay | Admitting: Internal Medicine

## 2019-05-06 DIAGNOSIS — Z794 Long term (current) use of insulin: Secondary | ICD-10-CM

## 2019-05-06 DIAGNOSIS — E1140 Type 2 diabetes mellitus with diabetic neuropathy, unspecified: Secondary | ICD-10-CM

## 2019-05-06 DIAGNOSIS — G4733 Obstructive sleep apnea (adult) (pediatric): Secondary | ICD-10-CM | POA: Diagnosis not present

## 2019-05-06 NOTE — Telephone Encounter (Signed)
Per pharmacy pt just picked this up 11/17, attempted to call pt back, rang and rang, no answer or vmail

## 2019-05-06 NOTE — Telephone Encounter (Signed)
This has been sent today!

## 2019-05-06 NOTE — Telephone Encounter (Signed)
Refill Request   metFORMIN (GLUCOPHAGE) 1000 MG tablet  [facility] - [place] - [address]

## 2019-05-06 NOTE — Telephone Encounter (Signed)
Refill Request   insulin aspart protamine - aspart (NOVOLOG MIX 70/30 FLEXPEN) (70-30) 100 UNIT/ML FlexPen   [facility] - [place] - [address]

## 2019-05-07 ENCOUNTER — Other Ambulatory Visit: Payer: Self-pay | Admitting: Internal Medicine

## 2019-05-07 DIAGNOSIS — Z1231 Encounter for screening mammogram for malignant neoplasm of breast: Secondary | ICD-10-CM

## 2019-05-07 DIAGNOSIS — S99191K Other physeal fracture of right metatarsal, subsequent encounter for fracture with nonunion: Secondary | ICD-10-CM | POA: Insufficient documentation

## 2019-05-07 NOTE — Progress Notes (Signed)
Subjective: 58 year old female presents the office today for follow-up evaluation of her right foot, Jones fracture.  Overall she states that she is doing better but still having tenderness.  She states the swelling is much improved.  She still wearing the cam boot.  She is been using a bone stimulator daily.  She states that her blood sugars are still running high in the 200s.    Objective: AAO x3, NAD DP/PT pulses palpable bilaterally, CRT less than 3 seconds There is improved but still some mild discomfort to palpation on the fifth metatarsal base.  Improved edema but there is no erythema or warmth.  No other areas of tenderness.  No pain the peroneal tendon no pain with calf compression, swelling, warmth, erythema  Assessment: Nonunion right Jones fracture; vitamin D deficiency and uncontrolled diabetes  Plan: -All treatment options discussed with the patient including all alternatives, risks, complications.  -X-rays are to be reviewed. Fifth metatarsal base fracture still evident consistent with a Jones fracture however there is some mildly increased consolidation. -Continue cam boot for now.  Also continue bone stimulator.  As she starts to feel better and the pain is improved she can try to transition back into a regular shoe. -Vitamin D level improved. -Based on the results in the computer A1c is improved.  Discussed with any further healing will consider surgical intervention if needed  Return in about 4-6 weeks Repeat x-ray  Trula Slade DPM

## 2019-05-14 ENCOUNTER — Ambulatory Visit (INDEPENDENT_AMBULATORY_CARE_PROVIDER_SITE_OTHER): Payer: PPO | Admitting: Internal Medicine

## 2019-05-14 ENCOUNTER — Other Ambulatory Visit: Payer: Self-pay

## 2019-05-14 ENCOUNTER — Encounter: Payer: Self-pay | Admitting: Internal Medicine

## 2019-05-14 VITALS — BP 136/54 | HR 83 | Temp 98.5°F | Ht 64.0 in | Wt 255.5 lb

## 2019-05-14 DIAGNOSIS — R252 Cramp and spasm: Secondary | ICD-10-CM

## 2019-05-14 DIAGNOSIS — E785 Hyperlipidemia, unspecified: Secondary | ICD-10-CM

## 2019-05-14 DIAGNOSIS — G8929 Other chronic pain: Secondary | ICD-10-CM

## 2019-05-14 DIAGNOSIS — M549 Dorsalgia, unspecified: Secondary | ICD-10-CM | POA: Diagnosis not present

## 2019-05-14 DIAGNOSIS — E1142 Type 2 diabetes mellitus with diabetic polyneuropathy: Secondary | ICD-10-CM | POA: Diagnosis not present

## 2019-05-14 DIAGNOSIS — M545 Low back pain, unspecified: Secondary | ICD-10-CM

## 2019-05-14 DIAGNOSIS — E1140 Type 2 diabetes mellitus with diabetic neuropathy, unspecified: Secondary | ICD-10-CM | POA: Diagnosis not present

## 2019-05-14 DIAGNOSIS — I10 Essential (primary) hypertension: Secondary | ICD-10-CM

## 2019-05-14 DIAGNOSIS — E118 Type 2 diabetes mellitus with unspecified complications: Secondary | ICD-10-CM | POA: Diagnosis not present

## 2019-05-14 DIAGNOSIS — R350 Frequency of micturition: Secondary | ICD-10-CM | POA: Diagnosis not present

## 2019-05-14 DIAGNOSIS — A6004 Herpesviral vulvovaginitis: Secondary | ICD-10-CM

## 2019-05-14 DIAGNOSIS — R300 Dysuria: Secondary | ICD-10-CM

## 2019-05-14 DIAGNOSIS — Z794 Long term (current) use of insulin: Secondary | ICD-10-CM

## 2019-05-14 LAB — POCT GLYCOSYLATED HEMOGLOBIN (HGB A1C): Hemoglobin A1C: 10.8 % — AB (ref 4.0–5.6)

## 2019-05-14 LAB — POCT URINALYSIS DIPSTICK
Bilirubin, UA: NEGATIVE
Blood, UA: NEGATIVE
Glucose, UA: POSITIVE — AB
Leukocytes, UA: NEGATIVE
Nitrite, UA: NEGATIVE
Protein, UA: NEGATIVE
Spec Grav, UA: 1.025 (ref 1.010–1.025)
Urobilinogen, UA: 0.2 E.U./dL
pH, UA: 5.5 (ref 5.0–8.0)

## 2019-05-14 LAB — GLUCOSE, CAPILLARY: Glucose-Capillary: 186 mg/dL — ABNORMAL HIGH (ref 70–99)

## 2019-05-14 MED ORDER — VALACYCLOVIR HCL 500 MG PO TABS: ORAL_TABLET | ORAL | 0 refills | Status: DC

## 2019-05-14 MED ORDER — NOVOLOG MIX 70/30 FLEXPEN (70-30) 100 UNIT/ML ~~LOC~~ SUPN: 54.0000 [IU] | PEN_INJECTOR | Freq: Two times a day (BID) | SUBCUTANEOUS | 11 refills | Status: DC

## 2019-05-14 NOTE — Assessment & Plan Note (Signed)
[name] had an office visit with the endocrinologist in October and was taken off her VGo and started on Novolog 70/30 36U bid and was uptitrated to 50U bid. She reports CBG levels still remained elevated to around 300. She became frustrated with her persistent hyperglycemia and switched herself back onto the VGo on 11/28. Since then, she has had two hypoglycemic episodes, one being this morning with CBG of 54 at which time she reports "feeling bad". Her CBG improved to 104 with orange juice and peanut butter. Patient has an appointment with endocrinologist on Friday. Discussed with [name] that she will need to follow up with endocrinology for management of her insulin and that it is unsafe for her to titrate on her own. She expressed understanding and will discontinue her VGO40. At this time, will increase her Novolog 70/30 to 54U bid and she will follow up with Endocrinologist on Friday.

## 2019-05-14 NOTE — Patient Instructions (Signed)
[name],  It was a pleasure seeing you in the clinic today. Today, we discussed your left side pain. Given your urinary symptoms, I suspect a urinary tract infection and will test your urine. If positive, I will let you know and will start you on antibiotics.   For your leg cramps, I will test for any electrolyte abnormalities that may be causing this and will call you with the results.   For your diabetes, at this time, we recommend for you to go back to Novolog 70/30 at 54U twice a day. Please follow up with the endocrinologist on Friday.   Please contact us if you have any questions or concerns.    Thank you!

## 2019-05-14 NOTE — Progress Notes (Signed)
   CC: left side pain   HPI:  [name] is a 58 y.o. female with PMHx of chronic back pain, HTN, HLD, and poorly controlled diabetes mellitus w/peripheral neuropathy. Patient presents with left side pain for one week duration. Please see problem based charting for further assessment and plan.   Past Medical History:  Diagnosis Date  . Allergy   . Arthritis    back   . Asthma    AS CHILD  . Chronic back pain   . Chronic leg pain    due to back pain  . Cocaine abuse ([place])    in remission  . Diabetes ([place])    with neuropathy  . GERD (gastroesophageal reflux disease)   . Hyperlipidemia   . Hypertension   . Neuromuscular disorder ([facility])    neuropathy  . RECTAL BLEEDING 12/09/2008   Annotation: s/p EGD 7/08 mild gastritis, s/p colonoscopy 7/08- benign polyp  s/p polypectomy and isolated diverticulum.  Qualifier: Diagnosis of  By: Ditzler RN, Debra    . Sleep apnea    CPAP    YEARS AGO DONE 1/2 YEARS AGO AND WAS TOLD DID NOT HAVE  . Tobacco abuse   . Uterine fibroid    s/p hysterectomy   Review of Systems:  Review of Systems  Constitutional: Negative for chills, diaphoresis, fever and malaise/fatigue.  Respiratory: Negative for cough and shortness of breath.   Cardiovascular: Negative for chest pain and palpitations.  Genitourinary: Positive for dysuria, flank pain and frequency. Negative for hematuria and urgency.  Musculoskeletal: Positive for back pain and myalgias. Negative for falls and joint pain.  Neurological: Positive for sensory change. Negative for dizziness, focal weakness, weakness and headaches.       Bilateral leg cramping      Physical Exam:  Vitals:   05/14/19 1528  BP: (!) 136/54  Pulse: 83  Temp: 98.5 F (36.9 C)  TempSrc: Oral  Weight: 255 lb 8 oz (115.9 kg)  Height: 5\' 4"  (1.626 m)   Physical Exam Vitals signs reviewed.  Constitutional:      General: She is not in acute distress.    Appearance: Normal appearance. She is not diaphoretic.   Cardiovascular:     Rate and Rhythm: Normal rate and regular rhythm.     Pulses: Normal pulses.     Heart sounds: Normal heart sounds. No murmur. No friction rub.  Pulmonary:     Effort: Pulmonary effort is normal. No respiratory distress.     Breath sounds: Normal breath sounds. No wheezing.  Abdominal:     General: Bowel sounds are normal.     Palpations: Abdomen is soft.     Tenderness: There is no abdominal tenderness. There is left CVA tenderness. There is no guarding.  Musculoskeletal: Normal range of motion.        General: No swelling or tenderness.  Skin:    General: Skin is warm and dry.     Capillary Refill: Capillary refill takes less than 2 seconds.  Neurological:     General: No focal deficit present.     Mental Status: She is alert and oriented to person, place, and time.      Assessment & Plan:   See Encounters Tab for problem based charting.  Patient seen with Dr. Philipp Ovens

## 2019-05-14 NOTE — Assessment & Plan Note (Addendum)
Patient follows at [facility] for her chronic back pain that is well controlled. However, for the past week she has had left sided pain that is worse with lying down on her left side. She denies any direct trauma to the area or heavy lifting. She also endorses urinary symptoms of increased frequency and dysuria. No fevers. She also noted bilateral leg cramping nightly. Suspect for restless leg syndrome vs electrolyte abnormalities.   - UA to assess for UTI - If negative, conservative treatment  - BMP to assess for any electrolyte abnormalities; if none, will consider restless leg syndrome which is commonly associated with iron deficiency - checking CBC and will f/u with iron panel

## 2019-05-15 ENCOUNTER — Other Ambulatory Visit: Payer: Self-pay

## 2019-05-15 LAB — URINALYSIS, COMPLETE
Bilirubin, UA: NEGATIVE
Ketones, UA: NEGATIVE
Leukocytes,UA: NEGATIVE
Nitrite, UA: POSITIVE — AB
Protein,UA: NEGATIVE
RBC, UA: NEGATIVE
Specific Gravity, UA: >=1.03 — AB (ref 1.005–1.030)
Urobilinogen, Ur: 0.2 mg/dL (ref 0.2–1.0)
pH, UA: 5.5 (ref 5.0–7.5)

## 2019-05-15 LAB — MICROSCOPIC EXAMINATION
Casts: NONE SEEN /lpf
Epithelial Cells (non renal): >10 /hpf — AB (ref 0–10)

## 2019-05-15 NOTE — Progress Notes (Signed)
Internal Medicine Clinic Attending  I saw and evaluated the patient.  I personally confirmed the key portions of the history and exam documented by Dr. Aslam and I reviewed pertinent patient test results.  The assessment, diagnosis, and plan were formulated together and I agree with the documentation in the resident's note.     

## 2019-05-16 NOTE — Progress Notes (Signed)
Name: Kierra Jezewski  Age/ Sex: 58 y.o., female   MRN/ DOB: [id number], 01/01/1961     PCP: Harvie Heck, MD   Reason for Endocrinology Evaluation: Type 2 Diabetes Mellitus  Initial Endocrine Consultative Visit: 03/18/2019    PATIENT IDENTIFIER: Ms. Khali Perella is a 58 y.o. female with a past medical history of T2DM, HTN, OSA and dyslipidemia . The patient has followed with Endocrinology clinic since 03/18/2019 for consultative assistance with management of her diabetes.  DIABETIC HISTORY:  Ms. Gavidia was diagnosed with T2DM many years ago. Has been on Soliqua, Glipizide and V-Go was started in 2019.  Her hemoglobin A1c has ranged from 8.1% in 2016, peaking at 9.8% in 2020.  On her initial visit to our clinic, she was on V-Go 40 with Humulin U-500 , Metformin, and Ozempic with an A1c 9.7% . We stopped the V-Go  And the U-500 due to recurrent hypoglycemia . We started Novolog Mix and continued metformin and Ozempic.  SUBJECTIVE:   During the last visit (03/15/2019): she was on V-Go 40 with Humulin U-500 . Metformin, and Ozempic with an A1c 9.7% . We stopped the V-Go . We started Humalog Mix instead of U-500 and continued metformin and Ozempic.    Today (05/17/2019): Ms. Franzen is here for a follow up on her diabetes care.  She checks her blood sugars multiple times daily, through freestyle libre. The patient has  had hypoglycemic episodes since the last clinic visit, which typically occur during the day , this was caused by the patient putting herself back on the V-Go with using Novolog Mix. The patient is symptomatic with these episodes. Otherwise, the patient has not required any recent emergency interventions for hypoglycemia and has not had recent hospitalizations secondary to hyper or hypoglycemic episodes.    ROS: As per HPI and as detailed below: Review of Systems  Constitutional: Negative for chills and fever.  HENT: Negative for congestion and sore throat.   Respiratory: Negative for  cough and shortness of breath.   Cardiovascular: Negative for chest pain and palpitations.  Gastrointestinal: Negative for diarrhea and nausea.      HOME DIABETES REGIMEN:  Metformin 1000 tablet Twice daily  Ozempic 0.5 mg weekly  Novolog Mix (70/30) 36 units before breakfast and Supper - taking 54 units      CONTINUOUS GLUCOSE MONITORING RECORD INTERPRETATION    Dates of Recording: 11/21- 05/17/2019  Sensor description: [employer]  Results statistics:   CGM use % of time 63  Average and SD 241/30.2  Time in range   18     %  % Time Above 180 37  % Time above 250 44  % Time Below target 1    Glycemic patterns summary: Hyperglycemia throughout the day and night , worst post-prandial   Hyperglycemic episodes  As above   Hypoglycemic episodes occurred rarely - only when she was on the V-Go   Overnight periods: trend down   Preprandial periods: high      HISTORY:  Past Medical History:  Past Medical History:  Diagnosis Date  . Allergy   . Arthritis    back   . Asthma    AS CHILD  . Chronic back pain   . Chronic leg pain    due to back pain  . Cocaine abuse ([place])    in remission  . Diabetes ([place])    with neuropathy  . GERD (gastroesophageal reflux disease)   . Hyperlipidemia   . Hypertension   .  Neuromuscular disorder ([facility])    neuropathy  . RECTAL BLEEDING 12/09/2008   Annotation: s/p EGD 7/08 mild gastritis, s/p colonoscopy 7/08- benign polyp  s/p polypectomy and isolated diverticulum.  Qualifier: Diagnosis of  By: Ditzler RN, Debra    . Sleep apnea    CPAP    YEARS AGO DONE 1/2 YEARS AGO AND WAS TOLD DID NOT HAVE  . Tobacco abuse   . Uterine fibroid    s/p hysterectomy   Past Surgical History:  Past Surgical History:  Procedure Laterality Date  . BACK SURGERY  2012   L5-S1 microendoscopic disectomy last surgery 06/2011  . BREAST BIOPSY     LEFT    01/19/16  . BREAST REDUCTION SURGERY  1982  . COLONOSCOPY    . HAND SURGERY     MIDDLE  TRIGGER FINGER RIGHT SIDE  . RADIOACTIVE SEED GUIDED EXCISIONAL BREAST BIOPSY Left 02/18/2016   Procedure: LEFT RADIOACTIVE SEED GUIDED EXCISIONAL BREAST BIOPSY;  Surgeon: Alphonsa Overall, MD;  Location: [name];  Service: General;  Laterality: Left;  . TONSILLECTOMY  2008  . TOTAL ABDOMINAL HYSTERECTOMY  05/24/2007   hysterectomy  . TUBAL LIGATION    . UPPER GASTROINTESTINAL ENDOSCOPY      Social History:  reports that she quit smoking about 22 years ago. Her smoking use included cigarettes. She has a 10.00 pack-year smoking history. She has never used smokeless tobacco. She reports that she does not drink alcohol or use drugs. Family History:  Family History  Problem Relation Age of Onset  . Diabetes Father   . Heart disease Father   . Hypertension Father   . Diabetes Mother   . Cancer Mother        brain  . Hypertension Mother   . Kidney disease Sister   . Diabetes Sister   . Kidney cancer Sister   . Colon cancer Neg Hx   . Colon polyps Neg Hx   . Esophageal cancer Neg Hx   . Rectal cancer Neg Hx   . Stomach cancer Neg Hx      HOME MEDICATIONS: Allergies as of 05/17/2019      Reactions   Lisinopril Cough      Medication List       Accurate as of May 17, 2019 11:49 AM. If you have any questions, ask your nurse or doctor.        acyclovir 400 MG tablet Commonly known as: ZOVIRAX Take 1 tablet (400 mg total) by mouth 3 (three) times daily as needed (flares).   aspirin 81 MG EC tablet Take 81 mg by mouth daily.   atorvastatin 80 MG tablet Commonly known as: Lipitor Take 1 tablet (80 mg total) by mouth daily.   budesonide-formoterol 80-4.5 MCG/ACT inhaler Commonly known as: Symbicort Inhale 2 puffs into the lungs 2 (two) times daily.   chlorpheniramine 4 MG tablet Commonly known as: CHLOR-TRIMETON Take 4 mg by mouth every 4 (four) hours as needed for allergies.   Continuous Glucose Monitor Devi Use as directed.   FreeStyle Deal 2 Reader [employer] 1 each  by Does not apply route 4 (four) times daily.   FreeStyle Libre 2 Sensor Systm Misc 1 each by Does not apply route 4 (four) times daily.   gabapentin 300 MG capsule Commonly known as: NEURONTIN Take 1 capsule (300 mg total) by mouth 2 (two) times daily.   GARLIC 5701 PO Take by mouth.   hydrochlorothiazide 25 MG tablet Commonly known as: HYDRODIURIL Take 1 tablet (  25 mg total) by mouth daily.   Insulin Pen Needle 31G X 5 MM Misc Commonly known as: B-D UF III MINI PEN NEEDLES Use to inject insulin twice a day   INSULIN SYRINGE 1CC/30GX1/2" 30G X 1/2" 1 ML Misc Use to fill Vgo daily   irbesartan 150 MG tablet Commonly known as: Avapro Take 2 tablets (300 mg total) by mouth daily.   metFORMIN 1000 MG tablet Commonly known as: GLUCOPHAGE TAKE 1 TABLET BY MOUTH TWICE DAILY WITH A MEAL   montelukast 10 MG tablet Commonly known as: SINGULAIR Take 1 tablet (10 mg total) by mouth at bedtime.   multivitamin with minerals Tabs tablet Take 1 tablet by mouth daily.   Narcan 4 MG/0.1ML Liqd nasal spray kit Generic drug: naloxone USE 1 SPRAY IN THE NOSE AS NEEDED   NovoLOG Mix 70/30 FlexPen (70-30) 100 UNIT/ML FlexPen Generic drug: insulin aspart protamine - aspart Inject 0.64 mLs (64 Units total) into the skin 2 (two) times daily with a meal. What changed: how much to take Changed by: Dorita Sciara, MD   OneTouch Delica Lancets Fine Misc Check blood sugar 3 times a day   OneTouch Verio Flex System w/Device Kit 1 each by Does not apply route 3 (three) times daily.   OneTouch Verio test strip Generic drug: glucose blood CHECK BLOOD SUGAR 3 TIMES A DAY   oxyCODONE-acetaminophen 5-325 MG tablet Commonly known as: Percocet Take 1 tablet by mouth every 8 (eight) hours as needed for severe pain.   Ozempic (0.25 or 0.5 MG/DOSE) 2 MG/1.5ML Sopn Generic drug: Semaglutide(0.25 or 0.5MG/DOS) Inject 0.5 mg into the skin once a week.   pantoprazole 40 MG tablet Commonly  known as: PROTONIX Take 1 tablet (40 mg total) by mouth 2 (two) times daily.   QC TUMERIC COMPLEX PO Take by mouth.   rosuvastatin 20 MG tablet Commonly known as: Crestor Take 1 tablet (20 mg total) by mouth at bedtime.   valACYclovir 500 MG tablet Commonly known as: VALTREX TAKE 1 TABLET BY MOUTH TWICE DAILY FOR 5 DAYS AT TIME OF OUTBREAK AND 1 EVERY DAY FOR SUPPRESSION   vitamin C 100 MG tablet Take 100 mg by mouth daily.   Vitamin D (Ergocalciferol) 1.25 MG (50000 UT) Caps capsule Commonly known as: DRISDOL Take 1 capsule (50,000 Units total) by mouth every 7 (seven) days.        OBJECTIVE:   Vital Signs: BP (!) 144/88 (BP Location: Left Arm, Patient Position: Sitting, Cuff Size: Large)   Pulse 88   Temp 98.4 F (36.9 C)   Ht 5' 4"  (1.626 m)   Wt 257 lb 9.6 oz (116.8 kg)   SpO2 98%   BMI 44.22 kg/m   Wt Readings from Last 3 Encounters:  05/17/19 257 lb 9.6 oz (116.8 kg)  05/14/19 255 lb 8 oz (115.9 kg)  03/15/19 253 lb 6.4 oz (114.9 kg)     Exam: General: Pt appears well and is in NAD  Lungs: Clear with good BS bilat with no rales, rhonchi, or wheezes  Heart: RRR with normal S1 and S2 and no gallops; no murmurs; no rub  Extremities: No pretibial edema.   Skin: Normal texture and temperature to palpation.   Neuro: MS is good with appropriate affect, pt is alert and Ox3    DM foot exam: Deferred- pt has a right foot boot in place          DATA REVIEWED:  Lab Results  Component Value Date  HGBA1C 10.8 (A) 05/14/2019   HGBA1C 9.7 (H) 01/11/2019   HGBA1C 9.8 (A) 12/10/2018   Lab Results  Component Value Date   MICROALBUR 0.99 09/30/2009   LDLCALC 183 (H) 02/12/2019   CREATININE 0.76 05/14/2019   Lab Results  Component Value Date   MICRALBCREAT 15 02/12/2019     Lab Results  Component Value Date   CHOL 283 (H) 02/12/2019   HDL 37 (L) 02/12/2019   LDLCALC 183 (H) 02/12/2019   TRIG 319 (H) 02/12/2019   CHOLHDL 7.6 (H) 02/12/2019          ASSESSMENT / PLAN / RECOMMENDATIONS:   1) Type 2 Diabetes Mellitus, Poorly controlled, With Neuropathy complications - Most recent A1c of 10.8 %. Goal A1c < 7.0 %.     - Pt continues with hyperglycemia, she also continues with dietary indiscretions and medication non-adherence . Pt also noted to skip taking Novolog Mix if her pre-meal glucose is "good" . We did discuss an example where last night her pre-supper BG was 157 mg/dL , she did not take her insulin and this morning BG was 219 mg/dL.  - We also discussed not to use V-Go pump with current insulin regimen.  - Her A1c has trended up but this is partly due to reduction in hypoglycemia episodes (1% on current regimen vs 4% on previous regimen ) - Will increase insulin as below, and continue metformin .  - We discussed future plans of increasing Ozempic vs adding an SGLT-2 inhibitors if no contra-indications.  - We again emphasized the importance of avoiding sugar-sweetened beverages and low CHO diet.     Plan: MEDICATIONS: -  Continue Metformin 1 tablet Twice daily  - Continue Ozempic 0.5 mg weekly  - Increase  Novolog Mix (70/30) 64 units before breakfast and Supper    EDUCATION / INSTRUCTIONS:  BG monitoring instructions: Patient is instructed to check her blood sugars 2 times a day, fasting and bedtime .  Call [facility] if: BG persistently < 70 or > 300. . I reviewed the Rule of 15 for the treatment of hypoglycemia in detail with the patient. Literature supplied.    F/U in 3 months    Signed electronically by: Mack Guise, MD  [facility]  [address] [place], [place] [address] Phone: [phone] FAX: [phone]   CC: Harvie Heck, MD [address] [place] [address] Phone: [phone]  Fax: [phone]  Return to Endocrinology clinic as below: Future Appointments  Date Time Provider [place]   05/29/2019 10:00 AM Baird Lyons D, MD [facility] None  06/13/2019 10:15 AM Trula Slade, DPM [facility]  07/02/2019  2:00 PM [facility] MM 2 [facility]  08/16/2019  8:50 AM Liesl Simons, Melanie Crazier, MD [facility] None

## 2019-05-17 ENCOUNTER — Telehealth: Payer: Self-pay | Admitting: Internal Medicine

## 2019-05-17 ENCOUNTER — Ambulatory Visit (INDEPENDENT_AMBULATORY_CARE_PROVIDER_SITE_OTHER): Payer: PPO | Admitting: Internal Medicine

## 2019-05-17 ENCOUNTER — Other Ambulatory Visit: Payer: Self-pay | Admitting: Internal Medicine

## 2019-05-17 ENCOUNTER — Encounter: Payer: Self-pay | Admitting: Internal Medicine

## 2019-05-17 DIAGNOSIS — E1140 Type 2 diabetes mellitus with diabetic neuropathy, unspecified: Secondary | ICD-10-CM | POA: Diagnosis not present

## 2019-05-17 DIAGNOSIS — Z794 Long term (current) use of insulin: Secondary | ICD-10-CM

## 2019-05-17 MED ORDER — NOVOLOG MIX 70/30 FLEXPEN (70-30) 100 UNIT/ML ~~LOC~~ SUPN: 64.0000 [IU] | PEN_INJECTOR | Freq: Two times a day (BID) | SUBCUTANEOUS | 11 refills | Status: DC

## 2019-05-17 MED ORDER — NITROFURANTOIN MONOHYD MACRO 100 MG PO CAPS: 100.0000 mg | ORAL_CAPSULE | Freq: Two times a day (BID) | ORAL | 0 refills | Status: AC

## 2019-05-17 NOTE — Telephone Encounter (Signed)
Called [name] to follow up with her UA results. Patient is still experiencing left side pain, although not as bad as before. UA did show bacteruria and as she is still symptomatic, will treat with macrobid. She denies any other urinary symptoms at this time.  She reports that she had an appointment with the endocrinologist today and was instructed to take Novolog 70/30 64U bid. Offered support.

## 2019-05-17 NOTE — Patient Instructions (Signed)
-   Continue Metformin 1 tablet Twice daily  - Continue Ozempic 0.5 mg weekly  - Increase  Novolog Mix (70/30) 64 units before breakfast and Supper     Choose healthy, lower carb lower calorie snacks: toss salad, cooked vegetables, cottage cheese, peanut butter, low fat cheese / string cheese, lower sodium deli meat, tuna salad or chicken salad     HOW TO TREAT LOW BLOOD SUGARS (Blood sugar LESS THAN 70 MG/DL)  Please follow the RULE OF 15 for the treatment of hypoglycemia treatment (when your (blood sugars are less than 70 mg/dL)    STEP 1: Take 15 grams of carbohydrates when your blood sugar is low, which includes:   3-4 GLUCOSE TABS  OR  3-4 OZ OF JUICE OR REGULAR SODA OR  ONE TUBE OF GLUCOSE GEL     STEP 2: RECHECK blood sugar in 15 MINUTES STEP 3: If your blood sugar is still low at the 15 minute recheck --> then, go back to STEP 1 and treat AGAIN with another 15 grams of carbohydrates.

## 2019-05-18 LAB — BMP8+ANION GAP
Anion Gap: 33 mmol/L — ABNORMAL HIGH (ref 10.0–18.0)
BUN/Creatinine Ratio: 11 (ref 9–23)
BUN: 8 mg/dL (ref 6–24)
CO2: 29 mmol/L (ref 20–29)
Calcium: 9.9 mg/dL (ref 8.7–10.2)
Chloride: 93 mmol/L — ABNORMAL LOW (ref 96–106)
Creatinine, Ser: 0.76 mg/dL (ref 0.57–1.00)
GFR calc Af Amer: 100 mL/min/{1.73_m2} (ref >59)
GFR calc non Af Amer: 87 mL/min/{1.73_m2} (ref >59)
Glucose: 210 mg/dL — ABNORMAL HIGH (ref 65–99)
Potassium: 3.7 mmol/L (ref 3.5–5.2)
Sodium: 155 mmol/L — ABNORMAL HIGH (ref 134–144)

## 2019-05-29 ENCOUNTER — Other Ambulatory Visit: Payer: Self-pay

## 2019-05-29 ENCOUNTER — Ambulatory Visit (INDEPENDENT_AMBULATORY_CARE_PROVIDER_SITE_OTHER): Payer: PPO | Admitting: Internal Medicine

## 2019-05-29 DIAGNOSIS — J449 Chronic obstructive pulmonary disease, unspecified: Secondary | ICD-10-CM

## 2019-05-29 DIAGNOSIS — Z79899 Other long term (current) drug therapy: Secondary | ICD-10-CM | POA: Diagnosis not present

## 2019-05-29 DIAGNOSIS — Z9989 Dependence on other enabling machines and devices: Secondary | ICD-10-CM | POA: Diagnosis not present

## 2019-05-29 DIAGNOSIS — M5136 Other intervertebral disc degeneration, lumbar region: Secondary | ICD-10-CM | POA: Diagnosis not present

## 2019-05-29 DIAGNOSIS — G4700 Insomnia, unspecified: Secondary | ICD-10-CM

## 2019-05-29 DIAGNOSIS — G8929 Other chronic pain: Secondary | ICD-10-CM | POA: Diagnosis not present

## 2019-05-29 DIAGNOSIS — M545 Low back pain: Secondary | ICD-10-CM | POA: Diagnosis not present

## 2019-05-29 DIAGNOSIS — G4733 Obstructive sleep apnea (adult) (pediatric): Secondary | ICD-10-CM | POA: Diagnosis not present

## 2019-05-29 NOTE — Progress Notes (Signed)
11/26/2018- 31 yoF former smoker for sleep evaluation. OSA on CPAP; DME: [facility], pt states mask leaks, recently got new machine 3-4 mo ago Medical problem list includes HBP, COPD GOLD II, Hepatic steatosis, GERD, DM2, Lung nodule< 6 cm,  C/O snoring and stop breathing.  Epworth score 11 Body weight today 255 lbs NPSG 08/11/17- AHI 17.7/ hr, desaturation to 85%, 255 lbs CPAP auto 10-20/ [facility] Download- compliance 73%, AHI 0.6/ hr Mask is old and no longer sealing well.  Otherwise she has done well with CPAP and sleeps better. ENT surgery tonsils, denies heart problems.  05/29/2019- Virtual Visit via Telephone Note  I connected with [name] on 05/29/19 at 10:00 AM EST by telephone and verified that I am speaking with the correct person using two identifiers.  Location: Patient: H Provider: O   I discussed the limitations, risks, security and privacy concerns of performing an evaluation and management service by telephone and the availability of in person appointments. I also discussed with the patient that there may be a patient responsible charge related to this service. The patient expressed understanding and agreed to proceed.   History of Present Illness: 32 yoF former smoker followed for OSA, complicated by  HBP, COPD GOLD II, Hepatic steatosis, GERD, DM2, Lung nodule< 6 cm,  Dr Melvyn Novas has seen her for cough, last on 01/31/2019  CPAP auto 10-20/ [facility] Blames short nights on insomnia. Says mask cuts around her nose. Says breathing is fine, using Symbicort at intervals when needed. Remote hx lung nodule not seen on CXR 1 yr ago. Had flu vax.  Observations/Objective: Download compliance 80%, AHI 0.4/ hr    Some short nights but used every night  Assessment and Plan: OSA- benefits. Plan refit mask, continue 5-15 Insomnia- discussed sleep hygiene. Ok to occasionally use otc sleep aid. Discuss if insufficient.  Follow Up Instructions: 1 year   I discussed the assessment and treatment  plan with the patient. The patient was provided an opportunity to ask questions and all were answered. The patient agreed with the plan and demonstrated an understanding of the instructions.   The patient was advised to call back or seek an in-person evaluation if the symptoms worsen or if the condition fails to improve as anticipated.  I provided 18 minutes of non-face-to-face time during this encounter.   Baird Lyons, MD     ROS-see HPI   + = positive Constitutional:    weight loss, night sweats, fevers, chills, fatigue, lassitude. HEENT:    headaches, difficulty swallowing, tooth/dental problems, sore throat,       sneezing, itching, ear ache, +nasal congestion, post nasal drip, snoring CV:    chest pain, orthopnea, PND, +swelling in lower extremities, anasarca,                                  dizziness, palpitations Resp:  + shortness of breath with exertion or at rest.                productive cough,  + non-productive cough, coughing up of blood.              change in color of mucus.  wheezing.   Skin:    rash or lesions. GI:  + heartburn, indigestion, +abdominal pain, nausea, vomiting, diarrhea,                 change in bowel habits, loss of appetite GU:  dysuria, change in color of urine, no urgency or frequency.   flank pain. MS:   +joint pain, stiffness, decreased range of motion, back pain. Neuro-     nothing unusual Psych:  change in mood or affect.  depression or anxiety.   memory loss.  OBJ- Physical Exam General- Alert, Oriented, Affect-appropriate, Distress- none acute, + obese Skin- rash-none, lesions- none, excoriation- none Lymphadenopathy- none Head- atraumatic            Eyes- Gross vision intact, PERRLA, conjunctivae and secretions clear            Ears- Hearing, canals-normal            Nose- Clear, no-Septal dev, mucus, polyps, erosion, perforation             Throat- Mallampati IV , mucosa clear , drainage- none, tonsils- atrophic Neck- flexible ,  trachea midline, no stridor , thyroid nl, carotid no bruit Chest - symmetrical excursion , unlabored           Heart/CV- RRR , no murmur , no gallop  , no rub, nl s1 s2                           - JVD- none , edema- none, stasis changes- none, varices- none           Lung- clear to P&A, wheeze- none, cough- none , dullness-none, rub- none           Chest wall-  Abd-  Br/ Gen/ Rectal- Not done, not indicated Extrem- cyanosis- none, clubbing, none, atrophy- none, strength- nl Neuro- grossly intact to observation

## 2019-05-29 NOTE — Patient Instructions (Signed)
Order- schedule mask fitting at sleep center    Dx OSA  We can continue CPAP auto 10-20, mask of choice, humidifier, supplies, AirView/ card  [facility] to use an over the counter sleep aid occasionally if needed  Please call if we can help

## 2019-05-30 ENCOUNTER — Encounter: Payer: Self-pay | Admitting: Internal Medicine

## 2019-05-30 NOTE — Assessment & Plan Note (Signed)
Benefits and meets goals but needs help with mask fit/ comfort Plan- continue auto 5-15/ [employer]. Refer for mask fitting.

## 2019-05-30 NOTE — Assessment & Plan Note (Signed)
She expects call to schedule f/u with Dr Melvyn Novas but for now feels well controlled, using Symbicort at intervals as needed.

## 2019-06-03 ENCOUNTER — Other Ambulatory Visit: Payer: Self-pay | Admitting: *Deleted

## 2019-06-03 DIAGNOSIS — Z794 Long term (current) use of insulin: Secondary | ICD-10-CM

## 2019-06-03 DIAGNOSIS — E1140 Type 2 diabetes mellitus with diabetic neuropathy, unspecified: Secondary | ICD-10-CM

## 2019-06-04 MED ORDER — FREESTYLE LIBRE 2 SENSOR SYSTM MISC: 1.0000 | Freq: Four times a day (QID) | 0 refills | Status: DC

## 2019-06-05 ENCOUNTER — Telehealth: Payer: Self-pay | Admitting: Internal Medicine

## 2019-06-05 DIAGNOSIS — G4733 Obstructive sleep apnea (adult) (pediatric): Secondary | ICD-10-CM | POA: Diagnosis not present

## 2019-06-06 NOTE — Addendum Note (Signed)
Addended by: Forde Dandy on: 06/06/2019 12:10 PM   Modules accepted: Orders

## 2019-06-10 NOTE — Telephone Encounter (Signed)
[facility] [phone]

## 2019-06-12 NOTE — Telephone Encounter (Signed)
done

## 2019-06-12 NOTE — Telephone Encounter (Signed)
Returned call to [place] at [facility]. No answer. Left message on VM requesting return call. Hubbard Hartshorn, RN, BSN

## 2019-06-13 ENCOUNTER — Ambulatory Visit: Payer: PPO | Admitting: Podiatry

## 2019-06-13 ENCOUNTER — Other Ambulatory Visit: Payer: Self-pay

## 2019-06-13 ENCOUNTER — Ambulatory Visit (INDEPENDENT_AMBULATORY_CARE_PROVIDER_SITE_OTHER): Payer: PPO

## 2019-06-13 DIAGNOSIS — S99191K Other physeal fracture of right metatarsal, subsequent encounter for fracture with nonunion: Secondary | ICD-10-CM | POA: Diagnosis not present

## 2019-06-13 NOTE — Progress Notes (Signed)
Subjective: 58 year old female presents the office today for follow-up evaluation of her right foot, Jones fracture.  She states that she has been doing well and she is not having any significant discomfort.  She had release of the cam boot the majority time but she has gone without the boot.  She has an occasional discomfort without the boot but more into the heel.  She denies any recent injury or falls.  She is still using a bone stimulator.  She has no other concerns today.    She states her blood sugars have been running under 200 now.  Denies any fevers, chills, nausea, vomiting.  No calf pain, chest pain, shortness of breath.  Objective: AAO x3, NAD DP/PT pulses palpable bilaterally, CRT less than 3 seconds There is no discomfort to palpation on the fifth metatarsal base.  Trace edema and there is no erythema or warmth.  No other areas of tenderness.  No pain the peroneal tendon no pain with calf compression, swelling, warmth, erythema  Assessment: Nonunion right Jones fracture; vitamin D deficiency and uncontrolled diabetes  Plan: -All treatment options discussed with the patient including all alternatives, risks, complications.  -X-rays are to be reviewed. Fifth metatarsal base fracture still evident consistent with a Jones fracture however increased consolidation. -At this time transition back to regular shoe as tolerated.  Discussed physical therapy but she wishes to hold off on this.  I gave her exercises for plantar fascias that she can start to do.  Should she want to do physical therapy let me know.  Continue ventilator for now. -Glucose control   Return in about 4 weeks (around 07/11/2019). If doing well will follow up with her PRN  Trula Slade DPM

## 2019-06-13 NOTE — Patient Instructions (Signed)

## 2019-06-19 ENCOUNTER — Other Ambulatory Visit: Payer: Self-pay | Admitting: Internal Medicine

## 2019-06-19 DIAGNOSIS — E1140 Type 2 diabetes mellitus with diabetic neuropathy, unspecified: Secondary | ICD-10-CM

## 2019-06-22 ENCOUNTER — Other Ambulatory Visit (HOSPITAL_COMMUNITY)
Admission: RE | Admit: 2019-06-22 | Discharge: 2019-06-22 | Disposition: A | Discharge status: Home / Self Care | Payer: PPO | Source: Ambulatory Visit | Attending: Internal Medicine | Admitting: Internal Medicine

## 2019-06-22 DIAGNOSIS — Z20822 Contact with and (suspected) exposure to covid-19: Secondary | ICD-10-CM | POA: Insufficient documentation

## 2019-06-22 DIAGNOSIS — Z01812 Encounter for preprocedural laboratory examination: Secondary | ICD-10-CM | POA: Diagnosis not present

## 2019-06-22 LAB — SARS CORONAVIRUS 2 (TAT 6-24 HRS): SARS Coronavirus 2: NEGATIVE

## 2019-06-25 ENCOUNTER — Ambulatory Visit (HOSPITAL_BASED_OUTPATIENT_CLINIC_OR_DEPARTMENT_OTHER): Payer: PPO | Attending: Internal Medicine | Admitting: Internal Medicine

## 2019-06-25 ENCOUNTER — Other Ambulatory Visit: Payer: Self-pay

## 2019-06-25 DIAGNOSIS — G4733 Obstructive sleep apnea (adult) (pediatric): Secondary | ICD-10-CM

## 2019-07-01 DIAGNOSIS — M545 Low back pain: Secondary | ICD-10-CM | POA: Diagnosis not present

## 2019-07-01 DIAGNOSIS — G8929 Other chronic pain: Secondary | ICD-10-CM | POA: Diagnosis not present

## 2019-07-01 DIAGNOSIS — M5136 Other intervertebral disc degeneration, lumbar region: Secondary | ICD-10-CM | POA: Diagnosis not present

## 2019-07-01 DIAGNOSIS — Z79899 Other long term (current) drug therapy: Secondary | ICD-10-CM | POA: Diagnosis not present

## 2019-07-02 ENCOUNTER — Ambulatory Visit
Admission: RE | Admit: 2019-07-02 | Discharge: 2019-07-02 | Disposition: A | Discharge status: Home / Self Care | Payer: PPO | Source: Ambulatory Visit

## 2019-07-02 ENCOUNTER — Other Ambulatory Visit: Payer: Self-pay

## 2019-07-02 DIAGNOSIS — Z1231 Encounter for screening mammogram for malignant neoplasm of breast: Secondary | ICD-10-CM | POA: Diagnosis not present

## 2019-07-06 DIAGNOSIS — G4733 Obstructive sleep apnea (adult) (pediatric): Secondary | ICD-10-CM | POA: Diagnosis not present

## 2019-07-11 ENCOUNTER — Ambulatory Visit (INDEPENDENT_AMBULATORY_CARE_PROVIDER_SITE_OTHER): Payer: PPO

## 2019-07-11 ENCOUNTER — Encounter: Payer: Self-pay | Admitting: Podiatry

## 2019-07-11 ENCOUNTER — Other Ambulatory Visit: Payer: Self-pay

## 2019-07-11 ENCOUNTER — Ambulatory Visit: Payer: PPO | Admitting: Podiatry

## 2019-07-11 DIAGNOSIS — S99191K Other physeal fracture of right metatarsal, subsequent encounter for fracture with nonunion: Secondary | ICD-10-CM

## 2019-07-11 DIAGNOSIS — M2042 Other hammer toe(s) (acquired), left foot: Secondary | ICD-10-CM | POA: Diagnosis not present

## 2019-07-11 DIAGNOSIS — M2041 Other hammer toe(s) (acquired), right foot: Secondary | ICD-10-CM | POA: Diagnosis not present

## 2019-07-11 DIAGNOSIS — E1165 Type 2 diabetes mellitus with hyperglycemia: Secondary | ICD-10-CM | POA: Diagnosis not present

## 2019-07-11 NOTE — Patient Instructions (Signed)
Diabetes Mellitus and Foot Care Foot care is an important part of your health, especially when you have diabetes. Diabetes may cause you to have problems because of poor blood flow (circulation) to your feet and legs, which can cause your skin to:  Become thinner and drier.  Break more easily.  Heal more slowly.  Peel and crack. You may also have nerve damage (neuropathy) in your legs and feet, causing decreased feeling in them. This means that you may not notice minor injuries to your feet that could lead to more serious problems. Noticing and addressing any potential problems early is the best way to prevent future foot problems. How to care for your feet Foot hygiene  Wash your feet daily with warm water and mild soap. Do not use hot water. Then, pat your feet and the areas between your toes until they are completely dry. Do not soak your feet as this can dry your skin.  Trim your toenails straight across. Do not dig under them or around the cuticle. File the edges of your nails with an emery board or nail file.  Apply a moisturizing lotion or petroleum jelly to the skin on your feet and to dry, brittle toenails. Use lotion that does not contain alcohol and is unscented. Do not apply lotion between your toes. Shoes and socks  Wear clean socks or stockings every day. Make sure they are not too tight. Do not wear knee-high stockings since they may decrease blood flow to your legs.  Wear shoes that fit properly and have enough cushioning. Always look in your shoes before you put them on to be sure there are no objects inside.  To break in new shoes, wear them for just a few hours a day. This prevents injuries on your feet. Wounds, scrapes, corns, and calluses  Check your feet daily for blisters, cuts, bruises, sores, and redness. If you cannot see the bottom of your feet, use a mirror or ask someone for help.  Do not cut corns or calluses or try to remove them with medicine.  If you  find a minor scrape, cut, or break in the skin on your feet, keep it and the skin around it clean and dry. You may clean these areas with mild soap and water. Do not clean the area with peroxide, alcohol, or iodine.  If you have a wound, scrape, corn, or callus on your foot, look at it several times a day to make sure it is healing and not infected. Check for: ? Redness, swelling, or pain. ? Fluid or blood. ? Warmth. ? Pus or a bad smell. General instructions  Do not cross your legs. This may decrease blood flow to your feet.  Do not use heating pads or hot water bottles on your feet. They may burn your skin. If you have lost feeling in your feet or legs, you may not know this is happening until it is too late.  Protect your feet from hot and cold by wearing shoes, such as at the beach or on hot pavement.  Schedule a complete foot exam at least once a year (annually) or more often if you have foot problems. If you have foot problems, report any cuts, sores, or bruises to your health care provider immediately. Contact a health care provider if:  You have a medical condition that increases your risk of infection and you have any cuts, sores, or bruises on your feet.  You have an injury that is not   healing.  You have redness on your legs or feet.  You feel burning or tingling in your legs or feet.  You have pain or cramps in your legs and feet.  Your legs or feet are numb.  Your feet always feel cold.  You have pain around a toenail. Get help right away if:  You have a wound, scrape, corn, or callus on your foot and: ? You have pain, swelling, or redness that gets worse. ? You have fluid or blood coming from the wound, scrape, corn, or callus. ? Your wound, scrape, corn, or callus feels warm to the touch. ? You have pus or a bad smell coming from the wound, scrape, corn, or callus. ? You have a fever. ? You have a red line going up your leg. Summary  Check your feet every day  for cuts, sores, red spots, swelling, and blisters.  Moisturize feet and legs daily.  Wear shoes that fit properly and have enough cushioning.  If you have foot problems, report any cuts, sores, or bruises to your health care provider immediately.  Schedule a complete foot exam at least once a year (annually) or more often if you have foot problems. This information is not intended to replace advice given to you by your health care provider. Make sure you discuss any questions you have with your health care provider. Document Revised: 02/20/2019 Document Reviewed: 07/01/2016 Elsevier Patient Education  2020 Elsevier Inc.  

## 2019-07-12 DIAGNOSIS — E109 Type 1 diabetes mellitus without complications: Secondary | ICD-10-CM | POA: Diagnosis not present

## 2019-07-12 NOTE — Progress Notes (Addendum)
Subjective: 59 year old female presents the office today for follow-up evaluation of her right foot, Jones fracture.  She is back to wearing regular shoe and she states that she is doing well.  She is also been using a bone stimulator.  She has some occasional discomfort but overall minimal and continues to improve.  She has no other concerns today. Denies any fevers, chills, nausea, vomiting.  No calf pain, chest pain, shortness of breath.  Last A1c 10.8 on 05/14/2019  Objective: AAO x3, NAD DP/PT pulses palpable bilaterally, CRT less than 3 seconds On today's exam there is no discomfort to palpation on the fifth metatarsal base.  Minimal discomfort with eversion.  Peroneal tendon appears to be intact.  No significant edema, erythema.  No other areas of tenderness identified today. Hammertoes present.  No pain with calf compression, swelling, warmth, erythema.  Assessment: Nonunion right Jones fracture-improvement  Plan: -All treatment options discussed with the patient including all alternatives, risks, complications.  -X-rays are to be reviewed.  Increased consolidation noted across Jones fracture.  The limits of acute fracture otherwise. -She is doing better she is back to regular shoe.  Continue with one regular shoe but there is any increasing pain or swelling to return to the cam boot and let me know.  Continue home rehab exercises. -Glucose control   I will see her back in 3 months for diabetic foot exam or sooner if any issues are to arise.  Trula Slade DPM

## 2019-07-26 ENCOUNTER — Ambulatory Visit: Payer: PPO | Admitting: Orthotics

## 2019-07-26 ENCOUNTER — Other Ambulatory Visit: Payer: Self-pay

## 2019-07-26 DIAGNOSIS — M2042 Other hammer toe(s) (acquired), left foot: Secondary | ICD-10-CM

## 2019-07-26 DIAGNOSIS — E1165 Type 2 diabetes mellitus with hyperglycemia: Secondary | ICD-10-CM

## 2019-07-26 DIAGNOSIS — M2041 Other hammer toe(s) (acquired), right foot: Secondary | ICD-10-CM

## 2019-08-03 DIAGNOSIS — M5136 Other intervertebral disc degeneration, lumbar region: Secondary | ICD-10-CM | POA: Diagnosis not present

## 2019-08-03 DIAGNOSIS — G8929 Other chronic pain: Secondary | ICD-10-CM | POA: Diagnosis not present

## 2019-08-03 DIAGNOSIS — M545 Low back pain: Secondary | ICD-10-CM | POA: Diagnosis not present

## 2019-08-03 DIAGNOSIS — Z79899 Other long term (current) drug therapy: Secondary | ICD-10-CM | POA: Diagnosis not present

## 2019-08-03 DIAGNOSIS — Z1159 Encounter for screening for other viral diseases: Secondary | ICD-10-CM | POA: Diagnosis not present

## 2019-08-03 DIAGNOSIS — R030 Elevated blood-pressure reading, without diagnosis of hypertension: Secondary | ICD-10-CM | POA: Diagnosis not present

## 2019-08-04 ENCOUNTER — Other Ambulatory Visit: Payer: Self-pay | Admitting: Pharmacist

## 2019-08-04 DIAGNOSIS — E1140 Type 2 diabetes mellitus with diabetic neuropathy, unspecified: Secondary | ICD-10-CM

## 2019-08-06 DIAGNOSIS — G4733 Obstructive sleep apnea (adult) (pediatric): Secondary | ICD-10-CM | POA: Diagnosis not present

## 2019-08-08 ENCOUNTER — Encounter: Payer: Self-pay | Admitting: *Deleted

## 2019-08-08 NOTE — Progress Notes (Signed)

## 2019-08-08 NOTE — Progress Notes (Signed)

## 2019-08-13 NOTE — Progress Notes (Signed)
Things That May Be Affecting Your Health:  Alcohol  Hearing loss x Pain   x Depression  Home Safety  Sexual Health  x Diabetes  Lack of physical activity  Stress   Difficulty with daily activities  Loneliness  Tiredness   Drug use x Medicines  Tobacco use   Falls  Motor Vehicle Safety  Weight   Food choices  Oral Health  Other    YOUR PERSONALIZED HEALTH PLAN : 1. Schedule your next subsequent Medicare Wellness visit in one year 2. Attend all of your regular appointments to address your medical issues 3. Complete the preventative screenings and services   Annual Wellness Visit   Medicare Covered Preventative Screenings and [place] Men and Women Who How Often Need? Date of Last Service Action  Abdominal Aortic Aneurysm Adults with AAA risk factors Once     Alcohol Misuse and Counseling All Adults Screening once a year if no alcohol misuse. Counseling up to 4 face to face sessions.     Bone Density Measurement  Adults at risk for osteoporosis Once every 2 yrs     Lipid Panel Z13.6 All adults without CV disease Once every 5 yrs     Colorectal Cancer   Stool sample or  Colonoscopy All adults 52 and older   Once every year  Every 10 years     Depression All Adults Once a year Y Today   Diabetes Screening Blood glucose, post glucose load, or GTT Z13.1  All adults at risk  Pre-diabetics  Once per year  Twice per year Y    Diabetes  Self-Management Training All adults Diabetics 10 hrs first year; 2 hours subsequent years. Requires Copay Y    Glaucoma  Diabetics  Family history of glaucoma  African Americans 15 yrs +  Hispanic Americans 31 yrs + Annually - requires coppay     Hepatitis C Z72.89 or F19.20  High Risk for HCV  Born between 1945 and 1965  Annually  Once     HIV Z11.4 All adults based on risk  Annually btw ages 59 & 99 regardless of risk  Annually > 65 yrs if at increased risk     Lung Cancer Screening Asymptomatic adults  aged 71-77 with 30 pack yr history and current smoker OR quit within the last 15 yrs Annually Must have counseling and shared decision making documentation before first screen     Medical Nutrition Therapy Adults with   Diabetes  Renal disease  Kidney transplant within past 3 yrs 3 hours first year; 2 hours subsequent years Y    Obesity and Counseling All adults Screening once a year Counseling if BMI 30 or higher Y Today   Tobacco Use Counseling Adults who use tobacco  Up to 8 visits in one year     Vaccines Z23  Hepatitis B  Influenza   Pneumonia  Adults   Once  Once every flu season  Two different vaccines separated by one year     Next Annual Wellness Visit People with Medicare Every year  Today     Services & Screenings Women Who How Often Need  Date of Last Service Action  Mammogram  Z12.31 Women over 32 One baseline ages 50-39. Annually ager 40 yrs+     Pap tests All women Annually if high risk. Every 2 yrs for normal risk women     Screening for cervical cancer with   Pap (Z01.419 nl or Z01.411abnl) &  HPV Z11.51 Women aged 51 to 43 Once every 5 yrs     Screening pelvic and breast exams All women Annually if high risk. Every 2 yrs for normal risk women     Sexually Transmitted Diseases  Chlamydia  Gonorrhea  Syphilis All at risk adults Annually for non pregnant females at increased risk         [place] Men Who How Ofter Need  Date of Last Service Action  Prostate Cancer - DRE & PSA Men over 50 Annually.  DRE might require a copay.     Sexually Transmitted Diseases  Syphilis All at risk adults Annually for men at increased risk

## 2019-08-14 ENCOUNTER — Other Ambulatory Visit: Payer: Self-pay

## 2019-08-16 ENCOUNTER — Ambulatory Visit: Payer: PPO | Admitting: Internal Medicine

## 2019-08-16 ENCOUNTER — Other Ambulatory Visit: Payer: Self-pay

## 2019-08-16 ENCOUNTER — Encounter: Payer: Self-pay | Admitting: Internal Medicine

## 2019-08-16 VITALS — BP 122/78 | HR 83 | Temp 98.4°F | Ht 64.0 in | Wt 249.6 lb

## 2019-08-16 DIAGNOSIS — E1140 Type 2 diabetes mellitus with diabetic neuropathy, unspecified: Secondary | ICD-10-CM | POA: Diagnosis not present

## 2019-08-16 DIAGNOSIS — E785 Hyperlipidemia, unspecified: Secondary | ICD-10-CM | POA: Insufficient documentation

## 2019-08-16 DIAGNOSIS — Z794 Long term (current) use of insulin: Secondary | ICD-10-CM | POA: Diagnosis not present

## 2019-08-16 DIAGNOSIS — E1165 Type 2 diabetes mellitus with hyperglycemia: Secondary | ICD-10-CM | POA: Diagnosis not present

## 2019-08-16 LAB — POCT GLYCOSYLATED HEMOGLOBIN (HGB A1C): Hemoglobin A1C: 9.4 % — AB (ref 4.0–5.6)

## 2019-08-16 LAB — LIPID PANEL
Cholesterol: 147 mg/dL (ref 0–200)
HDL: 36.3 mg/dL — ABNORMAL LOW (ref >39.00)
LDL Cholesterol: 91 mg/dL (ref 0–99)
NonHDL: 110.44
Total CHOL/HDL Ratio: 4
Triglycerides: 99 mg/dL (ref 0.0–149.0)
VLDL: 19.8 mg/dL (ref 0.0–40.0)

## 2019-08-16 LAB — BASIC METABOLIC PANEL
BUN: 11 mg/dL (ref 6–23)
CO2: 32 mEq/L (ref 19–32)
Calcium: 10.5 mg/dL (ref 8.4–10.5)
Chloride: 98 mEq/L (ref 96–112)
Creatinine, Ser: 0.76 mg/dL (ref 0.40–1.20)
GFR: 94.24 mL/min (ref >60.00)
Glucose, Bld: 251 mg/dL — ABNORMAL HIGH (ref 70–99)
Potassium: 3.8 mEq/L (ref 3.5–5.1)
Sodium: 139 mEq/L (ref 135–145)

## 2019-08-16 MED ORDER — NOVOLOG MIX 70/30 FLEXPEN (70-30) 100 UNIT/ML ~~LOC~~ SUPN: 70.0000 [IU] | PEN_INJECTOR | Freq: Two times a day (BID) | SUBCUTANEOUS | 11 refills | Status: DC

## 2019-08-16 MED ORDER — OZEMPIC (1 MG/DOSE) 2 MG/1.5ML ~~LOC~~ SOPN: 1.0000 mg | PEN_INJECTOR | SUBCUTANEOUS | 11 refills | Status: DC

## 2019-08-16 NOTE — Patient Instructions (Addendum)
-   Continue Metformin 1 tablet Twice daily  - Increase  Ozempic 1.0 mg weekly  - Increase  Novolog Mix (70/30) 70 units with  breakfast and 70 units with Supper     Choose healthy, lower carb lower calorie snacks: toss salad, cooked vegetables, cottage cheese, peanut butter, low fat cheese / string cheese, lower sodium deli meat, tuna salad or chicken salad     HOW TO TREAT LOW BLOOD SUGARS (Blood sugar LESS THAN 70 MG/DL)  Please follow the RULE OF 15 for the treatment of hypoglycemia treatment (when your (blood sugars are less than 70 mg/dL)    STEP 1: Take 15 grams of carbohydrates when your blood sugar is low, which includes:   3-4 GLUCOSE TABS  OR  3-4 OZ OF JUICE OR REGULAR SODA OR  ONE TUBE OF GLUCOSE GEL     STEP 2: RECHECK blood sugar in 15 MINUTES STEP 3: If your blood sugar is still low at the 15 minute recheck --> then, go back to STEP 1 and treat AGAIN with another 15 grams of carbohydrates.

## 2019-08-16 NOTE — Progress Notes (Signed)
Name: [name]  Age/ Sex: 59 y.o., female   MRN/ DOB: [id number], 1960/07/04     PCP: Harvie Heck, MD   Reason for Endocrinology Evaluation: Type 2 Diabetes Mellitus  Initial Endocrine Consultative Visit: 03/18/2019    PATIENT IDENTIFIER: [name] is a 59 y.o. female with a past medical history of T2DM, HTN, OSA and dyslipidemia . The patient has followed with Endocrinology clinic since 03/18/2019 for consultative assistance with management of her diabetes.  DIABETIC HISTORY:  [name] was diagnosed with T2DM many years ago. Has been on Soliqua, Glipizide and V-Go was started in 2019.  Her hemoglobin A1c has ranged from 8.1% in 2016, peaking at 9.8% in 2020.  On her initial visit to our clinic, she was on V-Go 40 with Humulin U-500 , Metformin, and Ozempic with an A1c 9.7% . We stopped the V-Go  And the U-500 due to recurrent hypoglycemia . We started Novolog Mix and continued metformin and Ozempic.  SUBJECTIVE:   During the last visit (05/17/2019): A1c 10.8% .  We increased her insulin regimen at the time.  We continued metformin and Ozempic.     Today (08/16/2019): [name] is here for a follow up on her diabetes care.  She checks her blood sugars multiple times daily, through freestyle libre. The patient has  had hypoglycemic episodes since the last clinic visit, she had one episode that she attributes to . The patient is symptomatic with these episodes. Otherwise, the patient has not required any recent emergency interventions for hypoglycemia and has not had recent hospitalizations secondary to hyper or hypoglycemic episodes.   She is complaining of right leg pain, that she attributes to Lipitor and would like that changed     ROS: As per HPI and as detailed below: Review of Systems  Gastrointestinal: Negative for diarrhea and nausea.  Musculoskeletal: Positive for joint pain.      HOME DIABETES REGIMEN:  Metformin 1000 tablet Twice daily  Ozempic 0.5 mg weekly    Novolog Mix (70/30) 64 units before breakfast and Supper - taking 60 units      CONTINUOUS GLUCOSE MONITORING RECORD INTERPRETATION    Dates of Recording: 2/20-08/16/2019  Sensor description: [employer]  Results statistics:   CGM use % of time 70  Average and SD 219/16.9  Time in range   13  %  % Time Above 180 69  % Time above 250 18  % Time Below target 0    Glycemic patterns summary: Hyperglycemia throughout the day close to goal at night  Hyperglycemic episodes  As above   Hypoglycemic episodes occurred rarely - one time in the past 2 weeks, pt not sure if this was a CGM error Overnight periods: trend down   Preprandial periods: high      HISTORY:  Past Medical History:  Past Medical History:  Diagnosis Date  . Allergy   . Arthritis    back   . Asthma    AS CHILD  . Chronic back pain   . Chronic leg pain    due to back pain  . Cocaine abuse ([place])    in remission  . Diabetes ([place])    with neuropathy  . GERD (gastroesophageal reflux disease)   . Hyperlipidemia   . Hypertension   . Neuromuscular disorder ([facility])    neuropathy  . RECTAL BLEEDING 12/09/2008   Annotation: s/p EGD 7/08 mild gastritis, s/p colonoscopy 7/08- benign polyp  s/p polypectomy and isolated diverticulum.  Qualifier: Diagnosis of  By: Ditzler RN, Debra    . Sleep apnea    CPAP    YEARS AGO DONE 1/2 YEARS AGO AND WAS TOLD DID NOT HAVE  . Tobacco abuse   . Uterine fibroid    s/p hysterectomy   Past Surgical History:  Past Surgical History:  Procedure Laterality Date  . BACK SURGERY  2012   L5-S1 microendoscopic disectomy last surgery 06/2011  . BREAST BIOPSY     LEFT    01/19/16  . BREAST EXCISIONAL BIOPSY Left 02/2016  . BREAST REDUCTION SURGERY  1982  . COLONOSCOPY    . HAND SURGERY     MIDDLE TRIGGER FINGER RIGHT SIDE  . RADIOACTIVE SEED GUIDED EXCISIONAL BREAST BIOPSY Left 02/18/2016   Procedure: LEFT RADIOACTIVE SEED GUIDED EXCISIONAL BREAST BIOPSY;  Surgeon: Alphonsa Overall,  MD;  Location: [facility];  Service: General;  Laterality: Left;  . REDUCTION MAMMAPLASTY Bilateral   . TONSILLECTOMY  2008  . TOTAL ABDOMINAL HYSTERECTOMY  05/24/2007   hysterectomy  . TUBAL LIGATION    . UPPER GASTROINTESTINAL ENDOSCOPY      Social History:  reports that she quit smoking about 23 years ago. Her smoking use included cigarettes. She has a 10.00 pack-year smoking history. She has never used smokeless tobacco. She reports that she does not drink alcohol or use drugs. Family History:  Family History  Problem Relation Age of Onset  . Diabetes Father   . Heart disease Father   . Hypertension Father   . Diabetes Mother   . Cancer Mother        brain  . Hypertension Mother   . Kidney disease Sister   . Diabetes Sister   . Kidney cancer Sister   . Colon cancer Neg Hx   . Colon polyps Neg Hx   . Esophageal cancer Neg Hx   . Rectal cancer Neg Hx   . Stomach cancer Neg Hx      HOME MEDICATIONS: Allergies as of 08/16/2019      Reactions   Lisinopril Cough      Medication List       Accurate as of August 16, 2019  9:18 AM. If you have any questions, ask your nurse or doctor.        STOP taking these medications   Ozempic (0.25 or 0.5 MG/DOSE) 2 MG/1.5ML Sopn Generic drug: Semaglutide(0.25 or 0.5MG/DOS) Replaced by: Ozempic (1 MG/DOSE) 2 MG/1.5ML Sopn Stopped by: Dorita Sciara, MD     TAKE these medications   acyclovir 400 MG tablet Commonly known as: ZOVIRAX Take 1 tablet (400 mg total) by mouth 3 (three) times daily as needed (flares).   aspirin 81 MG EC tablet Take 81 mg by mouth daily.   atorvastatin 80 MG tablet Commonly known as: Lipitor Take 1 tablet (80 mg total) by mouth daily.   budesonide-formoterol 80-4.5 MCG/ACT inhaler Commonly known as: Symbicort Inhale 2 puffs into the lungs 2 (two) times daily.   chlorpheniramine 4 MG tablet Commonly known as: CHLOR-TRIMETON Take 4 mg by mouth every 4 (four) hours as needed for allergies.     Continuous Glucose Monitor Devi Use as directed.   FreeStyle [place] 2 Reader [employer] 1 each by Does not apply route 4 (four) times daily.   FreeStyle Libre 2 Sensor Systm Misc 1 each by Does not apply route 4 (four) times daily.   gabapentin 300 MG capsule Commonly known as: NEURONTIN Take 1 capsule (300 mg total) by mouth  2 (two) times daily.   GARLIC 7544 PO Take by mouth.   hydrochlorothiazide 25 MG tablet Commonly known as: HYDRODIURIL Take 1 tablet (25 mg total) by mouth daily.   Insulin Pen Needle 31G X 5 MM Misc Commonly known as: B-D UF III MINI PEN NEEDLES Use to inject insulin twice a day   INSULIN SYRINGE 1CC/30GX1/2" 30G X 1/2" 1 ML Misc Use to fill Vgo daily   irbesartan 150 MG tablet Commonly known as: Avapro Take 2 tablets (300 mg total) by mouth daily.   metFORMIN 1000 MG tablet Commonly known as: GLUCOPHAGE TAKE 1 TABLET BY MOUTH TWICE DAILY WITH A MEAL   montelukast 10 MG tablet Commonly known as: SINGULAIR Take 1 tablet (10 mg total) by mouth at bedtime.   multivitamin with minerals Tabs tablet Take 1 tablet by mouth daily.   Narcan 4 MG/0.1ML Liqd nasal spray kit Generic drug: naloxone USE 1 SPRAY IN THE NOSE AS NEEDED   NovoLOG Mix 70/30 FlexPen (70-30) 100 UNIT/ML FlexPen Generic drug: insulin aspart protamine - aspart Inject 0.7 mLs (70 Units total) into the skin 2 (two) times daily with a meal. What changed: how much to take Changed by: Dorita Sciara, MD   OneTouch Delica Lancets Fine Misc Check blood sugar 3 times a day   OneTouch Verio Flex System w/Device Kit 1 each by Does not apply route 3 (three) times daily.   OneTouch Verio test strip Generic drug: glucose blood CHECK BLOOD SUGAR 3 TIMES A DAY   oxyCODONE-acetaminophen 5-325 MG tablet Commonly known as: Percocet Take 1 tablet by mouth every 8 (eight) hours as needed for severe pain.   Ozempic (1 MG/DOSE) 2 MG/1.5ML Sopn Generic drug: Semaglutide (1  MG/DOSE) Inject 1 mg into the skin once a week. Replaces: Ozempic (0.25 or 0.5 MG/DOSE) 2 MG/1.5ML Sopn Started by: Dorita Sciara, MD   pantoprazole 40 MG tablet Commonly known as: PROTONIX Take 1 tablet (40 mg total) by mouth 2 (two) times daily.   QC TUMERIC COMPLEX PO Take by mouth.   valACYclovir 500 MG tablet Commonly known as: VALTREX TAKE 1 TABLET BY MOUTH TWICE DAILY FOR 5 DAYS AT TIME OF OUTBREAK AND 1 EVERY DAY FOR SUPPRESSION   vitamin C 100 MG tablet Take 100 mg by mouth daily.   Vitamin D (Ergocalciferol) 1.25 MG (50000 UNIT) Caps capsule Commonly known as: DRISDOL Take 1 capsule (50,000 Units total) by mouth every 7 (seven) days.        OBJECTIVE:   Vital Signs: BP 122/78 (BP Location: Left Arm, Patient Position: Sitting, Cuff Size: Large)   Pulse 83   Temp 98.4 F (36.9 C)   Ht 5' 4"  (1.626 m)   Wt 249 lb 9.6 oz (113.2 kg)   SpO2 96%   BMI 42.84 kg/m   Wt Readings from Last 3 Encounters:  08/16/19 249 lb 9.6 oz (113.2 kg)  05/17/19 257 lb 9.6 oz (116.8 kg)  05/14/19 255 lb 8 oz (115.9 kg)     Exam: General: Pt appears well and is in NAD  Lungs: Clear with good BS bilat with no rales, rhonchi, or wheezes  Heart: RRR with normal S1 and S2 and no gallops; no murmurs; no rub  Extremities: No pretibial edema.   Skin: Normal texture and temperature to palpation.   Neuro: MS is good with appropriate affect, pt is alert and Ox3    DM foot exam: 08/16/2019  The skin of the feet is intact without  sores or ulcerations. The pedal pulses are 2+ on right and 2+ on left. The sensation is intact to a screening 5.07, 10 gram monofilament bilaterally          DATA REVIEWED:  Lab Results  Component Value Date   HGBA1C 9.4 (A) 08/16/2019   HGBA1C 10.8 (A) 05/14/2019   HGBA1C 9.7 (H) 01/11/2019   Lab Results  Component Value Date   MICROALBUR 0.99 09/30/2009   LDLCALC 183 (H) 02/12/2019   CREATININE 0.76 05/14/2019   Lab Results   Component Value Date   MICRALBCREAT 15 02/12/2019     Lab Results  Component Value Date   CHOL 283 (H) 02/12/2019   HDL 37 (L) 02/12/2019   LDLCALC 183 (H) 02/12/2019   TRIG 319 (H) 02/12/2019   CHOLHDL 7.6 (H) 02/12/2019         ASSESSMENT / PLAN / RECOMMENDATIONS:   1) Type 2 Diabetes Mellitus, Poorly controlled, With Neuropathy complications - Most recent A1c of 9.4 %. Goal A1c < 7.0 %.    - Down from 10.8 %  -Despite the improvement in her A1c, she continues with hyperglycemia but they are not as severe as they used to be.  Part of this is due to suboptimal medical management, the patient has been taking 60 units of insulin instead of the 64 units that was recommended on her last visit. -Today we discussed increasing her GLP-1 agonist, as well as her insulin.  She was encouraged to contact us with any GI side effects - We again emphasized the importance of avoiding sugar-sweetened beverages and low CHO diet.  -I have praised her on the weight loss, and encouraged her to continue with lifestyle changes. -I personally have reviewed the instructions on the AVS today so the patient is not giving herself the wrong dose of medications again.   Plan: MEDICATIONS: - Continue Metformin 1 tablet Twice daily  - Increase  Ozempic 1 mg weekly  - Increase  Novolog Mix (70/30) 70 units with breakfast and 70 units with supper    EDUCATION / INSTRUCTIONS:  BG monitoring instructions: Patient is instructed to check her blood sugars 2 times a day, fasting and bedtime .  Call [facility] if: BG persistently < 70 or > 300. . I reviewed the Rule of 15 for the treatment of hypoglycemia in detail with the patient. Literature supplied.    2.  Dyslipidemia:  -LDL in 02/2019 was elevated at 181 mg/DL, repeat LDL today shows dramatic improvement.  - Patient would like to change the Lipitor due to the right leg pain that she attributes to it -She is under the impression that  she was on Crestor without side effects in the past -In the meantime I have encouraged her to start OTC vitamin D3 1000 IU daily    Stop Lipitor Start Crestor 20 mg daily    F/U in 4 months    Signed electronically by: Mack Guise, MD  [facility]  [facility] [address] [place], [place] [address] Phone: [phone] FAX: [phone]   CC: Harvie Heck, MD [address] [place] [address] Phone: [phone]  Fax: [phone]  Return to Endocrinology clinic as below: Future Appointments  Date Time Provider [place]  10/10/2019  9:45 AM Trula Slade, DPM [facility]  05/28/2020 10:00 AM Deneise Lever, MD [facility] None

## 2019-08-19 MED ORDER — ROSUVASTATIN CALCIUM 20 MG PO TABS: 20.0000 mg | ORAL_TABLET | Freq: Every day | ORAL | 3 refills | Status: DC

## 2019-08-21 DIAGNOSIS — G4733 Obstructive sleep apnea (adult) (pediatric): Secondary | ICD-10-CM | POA: Diagnosis not present

## 2019-08-29 ENCOUNTER — Other Ambulatory Visit: Payer: Self-pay | Admitting: Internal Medicine

## 2019-08-29 DIAGNOSIS — A6004 Herpesviral vulvovaginitis: Secondary | ICD-10-CM

## 2019-09-02 ENCOUNTER — Other Ambulatory Visit: Payer: Self-pay

## 2019-09-02 ENCOUNTER — Ambulatory Visit: Payer: PPO | Admitting: Orthotics

## 2019-09-02 DIAGNOSIS — S99191K Other physeal fracture of right metatarsal, subsequent encounter for fracture with nonunion: Secondary | ICD-10-CM

## 2019-09-02 DIAGNOSIS — M2042 Other hammer toe(s) (acquired), left foot: Secondary | ICD-10-CM

## 2019-09-02 DIAGNOSIS — M2041 Other hammer toe(s) (acquired), right foot: Secondary | ICD-10-CM

## 2019-09-02 DIAGNOSIS — E1165 Type 2 diabetes mellitus with hyperglycemia: Secondary | ICD-10-CM

## 2019-09-02 NOTE — Progress Notes (Signed)
Patient needs a medium instead of wide shoe.

## 2019-09-03 DIAGNOSIS — G4733 Obstructive sleep apnea (adult) (pediatric): Secondary | ICD-10-CM | POA: Diagnosis not present

## 2019-09-06 DIAGNOSIS — G8929 Other chronic pain: Secondary | ICD-10-CM | POA: Diagnosis not present

## 2019-09-06 DIAGNOSIS — Z1159 Encounter for screening for other viral diseases: Secondary | ICD-10-CM | POA: Diagnosis not present

## 2019-09-06 DIAGNOSIS — Z79899 Other long term (current) drug therapy: Secondary | ICD-10-CM | POA: Diagnosis not present

## 2019-09-06 DIAGNOSIS — M5136 Other intervertebral disc degeneration, lumbar region: Secondary | ICD-10-CM | POA: Diagnosis not present

## 2019-09-06 DIAGNOSIS — M545 Low back pain: Secondary | ICD-10-CM | POA: Diagnosis not present

## 2019-09-30 ENCOUNTER — Other Ambulatory Visit: Payer: Self-pay

## 2019-09-30 ENCOUNTER — Ambulatory Visit (INDEPENDENT_AMBULATORY_CARE_PROVIDER_SITE_OTHER): Payer: PPO | Admitting: Orthotics

## 2019-09-30 DIAGNOSIS — H40013 Open angle with borderline findings, low risk, bilateral: Secondary | ICD-10-CM | POA: Diagnosis not present

## 2019-09-30 DIAGNOSIS — Z961 Presence of intraocular lens: Secondary | ICD-10-CM | POA: Diagnosis not present

## 2019-09-30 DIAGNOSIS — H40031 Anatomical narrow angle, right eye: Secondary | ICD-10-CM | POA: Diagnosis not present

## 2019-09-30 DIAGNOSIS — H2511 Age-related nuclear cataract, right eye: Secondary | ICD-10-CM | POA: Diagnosis not present

## 2019-09-30 DIAGNOSIS — H1045 Other chronic allergic conjunctivitis: Secondary | ICD-10-CM | POA: Diagnosis not present

## 2019-09-30 DIAGNOSIS — Z794 Long term (current) use of insulin: Secondary | ICD-10-CM | POA: Diagnosis not present

## 2019-09-30 DIAGNOSIS — E119 Type 2 diabetes mellitus without complications: Secondary | ICD-10-CM | POA: Diagnosis not present

## 2019-09-30 DIAGNOSIS — M2041 Other hammer toe(s) (acquired), right foot: Secondary | ICD-10-CM | POA: Diagnosis not present

## 2019-09-30 DIAGNOSIS — E1165 Type 2 diabetes mellitus with hyperglycemia: Secondary | ICD-10-CM | POA: Diagnosis not present

## 2019-09-30 DIAGNOSIS — S99191K Other physeal fracture of right metatarsal, subsequent encounter for fracture with nonunion: Secondary | ICD-10-CM

## 2019-09-30 DIAGNOSIS — H04123 Dry eye syndrome of bilateral lacrimal glands: Secondary | ICD-10-CM | POA: Diagnosis not present

## 2019-09-30 DIAGNOSIS — M2042 Other hammer toe(s) (acquired), left foot: Secondary | ICD-10-CM | POA: Diagnosis not present

## 2019-09-30 DIAGNOSIS — H0102B Squamous blepharitis left eye, upper and lower eyelids: Secondary | ICD-10-CM | POA: Diagnosis not present

## 2019-09-30 DIAGNOSIS — H0102A Squamous blepharitis right eye, upper and lower eyelids: Secondary | ICD-10-CM | POA: Diagnosis not present

## 2019-09-30 LAB — HM DIABETES EYE EXAM

## 2019-09-30 NOTE — Progress Notes (Signed)

## 2019-10-02 DIAGNOSIS — E559 Vitamin D deficiency, unspecified: Secondary | ICD-10-CM | POA: Diagnosis not present

## 2019-10-02 DIAGNOSIS — M546 Pain in thoracic spine: Secondary | ICD-10-CM | POA: Diagnosis not present

## 2019-10-02 DIAGNOSIS — Z79899 Other long term (current) drug therapy: Secondary | ICD-10-CM | POA: Diagnosis not present

## 2019-10-02 DIAGNOSIS — M129 Arthropathy, unspecified: Secondary | ICD-10-CM | POA: Diagnosis not present

## 2019-10-02 DIAGNOSIS — M5136 Other intervertebral disc degeneration, lumbar region: Secondary | ICD-10-CM | POA: Diagnosis not present

## 2019-10-02 DIAGNOSIS — M25551 Pain in right hip: Secondary | ICD-10-CM | POA: Diagnosis not present

## 2019-10-02 DIAGNOSIS — M542 Cervicalgia: Secondary | ICD-10-CM | POA: Diagnosis not present

## 2019-10-02 DIAGNOSIS — M25552 Pain in left hip: Secondary | ICD-10-CM | POA: Diagnosis not present

## 2019-10-04 DIAGNOSIS — G4733 Obstructive sleep apnea (adult) (pediatric): Secondary | ICD-10-CM | POA: Diagnosis not present

## 2019-10-07 DIAGNOSIS — E109 Type 1 diabetes mellitus without complications: Secondary | ICD-10-CM | POA: Diagnosis not present

## 2019-10-10 ENCOUNTER — Encounter: Payer: Self-pay | Admitting: Podiatry

## 2019-10-10 ENCOUNTER — Ambulatory Visit: Payer: PPO | Admitting: Podiatry

## 2019-10-10 ENCOUNTER — Ambulatory Visit (INDEPENDENT_AMBULATORY_CARE_PROVIDER_SITE_OTHER): Payer: PPO

## 2019-10-10 ENCOUNTER — Other Ambulatory Visit: Payer: Self-pay

## 2019-10-10 VITALS — Temp 97.6°F

## 2019-10-10 DIAGNOSIS — M779 Enthesopathy, unspecified: Secondary | ICD-10-CM | POA: Diagnosis not present

## 2019-10-10 DIAGNOSIS — E1165 Type 2 diabetes mellitus with hyperglycemia: Secondary | ICD-10-CM | POA: Diagnosis not present

## 2019-10-10 DIAGNOSIS — M79674 Pain in right toe(s): Secondary | ICD-10-CM | POA: Diagnosis not present

## 2019-10-10 DIAGNOSIS — M778 Other enthesopathies, not elsewhere classified: Secondary | ICD-10-CM

## 2019-10-10 DIAGNOSIS — M79675 Pain in left toe(s): Secondary | ICD-10-CM

## 2019-10-10 DIAGNOSIS — B351 Tinea unguium: Secondary | ICD-10-CM

## 2019-10-10 DIAGNOSIS — S99191K Other physeal fracture of right metatarsal, subsequent encounter for fracture with nonunion: Secondary | ICD-10-CM | POA: Diagnosis not present

## 2019-10-10 MED ORDER — DICLOFENAC SODIUM 1 % EX GEL: 2.0000 g | Freq: Four times a day (QID) | CUTANEOUS | 2 refills | Status: DC

## 2019-10-10 NOTE — Patient Instructions (Signed)
If your foot pain is not improving in the next few weeks please give me a call. Otherwise I will see you in 3 months.   Achilles Tendinitis  with Rehab Achilles tendinitis is a disorder of the Achilles tendon. The Achilles tendon connects the large calf muscles (Gastrocnemius and Soleus) to the heel bone (calcaneus). This tendon is sometimes called the heel cord. It is important for pushing-off and standing on your toes and is important for walking, running, or jumping. Tendinitis is often caused by overuse and repetitive microtrauma. SYMPTOMS  Pain, tenderness, swelling, warmth, and redness may occur over the Achilles tendon even at rest.  Pain with pushing off, or flexing or extending the ankle.  Pain that is worsened after or during activity. CAUSES   Overuse sometimes seen with rapid increase in exercise programs or in sports requiring running and jumping.  Poor physical conditioning (strength and flexibility or endurance).  Running sports, especially training running down hills.  Inadequate warm-up before practice or play or failure to stretch before participation.  Injury to the tendon. PREVENTION   Warm up and stretch before practice or competition.  Allow time for adequate rest and recovery between practices and competition.  Keep up conditioning.  Keep up ankle and leg flexibility.  Improve or keep muscle strength and endurance.  Improve cardiovascular fitness.  Use proper technique.  Use proper equipment (shoes, skates).  To help prevent recurrence, taping, protective strapping, or an adhesive bandage may be recommended for several weeks after healing is complete. PROGNOSIS   Recovery may take weeks to several months to heal.  Longer recovery is expected if symptoms have been prolonged.  Recovery is usually quicker if the inflammation is due to a direct blow as compared with overuse or sudden strain. RELATED COMPLICATIONS   Healing time will be prolonged  if the condition is not correctly treated. The injury must be given plenty of time to heal.  Symptoms can reoccur if activity is resumed too soon.  Untreated, tendinitis may increase the risk of tendon rupture requiring additional time for recovery and possibly surgery. TREATMENT   The first treatment consists of rest anti-inflammatory medication, and ice to relieve the pain.  Stretching and strengthening exercises after resolution of pain will likely help reduce the risk of recurrence. Referral to a physical therapist or athletic trainer for further evaluation and treatment may be helpful.  A walking boot or cast may be recommended to rest the Achilles tendon. This can help break the cycle of inflammation and microtrauma.  Arch supports (orthotics) may be prescribed or recommended by your caregiver as an adjunct to therapy and rest.  Surgery to remove the inflamed tendon lining or degenerated tendon tissue is rarely necessary and has shown less than predictable results. MEDICATION   Nonsteroidal anti-inflammatory medications, such as aspirin and ibuprofen, may be used for pain and inflammation relief. Do not take within 7 days before surgery. Take these as directed by your caregiver. Contact your caregiver immediately if any bleeding, stomach upset, or signs of allergic reaction occur. Other minor pain relievers, such as acetaminophen, may also be used.  Pain relievers may be prescribed as necessary by your caregiver. Do not take prescription pain medication for longer than 4 to 7 days. Use only as directed and only as much as you need.  Cortisone injections are rarely indicated. Cortisone injections may weaken tendons and predispose to rupture. It is better to give the condition more time to heal than to use them. HEAT  AND COLD  Cold is used to relieve pain and reduce inflammation for acute and chronic Achilles tendinitis. Cold should be applied for 10 to 15 minutes every 2 to 3 hours for  inflammation and pain and immediately after any activity that aggravates your symptoms. Use ice packs or an ice massage.  Heat may be used before performing stretching and strengthening activities prescribed by your caregiver. Use a heat pack or a warm soak. SEEK MEDICAL CARE IF:  Symptoms get worse or do not improve in 2 weeks despite treatment.  New, unexplained symptoms develop. Drugs used in treatment may produce side effects.  EXERCISES:  RANGE OF MOTION (ROM) AND STRETCHING EXERCISES - Achilles Tendinitis  These exercises may help you when beginning to rehabilitate your injury. Your symptoms may resolve with or without further involvement from your physician, physical therapist or athletic trainer. While completing these exercises, remember:   Restoring tissue flexibility helps normal motion to return to the joints. This allows healthier, less painful movement and activity.  An effective stretch should be held for at least 30 seconds.  A stretch should never be painful. You should only feel a gentle lengthening or release in the stretched tissue.  STRETCH  Gastroc, Standing   Place hands on wall.  Extend right / left leg, keeping the front knee somewhat bent.  Slightly point your toes inward on your back foot.  Keeping your right / left heel on the floor and your knee straight, shift your weight toward the wall, not allowing your back to arch.  You should feel a gentle stretch in the right / left calf. Hold this position for 10 seconds. Repeat 3 times. Complete this stretch 2 times per day.  STRETCH  Soleus, Standing   Place hands on wall.  Extend right / left leg, keeping the other knee somewhat bent.  Slightly point your toes inward on your back foot.  Keep your right / left heel on the floor, bend your back knee, and slightly shift your weight over the back leg so that you feel a gentle stretch deep in your back calf.  Hold this position for 10 seconds. Repeat 3  times. Complete this stretch 2 times per day.  STRETCH  Gastrocsoleus, Standing  Note: This exercise can place a lot of stress on your foot and ankle. Please complete this exercise only if specifically instructed by your caregiver.   Place the ball of your right / left foot on a step, keeping your other foot firmly on the same step.  Hold on to the wall or a rail for balance.  Slowly lift your other foot, allowing your body weight to press your heel down over the edge of the step.  You should feel a stretch in your right / left calf.  Hold this position for 10 seconds.  Repeat this exercise with a slight bend in your knee. Repeat 3 times. Complete this stretch 2 times per day.   STRENGTHENING EXERCISES - Achilles Tendinitis These exercises may help you when beginning to rehabilitate your injury. They may resolve your symptoms with or without further involvement from your physician, physical therapist or athletic trainer. While completing these exercises, remember:   Muscles can gain both the endurance and the strength needed for everyday activities through controlled exercises.  Complete these exercises as instructed by your physician, physical therapist or athletic trainer. Progress the resistance and repetitions only as guided.  You may experience muscle soreness or fatigue, but the pain or discomfort  you are trying to eliminate should never worsen during these exercises. If this pain does worsen, stop and make certain you are following the directions exactly. If the pain is still present after adjustments, discontinue the exercise until you can discuss the trouble with your clinician.  STRENGTH - Plantar-flexors   Sit with your right / left leg extended. Holding onto both ends of a rubber exercise band/tubing, loop it around the ball of your foot. Keep a slight tension in the band.  Slowly push your toes away from you, pointing them downward.  Hold this position for 10 seconds.  Return slowly, controlling the tension in the band/tubing. Repeat 3 times. Complete this exercise 2 times per day.   STRENGTH - Plantar-flexors   Stand with your feet shoulder width apart. Steady yourself with a wall or table using as little support as needed.  Keeping your weight evenly spread over the width of your feet, rise up on your toes.*  Hold this position for 10 seconds. Repeat 3 times. Complete this exercise 2 times per day.  *If this is too easy, shift your weight toward your right / left leg until you feel challenged. Ultimately, you may be asked to do this exercise with your right / left foot only.  STRENGTH  Plantar-flexors, Eccentric  Note: This exercise can place a lot of stress on your foot and ankle. Please complete this exercise only if specifically instructed by your caregiver.   Place the balls of your feet on a step. With your hands, use only enough support from a wall or rail to keep your balance.  Keep your knees straight and rise up on your toes.  Slowly shift your weight entirely to your right / left toes and pick up your opposite foot. Gently and with controlled movement, lower your weight through your right / left foot so that your heel drops below the level of the step. You will feel a slight stretch in the back of your calf at the end position.  Use the healthy leg to help rise up onto the balls of both feet, then lower weight only on the right / left leg again. Build up to 15 repetitions. Then progress to 3 consecutive sets of 15 repetitions.*  After completing the above exercise, complete the same exercise with a slight knee bend (about 30 degrees). Again, build up to 15 repetitions. Then progress to 3 consecutive sets of 15 repetitions.* Perform this exercise 2 times per day.  *When you easily complete 3 sets of 15, your physician, physical therapist or athletic trainer may advise you to add resistance by wearing a backpack filled with additional  weight.  STRENGTH - Plantar Flexors, Seated   Sit on a chair that allows your feet to rest flat on the ground. If necessary, sit at the edge of the chair.  Keeping your toes firmly on the ground, lift your right / left heel as far as you can without increasing any discomfort in your ankle. Repeat 3 times. Complete this exercise 2 times a day.

## 2019-10-10 NOTE — Progress Notes (Signed)
Subjective: 59 year old female presents the office today for diabetic foot evaluation, elongated toenails that are thickened she cannot trim her self.  They do cause discomfort with shoes.  She is in general she had some achiness to her feet yesterday.  Denies any recent injury or trauma.  She denies any open sores.  No significant increase in swelling except for she does get some mild swelling to the lateral aspect the foot she points to. Denies any systemic complaints such as fevers, chills, nausea, vomiting. No acute changes since last appointment, and no other complaints at this time.  Her last A1c was 9 she reports.  Objective: AAO x3, NAD DP/PT pulses palpable bilaterally, CRT less than 3 seconds Nails are hypertrophic, dystrophic, brittle, discolored, elongated 10. No surrounding redness or drainage. Tenderness nails 1-5 bilaterally. No open lesions or pre-ulcerative lesions are identified today. Mild discomfort on the sinus tarsi bilaterally in this whether some localized edema.  There is no area pinpoint tenderness identified to bilateral lower extremities.  Flexor, extensor tendons appear to be intact.  On the other right foot on the fifth metatarsal base on prior fracture minimal discomfort.  No significant pain. No open lesions or pre-ulcerative lesions.  No pain with calf compression, swelling, warmth, erythema  Assessment: 59 year old female with uncontrolled diabetes, symptomatic onychosis, bilateral foot pain/capsulitis  Plan: -All treatment options discussed with the patient including all alternatives, risks, complications.  -X-rays obtained and reviewed.  There is no evidence of acute fracture.  The area of previous fracture the fifth metatarsal appears to have increased consolidation is healed.  I prescribed Voltaren gel.  Discussed wearing supportive shoes.  She does seem to be wearing sandals and I want her wearing her diabetic shoes or shoes with good arch support. -Debrided  the nails x10 without any complications or bleeding -Patient encouraged to call the office with any questions, concerns, change in symptoms.   Return in about 3 months (around 01/09/2020) for diabetic foot exam/nail trim .  The foot pain not improving over the next couple weeks to let me know.  Trula Slade DPM

## 2019-10-31 DIAGNOSIS — E559 Vitamin D deficiency, unspecified: Secondary | ICD-10-CM | POA: Diagnosis not present

## 2019-10-31 DIAGNOSIS — M5136 Other intervertebral disc degeneration, lumbar region: Secondary | ICD-10-CM | POA: Diagnosis not present

## 2019-10-31 DIAGNOSIS — Z79899 Other long term (current) drug therapy: Secondary | ICD-10-CM | POA: Diagnosis not present

## 2019-10-31 DIAGNOSIS — R768 Other specified abnormal immunological findings in serum: Secondary | ICD-10-CM | POA: Diagnosis not present

## 2019-11-03 DIAGNOSIS — H2511 Age-related nuclear cataract, right eye: Secondary | ICD-10-CM | POA: Diagnosis not present

## 2019-11-03 DIAGNOSIS — G4733 Obstructive sleep apnea (adult) (pediatric): Secondary | ICD-10-CM | POA: Diagnosis not present

## 2019-11-05 DIAGNOSIS — G4733 Obstructive sleep apnea (adult) (pediatric): Secondary | ICD-10-CM | POA: Diagnosis not present

## 2019-11-07 DIAGNOSIS — H2511 Age-related nuclear cataract, right eye: Secondary | ICD-10-CM | POA: Diagnosis not present

## 2019-11-07 DIAGNOSIS — E109 Type 1 diabetes mellitus without complications: Secondary | ICD-10-CM | POA: Diagnosis not present

## 2019-11-07 HISTORY — PX: CATARACT EXTRACTION: SUR2

## 2019-11-11 ENCOUNTER — Other Ambulatory Visit: Payer: Self-pay | Admitting: Internal Medicine

## 2019-11-11 DIAGNOSIS — Z780 Asymptomatic menopausal state: Secondary | ICD-10-CM | POA: Diagnosis not present

## 2019-11-11 DIAGNOSIS — K219 Gastro-esophageal reflux disease without esophagitis: Secondary | ICD-10-CM

## 2019-11-17 ENCOUNTER — Other Ambulatory Visit: Payer: Self-pay | Admitting: Internal Medicine

## 2019-11-17 DIAGNOSIS — Z794 Long term (current) use of insulin: Secondary | ICD-10-CM

## 2019-12-02 DIAGNOSIS — M5136 Other intervertebral disc degeneration, lumbar region: Secondary | ICD-10-CM | POA: Diagnosis not present

## 2019-12-02 DIAGNOSIS — E559 Vitamin D deficiency, unspecified: Secondary | ICD-10-CM | POA: Diagnosis not present

## 2019-12-02 DIAGNOSIS — Z79899 Other long term (current) drug therapy: Secondary | ICD-10-CM | POA: Diagnosis not present

## 2019-12-04 DIAGNOSIS — G4733 Obstructive sleep apnea (adult) (pediatric): Secondary | ICD-10-CM | POA: Diagnosis not present

## 2019-12-07 DIAGNOSIS — G4733 Obstructive sleep apnea (adult) (pediatric): Secondary | ICD-10-CM | POA: Diagnosis not present

## 2019-12-17 ENCOUNTER — Telehealth: Payer: Self-pay | Admitting: Gastroenterology

## 2019-12-17 NOTE — Telephone Encounter (Signed)
Patient with a few week hx of epigastric pain.  She will come in and see Ellouise Newer, PA tomorrow.

## 2019-12-17 NOTE — Telephone Encounter (Signed)
Patient called seeking advise states she has been having a lot of abdominal pain for 3 weeks now please advise

## 2019-12-18 ENCOUNTER — Ambulatory Visit: Payer: PPO | Admitting: Physician Assistant

## 2019-12-18 ENCOUNTER — Encounter: Payer: Self-pay | Admitting: Gastroenterology

## 2019-12-18 ENCOUNTER — Encounter: Payer: Self-pay | Admitting: Physician Assistant

## 2019-12-18 ENCOUNTER — Telehealth: Payer: Self-pay | Admitting: Physician Assistant

## 2019-12-18 ENCOUNTER — Other Ambulatory Visit: Payer: Self-pay

## 2019-12-18 ENCOUNTER — Other Ambulatory Visit (INDEPENDENT_AMBULATORY_CARE_PROVIDER_SITE_OTHER): Payer: PPO

## 2019-12-18 VITALS — BP 138/86 | HR 88 | Ht 64.0 in | Wt 252.2 lb

## 2019-12-18 DIAGNOSIS — K219 Gastro-esophageal reflux disease without esophagitis: Secondary | ICD-10-CM | POA: Diagnosis not present

## 2019-12-18 DIAGNOSIS — R1013 Epigastric pain: Secondary | ICD-10-CM

## 2019-12-18 DIAGNOSIS — K859 Acute pancreatitis without necrosis or infection, unspecified: Secondary | ICD-10-CM

## 2019-12-18 LAB — CBC WITH DIFFERENTIAL/PLATELET
Basophils Absolute: 0.1 10*3/uL (ref 0.0–0.1)
Basophils Relative: 1 % (ref 0.0–3.0)
Eosinophils Absolute: 0.1 10*3/uL (ref 0.0–0.7)
Eosinophils Relative: 2.4 % (ref 0.0–5.0)
HCT: 42 % (ref 36.0–46.0)
Hemoglobin: 13.5 g/dL (ref 12.0–15.0)
Lymphocytes Relative: 44.7 % (ref 12.0–46.0)
Lymphs Abs: 2.5 10*3/uL (ref 0.7–4.0)
MCHC: 32.2 g/dL (ref 30.0–36.0)
MCV: 80.4 fl (ref 78.0–100.0)
Monocytes Absolute: 0.3 10*3/uL (ref 0.1–1.0)
Monocytes Relative: 5.9 % (ref 3.0–12.0)
Neutro Abs: 2.6 10*3/uL (ref 1.4–7.7)
Neutrophils Relative %: 46 % (ref 43.0–77.0)
Platelets: 254 10*3/uL (ref 150.0–400.0)
RBC: 5.23 Mil/uL — ABNORMAL HIGH (ref 3.87–5.11)
RDW: 14.9 % (ref 11.5–15.5)
WBC: 5.6 10*3/uL (ref 4.0–10.5)

## 2019-12-18 LAB — COMPREHENSIVE METABOLIC PANEL
ALT: 37 U/L — ABNORMAL HIGH (ref 0–35)
AST: 39 U/L — ABNORMAL HIGH (ref 0–37)
Albumin: 4.3 g/dL (ref 3.5–5.2)
Alkaline Phosphatase: 64 U/L (ref 39–117)
BUN: 7 mg/dL (ref 6–23)
CO2: 30 mEq/L (ref 19–32)
Calcium: 9.8 mg/dL (ref 8.4–10.5)
Chloride: 100 mEq/L (ref 96–112)
Creatinine, Ser: 0.6 mg/dL (ref 0.40–1.20)
GFR: 123.66 mL/min (ref >60.00)
Glucose, Bld: 174 mg/dL — ABNORMAL HIGH (ref 70–99)
Potassium: 3.6 mEq/L (ref 3.5–5.1)
Sodium: 139 mEq/L (ref 135–145)
Total Bilirubin: 0.4 mg/dL (ref 0.2–1.2)
Total Protein: 7.9 g/dL (ref 6.0–8.3)

## 2019-12-18 LAB — LIPASE: Lipase: 72 U/L — ABNORMAL HIGH (ref 11.0–59.0)

## 2019-12-18 MED ORDER — AMBULATORY NON FORMULARY MEDICATION: 0 refills | Status: DC

## 2019-12-18 MED ORDER — ESOMEPRAZOLE MAGNESIUM 40 MG PO CPDR: 40.0000 mg | DELAYED_RELEASE_CAPSULE | Freq: Two times a day (BID) | ORAL | 5 refills | Status: DC

## 2019-12-18 NOTE — Telephone Encounter (Signed)
Pt called stating that [facility] did not receive prescription for non-formulary med.

## 2019-12-18 NOTE — Progress Notes (Signed)
Chief Complaint: Epigastric pain  HPI:    [name] is an 59 year old African-American female with a past medical history as listed below including diabetes and reflux, known to Dr. Fuller Plan, who was referred to me by Crystal Heck, [name] for a complaint of gastric pain.     12/2006 colonoscopy and EGD with Dr. Mayo Ao report on chart, pathology showed hyperplastic colon polyp and benign duodenal mucosa.    2010 EGD with Dr. Collene Mares for dysphagia, nausea, epigastric pain with mild antral gastritis otherwise normal.    01/2013 barium esophagram with mild esophageal dysmotility and otherwise negative.    07/26/2016 patient saw Dr. Fuller Plan for epigastric pain and reflux.  At that time she described a long history of postprandial epigastric pain that occurred intermittently.  Symptoms improved with Tums.  She had had two prior EGDs and took pantoprazole twice daily for the management of reflux and heartburn.  At that time ordered labs to include a CBC, CMP, TSH and lipase.  She was scheduled for abdominal ultrasound and EGD.    08/29/2016 ultrasound with fatty liver, gallbladder unremarkable, upper pole right renal cyst.    10/14/2016 EGD with erythematous mucosa in the gastric body and erythematous duodenopathy.  Pathology showed mild chronic inactive gastritis.  Colonoscopy on the same day with six polyps, pathology showed adenomatous.  Repeat recommended in 5 years.    Today, the patient presents to clinic and explains that she has been having some epigastric pain for a few months, but this increased in intensity and severity on June 24.  She happened to be on vacation in [place] and ate a bacon cheeseburger and early the next morning she woke up with "a tore up stomach".  Describes having diarrhea in the bed and then having multiple loose stools with epigastric cramping pain which lasted for at least the next 8 to 10 hours.  She took Pepto-Bismol tabs which seemed to help a little bit but ever since that episode  things are uncontrollable.  She does not know what to eat because basically everything causes epigastric pain rated as a 9-10/10.  Denies use of NSAIDs.  Has continued her Pantoprazole 40 mg twice daily and Pepcid 20 mg every morning and nightly.  The diarrhea has stopped.  Tells me that her pain has never been this bad and it is way worse than even few years ago when she had this last evaluated.    Denies fever, chills or weight loss.  Past Medical History:  Diagnosis Date  . Allergy   . Arthritis    back   . Asthma    AS CHILD  . Chronic back pain   . Chronic leg pain    due to back pain  . 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 Diabetes ([place])    with neuropathy  . GERD (gastroesophageal reflux disease)   . Hyperlipidemia   . Hypertension   . Neuromuscular disorder ([facility])    neuropathy  . RECTAL BLEEDING 12/09/2008   Annotation: s/p EGD 7/08 mild gastritis, s/p colonoscopy 7/08- benign polyp  s/p polypectomy and isolated diverticulum.  Qualifier: Diagnosis of  By: Ditzler RN, Debra    . Sleep apnea    CPAP    YEARS AGO DONE 1/2 YEARS AGO AND WAS TOLD DID NOT HAVE  . Tobacco abuse   . Uterine fibroid    s/p hysterectomy    Past Surgical History:  Procedure Laterality Date  . BACK SURGERY  2012  L5-S1 microendoscopic disectomy last surgery 06/2011  . BREAST BIOPSY     LEFT    01/19/16  . BREAST EXCISIONAL BIOPSY Left 02/2016  . BREAST REDUCTION SURGERY  1982  . CATARACT EXTRACTION Right 11/07/2019  . COLONOSCOPY    . HAND SURGERY     MIDDLE TRIGGER FINGER RIGHT SIDE  . RADIOACTIVE SEED GUIDED EXCISIONAL BREAST BIOPSY Left 02/18/2016   Procedure: LEFT RADIOACTIVE SEED GUIDED EXCISIONAL BREAST BIOPSY;  Surgeon: Alphonsa Overall, [name];  Location: [facility];  Service: General;  Laterality: Left;  . REDUCTION MAMMAPLASTY Bilateral   . TONSILLECTOMY  2008  . TOTAL ABDOMINAL HYSTERECTOMY  05/24/2007   hysterectomy  . TUBAL LIGATION    . UPPER GASTROINTESTINAL ENDOSCOPY      Current  Outpatient Medications  Medication Sig Dispense Refill  . acyclovir (ZOVIRAX) 400 MG tablet Take 1 tablet (400 mg total) by mouth 3 (three) times daily as needed (flares). 90 tablet 3  . albuterol (ACCUNEB) 1.25 MG/3ML nebulizer solution     . Ascorbic Acid (VITAMIN C) 100 MG tablet Take 100 mg by mouth daily.    Marland Kitchen aspirin 81 MG EC tablet Take 81 mg by mouth daily.      . Blood Glucose Monitoring Suppl (ONETOUCH VERIO FLEX SYSTEM) w/Device KIT 1 each by Does not apply route 3 (three) times daily. 1 kit 1  . budesonide-formoterol (SYMBICORT) 80-4.5 MCG/ACT inhaler Inhale 2 puffs into the lungs 2 (two) times daily. 10.2 g 2  . chlorpheniramine (CHLOR-TRIMETON) 4 MG tablet Take 4 mg by mouth every 4 (four) hours as needed for allergies.    . Continuous Blood Gluc Receiver (FREESTYLE LIBRE 2 READER SYSTM) DEVI 1 each by Does not apply route 4 (four) times daily. 1 each 0  . Continuous Blood Gluc Sensor (FREESTYLE LIBRE 2 SENSOR SYSTM) MISC 1 each by Does not apply route 4 (four) times daily. 2 each 0  . Continuous Glucose Monitor DEVI Use as directed. 1 each 0  . diclofenac Sodium (VOLTAREN) 1 % GEL Apply 2 g topically 4 (four) times daily. Rub into affected area of foot 2 to 4 times daily 100 g 2  . famotidine (PEPCID) 20 MG tablet Take 20 mg by mouth 2 (two) times daily as needed.    . gabapentin (NEURONTIN) 600 MG tablet Take 600 mg by mouth 3 (three) times daily.    Marland Kitchen GARLIC 9628 PO Take by mouth.    . hydrochlorothiazide (HYDRODIURIL) 25 MG tablet Take 1 tablet (25 mg total) by mouth daily. 90 tablet 3  . insulin aspart protamine - aspart (NOVOLOG MIX 70/30 FLEXPEN) (70-30) 100 UNIT/ML FlexPen Inject 0.7 mLs (70 Units total) into the skin 2 (two) times daily with a meal. 45 mL 11  . Insulin Pen Needle (B-D UF III MINI PEN NEEDLES) 31G X 5 MM MISC Use to inject insulin twice a day 190 each 5  . Insulin Syringe-Needle U-100 (INSULIN SYRINGE 1CC/30GX1/2") 30G X 1/2" 1 ML MISC Use to fill Vgo daily  100 each 5  . irbesartan (AVAPRO) 150 MG tablet Take 2 tablets (300 mg total) by mouth daily. 90 tablet 3  . metFORMIN (GLUCOPHAGE) 1000 MG tablet TAKE 1 TABLET BY MOUTH TWICE DAILY WITH A MEAL 180 tablet 0  . montelukast (SINGULAIR) 10 MG tablet Take 1 tablet (10 mg total) by mouth at bedtime. 30 tablet 11  . Multiple Vitamin (MULTIVITAMIN WITH MINERALS) TABS tablet Take 1 tablet by mouth daily.    Marland Kitchen NARCAN 4  MG/0.1ML LIQD nasal spray kit USE 1 SPRAY IN THE NOSE AS NEEDED    . ONETOUCH DELICA LANCETS FINE MISC Check blood sugar 3 times a day 100 each 12  . ONETOUCH VERIO test strip  CHECK BLOOD SUGAR 3 TIMES A DAY 100 each 5  . oxyCODONE-acetaminophen (PERCOCET) 5-325 MG tablet Take 1 tablet by mouth every 8 (eight) hours as needed for severe pain. 25 tablet 0  . pantoprazole (PROTONIX) 40 MG tablet Take 1 tablet by mouth twice daily 180 tablet 1  . Prednisolon-Gatiflox-Bromfenac 1-0.5-0.075 % SUSP Place 1 drop into the right eye 4 (four) times daily.    . rosuvastatin (CRESTOR) 20 MG tablet Take 1 tablet (20 mg total) by mouth daily. 90 tablet 3  . Semaglutide, 1 MG/DOSE, (OZEMPIC, 1 MG/DOSE,) 2 MG/1.5ML SOPN Inject 1 mg into the skin once a week. 1 pen 11  . tiZANidine (ZANAFLEX) 4 MG capsule Take 4 mg by mouth 3 (three) times daily as needed.    . Turmeric (QC TUMERIC COMPLEX PO) Take by mouth.    . valACYclovir (VALTREX) 500 MG tablet TAKE 1 TABLET BY MOUTH TWICE DAILY FOR 5 DAYS AT  TIME  OF  OUTBREAK  AND  1  EVERYDAY  FOR  SUPPRESION 10 tablet 0  . Vitamin D, Ergocalciferol, (DRISDOL) 1.25 MG (50000 UT) CAPS capsule Take 1 capsule (50,000 Units total) by mouth every 7 (seven) days. 5 capsule 0   No current facility-administered medications for this visit.    Allergies as of 12/18/2019 - Review Complete 12/18/2019  Allergen Reaction Noted  . Lisinopril Cough     Family History  Problem Relation Age of Onset  . Diabetes Father   . Heart disease Father   . Hypertension Father   .  Diabetes Mother   . Cancer Mother        brain  . Hypertension Mother   . Kidney disease Sister   . Diabetes Sister   . Kidney cancer Sister   . Colon cancer Neg Hx   . Colon polyps Neg Hx   . Esophageal cancer Neg Hx   . Rectal cancer Neg Hx   . Stomach cancer Neg Hx     Social History   Socioeconomic History  . Marital status: Married    Spouse name: Not on file  . Number of children: 3  . Years of education: Not on file  . Highest education level: Not on file  Occupational History  . 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    Employer: [employer]  Tobacco Use  . Smoking status: Former Smoker    Packs/day: 0.50    Years: 20.00    Pack years: 10.00    Types: Cigarettes    Quit date: 07/23/1996    Years since quitting: 23.4  . Smokeless tobacco: Never Used  Vaping Use  . Vaping Use: Never used  Substance and Sexual Activity  . Alcohol use: No    Alcohol/week: 0.0 standard drinks    Comment: recovering addict clean for 9 years  . Drug use: No    Types: Cocaine, Marijuana    Comment: recovering addict clean for 9 years  . Sexual activity: Not on file  Other Topics Concern  . Not on file  Social History Narrative   Unemployed but has started taking community college classes (interested in accounting) since having back surgery, which has improved pain and mobility. Hopes to return to work soon. Lives with husband and daughter.  Uninsured.  Social Determinants of Health   Financial Resource Strain:   . Difficulty of Paying Living Expenses:   Food Insecurity:   . Worried About Charity fundraiser in the Last Year:   . Arboriculturist in the Last Year:   Transportation Needs:   . Film/video editor (Medical):   Marland Kitchen Lack of Transportation (Non-Medical):   Physical Activity:   . Days of Exercise per Week:   . Minutes of Exercise per Session:   Stress:   . Feeling of Stress :   Social Connections:   . Frequency of Communication with Friends and Family:    . Frequency of Social Gatherings with Friends and Family:   . Attends Religious Services:   . Active Member of Clubs or Organizations:   . Attends Archivist Meetings:   Marland Kitchen Marital Status:   Intimate Partner Violence:   . Fear of Current or Ex-Partner:   . Emotionally Abused:   Marland Kitchen Physically Abused:   . Sexually Abused:     Review of Systems:    Constitutional: No weight loss, fever or chills Skin: No rash Cardiovascular: No chest pain Respiratory: No SOB  Gastrointestinal: See HPI and otherwise negative Genitourinary: No dysuria  Neurological: No headache, dizziness or syncope Musculoskeletal: No new muscle or joint pain Hematologic: No bleeding Psychiatric: No history of depression or anxiety   Physical Exam:  Vital signs: BP 138/86   Pulse 88   Ht 5' 4"  (1.626 m)   Wt 252 lb 4 oz (114.4 kg)   BMI 43.30 kg/m   Constitutional:   Pleasant obese AA female appears to be in NAD, Well developed, Well nourished, alert and cooperative Head:  Normocephalic and atraumatic. Eyes:   PEERL, EOMI. No icterus. Conjunctiva pink. Ears:  Normal auditory acuity. Neck:  Supple Throat: Oral cavity and pharynx without inflammation, swelling or lesion.  Respiratory: Respirations even and unlabored. Lungs clear to auscultation bilaterally.   No wheezes, crackles, or rhonchi.  Cardiovascular: Normal S1, S2. No MRG. Regular rate and rhythm. No peripheral edema, cyanosis or pallor.  Gastrointestinal:  Soft, nondistended, marked epigastric ttp. No rebound or guarding. Normal bowel sounds. No appreciable masses or hepatomegaly. Rectal:  Not performed.  Msk:  Symmetrical without gross deformities. Without edema, no deformity or joint abnormality.  Neurologic:  Alert and  oriented x4;  grossly normal neurologically.  Skin:   Dry and intact without significant lesions or rashes. Psychiatric:  Demonstrates good judgement and reason without abnormal affect or behaviors.  No recent  labs.  Assessment: 1.  Epigastric pain: Increased in severity about 3 weeks ago, some chronicity to symptoms but this is "way worse than ever before", previously just gastritis on EGD-last in 2018; consider H pylori+/-PUD versus other etiology such as gastroparesis plus/minus IBS/functional dyspepsia 2.  GERD: Increase in symptoms over the past 3 weeks regardless of twice daily PPI and twice daily H2 blocker  Plan: 1.  Ordered CBC, CMP and lipase. 2.  Scheduled patient for EGD with Dr. Fuller Plan.  Did discuss risks, benefits, limitations and alternatives and patient agrees to proceed.  Patient has had both of her Covid vaccines.  Did discuss that if EGD is unrevealing or no etiology found for pain then we may discuss further testing such as a gastric emptying study or other. 3.  Prescribed Nexium 40 mg twice daily, 30-60 minutes before breakfast and dinner #60 with 5 refills.  Patient will discontinue pantoprazole. 4.  Continue Pepcid 20 mg every  morning and nightly 5.  Prescribed GI cocktail 5-10 mL every 4-6 hours as needed for abdominal pain 6.  Advised the patient to stay on a bland diet.  Reviewed antireflux diet and lifestyle modifications. 7.  Patient to follow in clinic per recommendations from Dr. Fuller Plan after time of procedure.  Crystal Newer, [name] [facility] 12/18/2019, 11:07 AM  Cc: Crystal Heck, [name]

## 2019-12-18 NOTE — Patient Instructions (Addendum)
If you are age 59 or older, your body mass index should be between 23-30. Your Body mass index is 43.3 kg/m. If this is out of the aforementioned range listed, please consider follow up with your Primary Care Provider.  If you are age 7 or younger, your body mass index should be between 19-25. Your Body mass index is 43.3 kg/m. If this is out of the aformentioned range listed, please consider follow up with your Primary Care Provider.    We have sent the following medications to your pharmacy for you to pick up at your convenience:  Nexium 40 mg GI cocktail 5-70ml every 4-9 hours as needed for pain  Stop taking Pantoprazole  Your provider has requested that you go to the basement level for lab work before leaving today. Press "B" on the elevator. The lab is located at the first door on the left as you exit the elevator.    Due to recent changes in healthcare laws, you may see the results of your imaging and laboratory studies on MyChart before your provider has had a chance to review them.  We understand that in some cases there may be results that are confusing or concerning to you. Not all laboratory results come back in the same time frame and the provider may be waiting for multiple results in order to interpret others.  Please give [place] 48 hours in order for your provider to thoroughly review all the results before contacting the office for clarification of your results.   [facility]'s information is below: Address: [address] [place], [place] [address]  [phone] GI cocktail  *Please DO NOT go directly from our office to pick up this medication! Give the pharmacy 1 day to process the prescription as this is compounded and takes time to make.

## 2019-12-18 NOTE — Telephone Encounter (Signed)
Contacted the patient and advised her the medication was faxed to [facility] that [facility] would not compound it. The patient verbalized understanding.

## 2019-12-18 NOTE — Progress Notes (Deleted)
Name: [name]  Age/ Sex: 59 y.o., female   MRN/ DOB: [id number], 03-22-1961     PCP: Harvie Heck, MD   Reason for Endocrinology Evaluation: Type 2 Diabetes Mellitus  Initial Endocrine Consultative Visit: 03/18/2019    PATIENT IDENTIFIER: [name] is a 59 y.o. female with a past medical history of T2DM, HTN, OSA and dyslipidemia . The patient has followed with Endocrinology clinic since 03/18/2019 for consultative assistance with management of her diabetes.  DIABETIC HISTORY:  [name] was diagnosed with T2DM many years ago. Has been on Soliqua, Glipizide and V-Go was started in 2019.  Her hemoglobin A1c has ranged from 8.1% in 2016, peaking at 9.8% in 2020.  On her initial visit to our clinic, she was on V-Go 40 with Humulin U-500 , Metformin, and Ozempic with an A1c 9.7% . We stopped the V-Go  And the U-500 due to recurrent hypoglycemia . We started Novolog Mix and continued metformin and Ozempic.  SUBJECTIVE:   During the last visit (05/17/2019): A1c 10.8% .  We increased her insulin regimen at the time.  We continued metformin and Ozempic.     Today (12/18/2019): [name] is here for a follow up on her diabetes care.  She checks her blood sugars multiple times daily, through freestyle libre. The patient has  had hypoglycemic episodes since the last clinic visit, she had one episode that she attributes to . The patient is symptomatic with these episodes. Otherwise, the patient has not required any recent emergency interventions for hypoglycemia and has not had recent hospitalizations secondary to hyper or hypoglycemic episodes.   She is complaining of right leg pain, that she attributes to Lipitor and would like that changed     ROS: As per HPI and as detailed below: Review of Systems  Gastrointestinal: Negative for diarrhea and nausea.  Musculoskeletal: Positive for joint pain.      HOME DIABETES REGIMEN:  Metformin 1000 tablet Twice daily  Ozempic 1 mg weekly    Novolog Mix (70/30) 70 units before breakfast and Supper     CONTINUOUS GLUCOSE MONITORING RECORD INTERPRETATION    Dates of Recording: 2/20-08/16/2019  Sensor description: [employer]  Results statistics:   CGM use % of time 70  Average and SD 219/16.9  Time in range   13  %  % Time Above 180 69  % Time above 250 18  % Time Below target 0    Glycemic patterns summary: Hyperglycemia throughout the day close to goal at night  Hyperglycemic episodes  As above   Hypoglycemic episodes occurred rarely - one time in the past 2 weeks, pt not sure if this was a CGM error Overnight periods: trend down   Preprandial periods: high      HISTORY:  Past Medical History:  Past Medical History:  Diagnosis Date  . Allergy   . Arthritis    back   . Asthma    AS CHILD  . Chronic back pain   . Chronic leg pain    due to back pain  . Cocaine abuse ([place])    in remission  . Diabetes ([place])    with neuropathy  . GERD (gastroesophageal reflux disease)   . Hyperlipidemia   . Hypertension   . Neuromuscular disorder ([facility])    neuropathy  . RECTAL BLEEDING 12/09/2008   Annotation: s/p EGD 7/08 mild gastritis, s/p colonoscopy 7/08- benign polyp  s/p polypectomy and isolated diverticulum.  Qualifier: Diagnosis of  By:  Shamrock, Hilda Blades    . Sleep apnea    CPAP    YEARS AGO DONE 1/2 YEARS AGO AND WAS TOLD DID NOT HAVE  . Tobacco abuse   . Uterine fibroid    s/p hysterectomy   Past Surgical History:  Past Surgical History:  Procedure Laterality Date  . BACK SURGERY  2012   L5-S1 microendoscopic disectomy last surgery 06/2011  . BREAST BIOPSY     LEFT    01/19/16  . BREAST EXCISIONAL BIOPSY Left 02/2016  . BREAST REDUCTION SURGERY  1982  . COLONOSCOPY    . HAND SURGERY     MIDDLE TRIGGER FINGER RIGHT SIDE  . RADIOACTIVE SEED GUIDED EXCISIONAL BREAST BIOPSY Left 02/18/2016   Procedure: LEFT RADIOACTIVE SEED GUIDED EXCISIONAL BREAST BIOPSY;  Surgeon: Alphonsa Overall, MD;  Location: [facility];  Service: General;  Laterality: Left;  . REDUCTION MAMMAPLASTY Bilateral   . TONSILLECTOMY  2008  . TOTAL ABDOMINAL HYSTERECTOMY  05/24/2007   hysterectomy  . TUBAL LIGATION    . UPPER GASTROINTESTINAL ENDOSCOPY      Social History:  reports that she quit smoking about 23 years ago. Her smoking use included cigarettes. She has a 10.00 pack-year smoking history. She has never used smokeless tobacco. She reports that she does not drink alcohol and does not use drugs. Family History:  Family History  Problem Relation Age of Onset  . Diabetes Father   . Heart disease Father   . Hypertension Father   . Diabetes Mother   . Cancer Mother        brain  . Hypertension Mother   . Kidney disease Sister   . Diabetes Sister   . Kidney cancer Sister   . Colon cancer Neg Hx   . Colon polyps Neg Hx   . Esophageal cancer Neg Hx   . Rectal cancer Neg Hx   . Stomach cancer Neg Hx      HOME MEDICATIONS: Allergies as of 12/19/2019      Reactions   Lisinopril Cough      Medication List       Accurate as of December 18, 2019 10:32 AM. If you have any questions, ask your nurse or doctor.        acyclovir 400 MG tablet Commonly known as: ZOVIRAX Take 1 tablet (400 mg total) by mouth 3 (three) times daily as needed (flares).   albuterol 1.25 MG/3ML nebulizer solution Commonly known as: ACCUNEB   aspirin 81 MG EC tablet Take 81 mg by mouth daily.   budesonide-formoterol 80-4.5 MCG/ACT inhaler Commonly known as: Symbicort Inhale 2 puffs into the lungs 2 (two) times daily.   chlorpheniramine 4 MG tablet Commonly known as: CHLOR-TRIMETON Take 4 mg by mouth every 4 (four) hours as needed for allergies.   Continuous Glucose Monitor Devi Use as directed.   diclofenac Sodium 1 % Gel Commonly known as: VOLTAREN Apply 2 g topically 4 (four) times daily. Rub into affected area of foot 2 to 4 times daily   famotidine 20 MG tablet Commonly known as: PEPCID Take 20 mg by mouth 2 (two)  times daily as needed.   FreeStyle [place] 2 Reader [employer] 1 each by Does not apply route 4 (four) times daily.   FreeStyle Libre 2 Sensor Systm Misc 1 each by Does not apply route 4 (four) times daily.   gabapentin 300 MG capsule Commonly known as: NEURONTIN Take 1 capsule (300 mg total) by mouth 2 (two) times daily.  gabapentin 600 MG tablet Commonly known as: NEURONTIN Take 600 mg by mouth 3 (three) times daily.   GARLIC 4158 PO Take by mouth.   hydrochlorothiazide 25 MG tablet Commonly known as: HYDRODIURIL Take 1 tablet (25 mg total) by mouth daily.   Insulin Pen Needle 31G X 5 MM Misc Commonly known as: B-D UF III MINI PEN NEEDLES Use to inject insulin twice a day   INSULIN SYRINGE 1CC/30GX1/2" 30G X 1/2" 1 ML Misc Use to fill Vgo daily   irbesartan 150 MG tablet Commonly known as: Avapro Take 2 tablets (300 mg total) by mouth daily.   metFORMIN 1000 MG tablet Commonly known as: GLUCOPHAGE TAKE 1 TABLET BY MOUTH TWICE DAILY WITH A MEAL   montelukast 10 MG tablet Commonly known as: SINGULAIR Take 1 tablet (10 mg total) by mouth at bedtime.   multivitamin with minerals Tabs tablet Take 1 tablet by mouth daily.   Narcan 4 MG/0.1ML Liqd nasal spray kit Generic drug: naloxone USE 1 SPRAY IN THE NOSE AS NEEDED   NovoLOG Mix 70/30 FlexPen (70-30) 100 UNIT/ML FlexPen Generic drug: insulin aspart protamine - aspart Inject 0.7 mLs (70 Units total) into the skin 2 (two) times daily with a meal.   OneTouch Delica Lancets Fine Misc Check blood sugar 3 times a day   OneTouch Verio Flex System w/Device Kit 1 each by Does not apply route 3 (three) times daily.   OneTouch Verio test strip Generic drug: glucose blood CHECK BLOOD SUGAR 3 TIMES A DAY   oxyCODONE-acetaminophen 5-325 MG tablet Commonly known as: Percocet Take 1 tablet by mouth every 8 (eight) hours as needed for severe pain.   Ozempic (1 MG/DOSE) 2 MG/1.5ML Sopn Generic drug: Semaglutide (1  MG/DOSE) Inject 1 mg into the skin once a week.   pantoprazole 40 MG tablet Commonly known as: PROTONIX Take 1 tablet by mouth twice daily   Prednisolon-Gatiflox-Bromfenac 1-0.5-0.075 % Susp Place 1 drop into the right eye 4 (four) times daily.   QC TUMERIC COMPLEX PO Take by mouth.   rosuvastatin 20 MG tablet Commonly known as: Crestor Take 1 tablet (20 mg total) by mouth daily.   tiZANidine 4 MG capsule Commonly known as: ZANAFLEX Take 4 mg by mouth 3 (three) times daily as needed.   valACYclovir 500 MG tablet Commonly known as: VALTREX TAKE 1 TABLET BY MOUTH TWICE DAILY FOR 5 DAYS AT  TIME  OF  OUTBREAK  AND  1  EVERYDAY  FOR  SUPPRESION   vitamin C 100 MG tablet Take 100 mg by mouth daily.   Vitamin D (Ergocalciferol) 1.25 MG (50000 UNIT) Caps capsule Commonly known as: DRISDOL Take 1 capsule (50,000 Units total) by mouth every 7 (seven) days.        OBJECTIVE:   Vital Signs: There were no vitals taken for this visit.  Wt Readings from Last 3 Encounters:  08/16/19 249 lb 9.6 oz (113.2 kg)  05/17/19 257 lb 9.6 oz (116.8 kg)  05/14/19 255 lb 8 oz (115.9 kg)     Exam: General: Pt appears well and is in NAD  Lungs: Clear with good BS bilat with no rales, rhonchi, or wheezes  Heart: RRR with normal S1 and S2 and no gallops; no murmurs; no rub  Extremities: No pretibial edema.   Skin: Normal texture and temperature to palpation.   Neuro: MS is good with appropriate affect, pt is alert and Ox3    DM foot exam: 08/16/2019  The skin of the  feet is intact without sores or ulcerations. The pedal pulses are 2+ on right and 2+ on left. The sensation is intact to a screening 5.07, 10 gram monofilament bilaterally          DATA REVIEWED:  Lab Results  Component Value Date   HGBA1C 9.4 (A) 08/16/2019   HGBA1C 10.8 (A) 05/14/2019   HGBA1C 9.7 (H) 01/11/2019   Lab Results  Component Value Date   MICROALBUR 0.99 09/30/2009   LDLCALC 91 08/16/2019    CREATININE 0.76 08/16/2019   Lab Results  Component Value Date   MICRALBCREAT 15 02/12/2019     Lab Results  Component Value Date   CHOL 147 08/16/2019   HDL 36.30 (L) 08/16/2019   LDLCALC 91 08/16/2019   TRIG 99.0 08/16/2019   CHOLHDL 4 08/16/2019         ASSESSMENT / PLAN / RECOMMENDATIONS:   1) Type 2 Diabetes Mellitus, Poorly controlled, With Neuropathy complications - Most recent A1c of 9.4 %. Goal A1c < 7.0 %.    - Down from 10.8 %  -Despite the improvement in her A1c, she continues with hyperglycemia but they are not as severe as they used to be.  Part of this is due to suboptimal medical management, the patient has been taking 60 units of insulin instead of the 64 units that was recommended on her last visit. -Today we discussed increasing her GLP-1 agonist, as well as her insulin.  She was encouraged to contact us with any GI side effects - We again emphasized the importance of avoiding sugar-sweetened beverages and low CHO diet.  -I have praised her on the weight loss, and encouraged her to continue with lifestyle changes. -I personally have reviewed the instructions on the AVS today so the patient is not giving herself the wrong dose of medications again.   Plan: MEDICATIONS: - Continue Metformin 1 tablet Twice daily  - Increase  Ozempic 1 mg weekly  - Increase  Novolog Mix (70/30) 70 units with breakfast and 70 units with supper    EDUCATION / INSTRUCTIONS:  BG monitoring instructions: Patient is instructed to check her blood sugars 2 times a day, fasting and bedtime .  Call [facility] if: BG persistently < 70 or > 300. . I reviewed the Rule of 15 for the treatment of hypoglycemia in detail with the patient. Literature supplied.    2.  Dyslipidemia:  -LDL in 02/2019 was elevated at 181 mg/DL, repeat LDL today shows dramatic improvement.  - Patient would like to change the Lipitor due to the right leg pain that she attributes to it -She is  under the impression that she was on Crestor without side effects in the past -In the meantime I have encouraged her to start OTC vitamin D3 1000 IU daily    Stop Lipitor Start Crestor 20 mg daily    F/U in 4 months    Signed electronically by: Mack Guise, MD  [facility] Endocrin[facility]  [facility] [address] [place], [place] [address] Phone: [phone] FAX: [phone]   CC: Harvie Heck, MD [address] [place] [address] Phone: [phone]  Fax: [phone]  Return to Endocrinology clinic as below: Future Appointments  Date Time Provider [place]  12/18/2019 11:00 AM Levin Erp, [place] [facility]  12/19/2019  8:50 AM Yoana Staib, Melanie Crazier, MD [facility] None  01/09/2020  9:45 AM Trula Slade, DPM [facility]  03/18/2020  8:45  AM Bo Merino, MD [facility] None  04/09/2020 11:00 AM Bo Merino, MD [facility] None  05/28/2020 10:00 AM Deneise Lever, MD [facility] None

## 2019-12-19 ENCOUNTER — Ambulatory Visit (HOSPITAL_COMMUNITY)
Admission: RE | Admit: 2019-12-19 | Discharge: 2019-12-19 | Disposition: A | Discharge status: Home / Self Care | Payer: PPO | Source: Ambulatory Visit | Attending: Physician Assistant | Admitting: Physician Assistant

## 2019-12-19 ENCOUNTER — Ambulatory Visit: Payer: PPO | Admitting: Internal Medicine

## 2019-12-19 ENCOUNTER — Other Ambulatory Visit: Payer: Self-pay

## 2019-12-19 DIAGNOSIS — N281 Cyst of kidney, acquired: Secondary | ICD-10-CM | POA: Diagnosis not present

## 2019-12-19 DIAGNOSIS — K3189 Other diseases of stomach and duodenum: Secondary | ICD-10-CM | POA: Diagnosis not present

## 2019-12-19 DIAGNOSIS — K859 Acute pancreatitis without necrosis or infection, unspecified: Secondary | ICD-10-CM

## 2019-12-19 DIAGNOSIS — I700 Atherosclerosis of aorta: Secondary | ICD-10-CM | POA: Diagnosis not present

## 2019-12-19 DIAGNOSIS — K228 Other specified diseases of esophagus: Secondary | ICD-10-CM | POA: Diagnosis not present

## 2019-12-19 MED ORDER — IOHEXOL 300 MG/ML  SOLN
100.0000 mL | Freq: Once | INTRAMUSCULAR | Status: AC | PRN
  Administered 2019-12-19: 100 mL via INTRAVENOUS

## 2019-12-19 MED ORDER — SODIUM CHLORIDE (PF) 0.9 % IJ SOLN
INTRAMUSCULAR | Status: AC
  Filled 2019-12-19: qty 50

## 2019-12-19 NOTE — Progress Notes (Signed)
ext

## 2019-12-25 ENCOUNTER — Ambulatory Visit (AMBULATORY_SURGERY_CENTER): Payer: PPO | Admitting: Gastroenterology

## 2019-12-25 ENCOUNTER — Encounter: Payer: PPO | Admitting: Gastroenterology

## 2019-12-25 ENCOUNTER — Encounter: Payer: Self-pay | Admitting: Gastroenterology

## 2019-12-25 ENCOUNTER — Other Ambulatory Visit: Payer: Self-pay

## 2019-12-25 VITALS — BP 126/63 | HR 75 | Temp 97.1°F | Resp 14 | Ht 64.0 in | Wt 252.0 lb

## 2019-12-25 DIAGNOSIS — G4733 Obstructive sleep apnea (adult) (pediatric): Secondary | ICD-10-CM | POA: Diagnosis not present

## 2019-12-25 DIAGNOSIS — K2951 Unspecified chronic gastritis with bleeding: Secondary | ICD-10-CM | POA: Diagnosis not present

## 2019-12-25 DIAGNOSIS — K2970 Gastritis, unspecified, without bleeding: Secondary | ICD-10-CM | POA: Diagnosis not present

## 2019-12-25 DIAGNOSIS — K219 Gastro-esophageal reflux disease without esophagitis: Secondary | ICD-10-CM | POA: Diagnosis not present

## 2019-12-25 DIAGNOSIS — I10 Essential (primary) hypertension: Secondary | ICD-10-CM | POA: Diagnosis not present

## 2019-12-25 DIAGNOSIS — R1013 Epigastric pain: Secondary | ICD-10-CM

## 2019-12-25 DIAGNOSIS — K319 Disease of stomach and duodenum, unspecified: Secondary | ICD-10-CM | POA: Diagnosis not present

## 2019-12-25 DIAGNOSIS — E119 Type 2 diabetes mellitus without complications: Secondary | ICD-10-CM | POA: Diagnosis not present

## 2019-12-25 MED ORDER — DICYCLOMINE HCL 10 MG PO CAPS
10.0000 mg | ORAL_CAPSULE | Freq: Three times a day (TID) | ORAL | 11 refills | Status: DC
Start: 2019-12-25 — End: 2021-01-15

## 2019-12-25 MED ORDER — SODIUM CHLORIDE 0.9 % IV SOLN
500.0000 mL | Freq: Once | INTRAVENOUS | Status: DC
Start: 2019-12-25 — End: 2021-03-12

## 2019-12-25 NOTE — Progress Notes (Signed)
CW vitals and SS IV.

## 2019-12-25 NOTE — Progress Notes (Signed)
Called to room to assist during endoscopic procedure.  Patient ID and intended procedure confirmed with present staff. Received instructions for my participation in the procedure from the performing physician.  

## 2019-12-25 NOTE — Progress Notes (Signed)
Report to PACU, RN, vss, BBS= Clear.  

## 2019-12-25 NOTE — Patient Instructions (Signed)
YOU HAD AN ENDOSCOPIC PROCEDURE TODAY AT [facility]:   Refer to the procedure report that was given to you for any specific questions about what was found during the examination.  If the procedure report does not answer your questions, please call your gastroenterologist to clarify.  If you requested that your care partner not be given the details of your procedure findings, then the procedure report has been included in a sealed envelope for you to review at your convenience later.  YOU SHOULD EXPECT: Some feelings of bloating in the abdomen. Passage of more gas than usual.  Walking can help get rid of the air that was put into your GI tract during the procedure and reduce the bloating. If you had a lower endoscopy (such as a colonoscopy or flexible sigmoidoscopy) you may notice spotting of blood in your stool or on the toilet paper. If you underwent a bowel prep for your procedure, you may not have a normal bowel movement for a few days.  Please Note:  You might notice some irritation and congestion in your nose or some drainage.  This is from the oxygen used during your procedure.  There is no need for concern and it should clear up in a day or so.  SYMPTOMS TO REPORT IMMEDIATELY:     Following upper endoscopy (EGD)  Vomiting of blood or coffee ground material  New chest pain or pain under the shoulder blades  Painful or persistently difficult swallowing  New shortness of breath  Fever of 100F or higher  Black, tarry-looking stools  For urgent or emergent issues, a gastroenterologist can be reached at any hour by calling [phone]. Do not use MyChart messaging for urgent concerns.    DIET:  We do recommend a small meal at first, but then you may proceed to your regular diet.  Drink plenty of fluids but you should avoid alcoholic beverages for 24 hours.  ACTIVITY:  You should plan to take it easy for the rest of today and you should NOT DRIVE or use heavy machinery  until tomorrow (because of the sedation medicines used during the test).    FOLLOW UP: Our staff will call the number listed on your records 48-72 hours following your procedure to check on you and address any questions or concerns that you may have regarding the information given to you following your procedure. If we do not reach you, we will leave a message.  We will attempt to reach you two times.  During this call, we will ask if you have developed any symptoms of COVID 19. If you develop any symptoms (ie: fever, flu-like symptoms, shortness of breath, cough etc.) before then, please call [phone].  If you test positive for Covid 19 in the 2 weeks post procedure, please call and report this information to [place].    If any biopsies were taken you will be contacted by phone or by letter within the next 1-3 weeks.  Please call us at [phone] if you have not heard about the biopsies in 3 weeks.    SIGNATURES/CONFIDENTIALITY: You and/or your care partner have signed paperwork which will be entered into your electronic medical record.  These signatures attest to the fact that that the information above on your After Visit Summary has been reviewed and is understood.  Full responsibility of the confidentiality of this discharge information lies with you and/or your care-partner.   Resume medications. Pick up bentyl from [place]. Office will contact  you about ultrasound. Information given on gastritis.

## 2019-12-25 NOTE — Op Note (Signed)
[facility] Patient Name: [name] Procedure Date: 12/25/2019 1:49 PM MRN: [id number] Endoscopist: Ladene Artist , MD Age: 59 Referring MD:  Date of Birth: 06/21/1960 Gender: Female Account #: [id number] Procedure:                Upper GI endoscopy Indications:              Epigastric abdominal pain Medicines:                Monitored Anesthesia Care Procedure:                Pre-Anesthesia Assessment:                           - Prior to the procedure, a History and Physical                            was performed, and patient medications and                            allergies were reviewed. The patient's tolerance of                            previous anesthesia was also reviewed. The risks                            and benefits of the procedure and the sedation                            options and risks were discussed with the patient.                            All questions were answered, and informed consent                            was obtained. Prior Anticoagulants: The patient has                            taken no previous anticoagulant or antiplatelet                            agents. ASA Grade Assessment: III - A patient with                            severe systemic disease. After reviewing the risks                            and benefits, the patient was deemed in                            satisfactory condition to undergo the procedure.                           After obtaining informed consent, the endoscope was  passed under direct vision. Throughout the                            procedure, the patient's blood pressure, pulse, and                            oxygen saturations were monitored continuously. The                            Endoscope was introduced through the mouth, and                            advanced to the second part of duodenum. The upper                            GI endoscopy was accomplished  without difficulty.                            The patient tolerated the procedure well. Scope In: Scope Out: Findings:                 The examined esophagus was normal.                           Diffuse moderate inflammation characterized by                            erythema, friability and granularity was found in                            the gastric body and in the gastric antrum.                            Biopsies were taken with a cold forceps for                            histology.                           The exam of the stomach was otherwise normal.                           The duodenal bulb and second portion of the                            duodenum were normal. Complications:            No immediate complications. Estimated Blood Loss:     Estimated blood loss was minimal. Impression:               - Normal esophagus.                           - Gastritis. Biopsied.                           - Normal duodenal  bulb and second portion of the                            duodenum. Recommendation:           - Patient has a contact number available for                            emergencies. The signs and symptoms of potential                            delayed complications were discussed with the                            patient. Return to normal activities tomorrow.                            Written discharge instructions were provided to the                            patient.                           - Resume previous diet.                           - Continue present medications.                           - Await pathology results.                           - Bentyl (dicyclomine) 10 mg PO TID 30 min AC, 1                            year of refills.                           - Perform a RUQ ultrasound at the next available                            appointment. Ladene Artist, MD 12/25/2019 2:10:59 PM This report has been signed electronically.

## 2019-12-26 ENCOUNTER — Other Ambulatory Visit: Payer: Self-pay

## 2019-12-26 DIAGNOSIS — R1013 Epigastric pain: Secondary | ICD-10-CM

## 2019-12-27 ENCOUNTER — Telehealth: Payer: Self-pay

## 2019-12-27 ENCOUNTER — Telehealth: Payer: Self-pay | Admitting: *Deleted

## 2019-12-27 NOTE — Telephone Encounter (Signed)
Pt is wanting to inform the nurse her [place] has been rescheduled to 7/26

## 2019-12-27 NOTE — Telephone Encounter (Signed)
Patient has been scheduled for RUQ [place] on 01/07/20 9:30.  She is asked to arrive at [facility] radiology and be NPO after midnight.

## 2019-12-27 NOTE — Telephone Encounter (Signed)
  Follow up Call-  Call back number 12/25/2019  Post procedure Call Back phone  # [phone]  Permission to leave phone message Yes  Some recent data might be hidden     Patient questions:  Message left to call us if necessary.

## 2019-12-27 NOTE — Telephone Encounter (Signed)
  Follow up Call-  Call back number 12/25/2019  Post procedure Call Back phone  # [phone]  Permission to leave phone message Yes  Some recent data might be hidden     Patient questions:  Attempted to speak to patient , but pt was checking out at a store and didn't respond. Second call.

## 2020-01-01 DIAGNOSIS — E559 Vitamin D deficiency, unspecified: Secondary | ICD-10-CM | POA: Diagnosis not present

## 2020-01-01 DIAGNOSIS — Z79899 Other long term (current) drug therapy: Secondary | ICD-10-CM | POA: Diagnosis not present

## 2020-01-01 DIAGNOSIS — M5136 Other intervertebral disc degeneration, lumbar region: Secondary | ICD-10-CM | POA: Diagnosis not present

## 2020-01-03 ENCOUNTER — Other Ambulatory Visit: Payer: Self-pay

## 2020-01-03 ENCOUNTER — Encounter: Payer: Self-pay | Admitting: Internal Medicine

## 2020-01-03 ENCOUNTER — Ambulatory Visit (INDEPENDENT_AMBULATORY_CARE_PROVIDER_SITE_OTHER): Payer: PPO | Admitting: Internal Medicine

## 2020-01-03 VITALS — BP 120/60 | HR 92 | Ht 64.0 in | Wt 256.4 lb

## 2020-01-03 DIAGNOSIS — E1165 Type 2 diabetes mellitus with hyperglycemia: Secondary | ICD-10-CM | POA: Diagnosis not present

## 2020-01-03 DIAGNOSIS — Z794 Long term (current) use of insulin: Secondary | ICD-10-CM

## 2020-01-03 DIAGNOSIS — E1140 Type 2 diabetes mellitus with diabetic neuropathy, unspecified: Secondary | ICD-10-CM | POA: Diagnosis not present

## 2020-01-03 DIAGNOSIS — E785 Hyperlipidemia, unspecified: Secondary | ICD-10-CM | POA: Diagnosis not present

## 2020-01-03 DIAGNOSIS — G4733 Obstructive sleep apnea (adult) (pediatric): Secondary | ICD-10-CM | POA: Diagnosis not present

## 2020-01-03 LAB — POCT GLYCOSYLATED HEMOGLOBIN (HGB A1C): Hemoglobin A1C: 8.8 % — AB (ref 4.0–5.6)

## 2020-01-03 MED ORDER — NOVOLOG MIX 70/30 FLEXPEN (70-30) 100 UNIT/ML ~~LOC~~ SUPN: 84.0000 [IU] | PEN_INJECTOR | Freq: Two times a day (BID) | SUBCUTANEOUS | 11 refills | Status: DC

## 2020-01-03 MED ORDER — METFORMIN HCL 1000 MG PO TABS: 1000.0000 mg | ORAL_TABLET | Freq: Two times a day (BID) | ORAL | 3 refills | Status: DC

## 2020-01-03 NOTE — Progress Notes (Signed)
Name: [name]  Age/ Sex: 59 y.o., female   MRN/ DOB: [id number], June 14, 1960     PCP: Harvie Heck, MD   Reason for Endocrinology Evaluation: Type 2 Diabetes Mellitus  Initial Endocrine Consultative Visit: 03/18/2019    PATIENT IDENTIFIER: [name] is a 59 y.o. female with a past medical history of T2DM, HTN, OSA and dyslipidemia . The patient has followed with Endocrinology clinic since 03/18/2019 for consultative assistance with management of her diabetes.  DIABETIC HISTORY:  [name] was diagnosed with T2DM many years ago. Has been on Soliqua, Glipizide and V-Go was started in 2019.  Her hemoglobin A1c has ranged from 8.1% in 2016, peaking at 9.8% in 2020.  On her initial visit to our clinic, she was on V-Go 40 with Humulin U-500 , Metformin, and Ozempic with an A1c 9.7% . We stopped the V-Go  And the U-500 due to recurrent hypoglycemia . We started Novolog Mix and continued metformin and Ozempic.  SUBJECTIVE:   During the last visit (08/16/2019): A1c 9.4% .  We increased her insulin regimen and ozempic and continued Metformin.     Today (01/03/2020): [name] is here for a follow up on her diabetes care.  She checks her blood sugars multiple times daily, through freestyle libre. The patient has not  had hypoglycemic episodes since the last clinic visit.   HOME DIABETES REGIMEN:  Metformin 1000 tablet Twice daily  Ozempic 1 mg weekly  Novolog Mix (70/30) 70 units before breakfast and Supper     CONTINUOUS GLUCOSE MONITORING RECORD INTERPRETATION    Dates of Recording:7/10-7/23/2021  Sensor description: Freestyle Libre  Results statistics:   CGM use % of time 78  Average and SD 218/19.7  Time in range   18 %  % Time Above 180 60  % Time above 250 22  % Time Below target 0    Glycemic patterns summary: Hyperglycemia throughout the day and night Hyperglycemic episodes  As above   Hypoglycemic episodes occurred none Overnight periods: stable   Preprandial  periods: high    HISTORY:  Past Medical History:  Past Medical History:  Diagnosis Date   Allergy    Arthritis    back    Asthma    AS CHILD   Chronic back pain    Chronic leg pain    due to back pain   Cocaine abuse ([place])    in remission   Diabetes ([place])    with neuropathy   GERD (gastroesophageal reflux disease)    Hyperlipidemia    Hypertension    Neuromuscular disorder ([place])    neuropathy   RECTAL BLEEDING 12/09/2008   Annotation: s/p EGD 7/08 mild gastritis, s/p colonoscopy 7/08- benign polyp  s/p polypectomy and isolated diverticulum.  Qualifier: Diagnosis of  By: Ditzler RN, Debra     Sleep apnea    CPAP    YEARS AGO DONE 1/2 YEARS AGO AND WAS TOLD DID NOT HAVE   Tobacco abuse    Uterine fibroid    s/p hysterectomy   Past Surgical History:  Past Surgical History:  Procedure Laterality Date   BACK SURGERY  2012   L5-S1 microendoscopic disectomy last surgery 06/2011   BREAST BIOPSY     LEFT    01/19/16   BREAST EXCISIONAL BIOPSY Left 02/2016   BREAST REDUCTION SURGERY  1982   CATARACT EXTRACTION Right 11/07/2019   COLONOSCOPY     HAND SURGERY     MIDDLE TRIGGER FINGER RIGHT SIDE  RADIOACTIVE SEED GUIDED EXCISIONAL BREAST BIOPSY Left 02/18/2016   Procedure: LEFT RADIOACTIVE SEED GUIDED EXCISIONAL BREAST BIOPSY;  Surgeon: Alphonsa Overall, MD;  Location: [facility];  Service: General;  Laterality: Left;   REDUCTION MAMMAPLASTY Bilateral    TONSILLECTOMY  2008   TOTAL ABDOMINAL HYSTERECTOMY  05/24/2007   hysterectomy   TUBAL LIGATION     UPPER GASTROINTESTINAL ENDOSCOPY      Social History:  reports that she quit smoking about 23 years ago. Her smoking use included cigarettes. She has a 10.00 pack-year smoking history. She has never used smokeless tobacco. She reports that she does not drink alcohol and does not use drugs. Family History:  Family History  Problem Relation Age of Onset   Diabetes Father    Heart disease Father     Hypertension Father    Diabetes Mother    Cancer Mother        brain   Hypertension Mother    Kidney disease Sister    Diabetes Sister    Kidney cancer Sister    Colon cancer Neg Hx    Colon polyps Neg Hx    Esophageal cancer Neg Hx    Rectal cancer Neg Hx    Stomach cancer Neg Hx      HOME MEDICATIONS: Allergies as of 01/03/2020      Reactions   Lisinopril Cough      Medication List       Accurate as of January 03, 2020  7:19 AM. If you have any questions, ask your nurse or doctor.        acyclovir 400 MG tablet Commonly known as: ZOVIRAX Take 1 tablet (400 mg total) by mouth 3 (three) times daily as needed (flares).   albuterol 1.25 MG/3ML nebulizer solution Commonly known as: ACCUNEB   AMBULATORY NON FORMULARY MEDICATION 56m 2% lidocaine 928mdicyclomine 1044mml 270 ml maalox   aspirin 81 MG EC tablet Take 81 mg by mouth daily.   budesonide-formoterol 80-4.5 MCG/ACT inhaler Commonly known as: Symbicort Inhale 2 puffs into the lungs 2 (two) times daily.   chlorpheniramine 4 MG tablet Commonly known as: CHLOR-TRIMETON Take 4 mg by mouth every 4 (four) hours as needed for allergies.   Continuous Glucose Monitor Devi Use as directed.   diclofenac Sodium 1 % Gel Commonly known as: VOLTAREN Apply 2 g topically 4 (four) times daily. Rub into affected area of foot 2 to 4 times daily   dicyclomine 10 MG capsule Commonly known as: BENTYL Take 1 capsule (10 mg total) by mouth 3 (three) times daily before meals.   esomeprazole 40 MG capsule Commonly known as: NEXIUM Take 1 capsule (40 mg total) by mouth 2 (two) times daily before a meal.   famotidine 20 MG tablet Commonly known as: PEPCID Take 20 mg by mouth 2 (two) times daily as needed.   FreeStyle LibCharlotte HallReader SysTXU [place] by Does not apply route 4 (four) times daily.   FreeStyle Libre 2 Sensor Systm Misc 1 each by Does not apply route 4 (four) times daily.   gabapentin 600 MG  tablet Commonly known as: NEURONTIN Take 600 mg by mouth 3 (three) times daily.   GARLIC 1504270 Take by mouth.   hydrochlorothiazide 25 MG tablet Commonly known as: HYDRODIURIL Take 1 tablet (25 mg total) by mouth daily.   Insulin Pen Needle 31G X 5 MM Misc Commonly known as: B-D UF III MINI PEN NEEDLES Use to inject insulin twice a day  INSULIN SYRINGE 1CC/30GX1/2" 30G X 1/2" 1 ML Misc Use to fill Vgo daily   irbesartan 150 MG tablet Commonly known as: Avapro Take 2 tablets (300 mg total) by mouth daily.   metFORMIN 1000 MG tablet Commonly known as: GLUCOPHAGE TAKE 1 TABLET BY MOUTH TWICE DAILY WITH A MEAL   montelukast 10 MG tablet Commonly known as: SINGULAIR Take 1 tablet (10 mg total) by mouth at bedtime.   multivitamin with minerals Tabs tablet Take 1 tablet by mouth daily.   Narcan 4 MG/0.1ML Liqd nasal spray kit Generic drug: naloxone USE 1 SPRAY IN THE NOSE AS NEEDED   NovoLOG Mix 70/30 FlexPen (70-30) 100 UNIT/ML FlexPen Generic drug: insulin aspart protamine - aspart Inject 0.7 mLs (70 Units total) into the skin 2 (two) times daily with a meal.   OneTouch Delica Lancets Fine Misc Check blood sugar 3 times a day   OneTouch Verio Flex System w/Device Kit 1 each by Does not apply route 3 (three) times daily.   OneTouch Verio test strip Generic drug: glucose blood CHECK BLOOD SUGAR 3 TIMES A DAY   oxyCODONE-acetaminophen 5-325 MG tablet Commonly known as: Percocet Take 1 tablet by mouth every 8 (eight) hours as needed for severe pain.   Ozempic (1 MG/DOSE) 2 MG/1.5ML Sopn Generic drug: Semaglutide (1 MG/DOSE) Inject 1 mg into the skin once a week.   pantoprazole 40 MG tablet Commonly known as: PROTONIX Take 1 tablet by mouth twice daily   Prednisolon-Gatiflox-Bromfenac 1-0.5-0.075 % Susp Place 1 drop into the right [name] 4 (four) times daily.   QC TUMERIC COMPLEX PO Take by mouth.   rosuvastatin 20 MG tablet Commonly known as: Crestor Take  1 tablet (20 mg total) by mouth daily.   tiZANidine 4 MG capsule Commonly known as: ZANAFLEX Take 4 mg by mouth 3 (three) times daily as needed.   valACYclovir 500 MG tablet Commonly known as: VALTREX TAKE 1 TABLET BY MOUTH TWICE DAILY FOR 5 DAYS AT  TIME  OF  OUTBREAK  AND  1  EVERYDAY  FOR  SUPPRESION   vitamin C 100 MG tablet Take 100 mg by mouth daily.   Vitamin D (Ergocalciferol) 1.25 MG (50000 UNIT) Caps capsule Commonly known as: DRISDOL Take 1 capsule (50,000 Units total) by mouth every 7 (seven) days.        OBJECTIVE:   Vital Signs: There were no vitals taken for this visit.  Wt Readings from Last 3 Encounters:  12/25/19 252 lb (114.3 kg)  12/18/19 252 lb 4 oz (114.4 kg)  08/16/19 249 lb 9.6 oz (113.2 kg)     Exam: General: Pt appears well and is in NAD  Lungs: Clear with good BS bilat with no rales, rhonchi, or wheezes  Heart: RRR with normal S1 and S2 and no gallops; no murmurs; no rub  Extremities: No pretibial edema.   Skin: Normal texture and temperature to palpation.   Neuro: MS is good with appropriate affect, pt is alert and Ox3    DM foot exam: 08/16/2019  The skin of the feet is intact without sores or ulcerations. The pedal pulses are 2+ on right and 2+ on left. The sensation is intact to a screening 5.07, 10 gram monofilament bilaterally          DATA REVIEWED:  Lab Results  Component Value Date   HGBA1C 9.4 (A) 08/16/2019   HGBA1C 10.8 (A) 05/14/2019   HGBA1C 9.7 (H) 01/11/2019   Lab Results  Component Value Date  MICROALBUR 0.99 09/30/2009   LDLCALC 91 08/16/2019   CREATININE 0.60 12/18/2019   Lab Results  Component Value Date   MICRALBCREAT 15 02/12/2019     Lab Results  Component Value Date   CHOL 147 08/16/2019   HDL 36.30 (L) 08/16/2019   LDLCALC 91 08/16/2019   TRIG 99.0 08/16/2019   CHOLHDL 4 08/16/2019         ASSESSMENT / PLAN / RECOMMENDATIONS:   1) Type 2 Diabetes Mellitus, With improved glycemic  control, With Neuropathy complications - Most recent A1c of 8.8 %. Goal A1c < 7.0 %.     - Improved glycemic control but A1c continues to be above goal of 7.0 %  - We discussed add -on therapy with SGLT-2 inhibitors, she recalls being on Jardiance in the past, she is not keep on starting this at this time.  - Will adjust insulin as below   Plan: MEDICATIONS: - Continue Metformin 1 tablet Twice daily  - Continue Ozempic 1 mg weekly  - Increase  Novolog Mix (70/30) 84 units with breakfast and 84 units with supper    EDUCATION / INSTRUCTIONS:  BG monitoring instructions: Patient is instructed to check her blood sugars 2 times a day, fasting and bedtime .  Call [facility] if: BG persistently < 70   I reviewed the Rule of 15 for the treatment of hypoglycemia in detail with the patient. Literature supplied.    2.  Dyslipidemia:  - Improved LDL on Atorvastatin , pt requested to switch to Rosuvastatin  due to leg pains with Atorvastatin    Continue Crestor 20 mg daily    F/U in 6 months    Signed electronically by: Mack Guise, MD  [facility]  [address] [place], [place] [address] Phone: [phone] FAX: [phone]   CC: Harvie Heck, MD [address] [place] [address] Phone: [phone]  Fax: [phone]  Return to Endocrinology clinic as below: Future Appointments  Date Time Provider [place]  01/03/2020  9:30 AM Kanetra Ho, Melanie Crazier, MD [facility] None  01/06/2020  9:30 AM [facility] 2 [facility] [place]  01/09/2020  9:45 AM Trula Slade, DPM [facility]  03/18/2020  8:45 AM Bo Merino, MD [facility] None  04/09/2020 11:00 AM Bo Merino, MD [facility] None  05/28/2020 10:00 AM Deneise Lever, MD [facility] None

## 2020-01-03 NOTE — Patient Instructions (Addendum)
-   Continue Metformin 1 tablet Twice daily  - Continue Ozempic 1.0 mg weekly  - Increase  Novolog Mix (70/30) 84 units with  breakfast and 84 units with Supper      HOW TO TREAT LOW BLOOD SUGARS (Blood sugar LESS THAN 70 MG/DL)  Please follow the RULE OF 15 for the treatment of hypoglycemia treatment (when your (blood sugars are less than 70 mg/dL)    STEP 1: Take 15 grams of carbohydrates when your blood sugar is low, which includes:   3-4 GLUCOSE TABS  OR  3-4 OZ OF JUICE OR REGULAR SODA OR  ONE TUBE OF GLUCOSE GEL     STEP 2: RECHECK blood sugar in 15 MINUTES STEP 3: If your blood sugar is still low at the 15 minute recheck --> then, go back to STEP 1 and treat AGAIN with another 15 grams of carbohydrates.

## 2020-01-06 ENCOUNTER — Ambulatory Visit (HOSPITAL_COMMUNITY)
Admission: RE | Admit: 2020-01-06 | Discharge: 2020-01-06 | Disposition: A | Discharge status: Home / Self Care | Payer: PPO | Source: Ambulatory Visit | Attending: Gastroenterology | Admitting: Gastroenterology

## 2020-01-06 ENCOUNTER — Encounter: Payer: Self-pay | Admitting: Gastroenterology

## 2020-01-06 ENCOUNTER — Telehealth: Payer: Self-pay | Admitting: Internal Medicine

## 2020-01-06 ENCOUNTER — Other Ambulatory Visit: Payer: Self-pay

## 2020-01-06 DIAGNOSIS — R1013 Epigastric pain: Secondary | ICD-10-CM | POA: Insufficient documentation

## 2020-01-06 DIAGNOSIS — K760 Fatty (change of) liver, not elsewhere classified: Secondary | ICD-10-CM | POA: Diagnosis not present

## 2020-01-06 DIAGNOSIS — E109 Type 1 diabetes mellitus without complications: Secondary | ICD-10-CM | POA: Diagnosis not present

## 2020-01-06 NOTE — Telephone Encounter (Signed)
The request for paperwork from [facility] [employer] was in my box. They are requesting medical records that show in person visit in last 6 months, the language "patient has diabetes" and  "is currently using CGM" in the note and the note is signed and dated. I'll ask front office to schedule her an appointment.  Patient notified.

## 2020-01-06 NOTE — Telephone Encounter (Signed)
Call placed to patient to learn which supplies are needed; she thinks it is for the [employer]. Call placed to [facility]. They are requesting the last 6 months of chart notes be faxed to [phone]. Will discuss with Kalyani Maeda to see if this has already been done. Hubbard Hartshorn, BSN, RN-BC

## 2020-01-06 NOTE — Telephone Encounter (Signed)
Pls contact regarding supplies [phone]

## 2020-01-07 ENCOUNTER — Ambulatory Visit (HOSPITAL_COMMUNITY): Payer: PPO

## 2020-01-07 NOTE — Telephone Encounter (Signed)
Spoke with the pt. She has sch an appt with you and her Pcp  Dr. Marva Panda for tomorrow 01/08/2020.

## 2020-01-08 ENCOUNTER — Ambulatory Visit (INDEPENDENT_AMBULATORY_CARE_PROVIDER_SITE_OTHER): Payer: PPO | Admitting: Internal Medicine

## 2020-01-08 ENCOUNTER — Ambulatory Visit (INDEPENDENT_AMBULATORY_CARE_PROVIDER_SITE_OTHER): Payer: PPO | Admitting: Dietician

## 2020-01-08 ENCOUNTER — Encounter: Payer: Self-pay | Admitting: Dietician

## 2020-01-08 VITALS — BP 128/77 | HR 86 | Temp 98.2°F | Wt 252.4 lb

## 2020-01-08 DIAGNOSIS — Z713 Dietary counseling and surveillance: Secondary | ICD-10-CM | POA: Diagnosis not present

## 2020-01-08 DIAGNOSIS — E1140 Type 2 diabetes mellitus with diabetic neuropathy, unspecified: Secondary | ICD-10-CM

## 2020-01-08 DIAGNOSIS — E1165 Type 2 diabetes mellitus with hyperglycemia: Secondary | ICD-10-CM

## 2020-01-08 DIAGNOSIS — Z6841 Body Mass Index (BMI) 40.0 and over, adult: Secondary | ICD-10-CM | POA: Diagnosis not present

## 2020-01-08 DIAGNOSIS — Z794 Long term (current) use of insulin: Secondary | ICD-10-CM

## 2020-01-08 DIAGNOSIS — K219 Gastro-esophageal reflux disease without esophagitis: Secondary | ICD-10-CM

## 2020-01-08 DIAGNOSIS — I10 Essential (primary) hypertension: Secondary | ICD-10-CM | POA: Diagnosis not present

## 2020-01-08 MED ORDER — INSULIN PEN NEEDLE 32G X 4 MM MISC: 3 refills | Status: DC

## 2020-01-08 NOTE — Patient Instructions (Addendum)
Hi Clement Husbands to see you!   Here  Are the high points from what we discussed today:   1- It is not recommended to keep the insulin pen(s) you are using in the refrigerator- you can  keep them with your meter 2- It is suggested to rotate injection sites as we discussed-  so you never hit the same spot more than 1x in 30 days 3-  Your type of insulin works best if it is taken 15-30 minutes before eating breakfast and dinner  I hope our discussion and ideas for fast, healthy and easy meals and snacks helped.  I suggest we follow up in 4 weeks either in person or telehealth.   Please call for questions or concerns,   Tara [phone]

## 2020-01-08 NOTE — Patient Instructions (Signed)
[name],  It was a pleasure seeing you in clinic. Today we discussed:   Diabetes: Great job on all your hard work thus far! Keep it up! Please continue to follow up with endocrinology and continue your diabetes medications.  Please follow up with the eye doctor for your retinal eye exam.   If you have any questions or concerns, please call our clinic at [phone] between 9am-5pm and after hours call [phone] and ask for the internal medicine resident on call. If you feel you are having a medical emergency please call 911.   Thank you, we look forward to helping you remain healthy!   If you have not already done so, I recommend getting the COVID 19 vaccine.  To schedule an appointment for a COVID vaccine or be added to the vaccine wait list: Go to [email or web address]   OR Go to [email or web address]                  OR Call [phone]                                     OR Call [phone] and select Option 2

## 2020-01-08 NOTE — Progress Notes (Signed)
Medical Nutrition Therapy:  Appt start time: 3846 end time:  1515 Total time: 50 Visit #    Assessment:  Primary concerns today: lowering her blood sugars/a1c Rogue comes in for a diabetes follow up for help with lowering her blood sugars. She would like her A1C and blood sugars to be lower than they are. She says she was taking insulin at home in the morning and then eating breakfast after on her way to work or at work, but has gotten off schedule due to recent tests and being very busy at work. Today she did not take her morning insulin until after her lunch on her way to our office.  She keeps her insulin in the refrigerator at work and at home and admits to forgetting to take it at times. She also has been having to ration her Ozempic pen needles so she can take her insulin and or reuse the needles because her prescription ran out. She does not rotate her injection sites. She does remix her insulin before injecting it. She denies signs and symptoms of low blood sugar. Her CGM download shows mostly above target range and only one day of the two weeks dropping to a reading of 105 Preferred Learning Style: Auditory and Visual Learning Readiness: Ready  ANTHROPOMETRICS: Estimated body mass index is 43.32 kg/m as calculated from the following:   Height as of 01/03/20: 5\' 4"  (1.626 m).   Weight as of this encounter: 252 lb 6.4 oz (114.5 kg).  WEIGHT HISTORY: her weight has been stable  Wt Readings from Last 10 Encounters:  01/08/20 (!) 252 lb 6.4 oz (114.5 kg)  01/08/20 (!) 252 lb 6.4 oz (114.5 kg)  01/03/20 (!) 256 lb 6.4 oz (116.3 kg)  12/25/19 252 lb (114.3 kg)  12/18/19 252 lb 4 oz (114.4 kg)  08/16/19 249 lb 9.6 oz (113.2 kg)  05/17/19 257 lb 9.6 oz (116.8 kg)  05/14/19 255 lb 8 oz (115.9 kg)  03/15/19 253 lb 6.4 oz (114.9 kg)  03/05/19 257 lb 6.4 oz (116.8 kg)    SLEEP:she rates it as fair, wakes one time a night to urinate, sleeps ~ 6-7 hours, reports having a hard time falling  asleep at times  DIABETES MEDICATIONS: reports taking 83 units Novolog Mix 70/30 twice daily;  Ozempic 1 mg weekly;  Metformin 1000 mg twice daily; Multivitamin daily Garlic pill daily; vitamin D gummie daily ; elderberry gummie daily  BLOOD SUGAR:  CGM Results from download: Scanning 2-8 x/day  % Time CGM active:   79 %   (Goal >70%)  Average glucose:   224 mg/dL for 14 days  Glucose management indicator:   8.7 %  Time in range (70-180 mg/dL):   16 %   (Goal >70%)  Time High (181-250 mg/dL):   58 %   (Goal < 25%)  Time Very High (>250 mg/dL):    26 %   (Goal < 5%)  Time Low (54-69 mg/dL):   0 %   (Goal <4%)  Time Very Low (<54 mg/dL):   0 %   (Goal <1%)  Coefficient of variation:  18.9 %   (Goal <36%)   Lab Results  Component Value Date   HGBA1C 8.8 (A) 01/03/2020   HGBA1C 9.4 (A) 08/16/2019   HGBA1C 10.8 (A) 05/14/2019   HGBA1C 9.7 (H) 01/11/2019   HGBA1C 9.8 (A) 12/10/2018      DIETARY INTAKE: Usual eating pattern includes 2-3 meals and 1-2 snacks per day. Everyday foods include  fruit  Avoided foods include milk Stomach upset she did not specify Dining Out (times/week): 5 days week 24-hr recall:  B ( AM): banana Snk ( AM): nabs, banana chips L ( ~ 12-1PM): fruit- today had sausage biscuit and steak fries that she never ate for breakfast She took her breakfast insulin after eating  Usually take PM insulin before evening meal D ( PM): tomato sandwich with lunch meat, mayonnaise and whole wheat bread, steak, veggie and potato Snk ( PM): oatmeal cookie Beverages: water, diet soda  Usual physical activity: not discussed today   Progress Towards Goal(s):  In progress.   Nutritional Diagnosis:  NI-5.8.4 Inconsistent carbohydrate intake As related to fluctuating and elevated blood sugars.  As evidenced by her report and dietary recall.    Intervention:  Nutrition education about site selection and rotation, ideas for healthier, fast and easy meals and snacks, suggested  meal plan of 30-45 grams carb.meal and <30 for snacks, taking insuilin at consistent times before eating meals or discussing with her endocrinologist if she finds this too difficult.  Action Goal:see patient instructions  Outcome goal: improved blood sugars Coordination of care: discussed her care and ordering more pen needles with Dr. Marva Panda. Will route  this note to her endocrinologist  Teaching Method Utilized: Visual, Auditory,Hands on Handouts given during visit include:patient instructions Barriers to learning/adherence to lifestyle change: competing values Demonstrated degree of understanding via:  Teach Back   Monitoring/Evaluation:  Dietary intake, exercise, CBG meter, and body weight in 4 week(s). Debera Lat, RD 01/08/2020 4:49 PM.

## 2020-01-09 ENCOUNTER — Encounter: Payer: Self-pay | Admitting: Internal Medicine

## 2020-01-09 ENCOUNTER — Other Ambulatory Visit: Payer: Self-pay

## 2020-01-09 ENCOUNTER — Ambulatory Visit: Payer: PPO | Admitting: Podiatry

## 2020-01-09 DIAGNOSIS — M79675 Pain in left toe(s): Secondary | ICD-10-CM | POA: Diagnosis not present

## 2020-01-09 DIAGNOSIS — R1013 Epigastric pain: Secondary | ICD-10-CM

## 2020-01-09 DIAGNOSIS — E1165 Type 2 diabetes mellitus with hyperglycemia: Secondary | ICD-10-CM

## 2020-01-09 DIAGNOSIS — B351 Tinea unguium: Secondary | ICD-10-CM

## 2020-01-09 DIAGNOSIS — M79674 Pain in right toe(s): Secondary | ICD-10-CM | POA: Diagnosis not present

## 2020-01-09 NOTE — Assessment & Plan Note (Signed)
Ms. Mullany is on irbesartan 150mg  and HCTZ 25mg  daily. BP is 128/77 at this visit. She denies any chest pain, shortness of breath, headache or dizziness and is tolerating medications well.  Plan: Continue current regimen

## 2020-01-09 NOTE — Assessment & Plan Note (Addendum)
Most recent HbA1c is 8.8. [name] has been followed by endocrinologist and at her last visit, her Novolog 70/30 was increased to 84U bid. She reports improvement in her CBGs, although has remained elevated with average around 224. She is currently using CGM to monitor glucose levels. She denies any hypoglycemic episodes. Encouraged patient to continue with current regimen and continue to follow with endocrinologist.  Plan: Continue metformin 1000mg  bid Novolog 70/30 mix 84U bid HbA1c in 3 months  F/u with endocrinologist

## 2020-01-09 NOTE — Progress Notes (Signed)
Internal Medicine Clinic Attending ° °Case discussed with Dr. Aslam  At the time of the visit.  We reviewed the resident’s history and exam and pertinent patient test results.  I agree with the assessment, diagnosis, and plan of care documented in the resident’s note.  °

## 2020-01-09 NOTE — Assessment & Plan Note (Signed)
Patient is on Nexium and pepcid. She is followed by GI and recent EGD with mild chronic gastritis and reactive gastropathy. She notes some ongoing generalized abdominal discomfort but notes symptoms are well controlled with GI cocktail w/lidocaine that she takes on as needed basis.  Suspect she might have component of diabetic gastroparesis.   Plan: Cotinue nexium 70mh bid and pepcid 20mg  bid prn Continue GI cocktail prn F/u with GI as needed

## 2020-01-09 NOTE — Progress Notes (Signed)
   CC: diabetes f/u  HPI:  [name] is a 59 y.o. female with PMHx as listed below presenting for follow up of her diabetes. She denies any acute concerns at this visit. Please see problem based charting for complete assessment and plan.  Past Medical History:  Diagnosis Date  . Allergy   . Arthritis    back   . Asthma    AS CHILD  . Chronic back pain   . Chronic leg pain    due to back pain  . Cocaine abuse ([name])    in remission  . Diabetes ([place])    with neuropathy  . GERD (gastroesophageal reflux disease)   . Hyperlipidemia   . Hypertension   . Neuromuscular disorder ([facility])    neuropathy  . RECTAL BLEEDING 12/09/2008   Annotation: s/p EGD 7/08 mild gastritis, s/p colonoscopy 7/08- benign polyp  s/p polypectomy and isolated diverticulum.  Qualifier: Diagnosis of  By: Ditzler RN, Debra    . Sleep apnea    CPAP    YEARS AGO DONE 1/2 YEARS AGO AND WAS TOLD DID NOT HAVE  . Tobacco abuse   . Uterine fibroid    s/p hysterectomy   Review of Systems:  Negative except as stated in HPI.  Physical Exam:  Vitals:   01/08/20 1538  BP: 128/77  Pulse: 86  Temp: 98.2 F (36.8 C)  TempSrc: Oral  SpO2: 96%  Weight: (!) 252 lb 6.4 oz (114.5 kg)   Physical Exam  Constitutional: Obese female. No distress.  HENT: Normocephalic and atraumatic, EOMI, conjunctiva normal, moist mucous membranes Cardiovascular: Normal rate, regular rhythm, S1 and S2 present, no murmurs, rubs, gallops.  Distal pulses intact Respiratory: No respiratory distress, no accessory muscle use.  Effort is normal.  Lungs are clear to auscultation bilaterally. Musculoskeletal: Normal bulk and tone.  No peripheral edema noted. Neurological: Is alert and oriented x4, no apparent focal deficits noted. Skin: Warm and dry.  No rash, erythema, lesions noted. Psychiatric: Normal mood and affect. Behavior is normal. Judgment and thought content normal.    Assessment & Plan:   See Encounters Tab for problem based  charting.  Patient discussed with Dr. Evette Doffing

## 2020-01-10 DIAGNOSIS — G4733 Obstructive sleep apnea (adult) (pediatric): Secondary | ICD-10-CM | POA: Diagnosis not present

## 2020-01-12 NOTE — Progress Notes (Signed)
Subjective: 59 year old female presents the office today for, discolored toenails that she cannot trim her self.  Denies any redness or drainage to the toenail sites.  In regards to the fracture on the right foot she states that she will occasionally get some discomfort but minimal pain currently no pain.  Denies any open sores and she has no other concerns today.  Her last A1c was 8.8 on 01/03/2020 which is improved from 9.4.  Objective: AAO x3, NAD DP/PT pulses palpable bilaterally, CRT less than 3 seconds Nails are hypertrophic, dystrophic, brittle, discolored, elongated 10. No surrounding redness or drainage. Tenderness nails 1-5 bilaterally. No open lesions or pre-ulcerative lesions are identified today. No area of tenderness identified to the feet today otherwise. No open lesions or pre-ulcerative lesions.  No pain with calf compression, swelling, warmth, erythema  Assessment: 59 year old female with uncontrolled diabetes, symptomatic onychomycosis  Plan: -All treatment options discussed with the patient including all alternatives, risks, complications.  -Nails debrided x10 without any complications or bleeding -Discussed daily foot inspection -Continue supportive shoe gear.  Return in about 3 months (around 04/10/2020) for diabetic nail trim.  Trula Slade DPM

## 2020-01-15 ENCOUNTER — Telehealth: Payer: Self-pay | Admitting: Internal Medicine

## 2020-01-17 ENCOUNTER — Encounter: Payer: Self-pay | Admitting: Internal Medicine

## 2020-01-17 ENCOUNTER — Ambulatory Visit (INDEPENDENT_AMBULATORY_CARE_PROVIDER_SITE_OTHER): Payer: PPO | Admitting: Internal Medicine

## 2020-01-17 ENCOUNTER — Other Ambulatory Visit: Payer: Self-pay

## 2020-01-17 VITALS — Ht 64.0 in | Wt 256.0 lb

## 2020-01-17 DIAGNOSIS — Z0000 Encounter for general adult medical examination without abnormal findings: Secondary | ICD-10-CM

## 2020-01-17 NOTE — Patient Instructions (Addendum)
Things That May Be Affecting Your Health:  Alcohol  Hearing loss x Pain    Depression  Home Safety  Sexual Health  x Diabetes  Lack of physical activity  Stress   Difficulty with daily activities  Loneliness  Tiredness   Drug use x Medicines  Tobacco use   Falls  Motor Vehicle Safety  Weight   Food choices  Oral Health  Other    YOUR PERSONALIZED HEALTH PLAN : 1. Schedule your next subsequent Medicare Wellness visit in one year 2. Attend all of your regular appointments to address your medical issues 3. Complete the preventative screenings and services 4.  Please schedule eye exam with [facility]. 5.  Congratulations on your goal to increase walking to 30 minutes a day 3 times weekly! 6.  Please continue to follow up with [facility] for diabetes management. 7. Begin seated and standing exercises with exercise band.    Annual Wellness Visit                       Medicare Covered Preventative Screenings and Services  Services & Screenings Men and Women Who How Often Need? Date of Last Service Action  Abdominal Aortic Aneurysm Adults with AAA risk factors Once     Alcohol Misuse and Counseling All Adults Screening once a year if no alcohol misuse. Counseling up to 4 face to face sessions.     Bone Density Measurement  Adults at risk for osteoporosis Once every 2 yrs     Lipid Panel Z13.6 All adults without CV disease Once every 5 yrs     Colorectal Cancer   Stool sample or  Colonoscopy All adults 48 and older   Once every year  Every 10 years     Depression All Adults Once a year Y Today   Diabetes Screening Blood glucose, post glucose load, or GTT Z13.1  All adults at risk  Pre-diabetics  Once per year  Twice per year Y    Diabetes  Self-Management Training All adults Diabetics 10 hrs first year; 2 hours subsequent years. Requires Copay Y    Glaucoma  Diabetics  Family history of glaucoma  African  Americans 22 yrs +  Hispanic Americans 56 yrs + Annually - requires coppay     Hepatitis C Z72.89 or F19.20  High Risk for HCV  Born between 1945 and 1965  Annually  Once     HIV Z11.4 All adults based on risk  Annually btw ages 70 & 73 regardless of risk  Annually > 65 yrs if at increased risk     Lung Cancer Screening Asymptomatic adults aged 30-77 with 30 pack yr history and current smoker OR quit within the last 15 yrs Annually Must have counseling and shared decision making documentation before first screen     Medical Nutrition Therapy Adults with   Diabetes  Renal disease  Kidney transplant within past 3 yrs 3 hours first year; 2 hours subsequent years Y    Obesity and Counseling All adults Screening once a year Counseling if BMI 30 or higher Y Today   Tobacco Use Counseling Adults who use tobacco  Up to 8 visits in one year     Vaccines Z23  Hepatitis B  Influenza   Pneumonia  Adults   Once  Once every flu season  Two different vaccines separated by one year     Next Annual Wellness Visit People with Medicare Every year  Today  Services & Screenings Women Who How Often Need  Date of Last Service Action  Mammogram  Z12.31 Women over 57 One baseline ages 61-39. Annually ager 40 yrs+     Pap tests All women Annually if high risk. Every 2 yrs for normal risk women     Screening for cervical cancer with   Pap (Z01.419 nl or Z01.411abnl) &  HPV Z11.51 Women aged 53 to 82 Once every 5 yrs     Screening pelvic and breast exams All women Annually if high risk. Every 2 yrs for normal risk women     Sexually Transmitted Diseases  Chlamydia  Gonorrhea  Syphilis All at risk adults Annually for non pregnant females at increased risk         [place] Men Who How Ofter Need  Date of Last Service Action  Prostate Cancer - DRE & PSA Men over 50 Annually.  DRE might require a copay.       Sexually Transmitted Diseases  Syphilis All at risk adults Annually for men at increased risk          Fall Prevention in the Home, Adult Falls can cause injuries. They can happen to people of all ages. There are many things you can do to make your home safe and to help prevent falls. Ask for help when making these changes, if needed. What actions can I take to prevent falls? General Instructions  Use good lighting in all rooms. Replace any light bulbs that burn out.  Turn on the lights when you go into a dark area. Use night-lights.  Keep items that you use often in easy-to-reach places. Lower the shelves around your home if necessary.  Set up your furniture so you have a clear path. Avoid moving your furniture around.  Do not have throw rugs and other things on the floor that can make you trip.  Avoid walking on wet floors.  If any of your floors are uneven, fix them.  Add color or contrast paint or tape to clearly mark and help you see: ? Any grab bars or handrails. ? First and last steps of stairways. ? Where the edge of each step is.  If you use a stepladder: ? Make sure that it is fully opened. Do not climb a closed stepladder. ? Make sure that both sides of the stepladder are locked into place. ? Ask someone to hold the stepladder for you while you use it.  If there are any pets around you, be aware of where they are. What can I do in the bathroom?      Keep the floor dry. Clean up any water that spills onto the floor as soon as it happens.  Remove soap buildup in the tub or shower regularly.  Use non-skid mats or decals on the floor of the tub or shower.  Attach bath mats securely with double-sided, non-slip rug tape.  If you need to sit down in the shower, use a plastic, non-slip stool.  Install grab bars by the toilet and in the tub and shower. Do not use towel bars as grab bars. What can I do in the bedroom?  Make sure that you have a light  by your bed that is easy to reach.  Do not use any sheets or blankets that are too big for your bed. They should not hang down onto the floor.  Have a firm chair that has side arms. You can use this for support  while you get dressed. What can I do in the kitchen?  Clean up any spills right away.  If you need to reach something above you, use a strong step stool that has a grab bar.  Keep electrical cords out of the way.  Do not use floor polish or wax that makes floors slippery. If you must use wax, use non-skid floor wax. What can I do with my stairs?  Do not leave any items on the stairs.  Make sure that you have a light switch at the top of the stairs and the bottom of the stairs. If you do not have them, ask someone to add them for you.  Make sure that there are handrails on both sides of the stairs, and use them. Fix handrails that are broken or loose. Make sure that handrails are as long as the stairways.  Install non-slip stair treads on all stairs in your home.  Avoid having throw rugs at the top or bottom of the stairs. If you do have throw rugs, attach them to the floor with carpet tape.  Choose a carpet that does not hide the edge of the steps on the stairway.  Check any carpeting to make sure that it is firmly attached to the stairs. Fix any carpet that is loose or worn. What can I do on the outside of my home?  Use bright outdoor lighting.  Regularly fix the edges of walkways and driveways and fix any cracks.  Remove anything that might make you trip as you walk through a door, such as a raised step or threshold.  Trim any bushes or trees on the path to your home.  Regularly check to see if handrails are loose or broken. Make sure that both sides of any steps have handrails.  Install guardrails along the edges of any raised decks and porches.  Clear walking paths of anything that might make someone trip, such as tools or rocks.  Have any leaves, snow, or ice  cleared regularly.  Use sand or salt on walking paths during winter.  Clean up any spills in your garage right away. This includes grease or oil spills. What other actions can I take?  Wear shoes that: ? Have a low heel. Do not wear high heels. ? Have rubber bottoms. ? Are comfortable and fit you well. ? Are closed at the toe. Do not wear open-toe sandals.  Use tools that help you move around (mobility aids) if they are needed. These include: ? Canes. ? Walkers. ? Scooters. ? Crutches.  Review your medicines with your doctor. Some medicines can make you feel dizzy. This can increase your chance of falling. Ask your doctor what other things you can do to help prevent falls. Where to find more information  Centers for Disease Control and Prevention, STEADI: https://garcia.biz/  [employer] on Aging: BrainJudge.co.uk Contact a doctor if:  You are afraid of falling at home.  You feel weak, drowsy, or dizzy at home.  You fall at home. Summary  There are many simple things that you can do to make your home safe and to help prevent falls.  Ways to make your home safe include removing tripping hazards and installing grab bars in the bathroom.  Ask for help when making these changes in your home. This information is not intended to replace advice given to you by your health care provider. Make sure you discuss any questions you have with your health care provider. Document Revised: 09/20/2018  Document Reviewed: 01/12/2017 Elsevier Patient Education  [place] Maintenance, Female Adopting a healthy lifestyle and getting preventive care are important in promoting health and wellness. Ask your health care provider about:  The right schedule for you to have regular tests and exams.  Things you can do on your own to prevent diseases and keep yourself healthy. What should I know about diet, weight, and exercise? Eat a healthy diet   Eat a diet  that includes plenty of vegetables, fruits, low-fat dairy products, and lean protein.  Do not eat a lot of foods that are high in solid fats, added sugars, or sodium. Maintain a healthy weight Body mass index (BMI) is used to identify weight problems. It estimates body fat based on height and weight. Your health care provider can help determine your BMI and help you achieve or maintain a healthy weight. Get regular exercise Get regular exercise. This is one of the most important things you can do for your health. Most adults should:  Exercise for at least 150 minutes each week. The exercise should increase your heart rate and make you sweat (moderate-intensity exercise).  Do strengthening exercises at least twice a week. This is in addition to the moderate-intensity exercise.  Spend less time sitting. Even light physical activity can be beneficial. Watch cholesterol and blood lipids Have your blood tested for lipids and cholesterol at 59 years of age, then have this test every 5 years. Have your cholesterol levels checked more often if:  Your lipid or cholesterol levels are high.  You are older than 59 years of age.  You are at high risk for heart disease. What should I know about cancer screening? Depending on your health history and family history, you may need to have cancer screening at various ages. This may include screening for:  Breast cancer.  Cervical cancer.  Colorectal cancer.  Skin cancer.  Lung cancer. What should I know about heart disease, diabetes, and high blood pressure? Blood pressure and heart disease  High blood pressure causes heart disease and increases the risk of stroke. This is more likely to develop in people who have high blood pressure readings, are of African descent, or are overweight.  Have your blood pressure checked: ? Every 3-5 years if you are 45-49 years of age. ? Every year if you are 68 years old or older. Diabetes Have regular  diabetes screenings. This checks your fasting blood sugar level. Have the screening done:  Once every three years after age 8 if you are at a normal weight and have a low risk for diabetes.  More often and at a younger age if you are overweight or have a high risk for diabetes. What should I know about preventing infection? Hepatitis B If you have a higher risk for hepatitis B, you should be screened for this virus. Talk with your health care provider to find out if you are at risk for hepatitis B infection. Hepatitis C Testing is recommended for:  Everyone born from [birth year] through 1965.  Anyone with known risk factors for hepatitis C. Sexually transmitted infections (STIs)  Get screened for STIs, including gonorrhea and chlamydia, if: ? You are sexually active and are younger than 59 years of age. ? You are older than 59 years of age and your health care provider tells you that you are at risk for this type of infection. ? Your sexual activity has changed since you were last screened, and you are  at increased risk for chlamydia or gonorrhea. Ask your health care provider if you are at risk.  Ask your health care provider about whether you are at high risk for HIV. Your health care provider may recommend a prescription medicine to help prevent HIV infection. If you choose to take medicine to prevent HIV, you should first get tested for HIV. You should then be tested every 3 months for as long as you are taking the medicine. Pregnancy  If you are about to stop having your period (premenopausal) and you may become pregnant, seek counseling before you get pregnant.  Take 400 to 800 micrograms (mcg) of folic acid every day if you become pregnant.  Ask for birth control (contraception) if you want to prevent pregnancy. Osteoporosis and menopause Osteoporosis is a disease in which the bones lose minerals and strength with aging. This can result in bone fractures. If you are 70 years old or  older, or if you are at risk for osteoporosis and fractures, ask your health care provider if you should:  Be screened for bone loss.  Take a calcium or vitamin D supplement to lower your risk of fractures.  Be given hormone replacement therapy (HRT) to treat symptoms of menopause. Follow these instructions at home: Lifestyle  Do not use any products that contain nicotine or tobacco, such as cigarettes, e-cigarettes, and chewing tobacco. If you need help quitting, ask your health care provider.  Do not use street drugs.  Do not share needles.  Ask your health care provider for help if you need support or information about quitting drugs. Alcohol use  Do not drink alcohol if: ? Your health care provider tells you not to drink. ? You are pregnant, may be pregnant, or are planning to become pregnant.  If you drink alcohol: ? Limit how much you use to 0-1 drink a day. ? Limit intake if you are breastfeeding.  Be aware of how much alcohol is in your drink. In the [place] one drink equals one 12 oz bottle of beer (355 mL), one 5 oz glass of wine (148 mL), or one 1 oz glass of hard liquor (44 mL). General instructions  Schedule regular health, dental, and eye exams.  Stay current with your vaccines.  Tell your health care provider if: ? You often feel depressed. ? You have ever been abused or do not feel safe at home. Summary  Adopting a healthy lifestyle and getting preventive care are important in promoting health and wellness.  Follow your health care provider's instructions about healthy diet, exercising, and getting tested or screened for diseases.  Follow your health care provider's instructions on monitoring your cholesterol and blood pressure. This information is not intended to replace advice given to you by your health care provider. Make sure you discuss any questions you have with your health care provider. Document Revised: 05/23/2018 Document Reviewed:  05/23/2018 Elsevier Patient Education  2020 [employer].

## 2020-01-17 NOTE — Progress Notes (Addendum)
This AWV is being conducted by [place] only. The patient was located at home and I was located in [facility]. The patient's identity was confirmed using their DOB and current address. The patient or his/her legal guardian has consented to being evaluated through a telephone encounter and understands the associated risks (an examination cannot be done and the patient may need to come in for an appointment) / benefits (allows the patient to remain at home, decreasing exposure to coronavirus). I personally spent 46 minutes conducting the AWV.  Subjective:   [name] is a 59 y.o. female who presents for a Medicare Annual Wellness Visit.  The following items have been reviewed and updated today in the appropriate area in the EMR.   Health Risk Assessment  Height, weight, BMI, and BP Visual acuity if needed Depression screen Fall risk / safety level Advance directive discussion Medical and family history were reviewed and updated Updating list of other providers & suppliers Medication reconciliation, including over the counter medicines Cognitive screen Written screening schedule Risk Factor list Personalized health advice, risky behaviors, and treatment advice  Social History   Social History Narrative   Current Social History 01/17/2020        Patient lives with spouse in a home which is 1 story. There are not steps up to the entrance the patient uses.       Patient's method of transportation is personal car.      The highest level of education was some college.      The patient currently works part-time.      Identified important Relationships are God, husband, kids, grandkids       Pets : None       Interests / Fun: Drawing and nature       Current Stressors: Pain, Covid       Religious / Personal Beliefs: Christian       Other: None          Objective:    Vitals: Ht 5\' 4"  (1.626 m)    Wt 256 lb (116.1 kg)    BMI 43.94 kg/m  Vitals are patient  reported  Activities of Daily Living In your present state of health, do you have any difficulty performing the following activities: 01/08/2020 03/05/2019  Hearing? N N  Vision? Y N  Comment CAN NOT READ CLOSE UP -  Difficulty concentrating or making decisions? N N  Walking or climbing stairs? Y Y  Dressing or bathing? N N  Doing errands, shopping? N Y  Some recent data might be hidden    Goals Goals      Blood Pressure < 140/90      Exercise 3x per week (30 min per time) (pt-stated)      HEMOGLOBIN A1C < 7.0      LDL CALC < 100       Fall Risk Fall Risk  01/17/2020 01/08/2020 05/14/2019 03/05/2019 02/12/2019  Falls in the past year? 1 1 1 1 1   Number falls in past yr: 1 1 1 1 1   Injury with Fall? 0 0 1 1 1   Risk Factor Category  - - - - -  Comment - - - - -  Risk for fall due to : Impaired mobility - Impaired balance/gait Impaired mobility;Impaired balance/gait;History of fall(s) History of fall(s);Impaired balance/gait;Impaired mobility  Risk for fall due to: Comment - - - - -  Follow up Falls evaluation completed - Falls prevention discussed Falls prevention discussed  Falls prevention discussed   CDC Handout on Fall Prevention and Handout on Home Exercise Program, Access codes [email or web address] given/mailed to patient with exercise band.   Depression Screen PHQ 2/9 Scores 01/17/2020 01/08/2020 05/14/2019 05/14/2019  PHQ - 2 Score 0 1 1 1   PHQ- 9 Score 2 - - 3  Exception Documentation - - - -    OUDScreen In the past 3 months did you use your opioid medicines for other purposes, for example to help you sleep or to help with stress or worry? Not al all (0 points)     In the past 3 months did opioid medicines cause you to feel slowed down, sluggish, or sedated? Not al all (0 points)     In the past 3 months did opioid medicines cause you to lose interest in your usual activities? Not al all (0 points)     In the past 3 months did you worry about your use of opioid  medicines? Not al all (0 points)     A score of 3 or more is concerning for moderate to severe OUD and requires consultation with a physician.  Patient score = 0  Cognitive Testing Six-Item Cognitive Screener   "I would like to ask you some questions that ask you to use your memory. I am going to name three objects. Please wait until I say all three words, then repeat them. Remember what they are  because I am going to ask you to name them again in a few minutes. Please repeat these words for me: APPLE--TABLE--PENNY. (Interviewer may repeat names 3 times if necessary but repetition not scored.)  Did patient correctly repeat all three words? Yes - may proceed with screen  What year is this? Correct What month is this? Correct What day of the week is this? Correct  What were the three objects I asked you to remember?  Apple Correct  Table Correct  Penny Correct  Score one point for each incorrect answer.  A score of 2 or more points warrants additional investigation.  Patient's score 0     Assessment and Plan:    Patient will schedule eye exam with [facility].   Patient made a  goal to increase walking to 30 minutes a day 3 times weekly. Patient will continue to follow up with [facility] for diabetes management. Patient will begin seated and standing exercises with exercise band.    During the course of the visit the patient was educated and counseled about appropriate screening and preventive services as documented in the assessment and plan.  The printed AVS was given to the patient and included an updated screening schedule, a list of risk factors, and personalized health advice.        Higinio Roger, RN  01/17/2020

## 2020-01-17 NOTE — Progress Notes (Signed)
I discussed the AWV findings with the RN who conducted the visit. I was present in the office suite and immediately available to provide assistance and direction throughout the time the service was provided.   

## 2020-01-22 NOTE — Progress Notes (Signed)
Internal Medicine Clinic Attending  Case discussed with Dr. Basaraba. We reviewed the AWV findings.  I agree with the assessment, diagnosis, and plan of care documented in the AWV note.     

## 2020-01-23 ENCOUNTER — Other Ambulatory Visit: Payer: Self-pay | Admitting: Gastroenterology

## 2020-01-23 ENCOUNTER — Other Ambulatory Visit: Payer: Self-pay

## 2020-01-23 ENCOUNTER — Ambulatory Visit (HOSPITAL_COMMUNITY)
Admission: RE | Admit: 2020-01-23 | Discharge: 2020-01-23 | Disposition: A | Discharge status: Home / Self Care | Payer: PPO | Source: Ambulatory Visit | Attending: Gastroenterology | Admitting: Gastroenterology

## 2020-01-23 DIAGNOSIS — R1013 Epigastric pain: Secondary | ICD-10-CM

## 2020-01-23 MED ORDER — MORPHINE SULFATE (PF) 2 MG/ML IV SOLN
3.0000 mg | Freq: Once | INTRAVENOUS | Status: AC
  Administered 2020-01-23: 3 mg via INTRAVENOUS

## 2020-01-23 MED ORDER — TECHNETIUM TC 99M MEBROFENIN IV KIT
5.0800 | PACK | Freq: Once | INTRAVENOUS | Status: AC | PRN
  Administered 2020-01-23: 5.08 via INTRAVENOUS

## 2020-01-23 MED ORDER — MORPHINE SULFATE (PF) 2 MG/ML IV SOLN
INTRAVENOUS | Status: AC
  Filled 2020-01-23: qty 2

## 2020-01-23 NOTE — Discharge Instructions (Addendum)
01/23/2020    Procedure- NM Hepato W/EF   The sedative medications given during your procedure may have effects lasting up to 24 hours.  You may have some drowsiness, dizziness, difficulty with walking, making decisions and memory problems.  No activities should be taken that require your best effort during the next 24 hours.  For your safety it is important that you understand and follow these instructions.  1. Do not drive any form of motorized equipment such as cars, trucks, yard tractors, golf carts, motorcycles and mopeds. 2. Do not drink alcohol. 3. Do not operate machinery or equipment such as stove, lawnmower and chainsaw. 4. Do not make any important decisions such as signing any legal documents. 5. You may become dizzy, especially when changing positions.  Move slowly and take your time.  Avoid stairs if possible. 6. Resume your normal diet when you feel ready to eat.  If you feel queasy, drink small amounts of uncarbonated clear liquids such as tea, orange or apple juice, clear broth or Gatorade to avoid getting dehydrated.  Then progress to bland soup, toast or crackers, then resume your normal diet again.     These instructions have been explained and understood by me or by a responsible person.  I have received a copy to take home with me.  I have a responsible adult to drive me home and stay with me when I return home.  All of my questions have been answered for me or for a responsible person.

## 2020-01-24 ENCOUNTER — Other Ambulatory Visit: Payer: Self-pay | Admitting: Internal Medicine

## 2020-01-29 ENCOUNTER — Other Ambulatory Visit: Payer: Self-pay | Admitting: *Deleted

## 2020-01-29 MED ORDER — IRBESARTAN 150 MG PO TABS: 300.0000 mg | ORAL_TABLET | Freq: Every day | ORAL | 1 refills | Status: DC

## 2020-01-30 ENCOUNTER — Other Ambulatory Visit: Payer: Self-pay | Admitting: Internal Medicine

## 2020-01-30 DIAGNOSIS — A6004 Herpesviral vulvovaginitis: Secondary | ICD-10-CM

## 2020-01-31 DIAGNOSIS — M5136 Other intervertebral disc degeneration, lumbar region: Secondary | ICD-10-CM | POA: Diagnosis not present

## 2020-01-31 DIAGNOSIS — B009 Herpesviral infection, unspecified: Secondary | ICD-10-CM | POA: Diagnosis not present

## 2020-01-31 DIAGNOSIS — E559 Vitamin D deficiency, unspecified: Secondary | ICD-10-CM | POA: Diagnosis not present

## 2020-01-31 DIAGNOSIS — Z79899 Other long term (current) drug therapy: Secondary | ICD-10-CM | POA: Diagnosis not present

## 2020-01-31 DIAGNOSIS — Z6841 Body Mass Index (BMI) 40.0 and over, adult: Secondary | ICD-10-CM | POA: Diagnosis not present

## 2020-02-03 DIAGNOSIS — G4733 Obstructive sleep apnea (adult) (pediatric): Secondary | ICD-10-CM | POA: Diagnosis not present

## 2020-02-14 ENCOUNTER — Other Ambulatory Visit: Payer: Self-pay

## 2020-02-14 ENCOUNTER — Other Ambulatory Visit: Payer: Self-pay | Admitting: *Deleted

## 2020-02-14 DIAGNOSIS — Z20822 Contact with and (suspected) exposure to covid-19: Secondary | ICD-10-CM

## 2020-02-15 LAB — NOVEL CORONAVIRUS, NAA: SARS-CoV-2, NAA: NOT DETECTED

## 2020-02-24 ENCOUNTER — Telehealth: Payer: Self-pay | Admitting: Dietician

## 2020-02-24 NOTE — Telephone Encounter (Signed)
Left voicemail for return call  

## 2020-02-26 DIAGNOSIS — E109 Type 1 diabetes mellitus without complications: Secondary | ICD-10-CM | POA: Diagnosis not present

## 2020-03-02 DIAGNOSIS — E559 Vitamin D deficiency, unspecified: Secondary | ICD-10-CM | POA: Diagnosis not present

## 2020-03-02 DIAGNOSIS — Z6841 Body Mass Index (BMI) 40.0 and over, adult: Secondary | ICD-10-CM | POA: Diagnosis not present

## 2020-03-02 DIAGNOSIS — Z79899 Other long term (current) drug therapy: Secondary | ICD-10-CM | POA: Diagnosis not present

## 2020-03-02 DIAGNOSIS — M5136 Other intervertebral disc degeneration, lumbar region: Secondary | ICD-10-CM | POA: Diagnosis not present

## 2020-03-04 NOTE — Telephone Encounter (Addendum)
Discussed Continuous glucose monitor settings and sounds ( that bother her) on her freestyle Libre 2. She says she took herself off of semaglutide for 2 weeks after seeing a commercial that talked about it' side effects because of blurred vision. Her vision is still blurred so she intends to start it again this week. Encouraged her to call Dr. Zenia Resides office who she says she saw recently.

## 2020-03-05 ENCOUNTER — Encounter: Payer: Self-pay | Admitting: Dietician

## 2020-03-05 DIAGNOSIS — G4733 Obstructive sleep apnea (adult) (pediatric): Secondary | ICD-10-CM | POA: Diagnosis not present

## 2020-03-05 NOTE — Progress Notes (Signed)
Office Visit Note  Patient: [name]             Date of Birth: 10/09/1960           MRN: [id number]             PCP: Harvie Heck, MD Referring: Fredrich Romans, [place] Visit Date: 03/18/2020 Occupation: _0 @  Subjective:  New Patient (Initial Visit) (Abnormal labs)   History of Present Illness: [name] is a 59 y.o. female with history of osteoarthritis.  She states she has had lumbar spine issues for many years.  She underwent discectomy in 2012.  She had relief from that.  She continues to have some lower back pain.  She goes to pain management.  She states for the last year she has been experiencing increased joint pain.  She describes pain in her hands, hip joints, knee joints, ankles and feet.  She has noticed occasional feet swelling.  She states she had some regions x-rays done by her pain management doctor who was suspicious of  inflammatory arthritis and did some blood work.  As her blood work was abnormal she was referred to me for further evaluation. There is no history of oral ulcers, nasal ulcers, malar rash, photosensitivity, Raynaud's phenomenon. She gives history of dry mouth and dry eyes which she relates to her medication use. She also gives history of intermittent joint swelling.  Activities of Daily Living:  Patient reports morning stiffness for 24 hours.   Patient Reports nocturnal pain.  Difficulty dressing/grooming: Denies Difficulty climbing stairs: Reports Difficulty getting out of chair: Denies Difficulty using hands for taps, buttons, cutlery, and/or writing: Reports  Review of Systems  Constitutional: Negative for fatigue, night sweats, weight gain and weight loss.  HENT: Positive for mouth dryness. Negative for mouth sores, trouble swallowing, trouble swallowing and nose dryness.   Eyes: Positive for dryness. Negative for pain, redness and visual disturbance.  Respiratory: Positive for shortness of breath. Negative for cough and difficulty breathing.     Cardiovascular: Positive for swelling in legs/feet. Negative for chest pain, palpitations, hypertension and irregular heartbeat.  Gastrointestinal: Negative for blood in stool, constipation and diarrhea.  Endocrine: Positive for heat intolerance. Negative for increased urination.  Genitourinary: Negative for difficulty urinating and vaginal dryness.  Musculoskeletal: Positive for arthralgias, gait problem, joint pain, muscle weakness and morning stiffness. Negative for joint swelling, myalgias, muscle tenderness and myalgias.  Skin: Negative for color change, rash, hair loss, skin tightness, ulcers and sensitivity to sunlight.  Allergic/Immunologic: Negative for susceptible to infections.  Neurological: Positive for numbness and weakness. Negative for dizziness, memory loss and night sweats.  Hematological: Negative for bruising/bleeding tendency and swollen glands.  Psychiatric/Behavioral: Positive for sleep disturbance. Negative for depressed mood. The patient is not nervous/anxious.     PMFS History:  Patient Active Problem List   Diagnosis Date Noted  . Dyslipidemia 08/16/2019  . Fracture of base of fifth metatarsal bone of right foot at metaphyseal-diaphyseal junction with nonunion 05/07/2019  . OSA on CPAP 10/31/2018  . Stress incontinence in female 03/09/2017  . Hepatic steatosis 08/29/2016  . Herpes simplex 03/08/2016  . Diabetic neuropathy with neurologic complication ([place]) 66/11/3014  . Long term current use of opiate analgesic 07/21/2015  . Preventative health care 05/21/2015  . COPD GOLD II  05/31/2013  . Lung nodule < 6cm on CT 05/31/2013  . Chronic cough 12/07/2012  . Chronic pain syndrome 02/13/2012  . Lumbar radiculopathy 11/29/2011  . Type 2 diabetes mellitus with  diabetic neuropathy ([place]) 08/25/2011  . Back pain 11/15/2010  . Essential hypertension 07/07/2009  . GERD 01/08/2009  . Hyperlipidemia 12/09/2008  . Morbid obesity due to excess calories ([place]) 12/09/2008     Past Medical History:  Diagnosis Date  . Allergy   . Arthritis    back   . Asthma    AS CHILD  . Chronic back pain   . Chronic leg pain    due to back pain  . Cocaine abuse ([place])    in remission  . Diabetes ([place])    with neuropathy  . GERD (gastroesophageal reflux disease)   . Hyperlipidemia   . Hypertension   . Intraductal papilloma of left breast 02/18/2016  . Neuromuscular disorder (HCC)    neuropathy  . Postlaminectomy syndrome of lumbar region 12/07/2011  . RECTAL BLEEDING 12/09/2008   Annotation: s/p EGD 7/08 mild gastritis, s/p colonoscopy 7/08- benign polyp  s/p polypectomy and isolated diverticulum.  Qualifier: Diagnosis of  By: Ditzler RN, Debra    . Sleep apnea    CPAP    YEARS AGO DONE 1/2 YEARS AGO AND WAS TOLD DID NOT HAVE  . Tobacco abuse   . Uterine fibroid    s/p hysterectomy    Family History  Problem Relation Age of Onset  . Diabetes Father   . Heart disease Father   . Hypertension Father   . Diabetes Mother   . Cancer Mother        brain  . Hypertension Mother   . Kidney disease Sister   . Diabetes Sister   . Kidney cancer Sister   . Stroke Sister   . Diabetes Sister   . Hypertension Sister   . Diabetes Sister   . Hypertension Sister   . Obesity Son   . Colon cancer Neg Hx   . Colon polyps Neg Hx   . Esophageal cancer Neg Hx   . Rectal cancer Neg Hx   . Stomach cancer Neg Hx    Past Surgical History:  Procedure Laterality Date  . BACK SURGERY  2012   L5-S1 microendoscopic disectomy last surgery 06/2011  . BREAST BIOPSY     LEFT    01/19/16  . BREAST EXCISIONAL BIOPSY Left 02/2016  . BREAST REDUCTION SURGERY  1982  . CATARACT EXTRACTION Right 11/07/2019  . COLONOSCOPY    . HAND SURGERY     MIDDLE TRIGGER FINGER RIGHT SIDE  . RADIOACTIVE SEED GUIDED EXCISIONAL BREAST BIOPSY Left 02/18/2016   Procedure: LEFT RADIOACTIVE SEED GUIDED EXCISIONAL BREAST BIOPSY;  Surgeon: Alphonsa Overall, MD;  Location: [facility];  Service: General;  Laterality:  Left;  . REDUCTION MAMMAPLASTY Bilateral   . TONSILLECTOMY  2008  . TOTAL ABDOMINAL HYSTERECTOMY  05/24/2007   hysterectomy  . TUBAL LIGATION    . UPPER GASTROINTESTINAL ENDOSCOPY     Social History   Social History Narrative   Current Social History 01/17/2020        Patient lives with spouse in a home which is 1 story. There are not steps up to the entrance the patient uses.       Patient's method of transportation is personal car.      The highest level of education was some college.      The patient currently works part-time.      Identified important Relationships are God, husband, kids, grandkids       Pets : None       Interests / Fun: Drawing and nature  Current Stressors: Pain, Covid       Religious / Personal Beliefs: Christian       Other: None    Immunization History  Administered Date(s) Administered  . Influenza Split 03/11/2011, 03/02/2012  . Influenza Whole 02/12/2010  . Influenza,inj,Quad PF,6+ Mos 03/08/2013, 02/27/2014, 05/19/2015, 04/19/2016, 03/09/2017, 02/06/2018, 02/12/2019  . PFIZER SARS-COV-2 Vaccination 07/25/2019, 08/19/2019  . PPD Test 10/04/2010, 07/11/2011, 12/05/2012  . Pneumococcal Polysaccharide-23 01/13/2012, 07/18/2017  . Tdap 11/18/2010     Objective: Vital Signs: BP (!) 153/88 (BP Location: Right Arm, Patient Position: Sitting, Cuff Size: Normal)   Pulse 89   Ht 5' 3.5" (1.613 m)   Wt 251 lb (113.9 kg)   BMI 43.77 kg/m    Physical Exam Vitals and nursing note reviewed.  Constitutional:      Appearance: She is well-developed.  HENT:     Head: Normocephalic and atraumatic.  Eyes:     Conjunctiva/sclera: Conjunctivae normal.  Cardiovascular:     Rate and Rhythm: Normal rate and regular rhythm.     Heart sounds: Normal heart sounds.  Pulmonary:     Effort: Pulmonary effort is normal.     Breath sounds: Normal breath sounds.  Abdominal:     General: Bowel sounds are normal.     Palpations: Abdomen is soft.   Musculoskeletal:     Cervical back: Normal range of motion.  Lymphadenopathy:     Cervical: No cervical adenopathy.  Skin:    General: Skin is warm and dry.     Capillary Refill: Capillary refill takes less than 2 seconds.  Neurological:     Mental Status: She is alert and oriented to person, place, and time.  Psychiatric:        Behavior: Behavior normal.      Musculoskeletal Exam: C-spine was in good range of motion. Shoulder joints were in good range of motion. She had no tenderness over elbow joints or wrist joints. No synovitis was noted over wrist joint MCPs or PIPs. She had good range of motion of her hip joints. Knee joint extension was limited which could be because of her body habitus. There was no tenderness or synovitis over ankle joints or MTPs. She has lot of discomfort in her lumbar region.  CDAI Exam: CDAI Score: -- Patient Global: --; Provider Global: -- Swollen: --; Tender: -- Joint Exam 03/18/2020   No joint exam has been documented for this visit   There is currently no information documented on the homunculus. Go to the Rheumatology activity and complete the homunculus joint exam.  Investigation: No additional findings.  Imaging: No results found.  Recent Labs: Lab Results  Component Value Date   WBC 5.6 12/18/2019   HGB 13.5 12/18/2019   PLT 254.0 12/18/2019   NA 139 12/18/2019   K 3.6 12/18/2019   CL 100 12/18/2019   CO2 30 12/18/2019   GLUCOSE 174 (H) 12/18/2019   BUN 7 12/18/2019   CREATININE 0.60 12/18/2019   BILITOT 0.4 12/18/2019   ALKPHOS 64 12/18/2019   AST 39 (H) 12/18/2019   ALT 37 (H) 12/18/2019   PROT 7.9 12/18/2019   ALBUMIN 4.3 12/18/2019   CALCIUM 9.8 12/18/2019   GFRAA 100 05/14/2019   November 16, 2017 chest x-ray normal.  January 23, 2017 high-resolution CT negative for ILD.  A solitary pulmonary nodule was noted which was unchanged and benign.  Diffuse hepatic steatosis noted read by Dr. Polly Cobia  Speciality Comments: No  specialty comments available.  Procedures:  No  procedures performed Allergies: Lisinopril   Assessment / Plan:     Visit Diagnoses: Positive ANA,RNP antibody - 10/02/19:ANA+, dsDNA-, RNP 1.0, Smith-, Ro-, La-, RF 11, uric acid 6.4, ESR 17. She has no clinical features of autoimmune disease. She gives history of sicca symptoms and some joint pain. No synovitis was noted. I will obtainAVISE today.  Pain in both hands -she complains of discomfort in her hands. Plan: XR Hand 2 View Right, XR Hand 2 View Left. X-rays were consistent with osteoarthritis.  Chronic pain of both hips -she had good range of motion in her hip joints with some discomfort. Plan: XR HIPS BILAT W OR W/O PELVIS 3-4 VIEWS. X-rays were consistent with early osteoarthritis.  Chronic pain of both knees -she had discomfort in her bilateral knee joints without any warmth swelling or effusion. She is in complete  extension. Plan: XR KNEE 3 VIEW RIGHT, XR KNEE 3 VIEW LEFT. Bilateral moderate osteoarthritis and moderate chondromalacia patella was noted.  Pain in both feet-she gives history of discomfort in her feet and also intermittent swelling. No synovitis was noted on my examination today. I reviewed her x-rays done by Dr. Earleen Newport from 2021 which showed osteoarthritic changes. No erosive changes were noted.   DDD (degenerative disc disease), lumbar - Surgery in 2012. She continues to have lower back pain and has been going to pain management.  Chronic pain syndrome-she receives medications for pain management.  Long term current use of opiate analgesic  Closed fracture of base of fifth metatarsal bone of right foot at metaphyseal-diaphyseal junction with nonunion, subsequent encounter-she was treated with Dr. Earleen Newport in the past.  Essential hypertension-her blood pressure is elevated. She has been advised to monitor blood pressure closely. Weight loss diet and exercise was discussed.  Dyslipidemia  Type 2 diabetes mellitus with  diabetic neuropathy, with long-term current use of insulin (HCC)-not well controlled. Her glucose has been elevated.  Diabetic neuropathy with neurologic complication (HCC)  COPD GOLD II -followed by Dr. Melvyn Novas. She had a high-resolution CT in 2018 which was negative for ILD.  Lung nodule < 6cm on CT-high-resolution CT in 2018 showed a stable benign nodule.  OSA on CPAP-she has chronic insomnia.  Gastroesophageal reflux disease without esophagitis  Hepatic steatosis  Vitamin D deficiency  Herpes simplex - genital  Family history of rheumatoid arthritis - [place]  Educated about COVID-19 virus infection-she is fully vaccinated against COVID-19. I have advised her to get a booster. Use of mask, social distancing and hand hygiene was discussed.  Orders: Orders Placed This Encounter  Procedures  . XR Hand 2 View Right  . XR Hand 2 View Left  . XR HIPS BILAT W OR W/O PELVIS 3-4 VIEWS  . XR KNEE 3 VIEW RIGHT  . XR KNEE 3 VIEW LEFT   No orders of the defined types were placed in this encounter.    Follow-Up Instructions: Return for Positive ANA, osteoarthritis, DDD.   Bo Merino, MD  Note - This record has been created using Editor, commissioning.  Chart creation errors have been sought, but may not always  have been located. Such creation errors do not reflect on  the standard of medical care.

## 2020-03-11 DIAGNOSIS — G4730 Sleep apnea, unspecified: Secondary | ICD-10-CM | POA: Diagnosis not present

## 2020-03-11 DIAGNOSIS — R1013 Epigastric pain: Secondary | ICD-10-CM | POA: Diagnosis not present

## 2020-03-11 DIAGNOSIS — E119 Type 2 diabetes mellitus without complications: Secondary | ICD-10-CM | POA: Diagnosis not present

## 2020-03-18 ENCOUNTER — Ambulatory Visit: Payer: PPO | Admitting: Rheumatology

## 2020-03-18 ENCOUNTER — Other Ambulatory Visit: Payer: Self-pay

## 2020-03-18 ENCOUNTER — Ambulatory Visit: Payer: Self-pay

## 2020-03-18 ENCOUNTER — Encounter: Payer: Self-pay | Admitting: Rheumatology

## 2020-03-18 VITALS — BP 153/88 | HR 89 | Ht 63.5 in | Wt 251.0 lb

## 2020-03-18 DIAGNOSIS — G894 Chronic pain syndrome: Secondary | ICD-10-CM

## 2020-03-18 DIAGNOSIS — M25562 Pain in left knee: Secondary | ICD-10-CM

## 2020-03-18 DIAGNOSIS — Z79891 Long term (current) use of opiate analgesic: Secondary | ICD-10-CM | POA: Diagnosis not present

## 2020-03-18 DIAGNOSIS — M79642 Pain in left hand: Secondary | ICD-10-CM | POA: Diagnosis not present

## 2020-03-18 DIAGNOSIS — K219 Gastro-esophageal reflux disease without esophagitis: Secondary | ICD-10-CM

## 2020-03-18 DIAGNOSIS — M25561 Pain in right knee: Secondary | ICD-10-CM

## 2020-03-18 DIAGNOSIS — S99191K Other physeal fracture of right metatarsal, subsequent encounter for fracture with nonunion: Secondary | ICD-10-CM | POA: Diagnosis not present

## 2020-03-18 DIAGNOSIS — R911 Solitary pulmonary nodule: Secondary | ICD-10-CM

## 2020-03-18 DIAGNOSIS — E1149 Type 2 diabetes mellitus with other diabetic neurological complication: Secondary | ICD-10-CM

## 2020-03-18 DIAGNOSIS — I10 Essential (primary) hypertension: Secondary | ICD-10-CM

## 2020-03-18 DIAGNOSIS — M5136 Other intervertebral disc degeneration, lumbar region: Secondary | ICD-10-CM | POA: Diagnosis not present

## 2020-03-18 DIAGNOSIS — B009 Herpesviral infection, unspecified: Secondary | ICD-10-CM

## 2020-03-18 DIAGNOSIS — M79641 Pain in right hand: Secondary | ICD-10-CM | POA: Diagnosis not present

## 2020-03-18 DIAGNOSIS — G8929 Other chronic pain: Secondary | ICD-10-CM | POA: Diagnosis not present

## 2020-03-18 DIAGNOSIS — E785 Hyperlipidemia, unspecified: Secondary | ICD-10-CM

## 2020-03-18 DIAGNOSIS — M79671 Pain in right foot: Secondary | ICD-10-CM

## 2020-03-18 DIAGNOSIS — M25551 Pain in right hip: Secondary | ICD-10-CM

## 2020-03-18 DIAGNOSIS — R768 Other specified abnormal immunological findings in serum: Secondary | ICD-10-CM

## 2020-03-18 DIAGNOSIS — Z794 Long term (current) use of insulin: Secondary | ICD-10-CM

## 2020-03-18 DIAGNOSIS — Z7189 Other specified counseling: Secondary | ICD-10-CM

## 2020-03-18 DIAGNOSIS — M25552 Pain in left hip: Secondary | ICD-10-CM | POA: Diagnosis not present

## 2020-03-18 DIAGNOSIS — J449 Chronic obstructive pulmonary disease, unspecified: Secondary | ICD-10-CM

## 2020-03-18 DIAGNOSIS — E559 Vitamin D deficiency, unspecified: Secondary | ICD-10-CM

## 2020-03-18 DIAGNOSIS — E1140 Type 2 diabetes mellitus with diabetic neuropathy, unspecified: Secondary | ICD-10-CM | POA: Diagnosis not present

## 2020-03-18 DIAGNOSIS — Z9989 Dependence on other enabling machines and devices: Secondary | ICD-10-CM

## 2020-03-18 DIAGNOSIS — K760 Fatty (change of) liver, not elsewhere classified: Secondary | ICD-10-CM

## 2020-03-18 DIAGNOSIS — M79672 Pain in left foot: Secondary | ICD-10-CM

## 2020-03-18 DIAGNOSIS — M51369 Other intervertebral disc degeneration, lumbar region without mention of lumbar back pain or lower extremity pain: Secondary | ICD-10-CM

## 2020-03-18 DIAGNOSIS — Z8261 Family history of arthritis: Secondary | ICD-10-CM

## 2020-03-18 DIAGNOSIS — G4733 Obstructive sleep apnea (adult) (pediatric): Secondary | ICD-10-CM

## 2020-03-18 DIAGNOSIS — IMO0001 Reserved for inherently not codable concepts without codable children: Secondary | ICD-10-CM

## 2020-03-25 ENCOUNTER — Encounter: Payer: Self-pay | Admitting: Rheumatology

## 2020-03-25 DIAGNOSIS — R768 Other specified abnormal immunological findings in serum: Secondary | ICD-10-CM | POA: Diagnosis not present

## 2020-03-27 DIAGNOSIS — Z6841 Body Mass Index (BMI) 40.0 and over, adult: Secondary | ICD-10-CM | POA: Diagnosis not present

## 2020-03-27 DIAGNOSIS — E559 Vitamin D deficiency, unspecified: Secondary | ICD-10-CM | POA: Diagnosis not present

## 2020-03-27 DIAGNOSIS — Z79899 Other long term (current) drug therapy: Secondary | ICD-10-CM | POA: Diagnosis not present

## 2020-03-27 DIAGNOSIS — M5136 Other intervertebral disc degeneration, lumbar region: Secondary | ICD-10-CM | POA: Diagnosis not present

## 2020-03-31 DIAGNOSIS — G4733 Obstructive sleep apnea (adult) (pediatric): Secondary | ICD-10-CM | POA: Diagnosis not present

## 2020-04-03 NOTE — Progress Notes (Deleted)
Office Visit Note  Patient: [name]             Date of Birth: 1960/10/09           MRN: [id number]             PCP: Harvie Heck, MD Referring: Harvie Heck, MD Visit Date: 04/09/2020 Occupation: @GUAROCC @  Subjective:  No chief complaint on file.   History of Present Illness: [name] is a 59 y.o. female ***   Activities of Daily Living:  Patient reports morning stiffness for *** {minute/hour:19697}.   Patient {ACTIONS;DENIES/REPORTS:21021675::"Denies"} nocturnal pain.  Difficulty dressing/grooming: {ACTIONS;DENIES/REPORTS:21021675::"Denies"} Difficulty climbing stairs: {ACTIONS;DENIES/REPORTS:21021675::"Denies"} Difficulty getting out of chair: {ACTIONS;DENIES/REPORTS:21021675::"Denies"} Difficulty using hands for taps, buttons, cutlery, and/or writing: {ACTIONS;DENIES/REPORTS:21021675::"Denies"}  No Rheumatology ROS completed.   PMFS History:  Patient Active Problem List   Diagnosis Date Noted  . Dyslipidemia 08/16/2019  . Fracture of base of fifth metatarsal bone of right foot at metaphyseal-diaphyseal junction with nonunion 05/07/2019  . OSA on CPAP 10/31/2018  . Stress incontinence in female 03/09/2017  . Hepatic steatosis 08/29/2016  . Herpes simplex 03/08/2016  . Diabetic neuropathy with neurologic complication ([place]) 76/54/6503  . Long term current use of opiate analgesic 07/21/2015  . Preventative health care 05/21/2015  . COPD GOLD II  05/31/2013  . Lung nodule < 6cm on CT 05/31/2013  . Chronic cough 12/07/2012  . Chronic pain syndrome 02/13/2012  . Lumbar radiculopathy 11/29/2011  . Type 2 diabetes mellitus with diabetic neuropathy ([place]) 08/25/2011  . Back pain 11/15/2010  . Essential hypertension 07/07/2009  . GERD 01/08/2009  . Hyperlipidemia 12/09/2008  . Morbid obesity due to excess calories ([place]) 12/09/2008    Past Medical History:  Diagnosis Date  . Allergy   . Arthritis    back   . Asthma    AS CHILD  . Chronic back pain   . Chronic  leg pain    due to back pain  . Cocaine abuse ([place])    in remission  . Diabetes ([place])    with neuropathy  . GERD (gastroesophageal reflux disease)   . Hyperlipidemia   . Hypertension   . Intraductal papilloma of left breast 02/18/2016  . Neuromuscular disorder (HCC)    neuropathy  . Postlaminectomy syndrome of lumbar region 12/07/2011  . RECTAL BLEEDING 12/09/2008   Annotation: s/p EGD 7/08 mild gastritis, s/p colonoscopy 7/08- benign polyp  s/p polypectomy and isolated diverticulum.  Qualifier: Diagnosis of  By: Ditzler RN, Debra    . Sleep apnea    CPAP    YEARS AGO DONE 1/2 YEARS AGO AND WAS TOLD DID NOT HAVE  . Tobacco abuse   . Uterine fibroid    s/p hysterectomy    Family History  Problem Relation Age of Onset  . Diabetes Father   . Heart disease Father   . Hypertension Father   . Diabetes Mother   . Cancer Mother        brain  . Hypertension Mother   . Kidney disease Sister   . Diabetes Sister   . Kidney cancer Sister   . Stroke Sister   . Diabetes Sister   . Hypertension Sister   . Diabetes Sister   . Hypertension Sister   . Obesity Son   . Colon cancer Neg Hx   . Colon polyps Neg Hx   . Esophageal cancer Neg Hx   . Rectal cancer Neg Hx   . Stomach cancer Neg Hx    Past  Surgical History:  Procedure Laterality Date  . BACK SURGERY  2012   L5-S1 microendoscopic disectomy last surgery 06/2011  . BREAST BIOPSY     LEFT    01/19/16  . BREAST EXCISIONAL BIOPSY Left 02/2016  . BREAST REDUCTION SURGERY  1982  . CATARACT EXTRACTION Right 11/07/2019  . COLONOSCOPY    . HAND SURGERY     MIDDLE TRIGGER FINGER RIGHT SIDE  . RADIOACTIVE SEED GUIDED EXCISIONAL BREAST BIOPSY Left 02/18/2016   Procedure: LEFT RADIOACTIVE SEED GUIDED EXCISIONAL BREAST BIOPSY;  Surgeon: Alphonsa Overall, MD;  Location: [facility];  Service: General;  Laterality: Left;  . REDUCTION MAMMAPLASTY Bilateral   . TONSILLECTOMY  2008  . TOTAL ABDOMINAL HYSTERECTOMY  05/24/2007   hysterectomy  . TUBAL  LIGATION    . UPPER GASTROINTESTINAL ENDOSCOPY     Social History   Social History Narrative   Current Social History 01/17/2020        Patient lives with spouse in a home which is 1 story. There are not steps up to the entrance the patient uses.       Patient's method of transportation is personal car.      The highest level of education was some college.      The patient currently works part-time.      Identified important Relationships are God, husband, kids, grandkids       Pets : None       Interests / Fun: Drawing and nature       Current Stressors: Pain, Covid       Religious / Personal Beliefs: Christian       Other: None    Immunization History  Administered Date(s) Administered  . Influenza Split 03/11/2011, 03/02/2012  . Influenza Whole 02/12/2010  . Influenza,inj,Quad PF,6+ Mos 03/08/2013, 02/27/2014, 05/19/2015, 04/19/2016, 03/09/2017, 02/06/2018, 02/12/2019  . PFIZER SARS-COV-2 Vaccination 07/25/2019, 08/19/2019  . PPD Test 10/04/2010, 07/11/2011, 12/05/2012  . Pneumococcal Polysaccharide-23 01/13/2012, 07/18/2017  . Tdap 11/18/2010     Objective: Vital Signs: There were no vitals taken for this visit.   Physical Exam   Musculoskeletal Exam: ***  CDAI Exam: CDAI Score: -- Patient Global: --; Provider Global: -- Swollen: --; Tender: -- Joint Exam 04/09/2020   No joint exam has been documented for this visit   There is currently no information documented on the homunculus. Go to the Rheumatology activity and complete the homunculus joint exam.  Investigation: No additional findings.  Imaging: XR HIPS BILAT W OR W/O PELVIS 3-4 VIEWS  Result Date: 03/18/2020 Mild superolateral narrowing and spurring was noted bilaterally. No SI joint changes were noted. Impression: These findings are consistent with early osteoarthritis of bilateral hip joints.  XR Hand 2 View Left  Result Date: 03/18/2020 CMC, PIP and DIP narrowing was noted. No MCP,  intercarpal radiocarpal joint space narrowing was noted. No erosive changes were noted. Impression: These findings are consistent with osteoarthritis of the hand.  XR Hand 2 View Right  Result Date: 03/18/2020 CMC, PIP and DIP narrowing was noted. No MCP, intercarpal or radiocarpal joint space narrowing was noted. Callus formation was noted in the right first metacarpal. No erosive changes were noted. Impression: These findings are consistent with osteoarthritis of the hand.  XR KNEE 3 VIEW LEFT  Result Date: 03/18/2020 Moderate medial compartment narrowing with intercondylar osteophytes was noted. Moderate patellofemoral narrowing was noted. No chondrocalcinosis was noted. Impression: These findings are consistent with moderate osteoarthritis and moderate chondromalacia patella.  XR KNEE 3 VIEW  RIGHT  Result Date: 03/18/2020 Moderate medial compartment narrowing with medial and intercondylar osteophytes was noted. No chondrocalcinosis was noted. Moderate patellofemoral narrowing was noted. Impression: These findings are consistent with moderate osteoarthritis and moderate chondromalacia patella.   Recent Labs: Lab Results  Component Value Date   WBC 5.6 12/18/2019   HGB 13.5 12/18/2019   PLT 254.0 12/18/2019   NA 139 12/18/2019   K 3.6 12/18/2019   CL 100 12/18/2019   CO2 30 12/18/2019   GLUCOSE 174 (H) 12/18/2019   BUN 7 12/18/2019   CREATININE 0.60 12/18/2019   BILITOT 0.4 12/18/2019   ALKPHOS 64 12/18/2019   AST 39 (H) 12/18/2019   ALT 37 (H) 12/18/2019   PROT 7.9 12/18/2019   ALBUMIN 4.3 12/18/2019   CALCIUM 9.8 12/18/2019   GFRAA 100 05/14/2019    Speciality Comments: No specialty comments available.  Procedures:  No procedures performed Allergies: Lisinopril   Assessment / Plan:     Visit Diagnoses: No diagnosis found.  Orders: No orders of the defined types were placed in this encounter.  No orders of the defined types were placed in this  encounter.   Face-to-face time spent with patient was *** minutes. Greater than 50% of time was spent in counseling and coordination of care.  Follow-Up Instructions: No follow-ups on file.   Bo Merino, MD  Note - This record has been created using Editor, commissioning.  Chart creation errors have been sought, but may not always  have been located. Such creation errors do not reflect on  the standard of medical care.

## 2020-04-04 DIAGNOSIS — G4733 Obstructive sleep apnea (adult) (pediatric): Secondary | ICD-10-CM | POA: Diagnosis not present

## 2020-04-07 ENCOUNTER — Telehealth: Payer: Self-pay

## 2020-04-07 NOTE — Telephone Encounter (Signed)
Called pt - stated is requesting records of when she had back surgery about 10 - 12 yrs ago at [facility]. Informed pt to call [place], she stated ok.

## 2020-04-07 NOTE — Telephone Encounter (Signed)
Pls contact pt regarding records/results [phone]

## 2020-04-09 ENCOUNTER — Ambulatory Visit: Payer: PPO | Admitting: Podiatry

## 2020-04-09 ENCOUNTER — Ambulatory Visit: Payer: PPO | Admitting: Rheumatology

## 2020-04-09 DIAGNOSIS — B009 Herpesviral infection, unspecified: Secondary | ICD-10-CM

## 2020-04-09 DIAGNOSIS — G894 Chronic pain syndrome: Secondary | ICD-10-CM

## 2020-04-09 DIAGNOSIS — R768 Other specified abnormal immunological findings in serum: Secondary | ICD-10-CM

## 2020-04-09 DIAGNOSIS — R911 Solitary pulmonary nodule: Secondary | ICD-10-CM

## 2020-04-09 DIAGNOSIS — M5136 Other intervertebral disc degeneration, lumbar region: Secondary | ICD-10-CM

## 2020-04-09 DIAGNOSIS — M160 Bilateral primary osteoarthritis of hip: Secondary | ICD-10-CM

## 2020-04-09 DIAGNOSIS — E785 Hyperlipidemia, unspecified: Secondary | ICD-10-CM

## 2020-04-09 DIAGNOSIS — E559 Vitamin D deficiency, unspecified: Secondary | ICD-10-CM

## 2020-04-09 DIAGNOSIS — G4733 Obstructive sleep apnea (adult) (pediatric): Secondary | ICD-10-CM

## 2020-04-09 DIAGNOSIS — I10 Essential (primary) hypertension: Secondary | ICD-10-CM

## 2020-04-09 DIAGNOSIS — K760 Fatty (change of) liver, not elsewhere classified: Secondary | ICD-10-CM

## 2020-04-09 DIAGNOSIS — S99191K Other physeal fracture of right metatarsal, subsequent encounter for fracture with nonunion: Secondary | ICD-10-CM

## 2020-04-09 DIAGNOSIS — K219 Gastro-esophageal reflux disease without esophagitis: Secondary | ICD-10-CM

## 2020-04-09 DIAGNOSIS — M19041 Primary osteoarthritis, right hand: Secondary | ICD-10-CM

## 2020-04-09 DIAGNOSIS — Z79891 Long term (current) use of opiate analgesic: Secondary | ICD-10-CM

## 2020-04-09 DIAGNOSIS — M170 Bilateral primary osteoarthritis of knee: Secondary | ICD-10-CM

## 2020-04-09 DIAGNOSIS — J449 Chronic obstructive pulmonary disease, unspecified: Secondary | ICD-10-CM

## 2020-04-09 DIAGNOSIS — Z8261 Family history of arthritis: Secondary | ICD-10-CM

## 2020-04-09 DIAGNOSIS — E1140 Type 2 diabetes mellitus with diabetic neuropathy, unspecified: Secondary | ICD-10-CM

## 2020-04-10 ENCOUNTER — Other Ambulatory Visit: Payer: Self-pay

## 2020-04-10 ENCOUNTER — Ambulatory Visit (INDEPENDENT_AMBULATORY_CARE_PROVIDER_SITE_OTHER): Payer: PPO | Admitting: Internal Medicine

## 2020-04-10 ENCOUNTER — Other Ambulatory Visit: Payer: Self-pay | Admitting: Internal Medicine

## 2020-04-10 ENCOUNTER — Encounter: Payer: Self-pay | Admitting: Internal Medicine

## 2020-04-10 VITALS — BP 158/80 | HR 79 | Temp 99.0°F | Ht 64.0 in | Wt 254.4 lb

## 2020-04-10 DIAGNOSIS — G8929 Other chronic pain: Secondary | ICD-10-CM | POA: Diagnosis not present

## 2020-04-10 DIAGNOSIS — M545 Low back pain, unspecified: Secondary | ICD-10-CM

## 2020-04-10 DIAGNOSIS — Z794 Long term (current) use of insulin: Secondary | ICD-10-CM | POA: Diagnosis not present

## 2020-04-10 DIAGNOSIS — Z23 Encounter for immunization: Secondary | ICD-10-CM | POA: Diagnosis not present

## 2020-04-10 DIAGNOSIS — I10 Essential (primary) hypertension: Secondary | ICD-10-CM

## 2020-04-10 DIAGNOSIS — Z0000 Encounter for general adult medical examination without abnormal findings: Secondary | ICD-10-CM | POA: Diagnosis not present

## 2020-04-10 DIAGNOSIS — E1140 Type 2 diabetes mellitus with diabetic neuropathy, unspecified: Secondary | ICD-10-CM | POA: Diagnosis not present

## 2020-04-10 LAB — POCT GLYCOSYLATED HEMOGLOBIN (HGB A1C): Hemoglobin A1C: 9.1 % — AB (ref 4.0–5.6)

## 2020-04-10 LAB — GLUCOSE, CAPILLARY: Glucose-Capillary: 204 mg/dL — ABNORMAL HIGH (ref 70–99)

## 2020-04-10 MED ORDER — DAPAGLIFLOZIN PROPANEDIOL 5 MG PO TABS: 5.0000 mg | ORAL_TABLET | Freq: Every day | ORAL | 2 refills | Status: DC

## 2020-04-10 MED ORDER — PREGABALIN 25 MG PO CAPS: 25.0000 mg | ORAL_CAPSULE | Freq: Three times a day (TID) | ORAL | 3 refills | Status: DC

## 2020-04-10 MED ORDER — NOVOLOG MIX 70/30 FLEXPEN (70-30) 100 UNIT/ML ~~LOC~~ SUPN: PEN_INJECTOR | SUBCUTANEOUS | 11 refills | Status: DC

## 2020-04-10 MED ORDER — OZEMPIC (1 MG/DOSE) 2 MG/1.5ML ~~LOC~~ SOPN: 1.0000 mg | PEN_INJECTOR | SUBCUTANEOUS | 2 refills | Status: DC

## 2020-04-10 NOTE — Patient Instructions (Addendum)
Ms Rutan,  It was a pleasure seeing you in clinic. Today we discussed:   Diabetes: Your A1c is 9.1 today. I am going to touch base with your endocrinologist, Dr Kelton Pillar, to discuss adding further medication.  Back pain: At this time, I will switch you from gabapentin to pregablin. Follow up with your pain clinic.   If you have any questions or concerns, please call our clinic at [phone] between 9am-5pm and after hours call [phone] and ask for the internal medicine resident on call. If you feel you are having a medical emergency please call 911.   Thank you, we look forward to helping you remain healthy!

## 2020-04-10 NOTE — Progress Notes (Signed)
   CC: diabetes and hypertension follow up  HPI:  [name] is a 59 y.o. female with PMHx as listed below presenting for follow up of her diabetes and hypertension. She does not have any acute concerns at this time. Please see problem based charting for complete assessment and plan.  Past Medical History:  Diagnosis Date  . Allergy   . Arthritis    back   . Asthma    AS CHILD  . Chronic back pain   . Chronic leg pain    due to back pain  . Cocaine abuse ([place])    in remission  . Diabetes ([place])    with neuropathy  . GERD (gastroesophageal reflux disease)   . Hyperlipidemia   . Hypertension   . Intraductal papilloma of left breast 02/18/2016  . Neuromuscular disorder (HCC)    neuropathy  . Postlaminectomy syndrome of lumbar region 12/07/2011  . RECTAL BLEEDING 12/09/2008   Annotation: s/p EGD 7/08 mild gastritis, s/p colonoscopy 7/08- benign polyp  s/p polypectomy and isolated diverticulum.  Qualifier: Diagnosis of  By: Ditzler RN, Debra    . Sleep apnea    CPAP    YEARS AGO DONE 1/2 YEARS AGO AND WAS TOLD DID NOT HAVE  . Tobacco abuse   . Uterine fibroid    s/p hysterectomy   Review of Systems:  Negative except as stated in HPI.  Physical Exam:  Vitals:   04/10/20 0928  BP: (!) 177/82  Pulse: 87  Temp: 99 F (37.2 C)  TempSrc: Oral  SpO2: 98%  Weight: 254 lb 6.4 oz (115.4 kg)  Height: 5\' 4"  (1.626 m)   Physical Exam  Constitutional: Appears well-developed and well-nourished. No distress.  HENT: Normocephalic and atraumatic, EOMI, conjunctiva normal, moist mucous membranes Cardiovascular: Normal rate, regular rhythm, S1 and S2 present, no murmurs, rubs, gallops.  Distal pulses intact Respiratory: No respiratory distress, no accessory muscle use.  Effort is normal.  Lungs are clear to auscultation bilaterally. GI: Nondistended, soft, nontender to palpation, normal active bowel sounds Musculoskeletal: Normal bulk and tone.  No peripheral edema noted. Neurological:  Is alert and oriented x4, no apparent focal deficits noted. Skin: Warm and dry.  No rash, erythema, lesions noted. Psychiatric: Normal mood and affect. Behavior is normal. Judgment and thought content normal.    Assessment & Plan:   See Encounters Tab for problem based charting.  Patient discussed with Dr. Jimmye Norman

## 2020-04-12 ENCOUNTER — Encounter (HOSPITAL_COMMUNITY): Payer: Self-pay

## 2020-04-12 ENCOUNTER — Emergency Department (HOSPITAL_COMMUNITY)
Admission: EM | Admit: 2020-04-12 | Discharge: 2020-04-12 | Disposition: A | Discharge status: Home / Self Care | Payer: PPO | Attending: Emergency Medicine | Admitting: Emergency Medicine

## 2020-04-12 ENCOUNTER — Other Ambulatory Visit: Payer: Self-pay

## 2020-04-12 DIAGNOSIS — J45909 Unspecified asthma, uncomplicated: Secondary | ICD-10-CM | POA: Diagnosis not present

## 2020-04-12 DIAGNOSIS — R202 Paresthesia of skin: Secondary | ICD-10-CM | POA: Diagnosis not present

## 2020-04-12 DIAGNOSIS — M5432 Sciatica, left side: Secondary | ICD-10-CM

## 2020-04-12 DIAGNOSIS — Z9851 Tubal ligation status: Secondary | ICD-10-CM | POA: Diagnosis not present

## 2020-04-12 DIAGNOSIS — E1140 Type 2 diabetes mellitus with diabetic neuropathy, unspecified: Secondary | ICD-10-CM | POA: Insufficient documentation

## 2020-04-12 DIAGNOSIS — M545 Low back pain, unspecified: Secondary | ICD-10-CM | POA: Diagnosis present

## 2020-04-12 DIAGNOSIS — M5442 Lumbago with sciatica, left side: Secondary | ICD-10-CM | POA: Diagnosis not present

## 2020-04-12 DIAGNOSIS — Z87891 Personal history of nicotine dependence: Secondary | ICD-10-CM | POA: Diagnosis not present

## 2020-04-12 DIAGNOSIS — I10 Essential (primary) hypertension: Secondary | ICD-10-CM | POA: Insufficient documentation

## 2020-04-12 MED ORDER — PREDNISONE 20 MG PO TABS
40.0000 mg | ORAL_TABLET | Freq: Every day | ORAL | 0 refills | Status: AC
Start: 2020-04-12 — End: 2020-04-16

## 2020-04-12 MED ORDER — PREDNISONE 20 MG PO TABS
60.0000 mg | ORAL_TABLET | Freq: Once | ORAL | Status: AC
  Administered 2020-04-12: 60 mg via ORAL
  Filled 2020-04-12: qty 3

## 2020-04-12 MED ORDER — METHOCARBAMOL 500 MG PO TABS
500.0000 mg | ORAL_TABLET | Freq: Three times a day (TID) | ORAL | 0 refills | Status: DC | PRN
Start: 2020-04-12 — End: 2021-03-03

## 2020-04-12 MED ORDER — METHOCARBAMOL 500 MG PO TABS
1000.0000 mg | ORAL_TABLET | Freq: Once | ORAL | Status: AC
  Administered 2020-04-12: 1000 mg via ORAL
  Filled 2020-04-12: qty 2

## 2020-04-12 NOTE — ED Provider Notes (Signed)
[facility] Emergency Department Provider Note MRN:  [id number]  Arrival date & time: 04/12/20     Chief Complaint   Back Pain   History of Present Illness   [name] is a 59 y.o. year-old female with a history of diabetes presenting to the ED with chief complaint of back pain.  Left sided lower back pain with radiation to the left buttocks and down the left leg.  Associated with paresthesias to the bilateral legs intermittently.  The symptoms present for the past 2 weeks.  Denies numbness or weakness to the arms or legs, no bowel or bladder dysfunction, no fever.  Pain is moderate, worse with motion or palpation.  Review of Systems  A complete 10 system review of systems was obtained and all systems are negative except as noted in the HPI and PMH.   Patient's Health History    Past Medical History:  Diagnosis Date  . Allergy   . Arthritis    back   . Asthma    AS CHILD  . Chronic back pain   . Chronic leg pain    due to back pain  . Cocaine abuse ([place])    in remission  . Diabetes ([place])    with neuropathy  . GERD (gastroesophageal reflux disease)   . Hyperlipidemia   . Hypertension   . Intraductal papilloma of left breast 02/18/2016  . Neuromuscular disorder (HCC)    neuropathy  . Postlaminectomy syndrome of lumbar region 12/07/2011  . RECTAL BLEEDING 12/09/2008   Annotation: s/p EGD 7/08 mild gastritis, s/p colonoscopy 7/08- benign polyp  s/p polypectomy and isolated diverticulum.  Qualifier: Diagnosis of  By: Ditzler RN, Debra    . Sleep apnea    CPAP    YEARS AGO DONE 1/2 YEARS AGO AND WAS TOLD DID NOT HAVE  . Tobacco abuse   . Uterine fibroid    s/p hysterectomy    Past Surgical History:  Procedure Laterality Date  . BACK SURGERY  2012   L5-S1 microendoscopic disectomy last surgery 06/2011  . BREAST BIOPSY     LEFT    01/19/16  . BREAST EXCISIONAL BIOPSY Left 02/2016  . BREAST REDUCTION SURGERY  1982  . CATARACT EXTRACTION Right  11/07/2019  . COLONOSCOPY    . HAND SURGERY     MIDDLE TRIGGER FINGER RIGHT SIDE  . RADIOACTIVE SEED GUIDED EXCISIONAL BREAST BIOPSY Left 02/18/2016   Procedure: LEFT RADIOACTIVE SEED GUIDED EXCISIONAL BREAST BIOPSY;  Surgeon: Alphonsa Overall, MD;  Location: [facility];  Service: General;  Laterality: Left;  . REDUCTION MAMMAPLASTY Bilateral   . TONSILLECTOMY  2008  . TOTAL ABDOMINAL HYSTERECTOMY  05/24/2007   hysterectomy  . TUBAL LIGATION    . UPPER GASTROINTESTINAL ENDOSCOPY      Family History  Problem Relation Age of Onset  . Diabetes Father   . Heart disease Father   . Hypertension Father   . Diabetes Mother   . Cancer Mother        brain  . Hypertension Mother   . Kidney disease Sister   . Diabetes Sister   . Kidney cancer Sister   . Stroke Sister   . Diabetes Sister   . Hypertension Sister   . Diabetes Sister   . Hypertension Sister   . Obesity Son   . Colon cancer Neg Hx   . Colon polyps Neg Hx   . Esophageal cancer Neg Hx   . Rectal cancer Neg Hx   .  Stomach cancer Neg Hx     Social History   Socioeconomic History  . Marital status: Married    Spouse name: Not on file  . Number of children: 3  . Years of education: Not on file  . Highest education level: Not on file  Occupational History  . 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    Employer: [employer]  Tobacco Use  . Smoking status: Former Smoker    Packs/day: 0.50    Years: 20.00    Pack years: 10.00    Types: Cigarettes    Quit date: 07/23/1996    Years since quitting: 23.7  . Smokeless tobacco: Never Used  Vaping Use  . Vaping Use: Never used  Substance and Sexual Activity  . Alcohol use: No    Alcohol/week: 0.0 standard drinks    Comment: recovering addict clean for 9 years  . Drug use: No    Types: Cocaine, Marijuana    Comment: recovering addict clean for 9 years  . Sexual activity: Yes    Birth control/protection: Surgical  Other Topics Concern  . Not on file  Social History  Narrative   Current Social History 01/17/2020        Patient lives with spouse in a home which is 1 story. There are not steps up to the entrance the patient uses.       Patient's method of transportation is personal car.      The highest level of education was some college.      The patient currently works part-time.      Identified important Relationships are God, husband, kids, grandkids       Pets : None       Interests / Fun: Drawing and nature       Current Stressors: Pain, Covid       Religious / Personal Beliefs: Christian       Other: None    Social Determinants of Health   Financial Resource Strain:   . Difficulty of Paying Living Expenses: Not on file  Food Insecurity:   . Worried About Charity fundraiser in the Last Year: Not on file  . Ran Out of Food in the Last Year: Not on file  Transportation Needs:   . Lack of Transportation (Medical): Not on file  . Lack of Transportation (Non-Medical): Not on file  Physical Activity:   . Days of Exercise per Week: Not on file  . Minutes of Exercise per Session: Not on file  Stress:   . Feeling of Stress : Not on file  Social Connections:   . Frequency of Communication with Friends and Family: Not on file  . Frequency of Social Gatherings with Friends and Family: Not on file  . Attends Religious Services: Not on file  . Active Member of Clubs or Organizations: Not on file  . Attends Archivist Meetings: Not on file  . Marital Status: Not on file  Intimate Partner Violence:   . Fear of Current or Ex-Partner: Not on file  . Emotionally Abused: Not on file  . Physically Abused: Not on file  . Sexually Abused: Not on file     Physical Exam   Vitals:   04/12/20 1102  BP: (!) 162/81  Pulse: 84  Resp: 18  Temp: 98.7 F (37.1 C)  SpO2: 95%    CONSTITUTIONAL: Well-appearing, NAD NEURO:  Alert and oriented x 3, no focal deficits EYES:  eyes equal and reactive ENT/NECK:  no LAD, no JVD CARDIO:  Regular rate, well-perfused, normal S1 and S2 PULM:  CTAB no wheezing or rhonchi GI/GU:  normal bowel sounds, non-distended, non-tender MSK/SPINE:  No gross deformities, no edema SKIN:  no rash, atraumatic PSYCH:  Appropriate speech and behavior  *Additional and/or pertinent findings included in MDM below  Diagnostic and Interventional Summary    EKG Interpretation  Date/Time:    Ventricular Rate:    PR Interval:    QRS Duration:   QT Interval:    QTC Calculation:   R Axis:     Text Interpretation:        Labs Reviewed - No data to display  No orders to display    Medications  methocarbamol (ROBAXIN) tablet 1,000 mg (has no administration in time range)  predniSONE (DELTASONE) tablet 60 mg (has no administration in time range)     Procedures  /  Critical Care Procedures  ED Course and Medical Decision Making  I have reviewed the triage vital signs, the nursing notes, and pertinent available records from the EMR.  Listed above are laboratory and imaging tests that I personally ordered, reviewed, and interpreted and then considered in my medical decision making (see below for details).  Symptoms consistent with sciatica, no hematuria or dysuria to suggest urinary cause, no bowel or bladder dysfunction, no numbness or weakness, nothing to suggest myelopathy, appropriate for discharge with symptomatic management, has follow-up early next week with orthopedics.       Barth Kirks. Sedonia Small, [place] [email or web address]  Final Clinical Impressions(s) / ED Diagnoses     ICD-10-CM   1. Sciatica of left side  M54.32     ED Discharge Orders         Ordered    methocarbamol (ROBAXIN) 500 MG tablet  Every 8 hours PRN        04/12/20 1220    predniSONE (DELTASONE) 20 MG tablet  Daily        04/12/20 1220           Discharge Instructions Discussed with and Provided to Patient:     Discharge Instructions     You  were evaluated in the Emergency Department and after careful evaluation, we did not find any emergent condition requiring admission or further testing in the hospital.  Your exam/testing today was overall reassuring.  Symptoms seem to be due to sciatica.  Please take the Robaxin medication as needed for pain.  Please take the prednisone medication as prescribed.  Please keep your follow-up with your pain specialist and your orthopedic specialist.  Please return to the Emergency Department if you experience any worsening of your condition.  Thank you for allowing [place] to be a part of your care.       Maudie Flakes, MD 04/12/20 [phone]

## 2020-04-12 NOTE — ED Triage Notes (Signed)
Patient c/o right back pain that radiates down both legs x 3 weeks and worse in the past 2 days.

## 2020-04-12 NOTE — Discharge Instructions (Addendum)
You were evaluated in the Emergency Department and after careful evaluation, we did not find any emergent condition requiring admission or further testing in the hospital.  Your exam/testing today was overall reassuring.  Symptoms seem to be due to sciatica.  Please take the Robaxin medication as needed for pain.  Please take the prednisone medication as prescribed.  Please keep your follow-up with your pain specialist and your orthopedic specialist.  Please return to the Emergency Department if you experience any worsening of your condition.  Thank you for allowing [place] to be a part of your care.

## 2020-04-13 NOTE — Progress Notes (Signed)
Office Visit Note  Patient: [name]             Date of Birth: [birth year]           MRN: [id number]             PCP: Harvie Heck, MD Referring: Harvie Heck, MD Visit Date: 04/15/2020 Occupation: @GUAROCC @  Subjective:  Pain in joints and positive ANA.   History of Present Illness: [name] is a 59 y.o. female with history of osteoarthritis and positive ANA.  She states she was seen in the emergency room recently for lower back pain and right-sided sciatica.  Symptoms have eased off.  She complains of some sicca symptoms with dry mouth.  There is no history of joint swelling.  Activities of Daily Living:  Patient reports morning stiffness for 5-15 minutes.   Patient Reports nocturnal pain.  Difficulty dressing/grooming: Reports Difficulty climbing stairs: Reports Difficulty getting out of chair: Reports Difficulty using hands for taps, buttons, cutlery, and/or writing: Reports  Review of Systems  Constitutional: Positive for fatigue.  HENT: Positive for mouth dryness. Negative for mouth sores and nose dryness.   Eyes: Positive for visual disturbance. Negative for pain, itching and dryness.  Respiratory: Negative for difficulty breathing.   Cardiovascular: Negative for chest pain and palpitations.  Gastrointestinal: Negative for blood in stool, constipation and diarrhea.  Endocrine: Negative for increased urination.  Genitourinary: Negative for difficulty urinating.  Musculoskeletal: Positive for arthralgias, joint pain, myalgias, morning stiffness, muscle tenderness and myalgias.  Skin: Negative for color change, rash and redness.  Allergic/Immunologic: Negative for susceptible to infections.  Neurological: Positive for numbness, headaches and memory loss. Negative for dizziness and weakness.  Hematological: Negative for bruising/bleeding tendency.  Psychiatric/Behavioral: Negative for confusion.    PMFS History:  Patient Active Problem List   Diagnosis Date Noted    . Dyslipidemia 08/16/2019  . Fracture of base of fifth metatarsal bone of right foot at metaphyseal-diaphyseal junction with nonunion 05/07/2019  . OSA on CPAP 10/31/2018  . Stress incontinence in female 03/09/2017  . Hepatic steatosis 08/29/2016  . Herpes simplex 03/08/2016  . Diabetic neuropathy with neurologic complication ([place]) 18/29/9371  . Long term current use of opiate analgesic 07/21/2015  . Preventative health care 05/21/2015  . COPD GOLD II  05/31/2013  . Lung nodule < 6cm on CT 05/31/2013  . Chronic cough 12/07/2012  . Chronic pain syndrome 02/13/2012  . Lumbar radiculopathy 11/29/2011  . Type 2 diabetes mellitus with diabetic neuropathy ([place]) 08/25/2011  . Back pain 11/15/2010  . Essential hypertension 07/07/2009  . GERD 01/08/2009  . Hyperlipidemia 12/09/2008  . Morbid obesity due to excess calories ([place]) 12/09/2008    Past Medical History:  Diagnosis Date  . Allergy   . Arthritis    back   . Asthma    AS CHILD  . Chronic back pain   . Chronic leg pain    due to back pain  . Cocaine abuse ([place])    in remission  . Diabetes ([place])    with neuropathy  . GERD (gastroesophageal reflux disease)   . Hyperlipidemia   . Hypertension   . Intraductal papilloma of left breast 02/18/2016  . Neuromuscular disorder (HCC)    neuropathy  . Postlaminectomy syndrome of lumbar region 12/07/2011  . RECTAL BLEEDING 12/09/2008   Annotation: s/p EGD 7/08 mild gastritis, s/p colonoscopy 7/08- benign polyp  s/p polypectomy and isolated diverticulum.  Qualifier: Diagnosis of  By: Ditzler RN, Debra    .  Sciatica    per patient   . Sleep apnea    CPAP    YEARS AGO DONE 1/2 YEARS AGO AND WAS TOLD DID NOT HAVE  . Tobacco abuse   . Uterine fibroid    s/p hysterectomy    Family History  Problem Relation Age of Onset  . Diabetes Father   . Heart disease Father   . Hypertension Father   . Diabetes Mother   . Cancer Mother        brain  . Hypertension Mother   . Kidney disease  Sister   . Diabetes Sister   . Kidney cancer Sister   . Stroke Sister   . Diabetes Sister   . Hypertension Sister   . Diabetes Sister   . Hypertension Sister   . Obesity Son   . Colon cancer Neg Hx   . Colon polyps Neg Hx   . Esophageal cancer Neg Hx   . Rectal cancer Neg Hx   . Stomach cancer Neg Hx    Past Surgical History:  Procedure Laterality Date  . BACK SURGERY  2012   L5-S1 microendoscopic disectomy last surgery 06/2011  . BREAST BIOPSY     LEFT    01/19/16  . BREAST EXCISIONAL BIOPSY Left 02/2016  . BREAST REDUCTION SURGERY  1982  . CATARACT EXTRACTION Right 11/07/2019  . COLONOSCOPY    . HAND SURGERY     MIDDLE TRIGGER FINGER RIGHT SIDE  . RADIOACTIVE SEED GUIDED EXCISIONAL BREAST BIOPSY Left 02/18/2016   Procedure: LEFT RADIOACTIVE SEED GUIDED EXCISIONAL BREAST BIOPSY;  Surgeon: Alphonsa Overall, MD;  Location: [facility];  Service: General;  Laterality: Left;  . REDUCTION MAMMAPLASTY Bilateral   . TONSILLECTOMY  2008  . TOTAL ABDOMINAL HYSTERECTOMY  05/24/2007   hysterectomy  . TUBAL LIGATION    . UPPER GASTROINTESTINAL ENDOSCOPY     Social History   Social History Narrative   Current Social History 01/17/2020        Patient lives with spouse in a home which is 1 story. There are not steps up to the entrance the patient uses.       Patient's method of transportation is personal car.      The highest level of education was some college.      The patient currently works part-time.      Identified important Relationships are God, husband, kids, grandkids       Pets : None       Interests / Fun: Drawing and nature       Current Stressors: Pain, Covid       Religious / Personal Beliefs: Christian       Other: None    Immunization History  Administered Date(s) Administered  . Influenza Split 03/11/2011, 03/02/2012  . Influenza Whole 02/12/2010  . Influenza,inj,Quad PF,6+ Mos 03/08/2013, 02/27/2014, 05/19/2015, 04/19/2016, 03/09/2017, 02/06/2018, 02/12/2019,  04/10/2020  . PFIZER SARS-COV-2 Vaccination 07/25/2019, 08/19/2019, 03/21/2020  . PPD Test 10/04/2010, 07/11/2011, 12/05/2012  . Pneumococcal Polysaccharide-23 01/13/2012, 07/18/2017  . Tdap 11/18/2010     Objective: Vital Signs: BP (!) 173/109 (BP Location: Left Arm, Patient Position: Sitting, Cuff Size: Normal)   Pulse 88   Resp 16   Ht 5' 4"  (1.626 m)   Wt 253 lb 9.6 oz (115 kg)   BMI 43.53 kg/m    Physical Exam Vitals and nursing note reviewed.  Constitutional:      Appearance: She is well-developed.  HENT:     Head: Normocephalic and  atraumatic.  Eyes:     Conjunctiva/sclera: Conjunctivae normal.  Cardiovascular:     Rate and Rhythm: Normal rate and regular rhythm.     Heart sounds: Normal heart sounds.  Pulmonary:     Effort: Pulmonary effort is normal.     Breath sounds: Normal breath sounds.  Abdominal:     General: Bowel sounds are normal.     Palpations: Abdomen is soft.  Musculoskeletal:     Cervical back: Normal range of motion.  Lymphadenopathy:     Cervical: No cervical adenopathy.  Skin:    General: Skin is warm and dry.     Capillary Refill: Capillary refill takes less than 2 seconds.     Comments: No sclerodactyly or nailbed capillary changes were noted.  Neurological:     Mental Status: She is alert and oriented to person, place, and time.  Psychiatric:        Behavior: Behavior normal.      Musculoskeletal Exam: She has stiffness with range of motion of her cervical spine.  She discomfort range of motion for lumbar spine.  Shoulder joints, elbow joints, wrist joints, MCPs PIPs and DIPs with good range of motion with no synovitis.  She had discomfort over bilateral trochanteric bursa.  She had discomfort range of motion of her knee joints without any warmth swelling or effusion.  She generalized hyperalgesia and positive tender points.  CDAI Exam: CDAI Score: -- Patient Global: --; Provider Global: -- Swollen: --; Tender: -- Joint Exam  04/15/2020   No joint exam has been documented for this visit   There is currently no information documented on the homunculus. Go to the Rheumatology activity and complete the homunculus joint exam.  Investigation: No additional findings.  Imaging: XR HIPS BILAT W OR W/O PELVIS 3-4 VIEWS  Result Date: 03/18/2020 Mild superolateral narrowing and spurring was noted bilaterally. No SI joint changes were noted. Impression: These findings are consistent with early osteoarthritis of bilateral hip joints.  XR Hand 2 View Left  Result Date: 03/18/2020 CMC, PIP and DIP narrowing was noted. No MCP, intercarpal radiocarpal joint space narrowing was noted. No erosive changes were noted. Impression: These findings are consistent with osteoarthritis of the hand.  XR Hand 2 View Right  Result Date: 03/18/2020 CMC, PIP and DIP narrowing was noted. No MCP, intercarpal or radiocarpal joint space narrowing was noted. Callus formation was noted in the right first metacarpal. No erosive changes were noted. Impression: These findings are consistent with osteoarthritis of the hand.  XR KNEE 3 VIEW LEFT  Result Date: 03/18/2020 Moderate medial compartment narrowing with intercondylar osteophytes was noted. Moderate patellofemoral narrowing was noted. No chondrocalcinosis was noted. Impression: These findings are consistent with moderate osteoarthritis and moderate chondromalacia patella.  XR KNEE 3 VIEW RIGHT  Result Date: 03/18/2020 Moderate medial compartment narrowing with medial and intercondylar osteophytes was noted. No chondrocalcinosis was noted. Moderate patellofemoral narrowing was noted. Impression: These findings are consistent with moderate osteoarthritis and moderate chondromalacia patella.   Recent Labs: Lab Results  Component Value Date   WBC 5.6 12/18/2019   HGB 13.5 12/18/2019   PLT 254.0 12/18/2019   NA 139 12/18/2019   K 3.6 12/18/2019   CL 100 12/18/2019   CO2 30 12/18/2019    GLUCOSE 174 (H) 12/18/2019   BUN 7 12/18/2019   CREATININE 0.60 12/18/2019   BILITOT 0.4 12/18/2019   ALKPHOS 64 12/18/2019   AST 39 (H) 12/18/2019   ALT 37 (H) 12/18/2019   PROT  7.9 12/18/2019   ALBUMIN 4.3 12/18/2019   CALCIUM 9.8 12/18/2019   GFRAA 100 05/14/2019   March 25, 2020 AVISE lupus index -1.2, ANA 1: 320 speckled, ENA negative, Jo 1 -, anticardiolipin negative, beta-2 GP 1 -, antiphosphatidylserine negative, RF negative, anti-CCP negative, anticar P+, antithyroglobulin negative, anti-TPO negative, antihistone negative  10/02/19:ANA+, dsDNA-, RNP 1.0, Smith-, Ro-, La-, RF 11, uric acid 6.4, ESR 17.   Speciality Comments: No specialty comments available.  Procedures:  No procedures performed Allergies: Lisinopril   Assessment / Plan:     Visit Diagnoses: Positive ANA (antinuclear antibody) - AIVSE lupus index -1.2, ANA 1: 320 speckled, ENA negative, (repeat RNP was negative).  Complements normal, anticarP positive.  History of sicca symptoms and joint pain.  She had no synovitis on examination.  At this point I do not think that she has any underlying autoimmune disease.  There is no history of oral ulcers, nasal ulcers, malar rash, Raynaud's phenomenon, joint swelling or lymphadenopathy.  I have advised her to contact me if she develops any new symptoms.  Left findings were reviewed at length.  Primary osteoarthritis of both hands-clinical and radiographic findings are consistent with osteoarthritis.  Joint protection muscle strengthening was discussed.  Primary osteoarthritis of both hips - Bilateral mild osteoarthritis.  She has chronic discomfort in her hip joints.  Primary osteoarthritis of both knees - Bilateral moderate osteoarthritis and moderate chondromalacia patella.  She complains of pain and discomfort in her bilateral knee joints.  DDD (degenerative disc disease), lumbar - Status post surgery 2012.  She goes to the pain management.  Patient states she was  recently seen in the emergency room for right-sided radiculopathy.  Chronic pain syndrome-she goes to pain management.  Long term current use of opiate analgesic  Closed fracture of base of fifth metatarsal bone of right foot at metaphyseal-diaphyseal junction with nonunion, subsequent encounter  Essential hypertension  Dyslipidemia  Type 2 diabetes mellitus with diabetic neuropathy, with long-term current use of insulin (HCC)  Diabetic neuropathy with neurologic complication (HCC)  COPD GOLD II   Lung nodule < 6cm on CT  OSA on CPAP  Gastroesophageal reflux disease without esophagitis  Hepatic steatosis  Vitamin D deficiency  Family history of rheumatoid arthritis  Orders: No orders of the defined types were placed in this encounter.  No orders of the defined types were placed in this encounter.    Follow-Up Instructions: Return in about 1 year (around 04/15/2021) for Osteoarthritis, +ANA.   Bo Merino, MD  Note - This record has been created using Editor, commissioning.  Chart creation errors have been sought, but may not always  have been located. Such creation errors do not reflect on  the standard of medical care.

## 2020-04-14 ENCOUNTER — Telehealth: Payer: Self-pay | Admitting: *Deleted

## 2020-04-14 ENCOUNTER — Other Ambulatory Visit: Payer: Self-pay | Admitting: Internal Medicine

## 2020-04-14 DIAGNOSIS — M5136 Other intervertebral disc degeneration, lumbar region: Secondary | ICD-10-CM | POA: Diagnosis not present

## 2020-04-14 DIAGNOSIS — M546 Pain in thoracic spine: Secondary | ICD-10-CM | POA: Diagnosis not present

## 2020-04-14 DIAGNOSIS — E119 Type 2 diabetes mellitus without complications: Secondary | ICD-10-CM | POA: Diagnosis not present

## 2020-04-14 DIAGNOSIS — M545 Low back pain, unspecified: Secondary | ICD-10-CM | POA: Diagnosis not present

## 2020-04-14 DIAGNOSIS — Z79899 Other long term (current) drug therapy: Secondary | ICD-10-CM | POA: Diagnosis not present

## 2020-04-14 DIAGNOSIS — M542 Cervicalgia: Secondary | ICD-10-CM | POA: Diagnosis not present

## 2020-04-14 DIAGNOSIS — E1140 Type 2 diabetes mellitus with diabetic neuropathy, unspecified: Secondary | ICD-10-CM

## 2020-04-14 DIAGNOSIS — M25552 Pain in left hip: Secondary | ICD-10-CM | POA: Diagnosis not present

## 2020-04-14 DIAGNOSIS — Z794 Long term (current) use of insulin: Secondary | ICD-10-CM

## 2020-04-14 DIAGNOSIS — M25551 Pain in right hip: Secondary | ICD-10-CM | POA: Diagnosis not present

## 2020-04-14 NOTE — Telephone Encounter (Addendum)
Information was sent through CoverMyMeds for PA for [employer].  Awaiting decision.  [employer] [phone].    Sander Nephew, RN 04/14/2020 3:37 PM.   Fax from [place] for [employer] was approved through 06/12/2021.  Sander Nephew, RN 04/15/2020 10:07 AM.

## 2020-04-15 ENCOUNTER — Ambulatory Visit: Payer: PPO | Admitting: Rheumatology

## 2020-04-15 ENCOUNTER — Other Ambulatory Visit: Payer: Self-pay

## 2020-04-15 ENCOUNTER — Encounter: Payer: Self-pay | Admitting: Rheumatology

## 2020-04-15 VITALS — BP 173/109 | HR 88 | Resp 16 | Ht 64.0 in | Wt 253.6 lb

## 2020-04-15 DIAGNOSIS — Z9989 Dependence on other enabling machines and devices: Secondary | ICD-10-CM

## 2020-04-15 DIAGNOSIS — E1149 Type 2 diabetes mellitus with other diabetic neurological complication: Secondary | ICD-10-CM

## 2020-04-15 DIAGNOSIS — M170 Bilateral primary osteoarthritis of knee: Secondary | ICD-10-CM | POA: Diagnosis not present

## 2020-04-15 DIAGNOSIS — K219 Gastro-esophageal reflux disease without esophagitis: Secondary | ICD-10-CM

## 2020-04-15 DIAGNOSIS — Z79891 Long term (current) use of opiate analgesic: Secondary | ICD-10-CM | POA: Diagnosis not present

## 2020-04-15 DIAGNOSIS — M19042 Primary osteoarthritis, left hand: Secondary | ICD-10-CM

## 2020-04-15 DIAGNOSIS — E1140 Type 2 diabetes mellitus with diabetic neuropathy, unspecified: Secondary | ICD-10-CM

## 2020-04-15 DIAGNOSIS — R768 Other specified abnormal immunological findings in serum: Secondary | ICD-10-CM

## 2020-04-15 DIAGNOSIS — M160 Bilateral primary osteoarthritis of hip: Secondary | ICD-10-CM

## 2020-04-15 DIAGNOSIS — M5136 Other intervertebral disc degeneration, lumbar region: Secondary | ICD-10-CM | POA: Diagnosis not present

## 2020-04-15 DIAGNOSIS — M19041 Primary osteoarthritis, right hand: Secondary | ICD-10-CM

## 2020-04-15 DIAGNOSIS — IMO0001 Reserved for inherently not codable concepts without codable children: Secondary | ICD-10-CM

## 2020-04-15 DIAGNOSIS — Z794 Long term (current) use of insulin: Secondary | ICD-10-CM

## 2020-04-15 DIAGNOSIS — J449 Chronic obstructive pulmonary disease, unspecified: Secondary | ICD-10-CM

## 2020-04-15 DIAGNOSIS — R911 Solitary pulmonary nodule: Secondary | ICD-10-CM

## 2020-04-15 DIAGNOSIS — K760 Fatty (change of) liver, not elsewhere classified: Secondary | ICD-10-CM

## 2020-04-15 DIAGNOSIS — G4733 Obstructive sleep apnea (adult) (pediatric): Secondary | ICD-10-CM

## 2020-04-15 DIAGNOSIS — E785 Hyperlipidemia, unspecified: Secondary | ICD-10-CM

## 2020-04-15 DIAGNOSIS — Z8261 Family history of arthritis: Secondary | ICD-10-CM

## 2020-04-15 DIAGNOSIS — M51369 Other intervertebral disc degeneration, lumbar region without mention of lumbar back pain or lower extremity pain: Secondary | ICD-10-CM

## 2020-04-15 DIAGNOSIS — G894 Chronic pain syndrome: Secondary | ICD-10-CM | POA: Diagnosis not present

## 2020-04-15 DIAGNOSIS — I10 Essential (primary) hypertension: Secondary | ICD-10-CM

## 2020-04-15 DIAGNOSIS — E559 Vitamin D deficiency, unspecified: Secondary | ICD-10-CM

## 2020-04-15 DIAGNOSIS — S99191K Other physeal fracture of right metatarsal, subsequent encounter for fracture with nonunion: Secondary | ICD-10-CM | POA: Diagnosis not present

## 2020-04-15 DIAGNOSIS — R7689 Other specified abnormal immunological findings in serum: Secondary | ICD-10-CM

## 2020-04-15 NOTE — Assessment & Plan Note (Signed)
Patient has history of chronic back pain radiating to bilateral thighs consistent with sciatica in setting of herniated lumbar disc s/p epidural injections. She follows at [facility]. However, notes that her pain is not currently controlled with gabapentin.  Plan: - Switch to pregablin 25mg  tid; titrate up as needed

## 2020-04-15 NOTE — Assessment & Plan Note (Signed)
Flu vaccine given at this visit. 

## 2020-04-15 NOTE — Assessment & Plan Note (Signed)
HbA1c is 9.1 at this visit (up from 8/8 in July 2021). [name] has been followed by an endocrinologist and has been on Novolin 70/30 84U bid, metformin 1000mg  bid and ozempic 1mg  weekly. Based on her CGM readings, average glucose of 186 with variability of 21. No hypoglycemic events. Patient is very distraught about her worsening A1c. Discussed with her endocrinologist. Recommended to increase Novolin 70/30 to 86U in AM and continue with 84U in PM. She also recommended initiation of SGLT-2 inhibitor. Patient has had history of yeast infections in the past with this. However, she is willing to try this again.  Plan: Increase Novolin 70/30 to 86U in AM and 84U in PM Start Farxiga 5mg  qd Continue metformin 1000mg  bid Continue Ozempic 1mg  weekly F/u in 3 months for A1c F/u with endocrinologist in January 2022 or sooner if needed

## 2020-04-16 ENCOUNTER — Ambulatory Visit (INDEPENDENT_AMBULATORY_CARE_PROVIDER_SITE_OTHER): Payer: PPO | Admitting: Internal Medicine

## 2020-04-16 ENCOUNTER — Other Ambulatory Visit: Payer: Self-pay

## 2020-04-16 ENCOUNTER — Telehealth: Payer: Self-pay | Admitting: *Deleted

## 2020-04-16 DIAGNOSIS — G8929 Other chronic pain: Secondary | ICD-10-CM

## 2020-04-16 DIAGNOSIS — M5441 Lumbago with sciatica, right side: Secondary | ICD-10-CM | POA: Diagnosis not present

## 2020-04-16 DIAGNOSIS — M5442 Lumbago with sciatica, left side: Secondary | ICD-10-CM | POA: Diagnosis not present

## 2020-04-16 DIAGNOSIS — M5416 Radiculopathy, lumbar region: Secondary | ICD-10-CM | POA: Diagnosis not present

## 2020-04-16 NOTE — Assessment & Plan Note (Signed)
Ms. Ertl called today and stated she was concerned about starting her pregabalin that was prescribed at her last visit, after reading the pamphlet that it came with.  She is particularly concerned about side effects that include suicidal ideation, homicidal ideation.  She feels the risks of taking this medication outweigh the benefit.  She does note that her sciatica pain has improved somewhat after finishing the prednisone taper today and with the muscle relaxer that she can take.  Assessment/plan: I counseled Ms. Severs that the side effects listed on pamphlet would not necessarily occur with her, especially given that she tolerated gabapentin for many years without any difficulty.  The best other option for her neuropathic pain is duloxetine, however suicidal ideation is also one of its side effects listed in the pamphlet, so we will hold off at this time.   At this time, Ms. Shadix is okay with tolerating her current pain level and not taking the pregabalin.  I encouraged that she call back the clinic should this change.

## 2020-04-16 NOTE — Progress Notes (Signed)
  [facility] Internal Medicine Residency Telephone Encounter Continuity Care Appointment  HPI:   This telephone encounter was created for [name] on 04/16/2020 for the following purpose/cc medication discussion.  Please see A&P for further details   Past Medical History:  Past Medical History:  Diagnosis Date  . Allergy   . Arthritis    back   . Asthma    AS CHILD  . Chronic back pain   . Chronic leg pain    due to back pain  . Cocaine abuse ([place])    in remission  . Diabetes ([place])    with neuropathy  . GERD (gastroesophageal reflux disease)   . Hyperlipidemia   . Hypertension   . Intraductal papilloma of left breast 02/18/2016  . Neuromuscular disorder (HCC)    neuropathy  . Postlaminectomy syndrome of lumbar region 12/07/2011  . RECTAL BLEEDING 12/09/2008   Annotation: s/p EGD 7/08 mild gastritis, s/p colonoscopy 7/08- benign polyp  s/p polypectomy and isolated diverticulum.  Qualifier: Diagnosis of  By: Ditzler RN, Debra    . Sciatica    per patient   . Sleep apnea    CPAP    YEARS AGO DONE 1/2 YEARS AGO AND WAS TOLD DID NOT HAVE  . Tobacco abuse   . Uterine fibroid    s/p hysterectomy      ROS:   + Back pain   Assessment / Plan / Recommendations:   Please see A&P under problem oriented charting for assessment of the patient's acute and chronic medical conditions.   As always, pt is advised that if symptoms worsen or new symptoms arise, they should go to an urgent care facility or to to ER for further evaluation.   Consent and Medical Decision Making:   Patient discussed with Dr. Jimmye Norman  This is a telephone encounter between [name] and Jose Persia on 04/16/2020 for medication discussion. The visit was conducted with the patient located at home and Jose Persia at [facility]. The patient's identity was confirmed using their DOB and current address. The patient has consented to being evaluated through a telephone encounter and understands the associated  risks (an examination cannot be done and the patient may need to come in for an appointment) / benefits (allows the patient to remain at home, decreasing exposure to coronavirus). I personally spent 12 minutes on medical discussion.

## 2020-04-16 NOTE — Telephone Encounter (Signed)
Patient called in stating she cannot afford Wilder Glade as it is $600. Explained that a PA was done through her insurance and was approved through 06/12/2021.  Also, states she will not start pregabalin 2/2 side effects listed on drug insert. Requesting alternative med. Tele appt given today with Yellow Team at 3:45 to discuss. Hubbard Hartshorn, BSN, RN-BC

## 2020-04-20 NOTE — Progress Notes (Signed)
Internal Medicine Clinic Attending  Case discussed with Dr. Charleen Kirks  At the time of the visit.  We reviewed the resident's history and I agree with the assessment, diagnosis, and plan of care documented in the resident's note.

## 2020-04-20 NOTE — Progress Notes (Signed)
Internal Medicine Clinic Attending ° °Case discussed with Dr. Aslam  At the time of the visit.  We reviewed the resident’s history and exam and pertinent patient test results.  I agree with the assessment, diagnosis, and plan of care documented in the resident’s note.  °

## 2020-04-21 ENCOUNTER — Other Ambulatory Visit: Payer: Self-pay

## 2020-04-21 ENCOUNTER — Ambulatory Visit: Payer: PPO | Admitting: Podiatry

## 2020-04-21 DIAGNOSIS — M79675 Pain in left toe(s): Secondary | ICD-10-CM

## 2020-04-21 DIAGNOSIS — M79674 Pain in right toe(s): Secondary | ICD-10-CM

## 2020-04-21 DIAGNOSIS — B351 Tinea unguium: Secondary | ICD-10-CM

## 2020-04-21 DIAGNOSIS — E1165 Type 2 diabetes mellitus with hyperglycemia: Secondary | ICD-10-CM

## 2020-04-21 NOTE — Progress Notes (Signed)
Subjective: 59 year old female presents the office today for thick, discolored toenails that she cannot trim herself. Denies any open lesions. She has been having back pain with radiating pain to the legs. She is awaiting MRI and is actively being treated for this.  A1c was 9.1 on 04/10/2020.  Objective: AAO x3, NAD DP/PT pulses palpable bilaterally, CRT less than 3 seconds Nails are hypertrophic, dystrophic, brittle, discolored, elongated 10. No surrounding redness or drainage. Tenderness nails 1-5 bilaterally. No open lesions or pre-ulcerative lesions are identified today. No area of tenderness identified to the feet today otherwise. No open lesions or pre-ulcerative lesions.  No pain with calf compression, swelling, warmth, erythema  Assessment: 59 year old female with uncontrolled diabetes, symptomatic onychomycosis  Plan: -All treatment options discussed with the patient including all alternatives, risks, complications.  -Nails debrided x10 without any complications or bleeding -Discussed daily foot inspection -Continue supportive shoe gear.  Return in about 3 months   Trula Slade DPM

## 2020-04-23 DIAGNOSIS — M545 Low back pain, unspecified: Secondary | ICD-10-CM | POA: Diagnosis not present

## 2020-04-25 DIAGNOSIS — R768 Other specified abnormal immunological findings in serum: Secondary | ICD-10-CM | POA: Diagnosis not present

## 2020-04-28 DIAGNOSIS — M545 Low back pain, unspecified: Secondary | ICD-10-CM | POA: Diagnosis not present

## 2020-05-05 DIAGNOSIS — G4733 Obstructive sleep apnea (adult) (pediatric): Secondary | ICD-10-CM | POA: Diagnosis not present

## 2020-05-10 ENCOUNTER — Other Ambulatory Visit: Payer: Self-pay | Admitting: Internal Medicine

## 2020-05-12 DIAGNOSIS — M5416 Radiculopathy, lumbar region: Secondary | ICD-10-CM | POA: Diagnosis not present

## 2020-05-26 ENCOUNTER — Encounter: Payer: Self-pay | Admitting: Internal Medicine

## 2020-05-27 NOTE — Progress Notes (Signed)
HPI F former smoker followed for OSA, complicated by  HBP, COPD GOLD II, Hepatic steatosis, GERD, DM2, Lung nodule< 6 cm NPSG 08/11/17- AHI 17.7/ hr, desaturation to 85%, 255 lbs PFT- 01/31/17- moderate obst, insignif resp to BD, mild DLCO deficit -----------------------------------------------------------------------------------  05/29/2019- Virtual Visit via Telephone Note  History of Present Illness: [name] former smoker followed for OSA, complicated by  HBP, COPD GOLD II, Hepatic steatosis, GERD, DM2, Lung nodule< 6 cm,  Dr Melvyn Novas has seen her for cough, last on 01/31/2019  CPAP auto 10-20/ [employer] Blames short nights on insomnia. Says mask cuts around her nose. Says breathing is fine, using Symbicort at intervals when needed. Remote hx lung nodule not seen on CXR 1 yr ago. Had flu vax.  Observations/Objective: Download compliance 80%, AHI 0.4/ hr    Some short nights but used every night  Assessment and Plan: OSA- benefits. Plan refit mask, continue 5-15 Insomnia- discussed sleep hygiene. Ok to occasionally use otc sleep aid. Discuss if insufficient.  Follow Up Instructions: 1 year   05/28/20- 59 yoF former smoker followed for OSA, complicated by  HBP, COPD GOLD II, Hepatic steatosis, GERD, DM2, Sciatica, Morbid Obesity,  Dr Melvyn Novas has seen her for cough, last on 01/31/2019 CPAP auto 10-20/ [employer] Download- compliance 90%, AHI 0.5/ hr Body weight today- 252 lbs Covid vax- 3 Phizer Flu vax- had Comfortable with CPAP. Download reviewed with her. Notes more alert during the day when she uses it.  Over past 6 months more aware of mild wheeze and dyspnea climbing 8 steps at work. Denies dyspnea or wheeze at rest. Denies cough, phlegm, edema, palpitation.  Uses Symbicort about 1x every other day as a rescue inhaler. Med talk done. Not aware of any heart issues.  CXR 11/16/17- IMPRESSION: No active cardiopulmonary disease.  ROS-see HPI   + = positive Constitutional:    weight loss, night  sweats, fevers, chills, fatigue, lassitude. HEENT:    headaches, difficulty swallowing, tooth/dental problems, sore throat,       sneezing, itching, ear ache, +nasal congestion, post nasal drip, snoring CV:    chest pain, orthopnea, PND, +swelling in lower extremities, anasarca,                                  dizziness, palpitations Resp:  + shortness of breath with exertion or at rest.                productive cough,   non-productive cough, coughing up of blood.              change in color of mucus. + wheezing.   Skin:    rash or lesions. GI:  + heartburn, indigestion, +abdominal pain, nausea, vomiting, diarrhea,                 change in bowel habits, loss of appetite GU: dysuria, change in color of urine, no urgency or frequency.   flank pain. MS:   +joint pain, stiffness, decreased range of motion, back pain. Neuro-     nothing unusual Psych:  change in mood or affect.  depression or anxiety.   memory loss.  OBJ- Physical Exam General- Alert, Oriented, Affect-appropriate, Distress- none acute, + obese Skin- rash-none, lesions- none, excoriation- none Lymphadenopathy- none Head- atraumatic            Eyes- Gross vision intact, PERRLA, conjunctivae and secretions clear  Ears- Hearing, canals-normal            Nose- Clear, no-Septal dev, mucus, polyps, erosion, perforation             Throat- Mallampati IV , mucosa clear , drainage- none, tonsils- atrophic Neck- flexible , trachea midline, no stridor , thyroid nl, carotid no bruit Chest - symmetrical excursion , unlabored           Heart/CV- RRR , no murmur , no gallop  , no rub, nl s1 s2                           - JVD- none , edema- none, stasis changes- none, varices- none           Lung- clear to P&A, wheeze- none, cough- none , dullness-none, rub- none           Chest wall-  Abd-  Br/ Gen/ Rectal- Not done, not indicated Extrem- cyanosis- none, clubbing, none, atrophy- none, strength- nl Neuro- grossly intact to  observation

## 2020-05-28 ENCOUNTER — Ambulatory Visit (INDEPENDENT_AMBULATORY_CARE_PROVIDER_SITE_OTHER): Payer: PPO

## 2020-05-28 ENCOUNTER — Other Ambulatory Visit: Payer: Self-pay

## 2020-05-28 ENCOUNTER — Encounter: Payer: Self-pay | Admitting: Internal Medicine

## 2020-05-28 ENCOUNTER — Ambulatory Visit: Payer: PPO | Admitting: Internal Medicine

## 2020-05-28 VITALS — BP 130/70 | HR 99 | Temp 97.2°F | Ht 64.0 in | Wt 252.6 lb

## 2020-05-28 DIAGNOSIS — J449 Chronic obstructive pulmonary disease, unspecified: Secondary | ICD-10-CM | POA: Diagnosis not present

## 2020-05-28 DIAGNOSIS — G4733 Obstructive sleep apnea (adult) (pediatric): Secondary | ICD-10-CM | POA: Diagnosis not present

## 2020-05-28 DIAGNOSIS — M5136 Other intervertebral disc degeneration, lumbar region: Secondary | ICD-10-CM | POA: Diagnosis not present

## 2020-05-28 DIAGNOSIS — M545 Low back pain, unspecified: Secondary | ICD-10-CM | POA: Diagnosis not present

## 2020-05-28 DIAGNOSIS — M546 Pain in thoracic spine: Secondary | ICD-10-CM | POA: Diagnosis not present

## 2020-05-28 DIAGNOSIS — Z9989 Dependence on other enabling machines and devices: Secondary | ICD-10-CM | POA: Diagnosis not present

## 2020-05-28 DIAGNOSIS — Z6841 Body Mass Index (BMI) 40.0 and over, adult: Secondary | ICD-10-CM | POA: Diagnosis not present

## 2020-05-28 DIAGNOSIS — Z87891 Personal history of nicotine dependence: Secondary | ICD-10-CM

## 2020-05-28 DIAGNOSIS — M542 Cervicalgia: Secondary | ICD-10-CM | POA: Diagnosis not present

## 2020-05-28 DIAGNOSIS — Z79899 Other long term (current) drug therapy: Secondary | ICD-10-CM | POA: Diagnosis not present

## 2020-05-28 MED ORDER — BUDESONIDE-FORMOTEROL FUMARATE 80-4.5 MCG/ACT IN AERO: INHALATION_SPRAY | RESPIRATORY_TRACT | 12 refills | Status: DC

## 2020-05-28 MED ORDER — ALBUTEROL SULFATE HFA 108 (90 BASE) MCG/ACT IN AERS
2.0000 | INHALATION_SPRAY | Freq: Four times a day (QID) | RESPIRATORY_TRACT | 12 refills | Status: DC | PRN
Start: 2020-05-28 — End: 2023-08-10

## 2020-05-28 NOTE — Patient Instructions (Addendum)
We can continue CPAP auto 10-20, mask of choice,e humidifier, supplies, AirView/ card  Order- CXR- dx former tobacco user  Script sent renewing Symbicort maintenance inhaler   Inhale 2 puffs then rinse mouth, twice daily, every day. Leave this on your bathroom sink.  Script sent for Ventolin albuterol rescue inhaler   Inhale 2 puffs up to every 6 hours, only if needed Carry this one with you.  Please call if we can help

## 2020-05-28 NOTE — Assessment & Plan Note (Signed)
Reactive airway component. COPD mixed type or Asthma/ COPD overlap. Component of deconditioning with her DOE. Has been using Symbicort as a rescue inhaler about every other day. Plan- refill Symbicort and albuterol hfa, with education maintenance vs rescue.

## 2020-05-28 NOTE — Assessment & Plan Note (Signed)
Benefits from CPAP with good compliance control Plan- continue auto 10-20

## 2020-06-04 DIAGNOSIS — G4733 Obstructive sleep apnea (adult) (pediatric): Secondary | ICD-10-CM | POA: Diagnosis not present

## 2020-06-12 ENCOUNTER — Other Ambulatory Visit: Payer: Self-pay | Admitting: Internal Medicine

## 2020-06-12 ENCOUNTER — Other Ambulatory Visit: Payer: Self-pay | Admitting: Student

## 2020-06-12 DIAGNOSIS — Z794 Long term (current) use of insulin: Secondary | ICD-10-CM

## 2020-06-12 DIAGNOSIS — E1140 Type 2 diabetes mellitus with diabetic neuropathy, unspecified: Secondary | ICD-10-CM

## 2020-06-19 DIAGNOSIS — G4733 Obstructive sleep apnea (adult) (pediatric): Secondary | ICD-10-CM | POA: Diagnosis not present

## 2020-07-02 ENCOUNTER — Other Ambulatory Visit: Payer: Self-pay

## 2020-07-02 DIAGNOSIS — Z79899 Other long term (current) drug therapy: Secondary | ICD-10-CM | POA: Diagnosis not present

## 2020-07-02 DIAGNOSIS — E119 Type 2 diabetes mellitus without complications: Secondary | ICD-10-CM | POA: Diagnosis not present

## 2020-07-02 DIAGNOSIS — M545 Low back pain, unspecified: Secondary | ICD-10-CM | POA: Diagnosis not present

## 2020-07-02 DIAGNOSIS — M546 Pain in thoracic spine: Secondary | ICD-10-CM | POA: Diagnosis not present

## 2020-07-02 DIAGNOSIS — Z6841 Body Mass Index (BMI) 40.0 and over, adult: Secondary | ICD-10-CM | POA: Diagnosis not present

## 2020-07-02 DIAGNOSIS — M542 Cervicalgia: Secondary | ICD-10-CM | POA: Diagnosis not present

## 2020-07-02 DIAGNOSIS — M5136 Other intervertebral disc degeneration, lumbar region: Secondary | ICD-10-CM | POA: Diagnosis not present

## 2020-07-05 DIAGNOSIS — G4733 Obstructive sleep apnea (adult) (pediatric): Secondary | ICD-10-CM | POA: Diagnosis not present

## 2020-07-06 ENCOUNTER — Other Ambulatory Visit: Payer: Self-pay

## 2020-07-06 ENCOUNTER — Ambulatory Visit: Payer: PPO | Admitting: Internal Medicine

## 2020-07-06 ENCOUNTER — Encounter: Payer: Self-pay | Admitting: Internal Medicine

## 2020-07-06 VITALS — BP 132/76 | HR 90 | Ht 64.0 in | Wt 249.0 lb

## 2020-07-06 DIAGNOSIS — Z794 Long term (current) use of insulin: Secondary | ICD-10-CM | POA: Diagnosis not present

## 2020-07-06 DIAGNOSIS — E1165 Type 2 diabetes mellitus with hyperglycemia: Secondary | ICD-10-CM | POA: Diagnosis not present

## 2020-07-06 DIAGNOSIS — E785 Hyperlipidemia, unspecified: Secondary | ICD-10-CM

## 2020-07-06 DIAGNOSIS — E1140 Type 2 diabetes mellitus with diabetic neuropathy, unspecified: Secondary | ICD-10-CM | POA: Diagnosis not present

## 2020-07-06 LAB — POCT GLYCOSYLATED HEMOGLOBIN (HGB A1C): Hemoglobin A1C: 8.3 % — AB (ref 4.0–5.6)

## 2020-07-06 LAB — BASIC METABOLIC PANEL
BUN: 11 mg/dL (ref 6–23)
CO2: 32 mEq/L (ref 19–32)
Calcium: 10.1 mg/dL (ref 8.4–10.5)
Chloride: 98 mEq/L (ref 96–112)
Creatinine, Ser: 0.73 mg/dL (ref 0.40–1.20)
GFR: 89.71 mL/min (ref >60.00)
Glucose, Bld: 236 mg/dL — ABNORMAL HIGH (ref 70–99)
Potassium: 3.9 mEq/L (ref 3.5–5.1)
Sodium: 138 mEq/L (ref 135–145)

## 2020-07-06 MED ORDER — DAPAGLIFLOZIN PROPANEDIOL 10 MG PO TABS: 10.0000 mg | ORAL_TABLET | Freq: Every day | ORAL | 6 refills | Status: DC

## 2020-07-06 NOTE — Progress Notes (Signed)
Name: [name]  Age/ Sex: 60 y.o., female   MRN/ DOB: [id number], Oct 19, 1960     PCP: Harvie Heck, MD   Reason for Endocrinology Evaluation: Type 2 Diabetes Mellitus  Initial Endocrine Consultative Visit: 03/18/2019    PATIENT IDENTIFIER: [name] is a 60 y.o. female with a past medical history of T2DM, HTN, OSA and dyslipidemia . The patient has followed with Endocrinology clinic since 03/18/2019 for consultative assistance with management of her diabetes.  DIABETIC HISTORY:  [name] was diagnosed with T2DM many years ago. Has been on Soliqua, Glipizide and V-Go was started in 2019.  Her hemoglobin A1c has ranged from 8.1% in 2016, peaking at 9.8% in 2020.  On her initial visit to our clinic, she was on V-Go 40 with Humulin U-500 , Metformin, and Ozempic with an A1c 9.7% . We stopped the V-Go  And the U-500 due to recurrent hypoglycemia . We started Novolog Mix and continued metformin and Ozempic.   SGLT-2 inhibitor started through PCP 03/2020  SUBJECTIVE:   During the last visit (01/03/2020): A1c 8.8% .  We increased her insulin regimen and ozempic and continued Metformin.     Today (07/06/2020): [name] is here for a follow up on her diabetes care.  She checks her blood sugars multiple times daily, through freestyle libre. The patient has not  had hypoglycemic episodes since the last clinic visit.   Denies nausea or diarrhea  No fever recently     HOME DIABETES REGIMEN:  Metformin 1000 tablet Twice daily  Ozempic 1 mg weekly  Novolog Mix (70/30) 86 units with breakfast and 83 units with Supper Farxiga 5 mg daily     CONTINUOUS GLUCOSE MONITORING RECORD INTERPRETATION    Dates of Recording:1/11-1/24/2022  Sensor description: [employer]  Results statistics:   CGM use % of time 78  Average and SD 182/22.6  Time in range  53 %  % Time Above 180 41  % Time above 250 6  % Time Below target 0    Glycemic patterns summary: Hyperglycemia noted after  lunch time, BG's optimal overnight  Hyperglycemic episodes post prandial   Hypoglycemic episodes occurred none Overnight periods: stable     HISTORY:  Past Medical History:  Past Medical History:  Diagnosis Date  . Allergy   . Arthritis    back   . Asthma    AS CHILD  . Chronic back pain   . Chronic leg pain    due to back pain  . Cocaine abuse ([place])    in remission  . Diabetes ([place])    with neuropathy  . GERD (gastroesophageal reflux disease)   . Hyperlipidemia   . Hypertension   . Intraductal papilloma of left breast 02/18/2016  . Neuromuscular disorder (HCC)    neuropathy  . Postlaminectomy syndrome of lumbar region 12/07/2011  . RECTAL BLEEDING 12/09/2008   Annotation: s/p EGD 7/08 mild gastritis, s/p colonoscopy 7/08- benign polyp  s/p polypectomy and isolated diverticulum.  Qualifier: Diagnosis of  By: Ditzler RN, Debra    . Sciatica    per patient   . Sleep apnea    CPAP    YEARS AGO DONE 1/2 YEARS AGO AND WAS TOLD DID NOT HAVE  . Tobacco abuse   . Uterine fibroid    s/p hysterectomy   Past Surgical History:  Past Surgical History:  Procedure Laterality Date  . BACK SURGERY  2012   L5-S1 microendoscopic disectomy last surgery 06/2011  .  BREAST BIOPSY     LEFT    01/19/16  . BREAST EXCISIONAL BIOPSY Left 02/2016  . BREAST REDUCTION SURGERY  1982  . CATARACT EXTRACTION Right 11/07/2019  . COLONOSCOPY    . HAND SURGERY     MIDDLE TRIGGER FINGER RIGHT SIDE  . RADIOACTIVE SEED GUIDED EXCISIONAL BREAST BIOPSY Left 02/18/2016   Procedure: LEFT RADIOACTIVE SEED GUIDED EXCISIONAL BREAST BIOPSY;  Surgeon: Alphonsa Overall, MD;  Location: [facility];  Service: General;  Laterality: Left;  . REDUCTION MAMMAPLASTY Bilateral   . TONSILLECTOMY  2008  . TOTAL ABDOMINAL HYSTERECTOMY  05/24/2007   hysterectomy  . TUBAL LIGATION    . UPPER GASTROINTESTINAL ENDOSCOPY      Social History:  reports that she quit smoking about 23 years ago. Her smoking use included cigarettes. She has a  10.00 pack-year smoking history. She has never used smokeless tobacco. She reports that she does not drink alcohol and does not use drugs. Family History:  Family History  Problem Relation Age of Onset  . Diabetes Father   . Heart disease Father   . Hypertension Father   . Diabetes Mother   . Cancer Mother        brain  . Hypertension Mother   . Kidney disease Sister   . Diabetes Sister   . Kidney cancer Sister   . Stroke Sister   . Diabetes Sister   . Hypertension Sister   . Diabetes Sister   . Hypertension Sister   . Obesity Son   . Colon cancer Neg Hx   . Colon polyps Neg Hx   . Esophageal cancer Neg Hx   . Rectal cancer Neg Hx   . Stomach cancer Neg Hx      HOME MEDICATIONS: Allergies as of 07/06/2020      Reactions   Lisinopril Cough      Medication List       Accurate as of July 06, 2020  8:48 AM. If you have any questions, ask your nurse or doctor.        STOP taking these medications   atorvastatin 80 MG tablet Commonly known as: LIPITOR Stopped by: Dorita Sciara, MD   pregabalin 25 MG capsule Commonly known as: Lyrica Stopped by: Dorita Sciara, MD     TAKE these medications   acyclovir 400 MG tablet Commonly known as: ZOVIRAX Take 1 tablet (400 mg total) by mouth 3 (three) times daily as needed (flares).   albuterol 108 (90 Base) MCG/ACT inhaler Commonly known as: VENTOLIN HFA Inhale 2 puffs into the lungs every 6 (six) hours as needed for wheezing or shortness of breath.   aspirin 81 MG EC tablet Take 81 mg by mouth daily.   budesonide-formoterol 80-4.5 MCG/ACT inhaler Commonly known as: Symbicort Inhale 2 puffs then rinse mouth, twice daily- maintenance   buprenorphine 5 MCG/HR Ptwk Commonly known as: BUTRANS 1 patch once a week.   chlorpheniramine 4 MG tablet Commonly known as: CHLOR-TRIMETON Take 4 mg by mouth every 4 (four) hours as needed for allergies.   Continuous Glucose Monitor Devi Use as directed.    dapagliflozin propanediol 5 MG Tabs tablet Commonly known as: Farxiga Take 1 tablet (5 mg total) by mouth daily before breakfast.   diclofenac Sodium 1 % Gel Commonly known as: VOLTAREN Apply 2 g topically 4 (four) times daily. Rub into affected area of foot 2 to 4 times daily   dicyclomine 10 MG capsule Commonly known as: BENTYL Take 1 capsule (  10 mg total) by mouth 3 (three) times daily before meals.   esomeprazole 40 MG capsule Commonly known as: NEXIUM Take 1 capsule (40 mg total) by mouth 2 (two) times daily before a meal.   FreeStyle Libre 14 Day Sensor Misc USE TO CHECK BLOOD SUGARS 4 TIMES DAILY   FreeStyle Libre 2 Reader Systm Devi 1 each by Does not apply route 4 (four) times daily.   GARLIC 3614 PO Take by mouth.   hydrochlorothiazide 25 MG tablet Commonly known as: HYDRODIURIL Take 1 tablet by mouth once daily   Insulin Pen Needle 32G X 4 MM Misc Use to inject insulin two times a day   INSULIN SYRINGE 1CC/30GX1/2" 30G X 1/2" 1 ML Misc Use to fill Vgo daily   irbesartan 150 MG tablet Commonly known as: Avapro Take 2 tablets (300 mg total) by mouth daily.   metFORMIN 1000 MG tablet Commonly known as: GLUCOPHAGE Take 1 tablet (1,000 mg total) by mouth 2 (two) times daily with a meal.   methocarbamol 500 MG tablet Commonly known as: ROBAXIN Take 1 tablet (500 mg total) by mouth every 8 (eight) hours as needed for muscle spasms.   montelukast 10 MG tablet Commonly known as: SINGULAIR Take 1 tablet (10 mg total) by mouth at bedtime.   multivitamin with minerals Tabs tablet Take 1 tablet by mouth daily.   Narcan 4 MG/0.1ML Liqd nasal spray kit Generic drug: naloxone USE 1 SPRAY IN THE NOSE AS NEEDED   NovoLOG Mix 70/30 FlexPen (70-30) 100 UNIT/ML FlexPen Generic drug: insulin aspart protamine - aspart 86 units in AM; 84 units in PM What changed: additional instructions   OneTouch Delica Lancets Fine Misc Check blood sugar 3 times a day    OneTouch Verio Flex System w/Device Kit 1 each by Does not apply route 3 (three) times daily.   OneTouch Verio test strip Generic drug: glucose blood CHECK BLOOD SUGAR 3 TIMES A DAY   oxyCODONE-acetaminophen 10-325 MG tablet Commonly known as: PERCOCET Take 1 tablet by mouth 4 (four) times daily as needed.   Ozempic (1 MG/DOSE) 4 MG/3ML Sopn Generic drug: Semaglutide (1 MG/DOSE) INJECT 1 MG INTO THE SKIN ONCE A WEEK.   tiZANidine 4 MG capsule Commonly known as: ZANAFLEX Take 4 mg by mouth 3 (three) times daily as needed.   valACYclovir 500 MG tablet Commonly known as: VALTREX TAKE 1 TABLET BY MOUTH TWICE DAILY FOR 5 DAYS AT  TIME  OF  OUTBREAK  AND  1  EVERYDAY  FOR  SUPPRESION        OBJECTIVE:   Vital Signs: BP 132/76   Pulse 90   Ht 5' 4"  (1.626 m)   Wt 249 lb (112.9 kg)   SpO2 96%   BMI 42.74 kg/m   Wt Readings from Last 3 Encounters:  07/06/20 249 lb (112.9 kg)  05/28/20 252 lb 9.6 oz (114.6 kg)  04/15/20 253 lb 9.6 oz (115 kg)     Exam: General: Pt appears well and is in NAD  Lungs: Clear with good BS bilat with no rales, rhonchi, or wheezes  Heart: RRR with normal S1 and S2 and no gallops; no murmurs; no rub  Extremities: No pretibial edema.   Skin: Normal texture and temperature to palpation.   Neuro: MS is good with appropriate affect, pt is alert and Ox3    DM foot exam: 07/06/2020  The skin of the feet is intact without sores or ulcerations. The pedal pulses are 2+ on  right and 2+ on left. The sensation is intact to a screening 5.07, 10 gram monofilament bilaterally          DATA REVIEWED:  Lab Results  Component Value Date   HGBA1C 8.3 (A) 07/06/2020   HGBA1C 9.1 (A) 04/10/2020   HGBA1C 8.8 (A) 01/03/2020   Lab Results  Component Value Date   MICROALBUR 0.99 09/30/2009   LDLCALC 91 08/16/2019   CREATININE 0.60 12/18/2019   Lab Results  Component Value Date   MICRALBCREAT 15 02/12/2019     Lab Results  Component Value  Date   CHOL 147 08/16/2019   HDL 36.30 (L) 08/16/2019   LDLCALC 91 08/16/2019   TRIG 99.0 08/16/2019   CHOLHDL 4 08/16/2019      Results for Crystal, [name] (MRN [id number]) as of 07/06/2020 12:39  Ref. Range 07/06/2020 09:04  Sodium Latest Ref Range: 135 - 145 mEq/L 138  Potassium Latest Ref Range: 3.5 - 5.1 mEq/L 3.9  Chloride Latest Ref Range: 96 - 112 mEq/L 98  CO2 Latest Ref Range: 19 - 32 mEq/L 32  Glucose Latest Ref Range: 70 - 99 mg/dL 236 (H)  BUN Latest Ref Range: 6 - 23 mg/dL 11  Creatinine Latest Ref Range: 0.40 - 1.20 mg/dL 0.73  Calcium Latest Ref Range: 8.4 - 10.5 mg/dL 10.1  GFR Latest Ref Range: >60.00 mL/min 89.71     ASSESSMENT / PLAN / RECOMMENDATIONS:   1) Type 2 Diabetes Mellitus, With improved glycemic control, With Neuropathy complications - Most recent A1c of 8.3  %. Goal A1c < 7.0 %.     - Improved glycemic control, she is tolerating [place] without side effects.  - In review of CGM download, she has been noted with hyperglycemia mainly at and after supper time, will increase Farxiga and increase supper time insulin dose as below   MEDICATIONS: - Continue Metformin 1000 mg,  1 tablet Twice daily  - Continue Ozempic 1 mg weekly  - Novolog Mix (70/30) 84 units with breakfast and 88 units with supper  - Increase Farxiga 10 mg daily    EDUCATION / INSTRUCTIONS:  BG monitoring instructions: Patient is instructed to check her blood sugars 2 times a day, fasting and bedtime .  Call [facility] [facility] clinic if: BG persistently < 70  . I reviewed the Rule of 15 for the treatment of hypoglycemia in detail with the patient. Literature supplied.    2.  Dyslipidemia:   - She was on Atorvastatin but pt requested change to Rosuvastatin due to leg pains.     Medication: Continue Crestor 20 mg daily    F/U in 4 months    Signed electronically by: Mack Guise, MD  [address] [facility] End[facility]  [facility] [address] [place], [place] [address] Phone: [phone] FAX: [phone]   CC: Harvie Heck, MD [address] [place] [address] Phone: [phone]  Fax: [phone]  Return to Endocrinology clinic as below: Future Appointments  Date Time Provider [place]  07/10/2020  9:15 AM Harvie Heck, MD [facility]  07/23/2020  8:45 AM Trula Slade, DPM [facility]  11/26/2020  9:00 AM Deneise Lever, MD [facility] None  04/14/2021  9:15 AM Bo Merino, MD [facility] None

## 2020-07-06 NOTE — Patient Instructions (Addendum)
-   Continue Metformin 1 tablet Twice daily  - Continue Ozempic 1.0 mg weekly  - Increase  Novolog Mix (70/30) 84 units with  breakfast and 88  units with Supper  - Increase Farxiga 10 mg , 1 tablet with breakfast      HOW TO TREAT LOW BLOOD SUGARS (Blood sugar LESS THAN 70 MG/DL)  Please follow the RULE OF 15 for the treatment of hypoglycemia treatment (when your (blood sugars are less than 70 mg/dL)    STEP 1: Take 15 grams of carbohydrates when your blood sugar is low, which includes:   3-4 GLUCOSE TABS  OR  3-4 OZ OF JUICE OR REGULAR SODA OR  ONE TUBE OF GLUCOSE GEL     STEP 2: RECHECK blood sugar in 15 MINUTES STEP 3: If your blood sugar is still low at the 15 minute recheck --> then, go back to STEP 1 and treat AGAIN with another 15 grams of carbohydrates.

## 2020-07-07 MED ORDER — DAPAGLIFLOZIN PROPANEDIOL 10 MG PO TABS: 10.0000 mg | ORAL_TABLET | Freq: Every day | ORAL | 6 refills | Status: DC

## 2020-07-07 NOTE — Addendum Note (Signed)
Addended by: Jacqualin Combes on: 07/07/2020 08:51 AM   Modules accepted: Orders

## 2020-07-10 ENCOUNTER — Encounter: Payer: Self-pay | Admitting: Internal Medicine

## 2020-07-10 ENCOUNTER — Ambulatory Visit (INDEPENDENT_AMBULATORY_CARE_PROVIDER_SITE_OTHER): Payer: PPO | Admitting: Internal Medicine

## 2020-07-10 ENCOUNTER — Other Ambulatory Visit: Payer: Self-pay

## 2020-07-10 VITALS — BP 147/77 | HR 86 | Temp 98.4°F | Ht 64.0 in | Wt 252.4 lb

## 2020-07-10 DIAGNOSIS — E785 Hyperlipidemia, unspecified: Secondary | ICD-10-CM | POA: Diagnosis not present

## 2020-07-10 DIAGNOSIS — R0600 Dyspnea, unspecified: Secondary | ICD-10-CM | POA: Diagnosis not present

## 2020-07-10 DIAGNOSIS — Z794 Long term (current) use of insulin: Secondary | ICD-10-CM | POA: Diagnosis not present

## 2020-07-10 DIAGNOSIS — R748 Abnormal levels of other serum enzymes: Secondary | ICD-10-CM | POA: Diagnosis not present

## 2020-07-10 DIAGNOSIS — I10 Essential (primary) hypertension: Secondary | ICD-10-CM | POA: Diagnosis not present

## 2020-07-10 DIAGNOSIS — E1140 Type 2 diabetes mellitus with diabetic neuropathy, unspecified: Secondary | ICD-10-CM | POA: Diagnosis not present

## 2020-07-10 DIAGNOSIS — R0609 Other forms of dyspnea: Secondary | ICD-10-CM

## 2020-07-10 DIAGNOSIS — M5416 Radiculopathy, lumbar region: Secondary | ICD-10-CM

## 2020-07-10 DIAGNOSIS — A6004 Herpesviral vulvovaginitis: Secondary | ICD-10-CM | POA: Diagnosis not present

## 2020-07-10 MED ORDER — NOVOLOG MIX 70/30 FLEXPEN (70-30) 100 UNIT/ML ~~LOC~~ SUPN: PEN_INJECTOR | SUBCUTANEOUS | 11 refills | Status: DC

## 2020-07-10 MED ORDER — VALACYCLOVIR HCL 500 MG PO TABS: ORAL_TABLET | ORAL | 0 refills | Status: DC

## 2020-07-10 NOTE — Patient Instructions (Addendum)
[name],  It was a pleasure seeing you in clinic. Today we discussed:   High blood pressure:  I am checking on your blood work and will call you with any medication changes that may be recommended.   Leg swelling and worsening shortness of breath: I am ordering an Echo of your heart to evaluate for any heart condition that may be causing this. Please schedule at your earliest convenience.   If you have any questions or concerns, please call our clinic at [phone] between 9am-5pm and after hours call [phone] and ask for the internal medicine resident on call. If you feel you are having a medical emergency please call 911.   Thank you, we look forward to helping you remain healthy!

## 2020-07-11 DIAGNOSIS — R0609 Other forms of dyspnea: Secondary | ICD-10-CM | POA: Insufficient documentation

## 2020-07-11 DIAGNOSIS — R0600 Dyspnea, unspecified: Secondary | ICD-10-CM | POA: Insufficient documentation

## 2020-07-11 DIAGNOSIS — R748 Abnormal levels of other serum enzymes: Secondary | ICD-10-CM | POA: Insufficient documentation

## 2020-07-11 LAB — BMP8+ANION GAP
Anion Gap: 17 mmol/L (ref 10.0–18.0)
BUN/Creatinine Ratio: 13 (ref 9–23)
BUN: 9 mg/dL (ref 6–24)
CO2: 27 mmol/L (ref 20–29)
Calcium: 10.3 mg/dL — ABNORMAL HIGH (ref 8.7–10.2)
Chloride: 99 mmol/L (ref 96–106)
Creatinine, Ser: 0.69 mg/dL (ref 0.57–1.00)
GFR calc Af Amer: 110 mL/min/{1.73_m2} (ref >59)
GFR calc non Af Amer: 96 mL/min/{1.73_m2} (ref >59)
Glucose: 149 mg/dL — ABNORMAL HIGH (ref 65–99)
Potassium: 4 mmol/L (ref 3.5–5.2)
Sodium: 143 mmol/L (ref 134–144)

## 2020-07-11 LAB — CK: Total CK: 244 U/L — ABNORMAL HIGH (ref 32–182)

## 2020-07-11 MED ORDER — IRBESARTAN 300 MG PO TABS: 300.0000 mg | ORAL_TABLET | Freq: Every day | ORAL | 0 refills | Status: DC

## 2020-07-11 NOTE — Assessment & Plan Note (Signed)
Patient endorses ongoing lumbar back pain radiating to bilateral lower extremities along the lateral legs.  This has been a chronic issue and patient is on chronic opiate therapy and is managed through the [facility] pain medicine clinic.  Plan Continue to monitor

## 2020-07-11 NOTE — Assessment & Plan Note (Signed)
Patient reported bilateral leg pain and muscle weakness, especially with standing from seated position. She notes being easily fatigued as well. She denies any shoulder pain. She is on high dose statin and could be at risk for rhabdomyolysis; however, she has been on this since September 2020. CK is elevated to 244; however, this is lower than prior with highest value ~400 in 2015. In absence of renal involvement or electrolyte imbalances, doubt that her elevated CK is secondary to rhabdomyolysis.  Differential also includes inflammatory myopathy; drug induced myopathy as she is on high dose statin therapy or endocrine myopathy; however, patient does not have a history of hypothyroidism and recent TSH from 02/2019 is wnl.   Plan: Drug holiday from atorvastatin  If no significant improvement, will need to further evaluate for inflammatory myopathies with ESR/CRP and thyroid function panel

## 2020-07-11 NOTE — Assessment & Plan Note (Signed)
Most recent HbA1c of 8.4. [name] is following with endocrinologist. Her regimen has recently been adjusted to Novolin 70/30 86U in AM and 89U in PM, metformin 1000mg  bid, ozempic 1mg  weekly and Farxiga 10mg  daily. She is tolerating this well.   Plan: Continue current regimen HbA1c in 3 months  F/u with endocrinologist recommendations

## 2020-07-11 NOTE — Assessment & Plan Note (Signed)
Patient endorses a few weeks of worsening dyspnea on exertion and bilateral lower extremity edema.  She endorses that previously she was able to walk up a flight of stairs without significant dyspnea however, she notes that she usually has to take some time to breathe following going up 1 flight of stairs.  She suspects that this might be secondary to deconditioning.  He also notes that she is worsening bilateral lower extremity edema that is most significant at the end of the day.  She works as a Network engineer and I suspect that this may be secondary to dependent edema.  Patient is also endorsing orthopnea with approximately 3 pillows.  Denies any paroxysmal nocturnal dyspnea.  Euvolemic on examination.  No significant bilateral lower extremity edema.  Plan We will evaluate further with echo given her risk factors with uncontrolled hypertension and diabetes

## 2020-07-11 NOTE — Progress Notes (Signed)
   CC: diabetes and htn f/u  HPI:  [name] is a 60 y.o. female with PMHx as listed below presenting for follow up of her hypertension and diabetes. She also is concerned regarding bilateral lower extremity edema for several weeks. She endorses orthopnea and easy fatigability. She denies any chest pain or palpitations. Please see problem based charting for complete assessment and plan.  Past Medical History:  Diagnosis Date  . Allergy   . Arthritis    back   . Asthma    AS CHILD  . Chronic back pain   . Chronic leg pain    due to back pain  . 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 Diabetes ([place])    with neuropathy  . GERD (gastroesophageal reflux disease)   . Hyperlipidemia   . Hypertension   . Intraductal papilloma of left breast 02/18/2016  . Neuromuscular disorder ([facility])    neuropathy  . Postlaminectomy syndrome of lumbar region 12/07/2011  . RECTAL BLEEDING 12/09/2008   Annotation: s/p EGD 7/08 mild gastritis, s/p colonoscopy 7/08- benign polyp  s/p polypectomy and isolated diverticulum.  Qualifier: Diagnosis of  By: Ditzler RN, Debra    . Sciatica    per patient   . Sleep apnea    CPAP    YEARS AGO DONE 1/2 YEARS AGO AND WAS TOLD DID NOT HAVE  . Tobacco abuse   . Uterine fibroid    s/p hysterectomy   Review of Systems:  Negative except as stated in HPI.  Physical Exam:  Vitals:   07/10/20 0914 07/10/20 1014  BP: (!) 163/76 (!) 147/77  Pulse: 95 86  Temp: 98.4 F (36.9 C)   TempSrc: Oral   SpO2: 98%   Weight: 252 lb 6.4 oz (114.5 kg)   Height: 5\' 4"  (1.626 m)    Physical Exam  Constitutional: Appears well-developed and well-nourished. No distress.  Cardiovascular: Normal rate, regular rhythm, S1 and S2 present, no murmurs, rubs, gallops.  Distal pulses intact Respiratory: No respiratory distress, no accessory muscle use.  Effort is normal.  Lungs are clear to auscultation bilaterally. Musculoskeletal: Normal bulk and tone.  Mild bilateral lower extremity,  nonpitting.  Neurological: Is alert and oriented x4, no apparent focal deficits noted. Skin: Warm and dry.  No rash, erythema, lesions noted.  Assessment & Plan:   See Encounters Tab for problem based charting.  Patient discussed with Dr. Jimmye Norman

## 2020-07-11 NOTE — Assessment & Plan Note (Signed)
BP Readings from Last 3 Encounters:  07/10/20 (!) 147/77  07/06/20 132/76  05/28/20 130/70   [name] has a history of hypertension for which she is on irbesartan and HCTZ daily. She endorses medication compliance. She does endorse some mild headaches but denies any chest pain, vision changes, headache or dizziness. She is tolerating medications well. No significant neurologic deficits noted on examination.  She does endorse worsening bilateral lower extremity pain for which she has been using NSAIDs. Suspect that this may be contributing to her worsening hypertension.  Plan: Increase irbesartan to 300mg  daily  Continue HCTZ 25mg  daily Advised against NSAID use

## 2020-07-15 NOTE — Progress Notes (Signed)
Internal Medicine Clinic Attending  Case discussed with Dr. Marva Panda  At the time of the visit.  We reviewed the resident's history and exam and pertinent patient test results.  I agree with the assessment, diagnosis, and plan of care documented in the resident's note. NSAID use could contribute to a possible heart failure etiology of her edema (as well as contribute to poorly controlled HTN).

## 2020-07-21 DIAGNOSIS — Z961 Presence of intraocular lens: Secondary | ICD-10-CM | POA: Diagnosis not present

## 2020-07-21 DIAGNOSIS — Z794 Long term (current) use of insulin: Secondary | ICD-10-CM | POA: Diagnosis not present

## 2020-07-21 DIAGNOSIS — H40031 Anatomical narrow angle, right eye: Secondary | ICD-10-CM | POA: Diagnosis not present

## 2020-07-21 DIAGNOSIS — E119 Type 2 diabetes mellitus without complications: Secondary | ICD-10-CM | POA: Diagnosis not present

## 2020-07-21 DIAGNOSIS — H0102A Squamous blepharitis right eye, upper and lower eyelids: Secondary | ICD-10-CM | POA: Diagnosis not present

## 2020-07-21 DIAGNOSIS — H0102B Squamous blepharitis left eye, upper and lower eyelids: Secondary | ICD-10-CM | POA: Diagnosis not present

## 2020-07-21 DIAGNOSIS — H40013 Open angle with borderline findings, low risk, bilateral: Secondary | ICD-10-CM | POA: Diagnosis not present

## 2020-07-21 DIAGNOSIS — H1045 Other chronic allergic conjunctivitis: Secondary | ICD-10-CM | POA: Diagnosis not present

## 2020-07-21 DIAGNOSIS — H04123 Dry eye syndrome of bilateral lacrimal glands: Secondary | ICD-10-CM | POA: Diagnosis not present

## 2020-07-21 LAB — HM DIABETES EYE EXAM

## 2020-07-23 ENCOUNTER — Ambulatory Visit: Payer: PPO | Admitting: Podiatry

## 2020-07-27 ENCOUNTER — Encounter: Payer: Self-pay | Admitting: *Deleted

## 2020-07-28 DIAGNOSIS — Z6841 Body Mass Index (BMI) 40.0 and over, adult: Secondary | ICD-10-CM | POA: Diagnosis not present

## 2020-07-28 DIAGNOSIS — M5441 Lumbago with sciatica, right side: Secondary | ICD-10-CM | POA: Diagnosis not present

## 2020-07-28 DIAGNOSIS — M25551 Pain in right hip: Secondary | ICD-10-CM | POA: Diagnosis not present

## 2020-07-28 DIAGNOSIS — I10 Essential (primary) hypertension: Secondary | ICD-10-CM | POA: Diagnosis not present

## 2020-07-31 DIAGNOSIS — M5136 Other intervertebral disc degeneration, lumbar region: Secondary | ICD-10-CM | POA: Diagnosis not present

## 2020-07-31 DIAGNOSIS — M545 Low back pain, unspecified: Secondary | ICD-10-CM | POA: Diagnosis not present

## 2020-07-31 DIAGNOSIS — Z6841 Body Mass Index (BMI) 40.0 and over, adult: Secondary | ICD-10-CM | POA: Diagnosis not present

## 2020-07-31 DIAGNOSIS — M542 Cervicalgia: Secondary | ICD-10-CM | POA: Diagnosis not present

## 2020-07-31 DIAGNOSIS — M546 Pain in thoracic spine: Secondary | ICD-10-CM | POA: Diagnosis not present

## 2020-07-31 DIAGNOSIS — Z79899 Other long term (current) drug therapy: Secondary | ICD-10-CM | POA: Diagnosis not present

## 2020-08-05 DIAGNOSIS — G4733 Obstructive sleep apnea (adult) (pediatric): Secondary | ICD-10-CM | POA: Diagnosis not present

## 2020-08-07 ENCOUNTER — Other Ambulatory Visit: Payer: Self-pay

## 2020-08-07 ENCOUNTER — Ambulatory Visit (INDEPENDENT_AMBULATORY_CARE_PROVIDER_SITE_OTHER): Payer: PPO | Admitting: Student

## 2020-08-07 VITALS — BP 130/63 | HR 86 | Wt 245.1 lb

## 2020-08-07 DIAGNOSIS — E1140 Type 2 diabetes mellitus with diabetic neuropathy, unspecified: Secondary | ICD-10-CM

## 2020-08-07 DIAGNOSIS — Z794 Long term (current) use of insulin: Secondary | ICD-10-CM

## 2020-08-07 DIAGNOSIS — R748 Abnormal levels of other serum enzymes: Secondary | ICD-10-CM

## 2020-08-07 DIAGNOSIS — I10 Essential (primary) hypertension: Secondary | ICD-10-CM

## 2020-08-07 DIAGNOSIS — G4733 Obstructive sleep apnea (adult) (pediatric): Secondary | ICD-10-CM | POA: Diagnosis not present

## 2020-08-07 NOTE — Assessment & Plan Note (Signed)
Assessment: Last A1c of 8.4. Patient tolerating medications well, novolin 70/30-86U in AM and 89U in PM, metformin 1000 mg BID, ozempic 1 mg weekly, and farxiga 10 mg daily. Patient's goal to eventually come off as many DM medications as possible. Encouraged patient to continue to work on diet/weight loss, congratulated her on progress made thus far.   Patient and her neurosurgeon discussing possible back surgery, patient does not know if they have A1c target before they will perform the procedure.   Plan: -continue novolin 70/30-86U in AM and 89U in PM, metformin 1000 mg BID, ozempic 1 mg weekly, and farxiga 10 mg daily. -A1c in 2 months -continue to f/u with endocrinology

## 2020-08-07 NOTE — Progress Notes (Signed)
CC: High blood pressure, diabetes, back/leg pain, shortness of breath  HPI:  [name] is a 60 y.o. female with a past medical history stated below and presents today for follow up of high blood pressure, diabetes, back/leg pain, shortness of breath Please see problem based assessment and plan for additional details.  Past Medical History:  Diagnosis Date  . Allergy   . Arthritis    back   . Asthma    AS CHILD  . Chronic back pain   . Chronic leg pain    due to back pain  . 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 GERD (gastroesophageal reflux disease)   . Hyperlipidemia   . Hypertension   . Intraductal papilloma of left breast 02/18/2016  . Neuromuscular disorder (H[facility])    neuropathy  . Postlaminectomy syndrome of lumbar region 12/07/2011  . RECTAL BLEEDING 12/09/2008   Annotation: s/p EGD 7/08 mild gastritis, s/p colonoscopy 7/08- benign polyp  s/p polypectomy and isolated diverticulum.  Qualifier: Diagnosis of  By: Ditzler RN, Debra    . Sciatica    per patient   . Sleep apnea    CPAP    YEARS AGO DONE 1/2 YEARS AGO AND WAS TOLD DID NOT HAVE  . Tobacco abuse   . Uterine fibroid    s/p hysterectomy    Current Outpatient Medications on File Prior to Visit  Medication Sig Dispense Refill  . acyclovir (ZOVIRAX) 400 MG tablet Take 1 tablet (400 mg total) by mouth 3 (three) times daily as needed (flares). 90 tablet 3  . albuterol (VENTOLIN HFA) 108 (90 Base) MCG/ACT inhaler Inhale 2 puffs into the lungs every 6 (six) hours as needed for wheezing or shortness of breath. 18 g 12  . aspirin 81 MG EC tablet Take 81 mg by mouth daily.    . Blood Glucose Monitoring Suppl (ONETOUCH VERIO FLEX SYSTEM) w/Device KIT 1 each by Does not apply route 3 (three) times daily. 1 kit 1  . budesonide-formoterol (SYMBICORT) 80-4.5 MCG/ACT inhaler Inhale 2 puffs then rinse mouth, twice daily- maintenance 10.2 g 12  . buprenorphine (BUTRANS) 5 MCG/HR PTWK 1  patch once a week.    . chlorpheniramine (CHLOR-TRIMETON) 4 MG tablet Take 4 mg by mouth every 4 (four) hours as needed for allergies.    . Continuous Blood Gluc Receiver (FREESTYLE LIBRE 2 READER SYSTM) DEVI 1 each by Does not apply route 4 (four) times daily. 1 each 0  . Continuous Blood Gluc Sensor (FREESTYLE LIBRE 14 DAY SENSOR) MISC USE TO CHECK BLOOD SUGARS 4 TIMES DAILY 2 each 0  . Continuous Glucose Monitor DEVI Use as directed. 1 each 0  . dapagliflozin propanediol (FARXIGA) 10 MG TABS tablet Take 1 tablet (10 mg total) by mouth daily. 30 tablet 6  . diclofenac Sodium (VOLTAREN) 1 % GEL Apply 2 g topically 4 (four) times daily. Rub into affected area of foot 2 to 4 times daily 100 g 2  . dicyclomine (BENTYL) 10 MG capsule Take 1 capsule (10 mg total) by mouth 3 (three) times daily before meals. 30 capsule 11  . esomeprazole (NEXIUM) 40 MG capsule Take 1 capsule (40 mg total) by mouth 2 (two) times daily before a meal. 60 capsule 5  . GARLIC 7017 PO Take by mouth.    . hydrochlorothiazide (HYDRODIURIL) 25 MG tablet Take 1 tablet by mouth once daily 90 tablet 1  . insulin aspart  protamine - aspart (NOVOLOG MIX 70/30 FLEXPEN) (70-30) 100 UNIT/ML FlexPen 86 units in AM; 88 units in PM 60 mL 11  . Insulin Pen Needle 32G X 4 MM MISC Use to inject insulin two times a day 200 each 3  . Insulin Syringe-Needle U-100 (INSULIN SYRINGE 1CC/30GX1/2") 30G X 1/2" 1 ML MISC Use to fill Vgo daily 100 each 5  . irbesartan (AVAPRO) 300 MG tablet Take 1 tablet (300 mg total) by mouth daily. 90 tablet 0  . metFORMIN (GLUCOPHAGE) 1000 MG tablet Take 1 tablet (1,000 mg total) by mouth 2 (two) times daily with a meal. 180 tablet 3  . methocarbamol (ROBAXIN) 500 MG tablet Take 1 tablet (500 mg total) by mouth every 8 (eight) hours as needed for muscle spasms. 30 tablet 0  . montelukast (SINGULAIR) 10 MG tablet Take 1 tablet (10 mg total) by mouth at bedtime. 30 tablet 11  . Multiple Vitamin (MULTIVITAMIN WITH  MINERALS) TABS tablet Take 1 tablet by mouth daily.    Marland Kitchen NARCAN 4 MG/0.1ML LIQD nasal spray kit USE 1 SPRAY IN THE NOSE AS NEEDED    . ONETOUCH DELICA LANCETS FINE MISC Check blood sugar 3 times a day 100 each 12  . ONETOUCH VERIO test strip  CHECK BLOOD SUGAR 3 TIMES A DAY 100 each 5  . oxyCODONE-acetaminophen (PERCOCET) 10-325 MG tablet Take 1 tablet by mouth 4 (four) times daily as needed.    Marland Kitchen OZEMPIC, 1 MG/DOSE, 4 MG/3ML SOPN INJECT 1 MG INTO THE SKIN ONCE A WEEK. 3 mL 0  . tiZANidine (ZANAFLEX) 4 MG capsule Take 4 mg by mouth 3 (three) times daily as needed.    . valACYclovir (VALTREX) 500 MG tablet TAKE 1 TABLET BY MOUTH TWICE DAILY FOR 5 DAYS AT  TIME  OF  OUTBREAK 10 tablet 0   Current Facility-Administered Medications on File Prior to Visit  Medication Dose Route Frequency Provider Last Rate Last Admin  . 0.9 %  sodium chloride infusion  500 mL Intravenous Once Ladene Artist, MD        Family History  Problem Relation Age of Onset  . Diabetes Father   . Heart disease Father   . Hypertension Father   . Diabetes Mother   . Cancer Mother        brain  . Hypertension Mother   . Kidney disease Sister   . Diabetes Sister   . Kidney cancer Sister   . Stroke Sister   . Diabetes Sister   . Hypertension Sister   . Diabetes Sister   . Hypertension Sister   . Obesity Son   . Colon cancer Neg Hx   . Colon polyps Neg Hx   . Esophageal cancer Neg Hx   . Rectal cancer Neg Hx   . Stomach cancer Neg Hx     Social History   Socioeconomic History  . Marital status: Married    Spouse name: Not on file  . Number of children: 3  . Years of education: Not on file  . Highest education level: Not on file  Occupational History  . 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    Employer: [employer]  Tobacco Use  . Smoking status: Former Smoker    Packs/day: 0.50    Years: 20.00    Pack years: 10.00    Types: Cigarettes    Quit date: 07/23/1996    Years since quitting: 24.0   . Smokeless tobacco: Never Used  Vaping Use  .  Vaping Use: Never used  Substance and Sexual Activity  . Alcohol use: No    Alcohol/week: 0.0 standard drinks    Comment: recovering addict clean for 9 years  . Drug use: No    Types: Cocaine, Marijuana    Comment: recovering addict clean for 9 years  . Sexual activity: Yes    Birth control/protection: Surgical  Other Topics Concern  . Not on file  Social History Narrative   Current Social History 01/17/2020        Patient lives with spouse in a home which is 1 story. There are not steps up to the entrance the patient uses.       Patient's method of transportation is personal car.      The highest level of education was some college.      The patient currently works part-time.      Identified important Relationships are God, husband, kids, grandkids       Pets : None       Interests / Fun: Drawing and nature       Current Stressors: Pain, Covid       Religious / Personal Beliefs: Christian       Other: None    Social Determinants of Health   Financial Resource Strain: Not on file  Food Insecurity: Not on file  Transportation Needs: Not on file  Physical Activity: Not on file  Stress: Not on file  Social Connections: Not on file  Intimate Partner Violence: Not on file    Review of Systems: ROS negative except for what is noted on the assessment and plan.  Vitals:   08/07/20 0918  BP: 130/63  Pulse: 86  SpO2: 99%  Weight: 245 lb 1.6 oz (111.2 kg)     Physical Exam: Constitutional: well-appearing, sitting in chair, in no acute distress HENT: normocephalic atraumatic Eyes: conjunctiva non-erythematous Neck: supple Cardiovascular: regular rate and rhythm, no m/r/g Pulmonary/Chest: normal work of breathing on room air MSK: normal bulk and tone, trace lower extremity edema bilaterally Neurological: alert & oriented x 3 Skin: warm and dry Psych: Normal mood   Assessment & Plan:   See Encounters Tab for  problem based charting.  Patient discussed with Dr. Laurena Slimmer, D.O. [facility], PGY-1 Pager: [phone], Phone: [phone] Date 08/07/2020 Time 9:22 AM

## 2020-08-07 NOTE — Patient Instructions (Addendum)
Thank you, [name] for allowing [place] to provide your care today. Today we discussed  High Blood Pressure - your blood pressure looks great today!   Leg Pain - We will be checking a few labs today to rule out that this pain isn't coming from your muscles  Diabetes - you're doing great! Keep up the good work, focusing on diet and exercise.   Leg Swelling - Please schedule the echocardiogram!     I have ordered the following labs for you:   Lab Orders     CK, total     LDH     Aldolase     MyoMarker 3 Plus Profile (RDL)   Referrals ordered today:   Referral Orders  No referral(s) requested today     I have ordered the following medication/changed the following medications:   Stop the following medications: There are no discontinued medications.   Start the following medications: No orders of the defined types were placed in this encounter.    Follow up: 2 months    Remember: Please return in 2 months! I will call you with lab results  Should you have any questions or concerns please call the internal medicine clinic at [phone].     Sanjuana Letters, D.O. [place]

## 2020-08-07 NOTE — Assessment & Plan Note (Signed)
BP Readings from Last 3 Encounters:  08/07/20 130/63  07/10/20 (!) 147/77  07/06/20 132/76   Assessment: Patient with Hx of essential HTN, currently tolerating irbesartan and HCTZ well. Increased irbesartan to 300 mg last month. Patient without symptoms of lightheadedness or dizziness since increase in medication. BP within goal.  Plan: -continue irbesartan 300 mg daily and HCTZ 25 mg daily

## 2020-08-07 NOTE — Assessment & Plan Note (Addendum)
Assessment: During patient's last visit she endorsed bilateral leg pain and muscle weakness. A drug holiday was started from atorvastatin, patient with elevated CK of 244. Past CK's in the past have been higher in the past and decreased after stopping statin therapy. Patient to continues to endorse lower extremity pain despite drug holiday. Upon further questioning, patient describes lower extremity pain as someone stabbing her leg, states certain positions exacerbate the pain. This does seem to be more consistent with neuropathic pain, however, in setting of persistenly elevated CK's, will start inflammatory myopathy workup.   Okay to continue to hold statin therapy at this time, patient started on it for primary prevention. ASCVD risk score of 15..2%  Plan: -Myositis panel ordered -LDH,Aldolase,CK ordered -continue to hold atorvastatin  Addendum: LDH, aldolase normal. CK persistent in 200's. Myositis panel negative. Called patient and updated her on her results. Patient has follow up appointment with PCP next month. Will have her continue to hold statin until that appointment and then she can repeat CK levels then and determine whether or not to continue statin or treat hyperlipidemia with alternative therapy.

## 2020-08-08 LAB — CK: Total CK: 299 U/L — ABNORMAL HIGH (ref 32–182)

## 2020-08-08 LAB — ALDOLASE: Aldolase: 5.8 U/L (ref 3.3–10.3)

## 2020-08-08 LAB — LACTATE DEHYDROGENASE: LDH: 120 IU/L (ref 119–226)

## 2020-08-10 NOTE — Progress Notes (Signed)
Internal Medicine Clinic Attending  Case discussed with Dr. Katsadouros  At the time of the visit.  We reviewed the resident's history and exam and pertinent patient test results.  I agree with the assessment, diagnosis, and plan of care documented in the resident's note.  

## 2020-08-11 ENCOUNTER — Other Ambulatory Visit: Payer: Self-pay

## 2020-08-11 ENCOUNTER — Ambulatory Visit: Payer: PPO | Admitting: Podiatry

## 2020-08-11 ENCOUNTER — Encounter: Payer: Self-pay | Admitting: Podiatry

## 2020-08-11 DIAGNOSIS — I10 Essential (primary) hypertension: Secondary | ICD-10-CM | POA: Diagnosis not present

## 2020-08-11 DIAGNOSIS — M5441 Lumbago with sciatica, right side: Secondary | ICD-10-CM | POA: Diagnosis not present

## 2020-08-11 DIAGNOSIS — E119 Type 2 diabetes mellitus without complications: Secondary | ICD-10-CM | POA: Diagnosis not present

## 2020-08-11 DIAGNOSIS — E1165 Type 2 diabetes mellitus with hyperglycemia: Secondary | ICD-10-CM | POA: Diagnosis not present

## 2020-08-12 NOTE — Progress Notes (Signed)
Subjective: 60 year old female presents the office today for diabetic foot exam.  She recently had a pedicure over the weekend and her nails do not need to be trimmed.  She has follow-up with a neurosurgeon recommended back surgery.  She does get sharp pains in her legs.  She is contemplating this currently.  Last A1c on July 06, 2020 was 8.3.  Objective: AAO x3, NAD DP/PT pulses palpable bilaterally, CRT less than 3 seconds No pain the nails there is no redness or drainage or signs of infection but there is no open lesions.  No open lesions or pre-ulcerative lesions.  No pain with calf compression, swelling, warmth, erythema  Assessment: 60 year old female with uncontrolled diabetes  Plan: -All treatment options discussed with the patient including all alternatives, risks, complications.  -Feet are doing well.  Discussed daily foot inspection. -Discussed daily foot inspection Return in about 3 months   Trula Slade DPM

## 2020-08-17 DIAGNOSIS — E1140 Type 2 diabetes mellitus with diabetic neuropathy, unspecified: Secondary | ICD-10-CM | POA: Diagnosis not present

## 2020-08-20 LAB — MYOMARKER 3 PLUS PROFILE (RDL)

## 2020-08-25 ENCOUNTER — Other Ambulatory Visit: Payer: Self-pay | Admitting: Neurosurgery

## 2020-08-26 ENCOUNTER — Telehealth: Payer: Self-pay | Admitting: Internal Medicine

## 2020-08-26 NOTE — Telephone Encounter (Signed)
Patient scheduled for appt on 09/01/2020 -pr

## 2020-08-28 DIAGNOSIS — E119 Type 2 diabetes mellitus without complications: Secondary | ICD-10-CM | POA: Diagnosis not present

## 2020-08-28 DIAGNOSIS — M545 Low back pain, unspecified: Secondary | ICD-10-CM | POA: Diagnosis not present

## 2020-08-28 DIAGNOSIS — Z79899 Other long term (current) drug therapy: Secondary | ICD-10-CM | POA: Diagnosis not present

## 2020-08-28 DIAGNOSIS — M5136 Other intervertebral disc degeneration, lumbar region: Secondary | ICD-10-CM | POA: Diagnosis not present

## 2020-08-28 DIAGNOSIS — Z6841 Body Mass Index (BMI) 40.0 and over, adult: Secondary | ICD-10-CM | POA: Diagnosis not present

## 2020-08-28 DIAGNOSIS — M542 Cervicalgia: Secondary | ICD-10-CM | POA: Diagnosis not present

## 2020-08-29 ENCOUNTER — Other Ambulatory Visit: Payer: Self-pay | Admitting: Internal Medicine

## 2020-08-29 DIAGNOSIS — E1140 Type 2 diabetes mellitus with diabetic neuropathy, unspecified: Secondary | ICD-10-CM

## 2020-09-01 ENCOUNTER — Ambulatory Visit: Payer: PPO | Admitting: Primary Care

## 2020-09-01 NOTE — Progress Notes (Deleted)
@Patient  ID: [name], female    DOB: December 29, 1960, 60 y.o.   MRN: [id number]  No chief complaint on file.   Referring provider: Harvie Heck, MD  HPI: 60 year old female, former smoker quit 1998 (10-pack-year history).  Past medical history significant for OSA on CPAP, COPD Gold 2, lung nodule, hypertension, GERD, type 2 diabetes, chronic pain syndrome, lumbar radiculopathy, hyperlipidemia, obesity.  Patient of Dr. Annamaria Boots, seen in office on 05/28/2020.  Maintained on CPAP auto 10 to 20 cm H2O  09/01/2020 Presents today for surgical clearance for spinal fusion.   Routine chest x-ray during last visit in December 2021 showed no acute process.  Both lungs were clear.  Symbicort 80 two puffs twice daily, as needed Ventolin 2 puffs every 6 hours Singulair 10 mg at bedtime    Allergies  Allergen Reactions  . Lisinopril Cough    Immunization History  Administered Date(s) Administered  . Influenza Split 03/11/2011, 03/02/2012  . Influenza Whole 02/12/2010  . Influenza,inj,Quad PF,6+ Mos 03/08/2013, 02/27/2014, 05/19/2015, 04/19/2016, 03/09/2017, 02/06/2018, 02/12/2019, 04/10/2020  . PFIZER(Purple Top)SARS-COV-2 Vaccination 07/25/2019, 08/19/2019, 03/21/2020  . PPD Test 10/04/2010, 07/11/2011, 12/05/2012  . Pneumococcal Polysaccharide-23 01/13/2012, 07/18/2017  . Tdap 11/18/2010    Past Medical History:  Diagnosis Date  . Allergy   . Arthritis    back   . Asthma    AS CHILD  . Chronic back pain   . Chronic leg pain    due to back pain  . Cocaine abuse ([place])    in remission  . Diabetes ([place])    with neuropathy  . GERD (gastroesophageal reflux disease)   . Hyperlipidemia   . Hypertension   . Intraductal papilloma of left breast 02/18/2016  . Neuromuscular disorder (H[facility])    neuropathy  . Postlaminectomy syndrome of lumbar region 12/07/2011  . RECTAL BLEEDING 12/09/2008   Annotation: s/p EGD 7/08 mild gastritis, s/p colonoscopy 7/08- benign polyp  s/p polypectomy and  isolated diverticulum.  Qualifier: Diagnosis of  By: Ditzler RN, Debra    . Sciatica    per patient   . Sleep apnea    CPAP    YEARS AGO DONE 1/2 YEARS AGO AND WAS TOLD DID NOT HAVE  . Tobacco abuse   . Uterine fibroid    s/p hysterectomy    Tobacco History: Social History   Tobacco Use  Smoking Status Former Smoker  . Packs/day: 0.50  . Years: 20.00  . Pack years: 10.00  . Types: Cigarettes  . Quit date: 07/23/1996  . Years since quitting: 24.1  Smokeless Tobacco Never Used   Counseling given: Not Answered   Outpatient Medications Prior to Visit  Medication Sig Dispense Refill  . acyclovir (ZOVIRAX) 400 MG tablet Take 1 tablet (400 mg total) by mouth 3 (three) times daily as needed (flares). 90 tablet 3  . albuterol (VENTOLIN HFA) 108 (90 Base) MCG/ACT inhaler Inhale 2 puffs into the lungs every 6 (six) hours as needed for wheezing or shortness of breath. 18 g 12  . aspirin 81 MG EC tablet Take 81 mg by mouth daily.    . Blood Glucose Monitoring Suppl (ONETOUCH VERIO FLEX SYSTEM) w/Device KIT 1 each by Does not apply route 3 (three) times daily. 1 kit 1  . budesonide-formoterol (SYMBICORT) 80-4.5 MCG/ACT inhaler Inhale 2 puffs then rinse mouth, twice daily- maintenance 10.2 g 12  . buprenorphine (BUTRANS) 5 MCG/HR PTWK 1 patch once a week.    . chlorpheniramine (CHLOR-TRIMETON) 4 MG tablet Take 4 mg  by mouth every 4 (four) hours as needed for allergies.    . Continuous Blood Gluc Receiver (FREESTYLE LIBRE 2 READER SYSTM) DEVI 1 each by Does not apply route 4 (four) times daily. 1 each 0  . Continuous Blood Gluc Sensor (FREESTYLE LIBRE 14 DAY SENSOR) MISC USE TO CHECK BLOOD SUGARS 4 TIMES DAILY 2 each 0  . Continuous Glucose Monitor DEVI Use as directed. 1 each 0  . dapagliflozin propanediol (FARXIGA) 10 MG TABS tablet Take 1 tablet (10 mg total) by mouth daily. 30 tablet 6  . diclofenac Sodium (VOLTAREN) 1 % GEL Apply 2 g topically 4 (four) times daily. Rub into affected area  of foot 2 to 4 times daily 100 g 2  . dicyclomine (BENTYL) 10 MG capsule Take 1 capsule (10 mg total) by mouth 3 (three) times daily before meals. 30 capsule 11  . esomeprazole (NEXIUM) 40 MG capsule Take 1 capsule (40 mg total) by mouth 2 (two) times daily before a meal. 60 capsule 5  . GARLIC 3086 PO Take by mouth.    . hydrochlorothiazide (HYDRODIURIL) 25 MG tablet Take 1 tablet by mouth once daily 90 tablet 1  . insulin aspart protamine - aspart (NOVOLOG MIX 70/30 FLEXPEN) (70-30) 100 UNIT/ML FlexPen 86 units in AM; 88 units in PM 60 mL 11  . Insulin Pen Needle 32G X 4 MM MISC Use to inject insulin two times a day 200 each 3  . Insulin Syringe-Needle U-100 (INSULIN SYRINGE 1CC/30GX1/2") 30G X 1/2" 1 ML MISC Use to fill Vgo daily 100 each 5  . irbesartan (AVAPRO) 300 MG tablet Take 1 tablet (300 mg total) by mouth daily. 90 tablet 0  . metFORMIN (GLUCOPHAGE) 1000 MG tablet Take 1 tablet (1,000 mg total) by mouth 2 (two) times daily with a meal. 180 tablet 3  . methocarbamol (ROBAXIN) 500 MG tablet Take 1 tablet (500 mg total) by mouth every 8 (eight) hours as needed for muscle spasms. 30 tablet 0  . montelukast (SINGULAIR) 10 MG tablet Take 1 tablet (10 mg total) by mouth at bedtime. 30 tablet 11  . Multiple Vitamin (MULTIVITAMIN WITH MINERALS) TABS tablet Take 1 tablet by mouth daily.    Marland Kitchen NARCAN 4 MG/0.1ML LIQD nasal spray kit USE 1 SPRAY IN THE NOSE AS NEEDED    . ONETOUCH DELICA LANCETS FINE MISC Check blood sugar 3 times a day 100 each 12  . ONETOUCH VERIO test strip  CHECK BLOOD SUGAR 3 TIMES A DAY 100 each 5  . oxyCODONE-acetaminophen (PERCOCET) 10-325 MG tablet Take 1 tablet by mouth 4 (four) times daily as needed.    Marland Kitchen OZEMPIC, 1 MG/DOSE, 4 MG/3ML SOPN INJECT 1 MG INTO THE SKIN ONCE A WEEK 9 mL 2  . tiZANidine (ZANAFLEX) 4 MG capsule Take 4 mg by mouth 3 (three) times daily as needed.    . valACYclovir (VALTREX) 500 MG tablet TAKE 1 TABLET BY MOUTH TWICE DAILY FOR 5 DAYS AT  TIME  OF   OUTBREAK 10 tablet 0   Facility-Administered Medications Prior to Visit  Medication Dose Route Frequency Provider Last Rate Last Admin  . 0.9 %  sodium chloride infusion  500 mL Intravenous Once Ladene Artist, MD          Review of Systems  Review of Systems   Physical Exam  There were no vitals taken for this visit. Physical Exam   Lab Results:  CBC    Component Value Date/Time   WBC 5.6  12/18/2019 1206   RBC 5.23 (H) 12/18/2019 1206   HGB 13.5 12/18/2019 1206   HCT 42.0 12/18/2019 1206   PLT 254.0 12/18/2019 1206   MCV 80.4 12/18/2019 1206   MCH 24.5 (L) 02/10/2016 0956   MCHC 32.2 12/18/2019 1206   RDW 14.9 12/18/2019 1206   LYMPHSABS 2.5 12/18/2019 1206   MONOABS 0.3 12/18/2019 1206   EOSABS 0.1 12/18/2019 1206   BASOSABS 0.1 12/18/2019 1206    BMET    Component Value Date/Time   NA 143 07/10/2020 1021   K 4.0 07/10/2020 1021   CL 99 07/10/2020 1021   CO2 27 07/10/2020 1021   GLUCOSE 149 (H) 07/10/2020 1021   GLUCOSE 236 (H) 07/06/2020 0904   BUN 9 07/10/2020 1021   CREATININE 0.69 07/10/2020 1021   CREATININE 0.67 02/28/2019 1303   CALCIUM 10.3 (H) 07/10/2020 1021   GFRNONAA 96 07/10/2020 1021   GFRNONAA 97 02/28/2019 1303   GFRAA 110 07/10/2020 1021   GFRAA 112 02/28/2019 1303    BNP No results found for: BNP  ProBNP No results found for: PROBNP  Imaging: No results found.   Assessment & Plan:   No problem-specific Assessment & Plan notes found for this encounter.     1) RISK FOR PROLONGED MECHANICAL VENTILAION - > 48h  1A) Arozullah - Prolonged mech ventilation risk Arozullah Postperative Pulmonary Risk Score - for mech ventilation dependence >48h [employer], Ann Surg 2000, major non-cardiac surgery) Comment Score  Type of surgery - abd ao aneurysm (27), thoracic (21), neurosurgery / upper abdominal / vascular (21), neck (11) *** ***  Emergency Surgery - (11)    ALbumin < 3 or poor nutritional state - (9)    BUN > 30 -   (8)    Partial or completely dependent functional status - (7)    COPD -  (6)    Age - 60 to 98 (4), > 70  (6)    TOTAL    Risk Stratifcation scores  - < 10 (0.5%), 11-19 (1.8%), 20-27 (4.2%), 28-40 (10.1%), >40 (26.6%)        1B) GUPTA - Prolonged Mech Vent Risk Score source Risk  Guptal post op prolonged mech ventilation > 48h or reintubation < 30 days - ACS 2007-2008 dataset - http://lewis-perez.info/ ***    2) RISK FOR POST OP PNEUMONIA Score source Risk  Lyndel Safe - Post Op Pnemounia risk  [employer].co.za ***    R3) ISK FOR ANY POST-OP PULMONARY COMPLICATION Score source Risk  CANET/ARISCAT Score - risk for ANY/ALl pulmonary complications - > risk of in-hospital post-op pulmonary complications (composite including respiratory failure, respiratory infection, pleural effusion, atelectasis, pneumothorax, bronchospasm, aspiration pneumonitis) [employer] - based on age, anemia, pulse ox, resp infection prior 30d, incision site, duration of surgery, and emergency v elective surgery ***      Martyn Ehrich, NP 09/01/2020

## 2020-09-02 DIAGNOSIS — G4733 Obstructive sleep apnea (adult) (pediatric): Secondary | ICD-10-CM | POA: Diagnosis not present

## 2020-09-03 NOTE — Progress Notes (Signed)
@Patient  ID: [name], female    DOB: Oct 28, 1960, 60 y.o.   MRN: [id number]  Chief Complaint  Patient presents with  . Follow-up    Surgical clarence for back surgery in April    Referring provider: Harvie Heck, MD  HPI: 60 year old female, former smoker quit in 1998. PMH significant for OSA, COPD mixed type, GERD, DM2, high blood pressure, lung nodule. Patient of Dr. Annamaria Boots, last seen on 05/28/20. Maintained on auto CPAP 10-20cm h20.    09/04/2020 Patient presents today for surgical clearance/risk assessment. She is scheduled for upcoming lumbar fusion in April with Dr. Christella Noa. She will be admitted after surgery for  She is doing well today. She has no acute complaints other than back pain. She has had no recent respiratory infections, COPD exacerbations or hospitalizations. She uses Symbicort 75mg inhaler twice a day as presribed. She has not needed to use Ventolin rescue inhaler in the last several months. She had a CXR in December 2021 that was normal. Pulmonary function testing in 2019 showed moderate obstructive lung disease. She is not on supplemental oxygen. She is compliant with CPAP use 100% of the time. Pressure is 10-20cm h20 (15cm h20- 95% average). No issues with pressure setting or mask fit.   Airview download 08/05/20-09/03/20 30/30 days (100%); 27 days (80%) > 4 hours Average usage 7 hours 41 mins Pressure 10-20cm h20 (15.3cm h20- 95%) Airleak 15.3L/mins (95%) AHI 0.4  Imaging: Normal CXR 05/28/20  Pulmonary testing: Spriometry 11/16/17- FVC 1.9 (70%), FEV1 1.3 (62%), ratio 69  Allergies  Allergen Reactions  . Lisinopril Cough    Immunization History  Administered Date(s) Administered  . Influenza Split 03/11/2011, 03/02/2012  . Influenza Whole 02/12/2010  . Influenza,inj,Quad PF,6+ Mos 03/08/2013, 02/27/2014, 05/19/2015, 04/19/2016, 03/09/2017, 02/06/2018, 02/12/2019, 04/10/2020  . PFIZER(Purple Top)SARS-COV-2 Vaccination 07/25/2019, 08/19/2019, 03/21/2020   . PPD Test 10/04/2010, 07/11/2011, 12/05/2012  . Pneumococcal Polysaccharide-23 01/13/2012, 07/18/2017  . Tdap 11/18/2010    Past Medical History:  Diagnosis Date  . Allergy   . Arthritis    back   . Asthma    AS CHILD  . Chronic back pain   . Chronic leg pain    due to back pain  . Cocaine abuse [place]    in [place]  . [place]    with neuropathy  . GERD (gastroesophageal reflux disease)   . Hyperlipidemia   . Hypertension   . Intraductal papilloma of left breast 02/18/2016  . Neuromuscular disorder ([facility])    neuropathy  . Postlaminectomy syndrome of lumbar region 12/07/2011  . RECTAL BLEEDING 12/09/2008   Annotation: s/p EGD 7/08 mild gastritis, s/p colonoscopy 7/08- benign polyp  s/p polypectomy and isolated diverticulum.  Qualifier: Diagnosis of  By: Ditzler RN, Debra    . Sciatica    per patient   . Sleep apnea    CPAP    YEARS AGO DONE 1/2 YEARS AGO AND WAS TOLD DID NOT HAVE  . Tobacco abuse   . Uterine fibroid    s/p hysterectomy    Tobacco History: Social History   Tobacco Use  Smoking Status Former Smoker  . Packs/day: 0.50  . Years: 20.00  . Pack years: 10.00  . Types: Cigarettes  . Quit date: 07/23/1996  . Years since quitting: 24.1  Smokeless Tobacco Never Used   Counseling given: Not Answered   Outpatient Medications Prior to Visit  Medication Sig Dispense Refill  . acyclovir (ZOVIRAX) 400 MG tablet Take 1 tablet (400 mg total)  by mouth 3 (three) times daily as needed (flares). 90 tablet 3  . albuterol (VENTOLIN HFA) 108 (90 Base) MCG/ACT inhaler Inhale 2 puffs into the lungs every 6 (six) hours as needed for wheezing or shortness of breath. 18 g 12  . aspirin 81 MG EC tablet Take 81 mg by mouth daily.    . Blood Glucose Monitoring Suppl (ONETOUCH VERIO FLEX SYSTEM) w/Device KIT 1 each by Does not apply route 3 (three) times daily. 1 kit 1  . budesonide-formoterol (SYMBICORT) 80-4.5 MCG/ACT inhaler Inhale 2 puffs then rinse mouth, twice  daily- maintenance 10.2 g 12  . buprenorphine (BUTRANS) 5 MCG/HR PTWK 1 patch once a week.    . chlorpheniramine (CHLOR-TRIMETON) 4 MG tablet Take 4 mg by mouth every 4 (four) hours as needed for allergies.    . Continuous Blood Gluc Receiver (FREESTYLE LIBRE 2 READER SYSTM) DEVI 1 each by Does not apply route 4 (four) times daily. 1 each 0  . Continuous Blood Gluc Sensor (FREESTYLE LIBRE 14 DAY SENSOR) MISC USE TO CHECK BLOOD SUGARS 4 TIMES DAILY 2 each 0  . Continuous Glucose Monitor DEVI Use as directed. 1 each 0  . dapagliflozin propanediol (FARXIGA) 10 MG TABS tablet Take 1 tablet (10 mg total) by mouth daily. 30 tablet 6  . diclofenac Sodium (VOLTAREN) 1 % GEL Apply 2 g topically 4 (four) times daily. Rub into affected area of foot 2 to 4 times daily 100 g 2  . dicyclomine (BENTYL) 10 MG capsule Take 1 capsule (10 mg total) by mouth 3 (three) times daily before meals. 30 capsule 11  . esomeprazole (NEXIUM) 40 MG capsule Take 1 capsule (40 mg total) by mouth 2 (two) times daily before a meal. 60 capsule 5  . GARLIC 1610 PO Take by mouth.    . hydrochlorothiazide (HYDRODIURIL) 25 MG tablet Take 1 tablet by mouth once daily 90 tablet 1  . insulin aspart protamine - aspart (NOVOLOG MIX 70/30 FLEXPEN) (70-30) 100 UNIT/ML FlexPen 86 units in AM; 88 units in PM 60 mL 11  . Insulin Pen Needle 32G X 4 MM MISC Use to inject insulin two times a day 200 each 3  . Insulin Syringe-Needle U-100 (INSULIN SYRINGE 1CC/30GX1/2") 30G X 1/2" 1 ML MISC Use to fill Vgo daily 100 each 5  . irbesartan (AVAPRO) 300 MG tablet Take 1 tablet (300 mg total) by mouth daily. 90 tablet 0  . metFORMIN (GLUCOPHAGE) 1000 MG tablet Take 1 tablet (1,000 mg total) by mouth 2 (two) times daily with a meal. 180 tablet 3  . methocarbamol (ROBAXIN) 500 MG tablet Take 1 tablet (500 mg total) by mouth every 8 (eight) hours as needed for muscle spasms. 30 tablet 0  . montelukast (SINGULAIR) 10 MG tablet Take 1 tablet (10 mg total) by  mouth at bedtime. 30 tablet 11  . Multiple Vitamin (MULTIVITAMIN WITH MINERALS) TABS tablet Take 1 tablet by mouth daily.    Marland Kitchen NARCAN 4 MG/0.1ML LIQD nasal spray kit USE 1 SPRAY IN THE NOSE AS NEEDED    . ONETOUCH DELICA LANCETS FINE MISC Check blood sugar 3 times a day 100 each 12  . ONETOUCH VERIO test strip  CHECK BLOOD SUGAR 3 TIMES A DAY 100 each 5  . oxyCODONE-acetaminophen (PERCOCET) 10-325 MG tablet Take 1 tablet by mouth 4 (four) times daily as needed.    Marland Kitchen OZEMPIC, 1 MG/DOSE, 4 MG/3ML SOPN INJECT 1 MG INTO THE SKIN ONCE A WEEK 9 mL 2  .  tiZANidine (ZANAFLEX) 4 MG capsule Take 4 mg by mouth 3 (three) times daily as needed.    . valACYclovir (VALTREX) 500 MG tablet TAKE 1 TABLET BY MOUTH TWICE DAILY FOR 5 DAYS AT  TIME  OF  OUTBREAK 10 tablet 0   Facility-Administered Medications Prior to Visit  Medication Dose Route Frequency Provider Last Rate Last Admin  . 0.9 %  sodium chloride infusion  500 mL Intravenous Once Ladene Artist, MD       Review of Systems  Review of Systems  Constitutional: Negative.   HENT: Negative.   Respiratory: Negative.   Cardiovascular: Negative.    Physical Exam  BP 134/78 (BP Location: Left Arm, Cuff Size: Large)   Pulse 90   Temp (!) 97.3 F (36.3 C) (Temporal)   Ht 5' 4"  (1.626 m)   Wt 247 lb (112 kg)   SpO2 100%   BMI 42.40 kg/m  Physical Exam Constitutional:      Appearance: Normal appearance.  HENT:     Head: Normocephalic and atraumatic.     Mouth/Throat:     Mouth: Mucous membranes are moist.     Pharynx: Oropharynx is clear.  Cardiovascular:     Rate and Rhythm: Normal rate and regular rhythm.  Pulmonary:     Effort: Pulmonary effort is normal.     Breath sounds: Normal breath sounds.  Neurological:     General: No focal deficit present.     Mental Status: She is alert and oriented to person, place, and time. Mental status is at baseline.  Psychiatric:        Mood and Affect: Mood normal.        Behavior: Behavior  normal.        Thought Content: Thought content normal.        Judgment: Judgment normal.      Lab Results:  CBC    Component Value Date/Time   WBC 5.6 12/18/2019 1206   RBC 5.23 (H) 12/18/2019 1206   HGB 13.5 12/18/2019 1206   HCT 42.0 12/18/2019 1206   PLT 254.0 12/18/2019 1206   MCV 80.4 12/18/2019 1206   MCH 24.5 (L) 02/10/2016 0956   MCHC 32.2 12/18/2019 1206   RDW 14.9 12/18/2019 1206   LYMPHSABS 2.5 12/18/2019 1206   MONOABS 0.3 12/18/2019 1206   EOSABS 0.1 12/18/2019 1206   BASOSABS 0.1 12/18/2019 1206    BMET    Component Value Date/Time   NA 143 07/10/2020 1021   K 4.0 07/10/2020 1021   CL 99 07/10/2020 1021   CO2 27 07/10/2020 1021   GLUCOSE 149 (H) 07/10/2020 1021   GLUCOSE 236 (H) 07/06/2020 0904   BUN 9 07/10/2020 1021   CREATININE 0.69 07/10/2020 1021   CREATININE 0.67 02/28/2019 1303   CALCIUM 10.3 (H) 07/10/2020 1021   GFRNONAA 96 07/10/2020 1021   GFRNONAA 97 02/28/2019 1303   GFRAA 110 07/10/2020 1021   GFRAA 112 02/28/2019 1303    BNP No results found for: BNP  ProBNP No results found for: PROBNP  Imaging: No results found.   Assessment & Plan:   COPD GOLD II  Former smoker, quit 2000. Spirometry 2019 showed moderate airflow obstruction with FEV1 1.33 (62%), ratio 69 - 09/04/2020 Stable interval, breathing well controlled on Symbicort 76mg 2puffs BID. No recent exacerbations. Rare SABA use. No changes today, fu in 6 months with Dr. YAnnamaria Boots OSA on CPAP NPSG 08/11/17- Moderate OSA, AHI 17.7/hr with SpO2 low 85% - 09/04/2020 Patient  is 100% compliant with CPAP use. Pressure 10-20cm h20; residual AHI 0.4. No changes.   Pre-operative respiratory examination - Patient has history of moderate OSA and moderate COPD. She is currently well managed on CPAP therapy and low dose ICS/LABA. Respiratory exam was benign today. She is an intermediate risk for prolonged mech ventilation and post-op pulmonary complications. Reviewed these risks with  patient and post op recommendations.  Ultimate surgical clearance will be decided between patient's surgeon and anesthesiologist.      Major Pulmonary risks identified in the multifactorial risk analysis are but not limited to a) pneumonia; b) recurrent intubation risk; c) prolonged or recurrent acute respiratory failure needing mechanical ventilation; d) prolonged hospitalization; e) DVT/Pulmonary embolism; f) Acute Pulmonary edema  Recommend 1. Short duration of surgery as much as possible and avoid paralytic if possible 2. Recovery in step down or ICU with Pulmonary consultation if needed  3. DVT prophylaxis 4. Aggressive pulmonary toilet with o2, bronchodilatation, and incentive spirometry and early ambulation    1) RISK FOR PROLONGED MECHANICAL VENTILAION - > 48h  1A) Arozullah - Prolonged mech ventilation risk Arozullah Postperative Pulmonary Risk Score - for mech ventilation dependence >48h [employer], Ann Surg 2000, major non-cardiac surgery) Comment Score  Type of surgery - abd ao aneurysm (27), thoracic (21), neurosurgery / upper abdominal / vascular (21), neck (11) Lumbar fusion  21  Emergency Surgery - (11)  0  ALbumin < 3 or poor nutritional state - (9)  0  BUN > 30 -  (8)  0  Partial or completely dependent functional status - (7)  0  COPD -  (6)  6  Age - 60 to 69 (4), > 70  (6)  4  TOTAL    Risk Stratifcation scores  - < 10 (0.5%), 11-19 (1.8%), 20-27 (4.2%), 28-40 (10.1%), >40 (26.6%)  31      1B) GUPTA - Prolonged Mech Vent Risk Score source Risk  Guptal post op prolonged mech ventilation > 48h or reintubation < 30 days - ACS 2007-2008 dataset - http://lewis-perez.info/ 0.4 % Risk of mechanical ventilation for >48 hrs after surgery, or unplanned intubation ?30 days of surgery    2) RISK FOR POST OP PNEUMONIA Score source Risk  Lyndel Safe - Post Op Pnemounia risk   [email or web address] 0.4 % Risk of postoperative pneumonia    R3) ISK FOR ANY POST-OP PULMONARY COMPLICATION Score source Risk  CANET/ARISCAT Score - risk for ANY/ALl pulmonary complications - > risk of in-hospital post-op pulmonary complications (composite including respiratory failure, respiratory infection, pleural effusion, atelectasis, pneumothorax, bronchospasm, aspiration pneumonitis) [employer] - based on age, anemia, pulse ox, resp infection prior 30d, incision site, duration of surgery, and emergency v elective surgery Intermediate risk 13.3% risk of in-hospital post-op pulmonary complications (composite including respiratory failure, respiratory infection, pleural effusion, atelectasis, pneumothorax, bronchospasm, aspiration pneumonitis)     Martyn Ehrich, NP 09/04/2020

## 2020-09-04 ENCOUNTER — Encounter: Payer: Self-pay | Admitting: Primary Care

## 2020-09-04 ENCOUNTER — Other Ambulatory Visit: Payer: Self-pay

## 2020-09-04 ENCOUNTER — Ambulatory Visit: Payer: PPO | Admitting: Primary Care

## 2020-09-04 DIAGNOSIS — G4733 Obstructive sleep apnea (adult) (pediatric): Secondary | ICD-10-CM

## 2020-09-04 DIAGNOSIS — Z9989 Dependence on other enabling machines and devices: Secondary | ICD-10-CM

## 2020-09-04 DIAGNOSIS — Z01811 Encounter for preprocedural respiratory examination: Secondary | ICD-10-CM | POA: Insufficient documentation

## 2020-09-04 DIAGNOSIS — J449 Chronic obstructive pulmonary disease, unspecified: Secondary | ICD-10-CM | POA: Diagnosis not present

## 2020-09-04 DIAGNOSIS — Z0001 Encounter for general adult medical examination with abnormal findings: Secondary | ICD-10-CM | POA: Insufficient documentation

## 2020-09-04 NOTE — Patient Instructions (Signed)
Pleasure meeting you today [name] are considered an intermediate risk for in-hospital post-op pulmonary complications. This is mostly due to your history of COPD and the type of procedure you are having.   Encourage early ambulation,  DVT prophylaxis and incentive spirometry   Continue to wear CPAP every night. No changed to setting today  If you develop an upper respiratory infection prior to surgery let us or surgical team know prior   Bring CPAP mask with you day of surgery   Follow-up in 6 months with Dr. Annamaria Boots

## 2020-09-04 NOTE — Assessment & Plan Note (Addendum)
-   Patient has history of moderate OSA and moderate COPD. She is currently well managed on CPAP therapy and low dose ICS/LABA. Respiratory exam was benign today. She is an intermediate risk for prolonged mech ventilation and post-op pulmonary complications. Reviewed these risks with patient and post op recommendations.  Ultimate surgical clearance will be decided between patient's surgeon and anesthesiologist.      Major Pulmonary risks identified in the multifactorial risk analysis are but not limited to a) pneumonia; b) recurrent intubation risk; c) prolonged or recurrent acute respiratory failure needing mechanical ventilation; d) prolonged hospitalization; e) DVT/Pulmonary embolism; f) Acute Pulmonary edema  Recommend 1. Short duration of surgery as much as possible and avoid paralytic if possible 2. Recovery in step down or ICU with Pulmonary consultation if needed  3. DVT prophylaxis 4. Aggressive pulmonary toilet with o2, bronchodilatation, and incentive spirometry and early ambulation

## 2020-09-04 NOTE — Assessment & Plan Note (Signed)
NPSG 08/11/17- Moderate OSA, AHI 17.7/hr with SpO2 low 85% - 09/04/2020 Patient is 100% compliant with CPAP use. Pressure 10-20cm h20; residual AHI 0.4. No changes.

## 2020-09-04 NOTE — Assessment & Plan Note (Signed)
Former smoker, quit 2000. Spirometry 2019 showed moderate airflow obstruction with FEV1 1.33 (62%), ratio 69 - 09/04/2020 Stable interval, breathing well controlled on Symbicort 73mcg 2puffs BID. No recent exacerbations. Rare SABA use. No changes today, fu in 6 months with Dr. Annamaria Boots

## 2020-09-08 DIAGNOSIS — G4733 Obstructive sleep apnea (adult) (pediatric): Secondary | ICD-10-CM | POA: Diagnosis not present

## 2020-09-15 ENCOUNTER — Encounter: Payer: Self-pay | Admitting: Internal Medicine

## 2020-09-15 ENCOUNTER — Ambulatory Visit (INDEPENDENT_AMBULATORY_CARE_PROVIDER_SITE_OTHER): Payer: PPO | Admitting: Internal Medicine

## 2020-09-15 ENCOUNTER — Other Ambulatory Visit: Payer: Self-pay

## 2020-09-15 VITALS — BP 111/55 | HR 87 | Temp 99.0°F | Ht 64.0 in | Wt 244.2 lb

## 2020-09-15 DIAGNOSIS — M5416 Radiculopathy, lumbar region: Secondary | ICD-10-CM | POA: Diagnosis not present

## 2020-09-15 DIAGNOSIS — I10 Essential (primary) hypertension: Secondary | ICD-10-CM

## 2020-09-15 DIAGNOSIS — R748 Abnormal levels of other serum enzymes: Secondary | ICD-10-CM

## 2020-09-15 DIAGNOSIS — Z794 Long term (current) use of insulin: Secondary | ICD-10-CM

## 2020-09-15 DIAGNOSIS — E785 Hyperlipidemia, unspecified: Secondary | ICD-10-CM

## 2020-09-15 DIAGNOSIS — E1140 Type 2 diabetes mellitus with diabetic neuropathy, unspecified: Secondary | ICD-10-CM | POA: Diagnosis not present

## 2020-09-15 LAB — POCT GLYCOSYLATED HEMOGLOBIN (HGB A1C): Hemoglobin A1C: 8.4 % — AB (ref 4.0–5.6)

## 2020-09-15 LAB — GLUCOSE, CAPILLARY: Glucose-Capillary: 219 mg/dL — ABNORMAL HIGH (ref 70–99)

## 2020-09-15 NOTE — Progress Notes (Signed)
   CC: diabetes f/u  HPI:  [name] is a 60 y.o. female with PMhx as listed below presenting for diabetes follow up. She is currently on Novolog 70/30 mix 86U in AM and 89U in PM with [place] 10mg  daily and Ozempic 1mg  weekly. She follows with endocrinologist. She does not have any acute concerns at this visit. She has a history of chronic back pain that has been getting worse and is currently using cane for ambulation. She is scheduled for L4-5, L5-S1 fusion on 4/22. Please see problem based charting for complete assessment and plan.   Please see problem based charting for complete assessment and plan.  Past Medical History:  Diagnosis Date  . Allergy   . Arthritis    back   . Asthma    AS CHILD  . Chronic back pain   . Chronic leg pain    due to back pain  . Cocaine abuse ([facility])    in remission  . Diabetes ([facility])    with neuropathy  . GERD (gastroesophageal reflux disease)   . Hyperlipidemia   . Hypertension   . Intraductal papilloma of left breast 02/18/2016  . Neuromuscular disorder ([facility])    neuropathy  . Postlaminectomy syndrome of lumbar region 12/07/2011  . RECTAL BLEEDING 12/09/2008   Annotation: s/p EGD 7/08 mild gastritis, s/p colonoscopy 7/08- benign polyp  s/p polypectomy and isolated diverticulum.  Qualifier: Diagnosis of  By: Ditzler RN, Debra    . Sciatica    per patient   . Sleep apnea    CPAP    YEARS AGO DONE 1/2 YEARS AGO AND WAS TOLD DID NOT HAVE  . Tobacco abuse   . Uterine fibroid    s/p hysterectomy   Review of Systems:  Negative except as stated in HPI.  Physical Exam:  Vitals:   09/15/20 1406  BP: (!) 111/55  Pulse: 87  Temp: 99 F (37.2 C)  TempSrc: Oral  SpO2: 97%  Weight: 244 lb 3.2 oz (110.8 kg)  Height: 5\' 4"  (1.626 m)   Physical Exam  Constitutional: Appears well-developed and well-nourished. No distress.  HENT: Normocephalic and atraumatic Cardiovascular: Normal rate, regular rhythm, S1 and S2 present, no murmurs, rubs, gallops.   Distal pulses intact Respiratory: No respiratory distress, Lungs are clear to auscultation bilaterally. Musculoskeletal: Normal bulk and tone.  No peripheral edema noted. No calf tenderness  Neurological: Is alert and oriented x4 Skin: Warm and dry.  No rash, erythema, lesions noted.   Assessment & Plan:   See Encounters Tab for problem based charting.  Patient discussed with Dr. Dareen Piano

## 2020-09-15 NOTE — Patient Instructions (Addendum)
[name],  It was a pleasure seeing you in clinic. Today we discussed:   Diabetes: Your A1c is 8.4 today! At this time, I would recommend to increase your morning Novolog 70/30 mix to 89 Units and continue 89 Units in the evening. Continue with Iran and Ozempic as prescribed.   Back pain/ hip pain: Follow up with your orthopedic surgeon for your upcoming surgery. On recent X-rays, it looks like you have arthritis of your hips and knees. You may try to use voltaren gel for pain at the hips.   Cholesterol: I am checking your cholesterol today and will follow up with any abnormal lab results.   If you have any questions or concerns, please call our clinic at [phone] between 9am-5pm and after hours call [phone] and ask for the internal medicine resident on call. If you feel you are having a medical emergency please call 911.   Thank you, we look forward to helping you remain healthy!

## 2020-09-16 LAB — LIPID PANEL
Chol/HDL Ratio: 7.3 ratio — ABNORMAL HIGH (ref 0.0–4.4)
Cholesterol, Total: 285 mg/dL — ABNORMAL HIGH (ref 100–199)
HDL: 39 mg/dL — ABNORMAL LOW (ref >39)
LDL Chol Calc (NIH): 202 mg/dL — ABNORMAL HIGH (ref 0–99)
Triglycerides: 226 mg/dL — ABNORMAL HIGH (ref 0–149)
VLDL Cholesterol Cal: 44 mg/dL — ABNORMAL HIGH (ref 5–40)

## 2020-09-16 LAB — CK: Total CK: 285 U/L — ABNORMAL HIGH (ref 32–182)

## 2020-09-16 NOTE — Assessment & Plan Note (Signed)
Patient with persistent bilateral leg pain and muscle weakness with elevated CK in the past that was attributed to possible statin induced myositis. However, she was noted to have persistent symptoms despite drug holiday. At her last visit, patient was evaluated for inflammatory myositis - Myositis panel was negative, LDH and aldolase normal.  Today, patient endorses persistent bilateral leg pain but notes that it is mostly at the hips and sometimes exacerbated by certain positions. On prior imaging, she does have bilateral hip osteoarthritis which may be contributing to this in addition to the lumbar radiculopathy with herniated discs. I suspect this may be contributing to her ongoing pain. Discussed symptomatic treatment of this pain with NSAIDs.  Plan: Checking CK levels and lipid panel

## 2020-09-16 NOTE — Assessment & Plan Note (Addendum)
Lipid Panel     Component Value Date/Time   CHOL 285 (H) 09/15/2020 1422   TRIG 226 (H) 09/15/2020 1422   HDL 39 (L) 09/15/2020 1422   CHOLHDL 7.3 (H) 09/15/2020 1422   CHOLHDL 4 08/16/2019 0928   VLDL 19.8 08/16/2019 0928   LDLCALC 202 (H) 09/15/2020 1422   LABVLDL 44 (H) 09/15/2020 1422   Patient was previously on atorvastatin 80mg  daily; however was discontinued in setting of elevated CK levels with bilateral lower extremity pain and weakness.  Given LDL>190, high intensity statin recommended. Will f/u with her CK levels, and if improved, can start her on Crestor.   Plan: F/u CK levels If improved, start on Crestor +/- Zetia    ADDENDUM: CK remains elevated ~285. However, her ASCVD risk is 17.2% of significant cardiovascular outcomes. At this time, will start patient on zetia 10mg  daily with pravastatin 40mg  daily.  Will repeat lipid panel in 6 weeks. If she develops symptoms of myopathy while on pravastatin, will check thyroid panel and vitamin D levels.

## 2020-09-16 NOTE — Assessment & Plan Note (Signed)
HbA1c 8.4 at this visit (stable from previous check). She is on Novolin 70/30 - 86U in AM and 89U in PM, Farxiga 10mg  daily, Metformin 1000mg  bid, and Ozempic 1mg  weekly. She follows with endocrinologist. Meter today with average CBG in 180's with postprandial highs up to 240's. No hypoglycemic episodes noted; however, meter only active 66% of time. Patient encouraged to scan four times daily. She notes that her daughter is encouraging her to switch to a plant based diet and is hopeful this will further help with her diabetes.   Plan: Increase Novolin 70/30 - 89U in AM and 89U in PM Continue metformin 1000mg  bid, Farxiga 10mg  daily and Ozempic 1mg  weekly F/u A1c in 3 months  Continue to follow up with endocrinology

## 2020-09-16 NOTE — Assessment & Plan Note (Signed)
Patient with history of chronic lumbar back pain radiating to bilateral lower extremities in setting of disc herniation noted on MRI dating back to 2011. She is followed by pain clinic. She was able to follow up with neurosurgery for this and is scheduled for L4-5 and L5-1 fusion surgery on 4/22. She is hopeful that this will help provide some relief of her back pain.   Plan: Continue to monitor  F/u planned surgery on 4/22

## 2020-09-16 NOTE — Assessment & Plan Note (Signed)
BP Readings from Last 3 Encounters:  09/15/20 (!) 111/55  09/04/20 134/78  08/07/20 130/63   Patient is currently on irbesartan 300mg  daily and HCTZ 25mg  daily. She endorses medication compliance. She denies any headaches, lightheadedness/dizziness, chest pain, shortness of breath or focal weakness at this time. BP is within goal today.  Plan: Continue irbesartan 300mg  daily and HCTZ 25mg  daily

## 2020-09-17 DIAGNOSIS — E1140 Type 2 diabetes mellitus with diabetic neuropathy, unspecified: Secondary | ICD-10-CM | POA: Diagnosis not present

## 2020-09-18 NOTE — Progress Notes (Signed)
Internal Medicine Clinic Attending ° °Case discussed with Dr. Aslam  At the time of the visit.  We reviewed the resident’s history and exam and pertinent patient test results.  I agree with the assessment, diagnosis, and plan of care documented in the resident’s note.  °

## 2020-09-22 MED ORDER — PRAVASTATIN SODIUM 40 MG PO TABS: 40.0000 mg | ORAL_TABLET | Freq: Every evening | ORAL | 11 refills | Status: DC

## 2020-09-22 MED ORDER — EZETIMIBE 10 MG PO TABS: 10.0000 mg | ORAL_TABLET | Freq: Every day | ORAL | 11 refills | Status: DC

## 2020-09-22 NOTE — Addendum Note (Signed)
Addended by: Harvie Heck on: 09/22/2020 09:45 AM   Modules accepted: Orders

## 2020-09-28 DIAGNOSIS — Z79899 Other long term (current) drug therapy: Secondary | ICD-10-CM | POA: Diagnosis not present

## 2020-09-28 DIAGNOSIS — M542 Cervicalgia: Secondary | ICD-10-CM | POA: Diagnosis not present

## 2020-09-28 DIAGNOSIS — M5136 Other intervertebral disc degeneration, lumbar region: Secondary | ICD-10-CM | POA: Diagnosis not present

## 2020-09-28 DIAGNOSIS — M546 Pain in thoracic spine: Secondary | ICD-10-CM | POA: Diagnosis not present

## 2020-09-28 DIAGNOSIS — I10 Essential (primary) hypertension: Secondary | ICD-10-CM | POA: Diagnosis not present

## 2020-09-28 DIAGNOSIS — M545 Low back pain, unspecified: Secondary | ICD-10-CM | POA: Diagnosis not present

## 2020-09-28 DIAGNOSIS — Z6841 Body Mass Index (BMI) 40.0 and over, adult: Secondary | ICD-10-CM | POA: Diagnosis not present

## 2020-10-02 ENCOUNTER — Inpatient Hospital Stay: Admit: 2020-10-02 | Payer: PPO | Admitting: Neurosurgery

## 2020-10-02 ENCOUNTER — Encounter: Payer: PPO | Admitting: Internal Medicine

## 2020-10-02 SURGERY — POSTERIOR LUMBAR FUSION 2 LEVEL
Anesthesia: General

## 2020-10-03 DIAGNOSIS — G4733 Obstructive sleep apnea (adult) (pediatric): Secondary | ICD-10-CM | POA: Diagnosis not present

## 2020-10-20 DIAGNOSIS — E1140 Type 2 diabetes mellitus with diabetic neuropathy, unspecified: Secondary | ICD-10-CM | POA: Diagnosis not present

## 2020-10-21 DIAGNOSIS — G4733 Obstructive sleep apnea (adult) (pediatric): Secondary | ICD-10-CM | POA: Diagnosis not present

## 2020-10-26 ENCOUNTER — Other Ambulatory Visit: Payer: Self-pay | Admitting: Internal Medicine

## 2020-10-26 DIAGNOSIS — A6004 Herpesviral vulvovaginitis: Secondary | ICD-10-CM

## 2020-10-30 DIAGNOSIS — M5136 Other intervertebral disc degeneration, lumbar region: Secondary | ICD-10-CM | POA: Diagnosis not present

## 2020-10-30 DIAGNOSIS — M546 Pain in thoracic spine: Secondary | ICD-10-CM | POA: Diagnosis not present

## 2020-10-30 DIAGNOSIS — M545 Low back pain, unspecified: Secondary | ICD-10-CM | POA: Diagnosis not present

## 2020-10-30 DIAGNOSIS — M542 Cervicalgia: Secondary | ICD-10-CM | POA: Diagnosis not present

## 2020-10-30 DIAGNOSIS — Z79899 Other long term (current) drug therapy: Secondary | ICD-10-CM | POA: Diagnosis not present

## 2020-10-30 DIAGNOSIS — Z6841 Body Mass Index (BMI) 40.0 and over, adult: Secondary | ICD-10-CM | POA: Diagnosis not present

## 2020-11-06 ENCOUNTER — Encounter: Payer: Self-pay | Admitting: Internal Medicine

## 2020-11-06 ENCOUNTER — Ambulatory Visit: Payer: PPO | Admitting: Internal Medicine

## 2020-11-06 ENCOUNTER — Other Ambulatory Visit: Payer: Self-pay

## 2020-11-06 ENCOUNTER — Other Ambulatory Visit: Payer: Self-pay | Admitting: Internal Medicine

## 2020-11-06 VITALS — BP 118/62 | HR 80 | Ht 64.0 in | Wt 244.5 lb

## 2020-11-06 DIAGNOSIS — Z794 Long term (current) use of insulin: Secondary | ICD-10-CM

## 2020-11-06 DIAGNOSIS — E1140 Type 2 diabetes mellitus with diabetic neuropathy, unspecified: Secondary | ICD-10-CM | POA: Diagnosis not present

## 2020-11-06 DIAGNOSIS — E1165 Type 2 diabetes mellitus with hyperglycemia: Secondary | ICD-10-CM | POA: Diagnosis not present

## 2020-11-06 LAB — BASIC METABOLIC PANEL
BUN: 12 mg/dL (ref 6–23)
CO2: 29 mEq/L (ref 19–32)
Calcium: 9.9 mg/dL (ref 8.4–10.5)
Chloride: 100 mEq/L (ref 96–112)
Creatinine, Ser: 0.68 mg/dL (ref 0.40–1.20)
GFR: 94.78 mL/min (ref >60.00)
Glucose, Bld: 154 mg/dL — ABNORMAL HIGH (ref 70–99)
Potassium: 3.8 mEq/L (ref 3.5–5.1)
Sodium: 139 mEq/L (ref 135–145)

## 2020-11-06 MED ORDER — NOVOLOG MIX 70/30 FLEXPEN (70-30) 100 UNIT/ML ~~LOC~~ SUPN: 80.0000 [IU] | PEN_INJECTOR | Freq: Two times a day (BID) | SUBCUTANEOUS | 11 refills | Status: DC

## 2020-11-06 MED ORDER — DAPAGLIFLOZIN PROPANEDIOL 10 MG PO TABS: 10.0000 mg | ORAL_TABLET | Freq: Every day | ORAL | 3 refills | Status: DC

## 2020-11-06 MED ORDER — OZEMPIC (2 MG/DOSE) 8 MG/3ML ~~LOC~~ SOPN: 2.0000 mg | PEN_INJECTOR | SUBCUTANEOUS | 3 refills | Status: DC

## 2020-11-06 NOTE — Patient Instructions (Addendum)
-   Continue Metformin 1 tablet Twice daily  - Increase  Ozempic 2  mg weekly  - Decrease Novolog Mix (70/30) 80  units with  breakfast and 80  units with Supper  - Continue  Farxiga 10 mg , 1 tablet with breakfast      HOW TO TREAT LOW BLOOD SUGARS (Blood sugar LESS THAN 70 MG/DL)  Please follow the RULE OF 15 for the treatment of hypoglycemia treatment (when your (blood sugars are less than 70 mg/dL)    STEP 1: Take 15 grams of carbohydrates when your blood sugar is low, which includes:   3-4 GLUCOSE TABS  OR  3-4 OZ OF JUICE OR REGULAR SODA OR  ONE TUBE OF GLUCOSE GEL     STEP 2: RECHECK blood sugar in 15 MINUTES STEP 3: If your blood sugar is still low at the 15 minute recheck --> then, go back to STEP 1 and treat AGAIN with another 15 grams of carbohydrates.

## 2020-11-06 NOTE — Progress Notes (Signed)
Name: [name]  Age/ Sex: 60 y.o., female   MRN/ DOB: [id number], 11-30-1960     PCP: Harvie Heck, MD   Reason for Endocrinology Evaluation: Type 2 Diabetes Mellitus  Initial Endocrine Consultative Visit: 03/18/2019    PATIENT IDENTIFIER: [name] is a 60 y.o. female with a past medical history of T2DM, HTN, OSA and dyslipidemia . The patient has followed with Endocrinology clinic since 03/18/2019 for consultative assistance with management of her diabetes.  DIABETIC HISTORY:  [name] was diagnosed with T2DM many years ago. Has been on Soliqua, Glipizide and V-Go was started in 2019.  Her hemoglobin A1c has ranged from 8.1% in 2016, peaking at 9.8% in 2020.  On her initial visit to our clinic, she was on V-Go 40 with Humulin U-500 , Metformin, and Ozempic with an A1c 9.7% . We stopped the V-Go  And the U-500 due to recurrent hypoglycemia . We started Novolog Mix and continued metformin and Ozempic.   SGLT-2 inhibitor started through PCP 03/2020  SUBJECTIVE:   During the last visit (07/06/2020): A1c 8.3% .  We increase Farxiga, continued Ozempic, metformin, and NovoLog Mix     Today (11/06/2020): [name] is here for a follow up on her diabetes care.  She checks her blood sugars multiple times daily, through freestyle libre. The patient has not  had hypoglycemic episodes since the last clinic visit.   Denies nausea or diarrhea  Has right foot pain    HOME DIABETES REGIMEN:  Metformin 1000 tablet Twice daily  Ozempic 1 mg weekly  Novolog Mix (70/30) 84 units with breakfast and 88 units with Supper- 86 units QAM and 89 units QPM  Farxiga 10 mg daily     CONTINUOUS GLUCOSE MONITORING RECORD INTERPRETATION    Dates of Recording:5/14-5/27/2022 Sensor description: [employer]  Results statistics:   CGM use % of time 62  Average and SD 133/31.9  Time in range  84 %  % Time Above 180 13  % Time above 250 3  % Time Below target 0    Glycemic patterns  summary: Hyperglycemia noted after breakfast,  Rest of the time BG's are optimal  Hyperglycemic episodes post prandial   Hypoglycemic episodes occurred night time  Overnight periods: trends down      DIABETIC COMPLICATIONS: Microvascular complications:   Neuropathy   Denies: CKD, retinopathy   Last eye exam: Completed 07/21/2020  Macrovascular complications:    Denies: CAD, PVD, CVA   HISTORY:  Past Medical History:  Past Medical History:  Diagnosis Date  . Allergy   . Arthritis    back   . Asthma    AS CHILD  . Chronic back pain   . Chronic leg pain    due to back pain  . Cocaine abuse ([place])    in remission  . Diabetes ([facility])    with neuropathy  . GERD (gastroesophageal reflux disease)   . Hyperlipidemia   . Hypertension   . Intraductal papilloma of left breast 02/18/2016  . Neuromuscular disorder ([facility])    neuropathy  . Postlaminectomy syndrome of lumbar region 12/07/2011  . RECTAL BLEEDING 12/09/2008   Annotation: s/p EGD 7/08 mild gastritis, s/p colonoscopy 7/08- benign polyp  s/p polypectomy and isolated diverticulum.  Qualifier: Diagnosis of  By: Ditzler RN, Debra    . Sciatica    per patient   . Sleep apnea    CPAP    YEARS AGO DONE 1/2 YEARS AGO AND WAS TOLD DID  NOT HAVE  . Tobacco abuse   . Uterine fibroid    s/p hysterectomy   Past Surgical History:  Past Surgical History:  Procedure Laterality Date  . BACK SURGERY  2012   L5-S1 microendoscopic disectomy last surgery 06/2011  . BREAST BIOPSY     LEFT    01/19/16  . BREAST EXCISIONAL BIOPSY Left 02/2016  . BREAST REDUCTION SURGERY  1982  . CATARACT EXTRACTION Right 11/07/2019  . COLONOSCOPY    . HAND SURGERY     MIDDLE TRIGGER FINGER RIGHT SIDE  . RADIOACTIVE SEED GUIDED EXCISIONAL BREAST BIOPSY Left 02/18/2016   Procedure: LEFT RADIOACTIVE SEED GUIDED EXCISIONAL BREAST BIOPSY;  Surgeon: Alphonsa Overall, MD;  Location: [facility];  Service: General;  Laterality: Left;  . REDUCTION MAMMAPLASTY Bilateral    . TONSILLECTOMY  2008  . TOTAL ABDOMINAL HYSTERECTOMY  05/24/2007   hysterectomy  . TUBAL LIGATION    . UPPER GASTROINTESTINAL ENDOSCOPY      Social History:  reports that she quit smoking about 24 years ago. Her smoking use included cigarettes. She has a 10.00 pack-year smoking history. She has never used smokeless tobacco. She reports that she does not drink alcohol and does not use drugs. Family History:  Family History  Problem Relation Age of Onset  . Diabetes Father   . Heart disease Father   . Hypertension Father   . Diabetes Mother   . Cancer Mother        brain  . Hypertension Mother   . Kidney disease Sister   . Diabetes Sister   . Kidney cancer Sister   . Stroke Sister   . Diabetes Sister   . Hypertension Sister   . Diabetes Sister   . Hypertension Sister   . Obesity Son   . Colon cancer Neg Hx   . Colon polyps Neg Hx   . Esophageal cancer Neg Hx   . Rectal cancer Neg Hx   . Stomach cancer Neg Hx      HOME MEDICATIONS: Allergies as of 11/06/2020      Reactions   Lisinopril Cough      Medication List       Accurate as of Nov 06, 2020 10:01 AM. If you have any questions, ask your nurse or doctor.        acyclovir 400 MG tablet Commonly known as: ZOVIRAX TAKE 1 TABLET BY MOUTH THREE TIMES DAILY AS NEEDED (FOR  FLARES)   albuterol 108 (90 Base) MCG/ACT inhaler Commonly known as: VENTOLIN HFA Inhale 2 puffs into the lungs every 6 (six) hours as needed for wheezing or shortness of breath.   aspirin 81 MG EC tablet Take 81 mg by mouth daily.   budesonide-formoterol 80-4.5 MCG/ACT inhaler Commonly known as: Symbicort Inhale 2 puffs then rinse mouth, twice daily- maintenance   buprenorphine 5 MCG/HR Ptwk Commonly known as: BUTRANS 1 patch once a week.   chlorpheniramine 4 MG tablet Commonly known as: CHLOR-TRIMETON Take 4 mg by mouth every 4 (four) hours as needed for allergies.   Continuous Glucose Monitor Devi Use as directed.    dapagliflozin propanediol 10 MG Tabs tablet Commonly known as: Farxiga Take 1 tablet (10 mg total) by mouth daily.   diclofenac Sodium 1 % Gel Commonly known as: VOLTAREN Apply 2 g topically 4 (four) times daily. Rub into affected area of foot 2 to 4 times daily   dicyclomine 10 MG capsule Commonly known as: BENTYL Take 1 capsule (10 mg total) by mouth  3 (three) times daily before meals.   esomeprazole 40 MG capsule Commonly known as: NEXIUM Take 1 capsule (40 mg total) by mouth 2 (two) times daily before a meal.   ezetimibe 10 MG tablet Commonly known as: Zetia Take 1 tablet (10 mg total) by mouth daily.   FreeStyle Libre 14 Day Sensor Misc USE TO CHECK BLOOD SUGARS 4 TIMES DAILY   FreeStyle Libre 2 Reader Systm Devi 1 each by Does not apply route 4 (four) times daily.   GARLIC 3149 PO Take by mouth.   hydrochlorothiazide 25 MG tablet Commonly known as: HYDRODIURIL Take 1 tablet by mouth once daily   Insulin Pen Needle 32G X 4 MM Misc Use to inject insulin two times a day   INSULIN SYRINGE 1CC/30GX1/2" 30G X 1/2" 1 ML Misc Use to fill Vgo daily   irbesartan 300 MG tablet Commonly known as: Avapro Take 1 tablet (300 mg total) by mouth daily.   metFORMIN 1000 MG tablet Commonly known as: GLUCOPHAGE Take 1 tablet (1,000 mg total) by mouth 2 (two) times daily with a meal.   methocarbamol 500 MG tablet Commonly known as: ROBAXIN Take 1 tablet (500 mg total) by mouth every 8 (eight) hours as needed for muscle spasms.   montelukast 10 MG tablet Commonly known as: SINGULAIR Take 1 tablet (10 mg total) by mouth at bedtime.   multivitamin with minerals Tabs tablet Take 1 tablet by mouth daily.   Narcan 4 MG/0.1ML Liqd nasal spray kit Generic drug: naloxone USE 1 SPRAY IN THE NOSE AS NEEDED   NovoLOG Mix 70/30 FlexPen (70-30) 100 UNIT/ML FlexPen Generic drug: insulin aspart protamine - aspart 86 units in AM; 88 units in PM   OneTouch Delica Lancets Fine  Misc Check blood sugar 3 times a day   OneTouch Verio Flex System w/Device Kit 1 each by Does not apply route 3 (three) times daily.   OneTouch Verio test strip Generic drug: glucose blood CHECK BLOOD SUGAR 3 TIMES A DAY   oxyCODONE-acetaminophen 10-325 MG tablet Commonly known as: PERCOCET Take 1 tablet by mouth 4 (four) times daily as needed.   Ozempic (1 MG/DOSE) 4 MG/3ML Sopn Generic drug: Semaglutide (1 MG/DOSE) INJECT 1 MG INTO THE SKIN ONCE A WEEK   pravastatin 40 MG tablet Commonly known as: Pravachol Take 1 tablet (40 mg total) by mouth every evening.   tiZANidine 4 MG capsule Commonly known as: ZANAFLEX Take 4 mg by mouth 3 (three) times daily as needed.   valACYclovir 500 MG tablet Commonly known as: VALTREX TAKE 1 TABLET BY MOUTH TWICE DAILY FOR 5 DAYS AT  TIME  OF  OUTBREAK        OBJECTIVE:   Vital Signs: BP 118/62   Pulse 80   Ht 5' 4"  (1.626 m)   Wt 244 lb 8 oz (110.9 kg)   SpO2 96%   BMI 41.97 kg/m   Wt Readings from Last 3 Encounters:  09/15/20 244 lb 3.2 oz (110.8 kg)  09/04/20 247 lb (112 kg)  08/07/20 245 lb 1.6 oz (111.2 kg)     Exam: General: Pt appears well and is in NAD  Lungs: Clear with good BS bilat with no rales, rhonchi, or wheezes  Heart: RRR with normal S1 and S2 and no gallops; no murmurs; no rub  Extremities: No pretibial edema.   Skin: Normal texture and temperature to palpation.   Neuro: MS is good with appropriate affect, pt is alert and Ox3  DM foot exam: 07/06/2020  The skin of the feet is intact without sores or ulcerations. The pedal pulses are 2+ on right and 2+ on left. The sensation is intact to a screening 5.07, 10 gram monofilament bilaterally          DATA REVIEWED:  Lab Results  Component Value Date   HGBA1C 8.4 (A) 09/15/2020   HGBA1C 8.3 (A) 07/06/2020   HGBA1C 9.1 (A) 04/10/2020   Lab Results  Component Value Date   MICROALBUR 0.99 09/30/2009   LDLCALC 202 (H) 09/15/2020   CREATININE  0.69 07/10/2020   Lab Results  Component Value Date   MICRALBCREAT 15 02/12/2019     Lab Results  Component Value Date   CHOL 285 (H) 09/15/2020   HDL 39 (L) 09/15/2020   LDLCALC 202 (H) 09/15/2020   TRIG 226 (H) 09/15/2020   CHOLHDL 7.3 (H) 09/15/2020      Results for RAAGA, MAEDER (MRN [id number]) as of 11/09/2020 19:53  Ref. Range 11/06/2020 13:48  Sodium Latest Ref Range: 135 - 145 mEq/L 139  Potassium Latest Ref Range: 3.5 - 5.1 mEq/L 3.8  Chloride Latest Ref Range: 96 - 112 mEq/L 100  CO2 Latest Ref Range: 19 - 32 mEq/L 29  Glucose Latest Ref Range: 70 - 99 mg/dL 154 (H)  BUN Latest Ref Range: 6 - 23 mg/dL 12  Creatinine Latest Ref Range: 0.40 - 1.20 mg/dL 0.68  Calcium Latest Ref Range: 8.4 - 10.5 mg/dL 9.9      ASSESSMENT / PLAN / RECOMMENDATIONS:   1) Type 2 Diabetes Mellitus, With improved glycemic control, With Neuropathy complications - Most recent A1c of 8.4  %. Goal A1c < 7.0 %.     - I have praised the pt on improving  glucose control  - Will make the following adjustments - She is pending back sx once A1c < 7.5 % - Will reduce insulin due to overnight hypoglycemia     MEDICATIONS: - Continue Metformin 1000 mg,  1 tablet Twice daily  - Increase Ozempic 2 mg weekly  - Decrease Novolog Mix (70/30) 80 units with breakfast and 80 units with supper  - Continue Farxiga 10 mg daily    EDUCATION / INSTRUCTIONS:  BG monitoring instructions: Patient is instructed to check her blood sugars 2 times a day, fasting and bedtime .  Call [facility] if: BG persistently < 70  . I reviewed the Rule of 15 for the treatment of hypoglycemia in detail with the patient. Literature supplied.   2. Dyslipidemia :   She was on Atrovastatin at somepoint but developed leg pains and we switched to Rosuvastatin but her LDL has increased from 110 to 202 mg/dL .Will advised the pt the importance of compliance  If she is intolerant to statins, will consider PCSK-9  inhibitors.   F/U in  months    Signed electronically by: Mack Guise, MD  [facility]  [facility] [address] [place], [place] [address] Phone: [phone] FAX: [phone]   CC: Harvie Heck, MD [address] [place] [address] Phone: [phone]  Fax: [phone]  Return to Endocrinology clinic as below: Future Appointments  Date Time Provider [place]  11/06/2020  1:00 PM Gershom Brobeck, Melanie Crazier, MD [facility] None  11/17/2020 11:15 AM Trula Slade, DPM [facility]  11/26/2020  9:00 AM Deneise Lever, MD [facility] None  04/14/2021  9:15 AM Bo Merino, MD [facility] None

## 2020-11-09 ENCOUNTER — Encounter: Payer: Self-pay | Admitting: Internal Medicine

## 2020-11-10 ENCOUNTER — Other Ambulatory Visit: Payer: Self-pay | Admitting: Internal Medicine

## 2020-11-10 ENCOUNTER — Telehealth: Payer: Self-pay

## 2020-11-10 DIAGNOSIS — E1140 Type 2 diabetes mellitus with diabetic neuropathy, unspecified: Secondary | ICD-10-CM

## 2020-11-10 NOTE — Telephone Encounter (Signed)
Pt stated that the pharmacy  [facility] told her that her pcp needs to release her medicine . She is requesting a call back

## 2020-11-10 NOTE — Telephone Encounter (Signed)
Called pt to find out which medication she's referring - no answer; left message to call the office.

## 2020-11-17 ENCOUNTER — Other Ambulatory Visit: Payer: Self-pay

## 2020-11-17 ENCOUNTER — Ambulatory Visit (INDEPENDENT_AMBULATORY_CARE_PROVIDER_SITE_OTHER): Payer: PPO | Admitting: Podiatry

## 2020-11-17 DIAGNOSIS — M79675 Pain in left toe(s): Secondary | ICD-10-CM | POA: Diagnosis not present

## 2020-11-17 DIAGNOSIS — M2042 Other hammer toe(s) (acquired), left foot: Secondary | ICD-10-CM

## 2020-11-17 DIAGNOSIS — M79674 Pain in right toe(s): Secondary | ICD-10-CM

## 2020-11-17 DIAGNOSIS — M2041 Other hammer toe(s) (acquired), right foot: Secondary | ICD-10-CM

## 2020-11-17 DIAGNOSIS — B351 Tinea unguium: Secondary | ICD-10-CM

## 2020-11-17 DIAGNOSIS — E1165 Type 2 diabetes mellitus with hyperglycemia: Secondary | ICD-10-CM

## 2020-11-20 DIAGNOSIS — E1140 Type 2 diabetes mellitus with diabetic neuropathy, unspecified: Secondary | ICD-10-CM | POA: Diagnosis not present

## 2020-11-22 NOTE — Progress Notes (Signed)
Subjective: 60 year old female presents the office today for diabetic foot exam.  She is requesting diabetic shoes as well.  As of the nails return to is a thick elongated she cannot do them herself.  Denies any redness or drainage or any swelling.  Last A1c on September 15, 2020 was 8.4  Objective: AAO x3, NAD Sensation decreased with Thornell Mule monofilament. DP/PT pulses palpable bilaterally, CRT less than 3 seconds Nails are hypertrophic, dystrophic, brittle, discolored, elongated 10. No surrounding redness or drainage. Tenderness nails 1-5 bilaterally. No open lesions or pre-ulcerative lesions are identified today. Hammertoes present No pain with calf compression, swelling, warmth, erythema  Assessment: 60 year old female with uncontrolled diabetes, symptomatic onychomycosis  Plan: -All treatment options discussed with the patient including all alternatives, risks, complications.  -Sharp debrided the nails x10 without any complications or bleeding. -Discussed daily foot inspection  Return in about 3 months   Trula Slade DPM

## 2020-11-23 DIAGNOSIS — E1140 Type 2 diabetes mellitus with diabetic neuropathy, unspecified: Secondary | ICD-10-CM | POA: Diagnosis not present

## 2020-11-24 ENCOUNTER — Other Ambulatory Visit: Payer: Self-pay | Admitting: Internal Medicine

## 2020-11-24 DIAGNOSIS — M545 Low back pain, unspecified: Secondary | ICD-10-CM | POA: Diagnosis not present

## 2020-11-24 DIAGNOSIS — Z1231 Encounter for screening mammogram for malignant neoplasm of breast: Secondary | ICD-10-CM

## 2020-11-24 DIAGNOSIS — M546 Pain in thoracic spine: Secondary | ICD-10-CM | POA: Diagnosis not present

## 2020-11-24 DIAGNOSIS — Z6841 Body Mass Index (BMI) 40.0 and over, adult: Secondary | ICD-10-CM | POA: Diagnosis not present

## 2020-11-24 DIAGNOSIS — M5136 Other intervertebral disc degeneration, lumbar region: Secondary | ICD-10-CM | POA: Diagnosis not present

## 2020-11-24 DIAGNOSIS — M542 Cervicalgia: Secondary | ICD-10-CM | POA: Diagnosis not present

## 2020-11-24 DIAGNOSIS — I10 Essential (primary) hypertension: Secondary | ICD-10-CM | POA: Diagnosis not present

## 2020-11-24 DIAGNOSIS — Z79899 Other long term (current) drug therapy: Secondary | ICD-10-CM | POA: Diagnosis not present

## 2020-11-25 DIAGNOSIS — G4733 Obstructive sleep apnea (adult) (pediatric): Secondary | ICD-10-CM | POA: Diagnosis not present

## 2020-11-25 NOTE — Progress Notes (Signed)
HPI F former smoker followed for OSA, complicated by  HBP, COPD GOLD II, Hepatic steatosis, GERD, DM2, Lung nodule< 6 cm NPSG 08/11/17- AHI 17.7/ hr, desaturation to 85%, 255 lbs PFT- 01/31/17- moderate obst, insignif resp to BD, mild DLCO deficit -----------------------------------------------------------------------------------  05/28/20- 59 yoF former smoker followed for OSA, complicated by  HBP, COPD GOLD II, Hepatic steatosis, GERD, DM2, Sciatica, Morbid Obesity,  Dr Melvyn Novas has seen her for cough, last on 01/31/2019 CPAP auto 10-20/ [employer] Download- compliance 90%, AHI 0.5/ hr Body weight today- 252 lbs Covid vax- 3 Phizer Flu vax- had Comfortable with CPAP. Download reviewed with her. Notes more alert during the day when she uses it.  Over past 6 months more aware of mild wheeze and dyspnea climbing 8 steps at work. Denies dyspnea or wheeze at rest. Denies cough, phlegm, edema, palpitation.  Uses Symbicort about 1x every other day as a rescue inhaler. Med talk done. Not aware of any heart issues.  CXR 11/16/17- IMPRESSION: No active cardiopulmonary disease.  11/26/20-  61 yoF former smoker followed for OSA, complicated by  HBP, COPD GOLD II, Hepatic steatosis, GERD, DM2, Sciatica, Morbid Obesity, Chronic Low Back Pain/ [facility],  Dr Melvyn Novas has seen her for cough CPAP auto 10-20/ [employer] Download- compliance 93%, AHI 0.4/ hr Body weight today-243 lbs Covid vax- -----Was seen by NP in March- preop clearance before lumbar spine fusion in April- not done. Sometimes wakes up with a productive cough in the morning with using CPAP, brownish sputum. Recent week or 2. No fever.  Fine with CPAP- no concerns. Mentions occ heartburn at night. Takes PeptoBismol and notes "black tongue' next morning. Explained that is the PB, not the CPAP. Discuss heartburn with PCP. CXR 05/28/20- IMPRESSION: No acute process in the chest.   ROS-see HPI   + = positive Constitutional:    weight loss, night  sweats, fevers, chills, fatigue, lassitude. HEENT:    headaches, difficulty swallowing, tooth/dental problems, sore throat,       sneezing, itching, ear ache, +nasal congestion, post nasal drip, snoring CV:    chest pain, orthopnea, PND, +swelling in lower extremities, anasarca,                                  dizziness, palpitations Resp:  + shortness of breath with exertion or at rest.                productive cough,   non-productive cough, coughing up of blood.              change in color of mucus. + wheezing.   Skin:    rash or lesions. GI:  + heartburn, indigestion, +abdominal pain, nausea, vomiting, diarrhea,                 change in bowel habits, loss of appetite GU: dysuria, change in color of urine, no urgency or frequency.   flank pain. MS:   +joint pain, stiffness, decreased range of motion, back pain. Neuro-     nothing unusual Psych:  change in mood or affect.  depression or anxiety.   memory loss.  OBJ- Physical Exam General- Alert, Oriented, Affect-appropriate, Distress- none acute, + obese Skin- rash-none, lesions- none, excoriation- none Lymphadenopathy- none Head- atraumatic            Eyes- Gross vision intact, PERRLA, conjunctivae and secretions clear  Ears- Hearing, canals-normal            Nose- Clear, no-Septal dev, mucus, polyps, erosion, perforation             Throat- Mallampati IV , mucosa clear , drainage- none, tonsils- atrophic Neck- flexible , trachea midline, no stridor , thyroid nl, carotid no bruit Chest - symmetrical excursion , unlabored           Heart/CV- RRR , no murmur , no gallop  , no rub, nl s1 s2                           - JVD- none , edema- none, stasis changes- none, varices- none           Lung- clear to P&A, wheeze- none, cough- none , dullness-none, rub- none           Chest wall-  Abd-  Br/ Gen/ Rectal- Not done, not indicated Extrem- cyanosis- none, clubbing, none, atrophy- none, strength- nl Neuro- grossly intact to  observation

## 2020-11-26 ENCOUNTER — Encounter: Payer: Self-pay | Admitting: Internal Medicine

## 2020-11-26 ENCOUNTER — Other Ambulatory Visit: Payer: Self-pay

## 2020-11-26 ENCOUNTER — Ambulatory Visit: Payer: PPO | Admitting: Internal Medicine

## 2020-11-26 DIAGNOSIS — J449 Chronic obstructive pulmonary disease, unspecified: Secondary | ICD-10-CM

## 2020-11-26 DIAGNOSIS — K219 Gastro-esophageal reflux disease without esophagitis: Secondary | ICD-10-CM

## 2020-11-26 DIAGNOSIS — Z9989 Dependence on other enabling machines and devices: Secondary | ICD-10-CM | POA: Diagnosis not present

## 2020-11-26 DIAGNOSIS — G4733 Obstructive sleep apnea (adult) (pediatric): Secondary | ICD-10-CM

## 2020-11-26 MED ORDER — AZITHROMYCIN 250 MG PO TABS: ORAL_TABLET | ORAL | 0 refills | Status: DC

## 2020-11-26 NOTE — Assessment & Plan Note (Signed)
She is treating episodic reflux with PeptoBismol. Plan- reflux precautions. She is to discuss with her POCP.

## 2020-11-26 NOTE — Assessment & Plan Note (Signed)
Has lost some weight compared with original sleep study Plan- keep working on weight with diet, exercise

## 2020-11-26 NOTE — Assessment & Plan Note (Signed)
Benefits from CPAP with satisfactory compliance and excellent control. Plan - continue auto 10-20

## 2020-11-26 NOTE — Patient Instructions (Signed)
Zpak antibiotic was sent to your [facility] to see if it helps the bronchitis  You are doing very well with your CPAP- we can continue auto 10-20.  Please cal if we can help

## 2020-11-26 NOTE — Assessment & Plan Note (Signed)
She doesn't relate this to cough problem for which she was seen in past. Note her questions about heart burn suggest potential reflux connection. Because she describes this cough as recent with brown sputum, will try treating as acute bronchitis with ZPAK. Plan- Zpak, continue inhalers

## 2020-11-27 ENCOUNTER — Other Ambulatory Visit: Payer: PPO

## 2020-11-27 MED ORDER — ROSUVASTATIN CALCIUM 10 MG PO TABS: 10.0000 mg | ORAL_TABLET | Freq: Every day | ORAL | 3 refills | Status: DC

## 2020-11-30 ENCOUNTER — Other Ambulatory Visit: Payer: PPO

## 2020-12-02 ENCOUNTER — Other Ambulatory Visit: Payer: Self-pay

## 2020-12-02 ENCOUNTER — Ambulatory Visit (INDEPENDENT_AMBULATORY_CARE_PROVIDER_SITE_OTHER): Payer: PPO | Admitting: Podiatry

## 2020-12-02 DIAGNOSIS — E1165 Type 2 diabetes mellitus with hyperglycemia: Secondary | ICD-10-CM

## 2020-12-02 DIAGNOSIS — M779 Enthesopathy, unspecified: Secondary | ICD-10-CM

## 2020-12-02 DIAGNOSIS — M2041 Other hammer toe(s) (acquired), right foot: Secondary | ICD-10-CM

## 2020-12-02 DIAGNOSIS — M2042 Other hammer toe(s) (acquired), left foot: Secondary | ICD-10-CM

## 2020-12-02 NOTE — Progress Notes (Signed)
Patient presented for foam casting for 3 pair custom diabetic shoe inserts. Patient is measured with a brannock device to be a size 10 medium  Diabetic shoes are chosen from the [employer] catalog. The shoes are chosen are [id number]   The patient will be contacted when the shoes and inserts are ready to be picked up

## 2020-12-09 ENCOUNTER — Telehealth: Payer: Self-pay | Admitting: *Deleted

## 2020-12-09 ENCOUNTER — Telehealth: Payer: Self-pay | Admitting: Internal Medicine

## 2020-12-09 MED ORDER — AZITHROMYCIN 250 MG PO TABS: ORAL_TABLET | ORAL | 0 refills | Status: DC

## 2020-12-09 MED ORDER — NYSTATIN 100000 UNIT/ML MT SUSP: OROMUCOSAL | 0 refills | Status: DC

## 2020-12-09 NOTE — Telephone Encounter (Signed)
Please send in Rx to repeat z pack x 1 Take as directed Send in Nystatin mouthwash for thrush. 4-6 cc's four times daily continue for 48 hours after symptoms clear. Dispense 240 cc's

## 2020-12-09 NOTE — Telephone Encounter (Signed)
I have called and LM on VM

## 2020-12-09 NOTE — Telephone Encounter (Signed)
Medications have been sent to the pharmacy for the pt.  I called the pt and made her aware.  Nothing further is needed.

## 2020-12-09 NOTE — Telephone Encounter (Signed)
Pt called back.  She stated that she is needing something to help clear up the thrush that she has in her mouth.  She has the white film over her tongue.    She stated that CY started her on zpak and told her to call back if this was helping and he would extend the rx for her.  She stated that the zpak was given to her for the congestion that she was coughing up was brown and he felt that she may have bronchitis.  She stated that it has helped some but this is not cleared up all the way an still having some brown sputum.  CY is out of the office this week.  SG please advise. Thanks  Allergies  Allergen Reactions   Lisinopril Cough

## 2020-12-09 NOTE — Telephone Encounter (Signed)
Forms for Diabetic shoes faxed to [employer] at [phone]. Ph [phone] opt 1. This was done through [facility].

## 2020-12-15 ENCOUNTER — Encounter: Payer: Self-pay | Admitting: *Deleted

## 2020-12-24 DIAGNOSIS — Z79899 Other long term (current) drug therapy: Secondary | ICD-10-CM | POA: Diagnosis not present

## 2020-12-24 DIAGNOSIS — M5136 Other intervertebral disc degeneration, lumbar region: Secondary | ICD-10-CM | POA: Diagnosis not present

## 2020-12-24 DIAGNOSIS — M542 Cervicalgia: Secondary | ICD-10-CM | POA: Diagnosis not present

## 2020-12-24 DIAGNOSIS — M546 Pain in thoracic spine: Secondary | ICD-10-CM | POA: Diagnosis not present

## 2020-12-24 DIAGNOSIS — Z6841 Body Mass Index (BMI) 40.0 and over, adult: Secondary | ICD-10-CM | POA: Diagnosis not present

## 2020-12-24 DIAGNOSIS — M545 Low back pain, unspecified: Secondary | ICD-10-CM | POA: Diagnosis not present

## 2020-12-24 DIAGNOSIS — E1140 Type 2 diabetes mellitus with diabetic neuropathy, unspecified: Secondary | ICD-10-CM | POA: Diagnosis not present

## 2020-12-24 DIAGNOSIS — I10 Essential (primary) hypertension: Secondary | ICD-10-CM | POA: Diagnosis not present

## 2020-12-29 DIAGNOSIS — G4733 Obstructive sleep apnea (adult) (pediatric): Secondary | ICD-10-CM | POA: Diagnosis not present

## 2020-12-30 ENCOUNTER — Telehealth: Payer: Self-pay | Admitting: Internal Medicine

## 2020-12-30 ENCOUNTER — Other Ambulatory Visit: Payer: Self-pay | Admitting: Dietician

## 2020-12-30 DIAGNOSIS — E1140 Type 2 diabetes mellitus with diabetic neuropathy, unspecified: Secondary | ICD-10-CM

## 2020-12-30 DIAGNOSIS — Z794 Long term (current) use of insulin: Secondary | ICD-10-CM

## 2020-12-30 MED ORDER — FREESTYLE LIBRE 2 SENSOR MISC: 0 refills | Status: DC
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
## 2020-12-31 ENCOUNTER — Other Ambulatory Visit: Payer: Self-pay

## 2020-12-31 DIAGNOSIS — Z794 Long term (current) use of insulin: Secondary | ICD-10-CM

## 2020-12-31 DIAGNOSIS — E1140 Type 2 diabetes mellitus with diabetic neuropathy, unspecified: Secondary | ICD-10-CM

## 2020-12-31 MED ORDER — FREESTYLE LIBRE 2 SENSOR MISC: 0 refills | Status: DC

## 2020-12-31 NOTE — Telephone Encounter (Signed)
Sent refills to [facility]

## 2021-01-14 ENCOUNTER — Other Ambulatory Visit: Payer: Self-pay | Admitting: Gastroenterology

## 2021-01-14 DIAGNOSIS — K2970 Gastritis, unspecified, without bleeding: Secondary | ICD-10-CM

## 2021-01-14 DIAGNOSIS — R1013 Epigastric pain: Secondary | ICD-10-CM

## 2021-01-19 ENCOUNTER — Other Ambulatory Visit: Payer: Self-pay

## 2021-01-19 ENCOUNTER — Ambulatory Visit
Admission: RE | Admit: 2021-01-19 | Discharge: 2021-01-19 | Disposition: A | Discharge status: Home / Self Care | Payer: PPO | Source: Ambulatory Visit | Attending: Internal Medicine | Admitting: Internal Medicine

## 2021-01-19 DIAGNOSIS — Z1231 Encounter for screening mammogram for malignant neoplasm of breast: Secondary | ICD-10-CM

## 2021-01-22 ENCOUNTER — Other Ambulatory Visit: Payer: Self-pay | Admitting: Internal Medicine

## 2021-01-22 DIAGNOSIS — R928 Other abnormal and inconclusive findings on diagnostic imaging of breast: Secondary | ICD-10-CM

## 2021-01-25 DIAGNOSIS — E1140 Type 2 diabetes mellitus with diabetic neuropathy, unspecified: Secondary | ICD-10-CM | POA: Diagnosis not present

## 2021-01-26 ENCOUNTER — Telehealth: Payer: Self-pay | Admitting: Podiatry

## 2021-01-26 DIAGNOSIS — M546 Pain in thoracic spine: Secondary | ICD-10-CM | POA: Diagnosis not present

## 2021-01-26 DIAGNOSIS — M545 Low back pain, unspecified: Secondary | ICD-10-CM | POA: Diagnosis not present

## 2021-01-26 DIAGNOSIS — M5136 Other intervertebral disc degeneration, lumbar region: Secondary | ICD-10-CM | POA: Diagnosis not present

## 2021-01-26 DIAGNOSIS — M542 Cervicalgia: Secondary | ICD-10-CM | POA: Diagnosis not present

## 2021-01-26 DIAGNOSIS — Z6841 Body Mass Index (BMI) 40.0 and over, adult: Secondary | ICD-10-CM | POA: Diagnosis not present

## 2021-01-26 DIAGNOSIS — Z79899 Other long term (current) drug therapy: Secondary | ICD-10-CM | POA: Diagnosis not present

## 2021-01-26 DIAGNOSIS — I10 Essential (primary) hypertension: Secondary | ICD-10-CM | POA: Diagnosis not present

## 2021-01-26 NOTE — Telephone Encounter (Signed)
Diabetic shoes/inserts ...lvm for pt to call to schedule an appt to pick them up.

## 2021-01-28 DIAGNOSIS — G4733 Obstructive sleep apnea (adult) (pediatric): Secondary | ICD-10-CM | POA: Diagnosis not present

## 2021-02-02 ENCOUNTER — Ambulatory Visit (INDEPENDENT_AMBULATORY_CARE_PROVIDER_SITE_OTHER): Payer: PPO | Admitting: Emergency Medicine

## 2021-02-02 ENCOUNTER — Other Ambulatory Visit: Payer: Self-pay

## 2021-02-02 ENCOUNTER — Encounter: Payer: Self-pay | Admitting: Emergency Medicine

## 2021-02-02 VITALS — BP 136/80 | HR 85 | Temp 98.9°F | Ht 64.0 in | Wt 245.0 lb

## 2021-02-02 DIAGNOSIS — G4733 Obstructive sleep apnea (adult) (pediatric): Secondary | ICD-10-CM | POA: Diagnosis not present

## 2021-02-02 DIAGNOSIS — Z23 Encounter for immunization: Secondary | ICD-10-CM

## 2021-02-02 DIAGNOSIS — E1159 Type 2 diabetes mellitus with other circulatory complications: Secondary | ICD-10-CM

## 2021-02-02 DIAGNOSIS — M545 Low back pain, unspecified: Secondary | ICD-10-CM | POA: Diagnosis not present

## 2021-02-02 DIAGNOSIS — E785 Hyperlipidemia, unspecified: Secondary | ICD-10-CM

## 2021-02-02 DIAGNOSIS — J449 Chronic obstructive pulmonary disease, unspecified: Secondary | ICD-10-CM

## 2021-02-02 DIAGNOSIS — Z79891 Long term (current) use of opiate analgesic: Secondary | ICD-10-CM | POA: Diagnosis not present

## 2021-02-02 DIAGNOSIS — Z9989 Dependence on other enabling machines and devices: Secondary | ICD-10-CM

## 2021-02-02 DIAGNOSIS — I152 Hypertension secondary to endocrine disorders: Secondary | ICD-10-CM | POA: Diagnosis not present

## 2021-02-02 DIAGNOSIS — Z7689 Persons encountering health services in other specified circumstances: Secondary | ICD-10-CM | POA: Diagnosis not present

## 2021-02-02 DIAGNOSIS — G8929 Other chronic pain: Secondary | ICD-10-CM

## 2021-02-02 DIAGNOSIS — E1169 Type 2 diabetes mellitus with other specified complication: Secondary | ICD-10-CM

## 2021-02-02 DIAGNOSIS — E1165 Type 2 diabetes mellitus with hyperglycemia: Secondary | ICD-10-CM

## 2021-02-02 DIAGNOSIS — G894 Chronic pain syndrome: Secondary | ICD-10-CM | POA: Diagnosis not present

## 2021-02-02 DIAGNOSIS — F1120 Opioid dependence, uncomplicated: Secondary | ICD-10-CM | POA: Diagnosis not present

## 2021-02-02 DIAGNOSIS — Z794 Long term (current) use of insulin: Secondary | ICD-10-CM

## 2021-02-02 NOTE — Assessment & Plan Note (Signed)
Diet and nutrition discussed.  Continue Ozempic, insulin, metformin, and Farxiga as directed by endocrinologist.

## 2021-02-02 NOTE — Patient Instructions (Signed)
Health Maintenance, Female Adopting a healthy lifestyle and getting preventive care are important in promoting health and wellness. Ask your health care provider about: The right schedule for you to have regular tests and exams. Things you can do on your own to prevent diseases and keep yourself healthy. What should I know about diet, weight, and exercise? Eat a healthy diet  Eat a diet that includes plenty of vegetables, fruits, low-fat dairy products, and lean protein. Do not eat a lot of foods that are high in solid fats, added sugars, or sodium.  Maintain a healthy weight Body mass index (BMI) is used to identify weight problems. It estimates body fat based on height and weight. Your health care provider can help determineyour BMI and help you achieve or maintain a healthy weight. Get regular exercise Get regular exercise. This is one of the most important things you can do for your health. Most adults should: Exercise for at least 150 minutes each week. The exercise should increase your heart rate and make you sweat (moderate-intensity exercise). Do strengthening exercises at least twice a week. This is in addition to the moderate-intensity exercise. Spend less time sitting. Even light physical activity can be beneficial. Watch cholesterol and blood lipids Have your blood tested for lipids and cholesterol at 60 years of age, then havethis test every 5 years. Have your cholesterol levels checked more often if: Your lipid or cholesterol levels are high. You are older than 60 years of age. You are at high risk for heart disease. What should I know about cancer screening? Depending on your health history and family history, you may need to have cancer screening at various ages. This may include screening for: Breast cancer. Cervical cancer. Colorectal cancer. Skin cancer. Lung cancer. What should I know about heart disease, diabetes, and high blood pressure? Blood pressure and heart  disease High blood pressure causes heart disease and increases the risk of stroke. This is more likely to develop in people who have high blood pressure readings, are of African descent, or are overweight. Have your blood pressure checked: Every 3-5 years if you are 18-39 years of age. Every year if you are 40 years old or older. Diabetes Have regular diabetes screenings. This checks your fasting blood sugar level. Have the screening done: Once every three years after age 40 if you are at a normal weight and have a low risk for diabetes. More often and at a younger age if you are overweight or have a high risk for diabetes. What should I know about preventing infection? Hepatitis B If you have a higher risk for hepatitis B, you should be screened for this virus. Talk with your health care provider to find out if you are at risk forhepatitis B infection. Hepatitis C Testing is recommended for: Everyone born from 1945 through 1965. Anyone with known risk factors for hepatitis C. Sexually transmitted infections (STIs) Get screened for STIs, including gonorrhea and chlamydia, if: You are sexually active and are younger than 60 years of age. You are older than 60 years of age and your health care provider tells you that you are at risk for this type of infection. Your sexual activity has changed since you were last screened, and you are at increased risk for chlamydia or gonorrhea. Ask your health care provider if you are at risk. Ask your health care provider about whether you are at high risk for HIV. Your health care provider may recommend a prescription medicine to help   prevent HIV infection. If you choose to take medicine to prevent HIV, you should first get tested for HIV. You should then be tested every 3 months for as long as you are taking the medicine. Pregnancy If you are about to stop having your period (premenopausal) and you may become pregnant, seek counseling before you get  pregnant. Take 400 to 800 micrograms (mcg) of folic acid every day if you become pregnant. Ask for birth control (contraception) if you want to prevent pregnancy. Osteoporosis and menopause Osteoporosis is a disease in which the bones lose minerals and strength with aging. This can result in bone fractures. If you are 65 years old or older, or if you are at risk for osteoporosis and fractures, ask your health care provider if you should: Be screened for bone loss. Take a calcium or vitamin D supplement to lower your risk of fractures. Be given hormone replacement therapy (HRT) to treat symptoms of menopause. Follow these instructions at home: Lifestyle Do not use any products that contain nicotine or tobacco, such as cigarettes, e-cigarettes, and chewing tobacco. If you need help quitting, ask your health care provider. Do not use street drugs. Do not share needles. Ask your health care provider for help if you need support or information about quitting drugs. Alcohol use Do not drink alcohol if: Your health care provider tells you not to drink. You are pregnant, may be pregnant, or are planning to become pregnant. If you drink alcohol: Limit how much you use to 0-1 drink a day. Limit intake if you are breastfeeding. Be aware of how much alcohol is in your drink. In the [place] one drink equals one 12 oz bottle of beer (355 mL), one 5 oz glass of wine (148 mL), or one 1 oz glass of hard liquor (44 mL). General instructions Schedule regular health, dental, and eye exams. Stay current with your vaccines. Tell your health care provider if: You often feel depressed. You have ever been abused or do not feel safe at home. Summary Adopting a healthy lifestyle and getting preventive care are important in promoting health and wellness. Follow your health care provider's instructions about healthy diet, exercising, and getting tested or screened for diseases. Follow your health care provider's  instructions on monitoring your cholesterol and blood pressure. This information is not intended to replace advice given to you by your health care provider. Make sure you discuss any questions you have with your healthcare provider. Document Revised: 05/23/2018 Document Reviewed: 05/23/2018 Elsevier Patient Education  2022 [employer]  

## 2021-02-02 NOTE — Assessment & Plan Note (Signed)
Opioid dependent.  Continue follow-up with pain management clinic.

## 2021-02-02 NOTE — Progress Notes (Signed)
[name] 60 y.o.   Chief Complaint  Patient presents with   Transitions Of Care    No concerns    HISTORY OF PRESENT ILLNESS: This is a 60 y.o. female first visit to this office, here to establish care with me. Has the following chronic medical problems: 1.  Diabetes: On weekly Ozempic, daily insulin, metformin and [place].  Sees endocrinologist on a regular basis, Dr.Shamleffer. 2.  Hypertension: On irbesartan and hydrochlorothiazide 3.  Asthma/COPD: On Symbicort twice a day.  Sees pulmonary doctor on a regular basis 4.  Chronic back pain on chronic opioids, sees pain management [facility], Dr. Hildred Alamin. 5.  Obstructive sleep apnea on CPAP therapy 6.  History of GERD 7.  HLD on rosuvastatin 10 mg daily Has no complaints or medical concerns today.  HPI   Prior to Admission medications   Medication Sig Start Date End Date Taking? Authorizing Provider  acyclovir (ZOVIRAX) 400 MG tablet TAKE 1 TABLET BY MOUTH THREE TIMES DAILY AS NEEDED (FOR  FLARES) 10/27/20  Yes Jose Persia, MD  albuterol (VENTOLIN HFA) 108 (90 Base) MCG/ACT inhaler Inhale 2 puffs into the lungs every 6 (six) hours as needed for wheezing or shortness of breath. 05/28/20  Yes Young, Tarri Fuller D, MD  aspirin 81 MG EC tablet Take 81 mg by mouth daily.   Yes [provider]  azithromycin (ZITHROMAX) 250 MG tablet 2 today then one daily 12/09/20  Yes Magdalen Spatz, NP  Blood Glucose Monitoring Suppl ([place]) w/Device KIT 1 each by Does not apply route 3 (three) times daily. 01/12/17  Yes Shela Leff, MD  budesonide-formoterol [facility]) 80-4.5 MCG/ACT inhaler Inhale 2 puffs then rinse mouth, twice daily- maintenance 05/28/20  Yes Young, Tarri Fuller D, MD  buprenorphine (BUTRANS) 5 MCG/HR PTWK 1 patch once a week. 04/17/20  Yes [provider]  chlorpheniramine (CHLOR-TRIMETON) 4 MG tablet Take 4 mg by mouth every 4 (four) hours as needed for allergies.   Yes [provider]  Continuous Blood Gluc Receiver (FREESTYLE LIBRE 2 READER SYSTM) DEVI 1 each by Does not apply route 4 (four) times daily. 03/14/19  Yes Aslam, Loralyn Freshwater, MD  Continuous Blood Gluc Sensor (FREESTYLE LIBRE 2 SENSOR) MISC USE TO CHECK BLOOD SUGARS FOUR TIMES DAILY. 12/31/20  Yes Shamleffer, Melanie Crazier, MD  Continuous Glucose Monitor DEVI Use as directed. 03/28/17  Yes Shela Leff, MD  dapagliflozin propanediol (FARXIGA) 10 MG TABS tablet Take 1 tablet (10 mg total) by mouth daily. 11/06/20  Yes Shamleffer, Melanie Crazier, MD  diclofenac Sodium (VOLTAREN) 1 % GEL Apply 2 g topically 4 (four) times daily. Rub into affected area of foot 2 to 4 times daily 10/10/19  Yes Trula Slade, DPM  dicyclomine (BENTYL) 10 MG capsule TAKE 1 CAPSULE BY MOUTH THREE TIMES DAILY BEFORE  MEALS. 01/15/21  Yes Ladene Artist, MD  esomeprazole (NEXIUM) 40 MG capsule Take 1 capsule (40 mg total) by mouth 2 (two) times daily before a meal. 12/18/19  Yes Lemmon, Lavone Nian, PA  ezetimibe (ZETIA) 10 MG tablet Take 1 tablet (10 mg total) by mouth daily. 09/22/20 09/22/21 Yes Aslam, Loralyn Freshwater, MD  GARLIC 7353 PO Take by mouth.   Yes [provider]  hydrochlorothiazide (HYDRODIURIL) 25 MG tablet Take 1 tablet by mouth once daily 04/13/20  Yes Jose Persia, MD  insulin aspart protamine - aspart (NOVOLOG 70/30 FLEXPEN RELION) (70-30) 100 UNIT/ML FlexPen Inject 80 units with Breakfast and 80 units with Supper 11/07/20  Yes  Shamleffer, Melanie Crazier, MD  Insulin Pen Needle 32G X 4 MM MISC Use to inject insulin two times a day 01/08/20  Yes Aslam, Sadia, MD  Insulin Syringe-Needle U-100 (INSULIN SYRINGE 1CC/30GX1/2") 30G X 1/2" 1 ML MISC Use to fill Vgo daily 11/06/18  Yes Dorrell, Andree Elk, MD  metFORMIN (GLUCOPHAGE) 1000 MG tablet Take 1 tablet (1,000 mg total) by mouth 2 (two) times daily with a meal. 01/03/20  Yes Shamleffer, Melanie Crazier, MD  methocarbamol (ROBAXIN) 500 MG tablet Take 1 tablet (500 mg total)  by mouth every 8 (eight) hours as needed for muscle spasms. 04/12/20  Yes Maudie Flakes, MD  montelukast (SINGULAIR) 10 MG tablet Take 1 tablet (10 mg total) by mouth at bedtime. 10/31/18  Yes Tanda Rockers, MD  Multiple Vitamin (MULTIVITAMIN WITH MINERALS) TABS tablet Take 1 tablet by mouth daily.   Yes [provider]  NARCAN 4 MG/0.1ML LIQD nasal spray kit USE 1 SPRAY IN THE NOSE AS NEEDED 12/06/18  Yes [provider]  nystatin (MYCOSTATIN) 100000 UNIT/ML suspension Use 4-6 cc four times daily and continue for 48 hours after symptoms clear 12/09/20  Yes Magdalen Spatz, NP  [facility] DELICA LANCETS FINE MISC Check blood sugar 3 times a day 06/22/16  Yes Shela Leff, MD  [facility] VERIO test strip  CHECK BLOOD SUGAR 3 TIMES A DAY 05/09/18  Yes Dorrell, Andree Elk, MD  oxyCODONE-acetaminophen (PERCOCET) 10-325 MG tablet Take 1 tablet by mouth 4 (four) times daily as needed. 03/02/20  Yes [provider]  rosuvastatin (CRESTOR) 10 MG tablet Take 1 tablet (10 mg total) by mouth daily. 11/27/20  Yes Shamleffer, Melanie Crazier, MD  Semaglutide, 2 MG/DOSE, (OZEMPIC, 2 MG/DOSE,) 8 MG/3ML SOPN Inject 2 mg into the skin once a week. 11/06/20  Yes Shamleffer, Melanie Crazier, MD  tiZANidine (ZANAFLEX) 4 MG capsule Take 4 mg by mouth 3 (three) times daily as needed. 09/06/19  Yes [provider]  valACYclovir (VALTREX) 500 MG tablet TAKE 1 TABLET BY MOUTH TWICE DAILY FOR 5 DAYS AT  TIME  OF  OUTBREAK 07/10/20  Yes Aslam, Sadia, MD  irbesartan (AVAPRO) 300 MG tablet Take 1 tablet (300 mg total) by mouth daily. 07/11/20 10/09/20  Harvie Heck, MD    Allergies  Allergen Reactions   Lisinopril Cough    Patient Active Problem List   Diagnosis Date Noted   Pre-operative respiratory examination 09/04/2020   Elevated CK 07/11/2020   Dyspnea on exertion 07/11/2020   Fracture of base of fifth metatarsal bone of right foot at metaphyseal-diaphyseal junction with nonunion  05/07/2019   OSA on CPAP 10/31/2018   Stress incontinence in female 03/09/2017   Hepatic steatosis 08/29/2016   Herpes simplex 03/08/2016   Diabetic neuropathy with neurologic complication ([place]) 49/44/9675   Long term current use of opiate analgesic 07/21/2015   Preventative health care 05/21/2015   COPD GOLD II  05/31/2013   Lung nodule < 6cm on CT 05/31/2013   Chronic cough 12/07/2012   Chronic pain syndrome 02/13/2012   Lumbar radiculopathy 11/29/2011   Type 2 diabetes mellitus with diabetic neuropathy ([place]) 08/25/2011   Back pain 11/15/2010   Essential hypertension 07/07/2009   GERD 01/08/2009   Hyperlipidemia 12/09/2008   Morbid obesity due to excess calories ([place]) 12/09/2008    Past Medical History:  Diagnosis Date   Allergy    Arthritis    back    Asthma    AS CHILD   Chronic back pain  Chronic leg pain    due to back pain   Cocaine abuse (HCC)    in remission   Diabetes ([place])    with neuropathy   GERD (gastroesophageal reflux disease)    Hyperlipidemia    Hypertension    Intraductal papilloma of left breast 02/18/2016   Neuromuscular disorder (HCC)    neuropathy   Postlaminectomy syndrome of lumbar region 12/07/2011   RECTAL BLEEDING 12/09/2008   Annotation: s/p EGD 7/08 mild gastritis, s/p colonoscopy 7/08- benign polyp  s/p polypectomy and isolated diverticulum.  Qualifier: Diagnosis of  By: Ditzler RN, Debra     Sciatica    per patient    Sleep apnea    CPAP    YEARS AGO DONE 1/2 YEARS AGO AND WAS TOLD DID NOT HAVE   Tobacco abuse    Uterine fibroid    s/p hysterectomy    Past Surgical History:  Procedure Laterality Date   BACK SURGERY  2012   L5-S1 microendoscopic disectomy last surgery 06/2011   BREAST BIOPSY     LEFT    01/19/16   BREAST EXCISIONAL BIOPSY Left 02/2016   BREAST REDUCTION SURGERY  1982   CATARACT EXTRACTION Right 11/07/2019   COLONOSCOPY     HAND SURGERY     MIDDLE TRIGGER FINGER RIGHT SIDE   RADIOACTIVE SEED GUIDED EXCISIONAL  BREAST BIOPSY Left 02/18/2016   Procedure: LEFT RADIOACTIVE SEED GUIDED EXCISIONAL BREAST BIOPSY;  Surgeon: Alphonsa Overall, MD;  Location: [facility];  Service: General;  Laterality: Left;   REDUCTION MAMMAPLASTY Bilateral    TONSILLECTOMY  2008   TOTAL ABDOMINAL HYSTERECTOMY  05/24/2007   hysterectomy   TUBAL LIGATION     UPPER GASTROINTESTINAL ENDOSCOPY      Social History   Socioeconomic History   Marital status: Married    Spouse name: Not on file   Number of children: 3   Years of education: Not on file   Highest education level: Not on file  Occupational History   Occupation: Pare Professional/Admin Asst.    Employer: [employer]  Tobacco Use   Smoking status: Former    Packs/day: 0.50    Years: 20.00    Pack years: 10.00    Types: Cigarettes    Quit date: 07/23/1996    Years since quitting: 24.5   Smokeless tobacco: Never  Vaping Use   Vaping Use: Never used  Substance and Sexual Activity   Alcohol use: No    Alcohol/week: 0.0 standard drinks    Comment: recovering addict clean for 9 years   Drug use: No    Types: Cocaine, Marijuana    Comment: recovering addict clean for 9 years   Sexual activity: Yes    Birth control/protection: Surgical  Other Topics Concern   Not on file  Social History Narrative   Current Social History 01/17/2020        Patient lives with spouse in a home which is 1 story. There are not steps up to the entrance the patient uses.       Patient's method of transportation is personal car.      The highest level of education was some college.      The patient currently works part-time.      Identified important Relationships are God, husband, kids, grandkids       Pets : None       Interests / Fun: Drawing and nature       Current Stressors: Pain, Covid  Religious / Personal Beliefs: Christian       Other: None    Social Determinants of Radio broadcast assistant Strain: Not on file  Food Insecurity: Not on file   Transportation Needs: Not on file  Physical Activity: Not on file  Stress: Not on file  Social Connections: Not on file  Intimate Partner Violence: Not on file    Family History  Problem Relation Age of Onset   Diabetes Father    Heart disease Father    Hypertension Father    Diabetes Mother    Cancer Mother        brain   Hypertension Mother    Kidney disease Sister    Diabetes Sister    Kidney cancer Sister    Stroke Sister    Diabetes Sister    Hypertension Sister    Diabetes Sister    Hypertension Sister    Obesity Son    Colon cancer Neg Hx    Colon polyps Neg Hx    Esophageal cancer Neg Hx    Rectal cancer Neg Hx    Stomach cancer Neg Hx      Review of Systems  Constitutional: Negative.  Negative for chills and fever.  HENT: Negative.  Negative for congestion and sore throat.   Respiratory: Negative.  Negative for cough and shortness of breath.   Cardiovascular: Negative.  Negative for chest pain and palpitations.  Gastrointestinal:  Negative for abdominal pain, diarrhea, nausea and vomiting.  Genitourinary: Negative.  Negative for dysuria.  Musculoskeletal:  Positive for back pain.  Skin: Negative.  Negative for rash.  Neurological:  Negative for dizziness and headaches.  All other systems reviewed and are negative.  Today's Vitals   02/02/21 1019  BP: 136/80  Pulse: 85  Temp: 98.9 F (37.2 C)  TempSrc: Oral  SpO2: 90%  Weight: 245 lb (111.1 kg)  Height: _0  (1.626 m)   Body mass index is 42.05 kg/m.  Physical Exam Vitals reviewed.  Constitutional:      Appearance: She is obese.  HENT:     Head: Normocephalic.  Eyes:     Extraocular Movements: Extraocular movements intact.     Pupils: Pupils are equal, round, and reactive to light.  Cardiovascular:     Rate and Rhythm: Normal rate.  Pulmonary:     Effort: Pulmonary effort is normal.  Musculoskeletal:        General: Normal range of motion.     Cervical back: Normal range of motion.   Skin:    General: Skin is warm and dry.  Neurological:     General: No focal deficit present.     Mental Status: She is alert and oriented to person, place, and time.  Psychiatric:        Mood and Affect: Mood normal.        Behavior: Behavior normal.     ASSESSMENT & PLAN: Arraya was seen today for transitions of care.  Diagnoses and all orders for this visit:  Hypertension associated with diabetes ([place])  Need for Tdap vaccination -     Tdap vaccine greater than or equal to 7yo IM  Type 2 diabetes mellitus with hyperglycemia, with long-term current use of insulin ([place])  Dyslipidemia associated with type 2 diabetes mellitus (HCC)  Chronic low back pain without sciatica, unspecified back pain laterality  Uncomplicated opioid dependence ([place])  COPD GOLD II   Long term current use of opiate analgesic  OSA on CPAP  Chronic pain syndrome  Morbid obesity due to excess calories ([place])  Encounter to establish care  COPD GOLD II  Stable.  Continue Symbicort.  Continue follow-ups with pulmonary doctor.  OSA on CPAP Stable on CPAP treatment.  Dyslipidemia associated with type 2 diabetes mellitus ([place]) Diet and nutrition discussed.  Continue Ozempic, insulin, metformin, and Farxiga as directed by endocrinologist.  Chronic pain syndrome Opioid dependent.  Continue follow-up with pain management clinic.  Hypertension associated with diabetes ([place]) Well-controlled hypertension on irbesartan and hydrochlorothiazide. Dietary approaches to stop hypertension discussed. Advised to decrease amount of daily carbohydrate intake.  Patient Instructions  Health Maintenance, Female Adopting a healthy lifestyle and getting preventive care are important in promoting health and wellness. Ask your health care provider about: The right schedule for you to have regular tests and exams. Things you can do on your own to prevent diseases and keep yourself healthy. What should I know about  diet, weight, and exercise? Eat a healthy diet  Eat a diet that includes plenty of vegetables, fruits, low-fat dairy products, and lean protein. Do not eat a lot of foods that are high in solid fats, added sugars, or sodium.  Maintain a healthy weight Body mass index (BMI) is used to identify weight problems. It estimates body fat based on height and weight. Your health care provider can help determineyour BMI and help you achieve or maintain a healthy weight. Get regular exercise Get regular exercise. This is one of the most important things you can do for your health. Most adults should: Exercise for at least 150 minutes each week. The exercise should increase your heart rate and make you sweat (moderate-intensity exercise). Do strengthening exercises at least twice a week. This is in addition to the moderate-intensity exercise. Spend less time sitting. Even light physical activity can be beneficial. Watch cholesterol and blood lipids Have your blood tested for lipids and cholesterol at 60 years of age, then havethis test every 5 years. Have your cholesterol levels checked more often if: Your lipid or cholesterol levels are high. You are older than 60 years of age. You are at high risk for heart disease. What should I know about cancer screening? Depending on your health history and family history, you may need to have cancer screening at various ages. This may include screening for: Breast cancer. Cervical cancer. Colorectal cancer. Skin cancer. Lung cancer. What should I know about heart disease, diabetes, and high blood pressure? Blood pressure and heart disease High blood pressure causes heart disease and increases the risk of stroke. This is more likely to develop in people who have high blood pressure readings, are of African descent, or are overweight. Have your blood pressure checked: Every 3-5 years if you are 73-60 years of age. Every year if you are 55 years old or  older. Diabetes Have regular diabetes screenings. This checks your fasting blood sugar level. Have the screening done: Once every three years after age 46 if you are at a normal weight and have a low risk for diabetes. More often and at a younger age if you are overweight or have a high risk for diabetes. What should I know about preventing infection? Hepatitis B If you have a higher risk for hepatitis B, you should be screened for this virus. Talk with your health care provider to find out if you are at risk forhepatitis B infection. Hepatitis C Testing is recommended for: Everyone born from [birth year] through 1965. Anyone with known risk factors for  hepatitis C. Sexually transmitted infections (STIs) Get screened for STIs, including gonorrhea and chlamydia, if: You are sexually active and are younger than 60 years of age. You are older than 60 years of age and your health care provider tells you that you are at risk for this type of infection. Your sexual activity has changed since you were last screened, and you are at increased risk for chlamydia or gonorrhea. Ask your health care provider if you are at risk. Ask your health care provider about whether you are at high risk for HIV. Your health care provider may recommend a prescription medicine to help prevent HIV infection. If you choose to take medicine to prevent HIV, you should first get tested for HIV. You should then be tested every 3 months for as long as you are taking the medicine. Pregnancy If you are about to stop having your period (premenopausal) and you may become pregnant, seek counseling before you get pregnant. Take 400 to 800 micrograms (mcg) of folic acid every day if you become pregnant. Ask for birth control (contraception) if you want to prevent pregnancy. Osteoporosis and menopause Osteoporosis is a disease in which the bones lose minerals and strength with aging. This can result in bone fractures. If you are 71 years old  or older, or if you are at risk for osteoporosis and fractures, ask your health care provider if you should: Be screened for bone loss. Take a calcium or vitamin D supplement to lower your risk of fractures. Be given hormone replacement therapy (HRT) to treat symptoms of menopause. Follow these instructions at home: Lifestyle Do not use any products that contain nicotine or tobacco, such as cigarettes, e-cigarettes, and chewing tobacco. If you need help quitting, ask your health care provider. Do not use street drugs. Do not share needles. Ask your health care provider for help if you need support or information about quitting drugs. Alcohol use Do not drink alcohol if: Your health care provider tells you not to drink. You are pregnant, may be pregnant, or are planning to become pregnant. If you drink alcohol: Limit how much you use to 0-1 drink a day. Limit intake if you are breastfeeding. Be aware of how much alcohol is in your drink. In the [place] one drink equals one 12 oz bottle of beer (355 mL), one 5 oz glass of wine (148 mL), or one 1 oz glass of hard liquor (44 mL). General instructions Schedule regular health, dental, and eye exams. Stay current with your vaccines. Tell your health care provider if: You often feel depressed. You have ever been abused or do not feel safe at home. Summary Adopting a healthy lifestyle and getting preventive care are important in promoting health and wellness. Follow your health care provider's instructions about healthy diet, exercising, and getting tested or screened for diseases. Follow your health care provider's instructions on monitoring your cholesterol and blood pressure. This information is not intended to replace advice given to you by your health care provider. Make sure you discuss any questions you have with your healthcare provider. Document Revised: 05/23/2018 Document Reviewed: 05/23/2018 Elsevier Patient Education  2022 [place], [place] [facility]

## 2021-02-02 NOTE — Assessment & Plan Note (Signed)
Stable.  Continue Symbicort.  Continue follow-ups with pulmonary doctor.

## 2021-02-02 NOTE — Assessment & Plan Note (Signed)
Stable on CPAP treatment. 

## 2021-02-02 NOTE — Assessment & Plan Note (Signed)
Well-controlled hypertension on irbesartan and hydrochlorothiazide. Dietary approaches to stop hypertension discussed. Advised to decrease amount of daily carbohydrate intake.

## 2021-02-05 ENCOUNTER — Ambulatory Visit (INDEPENDENT_AMBULATORY_CARE_PROVIDER_SITE_OTHER): Payer: PPO

## 2021-02-05 ENCOUNTER — Other Ambulatory Visit: Payer: Self-pay

## 2021-02-05 DIAGNOSIS — M2041 Other hammer toe(s) (acquired), right foot: Secondary | ICD-10-CM | POA: Diagnosis not present

## 2021-02-05 DIAGNOSIS — E1165 Type 2 diabetes mellitus with hyperglycemia: Secondary | ICD-10-CM | POA: Diagnosis not present

## 2021-02-05 DIAGNOSIS — M2042 Other hammer toe(s) (acquired), left foot: Secondary | ICD-10-CM | POA: Diagnosis not present

## 2021-02-05 NOTE — Progress Notes (Signed)
Patient in office today to pick-up diabetic shoes. Shoes were tried on with custom inserts and patient was satisfied with the fit and feel of the shoes. Patient was educated on the break-in process and return policy. Patient verbalized understanding. Advised patient to call the office with any questions, comments or concerns.

## 2021-02-08 ENCOUNTER — Other Ambulatory Visit: Payer: Self-pay | Admitting: Internal Medicine

## 2021-02-08 ENCOUNTER — Other Ambulatory Visit: Payer: Self-pay

## 2021-02-08 ENCOUNTER — Ambulatory Visit
Admission: RE | Admit: 2021-02-08 | Discharge: 2021-02-08 | Disposition: A | Discharge status: Home / Self Care | Payer: PPO | Source: Ambulatory Visit | Attending: Internal Medicine | Admitting: Internal Medicine

## 2021-02-08 DIAGNOSIS — R922 Inconclusive mammogram: Secondary | ICD-10-CM | POA: Diagnosis not present

## 2021-02-08 DIAGNOSIS — R928 Other abnormal and inconclusive findings on diagnostic imaging of breast: Secondary | ICD-10-CM

## 2021-02-09 ENCOUNTER — Other Ambulatory Visit: Payer: Self-pay | Admitting: Gastroenterology

## 2021-02-09 DIAGNOSIS — R1013 Epigastric pain: Secondary | ICD-10-CM

## 2021-02-09 DIAGNOSIS — K2970 Gastritis, unspecified, without bleeding: Secondary | ICD-10-CM

## 2021-02-12 ENCOUNTER — Other Ambulatory Visit: Payer: Self-pay | Admitting: Student

## 2021-02-12 ENCOUNTER — Other Ambulatory Visit: Payer: Self-pay | Admitting: Gastroenterology

## 2021-02-12 DIAGNOSIS — R1013 Epigastric pain: Secondary | ICD-10-CM

## 2021-02-12 DIAGNOSIS — K2970 Gastritis, unspecified, without bleeding: Secondary | ICD-10-CM

## 2021-02-12 DIAGNOSIS — E1140 Type 2 diabetes mellitus with diabetic neuropathy, unspecified: Secondary | ICD-10-CM

## 2021-02-12 DIAGNOSIS — Z794 Long term (current) use of insulin: Secondary | ICD-10-CM

## 2021-02-13 ENCOUNTER — Ambulatory Visit (INDEPENDENT_AMBULATORY_CARE_PROVIDER_SITE_OTHER): Payer: PPO

## 2021-02-13 DIAGNOSIS — Z0000 Encounter for general adult medical examination without abnormal findings: Secondary | ICD-10-CM | POA: Diagnosis not present

## 2021-02-13 NOTE — Progress Notes (Addendum)
Subjective:  I connected with  [name] on 02/13/21 by an audio only telemedicine application and verified that I am speaking with the correct person using two identifiers.   I discussed the limitations, risks, security and privacy concerns of performing an evaluation and management service by telephone and the availability of in person appointments. I also discussed with the patient that there may be a patient responsible charge related to this service. The patient expressed understanding and verbally consented to this telephonic visit.  Location of Patient: Home  Location of Provider: Office  List any persons and their role that are participating in the visit with the patient.  None  Review of Systems    Defer to PCP       Objective:    Today's Vitals   02/13/21 1102  PainSc: 10-Worst pain ever   There is no height or weight on file to calculate BMI.  Advanced Directives 02/13/2021 09/15/2020 08/07/2020 07/10/2020 04/12/2020 04/10/2020 01/17/2020  Does Patient Have a Medical Advance Directive? _0  Yes No  Type of Advance Directive - Healthcare Power of [place] - -  Does patient want to make changes to medical advance directive? No - Patient declined - - No - Patient declined - - -  Copy of [place] in Chart? - - No - copy requested No - copy requested - - -  Would patient like information on creating a medical advance directive? - - No - Patient declined No - Patient declined - - No - Patient declined    Current Medications (verified) Outpatient Encounter Medications as of 02/13/2021  Medication Sig   acyclovir (ZOVIRAX) 400 MG tablet TAKE 1 TABLET BY MOUTH THREE TIMES DAILY AS NEEDED (FOR  FLARES)   albuterol (VENTOLIN HFA) 108 (90 Base) MCG/ACT inhaler Inhale 2 puffs into the lungs every 6 (six) hours as needed for wheezing or shortness of breath.    azithromycin (ZITHROMAX) 250 MG tablet 2 today then one daily   Blood Glucose Monitoring Suppl ([place]) w/Device KIT 1 each by Does not apply route 3 (three) times daily.   budesonide-formoterol (SYMBICORT) 80-4.5 MCG/ACT inhaler Inhale 2 puffs then rinse mouth, twice daily- maintenance   buprenorphine (BUTRANS) 5 MCG/HR PTWK 1 patch once a week.   chlorpheniramine (CHLOR-TRIMETON) 4 MG tablet Take 4 mg by mouth every 4 (four) hours as needed for allergies.   Continuous Blood Gluc Receiver (FREESTYLE LIBRE 2 READER SYSTM) DEVI 1 each by Does not apply route 4 (four) times daily.   Continuous Blood Gluc Sensor (FREESTYLE LIBRE 2 SENSOR) MISC USE TO CHECK BLOOD SUGARS FOUR TIMES DAILY.   Continuous Glucose Monitor DEVI Use as directed.   dapagliflozin propanediol (FARXIGA) 10 MG TABS tablet Take 1 tablet (10 mg total) by mouth daily.   diclofenac Sodium (VOLTAREN) 1 % GEL Apply 2 g topically 4 (four) times daily. Rub into affected area of foot 2 to 4 times daily   dicyclomine (BENTYL) 10 MG capsule TAKE 1 CAPSULE BY MOUTH THREE TIMES DAILY BEFORE  MEALS.   esomeprazole (NEXIUM) 40 MG capsule Take 1 capsule (40 mg total) by mouth 2 (two) times daily before a meal.   ezetimibe (ZETIA) 10 MG tablet Take 1 tablet (10 mg total) by mouth daily.   GARLIC 8333 PO Take by mouth.   hydrochlorothiazide (HYDRODIURIL) 25 MG tablet Take 1 tablet by mouth  once daily   insulin aspart protamine - aspart (NOVOLOG 70/30 FLEXPEN RELION) (70-30) 100 UNIT/ML FlexPen Inject 80 units with Breakfast and 80 units with Supper   Insulin Pen Needle 32G X 4 MM MISC Use to inject insulin two times a day   Insulin Syringe-Needle U-100 (INSULIN SYRINGE 1CC/30GX1/2") 30G X 1/2" 1 ML MISC Use to fill Vgo daily   metFORMIN (GLUCOPHAGE) 1000 MG tablet Take 1 tablet (1,000 mg total) by mouth 2 (two) times daily with a meal.   methocarbamol (ROBAXIN) 500 MG tablet Take 1 tablet (500 mg total) by mouth every 8 (eight)  hours as needed for muscle spasms.   montelukast (SINGULAIR) 10 MG tablet Take 1 tablet (10 mg total) by mouth at bedtime.   Multiple Vitamin (MULTIVITAMIN WITH MINERALS) TABS tablet Take 1 tablet by mouth daily.   NARCAN 4 MG/0.1ML LIQD nasal spray kit USE 1 SPRAY IN THE NOSE AS NEEDED   nystatin (MYCOSTATIN) 100000 UNIT/ML suspension Use 4-6 cc four times daily and continue for 48 hours after symptoms clear   ONETOUCH DELICA LANCETS FINE MISC Check blood sugar 3 times a day   ONETOUCH VERIO test strip  CHECK BLOOD SUGAR 3 TIMES A DAY   oxyCODONE-acetaminophen (PERCOCET) 10-325 MG tablet Take 1 tablet by mouth 4 (four) times daily as needed.   rosuvastatin (CRESTOR) 10 MG tablet Take 1 tablet (10 mg total) by mouth daily.   Semaglutide, 2 MG/DOSE, (OZEMPIC, 2 MG/DOSE,) 8 MG/3ML SOPN Inject 2 mg into the skin once a week.   tiZANidine (ZANAFLEX) 4 MG capsule Take 4 mg by mouth 3 (three) times daily as needed.   valACYclovir (VALTREX) 500 MG tablet TAKE 1 TABLET BY MOUTH TWICE DAILY FOR 5 DAYS AT  TIME  OF  OUTBREAK   aspirin 81 MG EC tablet Take 81 mg by mouth daily. (Patient not taking: Reported on 02/13/2021)   irbesartan (AVAPRO) 300 MG tablet Take 1 tablet (300 mg total) by mouth daily.   Facility-Administered Encounter Medications as of 02/13/2021  Medication   0.9 %  sodium chloride infusion    Allergies (verified) Lisinopril   History: Past Medical History:  Diagnosis Date   Allergy    Arthritis    back    Asthma    AS CHILD   Chronic back pain    Chronic leg pain    due to back pain   Cocaine abuse ([place])    in remission   Diabetes ([place])    with neuropathy   GERD (gastroesophageal reflux disease)    Hyperlipidemia    Hypertension    Intraductal papilloma of left breast 02/18/2016   Neuromuscular disorder ([place])    neuropathy   Postlaminectomy syndrome of lumbar region 12/07/2011   RECTAL BLEEDING 12/09/2008   Annotation: s/p EGD 7/08 mild gastritis, s/p colonoscopy 7/08-  benign polyp  s/p polypectomy and isolated diverticulum.  Qualifier: Diagnosis of  By: Ditzler RN, Debra     Sciatica    per patient    Sleep apnea    CPAP    YEARS AGO DONE 1/2 YEARS AGO AND WAS TOLD DID NOT HAVE   Tobacco abuse    Uterine fibroid    s/p hysterectomy   Past Surgical History:  Procedure Laterality Date   BACK SURGERY  2012   L5-S1 microendoscopic disectomy last surgery 06/2011   BREAST BIOPSY     LEFT    01/19/16   BREAST EXCISIONAL BIOPSY Left 02/2016   BREAST REDUCTION SURGERY  1982   CATARACT EXTRACTION Right 11/07/2019   COLONOSCOPY     HAND SURGERY     MIDDLE TRIGGER FINGER RIGHT SIDE   RADIOACTIVE SEED GUIDED EXCISIONAL BREAST BIOPSY Left 02/18/2016   Procedure: LEFT RADIOACTIVE SEED GUIDED EXCISIONAL BREAST BIOPSY;  Surgeon: Alphonsa Overall, MD;  Location: [facility] [place];  Service: General;  Laterality: Left;   REDUCTION MAMMAPLASTY Bilateral    TONSILLECTOMY  2008   TOTAL ABDOMINAL HYSTERECTOMY  05/24/2007   hysterectomy   TUBAL LIGATION     UPPER GASTROINTESTINAL ENDOSCOPY     Family History  Problem Relation Age of Onset   Diabetes Father    Heart disease Father    Hypertension Father    Diabetes Mother    Cancer Mother        brain   Hypertension Mother    Kidney disease Sister    Diabetes Sister    Kidney cancer Sister    Stroke Sister    Diabetes Sister    Hypertension Sister    Diabetes Sister    Hypertension Sister    Obesity Son    Colon cancer Neg Hx    Colon polyps Neg Hx    Esophageal cancer Neg Hx    Rectal cancer Neg Hx    Stomach cancer Neg Hx    Social History   Socioeconomic History   Marital status: Married    Spouse name: Not on file   Number of children: 3   Years of education: Not on file   Highest education level: Not on file  Occupational History   Occupation: Pare Professional/Admin Asst.    Employer: [employer]  Tobacco Use   Smoking status: Former    Packs/day: 0.50    Years: 20.00    Pack years: 10.00     Types: Cigarettes    Quit date: 07/23/1996    Years since quitting: 24.5   Smokeless tobacco: Never  Vaping Use   Vaping Use: Never used  Substance and Sexual Activity   Alcohol use: No    Alcohol/week: 0.0 standard drinks    Comment: recovering addict clean for 9 years   Drug use: No    Types: Cocaine, Marijuana    Comment: recovering addict clean for 9 years   Sexual activity: Yes    Birth control/protection: Surgical  Other Topics Concern   Not on file  Social History Narrative   Current Social History 01/17/2020        Patient lives with spouse in a home which is 1 story. There are not steps up to the entrance the patient uses.       Patient's method of transportation is personal car.      The highest level of education was some college.      The patient currently works part-time.      Identified important Relationships are God, husband, kids, grandkids       Pets : None       Interests / Fun: Drawing and nature       Current Stressors: Pain, Covid       Religious / Personal Beliefs: Christian       Other: None    Social Determinants of Radio broadcast assistant Strain: Low Risk    Difficulty of Paying Living Expenses: Not hard at all  Food Insecurity: No Food Insecurity   Worried About Charity fundraiser in the Last Year: Never true   [place] in the  Last Year: Never true  Transportation Needs: No Transportation Needs   Lack of Transportation (Medical): No   Lack of Transportation (Non-Medical): No  Physical Activity: Insufficiently Active   Days of Exercise per Week: 3 days   Minutes of Exercise per Session: 10 min  Stress: No Stress Concern Present   Feeling of Stress : Not at all  Social Connections: Moderately Integrated   Frequency of Communication with Friends and Family: More than three times a week   Frequency of Social Gatherings with Friends and Family: Once a week   Attends Religious Services: More than 4 times per year   Active Member  of [employer] or Organizations: No   Attends Music therapist: Never   Marital Status: Married    Tobacco Counseling Counseling given: Not Answered   Clinical Intake:  Pre-visit preparation completed: Yes  Pain : 0-10 Pain Score: 10-Worst pain ever Pain Type: Chronic pain Pain Location: Buttocks Pain Orientation: Left Pain Radiating Towards: throbing pain from buttocks down towards her legs Pain Descriptors / Indicators: Throbbing Pain Onset: More than a month ago Pain Frequency: Several days a week Pain Relieving Factors: Pt rest, pain patch, Oxycodone, or walking around Effect of Pain on Daily Activities: Effects day to day activities  Pain Relieving Factors: Pt rest, pain patch, Oxycodone, or walking around  Diabetes: Yes CBG done?: No Did pt. bring in CBG monitor from home?: No  How often do you need to have someone help you when you read instructions, pamphlets, or other written materials from your doctor or pharmacy?: 1 - Never  Diabetic?Yes  Interpreter Needed?: No      Activities of Daily Living In your present state of health, do you have any difficulty performing the following activities: 02/13/2021 09/15/2020  Hearing? N N  Vision? N Y  Difficulty concentrating or making decisions? N N  Comment - -  Walking or climbing stairs? Y Y  Comment Due to chronic leg pain -  Dressing or bathing? N N  Comment - -  Doing errands, shopping? N [place] and eating ? N -  Using the Toilet? N -  In the past six months, have you accidently leaked urine? N -  Do you have problems with loss of bowel control? N -  Managing your Medications? N -  Managing your Finances? Y -  Housekeeping or managing your Housekeeping? N -  Some recent data might be hidden    Patient Care Team: Horald Pollen, MD as PCP - General (Internal Medicine) Shamleffer, Melanie Crazier, MD as Consulting Physician (Endocrinology) [facility] Deneise Lever, MD as Consulting Physician (Pulmonary Disease) Trula Slade, DPM as Consulting Physician (Podiatry) Ladene Artist, MD as Consulting Physician (Gastroenterology)  Indicate any recent Medical Services you may have received from other than [facility] providers in the past year (date may be approximate).     Assessment:   This is a routine wellness examination for [name].  Hearing/Vision screen No results found.  Dietary issues and exercise activities discussed:     Goals Addressed   None    Depression Screen PHQ 2/9 Scores 02/13/2021 09/15/2020 04/10/2020 01/17/2020 01/08/2020 05/14/2019 05/14/2019  PHQ - 2 Score 0 0 0 0 _0 PHQ- 9 Score - 0 0 2 - - 3  Exception Documentation Medical reason - - - - - -    Fall Risk Fall Risk  02/13/2021 09/15/2020 07/10/2020 04/10/2020  01/17/2020  Falls in the past year? _0 0 1  Number falls in past yr: _1 - 1  Injury with Fall? 0 0 0 - 0  Risk Factor Category  - - - - -  Comment - - - - -  Risk for fall due to : Impaired balance/gait;Other (Comment) Impaired balance/gait Impaired balance/gait - Impaired mobility  Risk for fall due to: Comment Due to chronic pain pt can sometimes lose balance and fall. - - - -  Follow up - - - - Falls evaluation completed    FALL RISK PREVENTION PERTAINING TO THE HOME:  Any stairs in or around the home? No  If so, are there any without handrails? No  Home free of loose throw rugs in walkways, pet beds, electrical cords, etc? No  Adequate lighting in your home to reduce risk of falls? Yes   ASSISTIVE DEVICES UTILIZED TO PREVENT FALLS:  Life alert? No  Use of a cane, walker or w/c? Yes  Grab bars in the bathroom? No  Shower chair or bench in shower? No  Elevated toilet seat or a handicapped toilet? Yes   TIMED UP AND GO:  Was the test performed?  N/A .  Length of time to ambulate 10 feet: N/A sec.   These questions cannot be answered via Telephone Encounter.  Cognitive  Function:     6CIT Screen 02/13/2021  What Year? 0 points  What month? 0 points  What time? 0 points  Count back from 20 0 points  Months in reverse 0 points  Repeat phrase 0 points  Total Score 0    Immunizations Immunization History  Administered Date(s) Administered   Influenza Split 03/11/2011, 03/02/2012   Influenza Whole 02/12/2010   Influenza,inj,Quad PF,6+ Mos 03/08/2013, 02/27/2014, 05/19/2015, 04/19/2016, 03/09/2017, 02/06/2018, 02/12/2019, 04/10/2020   PFIZER(Purple Top)SARS-COV-2 Vaccination 07/25/2019, 08/19/2019, 03/21/2020, 01/03/2021   PPD Test 10/04/2010, 07/11/2011, 12/05/2012   Pneumococcal Polysaccharide-23 01/13/2012, 07/18/2017   Tdap 11/18/2010, 02/02/2021    TDAP status: Up to date  Flu Vaccine status: Due, Education has been provided regarding the importance of this vaccine. Advised may receive this vaccine at local pharmacy or Health Dept. Aware to provide a copy of the vaccination record if obtained from local pharmacy or Health Dept. Verbalized acceptance and understanding.  Pneumococcal vaccine status: Up to date  Covid-19 vaccine status: Completed vaccines  Qualifies for Shingles Vaccine? No   Zostavax completed  N/A   Shingrix Completed?: No.    Education has been provided regarding the importance of this vaccine. Patient has been advised to call insurance company to determine out of pocket expense if they have not yet received this vaccine. Advised may also receive vaccine at local pharmacy or Health Dept. Verbalized acceptance and understanding.  Screening Tests Health Maintenance  Topic Date Due   HEMOGLOBIN A1C  12/15/2020   INFLUENZA VACCINE  01/11/2021   URINE MICROALBUMIN  03/05/2021 (Originally 02/12/2020)   Zoster Vaccines- Shingrix (1 of 2) 05/05/2021 (Originally 08/09/1979)   Pneumococcal Vaccine 67-41 Years old (3 - PCV) 02/02/2022 (Originally 07/18/2018)   COVID-19 Vaccine (5 - Booster for Pfizer series) 05/06/2021   OPHTHALMOLOGY EXAM   07/21/2021   FOOT EXAM  08/11/2021   LIPID PANEL  09/15/2021   COLONOSCOPY (Pts 45-50yr Insurance coverage will need to be confirmed)  10/14/2021   MAMMOGRAM  01/20/2023   TETANUS/TDAP  02/03/2031   PNEUMOCOCCAL POLYSACCHARIDE VACCINE AGE 68-64 HIGH RISK  Completed   Hepatitis C Screening  Completed   HIV Screening  Completed   HPV VACCINES  Aged Out    Health Maintenance  Health Maintenance Due  Topic Date Due   HEMOGLOBIN A1C  12/15/2020   INFLUENZA VACCINE  01/11/2021    Colorectal cancer screening: Type of screening: Colonoscopy. Completed Yes. Repeat every 5 years  Mammogram status: Completed 2. Repeat every year  Bone Density status: Ordered No. Pt provided with contact info and advised to call to schedule appt.  Lung Cancer Screening: (Low Dose CT Chest recommended if Age 31-80 years, 30 pack-year currently smoking OR have quit w/in 15years.) does not qualify.   Lung Cancer Screening Referral: No  Additional Screening:  Hepatitis C Screening: does not qualify; Completed 2010  Vision Screening: Recommended annual ophthalmology exams for early detection of glaucoma and other disorders of the eye. Is the patient up to date with their annual eye exam?  Yes  Who is the provider or what is the name of the office in which the patient attends annual eye exams? [facility] If pt is not established with a provider, would they like to be referred to a provider to establish care? No .   Dental Screening: Recommended annual dental exams for proper oral hygiene  Community Resource Referral / Chronic Care Management: CRR required this visit?  No   CCM required this visit?  No      Plan:     I have personally reviewed and noted the following in the patient's chart:   Medical and social history Use of alcohol, tobacco or illicit drugs  Current medications and supplements including opioid prescriptions.  Functional ability and status Nutritional status Physical  activity Advanced directives List of other physicians Hospitalizations, surgeries, and ER visits in previous 12 months Vitals Screenings to include cognitive, depression, and falls Referrals and appointments  In addition, I have reviewed and discussed with patient certain preventive protocols, quality metrics, and best practice recommendations. A written personalized care plan for preventive services as well as general preventive health recommendations were provided to patient.     Lauralyn Primes, RMA   02/13/2021   Nurse Notes: Non-Face to face 45 minute visit Encounter   [name] , Thank you for taking time to come for your Medicare Wellness Visit. I appreciate your ongoing commitment to your health goals. Please review the following plan we discussed and let me know if I can assist you in the future.   These are the goals we discussed:  Goals       Blood Pressure < 140/90      Exercise 3x per week (30 min per time) (pt-stated)      HEMOGLOBIN A1C < 7.0      LDL CALC < 100        This is a list of the screening recommended for you and due dates:  Health Maintenance  Topic Date Due   Hemoglobin A1C  12/15/2020   Flu Shot  01/11/2021   Urine Protein Check  03/05/2021*   Zoster (Shingles) Vaccine (1 of 2) 05/05/2021*   Pneumococcal Vaccination (3 - PCV) 02/02/2022*   COVID-19 Vaccine (5 - Booster for Pfizer series) 05/06/2021   Eye exam for diabetics  07/21/2021   Complete foot exam   08/11/2021   Lipid (cholesterol) test  09/15/2021   Colon Cancer Screening  10/14/2021   Mammogram  01/20/2023   Tetanus Vaccine  02/03/2031   Pneumococcal vaccine  Completed   Hepatitis C Screening: USPSTF Recommendation to screen -  Ages 104-79 yo.  Completed   HIV Screening  Completed   HPV Vaccine  Aged Out  *Topic was postponed. The date shown is not the original due date.     I have reviewed and agree with the above AWV documentation. Agustina Caroli, MD Patient: Home  Provider:  Office

## 2021-02-16 ENCOUNTER — Ambulatory Visit
Admission: RE | Admit: 2021-02-16 | Discharge: 2021-02-16 | Disposition: A | Discharge status: Home / Self Care | Payer: PPO | Source: Ambulatory Visit | Attending: Internal Medicine | Admitting: Internal Medicine

## 2021-02-16 ENCOUNTER — Other Ambulatory Visit: Payer: Self-pay

## 2021-02-16 ENCOUNTER — Ambulatory Visit: Payer: PPO | Admitting: Podiatry

## 2021-02-16 DIAGNOSIS — R928 Other abnormal and inconclusive findings on diagnostic imaging of breast: Secondary | ICD-10-CM

## 2021-02-16 DIAGNOSIS — N6311 Unspecified lump in the right breast, upper outer quadrant: Secondary | ICD-10-CM | POA: Diagnosis not present

## 2021-02-16 DIAGNOSIS — C50411 Malignant neoplasm of upper-outer quadrant of right female breast: Secondary | ICD-10-CM | POA: Diagnosis not present

## 2021-02-16 HISTORY — PX: BREAST BIOPSY: SHX20

## 2021-02-17 ENCOUNTER — Other Ambulatory Visit: Payer: Self-pay | Admitting: Internal Medicine

## 2021-02-18 ENCOUNTER — Other Ambulatory Visit: Payer: Self-pay | Admitting: Gastroenterology

## 2021-02-18 DIAGNOSIS — K2970 Gastritis, unspecified, without bleeding: Secondary | ICD-10-CM

## 2021-02-18 DIAGNOSIS — R1013 Epigastric pain: Secondary | ICD-10-CM

## 2021-02-19 ENCOUNTER — Encounter: Payer: Self-pay | Admitting: Internal Medicine

## 2021-02-19 ENCOUNTER — Ambulatory Visit (INDEPENDENT_AMBULATORY_CARE_PROVIDER_SITE_OTHER): Payer: PPO | Admitting: Internal Medicine

## 2021-02-19 ENCOUNTER — Other Ambulatory Visit: Payer: Self-pay

## 2021-02-19 ENCOUNTER — Telehealth: Payer: Self-pay | Admitting: Hematology and Oncology

## 2021-02-19 ENCOUNTER — Telehealth: Payer: Self-pay | Admitting: Hematology

## 2021-02-19 VITALS — BP 134/72 | HR 70 | Ht 64.0 in | Wt 241.6 lb

## 2021-02-19 DIAGNOSIS — E1165 Type 2 diabetes mellitus with hyperglycemia: Secondary | ICD-10-CM | POA: Diagnosis not present

## 2021-02-19 DIAGNOSIS — Z794 Long term (current) use of insulin: Secondary | ICD-10-CM

## 2021-02-19 DIAGNOSIS — E785 Hyperlipidemia, unspecified: Secondary | ICD-10-CM

## 2021-02-19 DIAGNOSIS — E1140 Type 2 diabetes mellitus with diabetic neuropathy, unspecified: Secondary | ICD-10-CM

## 2021-02-19 LAB — LIPID PANEL
Cholesterol: 131 mg/dL (ref 0–200)
HDL: 34.5 mg/dL — ABNORMAL LOW (ref >39.00)
LDL Cholesterol: 74 mg/dL (ref 0–99)
NonHDL: 96.06
Total CHOL/HDL Ratio: 4
Triglycerides: 111 mg/dL (ref 0.0–149.0)
VLDL: 22.2 mg/dL (ref 0.0–40.0)

## 2021-02-19 LAB — BASIC METABOLIC PANEL
BUN: 12 mg/dL (ref 6–23)
CO2: 31 mEq/L (ref 19–32)
Calcium: 9.6 mg/dL (ref 8.4–10.5)
Chloride: 98 mEq/L (ref 96–112)
Creatinine, Ser: 0.71 mg/dL (ref 0.40–1.20)
GFR: 92.34 mL/min (ref >60.00)
Glucose, Bld: 175 mg/dL — ABNORMAL HIGH (ref 70–99)
Potassium: 4 mEq/L (ref 3.5–5.1)
Sodium: 139 mEq/L (ref 135–145)

## 2021-02-19 LAB — POCT GLYCOSYLATED HEMOGLOBIN (HGB A1C): Hemoglobin A1C: 8.3 % — AB (ref 4.0–5.6)

## 2021-02-19 LAB — MICROALBUMIN / CREATININE URINE RATIO
Creatinine,U: 56 mg/dL
Microalb Creat Ratio: 1.2 mg/g (ref 0.0–30.0)
Microalb, Ur: <0.7 mg/dL (ref 0.0–1.9)

## 2021-02-19 MED ORDER — OMNIPOD DASH PODS (GEN 4) MISC: 1.0000 | 3 refills | Status: DC

## 2021-02-19 MED ORDER — OMNIPOD DASH INTRO (GEN 4) KIT: 1.0000 | PACK | 0 refills | Status: DC

## 2021-02-19 MED ORDER — NOVOLOG 70/30 FLEXPEN RELION (70-30) 100 UNIT/ML ~~LOC~~ SUPN: PEN_INJECTOR | SUBCUTANEOUS | 3 refills | Status: DC

## 2021-02-19 NOTE — Patient Instructions (Addendum)
-   Continue Metformin 1 tablet Twice daily  - Continue  Ozempic 2  mg weekly  - Increase Novolog Mix (70/30) 90  units with  breakfast and 89  units with Supper  - Continue  Farxiga 10 mg , 1 tablet with breakfast    - Check out the Omnipod Dash     HOW TO TREAT LOW BLOOD SUGARS (Blood sugar LESS THAN 70 MG/DL) Please follow the RULE OF 15 for the treatment of hypoglycemia treatment (when your (blood sugars are less than 70 mg/dL)   STEP 1: Take 15 grams of carbohydrates when your blood sugar is low, which includes:  3-4 GLUCOSE TABS  OR 3-4 OZ OF JUICE OR REGULAR SODA OR ONE TUBE OF GLUCOSE GEL    STEP 2: RECHECK blood sugar in 15 MINUTES STEP 3: If your blood sugar is still low at the 15 minute recheck --> then, go back to STEP 1 and treat AGAIN with another 15 grams of carbohydrates.

## 2021-02-19 NOTE — Progress Notes (Signed)
Name: [name]  Age/ Sex: 60 y.o., female   MRN/ DOB: [id number], 1961-01-09     PCP: Horald Pollen, MD   Reason for Endocrinology Evaluation: Type 2 Diabetes Mellitus  Initial Endocrine Consultative Visit: 03/18/2019    PATIENT IDENTIFIER: [name] is a 60 y.o. female with a past medical history of T2DM, HTN, OSA and dyslipidemia . The patient has followed with Endocrinology clinic since 03/18/2019 for consultative assistance with management of her diabetes.  DIABETIC HISTORY:  [name] was diagnosed with T2DM many years ago. Has been on Soliqua, Glipizide and V-Go was started in 2019.  Her hemoglobin A1c has ranged from 8.1% in 2016, peaking at 9.8% in 2020.  On her initial visit to our clinic, she was on V-Go 40 with Humulin U-500 , Metformin, and Ozempic with an A1c 9.7% . We stopped the V-Go  And the U-500 due to recurrent hypoglycemia . We started Novolog Mix and continued metformin and Ozempic.   SGLT-2 inhibitor started through PCP 03/2020  SUBJECTIVE:   During the last visit (11/06/2020): A1c 8.3% .  We increase Farxiga, continued Ozempic, metformin, and NovoLog Mix     Today (02/19/2021): [name] is here for a follow up on her diabetes care.  She checks her blood sugars multiple times daily, through freestyle libre. The patient has not  had hypoglycemic episodes since the last clinic visit.   Denies nausea or diarrhea     HOME DIABETES REGIMEN:  Metformin 1000 tablet Twice daily  Ozempic 2 mg weekly ( Fridays) Novolog Mix (70/30) 80 units twice daily- she is taking 86 units in the morning and 89 units at night  Farxiga 10 mg daily     CONTINUOUS GLUCOSE MONITORING RECORD INTERPRETATION    Dates of Recording:8/27-02/19/2021 Sensor description: Freestyle Libre  Results statistics:   CGM use % of time 52  Average and SD 183/21  Time in range  55 %  % Time Above 180 39  % Time above 250 6  % Time Below target 0    Glycemic patterns  summary: Hyperglycemia noted during the day, optimal at night  Hyperglycemic episodes post prandial   Hypoglycemic episodes occurred n/a Overnight periods: trends down      DIABETIC COMPLICATIONS: Microvascular complications:  Neuropathy  Denies: CKD, retinopathy  Last eye exam: Completed 07/21/2020   Macrovascular complications:    Denies: CAD, PVD, CVA    HISTORY:  Past Medical History:  Past Medical History:  Diagnosis Date   Allergy    Arthritis    back    Asthma    AS CHILD   Chronic back pain    Chronic leg pain    due to back pain   Cocaine abuse ([place])    in remission   Diabetes ([place])    with neuropathy   GERD (gastroesophageal reflux disease)    Hyperlipidemia    Hypertension    Intraductal papilloma of left breast 02/18/2016   Neuromuscular disorder ([place])    neuropathy   Postlaminectomy syndrome of lumbar region 12/07/2011   RECTAL BLEEDING 12/09/2008   Annotation: s/p EGD 7/08 mild gastritis, s/p colonoscopy 7/08- benign polyp  s/p polypectomy and isolated diverticulum.  Qualifier: Diagnosis of  By: Ditzler RN, Debra     Sciatica    per patient    Sleep apnea    CPAP    YEARS AGO DONE 1/2 YEARS AGO AND WAS TOLD DID NOT HAVE   Tobacco abuse  Uterine fibroid    s/p hysterectomy   Past Surgical History:  Past Surgical History:  Procedure Laterality Date   BACK SURGERY  2012   L5-S1 microendoscopic disectomy last surgery 06/2011   BREAST BIOPSY     LEFT    01/19/16   BREAST EXCISIONAL BIOPSY Left 02/2016   BREAST REDUCTION SURGERY  1982   CATARACT EXTRACTION Right 11/07/2019   COLONOSCOPY     HAND SURGERY     MIDDLE TRIGGER FINGER RIGHT SIDE   RADIOACTIVE SEED GUIDED EXCISIONAL BREAST BIOPSY Left 02/18/2016   Procedure: LEFT RADIOACTIVE SEED GUIDED EXCISIONAL BREAST BIOPSY;  Surgeon: Alphonsa Overall, MD;  Location: [facility];  Service: General;  Laterality: Left;   REDUCTION MAMMAPLASTY Bilateral    TONSILLECTOMY  2008   TOTAL ABDOMINAL HYSTERECTOMY   05/24/2007   hysterectomy   TUBAL LIGATION     UPPER GASTROINTESTINAL ENDOSCOPY     Social History:  reports that she quit smoking about 24 years ago. Her smoking use included cigarettes. She has a 10.00 pack-year smoking history. She has never used smokeless tobacco. She reports that she does not drink alcohol and does not use drugs. Family History:  Family History  Problem Relation Age of Onset   Diabetes Father    Heart disease Father    Hypertension Father    Diabetes Mother    Cancer Mother        brain   Hypertension Mother    Kidney disease Sister    Diabetes Sister    Kidney cancer Sister    Stroke Sister    Diabetes Sister    Hypertension Sister    Diabetes Sister    Hypertension Sister    Obesity Son    Colon cancer Neg Hx    Colon polyps Neg Hx    Esophageal cancer Neg Hx    Rectal cancer Neg Hx    Stomach cancer Neg Hx      HOME MEDICATIONS: Allergies as of 02/19/2021       Reactions   Lisinopril Cough        Medication List        Accurate as of February 19, 2021  1:32 PM. If you have any questions, ask your nurse or doctor.          acyclovir 400 MG tablet Commonly known as: ZOVIRAX TAKE 1 TABLET BY MOUTH THREE TIMES DAILY AS NEEDED (FOR  FLARES)   albuterol 108 (90 Base) MCG/ACT inhaler Commonly known as: VENTOLIN HFA Inhale 2 puffs into the lungs every 6 (six) hours as needed for wheezing or shortness of breath.   aspirin 81 MG EC tablet Take 81 mg by mouth daily.   azithromycin 250 MG tablet Commonly known as: ZITHROMAX 2 today then one daily   budesonide-formoterol 80-4.5 MCG/ACT inhaler Commonly known as: Symbicort Inhale 2 puffs then rinse mouth, twice daily- maintenance   buprenorphine 5 MCG/HR Ptwk Commonly known as: BUTRANS 1 patch once a week.   chlorpheniramine 4 MG tablet Commonly known as: CHLOR-TRIMETON Take 4 mg by mouth every 4 (four) hours as needed for allergies.   Continuous Glucose Monitor Devi Use as  directed.   dapagliflozin propanediol 10 MG Tabs tablet Commonly known as: Farxiga Take 1 tablet (10 mg total) by mouth daily.   diclofenac Sodium 1 % Gel Commonly known as: VOLTAREN Apply 2 g topically 4 (four) times daily. Rub into affected area of foot 2 to 4 times daily   dicyclomine 10 MG capsule  Commonly known as: BENTYL TAKE 1 CAPSULE BY MOUTH THREE TIMES DAILY BEFORE MEAL(S)   esomeprazole 40 MG capsule Commonly known as: NEXIUM Take 1 capsule (40 mg total) by mouth 2 (two) times daily before a meal.   ezetimibe 10 MG tablet Commonly known as: Zetia Take 1 tablet (10 mg total) by mouth daily.   FreeStyle [place] 2 Reader [employer] 1 each by Does not apply route 4 (four) times daily.   FreeStyle Libre 2 Sensor Misc USE TO CHECK BLOOD SUGARS FOUR TIMES DAILY.   GARLIC 8786 PO Take by mouth.   hydrochlorothiazide 25 MG tablet Commonly known as: HYDRODIURIL Take 1 tablet by mouth once daily   Insulin Pen Needle 32G X 4 MM Misc Use to inject insulin two times a day   INSULIN SYRINGE 1CC/30GX1/2" 30G X 1/2" 1 ML Misc Use to fill Vgo daily   irbesartan 300 MG tablet Commonly known as: Avapro Take 1 tablet (300 mg total) by mouth daily.   metFORMIN 1000 MG tablet Commonly known as: GLUCOPHAGE TAKE 1 TABLET BY MOUTH TWICE DAILY WITH  A  MEAL   methocarbamol 500 MG tablet Commonly known as: ROBAXIN Take 1 tablet (500 mg total) by mouth every 8 (eight) hours as needed for muscle spasms.   montelukast 10 MG tablet Commonly known as: SINGULAIR Take 1 tablet (10 mg total) by mouth at bedtime.   multivitamin with minerals Tabs tablet Take 1 tablet by mouth daily.   Narcan 4 MG/0.1ML Liqd nasal spray kit Generic drug: naloxone USE 1 SPRAY IN THE NOSE AS NEEDED   NovoLOG 70/30 FlexPen (70-30) 100 UNIT/ML FlexPen Generic drug: insulin aspart protamine - aspart Inject 80 units with Breakfast and 80 units with Supper   nystatin 100000 UNIT/ML  suspension Commonly known as: MYCOSTATIN Use 4-6 cc four times daily and continue for 48 hours after symptoms clear   Omnipod DASH Intro (Gen 4) Kit 1 Device by Does not apply route every 3 (three) days. Started by: Dorita Sciara, MD   Omnipod DASH Pods (Gen 4) Misc 1 Device by Does not apply route every 3 (three) days. Started by: Dorita Sciara, MD   OneTouch Delica Lancets Fine Misc Check blood sugar 3 times a day   OneTouch Verio Flex System w/Device Kit 1 each by Does not apply route 3 (three) times daily.   OneTouch Verio test strip Generic drug: glucose blood CHECK BLOOD SUGAR 3 TIMES A DAY   oxyCODONE-acetaminophen 10-325 MG tablet Commonly known as: PERCOCET Take 1 tablet by mouth 4 (four) times daily as needed.   Ozempic (2 MG/DOSE) 8 MG/3ML Sopn Generic drug: Semaglutide (2 MG/DOSE) Inject 2 mg into the skin once a week.   rosuvastatin 10 MG tablet Commonly known as: Crestor Take 1 tablet (10 mg total) by mouth daily.   tiZANidine 4 MG capsule Commonly known as: ZANAFLEX Take 4 mg by mouth 3 (three) times daily as needed.   valACYclovir 500 MG tablet Commonly known as: VALTREX TAKE 1 TABLET BY MOUTH TWICE DAILY FOR 5 DAYS AT  TIME  OF  OUTBREAK         OBJECTIVE:   Vital Signs: BP 134/72 (BP Location: Left Arm, Patient Position: Sitting, Cuff Size: Large)   Pulse 70   Ht 5' 4" (1.626 m)   Wt 241 lb 9.6 oz (109.6 kg)   BMI 41.47 kg/m   Wt Readings from Last 3 Encounters:  02/19/21 241 lb 9.6 oz (109.6 kg)  02/02/21 245 lb (111.1 kg)  11/26/20 243 lb 3.2 oz (110.3 kg)     Exam: General:  NAD  Lungs: Clear with good BS bilat with no rales, rhonchi, or wheezes  Heart: RRR with normal S1 and S2 and no gallops; no murmurs; no rub  Extremities: No pretibial edema.     DM foot exam: 07/06/2020  The skin of the feet is intact without sores or ulcerations. The pedal pulses are 2+ on right and 2+ on left. The sensation is intact to  a screening 5.07, 10 gram monofilament bilaterally          DATA REVIEWED:  Lab Results  Component Value Date   HGBA1C 8.3 (A) 02/19/2021   HGBA1C 8.4 (A) 09/15/2020   HGBA1C 8.3 (A) 07/06/2020   Lab Results  Component Value Date   MICROALBUR 0.99 09/30/2009   LDLCALC 202 (H) 09/15/2020   CREATININE 0.68 11/06/2020   Lab Results  Component Value Date   MICRALBCREAT 15 02/12/2019     Lab Results  Component Value Date   CHOL 285 (H) 09/15/2020   HDL 39 (L) 09/15/2020   LDLCALC 202 (H) 09/15/2020   TRIG 226 (H) 09/15/2020   CHOLHDL 7.3 (H) 09/15/2020        Results for KHYLA, MCCUMBERS (MRN [id number]) as of 02/19/2021 17:20  Ref. Range 02/19/2021 09:13  Sodium Latest Ref Range: 135 - 145 mEq/L 139  Potassium Latest Ref Range: 3.5 - 5.1 mEq/L 4.0  Chloride Latest Ref Range: 96 - 112 mEq/L 98  CO2 Latest Ref Range: 19 - 32 mEq/L 31  Glucose Latest Ref Range: 70 - 99 mg/dL 175 (H)  BUN Latest Ref Range: 6 - 23 mg/dL 12  Creatinine Latest Ref Range: 0.40 - 1.20 mg/dL 0.71  Calcium Latest Ref Range: 8.4 - 10.5 mg/dL 9.6  GFR Latest Ref Range: >60.00 mL/min 92.34  Total CHOL/HDL Ratio Unknown 4  Cholesterol Latest Ref Range: 0 - 200 mg/dL 131  HDL Cholesterol Latest Ref Range: >39.00 mg/dL 34.50 (L)  LDL (calc) Latest Ref Range: 0 - 99 mg/dL 74  MICROALB/CREAT RATIO Latest Ref Range: 0.0 - 30.0 mg/g 1.2  NonHDL Unknown 96.06  Triglycerides Latest Ref Range: 0.0 - 149.0 mg/dL 111.0  VLDL Latest Ref Range: 0.0 - 40.0 mg/dL 22.2  Creatinine,U Latest Units: mg/dL 56.0  Microalb, Ur Latest Ref Range: 0.0 - 1.9 mg/dL <0.7     ASSESSMENT / PLAN / RECOMMENDATIONS:   1) Type 2 Diabetes Mellitus, Poorly Controlled , With Neuropathic complications - Most recent A1c of 8.3  %. Goal A1c < 7.0 %.     - Her remains above goal. Last visit she told me she was taking 84 units of insulin and we reduced it to 80 units due to Ozempic increase but today she tells me she is taking 86 units  with breakfast and 89 units with Supper  - Today we discussed Omnipod Dash and she is interested. Sent to [facility]  - Pt will not count carbs and will have to give her a set dose of CHO to enter with meals. She will also needs U-500 as she is already using close to 200 units daily   MEDICATIONS: - Continue Metformin 1000 mg,  1 tablet Twice daily  - Continue Ozempic 2 mg weekly  - Increase Novolog Mix (70/30) 90 units with breakfast and 89 units with supper  - Continue Farxiga 10 mg daily    EDUCATION / INSTRUCTIONS: BG monitoring instructions: Patient  is instructed to check her blood sugars 2 times a day, fasting and bedtime . Call [facility] if: BG persistently < 70  I reviewed the Rule of 15 for the treatment of hypoglycemia in detail with the patient. Literature supplied.   2. Dyslipidemia :   She was on Atrovastatin at somepoint but developed leg pains and we switched to Rosuvastatin but her LDL has increased from 110 to 202 mg/dL in 09/2020 . - Lipids today is acceptable today   Medication  Continue rosuvastatin 10 mg daily     F/U in  months    Signed electronically by: Mack Guise, MD  [facility]  [facility] [address] [place], [place] [address] Phone: [phone] FAX: [phone]   CC: Horald Pollen, [place] [address] Phone: [phone]  Fax: [phone]  Return to Endocrinology clinic as below: Future Appointments  Date Time Provider [place]  03/02/2021 10:45 AM Trula Slade, DPM [facility]  04/14/2021  9:15 AM Bo Merino, MD [facility] None  06/25/2021  8:30 AM Shamleffer, Melanie Crazier, MD [facility] None  08/10/2021  8:40 AM Horald Pollen, MD [facility] None  11/26/2021  9:00 AM Deneise Lever, MD [facility] None

## 2021-02-19 NOTE — Telephone Encounter (Signed)
Patient called back and asked to change appointment time, patient will now come to the afternoon clinic, paperwork still being mailed to patient

## 2021-02-19 NOTE — Telephone Encounter (Signed)
Spoke to patient to confirm morning clinic appointment, packet will be mailed to patient

## 2021-02-22 ENCOUNTER — Encounter: Payer: Self-pay | Admitting: *Deleted

## 2021-02-22 DIAGNOSIS — Z170 Estrogen receptor positive status [ER+]: Secondary | ICD-10-CM

## 2021-02-22 DIAGNOSIS — C50411 Malignant neoplasm of upper-outer quadrant of right female breast: Secondary | ICD-10-CM

## 2021-02-23 ENCOUNTER — Telehealth: Payer: Self-pay | Admitting: Pharmacy Technician

## 2021-02-23 ENCOUNTER — Other Ambulatory Visit (HOSPITAL_COMMUNITY): Payer: Self-pay

## 2021-02-23 NOTE — Telephone Encounter (Signed)
Received Prior Authorization request from [facility] for OMNIPOD DASH PODS (GEN 4). No PRIOR AUTHORIZATION needed.

## 2021-02-23 NOTE — Progress Notes (Signed)
[place] NOTE  Patient Care Team: Horald Pollen, MD as PCP - General (Internal Medicine) Shamleffer, Melanie Crazier, MD as Consulting Physician (Endocrinology) [facility] Deneise Lever, MD as Consulting Physician (Pulmonary Disease) Trula Slade, DPM as Consulting Physician (Podiatry) Ladene Artist, MD as Consulting Physician (Gastroenterology) Jovita Kussmaul, MD as Consulting Physician (General Surgery) Nicholas Lose, MD as Consulting Physician (Hematology and Oncology) Kyung Rudd, MD as Consulting Physician (Radiation Oncology) Mauro Kaufmann, RN as Oncology Nurse Navigator Rockwell Germany, RN as Oncology Nurse Navigator  CHIEF COMPLAINTS/PURPOSE OF CONSULTATION:  Newly diagnosed right breast cancer  HISTORY OF PRESENTING ILLNESS:  [name] 60 y.o. female is here because of recent diagnosis of invasive ductal carcinoma of the right breast. Screening mammogram on 01/19/2021 showed possible asymmetry in the right breast. Diagnostic mammogram and [place] on 02/08/2021 showed 0.7 cm suspicious mass in the right breast at 10:00. Biopsy on 02/16/2021 showed invasive ductal carcinoma and DCIS ER+(>95%)/PR+(40%). She presents to the clinic today for initial evaluation and discussion of treatment options.   I reviewed her records extensively and collaborated the history with the patient.  SUMMARY OF ONCOLOGIC HISTORY: Oncology History  Malignant neoplasm of upper-outer quadrant of right breast in female, estrogen receptor positive ([place])  02/16/2021 Initial Diagnosis   Screening mammogram: possible asymmetry in the right breast. Diagnostic mammogram and [place]: 0.7 cm suspicious mass in the right breast at 10:00. 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%), HER2 negative by FISH, Ki-67 5%   02/24/2021 Cancer Staging   Staging form: Breast, AJCC 8th Edition - Clinical stage from 02/24/2021: Stage IA (cT1b, cN0, cM0, G1, ER+,  PR+, HER2-) - Signed by Nicholas Lose, MD on 02/24/2021 Stage prefix: Initial diagnosis Histologic grading system: 3 grade system     MEDICAL HISTORY:  Past Medical History:  Diagnosis Date   Allergy    Arthritis    back    Asthma    AS CHILD   Chronic back pain    Chronic leg pain    due to back pain   Cocaine abuse ([place])    in remission   Diabetes ([place])    with neuropathy   GERD (gastroesophageal reflux disease)    Hyperlipidemia    Hypertension    Intraductal papilloma of left breast 02/18/2016   Neuromuscular disorder (H[facility])    neuropathy   Postlaminectomy syndrome of lumbar region 12/07/2011   RECTAL BLEEDING 12/09/2008   Annotation: s/p EGD 7/08 mild gastritis, s/p colonoscopy 7/08- benign polyp  s/p polypectomy and isolated diverticulum.  Qualifier: Diagnosis of  By: Ditzler RN, Debra     Sciatica    per patient    Sleep apnea    CPAP    YEARS AGO DONE 1/2 YEARS AGO AND WAS TOLD DID NOT HAVE   Tobacco abuse    Uterine fibroid    s/p hysterectomy    SURGICAL HISTORY: Past Surgical History:  Procedure Laterality Date   BACK SURGERY  2012   L5-S1 microendoscopic disectomy last surgery 06/2011   BREAST BIOPSY     LEFT    01/19/16   BREAST EXCISIONAL BIOPSY Left 02/2016   BREAST REDUCTION SURGERY  1982   CATARACT EXTRACTION Right 11/07/2019   COLONOSCOPY     HAND SURGERY     MIDDLE TRIGGER FINGER RIGHT SIDE   RADIOACTIVE SEED GUIDED EXCISIONAL BREAST BIOPSY Left 02/18/2016   Procedure: LEFT RADIOACTIVE SEED GUIDED EXCISIONAL BREAST BIOPSY;  Surgeon: Alphonsa Overall, MD;  Location: [facility];  Service: General;  Laterality: Left;   REDUCTION MAMMAPLASTY Bilateral    TONSILLECTOMY  2008   TOTAL ABDOMINAL HYSTERECTOMY  05/24/2007   hysterectomy   TUBAL LIGATION     UPPER GASTROINTESTINAL ENDOSCOPY      SOCIAL HISTORY: Social History   Socioeconomic History   Marital status: Married    Spouse name: Not on file   Number of children: 3   Years of education: Not on file    Highest education level: Not on file  Occupational History   Occupation: Pare Professional/Admin Asst.    Employer: [employer]  Tobacco Use   Smoking status: Former    Packs/day: 0.50    Years: 20.00    Pack years: 10.00    Types: Cigarettes    Quit date: 07/23/1996    Years since quitting: 24.6   Smokeless tobacco: Never  Vaping Use   Vaping Use: Never used  Substance and Sexual Activity   Alcohol use: No    Alcohol/week: 0.0 standard drinks    Comment: recovering addict clean for 9 years   Drug use: No    Types: Cocaine, Marijuana    Comment: recovering addict clean for 9 years   Sexual activity: Yes    Birth control/protection: Surgical  Other Topics Concern   Not on file  Social History Narrative   Current Social History 01/17/2020        Patient lives with spouse in a home which is 1 story. There are not steps up to the entrance the patient uses.       Patient's method of transportation is personal car.      The highest level of education was some college.      The patient currently works part-time.      Identified important Relationships are God, husband, kids, grandkids       Pets : None       Interests / Fun: Drawing and nature       Current Stressors: Pain, Covid       Religious / Personal Beliefs: Christian       Other: None    Social Determinants of Radio broadcast assistant Strain: Low Risk    Difficulty of Paying Living Expenses: Not hard at all  Food Insecurity: No Food Insecurity   Worried About Charity fundraiser in the Last Year: Never true   Arboriculturist in the Last Year: Never true  Transportation Needs: No Transportation Needs   Lack of Transportation (Medical): No   Lack of Transportation (Non-Medical): No  Physical Activity: Insufficiently Active   Days of Exercise per Week: 3 days   Minutes of Exercise per Session: 10 min  Stress: No Stress Concern Present   Feeling of Stress : Not at all  Social Connections:  Moderately Integrated   Frequency of Communication with Friends and Family: More than three times a week   Frequency of Social Gatherings with Friends and Family: Once a week   Attends Religious Services: More than 4 times per year   Active Member of [employer] or Organizations: No   Attends Archivist Meetings: Never   Marital Status: Married  Human resources officer Violence: Not At Risk   Fear of Current or Ex-Partner: No   Emotionally Abused: No   Physically Abused: No   Sexually Abused: No    FAMILY HISTORY: Family History  Problem Relation Age of Onset  Diabetes Father    Heart disease Father    Hypertension Father    Diabetes Mother    Cancer Mother        brain   Hypertension Mother    Kidney disease Sister    Diabetes Sister    Kidney cancer Sister    Stroke Sister    Diabetes Sister    Hypertension Sister    Diabetes Sister    Hypertension Sister    Obesity Son    Colon cancer Neg Hx    Colon polyps Neg Hx    Esophageal cancer Neg Hx    Rectal cancer Neg Hx    Stomach cancer Neg Hx     ALLERGIES:  is allergic to lisinopril.  MEDICATIONS:  Current Outpatient Medications  Medication Sig Dispense Refill   acyclovir (ZOVIRAX) 400 MG tablet TAKE 1 TABLET BY MOUTH THREE TIMES DAILY AS NEEDED (FOR  FLARES) 90 tablet 0   albuterol (VENTOLIN HFA) 108 (90 Base) MCG/ACT inhaler Inhale 2 puffs into the lungs every 6 (six) hours as needed for wheezing or shortness of breath. 18 g 12   aspirin 81 MG EC tablet Take 81 mg by mouth daily. (Patient not taking: No sig reported)     azithromycin (ZITHROMAX) 250 MG tablet 2 today then one daily 6 tablet 0   Blood Glucose Monitoring Suppl (ONETOUCH VERIO FLEX SYSTEM) w/Device KIT 1 each by Does not apply route 3 (three) times daily. 1 kit 1   budesonide-formoterol (SYMBICORT) 80-4.5 MCG/ACT inhaler Inhale 2 puffs then rinse mouth, twice daily- maintenance 10.2 g 12   buprenorphine (BUTRANS) 5 MCG/HR PTWK 1 patch once a week.      chlorpheniramine (CHLOR-TRIMETON) 4 MG tablet Take 4 mg by mouth every 4 (four) hours as needed for allergies.     Continuous Blood Gluc Sensor (FREESTYLE LIBRE 2 SENSOR) MISC USE TO CHECK BLOOD SUGARS FOUR TIMES DAILY. 2 each 0   Continuous Glucose Monitor DEVI Use as directed. 1 each 0   dapagliflozin propanediol (FARXIGA) 10 MG TABS tablet Take 1 tablet (10 mg total) by mouth daily. 90 tablet 3   diclofenac Sodium (VOLTAREN) 1 % GEL Apply 2 g topically 4 (four) times daily. Rub into affected area of foot 2 to 4 times daily 100 g 2   dicyclomine (BENTYL) 10 MG capsule TAKE 1 CAPSULE BY MOUTH THREE TIMES DAILY BEFORE MEAL(S) 30 capsule 0   esomeprazole (NEXIUM) 40 MG capsule Take 1 capsule (40 mg total) by mouth 2 (two) times daily before a meal. 60 capsule 5   ezetimibe (ZETIA) 10 MG tablet Take 1 tablet (10 mg total) by mouth daily. 30 tablet 11   GARLIC 5701 PO Take by mouth.     hydrochlorothiazide (HYDRODIURIL) 25 MG tablet Take 1 tablet by mouth once daily 90 tablet 1   insulin aspart protamine - aspart (NOVOLOG 70/30 FLEXPEN) (70-30) 100 UNIT/ML FlexPen Inject 90 Units into the skin daily with breakfast AND 89 Units daily with supper. Inject 80 units with Breakfast and 80 units with Supper. 60 mL 3   Insulin Disposable Pump (OMNIPOD DASH INTRO, GEN 4,) KIT 1 Device by Does not apply route every 3 (three) days. 1 kit 0   Insulin Disposable Pump (OMNIPOD DASH PODS, GEN 4,) MISC 1 Device by Does not apply route every 3 (three) days. 6 each 3   Insulin Pen Needle 32G X 4 MM MISC Use to inject insulin two times a day 200 each 3  Insulin Syringe-Needle U-100 (INSULIN SYRINGE 1CC/30GX1/2") 30G X 1/2" 1 ML MISC Use to fill Vgo daily 100 each 5   irbesartan (AVAPRO) 300 MG tablet Take 1 tablet (300 mg total) by mouth daily. 90 tablet 0   metFORMIN (GLUCOPHAGE) 1000 MG tablet TAKE 1 TABLET BY MOUTH TWICE DAILY WITH  A  MEAL 180 tablet 0   methocarbamol (ROBAXIN) 500 MG tablet Take 1 tablet (500 mg  total) by mouth every 8 (eight) hours as needed for muscle spasms. 30 tablet 0   montelukast (SINGULAIR) 10 MG tablet Take 1 tablet (10 mg total) by mouth at bedtime. 30 tablet 11   Multiple Vitamin (MULTIVITAMIN WITH MINERALS) TABS tablet Take 1 tablet by mouth daily.     NARCAN 4 MG/0.1ML LIQD nasal spray kit USE 1 SPRAY IN THE NOSE AS NEEDED     nystatin (MYCOSTATIN) 100000 UNIT/ML suspension Use 4-6 cc four times daily and continue for 48 hours after symptoms clear 240 mL 0   ONETOUCH DELICA LANCETS FINE MISC Check blood sugar 3 times a day 100 each 12   ONETOUCH VERIO test strip  CHECK BLOOD SUGAR 3 TIMES A DAY 100 each 5   oxyCODONE-acetaminophen (PERCOCET) 10-325 MG tablet Take 1 tablet by mouth 4 (four) times daily as needed.     rosuvastatin (CRESTOR) 10 MG tablet Take 1 tablet (10 mg total) by mouth daily. 90 tablet 3   Semaglutide, 2 MG/DOSE, (OZEMPIC, 2 MG/DOSE,) 8 MG/3ML SOPN Inject 2 mg into the skin once a week. 9 mL 3   tiZANidine (ZANAFLEX) 4 MG capsule Take 4 mg by mouth 3 (three) times daily as needed.     valACYclovir (VALTREX) 500 MG tablet TAKE 1 TABLET BY MOUTH TWICE DAILY FOR 5 DAYS AT  TIME  OF  OUTBREAK 10 tablet 0   Current Facility-Administered Medications  Medication Dose Route Frequency Provider Last Rate Last Admin   0.9 %  sodium chloride infusion  500 mL Intravenous Once Ladene Artist, MD        REVIEW OF SYSTEMS:   Constitutional: Denies fevers, chills or abnormal night sweats Eyes: Denies blurriness of vision, double vision or watery eyes Ears, nose, mouth, throat, and face: Denies mucositis or sore throat Respiratory: Denies cough, dyspnea or wheezes Cardiovascular: Denies palpitation, chest discomfort or lower extremity swelling Gastrointestinal:  Denies nausea, heartburn or change in bowel habits Skin: Denies abnormal skin rashes Lymphatics: Denies new lymphadenopathy or easy bruising Neurological:Denies numbness, tingling or new  weaknesses Behavioral/Psych: Mood is stable, no new changes  Breast:  Denies any palpable lumps or discharge All other systems were reviewed with the patient and are negative.  PHYSICAL EXAMINATION: ECOG PERFORMANCE STATUS: 1 - Symptomatic but completely ambulatory  Vitals:   02/24/21 1244  BP: (!) 145/71  Pulse: 93  Resp: 18  Temp: 97.8 F (36.6 C)  SpO2: 100%   Filed Weights   02/24/21 1244  Weight: 241 lb 12.8 oz (109.7 kg)    LABORATORY DATA:  I have reviewed the data as listed Lab Results  Component Value Date   WBC 7.9 02/24/2021   HGB 15.2 (H) 02/24/2021   HCT 47.4 (H) 02/24/2021   MCV 85.1 02/24/2021   PLT 269 02/24/2021   Lab Results  Component Value Date   NA 138 02/24/2021   K 3.9 02/24/2021   CL 96 (L) 02/24/2021   CO2 27 02/24/2021    RADIOGRAPHIC STUDIES: I have personally reviewed the radiological reports and agreed with the  findings in the report.  ASSESSMENT AND PLAN:  Malignant neoplasm of upper-outer quadrant of right breast in female, estrogen receptor positive ([place]) 02/16/2021:Screening mammogram: possible asymmetry in the right breast. Diagnostic mammogram and [place]: 0.7 cm suspicious mass in the right breast at 10:00. 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%), HER2 negative by FISH, Ki-67 5%  Pathology and radiology counseling:Discussed with the patient, the details of pathology including the type of breast cancer,the clinical staging, the significance of ER, PR and HER-2/neu receptors and the implications for treatment. After reviewing the pathology in detail, we proceeded to discuss the different treatment options between surgery, radiation, chemotherapy, antiestrogen therapies.  Recommendations: 1. Breast conserving surgery followed by 2. Adjuvant radiation therapy followed by 3. Adjuvant antiestrogen therapy    Return to clinic after surgery to discuss final pathology report and then determine if Oncotype DX testing will need to be  sent.  All questions were answered. The patient knows to call the clinic with any problems, questions or concerns.   Rulon Eisenmenger, MD, MPH 02/24/2021    I, Thana Ates, am acting as scribe for Nicholas Lose, MD.  I have reviewed the above documentation for accuracy and completeness, and I agree with the above.

## 2021-02-24 ENCOUNTER — Encounter: Payer: Self-pay | Admitting: *Deleted

## 2021-02-24 ENCOUNTER — Encounter: Payer: Self-pay | Admitting: General Practice

## 2021-02-24 ENCOUNTER — Ambulatory Visit: Payer: Self-pay | Admitting: General Surgery

## 2021-02-24 ENCOUNTER — Ambulatory Visit
Admission: RE | Admit: 2021-02-24 | Discharge: 2021-02-24 | Disposition: A | Discharge status: Home / Self Care | Payer: PPO | Source: Ambulatory Visit | Attending: Radiation Oncology | Admitting: Radiation Oncology

## 2021-02-24 ENCOUNTER — Ambulatory Visit: Payer: PPO | Attending: General Surgery | Admitting: Physical Therapy

## 2021-02-24 ENCOUNTER — Other Ambulatory Visit: Payer: Self-pay

## 2021-02-24 ENCOUNTER — Inpatient Hospital Stay: Payer: PPO | Attending: Hematology and Oncology

## 2021-02-24 ENCOUNTER — Encounter: Payer: Self-pay | Admitting: Physical Therapy

## 2021-02-24 ENCOUNTER — Inpatient Hospital Stay (HOSPITAL_BASED_OUTPATIENT_CLINIC_OR_DEPARTMENT_OTHER): Payer: PPO | Admitting: Hematology and Oncology

## 2021-02-24 DIAGNOSIS — Z170 Estrogen receptor positive status [ER+]: Secondary | ICD-10-CM

## 2021-02-24 DIAGNOSIS — C50411 Malignant neoplasm of upper-outer quadrant of right female breast: Secondary | ICD-10-CM

## 2021-02-24 DIAGNOSIS — R293 Abnormal posture: Secondary | ICD-10-CM | POA: Diagnosis not present

## 2021-02-24 DIAGNOSIS — E119 Type 2 diabetes mellitus without complications: Secondary | ICD-10-CM | POA: Insufficient documentation

## 2021-02-24 LAB — CBC WITH DIFFERENTIAL (CANCER CENTER ONLY)
Abs Immature Granulocytes: 0.02 10*3/uL (ref 0.00–0.07)
Basophils Absolute: 0.1 10*3/uL (ref 0.0–0.1)
Basophils Relative: 1 %
Eosinophils Absolute: 0.2 10*3/uL (ref 0.0–0.5)
Eosinophils Relative: 3 %
HCT: 47.4 % — ABNORMAL HIGH (ref 36.0–46.0)
Hemoglobin: 15.2 g/dL — ABNORMAL HIGH (ref 12.0–15.0)
Immature Granulocytes: 0 %
Lymphocytes Relative: 51 %
Lymphs Abs: 4.1 10*3/uL — ABNORMAL HIGH (ref 0.7–4.0)
MCH: 27.3 pg (ref 26.0–34.0)
MCHC: 32.1 g/dL (ref 30.0–36.0)
MCV: 85.1 fL (ref 80.0–100.0)
Monocytes Absolute: 0.6 10*3/uL (ref 0.1–1.0)
Monocytes Relative: 8 %
Neutro Abs: 2.9 10*3/uL (ref 1.7–7.7)
Neutrophils Relative %: 37 %
Platelet Count: 269 10*3/uL (ref 150–400)
RBC: 5.57 MIL/uL — ABNORMAL HIGH (ref 3.87–5.11)
RDW: 14.1 % (ref 11.5–15.5)
WBC Count: 7.9 10*3/uL (ref 4.0–10.5)
nRBC: 0 % (ref 0.0–0.2)

## 2021-02-24 LAB — CMP (CANCER CENTER ONLY)
ALT: 30 U/L (ref 0–44)
AST: 27 U/L (ref 15–41)
Albumin: 4.3 g/dL (ref 3.5–5.0)
Alkaline Phosphatase: 63 U/L (ref 38–126)
Anion gap: 15 (ref 5–15)
BUN: 11 mg/dL (ref 6–20)
CO2: 27 mmol/L (ref 22–32)
Calcium: 10.3 mg/dL (ref 8.9–10.3)
Chloride: 96 mmol/L — ABNORMAL LOW (ref 98–111)
Creatinine: 0.96 mg/dL (ref 0.44–1.00)
GFR, Estimated: >60 mL/min (ref >60)
Glucose, Bld: 263 mg/dL — ABNORMAL HIGH (ref 70–99)
Potassium: 3.9 mmol/L (ref 3.5–5.1)
Sodium: 138 mmol/L (ref 135–145)
Total Bilirubin: 0.4 mg/dL (ref 0.3–1.2)
Total Protein: 8.3 g/dL — ABNORMAL HIGH (ref 6.5–8.1)

## 2021-02-24 LAB — GENETIC SCREENING ORDER

## 2021-02-24 NOTE — Therapy (Signed)
[place], [place], [address] Phone: [phone]   Fax:  [phone]  Physical Therapy Evaluation  Patient Details  Name: [name] MRN: [id number] Date of Birth: 03-Aug-1960 Referring Provider (PT): Dr. Autumn Messing   Encounter Date: 02/24/2021   PT End of Session - 02/24/21 1550     Visit Number 1    Number of Visits 2    Date for PT Re-Evaluation 04/21/21    PT Start Time 1400    PT Stop Time 1420   Also saw pt from 1520-1530 for a total of 30 minutes   PT Time Calculation (min) 20 min    Activity Tolerance Patient tolerated treatment well    Behavior During Therapy [facility] for tasks assessed/performed             Past Medical History:  Diagnosis Date   Allergy    Arthritis    back    Asthma    AS CHILD   Chronic back pain    Chronic leg pain    due to back pain   Cocaine abuse ([place])    in remission   Diabetes ([place])    with neuropathy   GERD (gastroesophageal reflux disease)    Hyperlipidemia    Hypertension    Intraductal papilloma of left breast 02/18/2016   Neuromuscular disorder ([place])    neuropathy   Postlaminectomy syndrome of lumbar region 12/07/2011   RECTAL BLEEDING 12/09/2008   Annotation: s/p EGD 7/08 mild gastritis, s/p colonoscopy 7/08- benign polyp  s/p polypectomy and isolated diverticulum.  Qualifier: Diagnosis of  By: Ditzler RN, Debra     Sciatica    per patient    Sleep apnea    CPAP    YEARS AGO DONE 1/2 YEARS AGO AND WAS TOLD DID NOT HAVE   Tobacco abuse    Uterine fibroid    s/p hysterectomy    Past Surgical History:  Procedure Laterality Date   BACK SURGERY  2012   L5-S1 microendoscopic disectomy last surgery 06/2011   BREAST BIOPSY     LEFT    01/19/16   BREAST EXCISIONAL BIOPSY Left 02/2016   BREAST REDUCTION SURGERY  1982   CATARACT EXTRACTION Right 11/07/2019   COLONOSCOPY     HAND SURGERY     MIDDLE TRIGGER FINGER RIGHT SIDE   RADIOACTIVE SEED  GUIDED EXCISIONAL BREAST BIOPSY Left 02/18/2016   Procedure: LEFT RADIOACTIVE SEED GUIDED EXCISIONAL BREAST BIOPSY;  Surgeon: Alphonsa Overall, MD;  Location: [facility];  Service: General;  Laterality: Left;   REDUCTION MAMMAPLASTY Bilateral    TONSILLECTOMY  2008   TOTAL ABDOMINAL HYSTERECTOMY  05/24/2007   hysterectomy   TUBAL LIGATION     UPPER GASTROINTESTINAL ENDOSCOPY      There were no vitals filed for this visit.    Subjective Assessment - 02/24/21 1434     Subjective Patient reports she is here tody to be seen by her medical team for her newly diagnosed right breast cancer.    Patient is accompained by: Family member    Pertinent History Patient was diagnosed on 01/19/2021 with right grade I invasive ductal carcinoma breast cancer. It measures 7 mm and is located in the uper outer quadrant. It is ER/PR positive and HER2 negative with a Ki67 of 5%. She had a bilateral breast reduction in 1982.    Patient Stated Goals Reduce lymphedema risk and learn post op shoulder ROM HEP    Currently in  Pain? Yes    Pain Score 9     Pain Location Back    Pain Orientation Left    Pain Descriptors / Indicators Aching;Constant    Pain Type Chronic pain    Pain Radiating Towards left hip and leg    Pain Onset 1 to 4 weeks ago    Pain Frequency Constant    Aggravating Factors  Unknown    Pain Relieving Factors unknown                [facility] PT Assessment - 02/24/21 0001       Assessment   Medical Diagnosis Right breast cancer    Referring Provider (PT) Dr. Autumn Messing    Onset Date/Surgical Date 01/19/21    Hand Dominance Right    Prior Therapy none      Precautions   Precautions Other (comment)    Precaution Comments active cancer      Restrictions   Weight Bearing Restrictions No      Balance Screen   Has the patient fallen in the past 6 months No    Has the patient had a decrease in activity level because of a fear of falling?  No    Is the patient reluctant to leave their home  because of a fear of falling?  No      Home Ecologist residence    Living Arrangements Spouse/significant other    Available Help at Discharge Family      Prior Function   Level of Independence Independent    Vocation Part time employment    Vocation Requirements admin asst    Leisure She does not exercise      Cognition   Overall Cognitive Status Within Functional Limits for tasks assessed      Posture/Postural Control   Posture/Postural Control Postural limitations    Postural Limitations Forward head;Rounded Shoulders      ROM / Strength   AROM / PROM / Strength AROM;Strength      AROM   Overall AROM Comments Cervical AROM is WNL    AROM Assessment Site Shoulder    Right/Left Shoulder Right;Left    Right Shoulder Extension 43 Degrees    Right Shoulder Flexion 130 Degrees    Right Shoulder ABduction 136 Degrees    Right Shoulder Internal Rotation 55 Degrees    Right Shoulder External Rotation 81 Degrees    Left Shoulder Extension 40 Degrees    Left Shoulder Flexion 147 Degrees    Left Shoulder ABduction 152 Degrees    Left Shoulder Internal Rotation 53 Degrees    Left Shoulder External Rotation 72 Degrees      Strength   Overall Strength Within functional limits for tasks performed               LYMPHEDEMA/ONCOLOGY QUESTIONNAIRE - 02/24/21 0001       Type   Cancer Type Right breast cancer      Lymphedema Assessments   Lymphedema Assessments Upper extremities      Right Upper Extremity Lymphedema   10 cm Proximal to Olecranon Process 36 cm    Olecranon Process 28.7 cm    10 cm Proximal to Ulnar Styloid Process 26.2 cm    Just Proximal to Ulnar Styloid Process 17.7 cm    Across Hand at [employer] 20.6 cm    At [place] of 2nd Digit 7 cm      Left Upper Extremity Lymphedema  10 cm Proximal to Olecranon Process 35.2 cm    Olecranon Process 28.8 cm    10 cm Proximal to Ulnar Styloid Process 24.6 cm    Just Proximal to  Ulnar Styloid Process 17.4 cm    Across Hand at [employer] 21 cm    At [place] of 2nd Digit 6.8 cm             L-DEX FLOWSHEETS - 02/24/21 1500       L-DEX LYMPHEDEMA SCREENING   Measurement Type Unilateral    L-DEX MEASUREMENT EXTREMITY Upper Extremity    POSITION  Standing    DOMINANT SIDE Right    At Risk Side Right    BASELINE SCORE (UNILATERAL) -0.9              The patient was assessed using the L-Dex machine today to produce a lymphedema index baseline score. The patient will be reassessed on a regular basis (typically every 3 months) to obtain new L-Dex scores. If the score is > 6.5 points away from his/her baseline score indicating onset of subclinical lymphedema, it will be recommended to wear a compression garment for 4 weeks, 12 hours per day and then be reassessed. If the score continues to be > 6.5 points from baseline at reassessment, we will initiate lymphedema treatment. Assessing in this manner has a 95% rate of preventing clinically significant lymphedema.       Objective measurements completed on examination: See above findings.         Patient was instructed today in a home exercise program today for post op shoulder range of motion. These included active assist shoulder flexion in sitting, scapular retraction, wall walking with shoulder abduction, and hands behind head external rotation.  She was encouraged to do these twice a day, holding 3 seconds and repeating 5 times when permitted by her physician.         PT Education - 02/24/21 1548     Education Details Lymphedema education and post op HEP    Person(s) Educated Patient    Methods Explanation;Demonstration;Handout    Comprehension Returned demonstration;Verbalized understanding                 PT Long Term Goals - 02/24/21 1557       PT LONG TERM GOAL #1   Title Patient will demonstrate she has regained full shoulder ROM and function post operatively compared to  baselines.    Time 8    Period Weeks    Status New    Target Date 04/21/21             Breast Clinic Goals - 02/24/21 1557       Patient will be able to verbalize understanding of pertinent lymphedema risk reduction practices relevant to her diagnosis specifically related to skin care.   Time 1    Period Days    Status Achieved      Patient will be able to return demonstrate and/or verbalize understanding of the post-op home exercise program related to regaining shoulder range of motion.   Time 1    Period Days    Status Achieved      Patient will be able to verbalize understanding of the importance of attending the postoperative After Breast Cancer Class for further lymphedema risk reduction education and therapeutic exercise.   Time 1    Period Days    Status Achieved  Plan - 02/24/21 1551     Clinical Impression Statement Patient was diagnosed on 01/19/2021 with right grade I invasive ductal carcinoma breast cancer. It measures 7 mm and is located in the uper outer quadrant. It is ER/PR positive and HER2 negative with a Ki67 of 5%. She had a bilateral breast reduction in 1982. Her multidisciplinary medical team met prior to her assessments to determine a recommended treatment plan. She is planning to have a right lumpectomy and sentinel node biopsy followed by an Oncotype test if the final tumor size is > 1 cm, radiation, and anti-estrogen therapy. She will benefit from a post op PT reassessment to determine needs and from L-Dex screens every 3 months for 2 years to detect subclinical lymphedema.    Stability/Clinical Decision Making Stable/Uncomplicated    Clinical Decision Making Low    Rehab Potential Excellent    PT Frequency --   Eval and 1 f/u visit   PT Treatment/Interventions ADLs/Self Care Home Management;Therapeutic exercise;Patient/family education    PT Next Visit Plan Will reassess 3-4 weeks post op    PT Home Exercise Plan Post op  shoulder ROM HEP    Consulted and Agree with Plan of Care Patient;Family member/caregiver    Family Member Consulted Husband             Patient will benefit from skilled therapeutic intervention in order to improve the following deficits and impairments:  Postural dysfunction, Decreased range of motion, Decreased knowledge of precautions, Impaired UE functional use, Pain  Visit Diagnosis: Malignant neoplasm of upper-outer quadrant of right breast in female, estrogen receptor positive ([place]) - Plan: PT plan of care cert/re-cert  Abnormal posture - Plan: PT plan of care cert/re-cert  Patient will follow up at outpatient cancer rehab 3-4 weeks following surgery.  If the patient requires physical therapy at that time, a specific plan will be dictated and sent to the referring physician for approval. The patient was educated today on appropriate basic range of motion exercises to begin post operatively and the importance of attending the After Breast Cancer class following surgery.  Patient was educated today on lymphedema risk reduction practices as it pertains to recommendations that will benefit the patient immediately following surgery.  She verbalized good understanding.      Problem List Patient Active Problem List   Diagnosis Date Noted   Malignant neoplasm of upper-outer quadrant of right breast in female, estrogen receptor positive ([place]) 02/22/2021   Pre-operative respiratory examination 09/04/2020   Elevated CK 07/11/2020   Dyspnea on exertion 07/11/2020   Fracture of base of fifth metatarsal bone of right foot at metaphyseal-diaphyseal junction with nonunion 05/07/2019   OSA on CPAP 10/31/2018   Stress incontinence in female 03/09/2017   Hepatic steatosis 08/29/2016   Herpes simplex 03/08/2016   Diabetic neuropathy with neurologic complication ([place]) 16/03/9603   Long term current use of opiate analgesic 07/21/2015   Preventative health care 05/21/2015   COPD GOLD II   05/31/2013   Lung nodule < 6cm on CT 05/31/2013   Chronic cough 12/07/2012   Chronic pain syndrome 02/13/2012   Lumbar radiculopathy 11/29/2011   Type 2 diabetes mellitus with diabetic neuropathy ([place]) 08/25/2011   Back pain 11/15/2010   Hypertension associated with diabetes ([place]) 07/07/2009   GERD 01/08/2009   Dyslipidemia associated with type 2 diabetes mellitus ([place]) 12/09/2008   Hyperlipidemia 12/09/2008   Morbid obesity due to excess calories ([place]) 12/09/2008   Annia Friendly, PT 02/24/21 4:03 PM  [place], [place], [address] Phone: [phone]   Fax:  [phone]  Name: [name] MRN: [id number] Date of Birth: 01-24-1961

## 2021-02-24 NOTE — Patient Instructions (Signed)

## 2021-02-24 NOTE — Progress Notes (Signed)
Radiation Oncology         [phone] ________________________________  Name: [name]        MRN: [id number]  Date of Service: 02/24/2021 DOB: Jul 12, 1960  [id number], Ines Bloomer, MD  Jovita Kussmaul, MD     REFERRING PHYSICIAN: Autumn Messing III, MD   DIAGNOSIS: The encounter diagnosis was Malignant neoplasm of upper-outer quadrant of right breast in female, estrogen receptor positive ([place]).   HISTORY OF PRESENT ILLNESS: [name] is a 60 y.o. female seen in the multidisciplinary breast clinic for a new diagnosis of right breast cancer. The patient was noted to have a right breast asymmetry that measured 7 mm on ultrasound and axilla was negative for adenopathy. 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 She's seen today to discuss treatment recommendations of her cancer.     PREVIOUS RADIATION THERAPY: No   PAST MEDICAL HISTORY:  Past Medical History:  Diagnosis Date   Allergy    Arthritis    back    Asthma    AS CHILD   Chronic back pain    Chronic leg pain    due to back pain   Cocaine abuse ([place])    in remission   Diabetes ([place])    with neuropathy   GERD (gastroesophageal reflux disease)    Hyperlipidemia    Hypertension    Intraductal papilloma of left breast 02/18/2016   Neuromuscular disorder ([place])    neuropathy   Postlaminectomy syndrome of lumbar region 12/07/2011   RECTAL BLEEDING 12/09/2008   Annotation: s/p EGD 7/08 mild gastritis, s/p colonoscopy 7/08- benign polyp  s/p polypectomy and isolated diverticulum.  Qualifier: Diagnosis of  By: Ditzler RN, Debra     Sciatica    per patient    Sleep apnea    CPAP    YEARS AGO DONE 1/2 YEARS AGO AND WAS TOLD DID NOT HAVE   Tobacco abuse    Uterine fibroid    s/p hysterectomy       PAST SURGICAL HISTORY: Past Surgical History:  Procedure Laterality Date   BACK SURGERY  2012   L5-S1 microendoscopic disectomy last  surgery 06/2011   BREAST BIOPSY     LEFT    01/19/16   BREAST EXCISIONAL BIOPSY Left 02/2016   BREAST REDUCTION SURGERY  1982   CATARACT EXTRACTION Right 11/07/2019   COLONOSCOPY     HAND SURGERY     MIDDLE TRIGGER FINGER RIGHT SIDE   RADIOACTIVE SEED GUIDED EXCISIONAL BREAST BIOPSY Left 02/18/2016   Procedure: LEFT RADIOACTIVE SEED GUIDED EXCISIONAL BREAST BIOPSY;  Surgeon: Alphonsa Overall, MD;  Location: [facility];  Service: General;  Laterality: Left;   REDUCTION MAMMAPLASTY Bilateral    TONSILLECTOMY  2008   TOTAL ABDOMINAL HYSTERECTOMY  05/24/2007   hysterectomy   TUBAL LIGATION     UPPER GASTROINTESTINAL ENDOSCOPY       FAMILY HISTORY:  Family History  Problem Relation Age of Onset   Diabetes Father    Heart disease Father    Hypertension Father    Diabetes Mother    Cancer Mother        brain   Hypertension Mother    Kidney disease Sister    Diabetes Sister    Kidney cancer Sister    Stroke Sister    Diabetes Sister    Hypertension Sister    Diabetes Sister    Hypertension  Sister    Obesity Son    Colon cancer Neg Hx    Colon polyps Neg Hx    Esophageal cancer Neg Hx    Rectal cancer Neg Hx    Stomach cancer Neg Hx      SOCIAL HISTORY:  reports that she quit smoking about 24 years ago. Her smoking use included cigarettes. She has a 10.00 pack-year smoking history. She has never used smokeless tobacco. She reports that she does not drink alcohol and does not use drugs. The patient is married and lives in [place]. 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 She's accompanied by her husband. They have 3 adult children and 6 grandchildren.   ALLERGIES: Lisinopril   MEDICATIONS:  Current Outpatient Medications  Medication Sig Dispense Refill   acyclovir (ZOVIRAX) 400 MG tablet TAKE 1 TABLET BY MOUTH THREE TIMES DAILY AS NEEDED (FOR  FLARES) 90 tablet 0   albuterol (VENTOLIN HFA) 108 (90 Base) MCG/ACT inhaler Inhale 2 puffs  into the lungs every 6 (six) hours as needed for wheezing or shortness of breath. 18 g 12   aspirin 81 MG EC tablet Take 81 mg by mouth daily. (Patient not taking: No sig reported)     azithromycin (ZITHROMAX) 250 MG tablet 2 today then one daily 6 tablet 0   Blood Glucose Monitoring Suppl (ONETOUCH VERIO FLEX SYSTEM) w/Device KIT 1 each by Does not apply route 3 (three) times daily. 1 kit 1   budesonide-formoterol (SYMBICORT) 80-4.5 MCG/ACT inhaler Inhale 2 puffs then rinse mouth, twice daily- maintenance 10.2 g 12   buprenorphine (BUTRANS) 5 MCG/HR PTWK 1 patch once a week.     chlorpheniramine (CHLOR-TRIMETON) 4 MG tablet Take 4 mg by mouth every 4 (four) hours as needed for allergies.     Continuous Blood Gluc Sensor (FREESTYLE LIBRE 2 SENSOR) MISC USE TO CHECK BLOOD SUGARS FOUR TIMES DAILY. 2 each 0   Continuous Glucose Monitor DEVI Use as directed. 1 each 0   dapagliflozin propanediol (FARXIGA) 10 MG TABS tablet Take 1 tablet (10 mg total) by mouth daily. 90 tablet 3   diclofenac Sodium (VOLTAREN) 1 % GEL Apply 2 g topically 4 (four) times daily. Rub into affected area of foot 2 to 4 times daily 100 g 2   dicyclomine (BENTYL) 10 MG capsule TAKE 1 CAPSULE BY MOUTH THREE TIMES DAILY BEFORE MEAL(S) 30 capsule 0   esomeprazole (NEXIUM) 40 MG capsule Take 1 capsule (40 mg total) by mouth 2 (two) times daily before a meal. 60 capsule 5   ezetimibe (ZETIA) 10 MG tablet Take 1 tablet (10 mg total) by mouth daily. 30 tablet 11   GARLIC 1500 PO Take by mouth.     hydrochlorothiazide (HYDRODIURIL) 25 MG tablet Take 1 tablet by mouth once daily 90 tablet 1   insulin aspart protamine - aspart (NOVOLOG 70/30 FLEXPEN) (70-30) 100 UNIT/ML FlexPen Inject 90 Units into the skin daily with breakfast AND 89 Units daily with supper. Inject 80 units with Breakfast and 80 units with Supper. 60 mL 3   Insulin Disposable Pump (OMNIPOD DASH INTRO, GEN 4,) KIT 1 Device by Does not apply route every 3 (three) days. 1 kit  0   Insulin Disposable Pump (OMNIPOD DASH PODS, GEN 4,) MISC 1 Device by Does not apply route every 3 (three) days. 6 each 3   Insulin Pen Needle 32G X 4 MM MISC Use to inject insulin two times a day 200 each 3     Insulin Syringe-Needle U-100 (INSULIN SYRINGE 1CC/30GX1/2") 30G X 1/2" 1 ML MISC Use to fill Vgo daily 100 each 5   irbesartan (AVAPRO) 300 MG tablet Take 1 tablet (300 mg total) by mouth daily. 90 tablet 0   metFORMIN (GLUCOPHAGE) 1000 MG tablet TAKE 1 TABLET BY MOUTH TWICE DAILY WITH  A  MEAL 180 tablet 0   methocarbamol (ROBAXIN) 500 MG tablet Take 1 tablet (500 mg total) by mouth every 8 (eight) hours as needed for muscle spasms. 30 tablet 0   montelukast (SINGULAIR) 10 MG tablet Take 1 tablet (10 mg total) by mouth at bedtime. 30 tablet 11   Multiple Vitamin (MULTIVITAMIN WITH MINERALS) TABS tablet Take 1 tablet by mouth daily.     NARCAN 4 MG/0.1ML LIQD nasal spray kit USE 1 SPRAY IN THE NOSE AS NEEDED     nystatin (MYCOSTATIN) 100000 UNIT/ML suspension Use 4-6 cc four times daily and continue for 48 hours after symptoms clear 240 mL 0   ONETOUCH DELICA LANCETS FINE MISC Check blood sugar 3 times a day 100 each 12   ONETOUCH VERIO test strip  CHECK BLOOD SUGAR 3 TIMES A DAY 100 each 5   oxyCODONE-acetaminophen (PERCOCET) 10-325 MG tablet Take 1 tablet by mouth 4 (four) times daily as needed.     rosuvastatin (CRESTOR) 10 MG tablet Take 1 tablet (10 mg total) by mouth daily. 90 tablet 3   Semaglutide, 2 MG/DOSE, (OZEMPIC, 2 MG/DOSE,) 8 MG/3ML SOPN Inject 2 mg into the skin once a week. 9 mL 3   tiZANidine (ZANAFLEX) 4 MG capsule Take 4 mg by mouth 3 (three) times daily as needed.     valACYclovir (VALTREX) 500 MG tablet TAKE 1 TABLET BY MOUTH TWICE DAILY FOR 5 DAYS AT  TIME  OF  OUTBREAK 10 tablet 0   Current Facility-Administered Medications  Medication Dose Route Frequency Provider Last Rate Last Admin   0.9 %  sodium chloride infusion  500 mL Intravenous Once Stark, Malcolm T,  MD         REVIEW OF SYSTEMS: On review of systems, the patient reports that she is doing well overall. No specific breast complaints were verbalized.      PHYSICAL EXAM:  Wt Readings from Last 3 Encounters:  02/24/21 241 lb 12.8 oz (109.7 kg)  02/19/21 241 lb 9.6 oz (109.6 kg)  02/02/21 245 lb (111.1 kg)   Temp Readings from Last 3 Encounters:  02/24/21 97.8 F (36.6 C) (Temporal)  02/02/21 98.9 F (37.2 C) (Oral)  11/26/20 97.6 F (36.4 C) (Temporal)   BP Readings from Last 3 Encounters:  02/24/21 (!) 145/71  02/19/21 134/72  02/02/21 136/80   Pulse Readings from Last 3 Encounters:  02/24/21 93  02/19/21 70  02/02/21 85    In general this is a well appearing African American female in no acute distress. She's alert and oriented x4 and appropriate throughout the examination. Cardiopulmonary assessment is negative for acute distress and she exhibits normal effort. Bilateral breast exam is deferred.    ECOG = 0  0 - Asymptomatic (Fully active, able to carry on all predisease activities without restriction)  1 - Symptomatic but completely ambulatory (Restricted in physically strenuous activity but ambulatory and able to carry out work of a light or sedentary nature. For example, light housework, office work)  2 - Symptomatic, <50% in bed during the day (Ambulatory and capable of all self care but unable to carry out any work activities. Up and about more   than 50% of waking hours)  3 - Symptomatic, >50% in bed, but not bedbound (Capable of only limited self-care, confined to bed or chair 50% or more of waking hours)  4 - Bedbound (Completely disabled. Cannot carry on any self-care. Totally confined to bed or chair)  5 - Death   Eustace Pen MM, Creech RH, Tormey DC, et al. [phone]). "Toxicity and response criteria of the [facility]". [place] Oncol. 5 (6): 649-55    LABORATORY DATA:  Lab Results  Component Value Date   WBC 7.9 02/24/2021   HGB  15.2 (H) 02/24/2021   HCT 47.4 (H) 02/24/2021   MCV 85.1 02/24/2021   PLT 269 02/24/2021   Lab Results  Component Value Date   NA 138 02/24/2021   K 3.9 02/24/2021   CL 96 (L) 02/24/2021   CO2 27 02/24/2021   Lab Results  Component Value Date   ALT 30 02/24/2021   AST 27 02/24/2021   ALKPHOS 63 02/24/2021   BILITOT 0.4 02/24/2021      RADIOGRAPHY: US BREAST LTD UNI RIGHT INC AXILLA  Result Date: 02/08/2021 CLINICAL DATA:  60 year old female presenting as a recall from screening for possible right breast mass. EXAM: DIGITAL DIAGNOSTIC UNILATERAL RIGHT MAMMOGRAM WITH TOMOSYNTHESIS AND CAD; ULTRASOUND RIGHT BREAST LIMITED TECHNIQUE: Right digital diagnostic mammography and breast tomosynthesis was performed. The images were evaluated with computer-aided detection.; Targeted ultrasound examination of the right breast was performed COMPARISON:  Previous exam(s). ACR Breast Density Category b: There are scattered areas of fibroglandular density. FINDINGS: Mammogram: Spot compression tomosynthesis cc and full field mL tomosynthesis views of the right breast were performed. There is persistence irregular mass with spiculation in the upper-outer quadrant the right breast measuring approximately 0.6 cm. Ultrasound: Targeted ultrasound performed in the right breast at 10 o'clock 8 cm from the nipple demonstrating an irregular hypoechoic mass with indistinct margins measuring 0.7 x 0.4 x 0.5 cm. This corresponds to the mammographic finding. Targeted ultrasound the right axilla demonstrates normal lymph nodes. IMPRESSION: Suspicious mass in the right breast at 10 o'clock measuring 0.7 cm. RECOMMENDATION: Ultrasound-guided core needle biopsy of the right breast mass at 10 o'clock. I have discussed the findings and recommendations with the patient. If applicable, a reminder letter will be sent to the patient regarding the next appointment. BI-RADS CATEGORY  4: Suspicious. Electronically Signed   By: Audie Pinto M.D.   On: 02/08/2021 09:35  MM DIAG BREAST TOMO UNI RIGHT  Result Date: 02/08/2021 CLINICAL DATA:  60 year old female presenting as a recall from screening for possible right breast mass. EXAM: DIGITAL DIAGNOSTIC UNILATERAL RIGHT MAMMOGRAM WITH TOMOSYNTHESIS AND CAD; ULTRASOUND RIGHT BREAST LIMITED TECHNIQUE: Right digital diagnostic mammography and breast tomosynthesis was performed. The images were evaluated with computer-aided detection.; Targeted ultrasound examination of the right breast was performed COMPARISON:  Previous exam(s). ACR Breast Density Category b: There are scattered areas of fibroglandular density. FINDINGS: Mammogram: Spot compression tomosynthesis cc and full field mL tomosynthesis views of the right breast were performed. There is persistence irregular mass with spiculation in the upper-outer quadrant the right breast measuring approximately 0.6 cm. Ultrasound: Targeted ultrasound performed in the right breast at 10 o'clock 8 cm from the nipple demonstrating an irregular hypoechoic mass with indistinct margins measuring 0.7 x 0.4 x 0.5 cm. This corresponds to the mammographic finding. Targeted ultrasound the right axilla demonstrates normal lymph nodes. IMPRESSION: Suspicious mass in the right breast at 10 o'clock measuring 0.7 cm. RECOMMENDATION: Ultrasound-guided core  needle biopsy of the right breast mass at 10 o'clock. I have discussed the findings and recommendations with the patient. If applicable, a reminder letter will be sent to the patient regarding the next appointment. BI-RADS CATEGORY  4: Suspicious. Electronically Signed   By: Audie Pinto M.D.   On: 02/08/2021 09:35  MM CLIP PLACEMENT RIGHT  Result Date: 02/16/2021 CLINICAL DATA:  Patient status post ultrasound-guided biopsy right breast mass. EXAM: 3D DIAGNOSTIC RIGHT MAMMOGRAM POST ULTRASOUND BIOPSY COMPARISON:  Previous exam(s). FINDINGS: 3D Mammographic images were obtained following ultrasound  guided biopsy of right breast mass. The biopsy marking clip is in expected position at the site of biopsy. IMPRESSION: Appropriate positioning of the ribbon shaped biopsy marking clip at the site of biopsy in the upper outer right breast. Final Assessment: Post Procedure Mammograms for Marker Placement Electronically Signed   By: Lovey Newcomer M.D.   On: 02/16/2021 09:24  [place] RT BREAST BX W LOC DEV 1ST LESION IMG BX SPEC US GUIDE  Addendum Date: 02/19/2021   ADDENDUM REPORT: 02/18/2021 12:03 ADDENDUM: Pathology revealed GRADE I INVASIVE DUCTAL CARCINOMA, DUCTAL CARCINOMA IN SITU of the RIGHT breast, upper outer, ribbon clip. This was found to be concordant by Dr. Lovey Newcomer. Pathology results were discussed with the patient by telephone. The patient reported doing well after the biopsy with tenderness at the site. Post biopsy instructions and care were reviewed and questions were answered. The patient was encouraged to call [facility] for any additional concerns. The patient was referred to [facility] on February 24, 2021. Pathology results reported by Stacie Acres RN on 02/18/2021. Electronically Signed   By: Lovey Newcomer M.D.   On: 02/18/2021 12:03   Result Date: 02/19/2021 CLINICAL DATA:  Patient with indeterminate right breast mass. EXAM: ULTRASOUND GUIDED RIGHT BREAST CORE NEEDLE BIOPSY COMPARISON:  Previous exam(s). PROCEDURE: I met with the patient and we discussed the procedure of ultrasound-guided biopsy, including benefits and alternatives. We discussed the high likelihood of a successful procedure. We discussed the risks of the procedure, including infection, bleeding, tissue injury, clip migration, and inadequate sampling. Informed written consent was given. The usual time-out protocol was performed immediately prior to the procedure. Lesion quadrant: Upper outer quadrant Using sterile technique and 1%  Lidocaine as local anesthetic, under direct ultrasound visualization, a 14 gauge spring-loaded device was used to perform biopsy of right breast mass 10 o'clock position using a lateral approach. At the conclusion of the procedure ribbon shaped tissue marker clip was deployed into the biopsy cavity. Follow up 2 view mammogram was performed and dictated separately. IMPRESSION: Ultrasound guided biopsy of right breast mass 10 o'clock position. No apparent complications. Electronically Signed: By: Lovey Newcomer M.D. On: 02/16/2021 09:23      IMPRESSION/PLAN: 1. Stage IA, [id number] grade 1, ER/PR positive invasive ductal carcinoma of the right breast. Dr. Lisbeth Renshaw discusses the pathology findings and reviews the nature of right breast disease. The consensus from the breast conference includes breast conservation with lumpectomy with  sentinel node biopsy. Depending on the size of the final tumor measurements rendered by pathology, the tumor may be tested for Oncotype Dx score to determine a role for systemic therapy. Provided that chemotherapy is not indicated, the patient's course would then be followed by external radiotherapy to the breast  to reduce risks of local recurrence followed by antiestrogen therapy. We discussed the risks, benefits, short, and long term  effects of radiotherapy, as well as the curative intent, and the patient is interested in proceeding. Dr. Moody discusses the delivery and logistics of radiotherapy and anticipates a course of 4 or up to 6 1/2 weeks of radiotherapy to the right breast, though are leaning toward 4 weeks based on her clinical work up to date. We will see her back a few weeks after surgery to discuss the simulation process and anticipate we starting radiotherapy about 4-6 weeks after surgery.    In a visit lasting 60 minutes, greater than 50% of the time was spent face to face reviewing her case, as well as in preparation of, discussing, and coordinating the patient's  care.  The above documentation reflects my direct findings during this shared patient visit. Please see the separate note by Dr. Moody on this date for the remainder of the patient's plan of care.    Alison C. Perkins, PAC    **Disclaimer: This note was dictated with voice recognition software. Similar sounding words can inadvertently be transcribed and this note may contain transcription errors which may not have been corrected upon publication of note.** 

## 2021-02-24 NOTE — Assessment & Plan Note (Signed)
02/16/2021:Screening mammogram: possible asymmetry in the right breast. Diagnostic mammogram and [place]: 0.7 cm suspicious mass in the right breast at 10:00. 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%), HER2 negative by FISH, Ki-67 5%  Pathology and radiology counseling:Discussed with the patient, the details of pathology including the type of breast cancer,the clinical staging, the significance of ER, PR and HER-2/neu receptors and the implications for treatment. After reviewing the pathology in detail, we proceeded to discuss the different treatment options between surgery, radiation, chemotherapy, antiestrogen therapies.  Recommendations: 1. Breast conserving surgery followed by 2. Adjuvant radiation therapy followed by 3. Adjuvant antiestrogen therapy    Return to clinic after surgery to discuss final pathology report and then determine if Oncotype DX testing will need to be sent.

## 2021-02-24 NOTE — Progress Notes (Signed)
[place] Psychosocial Distress Screening Spiritual Care  Met with Taleah and her husband Elberta Fortis in [facility] to introduce [facility] team/resources, reviewing distress screen per protocol.  The patient scored a 10 on the Psychosocial Distress Thermometer which indicates severe distress. Also assessed for distress and other psychosocial needs.   ONCBCN DISTRESS SCREENING 02/24/2021  Screening Type Initial Screening  Distress experienced in past week (1-10) 10  Emotional problem type Nervousness/Anxiety;Depression  Information Concerns Type Lack of info about diagnosis;Lack of info about treatment  Physical Problem type Pain   Ms Sallas reports that meeting team and learning treatment plan helped reduce her distress to a 5. She reports good support and welcomes an Pharmacist, hospital referral.   Follow up needed: Yes.  Placing [facility] referral and adding Ms Rossner to [facility] email list per her request. We plan to follow up by phone in ca two weeks.   [place], [place], [facility] Pager [phone] Voicemail [phone]

## 2021-02-25 ENCOUNTER — Telehealth: Payer: Self-pay | Admitting: Pharmacy Technician

## 2021-02-25 DIAGNOSIS — E1140 Type 2 diabetes mellitus with diabetic neuropathy, unspecified: Secondary | ICD-10-CM | POA: Diagnosis not present

## 2021-02-25 NOTE — Telephone Encounter (Signed)
Patient Advocate Encounter   Received notification from [facility] that prior authorization for OMNIPOD DASH KIT & PODS is required.   PA submitted on 02/25/21  UID: [id number] FOR THE PODS UID: [id number] FOR THE KIT  Status is pending    [facility] will continue to follow   Ronney Asters, CPhT Patient Advocate [facility] Phone: [phone] Fax:  [phone]

## 2021-02-26 ENCOUNTER — Telehealth: Payer: Self-pay | Admitting: Hematology and Oncology

## 2021-02-26 ENCOUNTER — Other Ambulatory Visit: Payer: Self-pay | Admitting: General Surgery

## 2021-02-26 DIAGNOSIS — M545 Low back pain, unspecified: Secondary | ICD-10-CM | POA: Diagnosis not present

## 2021-02-26 DIAGNOSIS — Z6841 Body Mass Index (BMI) 40.0 and over, adult: Secondary | ICD-10-CM | POA: Diagnosis not present

## 2021-02-26 DIAGNOSIS — C50411 Malignant neoplasm of upper-outer quadrant of right female breast: Secondary | ICD-10-CM

## 2021-02-26 DIAGNOSIS — Z170 Estrogen receptor positive status [ER+]: Secondary | ICD-10-CM

## 2021-02-26 DIAGNOSIS — M542 Cervicalgia: Secondary | ICD-10-CM | POA: Diagnosis not present

## 2021-02-26 DIAGNOSIS — Z79899 Other long term (current) drug therapy: Secondary | ICD-10-CM | POA: Diagnosis not present

## 2021-02-26 DIAGNOSIS — E119 Type 2 diabetes mellitus without complications: Secondary | ICD-10-CM | POA: Diagnosis not present

## 2021-02-26 DIAGNOSIS — M5136 Other intervertebral disc degeneration, lumbar region: Secondary | ICD-10-CM | POA: Diagnosis not present

## 2021-02-26 DIAGNOSIS — M546 Pain in thoracic spine: Secondary | ICD-10-CM | POA: Diagnosis not present

## 2021-02-26 NOTE — Telephone Encounter (Signed)
Scheduled appt per 9/16 sch msg. Called pt, no answer. Left msg with appt date and time.

## 2021-03-02 ENCOUNTER — Ambulatory Visit (INDEPENDENT_AMBULATORY_CARE_PROVIDER_SITE_OTHER): Payer: PPO

## 2021-03-02 ENCOUNTER — Ambulatory Visit (INDEPENDENT_AMBULATORY_CARE_PROVIDER_SITE_OTHER): Payer: PPO | Admitting: Podiatry

## 2021-03-02 ENCOUNTER — Other Ambulatory Visit: Payer: Self-pay

## 2021-03-02 ENCOUNTER — Encounter: Payer: Self-pay | Admitting: Podiatry

## 2021-03-02 DIAGNOSIS — M7750 Other enthesopathy of unspecified foot: Secondary | ICD-10-CM

## 2021-03-02 DIAGNOSIS — M7751 Other enthesopathy of right foot: Secondary | ICD-10-CM

## 2021-03-02 DIAGNOSIS — E1165 Type 2 diabetes mellitus with hyperglycemia: Secondary | ICD-10-CM

## 2021-03-02 DIAGNOSIS — M7752 Other enthesopathy of left foot: Secondary | ICD-10-CM

## 2021-03-02 NOTE — Patient Instructions (Signed)
While at your visit today you received a steroid injection in your foot or ankle to help with your pain. Along with having the steroid medication there is some "numbing" medication in the shot that you received. Due to this you may notice some numbness to the area for the next couple of hours.    The actually benefit from the steroid injection may take up to 2-7 days to see a difference. You may actually experience a small (as in 10%) INCREASE in pain in the first 24 hours---that is common. It would be best if you can ice the area today and take anti-inflammatory medications (such as Ibuprofen, Motrin, or Aleve) if you are able to take these medications. If you were prescribed another medication to help with the pain go ahead and start that medication today    Things to watch out for that you should contact us or a health care provider urgently would include: 1. Unusual (as in more than 10%) increase in pain 2. New fever > 101.5 3. New swelling or redness of the injected area.  4. Streaking of red lines around the area injected.  If you have any questions or concerns about this, please give our office a call at [phone].    

## 2021-03-03 ENCOUNTER — Encounter: Payer: Self-pay | Admitting: Internal Medicine

## 2021-03-03 DIAGNOSIS — G4733 Obstructive sleep apnea (adult) (pediatric): Secondary | ICD-10-CM | POA: Diagnosis not present

## 2021-03-04 ENCOUNTER — Encounter: Payer: Self-pay | Admitting: *Deleted

## 2021-03-04 ENCOUNTER — Telehealth: Payer: Self-pay | Admitting: *Deleted

## 2021-03-04 NOTE — Pre-Procedure Instructions (Signed)
Surgical Instructions    Your procedure is scheduled on Friday 03/12/21.   Report to [facility] Main Entrance "A" at 08:30 A.M., then check in with the Admitting office.  Call this number if you have problems the morning of surgery:  [phone]   If you have any questions prior to your surgery date call [phone]: Open Monday-Friday 8am-4pm    Remember:  Do not eat after midnight the night before your surgery  You may drink clear liquids until 07:30 A.M. the morning of your surgery.   Clear liquids allowed are: Water, Non-Citrus Juices (without pulp), Carbonated Beverages, Clear Tea, Black Coffee ONLY (NO MILK, CREAM OR POWDERED CREAMER of any kind), and Gatorade    Take these medicines the morning of surgery with A SIP OF WATER   esomeprazole (NEXIUM)  dicyclomine (BENTYL)  ezetimibe (ZETIA)   gabapentin (NEURONTIN)   rosuvastatin (CRESTOR)    Take these medicines if needed:   albuterol (VENTOLIN HFA)  budesonide-formoterol (SYMBICORT)   oxyCODONE-acetaminophen (PERCOCET)    As of today, STOP taking any Aspirin (unless otherwise instructed by your surgeon) Aleve, Naproxen, Ibuprofen, Motrin, Advil, Goody's, BC's, all herbal medications, fish oil, and all vitamins.  WHAT DO I DO ABOUT MY DIABETES MEDICATION?   Do not take oral diabetes medicines (pills) the morning of surgery.  Do not take dapagliflozin propanediol (FARXIGA) the day before surgery 03/11/21 or the morning of surgery 03/12/21.  Do not take metFORMIN (GLUCOPHAGE) the morning of surgery 03/12/21.     THE MORNING OF SURGERY, do not take insulin aspart protamine - aspart (NOVOLOG 70/30 FLEXPEN)  The day of surgery, do not take other diabetes injectables, including Byetta (exenatide), Bydureon (exenatide ER), Victoza (liraglutide), or Trulicity (dulaglutide) Semaglutide (OZEMPIC).  If your CBG is greater than 220 mg/dL, you may take  of your sliding scale (correction) dose of insulin.   HOW TO MANAGE  YOUR DIABETES BEFORE AND AFTER SURGERY  Why is it important to control my blood sugar before and after surgery? Improving blood sugar levels before and after surgery helps healing and can limit problems. A way of improving blood sugar control is eating a healthy diet by:  Eating less sugar and carbohydrates  Increasing activity/exercise  Talking with your doctor about reaching your blood sugar goals High blood sugars (greater than 180 mg/dL) can raise your risk of infections and slow your recovery, so you will need to focus on controlling your diabetes during the weeks before surgery. Make sure that the doctor who takes care of your diabetes knows about your planned surgery including the date and location.  How do I manage my blood sugar before surgery? Check your blood sugar at least 4 times a day, starting 2 days before surgery, to make sure that the level is not too high or low.  Check your blood sugar the morning of your surgery when you wake up and every 2 hours until you get to the Short Stay unit.  If your blood sugar is less than 70 mg/dL, you will need to treat for low blood sugar: Do not take insulin. Treat a low blood sugar (less than 70 mg/dL) with  cup of clear juice (cranberry or apple), 4 glucose tablets, OR glucose gel. Recheck blood sugar in 15 minutes after treatment (to make sure it is greater than 70 mg/dL). If your blood sugar is not greater than 70 mg/dL on recheck, call [phone] for further instructions. Report your blood sugar to the short stay nurse when you  get to Short Stay.  If you are admitted to the hospital after surgery: Your blood sugar will be checked by the staff and you will probably be given insulin after surgery (instead of oral diabetes medicines) to make sure you have good blood sugar levels. The goal for blood sugar control after surgery is 80-180 mg/dL.           Do not wear jewelry or makeup Do not wear lotions, powders, perfumes/colognes,  or deodorant. Do not shave 48 hours prior to surgery.  Men may shave face and neck. Do not bring valuables to the hospital. DO Not wear nail polish, gel polish, artificial nails, or any other type of covering on natural nails including finger and toenails. If patients have artificial nails, gel coating, etc. that need to be removed by a nail salon please have this removed prior to surgery or surgery may need to be canceled/delayed if the surgeon/ anesthesia feels like the patient is unable to be adequately monitored.             [facility] is not responsible for any belongings or valuables.  Do NOT Smoke (Tobacco/Vaping)  24 hours prior to your procedure If you use a CPAP at night, you may bring your mask for your overnight stay.   Contacts, glasses, dentures or bridgework may not be worn into surgery, please bring cases for these belongings   For patients admitted to the hospital, discharge time will be determined by your treatment team.   Patients discharged the day of surgery will not be allowed to drive home, and someone needs to stay with them for 24 hours.  NO VISITORS WILL BE ALLOWED IN PRE-OP WHERE PATIENTS GET READY FOR SURGERY.  ONLY 1 SUPPORT PERSON MAY BE PRESENT WHILE YOU ARE IN SURGERY.  IF YOU ARE TO BE ADMITTED, ONCE YOU ARE IN YOUR ROOM YOU WILL BE ALLOWED TWO (2) VISITORS.  Minor children may have two parents present. Special consideration for safety and communication needs will be reviewed on a case by case basis.  Special instructions:    Oral Hygiene is also important to reduce your risk of infection.  Remember - BRUSH YOUR TEETH THE MORNING OF SURGERY WITH YOUR REGULAR TOOTHPASTE   [place]- Preparing For Surgery  Before surgery, you can play an important role. Because skin is not sterile, your skin needs to be as free of germs as possible. You can reduce the number of germs on your skin by washing with CHG (chlorahexidine gluconate) Soap before surgery.  CHG is an  antiseptic cleaner which kills germs and bonds with the skin to continue killing germs even after washing.     Please do not use if you have an allergy to CHG or antibacterial soaps. If your skin becomes reddened/irritated stop using the CHG.  Do not shave (including legs and underarms) for at least 48 hours prior to first CHG shower. It is OK to shave your face.  Please follow these instructions carefully.     Shower the NIGHT BEFORE SURGERY and the MORNING OF SURGERY with CHG Soap.   If you chose to wash your hair, wash your hair first as usual with your normal shampoo. After you shampoo, rinse your hair and body thoroughly to remove the shampoo.  Then [employer] and genitals (private parts) with your normal soap and rinse thoroughly to remove soap.  After that Use CHG Soap as you would any other liquid soap. You can apply CHG directly  to the skin and wash gently with a scrungie or a clean washcloth.   Apply the CHG Soap to your body ONLY FROM THE NECK DOWN.  Do not use on open wounds or open sores. Avoid contact with your eyes, ears, mouth and genitals (private parts). Wash Face and genitals (private parts)  with your normal soap.   Wash thoroughly, paying special attention to the area where your surgery will be performed.  Thoroughly rinse your body with warm water from the neck down.  DO NOT shower/wash with your normal soap after using and rinsing off the CHG Soap.  Pat yourself dry with a CLEAN TOWEL.  Wear CLEAN PAJAMAS to bed the night before surgery  Place CLEAN SHEETS on your bed the night before your surgery  DO NOT SLEEP WITH PETS.   Day of Surgery:  Take a shower with CHG soap. Wear Clean/Comfortable clothing the morning of surgery Do not apply any deodorants/lotions.   Remember to brush your teeth WITH YOUR REGULAR TOOTHPASTE.   Please read over the following fact sheets that you were given.

## 2021-03-04 NOTE — Telephone Encounter (Signed)
Left message for a return phone call to follow up from [facility] and assess navigation needs. 

## 2021-03-05 ENCOUNTER — Other Ambulatory Visit: Payer: Self-pay

## 2021-03-05 ENCOUNTER — Encounter (HOSPITAL_COMMUNITY)
Admission: RE | Admit: 2021-03-05 | Discharge: 2021-03-05 | Disposition: A | Discharge status: Home / Self Care | Payer: PPO | Source: Ambulatory Visit | Attending: General Surgery | Admitting: General Surgery

## 2021-03-05 ENCOUNTER — Other Ambulatory Visit: Payer: Self-pay | Admitting: Internal Medicine

## 2021-03-05 ENCOUNTER — Encounter (HOSPITAL_COMMUNITY): Payer: Self-pay

## 2021-03-05 ENCOUNTER — Other Ambulatory Visit: Payer: Self-pay | Admitting: Gastroenterology

## 2021-03-05 DIAGNOSIS — K2970 Gastritis, unspecified, without bleeding: Secondary | ICD-10-CM

## 2021-03-05 DIAGNOSIS — Z01818 Encounter for other preprocedural examination: Secondary | ICD-10-CM | POA: Diagnosis not present

## 2021-03-05 DIAGNOSIS — R1013 Epigastric pain: Secondary | ICD-10-CM

## 2021-03-05 DIAGNOSIS — E118 Type 2 diabetes mellitus with unspecified complications: Secondary | ICD-10-CM | POA: Diagnosis not present

## 2021-03-05 DIAGNOSIS — I10 Essential (primary) hypertension: Secondary | ICD-10-CM

## 2021-03-05 HISTORY — DX: Chronic obstructive pulmonary disease, unspecified: J44.9

## 2021-03-05 LAB — GLUCOSE, CAPILLARY: Glucose-Capillary: 221 mg/dL — ABNORMAL HIGH (ref 70–99)

## 2021-03-05 NOTE — Progress Notes (Signed)
PCP - [facility] Cardiologist - nonwe  PPM/ICD - denies Device Orders -  Rep Notified -   Chest x-ray - none EKG - 03/05/21 Stress Test - none ECHO - 07/10/20 Cardiac Cath - none  Sleep Study - 08/11/2017 CPAP - yes  Fasting Blood Sugar - 105-200 Checks Blood Sugar _3-4 times a day  Blood Thinner Instructions:n/a Aspirin Instructions:n/a  ERAS Protcol -clear liquids until 0730 PRE-SURGERY Ensure or G2- no  COVID TEST- n/a ambulatory surgery   Anesthesia review: yes  Patient denies shortness of breath, fever, cough and chest pain at PAT appointment   All instructions explained to the patient, with a verbal understanding of the material. Patient agrees to go over the instructions while at home for a better understanding. Patient also instructed to self quarantine after being tested for COVID-19. The opportunity to ask questions was provided.

## 2021-03-07 NOTE — Progress Notes (Signed)
Subjective: 60 year old female presents the office today with concerns of discomfort to both of her ankles with the left side >> right.  She points along sinus tarsi which cause discomfort.  No recent injury or trauma.  She states that she is walking differently causes her hip to hurt.  She has some minimal swelling to the area.  No redness or warmth.  No other concerns.  No recent treatment.   Objective: AAO x3, NAD DP/PT pulses palpable bilaterally, CRT less than 3 seconds Tenderness palpation along the sinus tarsi in the left foot and minimally to the right foot.  Localized edema but no erythema or warmth.  No pain to the ankle itself.  There is no area of pinpoint tenderness.  MMT no open lesions or pre-ulcerative lesions.  No pain with calf compression, swelling, warmth, erythema  Assessment: Capsulitis left subtalar joint  Plan: -All treatment options discussed with the patient including all alternatives, risks, complications.  -X-rays obtained reviewed.  No evidence of acute fracture today. -Steroid injection performed to the left sinus tarsi.  Skin was prepped with Betadine.  Mixture 1 cc Kenalog 10, 0.5 cc of Marcaine plain, 0.5 cc of lidocaine plain was infiltrated into the subtalar joint without complications from a lateral approach.  She tolerated the procedure well.  Postinjection care discussed. -Ankle brace as needed -Patient encouraged to call the office with any questions, concerns, change in symptoms.   Trula Slade DPM

## 2021-03-10 ENCOUNTER — Encounter: Payer: Self-pay | Admitting: General Practice

## 2021-03-10 NOTE — Progress Notes (Signed)
[facility]ual Care Note  Reached [name] by phone for brief pastoral check-in. She was delighted by the call and timing because she is feeling anxious about her surgery Friday. Provided empathic listening, normalization of feelings, reminder of [facility] availability for support and encouragement/empowerment (she has received message, but not gotten to return the call yet). [name] welcomes prayer and a follow-up call next week.   [place], [place], [facility] Pager [phone] Voicemail [phone]

## 2021-03-11 ENCOUNTER — Ambulatory Visit
Admission: RE | Admit: 2021-03-11 | Discharge: 2021-03-11 | Disposition: A | Discharge status: Home / Self Care | Payer: PPO | Source: Ambulatory Visit | Attending: General Surgery | Admitting: General Surgery

## 2021-03-11 ENCOUNTER — Other Ambulatory Visit: Payer: Self-pay

## 2021-03-11 DIAGNOSIS — R928 Other abnormal and inconclusive findings on diagnostic imaging of breast: Secondary | ICD-10-CM | POA: Diagnosis not present

## 2021-03-11 DIAGNOSIS — C50411 Malignant neoplasm of upper-outer quadrant of right female breast: Secondary | ICD-10-CM

## 2021-03-11 DIAGNOSIS — Z170 Estrogen receptor positive status [ER+]: Secondary | ICD-10-CM

## 2021-03-12 ENCOUNTER — Encounter (HOSPITAL_COMMUNITY)
Admission: RE | Disposition: A | Discharge status: Home / Self Care | Payer: Self-pay | Source: Ambulatory Visit | Attending: General Surgery

## 2021-03-12 ENCOUNTER — Encounter (HOSPITAL_COMMUNITY): Payer: Self-pay | Admitting: General Surgery

## 2021-03-12 ENCOUNTER — Ambulatory Visit (HOSPITAL_COMMUNITY): Payer: PPO | Admitting: Anesthesiology

## 2021-03-12 ENCOUNTER — Ambulatory Visit
Admission: RE | Admit: 2021-03-12 | Discharge: 2021-03-12 | Disposition: A | Discharge status: Home / Self Care | Payer: PPO | Source: Ambulatory Visit | Attending: General Surgery | Admitting: General Surgery

## 2021-03-12 ENCOUNTER — Ambulatory Visit (HOSPITAL_COMMUNITY): Payer: PPO | Admitting: Physician Assistant

## 2021-03-12 ENCOUNTER — Encounter (HOSPITAL_COMMUNITY)
Admission: RE | Admit: 2021-03-12 | Discharge: 2021-03-12 | Disposition: A | Discharge status: Home / Self Care | Payer: PPO | Source: Ambulatory Visit | Attending: General Surgery | Admitting: General Surgery

## 2021-03-12 ENCOUNTER — Ambulatory Visit (HOSPITAL_COMMUNITY)
Admission: RE | Admit: 2021-03-12 | Discharge: 2021-03-12 | Disposition: A | Discharge status: Home / Self Care | Payer: PPO | Source: Ambulatory Visit | Attending: General Surgery | Admitting: General Surgery

## 2021-03-12 ENCOUNTER — Other Ambulatory Visit: Payer: Self-pay

## 2021-03-12 DIAGNOSIS — Z9989 Dependence on other enabling machines and devices: Secondary | ICD-10-CM | POA: Diagnosis not present

## 2021-03-12 DIAGNOSIS — Z170 Estrogen receptor positive status [ER+]: Secondary | ICD-10-CM

## 2021-03-12 DIAGNOSIS — E1169 Type 2 diabetes mellitus with other specified complication: Secondary | ICD-10-CM | POA: Diagnosis not present

## 2021-03-12 DIAGNOSIS — C50411 Malignant neoplasm of upper-outer quadrant of right female breast: Secondary | ICD-10-CM | POA: Insufficient documentation

## 2021-03-12 DIAGNOSIS — Z7982 Long term (current) use of aspirin: Secondary | ICD-10-CM | POA: Diagnosis not present

## 2021-03-12 DIAGNOSIS — Z803 Family history of malignant neoplasm of breast: Secondary | ICD-10-CM | POA: Insufficient documentation

## 2021-03-12 DIAGNOSIS — E785 Hyperlipidemia, unspecified: Secondary | ICD-10-CM | POA: Diagnosis not present

## 2021-03-12 DIAGNOSIS — Z7951 Long term (current) use of inhaled steroids: Secondary | ICD-10-CM | POA: Diagnosis not present

## 2021-03-12 DIAGNOSIS — Z7984 Long term (current) use of oral hypoglycemic drugs: Secondary | ICD-10-CM | POA: Insufficient documentation

## 2021-03-12 DIAGNOSIS — Z79899 Other long term (current) drug therapy: Secondary | ICD-10-CM | POA: Insufficient documentation

## 2021-03-12 DIAGNOSIS — Z888 Allergy status to other drugs, medicaments and biological substances status: Secondary | ICD-10-CM | POA: Diagnosis not present

## 2021-03-12 DIAGNOSIS — G8918 Other acute postprocedural pain: Secondary | ICD-10-CM | POA: Diagnosis not present

## 2021-03-12 DIAGNOSIS — Z794 Long term (current) use of insulin: Secondary | ICD-10-CM | POA: Diagnosis not present

## 2021-03-12 DIAGNOSIS — Z87891 Personal history of nicotine dependence: Secondary | ICD-10-CM | POA: Diagnosis not present

## 2021-03-12 DIAGNOSIS — N6011 Diffuse cystic mastopathy of right breast: Secondary | ICD-10-CM | POA: Diagnosis not present

## 2021-03-12 DIAGNOSIS — R928 Other abnormal and inconclusive findings on diagnostic imaging of breast: Secondary | ICD-10-CM | POA: Diagnosis not present

## 2021-03-12 DIAGNOSIS — G4733 Obstructive sleep apnea (adult) (pediatric): Secondary | ICD-10-CM | POA: Diagnosis not present

## 2021-03-12 DIAGNOSIS — C50911 Malignant neoplasm of unspecified site of right female breast: Secondary | ICD-10-CM | POA: Diagnosis not present

## 2021-03-12 HISTORY — PX: BREAST LUMPECTOMY WITH RADIOACTIVE SEED AND SENTINEL LYMPH NODE BIOPSY: SHX6550

## 2021-03-12 HISTORY — PX: BREAST LUMPECTOMY: SHX2

## 2021-03-12 LAB — GLUCOSE, CAPILLARY: Glucose-Capillary: 158 mg/dL — ABNORMAL HIGH (ref 70–99)

## 2021-03-12 SURGERY — BREAST LUMPECTOMY WITH RADIOACTIVE SEED AND SENTINEL LYMPH NODE BIOPSY
Anesthesia: General | Site: Breast | Laterality: Right

## 2021-03-12 MED ORDER — OXYCODONE HCL 5 MG/5ML PO SOLN: 5.0000 mg | Freq: Once | ORAL | Status: AC | PRN

## 2021-03-12 MED ORDER — FENTANYL CITRATE (PF) 100 MCG/2ML IJ SOLN
INTRAMUSCULAR | Status: AC
  Filled 2021-03-12: qty 2

## 2021-03-12 MED ORDER — PROPOFOL 10 MG/ML IV BOLUS
INTRAVENOUS | Status: DC | PRN
Start: 2021-03-12 — End: 2021-03-12
  Administered 2021-03-12: 200 mg via INTRAVENOUS

## 2021-03-12 MED ORDER — METHYLENE BLUE 0.5 % INJ SOLN
INTRAVENOUS | Status: AC
  Filled 2021-03-12: qty 10

## 2021-03-12 MED ORDER — PHENYLEPHRINE 40 MCG/ML (10ML) SYRINGE FOR IV PUSH (FOR BLOOD PRESSURE SUPPORT)
PREFILLED_SYRINGE | INTRAVENOUS | Status: DC | PRN
  Administered 2021-03-12: 40 ug via INTRAVENOUS
  Administered 2021-03-12 (×2): 80 ug via INTRAVENOUS

## 2021-03-12 MED ORDER — BUPIVACAINE LIPOSOME 1.3 % IJ SUSP
INTRAMUSCULAR | Status: DC | PRN
  Administered 2021-03-12: 10 mL

## 2021-03-12 MED ORDER — CELECOXIB 200 MG PO CAPS
ORAL_CAPSULE | ORAL | Status: AC
  Administered 2021-03-12: 200 mg via ORAL
  Filled 2021-03-12: qty 1

## 2021-03-12 MED ORDER — ONDANSETRON HCL 4 MG/2ML IJ SOLN
INTRAMUSCULAR | Status: AC
  Filled 2021-03-12: qty 2

## 2021-03-12 MED ORDER — FENTANYL CITRATE (PF) 100 MCG/2ML IJ SOLN
INTRAMUSCULAR | Status: AC
  Administered 2021-03-12: 100 ug via INTRAVENOUS
  Filled 2021-03-12: qty 2

## 2021-03-12 MED ORDER — LACTATED RINGERS IV SOLN: INTRAVENOUS | Status: DC

## 2021-03-12 MED ORDER — BUPIVACAINE HCL (PF) 0.5 % IJ SOLN
INTRAMUSCULAR | Status: DC | PRN
  Administered 2021-03-12: 15 mL

## 2021-03-12 MED ORDER — ACETAMINOPHEN 500 MG PO TABS
ORAL_TABLET | ORAL | Status: AC
  Administered 2021-03-12: 500 mg via ORAL
  Filled 2021-03-12: qty 2

## 2021-03-12 MED ORDER — OXYCODONE HCL 5 MG PO TABS
ORAL_TABLET | ORAL | Status: AC
  Filled 2021-03-12: qty 1

## 2021-03-12 MED ORDER — TECHNETIUM TC 99M TILMANOCEPT KIT
1.0000 | PACK | Freq: Once | INTRAVENOUS | Status: AC | PRN
  Administered 2021-03-12: 1 via INTRADERMAL

## 2021-03-12 MED ORDER — GABAPENTIN 300 MG PO CAPS
ORAL_CAPSULE | ORAL | Status: AC
  Filled 2021-03-12: qty 1

## 2021-03-12 MED ORDER — BUPIVACAINE-EPINEPHRINE (PF) 0.25% -1:200000 IJ SOLN
INTRAMUSCULAR | Status: AC
  Filled 2021-03-12: qty 30

## 2021-03-12 MED ORDER — GABAPENTIN 300 MG PO CAPS: 300.0000 mg | ORAL_CAPSULE | ORAL | Status: DC

## 2021-03-12 MED ORDER — CHLORHEXIDINE GLUCONATE 0.12 % MT SOLN
15.0000 mL | Freq: Once | OROMUCOSAL | Status: AC
  Administered 2021-03-12: 15 mL via OROMUCOSAL
  Filled 2021-03-12: qty 15

## 2021-03-12 MED ORDER — PROMETHAZINE HCL 25 MG/ML IJ SOLN: 6.2500 mg | INTRAMUSCULAR | Status: DC | PRN

## 2021-03-12 MED ORDER — FENTANYL CITRATE (PF) 100 MCG/2ML IJ SOLN
25.0000 ug | INTRAMUSCULAR | Status: DC | PRN
  Administered 2021-03-12: 50 ug via INTRAVENOUS

## 2021-03-12 MED ORDER — PHENYLEPHRINE 40 MCG/ML (10ML) SYRINGE FOR IV PUSH (FOR BLOOD PRESSURE SUPPORT)
PREFILLED_SYRINGE | INTRAVENOUS | Status: AC
  Filled 2021-03-12: qty 10

## 2021-03-12 MED ORDER — ACETAMINOPHEN 500 MG PO TABS: 1000.0000 mg | ORAL_TABLET | ORAL | Status: AC

## 2021-03-12 MED ORDER — CELECOXIB 200 MG PO CAPS: 200.0000 mg | ORAL_CAPSULE | ORAL | Status: AC

## 2021-03-12 MED ORDER — DEXAMETHASONE SODIUM PHOSPHATE 4 MG/ML IJ SOLN
INTRAMUSCULAR | Status: DC | PRN
  Administered 2021-03-12: 5 mg via INTRAVENOUS

## 2021-03-12 MED ORDER — CEFAZOLIN SODIUM-DEXTROSE 2-4 GM/100ML-% IV SOLN
2.0000 g | INTRAVENOUS | Status: AC
  Administered 2021-03-12: 2 g via INTRAVENOUS

## 2021-03-12 MED ORDER — CEFAZOLIN SODIUM-DEXTROSE 2-4 GM/100ML-% IV SOLN
INTRAVENOUS | Status: AC
  Filled 2021-03-12: qty 100

## 2021-03-12 MED ORDER — MIDAZOLAM HCL 2 MG/2ML IJ SOLN: 1.0000 mg | Freq: Once | INTRAMUSCULAR | Status: AC

## 2021-03-12 MED ORDER — BUPIVACAINE-EPINEPHRINE 0.25% -1:200000 IJ SOLN
INTRAMUSCULAR | Status: DC | PRN
  Administered 2021-03-12: 20 mL

## 2021-03-12 MED ORDER — LIDOCAINE 2% (20 MG/ML) 5 ML SYRINGE
INTRAMUSCULAR | Status: DC | PRN
  Administered 2021-03-12: 60 mg via INTRAVENOUS

## 2021-03-12 MED ORDER — 0.9 % SODIUM CHLORIDE (POUR BTL) OPTIME
TOPICAL | Status: DC | PRN
  Administered 2021-03-12: 1000 mL

## 2021-03-12 MED ORDER — DEXAMETHASONE SODIUM PHOSPHATE 10 MG/ML IJ SOLN
INTRAMUSCULAR | Status: AC
  Filled 2021-03-12: qty 1

## 2021-03-12 MED ORDER — MIDAZOLAM HCL 2 MG/2ML IJ SOLN
INTRAMUSCULAR | Status: AC
  Administered 2021-03-12: 1 mg via INTRAVENOUS
  Filled 2021-03-12: qty 2

## 2021-03-12 MED ORDER — LIDOCAINE 2% (20 MG/ML) 5 ML SYRINGE
INTRAMUSCULAR | Status: AC
  Filled 2021-03-12: qty 5

## 2021-03-12 MED ORDER — CHLORHEXIDINE GLUCONATE CLOTH 2 % EX PADS: 6.0000 | MEDICATED_PAD | Freq: Once | CUTANEOUS | Status: DC

## 2021-03-12 MED ORDER — ONDANSETRON HCL 4 MG/2ML IJ SOLN
INTRAMUSCULAR | Status: DC | PRN
  Administered 2021-03-12: 4 mg via INTRAVENOUS

## 2021-03-12 MED ORDER — HYDROCODONE-ACETAMINOPHEN 5-325 MG PO TABS: 1.0000 | ORAL_TABLET | Freq: Four times a day (QID) | ORAL | 0 refills | Status: DC | PRN

## 2021-03-12 MED ORDER — ORAL CARE MOUTH RINSE: 15.0000 mL | Freq: Once | OROMUCOSAL | Status: AC

## 2021-03-12 MED ORDER — FENTANYL CITRATE (PF) 100 MCG/2ML IJ SOLN: 100.0000 ug | Freq: Once | INTRAMUSCULAR | Status: AC

## 2021-03-12 MED ORDER — OXYCODONE HCL 5 MG PO TABS
5.0000 mg | ORAL_TABLET | Freq: Once | ORAL | Status: AC | PRN
  Administered 2021-03-12: 5 mg via ORAL

## 2021-03-12 MED ORDER — SODIUM CHLORIDE (PF) 0.9 % IJ SOLN
INTRAMUSCULAR | Status: AC
  Filled 2021-03-12: qty 10

## 2021-03-12 SURGICAL SUPPLY — 43 items
ADH SKN CLS APL DERMABOND .7 (GAUZE/BANDAGES/DRESSINGS) ×1
APL PRP STRL LF DISP 70% ISPRP (MISCELLANEOUS) ×1
APPLIER CLIP 9.375 MED OPEN (MISCELLANEOUS) ×2
APR CLP MED 9.3 20 MLT OPN (MISCELLANEOUS) ×1
BAG COUNTER SPONGE SURGICOUNT (BAG) IMPLANT
BAG SPNG CNTER NS LX DISP (BAG)
BINDER BREAST LRG (GAUZE/BANDAGES/DRESSINGS) IMPLANT
BINDER BREAST XLRG (GAUZE/BANDAGES/DRESSINGS) IMPLANT
CANISTER SUCT 3000ML PPV (MISCELLANEOUS) ×2 IMPLANT
CHLORAPREP W/TINT 26 (MISCELLANEOUS) ×2 IMPLANT
CLIP APPLIE 9.375 MED OPEN (MISCELLANEOUS) ×1 IMPLANT
CNTNR URN SCR LID CUP LEK RST (MISCELLANEOUS) ×1 IMPLANT
CONT SPEC 4OZ STRL OR WHT (MISCELLANEOUS) ×2
COVER PROBE W GEL 5X96 (DRAPES) ×2 IMPLANT
COVER SURGICAL LIGHT HANDLE (MISCELLANEOUS) ×2 IMPLANT
DERMABOND ADVANCED (GAUZE/BANDAGES/DRESSINGS) ×1
DERMABOND ADVANCED .7 DNX12 (GAUZE/BANDAGES/DRESSINGS) ×1 IMPLANT
DEVICE DUBIN SPECIMEN MAMMOGRA (MISCELLANEOUS) ×2 IMPLANT
DRAPE CHEST BREAST 15X10 FENES (DRAPES) ×2 IMPLANT
DRSG PAD ABDOMINAL 8X10 ST (GAUZE/BANDAGES/DRESSINGS) ×1 IMPLANT
ELECT COATED BLADE 2.86 ST (ELECTRODE) ×2 IMPLANT
ELECT REM PT RETURN 9FT ADLT (ELECTROSURGICAL) ×2
ELECTRODE REM PT RTRN 9FT ADLT (ELECTROSURGICAL) ×1 IMPLANT
GAUZE SPONGE 4X4 12PLY STRL (GAUZE/BANDAGES/DRESSINGS) ×1 IMPLANT
GLOVE SURG ENC MOIS LTX SZ7.5 (GLOVE) ×4 IMPLANT
GOWN STRL REUS W/ TWL LRG LVL3 (GOWN DISPOSABLE) ×2 IMPLANT
GOWN STRL REUS W/TWL LRG LVL3 (GOWN DISPOSABLE) ×4
KIT BASIN OR (CUSTOM PROCEDURE TRAY) ×2 IMPLANT
KIT MARKER MARGIN INK (KITS) ×2 IMPLANT
LIGHT WAVEGUIDE WIDE FLAT (MISCELLANEOUS) IMPLANT
NDL FILTER BLUNT 18X1 1/2 (NEEDLE) IMPLANT
NDL HYPO 25GX1X1/2 BEV (NEEDLE) ×1 IMPLANT
NEEDLE 18GX1X1/2 (RX/OR ONLY) (NEEDLE) IMPLANT
NEEDLE FILTER BLUNT 18X 1/2SAF (NEEDLE)
NEEDLE FILTER BLUNT 18X1 1/2 (NEEDLE) IMPLANT
NEEDLE HYPO 25GX1X1/2 BEV (NEEDLE) ×2 IMPLANT
NS IRRIG 1000ML POUR BTL (IV SOLUTION) ×2 IMPLANT
PACK GENERAL/GYN (CUSTOM PROCEDURE TRAY) ×2 IMPLANT
SUT MNCRL AB 4-0 PS2 18 (SUTURE) ×4 IMPLANT
SUT VIC AB 3-0 SH 18 (SUTURE) ×2 IMPLANT
SYR CONTROL 10ML LL (SYRINGE) ×2 IMPLANT
TOWEL GREEN STERILE (TOWEL DISPOSABLE) ×2 IMPLANT
TOWEL GREEN STERILE FF (TOWEL DISPOSABLE) ×2 IMPLANT

## 2021-03-12 NOTE — Transfer of Care (Signed)
Immediate Anesthesia Transfer of Care Note  Patient: [name]  Procedure(s) Performed: RIGHT BREAST LUMPECTOMY WITH RADIOACTIVE SEED AND SENTINEL LYMPH NODE BIOPSY (Right: Breast)  Patient Location: PACU  Anesthesia Type:General  Level of Consciousness: awake  Airway & Oxygen Therapy: Patient Spontanous Breathing and Patient connected to nasal cannula oxygen  Post-op Assessment: Report given to RN and Post -op Vital signs reviewed and stable  Post vital signs: Reviewed and stable  Last Vitals:  Vitals Value Taken Time  BP 123/65 03/12/21 1202  Temp    Pulse 72 03/12/21 1203  Resp 10 03/12/21 1203  SpO2 100 % 03/12/21 1203  Vitals shown include unvalidated device data.  Last Pain:  Vitals:   03/12/21 1005  TempSrc:   PainSc: 0-No pain      Patients Stated Pain Goal: 0 (13/88/71 9597)  Complications: No notable events documented.

## 2021-03-12 NOTE — Anesthesia Postprocedure Evaluation (Signed)
Anesthesia Post Note  Patient: [name]  Procedure(s) Performed: RIGHT BREAST LUMPECTOMY WITH RADIOACTIVE SEED AND SENTINEL LYMPH NODE BIOPSY (Right: Breast)     Patient location during evaluation: PACU Anesthesia Type: General Level of consciousness: awake and alert Pain management: pain level controlled Vital Signs Assessment: post-procedure vital signs reviewed and stable Respiratory status: spontaneous breathing, nonlabored ventilation, respiratory function stable and patient connected to nasal cannula oxygen Cardiovascular status: stable and blood pressure returned to baseline Anesthetic complications: no   No notable events documented.  Last Vitals:  Vitals:   03/12/21 1247 03/12/21 1302  BP: 116/60 128/64  Pulse: 72 71  Resp: 12 11  Temp:  (!) 36.4 C  SpO2: 93% 97%    Last Pain:  Vitals:   03/12/21 1302  TempSrc:   PainSc: [place]

## 2021-03-12 NOTE — Op Note (Signed)
03/12/2021  11:53 AM  PATIENT:  [name]  60 y.o. female  PRE-OPERATIVE DIAGNOSIS:  RIGHT BREAST CANCER  POST-OPERATIVE DIAGNOSIS:  RIGHT BREAST CANCER  PROCEDURE:  Procedure(s): RIGHT BREAST LUMPECTOMY WITH RADIOACTIVE SEED LOCALIZATION AND DEEP RIGHT AXILLARY SENTINEL LYMPH NODE BIOPSY (Right)  SURGEON:  Surgeon(s) and Role:    * Jovita Kussmaul, MD - Primary  PHYSICIAN ASSISTANT:   ASSISTANTS: none   ANESTHESIA:   local and general  EBL:  minimal   BLOOD ADMINISTERED:none  DRAINS: none   LOCAL MEDICATIONS USED:  MARCAINE     SPECIMEN:  Source of Specimen:  right breast tissue with additional inferior margin and sentinel node  DISPOSITION OF SPECIMEN:  PATHOLOGY  COUNTS:  YES  TOURNIQUET:  * No tourniquets in log *  DICTATION: .Dragon Dictation  After informed consent was obtained the patient was brought to the operating room and placed in the supine position on the operating table.  After adequate induction of general anesthesia the patient's right chest, breast, and axillary area were prepped with ChloraPrep, allowed to dry, and draped in usual sterile manner.  An appropriate timeout was performed.  Previously an I-125 seed was placed in the upper outer quadrant of the right breast to mark an area of invasive breast cancer.  Also earlier in the day the patient underwent injection of 1 mCi of technetium sulfur colloid in the subareolar position on the right.  The neoprobe was initially set to technetium and there was a good signal in the right axilla.  The neoprobe was then switched to I-125 in the area of the seed was also readily identified in fairly close proximity to the lymph node.  Because of this I elected to make a curvilinear incision in the upper outer right breast overlying the area of radioactivity and do both parts of the operation through the same incision.  The area around this was infiltrated with quarter percent Marcaine.  The incision was carried through  the skin and subcutaneous tissue sharply with the electrocautery.  Dissection was then carried towards the radioactive seed under the direction of the neoprobe.  Once I more closely approached the radioactive seed I then removed a circular portion of breast tissue sharply with the electrocautery around the radioactive seed while checking the area of radioactivity frequently.  Once the specimen was removed it was oriented with the appropriate paint colors.  A specimen radiograph was obtained that showed the clip and seed to be within the specimen.  I did elect to take an additional inferior margin and this was marked appropriately and sent with the breast tissue as well.  Next the neoprobe was set to technetium and the radioactive seed was readily detected in the deep right axillary space.  Sharp Bovie dissection was carried out under the direction of the neoprobe into the deep right axillary space.  I was then able to identify a large lymph node with radioactive signal without difficulty.  The lymph node was excised sharply with the electrocautery and the surrounding small vessels and lymphatics were controlled with clips.  Ex vivo counts on this node were approximately 1000.  No other hot or palpable nodes were identified in the right axilla.  Hemostasis was achieved using the Bovie electrocautery.  The axilla was then closed with interrupted 3-0 Vicryl stitches.  The cavity was irrigated with saline and infiltrated with more quarter percent Marcaine.  The cavity was marked with clips.  The lumpectomy cavity was then closed with  layers of interrupted 3-0 Vicryl stitches.  The skin was then closed with a running 4-0 Monocryl subcuticular stitch.  Dermabond dressings were applied.  The patient tolerated the procedure well.  At the end of the case all needle sponge and instrument counts were correct.  The patient was then awakened and taken to recovery in stable condition.  PLAN OF CARE: Discharge to home after  PACU  PATIENT DISPOSITION:  PACU - hemodynamically stable.   Delay start of Pharmacological VTE agent (>24hrs) due to surgical blood loss or risk of bleeding: not applicable

## 2021-03-12 NOTE — H&P (Signed)
REFERRING PHYSICIAN: [facility] *  PROVIDER: Landry Corporal, MD  MRN: [id number] DOB: [birth year]  Subjective   Chief Complaint: Breast Cancer   History of Present Illness: [name] is a 60 y.o. female who is seen today as an office consultation at the request of Dr. Imaging for evaluation of Breast Cancer .   We are asked to see the patient in consultation by Dr. Lindi Adie to evaluate her for a new right breast cancer. The patient is a 60 year old black female who recently went for routine screening mammogram. At that time she was found to have a 7 mm area of asymmetry in the upper outer quadrant of the right breast. The axilla looked negative. 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 She does not smoke. She does have a family history of breast cancer in her sister.  Review of Systems: A complete review of systems was obtained from the patient. I have reviewed this information and discussed as appropriate with the patient. See HPI as well for other ROS.  ROS   Medical History: Past Medical History:  Diagnosis Date   Arthritis   Asthma, unspecified asthma severity, unspecified whether complicated, unspecified whether persistent   Diabetes mellitus without complication (CMS-HCC)   GERD (gastroesophageal reflux disease)   Hyperlipidemia   Hypertension   Sleep apnea   Patient Active Problem List  Diagnosis   Dyslipidemia associated with type 2 diabetes mellitus (CMS-HCC)   Malignant neoplasm of upper-outer quadrant of right breast in female, estrogen receptor positive (CMS-HCC)   Past Surgical History:  Procedure Laterality Date   Back Surgery N/A  2012   Breast Reduction Surgery N/A  1982   CATARACT EXTRACTION Right  11/07/2019   HYSTERECTOMY N/A  Total abdominal hysterectomy 05/24/2007   radioactive seed guided excisional breast biopsy Left  02/18/2016    TONSILLECTOMY N/A  2008    Allergies  Allergen Reactions   Lisinopril Cough   Current Outpatient Medications on File Prior to Visit  Medication Sig Dispense Refill   albuterol 90 mcg/actuation inhaler Inhale into the lungs   budesonide-formoteroL (SYMBICORT) 80-4.5 mcg/actuation inhaler Inhale 2 puffs then rinse mouth, twice daily- maintenance   buprenorphine (BUTRANS) 5 mcg/hour PTWK 1 patch once a week.   dapagliflozin (FARXIGA) 10 mg tablet Take 1 tablet by mouth once daily   diclofenac (VOLTAREN) 1 % topical gel Apply topically   dicyclomine (BENTYL) 10 mg capsule TAKE 1 CAPSULE BY MOUTH THREE TIMES DAILY BEFORE MEAL(S)   esomeprazole (NEXIUM) 40 MG DR capsule Take by mouth   ezetimibe (ZETIA) 10 mg tablet Take 1 tablet by mouth once daily   hydroCHLOROthiazide (HYDRODIURIL) 25 MG tablet Take 1 tablet by mouth once daily   insulin ASPART PROTAMINE-ASPART (NOVOLOG MIX 70-30FLEXPEN U-100) 100 unit/mL (70-30) pen injector Inject subcutaneously   irbesartan (AVAPRO) 300 MG tablet Take 1 tablet by mouth once daily   metFORMIN (GLUCOPHAGE) 1000 MG tablet TAKE 1 TABLET BY MOUTH TWICE DAILY WITH A MEAL   methocarbamoL (ROBAXIN) 500 MG tablet Take by mouth   rosuvastatin (CRESTOR) 10 MG tablet Take 1 tablet by mouth once daily   semaglutide (OZEMPIC) pen injector Inject subcutaneously   tiZANidine (ZANAFLEX) 4 MG capsule Take by mouth   valACYclovir (VALTREX) 500 MG tablet TAKE 1 TABLET BY MOUTH TWICE DAILY FOR 5 DAYS AT TIME OF OUTBREAK   aspirin 81  MG EC tablet Take 81 mg by mouth once daily   chlorpheniramine (CHLOR-TRIMETON) 4 mg tablet Take by mouth   No current facility-administered medications on file prior to visit.   Family History  Problem Relation Age of Onset   Hyperlipidemia (Elevated cholesterol) Mother   Obesity Mother   High blood pressure (Hypertension) Mother   Diabetes Mother   Cancer Mother   Hyperlipidemia (Elevated cholesterol) Father   Obesity Father    Diabetes Father   High blood pressure (Hypertension) Father   Heart disease Father   Obesity Sister   Hyperlipidemia (Elevated cholesterol) Sister   Breast cancer Sister   Diabetes Sister   Kidney cancer Sister   Obesity Son    Social History   Tobacco Use  Smoking Status Former Smoker   Types: Cigarettes  Smokeless Tobacco Never Used  Tobacco Comment  Quit smoking 35 years ago per pt    Social History   Socioeconomic History   Marital status: Unknown  Tobacco Use   Smoking status: Former Smoker  Types: Cigarettes   Smokeless tobacco: Never Used   Tobacco comment: Quit smoking 35 years ago per pt  Vaping Use   Vaping Use: Never used  Substance and Sexual Activity   Alcohol use: Never   Drug use: Never   Objective:   There were no vitals filed for this visit.  There is no height or weight on file to calculate BMI.  Physical Exam Vitals reviewed.  Constitutional:  General: She is not in acute distress. Appearance: Normal appearance.  HENT:  Head: Normocephalic and atraumatic.  Right Ear: External ear normal.  Left Ear: External ear normal.  Nose: Nose normal.  Mouth/Throat:  Mouth: Mucous membranes are moist.  Pharynx: Oropharynx is clear.  Eyes:  General: No scleral icterus. Extraocular Movements: Extraocular movements intact.  Conjunctiva/sclera: Conjunctivae normal.  Pupils: Pupils are equal, round, and reactive to light.  Cardiovascular:  Rate and Rhythm: Normal rate and regular rhythm.  Pulses: Normal pulses.  Heart sounds: Normal heart sounds.  Pulmonary:  Effort: Pulmonary effort is normal. No respiratory distress.  Breath sounds: Normal breath sounds.  Abdominal:  General: Bowel sounds are normal.  Palpations: Abdomen is soft.  Tenderness: There is no abdominal tenderness.  Musculoskeletal:  General: No swelling, tenderness or deformity. Normal range of motion.  Cervical back: Normal range of motion and neck supple.  Skin: General: Skin  is warm and dry.  Coloration: Skin is not jaundiced.  Neurological:  General: No focal deficit present.  Mental Status: She is alert and oriented to person, place, and time.  Psychiatric:  Mood and Affect: Mood normal.  Behavior: Behavior normal.   Breast: There is no palpable mass in either breast. There is no palpable axillary, supraclavicular, or cervical lymphadenopathy.  Labs, Imaging and Diagnostic Testing:  Assessment and Plan:  Diagnoses and all orders for this visit:  Malignant neoplasm of upper-outer quadrant of right breast in female, estrogen receptor positive (CMS-HCC) - [facility] Case Posting Request; Future    The patient appears to have a small stage I cancer in the upper outer quadrant of the right breast with clinically negative nodes. I have discussed with her in detail the different options for treatment and at this point she is a little undecided between mastectomy and lumpectomy although I think she is going to favor breast conservation. She will also be a good candidate for sentinel node mapping. I have discussed with her in detail the risks and benefits of the  operations as well as some of the technical aspects and she understands. She will meet with medical and radiation oncology to discuss adjuvant therapy. She will let us know once she has made her final decision.

## 2021-03-12 NOTE — Progress Notes (Signed)
50 mcg Fentanyl wasted in Stericycle with Roxan Diesel, RN. Patient already discharged from pyxis.

## 2021-03-12 NOTE — Anesthesia Procedure Notes (Signed)
Anesthesia Regional Block: Pectoralis block   Pre-Anesthetic Checklist: , timeout performed,  Correct Patient, Correct Site, Correct Laterality,  Correct Procedure, Correct Position, site marked,  Risks and benefits discussed,  Surgical consent,  Pre-op evaluation,  At surgeon's request and post-op pain management  Laterality: Right  Prep: chloraprep       Needles:  Injection technique: Single-shot  Needle Type: Echogenic Needle     Needle Length: 10cm  Needle Gauge: 21     Additional Needles:   Narrative:  Start time: 03/12/2021 10:00 AM End time: 03/12/2021 10:03 AM Injection made incrementally with aspirations every 5 mL.  Performed by: Personally  Anesthesiologist: Audry Pili, MD  Additional Notes: No pain on injection. No increased resistance to injection. Injection made in 5cc increments. Good needle visualization. Patient tolerated the procedure well.

## 2021-03-12 NOTE — Interval H&P Note (Signed)
History and Physical Interval Note:  03/12/2021 10:28 AM  [name]  has presented today for surgery, with the diagnosis of RIGHT BREAST CANCER.  The various methods of treatment have been discussed with the patient and family. After consideration of risks, benefits and other options for treatment, the patient has consented to  Procedure(s): RIGHT BREAST LUMPECTOMY WITH RADIOACTIVE SEED AND SENTINEL LYMPH NODE BIOPSY (Right) as a surgical intervention.  The patient's history has been reviewed, patient examined, no change in status, stable for surgery.  I have reviewed the patient's chart and labs.  Questions were answered to the patient's satisfaction.     Autumn Messing III

## 2021-03-12 NOTE — Anesthesia Procedure Notes (Addendum)
Procedure Name: LMA Insertion Date/Time: 03/12/2021 11:02 AM Performed by: Lieutenant Diego, CRNA Pre-anesthesia Checklist: Patient identified, Emergency Drugs available, Suction available and Patient being monitored Patient Re-evaluated:Patient Re-evaluated prior to induction Oxygen Delivery Method: Circle system utilized Preoxygenation: Pre-oxygenation with 100% oxygen Induction Type: IV induction Ventilation: Mask ventilation without difficulty LMA: LMA flexible inserted LMA Size: 4.0 Number of attempts: 1 Placement Confirmation: positive ETCO2 and breath sounds checked- equal and bilateral Tube secured with: Tape Dental Injury: Teeth and Oropharynx as per pre-operative assessment

## 2021-03-12 NOTE — Anesthesia Preprocedure Evaluation (Addendum)
Anesthesia Evaluation  Patient identified by MRN, date of birth, ID band Patient awake    Reviewed: Allergy & Precautions, NPO status , Patient's Chart, lab work & pertinent test results  History of Anesthesia Complications Negative for: history of anesthetic complications  Airway Mallampati: III  TM Distance: >3 FB Neck ROM: Full    Dental  (+) Dental Advisory Given, Teeth Intact   Pulmonary asthma , sleep apnea and Continuous Positive Airway Pressure Ventilation , COPD,  COPD inhaler, former smoker,    Pulmonary exam normal        Cardiovascular hypertension, Pt. on medications Normal cardiovascular exam     Neuro/Psych  Neuromuscular disease negative psych ROS   GI/Hepatic Neg liver ROS, GERD  Medicated and Controlled,(+)       cocaine use,   Endo/Other  diabetes, Type 2, Insulin Dependent, Oral Hypoglycemic AgentsMorbid obesity  Renal/GU negative Renal ROS  Female GU complaint     Musculoskeletal  (+) Arthritis , narcotic dependent Chronic back pain    Abdominal   Peds  Hematology negative hematology ROS (+)   Anesthesia Other Findings   Reproductive/Obstetrics  Breast cancer                             Anesthesia Physical Anesthesia Plan  ASA: 3  Anesthesia Plan: General   Post-op Pain Management:  Regional for Post-op pain   Induction: Intravenous  PONV Risk Score and Plan: 4 or greater and Treatment may vary due to age or medical condition, Ondansetron, Midazolam, Dexamethasone and Scopolamine patch - Pre-op  Airway Management Planned: LMA  Additional Equipment: None  Intra-op Plan:   Post-operative Plan: Extubation in OR  Informed Consent: I have reviewed the patients History and Physical, chart, labs and discussed the procedure including the risks, benefits and alternatives for the proposed anesthesia with the patient or authorized representative who has  indicated his/her understanding and acceptance.     Dental advisory given  Plan Discussed with: CRNA and Anesthesiologist  Anesthesia Plan Comments:        Anesthesia Quick Evaluation

## 2021-03-13 ENCOUNTER — Encounter (HOSPITAL_COMMUNITY): Payer: Self-pay | Admitting: General Surgery

## 2021-03-15 LAB — SURGICAL PATHOLOGY

## 2021-03-17 ENCOUNTER — Encounter: Payer: Self-pay | Admitting: *Deleted

## 2021-03-17 ENCOUNTER — Telehealth: Payer: Self-pay | Admitting: *Deleted

## 2021-03-17 NOTE — Telephone Encounter (Signed)
Ordered oncotype per Dr.Gudena. Faxed requisition to pathology and [facility] [facility].

## 2021-03-22 ENCOUNTER — Encounter: Payer: Self-pay | Admitting: *Deleted

## 2021-03-22 ENCOUNTER — Ambulatory Visit: Payer: PPO | Admitting: Hematology and Oncology

## 2021-03-22 NOTE — Progress Notes (Signed)
Patient Care Team: Horald Pollen, MD as PCP - General (Internal Medicine) Shamleffer, Melanie Crazier, MD as Consulting Physician (Endocrinology) [facility] Deneise Lever, MD as Consulting Physician (Pulmonary Disease) Trula Slade, DPM as Consulting Physician (Podiatry) Ladene Artist, MD as Consulting Physician (Gastroenterology) Jovita Kussmaul, MD as Consulting Physician (General Surgery) Nicholas Lose, MD as Consulting Physician (Hematology and Oncology) Kyung Rudd, MD as Consulting Physician (Radiation Oncology) Mauro Kaufmann, RN as Oncology Nurse Navigator Rockwell Germany, RN as Oncology Nurse Navigator  DIAGNOSIS:    ICD-10-CM   1. Malignant neoplasm of upper-outer quadrant of right breast in female, estrogen receptor positive ([place])  C50.411    Z17.0       SUMMARY OF ONCOLOGIC HISTORY: Oncology History  Malignant neoplasm of upper-outer quadrant of right breast in female, estrogen receptor positive ([place])  02/16/2021 Initial Diagnosis   Screening mammogram: possible asymmetry in the right breast. Diagnostic mammogram and [place]: 0.7 cm suspicious mass in the right breast at 10:00. 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%), HER2 negative by FISH, Ki-67 5%   02/24/2021 Cancer Staging   Staging form: Breast, AJCC 8th Edition - Clinical stage from 02/24/2021: Stage IA (cT1b, cN0, cM0, G1, ER+, PR+, HER2-) - Signed by Nicholas Lose, MD on 02/24/2021 Stage prefix: Initial diagnosis Histologic grading system: 3 grade system   03/12/2021 Surgery   Right lumpectomy: Grade 1 IDC 1.2 cm, low-grade DCIS, margins negative, 0/5 lymph nodes negative ER greater than 95%, PR 40%, HER2 negative, Ki-67 5%     CHIEF COMPLIANT: Follow-up of right breast cancer  INTERVAL HISTORY: [name] is a 60 y.o. with above-mentioned history of right breast cancer. 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 She presents to the clinic today for follow-up.   ALLERGIES:  is allergic to lisinopril.  MEDICATIONS:  Current Outpatient Medications  Medication Sig Dispense Refill   acyclovir (ZOVIRAX) 400 MG tablet TAKE 1 TABLET BY MOUTH THREE TIMES DAILY AS NEEDED (FOR  FLARES) (Patient taking differently: Take 400 mg by mouth 2 (two) times daily as needed (flares).) 90 tablet 0   albuterol (VENTOLIN HFA) 108 (90 Base) MCG/ACT inhaler Inhale 2 puffs into the lungs every 6 (six) hours as needed for wheezing or shortness of breath. 18 g 12   azithromycin (ZITHROMAX) 250 MG tablet 2 today then one daily (Patient not taking: Reported on 03/03/2021) 6 tablet 0   BELBUCA 450 MCG FILM Take 450 mcg by mouth 2 (two) times daily as needed (pain).     Blood Glucose Monitoring Suppl (ONETOUCH VERIO FLEX SYSTEM) w/Device KIT 1 each by Does not apply route 3 (three) times daily. 1 kit 1   budesonide-formoterol (SYMBICORT) 80-4.5 MCG/ACT inhaler Inhale 2 puffs then rinse mouth, twice daily- maintenance 10.2 g 12   chlorpheniramine (CHLOR-TRIMETON) 4 MG tablet Take 8 mg by mouth in the morning and at bedtime.     Continuous Blood Gluc Sensor (FREESTYLE LIBRE 2 SENSOR) MISC USE TO CHECK BLOOD SUGARS FOUR TIMES DAILY. 2 each 0   Continuous Glucose Monitor DEVI Use as directed. 1 each 0   dapagliflozin propanediol (FARXIGA) 10 MG TABS tablet Take 1 tablet (10 mg total) by mouth daily. 90 tablet 3   diclofenac Sodium (VOLTAREN) 1 % GEL Apply 2 g topically 4 (four) times daily. Rub into affected area of foot 2 to 4  times daily (Patient taking differently: Apply 2 g topically 4 (four) times daily as needed (foot pain).) 100 g 2   dicyclomine (BENTYL) 10 MG capsule TAKE 1 CAPSULE BY MOUTH THREE TIMES DAILY BEFORE MEAL(S) (Patient taking differently: Take 10 mg by mouth in the morning and at bedtime.) 30 capsule 0   esomeprazole (NEXIUM) 40 MG capsule Take 1 capsule (40 mg  total) by mouth 2 (two) times daily before a meal. 60 capsule 5   ezetimibe (ZETIA) 10 MG tablet Take 1 tablet (10 mg total) by mouth daily. 30 tablet 11   gabapentin (NEURONTIN) 600 MG tablet Take 600 mg by mouth 2 (two) times daily.     hydrochlorothiazide (HYDRODIURIL) 25 MG tablet Take 1 tablet by mouth once daily 90 tablet 1   HYDROcodone-acetaminophen (NORCO/VICODIN) 5-325 MG tablet Take 1 tablet by mouth every 6 (six) hours as needed for moderate pain or severe pain. 15 tablet 0   insulin aspart protamine - aspart (NOVOLOG 70/30 FLEXPEN) (70-30) 100 UNIT/ML FlexPen Inject 90 Units into the skin daily with breakfast AND 89 Units daily with supper. Inject 80 units with Breakfast and 80 units with Supper. (Patient taking differently: Inject 90 Units into the skin daily with breakfast AND 89 Units daily with supper.) 60 mL 3   Insulin Disposable Pump (OMNIPOD DASH INTRO, GEN 4,) KIT 1 Device by Does not apply route every 3 (three) days. 1 kit 0   Insulin Disposable Pump (OMNIPOD DASH PODS, GEN 4,) MISC 1 Device by Does not apply route every 3 (three) days. 6 each 3   Insulin Pen Needle 32G X 4 MM MISC Use to inject insulin two times a day 200 each 3   Insulin Syringe-Needle U-100 (INSULIN SYRINGE 1CC/30GX1/2") 30G X 1/2" 1 ML MISC Use to fill Vgo daily 100 each 5   irbesartan (AVAPRO) 300 MG tablet Take 1 tablet (300 mg total) by mouth daily. 90 tablet 0   metFORMIN (GLUCOPHAGE) 1000 MG tablet TAKE 1 TABLET BY MOUTH TWICE DAILY WITH  A  MEAL 180 tablet 0   montelukast (SINGULAIR) 10 MG tablet Take 1 tablet (10 mg total) by mouth at bedtime. 30 tablet 11   Multiple Vitamin (MULTIVITAMIN WITH MINERALS) TABS tablet Take 1 tablet by mouth daily.     NARCAN 4 MG/0.1ML LIQD nasal spray kit Place 0.4 mg into the nose once.     nystatin (MYCOSTATIN) 100000 UNIT/ML suspension Use 4-6 cc four times daily and continue for 48 hours after symptoms clear (Patient not taking: Reported on 03/03/2021) 240 mL 0    ONETOUCH DELICA LANCETS FINE MISC Check blood sugar 3 times a day 100 each 12   ONETOUCH VERIO test strip  CHECK BLOOD SUGAR 3 TIMES A DAY 100 each 5   oxyCODONE-acetaminophen (PERCOCET) 10-325 MG tablet Take 1 tablet by mouth 4 (four) times daily as needed for pain.     rosuvastatin (CRESTOR) 10 MG tablet Take 1 tablet (10 mg total) by mouth daily. 90 tablet 3   Semaglutide, 2 MG/DOSE, (OZEMPIC, 2 MG/DOSE,) 8 MG/3ML SOPN Inject 2 mg into the skin once a week. 9 mL 3   valACYclovir (VALTREX) 500 MG tablet TAKE 1 TABLET BY MOUTH TWICE DAILY FOR 5 DAYS AT  TIME  OF  OUTBREAK 10 tablet 0   No current facility-administered medications for this visit.    PHYSICAL EXAMINATION: ECOG PERFORMANCE STATUS: 1 - Symptomatic but completely ambulatory  Vitals:   03/23/21 1137  BP: 137/67  Pulse:  95  Resp: 17  Temp: (!) 97.2 F (36.2 C)  SpO2: 100%   Filed Weights   03/23/21 1137  Weight: 242 lb 12.8 oz (110.1 kg)      LABORATORY DATA:  I have reviewed the data as listed CMP Latest Ref Rng & Units 02/24/2021 02/19/2021 11/06/2020  Glucose 70 - 99 mg/dL 263(H) 175(H) 154(H)  BUN 6 - 20 mg/dL _0 Creatinine 0.44 - 1.00 mg/dL 0.96 0.71 0.68  Sodium 135 - 145 mmol/L 138 139 139  Potassium 3.5 - 5.1 mmol/L 3.9 4.0 3.8  Chloride 98 - 111 mmol/L 96(L) 98 100  CO2 22 - 32 mmol/L _1 Calcium 8.9 - 10.3 mg/dL 10.3 9.6 9.9  Total Protein 6.5 - 8.1 g/dL 8.3(H) - -  Total Bilirubin 0.3 - 1.2 mg/dL 0.4 - -  Alkaline Phos 38 - 126 U/L 63 - -  AST 15 - 41 U/L 27 - -  ALT 0 - 44 U/L 30 - -    Lab Results  Component Value Date   WBC 7.9 02/24/2021   HGB 15.2 (H) 02/24/2021   HCT 47.4 (H) 02/24/2021   MCV 85.1 02/24/2021   PLT 269 02/24/2021   NEUTROABS 2.9 02/24/2021    ASSESSMENT & PLAN:  Malignant neoplasm of upper-outer quadrant of right breast in female, estrogen receptor positive ([place]) 02/16/2021:Screening mammogram: possible asymmetry in the right breast. Diagnostic mammogram and  [place]: 0.7 cm suspicious mass in the right breast at 10:00. 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%), HER2 negative by FISH, Ki-67 5%   03/12/2021:Right lumpectomy: Grade 1 IDC 1.2 cm, low-grade DCIS, margins negative, 0/5 lymph nodes negative ER greater than 95%, PR 40%, HER2 negative, Ki-67 5%  Pathology counseling: I discussed the final pathology report of the patient provided  a copy of this report. I discussed the margins as well as lymph node surgeries. We also discussed the final staging along with previously performed ER/PR and HER-2/neu testing.  Treatment plan: 1.  Oncotype DX testing to determine if she would benefit from chemotherapy 2. adjuvant radiation 3.  Adjuvant antiestrogen therapy  Return to clinic based upon Oncotype DX test result    No orders of the defined types were placed in this encounter.  The patient has a good understanding of the overall plan. she agrees with it. she will call with any problems that may develop before the next visit here.  Total time spent: 20 mins including face to face time and time spent for planning, charting and coordination of care  Rulon Eisenmenger, MD, MPH 03/23/2021  I, Thana Ates, am acting as scribe for Dr. Nicholas Lose.  I have reviewed the above documentation for accuracy and completeness, and I agree with the above.

## 2021-03-23 ENCOUNTER — Inpatient Hospital Stay: Payer: PPO | Attending: Hematology and Oncology | Admitting: Hematology and Oncology

## 2021-03-23 ENCOUNTER — Other Ambulatory Visit: Payer: Self-pay

## 2021-03-23 DIAGNOSIS — Z170 Estrogen receptor positive status [ER+]: Secondary | ICD-10-CM | POA: Diagnosis not present

## 2021-03-23 DIAGNOSIS — C50411 Malignant neoplasm of upper-outer quadrant of right female breast: Secondary | ICD-10-CM | POA: Diagnosis not present

## 2021-03-23 NOTE — Assessment & Plan Note (Signed)
02/16/2021:Screening mammogram: possible asymmetry in the right breast. Diagnostic mammogram and [place]: 0.7 cm suspicious mass in the right breast at 10:00. 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, low-grade DCIS, margins negative, 0/5 lymph nodes negative ER greater than 95%, PR 40%, HER2 negative, Ki-67 5%  Pathology counseling: I discussed the final pathology report of the patient provided  a copy of this report. I discussed the margins as well as lymph node surgeries. We also discussed the final staging along with previously performed ER/PR and HER-2/neu testing.  Treatment plan: 1.  Oncotype DX testing to determine if she would benefit from chemotherapy 2. adjuvant radiation 3.  Adjuvant antiestrogen therapy  Return to clinic based upon Oncotype DX test result

## 2021-03-25 ENCOUNTER — Other Ambulatory Visit: Payer: Self-pay | Admitting: Student

## 2021-03-25 ENCOUNTER — Encounter (HOSPITAL_COMMUNITY): Payer: Self-pay

## 2021-03-25 DIAGNOSIS — M545 Low back pain, unspecified: Secondary | ICD-10-CM | POA: Diagnosis not present

## 2021-03-25 DIAGNOSIS — E119 Type 2 diabetes mellitus without complications: Secondary | ICD-10-CM | POA: Diagnosis not present

## 2021-03-25 DIAGNOSIS — Z6841 Body Mass Index (BMI) 40.0 and over, adult: Secondary | ICD-10-CM | POA: Diagnosis not present

## 2021-03-25 DIAGNOSIS — M542 Cervicalgia: Secondary | ICD-10-CM | POA: Diagnosis not present

## 2021-03-25 DIAGNOSIS — Z79899 Other long term (current) drug therapy: Secondary | ICD-10-CM | POA: Diagnosis not present

## 2021-03-25 DIAGNOSIS — I10 Essential (primary) hypertension: Secondary | ICD-10-CM | POA: Diagnosis not present

## 2021-03-25 DIAGNOSIS — Z170 Estrogen receptor positive status [ER+]: Secondary | ICD-10-CM | POA: Diagnosis not present

## 2021-03-25 DIAGNOSIS — Z794 Long term (current) use of insulin: Secondary | ICD-10-CM

## 2021-03-25 DIAGNOSIS — M546 Pain in thoracic spine: Secondary | ICD-10-CM | POA: Diagnosis not present

## 2021-03-25 DIAGNOSIS — C50411 Malignant neoplasm of upper-outer quadrant of right female breast: Secondary | ICD-10-CM | POA: Diagnosis not present

## 2021-03-25 DIAGNOSIS — E1140 Type 2 diabetes mellitus with diabetic neuropathy, unspecified: Secondary | ICD-10-CM

## 2021-03-25 DIAGNOSIS — E559 Vitamin D deficiency, unspecified: Secondary | ICD-10-CM | POA: Diagnosis not present

## 2021-03-27 ENCOUNTER — Other Ambulatory Visit: Payer: Self-pay | Admitting: Gastroenterology

## 2021-03-27 DIAGNOSIS — R1013 Epigastric pain: Secondary | ICD-10-CM

## 2021-03-27 DIAGNOSIS — K2970 Gastritis, unspecified, without bleeding: Secondary | ICD-10-CM

## 2021-03-29 ENCOUNTER — Telehealth: Payer: Self-pay | Admitting: *Deleted

## 2021-03-29 DIAGNOSIS — E1140 Type 2 diabetes mellitus with diabetic neuropathy, unspecified: Secondary | ICD-10-CM | POA: Diagnosis not present

## 2021-03-29 NOTE — Telephone Encounter (Signed)
Received oncotype score of 27. Physician team notified. Called pt, left vm on verified vm with appt date and time of 10/18 at 2:15. Request return call. Contact information provided.

## 2021-03-29 NOTE — Telephone Encounter (Signed)
Spoke to pt, confirmed appt with Dr. Lindi Adie on 10/18 at 2:15pm.

## 2021-03-29 NOTE — Progress Notes (Signed)
Patient Care Team: Horald Pollen, MD as PCP - General (Internal Medicine) Shamleffer, Melanie Crazier, MD as Consulting Physician (Endocrinology) [facility] Deneise Lever, MD as Consulting Physician (Pulmonary Disease) Trula Slade, DPM as Consulting Physician (Podiatry) Ladene Artist, MD as Consulting Physician (Gastroenterology) Jovita Kussmaul, MD as Consulting Physician (General Surgery) Nicholas Lose, MD as Consulting Physician (Hematology and Oncology) Kyung Rudd, MD as Consulting Physician (Radiation Oncology) Mauro Kaufmann, RN as Oncology Nurse Navigator Rockwell Germany, RN as Oncology Nurse Navigator  DIAGNOSIS:    ICD-10-CM   1. Malignant neoplasm of upper-outer quadrant of right breast in female, estrogen receptor positive ([place])  C50.411    Z17.0       SUMMARY OF ONCOLOGIC HISTORY: Oncology History  Malignant neoplasm of upper-outer quadrant of right breast in female, estrogen receptor positive ([place])  02/16/2021 Initial Diagnosis   Screening mammogram: possible asymmetry in the right breast. Diagnostic mammogram and [place]: 0.7 cm suspicious mass in the right breast at 10:00. 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%), HER2 negative by FISH, Ki-67 5%   02/24/2021 Cancer Staging   Staging form: Breast, AJCC 8th Edition - Clinical stage from 02/24/2021: Stage IA (cT1b, cN0, cM0, G1, ER+, PR+, HER2-) - Signed by Nicholas Lose, MD on 02/24/2021 Stage prefix: Initial diagnosis Histologic grading system: 3 grade system   03/12/2021 Surgery   Right lumpectomy: Grade 1 IDC 1.2 cm, low-grade DCIS, margins negative, 0/5 lymph nodes negative ER greater than 95%, PR 40%, HER2 negative, Ki-67 5%   03/25/2021 Oncotype testing   Oncotype DX recurrence score: 27, distant recurrence at 9 years: 16%     CHIEF COMPLIANT: Follow-up of right breast cancer  INTERVAL HISTORY: [name] is a 60 y.o. with above-mentioned history of right breast  cancer having undergone lumpectomy. She presents to the clinic today for follow-up.  She is here to discuss results of Oncotype DX testing and to discuss about the risks and benefits of chemotherapy.  She is accompanied by her husband today.  ALLERGIES:  is allergic to lisinopril.  MEDICATIONS:  Current Outpatient Medications  Medication Sig Dispense Refill   acyclovir (ZOVIRAX) 400 MG tablet TAKE 1 TABLET BY MOUTH THREE TIMES DAILY AS NEEDED (FOR  FLARES) (Patient taking differently: Take 400 mg by mouth 2 (two) times daily as needed (flares).) 90 tablet 0   albuterol (VENTOLIN HFA) 108 (90 Base) MCG/ACT inhaler Inhale 2 puffs into the lungs every 6 (six) hours as needed for wheezing or shortness of breath. 18 g 12   azithromycin (ZITHROMAX) 250 MG tablet 2 today then one daily (Patient not taking: Reported on 03/03/2021) 6 tablet 0   BELBUCA 450 MCG FILM Take 450 mcg by mouth 2 (two) times daily as needed (pain).     Blood Glucose Monitoring Suppl (ONETOUCH VERIO FLEX SYSTEM) w/Device KIT 1 each by Does not apply route 3 (three) times daily. 1 kit 1   budesonide-formoterol (SYMBICORT) 80-4.5 MCG/ACT inhaler Inhale 2 puffs then rinse mouth, twice daily- maintenance 10.2 g 12   chlorpheniramine (CHLOR-TRIMETON) 4 MG tablet Take 8 mg by mouth in the morning and at bedtime.     Continuous Blood Gluc Sensor (FREESTYLE LIBRE 2 SENSOR) MISC USE TO CHECK BLOOD SUGARS FOUR TIMES DAILY. 2 each 0   Continuous Glucose Monitor DEVI Use as directed. 1 each 0   dapagliflozin propanediol (FARXIGA) 10 MG TABS tablet Take 1 tablet (10 mg total) by mouth daily. 90 tablet  3   diclofenac Sodium (VOLTAREN) 1 % GEL Apply 2 g topically 4 (four) times daily. Rub into affected area of foot 2 to 4 times daily (Patient taking differently: Apply 2 g topically 4 (four) times daily as needed (foot pain).) 100 g 2   dicyclomine (BENTYL) 10 MG capsule TAKE 1 CAPSULE BY MOUTH THREE TIMES DAILY BEFORE MEAL(S) (Patient taking  differently: Take 10 mg by mouth in the morning and at bedtime.) 30 capsule 0   esomeprazole (NEXIUM) 40 MG capsule Take 1 capsule (40 mg total) by mouth 2 (two) times daily before a meal. 60 capsule 5   ezetimibe (ZETIA) 10 MG tablet Take 1 tablet (10 mg total) by mouth daily. 30 tablet 11   gabapentin (NEURONTIN) 600 MG tablet Take 600 mg by mouth 2 (two) times daily.     hydrochlorothiazide (HYDRODIURIL) 25 MG tablet Take 1 tablet by mouth once daily 90 tablet 1   HYDROcodone-acetaminophen (NORCO/VICODIN) 5-325 MG tablet Take 1 tablet by mouth every 6 (six) hours as needed for moderate pain or severe pain. 15 tablet 0   insulin aspart protamine - aspart (NOVOLOG 70/30 FLEXPEN) (70-30) 100 UNIT/ML FlexPen Inject 90 Units into the skin daily with breakfast AND 89 Units daily with supper. Inject 80 units with Breakfast and 80 units with Supper. (Patient taking differently: Inject 90 Units into the skin daily with breakfast AND 89 Units daily with supper.) 60 mL 3   Insulin Disposable Pump (OMNIPOD DASH INTRO, GEN 4,) KIT 1 Device by Does not apply route every 3 (three) days. 1 kit 0   Insulin Disposable Pump (OMNIPOD DASH PODS, GEN 4,) MISC 1 Device by Does not apply route every 3 (three) days. 6 each 3   Insulin Pen Needle 32G X 4 MM MISC Use to inject insulin two times a day 200 each 3   Insulin Syringe-Needle U-100 (INSULIN SYRINGE 1CC/30GX1/2") 30G X 1/2" 1 ML MISC Use to fill Vgo daily 100 each 5   irbesartan (AVAPRO) 300 MG tablet Take 1 tablet (300 mg total) by mouth daily. 90 tablet 0   metFORMIN (GLUCOPHAGE) 1000 MG tablet TAKE 1 TABLET BY MOUTH TWICE DAILY WITH  A  MEAL 180 tablet 0   montelukast (SINGULAIR) 10 MG tablet Take 1 tablet (10 mg total) by mouth at bedtime. 30 tablet 11   Multiple Vitamin (MULTIVITAMIN WITH MINERALS) TABS tablet Take 1 tablet by mouth daily.     NARCAN 4 MG/0.1ML LIQD nasal spray kit Place 0.4 mg into the nose once.     nystatin (MYCOSTATIN) 100000 UNIT/ML  suspension Use 4-6 cc four times daily and continue for 48 hours after symptoms clear (Patient not taking: Reported on 03/03/2021) 240 mL 0   ONETOUCH DELICA LANCETS FINE MISC Check blood sugar 3 times a day 100 each 12   ONETOUCH VERIO test strip  CHECK BLOOD SUGAR 3 TIMES A DAY 100 each 5   oxyCODONE-acetaminophen (PERCOCET) 10-325 MG tablet Take 1 tablet by mouth 4 (four) times daily as needed for pain.     rosuvastatin (CRESTOR) 10 MG tablet Take 1 tablet (10 mg total) by mouth daily. 90 tablet 3   Semaglutide, 2 MG/DOSE, (OZEMPIC, 2 MG/DOSE,) 8 MG/3ML SOPN Inject 2 mg into the skin once a week. 9 mL 3   valACYclovir (VALTREX) 500 MG tablet TAKE 1 TABLET BY MOUTH TWICE DAILY FOR 5 DAYS AT  TIME  OF  OUTBREAK 10 tablet 0   No current facility-administered medications for this  visit.    PHYSICAL EXAMINATION: ECOG PERFORMANCE STATUS: 1 - Symptomatic but completely ambulatory  Vitals:   03/30/21 1413  BP: (!) 145/68  Pulse: 95  Resp: 19  Temp: 97.8 F (36.6 C)  SpO2: 97%   Filed Weights   03/30/21 1413  Weight: 243 lb (110.2 kg)     LABORATORY DATA:  I have reviewed the data as listed CMP Latest Ref Rng & Units 02/24/2021 02/19/2021 11/06/2020  Glucose 70 - 99 mg/dL 263(H) 175(H) 154(H)  BUN 6 - 20 mg/dL 11 12 12   Creatinine 0.44 - 1.00 mg/dL 0.96 0.71 0.68  Sodium 135 - 145 mmol/L 138 139 139  Potassium 3.5 - 5.1 mmol/L 3.9 4.0 3.8  Chloride 98 - 111 mmol/L 96(L) 98 100  CO2 22 - 32 mmol/L 27 31 29   Calcium 8.9 - 10.3 mg/dL 10.3 9.6 9.9  Total Protein 6.5 - 8.1 g/dL 8.3(H) - -  Total Bilirubin 0.3 - 1.2 mg/dL 0.4 - -  Alkaline Phos 38 - 126 U/L 63 - -  AST 15 - 41 U/L 27 - -  ALT 0 - 44 U/L 30 - -    Lab Results  Component Value Date   WBC 7.9 02/24/2021   HGB 15.2 (H) 02/24/2021   HCT 47.4 (H) 02/24/2021   MCV 85.1 02/24/2021   PLT 269 02/24/2021   NEUTROABS 2.9 02/24/2021    ASSESSMENT & PLAN:  Malignant neoplasm of upper-outer quadrant of right breast in  female, estrogen receptor positive ([place]) 02/16/2021:Screening mammogram: possible asymmetry in the right breast. Diagnostic mammogram and [place]: 0.7 cm suspicious mass in the right breast at 10:00. 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%), HER2 negative by FISH, Ki-67 5%   03/12/2021:Right lumpectomy: Grade 1 IDC 1.2 cm, low-grade DCIS, margins negative, 0/5 lymph nodes negative ER greater than 95%, PR 40%, HER2 negative, Ki-67 5%  Oncotype DX recurrence score: 27, distant recurrence at 9 years: 16%  Treatment plan: 1.  Adjuvant chemotherapy with Taxotere and Cytoxan every 3 weeks x4 cycles 2. adjuvant radiation 3.  Followed by adjuvant antiestrogen therapy  Chemotherapy Counseling: I discussed the risks and benefits of chemotherapy including the risks of nausea/ vomiting, risk of infection from low WBC count, fatigue due to chemo or anemia, bruising or bleeding due to low platelets, mouth sores, loss/ change in taste and decreased appetite. Liver and kidney function will be monitored through out chemotherapy as abnormalities in liver and kidney function may be a side effect of treatment. Cardiac dysfunction due to Adriamycin and neuropathy risk from Taxol were discussed in detail. Risk of permanent bone marrow dysfunction and leukemia due to chemo were also discussed.   Patient wants to think about chemotherapy and inform us of her decision. We will contact her to our financial advocates to discuss the cost of care.   No orders of the defined types were placed in this encounter.  The patient has a good understanding of the overall plan. she agrees with it. she will call with any problems that may develop before the next visit here.  Total time spent: 30 mins including face to face time and time spent for planning, charting and coordination of care  Rulon Eisenmenger, MD, MPH 03/30/2021  I, Thana Ates, am acting as scribe for Dr. Nicholas Lose.  I have reviewed the above documentation  for accuracy and completeness, and I agree with the above.

## 2021-03-30 ENCOUNTER — Encounter: Payer: Self-pay | Admitting: Hematology and Oncology

## 2021-03-30 ENCOUNTER — Inpatient Hospital Stay: Payer: PPO | Admitting: Hematology and Oncology

## 2021-03-30 ENCOUNTER — Other Ambulatory Visit: Payer: Self-pay

## 2021-03-30 ENCOUNTER — Telehealth: Payer: Self-pay | Admitting: Gastroenterology

## 2021-03-30 DIAGNOSIS — K2970 Gastritis, unspecified, without bleeding: Secondary | ICD-10-CM

## 2021-03-30 DIAGNOSIS — Z170 Estrogen receptor positive status [ER+]: Secondary | ICD-10-CM

## 2021-03-30 DIAGNOSIS — R1013 Epigastric pain: Secondary | ICD-10-CM

## 2021-03-30 DIAGNOSIS — C50411 Malignant neoplasm of upper-outer quadrant of right female breast: Secondary | ICD-10-CM

## 2021-03-30 MED ORDER — DICYCLOMINE HCL 10 MG PO CAPS: ORAL_CAPSULE | ORAL | 0 refills | Status: DC

## 2021-03-30 NOTE — Assessment & Plan Note (Signed)
02/16/2021:Screening mammogram: possible asymmetry in the right breast. Diagnostic mammogram and [place]: 0.7 cm suspicious mass in the right breast at 10:00. 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  Adjuvant chemotherapy with Taxotere and Cytoxan every 3 weeks x4 cycles 2. adjuvant radiation 3.  Followed by adjuvant antiestrogen therapy  Chemotherapy Counseling: I discussed the risks and benefits of chemotherapy including the risks of nausea/ vomiting, risk of infection from low WBC count, fatigue due to chemo or anemia, bruising or bleeding due to low platelets, mouth sores, loss/ change in taste and decreased appetite. Liver and kidney function will be monitored through out chemotherapy as abnormalities in liver and kidney function may be a side effect of treatment. Cardiac dysfunction due to Adriamycin and neuropathy risk from Taxol were discussed in detail. Risk of permanent bone marrow dysfunction and leukemia due to chemo were also discussed.  Return to clinic to start chemo.

## 2021-03-30 NOTE — Progress Notes (Signed)
START ON PATHWAY REGIMEN - Breast     A cycle is every 21 days:     Docetaxel      Cyclophosphamide   **Always confirm dose/schedule in your pharmacy ordering system**  Patient Characteristics: Postoperative without Neoadjuvant Therapy (Pathologic Staging), Invasive Disease, Adjuvant Therapy, HER2 Negative/Unknown/Equivocal, ER Positive, Node Negative, pT1a-c, pN0/N48m or pT2 or Higher, pN0, Oncotype High Risk (? 26) Therapeutic Status: Postoperative without Neoadjuvant Therapy (Pathologic Staging) AJCC Grade: G1 AJCC N Category: pN0 AJCC M Category: cM0 ER Status: Positive (+) AJCC 8 Stage Grouping: IA HER2 Status: Negative (-) Oncotype Dx Recurrence Score: 27 AJCC T Category: pT1c PR Status: Positive (+) Adjuvant Therapy Status: No Adjuvant Therapy Received Yet or Changing Initial Adjuvant Regimen due to Tolerance Has this patient completed genomic testing<= Yes - Oncotype DX(R) Intent of Therapy: Curative Intent, Discussed with Patient

## 2021-03-30 NOTE — Progress Notes (Signed)
Called patient to introduce myself as Arboriculturist and to address concerns regarding her treatment.  Advised patient to contact insurance company to obtain information regarding where deductible and out of pocket amounts for her plan to date. This would give her an idea overall of the amount she would be expected to pay total after insurance has paid their portion. She verbalized understanding. Advised with her type of plan their may or may not be available copay assistance for her specific treatment drugs but may be other options such as drug assistance through pharmacy. She had questions regarding copay for each visit. Advised this would only be collected when she sees the doctor. She asked if she is able to take care of her bills and then unable to would her treatment end. Advised her she would be able to continue treatment regardless of inability to pay and she would be able to make arrangements with the billing department and options through them at that time.   Encouraged her to contact me with any additional questions or concerns and I would meet her in person to discuss further options once she has chemo ed class. She verbalized understanding and was very Patent attorney.

## 2021-03-30 NOTE — Telephone Encounter (Signed)
Inbound call from patient requesting medication refill for dicyclomine sent to [facility] at [facility]. Patient have appt scheduled for 11/4. She is also requesting a call back for confirm

## 2021-03-30 NOTE — Telephone Encounter (Signed)
Rx sent 

## 2021-03-31 ENCOUNTER — Telehealth: Payer: Self-pay | Admitting: *Deleted

## 2021-03-31 NOTE — Telephone Encounter (Signed)
Left message on patient's identified voicemail for a return phone call to get update regarding her treatment decision.

## 2021-03-31 NOTE — Progress Notes (Deleted)
Office Visit Note  Patient: [name]             Date of Birth: 1960-10-12           MRN: [id number]             PCP: Horald Pollen, MD Referring: Harvie Heck, MD Visit Date: 04/14/2021 Occupation: _0 @  Subjective:  No chief complaint on file.   History of Present Illness: [name] is a 60 y.o. female ***   Activities of Daily Living:  Patient reports morning stiffness for *** {minute/hour:19697}.   Patient {ACTIONS;DENIES/REPORTS:21021675::"Denies"} nocturnal pain.  Difficulty dressing/grooming: {ACTIONS;DENIES/REPORTS:21021675::"Denies"} Difficulty climbing stairs: {ACTIONS;DENIES/REPORTS:21021675::"Denies"} Difficulty getting out of chair: {ACTIONS;DENIES/REPORTS:21021675::"Denies"} Difficulty using hands for taps, buttons, cutlery, and/or writing: {ACTIONS;DENIES/REPORTS:21021675::"Denies"}  No Rheumatology ROS completed.   PMFS History:  Patient Active Problem List   Diagnosis Date Noted   Malignant neoplasm of upper-outer quadrant of right breast in female, estrogen receptor positive ([place]) 02/22/2021   Pre-operative respiratory examination 09/04/2020   Body mass index (BMI) 40.0-44.9, adult ([place]) 07/28/2020   Lumbago with sciatica, right side 07/28/2020   Elevated CK 07/11/2020   Dyspnea on exertion 07/11/2020   Fracture of base of fifth metatarsal bone of right foot at metaphyseal-diaphyseal junction with nonunion 05/07/2019   OSA on CPAP 10/31/2018   Stress incontinence in female 03/09/2017   Hepatic steatosis 08/29/2016   Herpes simplex 03/08/2016   Diabetic neuropathy with neurologic complication ([place]) 80/99/8338   Long term current use of opiate analgesic 07/21/2015   Preventative health care 05/21/2015   COPD GOLD II  05/31/2013   Lung nodule < 6cm on CT 05/31/2013   Chronic cough 12/07/2012   Chronic pain syndrome 02/13/2012   Lumbar radiculopathy 11/29/2011   Type 2 diabetes mellitus with diabetic neuropathy ([place]) 08/25/2011    Back pain 11/15/2010   Hypertension associated with diabetes ([place]) 07/07/2009   GERD 01/08/2009   Dyslipidemia associated with type 2 diabetes mellitus ([place]) 12/09/2008   Hyperlipidemia 12/09/2008   Morbid obesity due to excess calories ([place]) 12/09/2008    Past Medical History:  Diagnosis Date   Allergy    Arthritis    back    Asthma    AS CHILD   Chronic back pain    Chronic leg pain    due to back pain   Cocaine abuse ([place])    in remission   COPD (chronic obstructive pulmonary disease) ([place])    Diabetes ([place])    with neuropathy   GERD (gastroesophageal reflux disease)    Hyperlipidemia    Hypertension    Intraductal papilloma of left breast 02/18/2016   Neuromuscular disorder ([place])    neuropathy   Postlaminectomy syndrome of lumbar region 12/07/2011   RECTAL BLEEDING 12/09/2008   Annotation: s/p EGD 7/08 mild gastritis, s/p colonoscopy 7/08- benign polyp  s/p polypectomy and isolated diverticulum.  Qualifier: Diagnosis of  By: Ditzler RN, Debra     Sciatica    per patient    Sleep apnea    CPAP    YEARS AGO DONE 1/2 YEARS AGO AND WAS TOLD DID NOT HAVE   Tobacco abuse    Uterine fibroid    s/p hysterectomy    Family History  Problem Relation Age of Onset   Diabetes Father    Heart disease Father    Hypertension Father    Diabetes Mother    Cancer Mother        brain   Hypertension Mother  Kidney disease Sister    Diabetes Sister    Kidney cancer Sister    Stroke Sister    Diabetes Sister    Hypertension Sister    Diabetes Sister    Hypertension Sister    Obesity Son    Colon cancer Neg Hx    Colon polyps Neg Hx    Esophageal cancer Neg Hx    Rectal cancer Neg Hx    Stomach cancer Neg Hx    Past Surgical History:  Procedure Laterality Date   BACK SURGERY  2012   L5-S1 microendoscopic disectomy last surgery 06/2011   BREAST BIOPSY     LEFT    01/19/16   BREAST EXCISIONAL BIOPSY Left 02/2016   BREAST LUMPECTOMY WITH RADIOACTIVE SEED AND SENTINEL  LYMPH NODE BIOPSY Right 03/12/2021   Procedure: RIGHT BREAST LUMPECTOMY WITH RADIOACTIVE SEED AND SENTINEL LYMPH NODE BIOPSY;  Surgeon: Jovita Kussmaul, MD;  Location: [place];  Service: General;  Laterality: Right;   BREAST REDUCTION SURGERY  1982   CATARACT EXTRACTION Right 11/07/2019   COLONOSCOPY     HAND SURGERY     MIDDLE TRIGGER FINGER RIGHT SIDE   RADIOACTIVE SEED GUIDED EXCISIONAL BREAST BIOPSY Left 02/18/2016   Procedure: LEFT RADIOACTIVE SEED GUIDED EXCISIONAL BREAST BIOPSY;  Surgeon: Alphonsa Overall, MD;  Location: [place];  Service: General;  Laterality: Left;   REDUCTION MAMMAPLASTY Bilateral    TONSILLECTOMY  2008   TOTAL ABDOMINAL HYSTERECTOMY  05/24/2007   hysterectomy   TUBAL LIGATION     UPPER GASTROINTESTINAL ENDOSCOPY     Social History   Social History Narrative   Current Social History 01/17/2020        Patient lives with spouse in a home which is 1 story. There are not steps up to the entrance the patient uses.       Patient's method of transportation is personal car.      The highest level of education was some college.      The patient currently works part-time.      Identified important Relationships are God, husband, kids, grandkids       Pets : None       Interests / Fun: Drawing and nature       Current Stressors: Pain, Covid       Religious / Personal Beliefs: Christian       Other: None    Immunization History  Administered Date(s) Administered   Influenza Split 03/11/2011, 03/02/2012   Influenza Whole 02/12/2010   Influenza,inj,Quad PF,6+ Mos 03/08/2013, 02/27/2014, 05/19/2015, 04/19/2016, 03/09/2017, 02/06/2018, 02/12/2019, 04/10/2020   PFIZER(Purple Top)SARS-COV-2 Vaccination 07/25/2019, 08/19/2019, 03/21/2020, 01/03/2021   PPD Test 10/04/2010, 07/11/2011, 12/05/2012   Pneumococcal Polysaccharide-23 01/13/2012, 07/18/2017   Tdap 11/18/2010, 02/02/2021     Objective: Vital Signs: There were no vitals taken for this visit.   Physical Exam    Musculoskeletal Exam: ***  CDAI Exam: CDAI Score: -- Patient Global: --; Provider Global: -- Swollen: --; Tender: -- Joint Exam 04/14/2021   No joint exam has been documented for this visit   There is currently no information documented on the homunculus. Go to the Rheumatology activity and complete the homunculus joint exam.  Investigation: No additional findings.  Imaging: DG Ankle Complete Left  Result Date: 03/08/2021 Please see detailed radiograph report in office note.  DG Ankle Complete Right  Result Date: 03/08/2021 Please see detailed radiograph report in office note.  NM Sentinel Node Inj-No Rpt (Breast)  Result Date: 03/12/2021 Sulfur  Colloid was injected by the Nuclear Medicine Technologist for sentinel lymph node localization.   MM Breast Surgical Specimen  Result Date: 03/12/2021 CLINICAL DATA:  Surgical specimen post excision of a right breast lesion after radioactive seed localization. EXAM: SPECIMEN RADIOGRAPH OF THE RIGHT BREAST COMPARISON:  Previous exam(s). FINDINGS: Status post excision of the right breast. The radioactive seed and biopsy marker clip are present, completely intact, and were marked for pathology. IMPRESSION: Specimen radiograph of the right breast. Electronically Signed   By: Lajean Manes M.D.   On: 03/12/2021 11:27  MM RT RADIOACTIVE SEED LOC MAMMO GUIDE  Result Date: 03/11/2021 CLINICAL DATA:  60 year old female presenting for seed localization of the right breast prior to lumpectomy. EXAM: MAMMOGRAPHIC GUIDED RADIOACTIVE SEED LOCALIZATION OF THE RIGHT BREAST COMPARISON:  Previous exam(s). FINDINGS: Patient presents for radioactive seed localization prior to right breast lumpectomy. I met with the patient and we discussed the procedure of seed localization including benefits and alternatives. We discussed the high likelihood of a successful procedure. We discussed the risks of the procedure including infection, bleeding, tissue injury and  further surgery. We discussed the low dose of radioactivity involved in the procedure. Informed, written consent was given. The usual time-out protocol was performed immediately prior to the procedure. Using mammographic guidance, sterile technique, 1% lidocaine and an I-125 radioactive seed, the ribbon biopsy marking clip was localized using a lateral approach. The follow-up mammogram images confirm the seed in the expected location. Follow-up survey of the patient confirms presence of the radioactive seed. Order number of I-125 seed:  [id number]. Total activity: 0.246 mC reference Date: 30 Nov 2020 The patient tolerated the procedure well and was released from the [facility]. She was given instructions regarding seed removal. IMPRESSION: Radioactive seed localization right breast. No apparent complications. Electronically Signed   By: Audie Pinto M.D.   On: 03/11/2021 16:29   Recent Labs: Lab Results  Component Value Date   WBC 7.9 02/24/2021   HGB 15.2 (H) 02/24/2021   PLT 269 02/24/2021   NA 138 02/24/2021   K 3.9 02/24/2021   CL 96 (L) 02/24/2021   CO2 27 02/24/2021   GLUCOSE 263 (H) 02/24/2021   BUN 11 02/24/2021   CREATININE 0.96 02/24/2021   BILITOT 0.4 02/24/2021   ALKPHOS 63 02/24/2021   AST 27 02/24/2021   ALT 30 02/24/2021   PROT 8.3 (H) 02/24/2021   ALBUMIN 4.3 02/24/2021   CALCIUM 10.3 02/24/2021   GFRAA 110 07/10/2020    Speciality Comments: No specialty comments available.  Procedures:  No procedures performed Allergies: Lisinopril   Assessment / Plan:     Visit Diagnoses: No diagnosis found.  Orders: No orders of the defined types were placed in this encounter.  No orders of the defined types were placed in this encounter.   Face-to-face time spent with patient was *** minutes. Greater than 50% of time was spent in counseling and coordination of care.  Follow-Up Instructions: No follow-ups on file.   Earnestine Mealing, CMA  Note - This record has  been created using Editor, commissioning.  Chart creation errors have been sought, but may not always  have been located. Such creation errors do not reflect on  the standard of medical care.

## 2021-04-01 ENCOUNTER — Encounter: Payer: Self-pay | Admitting: *Deleted

## 2021-04-02 ENCOUNTER — Encounter: Payer: Self-pay | Admitting: Hematology and Oncology

## 2021-04-02 DIAGNOSIS — G4733 Obstructive sleep apnea (adult) (pediatric): Secondary | ICD-10-CM | POA: Diagnosis not present

## 2021-04-05 ENCOUNTER — Telehealth: Payer: Self-pay | Admitting: *Deleted

## 2021-04-05 ENCOUNTER — Encounter: Payer: Self-pay | Admitting: *Deleted

## 2021-04-05 ENCOUNTER — Telehealth: Payer: Self-pay | Admitting: Radiation Oncology

## 2021-04-05 DIAGNOSIS — C50411 Malignant neoplasm of upper-outer quadrant of right female breast: Secondary | ICD-10-CM

## 2021-04-05 DIAGNOSIS — Z170 Estrogen receptor positive status [ER+]: Secondary | ICD-10-CM

## 2021-04-05 NOTE — Telephone Encounter (Signed)
Left message on identified voicemail for a return phone call regarding chemo decision.

## 2021-04-05 NOTE — Telephone Encounter (Signed)
Received call back from patient.  She wishes to proceed with xrt and NO chemo.  I have placed a referral for Dr. Lisbeth Renshaw.

## 2021-04-06 ENCOUNTER — Telehealth: Payer: Self-pay | Admitting: *Deleted

## 2021-04-06 ENCOUNTER — Telehealth: Payer: Self-pay | Admitting: Nutrition

## 2021-04-06 ENCOUNTER — Encounter: Payer: PPO | Admitting: Nutrition

## 2021-04-06 ENCOUNTER — Other Ambulatory Visit: Payer: Self-pay

## 2021-04-06 NOTE — Telephone Encounter (Signed)
RETURNED PATIENT'S WIFE'S PHONE CALL, LVM FOR A RETURN CALL 

## 2021-04-06 NOTE — Telephone Encounter (Signed)
Patient came in for Dash pump start, but had pods.  She was put in the scheduled by scheduler upstairs without knowledge of myself.  No orders for pump start and insulin, Pt. Rescheduled for 04/20/21.  Orders for pump start put on Dr. Quin Hoop desk.

## 2021-04-07 ENCOUNTER — Other Ambulatory Visit: Payer: Self-pay | Admitting: Internal Medicine

## 2021-04-07 MED ORDER — INSULIN ASPART 100 UNIT/ML IJ SOLN: INTRAMUSCULAR | 4 refills | Status: DC

## 2021-04-08 ENCOUNTER — Ambulatory Visit
Admission: RE | Admit: 2021-04-08 | Discharge: 2021-04-08 | Disposition: A | Discharge status: Home / Self Care | Payer: PPO | Source: Ambulatory Visit | Attending: Radiation Oncology | Admitting: Radiation Oncology

## 2021-04-08 ENCOUNTER — Other Ambulatory Visit: Payer: Self-pay

## 2021-04-08 ENCOUNTER — Encounter: Payer: Self-pay | Admitting: Radiation Oncology

## 2021-04-08 VITALS — BP 138/68 | HR 86 | Temp 99.4°F | Resp 18 | Ht 64.0 in | Wt 244.0 lb

## 2021-04-08 DIAGNOSIS — Z79899 Other long term (current) drug therapy: Secondary | ICD-10-CM | POA: Insufficient documentation

## 2021-04-08 DIAGNOSIS — K219 Gastro-esophageal reflux disease without esophagitis: Secondary | ICD-10-CM | POA: Diagnosis not present

## 2021-04-08 DIAGNOSIS — C50411 Malignant neoplasm of upper-outer quadrant of right female breast: Secondary | ICD-10-CM

## 2021-04-08 DIAGNOSIS — Z170 Estrogen receptor positive status [ER+]: Secondary | ICD-10-CM | POA: Diagnosis not present

## 2021-04-08 DIAGNOSIS — I10 Essential (primary) hypertension: Secondary | ICD-10-CM | POA: Insufficient documentation

## 2021-04-08 DIAGNOSIS — F17210 Nicotine dependence, cigarettes, uncomplicated: Secondary | ICD-10-CM | POA: Diagnosis not present

## 2021-04-08 DIAGNOSIS — G4730 Sleep apnea, unspecified: Secondary | ICD-10-CM | POA: Diagnosis not present

## 2021-04-08 DIAGNOSIS — Z8052 Family history of malignant neoplasm of bladder: Secondary | ICD-10-CM | POA: Diagnosis not present

## 2021-04-08 DIAGNOSIS — E785 Hyperlipidemia, unspecified: Secondary | ICD-10-CM | POA: Diagnosis not present

## 2021-04-08 DIAGNOSIS — J449 Chronic obstructive pulmonary disease, unspecified: Secondary | ICD-10-CM | POA: Insufficient documentation

## 2021-04-08 DIAGNOSIS — E1140 Type 2 diabetes mellitus with diabetic neuropathy, unspecified: Secondary | ICD-10-CM | POA: Insufficient documentation

## 2021-04-08 DIAGNOSIS — Z794 Long term (current) use of insulin: Secondary | ICD-10-CM | POA: Diagnosis not present

## 2021-04-08 NOTE — Progress Notes (Signed)
Patient reports RT breast pain/ tenderness 7/10. No other symptoms reported at this time.  Meaningful use complete.  Hysterectomy- No chances of pregnancy  BP 138/68 (BP Location: Left Arm, Patient Position: Sitting)   Pulse 86   Temp 99.4 F (37.4 C) (Oral)   Resp 18   Ht 5\' 4"  (1.626 m)   Wt 244 lb (110.7 kg)   SpO2 100%   BMI 41.88 kg/m

## 2021-04-08 NOTE — Progress Notes (Signed)
Radiation Oncology         [phone] ________________________________  Name: [name]        MRN: [id number]  Date of Service: 04/08/2021 DOB: September 09, 1960  [id number], Ines Bloomer, MD  Nicholas Lose, MD     REFERRING PHYSICIAN: Nicholas Lose, MD   DIAGNOSIS: The encounter diagnosis was Malignant neoplasm of upper-outer quadrant of right breast in female, estrogen receptor positive ([place]).   HISTORY OF PRESENT ILLNESS: [name] is a 60 y.o. female originally seen in the multidisciplinary breast clinic for a new diagnosis of right breast cancer. The patient was noted to have a right breast asymmetry that measured 7 mm on ultrasound and axilla was negative for adenopathy. 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   Since her last visit she has undergone right lumpectomy and sentinel node biopsy on 03/12/21 that revealed a grade 1 invasive ductal carcinoma measuring up 1.2 cm with associated low grade DCIS. Her margins were negative for disease, and additional inferior margin specimen was benign. She had 5 sampled nodes that were negative for disease. Her Oncotype Dx score was 27. She met with Dr. Lindi Adie and he offered her chemotherapy. She has decided to forgo chemotherapy. She's seen today to discuss adjuvant radiotherapy.   PREVIOUS RADIATION THERAPY: No   PAST MEDICAL HISTORY:  Past Medical History:  Diagnosis Date   Allergy    Arthritis    back    Asthma    AS CHILD   Chronic back pain    Chronic leg pain    due to back pain   Cocaine abuse ([place])    in remission   COPD (chronic obstructive pulmonary disease) ([place])    Diabetes ([place])    with neuropathy   GERD (gastroesophageal reflux disease)    Hyperlipidemia    Hypertension    Intraductal papilloma of left breast 02/18/2016   Neuromuscular disorder (H[facility])    neuropathy   Postlaminectomy syndrome of lumbar region 12/07/2011    RECTAL BLEEDING 12/09/2008   Annotation: s/p EGD 7/08 mild gastritis, s/p colonoscopy 7/08- benign polyp  s/p polypectomy and isolated diverticulum.  Qualifier: Diagnosis of  By: Ditzler RN, Debra     Sciatica    per patient    Sleep apnea    CPAP    YEARS AGO DONE 1/2 YEARS AGO AND WAS TOLD DID NOT HAVE   Tobacco abuse    Uterine fibroid    s/p hysterectomy       PAST SURGICAL HISTORY: Past Surgical History:  Procedure Laterality Date   BACK SURGERY  2012   L5-S1 microendoscopic disectomy last surgery 06/2011   BREAST BIOPSY     LEFT    01/19/16   BREAST EXCISIONAL BIOPSY Left 02/2016   BREAST LUMPECTOMY WITH RADIOACTIVE SEED AND SENTINEL LYMPH NODE BIOPSY Right 03/12/2021   Procedure: RIGHT BREAST LUMPECTOMY WITH RADIOACTIVE SEED AND SENTINEL LYMPH NODE BIOPSY;  Surgeon: Jovita Kussmaul, MD;  Location: [facility];  Service: General;  Laterality: Right;   BREAST REDUCTION SURGERY  1982   CATARACT EXTRACTION Right 11/07/2019   COLONOSCOPY     HAND SURGERY     MIDDLE TRIGGER FINGER RIGHT SIDE   RADIOACTIVE SEED GUIDED EXCISIONAL BREAST BIOPSY Left 02/18/2016   Procedure: LEFT RADIOACTIVE SEED GUIDED EXCISIONAL BREAST BIOPSY;  Surgeon: Alphonsa Overall, MD;  Location: [facility];  Service: General;  Laterality: Left;  REDUCTION MAMMAPLASTY Bilateral    TONSILLECTOMY  2008   TOTAL ABDOMINAL HYSTERECTOMY  05/24/2007   hysterectomy   TUBAL LIGATION     UPPER GASTROINTESTINAL ENDOSCOPY       FAMILY HISTORY:  Family History  Problem Relation Age of Onset   Diabetes Father    Heart disease Father    Hypertension Father    Diabetes Mother    Cancer Mother        brain   Hypertension Mother    Kidney disease Sister    Diabetes Sister    Kidney cancer Sister    Stroke Sister    Diabetes Sister    Hypertension Sister    Diabetes Sister    Hypertension Sister    Obesity Son    Colon cancer Neg Hx    Colon polyps Neg Hx    Esophageal cancer Neg Hx    Rectal cancer Neg Hx    Stomach cancer  Neg Hx      SOCIAL HISTORY:  reports that she quit smoking about 24 years ago. Her smoking use included cigarettes. She has a 10.00 pack-year smoking history. She has never used smokeless tobacco. She reports that she does not drink alcohol and does not use drugs. The patient is married and lives in [place]. 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 She's accompanied by her husband. They have 3 adult children and 6 grandchildren.   ALLERGIES: Lisinopril   MEDICATIONS:  Current Outpatient Medications  Medication Sig Dispense Refill   acyclovir (ZOVIRAX) 400 MG tablet TAKE 1 TABLET BY MOUTH THREE TIMES DAILY AS NEEDED (FOR  FLARES) (Patient taking differently: Take 400 mg by mouth 2 (two) times daily as needed (flares).) 90 tablet 0   albuterol (VENTOLIN HFA) 108 (90 Base) MCG/ACT inhaler Inhale 2 puffs into the lungs every 6 (six) hours as needed for wheezing or shortness of breath. 18 g 12   azithromycin (ZITHROMAX) 250 MG tablet 2 today then one daily (Patient not taking: Reported on 03/03/2021) 6 tablet 0   BELBUCA 450 MCG FILM Take 450 mcg by mouth 2 (two) times daily as needed (pain).     Blood Glucose Monitoring Suppl (ONETOUCH VERIO FLEX SYSTEM) w/Device KIT 1 each by Does not apply route 3 (three) times daily. 1 kit 1   budesonide-formoterol (SYMBICORT) 80-4.5 MCG/ACT inhaler Inhale 2 puffs then rinse mouth, twice daily- maintenance 10.2 g 12   chlorpheniramine (CHLOR-TRIMETON) 4 MG tablet Take 8 mg by mouth in the morning and at bedtime.     Continuous Blood Gluc Sensor (FREESTYLE LIBRE 2 SENSOR) MISC USE TO CHECK BLOOD SUGARS FOUR TIMES DAILY. 2 each 0   Continuous Glucose Monitor DEVI Use as directed. 1 each 0   dapagliflozin propanediol (FARXIGA) 10 MG TABS tablet Take 1 tablet (10 mg total) by mouth daily. 90 tablet 3   diclofenac Sodium (VOLTAREN) 1 % GEL Apply 2 g topically 4 (four) times daily. Rub into affected area of foot 2 to 4  times daily (Patient taking differently: Apply 2 g topically 4 (four) times daily as needed (foot pain).) 100 g 2   dicyclomine (BENTYL) 10 MG capsule TAKE 1 CAPSULE BY MOUTH THREE TIMES DAILY BEFORE MEAL(S) 30 capsule 0   esomeprazole (NEXIUM) 40 MG capsule Take 1 capsule (40 mg total) by mouth 2 (two) times daily before a meal. 60 capsule 5   ezetimibe (ZETIA) 10 MG tablet Take 1 tablet (10 mg total) by mouth  daily. 30 tablet 11   gabapentin (NEURONTIN) 600 MG tablet Take 600 mg by mouth 2 (two) times daily.     hydrochlorothiazide (HYDRODIURIL) 25 MG tablet Take 1 tablet by mouth once daily 90 tablet 1   HYDROcodone-acetaminophen (NORCO/VICODIN) 5-325 MG tablet Take 1 tablet by mouth every 6 (six) hours as needed for moderate pain or severe pain. 15 tablet 0   insulin aspart (NOVOLOG) 100 UNIT/ML injection Max daily 150 units 130 mL 4   insulin aspart protamine - aspart (NOVOLOG 70/30 FLEXPEN) (70-30) 100 UNIT/ML FlexPen Inject 90 Units into the skin daily with breakfast AND 89 Units daily with supper. Inject 80 units with Breakfast and 80 units with Supper. (Patient taking differently: Inject 90 Units into the skin daily with breakfast AND 89 Units daily with supper.) 60 mL 3   Insulin Disposable Pump (OMNIPOD DASH INTRO, GEN 4,) KIT 1 Device by Does not apply route every 3 (three) days. 1 kit 0   Insulin Disposable Pump (OMNIPOD DASH PODS, GEN 4,) MISC 1 Device by Does not apply route every 3 (three) days. 6 each 3   Insulin Pen Needle 32G X 4 MM MISC Use to inject insulin two times a day 200 each 3   Insulin Syringe-Needle U-100 (INSULIN SYRINGE 1CC/30GX1/2") 30G X 1/2" 1 ML MISC Use to fill Vgo daily 100 each 5   irbesartan (AVAPRO) 300 MG tablet Take 1 tablet (300 mg total) by mouth daily. 90 tablet 0   metFORMIN (GLUCOPHAGE) 1000 MG tablet TAKE 1 TABLET BY MOUTH TWICE DAILY WITH  A  MEAL 180 tablet 0   montelukast (SINGULAIR) 10 MG tablet Take 1 tablet (10 mg total) by mouth at bedtime. 30  tablet 11   Multiple Vitamin (MULTIVITAMIN WITH MINERALS) TABS tablet Take 1 tablet by mouth daily.     NARCAN 4 MG/0.1ML LIQD nasal spray kit Place 0.4 mg into the nose once.     nystatin (MYCOSTATIN) 100000 UNIT/ML suspension Use 4-6 cc four times daily and continue for 48 hours after symptoms clear (Patient not taking: Reported on 03/03/2021) 240 mL 0   ONETOUCH DELICA LANCETS FINE MISC Check blood sugar 3 times a day 100 each 12   ONETOUCH VERIO test strip  CHECK BLOOD SUGAR 3 TIMES A DAY 100 each 5   oxyCODONE-acetaminophen (PERCOCET) 10-325 MG tablet Take 1 tablet by mouth 4 (four) times daily as needed for pain.     rosuvastatin (CRESTOR) 10 MG tablet Take 1 tablet (10 mg total) by mouth daily. 90 tablet 3   Semaglutide, 2 MG/DOSE, (OZEMPIC, 2 MG/DOSE,) 8 MG/3ML SOPN Inject 2 mg into the skin once a week. 9 mL 3   valACYclovir (VALTREX) 500 MG tablet TAKE 1 TABLET BY MOUTH TWICE DAILY FOR 5 DAYS AT  TIME  OF  OUTBREAK 10 tablet 0   No current facility-administered medications for this encounter.     REVIEW OF SYSTEMS: On review of systems, the patient reports that she is doing really well overall. She has had soreness along her incision site due to the location of her arm rubbing against it when she reaches or uses her right arm for things. She denies any redness or drainage from the site. No other complaints are verbalized.      PHYSICAL EXAM:  Wt Readings from Last 3 Encounters:  03/30/21 243 lb (110.2 kg)  03/23/21 242 lb 12.8 oz (110.1 kg)  03/12/21 238 lb 8 oz (108.2 kg)   Temp Readings from Last  3 Encounters:  03/30/21 97.8 F (36.6 C) (Temporal)  03/23/21 (!) 97.2 F (36.2 C) (Temporal)  03/12/21 (!) 97.5 F (36.4 C)   BP Readings from Last 3 Encounters:  03/30/21 (!) 145/68  03/23/21 137/67  03/12/21 110/84   Pulse Readings from Last 3 Encounters:  03/30/21 95  03/23/21 95  03/12/21 77    In general this is a well appearing African American female in no acute  distress. She's alert and oriented x4 and appropriate throughout the examination. Cardiopulmonary assessment is negative for acute distress and she exhibits normal effort. The right breast reveals a well healed single incision site without erythema, separation, or drainage.     ECOG = 1  0 - Asymptomatic (Fully active, able to carry on all predisease activities without restriction)  1 - Symptomatic but completely ambulatory (Restricted in physically strenuous activity but ambulatory and able to carry out work of a light or sedentary nature. For example, light housework, office work)  2 - Symptomatic, <50% in bed during the day (Ambulatory and capable of all self care but unable to carry out any work activities. Up and about more than 50% of waking hours)  3 - Symptomatic, >50% in bed, but not bedbound (Capable of only limited self-care, confined to bed or chair 50% or more of waking hours)  4 - Bedbound (Completely disabled. Cannot carry on any self-care. Totally confined to bed or chair)  5 - Death   Eustace Pen MM, Creech RH, Tormey DC, et al. [phone]). "Toxicity and response criteria of the [facility]". [place] Oncol. 5 (6): 649-55    LABORATORY DATA:  Lab Results  Component Value Date   WBC 7.9 02/24/2021   HGB 15.2 (H) 02/24/2021   HCT 47.4 (H) 02/24/2021   MCV 85.1 02/24/2021   PLT 269 02/24/2021   Lab Results  Component Value Date   NA 138 02/24/2021   K 3.9 02/24/2021   CL 96 (L) 02/24/2021   CO2 27 02/24/2021   Lab Results  Component Value Date   ALT 30 02/24/2021   AST 27 02/24/2021   ALKPHOS 63 02/24/2021   BILITOT 0.4 02/24/2021      RADIOGRAPHY: NM Sentinel Node Inj-No Rpt (Breast)  Result Date: 03/12/2021 Sulfur Colloid was injected by the Nuclear Medicine Technologist for sentinel lymph node localization.   MM Breast Surgical Specimen  Result Date: 03/12/2021 CLINICAL DATA:  Surgical specimen post excision of a right breast  lesion after radioactive seed localization. EXAM: SPECIMEN RADIOGRAPH OF THE RIGHT BREAST COMPARISON:  Previous exam(s). FINDINGS: Status post excision of the right breast. The radioactive seed and biopsy marker clip are present, completely intact, and were marked for pathology. IMPRESSION: Specimen radiograph of the right breast. Electronically Signed   By: Lajean Manes M.D.   On: 03/12/2021 11:27  MM RT RADIOACTIVE SEED LOC MAMMO GUIDE  Result Date: 03/11/2021 CLINICAL DATA:  60 year old female presenting for seed localization of the right breast prior to lumpectomy. EXAM: MAMMOGRAPHIC GUIDED RADIOACTIVE SEED LOCALIZATION OF THE RIGHT BREAST COMPARISON:  Previous exam(s). FINDINGS: Patient presents for radioactive seed localization prior to right breast lumpectomy. I met with the patient and we discussed the procedure of seed localization including benefits and alternatives. We discussed the high likelihood of a successful procedure. We discussed the risks of the procedure including infection, bleeding, tissue injury and further surgery. We discussed the low dose of radioactivity involved in the procedure. Informed, written consent was given. The usual time-out  protocol was performed immediately prior to the procedure. Using mammographic guidance, sterile technique, 1% lidocaine and an I-125 radioactive seed, the ribbon biopsy marking clip was localized using a lateral approach. The follow-up mammogram images confirm the seed in the expected location. Follow-up survey of the patient confirms presence of the radioactive seed. Order number of I-125 seed:  [id number]. Total activity: 0.246 mC reference Date: 30 Nov 2020 The patient tolerated the procedure well and was released from the [facility]. She was given instructions regarding seed removal. IMPRESSION: Radioactive seed localization right breast. No apparent complications. Electronically Signed   By: Audie Pinto M.D.   On: 03/11/2021 16:29       IMPRESSION/PLAN: 1. Stage IA, [id number] grade 1, ER/PR positive invasive ductal carcinoma of the right breast. Dr. Lisbeth Renshaw discusses the final pathology findings and reviews the nature of right breast disease. She is healing well from surgery and has decided to forgo chemotherapt. She is ready to now proceed with external radiotherapy to the breast  to reduce risks of local recurrence followed by antiestrogen therapy. We discussed the risks, benefits, short, and long term effects of radiotherapy, as well as the curative intent, and the patient is interested in proceeding. Dr. Lisbeth Renshaw discusses the delivery and logistics of radiotherapy and recommends 4  weeks of radiotherapy to the right breast. Written consent is obtained and placed in the chart, a copy was provided to the patient. She will simulate next week.   In a visit lasting 45 minutes, greater than 50% of the time was spent face to face reviewing her case, as well as in preparation of, discussing, and coordinating the patient's care.  The above documentation reflects my direct findings during this shared patient visit. Please see the separate note by Dr. Lisbeth Renshaw on this date for the remainder of the patient's plan of care.    Carola Rhine, [facility]    **Disclaimer: This note was dictated with voice recognition software. Similar sounding words can inadvertently be transcribed and this note may contain transcription errors which may not have been corrected upon publication of note.**

## 2021-04-09 ENCOUNTER — Encounter: Payer: Self-pay | Admitting: General Practice

## 2021-04-09 ENCOUNTER — Telehealth: Payer: Self-pay | Admitting: Internal Medicine

## 2021-04-09 NOTE — Telephone Encounter (Signed)
Pt calling to inform that [facility] doesn't have Semaglutide, 2 MG/DOSE, (OZEMPIC, 2 MG/DOSE,) 8 MG/3ML SOPN. Next dose due 11/4  Also, Omnipods kit/supplies needs a PA. Please advise.   Pt contact [phone]

## 2021-04-09 NOTE — Progress Notes (Signed)
[facility] Psychosocial Distress Screening Clinical Social Work  Clinical Social Work was referred by distress screening protocol.  The patient scored a 9 on the Psychosocial Distress Thermometer which indicates severe distress. Clinical Social Worker contacted patient by phone to assess for distress and other psychosocial needs. Is able to continue to work, works for job Special educational needs teacher.  Continues to experience pain and swelling near incision site.  Is adjusting to multiple appointments, phone calls, "just cope with this cancer piece."  Worries about swelling, different breast sizes, pain/discomfort, "what feels like a lump."  These are symptoms she associates with cancer, so naturally worries that her disease may advance.  Has several family members who have died from cancer or are dealing with metastatic disease - she wants to do everything she can to maintain her own health.  She is aware that this is part of the healing process from surgery, but is also worried.  Talked about strategies for managing anxiety in context of cancer.  Will link her w [facility] guide, referral made.  Will send her information on [place], encouraged participation.  Asked to to reach out if anxiety has not diminished in a few weeks and we can talk about strategies for increased support.   ONCBCN DISTRESS SCREENING 04/08/2021  Screening Type   Distress experienced in past week (1-10) 9  Emotional problem type Nervousness/Anxiety;Adjusting to illness  Information Concerns Type   Physical Problem type    ONCBCN DISTRESS SCREENING 02/24/2021  Screening Type Initial Screening  Distress experienced in past week (1-10) 10  Emotional problem type Nervousness/Anxiety;Depression  Information Concerns Type Lack of info about diagnosis;Lack of info about treatment  Physical Problem type Pain    Clinical Social Worker follow up needed: No.  If yes, follow up plan:  Beverely Pace, [place],  LCSW Clinical Social Worker Phone:  [phone]

## 2021-04-12 ENCOUNTER — Other Ambulatory Visit (HOSPITAL_COMMUNITY): Payer: Self-pay

## 2021-04-12 NOTE — Telephone Encounter (Signed)
Patient advise that sample is ready. PA needed for [facility] [employer]

## 2021-04-13 NOTE — Telephone Encounter (Signed)
Pt picked up sample.  

## 2021-04-14 ENCOUNTER — Ambulatory Visit: Payer: PPO | Admitting: Rheumatology

## 2021-04-14 ENCOUNTER — Other Ambulatory Visit (HOSPITAL_COMMUNITY): Payer: Self-pay

## 2021-04-14 DIAGNOSIS — G894 Chronic pain syndrome: Secondary | ICD-10-CM

## 2021-04-14 DIAGNOSIS — E785 Hyperlipidemia, unspecified: Secondary | ICD-10-CM

## 2021-04-14 DIAGNOSIS — E1140 Type 2 diabetes mellitus with diabetic neuropathy, unspecified: Secondary | ICD-10-CM

## 2021-04-14 DIAGNOSIS — S99191K Other physeal fracture of right metatarsal, subsequent encounter for fracture with nonunion: Secondary | ICD-10-CM

## 2021-04-14 DIAGNOSIS — M19041 Primary osteoarthritis, right hand: Secondary | ICD-10-CM

## 2021-04-14 DIAGNOSIS — E559 Vitamin D deficiency, unspecified: Secondary | ICD-10-CM

## 2021-04-14 DIAGNOSIS — I10 Essential (primary) hypertension: Secondary | ICD-10-CM

## 2021-04-14 DIAGNOSIS — K219 Gastro-esophageal reflux disease without esophagitis: Secondary | ICD-10-CM

## 2021-04-14 DIAGNOSIS — G4733 Obstructive sleep apnea (adult) (pediatric): Secondary | ICD-10-CM

## 2021-04-14 DIAGNOSIS — R768 Other specified abnormal immunological findings in serum: Secondary | ICD-10-CM

## 2021-04-14 DIAGNOSIS — M5136 Other intervertebral disc degeneration, lumbar region: Secondary | ICD-10-CM

## 2021-04-14 DIAGNOSIS — Z79891 Long term (current) use of opiate analgesic: Secondary | ICD-10-CM

## 2021-04-14 DIAGNOSIS — Z8261 Family history of arthritis: Secondary | ICD-10-CM

## 2021-04-14 DIAGNOSIS — K760 Fatty (change of) liver, not elsewhere classified: Secondary | ICD-10-CM

## 2021-04-14 DIAGNOSIS — M160 Bilateral primary osteoarthritis of hip: Secondary | ICD-10-CM

## 2021-04-14 DIAGNOSIS — J449 Chronic obstructive pulmonary disease, unspecified: Secondary | ICD-10-CM

## 2021-04-14 DIAGNOSIS — M170 Bilateral primary osteoarthritis of knee: Secondary | ICD-10-CM

## 2021-04-14 NOTE — Telephone Encounter (Signed)
Received notification from [facility] regarding a prior authorization for OMNIPOD. Authorization has been APPROVED from 9.15.22 to 12.31.22.    Authorization # [id number]

## 2021-04-14 NOTE — Telephone Encounter (Signed)
Pt received Omnipod Dash Gen 4 kit and pods on 9.15.22. This PA is good until 12.31.22. Is this a new PA request for the Omnipod Gen5?

## 2021-04-15 ENCOUNTER — Other Ambulatory Visit: Payer: Self-pay

## 2021-04-15 ENCOUNTER — Encounter: Payer: Self-pay | Admitting: *Deleted

## 2021-04-15 ENCOUNTER — Ambulatory Visit
Admission: RE | Admit: 2021-04-15 | Discharge: 2021-04-15 | Disposition: A | Discharge status: Home / Self Care | Payer: PPO | Source: Ambulatory Visit | Attending: Radiation Oncology | Admitting: Radiation Oncology

## 2021-04-15 DIAGNOSIS — Z510 Encounter for antineoplastic radiation therapy: Secondary | ICD-10-CM | POA: Insufficient documentation

## 2021-04-15 DIAGNOSIS — R293 Abnormal posture: Secondary | ICD-10-CM | POA: Diagnosis not present

## 2021-04-15 DIAGNOSIS — Z170 Estrogen receptor positive status [ER+]: Secondary | ICD-10-CM | POA: Diagnosis not present

## 2021-04-15 DIAGNOSIS — C50411 Malignant neoplasm of upper-outer quadrant of right female breast: Secondary | ICD-10-CM | POA: Insufficient documentation

## 2021-04-15 DIAGNOSIS — R600 Localized edema: Secondary | ICD-10-CM | POA: Diagnosis not present

## 2021-04-16 ENCOUNTER — Encounter: Payer: Self-pay | Admitting: Nurse Practitioner

## 2021-04-16 ENCOUNTER — Ambulatory Visit: Payer: PPO | Admitting: Nurse Practitioner

## 2021-04-16 VITALS — BP 130/88 | HR 90 | Ht 64.0 in | Wt 240.0 lb

## 2021-04-16 DIAGNOSIS — E1169 Type 2 diabetes mellitus with other specified complication: Secondary | ICD-10-CM | POA: Diagnosis not present

## 2021-04-16 DIAGNOSIS — E1140 Type 2 diabetes mellitus with diabetic neuropathy, unspecified: Secondary | ICD-10-CM | POA: Diagnosis not present

## 2021-04-16 DIAGNOSIS — K219 Gastro-esophageal reflux disease without esophagitis: Secondary | ICD-10-CM | POA: Diagnosis not present

## 2021-04-16 DIAGNOSIS — K2970 Gastritis, unspecified, without bleeding: Secondary | ICD-10-CM

## 2021-04-16 DIAGNOSIS — R1013 Epigastric pain: Secondary | ICD-10-CM

## 2021-04-16 DIAGNOSIS — Z794 Long term (current) use of insulin: Secondary | ICD-10-CM | POA: Diagnosis not present

## 2021-04-16 DIAGNOSIS — E1142 Type 2 diabetes mellitus with diabetic polyneuropathy: Secondary | ICD-10-CM | POA: Diagnosis not present

## 2021-04-16 DIAGNOSIS — E119 Type 2 diabetes mellitus without complications: Secondary | ICD-10-CM | POA: Diagnosis not present

## 2021-04-16 MED ORDER — DICYCLOMINE HCL 10 MG PO CAPS: 10.0000 mg | ORAL_CAPSULE | Freq: Two times a day (BID) | ORAL | 6 refills | Status: DC | PRN

## 2021-04-16 NOTE — Patient Instructions (Addendum)
We have sent the following medications to your pharmacy for you to pick up at your convenience: Bentyl  We are giving you GERD information to read and follow.   I appreciate the opportunity to care for you. Tye Savoy, NP-C

## 2021-04-16 NOTE — Progress Notes (Signed)
Reviewed and agree with management plan.  Lamari Youngers T. Christabella Alvira, MD FACG 

## 2021-04-16 NOTE — Progress Notes (Signed)
ASSESSMENT AND PLAN    # 60 yo female with chronic epigastric pain. Prior EGDs remarkable for gastroduodenitis. Doing well on  Dicyclomine 10 mg BID as needed., needs refill.   --Refill Bentyl 10 mg BID prn.    # GERD. Overall controlled on BID Nexium Has occasional heartburn and regurgitation   --Anti-reflux measures discussed including avoidance of trigger foods and evening meals / bedtime snacks. If able, elevate head of bed 6-8 inches. If unable to elevate the head of the bed consider a wedge pillow.   Weight reduction / maintaining a healthy BMI) as increased abdominal girth is associated with reflux.  --GERD literature given --If having worsening reflux symptoms despite above then let us know  # History of adenomatous colon polyps in 2018 -5 year recall colonoscopy May 2023  HISTORY OF PRESENT ILLNESS    Chief Complaint : needs refill on Bentyl   [name] is a 61 y.o. female known to Dr. Fuller Plan  with a past medical history  of DM, hyperlipidemia, hypertension, chronic back pain , sleep apnea , gastritis, chronic upper abdominal pain, GERD, adenomatous colon polyps. Additional medical history as listed in [place] .   Patient was last seen in July 2021 for evaluation of upper abdominal pain . She has a history of GERD and also recurrent non- H.pylori related gastritis on the last 2-3 EGDs. Most recent EGD done July 2021 also remarkable for gastritis. For the most part her upper abdominal pain and GERD symptoms are controlled on BID Bentyl and BID Nexium. She has occasional heartburn and regurgitation. She takes Ozempic which can cause abdominal pain in some people but the pain that she has is more chronic  No lower GI symptoms. Bowel movements normal. No blood in stool    September 2022 Glucose 263 Liver chemistries normal Hgb 15.2  PREVIOUS ENDOSCOPIC EVALUATIONS / PERTINENT STUDIES:   NOT COMPLETE LIST OF ENDOSCOPIC PROCEDURES  10/14/2016 EGD with erythematous mucosa in  the gastric body and erythematous duodenopathy.  Pathology showed mild chronic inactive gastritis.  Colonoscopy on the same day with six polyps, pathology showed adenomatous.  Repeat recommended in 5 years  July 2021 EGD for upper abdomina pain  Diffuse moderate inflammation characterized by erythema, friability and granularity was found in the gastric body and in the gastric antrum. The exam of the stomach was otherwise normal.  Surgical [P], gastric antrum and gastric body - MILD CHRONIC GASTRITIS. MILD REACTIVE GASTROPATHY. - WARTHIN-STARRY STAIN IS NEGATIVE FOR HELICOBACTER PYLORI.    Current Medications, Allergies, Past Medical History, Past Surgical History, Family History and Social History were reviewed in [employer] record.     Current Outpatient Medications  Medication Sig Dispense Refill   acyclovir (ZOVIRAX) 400 MG tablet TAKE 1 TABLET BY MOUTH THREE TIMES DAILY AS NEEDED (FOR  FLARES) (Patient taking differently: Take 400 mg by mouth 2 (two) times daily as needed (flares).) 90 tablet 0   albuterol (VENTOLIN HFA) 108 (90 Base) MCG/ACT inhaler Inhale 2 puffs into the lungs every 6 (six) hours as needed for wheezing or shortness of breath. 18 g 12   azithromycin (ZITHROMAX) 250 MG tablet 2 today then one daily (Patient not taking: Reported on 03/03/2021) 6 tablet 0   BELBUCA 450 MCG FILM Take 450 mcg by mouth 2 (two) times daily as needed (pain).     Blood Glucose Monitoring Suppl (ONETOUCH VERIO FLEX SYSTEM) w/Device KIT 1 each by Does not apply route 3 (three)  times daily. 1 kit 1   budesonide-formoterol (SYMBICORT) 80-4.5 MCG/ACT inhaler Inhale 2 puffs then rinse mouth, twice daily- maintenance 10.2 g 12   chlorpheniramine (CHLOR-TRIMETON) 4 MG tablet Take 8 mg by mouth in the morning and at bedtime.     Continuous Blood Gluc Sensor (FREESTYLE LIBRE 2 SENSOR) MISC USE TO CHECK BLOOD SUGARS FOUR TIMES DAILY. 2 each 0   Continuous Glucose Monitor DEVI Use as  directed. 1 each 0   dapagliflozin propanediol (FARXIGA) 10 MG TABS tablet Take 1 tablet (10 mg total) by mouth daily. 90 tablet 3   diclofenac Sodium (VOLTAREN) 1 % GEL Apply 2 g topically 4 (four) times daily. Rub into affected area of foot 2 to 4 times daily (Patient taking differently: Apply 2 g topically 4 (four) times daily as needed (foot pain).) 100 g 2   dicyclomine (BENTYL) 10 MG capsule TAKE 1 CAPSULE BY MOUTH THREE TIMES DAILY BEFORE MEAL(S) 30 capsule 0   esomeprazole (NEXIUM) 40 MG capsule Take 1 capsule (40 mg total) by mouth 2 (two) times daily before a meal. 60 capsule 5   ezetimibe (ZETIA) 10 MG tablet Take 1 tablet (10 mg total) by mouth daily. 30 tablet 11   gabapentin (NEURONTIN) 600 MG tablet Take 600 mg by mouth 2 (two) times daily.     hydrochlorothiazide (HYDRODIURIL) 25 MG tablet Take 1 tablet by mouth once daily 90 tablet 1   HYDROcodone-acetaminophen (NORCO/VICODIN) 5-325 MG tablet Take 1 tablet by mouth every 6 (six) hours as needed for moderate pain or severe pain. 15 tablet 0   insulin aspart (NOVOLOG) 100 UNIT/ML injection Max daily 150 units 130 mL 4   insulin aspart protamine - aspart (NOVOLOG 70/30 FLEXPEN) (70-30) 100 UNIT/ML FlexPen Inject 90 Units into the skin daily with breakfast AND 89 Units daily with supper. Inject 80 units with Breakfast and 80 units with Supper. (Patient taking differently: Inject 90 Units into the skin daily with breakfast AND 89 Units daily with supper.) 60 mL 3   Insulin Disposable Pump (OMNIPOD DASH INTRO, GEN 4,) KIT 1 Device by Does not apply route every 3 (three) days. 1 kit 0   Insulin Disposable Pump (OMNIPOD DASH PODS, GEN 4,) MISC 1 Device by Does not apply route every 3 (three) days. 6 each 3   Insulin Pen Needle 32G X 4 MM MISC Use to inject insulin two times a day 200 each 3   Insulin Syringe-Needle U-100 (INSULIN SYRINGE 1CC/30GX1/2") 30G X 1/2" 1 ML MISC Use to fill Vgo daily 100 each 5   irbesartan (AVAPRO) 300 MG tablet  Take 1 tablet (300 mg total) by mouth daily. 90 tablet 0   metFORMIN (GLUCOPHAGE) 1000 MG tablet TAKE 1 TABLET BY MOUTH TWICE DAILY WITH  A  MEAL 180 tablet 0   montelukast (SINGULAIR) 10 MG tablet Take 1 tablet (10 mg total) by mouth at bedtime. 30 tablet 11   Multiple Vitamin (MULTIVITAMIN WITH MINERALS) TABS tablet Take 1 tablet by mouth daily.     NARCAN 4 MG/0.1ML LIQD nasal spray kit Place 0.4 mg into the nose once.     nystatin (MYCOSTATIN) 100000 UNIT/ML suspension Use 4-6 cc four times daily and continue for 48 hours after symptoms clear (Patient not taking: Reported on 03/03/2021) 240 mL 0   ONETOUCH DELICA LANCETS FINE MISC Check blood sugar 3 times a day 100 each 12   ONETOUCH VERIO test strip  CHECK BLOOD SUGAR 3 TIMES A DAY 100 each 5  oxyCODONE-acetaminophen (PERCOCET) 10-325 MG tablet Take 1 tablet by mouth 4 (four) times daily as needed for pain.     rosuvastatin (CRESTOR) 10 MG tablet Take 1 tablet (10 mg total) by mouth daily. 90 tablet 3   Semaglutide, 2 MG/DOSE, (OZEMPIC, 2 MG/DOSE,) 8 MG/3ML SOPN Inject 2 mg into the skin once a week. 9 mL 3   valACYclovir (VALTREX) 500 MG tablet TAKE 1 TABLET BY MOUTH TWICE DAILY FOR 5 DAYS AT  TIME  OF  OUTBREAK 10 tablet 0   No current facility-administered medications for this visit.    Review of Systems: No chest pain. No shortness of breath. Urinary stream is slower than usual but no dysuria. No urinary complaints.   PHYSICAL EXAM :    Wt Readings from Last 3 Encounters:  04/08/21 244 lb (110.7 kg)  03/30/21 243 lb (110.2 kg)  03/23/21 242 lb 12.8 oz (110.1 kg)    BP 130/88   Pulse 90   Ht 5' 4"  (1.626 m)   Wt 240 lb (108.9 kg)   SpO2 97%   BMI 41.20 kg/m  Constitutional:  Generally well appearing female in no acute distress. Psychiatric: Pleasant. Normal mood and affect. Behavior is normal. EENT: Pupils normal.  Conjunctivae are normal. No scleral icterus. Neck supple.  Cardiovascular: Normal rate, regular rhythm. No  edema Pulmonary/chest: Effort normal and breath sounds normal. No wheezing, rales or rhonchi. Abdominal: Soft, nondistended, nontender. Bowel sounds active throughout. There are no masses palpable. No hepatomegaly. Neurological: Alert and oriented to person place and time. Skin: Skin is warm and dry. No rashes noted.  Tye Savoy, NP  04/16/2021, 8:46 AM

## 2021-04-17 DIAGNOSIS — K760 Fatty (change of) liver, not elsewhere classified: Secondary | ICD-10-CM | POA: Diagnosis not present

## 2021-04-20 ENCOUNTER — Ambulatory Visit: Payer: PPO | Admitting: Nutrition

## 2021-04-21 ENCOUNTER — Other Ambulatory Visit: Payer: Self-pay | Admitting: Internal Medicine

## 2021-04-21 ENCOUNTER — Other Ambulatory Visit: Payer: Self-pay

## 2021-04-21 ENCOUNTER — Encounter: Payer: PPO | Attending: Internal Medicine | Admitting: Nutrition

## 2021-04-21 DIAGNOSIS — C50411 Malignant neoplasm of upper-outer quadrant of right female breast: Secondary | ICD-10-CM | POA: Diagnosis not present

## 2021-04-21 DIAGNOSIS — E1140 Type 2 diabetes mellitus with diabetic neuropathy, unspecified: Secondary | ICD-10-CM | POA: Insufficient documentation

## 2021-04-21 NOTE — Patient Instructions (Signed)
Read over pump resource manual and pump manuel today Call office if blood sugars drop below 70, or remain over 250.   Return in 2 weeks to see Dr. Kelton Pillar.

## 2021-04-21 NOTE — Progress Notes (Signed)
Patient was trained on the Dash pump system.  Settings were put in by patient per Dr. Quin Hoop orders:  Basal rate: 3.0u/hr, I/C ratio:1 (pt. Not wanting to count carbs), ISF: 20, timing: 4 hours, target 120 (at patient's request) with correction over 120.   She filled a pod with 200u of Novolog and attached the pod to her upper abdomen without difficulty. The pod was started at [facility].  She re demonstrated how to give to bolus correctly X2, how to do a correction dose, if needed.   Her meal times are variable as well as the amount of carbs in breakfast meal.Sometimes it is a banana, and sometimes a fired chicken biscuit.  Written instructions given for 4u for small breakfast and 10u for larger breakfast, as well as 14u for lunch and supper.She reverablized this correctly. She is using a [employer] with readings going to her reader.  I suggested we send the readings to her phone.  She downloaded the app, but was not able to remember the log in and password used for this.  She will call the help line and try to get this to start with her next sensor changed.   She was told to call the office if blood sugars drop low, or remain high.  She was told to schedule an appointment with Dr. Kelton Pillar in two weeks.  She agreed to do this and signed the checklist as understanding all topics.  I believe she will need another visit with me, so I will see her when she makes an appointment with Dr. Kelton Pillar

## 2021-04-22 ENCOUNTER — Ambulatory Visit: Payer: PPO | Attending: General Surgery

## 2021-04-22 ENCOUNTER — Ambulatory Visit
Admission: RE | Admit: 2021-04-22 | Discharge: 2021-04-22 | Disposition: A | Discharge status: Home / Self Care | Payer: PPO | Source: Ambulatory Visit | Attending: Radiation Oncology | Admitting: Radiation Oncology

## 2021-04-22 DIAGNOSIS — C50411 Malignant neoplasm of upper-outer quadrant of right female breast: Secondary | ICD-10-CM | POA: Diagnosis not present

## 2021-04-22 DIAGNOSIS — Z170 Estrogen receptor positive status [ER+]: Secondary | ICD-10-CM | POA: Insufficient documentation

## 2021-04-22 DIAGNOSIS — R293 Abnormal posture: Secondary | ICD-10-CM | POA: Insufficient documentation

## 2021-04-22 DIAGNOSIS — R600 Localized edema: Secondary | ICD-10-CM | POA: Insufficient documentation

## 2021-04-22 NOTE — Patient Instructions (Addendum)
     [facility]  [address], [address]  [place] [address]  [phone]  After Breast Cancer Class It is recommended you attend the ABC class to be educated on lymphedema risk reduction. This class is free of charge and lasts for 1 hour. It is a 1-time class. You are scheduled for 05/31/2021.  We will send you a link.  It is done on Webex  Scar massage; 2-3 minutes several times/day across incision and circular motions   Compression garment;Call second To Petra Kuba to get appt.  Information for address is on the script you were given.   Home exercise Program:continue post op stretches during radiation; Try clasped hands flexion lying on your back, and hands behind head on your back   Follow up PT: It is recommended you return every 3 months for the first 3 years following surgery to be assessed on the SOZO machine for an L-Dex score. This helps prevent clinically significant lymphedema in 95% of patients. These follow up screens are 10 minute appointments that you are not billed for. You are scheduled for  December 27, at 8:40

## 2021-04-22 NOTE — Therapy (Addendum)
[facility] @ [place], [place], [address] Phone: [phone]   Fax:  [phone]  Physical Therapy Treatment  Patient Details  Name: [name] MRN: [id number] Date of Birth: Sep 08, 1960 Referring Provider (PT): Dr. Autumn Messing   Encounter Date: 04/22/2021   PT End of Session - 04/22/21 0956     Visit Number 2    Number of Visits 12    Date for PT Re-Evaluation 05/20/21    PT Start Time 0850    PT Stop Time 0945    PT Time Calculation (min) 55 min    Activity Tolerance Patient tolerated treatment well    Behavior During Therapy [facility] for tasks assessed/performed             Past Medical History:  Diagnosis Date   Allergy    Arthritis    back    Asthma    AS CHILD   Chronic back pain    Chronic leg pain    due to back pain   Cocaine abuse ([place])    in remission   COPD (chronic obstructive pulmonary disease) ([place])    Diabetes ([place])    with neuropathy   GERD (gastroesophageal reflux disease)    Hyperlipidemia    Hypertension    Intraductal papilloma of left breast 02/18/2016   Neuromuscular disorder ([place])    neuropathy   Postlaminectomy syndrome of lumbar region 12/07/2011   RECTAL BLEEDING 12/09/2008   Annotation: s/p EGD 7/08 mild gastritis, s/p colonoscopy 7/08- benign polyp  s/p polypectomy and isolated diverticulum.  Qualifier: Diagnosis of  By: Ditzler RN, Debra     Sciatica    per patient    Sleep apnea    CPAP    YEARS AGO DONE 1/2 YEARS AGO AND WAS TOLD DID NOT HAVE   Tobacco abuse    Uterine fibroid    s/p hysterectomy    Past Surgical History:  Procedure Laterality Date   BACK SURGERY  2012   L5-S1 microendoscopic disectomy last surgery 06/2011   BREAST BIOPSY     LEFT    01/19/16   BREAST EXCISIONAL BIOPSY Left 02/2016   BREAST LUMPECTOMY WITH RADIOACTIVE SEED AND SENTINEL LYMPH NODE BIOPSY Right 03/12/2021   Procedure: RIGHT BREAST LUMPECTOMY WITH RADIOACTIVE SEED AND SENTINEL  LYMPH NODE BIOPSY;  Surgeon: Jovita Kussmaul, MD;  Location: [place];  Service: General;  Laterality: Right;   BREAST REDUCTION SURGERY  1982   CATARACT EXTRACTION Right 11/07/2019   COLONOSCOPY     HAND SURGERY     MIDDLE TRIGGER FINGER RIGHT SIDE   RADIOACTIVE SEED GUIDED EXCISIONAL BREAST BIOPSY Left 02/18/2016   Procedure: LEFT RADIOACTIVE SEED GUIDED EXCISIONAL BREAST BIOPSY;  Surgeon: Alphonsa Overall, MD;  Location: [facility];  Service: General;  Laterality: Left;   REDUCTION MAMMAPLASTY Bilateral    TONSILLECTOMY  2008   TOTAL ABDOMINAL HYSTERECTOMY  05/24/2007   hysterectomy   TUBAL LIGATION     UPPER GASTROINTESTINAL ENDOSCOPY      There were no vitals filed for this visit.   Subjective Assessment - 04/22/21 0853     Subjective Surgery went well. She has been doing exercises given at her first visit.  Some tightness through axillary region at times. She does note some right breast swelling and axillary swelling.  sometimes has pain assosciated with it. I have tried several different sports bras but none are comfortable    Pertinent History Patient was diagnosed on  01/19/2021 with right grade I invasive ductal carcinoma breast cancer. It measures 7 mm and is located in the uper outer quadrant. It is ER/PR positive and HER2 negative with a Ki67 of 5%. She had a bilateral breast reduction in 1982.    Patient Stated Goals post op reassessment    Currently in Pain? Yes    Pain Score 6    inteermittent shooting pains, no pain at rest   Pain Location Breast    Pain Orientation Right    Pain Descriptors / Indicators Sharp;Shooting    Pain Type Surgical pain    Pain Onset More than a month ago    Pain Frequency Intermittent    Aggravating Factors  unknown    Pain Relieving Factors unknown    Multiple Pain Sites No                [facility] PT Assessment - 04/22/21 0001       Assessment   Medical Diagnosis Right breast cancer    Referring Provider (PT) Dr. Autumn Messing    Onset Date/Surgical  Date 03/12/21    Hand Dominance Right    Prior Therapy baseline      Precautions   Precaution Comments lymphedema risk      Restrictions   Weight Bearing Restrictions No      Balance Screen   Has the patient fallen in the past 6 months No    Has the patient had a decrease in activity level because of a fear of falling?  No    Is the patient reluctant to leave their home because of a fear of falling?  No      Home Ecologist residence    Living Arrangements Spouse/significant other    Available Help at Discharge Family      Prior Function   Level of Independence Independent    Vocation Part time employment    Vocation Requirements admin asst    Leisure walks at work sometimes 10-15 min      Cognition   Overall Cognitive Status Within Functional Limits for tasks assessed      Observation/Other Assessments   Observations enlarged pores right breast, significant swelling right lateral breast, and axillary region, fibrosis noted under large incision.  Incision well healed.      Posture/Postural Control   Posture/Postural Control Postural limitations    Postural Limitations Forward head;Rounded Shoulders      AROM   Overall AROM Comments Cervical AROM is WNL    Right Shoulder Extension 50 Degrees    Right Shoulder Flexion 140 Degrees    Right Shoulder ABduction 158 Degrees    Right Shoulder Internal Rotation 65 Degrees    Right Shoulder External Rotation 103 Degrees      Strength   Overall Strength Within functional limits for tasks performed               LYMPHEDEMA/ONCOLOGY QUESTIONNAIRE - 04/22/21 0001       Type   Cancer Type Right breast cancer      Surgeries   Lumpectomy Date 03/12/21    Sentinel Lymph Node Biopsy Date 03/12/21    Number Lymph Nodes Removed 5      Treatment   Active Chemotherapy Treatment No    Past Chemotherapy Treatment No    Active Radiation Treatment Yes    Past Radiation Treatment No    Current  Hormone Treatment --   pending   Past Hormone Therapy  No      What other symptoms do you have   Are you Having Heaviness or Tightness Yes    Is it Hard or Difficult finding clothes that fit No    Do you have infections --   took prophylactic doxycycline for right breast x 1 week     Right Upper Extremity Lymphedema   10 cm Proximal to Olecranon Process 37 cm    Olecranon Process 28.8 cm    10 cm Proximal to Ulnar Styloid Process 25.3 cm    Just Proximal to Ulnar Styloid Process 17.6 cm    At Base of 2nd Digit 6.95 cm      Left Upper Extremity Lymphedema   10 cm Proximal to Olecranon Process 36.5 cm    Olecranon Process 28 cm    10 cm Proximal to Ulnar Styloid Process 24 cm    Just Proximal to Ulnar Styloid Process 17.7 cm    At Base of 2nd Digit 6.8 cm                                      PT Long Term Goals - 04/22/21 1105       PT LONG TERM GOAL #1   Title Patient will demonstrate she has regained full shoulder ROM and function post operatively compared to baselines.    Time 8    Period Weeks    Status Achieved    Target Date 04/22/21      PT LONG TERM GOAL #2   Title Pt will have decreased right breast swelling by atleast 50%    Time 4    Period Weeks    Status New    Target Date 05/20/21      PT LONG TERM GOAL #3   Title Pt will be fit for compression bra to decrease right breast swelling    Time 4    Period Weeks    Status New    Target Date 05/20/21      PT LONG TERM GOAL #4   Title Pt will be independent in Right breast MLD    Time 4    Period Weeks    Status New    Target Date 05/20/21      PT LONG TERM GOAL #5   Title Pt will have a good understanding of lymphedema precautions    Time 4    Period Weeks    Status New    Target Date 05/20/21                   Plan - 04/22/21 0957     Clinical Impression Statement Pt was seen for post surgical reassessment. She is s/p Right lumpectomy with SLNB and 0/5 LN's.   She will not be having chemotherapy but will start radiation today, and antiestrogens at a later date.  She is doing quite well with shoulder ROM, and circumference arm measures are without concern.  She does present with significant right breast swelling with fibrosis under her incision.  She will benefit from a compression bra (script given), and education in MLD/Self MLD and possible compression pump. She was scheduled for ABC class and SOZO followup.    Personal Factors and Comorbidities Comorbidity 3+    Comorbidities Right breast Cancer with breast swelling, diabetes, COPD, chronic back/leg pain    Stability/Clinical Decision Making Stable/Uncomplicated    Clinical Decision Making  Low    Rehab Potential Excellent    PT Frequency 2x / week    PT Duration 4 weeks    PT Treatment/Interventions ADLs/Self Care Home Management;Therapeutic exercise;Patient/family education;Manual lymph drainage;Vasopneumatic Device;Passive range of motion;Scar mobilization    PT Next Visit Plan assess swelling, did she get compression bra?, foam?, MLD and self MLD, shoulder ROM prn, STM prn, FLEXI touch?    PT Home Exercise Plan Post op shoulder ROM HEP;supine flexion and stargazer    Consulted and Agree with Plan of Care Patient             Patient will benefit from skilled therapeutic intervention in order to improve the following deficits and impairments:  Postural dysfunction, Decreased knowledge of precautions, Impaired UE functional use, Pain, Decreased scar mobility, Increased edema  Visit Diagnosis: Malignant neoplasm of upper-outer quadrant of right breast in female, estrogen receptor positive ([place])  Abnormal posture  Localized edema     Problem List Patient Active Problem List   Diagnosis Date Noted   Malignant neoplasm of upper-outer quadrant of right breast in female, estrogen receptor positive ([place]) 02/22/2021   Pre-operative respiratory examination 09/04/2020   Body mass index (BMI)  40.0-44.9, adult ([place]) 07/28/2020   Lumbago with sciatica, right side 07/28/2020   Elevated CK 07/11/2020   Dyspnea on exertion 07/11/2020   Fracture of base of fifth metatarsal bone of right foot at metaphyseal-diaphyseal junction with nonunion 05/07/2019   OSA on CPAP 10/31/2018   Stress incontinence in female 03/09/2017   Hepatic steatosis 08/29/2016   Herpes simplex 03/08/2016   Diabetic neuropathy with neurologic complication ([place]) 53/29/9242   Long term current use of opiate analgesic 07/21/2015   Preventative health care 05/21/2015   COPD GOLD II  05/31/2013   Lung nodule < 6cm on CT 05/31/2013   Chronic cough 12/07/2012   Chronic pain syndrome 02/13/2012   Lumbar radiculopathy 11/29/2011   Type 2 diabetes mellitus with diabetic neuropathy ([place]) 08/25/2011   Back pain 11/15/2010   Hypertension associated with diabetes ([place]) 07/07/2009   GERD 01/08/2009   Dyslipidemia associated with type 2 diabetes mellitus ([place]) 12/09/2008   Hyperlipidemia 12/09/2008   Morbid obesity due to excess calories ([place]) 12/09/2008  PHYSICAL THERAPY DISCHARGE SUMMARY  Visits from Start of Care: 2  Current functional level related to goals / functional outcomes: Unknown Pt did not return   Remaining deficits: Unknown, did not return   Education / Equipment: Educated on Compression bra, Sozo screens, ABC class and POC for right breast treatment but did not return.   Patient agrees to discharge. Patient goals were  not assessed . Patient is being discharged due to not returning since the last visit.   Claris Pong, PT 04/22/2021, 12:00 PM  [facility] @ [place], [place], [address] Phone: [phone]   Fax:  [phone]  Name: [name] MRN: [id number] Date of Birth: Nov 10, 1960

## 2021-04-23 ENCOUNTER — Ambulatory Visit
Admission: RE | Admit: 2021-04-23 | Discharge: 2021-04-23 | Disposition: A | Discharge status: Home / Self Care | Payer: PPO | Source: Ambulatory Visit | Attending: Radiation Oncology | Admitting: Radiation Oncology

## 2021-04-23 ENCOUNTER — Telehealth: Payer: Self-pay

## 2021-04-23 ENCOUNTER — Other Ambulatory Visit: Payer: Self-pay

## 2021-04-23 DIAGNOSIS — Z79899 Other long term (current) drug therapy: Secondary | ICD-10-CM | POA: Diagnosis not present

## 2021-04-23 DIAGNOSIS — M5136 Other intervertebral disc degeneration, lumbar region: Secondary | ICD-10-CM | POA: Diagnosis not present

## 2021-04-23 DIAGNOSIS — E119 Type 2 diabetes mellitus without complications: Secondary | ICD-10-CM | POA: Diagnosis not present

## 2021-04-23 DIAGNOSIS — M542 Cervicalgia: Secondary | ICD-10-CM | POA: Diagnosis not present

## 2021-04-23 DIAGNOSIS — M546 Pain in thoracic spine: Secondary | ICD-10-CM | POA: Diagnosis not present

## 2021-04-23 DIAGNOSIS — C50411 Malignant neoplasm of upper-outer quadrant of right female breast: Secondary | ICD-10-CM | POA: Diagnosis not present

## 2021-04-23 DIAGNOSIS — M545 Low back pain, unspecified: Secondary | ICD-10-CM | POA: Diagnosis not present

## 2021-04-23 MED ORDER — RADIAPLEXRX EX GEL: Freq: Once | CUTANEOUS | Status: AC

## 2021-04-23 MED ORDER — ALRA NON-METALLIC DEODORANT (RAD-ONC)
1.0000 | Freq: Once | TOPICAL | Status: AC
Start: 2021-04-23 — End: 2021-04-23
  Administered 2021-04-23: 1 via TOPICAL

## 2021-04-23 MED ORDER — INSULIN ASPART 100 UNIT/ML IJ SOLN: INTRAMUSCULAR | 4 refills | Status: DC

## 2021-04-23 NOTE — Progress Notes (Signed)
Pt here for patient teaching.  Pt given Radiation and You booklet, skin care instructions, Alra deodorant, and Radiaplex gel.  Reviewed areas of pertinence such as fatigue, hair loss, skin changes, breast tenderness, and breast swelling . Pt able to give teach back of to pat skin and use unscented/gentle soap,apply Radiaplex bid, avoid applying anything to skin within 4 hours of treatment, avoid wearing an under wire bra, and to use an electric razor if they must shave. Pt verbalizes understanding of information given and will contact nursing with any questions or concerns.     [email or web address]/treatmentinformation/whattoexpect/index      

## 2021-04-23 NOTE — Telephone Encounter (Signed)
Pharmacy requesting directions on the Novolog. I see direction for Novolog mix and wasn't sure if it should be the same .

## 2021-04-23 NOTE — Telephone Encounter (Signed)
Script sent  

## 2021-04-26 ENCOUNTER — Ambulatory Visit: Payer: PPO

## 2021-04-27 ENCOUNTER — Ambulatory Visit
Admission: RE | Admit: 2021-04-27 | Discharge: 2021-04-27 | Disposition: A | Discharge status: Home / Self Care | Payer: PPO | Source: Ambulatory Visit | Attending: Radiation Oncology | Admitting: Radiation Oncology

## 2021-04-27 ENCOUNTER — Other Ambulatory Visit: Payer: Self-pay

## 2021-04-27 DIAGNOSIS — C50411 Malignant neoplasm of upper-outer quadrant of right female breast: Secondary | ICD-10-CM | POA: Diagnosis not present

## 2021-04-28 ENCOUNTER — Telehealth: Payer: Self-pay | Admitting: Nutrition

## 2021-04-28 ENCOUNTER — Other Ambulatory Visit: Payer: Self-pay | Admitting: Internal Medicine

## 2021-04-28 ENCOUNTER — Encounter: Payer: PPO | Admitting: Physical Therapy

## 2021-04-28 ENCOUNTER — Ambulatory Visit
Admission: RE | Admit: 2021-04-28 | Discharge: 2021-04-28 | Disposition: A | Discharge status: Home / Self Care | Payer: PPO | Source: Ambulatory Visit | Attending: Radiation Oncology | Admitting: Radiation Oncology

## 2021-04-28 DIAGNOSIS — C50411 Malignant neoplasm of upper-outer quadrant of right female breast: Secondary | ICD-10-CM | POA: Diagnosis not present

## 2021-04-28 MED ORDER — INSULIN ASPART 100 UNIT/ML IJ SOLN
INTRAMUSCULAR | 4 refills | Status: DC
Start: 2021-04-28 — End: 2021-05-03

## 2021-04-28 NOTE — Telephone Encounter (Signed)
Patient reported no difficulty giving boluses last night or this AM.  Denies low blood sugars.  Says FBS today was 184. Had no questions for me at this time.  Suggested we meet next week to review some things on pump.  Will call her next week to schedule this  04/27/21  Message left yesterday and today to call me.

## 2021-04-28 NOTE — Telephone Encounter (Signed)
Script was sent on 04/23/2021 and resent today

## 2021-04-28 NOTE — Telephone Encounter (Signed)
Patient reports that she is "loving her pump".  IS bolusing before all meals, has no difficulty changing pods, and using the PDM.  She had no questions for me at this time. Says FBSs are around 140-150, with only 1 low of 84.   She says there is no insulin called in at the drug store and requestions Novolog in a vial be called in at [facility] on [address]

## 2021-04-28 NOTE — Telephone Encounter (Signed)
Will do. Thank you

## 2021-04-29 ENCOUNTER — Ambulatory Visit
Admission: RE | Admit: 2021-04-29 | Discharge: 2021-04-29 | Disposition: A | Discharge status: Home / Self Care | Payer: PPO | Source: Ambulatory Visit | Attending: Radiation Oncology | Admitting: Radiation Oncology

## 2021-04-29 ENCOUNTER — Other Ambulatory Visit: Payer: Self-pay

## 2021-04-29 DIAGNOSIS — C50411 Malignant neoplasm of upper-outer quadrant of right female breast: Secondary | ICD-10-CM | POA: Diagnosis not present

## 2021-04-29 DIAGNOSIS — Z79899 Other long term (current) drug therapy: Secondary | ICD-10-CM | POA: Diagnosis not present

## 2021-04-29 DIAGNOSIS — E1140 Type 2 diabetes mellitus with diabetic neuropathy, unspecified: Secondary | ICD-10-CM | POA: Diagnosis not present

## 2021-04-30 ENCOUNTER — Ambulatory Visit
Admission: RE | Admit: 2021-04-30 | Discharge: 2021-04-30 | Disposition: A | Discharge status: Home / Self Care | Payer: PPO | Source: Ambulatory Visit | Attending: Radiation Oncology | Admitting: Radiation Oncology

## 2021-04-30 ENCOUNTER — Telehealth: Payer: Self-pay

## 2021-04-30 DIAGNOSIS — C50411 Malignant neoplasm of upper-outer quadrant of right female breast: Secondary | ICD-10-CM | POA: Diagnosis not present

## 2021-04-30 NOTE — Telephone Encounter (Signed)
Patient came into the office regarding rx for ozempic she stated her pharmacy is having trouble getting the rx she is requesting a alternative rx if possible [facility] ([place]), [place] - [address]   call [phone]

## 2021-05-02 ENCOUNTER — Ambulatory Visit: Payer: PPO

## 2021-05-03 ENCOUNTER — Ambulatory Visit: Admission: RE | Admit: 2021-05-03 | Payer: PPO | Source: Ambulatory Visit

## 2021-05-03 ENCOUNTER — Other Ambulatory Visit (HOSPITAL_COMMUNITY): Payer: Self-pay

## 2021-05-03 ENCOUNTER — Other Ambulatory Visit: Payer: Self-pay

## 2021-05-03 MED ORDER — INSULIN ASPART 100 UNIT/ML IJ SOLN
150.0000 [IU] | Freq: Every day | INTRAMUSCULAR | 4 refills | Status: DC
Start: 2021-05-03 — End: 2021-05-05
  Filled 2021-05-03: qty 130, 86d supply, fill #0

## 2021-05-03 MED ORDER — OZEMPIC (2 MG/DOSE) 8 MG/3ML ~~LOC~~ SOPN
2.0000 mg | PEN_INJECTOR | SUBCUTANEOUS | 3 refills | Status: DC
  Filled 2021-05-03: qty 3, 28d supply, fill #0

## 2021-05-03 NOTE — Telephone Encounter (Signed)
Medication has been sent to [facility] to see if they will have scripts. Patient has appt on 05/05/2021 and will let us know at that time if they don't have it

## 2021-05-03 NOTE — Addendum Note (Signed)
Addended by: Jefferson Fuel on: 05/03/2021 10:57 AM   Modules accepted: Orders

## 2021-05-04 ENCOUNTER — Ambulatory Visit: Payer: PPO

## 2021-05-04 DIAGNOSIS — G4733 Obstructive sleep apnea (adult) (pediatric): Secondary | ICD-10-CM | POA: Diagnosis not present

## 2021-05-05 ENCOUNTER — Telehealth: Payer: Self-pay

## 2021-05-05 ENCOUNTER — Ambulatory Visit: Payer: PPO

## 2021-05-05 ENCOUNTER — Ambulatory Visit: Payer: PPO | Admitting: Internal Medicine

## 2021-05-05 ENCOUNTER — Encounter: Payer: Self-pay | Admitting: Internal Medicine

## 2021-05-05 ENCOUNTER — Other Ambulatory Visit: Payer: Self-pay

## 2021-05-05 ENCOUNTER — Ambulatory Visit (INDEPENDENT_AMBULATORY_CARE_PROVIDER_SITE_OTHER): Payer: PPO | Admitting: Internal Medicine

## 2021-05-05 VITALS — BP 120/74 | HR 78 | Ht 64.0 in | Wt 244.0 lb

## 2021-05-05 DIAGNOSIS — E1165 Type 2 diabetes mellitus with hyperglycemia: Secondary | ICD-10-CM | POA: Diagnosis not present

## 2021-05-05 DIAGNOSIS — Z794 Long term (current) use of insulin: Secondary | ICD-10-CM | POA: Diagnosis not present

## 2021-05-05 LAB — POCT GLYCOSYLATED HEMOGLOBIN (HGB A1C): Hemoglobin A1C: 8.2 % — AB (ref 4.0–5.6)

## 2021-05-05 MED ORDER — INSULIN ASPART 100 UNIT/ML IJ SOLN: INTRAMUSCULAR | 3 refills | Status: DC

## 2021-05-05 NOTE — Patient Instructions (Signed)
-   Keep Up the Good Work ! - Continue Metformin 1 tablet Twice daily  - Continue  Ozempic 2  mg weekly  - Continue  Farxiga 10 mg , 1 tablet with breakfast      HOW TO TREAT LOW BLOOD SUGARS (Blood sugar LESS THAN 70 MG/DL) Please follow the RULE OF 15 for the treatment of hypoglycemia treatment (when your (blood sugars are less than 70 mg/dL)   STEP 1: Take 15 grams of carbohydrates when your blood sugar is low, which includes:  3-4 GLUCOSE TABS  OR 3-4 OZ OF JUICE OR REGULAR SODA OR ONE TUBE OF GLUCOSE GEL    STEP 2: RECHECK blood sugar in 15 MINUTES STEP 3: If your blood sugar is still low at the 15 minute recheck --> then, go back to STEP 1 and treat AGAIN with another 15 grams of carbohydrates.

## 2021-05-05 NOTE — Progress Notes (Signed)
Name: [name]  Age/ Sex: 60 y.o., female   MRN/ DOB: [id number], 09/30/1960     PCP: Horald Pollen, MD   Reason for Endocrinology Evaluation: Type 2 Diabetes Mellitus  Initial Endocrine Consultative Visit: 03/18/2019    PATIENT IDENTIFIER: [name] is a 60 y.o. female with a past medical history of T2DM, HTN, OSA and dyslipidemia . The patient has followed with Endocrinology clinic since 03/18/2019 for consultative assistance with management of her diabetes.  DIABETIC HISTORY:  [name] was diagnosed with T2DM many years ago. Has been on Soliqua, Glipizide and V-Go was started in 2019.  Her hemoglobin A1c has ranged from 8.1% in 2016, peaking at 9.8% in 2020.  On her initial visit to our clinic, she was on V-Go 40 with Humulin U-500 , Metformin, and Ozempic with an A1c 9.7% . We stopped the V-Go  And the U-500 due to recurrent hypoglycemia . We started Novolog Mix and continued metformin and Ozempic.   SGLT-2 inhibitor started through PCP 03/2020  SUBJECTIVE:   During the last visit (11/06/2020): A1c 8.3% .  We increase Farxiga, continued Ozempic, metformin, and NovoLog Mix     Today (05/05/2021): [name] is here for a follow up on her diabetes care.  She checks her blood sugars multiple times daily, through freestyle libre. The patient has not  had hypoglycemic episodes since the last clinic visit.   Denies nausea , has occasional loose stools  She was diagnosed with Breast Ca, S/P lumpectomy , undergoing radiation     This patient with type 2 diabetes is treated with Omnipod  (insulin pump). During the visit the pump basal and bolus doses were reviewed including carb/insulin rations and supplemental doses. The clinical list was updated. The glucose meter download was reviewed in detail to determine if the current pump settings are providing the best glycemic control without excessive hypoglycemia.  Pump and meter download:    Pump   Omnipod Settings    Insulin type   Novolog    Basal rate       0000 3u/h       I:C ratio       0000 1:1                  Sensitivity       0000  20      Goal       0000  120            Type & Model of Pump: Omnipod Insulin Type: Currently using Novolog .  Body mass index is 41.88 kg/m.  PUMP STATISTICS: Average BG: 155  BG Readings: 3.4  / day Average Daily Carbs (g): 32.5 Average Total Daily Insulin: 106.2  Average Daily Basal: 69.3 (65 %) Average Daily Bolus: 36.9 (35 %)  She enters 10 units with a full breakfast and 4 units if eats a banana /coffee Enters 14 for lunch and supper   HOME DIABETES REGIMEN:  Metformin 1000 tablet Twice daily  Ozempic 2 mg weekly ( Fridays) Novolog Farxiga 10 mg daily     CONTINUOUS GLUCOSE MONITORING RECORD INTERPRETATION    Dates of Recording:11/10-11/23/2022 Sensor description: [employer]  Results statistics:   CGM use % of time 82  Average and SD 146/24.4  Time in range  82%  % Time Above 180 17  % Time above 250 0  % Time Below target 1    Glycemic patterns summary: OPtimal BG's during  the day and night   Hypoglycemic episodes occurred during the day after a bolus  Overnight periods: trends down      DIABETIC COMPLICATIONS: Microvascular complications:  Neuropathy  Denies: CKD, retinopathy  Last eye exam: Completed 07/21/2020   Macrovascular complications:    Denies: CAD, PVD, CVA    HISTORY:  Past Medical History:  Past Medical History:  Diagnosis Date   Allergy    Arthritis    back    Asthma    AS CHILD   Chronic back pain    Chronic leg pain    due to back pain   Cocaine abuse ([place])    in remission   COPD (chronic obstructive pulmonary disease) ([place])    Diabetes ([place])    with neuropathy   GERD (gastroesophageal reflux disease)    Hyperlipidemia    Hypertension    Intraductal papilloma of left breast 02/18/2016   Neuromuscular disorder ([facility])    neuropathy   Postlaminectomy syndrome of  lumbar region 12/07/2011   RECTAL BLEEDING 12/09/2008   Annotation: s/p EGD 7/08 mild gastritis, s/p colonoscopy 7/08- benign polyp  s/p polypectomy and isolated diverticulum.  Qualifier: Diagnosis of  By: Ditzler RN, Debra     Sciatica    per patient    Sleep apnea    CPAP    YEARS AGO DONE 1/2 YEARS AGO AND WAS TOLD DID NOT HAVE   Tobacco abuse    Uterine fibroid    s/p hysterectomy   Past Surgical History:  Past Surgical History:  Procedure Laterality Date   BACK SURGERY  2012   L5-S1 microendoscopic disectomy last surgery 06/2011   BREAST BIOPSY     LEFT    01/19/16   BREAST EXCISIONAL BIOPSY Left 02/2016   BREAST LUMPECTOMY WITH RADIOACTIVE SEED AND SENTINEL LYMPH NODE BIOPSY Right 03/12/2021   Procedure: RIGHT BREAST LUMPECTOMY WITH RADIOACTIVE SEED AND SENTINEL LYMPH NODE BIOPSY;  Surgeon: Jovita Kussmaul, MD;  Location: [facility];  Service: General;  Laterality: Right;   BREAST REDUCTION SURGERY  1982   CATARACT EXTRACTION Right 11/07/2019   COLONOSCOPY     HAND SURGERY     MIDDLE TRIGGER FINGER RIGHT SIDE   RADIOACTIVE SEED GUIDED EXCISIONAL BREAST BIOPSY Left 02/18/2016   Procedure: LEFT RADIOACTIVE SEED GUIDED EXCISIONAL BREAST BIOPSY;  Surgeon: Alphonsa Overall, MD;  Location: [facility];  Service: General;  Laterality: Left;   REDUCTION MAMMAPLASTY Bilateral    TONSILLECTOMY  2008   TOTAL ABDOMINAL HYSTERECTOMY  05/24/2007   hysterectomy   TUBAL LIGATION     UPPER GASTROINTESTINAL ENDOSCOPY     Social History:  reports that she quit smoking about 24 years ago. Her smoking use included cigarettes. She has a 10.00 pack-year smoking history. She has never used smokeless tobacco. She reports that she does not drink alcohol and does not use drugs. Family History:  Family History  Problem Relation Age of Onset   Diabetes Father    Heart disease Father    Hypertension Father    Diabetes Mother    Cancer Mother        brain   Hypertension Mother    Kidney disease Sister    Diabetes  Sister    Kidney cancer Sister    Stroke Sister    Diabetes Sister    Hypertension Sister    Diabetes Sister    Hypertension Sister    Obesity Son    Colon cancer Neg Hx    Colon polyps Neg  Hx    Esophageal cancer Neg Hx    Rectal cancer Neg Hx    Stomach cancer Neg Hx      HOME MEDICATIONS: Allergies as of 05/05/2021       Reactions   Lisinopril Cough        Medication List        Accurate as of May 05, 2021  9:31 AM. If you have any questions, ask your nurse or doctor.          STOP taking these medications    nystatin 100000 UNIT/ML suspension Commonly known as: MYCOSTATIN Stopped by: Dorita Sciara, MD       TAKE these medications    acyclovir 400 MG tablet Commonly known as: ZOVIRAX TAKE 1 TABLET BY MOUTH THREE TIMES DAILY AS NEEDED (FOR  FLARES) What changed: See the new instructions.   albuterol 108 (90 Base) MCG/ACT inhaler Commonly known as: VENTOLIN HFA Inhale 2 puffs into the lungs every 6 (six) hours as needed for wheezing or shortness of breath.   Belbuca 600 MCG Film Generic drug: Buprenorphine HCl Take 1 strip by mouth 2 (two) times daily. What changed: Another medication with the same name was removed. Continue taking this medication, and follow the directions you see here. Changed by: Dorita Sciara, MD   budesonide-formoterol 80-4.5 MCG/ACT inhaler Commonly known as: Symbicort Inhale 2 puffs then rinse mouth, twice daily- maintenance   chlorpheniramine 4 MG tablet Commonly known as: CHLOR-TRIMETON Take 8 mg by mouth in the morning and at bedtime.   Continuous Glucose Monitor Devi Use as directed.   dapagliflozin propanediol 10 MG Tabs tablet Commonly known as: Farxiga Take 1 tablet (10 mg total) by mouth daily.   diclofenac Sodium 1 % Gel Commonly known as: VOLTAREN Apply 2 g topically 4 (four) times daily. Rub into affected area of foot 2 to 4 times daily What changed:  when to take this reasons to  take this additional instructions   dicyclomine 10 MG capsule Commonly known as: BENTYL Take 1 capsule (10 mg total) by mouth 2 (two) times daily as needed for spasms.   esomeprazole 40 MG capsule Commonly known as: NEXIUM Take 1 capsule (40 mg total) by mouth 2 (two) times daily before a meal.   ezetimibe 10 MG tablet Commonly known as: Zetia Take 1 tablet (10 mg total) by mouth daily.   FreeStyle Libre 2 Sensor Misc USE TO CHECK BLOOD SUGARS FOUR TIMES DAILY.   gabapentin 600 MG tablet Commonly known as: NEURONTIN Take 600 mg by mouth 2 (two) times daily.   hydrochlorothiazide 25 MG tablet Commonly known as: HYDRODIURIL Take 1 tablet by mouth once daily   HYDROcodone-acetaminophen 5-325 MG tablet Commonly known as: NORCO/VICODIN Take 1 tablet by mouth every 6 (six) hours as needed for moderate pain or severe pain.   insulin aspart 100 UNIT/ML injection Commonly known as: NovoLOG Use as directed in insulin pump. Max of 150 Units daily.   INSULIN SYRINGE 1CC/30GX1/2" 30G X 1/2" 1 ML Misc Use to fill Vgo daily   irbesartan 300 MG tablet Commonly known as: Avapro Take 1 tablet (300 mg total) by mouth daily.   metFORMIN 1000 MG tablet Commonly known as: GLUCOPHAGE TAKE 1 TABLET BY MOUTH TWICE DAILY WITH  A  MEAL   montelukast 10 MG tablet Commonly known as: SINGULAIR Take 1 tablet (10 mg total) by mouth at bedtime.   multivitamin with minerals Tabs tablet Take 1 tablet by mouth daily.  Narcan 4 MG/0.1ML Liqd nasal spray kit Generic drug: naloxone Place 0.4 mg into the nose once.   Omnipod DASH Intro (Gen 4) Kit 1 Device by Does not apply route every 3 (three) days.   Omnipod DASH Pods (Gen 4) Misc 1 Device by Does not apply route every 3 (three) days.   OneTouch Delica Lancets Fine Misc Check blood sugar 3 times a day   OneTouch Verio Flex System w/Device Kit 1 each by Does not apply route 3 (three) times daily.   OneTouch Verio test strip Generic  drug: glucose blood CHECK BLOOD SUGAR 3 TIMES A DAY   oxyCODONE-acetaminophen 10-325 MG tablet Commonly known as: PERCOCET Take 1 tablet by mouth 4 (four) times daily as needed for pain.   Ozempic (2 MG/DOSE) 8 MG/3ML Sopn Generic drug: Semaglutide (2 MG/DOSE) Inject 2 mg into the skin once a week.   pravastatin 40 MG tablet Commonly known as: PRAVACHOL Take 40 mg by mouth daily.   rosuvastatin 10 MG tablet Commonly known as: Crestor Take 1 tablet (10 mg total) by mouth daily.   valACYclovir 500 MG tablet Commonly known as: VALTREX TAKE 1 TABLET BY MOUTH TWICE DAILY FOR 5 DAYS AT  TIME  OF  OUTBREAK         OBJECTIVE:   Vital Signs: BP 120/74 (BP Location: Left Arm, Patient Position: Sitting, Cuff Size: Large)   Pulse 78   Ht _0  (1.626 m)   Wt 244 lb (110.7 kg)   SpO2 99%   BMI 41.88 kg/m   Wt Readings from Last 3 Encounters:  05/05/21 244 lb (110.7 kg)  04/16/21 240 lb (108.9 kg)  04/08/21 244 lb (110.7 kg)     Exam: General:  NAD  Lungs: Clear with good BS bilat with no rales, rhonchi, or wheezes  Heart: RRR with normal S1 and S2 and no gallops; no murmurs; no rub  Extremities: No pretibial edema.     DM foot exam: 07/06/2020  The skin of the feet is intact without sores or ulcerations. The pedal pulses are 2+ on right and 2+ on left. The sensation is intact to a screening 5.07, 10 gram monofilament bilaterally          DATA REVIEWED:  Lab Results  Component Value Date   HGBA1C 8.2 (A) 05/05/2021   HGBA1C 8.3 (A) 02/19/2021   HGBA1C 8.4 (A) 09/15/2020   Lab Results  Component Value Date   MICROALBUR <0.7 02/19/2021   LDLCALC 74 02/19/2021   CREATININE 0.96 02/24/2021   Lab Results  Component Value Date   MICRALBCREAT 1.2 02/19/2021     Lab Results  Component Value Date   CHOL 131 02/19/2021   HDL 34.50 (L) 02/19/2021   LDLCALC 74 02/19/2021   TRIG 111.0 02/19/2021   CHOLHDL 4 02/19/2021        Results for [name], [name]  (MRN [id number]) as of 02/19/2021 17:20  Ref. Range 02/19/2021 09:13  Sodium Latest Ref Range: 135 - 145 mEq/L 139  Potassium Latest Ref Range: 3.5 - 5.1 mEq/L 4.0  Chloride Latest Ref Range: 96 - 112 mEq/L 98  CO2 Latest Ref Range: 19 - 32 mEq/L 31  Glucose Latest Ref Range: 70 - 99 mg/dL 175 (H)  BUN Latest Ref Range: 6 - 23 mg/dL 12  Creatinine Latest Ref Range: 0.40 - 1.20 mg/dL 0.71  Calcium Latest Ref Range: 8.4 - 10.5 mg/dL 9.6  GFR Latest Ref Range: >60.00 mL/min 92.34  Total CHOL/HDL Ratio  Unknown 4  Cholesterol Latest Ref Range: 0 - 200 mg/dL 131  HDL Cholesterol Latest Ref Range: >39.00 mg/dL 34.50 (L)  LDL (calc) Latest Ref Range: 0 - 99 mg/dL 74  MICROALB/CREAT RATIO Latest Ref Range: 0.0 - 30.0 mg/g 1.2  NonHDL Unknown 96.06  Triglycerides Latest Ref Range: 0.0 - 149.0 mg/dL 111.0  VLDL Latest Ref Range: 0.0 - 40.0 mg/dL 22.2  Creatinine,U Latest Units: mg/dL 56.0  Microalb, Ur Latest Ref Range: 0.0 - 1.9 mg/dL <0.7     ASSESSMENT / PLAN / RECOMMENDATIONS:   1) Type 2 Diabetes Mellitus, Poorly Controlled , With Neuropathic complications - Most recent A1c of 8.2 %. Goal A1c < 7.0 %.    - A1c stable but in review of her CGM and pump download , BG's are trending down nicely, she is not unable to count carbs but has been entering standing dose of CHO per meal   - NO changes to pump settings - She enters 10 units with a full breakfast and 4 units if eats a banana /coffee Enters 14 for lunch and supper     MEDICATIONS: - Continue Metformin 1000 mg,  1 tablet Twice daily  - Continue Ozempic 2 mg weekly  - Continue Farxiga 10 mg daily  - Novolog per pump   EDUCATION / INSTRUCTIONS: BG monitoring instructions: Patient is instructed to check her blood sugars 2 times a day, fasting and bedtime . Call [facility] if: BG persistently < 70  I reviewed the Rule of 15 for the treatment of hypoglycemia in detail with the patient. Literature supplied.   2.  Dyslipidemia :   She was on Atrovastatin at somepoint but developed leg pains and we switched to Rosuvastatin   Medication  Continue rosuvastatin 10 mg daily     F/U in  4 months    Signed electronically by: Mack Guise, MD  [facility] End[facility]  [address] [place], [place] [address] Phone: [phone] FAX: [phone]   CC: Horald Pollen, [place] [address] Phone: [phone]  Fax: [phone]  Return to Endocrinology clinic as below: Future Appointments  Date Time Provider [place]  05/10/2021 10:00 AM [place] None  05/11/2021 10:00 AM [facility] [id number] [facility] None  05/12/2021 10:00 AM [facility] [id number] [facility] None  05/12/2021 11:00 AM Breedlove Blue, Blaire L, PT [facility] None  05/13/2021 10:00 AM [facility] [id number] [facility] None  05/14/2021  8:00 AM Tevis, Kara R, PT [facility] None  05/14/2021 10:00 AM [facility] [id number] [facility] None  05/14/2021 11:45 AM Kyung Rudd, MD [facility] None  05/17/2021 11:30 AM [facility] LINAC 3 [facility] None  05/18/2021 11:00 AM Tevis, Kara R, PT [facility] None  05/18/2021 11:30 AM [facility] LINAC 3 [facility] None  05/20/2021 11:00 AM Tevis, Kara R, PT [facility] None  05/20/2021 11:30 AM [facility] LINAC 3 [facility] None  05/21/2021 11:30 AM [facility] LINAC 3 [facility] None  05/24/2021 10:45 AM Nicholas Lose, MD [facility] None  05/24/2021 11:30 AM [facility] LINAC 3 [facility] None  05/25/2021  9:00 AM Collie Siad A, PTA [facility] None  05/25/2021 11:30 AM [facility] LINAC 3 [facility] None  05/26/2021 11:30 AM [facility] LINAC 3 [facility] None  05/27/2021 11:30 AM [facility] LINAC 3 [facility] None  05/28/2021  9:00 AM Tevis, Adrian Prince, PT [facility] None  06/01/2021  1:45 PM Trula Slade, DPM [facility]  06/08/2021  8:50 AM  Suanne Marker, PTA [facility] None  06/25/2021  8:30 AM Lance Huaracha, Melanie Crazier, MD [facility] None  08/10/2021  8:40 AM Horald Pollen, MD [facility] None  11/26/2021  9:00 AM Deneise Lever, MD [facility] None

## 2021-05-05 NOTE — Telephone Encounter (Signed)
Patient given sample of Ozempic and Lymjev

## 2021-05-10 ENCOUNTER — Ambulatory Visit
Admission: RE | Admit: 2021-05-10 | Discharge: 2021-05-10 | Disposition: A | Discharge status: Home / Self Care | Payer: PPO | Source: Ambulatory Visit | Attending: Radiation Oncology | Admitting: Radiation Oncology

## 2021-05-10 ENCOUNTER — Other Ambulatory Visit: Payer: Self-pay

## 2021-05-10 DIAGNOSIS — C50411 Malignant neoplasm of upper-outer quadrant of right female breast: Secondary | ICD-10-CM | POA: Diagnosis not present

## 2021-05-11 ENCOUNTER — Ambulatory Visit
Admission: RE | Admit: 2021-05-11 | Discharge: 2021-05-11 | Disposition: A | Discharge status: Home / Self Care | Payer: PPO | Source: Ambulatory Visit | Attending: Radiation Oncology | Admitting: Radiation Oncology

## 2021-05-11 ENCOUNTER — Other Ambulatory Visit (HOSPITAL_COMMUNITY): Payer: Self-pay

## 2021-05-11 DIAGNOSIS — C50411 Malignant neoplasm of upper-outer quadrant of right female breast: Secondary | ICD-10-CM | POA: Diagnosis not present

## 2021-05-12 ENCOUNTER — Encounter: Payer: PPO | Admitting: Physical Therapy

## 2021-05-12 ENCOUNTER — Ambulatory Visit
Admission: RE | Admit: 2021-05-12 | Discharge: 2021-05-12 | Disposition: A | Discharge status: Home / Self Care | Payer: PPO | Source: Ambulatory Visit | Attending: Radiation Oncology | Admitting: Radiation Oncology

## 2021-05-12 ENCOUNTER — Other Ambulatory Visit: Payer: Self-pay

## 2021-05-12 DIAGNOSIS — C50411 Malignant neoplasm of upper-outer quadrant of right female breast: Secondary | ICD-10-CM | POA: Diagnosis not present

## 2021-05-13 ENCOUNTER — Ambulatory Visit
Admission: RE | Admit: 2021-05-13 | Discharge: 2021-05-13 | Disposition: A | Discharge status: Home / Self Care | Payer: PPO | Source: Ambulatory Visit | Attending: Radiation Oncology | Admitting: Radiation Oncology

## 2021-05-13 DIAGNOSIS — Z170 Estrogen receptor positive status [ER+]: Secondary | ICD-10-CM | POA: Diagnosis not present

## 2021-05-13 DIAGNOSIS — C50411 Malignant neoplasm of upper-outer quadrant of right female breast: Secondary | ICD-10-CM | POA: Diagnosis not present

## 2021-05-13 DIAGNOSIS — Z510 Encounter for antineoplastic radiation therapy: Secondary | ICD-10-CM | POA: Insufficient documentation

## 2021-05-13 DIAGNOSIS — G629 Polyneuropathy, unspecified: Secondary | ICD-10-CM | POA: Diagnosis not present

## 2021-05-14 ENCOUNTER — Ambulatory Visit: Payer: PPO | Admitting: Radiation Oncology

## 2021-05-14 ENCOUNTER — Encounter: Payer: PPO | Admitting: Rehabilitation

## 2021-05-14 ENCOUNTER — Other Ambulatory Visit: Payer: Self-pay

## 2021-05-14 ENCOUNTER — Ambulatory Visit
Admission: RE | Admit: 2021-05-14 | Discharge: 2021-05-14 | Disposition: A | Discharge status: Home / Self Care | Payer: PPO | Source: Ambulatory Visit | Attending: Radiation Oncology | Admitting: Radiation Oncology

## 2021-05-14 DIAGNOSIS — Z510 Encounter for antineoplastic radiation therapy: Secondary | ICD-10-CM | POA: Diagnosis not present

## 2021-05-14 DIAGNOSIS — C50411 Malignant neoplasm of upper-outer quadrant of right female breast: Secondary | ICD-10-CM | POA: Diagnosis not present

## 2021-05-16 ENCOUNTER — Ambulatory Visit: Payer: PPO

## 2021-05-17 ENCOUNTER — Other Ambulatory Visit: Payer: Self-pay

## 2021-05-17 ENCOUNTER — Ambulatory Visit
Admission: RE | Admit: 2021-05-17 | Discharge: 2021-05-17 | Disposition: A | Discharge status: Home / Self Care | Payer: PPO | Source: Ambulatory Visit | Attending: Radiation Oncology | Admitting: Radiation Oncology

## 2021-05-17 ENCOUNTER — Ambulatory Visit: Payer: PPO

## 2021-05-17 DIAGNOSIS — Z510 Encounter for antineoplastic radiation therapy: Secondary | ICD-10-CM | POA: Diagnosis not present

## 2021-05-17 DIAGNOSIS — C50411 Malignant neoplasm of upper-outer quadrant of right female breast: Secondary | ICD-10-CM | POA: Diagnosis not present

## 2021-05-18 ENCOUNTER — Ambulatory Visit: Payer: PPO | Admitting: Rehabilitation

## 2021-05-18 ENCOUNTER — Ambulatory Visit: Payer: PPO

## 2021-05-18 ENCOUNTER — Ambulatory Visit
Admission: RE | Admit: 2021-05-18 | Discharge: 2021-05-18 | Disposition: A | Discharge status: Home / Self Care | Payer: PPO | Source: Ambulatory Visit | Attending: Radiation Oncology | Admitting: Radiation Oncology

## 2021-05-18 DIAGNOSIS — Z510 Encounter for antineoplastic radiation therapy: Secondary | ICD-10-CM | POA: Diagnosis not present

## 2021-05-18 DIAGNOSIS — Z170 Estrogen receptor positive status [ER+]: Secondary | ICD-10-CM | POA: Diagnosis not present

## 2021-05-18 DIAGNOSIS — R293 Abnormal posture: Secondary | ICD-10-CM | POA: Insufficient documentation

## 2021-05-18 DIAGNOSIS — R600 Localized edema: Secondary | ICD-10-CM | POA: Diagnosis not present

## 2021-05-18 DIAGNOSIS — C50411 Malignant neoplasm of upper-outer quadrant of right female breast: Secondary | ICD-10-CM | POA: Diagnosis not present

## 2021-05-19 ENCOUNTER — Ambulatory Visit: Payer: PPO

## 2021-05-19 ENCOUNTER — Other Ambulatory Visit: Payer: Self-pay

## 2021-05-19 ENCOUNTER — Ambulatory Visit
Admission: RE | Admit: 2021-05-19 | Discharge: 2021-05-19 | Disposition: A | Discharge status: Home / Self Care | Payer: PPO | Source: Ambulatory Visit | Attending: Radiation Oncology | Admitting: Radiation Oncology

## 2021-05-19 DIAGNOSIS — Z510 Encounter for antineoplastic radiation therapy: Secondary | ICD-10-CM | POA: Diagnosis not present

## 2021-05-19 DIAGNOSIS — C50411 Malignant neoplasm of upper-outer quadrant of right female breast: Secondary | ICD-10-CM | POA: Diagnosis not present

## 2021-05-20 ENCOUNTER — Encounter: Payer: Self-pay | Admitting: *Deleted

## 2021-05-20 ENCOUNTER — Encounter: Payer: PPO | Admitting: Rehabilitation

## 2021-05-20 ENCOUNTER — Telehealth: Payer: Self-pay

## 2021-05-20 ENCOUNTER — Ambulatory Visit
Admission: RE | Admit: 2021-05-20 | Discharge: 2021-05-20 | Disposition: A | Discharge status: Home / Self Care | Payer: PPO | Source: Ambulatory Visit | Attending: Radiation Oncology | Admitting: Radiation Oncology

## 2021-05-20 ENCOUNTER — Ambulatory Visit: Payer: PPO

## 2021-05-20 DIAGNOSIS — C50411 Malignant neoplasm of upper-outer quadrant of right female breast: Secondary | ICD-10-CM | POA: Diagnosis not present

## 2021-05-20 DIAGNOSIS — Z510 Encounter for antineoplastic radiation therapy: Secondary | ICD-10-CM | POA: Diagnosis not present

## 2021-05-20 DIAGNOSIS — Z170 Estrogen receptor positive status [ER+]: Secondary | ICD-10-CM

## 2021-05-20 NOTE — Telephone Encounter (Signed)
Mistake;no call made

## 2021-05-21 ENCOUNTER — Other Ambulatory Visit: Payer: Self-pay

## 2021-05-21 ENCOUNTER — Ambulatory Visit: Payer: PPO

## 2021-05-21 ENCOUNTER — Ambulatory Visit
Admission: RE | Admit: 2021-05-21 | Discharge: 2021-05-21 | Disposition: A | Discharge status: Home / Self Care | Payer: PPO | Source: Ambulatory Visit | Attending: Radiation Oncology | Admitting: Radiation Oncology

## 2021-05-21 DIAGNOSIS — Z510 Encounter for antineoplastic radiation therapy: Secondary | ICD-10-CM | POA: Diagnosis not present

## 2021-05-21 DIAGNOSIS — C50411 Malignant neoplasm of upper-outer quadrant of right female breast: Secondary | ICD-10-CM | POA: Diagnosis not present

## 2021-05-23 NOTE — Assessment & Plan Note (Signed)
02/16/2021:Screening mammogram: possible asymmetry in the right breast. Diagnostic mammogram and [place]: 0.7 cm suspicious mass in the right breast at 10:00. 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  Adjuvant chemotherapy with Taxotere and Cytoxan every 3 weeks x4 cycles (patient refused) 2. adjuvant radiation completed 05/27/21 3.  Followed by adjuvant antiestrogen therapy -------------------------------------------------------------------------------------------------------------------------- Letrozole counseling: We discussed the risks and benefits of anti-estrogen therapy with aromatase inhibitors. These include but not limited to insomnia, hot flashes, mood changes, vaginal dryness, bone density loss, and weight gain. We strongly believe that the benefits far outweigh the risks. Patient understands these risks and consented to starting treatment. Planned treatment duration is 7 years.  RTC in 3 months for SCP visit

## 2021-05-23 NOTE — Progress Notes (Signed)
Patient Care Team: Horald Pollen, MD as PCP - General (Internal Medicine) Shamleffer, Melanie Crazier, MD as Consulting Physician (Endocrinology) [facility] Deneise Lever, MD as Consulting Physician (Pulmonary Disease) Trula Slade, DPM as Consulting Physician (Podiatry) Ladene Artist, MD as Consulting Physician (Gastroenterology) Jovita Kussmaul, MD as Consulting Physician (General Surgery) Nicholas Lose, MD as Consulting Physician (Hematology and Oncology) Kyung Rudd, MD as Consulting Physician (Radiation Oncology) Mauro Kaufmann, RN as Oncology Nurse Navigator Rockwell Germany, RN as Oncology Nurse Navigator  DIAGNOSIS:    ICD-10-CM   1. Malignant neoplasm of upper-outer quadrant of right breast in female, estrogen receptor positive ([place])  C50.411    Z17.0       SUMMARY OF ONCOLOGIC HISTORY: Oncology History  Malignant neoplasm of upper-outer quadrant of right breast in female, estrogen receptor positive ([place])  02/16/2021 Initial Diagnosis   Screening mammogram: possible asymmetry in the right breast. Diagnostic mammogram and [place]: 0.7 cm suspicious mass in the right breast at 10:00. 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%), HER2 negative by FISH, Ki-67 5%   02/24/2021 Cancer Staging   Staging form: Breast, AJCC 8th Edition - Clinical stage from 02/24/2021: Stage IA (cT1b, cN0, cM0, G1, ER+, PR+, HER2-) - Signed by Nicholas Lose, MD on 02/24/2021 Stage prefix: Initial diagnosis Histologic grading system: 3 grade system    03/12/2021 Surgery   Right lumpectomy: Grade 1 IDC 1.2 cm, low-grade DCIS, margins negative, 0/5 lymph nodes negative ER greater than 95%, PR 40%, HER2 negative, Ki-67 5%   03/25/2021 Oncotype testing   Oncotype DX recurrence score: 27, distant recurrence at 9 years: 16%   04/13/2021 -  Chemotherapy   Patient is on Treatment Plan : BREAST TC q21d       CHIEF COMPLIANT: Follow-up of right breast  cancer  INTERVAL HISTORY: [name] is a 60 y.o. with above-mentioned history of right breast cancer having undergone lumpectomy. She presents to the clinic today for follow-up.  She is tolerating radiation reasonably well.  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  This is especially worse when she sleeps at night.  ALLERGIES:  is allergic to lisinopril.  MEDICATIONS:  Current Outpatient Medications  Medication Sig Dispense Refill   acyclovir (ZOVIRAX) 400 MG tablet TAKE 1 TABLET BY MOUTH THREE TIMES DAILY AS NEEDED (FOR  FLARES) (Patient taking differently: Take 400 mg by mouth 2 (two) times daily as needed (flares).) 90 tablet 0   albuterol (VENTOLIN HFA) 108 (90 Base) MCG/ACT inhaler Inhale 2 puffs into the lungs every 6 (six) hours as needed for wheezing or shortness of breath. 18 g 12   BELBUCA 600 MCG FILM Take 1 strip by mouth 2 (two) times daily.     Blood Glucose Monitoring Suppl (ONETOUCH VERIO FLEX SYSTEM) w/Device KIT 1 each by Does not apply route 3 (three) times daily. 1 kit 1   budesonide-formoterol (SYMBICORT) 80-4.5 MCG/ACT inhaler Inhale 2 puffs then rinse mouth, twice daily- maintenance 10.2 g 12   chlorpheniramine (CHLOR-TRIMETON) 4 MG tablet Take 8 mg by mouth in the morning and at bedtime.     Continuous Blood Gluc Sensor (FREESTYLE LIBRE 2 SENSOR) MISC USE TO CHECK BLOOD SUGARS FOUR TIMES DAILY. 2 each 0   Continuous Glucose Monitor DEVI Use as directed. 1 each 0   dapagliflozin propanediol (FARXIGA) 10 MG TABS tablet Take 1 tablet (10 mg total)  by mouth daily. 90 tablet 3   diclofenac Sodium (VOLTAREN) 1 % GEL Apply 2 g topically 4 (four) times daily. Rub into affected area of foot 2 to 4 times daily (Patient taking differently: Apply 2 g topically 4 (four) times daily as needed (foot pain).) 100 g 2   dicyclomine (BENTYL) 10 MG capsule Take 1 capsule (10 mg total) by mouth 2 (two)  times daily as needed for spasms. 60 capsule 6   esomeprazole (NEXIUM) 40 MG capsule Take 1 capsule (40 mg total) by mouth 2 (two) times daily before a meal. 60 capsule 5   ezetimibe (ZETIA) 10 MG tablet Take 1 tablet (10 mg total) by mouth daily. 30 tablet 11   gabapentin (NEURONTIN) 600 MG tablet Take 600 mg by mouth 2 (two) times daily.     hydrochlorothiazide (HYDRODIURIL) 25 MG tablet Take 1 tablet by mouth once daily 90 tablet 1   HYDROcodone-acetaminophen (NORCO/VICODIN) 5-325 MG tablet Take 1 tablet by mouth every 6 (six) hours as needed for moderate pain or severe pain. 15 tablet 0   insulin aspart (NOVOLOG) 100 UNIT/ML injection Max daily 120 units daily 110 mL 3   Insulin Disposable Pump (OMNIPOD DASH INTRO, GEN 4,) KIT 1 Device by Does not apply route every 3 (three) days. 1 kit 0   Insulin Disposable Pump (OMNIPOD DASH PODS, GEN 4,) MISC 1 Device by Does not apply route every 3 (three) days. 6 each 3   Insulin Syringe-Needle U-100 (INSULIN SYRINGE 1CC/30GX1/2") 30G X 1/2" 1 ML MISC Use to fill Vgo daily 100 each 5   irbesartan (AVAPRO) 300 MG tablet Take 1 tablet (300 mg total) by mouth daily. 90 tablet 0   metFORMIN (GLUCOPHAGE) 1000 MG tablet TAKE 1 TABLET BY MOUTH TWICE DAILY WITH  A  MEAL 180 tablet 0   montelukast (SINGULAIR) 10 MG tablet Take 1 tablet (10 mg total) by mouth at bedtime. 30 tablet 11   Multiple Vitamin (MULTIVITAMIN WITH MINERALS) TABS tablet Take 1 tablet by mouth daily.     NARCAN 4 MG/0.1ML LIQD nasal spray kit Place 0.4 mg into the nose once.     ONETOUCH DELICA LANCETS FINE MISC Check blood sugar 3 times a day 100 each 12   ONETOUCH VERIO test strip  CHECK BLOOD SUGAR 3 TIMES A DAY 100 each 5   oxyCODONE-acetaminophen (PERCOCET) 10-325 MG tablet Take 1 tablet by mouth 4 (four) times daily as needed for pain.     pravastatin (PRAVACHOL) 40 MG tablet Take 40 mg by mouth daily.     rosuvastatin (CRESTOR) 10 MG tablet Take 1 tablet (10 mg total) by mouth daily.  90 tablet 3   Semaglutide, 2 MG/DOSE, (OZEMPIC, 2 MG/DOSE,) 8 MG/3ML SOPN Inject 2 mg into the skin once a week. 9 mL 3   valACYclovir (VALTREX) 500 MG tablet TAKE 1 TABLET BY MOUTH TWICE DAILY FOR 5 DAYS AT  TIME  OF  OUTBREAK 10 tablet 0   No current facility-administered medications for this visit.    PHYSICAL EXAMINATION: ECOG PERFORMANCE STATUS: 1 - Symptomatic but completely ambulatory  Vitals:   05/24/21 1045  BP: 140/64  Pulse: 73  Resp: 18  Temp: 97.7 F (36.5 C)  SpO2: 98%   Filed Weights   05/24/21 1045  Weight: 243 lb 12.8 oz (110.6 kg)    BREAST: No palpable masses or nodules in either right or left breasts. No palpable axillary supraclavicular or infraclavicular adenopathy no breast tenderness or nipple discharge. (  exam performed in the presence of a chaperone)  LABORATORY DATA:  I have reviewed the data as listed CMP Latest Ref Rng & Units 02/24/2021 02/19/2021 11/06/2020  Glucose 70 - 99 mg/dL 263(H) 175(H) 154(H)  BUN 6 - 20 mg/dL 11 12 12   Creatinine 0.44 - 1.00 mg/dL 0.96 0.71 0.68  Sodium 135 - 145 mmol/L 138 139 139  Potassium 3.5 - 5.1 mmol/L 3.9 4.0 3.8  Chloride 98 - 111 mmol/L 96(L) 98 100  CO2 22 - 32 mmol/L 27 31 29   Calcium 8.9 - 10.3 mg/dL 10.3 9.6 9.9  Total Protein 6.5 - 8.1 g/dL 8.3(H) - -  Total Bilirubin 0.3 - 1.2 mg/dL 0.4 - -  Alkaline Phos 38 - 126 U/L 63 - -  AST 15 - 41 U/L 27 - -  ALT 0 - 44 U/L 30 - -    Lab Results  Component Value Date   WBC 7.9 02/24/2021   HGB 15.2 (H) 02/24/2021   HCT 47.4 (H) 02/24/2021   MCV 85.1 02/24/2021   PLT 269 02/24/2021   NEUTROABS 2.9 02/24/2021    ASSESSMENT & PLAN:  Malignant neoplasm of upper-outer quadrant of right breast in female, estrogen receptor positive ([place]) 02/16/2021:Screening mammogram: possible asymmetry in the right breast. Diagnostic mammogram and [place]: 0.7 cm suspicious mass in the right breast at 10:00. 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%), HER2 negative by FISH,  Ki-67 5%   03/12/2021:Right lumpectomy: Grade 1 IDC 1.2 cm, low-grade DCIS, margins negative, 0/5 lymph nodes negative ER greater than 95%, PR 40%, HER2 negative, Ki-67 5%   Oncotype DX recurrence score: 27, distant recurrence at 9 years: 16%   Treatment plan: 1.  Adjuvant chemotherapy with Taxotere and Cytoxan every 3 weeks x4 cycles (patient refused) 2. adjuvant radiation completed 05/27/21 3.  Followed by adjuvant antiestrogen therapy -------------------------------------------------------------------------------------------------------------------------- Letrozole counseling: We discussed the risks and benefits of anti-estrogen therapy with aromatase inhibitors. These include but not limited to insomnia, hot flashes, mood changes, vaginal dryness, bone density loss, and weight gain. We strongly believe that the benefits far outweigh the risks. Patient understands these risks and consented to starting treatment. Planned treatment duration is 7 years.  Complaining of bilateral peripheral neuropathy right greater than left: I will send a referral to neurology.  She might need nerve conduction studies and MRI of her neck.  RTC in 3 months for SCP visit    No orders of the defined types were placed in this encounter.  The patient has a good understanding of the overall plan. she agrees with it. she will call with any problems that may develop before the next visit here.  Total time spent: 20 mins including face to face time and time spent for planning, charting and coordination of care  Rulon Eisenmenger, MD, MPH 05/24/2021  I, Thana Ates, am acting as scribe for Dr. Nicholas Lose.  I have reviewed the above documentation for accuracy and completeness, and I agree with the above.

## 2021-05-24 ENCOUNTER — Ambulatory Visit: Payer: PPO

## 2021-05-24 ENCOUNTER — Inpatient Hospital Stay: Payer: PPO | Admitting: Hematology and Oncology

## 2021-05-24 ENCOUNTER — Other Ambulatory Visit: Payer: Self-pay

## 2021-05-24 ENCOUNTER — Ambulatory Visit
Admission: RE | Admit: 2021-05-24 | Discharge: 2021-05-24 | Disposition: A | Discharge status: Home / Self Care | Payer: PPO | Source: Ambulatory Visit | Attending: Radiation Oncology | Admitting: Radiation Oncology

## 2021-05-24 VITALS — BP 140/64 | HR 73 | Temp 97.7°F | Resp 18 | Ht 64.0 in | Wt 243.8 lb

## 2021-05-24 DIAGNOSIS — G629 Polyneuropathy, unspecified: Secondary | ICD-10-CM | POA: Insufficient documentation

## 2021-05-24 DIAGNOSIS — C50411 Malignant neoplasm of upper-outer quadrant of right female breast: Secondary | ICD-10-CM

## 2021-05-24 DIAGNOSIS — Z170 Estrogen receptor positive status [ER+]: Secondary | ICD-10-CM | POA: Insufficient documentation

## 2021-05-24 DIAGNOSIS — Z510 Encounter for antineoplastic radiation therapy: Secondary | ICD-10-CM | POA: Diagnosis not present

## 2021-05-24 MED ORDER — LETROZOLE 2.5 MG PO TABS: 2.5000 mg | ORAL_TABLET | Freq: Every day | ORAL | 3 refills | Status: DC

## 2021-05-25 ENCOUNTER — Ambulatory Visit: Payer: PPO

## 2021-05-25 ENCOUNTER — Ambulatory Visit
Admission: RE | Admit: 2021-05-25 | Discharge: 2021-05-25 | Disposition: A | Discharge status: Home / Self Care | Payer: PPO | Source: Ambulatory Visit | Attending: Radiation Oncology | Admitting: Radiation Oncology

## 2021-05-25 DIAGNOSIS — M545 Low back pain, unspecified: Secondary | ICD-10-CM | POA: Diagnosis not present

## 2021-05-25 DIAGNOSIS — M5136 Other intervertebral disc degeneration, lumbar region: Secondary | ICD-10-CM | POA: Diagnosis not present

## 2021-05-25 DIAGNOSIS — Z6841 Body Mass Index (BMI) 40.0 and over, adult: Secondary | ICD-10-CM | POA: Diagnosis not present

## 2021-05-25 DIAGNOSIS — E119 Type 2 diabetes mellitus without complications: Secondary | ICD-10-CM | POA: Diagnosis not present

## 2021-05-25 DIAGNOSIS — M542 Cervicalgia: Secondary | ICD-10-CM | POA: Diagnosis not present

## 2021-05-25 DIAGNOSIS — C50411 Malignant neoplasm of upper-outer quadrant of right female breast: Secondary | ICD-10-CM | POA: Diagnosis not present

## 2021-05-25 DIAGNOSIS — M546 Pain in thoracic spine: Secondary | ICD-10-CM | POA: Diagnosis not present

## 2021-05-25 DIAGNOSIS — Z79899 Other long term (current) drug therapy: Secondary | ICD-10-CM | POA: Diagnosis not present

## 2021-05-25 DIAGNOSIS — Z510 Encounter for antineoplastic radiation therapy: Secondary | ICD-10-CM | POA: Diagnosis not present

## 2021-05-26 ENCOUNTER — Other Ambulatory Visit: Payer: Self-pay

## 2021-05-26 ENCOUNTER — Ambulatory Visit
Admission: RE | Admit: 2021-05-26 | Discharge: 2021-05-26 | Disposition: A | Discharge status: Home / Self Care | Payer: PPO | Source: Ambulatory Visit | Attending: Radiation Oncology | Admitting: Radiation Oncology

## 2021-05-26 DIAGNOSIS — C50411 Malignant neoplasm of upper-outer quadrant of right female breast: Secondary | ICD-10-CM | POA: Diagnosis not present

## 2021-05-26 DIAGNOSIS — Z510 Encounter for antineoplastic radiation therapy: Secondary | ICD-10-CM | POA: Diagnosis not present

## 2021-05-27 ENCOUNTER — Encounter: Payer: Self-pay | Admitting: Radiation Oncology

## 2021-05-27 ENCOUNTER — Ambulatory Visit
Admission: RE | Admit: 2021-05-27 | Discharge: 2021-05-27 | Disposition: A | Discharge status: Home / Self Care | Payer: PPO | Source: Ambulatory Visit | Attending: Radiation Oncology | Admitting: Radiation Oncology

## 2021-05-27 DIAGNOSIS — C50411 Malignant neoplasm of upper-outer quadrant of right female breast: Secondary | ICD-10-CM | POA: Diagnosis not present

## 2021-05-27 DIAGNOSIS — Z510 Encounter for antineoplastic radiation therapy: Secondary | ICD-10-CM | POA: Diagnosis not present

## 2021-05-28 ENCOUNTER — Encounter: Payer: PPO | Admitting: Rehabilitation

## 2021-05-31 ENCOUNTER — Encounter: Payer: Self-pay | Admitting: Hematology and Oncology

## 2021-05-31 NOTE — Progress Notes (Signed)
° °                                                                                                                                                          °  Patient Name: [name] MRN: [id number] DOB: September 29, 1960 Referring Physician: Agustina Caroli Date of Service: 05/27/2021 [facility], [place]                                                        End Of Treatment Note  Diagnoses: C50.411-Malignant neoplasm of upper-outer quadrant of right female breast  Cancer Staging: Stage IA, pT1cN0M0 grade 1, ER/PR positive invasive ductal carcinoma of the right breast.   Intent: Curative  Radiation Treatment Dates: 04/22/2021 through 05/27/2021 Site Technique Total Dose (Gy) Dose per Fx (Gy) Completed Fx Beam Energies  Breast, Right: Breast_Rt 3D 42.56/42.56 2.66 16/16 10X  Breast, Right: Breast_Rt_Bst 3D 8/8 2 4/4 6X, 10X   Narrative: The patient tolerated radiation therapy relatively well. She developed fatigue and anticipated skin changes in the treatment field. She did have some dry desquamation at the conclusion of therapy in the inframammary fold. No moist desquamation was present.  Plan: The patient will receive a call in about one month from the radiation oncology department. She will continue follow up with Dr. Lindi Adie as well.   ________________________________________________    Carola Rhine, [facility]

## 2021-06-01 ENCOUNTER — Ambulatory Visit: Payer: PPO | Admitting: Podiatry

## 2021-06-04 ENCOUNTER — Other Ambulatory Visit: Payer: Self-pay | Admitting: Student

## 2021-06-04 DIAGNOSIS — Z794 Long term (current) use of insulin: Secondary | ICD-10-CM

## 2021-06-08 ENCOUNTER — Ambulatory Visit: Payer: PPO | Attending: General Surgery

## 2021-06-09 ENCOUNTER — Other Ambulatory Visit: Payer: Self-pay | Admitting: Internal Medicine

## 2021-06-14 ENCOUNTER — Other Ambulatory Visit: Payer: Self-pay | Admitting: Student

## 2021-06-14 ENCOUNTER — Other Ambulatory Visit: Payer: Self-pay | Admitting: Gastroenterology

## 2021-06-14 DIAGNOSIS — R1013 Epigastric pain: Secondary | ICD-10-CM

## 2021-06-14 DIAGNOSIS — E1140 Type 2 diabetes mellitus with diabetic neuropathy, unspecified: Secondary | ICD-10-CM

## 2021-06-14 DIAGNOSIS — K2970 Gastritis, unspecified, without bleeding: Secondary | ICD-10-CM

## 2021-06-17 ENCOUNTER — Encounter: Payer: Self-pay | Admitting: Hematology and Oncology

## 2021-06-17 ENCOUNTER — Other Ambulatory Visit (HOSPITAL_COMMUNITY): Payer: Self-pay

## 2021-06-21 ENCOUNTER — Ambulatory Visit
Admission: RE | Admit: 2021-06-21 | Discharge: 2021-06-21 | Disposition: A | Discharge status: Home / Self Care | Payer: PPO | Source: Ambulatory Visit | Attending: Radiation Oncology | Admitting: Radiation Oncology

## 2021-06-21 DIAGNOSIS — C50411 Malignant neoplasm of upper-outer quadrant of right female breast: Secondary | ICD-10-CM

## 2021-06-21 DIAGNOSIS — Z170 Estrogen receptor positive status [ER+]: Secondary | ICD-10-CM

## 2021-06-21 NOTE — Progress Notes (Signed)
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   04/22/2021 through 05/27/2021 Site Technique Total Dose (Gy) Dose per Fx (Gy) Completed Fx Beam Energies  Breast, Right: Breast_Rt 3D 42.56/42.56 2.66 16/16 10X  Breast, Right: Breast_Rt_Bst 3D 8/8 2 4/4 6X, 10X   Narrative: The patient tolerated radiation therapy relatively well. She developed fatigue and anticipated skin changes in the treatment field. She did have some dry desquamation at the conclusion of therapy in the inframammary fold. No moist desquamation was present.   Impression/Plan: 1. Stage IA, pT1cN0M0 grade 1, ER/PR positive invasive ductal carcinoma of the right breast. I was unable to reach the patient but left a voicemail and on the message, I  discussed that we would be happy to continue to follow her as needed, but she will also continue to follow up with Dr. Lindi Adie in medical oncology. She was counseled to call if she had questions or cocnerns.     Carola Rhine, PAC

## 2021-06-22 DIAGNOSIS — M545 Low back pain, unspecified: Secondary | ICD-10-CM | POA: Diagnosis not present

## 2021-06-22 DIAGNOSIS — E119 Type 2 diabetes mellitus without complications: Secondary | ICD-10-CM | POA: Diagnosis not present

## 2021-06-22 DIAGNOSIS — Z79899 Other long term (current) drug therapy: Secondary | ICD-10-CM | POA: Diagnosis not present

## 2021-06-22 DIAGNOSIS — M546 Pain in thoracic spine: Secondary | ICD-10-CM | POA: Diagnosis not present

## 2021-06-22 DIAGNOSIS — Z6841 Body Mass Index (BMI) 40.0 and over, adult: Secondary | ICD-10-CM | POA: Diagnosis not present

## 2021-06-22 DIAGNOSIS — M5136 Other intervertebral disc degeneration, lumbar region: Secondary | ICD-10-CM | POA: Diagnosis not present

## 2021-06-22 DIAGNOSIS — M542 Cervicalgia: Secondary | ICD-10-CM | POA: Diagnosis not present

## 2021-06-25 ENCOUNTER — Ambulatory Visit: Payer: PPO | Admitting: Internal Medicine

## 2021-06-28 DIAGNOSIS — E1140 Type 2 diabetes mellitus with diabetic neuropathy, unspecified: Secondary | ICD-10-CM | POA: Diagnosis not present

## 2021-07-03 ENCOUNTER — Other Ambulatory Visit: Payer: Self-pay | Admitting: Internal Medicine

## 2021-07-03 DIAGNOSIS — I10 Essential (primary) hypertension: Secondary | ICD-10-CM

## 2021-07-23 DIAGNOSIS — E119 Type 2 diabetes mellitus without complications: Secondary | ICD-10-CM | POA: Diagnosis not present

## 2021-07-23 DIAGNOSIS — Z6841 Body Mass Index (BMI) 40.0 and over, adult: Secondary | ICD-10-CM | POA: Diagnosis not present

## 2021-07-23 DIAGNOSIS — M542 Cervicalgia: Secondary | ICD-10-CM | POA: Diagnosis not present

## 2021-07-23 DIAGNOSIS — Z79899 Other long term (current) drug therapy: Secondary | ICD-10-CM | POA: Diagnosis not present

## 2021-07-23 DIAGNOSIS — M5136 Other intervertebral disc degeneration, lumbar region: Secondary | ICD-10-CM | POA: Diagnosis not present

## 2021-07-23 DIAGNOSIS — M546 Pain in thoracic spine: Secondary | ICD-10-CM | POA: Diagnosis not present

## 2021-07-23 DIAGNOSIS — M545 Low back pain, unspecified: Secondary | ICD-10-CM | POA: Diagnosis not present

## 2021-07-29 ENCOUNTER — Other Ambulatory Visit: Payer: Self-pay | Admitting: Internal Medicine

## 2021-08-10 ENCOUNTER — Ambulatory Visit: Payer: PPO | Admitting: Emergency Medicine

## 2021-08-11 ENCOUNTER — Other Ambulatory Visit: Payer: Self-pay | Admitting: Internal Medicine

## 2021-08-16 ENCOUNTER — Encounter: Payer: Self-pay | Admitting: Emergency Medicine

## 2021-08-16 ENCOUNTER — Ambulatory Visit (INDEPENDENT_AMBULATORY_CARE_PROVIDER_SITE_OTHER): Payer: PPO | Admitting: Emergency Medicine

## 2021-08-16 ENCOUNTER — Other Ambulatory Visit: Payer: Self-pay

## 2021-08-16 VITALS — BP 132/70 | HR 89 | Temp 98.8°F | Ht 64.0 in | Wt 249.0 lb

## 2021-08-16 DIAGNOSIS — E1165 Type 2 diabetes mellitus with hyperglycemia: Secondary | ICD-10-CM | POA: Diagnosis not present

## 2021-08-16 DIAGNOSIS — M545 Low back pain, unspecified: Secondary | ICD-10-CM | POA: Diagnosis not present

## 2021-08-16 DIAGNOSIS — E1169 Type 2 diabetes mellitus with other specified complication: Secondary | ICD-10-CM

## 2021-08-16 DIAGNOSIS — E785 Hyperlipidemia, unspecified: Secondary | ICD-10-CM

## 2021-08-16 DIAGNOSIS — E1159 Type 2 diabetes mellitus with other circulatory complications: Secondary | ICD-10-CM | POA: Diagnosis not present

## 2021-08-16 DIAGNOSIS — G8929 Other chronic pain: Secondary | ICD-10-CM | POA: Diagnosis not present

## 2021-08-16 DIAGNOSIS — J449 Chronic obstructive pulmonary disease, unspecified: Secondary | ICD-10-CM | POA: Diagnosis not present

## 2021-08-16 DIAGNOSIS — Z794 Long term (current) use of insulin: Secondary | ICD-10-CM

## 2021-08-16 DIAGNOSIS — G894 Chronic pain syndrome: Secondary | ICD-10-CM | POA: Diagnosis not present

## 2021-08-16 DIAGNOSIS — I152 Hypertension secondary to endocrine disorders: Secondary | ICD-10-CM | POA: Diagnosis not present

## 2021-08-16 DIAGNOSIS — Z79891 Long term (current) use of opiate analgesic: Secondary | ICD-10-CM

## 2021-08-16 LAB — POCT GLYCOSYLATED HEMOGLOBIN (HGB A1C): Hemoglobin A1C: 6.7 % — AB (ref 4.0–5.6)

## 2021-08-16 NOTE — Assessment & Plan Note (Signed)
Well-controlled hypertension.  Continue irbesartan and hydrochlorothiazide. ?BP Readings from Last 3 Encounters:  ?05/24/21 140/64  ?05/05/21 120/74  ?04/16/21 130/88  ?Well-controlled diabetes with hemoglobin A1c of 6.7. ?Continue Ozempic 2 mg weekly, metformin 1000 mg twice a day, Farxiga 10 mg daily and insulin as instructed by endocrinologist. ?Diet and nutrition discussed. ?Follow-up in 6 months. ? ?

## 2021-08-16 NOTE — Progress Notes (Addendum)
[name] 61 y.o.   Chief Complaint  Patient presents with   Diabetes    F/u    HISTORY OF PRESENT ILLNESS: This is a 61 y.o. female here for hypertension and diabetes follow-up. Patient also has history of COPD. Also has history of chronic low back pain on chronic opiate medications. Lab Results  Component Value Date   HGBA1C 8.2 (A) 05/05/2021   BP Readings from Last 3 Encounters:  05/24/21 140/64  05/05/21 120/74  04/16/21 130/88   Wt Readings from Last 3 Encounters:  05/24/21 243 lb 12.8 oz (110.6 kg)  05/05/21 244 lb (110.7 kg)  04/16/21 240 lb (108.9 kg)     Diabetes Pertinent negatives for hypoglycemia include no dizziness or headaches. Pertinent negatives for diabetes include no chest pain.    Prior to Admission medications   Medication Sig Start Date End Date Taking? Authorizing Provider  acyclovir (ZOVIRAX) 400 MG tablet TAKE 1 TABLET BY MOUTH THREE TIMES DAILY AS NEEDED (FOR  FLARES) Patient taking differently: Take 400 mg by mouth 2 (two) times daily as needed (flares). 10/27/20   Verdene Lennert, MD  albuterol (VENTOLIN HFA) 108 (90 Base) MCG/ACT inhaler Inhale 2 puffs into the lungs every 6 (six) hours as needed for wheezing or shortness of breath. 05/28/20   Waymon Budge, MD  BELBUCA 600 MCG FILM Take 1 strip by mouth 2 (two) times daily. 03/01/21   [provider]  Blood Glucose Monitoring Suppl (ONETOUCH VERIO FLEX SYSTEM) w/Device KIT 1 each by Does not apply route 3 (three) times daily. 01/12/17   John Giovanni, MD  budesonide-formoterol [facility]) 80-4.5 MCG/ACT inhaler Inhale 2 puffs then rinse mouth, twice daily- maintenance 05/28/20   Jetty Duhamel D, MD  chlorpheniramine (CHLOR-TRIMETON) 4 MG tablet Take 8 mg by mouth in the morning and at bedtime.    [provider]  Continuous Blood Gluc Sensor (FREESTYLE LIBRE 2 SENSOR) MISC USE TO CHECK BLOOD SUGARS FOUR TIMES DAILY. 12/31/20   Shamleffer, Konrad Dolores, MD   Continuous Glucose Monitor DEVI Use as directed. 03/28/17   John Giovanni, MD  diclofenac Sodium (VOLTAREN) 1 % GEL Apply 2 g topically 4 (four) times daily. Rub into affected area of foot 2 to 4 times daily Patient taking differently: Apply 2 g topically 4 (four) times daily as needed (foot pain). 10/10/19   Vivi Barrack, DPM  dicyclomine (BENTYL) 10 MG capsule TAKE 1 CAPSULE BY MOUTH THREE TIMES DAILY BEFORE MEAL(S) 06/15/21   Meryl Dare, MD  esomeprazole (NEXIUM) 40 MG capsule Take 1 capsule (40 mg total) by mouth 2 (two) times daily before a meal. 12/18/19   Lemmon, Violet Baldy, PA  ezetimibe (ZETIA) 10 MG tablet Take 1 tablet (10 mg total) by mouth daily. 09/22/20 09/22/21  Eliezer Bottom, MD  FARXIGA 10 MG TABS tablet Take 1 tablet by mouth once daily 08/12/21   Shamleffer, Konrad Dolores, MD  gabapentin (NEURONTIN) 600 MG tablet Take 600 mg by mouth 2 (two) times daily. 02/26/21   [provider]  hydrochlorothiazide (HYDRODIURIL) 25 MG tablet Take 1 tablet by mouth once daily 04/13/20   Verdene Lennert, MD  HYDROcodone-acetaminophen (NORCO/VICODIN) 5-325 MG tablet Take 1 tablet by mouth every 6 (six) hours as needed for moderate pain or severe pain. 03/12/21   Chevis Pretty III, MD  insulin aspart (NOVOLOG) 100 UNIT/ML injection Max daily 120 units daily 05/05/21   Shamleffer, Konrad Dolores, MD  Insulin Disposable Pump (OMNIPOD DASH INTRO, GEN 4,) KIT  1 Device by Does not apply route every 3 (three) days. 02/19/21   Shamleffer, Konrad Dolores, MD  Insulin Syringe-Needle U-100 (INSULIN SYRINGE 1CC/30GX1/2") 30G X 1/2" 1 ML MISC Use to fill Vgo daily 11/06/18   Dorrell, Cathleen Corti, MD  irbesartan (AVAPRO) 300 MG tablet Take 1 tablet (300 mg total) by mouth daily. 07/11/20 03/12/21  Eliezer Bottom, MD  letrozole (FEMARA) 2.5 MG tablet Take 1 tablet (2.5 mg total) by mouth daily. 06/13/21   Serena Croissant, MD  metFORMIN (GLUCOPHAGE) 1000 MG tablet TAKE 1 TABLET BY MOUTH TWICE DAILY WITH A  MEAL 07/30/21   Shamleffer, Konrad Dolores, MD  montelukast (SINGULAIR) 10 MG tablet Take 1 tablet (10 mg total) by mouth at bedtime. 10/31/18   Nyoka Cowden, MD  Multiple Vitamin (MULTIVITAMIN WITH MINERALS) TABS tablet Take 1 tablet by mouth daily.    [provider]  NARCAN 4 MG/0.1ML LIQD nasal spray kit Place 0.4 mg into the nose once. 12/06/18   [provider]  Dola Argyle LANCETS FINE MISC Check blood sugar 3 times a day 06/22/16   John Giovanni, MD  [facility] B VERIO test strip  CHECK BLOOD SUGAR 3 TIMES A DAY 05/09/18   Dorrell, Cathleen Corti, MD  oxyCODONE-acetaminophen (PERCOCET) 10-325 MG tablet Take 1 tablet by mouth 4 (four) times daily as needed for pain. 03/02/20   [provider]  pravastatin (PRAVACHOL) 40 MG tablet Take 40 mg by mouth daily.    [provider]  rosuvastatin (CRESTOR) 10 MG tablet Take 1 tablet (10 mg total) by mouth daily. 11/27/20   Shamleffer, Konrad Dolores, MD  Semaglutide, 2 MG/DOSE, (OZEMPIC, 2 MG/DOSE,) 8 MG/3ML SOPN Inject 2 mg into the skin once a week. 05/03/21   Shamleffer, Konrad Dolores, MD  valACYclovir (VALTREX) 500 MG tablet TAKE 1 TABLET BY MOUTH TWICE DAILY FOR 5 DAYS AT  TIME  OF  OUTBREAK 07/10/20   Eliezer Bottom, MD    Allergies  Allergen Reactions   Lisinopril Cough    Patient Active Problem List   Diagnosis Date Noted   Malignant neoplasm of upper-outer quadrant of right breast in female, estrogen receptor positive (HCC) 02/22/2021   Pre-operative respiratory examination 09/04/2020   Body mass index (BMI) 40.0-44.9, adult (HCC) 07/28/2020   Lumbago with sciatica, right side 07/28/2020   Elevated CK 07/11/2020   Dyspnea on exertion 07/11/2020   Fracture of base of fifth metatarsal bone of right foot at metaphyseal-diaphyseal junction with nonunion 05/07/2019   OSA on CPAP 10/31/2018   Stress incontinence in female 03/09/2017   Hepatic steatosis 08/29/2016   Herpes simplex 03/08/2016    Diabetic neuropathy with neurologic complication (HCC) 10/20/2015   Long term current use of opiate analgesic 07/21/2015   Preventative health care 05/21/2015   COPD GOLD II  05/31/2013   Lung nodule < 6cm on CT 05/31/2013   Chronic cough 12/07/2012   Chronic pain syndrome 02/13/2012   Lumbar radiculopathy 11/29/2011   Type 2 diabetes mellitus with diabetic neuropathy (HCC) 08/25/2011   Back pain 11/15/2010   Hypertension associated with diabetes (HCC) 07/07/2009   GERD 01/08/2009   Dyslipidemia associated with type 2 diabetes mellitus (HCC) 12/09/2008   Hyperlipidemia 12/09/2008   Morbid obesity due to excess calories (HCC) 12/09/2008    Past Medical History:  Diagnosis Date   Allergy    Arthritis    back    Asthma    AS CHILD   Chronic back pain    Chronic leg pain  due to back pain   Cocaine abuse (HCC)    in remission   COPD (chronic obstructive pulmonary disease) (HCC)    Diabetes (HCC)    with neuropathy   GERD (gastroesophageal reflux disease)    Hyperlipidemia    Hypertension    Intraductal papilloma of left breast 02/18/2016   Neuromuscular disorder (HCC)    neuropathy   Postlaminectomy syndrome of lumbar region 12/07/2011   RECTAL BLEEDING 12/09/2008   Annotation: s/p EGD 7/08 mild gastritis, s/p colonoscopy 7/08- benign polyp  s/p polypectomy and isolated diverticulum.  Qualifier: Diagnosis of  By: Ditzler RN, Debra     Sciatica    per patient    Sleep apnea    CPAP    YEARS AGO DONE 1/2 YEARS AGO AND WAS TOLD DID NOT HAVE   Tobacco abuse    Uterine fibroid    s/p hysterectomy    Past Surgical History:  Procedure Laterality Date   BACK SURGERY  2012   L5-S1 microendoscopic disectomy last surgery 06/2011   BREAST BIOPSY     LEFT    01/19/16   BREAST EXCISIONAL BIOPSY Left 02/2016   BREAST LUMPECTOMY WITH RADIOACTIVE SEED AND SENTINEL LYMPH NODE BIOPSY Right 03/12/2021   Procedure: RIGHT BREAST LUMPECTOMY WITH RADIOACTIVE SEED AND SENTINEL LYMPH NODE  BIOPSY;  Surgeon: Griselda Miner, MD;  Location: [facility] OR;  Service: General;  Laterality: Right;   BREAST REDUCTION SURGERY  1982   CATARACT EXTRACTION Right 11/07/2019   COLONOSCOPY     HAND SURGERY     MIDDLE TRIGGER FINGER RIGHT SIDE   RADIOACTIVE SEED GUIDED EXCISIONAL BREAST BIOPSY Left 02/18/2016   Procedure: LEFT RADIOACTIVE SEED GUIDED EXCISIONAL BREAST BIOPSY;  Surgeon: Ovidio Kin, MD;  Location: [facility] OR;  Service: General;  Laterality: Left;   REDUCTION MAMMAPLASTY Bilateral    TONSILLECTOMY  2008   TOTAL ABDOMINAL HYSTERECTOMY  05/24/2007   hysterectomy   TUBAL LIGATION     UPPER GASTROINTESTINAL ENDOSCOPY      Social History   Socioeconomic History   Marital status: Married    Spouse name: Not on file   Number of children: 3   Years of education: Not on file   Highest education level: Not on file  Occupational History   Occupation: Pare Professional/Admin Asst.    Employer: [employer]  Tobacco Use   Smoking status: Former    Packs/day: 0.50    Years: 20.00    Pack years: 10.00    Types: Cigarettes    Quit date: 07/23/1996    Years since quitting: 25.0   Smokeless tobacco: Never  Vaping Use   Vaping Use: Never used  Substance and Sexual Activity   Alcohol use: No    Alcohol/week: 0.0 standard drinks    Comment: recovering addict clean for 9 years   Drug use: No    Types: Cocaine, Marijuana    Comment: recovering addict clean for 9 years   Sexual activity: Yes    Birth control/protection: Surgical  Other Topics Concern   Not on file  Social History Narrative   Current Social History 01/17/2020        Patient lives with spouse in a home which is 1 story. There are not steps up to the entrance the patient uses.       Patient's method of transportation is personal car.      The highest level of education was some college.      The patient currently works  part-time.      Identified important Relationships are God, husband, kids, grandkids       Pets  : None       Interests / Fun: Drawing and nature       Current Stressors: Pain, Covid       Religious / Personal Beliefs: Christian       Other: None    Social Determinants of Corporate investment banker Strain: Low Risk    Difficulty of Paying Living Expenses: Not hard at all  Food Insecurity: No Food Insecurity   Worried About Programme researcher, broadcasting/film/video in the Last Year: Never true   Barista in the Last Year: Never true  Transportation Needs: No Transportation Needs   Lack of Transportation (Medical): No   Lack of Transportation (Non-Medical): No  Physical Activity: Insufficiently Active   Days of Exercise per Week: 3 days   Minutes of Exercise per Session: 10 min  Stress: No Stress Concern Present   Feeling of Stress : Not at all  Social Connections: Moderately Integrated   Frequency of Communication with Friends and Family: More than three times a week   Frequency of Social Gatherings with Friends and Family: Once a week   Attends Religious Services: More than 4 times per year   Active Member of [employer] or Organizations: No   Attends Engineer, structural: Never   Marital Status: Married  Catering manager Violence: Not At Risk   Fear of Current or Ex-Partner: No   Emotionally Abused: No   Physically Abused: No   Sexually Abused: No    Family History  Problem Relation Age of Onset   Diabetes Father    Heart disease Father    Hypertension Father    Diabetes Mother    Cancer Mother        brain   Hypertension Mother    Kidney disease Sister    Diabetes Sister    Kidney cancer Sister    Stroke Sister    Diabetes Sister    Hypertension Sister    Diabetes Sister    Hypertension Sister    Obesity Son    Colon cancer Neg Hx    Colon polyps Neg Hx    Esophageal cancer Neg Hx    Rectal cancer Neg Hx    Stomach cancer Neg Hx      Review of Systems  Constitutional: Negative.  Negative for chills and fever.  HENT: Negative.  Negative for congestion and  sore throat.   Respiratory: Negative.  Negative for cough and shortness of breath.   Cardiovascular: Negative.  Negative for chest pain and palpitations.  Gastrointestinal: Negative.  Negative for abdominal pain, diarrhea, nausea and vomiting.  Genitourinary: Negative.  Negative for dysuria and hematuria.  Musculoskeletal: Negative.   Skin: Negative.  Negative for rash.  Neurological: Negative.  Negative for dizziness and headaches.  All other systems reviewed and are negative. Today's Vitals   08/16/21 1329  BP: 132/70  Pulse: 89  Temp: 98.8 F (37.1 C)  TempSrc: Oral  SpO2: 94%  Weight: 249 lb (112.9 kg)  Height: 5\' 4"  (1.626 m)   Body mass index is 42.74 kg/m. Wt Readings from Last 3 Encounters:  08/16/21 249 lb (112.9 kg)  05/24/21 243 lb 12.8 oz (110.6 kg)  05/05/21 244 lb (110.7 kg)     Physical Exam Vitals reviewed.  Constitutional:      Appearance: Normal appearance. She is obese.  HENT:  Head: Normocephalic.  Eyes:     Extraocular Movements: Extraocular movements intact.     Pupils: Pupils are equal, round, and reactive to light.  Cardiovascular:     Rate and Rhythm: Normal rate and regular rhythm.     Pulses: Normal pulses.     Heart sounds: Normal heart sounds.  Pulmonary:     Effort: Pulmonary effort is normal.     Breath sounds: Normal breath sounds.  Musculoskeletal:        General: Normal range of motion.     Cervical back: No tenderness.  Lymphadenopathy:     Cervical: No cervical adenopathy.  Skin:    General: Skin is warm and dry.     Capillary Refill: Capillary refill takes less than 2 seconds.  Neurological:     General: No focal deficit present.     Mental Status: She is alert and oriented to person, place, and time.  Psychiatric:        Mood and Affect: Mood normal.        Behavior: Behavior normal.   Results for orders placed or performed in visit on 08/16/21 (from the past 24 hour(s))  POCT glycosylated hemoglobin (Hb A1C)      Status: Abnormal   Collection Time: 08/16/21  1:17 PM  Result Value Ref Range   Hemoglobin A1C 6.7 (A) 4.0 - 5.6 %   HbA1c POC (<> result, manual entry)     HbA1c, POC (prediabetic range)     HbA1c, POC (controlled diabetic range)     *Note: Due to a large number of results and/or encounters for the requested time period, some results have not been displayed. A complete set of results can be found in Results Review.     ASSESSMENT & PLAN: Problem List Items Addressed This Visit       Cardiovascular and Mediastinum   Hypertension associated with diabetes (HCC)    Well-controlled hypertension.  Continue irbesartan and hydrochlorothiazide. BP Readings from Last 3 Encounters:  05/24/21 140/64  05/05/21 120/74  04/16/21 130/88  Well-controlled diabetes with hemoglobin A1c of 6.7. Continue Ozempic 2 mg weekly, metformin 1000 mg twice a day, Farxiga 10 mg daily and insulin as instructed by endocrinologist. Diet and nutrition discussed. Follow-up in 6 months.         Respiratory   COPD GOLD II      Endocrine   Dyslipidemia associated with type 2 diabetes mellitus (HCC)    Stable.  Diet and nutrition discussed.  Continue rosuvastatin 10 mg daily. The 10-year ASCVD risk score (Arnett DK, et al., 2019) is: 18.1%   Values used to calculate the score:     Age: 47 years     Sex: Female     Is Non-Hispanic African American: Yes     Diabetic: Yes     Tobacco smoker: No     Systolic Blood Pressure: 140 mmHg     Is BP treated: Yes     HDL Cholesterol: 34.5 mg/dL     Total Cholesterol: 131 mg/dL         Other   Back pain (Chronic)   Long term current use of opiate analgesic (Chronic)   Chronic pain syndrome   Other Visit Diagnoses     Type 2 diabetes mellitus with hyperglycemia, with long-term current use of insulin (HCC)    -  Primary   Relevant Orders   POCT glycosylated hemoglobin (Hb A1C) (Completed)         Edwina Barth,  MD [facility]

## 2021-08-16 NOTE — Patient Instructions (Signed)

## 2021-08-16 NOTE — Assessment & Plan Note (Signed)
Stable.  Diet and nutrition discussed.  Continue rosuvastatin 10 mg daily. ?The 10-year ASCVD risk score (Arnett DK, et al., 2019) is: 18.1% ?  Values used to calculate the score: ?    Age: 61 years ?    Sex: Female ?    Is Non-Hispanic African American: Yes ?    Diabetic: Yes ?    Tobacco smoker: No ?    Systolic Blood Pressure: 381 mmHg ?    Is BP treated: Yes ?    HDL Cholesterol: 34.5 mg/dL ?    Total Cholesterol: 131 mg/dL ? ?

## 2021-08-20 DIAGNOSIS — Z79899 Other long term (current) drug therapy: Secondary | ICD-10-CM | POA: Diagnosis not present

## 2021-08-20 DIAGNOSIS — M545 Low back pain, unspecified: Secondary | ICD-10-CM | POA: Diagnosis not present

## 2021-08-20 DIAGNOSIS — M542 Cervicalgia: Secondary | ICD-10-CM | POA: Diagnosis not present

## 2021-08-20 DIAGNOSIS — Z6841 Body Mass Index (BMI) 40.0 and over, adult: Secondary | ICD-10-CM | POA: Diagnosis not present

## 2021-08-20 DIAGNOSIS — E119 Type 2 diabetes mellitus without complications: Secondary | ICD-10-CM | POA: Diagnosis not present

## 2021-08-20 DIAGNOSIS — M546 Pain in thoracic spine: Secondary | ICD-10-CM | POA: Diagnosis not present

## 2021-08-20 DIAGNOSIS — M5136 Other intervertebral disc degeneration, lumbar region: Secondary | ICD-10-CM | POA: Diagnosis not present

## 2021-08-21 ENCOUNTER — Other Ambulatory Visit: Payer: Self-pay | Admitting: Internal Medicine

## 2021-08-21 DIAGNOSIS — I10 Essential (primary) hypertension: Secondary | ICD-10-CM

## 2021-08-24 ENCOUNTER — Inpatient Hospital Stay: Payer: PPO | Attending: Adult Health | Admitting: Adult Health

## 2021-08-24 ENCOUNTER — Encounter: Payer: Self-pay | Admitting: Adult Health

## 2021-08-24 ENCOUNTER — Encounter: Payer: Self-pay | Admitting: Hematology and Oncology

## 2021-08-24 ENCOUNTER — Other Ambulatory Visit: Payer: Self-pay

## 2021-08-24 ENCOUNTER — Other Ambulatory Visit (HOSPITAL_COMMUNITY): Payer: Self-pay

## 2021-08-24 VITALS — BP 164/72 | HR 102 | Temp 97.0°F | Resp 18 | Ht 64.0 in | Wt 253.4 lb

## 2021-08-24 DIAGNOSIS — Z794 Long term (current) use of insulin: Secondary | ICD-10-CM | POA: Diagnosis not present

## 2021-08-24 DIAGNOSIS — Z79899 Other long term (current) drug therapy: Secondary | ICD-10-CM | POA: Diagnosis not present

## 2021-08-24 DIAGNOSIS — B370 Candidal stomatitis: Secondary | ICD-10-CM | POA: Diagnosis not present

## 2021-08-24 DIAGNOSIS — Z170 Estrogen receptor positive status [ER+]: Secondary | ICD-10-CM | POA: Diagnosis not present

## 2021-08-24 DIAGNOSIS — Z7984 Long term (current) use of oral hypoglycemic drugs: Secondary | ICD-10-CM | POA: Insufficient documentation

## 2021-08-24 DIAGNOSIS — E2839 Other primary ovarian failure: Secondary | ICD-10-CM | POA: Diagnosis not present

## 2021-08-24 DIAGNOSIS — I10 Essential (primary) hypertension: Secondary | ICD-10-CM | POA: Diagnosis not present

## 2021-08-24 DIAGNOSIS — C50411 Malignant neoplasm of upper-outer quadrant of right female breast: Secondary | ICD-10-CM | POA: Insufficient documentation

## 2021-08-24 DIAGNOSIS — Z79811 Long term (current) use of aromatase inhibitors: Secondary | ICD-10-CM | POA: Insufficient documentation

## 2021-08-24 DIAGNOSIS — Z923 Personal history of irradiation: Secondary | ICD-10-CM | POA: Diagnosis not present

## 2021-08-24 DIAGNOSIS — Z7951 Long term (current) use of inhaled steroids: Secondary | ICD-10-CM | POA: Insufficient documentation

## 2021-08-24 DIAGNOSIS — Z9221 Personal history of antineoplastic chemotherapy: Secondary | ICD-10-CM | POA: Diagnosis not present

## 2021-08-24 MED ORDER — FLUCONAZOLE 150 MG PO TABS
150.0000 mg | ORAL_TABLET | Freq: Every day | ORAL | 0 refills | Status: DC
  Filled 2021-08-24: qty 4, 4d supply, fill #0

## 2021-08-24 MED ORDER — HYDROCHLOROTHIAZIDE 25 MG PO TABS
25.0000 mg | ORAL_TABLET | Freq: Every day | ORAL | 1 refills | Status: DC
  Filled 2021-08-24: qty 90, 90d supply, fill #0
  Filled 2021-11-26: qty 90, 90d supply, fill #1

## 2021-08-24 MED ORDER — ZOSTER VAC RECOMB ADJUVANTED 50 MCG/0.5ML IM SUSR
INTRAMUSCULAR | 1 refills | Status: DC
  Filled 2021-08-24: qty 0.5, 1d supply, fill #0

## 2021-08-24 NOTE — Progress Notes (Signed)
SURVIVORSHIP VISIT: ? ? ?BRIEF ONCOLOGIC HISTORY:  ?Oncology History  ?Malignant neoplasm of upper-outer quadrant of right breast in female, estrogen receptor positive ([place])  ?02/16/2021 Initial Diagnosis  ? Screening mammogram: possible asymmetry in the right breast. Diagnostic mammogram and [place]: 0.7 cm suspicious mass in the right breast at 10:00. 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%), HER2 negative by FISH, Ki-67 5% ?  ?02/24/2021 Cancer Staging  ? Staging form: Breast, AJCC 8th Edition ?- Clinical stage from 02/24/2021: Stage IA (cT1b, cN0, cM0, G1, ER+, PR+, HER2-) - Signed by Nicholas Lose, MD on 02/24/2021 ?Stage prefix: Initial diagnosis ?Histologic grading system: 3 grade system ? ?  ?03/12/2021 Surgery  ? Right lumpectomy: Grade 1 IDC 1.2 cm, low-grade DCIS, margins negative, 0/5 lymph nodes negative ER greater than 95%, PR 40%, HER2 negative, Ki-67 5% ?  ?03/12/2021 Cancer Staging  ? Staging form: Breast, AJCC 8th Edition ?- Pathologic stage from 03/12/2021: Stage IA (pT1c, pN0, cM0, G2, ER+, PR+, HER2-) - Signed by Gardenia Phlegm, NP on 08/23/2021 ?Stage prefix: Initial diagnosis ?Histologic grading system: 3 grade system ? ?  ?03/25/2021 Oncotype testing  ? Oncotype DX recurrence score: 27, distant recurrence at 9 years: 16% ?  ?04/13/2021 -  Chemotherapy  ? Patient is on Treatment Plan : BREAST TC q21d  ?   ?04/22/2021 - 05/27/2021 Radiation Therapy  ?  ?Site Technique Total Dose (Gy) Dose per Fx (Gy) Completed Fx Beam Energies  ?Breast, Right: Breast_Rt 3D 42.56/42.56 2.66 16/16 10X  ?Breast, Right: Breast_Rt_Bst 3D 8/8 2 4/4 6X, 10X  ?  ?06/2021 -  Anti-estrogen oral therapy  ? Anti-estrogen therapy with Letrozole daily ?  ? ? ?INTERVAL HISTORY:  ?[name] to review her survivorship care plan detailing her treatment course for breast cancer, as well as monitoring long-term side effects of that treatment, education regarding health maintenance, screening, and overall wellness and health  promotion.    ? ?Overall, [name] reports feeling quite well.  She is taking letrozole daily and is tolerating it well with the exception of some hot flashes.   ? ?REVIEW OF SYSTEMS:  ?Review of Systems  ?Constitutional:  Negative for appetite change, chills, fatigue, fever and unexpected weight change.  ?HENT:   Negative for hearing loss, lump/mass and trouble swallowing.   ?Eyes:  Negative for eye problems and icterus.  ?Respiratory:  Negative for chest tightness, cough and shortness of breath.   ?Cardiovascular:  Negative for chest pain, leg swelling and palpitations.  ?Gastrointestinal:  Negative for abdominal distention, abdominal pain, constipation, diarrhea, nausea and vomiting.  ?Endocrine: Positive for hot flashes.  ?Genitourinary:  Negative for difficulty urinating.   ?Musculoskeletal:  Negative for arthralgias.  ?Skin:  Negative for itching and rash.  ?Neurological:  Negative for dizziness, extremity weakness, headaches and numbness.  ?Hematological:  Negative for adenopathy. Does not bruise/bleed easily.  ?Psychiatric/Behavioral:  Negative for depression. The patient is not nervous/anxious.   ?Breast: Denies any new nodularity, masses, tenderness, nipple changes, or nipple discharge.  ? ? ? ? ?ONCOLOGY TREATMENT TEAM:  ?1. Surgeon:  Dr. Marlou Starks at [facility] ?2. Medical Oncologist: Dr. Lindi Adie  ?3. Radiation Oncologist: Dr. Lisbeth Renshaw ?  ? ?PAST MEDICAL/SURGICAL HISTORY:  ?Past Medical History:  ?Diagnosis Date  ? Allergy   ? Arthritis   ? back   ? Asthma   ? AS CHILD  ? Chronic back pain   ? Chronic leg pain   ? due to back pain  ? Cocaine abuse (  [place])   ? in remission  ? COPD (chronic obstructive pulmonary disease) ([place])   ? Diabetes ([place])   ? with neuropathy  ? GERD (gastroesophageal reflux disease)   ? Hyperlipidemia   ? Hypertension   ? Intraductal papilloma of left breast 02/18/2016  ? Neuromuscular disorder ([place])   ? neuropathy  ? Postlaminectomy syndrome of lumbar region 12/07/2011  ? RECTAL  BLEEDING 12/09/2008  ? Annotation: s/p EGD 7/08 mild gastritis, s/p colonoscopy 7/08- benign polyp  s/p polypectomy and isolated diverticulum.  Qualifier: Diagnosis of  By: Ditzler RN, Debra    ? Sciatica   ? per patient   ? Sleep apnea   ? CPAP    YEARS AGO DONE 1/2 YEARS AGO AND WAS TOLD DID NOT HAVE  ? Tobacco abuse   ? Uterine fibroid   ? s/p hysterectomy  ? ?Past Surgical History:  ?Procedure Laterality Date  ? BACK SURGERY  2012  ? L5-S1 microendoscopic disectomy last surgery 06/2011  ? BREAST BIOPSY    ? LEFT    01/19/16  ? BREAST EXCISIONAL BIOPSY Left 02/2016  ? BREAST LUMPECTOMY WITH RADIOACTIVE SEED AND SENTINEL LYMPH NODE BIOPSY Right 03/12/2021  ? Procedure: RIGHT BREAST LUMPECTOMY WITH RADIOACTIVE SEED AND SENTINEL LYMPH NODE BIOPSY;  Surgeon: Jovita Kussmaul, MD;  Location: [facility];  Service: General;  Laterality: Right;  ? [place]  ? CATARACT EXTRACTION Right 11/07/2019  ? COLONOSCOPY    ? HAND SURGERY    ? MIDDLE TRIGGER FINGER RIGHT SIDE  ? RADIOACTIVE SEED GUIDED EXCISIONAL BREAST BIOPSY Left 02/18/2016  ? Procedure: LEFT RADIOACTIVE SEED GUIDED EXCISIONAL BREAST BIOPSY;  Surgeon: Alphonsa Overall, MD;  Location: [facility];  Service: General;  Laterality: Left;  ? REDUCTION MAMMAPLASTY Bilateral   ? TONSILLECTOMY  2008  ? TOTAL ABDOMINAL HYSTERECTOMY  05/24/2007  ? hysterectomy  ? TUBAL LIGATION    ? UPPER GASTROINTESTINAL ENDOSCOPY    ? ? ? ?ALLERGIES:  ?Allergies  ?Allergen Reactions  ? Lisinopril Cough  ? ? ? ?CURRENT MEDICATIONS:  ?Outpatient Encounter Medications as of 08/24/2021  ?Medication Sig  ? albuterol (VENTOLIN HFA) 108 (90 Base) MCG/ACT inhaler Inhale 2 puffs into the lungs every 6 (six) hours as needed for wheezing or shortness of breath.  ? BELBUCA 600 MCG FILM Take 100 mcg by mouth 2 (two) times daily.  ? Blood Glucose Monitoring Suppl (ONETOUCH VERIO FLEX SYSTEM) w/Device KIT 1 each by Does not apply route 3 (three) times daily.  ? budesonide-formoterol (SYMBICORT) 80-4.5  MCG/ACT inhaler Inhale 2 puffs then rinse mouth, twice daily- maintenance  ? chlorpheniramine (CHLOR-TRIMETON) 4 MG tablet Take 8 mg by mouth in the morning and at bedtime.  ? Continuous Blood Gluc Sensor (FREESTYLE LIBRE 2 SENSOR) MISC USE TO CHECK BLOOD SUGARS FOUR TIMES DAILY.  ? Continuous Glucose Monitor DEVI Use as directed.  ? diclofenac Sodium (VOLTAREN) 1 % GEL Apply 2 g topically 4 (four) times daily. Rub into affected area of foot 2 to 4 times daily (Patient taking differently: Apply 2 g topically 4 (four) times daily as needed (foot pain).)  ? dicyclomine (BENTYL) 10 MG capsule TAKE 1 CAPSULE BY MOUTH THREE TIMES DAILY BEFORE MEAL(S)  ? ezetimibe (ZETIA) 10 MG tablet Take 1 tablet (10 mg total) by mouth daily.  ? FARXIGA 10 MG TABS tablet Take 1 tablet by mouth once daily  ? gabapentin (NEURONTIN) 600 MG tablet Take 600 mg by mouth 2 (two) times daily.  ? insulin  aspart (NOVOLOG) 100 UNIT/ML injection Max daily 120 units daily  ? Insulin Disposable Pump (OMNIPOD DASH INTRO, GEN 4,) KIT 1 Device by Does not apply route every 3 (three) days.  ? Insulin Syringe-Needle U-100 (INSULIN SYRINGE 1CC/30GX1/2") 30G X 1/2" 1 ML MISC Use to fill Vgo daily  ? letrozole (FEMARA) 2.5 MG tablet Take 1 tablet (2.5 mg total) by mouth daily.  ? metFORMIN (GLUCOPHAGE) 1000 MG tablet TAKE 1 TABLET BY MOUTH TWICE DAILY WITH A MEAL  ? montelukast (SINGULAIR) 10 MG tablet Take 1 tablet (10 mg total) by mouth at bedtime.  ? Multiple Vitamin (MULTIVITAMIN WITH MINERALS) TABS tablet Take 1 tablet by mouth daily.  ? oxyCODONE-acetaminophen (PERCOCET) 10-325 MG tablet Take 1 tablet by mouth 4 (four) times daily as needed for pain.  ? rosuvastatin (CRESTOR) 10 MG tablet Take 1 tablet (10 mg total) by mouth daily.  ? Semaglutide, 2 MG/DOSE, (OZEMPIC, 2 MG/DOSE,) 8 MG/3ML SOPN Inject 2 mg into the skin once a week.  ? acyclovir (ZOVIRAX) 400 MG tablet TAKE 1 TABLET BY MOUTH THREE TIMES DAILY AS NEEDED (FOR  FLARES) (Patient not taking:  Reported on 08/24/2021)  ? hydrochlorothiazide (HYDRODIURIL) 25 MG tablet Take 1 tablet by mouth once daily (Patient not taking: Reported on 08/24/2021)  ? irbesartan (AVAPRO) 300 MG tablet Take 1 tablet (300

## 2021-08-25 ENCOUNTER — Telehealth: Payer: Self-pay | Admitting: Adult Health

## 2021-08-25 ENCOUNTER — Telehealth: Payer: Self-pay | Admitting: Hematology and Oncology

## 2021-08-25 NOTE — Telephone Encounter (Signed)
Made a FU call to the patient about her scheduled upcoming appointment. Left message. ?

## 2021-08-25 NOTE — Telephone Encounter (Signed)
Scheduled appointment per 3/14 los. During call with patient, she asked for the scheduler to call back around 11-12. Scheduler will follow up. ?

## 2021-08-31 ENCOUNTER — Other Ambulatory Visit (HOSPITAL_COMMUNITY): Payer: Self-pay

## 2021-09-02 ENCOUNTER — Ambulatory Visit: Payer: PPO | Admitting: Internal Medicine

## 2021-09-02 ENCOUNTER — Encounter: Payer: Self-pay | Admitting: Internal Medicine

## 2021-09-02 ENCOUNTER — Other Ambulatory Visit: Payer: Self-pay

## 2021-09-02 VITALS — BP 122/80 | HR 62 | Ht 64.0 in | Wt 243.0 lb

## 2021-09-02 DIAGNOSIS — E785 Hyperlipidemia, unspecified: Secondary | ICD-10-CM

## 2021-09-02 DIAGNOSIS — E1140 Type 2 diabetes mellitus with diabetic neuropathy, unspecified: Secondary | ICD-10-CM | POA: Diagnosis not present

## 2021-09-02 DIAGNOSIS — Z794 Long term (current) use of insulin: Secondary | ICD-10-CM

## 2021-09-02 MED ORDER — FREESTYLE TEST VI STRP: 1.0000 | ORAL_STRIP | Freq: Three times a day (TID) | 3 refills | Status: DC

## 2021-09-02 NOTE — Patient Instructions (Signed)
-   Keep Up the Good Work ! ?- Continue Metformin 1 tablet Twice daily  ?- Continue  Ozempic 2  mg weekly  ?- Continue  Farxiga 10 mg , 1 tablet with breakfast  ?- Continue insulin pump  ? ? ? ? ?HOW TO TREAT LOW BLOOD SUGARS (Blood sugar LESS THAN 70 MG/DL) ?Please follow the RULE OF 15 for the treatment of hypoglycemia treatment (when your (blood sugars are less than 70 mg/dL)  ? ?STEP 1: Take 15 grams of carbohydrates when your blood sugar is low, which includes:  ?3-4 GLUCOSE TABS  OR ?3-4 OZ OF JUICE OR REGULAR SODA OR ?ONE TUBE OF GLUCOSE GEL   ? ?STEP 2: RECHECK blood sugar in 15 MINUTES ?STEP 3: If your blood sugar is still low at the 15 minute recheck --> then, go back to STEP 1 and treat AGAIN with another 15 grams of carbohydrates. ? ?

## 2021-09-02 NOTE — Progress Notes (Signed)
? ?Name: [name]  ?Age/ Sex: 61 y.o., female   ?MRN/ DOB: [id number], 1961-01-16    ? ?PCP: Horald Pollen, MD   ?Reason for Endocrinology Evaluation: Type 2 Diabetes Mellitus  ?Initial Endocrine Consultative Visit: 03/18/2019  ? ? ?PATIENT IDENTIFIER: [name] is a 61 y.o. female with a past medical history of T2DM, HTN, OSA and dyslipidemia , breast CA(Dx 02/2021) status post right lumpectomy chemo and radiation. The patient has followed with Endocrinology clinic since 03/18/2019 for consultative assistance with management of her diabetes. ? ?DIABETIC HISTORY:  ?[name] was diagnosed with T2DM many years ago. Has been on Soliqua, Glipizide and V-Go was started in 2019.  Her hemoglobin A1c has ranged from 8.1% in 2016, peaking at 9.8% in 2020. ? ?On her initial visit to our clinic, she was on V-Go 40 with Humulin U-500 , Metformin, and Ozempic with an A1c 9.7% . We stopped the V-Go  And the U-500 due to recurrent hypoglycemia . We started Novolog Mix and continued metformin and Ozempic. ? ? ?SGLT-2 inhibitor started through PCP 03/2020 ? ?SUBJECTIVE:  ? ?During the last visit (05/05/2021): A1c 8.2% .  We continued Farxiga,Ozempic, metformin, and NovoLog per OmniPod ? ? ?Today (09/02/2021): [name] is here for a follow up on her diabetes care.  She checks her blood sugars multiple times daily, through freestyle libre. The patient has not  had hypoglycemic episodes since the last clinic visit.  ? ?She continues to follow-up with oncology for Breast Ca that was diagnosed 02/2021, S/P lumpectomy , chemo and radiation ? ?Denies nausea, vomiting or diarrhea  ? ? ?This patient with type 2 diabetes is treated with Omnipod  (insulin pump). During the visit the pump basal and bolus doses were reviewed including carb/insulin rations and supplemental doses. The clinical list was updated. The glucose meter download was reviewed in detail to determine if the current pump settings are providing the best glycemic  control without excessive hypoglycemia. ? ?Pump and meter download: ? ? ? ?Pump   Omnipod Settings   ?Insulin type   Novolog    ?Basal rate      ? 0000 3u/h   ?    ?I:C ratio      ? 0000 1:1  ?    ?    ?    ?    ?Sensitivity      ? 0000  20  ?    ?Goal      ? 0000  120  ?  ?  ? ? ? ? ? ? ?Type & Model of Pump: Omnipod ?Insulin Type: Currently using Novolog . ? ?Body mass index is 41.71 kg/m?. ? ?PUMP STATISTICS: ?Average BG: 153 ?BG Readings: 1.9 / day ?Average Daily Carbs (g): 24.1 ?Average Total Daily Insulin: 87.3 ?Average Daily Basal: 60.6(69 %) ?Average Daily Bolus: 26.7 (351%) ? ?She enters 10 units with a full breakfast and 4 units if eats a banana /coffee ?Enters 14 for lunch and supper  ? ?HOME DIABETES REGIMEN:  ?Metformin 1000 tablet Twice daily  ?Ozempic 2 mg weekly ( Fridays) ?Farxiga 10 mg daily  ?Novolog  ? ? ? ?CONTINUOUS GLUCOSE MONITORING RECORD INTERPRETATION   ? ?Dates of Recording: 3/9-3/22/2023 ?Sensor description: Freestyle Libre ? ?Results statistics: ?  ?CGM use % of time 73  ?Average and SD 144/24.9  ?Time in range   82 %  ?% Time Above 180 16  ?% Time above 250 1  ?% Time Below  target 1  ? ? ?Glycemic patterns summary: OPtimal BG's during the night , hyperglycemia noted during the day  ? ?Hypoglycemic episodes occurred during the day  ? ? ?Overnight periods: stable  ? ? ? ? ?DIABETIC COMPLICATIONS: ?Microvascular complications:  ?Neuropathy  ?Denies: CKD, retinopathy  ?Last eye exam: Completed 07/21/2020 ?  ?Macrovascular complications:  ?  ?Denies: CAD, PVD, CVA ?  ? ?HISTORY:  ?Past Medical History:  ?Past Medical History:  ?Diagnosis Date  ? Allergy   ? Arthritis   ? back   ? Asthma   ? AS CHILD  ? Chronic back pain   ? Chronic leg pain   ? due to back pain  ? Cocaine abuse ([place])   ? in remission  ? COPD (chronic obstructive pulmonary disease) ([place])   ? Diabetes ([place])   ? with neuropathy  ? GERD (gastroesophageal reflux disease)   ? Hyperlipidemia   ? Hypertension   ? Intraductal  papilloma of left breast 02/18/2016  ? Neuromuscular disorder ([place])   ? neuropathy  ? Postlaminectomy syndrome of lumbar region 12/07/2011  ? RECTAL BLEEDING 12/09/2008  ? Annotation: s/p EGD 7/08 mild gastritis, s/p colonoscopy 7/08- benign polyp  s/p polypectomy and isolated diverticulum.  Qualifier: Diagnosis of  By: Ditzler RN, Debra    ? Sciatica   ? per patient   ? Sleep apnea   ? CPAP    YEARS AGO DONE 1/2 YEARS AGO AND WAS TOLD DID NOT HAVE  ? Tobacco abuse   ? Uterine fibroid   ? s/p hysterectomy  ? ?Past Surgical History:  ?Past Surgical History:  ?Procedure Laterality Date  ? BACK SURGERY  2012  ? L5-S1 microendoscopic disectomy last surgery 06/2011  ? BREAST BIOPSY    ? LEFT    01/19/16  ? BREAST EXCISIONAL BIOPSY Left 02/2016  ? BREAST LUMPECTOMY WITH RADIOACTIVE SEED AND SENTINEL LYMPH NODE BIOPSY Right 03/12/2021  ? Procedure: RIGHT BREAST LUMPECTOMY WITH RADIOACTIVE SEED AND SENTINEL LYMPH NODE BIOPSY;  Surgeon: Jovita Kussmaul, MD;  Location: [facility];  Service: General;  Laterality: Right;  ? [place]  ? CATARACT EXTRACTION Right 11/07/2019  ? COLONOSCOPY    ? HAND SURGERY    ? MIDDLE TRIGGER FINGER RIGHT SIDE  ? RADIOACTIVE SEED GUIDED EXCISIONAL BREAST BIOPSY Left 02/18/2016  ? Procedure: LEFT RADIOACTIVE SEED GUIDED EXCISIONAL BREAST BIOPSY;  Surgeon: Alphonsa Overall, MD;  Location: [facility];  Service: General;  Laterality: Left;  ? REDUCTION MAMMAPLASTY Bilateral   ? TONSILLECTOMY  2008  ? TOTAL ABDOMINAL HYSTERECTOMY  05/24/2007  ? hysterectomy  ? TUBAL LIGATION    ? UPPER GASTROINTESTINAL ENDOSCOPY    ? ?Social History:  reports that she quit smoking about 25 years ago. Her smoking use included cigarettes. She has a 10.00 pack-year smoking history. She has never used smokeless tobacco. She reports that she does not drink alcohol and does not use drugs. ?Family History:  ?Family History  ?Problem Relation Age of Onset  ? Diabetes Father   ? Heart disease Father   ? Hypertension Father    ? Diabetes Mother   ? Cancer Mother   ?     brain  ? Hypertension Mother   ? Kidney disease Sister   ? Diabetes Sister   ? Kidney cancer Sister   ? Stroke Sister   ? Diabetes Sister   ? Hypertension Sister   ? Diabetes Sister   ? Hypertension Sister   ? Obesity Son   ?  Colon cancer Neg Hx   ? Colon polyps Neg Hx   ? Esophageal cancer Neg Hx   ? Rectal cancer Neg Hx   ? Stomach cancer Neg Hx   ? ? ? ?HOME MEDICATIONS: ?Allergies as of 09/02/2021   ? ?   Reactions  ? Lisinopril Cough  ? ?  ? ?  ?Medication List  ?  ? ?  ? Accurate as of September 02, 2021  9:36 AM. If you have any questions, ask your nurse or doctor.  ?  ?  ? ?  ? ?acyclovir 400 MG tablet ?Commonly known as: ZOVIRAX ?TAKE 1 TABLET BY MOUTH THREE TIMES DAILY AS NEEDED (FOR  FLARES) ?  ?albuterol 108 (90 Base) MCG/ACT inhaler ?Commonly known as: VENTOLIN HFA ?Inhale 2 puffs into the lungs every 6 (six) hours as needed for wheezing or shortness of breath. ?  ?Belbuca 600 MCG Film ?Generic drug: Buprenorphine HCl ?Take 100 mcg by mouth 2 (two) times daily. ?  ?Belbuca 900 MCG Film ?Generic drug: Buprenorphine HCl ?Take 1 strip by mouth 2 (two) times daily. ?  ?budesonide-formoterol 80-4.5 MCG/ACT inhaler ?Commonly known as: Symbicort ?Inhale 2 puffs then rinse mouth, twice daily- maintenance ?  ?chlorpheniramine 4 MG tablet ?Commonly known as: CHLOR-TRIMETON ?Take 8 mg by mouth in the morning and at bedtime. ?  ?Continuous Glucose Monitor Devi ?Use as directed. ?  ?diclofenac Sodium 1 % Gel ?Commonly known as: VOLTAREN ?Apply 2 g topically 4 (four) times daily. Rub into affected area of foot 2 to 4 times daily ?What changed:  ?when to take this ?reasons to take this ?additional instructions ?  ?dicyclomine 10 MG capsule ?Commonly known as: BENTYL ?TAKE 1 CAPSULE BY MOUTH THREE TIMES DAILY BEFORE MEAL(S) ?  ?ezetimibe 10 MG tablet ?Commonly known as: Zetia ?Take 1 tablet (10 mg total) by mouth daily. ?  ?famotidine 20 MG tablet ?Commonly known as: PEPCID ?Take  20 mg by mouth 2 (two) times daily as needed. ?  ?Farxiga 10 MG Tabs tablet ?Generic drug: dapagliflozin propanediol ?Take 1 tablet by mouth once daily ?  ?fluconazole 150 MG tablet ?Commonly known as: Diflu

## 2021-09-03 ENCOUNTER — Other Ambulatory Visit: Payer: Self-pay

## 2021-09-03 ENCOUNTER — Telehealth: Payer: Self-pay

## 2021-09-03 DIAGNOSIS — E1140 Type 2 diabetes mellitus with diabetic neuropathy, unspecified: Secondary | ICD-10-CM

## 2021-09-03 MED ORDER — FREESTYLE LIBRE 2 SENSOR MISC: 0 refills | Status: DC

## 2021-09-03 NOTE — Telephone Encounter (Signed)
Called patient to inform her that her refill request for her Freestyle [name] 2 will need to come from her Endocrinology since that provider sent in her original rx ?

## 2021-09-03 NOTE — Telephone Encounter (Signed)
Pt is requesting a refill: ?Continuous Blood Gluc Sensor (FREESTYLE LIBRE 2 SENSOR) MISC ? ?Pharmacy: ?[facility], [place] ? ?LOV 08/16/21 ?ROV 02/22/22 ?

## 2021-09-06 ENCOUNTER — Other Ambulatory Visit: Payer: Self-pay

## 2021-09-06 ENCOUNTER — Other Ambulatory Visit (HOSPITAL_COMMUNITY): Payer: Self-pay

## 2021-09-06 MED ORDER — FREESTYLE TEST VI STRP: 1.0000 | ORAL_STRIP | Freq: Three times a day (TID) | 3 refills | Status: DC

## 2021-09-06 NOTE — Telephone Encounter (Signed)
One Touch test strips are preferred by ins.  $0 copay.  Freestyle is not covered. ?

## 2021-09-14 ENCOUNTER — Ambulatory Visit
Admission: RE | Admit: 2021-09-14 | Discharge: 2021-09-14 | Disposition: A | Discharge status: Home / Self Care | Payer: PPO | Source: Ambulatory Visit | Attending: Adult Health | Admitting: Adult Health

## 2021-09-14 DIAGNOSIS — E2839 Other primary ovarian failure: Secondary | ICD-10-CM

## 2021-09-14 DIAGNOSIS — Z780 Asymptomatic menopausal state: Secondary | ICD-10-CM | POA: Diagnosis not present

## 2021-09-15 ENCOUNTER — Telehealth: Payer: Self-pay

## 2021-09-15 NOTE — Telephone Encounter (Signed)
-----   Message from Gardenia Phlegm, NP sent at 09/15/2021  1:59 PM EDT ----- ?Normal bone density please notify the patient ?----- Message ----- ?From: Interface, Rad Results In ?Sent: 09/15/2021  10:02 AM EDT ?To: Gardenia Phlegm, NP ? ? ?

## 2021-09-15 NOTE — Telephone Encounter (Signed)
Left VM for pt, bone density normal, advised pt to call back with any additional questions or concerns.   ?

## 2021-09-16 ENCOUNTER — Other Ambulatory Visit: Payer: Self-pay

## 2021-09-16 DIAGNOSIS — E1140 Type 2 diabetes mellitus with diabetic neuropathy, unspecified: Secondary | ICD-10-CM

## 2021-09-16 MED ORDER — FREESTYLE LIBRE 2 SENSOR MISC: 3 refills | Status: DC

## 2021-09-21 DIAGNOSIS — E559 Vitamin D deficiency, unspecified: Secondary | ICD-10-CM | POA: Diagnosis not present

## 2021-09-21 DIAGNOSIS — M546 Pain in thoracic spine: Secondary | ICD-10-CM | POA: Diagnosis not present

## 2021-09-21 DIAGNOSIS — M545 Low back pain, unspecified: Secondary | ICD-10-CM | POA: Diagnosis not present

## 2021-09-21 DIAGNOSIS — Z79899 Other long term (current) drug therapy: Secondary | ICD-10-CM | POA: Diagnosis not present

## 2021-09-21 DIAGNOSIS — E119 Type 2 diabetes mellitus without complications: Secondary | ICD-10-CM | POA: Diagnosis not present

## 2021-09-21 DIAGNOSIS — M542 Cervicalgia: Secondary | ICD-10-CM | POA: Diagnosis not present

## 2021-09-21 DIAGNOSIS — Z0000 Encounter for general adult medical examination without abnormal findings: Secondary | ICD-10-CM | POA: Diagnosis not present

## 2021-09-21 DIAGNOSIS — M5136 Other intervertebral disc degeneration, lumbar region: Secondary | ICD-10-CM | POA: Diagnosis not present

## 2021-09-23 DIAGNOSIS — Z79899 Other long term (current) drug therapy: Secondary | ICD-10-CM | POA: Diagnosis not present

## 2021-09-29 ENCOUNTER — Encounter (HOSPITAL_COMMUNITY): Payer: Self-pay

## 2021-10-01 DIAGNOSIS — E1140 Type 2 diabetes mellitus with diabetic neuropathy, unspecified: Secondary | ICD-10-CM | POA: Diagnosis not present

## 2021-10-02 ENCOUNTER — Other Ambulatory Visit: Payer: Self-pay | Admitting: Internal Medicine

## 2021-10-11 DIAGNOSIS — G4733 Obstructive sleep apnea (adult) (pediatric): Secondary | ICD-10-CM | POA: Diagnosis not present

## 2021-10-22 DIAGNOSIS — M545 Low back pain, unspecified: Secondary | ICD-10-CM | POA: Diagnosis not present

## 2021-10-22 DIAGNOSIS — Z79899 Other long term (current) drug therapy: Secondary | ICD-10-CM | POA: Diagnosis not present

## 2021-10-22 DIAGNOSIS — E119 Type 2 diabetes mellitus without complications: Secondary | ICD-10-CM | POA: Diagnosis not present

## 2021-10-22 DIAGNOSIS — M542 Cervicalgia: Secondary | ICD-10-CM | POA: Diagnosis not present

## 2021-10-22 DIAGNOSIS — M5136 Other intervertebral disc degeneration, lumbar region: Secondary | ICD-10-CM | POA: Diagnosis not present

## 2021-10-22 DIAGNOSIS — M546 Pain in thoracic spine: Secondary | ICD-10-CM | POA: Diagnosis not present

## 2021-10-22 DIAGNOSIS — M25552 Pain in left hip: Secondary | ICD-10-CM | POA: Diagnosis not present

## 2021-10-26 DIAGNOSIS — Z79899 Other long term (current) drug therapy: Secondary | ICD-10-CM | POA: Diagnosis not present

## 2021-10-31 IMAGING — DX DG CHEST 2V
2 series · 2 of 2 positions shown · non-contrast
Comparison: 11/16/2017

CLINICAL DATA: Former smoker

EXAM:
CHEST - 2 VIEW

[chest pa]
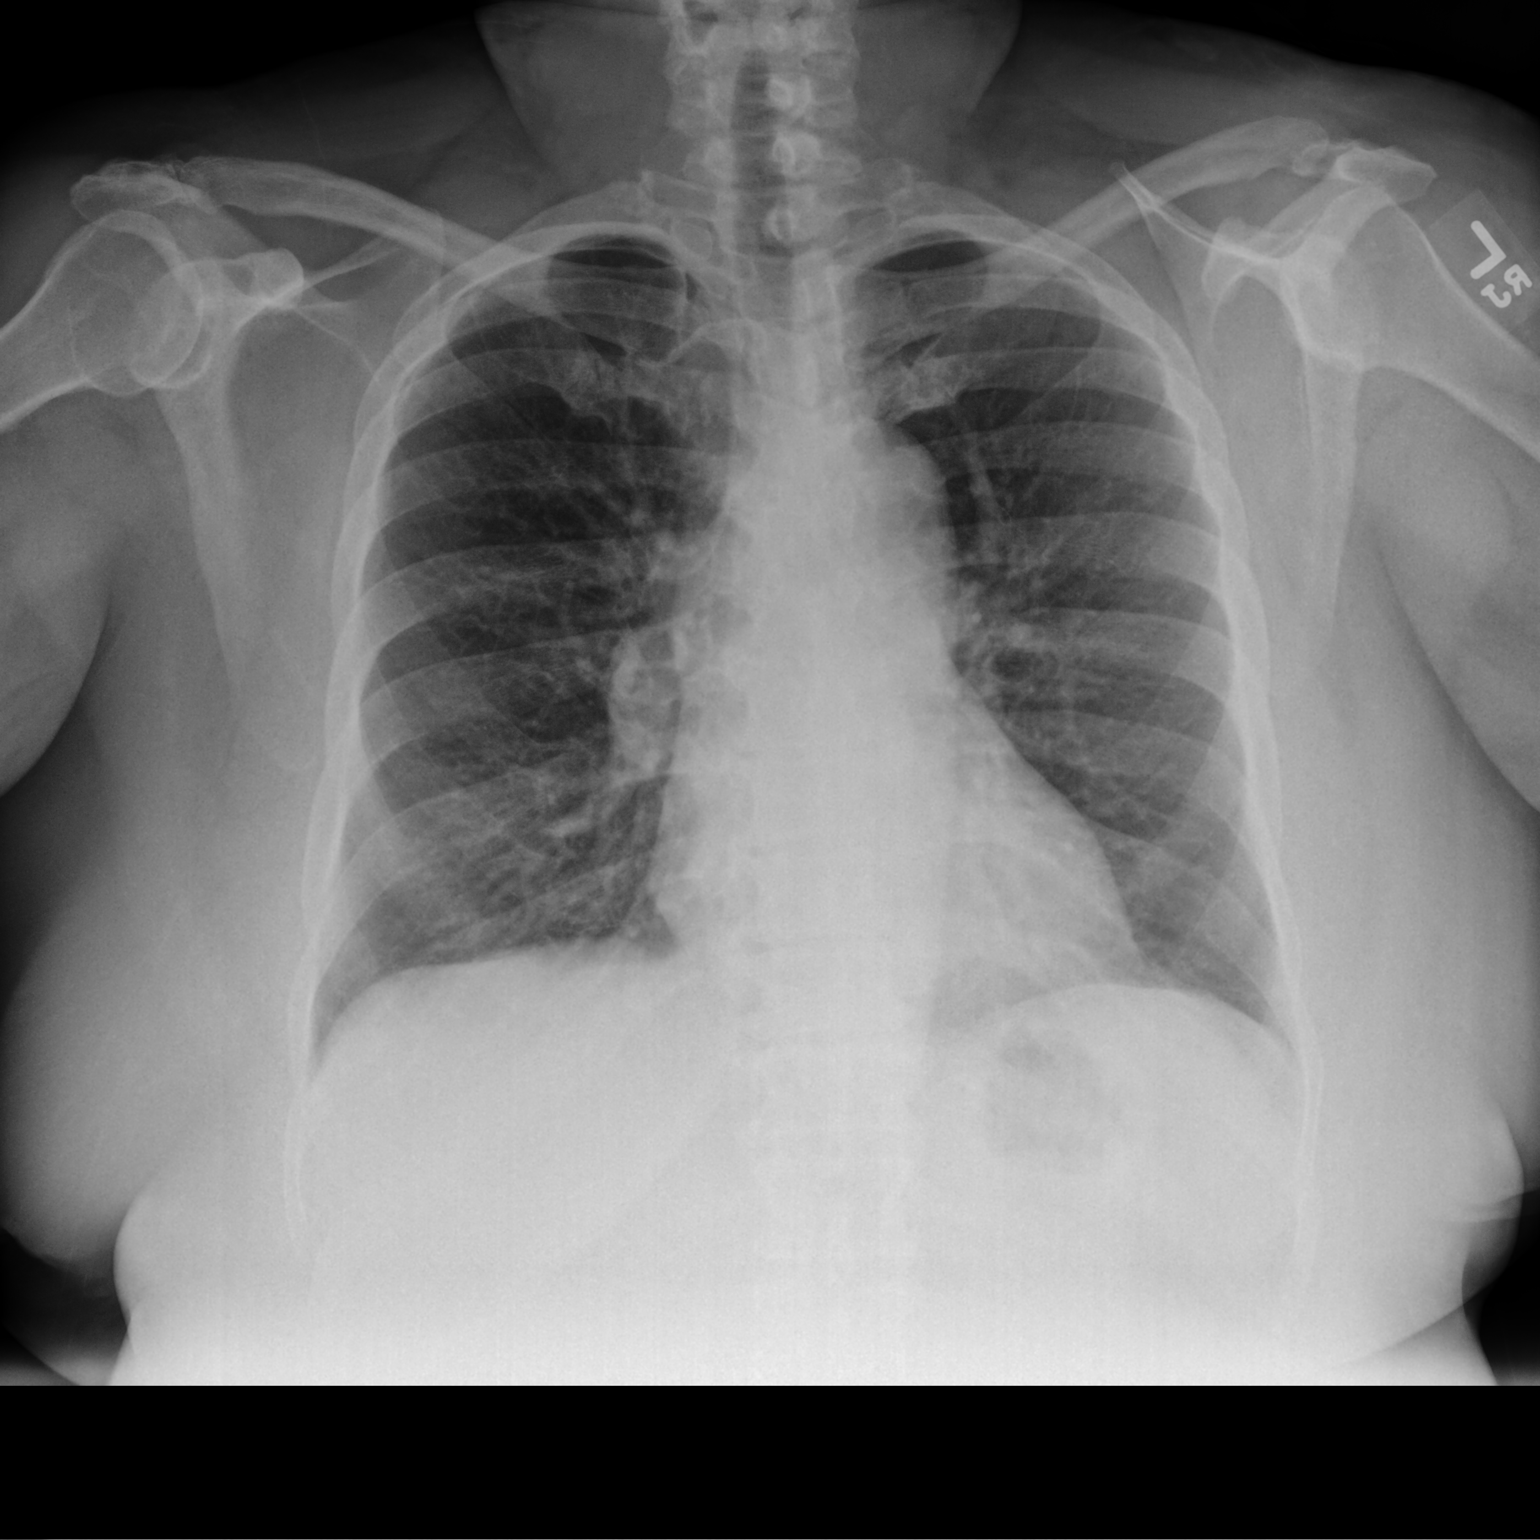

[chest lat]
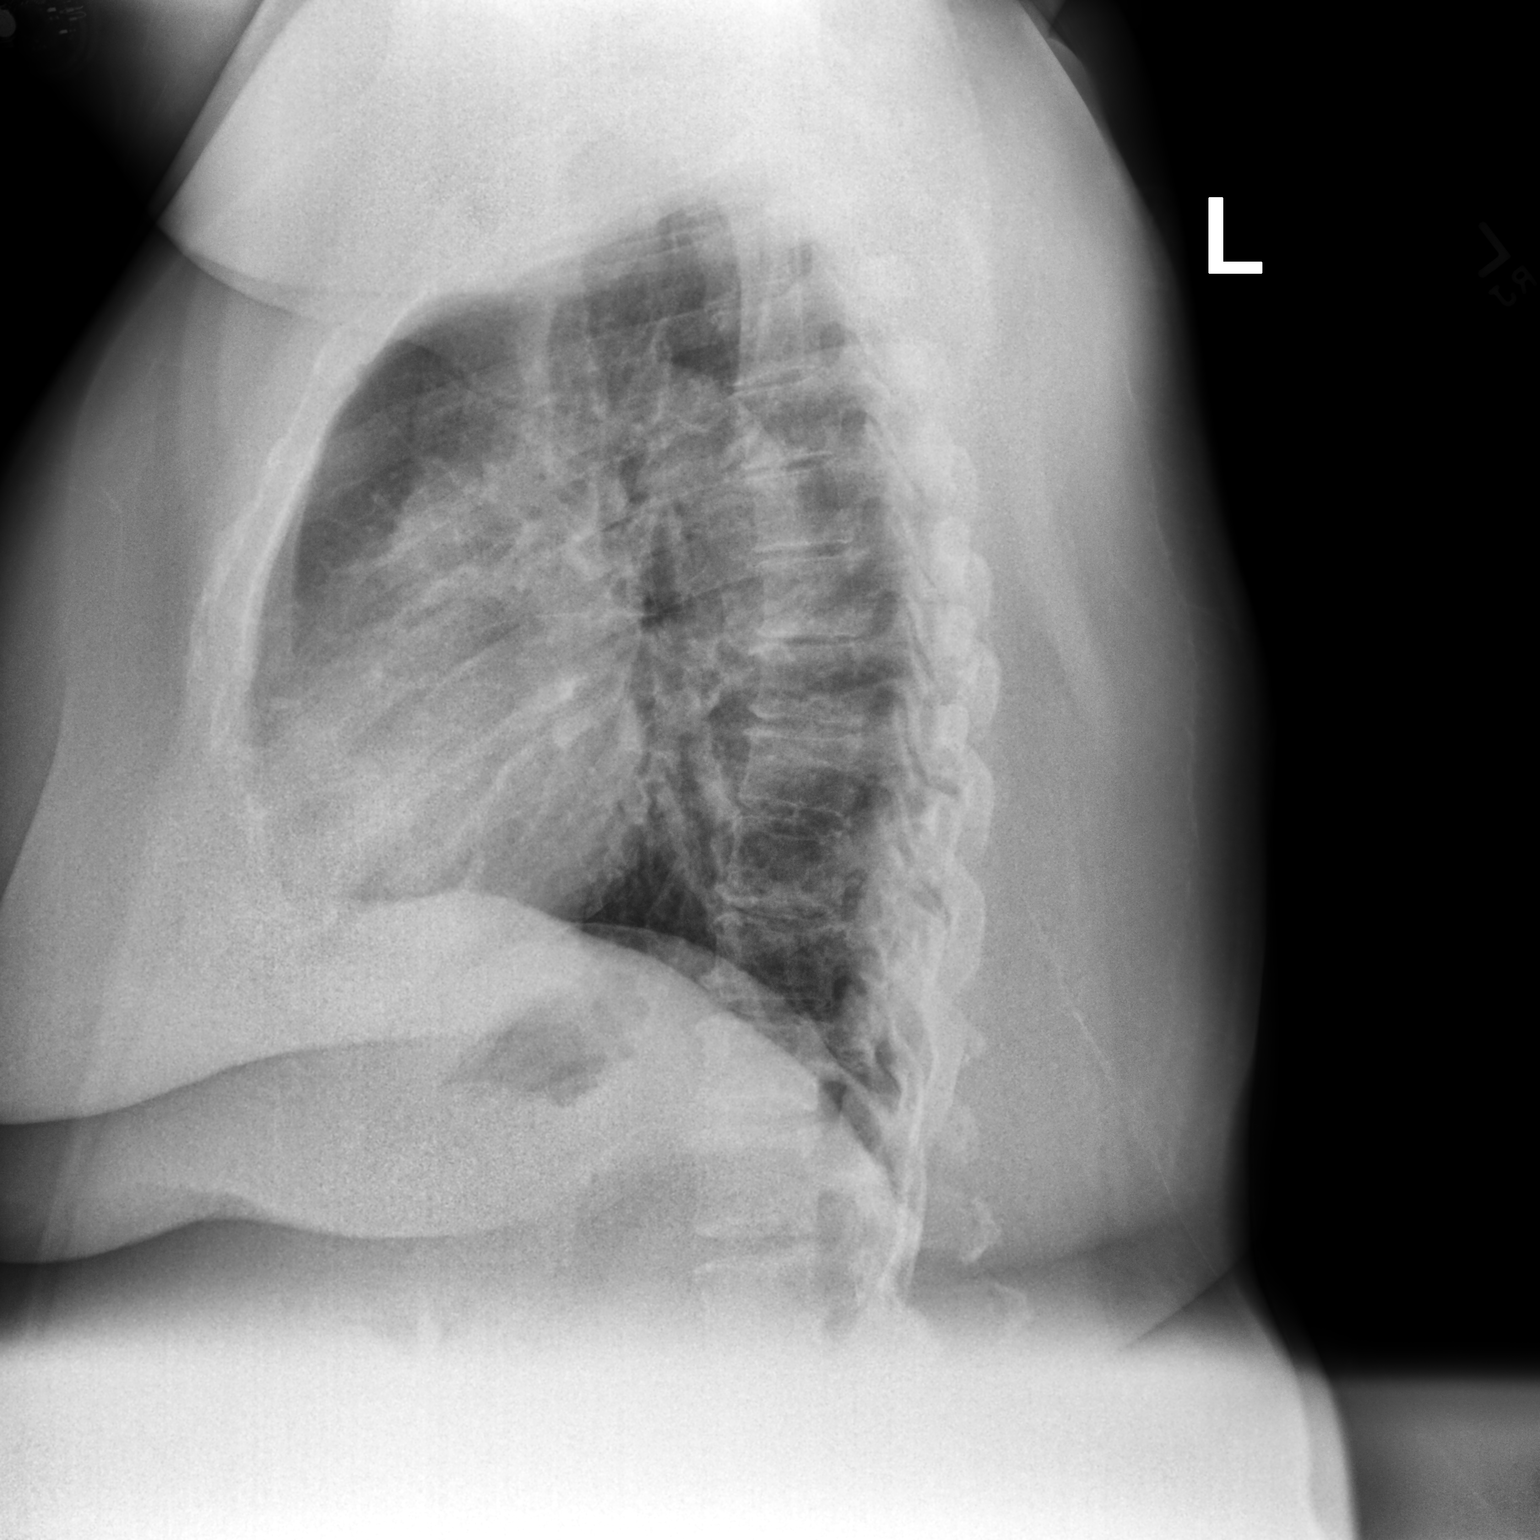

[2 of 2 positions shown; findings below may reference images not displayed]

FINDINGS: The heart size and mediastinal contours are within normal limits.
Both lungs are clear. No pleural effusion. The visualized skeletal
structures are unremarkable.
IMPRESSION: No acute process in the chest.

## 2021-11-02 DIAGNOSIS — E1140 Type 2 diabetes mellitus with diabetic neuropathy, unspecified: Secondary | ICD-10-CM | POA: Diagnosis not present

## 2021-11-09 ENCOUNTER — Telehealth: Payer: Self-pay | Admitting: Emergency Medicine

## 2021-11-09 ENCOUNTER — Other Ambulatory Visit: Payer: Self-pay | Admitting: *Deleted

## 2021-11-09 MED ORDER — GABAPENTIN 600 MG PO TABS: 600.0000 mg | ORAL_TABLET | Freq: Two times a day (BID) | ORAL | 1 refills | Status: DC

## 2021-11-09 NOTE — Telephone Encounter (Signed)
It is okay.  Thanks.

## 2021-11-09 NOTE — Telephone Encounter (Signed)
1.Medication Requested: gabapentin (NEURONTIN) 600 MG tablet metFORMIN (GLUCOPHAGE) 1000 MG tablet Pravastatin  ezetimibe (ZETIA) 10 MG tablet (Expired) dicyclomine (BENTYL) 10 MG capsule Omeprazole  famotidine (PEPCID) 20 MG tablet FARXIGA 10 MG TABS tablet hydrochlorothiazide (HYDRODIURIL) 25 MG tablet rosuvastatin (CRESTOR) 10 MG tablet  2. Pharmacy (Name, Street, [place]): [facility] ([place]), [place] - [address] Phone:  [phone]  Fax:  [phone]     3. On Med List: yes  4. Last Visit with PCP:  5. Next visit date with PCP:   Agent: Please be advised that RX refills may take up to 3 business days. We ask that you follow-up with your pharmacy.

## 2021-11-09 NOTE — Telephone Encounter (Signed)
Prescription request for gabapentin sent to pharmacy on file

## 2021-11-10 DIAGNOSIS — G4733 Obstructive sleep apnea (adult) (pediatric): Secondary | ICD-10-CM | POA: Diagnosis not present

## 2021-11-12 ENCOUNTER — Other Ambulatory Visit: Payer: Self-pay | Admitting: Gastroenterology

## 2021-11-12 DIAGNOSIS — G4733 Obstructive sleep apnea (adult) (pediatric): Secondary | ICD-10-CM | POA: Diagnosis not present

## 2021-11-12 DIAGNOSIS — I10 Essential (primary) hypertension: Secondary | ICD-10-CM | POA: Diagnosis not present

## 2021-11-12 DIAGNOSIS — R1013 Epigastric pain: Secondary | ICD-10-CM

## 2021-11-12 DIAGNOSIS — C50919 Malignant neoplasm of unspecified site of unspecified female breast: Secondary | ICD-10-CM | POA: Diagnosis not present

## 2021-11-12 DIAGNOSIS — G8929 Other chronic pain: Secondary | ICD-10-CM | POA: Diagnosis not present

## 2021-11-12 DIAGNOSIS — G620 Drug-induced polyneuropathy: Secondary | ICD-10-CM | POA: Diagnosis not present

## 2021-11-12 DIAGNOSIS — Z6841 Body Mass Index (BMI) 40.0 and over, adult: Secondary | ICD-10-CM | POA: Diagnosis not present

## 2021-11-12 DIAGNOSIS — E1169 Type 2 diabetes mellitus with other specified complication: Secondary | ICD-10-CM | POA: Diagnosis not present

## 2021-11-12 DIAGNOSIS — J309 Allergic rhinitis, unspecified: Secondary | ICD-10-CM | POA: Diagnosis not present

## 2021-11-12 DIAGNOSIS — Z794 Long term (current) use of insulin: Secondary | ICD-10-CM | POA: Diagnosis not present

## 2021-11-12 DIAGNOSIS — E785 Hyperlipidemia, unspecified: Secondary | ICD-10-CM | POA: Diagnosis not present

## 2021-11-12 DIAGNOSIS — K2970 Gastritis, unspecified, without bleeding: Secondary | ICD-10-CM

## 2021-11-12 DIAGNOSIS — J449 Chronic obstructive pulmonary disease, unspecified: Secondary | ICD-10-CM | POA: Diagnosis not present

## 2021-11-16 ENCOUNTER — Telehealth: Payer: Self-pay | Admitting: Emergency Medicine

## 2021-11-16 ENCOUNTER — Other Ambulatory Visit: Payer: Self-pay

## 2021-11-16 ENCOUNTER — Telehealth: Payer: Self-pay

## 2021-11-16 MED ORDER — FREESTYLE LIBRE 2 SENSOR MISC: 3 refills | Status: DC

## 2021-11-16 MED ORDER — OZEMPIC (2 MG/DOSE) 8 MG/3ML ~~LOC~~ SOPN: 2.0000 mg | PEN_INJECTOR | SUBCUTANEOUS | 3 refills | Status: DC

## 2021-11-16 MED ORDER — METFORMIN HCL 1000 MG PO TABS: 1000.0000 mg | ORAL_TABLET | Freq: Two times a day (BID) | ORAL | 0 refills | Status: DC

## 2021-11-16 MED ORDER — DAPAGLIFLOZIN PROPANEDIOL 10 MG PO TABS: 10.0000 mg | ORAL_TABLET | Freq: Every day | ORAL | 0 refills | Status: DC

## 2021-11-16 MED ORDER — FREESTYLE LIBRE 3 SENSOR MISC: 1.0000 | 3 refills | Status: DC

## 2021-11-16 NOTE — Telephone Encounter (Signed)
Patient is scheduled to come in for a nurse visit as we are unable to send rx for prevnar vaccine to the pharmacy

## 2021-11-16 NOTE — Telephone Encounter (Signed)
Patient was told at Pharmacy that there is a new Pneumonia shot that she can get - but that she will need a rx for it in order for her to be able to take this.  [place]  Please advise.

## 2021-11-16 NOTE — Telephone Encounter (Signed)
Patient would like to switch to the [facility] 3.

## 2021-11-16 NOTE — Telephone Encounter (Signed)
Patient will try to download app to see if her phone is compatible and then call back if she needs to go back to [employer] 2

## 2021-11-16 NOTE — Telephone Encounter (Signed)
Prevnar 20.  Thanks.

## 2021-11-23 DIAGNOSIS — M5136 Other intervertebral disc degeneration, lumbar region: Secondary | ICD-10-CM | POA: Diagnosis not present

## 2021-11-23 DIAGNOSIS — M546 Pain in thoracic spine: Secondary | ICD-10-CM | POA: Diagnosis not present

## 2021-11-23 DIAGNOSIS — M542 Cervicalgia: Secondary | ICD-10-CM | POA: Diagnosis not present

## 2021-11-23 DIAGNOSIS — M545 Low back pain, unspecified: Secondary | ICD-10-CM | POA: Diagnosis not present

## 2021-11-23 DIAGNOSIS — Z79899 Other long term (current) drug therapy: Secondary | ICD-10-CM | POA: Diagnosis not present

## 2021-11-24 NOTE — Progress Notes (Signed)
HPI F former smoker followed for OSA, complicated by  HBP, COPD GOLD II, Hepatic steatosis, GERD, DM2, Lung nodule< 6 cm NPSG 08/11/17- AHI 17.7/ hr, desaturation to 85%, 255 lbs PFT- 01/31/17- moderate obst, insignif resp to BD, mild DLCO deficit -----------------------------------------------------------------------------------  11/26/20-  60 yoF former smoker followed for OSA, complicated by  HBP, COPD GOLD II, Hepatic steatosis, GERD, DM2, Sciatica, Morbid Obesity, Chronic Low Back Pain/ [facility],  Dr Melvyn Novas has seen her for cough CPAP auto 10-20/ [facility] Download- compliance 93%, AHI 0.4/ hr Body weight today-243 lbs Covid vax- -----Was seen by NP in March- preop clearance before lumbar spine fusion in April- not done. Sometimes wakes up with a productive cough in the morning with using CPAP, brownish sputum. Recent week or 2. No fever.  Fine with CPAP- no concerns. Mentions occ heartburn at night. Takes PeptoBismol and notes "black tongue' next morning. Explained that is the PB, not the CPAP. Discuss heartburn with PCP. CXR 05/28/20- IMPRESSION: No acute process in the chest.  11/26/21- 61 yoF former smoker followed for OSA, complicated by  HTN, COPD GOLD II, Hepatic steatosis, GERD, DM2, Sciatica, Morbid Obesity, Chronic Low Back Pain/ [facility], Breast Cancer R,  Dr Melvyn Novas has seen her for cough -Ventiolin, Symbicort 80, ChlorTrimeton, Singulair,  CPAP auto 10-20/ [facility] Download- compliance 97%, AHI 0.3/ hr Body weight today- Covid vax-4 [employer] -----Patient feels like she has been clearing her throat more recently in the morning and throughout the day with clear/yellow sputum possibly from the weather change. Has had some wheezing.  Download reviewed. She again talks about morning cough.  Sputum sometimes a little yellow but scant.  She is on acid blockers and does not recognize reflux.  I noted her sniffing a few times here in the office but she denies nasal  drainage and takes occasional antihistamine.  Some wheeze.  We discussed trying Breztri.  She is trying to walk more for exercise and weight.   ROS-see HPI   + = positive Constitutional:    weight loss, night sweats, fevers, chills, fatigue, lassitude. HEENT:    headaches, difficulty swallowing, tooth/dental problems, sore throat,       sneezing, itching, ear ache, +nasal congestion, post nasal drip, snoring CV:    chest pain, orthopnea, PND, +swelling in lower extremities, anasarca,                                  dizziness, palpitations Resp:  + shortness of breath with exertion or at rest.                productive cough,   non-productive cough, coughing up of blood.              change in color of mucus. + wheezing.   Skin:    rash or lesions. GI:  + heartburn, indigestion, +abdominal pain, nausea, vomiting, diarrhea,                 change in bowel habits, loss of appetite GU: dysuria, change in color of urine, no urgency or frequency.   flank pain. MS:   +joint pain, stiffness, decreased range of motion, back pain. Neuro-     nothing unusual Psych:  change in mood or affect.  depression or anxiety.   memory loss.  OBJ- Physical Exam General- Alert, Oriented, Affect-appropriate, Distress- none acute, + obese Skin- rash-none, lesions- none, excoriation- none  Lymphadenopathy- none Head- atraumatic            Eyes- Gross vision intact, PERRLA, conjunctivae and secretions clear            Ears- Hearing, canals-normal            Nose- Clear, no-Septal dev, mucus, polyps, erosion, perforation             Throat- Mallampati IV , mucosa clear , drainage- none, tonsils- atrophic Neck- flexible , trachea midline, no stridor , thyroid nl, carotid no bruit Chest - symmetrical excursion , unlabored           Heart/CV- RRR , no murmur , no gallop  , no rub, nl s1 s2                           - JVD- none , edema- none, stasis changes- none, varices- none           Lung- clear to P&A, wheeze-  none, cough- none , dullness-none, rub- none           Chest wall-  Abd-  Br/ Gen/ Rectal- Not done, not indicated Extrem- cyanosis- none, clubbing, none, atrophy- none, strength- nl Neuro- grossly intact to observation

## 2021-11-26 ENCOUNTER — Ambulatory Visit: Payer: PPO | Admitting: Internal Medicine

## 2021-11-26 ENCOUNTER — Encounter: Payer: Self-pay | Admitting: Internal Medicine

## 2021-11-26 VITALS — BP 120/60 | HR 85 | Temp 98.1°F | Ht 64.0 in | Wt 247.0 lb

## 2021-11-26 DIAGNOSIS — R053 Chronic cough: Secondary | ICD-10-CM

## 2021-11-26 DIAGNOSIS — Z9989 Dependence on other enabling machines and devices: Secondary | ICD-10-CM | POA: Diagnosis not present

## 2021-11-26 DIAGNOSIS — G4733 Obstructive sleep apnea (adult) (pediatric): Secondary | ICD-10-CM | POA: Diagnosis not present

## 2021-11-26 DIAGNOSIS — Z79899 Other long term (current) drug therapy: Secondary | ICD-10-CM | POA: Diagnosis not present

## 2021-11-26 MED ORDER — BREZTRI AEROSPHERE 160-9-4.8 MCG/ACT IN AERO
2.0000 | INHALATION_SPRAY | Freq: Two times a day (BID) | RESPIRATORY_TRACT | 0 refills | Status: DC
Start: 2021-11-26 — End: 2022-02-04

## 2021-11-26 NOTE — Assessment & Plan Note (Signed)
Benefits from CPAP with good compliance and control.  I do not think this is what is causing her cough. Plan-continue auto 10-20

## 2021-11-26 NOTE — Assessment & Plan Note (Signed)
Probably allergic asthma/chronic bronchitis related to COPD history from previous smoking. Plan-sample trial of Breztri instead of Symbicort

## 2021-11-26 NOTE — Patient Instructions (Signed)
Order- sample x 2 Breztri inhaler    inhale 2 puffs then rinse mouth, twice daily Try this instead of Symbicort. If it is definitely better we can send new prescription. Otherwise we will go back to Symbicort.  Order- DME [facility]- please replace old CPAP machine auto 10-20, mask of choice, humidifier, supplies. Please install AirView/ card

## 2021-11-27 ENCOUNTER — Other Ambulatory Visit (HOSPITAL_COMMUNITY): Payer: Self-pay

## 2021-11-29 ENCOUNTER — Other Ambulatory Visit (HOSPITAL_COMMUNITY): Payer: Self-pay

## 2021-12-01 NOTE — Progress Notes (Unsigned)
12/01/2021 [name] [id number] 1961-05-25   Chief Complaint: Stomach pain   History of Present Illness: [name]. Stare is a 61 year old female with a past medical history of hypertension, hyperlipidemia, breast cancer s/p right lumpectomy and chemo and radiation 02/2021, DM II, COPD, sleep apnea, chronic back pain, hepatic steatosis, GERD and colon polyps.   She presents today for further evaluation regarding   EGD 12/25/2019 by Dr. Fuller Plan: - Normal esophagus. - Gastritis. Biopsied. - Normal duodenal bulb and second portion of the duodenum. - MILD CHRONIC GASTRITIS. MILD REACTIVE GASTROPATHY. [name] STAIN IS NEGATIVE FOR HELICOBACTER PYLORI.  EGD 10/14/2016: - Normal esophagus. - Erythematous mucosa in the gastric body. Biopsied. - Erythematous duodenopathy. - Normal second portion of the duodenum.  Colonoscopy 10/14/2016: Three 6 to 7 mm polyps in the descending colon and in the transverse colon, removed with a cold snare. Resected and retrieved. - Three 4 to 5 mm polyps in the sigmoid colon, in the transverse colon and in the ascending colon, removed with a cold biopsy forceps. Resected and retrieved. - Internal hemorrhoids. - The examination was otherwise normal on direct and retroflexion views. - 5 year recall colonoscopy  1. Surgical [P], sigmoid, ascending, descending, transverse x2, polyp (5) - TUBULAR ADENOMA, ONE FRAGMENT. - BENIGN COLORECTAL MUCOSA, REMAINING FRAGMENTS. - BENIGN LYMPHOID AGGREGATES ARE PRESENT. - NO HIGH GRADE DYSPLASIA OR MALIGNANCY IDENTIFIED. 2. Surgical [P], gastric body - BENIGN OXYNTIC BODY-TYPE GASTRIC MUCOSA SHOWING MILD CHRONIC INACTIVE GASTRITIS. - WARTHIN-STARRY STAIN IS NEGATIVE FOR HELICOBACTER PYLORI. - NO INTESTINAL METAPLASIA, DYSPLASIA, OR MALIGNANCY.      Latest Ref Rng & Units 02/24/2021   12:22 PM 12/18/2019   12:06 PM 03/26/2018    9:34 AM  CBC  WBC 4.0 - 10.5 K/uL 7.9  5.6  5.7   Hemoglobin 12.0 - 15.0 g/dL  15.2  13.5  13.2   Hematocrit 36.0 - 46.0 % 47.4  42.0  40.9   Platelets 150 - 400 K/uL 269  254.0  248.0        Latest Ref Rng & Units 02/24/2021   12:22 PM 02/19/2021    9:13 AM 11/06/2020    1:48 PM  CMP  Glucose 70 - 99 mg/dL 263  175  154   BUN 6 - 20 mg/dL '11  12  12   '$ Creatinine 0.44 - 1.00 mg/dL 0.96  0.71  0.68   Sodium 135 - 145 mmol/L 138  139  139   Potassium 3.5 - 5.1 mmol/L 3.9  4.0  3.8   Chloride 98 - 111 mmol/L 96  98  100   CO2 22 - 32 mmol/L '27  31  29   '$ Calcium 8.9 - 10.3 mg/dL 10.3  9.6  9.9   Total Protein 6.5 - 8.1 g/dL 8.3     Total Bilirubin 0.3 - 1.2 mg/dL 0.4     Alkaline Phos 38 - 126 U/L 63     AST 15 - 41 U/L 27     ALT 0 - 44 U/L 30       RUQ sonogram 01/06/2020: ULTRASOUND ABDOMEN LIMITED RIGHT UPPER QUADRANT   COMPARISON:  CT abdomen and pelvis December 19, 2019   Gallbladder: No gallstones or wall thickening visualized. There is no pericholecystic fluid. No sonographic Murphy sign noted by sonographer.   Common bile duct: Diameter: 3 mm. No intrahepatic or extrahepatic biliary duct dilatation.   Liver: No focal lesion identified. There is diffuse increase  in liver echogenicity, a finding indicative of hepatic steatosis. There is an area of apparent fatty sparing near the gallbladder fossa. Liver measures 22 cm in length, prominent. Portal vein is patent on color Doppler imaging with normal direction of blood flow towards the liver.   IMPRESSION: Prominent liver with diffuse increased echogenicity consistent with hepatic steatosis. Apparent fatty sparing near the gallbladder fossa. Beyond fatty sparing, no focal liver lesions evident. Note that the sensitivity of ultrasound for detection of focal liver lesions is diminished in this circumstance.  Study otherwise unremarkable.  CTAP 12/19/2019: 1. No CT evidence of acute pancreatitis. No ductal dilation or peripancreatic fluid. Splenic vein is patent. Portal vein and SMV are patent. 2.  Marked hepatic steatosis and mild hepatomegaly. 3. Stable appearance of RIGHT lower lobe pulmonary nodule measuring approximately 8 mm short axis. Compatible with small benign nodule not changed since 2018. 4. Aortic atherosclerosis.    Past Medical History:  Diagnosis Date   Allergy    Arthritis    back    Asthma    AS CHILD   Chronic back pain    Chronic leg pain    due to back pain   Cocaine abuse ([place])    in remission   COPD (chronic obstructive pulmonary disease) ([place])    Diabetes ([place])    with neuropathy   GERD (gastroesophageal reflux disease)    Hyperlipidemia    Hypertension    Intraductal papilloma of left breast 02/18/2016   Neuromuscular disorder (H[facility])    neuropathy   Postlaminectomy syndrome of lumbar region 12/07/2011   RECTAL BLEEDING 12/09/2008   Annotation: s/p EGD 7/08 mild gastritis, s/p colonoscopy 7/08- benign polyp  s/p polypectomy and isolated diverticulum.  Qualifier: Diagnosis of  By: Ditzler RN, Debra     Sciatica    per patient    Sleep apnea    CPAP    YEARS AGO DONE 1/2 YEARS AGO AND WAS TOLD DID NOT HAVE   Tobacco abuse    Uterine fibroid    s/p hysterectomy   Past Surgical History:  Procedure Laterality Date   BACK SURGERY  2012   L5-S1 microendoscopic disectomy last surgery 06/2011   BREAST BIOPSY     LEFT    01/19/16   BREAST EXCISIONAL BIOPSY Left 02/2016   BREAST LUMPECTOMY WITH RADIOACTIVE SEED AND SENTINEL LYMPH NODE BIOPSY Right 03/12/2021   Procedure: RIGHT BREAST LUMPECTOMY WITH RADIOACTIVE SEED AND SENTINEL LYMPH NODE BIOPSY;  Surgeon: Jovita Kussmaul, MD;  Location: [place];  Service: General;  Laterality: Right;   BREAST REDUCTION SURGERY  1982   CATARACT EXTRACTION Right 11/07/2019   COLONOSCOPY     HAND SURGERY     MIDDLE TRIGGER FINGER RIGHT SIDE   RADIOACTIVE SEED GUIDED EXCISIONAL BREAST BIOPSY Left 02/18/2016   Procedure: LEFT RADIOACTIVE SEED GUIDED EXCISIONAL BREAST BIOPSY;  Surgeon: Alphonsa Overall, MD;  Location: [place];   Service: General;  Laterality: Left;   REDUCTION MAMMAPLASTY Bilateral    TONSILLECTOMY  2008   TOTAL ABDOMINAL HYSTERECTOMY  05/24/2007   hysterectomy   TUBAL LIGATION     UPPER GASTROINTESTINAL ENDOSCOPY         Current Medications, Allergies, Past Medical History, Past Surgical History, Family History and Social History were reviewed in [employer] record.   Review of Systems:   Constitutional: Negative for fever, sweats, chills or weight loss.  Respiratory: Negative for shortness of breath.   Cardiovascular: Negative for chest pain, palpitations and leg  swelling.  Gastrointestinal: See HPI.  Musculoskeletal: Negative for back pain or muscle aches.  Neurological: Negative for dizziness, headaches or paresthesias.    Physical Exam: There were no vitals taken for this visit. General: Well developed, w   ***female in no acute distress. Head: Normocephalic and atraumatic. Eyes: No scleral icterus. Conjunctiva pink . Ears: Normal auditory acuity. Mouth: Dentition intact. No ulcers or lesions.  Lungs: Clear throughout to auscultation. Heart: Regular rate and rhythm, no murmur. Abdomen: Soft, nontender and nondistended. No masses or hepatomegaly. Normal bowel sounds x 4 quadrants.  Rectal: *** Musculoskeletal: Symmetrical with no gross deformities. Extremities: No edema. Neurological: Alert oriented x 4. No focal deficits.  Psychological: Alert and cooperative. Normal mood and affect  Assessment and Recommendations:   5) 61 year old female with a history of GERD  2) Colon polyps   3) History of breast cancer 02/2021  4) Hepatic steatosis

## 2021-12-02 ENCOUNTER — Encounter: Payer: Self-pay | Admitting: *Deleted

## 2021-12-02 ENCOUNTER — Ambulatory Visit: Payer: PPO | Admitting: Nurse Practitioner

## 2021-12-03 ENCOUNTER — Telehealth: Payer: Self-pay | Admitting: Internal Medicine

## 2021-12-03 DIAGNOSIS — E1140 Type 2 diabetes mellitus with diabetic neuropathy, unspecified: Secondary | ICD-10-CM | POA: Diagnosis not present

## 2021-12-03 MED ORDER — BENZONATATE 200 MG PO CAPS: 200.0000 mg | ORAL_CAPSULE | Freq: Three times a day (TID) | ORAL | 1 refills | Status: DC | PRN

## 2021-12-03 MED ORDER — MONTELUKAST SODIUM 10 MG PO TABS
10.0000 mg | ORAL_TABLET | Freq: Every day | ORAL | 11 refills | Status: DC
Start: 2021-12-03 — End: 2022-11-08

## 2021-12-07 ENCOUNTER — Telehealth: Payer: Self-pay | Admitting: Gastroenterology

## 2021-12-07 ENCOUNTER — Other Ambulatory Visit: Payer: Self-pay | Admitting: Emergency Medicine

## 2021-12-07 DIAGNOSIS — H1045 Other chronic allergic conjunctivitis: Secondary | ICD-10-CM | POA: Diagnosis not present

## 2021-12-07 DIAGNOSIS — H40031 Anatomical narrow angle, right eye: Secondary | ICD-10-CM | POA: Diagnosis not present

## 2021-12-07 DIAGNOSIS — H0102A Squamous blepharitis right eye, upper and lower eyelids: Secondary | ICD-10-CM | POA: Diagnosis not present

## 2021-12-07 DIAGNOSIS — H40013 Open angle with borderline findings, low risk, bilateral: Secondary | ICD-10-CM | POA: Diagnosis not present

## 2021-12-07 DIAGNOSIS — E119 Type 2 diabetes mellitus without complications: Secondary | ICD-10-CM | POA: Diagnosis not present

## 2021-12-07 DIAGNOSIS — H04123 Dry eye syndrome of bilateral lacrimal glands: Secondary | ICD-10-CM | POA: Diagnosis not present

## 2021-12-07 DIAGNOSIS — H0102B Squamous blepharitis left eye, upper and lower eyelids: Secondary | ICD-10-CM | POA: Diagnosis not present

## 2021-12-08 ENCOUNTER — Other Ambulatory Visit: Payer: Self-pay

## 2021-12-08 ENCOUNTER — Other Ambulatory Visit (HOSPITAL_COMMUNITY): Payer: Self-pay

## 2021-12-08 MED ORDER — FAMOTIDINE 20 MG PO TABS
20.0000 mg | ORAL_TABLET | Freq: Two times a day (BID) | ORAL | 2 refills | Status: DC | PRN
  Filled 2021-12-08 – 2021-12-21 (×2): qty 60, 30d supply, fill #0

## 2021-12-08 NOTE — Telephone Encounter (Signed)
Left message for pt to call back  °

## 2021-12-09 MED ORDER — ESOMEPRAZOLE MAGNESIUM 40 MG PO CPDR
40.0000 mg | DELAYED_RELEASE_CAPSULE | Freq: Two times a day (BID) | ORAL | 3 refills | Status: DC
Start: 2021-12-09 — End: 2022-02-04

## 2021-12-09 NOTE — Telephone Encounter (Signed)
Spoke with pt and let her know she can restart nexium. Nexium sent to pt's pharmacy. Pt verbalized understanding and had no other concerns at end of call.

## 2021-12-10 DIAGNOSIS — G4733 Obstructive sleep apnea (adult) (pediatric): Secondary | ICD-10-CM | POA: Diagnosis not present

## 2021-12-13 ENCOUNTER — Other Ambulatory Visit (HOSPITAL_COMMUNITY): Payer: Self-pay

## 2021-12-18 ENCOUNTER — Other Ambulatory Visit (HOSPITAL_COMMUNITY): Payer: Self-pay

## 2021-12-21 ENCOUNTER — Other Ambulatory Visit: Payer: Self-pay | Admitting: Adult Health

## 2021-12-21 ENCOUNTER — Other Ambulatory Visit (HOSPITAL_COMMUNITY): Payer: Self-pay

## 2021-12-21 ENCOUNTER — Ambulatory Visit: Payer: PPO | Admitting: Emergency Medicine

## 2021-12-21 DIAGNOSIS — M546 Pain in thoracic spine: Secondary | ICD-10-CM | POA: Diagnosis not present

## 2021-12-21 DIAGNOSIS — M542 Cervicalgia: Secondary | ICD-10-CM | POA: Diagnosis not present

## 2021-12-21 DIAGNOSIS — E119 Type 2 diabetes mellitus without complications: Secondary | ICD-10-CM | POA: Diagnosis not present

## 2021-12-21 DIAGNOSIS — Z79899 Other long term (current) drug therapy: Secondary | ICD-10-CM | POA: Diagnosis not present

## 2021-12-21 DIAGNOSIS — M5136 Other intervertebral disc degeneration, lumbar region: Secondary | ICD-10-CM | POA: Diagnosis not present

## 2021-12-21 DIAGNOSIS — E559 Vitamin D deficiency, unspecified: Secondary | ICD-10-CM | POA: Diagnosis not present

## 2021-12-21 DIAGNOSIS — M545 Low back pain, unspecified: Secondary | ICD-10-CM | POA: Diagnosis not present

## 2021-12-21 DIAGNOSIS — M25552 Pain in left hip: Secondary | ICD-10-CM | POA: Diagnosis not present

## 2021-12-21 DIAGNOSIS — I10 Essential (primary) hypertension: Secondary | ICD-10-CM

## 2021-12-21 MED ORDER — HYDROCHLOROTHIAZIDE 25 MG PO TABS
25.0000 mg | ORAL_TABLET | Freq: Every day | ORAL | 1 refills | Status: DC
  Filled 2021-12-21 – 2022-03-03 (×2): qty 90, 90d supply, fill #0
  Filled 2022-05-29: qty 90, 90d supply, fill #1

## 2021-12-23 DIAGNOSIS — Z79899 Other long term (current) drug therapy: Secondary | ICD-10-CM | POA: Diagnosis not present

## 2021-12-24 ENCOUNTER — Ambulatory Visit: Payer: PPO

## 2021-12-30 ENCOUNTER — Other Ambulatory Visit: Payer: Self-pay | Admitting: Internal Medicine

## 2021-12-31 ENCOUNTER — Ambulatory Visit: Payer: PPO | Admitting: Nurse Practitioner

## 2021-12-31 MED ORDER — METFORMIN HCL 1000 MG PO TABS: 1000.0000 mg | ORAL_TABLET | Freq: Two times a day (BID) | ORAL | 0 refills | Status: DC

## 2022-01-03 DIAGNOSIS — E1140 Type 2 diabetes mellitus with diabetic neuropathy, unspecified: Secondary | ICD-10-CM | POA: Diagnosis not present

## 2022-01-04 ENCOUNTER — Telehealth: Payer: Self-pay | Admitting: Internal Medicine

## 2022-01-04 NOTE — Telephone Encounter (Signed)
I left a brief message with Dr. Annamaria Boots recommendations per [facility] and she asked to call back to verify the pharmacy. Waiting on a call back.

## 2022-01-04 NOTE — Telephone Encounter (Signed)
I called the patient and left a message for her to call back for the provider recommendations. Waiting on a call back.

## 2022-01-04 NOTE — Telephone Encounter (Signed)
Suggest- Zpak 250 mg, # 6, 2 today then one daily      for bronchitis  Symbicort 160   # 1, ref x 6,    inhale 2 puffs then rinse mouth, twice daily      instead of Breztri  Try Delsym otc cough syrup and Zero Sugar Marolyn Hammock Rancher hard candies as throat lozenges.

## 2022-01-04 NOTE — Telephone Encounter (Signed)
I called the patient and she is having some cough that is productive, she is having to clear her throat and its making her head hurt, she is having some redness in her eye, and it has been going on for a few weeks. She has been using Ginger candy and has taken over the counter allergy medications. She has some hoarseness during the daytime. She has not had any chills, fever, or body aches. Please advise.

## 2022-01-04 NOTE — Telephone Encounter (Signed)
Patient is returning phone call. Patient phone number is [phone].

## 2022-01-04 NOTE — Telephone Encounter (Signed)
LOV 6/16. Pt has been taking breztri since then and she believes she's having a reaction. She is coughing up "thick, yellow stuff" and has a sore throat. Constantly clearing her throat and has a headache. Would like to go back on symbicort if possible. Also taking tesslon pearls but doesn't believe they are working. [facility] [address].

## 2022-01-05 MED ORDER — BUDESONIDE-FORMOTEROL FUMARATE 160-4.5 MCG/ACT IN AERO: 2.0000 | INHALATION_SPRAY | Freq: Two times a day (BID) | RESPIRATORY_TRACT | 6 refills | Status: DC

## 2022-01-05 MED ORDER — AZITHROMYCIN 250 MG PO TABS: ORAL_TABLET | ORAL | 0 refills | Status: DC

## 2022-01-05 NOTE — Telephone Encounter (Signed)
Spoke with pt and notified her medications would be called into verified pharmacy. Pt stated understanding. Nothing further needed at this time

## 2022-01-12 DIAGNOSIS — G4733 Obstructive sleep apnea (adult) (pediatric): Secondary | ICD-10-CM | POA: Diagnosis not present

## 2022-01-13 ENCOUNTER — Other Ambulatory Visit: Payer: Self-pay | Admitting: Gastroenterology

## 2022-01-13 ENCOUNTER — Other Ambulatory Visit (HOSPITAL_COMMUNITY): Payer: Self-pay

## 2022-01-13 DIAGNOSIS — R1013 Epigastric pain: Secondary | ICD-10-CM

## 2022-01-13 DIAGNOSIS — K2970 Gastritis, unspecified, without bleeding: Secondary | ICD-10-CM

## 2022-01-14 ENCOUNTER — Other Ambulatory Visit (HOSPITAL_COMMUNITY): Payer: Self-pay

## 2022-01-14 ENCOUNTER — Ambulatory Visit: Payer: PPO | Admitting: Podiatry

## 2022-01-14 DIAGNOSIS — M19079 Primary osteoarthritis, unspecified ankle and foot: Secondary | ICD-10-CM | POA: Diagnosis not present

## 2022-01-14 DIAGNOSIS — M7752 Other enthesopathy of left foot: Secondary | ICD-10-CM | POA: Diagnosis not present

## 2022-01-14 MED ORDER — DICYCLOMINE HCL 10 MG PO CAPS
ORAL_CAPSULE | ORAL | 3 refills | Status: DC
  Filled 2022-01-14: qty 30, 10d supply, fill #0

## 2022-01-14 MED ORDER — DICLOFENAC SODIUM 1 % EX GEL: 2.0000 g | Freq: Four times a day (QID) | CUTANEOUS | 2 refills | Status: AC

## 2022-01-14 NOTE — Progress Notes (Signed)
Subjective: Chief Complaint  Patient presents with   Foot Pain    Pt came in to for bilateral foot and ankle pain, pt is feeling worse than last visit, pt is having pain on the top of the foot and ankle, pt rates the pain 8 out of 67     61 year old female presents the office today with above complaints.  She points on the anterior aspect the ankle where she gets discomfort also the anterior aspect ankle just above the ankle joint itself.  She is attending this in the toes.  No numbness or tingling.  Right side is worse than left.  She states the injection that she had previously was helpful.  Last A1c was 6.7 on August 16, 2021  Objective: AAO x3, NAD DP/PT pulses palpable bilaterally, CRT less than 3 seconds Subjective she points on the anterior aspect ankle bilaterally but not able to appreciate any tenderness on exam today.  Small knot is present just proximal to the anterior ankle joint on the right side.  The joint tenderness is localized along the sinus tarsi bilaterally.  Flexor, extensor tendons appear to be intact.  MMT 5/5. No pain with calf compression, swelling, warmth, erythema  Assessment: 61 year old female with capsulitis chronic ankle pain right side worse than left  Plan: -All treatment options discussed with the patient including all alternatives, risks, complications.  -At this point given ongoing pain despite conservative care recommend MRIs and MRI of the left and right ankle were ordered. -Tri-Lock ankle brace dispensed for the right side today -Continue Voltaren gel, icing as well as supportive shoe gear. -Patient encouraged to call the office with any questions, concerns, change in symptoms.   Trula Slade DPM

## 2022-01-14 NOTE — Patient Instructions (Addendum)
For instructions on how to put on your Tri-Lock Ankle Brace, please visit [email or web address]  For shoes I like [employer], New Balance, Brooks.

## 2022-01-17 ENCOUNTER — Ambulatory Visit: Payer: PPO | Admitting: Physician Assistant

## 2022-01-18 ENCOUNTER — Ambulatory Visit: Payer: PPO | Admitting: Gastroenterology

## 2022-01-18 ENCOUNTER — Other Ambulatory Visit: Payer: Self-pay | Admitting: Internal Medicine

## 2022-01-19 MED ORDER — METFORMIN HCL 1000 MG PO TABS: 1000.0000 mg | ORAL_TABLET | Freq: Two times a day (BID) | ORAL | 0 refills | Status: DC

## 2022-01-21 ENCOUNTER — Other Ambulatory Visit: Payer: Self-pay | Admitting: Internal Medicine

## 2022-01-21 ENCOUNTER — Other Ambulatory Visit: Payer: Self-pay | Admitting: Adult Health

## 2022-01-21 DIAGNOSIS — A6004 Herpesviral vulvovaginitis: Secondary | ICD-10-CM

## 2022-01-21 DIAGNOSIS — B370 Candidal stomatitis: Secondary | ICD-10-CM

## 2022-01-23 ENCOUNTER — Ambulatory Visit
Admission: RE | Admit: 2022-01-23 | Discharge: 2022-01-23 | Disposition: A | Discharge status: Home / Self Care | Payer: PPO | Source: Ambulatory Visit | Attending: Podiatry | Admitting: Podiatry

## 2022-01-23 DIAGNOSIS — M19079 Primary osteoarthritis, unspecified ankle and foot: Secondary | ICD-10-CM

## 2022-01-23 DIAGNOSIS — M24272 Disorder of ligament, left ankle: Secondary | ICD-10-CM | POA: Diagnosis not present

## 2022-01-23 DIAGNOSIS — M24271 Disorder of ligament, right ankle: Secondary | ICD-10-CM | POA: Diagnosis not present

## 2022-01-24 MED ORDER — OMNIPOD DASH INTRO (GEN 4) KIT: 1.0000 | PACK | 0 refills | Status: DC

## 2022-01-25 ENCOUNTER — Other Ambulatory Visit: Payer: Self-pay | Admitting: Internal Medicine

## 2022-01-25 ENCOUNTER — Other Ambulatory Visit: Payer: Self-pay | Admitting: *Deleted

## 2022-01-25 NOTE — Patient Outreach (Signed)
  Care Coordination   01/25/2022 Name: [name] MRN: [id number] DOB: Jun 26, 1960   Care Coordination Outreach Attempts:  An unsuccessful telephone outreach was attempted today to offer the patient information about available care coordination services as a benefit of their health plan.   Follow Up Plan:  Additional outreach attempts will be made to offer the patient care coordination information and services.   Encounter Outcome:  No Answer  Care Coordination Interventions Activated:  No   Care Coordination Interventions:  No, not indicated    Thomasina Housley C. Myrtie Neither, MSN, [facility] Gerontological Nurse Practitioner [facility] Care Management [phone]

## 2022-01-27 ENCOUNTER — Encounter: Payer: Self-pay | Admitting: Gastroenterology

## 2022-02-03 DIAGNOSIS — E1140 Type 2 diabetes mellitus with diabetic neuropathy, unspecified: Secondary | ICD-10-CM | POA: Diagnosis not present

## 2022-02-04 ENCOUNTER — Telehealth: Payer: Self-pay | Admitting: Physician Assistant

## 2022-02-04 ENCOUNTER — Telehealth: Payer: Self-pay | Admitting: Nurse Practitioner

## 2022-02-04 ENCOUNTER — Ambulatory Visit: Payer: PPO | Admitting: Physician Assistant

## 2022-02-04 ENCOUNTER — Other Ambulatory Visit (INDEPENDENT_AMBULATORY_CARE_PROVIDER_SITE_OTHER): Payer: PPO

## 2022-02-04 ENCOUNTER — Encounter: Payer: Self-pay | Admitting: Physician Assistant

## 2022-02-04 ENCOUNTER — Telehealth: Payer: Self-pay | Admitting: Gastroenterology

## 2022-02-04 VITALS — BP 110/60 | HR 75 | Ht 64.0 in | Wt 245.0 lb

## 2022-02-04 DIAGNOSIS — Z8719 Personal history of other diseases of the digestive system: Secondary | ICD-10-CM | POA: Diagnosis not present

## 2022-02-04 DIAGNOSIS — Z86010 Personal history of colonic polyps: Secondary | ICD-10-CM | POA: Diagnosis not present

## 2022-02-04 DIAGNOSIS — R1013 Epigastric pain: Secondary | ICD-10-CM

## 2022-02-04 LAB — CBC WITH DIFFERENTIAL/PLATELET
Basophils Absolute: 0.1 10*3/uL (ref 0.0–0.1)
Basophils Relative: 1.6 % (ref 0.0–3.0)
Eosinophils Absolute: 0.2 10*3/uL (ref 0.0–0.7)
Eosinophils Relative: 3.9 % (ref 0.0–5.0)
HCT: 41.6 % (ref 36.0–46.0)
Hemoglobin: 13.6 g/dL (ref 12.0–15.0)
Lymphocytes Relative: 34.1 % (ref 12.0–46.0)
Lymphs Abs: 1.6 10*3/uL (ref 0.7–4.0)
MCHC: 32.7 g/dL (ref 30.0–36.0)
MCV: 82.1 fl (ref 78.0–100.0)
Monocytes Absolute: 0.4 10*3/uL (ref 0.1–1.0)
Monocytes Relative: 8.2 % (ref 3.0–12.0)
Neutro Abs: 2.5 10*3/uL (ref 1.4–7.7)
Neutrophils Relative %: 52.2 % (ref 43.0–77.0)
Platelets: 209 10*3/uL (ref 150.0–400.0)
RBC: 5.07 Mil/uL (ref 3.87–5.11)
RDW: 15.2 % (ref 11.5–15.5)
WBC: 4.7 10*3/uL (ref 4.0–10.5)

## 2022-02-04 LAB — COMPREHENSIVE METABOLIC PANEL
ALT: 23 U/L (ref 0–35)
AST: 23 U/L (ref 0–37)
Albumin: 4.3 g/dL (ref 3.5–5.2)
Alkaline Phosphatase: 61 U/L (ref 39–117)
BUN: 10 mg/dL (ref 6–23)
CO2: 34 mEq/L — ABNORMAL HIGH (ref 19–32)
Calcium: 9.9 mg/dL (ref 8.4–10.5)
Chloride: 96 mEq/L (ref 96–112)
Creatinine, Ser: 0.72 mg/dL (ref 0.40–1.20)
GFR: 90.2 mL/min (ref >60.00)
Glucose, Bld: 152 mg/dL — ABNORMAL HIGH (ref 70–99)
Potassium: 3.3 mEq/L — ABNORMAL LOW (ref 3.5–5.1)
Sodium: 141 mEq/L (ref 135–145)
Total Bilirubin: 0.3 mg/dL (ref 0.2–1.2)
Total Protein: 7.6 g/dL (ref 6.0–8.3)

## 2022-02-04 LAB — LIPASE: Lipase: 58 U/L (ref 11.0–59.0)

## 2022-02-04 MED ORDER — ESOMEPRAZOLE MAGNESIUM 40 MG PO CPDR: 40.0000 mg | DELAYED_RELEASE_CAPSULE | Freq: Two times a day (BID) | ORAL | 11 refills | Status: DC

## 2022-02-04 MED ORDER — FAMOTIDINE 20 MG PO TABS: 20.0000 mg | ORAL_TABLET | Freq: Two times a day (BID) | ORAL | 2 refills | Status: DC | PRN

## 2022-02-04 MED ORDER — AMBULATORY NON FORMULARY MEDICATION: 0 refills | Status: DC

## 2022-02-04 MED ORDER — DICYCLOMINE HCL 10 MG PO CAPS: ORAL_CAPSULE | ORAL | 11 refills | Status: DC

## 2022-02-04 MED ORDER — DICYCLOMINE HCL 10 MG PO CAPS: ORAL_CAPSULE | ORAL | 3 refills | Status: DC

## 2022-02-04 MED ORDER — AMBULATORY NON FORMULARY MEDICATION: 30.0000 mL | 0 refills | Status: DC | PRN

## 2022-02-04 MED ORDER — ESOMEPRAZOLE MAGNESIUM 40 MG PO CPDR: 40.0000 mg | DELAYED_RELEASE_CAPSULE | Freq: Two times a day (BID) | ORAL | 3 refills | Status: DC

## 2022-02-04 MED ORDER — FAMOTIDINE 20 MG PO TABS: 20.0000 mg | ORAL_TABLET | Freq: Two times a day (BID) | ORAL | 11 refills | Status: AC | PRN

## 2022-02-04 NOTE — Telephone Encounter (Signed)
Left voicemail for patient that due to back order I would fax prescription to [facility] to see if they may be able to mix her GI cocktail.

## 2022-02-04 NOTE — Patient Instructions (Signed)
If you are age 61 or younger, your body mass index should be between 19-25. Your Body mass index is 42.05 kg/m. If this is out of the aformentioned range listed, please consider follow up with your Primary Care Provider.  ________________________________________________________  The [facility] providers would like to encourage you to use [facility] to communicate with providers for non-urgent requests or questions.  Due to long hold times on the telephone, sending your provider a message by [facility] may be a faster and more efficient way to get a response.  Please allow 48 business hours for a response.  Please remember that this is for non-urgent requests.  _______________________________________________________  [name] have been scheduled for an abdominal ultrasound at [facility] (1st floor of hospital) on 02/11/2022 at 9:30 am. Please arrive 15 minutes prior to your appointment for registration. Make certain not to have anything to eat or drink 6 hours prior to your appointment. Should you need to reschedule your appointment, please contact radiology at [phone]. This test typically takes about 30 minutes to perform.  Your provider has requested that you go to the basement level for lab work before leaving today. Press "B" on the elevator. The lab is located at the first door on the left as you exit the elevator.  We have sent refills of Nexium, Famotidine and Dicyclomine  We will be send a GI cocktail to your pharmacy  The next plans of your treatment/work up pending the results of your Ultra Sound.  Thank you for entrusting me with your care and choosing [facility].  Amy Esterwood,. PA-C

## 2022-02-04 NOTE — Telephone Encounter (Signed)
[facility] called regarding GI cocktail that was called in today, states lidocaine is on back order so the prescription order will have to be send elsewhere. Please call to advise

## 2022-02-04 NOTE — Telephone Encounter (Signed)
Error

## 2022-02-04 NOTE — Progress Notes (Signed)
Subjective:    Patient ID: [name], female    DOB: [birth year], 61 y.o.   MRN: [id number]  HPI [name] is a pleasant 61 year old female, established with Dr. Fuller Plan.  She was last seen in the office in November 2022 that time with complaints of GERD and chronic intermittent epigastric pain.  She comes back in today stating that she has been having symptoms over the past 3 weeks fairly consistently with upper abdominal/epigastric discomfort without nausea and vomiting.  She sometimes notices this is worse after eating and may not occur immediately after a meal but onset within 1 to 2 hours of eating and may last for several hours.  Other times the pain will occur randomly and not be related to meals she describes it as a nagging aggravating pain without radiation and usually will lay down and curl up with a pillow on her abdomen to wait for the pain to subside.  She said she had a particularly bad episode about 3 weeks ago and was tender for several days afterwards in the upper abdomen.  No associated fever or chills, no diarrhea melena or hematochezia.  She is not using any regular aspirin or NSAIDs.  She did start on Femara for breast cancer a few months ago but no other new medications. She has continued on Nexium 40 mg p.o. twice daily, Pepcid 20 mg every morning and takes Bentyl regularly 2-3 times daily. She has undergone prior endoscopies the last was done in July 2021 with finding of diffuse moderate gastritis with erythema and friability, biopsies showed mild chronic inactive gastritis no H. pylori.  Last colonoscopy 2018 with removal of 6 polyps 3 of which were 6 to 7 mm in size.  Path showed 1 tubular adenoma and the remainder of the polyps were benign lymphoid aggregate or benign colonic rectal mucosa.  She was indicated for 5-year interval follow-up  She did undergo upper abdominal ultrasound in 2021 which did not show any evidence of gallstones, did show hepatic steatosis, and then had a  HIDA scan in August 2021 that showed delayed visualization of the gallbladder, after morphine.  Review of Systems Pertinent positive and negative review of systems were noted in the above HPI section.  All other review of systems was otherwise negative.   Outpatient Encounter Medications as of 02/04/2022  Medication Sig   acyclovir (ZOVIRAX) 400 MG tablet TAKE 1 TABLET BY MOUTH THREE TIMES DAILY AS NEEDED (FOR  FLARES)   albuterol (VENTOLIN HFA) 108 (90 Base) MCG/ACT inhaler Inhale 2 puffs into the lungs every 6 (six) hours as needed for wheezing or shortness of breath.   BELBUCA 300 MCG FILM Take 1 strip by mouth 2 (two) times daily.   Blood Glucose Monitoring Suppl (ONETOUCH VERIO FLEX SYSTEM) w/Device KIT 1 each by Does not apply route 3 (three) times daily.   budesonide-formoterol (SYMBICORT) 160-4.5 MCG/ACT inhaler Inhale 2 puffs into the lungs 2 (two) times daily.   chlorpheniramine (CHLOR-TRIMETON) 4 MG tablet Take 8 mg by mouth in the morning and at bedtime.   Continuous Blood Gluc Sensor (FREESTYLE LIBRE 2 SENSOR) MISC Change sensor every 14 days   Continuous Glucose Monitor DEVI Use as directed.   dapagliflozin propanediol (FARXIGA) 10 MG TABS tablet Take 1 tablet (10 mg total) by mouth daily.   diclofenac Sodium (VOLTAREN) 1 % GEL Apply 2 g topically 4 (four) times daily. Rub into affected area of foot 2 to 4 times daily   ezetimibe (ZETIA) 10 MG  tablet Take 1 tablet (10 mg total) by mouth daily.   fluconazole (DIFLUCAN) 150 MG tablet Take 1 tablet (150 mg total) by mouth daily.   gabapentin (NEURONTIN) 600 MG tablet Take 1 tablet (600 mg total) by mouth 2 (two) times daily.   glucose blood (FREESTYLE TEST STRIPS) test strip 1 each by Other route 3 (three) times daily.   hydrochlorothiazide (HYDRODIURIL) 25 MG tablet Take 1 tablet (25 mg total) by mouth daily.   insulin aspart (NOVOLOG) 100 UNIT/ML injection Max daily 120 units daily   Insulin Disposable Pump (OMNIPOD DASH INTRO, GEN  4,) KIT 1 Device by Does not apply route every 3 (three) days.   Insulin Syringe-Needle U-100 (INSULIN SYRINGE 1CC/30GX1/2") 30G X 1/2" 1 ML MISC Use to fill Vgo daily   letrozole (FEMARA) 2.5 MG tablet Take 1 tablet (2.5 mg total) by mouth daily.   metFORMIN (GLUCOPHAGE) 1000 MG tablet Take 1 tablet (1,000 mg total) by mouth 2 (two) times daily with a meal.   montelukast (SINGULAIR) 10 MG tablet Take 1 tablet (10 mg total) by mouth at bedtime.   Multiple Vitamin (MULTIVITAMIN WITH MINERALS) TABS tablet Take 1 tablet by mouth daily.   NARCAN 4 MG/0.1ML LIQD nasal spray kit Place 0.4 mg into the nose once.   ONETOUCH DELICA LANCETS FINE MISC Check blood sugar 3 times a day   oxyCODONE-acetaminophen (PERCOCET) 10-325 MG tablet Take 1 tablet by mouth 4 (four) times daily as needed for pain.   rosuvastatin (CRESTOR) 10 MG tablet Take 1 tablet (10 mg total) by mouth daily.   Semaglutide, 2 MG/DOSE, (OZEMPIC, 2 MG/DOSE,) 8 MG/3ML SOPN Inject 2 mg into the skin once a week.   tiZANidine (ZANAFLEX) 4 MG capsule Take 4 mg by mouth 3 (three) times daily as needed.   valACYclovir (VALTREX) 500 MG tablet TAKE 1 TABLET BY MOUTH TWICE DAILY FOR 5 DAYS AT  TIME  OF  OUTBREAK   [DISCONTINUED] AMBULATORY NON FORMULARY MEDICATION Medication Name: GI Cocktail: 270 mLViscous Xylocain 2%, 270 mL Dicyclomione10 mg/5 mL, 810 mL Mylanta or Maylox,   [DISCONTINUED] dicyclomine (BENTYL) 10 MG capsule TAKE 1 CAPSULE BY MOUTH THREE TIMES DAILY BEFORE MEAL(S)   [DISCONTINUED] esomeprazole (NEXIUM) 40 MG capsule Take 1 capsule (40 mg total) by mouth 2 (two) times daily before a meal. Take 30 minutes before breakfast and dinner.   [DISCONTINUED] famotidine (PEPCID) 20 MG tablet Take 1 tablet (20 mg total) by mouth 2 (two) times daily as needed.   [DISCONTINUED] irbesartan (AVAPRO) 300 MG tablet Take 1 tablet (300 mg total) by mouth daily.   AMBULATORY NON FORMULARY MEDICATION Take 30 mLs by mouth as needed (Severe abdominal  pain). Medication Name: GI Cocktail: 270 mLViscous Xylocain 2%, 270 mL Dicyclomione10 mg/5 mL, 810 mL Mylanta or Maylox,   dicyclomine (BENTYL) 10 MG capsule TAKE 1 CAPSULE BY MOUTH THREE TIMES DAILY BEFORE MEAL(S)   esomeprazole (NEXIUM) 40 MG capsule Take 1 capsule (40 mg total) by mouth 2 (two) times daily before a meal. Take 30 minutes before breakfast and dinner.   famotidine (PEPCID) 20 MG tablet Take 1 tablet (20 mg total) by mouth 2 (two) times daily as needed.   [DISCONTINUED] azithromycin (ZITHROMAX Z-PAK) 250 MG tablet Take 2 tabs today, then 1 tab until gone   [DISCONTINUED] benzonatate (TESSALON) 200 MG capsule Take 1 capsule (200 mg total) by mouth 3 (three) times daily as needed for cough.   [DISCONTINUED] Budeson-Glycopyrrol-Formoterol (BREZTRI AEROSPHERE) 160-9-4.8 MCG/ACT AERO Inhale 2 puffs  into the lungs 2 (two) times daily.   [DISCONTINUED] dicyclomine (BENTYL) 10 MG capsule TAKE 1 CAPSULE BY MOUTH THREE TIMES DAILY BEFORE MEAL(S)   [DISCONTINUED] esomeprazole (NEXIUM) 40 MG capsule Take 1 capsule (40 mg total) by mouth 2 (two) times daily before a meal. Take 30 minutes before breakfast and dinner.   [DISCONTINUED] famotidine (PEPCID) 20 MG tablet Take 1 tablet (20 mg total) by mouth 2 (two) times daily as needed.   No facility-administered encounter medications on file as of 02/04/2022.   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 05/07/2019   OSA on CPAP 10/31/2018   Stress  incontinence in female 03/09/2017   Hepatic steatosis 08/29/2016   Herpes simplex 03/08/2016   Diabetic neuropathy with neurologic complication ([place]) 74/94/4967   Long term current use of opiate analgesic 07/21/2015   Preventative health care 05/21/2015   COPD GOLD II  05/31/2013   Lung nodule < 6cm on CT 05/31/2013   Chronic cough 12/07/2012   Chronic pain syndrome 02/13/2012   Lumbar radiculopathy 11/29/2011   Type 2 diabetes mellitus with diabetic neuropathy ([place]) 08/25/2011   Back pain 11/15/2010   Hypertension associated with diabetes ([place]) 07/07/2009   GERD 01/08/2009   Dyslipidemia associated with type 2 diabetes mellitus ([place]) 12/09/2008   Hyperlipidemia 12/09/2008   Morbid obesity due to excess calories ([place]) 12/09/2008   Social History   Socioeconomic History   Marital status: Married    Spouse name: Not on file   Number of children: 3   Years of education: Not on file   Highest education level: Not on file  Occupational History    Comment: [employer]  Tobacco Use   Smoking status: Former    Packs/day: 0.50    Years: 20.00    Total pack years: 10.00    Types: Cigarettes    Quit date: 07/23/1996    Years since quitting: 25.5   Smokeless tobacco: Never  Vaping Use   Vaping Use: Never used  Substance and Sexual Activity   Alcohol use: No    Alcohol/week: 0.0 standard drinks of alcohol    Comment: recovering addict clean for 9 years   Drug use: No    Types: Cocaine, Marijuana    Comment: recovering addict clean for 9 years   Sexual activity: Yes    Birth control/protection: Surgical  Other Topics Concern   Not on file  Social History Narrative   Current Social History 01/17/2020        Patient lives with spouse in a home which is 1 story. There are not steps up to the entrance the patient uses.       Patient's method of transportation is personal car.      The highest level of education was some college.      The patient currently works  part-time.      Identified important Relationships are God, husband, kids, grandkids       Pets : None       Interests / Fun:  Drawing and nature       Current Stressors: Pain, Covid       Religious / Personal Beliefs: Christian       Other: None    Social Determinants of Health   Financial Resource Strain: Low Risk  (02/13/2021)   Overall Financial Resource Strain (CARDIA)    Difficulty of Paying Living Expenses: Not hard at all  Food Insecurity: No Food Insecurity (02/13/2021)   Hunger Vital Sign    Worried About Running Out of Food in the Last Year: Never true    [place] in the Last Year: Never true  Transportation Needs: No Transportation Needs (02/13/2021)   PRAPARE - Hydrologist (Medical): No    Lack of Transportation (Non-Medical): No  Physical Activity: Insufficiently Active (02/13/2021)   Exercise Vital Sign    Days of Exercise per Week: 3 days    Minutes of Exercise per Session: 10 min  Stress: No Stress Concern Present (02/13/2021)   [place]    Feeling of Stress : Not at all  Social Connections: Moderately Integrated (02/13/2021)   Social Connection and Isolation Panel [NHANES]    Frequency of Communication with Friends and Family: More than three times a week    Frequency of Social Gatherings with Friends and Family: Once a week    Attends Religious Services: More than 4 times per year    Active Member of [employer] or Organizations: No    Attends Archivist Meetings: Never    Marital Status: Married  Human resources officer Violence: Not At Risk (02/13/2021)   Humiliation, Afraid, Rape, and Kick questionnaire    Fear of Current or Ex-Partner: No    Emotionally Abused: No    Physically Abused: No    Sexually Abused: No    Ms. Broman family history includes Cancer in her mother; Diabetes in her father, mother, sister, sister, and sister; Heart disease in her father;  Hypertension in her father, mother, sister, and sister; Kidney cancer in her sister; Kidney disease in her sister; Obesity in her son; Stroke in her sister.      Objective:    Vitals:   02/04/22 0927  BP: 110/60  Pulse: 75    Physical Exam Well-developed well-nourished AA female  in no acute distress.  Height, Weight,245  BMI 42  HEENT; nontraumatic normocephalic, EOMI, PE R LA, sclera anicteric. Oropharynx; not examined today Neck; supple, no JVD Cardiovascular; regular rate and rhythm with S1-S2, no murmur rub or gallop Pulmonary; Clear bilaterally Abdomen; soft, there is some tenderness in the right upper quadrant and epigastrium ,no guarding ondistended, no palpable mass or hepatosplenomegaly, bowel sounds are active Rectal; not done today Skin; benign exam, no jaundice rash or appreciable lesions Extremities; no clubbing cyanosis or edema skin warm and dry Neuro/Psych; alert and oriented x4, grossly nonfocal mood and affect appropriate        Assessment & Plan:   #2 61 year old female with prior history of intermittent epigastric pain history of chronic gastritis for which she has been on twice daily PPI.  She presents now with 3-week history of what she describes as episodes of sometimes severe epigastric pain without radiation which may occur 1 to 2 hours after eating and last for several hours.  Etiology of current symptoms is not entirely clear, some concern for biliary colic despite negative ultrasound 2021  She does have history of adult onset diabetes mellitus  and diabetic neuropathy, consider diabetic visceral neuropathy though the episodic nature is less typical.  #2 history of adenomatous colon polyp-last colonoscopy 2018-indication for 5-year interval follow-up  #3 history of breast cancer stage Ia #4 COPD no oxygen use 5.  Sleep apnea 6.  Hypertension 7.  Hepatic steatosis  8.  Adult onset diabetes mellitus, insulin-dependent with history of diabetic  neuropathy  Plan; continue Nexium 40 mg p.o. twice daily, continue Bentyl 10 mg twice daily to 3 times daily Continue Pepcid 20 mg every morning CBC with differential, lipase and c-Met today Schedule for upper abdominal ultrasound, if this is negative and symptoms are persisting, will consider CCK HIDA scan Patient asked for GI cocktail which had been helpful for some of her symptoms in the past, this was refilled 30 cc every 6 hours as needed Once her more acute symptoms are sorted out and hopefully improved then will need follow-up colonoscopy scheduled with Dr. Fuller Plan.  Keniya Schlotterbeck Genia Harold PA-C 02/04/2022   Cc: [place], [place]

## 2022-02-07 ENCOUNTER — Ambulatory Visit
Admission: RE | Admit: 2022-02-07 | Discharge: 2022-02-07 | Disposition: A | Discharge status: Home / Self Care | Payer: PPO | Source: Ambulatory Visit | Attending: Adult Health | Admitting: Adult Health

## 2022-02-07 DIAGNOSIS — E119 Type 2 diabetes mellitus without complications: Secondary | ICD-10-CM | POA: Diagnosis not present

## 2022-02-07 DIAGNOSIS — Z853 Personal history of malignant neoplasm of breast: Secondary | ICD-10-CM | POA: Diagnosis not present

## 2022-02-07 DIAGNOSIS — M545 Low back pain, unspecified: Secondary | ICD-10-CM | POA: Diagnosis not present

## 2022-02-07 DIAGNOSIS — M5136 Other intervertebral disc degeneration, lumbar region: Secondary | ICD-10-CM | POA: Diagnosis not present

## 2022-02-07 DIAGNOSIS — Z79899 Other long term (current) drug therapy: Secondary | ICD-10-CM | POA: Diagnosis not present

## 2022-02-07 DIAGNOSIS — C50411 Malignant neoplasm of upper-outer quadrant of right female breast: Secondary | ICD-10-CM

## 2022-02-07 DIAGNOSIS — R928 Other abnormal and inconclusive findings on diagnostic imaging of breast: Secondary | ICD-10-CM | POA: Diagnosis not present

## 2022-02-07 DIAGNOSIS — M542 Cervicalgia: Secondary | ICD-10-CM | POA: Diagnosis not present

## 2022-02-07 DIAGNOSIS — M25552 Pain in left hip: Secondary | ICD-10-CM | POA: Diagnosis not present

## 2022-02-07 DIAGNOSIS — I10 Essential (primary) hypertension: Secondary | ICD-10-CM | POA: Diagnosis not present

## 2022-02-07 DIAGNOSIS — M546 Pain in thoracic spine: Secondary | ICD-10-CM | POA: Diagnosis not present

## 2022-02-07 DIAGNOSIS — E559 Vitamin D deficiency, unspecified: Secondary | ICD-10-CM | POA: Diagnosis not present

## 2022-02-07 HISTORY — DX: Personal history of irradiation: Z92.3

## 2022-02-10 ENCOUNTER — Encounter: Payer: Self-pay | Admitting: *Deleted

## 2022-02-10 ENCOUNTER — Other Ambulatory Visit: Payer: Self-pay | Admitting: *Deleted

## 2022-02-10 DIAGNOSIS — Z79899 Other long term (current) drug therapy: Secondary | ICD-10-CM | POA: Diagnosis not present

## 2022-02-10 NOTE — Patient Outreach (Addendum)
  Care Coordination   Initial Visit Note   02/10/2022 Name: LAKAYA TOLEN MRN: [id number] DOB: 1960-08-23  SUSANN LAWHORNE is a 61 y.o. year old female who sees Sagardia, Ines Bloomer, MD for primary care. I spoke with  [name] by phone today.  What matters to the patients health and wellness today?  Improving my diabetes self management to achieve HgbA1C of <7.0 and to lose weight.    Goals Addressed               This Visit's Progress     Patient Stated     Improve my diet by carb counting to help manage my DM and lose weight. (pt-stated)        Care Coordination Interventions: Assessed pt current diet Assessed knowledge of carb counting - not really knowledgeable. Sending carb counting information Request that pt keep a food diary so we can work together for her to get an understanding of good dietary management.           SDOH assessments and interventions completed:  Yes  SDOH Interventions Today    Flowsheet Row Most Recent Value  SDOH Interventions   Food Insecurity Interventions Intervention Not Indicated  Transportation Interventions Intervention Not Indicated        Care Coordination Interventions Activated:  Yes  Care Coordination Interventions:  Yes, provided   PT HAS HCPOA BUT NOT IN CHART REQUESTED HER TO PROVIDE A COPY TO HER MD.  Follow up plan: Follow up call scheduled for 1 week.    Encounter Outcome:  Pt. Visit Completed   Kayleen Memos C. Myrtie Neither, MSN, [facility] Gerontological Nurse Practitioner [facility] Care Management [phone]

## 2022-02-11 ENCOUNTER — Other Ambulatory Visit: Payer: Self-pay | Admitting: *Deleted

## 2022-02-11 ENCOUNTER — Encounter: Payer: Self-pay | Admitting: *Deleted

## 2022-02-11 ENCOUNTER — Ambulatory Visit (HOSPITAL_COMMUNITY)
Admission: RE | Admit: 2022-02-11 | Discharge: 2022-02-11 | Disposition: A | Discharge status: Home / Self Care | Payer: PPO | Source: Ambulatory Visit | Attending: Physician Assistant | Admitting: Physician Assistant

## 2022-02-11 ENCOUNTER — Telehealth: Payer: Self-pay | Admitting: Hematology and Oncology

## 2022-02-11 DIAGNOSIS — Z8719 Personal history of other diseases of the digestive system: Secondary | ICD-10-CM | POA: Insufficient documentation

## 2022-02-11 DIAGNOSIS — Z79899 Other long term (current) drug therapy: Secondary | ICD-10-CM

## 2022-02-11 DIAGNOSIS — N281 Cyst of kidney, acquired: Secondary | ICD-10-CM | POA: Diagnosis not present

## 2022-02-11 DIAGNOSIS — R1013 Epigastric pain: Secondary | ICD-10-CM | POA: Insufficient documentation

## 2022-02-11 DIAGNOSIS — K7689 Other specified diseases of liver: Secondary | ICD-10-CM | POA: Diagnosis not present

## 2022-02-11 NOTE — Telephone Encounter (Signed)
Rescheduled appointment per provider PAL. Patient is aware of the changes made to her upcoming appointment. 

## 2022-02-15 ENCOUNTER — Other Ambulatory Visit: Payer: Self-pay

## 2022-02-15 MED ORDER — POTASSIUM CHLORIDE CRYS ER 20 MEQ PO TBCR: 20.0000 meq | EXTENDED_RELEASE_TABLET | Freq: Two times a day (BID) | ORAL | 0 refills | Status: DC

## 2022-02-16 DIAGNOSIS — G4733 Obstructive sleep apnea (adult) (pediatric): Secondary | ICD-10-CM | POA: Diagnosis not present

## 2022-02-17 ENCOUNTER — Telehealth: Payer: Self-pay | Admitting: *Deleted

## 2022-02-17 ENCOUNTER — Encounter: Payer: Self-pay | Admitting: *Deleted

## 2022-02-17 NOTE — Patient Outreach (Signed)
  Care Coordination   Follow Up Visit Note   02/17/2022 Name: [name] MRN: [id number] DOB: 07/28/1960  [name] is a 61 y.o. year old female who sees Sagardia, Ines Bloomer, MD for primary care. I spoke with  [name] by phone today.  What matters to the patients health and wellness today?  Continuing her education on carb counting and reduction.    Goals Addressed               This Visit's Progress     Patient Stated     Improve my diet by carb counting to help manage my DM and lose weight. (pt-stated)        Care Coordination Interventions: Pt has received the DM information sent and reviewed. She has some questions but the materials are not with her at this time. She did not receive the calendar. Pt disclosed some of her diet choice areas to work on: SNACKS  We discussed what is an appropriate snack = 15 Gms of carbs and examples of some. Encouraged pt to look over materials, highlight and make notes of anything she needs clarification for. Request that she write down everything she eats for 3 days and try to figure out her carb intake and if she has difficulty we will work through it together on our next call. Reinforced that this is a learning curve and that these exercises are practical and that she can master this and meet her goal!          SDOH assessments and interventions completed:  No     Care Coordination Interventions Activated:  Yes  Care Coordination Interventions:  Yes, provided   Follow up plan: Follow up call scheduled for 2 weeks.    Encounter Outcome:  Pt. Visit Completed   Kayleen Memos C. Myrtie Neither, MSN, [facility] Gerontological Nurse Practitioner [facility] Care Management [phone]

## 2022-02-18 ENCOUNTER — Ambulatory Visit (INDEPENDENT_AMBULATORY_CARE_PROVIDER_SITE_OTHER): Payer: PPO

## 2022-02-18 VITALS — Ht 64.0 in | Wt 244.0 lb

## 2022-02-18 DIAGNOSIS — Z0000 Encounter for general adult medical examination without abnormal findings: Secondary | ICD-10-CM | POA: Diagnosis not present

## 2022-02-18 DIAGNOSIS — Z1211 Encounter for screening for malignant neoplasm of colon: Secondary | ICD-10-CM | POA: Diagnosis not present

## 2022-02-18 NOTE — Patient Instructions (Signed)
[name] , Thank you for taking time to come for your Medicare Wellness Visit. I appreciate your ongoing commitment to your health goals. Please review the following plan we discussed and let me know if I can assist you in the future.   Screening recommendations/referrals: Colonoscopy: referral 02/18/2022 Mammogram: 02/07/2022 Bone Density: not of age  Recommended yearly ophthalmology/optometry visit for glaucoma screening and checkup Recommended yearly dental visit for hygiene and checkup  Vaccinations: Influenza vaccine: completed  Pneumococcal vaccine: completed  Tdap vaccine: 02/02/2021 Shingles vaccine: completed    Covid-19:completed   Advanced directives:   Conditions/risks identified: Please bring a copy of your health care power of attorney and living will to the office to be added to your chart at your convenience.   Next appointment: Follow up in one year for your annual wellness visit    Preventive Care 65 Years and Older, Female Preventive care refers to lifestyle choices and visits with your health care provider that can promote health and wellness. What does preventive care include? A yearly physical exam. This is also called an annual well check. Dental exams once or twice a year. Routine eye exams. Ask your health care provider how often you should have your eyes checked. Personal lifestyle choices, including: Daily care of your teeth and gums. Regular physical activity. Eating a healthy diet. Avoiding tobacco and drug use. Limiting alcohol use. Practicing safe sex. Taking low-dose aspirin every day. Taking vitamin and mineral supplements as recommended by your health care provider. What happens during an annual well check? The services and screenings done by your health care provider during your annual well check will depend on your age, overall health, lifestyle risk factors, and family history of disease. Counseling  Your health care provider may ask you  questions about your: Alcohol use. Tobacco use. Drug use. Emotional well-being. Home and relationship well-being. Sexual activity. Eating habits. History of falls. Memory and ability to understand (cognition). Work and work Statistician. Reproductive health. Screening  You may have the following tests or measurements: Height, weight, and BMI. Blood pressure. Lipid and cholesterol levels. These may be checked every 5 years, or more frequently if you are over 38 years old. Skin check. Lung cancer screening. You may have this screening every year starting at age 32 if you have a 30-pack-year history of smoking and currently smoke or have quit within the past 15 years. Fecal occult blood test (FOBT) of the stool. You may have this test every year starting at age 73. Flexible sigmoidoscopy or colonoscopy. You may have a sigmoidoscopy every 5 years or a colonoscopy every 10 years starting at age 65. Hepatitis C blood test. Hepatitis B blood test. Sexually transmitted disease (STD) testing. Diabetes screening. This is done by checking your blood sugar (glucose) after you have not eaten for a while (fasting). You may have this done every 1-3 years. Bone density scan. This is done to screen for osteoporosis. You may have this done starting at age 42. Mammogram. This may be done every 1-2 years. Talk to your health care provider about how often you should have regular mammograms. Talk with your health care provider about your test results, treatment options, and if necessary, the need for more tests. Vaccines  Your health care provider may recommend certain vaccines, such as: Influenza vaccine. This is recommended every year. Tetanus, diphtheria, and acellular pertussis (Tdap, Td) vaccine. You may need a Td booster every 10 years. Zoster vaccine. You may need this after age 59. Pneumococcal  13-valent conjugate (PCV13) vaccine. One dose is recommended after age 31. Pneumococcal polysaccharide  (PPSV23) vaccine. One dose is recommended after age 22. Talk to your health care provider about which screenings and vaccines you need and how often you need them. This information is not intended to replace advice given to you by your health care provider. Make sure you discuss any questions you have with your health care provider. Document Released: 06/26/2015 Document Revised: 02/17/2016 Document Reviewed: 03/31/2015 Elsevier Interactive Patient Education  2017 [place] Prevention in the Home Falls can cause injuries. They can happen to people of all ages. There are many things you can do to make your home safe and to help prevent falls. What can I do on the outside of my home? Regularly fix the edges of walkways and driveways and fix any cracks. Remove anything that might make you trip as you walk through a door, such as a raised step or threshold. Trim any bushes or trees on the path to your home. Use bright outdoor lighting. Clear any walking paths of anything that might make someone trip, such as rocks or tools. Regularly check to see if handrails are loose or broken. Make sure that both sides of any steps have handrails. Any raised decks and porches should have guardrails on the edges. Have any leaves, snow, or ice cleared regularly. Use sand or salt on walking paths during winter. Clean up any spills in your garage right away. This includes oil or grease spills. What can I do in the bathroom? Use night lights. Install grab bars by the toilet and in the tub and shower. Do not use towel bars as grab bars. Use non-skid mats or decals in the tub or shower. If you need to sit down in the shower, use a plastic, non-slip stool. Keep the floor dry. Clean up any water that spills on the floor as soon as it happens. Remove soap buildup in the tub or shower regularly. Attach bath mats securely with double-sided non-slip rug tape. Do not have throw rugs and other things on the  floor that can make you trip. What can I do in the bedroom? Use night lights. Make sure that you have a light by your bed that is easy to reach. Do not use any sheets or blankets that are too big for your bed. They should not hang down onto the floor. Have a firm chair that has side arms. You can use this for support while you get dressed. Do not have throw rugs and other things on the floor that can make you trip. What can I do in the kitchen? Clean up any spills right away. Avoid walking on wet floors. Keep items that you use a lot in easy-to-reach places. If you need to reach something above you, use a strong step stool that has a grab bar. Keep electrical cords out of the way. Do not use floor polish or wax that makes floors slippery. If you must use wax, use non-skid floor wax. Do not have throw rugs and other things on the floor that can make you trip. What can I do with my stairs? Do not leave any items on the stairs. Make sure that there are handrails on both sides of the stairs and use them. Fix handrails that are broken or loose. Make sure that handrails are as long as the stairways. Check any carpeting to make sure that it is firmly attached to the stairs. Fix any carpet  that is loose or worn. Avoid having throw rugs at the top or bottom of the stairs. If you do have throw rugs, attach them to the floor with carpet tape. Make sure that you have a light switch at the top of the stairs and the bottom of the stairs. If you do not have them, ask someone to add them for you. What else can I do to help prevent falls? Wear shoes that: Do not have high heels. Have rubber bottoms. Are comfortable and fit you well. Are closed at the toe. Do not wear sandals. If you use a stepladder: Make sure that it is fully opened. Do not climb a closed stepladder. Make sure that both sides of the stepladder are locked into place. Ask someone to hold it for you, if possible. Clearly mark and make  sure that you can see: Any grab bars or handrails. First and last steps. Where the edge of each step is. Use tools that help you move around (mobility aids) if they are needed. These include: Canes. Walkers. Scooters. Crutches. Turn on the lights when you go into a dark area. Replace any light bulbs as soon as they burn out. Set up your furniture so you have a clear path. Avoid moving your furniture around. If any of your floors are uneven, fix them. If there are any pets around you, be aware of where they are. Review your medicines with your doctor. Some medicines can make you feel dizzy. This can increase your chance of falling. Ask your doctor what other things that you can do to help prevent falls. This information is not intended to replace advice given to you by your health care provider. Make sure you discuss any questions you have with your health care provider. Document Released: 03/26/2009 Document Revised: 11/05/2015 Document Reviewed: 07/04/2014 Elsevier Interactive Patient Education  2017 [employer].

## 2022-02-18 NOTE — Progress Notes (Signed)
Subjective:   [name] is a 61 y.o. female who presents for Medicare Annual (Subsequent) preventive examination.   Virtual Visit via Telephone Note  I connected with  [name] on 02/18/22 at  9:00 AM EDT by telephone and verified that I am speaking with the correct person using two identifiers.  Location: Patient: home  Provider: [facility]  Persons participating in the virtual visit: patient/Nurse Health Advisor   I discussed the limitations, risks, security and privacy concerns of performing an evaluation and management service by telephone and the availability of in person appointments. The patient expressed understanding and agreed to proceed.  Interactive audio and video telecommunications were attempted between this nurse and patient, however failed, due to patient having technical difficulties OR patient did not have access to video capability.  We continued and completed visit with audio only.  Some vital signs may be absent or patient reported.   Daphane Shepherd, LPN  Review of Systems     Cardiac Risk Factors include: advanced age (>39mn, >>21women);diabetes mellitus;dyslipidemia;hypertension     Objective:    Today's Vitals   02/18/22 0903  Weight: 244 lb (110.7 kg)  Height: _0  (1.626 m)   Body mass index is 41.88 kg/m.     02/18/2022    9:11 AM 02/10/2022    3:51 PM 04/08/2021   10:29 AM 03/05/2021    8:34 AM 02/24/2021    3:49 PM 02/13/2021   11:15 AM 09/15/2020    2:10 PM  Advanced Directives  Does Patient Have a Medical Advance Directive? _1  Yes Yes  Type of AParamed[place]of [place] will Healthcare Power of [place] will Healthcare Power of [place] Does patient want to make changes to medical advance directive?  No - Patient declined Yes (ED - Information included in AVS) No - Patient declined No - Patient  declined No - Patient declined   Copy of HHamiltonin Chart? No - copy requested No - copy requested  No - copy requested No - copy requested      Current Medications (verified) Outpatient Encounter Medications as of 02/18/2022  Medication Sig   acyclovir (ZOVIRAX) 400 MG tablet TAKE 1 TABLET BY MOUTH THREE TIMES DAILY AS NEEDED (FOR  FLARES)   albuterol (VENTOLIN HFA) 108 (90 Base) MCG/ACT inhaler Inhale 2 puffs into the lungs every 6 (six) hours as needed for wheezing or shortness of breath.   AMBULATORY NON FORMULARY MEDICATION Take 30 mLs by mouth as needed (Severe abdominal pain). Medication Name: GI Cocktail: 270 mLViscous Xylocain 2%, 270 mL Dicyclomione10 mg/5 mL, 810 mL Mylanta or Maylox,   BELBUCA 300 MCG FILM Take 1 strip by mouth 2 (two) times daily.   Blood Glucose Monitoring Suppl (ONETOUCH VERIO FLEX SYSTEM) w/Device KIT 1 each by Does not apply route 3 (three) times daily.   budesonide-formoterol (SYMBICORT) 160-4.5 MCG/ACT inhaler Inhale 2 puffs into the lungs 2 (two) times daily.   Continuous Blood Gluc Sensor (FREESTYLE LIBRE 2 SENSOR) MISC Change sensor every 14 days   Continuous Glucose Monitor DEVI Use as directed.   dapagliflozin propanediol (FARXIGA) 10 MG TABS tablet Take 1 tablet (10 mg total) by mouth daily.   diclofenac Sodium (VOLTAREN) 1 % GEL Apply 2 g topically 4 (four) times daily. Rub into affected area of foot 2 to 4 times daily   dicyclomine (  BENTYL) 10 MG capsule TAKE 1 CAPSULE BY MOUTH THREE TIMES DAILY BEFORE MEAL(S)   esomeprazole (NEXIUM) 40 MG capsule Take 1 capsule (40 mg total) by mouth 2 (two) times daily before a meal. Take 30 minutes before breakfast and dinner.   famotidine (PEPCID) 20 MG tablet Take 1 tablet (20 mg total) by mouth 2 (two) times daily as needed.   gabapentin (NEURONTIN) 600 MG tablet Take 1 tablet (600 mg total) by mouth 2 (two) times daily.   glucose blood (FREESTYLE TEST STRIPS) test strip 1 each by Other route 3  (three) times daily.   hydrochlorothiazide (HYDRODIURIL) 25 MG tablet Take 1 tablet (25 mg total) by mouth daily.   insulin aspart (NOVOLOG) 100 UNIT/ML injection Max daily 120 units daily   Insulin Disposable Pump (OMNIPOD DASH INTRO, GEN 4,) KIT 1 Device by Does not apply route every 3 (three) days.   Insulin Syringe-Needle U-100 (INSULIN SYRINGE 1CC/30GX1/2") 30G X 1/2" 1 ML MISC Use to fill Vgo daily   letrozole (FEMARA) 2.5 MG tablet Take 1 tablet (2.5 mg total) by mouth daily.   metFORMIN (GLUCOPHAGE) 1000 MG tablet Take 1 tablet (1,000 mg total) by mouth 2 (two) times daily with a meal.   Multiple Vitamin (MULTIVITAMIN WITH MINERALS) TABS tablet Take 1 tablet by mouth daily.   NARCAN 4 MG/0.1ML LIQD nasal spray kit Place 0.4 mg into the nose once.   ONETOUCH DELICA LANCETS FINE MISC Check blood sugar 3 times a day   oxyCODONE-acetaminophen (PERCOCET) 10-325 MG tablet Take 1 tablet by mouth 4 (four) times daily as needed for pain.   potassium chloride SA (KLOR-CON M) 20 MEQ tablet Take 1 tablet (20 mEq total) by mouth 2 (two) times daily for 3 days.   rosuvastatin (CRESTOR) 10 MG tablet Take 1 tablet (10 mg total) by mouth daily.   Semaglutide, 2 MG/DOSE, (OZEMPIC, 2 MG/DOSE,) 8 MG/3ML SOPN Inject 2 mg into the skin once a week.   tiZANidine (ZANAFLEX) 4 MG capsule Take 4 mg by mouth 3 (three) times daily as needed.   valACYclovir (VALTREX) 500 MG tablet TAKE 1 TABLET BY MOUTH TWICE DAILY FOR 5 DAYS AT  TIME  OF  OUTBREAK   ezetimibe (ZETIA) 10 MG tablet Take 1 tablet (10 mg total) by mouth daily.   montelukast (SINGULAIR) 10 MG tablet Take 1 tablet (10 mg total) by mouth at bedtime. (Patient not taking: Reported on 02/18/2022)   No facility-administered encounter medications on file as of 02/18/2022.    Allergies (verified) Breo ellipta [fluticasone furoate-vilanterol] and Lisinopril   History: Past Medical History:  Diagnosis Date   Allergy    Arthritis    back    Asthma    AS  CHILD   Chronic back pain    Chronic leg pain    due to back pain   Cocaine abuse ([place])    in remission   COPD (chronic obstructive pulmonary disease) ([place])    Diabetes ([place])    with neuropathy   GERD (gastroesophageal reflux disease)    Hyperlipidemia    Hypertension    Internal hemorrhoids    Intraductal papilloma of left breast 02/18/2016   Neuromuscular disorder (H[facility])    neuropathy   Personal history of radiation therapy    Postlaminectomy syndrome of lumbar region 12/07/2011   RECTAL BLEEDING 12/09/2008   Annotation: s/p EGD 7/08 mild gastritis, s/p colonoscopy 7/08- benign polyp  s/p polypectomy and isolated diverticulum.  Qualifier: Diagnosis of  By: Ditzler RN,  Debra     Sciatica    per patient    Sleep apnea    CPAP    YEARS AGO DONE 1/2 YEARS AGO AND WAS TOLD DID NOT HAVE   Tobacco abuse    Tubular adenoma of colon    Uterine fibroid    s/p hysterectomy   Past Surgical History:  Procedure Laterality Date   BACK SURGERY  2012   L5-S1 microendoscopic disectomy last surgery 06/2011   BREAST BIOPSY     LEFT    01/19/16   BREAST BIOPSY Right 02/16/2021   BREAST EXCISIONAL BIOPSY Left 02/2016   BREAST LUMPECTOMY Right 03/12/2021   BREAST LUMPECTOMY WITH RADIOACTIVE SEED AND SENTINEL LYMPH NODE BIOPSY Right 03/12/2021   Procedure: RIGHT BREAST LUMPECTOMY WITH RADIOACTIVE SEED AND SENTINEL LYMPH NODE BIOPSY;  Surgeon: Jovita Kussmaul, MD;  Location: [facility];  Service: General;  Laterality: Right;   BREAST REDUCTION SURGERY  1982   CATARACT EXTRACTION Right 11/07/2019   COLONOSCOPY     HAND SURGERY     MIDDLE TRIGGER FINGER RIGHT SIDE   RADIOACTIVE SEED GUIDED EXCISIONAL BREAST BIOPSY Left 02/18/2016   Procedure: LEFT RADIOACTIVE SEED GUIDED EXCISIONAL BREAST BIOPSY;  Surgeon: Alphonsa Overall, MD;  Location: [facility];  Service: General;  Laterality: Left;   REDUCTION MAMMAPLASTY Bilateral    TONSILLECTOMY  2008   TOTAL ABDOMINAL HYSTERECTOMY  05/24/2007   hysterectomy   TUBAL  LIGATION     UPPER GASTROINTESTINAL ENDOSCOPY     Family History  Problem Relation Age of Onset   Diabetes Father    Heart disease Father    Hypertension Father    Diabetes Mother    Cancer Mother        brain   Hypertension Mother    Kidney disease Sister    Diabetes Sister    Kidney cancer Sister    Stroke Sister    Diabetes Sister    Hypertension Sister    Diabetes Sister    Hypertension Sister    Obesity Son    Colon cancer Neg Hx    Colon polyps Neg Hx    Esophageal cancer Neg Hx    Rectal cancer Neg Hx    Stomach cancer Neg Hx    Social History   Socioeconomic History   Marital status: Married    Spouse name: Not on file   Number of children: 3   Years of education: Not on file   Highest education level: Not on file  Occupational History    Comment: [employer]  Tobacco Use   Smoking status: Former    Packs/day: 0.50    Years: 20.00    Total pack years: 10.00    Types: Cigarettes    Quit date: 07/23/1996    Years since quitting: 25.5   Smokeless tobacco: Never  Vaping Use   Vaping Use: Never used  Substance and Sexual Activity   Alcohol use: No    Alcohol/week: 0.0 standard drinks of alcohol    Comment: recovering addict clean for 9 years   Drug use: No    Types: Cocaine, Marijuana    Comment: recovering addict clean for 9 years   Sexual activity: Yes    Birth control/protection: Surgical  Other Topics Concern   Not on file  Social History Narrative   Current Social History 01/17/2020        Patient lives with spouse in a home which is 1 story. There are not steps up to  the entrance the patient uses.       Patient's method of transportation is personal car.      The highest level of education was some college.      The patient currently works part-time.      Identified important Relationships are God, husband, kids, grandkids       Pets : None       Interests / Fun: Drawing and nature       Current Stressors: Pain, Covid        Religious / Personal Beliefs: Christian       Other: None    Social Determinants of Health   Financial Resource Strain: Low Risk  (02/18/2022)   Overall Financial Resource Strain (CARDIA)    Difficulty of Paying Living Expenses: Not hard at all  Food Insecurity: No Food Insecurity (02/18/2022)   Hunger Vital Sign    Worried About Running Out of Food in the Last Year: Never true    [place] in the Last Year: Never true  Transportation Needs: No Transportation Needs (02/18/2022)   PRAPARE - Hydrologist (Medical): No    Lack of Transportation (Non-Medical): No  Physical Activity: Insufficiently Active (02/18/2022)   Exercise Vital Sign    Days of Exercise per Week: 2 days    Minutes of Exercise per Session: 30 min  Stress: No Stress Concern Present (02/18/2022)   [place]    Feeling of Stress : Not at all  Social Connections: Moderately Integrated (02/18/2022)   Social Connection and Isolation Panel [NHANES]    Frequency of Communication with Friends and Family: More than three times a week    Frequency of Social Gatherings with Friends and Family: More than three times a week    Attends Religious Services: More than 4 times per year    Active Member of [employer] or Organizations: No    Attends Music therapist: Never    Marital Status: Married    Tobacco Counseling Counseling given: Not Answered   Clinical Intake:  Pre-visit preparation completed: Yes  Pain : No/denies pain     Nutritional Risks: None Diabetes: No  How often do you need to have someone help you when you read instructions, pamphlets, or other written materials from your doctor or pharmacy?: 1 - Never  Diabetic?yes  Nutrition Risk Assessment:  Has the patient had any N/V/D within the last 2 months?  No  Does the patient have any non-healing wounds?  No  Has the patient had any unintentional weight  loss or weight gain?  No   Diabetes:  Is the patient diabetic?  Yes  If diabetic, was a CBG obtained today?  No  Did the patient bring in their glucometer from home?  No  How often do you monitor your CBG's? 3 x day .   Financial Strains and Diabetes Management:  Are you having any financial strains with the device, your supplies or your medication? No .  Does the patient want to be seen by Chronic Care Management for management of their diabetes?  No  Would the patient like to be referred to a Nutritionist or for Diabetic Management?  No   Diabetic Exams:  Diabetic Eye Exam: Completed 11/2021 Diabetic Foot Exam: Overdue, Pt has been advised about the importance in completing this exam. Pt is scheduled for diabetic foot exam on next office visit .   Interpreter  Needed?: No   Information entered by :: Jadene Pierini, LPN   Activities of Daily Living    02/18/2022    9:11 AM 02/10/2022    3:56 PM  In your present state of health, do you have any difficulty performing the following activities:  Hearing? 0 0  Vision? 0   Difficulty concentrating or making decisions? 0   Walking or climbing stairs? 0   Dressing or bathing? 0   Doing errands, shopping? 0   Preparing Food and eating ? N   Using the Toilet? N   In the past six months, have you accidently leaked urine? N   Do you have problems with loss of bowel control? N   Managing your Medications? N   Managing your Finances? N   Housekeeping or managing your Housekeeping? N     Patient Care Team: Horald Pollen, MD as PCP - General (Internal Medicine) Shamleffer, Melanie Crazier, MD as Consulting Physician (Endocrinology) [facility] Deneise Lever, MD as Consulting Physician (Pulmonary Disease) Trula Slade, DPM as Consulting Physician (Podiatry) Ladene Artist, MD as Consulting Physician (Gastroenterology) Jovita Kussmaul, MD as Consulting Physician (General Surgery) Nicholas Lose, MD  as Consulting Physician (Hematology and Oncology) Kyung Rudd, MD as Consulting Physician (Radiation Oncology)  Indicate any recent Medical Services you may have received from other than [facility] providers in the past year (date may be approximate).     Assessment:   This is a routine wellness examination for Kseniya.  Hearing/Vision screen Vision Screening - Comments:: Annual eye exams wears glasses    Dietary issues and exercise activities discussed: Current Exercise Habits: Home exercise routine, Time (Minutes): 30, Frequency (Times/Week): 3, Weekly Exercise (Minutes/Week): 90, Intensity: Mild, Exercise limited by: None identified   Goals Addressed               This Visit's Progress     Exercise 3x per week (30 min per time) (pt-stated)   On track      Depression Screen    02/18/2022    9:16 AM 02/18/2022    9:09 AM 02/10/2022    3:28 PM 08/16/2021    1:02 PM 02/13/2021   11:08 AM 09/15/2020    2:09 PM 04/10/2020    9:38 AM  PHQ 2/9 Scores  PHQ - 2 Score 0 1 1 0 0 0 0  PHQ- 9 Score      0 0  Exception Documentation     Medical reason      Fall Risk    02/18/2022    9:04 AM 02/10/2022    3:37 PM 08/16/2021    1:02 PM 02/13/2021   11:15 AM 09/15/2020    2:09 PM  [place] in the past year? 0 0 0 1 1  Number falls in past yr: 0 0 0 1 1  Injury with Fall? 0  0 0 0  Risk for fall due to : No Fall Risks Impaired balance/gait;Impaired mobility;Orthopedic patient  Impaired balance/gait;Other (Comment) Impaired balance/gait  Risk for fall due to: Comment    Due to chronic pain pt can sometimes lose balance and fall.   Follow up Falls prevention discussed Falls evaluation completed       FALL RISK PREVENTION PERTAINING TO THE HOME:  Any stairs in or around the home? No  If so, are there any without handrails? No  Home free of loose throw rugs in walkways, pet beds, electrical cords, etc? Yes  Adequate lighting in your home to reduce risk of falls? Yes   ASSISTIVE DEVICES  UTILIZED TO PREVENT FALLS:  Life alert? No  Use of a cane, walker or w/c? No  Grab bars in the bathroom? No  Shower chair or bench in shower? No  Elevated toilet seat or a handicapped toilet? No          02/18/2022    9:12 AM 02/13/2021   11:19 AM  6CIT Screen  What Year? 0 points 0 points  What month? 0 points 0 points  What time? 0 points 0 points  Count back from 20 0 points 0 points  Months in reverse 0 points 0 points  Repeat phrase 0 points 0 points  Total Score 0 points 0 points    Immunizations Immunization History  Administered Date(s) Administered   Influenza Split 03/11/2011, 03/02/2012   Influenza Whole 02/12/2010   Influenza,inj,Quad PF,6+ Mos 03/08/2013, 02/27/2014, 05/19/2015, 04/19/2016, 03/09/2017, 02/06/2018, 02/12/2019, 04/10/2020   Influenza-Unspecified 03/18/2021   PFIZER(Purple Top)SARS-COV-2 Vaccination 07/25/2019, 08/19/2019, 03/21/2020, 01/03/2021   PPD Test 10/04/2010, 07/11/2011, 12/05/2012   Pneumococcal Polysaccharide-23 01/13/2012, 07/18/2017   Tdap 11/18/2010, 02/02/2021   Zoster Recombinat (Shingrix) 08/31/2021    TDAP status: Up to date  Flu Vaccine status: Up to date  Pneumococcal vaccine status: Up to date  Covid-19 vaccine status: Completed vaccines  Qualifies for Shingles Vaccine? Yes   Zostavax completed No   Shingrix Completed?: No.    Education has been provided regarding the importance of this vaccine. Patient has been advised to call insurance company to determine out of pocket expense if they have not yet received this vaccine. Advised may also receive vaccine at local pharmacy or Health Dept. Verbalized acceptance and understanding.  Screening Tests Health Maintenance  Topic Date Due   COVID-19 Vaccine (5 - Pfizer risk series) 02/28/2021   OPHTHALMOLOGY EXAM  07/21/2021   FOOT EXAM  08/11/2021   COLONOSCOPY (Pts 45-63yr Insurance coverage will need to be confirmed)  10/14/2021   Zoster Vaccines- Shingrix (2 of 2)  10/26/2021   HEMOGLOBIN A1C  11/16/2021   INFLUENZA VACCINE  01/11/2022   Diabetic kidney evaluation - Urine ACR  02/19/2022   LIPID PANEL  02/19/2022   Diabetic kidney evaluation - GFR measurement  02/05/2023   MAMMOGRAM  02/08/2023   TETANUS/TDAP  02/03/2031   Hepatitis C Screening  Completed   HIV Screening  Completed   HPV VACCINES  Aged Out    Health Maintenance  Health Maintenance Due  Topic Date Due   COVID-19 Vaccine (5 - Pfizer risk series) 02/28/2021   OPHTHALMOLOGY EXAM  07/21/2021   FOOT EXAM  08/11/2021   COLONOSCOPY (Pts 45-46yrInsurance coverage will need to be confirmed)  10/14/2021   Zoster Vaccines- Shingrix (2 of 2) 10/26/2021   HEMOGLOBIN A1C  11/16/2021   INFLUENZA VACCINE  01/11/2022   Diabetic kidney evaluation - Urine ACR  02/19/2022    Colorectal cancer screening: Referral to GI placed 02/18/2022. Pt aware the office will call re: appt.  Mammogram status: Completed 02/07/2022. Repeat every year  Bone Density status: Ordered not of age . Pt provided with contact info and advised to call to schedule appt.  Lung Cancer Screening: (Low Dose CT Chest recommended if Age 61-80ears, 30 pack-year currently smoking OR have quit w/in 15years.) does not qualify.   Lung Cancer Screening Referral: n/a  Additional Screening:  Hepatitis C Screening: does not qualify;  Vision Screening: Recommended annual ophthalmology exams for early detection  of glaucoma and other disorders of the eye. Is the patient up to date with their annual eye exam?  Yes  Who is the provider or what is the name of the office in which the patient attends annual eye exams? Dr.Groat  If pt is not established with a provider, would they like to be referred to a provider to establish care? No .   Dental Screening: Recommended annual dental exams for proper oral hygiene  Community Resource Referral / Chronic Care Management: CRR required this visit?  No   CCM required this visit?  No       Plan:     I have personally reviewed and noted the following in the patient's chart:   Medical and social history Use of alcohol, tobacco or illicit drugs  Current medications and supplements including opioid prescriptions. Patient is currently taking opioid prescriptions. Information provided to patient regarding non-opioid alternatives. Patient advised to discuss non-opioid treatment plan with their provider. Functional ability and status Nutritional status Physical activity Advanced directives List of other physicians Hospitalizations, surgeries, and ER visits in previous 12 months Vitals Screenings to include cognitive, depression, and falls Referrals and appointments  In addition, I have reviewed and discussed with patient certain preventive protocols, quality metrics, and best practice recommendations. A written personalized care plan for preventive services as well as general preventive health recommendations were provided to patient.     Daphane Shepherd, LPN   11/15/4648   Nurse Notes: Schedule appointment for Colonoscopy referral 02/18/2022

## 2022-02-22 ENCOUNTER — Ambulatory Visit: Payer: PPO | Admitting: Emergency Medicine

## 2022-02-24 ENCOUNTER — Ambulatory Visit: Payer: PPO | Admitting: Emergency Medicine

## 2022-02-25 ENCOUNTER — Inpatient Hospital Stay: Payer: PPO | Admitting: Hematology and Oncology

## 2022-02-28 ENCOUNTER — Other Ambulatory Visit: Payer: Self-pay

## 2022-03-03 ENCOUNTER — Other Ambulatory Visit (HOSPITAL_COMMUNITY): Payer: Self-pay

## 2022-03-03 ENCOUNTER — Ambulatory Visit: Payer: Self-pay | Admitting: *Deleted

## 2022-03-03 NOTE — Patient Outreach (Signed)
  Care Coordination   03/03/2022 Name: [name] MRN: [id number] DOB: 1961/02/03   Care Coordination Outreach Attempts:  An unsuccessful telephone outreach was attempted today to offer the patient information about available care coordination services as a benefit of their health plan.   Follow Up Plan:  Additional outreach attempts will be made to offer the patient care coordination information and services.   Encounter Outcome:  No Answer  Care Coordination Interventions Activated:  No   Care Coordination Interventions:  No, not indicated    SIG Edessa Jakubowicz C. Myrtie Neither, MSN, [facility] Gerontological Nurse Practitioner [facility] Care Management [phone]

## 2022-03-04 ENCOUNTER — Other Ambulatory Visit: Payer: Self-pay

## 2022-03-04 ENCOUNTER — Encounter: Payer: Self-pay | Admitting: Gastroenterology

## 2022-03-04 DIAGNOSIS — R1013 Epigastric pain: Secondary | ICD-10-CM

## 2022-03-05 NOTE — Progress Notes (Signed)
 Patient Care Team: Sagardia, Miguel Jose, MD as PCP - General (Internal Medicine) Shamleffer, Ibtehal Jaralla, MD as Consulting Physician (Endocrinology) [facility] Young, Clinton D, MD as Consulting Physician (Pulmonary Disease) Wagoner, Matthew R, DPM as Consulting Physician (Podiatry) Stark, Malcolm T, MD as Consulting Physician (Gastroenterology) Toth, [name] III, MD as Consulting Physician (General Surgery) Gudena, Vinay, MD as Consulting Physician (Hematology and Oncology) Moody, John, MD as Consulting Physician (Radiation Oncology)  DIAGNOSIS: No diagnosis found.  SUMMARY OF ONCOLOGIC HISTORY: Oncology History  Malignant neoplasm of upper-outer quadrant of right breast in female, estrogen receptor positive (HCC)  02/16/2021 Initial Diagnosis   Screening mammogram: possible asymmetry in the right breast. Diagnostic mammogram and US: 0.7 cm suspicious mass in the right breast at 10:00. 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%), HER2 negative by FISH, Ki-67 5%   02/24/2021 Cancer Staging   Staging form: Breast, AJCC 8th Edition - Clinical stage from 02/24/2021: Stage IA (cT1b, cN0, cM0, G1, ER+, PR+, HER2-) - Signed by Gudena, Vinay, MD on 02/24/2021 Stage prefix: Initial diagnosis Histologic grading system: 3 grade system   03/12/2021 Surgery   Right lumpectomy: Grade 1 IDC 1.2 cm, low-grade DCIS, margins negative, 0/5 lymph nodes negative ER greater than 95%, PR 40%, HER2 negative, Ki-67 5%   03/12/2021 Cancer Staging   Staging form: Breast, AJCC 8th Edition - Pathologic stage from 03/12/2021: Stage IA (pT1c, pN0, cM0, G2, ER+, PR+, HER2-) - Signed by Causey, Lindsey Cornetto, NP on 08/23/2021 Stage prefix: Initial diagnosis Histologic grading system: 3 grade system   03/25/2021 Oncotype testing   Oncotype DX recurrence score: 27, distant recurrence at 9 years: 16%   04/13/2021 - 04/13/2021 Chemotherapy   Patient is on Treatment Plan : BREAST TC q21d      04/22/2021 - 05/27/2021 Radiation Therapy    Site Technique Total Dose (Gy) Dose per Fx (Gy) Completed Fx Beam Energies  Breast, Right: Breast_Rt 3D 42.56/42.56 2.66 16/16 10X  Breast, Right: Breast_Rt_Bst 3D 8/8 2 4/4 6X, 10X    06/2021 -  Anti-estrogen oral therapy   Anti-estrogen therapy with Letrozole daily     CHIEF COMPLIANT: Follow-up of right breast cancer    INTERVAL HISTORY: [name] is a 60 y.o. with above-mentioned history of right breast cancer having undergone lumpectomy. She presents to the clinic today for follow-up.   ALLERGIES:  is allergic to breo ellipta [fluticasone furoate-vilanterol] and lisinopril.  MEDICATIONS:  Current Outpatient Medications  Medication Sig Dispense Refill   acyclovir (ZOVIRAX) 400 MG tablet TAKE 1 TABLET BY MOUTH THREE TIMES DAILY AS NEEDED (FOR  FLARES) 90 tablet 0   albuterol (VENTOLIN HFA) 108 (90 Base) MCG/ACT inhaler Inhale 2 puffs into the lungs every 6 (six) hours as needed for wheezing or shortness of breath. 18 g 12   AMBULATORY NON FORMULARY MEDICATION Take 30 mLs by mouth as needed (Severe abdominal pain). Medication Name: GI Cocktail: 270 mLViscous Xylocain 2%, 270 mL Dicyclomione10 mg/5 mL, 810 mL Mylanta or Maylox, 1350 mL 0   BELBUCA 300 MCG FILM Take 1 strip by mouth 2 (two) times daily.     Blood Glucose Monitoring Suppl (ONETOUCH VERIO FLEX SYSTEM) w/Device KIT 1 each by Does not apply route 3 (three) times daily. 1 kit 1   budesonide-formoterol (SYMBICORT) 160-4.5 MCG/ACT inhaler Inhale 2 puffs into the lungs 2 (two) times daily. 1 each 6   Continuous Blood Gluc Sensor (FREESTYLE LIBRE 2 SENSOR) MISC Change sensor every 14 days 6 each   3   Continuous Glucose Monitor DEVI Use as directed. 1 each 0   dapagliflozin propanediol (FARXIGA) 10 MG TABS tablet Take 1 tablet (10 mg total) by mouth daily. 90 tablet 0   diclofenac Sodium (VOLTAREN) 1 % GEL Apply 2 g topically 4 (four) times daily. Rub into affected area of foot 2  to 4 times daily 100 g 2   dicyclomine (BENTYL) 10 MG capsule TAKE 1 CAPSULE BY MOUTH THREE TIMES DAILY BEFORE MEAL(S) 90 capsule 11   esomeprazole (NEXIUM) 40 MG capsule Take 1 capsule (40 mg total) by mouth 2 (two) times daily before a meal. Take 30 minutes before breakfast and dinner. 60 capsule 11   ezetimibe (ZETIA) 10 MG tablet Take 1 tablet (10 mg total) by mouth daily. 30 tablet 11   famotidine (PEPCID) 20 MG tablet Take 1 tablet (20 mg total) by mouth 2 (two) times daily as needed. 60 tablet 11   gabapentin (NEURONTIN) 600 MG tablet Take 1 tablet (600 mg total) by mouth 2 (two) times daily. 180 tablet 1   glucose blood (FREESTYLE TEST STRIPS) test strip 1 each by Other route 3 (three) times daily. 300 each 3   hydrochlorothiazide (HYDRODIURIL) 25 MG tablet Take 1 tablet (25 mg total) by mouth daily. 90 tablet 1   insulin aspart (NOVOLOG) 100 UNIT/ML injection Max daily 120 units daily 110 mL 3   Insulin Disposable Pump (OMNIPOD DASH INTRO, GEN 4,) KIT 1 Device by Does not apply route every 3 (three) days. 1 kit 0   Insulin Syringe-Needle U-100 (INSULIN SYRINGE 1CC/30GX1/2") 30G X 1/2" 1 ML MISC Use to fill Vgo daily 100 each 5   letrozole (FEMARA) 2.5 MG tablet Take 1 tablet (2.5 mg total) by mouth daily. 90 tablet 3   metFORMIN (GLUCOPHAGE) 1000 MG tablet Take 1 tablet (1,000 mg total) by mouth 2 (two) times daily with a meal. 180 tablet 0   montelukast (SINGULAIR) 10 MG tablet Take 1 tablet (10 mg total) by mouth at bedtime. (Patient not taking: Reported on 02/18/2022) 30 tablet 11   Multiple Vitamin (MULTIVITAMIN WITH MINERALS) TABS tablet Take 1 tablet by mouth daily.     NARCAN 4 MG/0.1ML LIQD nasal spray kit Place 0.4 mg into the nose once.     ONETOUCH DELICA LANCETS FINE MISC Check blood sugar 3 times a day 100 each 12   oxyCODONE-acetaminophen (PERCOCET) 10-325 MG tablet Take 1 tablet by mouth 4 (four) times daily as needed for pain.     potassium chloride SA (KLOR-CON M) 20 MEQ  tablet Take 1 tablet (20 mEq total) by mouth 2 (two) times daily for 3 days. 6 tablet 0   rosuvastatin (CRESTOR) 10 MG tablet Take 1 tablet (10 mg total) by mouth daily. 90 tablet 3   Semaglutide, 2 MG/DOSE, (OZEMPIC, 2 MG/DOSE,) 8 MG/3ML SOPN Inject 2 mg into the skin once a week. 9 mL 3   tiZANidine (ZANAFLEX) 4 MG capsule Take 4 mg by mouth 3 (three) times daily as needed.     valACYclovir (VALTREX) 500 MG tablet TAKE 1 TABLET BY MOUTH TWICE DAILY FOR 5 DAYS AT  TIME  OF  OUTBREAK 10 tablet 0   No current facility-administered medications for this visit.    PHYSICAL EXAMINATION: ECOG PERFORMANCE STATUS: {CHL ONC ECOG PS:[id number]}  There were no vitals filed for this visit. There were no vitals filed for this visit.  BREAST:*** No palpable masses or nodules in either right or left breasts. No palpable   axillary supraclavicular or infraclavicular adenopathy no breast tenderness or nipple discharge. (exam performed in the presence of a chaperone)  LABORATORY DATA:  I have reviewed the data as listed    Latest Ref Rng & Units 02/04/2022   10:33 AM 02/24/2021   12:22 PM 02/19/2021    9:13 AM  CMP  Glucose 70 - 99 mg/dL 152  263  175   BUN 6 - 23 mg/dL 10  11  12   Creatinine 0.40 - 1.20 mg/dL 0.72  0.96  0.71   Sodium 135 - 145 mEq/L 141  138  139   Potassium 3.5 - 5.1 mEq/L 3.3  3.9  4.0   Chloride 96 - 112 mEq/L 96  96  98   CO2 19 - 32 mEq/L 34  27  31   Calcium 8.4 - 10.5 mg/dL 9.9  10.3  9.6   Total Protein 6.0 - 8.3 g/dL 7.6  8.3    Total Bilirubin 0.2 - 1.2 mg/dL 0.3  0.4    Alkaline Phos 39 - 117 U/L 61  63    AST 0 - 37 U/L 23  27    ALT 0 - 35 U/L 23  30      Lab Results  Component Value Date   WBC 4.7 02/04/2022   HGB 13.6 02/04/2022   HCT 41.6 02/04/2022   MCV 82.1 02/04/2022   PLT 209.0 02/04/2022   NEUTROABS 2.5 02/04/2022    ASSESSMENT & PLAN:  No problem-specific Assessment & Plan notes found for this encounter.    No orders of the defined types were  placed in this encounter.  The patient has a good understanding of the overall plan. she agrees with it. she will call with any problems that may develop before the next visit here. Total time spent: 30 mins including face to face time and time spent for planning, charting and co-ordination of care   Deritra L Mcnairy, CMA 03/05/22    I Deritra, Mcnairy am scribing for Dr. Gudena  ***  

## 2022-03-07 ENCOUNTER — Ambulatory Visit (AMBULATORY_SURGERY_CENTER): Payer: Self-pay | Admitting: *Deleted

## 2022-03-07 ENCOUNTER — Telehealth: Payer: Self-pay | Admitting: *Deleted

## 2022-03-07 VITALS — Ht 64.0 in | Wt 244.0 lb

## 2022-03-07 DIAGNOSIS — Z86010 Personal history of colonic polyps: Secondary | ICD-10-CM

## 2022-03-07 DIAGNOSIS — E1140 Type 2 diabetes mellitus with diabetic neuropathy, unspecified: Secondary | ICD-10-CM | POA: Diagnosis not present

## 2022-03-07 MED ORDER — GOLYTELY 236 G PO SOLR: 4000.0000 mL | Freq: Once | ORAL | 0 refills | Status: AC

## 2022-03-07 NOTE — Telephone Encounter (Signed)
Please obtain instructions for patient's insulin pump. Thanks.

## 2022-03-07 NOTE — Progress Notes (Signed)
No egg or soy allergy known to patient  No issues known to pt with past sedation with any surgeries or procedures Patient denies ever being told they had issues or difficulty with intubation  No FH of Malignant Hyperthermia Pt is not on diet pills Pt is not on  home 02  Pt is not on blood thinners  Pt denies issues with constipation  Pt encouraged to use to use Singlecare or Goodrx to reduce cost   TE sent to [employer] EK:[id number] pump instructions

## 2022-03-07 NOTE — Telephone Encounter (Signed)
[name] 1961-02-13 [id number]   Dear Dr. Dr. Kelton Pillar,    Dr. Fuller Plan has scheduled the above individual for a(n) colonoscopy at 9:30 am on 04/06/22.  Our records show that this patient is on insulin therapy via an insulin pump.  Our colonoscopy prep protocol requires that:   the patient must be on a clear liquid diet the entire day prior to the procedure date as well as the morning of the procedure  the patient must be NPO for 3 to 4 hours prior to the procedure   the patient must consume a PEG 3350 solution to prepare for the procedure.  Please advise [place] of any adjustments that need to be made to the patient's insulin pump therapy prior to the above procedure date.    Please route or fax your response to [phone] .  If you have any questions, please call me at [phone].  Thank you for your help with this matter.

## 2022-03-07 NOTE — Progress Notes (Unsigned)
Name: [name]  Age/ Sex: 61 y.o., female   MRN/ DOB: [id number], 01/02/1961     PCP: Horald Pollen, MD   Reason for Endocrinology Evaluation: Type 2 Diabetes Mellitus  Initial Endocrine Consultative Visit: 03/18/2019    PATIENT IDENTIFIER: [name] is a 61 y.o. female with a past medical history of T2DM, HTN, OSA and dyslipidemia , breast CA(Dx 02/2021) status post right lumpectomy chemo and radiation. The patient has followed with Endocrinology clinic since 03/18/2019 for consultative assistance with management of her diabetes.  DIABETIC HISTORY:  [name] was diagnosed with T2DM many years ago. Has been on Soliqua, Glipizide and V-Go was started in 2019.  Her hemoglobin A1c has ranged from 8.1% in 2016, peaking at 9.8% in 2020.  On her initial visit to our clinic, she was on V-Go 40 with Humulin U-500 , Metformin, and Ozempic with an A1c 9.7% . We stopped the V-Go  And the U-500 due to recurrent hypoglycemia . We started Novolog Mix and continued metformin and Ozempic.   SGLT-2 inhibitor started through PCP 03/2020  SUBJECTIVE:   During the last visit (05/05/2021): A1c 8.2% .  We continued Farxiga,Ozempic, metformin, and NovoLog per OmniPod   Today (03/08/2022): [name] is here for a follow up on her diabetes care.  She checks her blood sugars multiple times daily, through freestyle libre. The patient has not  had hypoglycemic episodes since the last clinic visit.   She continues to follow-up with oncology for Breast Ca that was diagnosed 02/2021, S/P lumpectomy , chemo and radiation  She has an upcoming colonoscopy   She has noted blurry vision and attributes this to Ozempic    This patient with type 2 diabetes is treated with Omnipod  (insulin pump). During the visit the pump basal and bolus doses were reviewed including carb/insulin rations and supplemental doses. The clinical list was updated. The glucose meter download was reviewed in detail to determine  if the current pump settings are providing the best glycemic control without excessive hypoglycemia.  Pump and meter download:    Pump   Omnipod Settings   Insulin type   Novolog    Basal rate       0000 3u/h       I:C ratio       0000 1:1                  Sensitivity       0000  20      Goal       0000  120            Type & Model of Pump: Omnipod Insulin Type: Currently using Novolog .  Body mass index is 42.05 kg/m.  PUMP STATISTICS: Average BG: 157 BG Readings: 1.7 / day Average Daily Carbs (g): 25.4 Average Total Daily Insulin: 87.3 Average Daily Basal:58.6( 70%) Average Daily Bolus: 24.6 (30 %)  She enters 10-12  units with a full breakfast and 4 units if eats a banana /coffee Enters 14 for lunch and supper   HOME DIABETES REGIMEN:  Metformin 1000 tablet Twice daily  Ozempic 2 mg weekly ( Fridays) Farxiga 10 mg daily  Novolog     CONTINUOUS GLUCOSE MONITORING RECORD INTERPRETATION    Dates of Recording: 9/13-9/26/2023 Sensor description: [employer]  Results statistics:   CGM use % of time 72  Average and SD 154/21.8  Time in range   80 %  % Time  Above 180 20  % Time above 250 0  % Time Below target 0    Glycemic patterns summary: OPtimal BG's during the night ,and day   Hypoglycemic episodes occurred N/A  Hyperglycemia : Postprandial at times  Overnight periods: stable      DIABETIC COMPLICATIONS: Microvascular complications:  Neuropathy  Denies: CKD, retinopathy  Last eye exam: Completed 12/07/2021   Macrovascular complications:    Denies: CAD, PVD, CVA    HISTORY:  Past Medical History:  Past Medical History:  Diagnosis Date   Allergy    Arthritis    back    Asthma    AS CHILD   Cancer ([place])    breast CA- Right,   Chronic back pain    Chronic leg pain    due to back pain   Cocaine abuse ([place])    in remission   COPD (chronic obstructive pulmonary disease) ([place])    Diabetes ([place])    with neuropathy    GERD (gastroesophageal reflux disease)    Hyperlipidemia    Hypertension    Internal hemorrhoids    Intraductal papilloma of left breast 02/18/2016   Neuromuscular disorder (HCC)    neuropathy   Personal history of radiation therapy    Postlaminectomy syndrome of lumbar region 12/07/2011   RECTAL BLEEDING 12/09/2008   Annotation: s/p EGD 7/08 mild gastritis, s/p colonoscopy 7/08- benign polyp  s/p polypectomy and isolated diverticulum.  Qualifier: Diagnosis of  By: Ditzler RN, Debra     Sciatica    per patient    Sleep apnea    CPAP    YEARS AGO DONE 1/2 YEARS AGO AND WAS TOLD DID NOT HAVE   Tobacco abuse    Tubular adenoma of colon    Uterine fibroid    s/p hysterectomy   Past Surgical History:  Past Surgical History:  Procedure Laterality Date   BACK SURGERY  2012   L5-S1 microendoscopic disectomy last surgery 06/2011   BREAST BIOPSY     LEFT    01/19/16   BREAST BIOPSY Right 02/16/2021   BREAST EXCISIONAL BIOPSY Left 02/2016   BREAST LUMPECTOMY Right 03/12/2021   BREAST LUMPECTOMY WITH RADIOACTIVE SEED AND SENTINEL LYMPH NODE BIOPSY Right 03/12/2021   Procedure: RIGHT BREAST LUMPECTOMY WITH RADIOACTIVE SEED AND SENTINEL LYMPH NODE BIOPSY;  Surgeon: Jovita Kussmaul, MD;  Location: [facility];  Service: General;  Laterality: Right;   BREAST REDUCTION SURGERY  1982   CATARACT EXTRACTION Right 11/07/2019   COLONOSCOPY     HAND SURGERY     MIDDLE TRIGGER FINGER RIGHT SIDE   RADIOACTIVE SEED GUIDED EXCISIONAL BREAST BIOPSY Left 02/18/2016   Procedure: LEFT RADIOACTIVE SEED GUIDED EXCISIONAL BREAST BIOPSY;  Surgeon: Alphonsa Overall, MD;  Location: [facility];  Service: General;  Laterality: Left;   REDUCTION MAMMAPLASTY Bilateral    TONSILLECTOMY  2008   TOTAL ABDOMINAL HYSTERECTOMY  05/24/2007   hysterectomy   TUBAL LIGATION     UPPER GASTROINTESTINAL ENDOSCOPY     Social History:  reports that she quit smoking about 25 years ago. Her smoking use included cigarettes. She has a 10.00  pack-year smoking history. She has never used smokeless tobacco. She reports that she does not currently use alcohol. She reports that she does not currently use drugs after having used the following drugs: Cocaine and Marijuana. Family History:  Family History  Problem Relation Age of Onset   Diabetes Father    Heart disease Father    Hypertension Father  Diabetes Mother    Cancer Mother        brain   Hypertension Mother    Kidney disease Sister    Diabetes Sister    Kidney cancer Sister    Stroke Sister    Diabetes Sister    Hypertension Sister    Diabetes Sister    Hypertension Sister    Obesity Son    Colon cancer Neg Hx    Colon polyps Neg Hx    Esophageal cancer Neg Hx    Rectal cancer Neg Hx    Stomach cancer Neg Hx      HOME MEDICATIONS: Allergies as of 03/08/2022       Reactions   Breo Ellipta [fluticasone Furoate-vilanterol] Other (See Comments)   Urinary retention   Lisinopril Cough        Medication List        Accurate as of March 08, 2022  9:23 AM. If you have any questions, ask your nurse or doctor.          acyclovir 400 MG tablet Commonly known as: ZOVIRAX TAKE 1 TABLET BY MOUTH THREE TIMES DAILY AS NEEDED (FOR  FLARES)   albuterol 108 (90 Base) MCG/ACT inhaler Commonly known as: VENTOLIN HFA Inhale 2 puffs into the lungs every 6 (six) hours as needed for wheezing or shortness of breath.   AMBULATORY NON FORMULARY MEDICATION Take 30 mLs by mouth as needed (Severe abdominal pain). Medication Name: GI Cocktail: 270 mLViscous Xylocain 2%, 270 mL Dicyclomione10 mg/5 mL, 810 mL Mylanta or Maylox,   Belbuca 300 MCG Film Generic drug: Buprenorphine HCl Take 1 strip by mouth 2 (two) times daily.   budesonide-formoterol 160-4.5 MCG/ACT inhaler Commonly known as: SYMBICORT Inhale 2 puffs into the lungs 2 (two) times daily.   Continuous Glucose Monitor Devi Use as directed.   dapagliflozin propanediol 10 MG Tabs tablet Commonly known  as: Farxiga Take 1 tablet (10 mg total) by mouth daily.   diclofenac Sodium 1 % Gel Commonly known as: VOLTAREN Apply 2 g topically 4 (four) times daily. Rub into affected area of foot 2 to 4 times daily What changed: additional instructions   dicyclomine 10 MG capsule Commonly known as: BENTYL TAKE 1 CAPSULE BY MOUTH THREE TIMES DAILY BEFORE MEAL(S)   esomeprazole 40 MG capsule Commonly known as: NexIUM Take 1 capsule (40 mg total) by mouth 2 (two) times daily before a meal. Take 30 minutes before breakfast and dinner.   ezetimibe 10 MG tablet Commonly known as: Zetia Take 1 tablet (10 mg total) by mouth daily.   famotidine 20 MG tablet Commonly known as: PEPCID Take 1 tablet (20 mg total) by mouth 2 (two) times daily as needed.   FreeStyle Libre 2 Sensor Misc Change sensor every 14 days   FREESTYLE TEST STRIPS test strip Generic drug: glucose blood 1 each by Other route 3 (three) times daily.   gabapentin 600 MG tablet Commonly known as: NEURONTIN Take 1 tablet (600 mg total) by mouth 2 (two) times daily.   hydrochlorothiazide 25 MG tablet Commonly known as: HYDRODIURIL Take 1 tablet (25 mg total) by mouth daily.   insulin aspart 100 UNIT/ML injection Commonly known as: NovoLOG Max daily 120 units daily   INSULIN SYRINGE 1CC/30GX1/2" 30G X 1/2" 1 ML Misc Use to fill Vgo daily   letrozole 2.5 MG tablet Commonly known as: FEMARA Take 1 tablet (2.5 mg total) by mouth daily.   metFORMIN 1000 MG tablet Commonly known as: GLUCOPHAGE Take  1 tablet (1,000 mg total) by mouth 2 (two) times daily with a meal.   montelukast 10 MG tablet Commonly known as: SINGULAIR Take 1 tablet (10 mg total) by mouth at bedtime.   multivitamin with minerals Tabs tablet Take 1 tablet by mouth daily.   Narcan 4 MG/0.1ML Liqd nasal spray kit Generic drug: naloxone Place 0.4 mg into the nose once.   Omnipod DASH Intro (Gen 4) Kit 1 Device by Does not apply route every 3 (three)  days.   OneTouch Delica Lancets Fine Misc Check blood sugar 3 times a day   OneTouch Verio Flex System w/Device Kit 1 each by Does not apply route 3 (three) times daily.   oxyCODONE-acetaminophen 10-325 MG tablet Commonly known as: PERCOCET Take 1 tablet by mouth 4 (four) times daily as needed for pain.   Ozempic (2 MG/DOSE) 8 MG/3ML Sopn Generic drug: Semaglutide (2 MG/DOSE) Inject 2 mg into the skin once a week.   potassium chloride SA 20 MEQ tablet Commonly known as: KLOR-CON M Take 1 tablet (20 mEq total) by mouth 2 (two) times daily for 3 days.   rosuvastatin 10 MG tablet Commonly known as: Crestor Take 1 tablet (10 mg total) by mouth daily.   tiZANidine 4 MG capsule Commonly known as: ZANAFLEX Take 4 mg by mouth 3 (three) times daily as needed.   valACYclovir 500 MG tablet Commonly known as: VALTREX TAKE 1 TABLET BY MOUTH TWICE DAILY FOR 5 DAYS AT  TIME  OF  OUTBREAK         OBJECTIVE:   Vital Signs: BP 122/76 (BP Location: Left Arm, Patient Position: Sitting, Cuff Size: Large)   Pulse 85   Ht 5' 4"  (1.626 m)   Wt 245 lb (111.1 kg)   SpO2 99%   BMI 42.05 kg/m   Wt Readings from Last 3 Encounters:  03/08/22 245 lb (111.1 kg)  03/07/22 244 lb (110.7 kg)  02/18/22 244 lb (110.7 kg)     Exam: General:  NAD  Lungs: Clear with good BS bilat with no rales, rhonchi, or wheezes  Heart: RRR with normal S1 and S2 and no gallops; no murmurs; no rub  Extremities: No pretibial edema.     DM foot exam: 09/02/2021  The skin of the feet is intact without sores or ulcerations. The pedal pulses are 2+ on right and 2+ on left. The sensation is intact to a screening 5.07, 10 gram monofilament bilaterally          DATA REVIEWED:  Lab Results  Component Value Date   HGBA1C 6.7 (A) 08/16/2021   HGBA1C 8.2 (A) 05/05/2021   HGBA1C 8.3 (A) 02/19/2021      Latest Reference Range & Units 02/04/22 10:33  Sodium 135 - 145 mEq/L 141  Potassium 3.5 - 5.1 mEq/L  3.3 (L)  Chloride 96 - 112 mEq/L 96  CO2 19 - 32 mEq/L 34 (H)  Glucose 70 - 99 mg/dL 152 (H)  BUN 6 - 23 mg/dL 10  Creatinine 0.40 - 1.20 mg/dL 0.72  Calcium 8.4 - 10.5 mg/dL 9.9  Alkaline Phosphatase 39 - 117 U/L 61  Albumin 3.5 - 5.2 g/dL 4.3  Lipase 11.0 - 59.0 U/L 58.0  AST 0 - 37 U/L 23  ALT 0 - 35 U/L 23  Total Protein 6.0 - 8.3 g/dL 7.6  Total Bilirubin 0.2 - 1.2 mg/dL 0.3  GFR >60.00 mL/min 90.20        ASSESSMENT / PLAN / RECOMMENDATIONS:  1) Type 2 Diabetes Mellitus, Sub- Optimally  Controlled , With Neuropathic complications - Most recent A1c of 7.8 %. Goal A1c < 7.0 %.    - A1c has trended up  from  6.7% to 7.8 %  -I have reviewed her pump download as well as CGM download, patient has been noted with hyperglycemia after breakfast, I will increase her carbohydrate for breakfast as below -She also tends to snack, I have advised the patient to enter #2 grams with each snack - NO changes to pump settings - She enters  #12 units with a full breakfast, #14 for lunch and supper  -Patient has been noted with blurry vision, she is up-to-date on her eye exam, she is concerned that it may be that Ozempic, I did offer to reduce Ozempic to 1 mg but she would like to remain on the current dose for now -Patient understands that she will not be entering any carbohydrates while on liquid diet or n.p.o. for colonoscopy, she will remain on her current basal rate of insulin    MEDICATIONS: - Continue Metformin 1000 mg,  1 tablet Twice daily  - Continue Ozempic 2 mg weekly  - Continue Farxiga 10 mg daily  - Novolog per pump   EDUCATION / INSTRUCTIONS: BG monitoring instructions: Patient is instructed to check her blood sugars 2 times a day, fasting and bedtime . Call [facility] if: BG persistently < 70  I reviewed the Rule of 15 for the treatment of hypoglycemia in detail with the patient. Literature supplied.   2. Dyslipidemia :   She was on Atrovastatin  at somepoint but developed leg pains and we switched to Rosuvastatin    Medication  Continue rosuvastatin 10 mg daily  Continue Zetia 10 mg daily     F/U in  6 months    Signed electronically by: Mack Guise, MD  [facility]  [facility] [address] [place], [place] [address] Phone: [phone] FAX: [phone]   CC: [address], [place] [address] Phone: [phone]  Fax: [phone]  Return to Endocrinology clinic as below: Future Appointments  Date Time Provider [place]  03/08/2022  9:30 AM Amadou Katzenstein, Melanie Crazier, MD [facility] None  03/08/2022 10:45 AM Nicholas Lose, MD [facility] None  03/11/2022  7:30 AM [facility]-NM 1 [facility]-NM [place]  03/15/2022  3:00 PM Deloria Lair, NP [facility] None  04/06/2022  9:30 AM Ladene Artist, MD [facility]  11/29/2022  9:00 AM Deneise Lever, MD [facility] None

## 2022-03-08 ENCOUNTER — Encounter: Payer: Self-pay | Admitting: Internal Medicine

## 2022-03-08 ENCOUNTER — Ambulatory Visit: Payer: PPO | Admitting: Internal Medicine

## 2022-03-08 ENCOUNTER — Other Ambulatory Visit: Payer: Self-pay

## 2022-03-08 ENCOUNTER — Inpatient Hospital Stay: Payer: PPO | Attending: Hematology and Oncology | Admitting: Hematology and Oncology

## 2022-03-08 VITALS — BP 122/76 | HR 85 | Ht 64.0 in | Wt 245.0 lb

## 2022-03-08 DIAGNOSIS — C50411 Malignant neoplasm of upper-outer quadrant of right female breast: Secondary | ICD-10-CM | POA: Insufficient documentation

## 2022-03-08 DIAGNOSIS — Z79811 Long term (current) use of aromatase inhibitors: Secondary | ICD-10-CM | POA: Insufficient documentation

## 2022-03-08 DIAGNOSIS — E1140 Type 2 diabetes mellitus with diabetic neuropathy, unspecified: Secondary | ICD-10-CM

## 2022-03-08 DIAGNOSIS — Z794 Long term (current) use of insulin: Secondary | ICD-10-CM | POA: Diagnosis not present

## 2022-03-08 DIAGNOSIS — Z923 Personal history of irradiation: Secondary | ICD-10-CM | POA: Insufficient documentation

## 2022-03-08 DIAGNOSIS — Z170 Estrogen receptor positive status [ER+]: Secondary | ICD-10-CM | POA: Diagnosis not present

## 2022-03-08 DIAGNOSIS — E1165 Type 2 diabetes mellitus with hyperglycemia: Secondary | ICD-10-CM | POA: Diagnosis not present

## 2022-03-08 DIAGNOSIS — E785 Hyperlipidemia, unspecified: Secondary | ICD-10-CM

## 2022-03-08 LAB — POCT GLYCOSYLATED HEMOGLOBIN (HGB A1C): Hemoglobin A1C: 7.8 % — AB (ref 4.0–5.6)

## 2022-03-08 NOTE — Assessment & Plan Note (Addendum)
02/16/2021:Screening mammogram: possible asymmetry in the right breast. Diagnostic mammogram and [place]: 0.7 cm suspicious mass in the right breast at 10:00. 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, low-grade DCIS, margins negative, 0/5 lymph nodes negative ER greater than 95%, PR 40%, HER2 negative, Ki-67 5%  Oncotype DX recurrence score: 27, distant recurrence at 9 years: 16%  Treatment plan: 1.Adjuvant chemotherapy with Taxotere and Cytoxan every 3 weeks x4 cycles (patient refused) 2.adjuvant radiation completed 05/27/21 3.Followed by adjuvant antiestrogen therapy with letrozole started 05/24/2021 -------------------------------------------------------------------------------------------------------------------------- Letrozole toxicities: Tolerating it well without any major problems.  Denies any major hot flashes.  She does have some joint stiffness.  Breast cancer surveillance: 1.  Breast exam 03/08/2022: Benign 2. mammogram 02/07/2022: Benign breast density category A

## 2022-03-08 NOTE — Patient Instructions (Addendum)
-   Enter 12 grams with Breakfast, 14 grams with Lunch and Supper  - Continue Metformin 1 tablet Twice daily  - Continue  Ozempic 2  mg weekly  - Continue  Farxiga 10 mg , 1 tablet with breakfast     HOW TO TREAT LOW BLOOD SUGARS (Blood sugar LESS THAN 70 MG/DL) Please follow the RULE OF 15 for the treatment of hypoglycemia treatment (when your (blood sugars are less than 70 mg/dL)   STEP 1: Take 15 grams of carbohydrates when your blood sugar is low, which includes:  3-4 GLUCOSE TABS  OR 3-4 OZ OF JUICE OR REGULAR SODA OR ONE TUBE OF GLUCOSE GEL    STEP 2: RECHECK blood sugar in 15 MINUTES STEP 3: If your blood sugar is still low at the 15 minute recheck --> then, go back to STEP 1 and treat AGAIN with another 15 grams of carbohydrates.

## 2022-03-09 ENCOUNTER — Telehealth: Payer: Self-pay | Admitting: Hematology and Oncology

## 2022-03-09 DIAGNOSIS — E119 Type 2 diabetes mellitus without complications: Secondary | ICD-10-CM | POA: Diagnosis not present

## 2022-03-09 DIAGNOSIS — R892 Abnormal level of other drugs, medicaments and biological substances in specimens from other organs, systems and tissues: Secondary | ICD-10-CM | POA: Diagnosis not present

## 2022-03-09 DIAGNOSIS — E559 Vitamin D deficiency, unspecified: Secondary | ICD-10-CM | POA: Diagnosis not present

## 2022-03-09 DIAGNOSIS — I10 Essential (primary) hypertension: Secondary | ICD-10-CM | POA: Diagnosis not present

## 2022-03-09 DIAGNOSIS — Z23 Encounter for immunization: Secondary | ICD-10-CM | POA: Diagnosis not present

## 2022-03-09 DIAGNOSIS — M5136 Other intervertebral disc degeneration, lumbar region: Secondary | ICD-10-CM | POA: Diagnosis not present

## 2022-03-09 DIAGNOSIS — Z79899 Other long term (current) drug therapy: Secondary | ICD-10-CM | POA: Diagnosis not present

## 2022-03-09 DIAGNOSIS — M25552 Pain in left hip: Secondary | ICD-10-CM | POA: Diagnosis not present

## 2022-03-09 NOTE — Telephone Encounter (Signed)
Scheduled appointment per 9/26 los. Left voicemail.

## 2022-03-11 ENCOUNTER — Encounter (HOSPITAL_COMMUNITY)
Admission: RE | Admit: 2022-03-11 | Discharge: 2022-03-11 | Disposition: A | Discharge status: Home / Self Care | Payer: PPO | Source: Ambulatory Visit | Attending: Physician Assistant | Admitting: Physician Assistant

## 2022-03-11 DIAGNOSIS — Z79899 Other long term (current) drug therapy: Secondary | ICD-10-CM | POA: Diagnosis not present

## 2022-03-11 DIAGNOSIS — R1013 Epigastric pain: Secondary | ICD-10-CM | POA: Diagnosis not present

## 2022-03-11 MED ORDER — TECHNETIUM TC 99M MEBROFENIN IV KIT
5.4000 | PACK | Freq: Once | INTRAVENOUS | Status: AC
  Administered 2022-03-11: 5.4 via INTRAVENOUS

## 2022-03-15 ENCOUNTER — Encounter: Payer: Self-pay | Admitting: *Deleted

## 2022-03-15 ENCOUNTER — Telehealth: Payer: Self-pay | Admitting: *Deleted

## 2022-03-15 NOTE — Patient Outreach (Signed)
  Care Coordination   Follow Up Visit Note   03/15/2022 Name: [name] MRN: [id number] DOB: Feb 17, 1961  [name] is a 61 y.o. year old female who sees Sagardia, Ines Bloomer, MD for primary care. I spoke with  [name] by phone today.  What matters to the patients health and wellness today?  Getting more serious about reaching my goal.    Goals Addressed               This Visit's Progress     Patient Stated     Improve my diet by carb counting to help manage my DM and lose weight. (pt-stated)        Care Coordination Interventions: Assessed pt progress: Pt is gaining interest in learning how to count carbs. She has not written meals down but she was able to tell me what she had for breakfast which exceeded 100 Gms. Reviewed meal content and what each carb consumed was worth. Reviewed desired meal and snack content (45 and 15 respectively). Reviewed how to read a label: serving size emphasized and amount of carbs per serving. Pt to check her granola for same and report next call. Advise NP or MD if having consistent low sugar readings after reduction in carb intake which is what we expect. Reduce carbs, reduce glucose readinigs, reduce weight and will be able to reduce insulin and perhaps other 3 diabetes meds.         SDOH assessments and interventions completed:  No   Care Coordination Interventions Activated:  Yes  Care Coordination Interventions:  Yes, provided   Follow up plan: Follow up call scheduled for 2 weeks.    Encounter Outcome:  Pt. Visit Completed   Kayleen Memos C. Myrtie Neither, MSN, [facility] Gerontological Nurse Practitioner [facility] Care Management [phone]

## 2022-03-20 ENCOUNTER — Ambulatory Visit (HOSPITAL_COMMUNITY)
Admission: EM | Admit: 2022-03-20 | Discharge: 2022-03-20 | Disposition: A | Discharge status: Home / Self Care | Payer: PPO | Attending: Internal Medicine | Admitting: Internal Medicine

## 2022-03-20 ENCOUNTER — Encounter (HOSPITAL_COMMUNITY): Payer: Self-pay | Admitting: *Deleted

## 2022-03-20 ENCOUNTER — Other Ambulatory Visit: Payer: Self-pay

## 2022-03-20 ENCOUNTER — Ambulatory Visit (INDEPENDENT_AMBULATORY_CARE_PROVIDER_SITE_OTHER): Payer: PPO

## 2022-03-20 DIAGNOSIS — M25522 Pain in left elbow: Secondary | ICD-10-CM

## 2022-03-20 DIAGNOSIS — M25429 Effusion, unspecified elbow: Secondary | ICD-10-CM | POA: Diagnosis not present

## 2022-03-20 DIAGNOSIS — M25529 Pain in unspecified elbow: Secondary | ICD-10-CM | POA: Diagnosis not present

## 2022-03-20 DIAGNOSIS — M25422 Effusion, left elbow: Secondary | ICD-10-CM | POA: Diagnosis not present

## 2022-03-20 MED ORDER — PREDNISONE 10 MG PO TABS: 10.0000 mg | ORAL_TABLET | Freq: Every day | ORAL | 0 refills | Status: DC

## 2022-03-20 NOTE — Discharge Instructions (Addendum)
You were seen today for elbow pain and swelling.  Your x-ray does not show any evidence of fracture.  I am treating you for an inflammatory condition with prednisone 10 mg daily for the next 5 days.  Monitor your sugars and expect elevations in the next few days.  We have placed you in a sling for comfort.  You can wear this during the day and take this off at night.  I encouraged ice and elevate your elbow.  Please follow-up with your PCP if symptoms persist or worsen.

## 2022-03-20 NOTE — ED Provider Notes (Signed)
[name]    CSN: [id number] Arrival date & time: 03/20/22  1013      History   Chief Complaint Chief Complaint  Patient presents with   Joint Swelling    HPI [name] is a 61 y.o. female with a history of GERD, DM 2, HLD with neuropathy, HLD, HTN, COPD, OSA and history of chronic pain presents to the urgent care with complaint of left elbow pain.  She reports this started 2 days ago.  The pain is constant.  She describes the pain as sharp and stabbing.  The pain is worse with movement.  She has noticed associated swelling.  She denies numbness, tingling or weakness of her left upper extremity.  She denies any injury to the area.  She has no history of gout.  She has tried Tylenol OTC with minimal relief of symptoms.  HPI  Past Medical History:  Diagnosis Date   Allergy    Arthritis    back    Asthma    AS CHILD   Cancer ([place])    breast CA- Right,   Chronic back pain    Chronic leg pain    due to back pain   Cocaine abuse ([place])    in remission   COPD (chronic obstructive pulmonary disease) ([place])    Diabetes ([place])    with neuropathy   GERD (gastroesophageal reflux disease)    Hyperlipidemia    Hypertension    Internal hemorrhoids    Intraductal papilloma of left breast 02/18/2016   Neuromuscular disorder (HCC)    neuropathy   Personal history of radiation therapy    Postlaminectomy syndrome of lumbar region 12/07/2011   RECTAL BLEEDING 12/09/2008   Annotation: s/p EGD 7/08 mild gastritis, s/p colonoscopy 7/08- benign polyp  s/p polypectomy and isolated diverticulum.  Qualifier: Diagnosis of  By: Ditzler RN, Debra     Sciatica    per patient    Sleep apnea    CPAP    YEARS AGO DONE 1/2 YEARS AGO AND WAS TOLD DID NOT HAVE   Tobacco abuse    Tubular adenoma of colon    Uterine fibroid    s/p hysterectomy    Patient Active Problem List   Diagnosis Date Noted   Malignant neoplasm of upper-outer quadrant of right breast in female, estrogen  receptor positive ([place]) 02/22/2021   Pre-operative respiratory examination 09/04/2020   Body mass index (BMI) 40.0-44.9, adult ([place]) 07/28/2020   Lumbago with sciatica, right side 07/28/2020   Elevated CK 07/11/2020   Dyspnea on exertion 07/11/2020   Fracture of base of fifth metatarsal bone of right foot at metaphyseal-diaphyseal junction with nonunion 05/07/2019   OSA on CPAP 10/31/2018   Stress incontinence in female 03/09/2017   Hepatic steatosis 08/29/2016   Herpes simplex 03/08/2016   Diabetic neuropathy with neurologic complication ([place]) 74/05/8785   Long term current use of opiate analgesic 07/21/2015   Preventative health care 05/21/2015   COPD GOLD II  05/31/2013   Lung nodule < 6cm on CT 05/31/2013   Chronic cough 12/07/2012   Chronic pain syndrome 02/13/2012   Lumbar radiculopathy 11/29/2011   Type 2 diabetes mellitus with diabetic neuropathy ([place]) 08/25/2011   Back pain 11/15/2010   Hypertension associated with diabetes ([place]) 07/07/2009   GERD 01/08/2009   Dyslipidemia associated with type 2 diabetes mellitus ([place]) 12/09/2008   Hyperlipidemia 12/09/2008   Morbid obesity due to excess calories ([place]) 12/09/2008    Past Surgical History:  Procedure  Laterality Date   BACK SURGERY  2012   L5-S1 microendoscopic disectomy last surgery 06/2011   BREAST BIOPSY     LEFT    01/19/16   BREAST BIOPSY Right 02/16/2021   BREAST EXCISIONAL BIOPSY Left 02/2016   BREAST LUMPECTOMY Right 03/12/2021   BREAST LUMPECTOMY WITH RADIOACTIVE SEED AND SENTINEL LYMPH NODE BIOPSY Right 03/12/2021   Procedure: RIGHT BREAST LUMPECTOMY WITH RADIOACTIVE SEED AND SENTINEL LYMPH NODE BIOPSY;  Surgeon: Jovita Kussmaul, MD;  Location: [facility];  Service: General;  Laterality: Right;   BREAST REDUCTION SURGERY  1982   CATARACT EXTRACTION Right 11/07/2019   COLONOSCOPY     HAND SURGERY     MIDDLE TRIGGER FINGER RIGHT SIDE   RADIOACTIVE SEED GUIDED EXCISIONAL BREAST BIOPSY Left 02/18/2016   Procedure:  LEFT RADIOACTIVE SEED GUIDED EXCISIONAL BREAST BIOPSY;  Surgeon: Alphonsa Overall, MD;  Location: [facility];  Service: General;  Laterality: Left;   REDUCTION MAMMAPLASTY Bilateral    TONSILLECTOMY  2008   TOTAL ABDOMINAL HYSTERECTOMY  05/24/2007   hysterectomy   TUBAL LIGATION     UPPER GASTROINTESTINAL ENDOSCOPY      OB History     Gravida  5   Para      Term      Preterm      AB  2   Living  3      SAB      IAB  2   Ectopic      Multiple      Live Births               Home Medications    Prior to Admission medications   Medication Sig Start Date End Date Taking? Authorizing Provider  predniSONE (DELTASONE) 10 MG tablet Take 1 tablet (10 mg total) by mouth daily with breakfast. 03/20/22  Yes Jesson Foskey, Coralie Keens, NP  acyclovir (ZOVIRAX) 400 MG tablet TAKE 1 TABLET BY MOUTH THREE TIMES DAILY AS NEEDED (FOR  FLARES) 10/27/20   Jose Persia, MD  albuterol (VENTOLIN HFA) 108 (90 Base) MCG/ACT inhaler Inhale 2 puffs into the lungs every 6 (six) hours as needed for wheezing or shortness of breath. 05/28/20   Young, Kasandra Knudsen, MD  AMBULATORY NON FORMULARY MEDICATION Take 30 mLs by mouth as needed (Severe abdominal pain). Medication Name: GI Cocktail: 270 mLViscous Xylocain 2%, 270 mL Dicyclomione10 mg/5 mL, 810 mL Mylanta or Maylox, 02/04/22   Esterwood, Amy S, PA-C  BELBUCA 300 MCG FILM Take 1 strip by mouth 2 (two) times daily. 11/25/21   [provider]  Blood Glucose Monitoring Suppl ([place]) w/Device KIT 1 each by Does not apply route 3 (three) times daily. 01/12/17   Shela Leff, MD  budesonide-formoterol (SYMBICORT) 160-4.5 MCG/ACT inhaler Inhale 2 puffs into the lungs 2 (two) times daily. 01/05/22   Deneise Lever, MD  Continuous Blood Gluc Sensor (FREESTYLE LIBRE 2 SENSOR) MISC Change sensor every 14 days 11/16/21   Shamleffer, Melanie Crazier, MD  Continuous Glucose Monitor DEVI Use as directed. 03/28/17   Shela Leff, MD   dapagliflozin propanediol (FARXIGA) 10 MG TABS tablet Take 1 tablet (10 mg total) by mouth daily. 11/16/21   Shamleffer, Melanie Crazier, MD  diclofenac Sodium (VOLTAREN) 1 % GEL Apply 2 g topically 4 (four) times daily. Rub into affected area of foot 2 to 4 times daily Patient taking differently: Apply 2 g topically 4 (four) times daily. Rub into affected area of foot 2 to 4 times daily-  uses PRN 01/14/22   Trula Slade, DPM  dicyclomine (BENTYL) 10 MG capsule TAKE 1 CAPSULE BY MOUTH THREE TIMES DAILY BEFORE MEAL(S) 02/04/22   Esterwood, Amy S, PA-C  esomeprazole (NEXIUM) 40 MG capsule Take 1 capsule (40 mg total) by mouth 2 (two) times daily before a meal. Take 30 minutes before breakfast and dinner. 02/04/22   Esterwood, Amy S, PA-C  ezetimibe (ZETIA) 10 MG tablet Take 1 tablet (10 mg total) by mouth daily. 09/22/20 02/04/22  Harvie Heck, MD  famotidine (PEPCID) 20 MG tablet Take 1 tablet (20 mg total) by mouth 2 (two) times daily as needed. 02/04/22   Esterwood, Amy S, PA-C  gabapentin (NEURONTIN) 600 MG tablet Take 1 tablet (600 mg total) by mouth 2 (two) times daily. 11/09/21   Horald Pollen, MD  glucose blood (FREESTYLE TEST STRIPS) test strip 1 each by Other route 3 (three) times daily. 09/06/21   Shamleffer, Melanie Crazier, MD  hydrochlorothiazide (HYDRODIURIL) 25 MG tablet Take 1 tablet (25 mg total) by mouth daily. 12/21/21   Gardenia Phlegm, NP  insulin aspart (NOVOLOG) 100 UNIT/ML injection Max daily 120 units daily 05/05/21   Shamleffer, Melanie Crazier, MD  Insulin Disposable Pump (OMNIPOD DASH INTRO, GEN 4,) KIT 1 Device by Does not apply route every 3 (three) days. 01/24/22   Shamleffer, Melanie Crazier, MD  Insulin Syringe-Needle U-100 (INSULIN SYRINGE 1CC/30GX1/2") 30G X 1/2" 1 ML MISC Use to fill Vgo daily 11/06/18   Dorrell, Andree Elk, MD  letrozole [facility]) 2.5 MG tablet Take 1 tablet (2.5 mg total) by mouth daily. 06/13/21   Nicholas Lose, MD  metFORMIN (GLUCOPHAGE)  1000 MG tablet Take 1 tablet (1,000 mg total) by mouth 2 (two) times daily with a meal. 01/19/22   Shamleffer, Melanie Crazier, MD  montelukast (SINGULAIR) 10 MG tablet Take 1 tablet (10 mg total) by mouth at bedtime. 12/03/21   Martyn Ehrich, NP  Multiple Vitamin (MULTIVITAMIN WITH MINERALS) TABS tablet Take 1 tablet by mouth daily.    [provider]  NARCAN 4 MG/0.1ML LIQD nasal spray kit Place 0.4 mg into the nose once. 12/06/18   [provider]  Jonetta Speak LANCETS FINE MISC Check blood sugar 3 times a day 06/22/16   Shela Leff, MD  oxyCODONE-acetaminophen (PERCOCET) 10-325 MG tablet Take 1 tablet by mouth 4 (four) times daily as needed for pain. 03/02/20   [provider]  rosuvastatin (CRESTOR) 10 MG tablet Take 1 tablet (10 mg total) by mouth daily. 11/27/20   Shamleffer, Melanie Crazier, MD  Semaglutide, 2 MG/DOSE, (OZEMPIC, 2 MG/DOSE,) 8 MG/3ML SOPN Inject 2 mg into the skin once a week. 11/16/21   Shamleffer, Melanie Crazier, MD  tiZANidine (ZANAFLEX) 4 MG capsule Take 4 mg by mouth 3 (three) times daily as needed. 07/23/21   [provider]  valACYclovir (VALTREX) 500 MG tablet TAKE 1 TABLET BY MOUTH TWICE DAILY FOR 5 DAYS AT  TIME  OF  OUTBREAK 07/10/20   Harvie Heck, MD    Family History Family History  Problem Relation Age of Onset   Diabetes Father    Heart disease Father    Hypertension Father    Diabetes Mother    Cancer Mother        brain   Hypertension Mother    Kidney disease Sister    Diabetes Sister    Kidney cancer Sister    Stroke Sister    Diabetes Sister    Hypertension Sister  Diabetes Sister    Hypertension Sister    Obesity Son    Colon cancer Neg Hx    Colon polyps Neg Hx    Esophageal cancer Neg Hx    Rectal cancer Neg Hx    Stomach cancer Neg Hx     Social History Social History   Tobacco Use   Smoking status: Former    Packs/day: 0.50    Years: 20.00    Total pack years: 10.00    Types:  Cigarettes    Quit date: 07/23/1996    Years since quitting: 25.6   Smokeless tobacco: Never  Vaping Use   Vaping Use: Never used  Substance Use Topics   Alcohol use: Not Currently    Comment: recovering addict clean for 9 years   Drug use: Not Currently    Types: Cocaine, Marijuana    Comment: recovering addict clean for 9 years     Allergies   Breo ellipta [fluticasone furoate-vilanterol] and Lisinopril   Review of Systems Review of Systems   Past Medical History:  Diagnosis Date   Allergy    Arthritis    back    Asthma    AS CHILD   Cancer ([place])    breast CA- Right,   Chronic back pain    Chronic leg pain    due to back pain   Cocaine abuse ([place])    in remission   COPD (chronic obstructive pulmonary disease) ([place])    Diabetes ([place])    with neuropathy   GERD (gastroesophageal reflux disease)    Hyperlipidemia    Hypertension    Internal hemorrhoids    Intraductal papilloma of left breast 02/18/2016   Neuromuscular disorder (HCC)    neuropathy   Personal history of radiation therapy    Postlaminectomy syndrome of lumbar region 12/07/2011   RECTAL BLEEDING 12/09/2008   Annotation: s/p EGD 7/08 mild gastritis, s/p colonoscopy 7/08- benign polyp  s/p polypectomy and isolated diverticulum.  Qualifier: Diagnosis of  By: Ditzler RN, Debra     Sciatica    per patient    Sleep apnea    CPAP    YEARS AGO DONE 1/2 YEARS AGO AND WAS TOLD DID NOT HAVE   Tobacco abuse    Tubular adenoma of colon    Uterine fibroid    s/p hysterectomy    No current facility-administered medications for this encounter.   Current Outpatient Medications  Medication Sig Dispense Refill   predniSONE (DELTASONE) 10 MG tablet Take 1 tablet (10 mg total) by mouth daily with breakfast. 5 tablet 0   acyclovir (ZOVIRAX) 400 MG tablet TAKE 1 TABLET BY MOUTH THREE TIMES DAILY AS NEEDED (FOR  FLARES) 90 tablet 0   albuterol (VENTOLIN HFA) 108 (90 Base) MCG/ACT inhaler Inhale 2 puffs into the  lungs every 6 (six) hours as needed for wheezing or shortness of breath. 18 g 12   AMBULATORY NON FORMULARY MEDICATION Take 30 mLs by mouth as needed (Severe abdominal pain). Medication Name: GI Cocktail: 270 mLViscous Xylocain 2%, 270 mL Dicyclomione10 mg/5 mL, 810 mL Mylanta or Maylox, 1350 mL 0   BELBUCA 300 MCG FILM Take 1 strip by mouth 2 (two) times daily.     Blood Glucose Monitoring Suppl (ONETOUCH VERIO FLEX SYSTEM) w/Device KIT 1 each by Does not apply route 3 (three) times daily. 1 kit 1   budesonide-formoterol (SYMBICORT) 160-4.5 MCG/ACT inhaler Inhale 2 puffs into the lungs 2 (two) times daily. 1 each 6  Continuous Blood Gluc Sensor (FREESTYLE LIBRE 2 SENSOR) MISC Change sensor every 14 days 6 each 3   Continuous Glucose Monitor DEVI Use as directed. 1 each 0   dapagliflozin propanediol (FARXIGA) 10 MG TABS tablet Take 1 tablet (10 mg total) by mouth daily. 90 tablet 0   diclofenac Sodium (VOLTAREN) 1 % GEL Apply 2 g topically 4 (four) times daily. Rub into affected area of foot 2 to 4 times daily (Patient taking differently: Apply 2 g topically 4 (four) times daily. Rub into affected area of foot 2 to 4 times daily- uses PRN) 100 g 2   dicyclomine (BENTYL) 10 MG capsule TAKE 1 CAPSULE BY MOUTH THREE TIMES DAILY BEFORE MEAL(S) 90 capsule 11   esomeprazole (NEXIUM) 40 MG capsule Take 1 capsule (40 mg total) by mouth 2 (two) times daily before a meal. Take 30 minutes before breakfast and dinner. 60 capsule 11   ezetimibe (ZETIA) 10 MG tablet Take 1 tablet (10 mg total) by mouth daily. 30 tablet 11   famotidine (PEPCID) 20 MG tablet Take 1 tablet (20 mg total) by mouth 2 (two) times daily as needed. 60 tablet 11   gabapentin (NEURONTIN) 600 MG tablet Take 1 tablet (600 mg total) by mouth 2 (two) times daily. 180 tablet 1   glucose blood (FREESTYLE TEST STRIPS) test strip 1 each by Other route 3 (three) times daily. 300 each 3   hydrochlorothiazide (HYDRODIURIL) 25 MG tablet Take 1 tablet  (25 mg total) by mouth daily. 90 tablet 1   insulin aspart (NOVOLOG) 100 UNIT/ML injection Max daily 120 units daily 110 mL 3   Insulin Disposable Pump (OMNIPOD DASH INTRO, GEN 4,) KIT 1 Device by Does not apply route every 3 (three) days. 1 kit 0   Insulin Syringe-Needle U-100 (INSULIN SYRINGE 1CC/30GX1/2") 30G X 1/2" 1 ML MISC Use to fill Vgo daily 100 each 5   letrozole (FEMARA) 2.5 MG tablet Take 1 tablet (2.5 mg total) by mouth daily. 90 tablet 3   metFORMIN (GLUCOPHAGE) 1000 MG tablet Take 1 tablet (1,000 mg total) by mouth 2 (two) times daily with a meal. 180 tablet 0   montelukast (SINGULAIR) 10 MG tablet Take 1 tablet (10 mg total) by mouth at bedtime. 30 tablet 11   Multiple Vitamin (MULTIVITAMIN WITH MINERALS) TABS tablet Take 1 tablet by mouth daily.     NARCAN 4 MG/0.1ML LIQD nasal spray kit Place 0.4 mg into the nose once.     ONETOUCH DELICA LANCETS FINE MISC Check blood sugar 3 times a day 100 each 12   oxyCODONE-acetaminophen (PERCOCET) 10-325 MG tablet Take 1 tablet by mouth 4 (four) times daily as needed for pain.     rosuvastatin (CRESTOR) 10 MG tablet Take 1 tablet (10 mg total) by mouth daily. 90 tablet 3   Semaglutide, 2 MG/DOSE, (OZEMPIC, 2 MG/DOSE,) 8 MG/3ML SOPN Inject 2 mg into the skin once a week. 9 mL 3   tiZANidine (ZANAFLEX) 4 MG capsule Take 4 mg by mouth 3 (three) times daily as needed.     valACYclovir (VALTREX) 500 MG tablet TAKE 1 TABLET BY MOUTH TWICE DAILY FOR 5 DAYS AT  TIME  OF  OUTBREAK 10 tablet 0    Allergies  Allergen Reactions   Breo Ellipta [Fluticasone Furoate-Vilanterol] Other (See Comments)    Urinary retention   Lisinopril Cough    Family History  Problem Relation Age of Onset   Diabetes Father    Heart disease Father  Hypertension Father    Diabetes Mother    Cancer Mother        brain   Hypertension Mother    Kidney disease Sister    Diabetes Sister    Kidney cancer Sister    Stroke Sister    Diabetes Sister    Hypertension  Sister    Diabetes Sister    Hypertension Sister    Obesity Son    Colon cancer Neg Hx    Colon polyps Neg Hx    Esophageal cancer Neg Hx    Rectal cancer Neg Hx    Stomach cancer Neg Hx     Social History   Socioeconomic History   Marital status: Married    Spouse name: Not on file   Number of children: 3   Years of education: Not on file   Highest education level: Not on file  Occupational History    Comment: [employer]  Tobacco Use   Smoking status: Former    Packs/day: 0.50    Years: 20.00    Total pack years: 10.00    Types: Cigarettes    Quit date: 07/23/1996    Years since quitting: 25.6   Smokeless tobacco: Never  Vaping Use   Vaping Use: Never used  Substance and Sexual Activity   Alcohol use: Not Currently    Comment: recovering addict clean for 9 years   Drug use: Not Currently    Types: Cocaine, Marijuana    Comment: recovering addict clean for 9 years   Sexual activity: Yes    Birth control/protection: Surgical  Other Topics Concern   Not on file  Social History Narrative   Current Social History 01/17/2020        Patient lives with spouse in a home which is 1 story. There are not steps up to the entrance the patient uses.       Patient's method of transportation is personal car.      The highest level of education was some college.      The patient currently works part-time.      Identified important Relationships are God, husband, kids, grandkids       Pets : None       Interests / Fun: Drawing and nature       Current Stressors: Pain, Covid       Religious / Personal Beliefs: Christian       Other: None    Social Determinants of Health   Financial Resource Strain: Low Risk  (02/18/2022)   Overall Financial Resource Strain (CARDIA)    Difficulty of Paying Living Expenses: Not hard at all  Food Insecurity: No Food Insecurity (02/18/2022)   Hunger Vital Sign    Worried About Running Out of Food in the Last Year: Never true    [place] in the Last Year: Never true  Transportation Needs: No Transportation Needs (02/18/2022)   PRAPARE - Hydrologist (Medical): No    Lack of Transportation (Non-Medical): No  Physical Activity: Insufficiently Active (02/18/2022)   Exercise Vital Sign    Days of Exercise per Week: 2 days    Minutes of Exercise per Session: 30 min  Stress: No Stress Concern Present (02/18/2022)   [place]    Feeling of Stress : Not at all  Social Connections: Moderately Integrated (02/18/2022)   Social Connection and Isolation Panel [NHANES]  Frequency of Communication with Friends and Family: More than three times a week    Frequency of Social Gatherings with Friends and Family: More than three times a week    Attends Religious Services: More than 4 times per year    Active Member of [employer] or Organizations: No    Attends Archivist Meetings: Never    Marital Status: Married  Human resources officer Violence: Not At Risk (02/18/2022)   Humiliation, Afraid, Rape, and Kick questionnaire    Fear of Current or Ex-Partner: No    Emotionally Abused: No    Physically Abused: No    Sexually Abused: No     Constitutional: Denies fever, malaise, fatigue, headache or abrupt weight changes.  Respiratory: Denies difficulty breathing, shortness of breath, cough or sputum production.   Cardiovascular: Denies chest pain, chest tightness, palpitations or swelling in the hands or feet.  Musculoskeletal: Patient reports left elbow pain and swelling, decrease in range of motion.  Denies difficulty with gait, muscle pain.  Skin: Denies redness, rashes, lesions or ulcercations.  Neurological: Denies numbness, tingling, weakness of left upper extremity or problems with coordination.    No other specific complaints in a complete review of systems (except as listed in HPI above).    Physical Exam Triage Vital Signs ED  Triage Vitals  Enc Vitals Group     BP 03/20/22 1034 (!) 151/88     Pulse Rate 03/20/22 1034 83     Resp 03/20/22 1034 20     Temp 03/20/22 1034 99.2 F (37.3 C)     Temp src --      SpO2 03/20/22 1034 95 %     Weight --      Height --      Head Circumference --      Peak Flow --      Pain Score 03/20/22 1032 10     Pain Loc --      Pain Edu? --      Excl. in [place]? --    No data found.  Updated Vital Signs BP (!) 151/88   Pulse 83   Temp 99.2 F (37.3 C)   Resp 20   SpO2 95%     Physical Exam  BP (!) 151/88   Pulse 83   Temp 99.2 F (37.3 C)   Resp 20   SpO2 95%  Wt Readings from Last 3 Encounters:  03/15/22 239 lb (108.4 kg)  03/08/22 244 lb 9.6 oz (110.9 kg)  03/08/22 245 lb (111.1 kg)    General: Appears her stated age, obese, chronically ill-appearing, in NAD. Skin: Warm, dry and intact.  Right redness and warmth noted of the left elbow. Cardiovascular: Normal rate and rhythm.  Radial pulse 2+ on the left. Pulmonary/Chest: Normal effort and positive vesicular breath sounds. No respiratory distress. No wheezes, rales or ronchi noted.  Musculoskeletal: Decreased active flexion, extension and rotation of the left elbow.  She refuses passive range of motion of the left elbow.  Trace swelling noted over the lateral elbow.  Pain with palpation over the right epicondyle.  Handgrips unequal L< R. Neurological: Alert and oriented.  Fine motor coordination of the left hand normal.  BMET    Component Value Date/Time   NA 141 02/04/2022 1033   NA 143 07/10/2020 1021   K 3.3 (L) 02/04/2022 1033   CL 96 02/04/2022 1033   CO2 34 (H) 02/04/2022 1033   GLUCOSE 152 (H) 02/04/2022 1033   BUN 10  02/04/2022 1033   BUN 9 07/10/2020 1021   CREATININE 0.72 02/04/2022 1033   CREATININE 0.96 02/24/2021 1222   CREATININE 0.67 02/28/2019 1303   CALCIUM 9.9 02/04/2022 1033   GFRNONAA >60 02/24/2021 1222   GFRNONAA 97 02/28/2019 1303   GFRAA 110 07/10/2020 1021   GFRAA 112  02/28/2019 1303    Lipid Panel     Component Value Date/Time   CHOL 131 02/19/2021 0913   CHOL 285 (H) 09/15/2020 1422   TRIG 111.0 02/19/2021 0913   HDL 34.50 (L) 02/19/2021 0913   HDL 39 (L) 09/15/2020 1422   CHOLHDL 4 02/19/2021 0913   VLDL 22.2 02/19/2021 0913   LDLCALC 74 02/19/2021 0913   LDLCALC 202 (H) 09/15/2020 1422    CBC    Component Value Date/Time   WBC 4.7 02/04/2022 1033   RBC 5.07 02/04/2022 1033   HGB 13.6 02/04/2022 1033   HGB 15.2 (H) 02/24/2021 1222   HCT 41.6 02/04/2022 1033   PLT 209.0 02/04/2022 1033   PLT 269 02/24/2021 1222   MCV 82.1 02/04/2022 1033   MCH 27.3 02/24/2021 1222   MCHC 32.7 02/04/2022 1033   RDW 15.2 02/04/2022 1033   LYMPHSABS 1.6 02/04/2022 1033   MONOABS 0.4 02/04/2022 1033   EOSABS 0.2 02/04/2022 1033   BASOSABS 0.1 02/04/2022 1033    Hgb A1C Lab Results  Component Value Date   HGBA1C 7.8 (A) 03/08/2022       UC Treatments / Results   Radiology  Imaging Orders         DG Elbow Complete Left     IMPRESSION: No acute osseous findings or evidence of joint effusion. Mild degenerative changes.  Medications Ordered in UC Medications - No data to display  Initial Impression / Assessment and Plan / UC Course  I have reviewed the triage vital signs and the nursing notes.  Pertinent labs & imaging results that were available during my care of the patient were reviewed by me and considered in my medical decision making (see chart for details).    Left Elbow Pain and Swelling:  DDx include nontraumatic left elbow fracture, gout, inflammatory arthritis, septic joint X-ray left elbow does not show any evidence of soft tissue swelling or fracture per my read, confirmed by radiology We will treat for inflammatory conditions such as possible gout or arthritis with Prednisone 10 mg x 5 days-monitor sugars and expect elevations for the next week. We will hold off on antibiotics at this time as there are no systemic signs  of infection Sling placed for comfort Encouraged ice and elevation We will have her follow-up with her PCP if symptoms persist or worsen  Final Clinical Impressions(s) / UC Diagnoses   Final diagnoses:  Nontraumatic pain and swelling of elbow     Discharge Instructions      You were seen today for elbow pain and swelling.  Your x-ray does not show any evidence of fracture.  I am treating you for an inflammatory condition with prednisone 10 mg daily for the next 5 days.  Monitor your sugars and expect elevations in the next few days.  We have placed you in a sling for comfort.  You can wear this during the day and take this off at night.  I encouraged ice and elevate your elbow.  Please follow-up with your PCP if symptoms persist or worsen.     ED Prescriptions     Medication Sig Dispense Auth. Provider   predniSONE (DELTASONE)  10 MG tablet Take 1 tablet (10 mg total) by mouth daily with breakfast. 5 tablet Daffney Greenly, Coralie Keens, NP      PDMP not reviewed this encounter.   Jearld Fenton, NP 03/20/22 1113

## 2022-03-20 NOTE — ED Triage Notes (Signed)
Pt reports Lt elbow pain for 2 days. Pt has tried OTC for pain with out relief. Pt denies any injury.

## 2022-03-24 DIAGNOSIS — G4733 Obstructive sleep apnea (adult) (pediatric): Secondary | ICD-10-CM | POA: Diagnosis not present

## 2022-03-28 ENCOUNTER — Encounter: Payer: Self-pay | Admitting: Gastroenterology

## 2022-03-29 ENCOUNTER — Telehealth: Payer: Self-pay | Admitting: *Deleted

## 2022-03-29 ENCOUNTER — Encounter: Payer: Self-pay | Admitting: *Deleted

## 2022-03-29 NOTE — Patient Outreach (Signed)
  Care Coordination   03/30/2022 Name: [name] MRN: [id number] DOB: [birth year]   Care Coordination Outreach Attempts:  An unsuccessful telephone outreach was attempted today to offer the patient information about available care coordination services as a benefit of their health plan.   Follow Up Plan:  Additional outreach attempts will be made to offer the patient care coordination information and services.   Encounter Outcome:  No Answer  Care Coordination Interventions Activated:  No   Care Coordination Interventions:  No, not indicated    SIG [address] C. Myrtie Neither, MSN, [facility] Gerontological Nurse Practitioner [facility] Care Management [phone]

## 2022-03-31 ENCOUNTER — Telehealth: Payer: Self-pay | Admitting: *Deleted

## 2022-03-31 NOTE — Telephone Encounter (Signed)
Estill Bamberg, please reschedule at [facility]. Thx

## 2022-03-31 NOTE — Telephone Encounter (Signed)
Dr. Fuller Plan,  This pt is scheduled with you on 10/25. She is a documented difficult intubation and he rprocedure will need to be done at the hospital.    Regards,  Osvaldo Angst

## 2022-03-31 NOTE — Telephone Encounter (Signed)
Left message for patient to return my call.

## 2022-04-01 ENCOUNTER — Other Ambulatory Visit: Payer: Self-pay | Admitting: Internal Medicine

## 2022-04-04 ENCOUNTER — Other Ambulatory Visit: Payer: Self-pay

## 2022-04-04 DIAGNOSIS — Z86010 Personal history of colonic polyps: Secondary | ICD-10-CM

## 2022-04-04 NOTE — Telephone Encounter (Signed)
Informed patient that she has to have her procedure scheduled at the hospital due to difficult airway. Patient states her last procedure was done at our [facility]. Patient wanted to know which surgery she had a difficult airway. Please advise John. I cannot see in the chart which surgery it was from.

## 2022-04-05 ENCOUNTER — Ambulatory Visit: Payer: Self-pay | Admitting: *Deleted

## 2022-04-05 DIAGNOSIS — E559 Vitamin D deficiency, unspecified: Secondary | ICD-10-CM | POA: Diagnosis not present

## 2022-04-05 DIAGNOSIS — M545 Low back pain, unspecified: Secondary | ICD-10-CM | POA: Diagnosis not present

## 2022-04-05 DIAGNOSIS — R892 Abnormal level of other drugs, medicaments and biological substances in specimens from other organs, systems and tissues: Secondary | ICD-10-CM | POA: Diagnosis not present

## 2022-04-05 DIAGNOSIS — M5136 Other intervertebral disc degeneration, lumbar region: Secondary | ICD-10-CM | POA: Diagnosis not present

## 2022-04-05 DIAGNOSIS — I10 Essential (primary) hypertension: Secondary | ICD-10-CM | POA: Diagnosis not present

## 2022-04-05 DIAGNOSIS — E119 Type 2 diabetes mellitus without complications: Secondary | ICD-10-CM | POA: Diagnosis not present

## 2022-04-05 DIAGNOSIS — Z79899 Other long term (current) drug therapy: Secondary | ICD-10-CM | POA: Diagnosis not present

## 2022-04-05 NOTE — Patient Outreach (Signed)
  Care Coordination   04/05/2022 Name: KJERSTI DITTMER MRN: [id number] DOB: 11-Aug-1960   Care Coordination Outreach Attempts:  An unsuccessful telephone outreach was attempted today to offer the patient information about available care coordination services as a benefit of their health plan.   Follow Up Plan:  Additional outreach attempts will be made to offer the patient care coordination information and services.   Encounter Outcome:  Pt. Request to Call Back  She states she will call NP.  Care Coordination Interventions Activated:  No   Care Coordination Interventions:  No, not indicated    SIG Yarethzi Branan C. Myrtie Neither, MSN, [facility] Gerontological Nurse Practitioner [facility] Care Management [phone]

## 2022-04-06 ENCOUNTER — Encounter: Payer: PPO | Admitting: Gastroenterology

## 2022-04-07 DIAGNOSIS — Z79899 Other long term (current) drug therapy: Secondary | ICD-10-CM | POA: Diagnosis not present

## 2022-04-07 DIAGNOSIS — E1140 Type 2 diabetes mellitus with diabetic neuropathy, unspecified: Secondary | ICD-10-CM | POA: Diagnosis not present

## 2022-04-08 ENCOUNTER — Other Ambulatory Visit (HOSPITAL_COMMUNITY): Payer: Self-pay

## 2022-04-08 NOTE — Telephone Encounter (Signed)
ERROR

## 2022-04-11 NOTE — Telephone Encounter (Signed)
Mailed patient instructions per her request.

## 2022-04-15 ENCOUNTER — Ambulatory Visit: Payer: PPO | Admitting: Physician Assistant

## 2022-05-04 DIAGNOSIS — Z79899 Other long term (current) drug therapy: Secondary | ICD-10-CM | POA: Diagnosis not present

## 2022-05-04 DIAGNOSIS — M5136 Other intervertebral disc degeneration, lumbar region: Secondary | ICD-10-CM | POA: Diagnosis not present

## 2022-05-04 DIAGNOSIS — R892 Abnormal level of other drugs, medicaments and biological substances in specimens from other organs, systems and tissues: Secondary | ICD-10-CM | POA: Diagnosis not present

## 2022-05-04 DIAGNOSIS — M545 Low back pain, unspecified: Secondary | ICD-10-CM | POA: Diagnosis not present

## 2022-05-09 ENCOUNTER — Other Ambulatory Visit: Payer: Self-pay | Admitting: Internal Medicine

## 2022-05-09 DIAGNOSIS — E1140 Type 2 diabetes mellitus with diabetic neuropathy, unspecified: Secondary | ICD-10-CM | POA: Diagnosis not present

## 2022-05-11 ENCOUNTER — Other Ambulatory Visit (HOSPITAL_COMMUNITY): Payer: Self-pay

## 2022-05-11 ENCOUNTER — Telehealth: Payer: Self-pay | Admitting: Gastroenterology

## 2022-05-11 ENCOUNTER — Encounter (HOSPITAL_COMMUNITY): Payer: Self-pay | Admitting: Gastroenterology

## 2022-05-11 NOTE — Telephone Encounter (Signed)
Patient called requesting updated prep instructions to be faxed to [phone].

## 2022-05-11 NOTE — Telephone Encounter (Signed)
Spoke with patient and confirmed fax number. Faxed prep instructions to fax number provided.

## 2022-05-12 DIAGNOSIS — Z79899 Other long term (current) drug therapy: Secondary | ICD-10-CM | POA: Diagnosis not present

## 2022-05-18 NOTE — Anesthesia Preprocedure Evaluation (Addendum)
Anesthesia Evaluation  Patient identified by MRN, date of birth, ID band Patient awake    Reviewed: Allergy & Precautions, NPO status , Patient's Chart, lab work & pertinent test results  History of Anesthesia Complications Negative for: history of anesthetic complications  Airway Mallampati: III  TM Distance: >3 FB Neck ROM: Full    Dental   Denies loose teeth.:   Pulmonary neg shortness of breath, asthma , sleep apnea , COPD, neg recent URI, former smoker   Pulmonary exam normal breath sounds clear to auscultation       Cardiovascular hypertension (HCTZ), Pt. on medications (-) angina (-) Past MI, (-) Cardiac Stents and (-) CABG  Rhythm:Regular Rate:Normal  HLD   Neuro/Psych Chronic back pain  Neuromuscular disease (neuropathy, postlaminectomy syndrome, sciatica, lumbar radiculopathy)    GI/Hepatic ,GERD  Medicated,,(+)     substance abuse (last use 16 years ago)  cocaine useHepatic steatosis   Endo/Other  diabetes (on Ozempic, Hgb A1c 7.8), Poorly Controlled, Type 2  Morbid obesity  Renal/GU      Musculoskeletal  (+) Arthritis ,    Abdominal  (+) + obese  Peds  Hematology   Anesthesia Other Findings H/o right breast cancer  Last buprenorphine: patient denies  Last Ozempic: 05/12/2022  Reproductive/Obstetrics                             Anesthesia Physical Anesthesia Plan  ASA: 3  Anesthesia Plan: MAC   Post-op Pain Management:    Induction:   PONV Risk Score and Plan: 2 and Propofol infusion and Treatment may vary due to age or medical condition  Airway Management Planned: Natural Airway and Nasal Cannula  Additional Equipment:   Intra-op Plan:   Post-operative Plan:   Informed Consent: I have reviewed the patients History and Physical, chart, labs and discussed the procedure including the risks, benefits and alternatives for the proposed anesthesia with the patient  or authorized representative who has indicated his/her understanding and acceptance.     Dental advisory given  Plan Discussed with: CRNA and Anesthesiologist  Anesthesia Plan Comments: (Discussed with patient risks of MAC including, but not limited to, minor pain or discomfort, hearing people in the room, and possible need for backup general anesthesia. Risks for general anesthesia also discussed including, but not limited to, sore throat, hoarse voice, chipped/damaged teeth, injury to vocal cords, nausea and vomiting, allergic reactions, lung infection, heart attack, stroke, and death. All questions answered. )        Anesthesia Quick Evaluation

## 2022-05-19 ENCOUNTER — Encounter (HOSPITAL_COMMUNITY)
Admission: RE | Disposition: A | Discharge status: Home / Self Care | Payer: Self-pay | Source: Home / Self Care | Attending: Gastroenterology

## 2022-05-19 ENCOUNTER — Ambulatory Visit (HOSPITAL_COMMUNITY): Payer: PPO | Admitting: Anesthesiology

## 2022-05-19 ENCOUNTER — Encounter (HOSPITAL_COMMUNITY): Payer: Self-pay | Admitting: Gastroenterology

## 2022-05-19 ENCOUNTER — Ambulatory Visit (HOSPITAL_BASED_OUTPATIENT_CLINIC_OR_DEPARTMENT_OTHER): Payer: PPO | Admitting: Anesthesiology

## 2022-05-19 ENCOUNTER — Ambulatory Visit (HOSPITAL_COMMUNITY)
Admission: RE | Admit: 2022-05-19 | Discharge: 2022-05-19 | Disposition: A | Discharge status: Home / Self Care | Payer: PPO | Attending: Gastroenterology | Admitting: Gastroenterology

## 2022-05-19 ENCOUNTER — Other Ambulatory Visit: Payer: Self-pay

## 2022-05-19 DIAGNOSIS — K5730 Diverticulosis of large intestine without perforation or abscess without bleeding: Secondary | ICD-10-CM

## 2022-05-19 DIAGNOSIS — E119 Type 2 diabetes mellitus without complications: Secondary | ICD-10-CM | POA: Insufficient documentation

## 2022-05-19 DIAGNOSIS — G8929 Other chronic pain: Secondary | ICD-10-CM | POA: Diagnosis not present

## 2022-05-19 DIAGNOSIS — D122 Benign neoplasm of ascending colon: Secondary | ICD-10-CM

## 2022-05-19 DIAGNOSIS — J449 Chronic obstructive pulmonary disease, unspecified: Secondary | ICD-10-CM | POA: Insufficient documentation

## 2022-05-19 DIAGNOSIS — K640 First degree hemorrhoids: Secondary | ICD-10-CM

## 2022-05-19 DIAGNOSIS — Z7984 Long term (current) use of oral hypoglycemic drugs: Secondary | ICD-10-CM | POA: Insufficient documentation

## 2022-05-19 DIAGNOSIS — Z7951 Long term (current) use of inhaled steroids: Secondary | ICD-10-CM | POA: Insufficient documentation

## 2022-05-19 DIAGNOSIS — K219 Gastro-esophageal reflux disease without esophagitis: Secondary | ICD-10-CM | POA: Diagnosis not present

## 2022-05-19 DIAGNOSIS — Z853 Personal history of malignant neoplasm of breast: Secondary | ICD-10-CM | POA: Insufficient documentation

## 2022-05-19 DIAGNOSIS — I10 Essential (primary) hypertension: Secondary | ICD-10-CM | POA: Diagnosis not present

## 2022-05-19 DIAGNOSIS — D123 Benign neoplasm of transverse colon: Secondary | ICD-10-CM

## 2022-05-19 DIAGNOSIS — Z794 Long term (current) use of insulin: Secondary | ICD-10-CM | POA: Diagnosis not present

## 2022-05-19 DIAGNOSIS — E785 Hyperlipidemia, unspecified: Secondary | ICD-10-CM | POA: Diagnosis not present

## 2022-05-19 DIAGNOSIS — D126 Benign neoplasm of colon, unspecified: Secondary | ICD-10-CM | POA: Diagnosis not present

## 2022-05-19 DIAGNOSIS — Z09 Encounter for follow-up examination after completed treatment for conditions other than malignant neoplasm: Secondary | ICD-10-CM

## 2022-05-19 DIAGNOSIS — G4730 Sleep apnea, unspecified: Secondary | ICD-10-CM | POA: Diagnosis not present

## 2022-05-19 DIAGNOSIS — Z860100 Personal history of colon polyps, unspecified: Secondary | ICD-10-CM

## 2022-05-19 DIAGNOSIS — Z79899 Other long term (current) drug therapy: Secondary | ICD-10-CM | POA: Diagnosis not present

## 2022-05-19 DIAGNOSIS — Z6841 Body Mass Index (BMI) 40.0 and over, adult: Secondary | ICD-10-CM | POA: Insufficient documentation

## 2022-05-19 DIAGNOSIS — Z87891 Personal history of nicotine dependence: Secondary | ICD-10-CM | POA: Diagnosis not present

## 2022-05-19 DIAGNOSIS — Z1211 Encounter for screening for malignant neoplasm of colon: Secondary | ICD-10-CM | POA: Insufficient documentation

## 2022-05-19 HISTORY — PX: POLYPECTOMY: SHX5525

## 2022-05-19 HISTORY — PX: COLONOSCOPY WITH PROPOFOL: SHX5780

## 2022-05-19 LAB — GLUCOSE, CAPILLARY: Glucose-Capillary: 103 mg/dL — ABNORMAL HIGH (ref 70–99)

## 2022-05-19 SURGERY — COLONOSCOPY WITH PROPOFOL
Anesthesia: Monitor Anesthesia Care

## 2022-05-19 MED ORDER — SODIUM CHLORIDE 0.9 % IV SOLN: INTRAVENOUS | Status: DC

## 2022-05-19 MED ORDER — PROPOFOL 500 MG/50ML IV EMUL
INTRAVENOUS | Status: DC | PRN
  Administered 2022-05-19: 120 ug/kg/min via INTRAVENOUS

## 2022-05-19 MED ORDER — LIDOCAINE 2% (20 MG/ML) 5 ML SYRINGE
INTRAMUSCULAR | Status: DC | PRN
  Administered 2022-05-19: 60 mg via INTRAVENOUS

## 2022-05-19 MED ORDER — LACTATED RINGERS IV SOLN: INTRAVENOUS | Status: DC

## 2022-05-19 MED ORDER — PROPOFOL 500 MG/50ML IV EMUL
INTRAVENOUS | Status: AC
  Filled 2022-05-19: qty 50

## 2022-05-19 SURGICAL SUPPLY — 22 items

## 2022-05-19 NOTE — Transfer of Care (Signed)
Immediate Anesthesia Transfer of Care Note  Patient: [name]  Procedure(s) Performed: COLONOSCOPY WITH PROPOFOL POLYPECTOMY  Patient Location: PACU and Endoscopy Unit  Anesthesia Type:MAC  Level of Consciousness: awake, alert , oriented, and patient cooperative  Airway & Oxygen Therapy: Patient Spontanous Breathing and Patient connected to face mask oxygen  Post-op Assessment: Report given to RN, Post -op Vital signs reviewed and stable, and Patient moving all extremities  Post vital signs: Reviewed and stable  Last Vitals:  Vitals Value Taken Time  BP    Temp    Pulse    Resp    SpO2      Last Pain:  Vitals:   05/19/22 0749  TempSrc: Temporal  PainSc: 0-No pain         Complications: No notable events documented.

## 2022-05-19 NOTE — Discharge Instructions (Signed)
YOU HAD AN ENDOSCOPIC PROCEDURE TODAY: Refer to the procedure report and other information in the discharge instructions given to you for any specific questions about what was found during the examination. If this information does not answer your questions, please call [place] office at [phone] to clarify.  ° °YOU SHOULD EXPECT: Some feelings of bloating in the abdomen. Passage of more gas than usual. Walking can help get rid of the air that was put into your GI tract during the procedure and reduce the bloating. If you had a lower endoscopy (such as a colonoscopy or flexible sigmoidoscopy) you may notice spotting of blood in your stool or on the toilet paper. Some abdominal soreness may be present for a day or two, also. ° °DIET: Your first meal following the procedure should be a light meal and then it is ok to progress to your normal diet. A half-sandwich or bowl of soup is an example of a good first meal. Heavy or fried foods are harder to digest and may make you feel nauseous or bloated. Drink plenty of fluids but you should avoid alcoholic beverages for 24 hours. If you had a esophageal dilation, please see attached instructions for diet.   ° °ACTIVITY: Your care partner should take you home directly after the procedure. You should plan to take it easy, moving slowly for the rest of the day. You can resume normal activity the day after the procedure however YOU SHOULD NOT DRIVE, use power tools, machinery or perform tasks that involve climbing or major physical exertion for 24 hours (because of the sedation medicines used during the test).  ° °SYMPTOMS TO REPORT IMMEDIATELY: °A gastroenterologist can be reached at any hour. Please call [phone]  for any of the following symptoms:  °Following lower endoscopy (colonoscopy, flexible sigmoidoscopy) °Excessive amounts of blood in the stool  °Significant tenderness, worsening of abdominal pains  °Swelling of the abdomen that is new, acute  °Fever of 100° or  higher  °Following upper endoscopy (EGD, EUS, ERCP, esophageal dilation) °Vomiting of blood or coffee ground material  °New, significant abdominal pain  °New, significant chest pain or pain under the shoulder blades  °Painful or persistently difficult swallowing  °New shortness of breath  °Black, tarry-looking or red, bloody stools ° °FOLLOW UP:  °If any biopsies were taken you will be contacted by phone or by letter within the next 1-3 weeks. Call [phone]  if you have not heard about the biopsies in 3 weeks.  °Please also call with any specific questions about appointments or follow up tests. ° °

## 2022-05-19 NOTE — Progress Notes (Signed)
Patient denies pain, dizziness, nausea. She is alert and oriented. Advised the patient to take blood pressure medication today. She verbalized understanding.

## 2022-05-19 NOTE — Op Note (Signed)
[facility] Patient Name: [name] Procedure Date: 05/19/2022 MRN: [id number] Attending MD: Ladene Artist , MD, [id number] Date of Birth: 1960/07/30 CSN: [id number] Age: 61 Admit Type: Outpatient Procedure:                Colonoscopy Indications:              Surveillance: Personal history of adenomatous                            polyps on last colonoscopy 5 years ago Providers:                Pricilla Riffle. Fuller Plan, MD, Jeanella Cara, RN,                            William Dalton, Technician Referring MD:             Horald Pollen, MD Medicines:                Monitored Anesthesia Care Complications:            No immediate complications. Estimated blood loss:                            None. Estimated Blood Loss:     Estimated blood loss: none. Procedure:                Pre-Anesthesia Assessment:                           - Prior to the procedure, a History and Physical                            was performed, and patient medications and                            allergies were reviewed. The patient's tolerance of                            previous anesthesia was also reviewed. The risks                            and benefits of the procedure and the sedation                            options and risks were discussed with the patient.                            All questions were answered, and informed consent                            was obtained. Prior Anticoagulants: The patient has                            taken no anticoagulant or antiplatelet agents. ASA  Grade Assessment: III - A patient with severe                            systemic disease. After reviewing the risks and                            benefits, the patient was deemed in satisfactory                            condition to undergo the procedure.                           After obtaining informed consent, the colonoscope                             was passed under direct vision. Throughout the                            procedure, the patient's blood pressure, pulse, and                            oxygen saturations were monitored continuously. The                            [id number]-HQ190L ([id number]) Olympus colonoscope was                            introduced through the anus and advanced to the the                            cecum, identified by appendiceal orifice and                            ileocecal valve. The ileocecal valve, appendiceal                            orifice, and rectum were photographed. The quality                            of the bowel preparation was adequate. The                            colonoscopy was performed without difficulty. The                            patient tolerated the procedure well. Scope In: 8:44:56 AM Scope Out: 9:00:31 AM Scope Withdrawal Time: 0 hours 11 minutes 34 seconds  Total Procedure Duration: 0 hours 15 minutes 35 seconds  Findings:      The perianal and digital rectal examinations were normal.      Three sessile polyps were found in the transverse colon (2) and       ascending colon (1). The polyps were 7 to 8 mm in size. These polyps       were removed with a cold snare. Resection  and retrieval were complete.      A few small-mouthed diverticula were found in the left colon. There was       no evidence of diverticular bleeding.      Internal hemorrhoids were found during retroflexion. The hemorrhoids       were moderate and Grade I (internal hemorrhoids that do not prolapse).      The exam was otherwise without abnormality on direct and retroflexion       views. Impression:               - Three 7 to 8 mm polyps in the transverse colon                            and in the ascending colon, removed with a cold                            snare. Resected and retrieved.                           - Mild diverticulosis in the left colon.                           - Internal  hemorrhoids.                           - The examination was otherwise normal on direct                            and retroflexion views. Moderate Sedation:      Not Applicable - Patient had care per Anesthesia. Recommendation:           - Repeat colonoscopy after studies are complete for                            surveillance based on pathology results.                           - Patient has a contact number available for                            emergencies. The signs and symptoms of potential                            delayed complications were discussed with the                            patient. Return to normal activities tomorrow.                            Written discharge instructions were provided to the                            patient.                           - Resume previous diet.                           -  Continue present medications.                           - Await pathology results. Procedure Code(s):        --- Professional ---                           [phone], Colonoscopy, flexible; with removal of                            tumor(s), polyp(s), or other lesion(s) by snare                            technique Diagnosis Code(s):        --- Professional ---                           Z86.010, Personal history of colonic polyps                           D12.3, Benign neoplasm of transverse colon (hepatic                            flexure or splenic flexure)                           D12.2, Benign neoplasm of ascending colon                           K64.0, First degree hemorrhoids                           K57.30, Diverticulosis of large intestine without                            perforation or abscess without bleeding CPT copyright 2022 [employer]. All rights reserved. The codes documented in this report are preliminary and upon coder review may  be revised to meet current compliance requirements. Ladene Artist, MD 05/19/2022 9:06:17  AM This report has been signed electronically. Number of Addenda: 0

## 2022-05-19 NOTE — H&P (Signed)
History & Physical  Primary Care Physician:  Horald Pollen, MD Primary Gastroenterologist: Lucio Edward, MD  CHIEF COMPLAINT:  Personal history of colon polyps   HPI: JARETZI DROZ is a 61 y.o. female with a personal history of adenomatous colon polyps for surveillance colonoscopy.  History of difficult intubation.    Past Medical History:  Diagnosis Date   Allergy    Arthritis    back    Asthma    AS CHILD   Cancer ([place])    breast CA- Right,   Chronic back pain    Chronic leg pain    due to back pain   Cocaine abuse ([name])    in remission   COPD (chronic obstructive pulmonary disease) ([place])    Diabetes ([place])    with neuropathy   GERD (gastroesophageal reflux disease)    Hyperlipidemia    Hypertension    Internal hemorrhoids    Intraductal papilloma of left breast 02/18/2016   Neuromuscular disorder (HCC)    neuropathy   Personal history of radiation therapy    Postlaminectomy syndrome of lumbar region 12/07/2011   RECTAL BLEEDING 12/09/2008   Annotation: s/p EGD 7/08 mild gastritis, s/p colonoscopy 7/08- benign polyp  s/p polypectomy and isolated diverticulum.  Qualifier: Diagnosis of  By: Ditzler RN, Debra     Sciatica    per patient    Sleep apnea    CPAP    YEARS AGO DONE 1/2 YEARS AGO AND WAS TOLD DID NOT HAVE   Tobacco abuse    Tubular adenoma of colon    Uterine fibroid    s/p hysterectomy    Past Surgical History:  Procedure Laterality Date   BACK SURGERY  2012   L5-S1 microendoscopic disectomy last surgery 06/2011   BREAST BIOPSY     LEFT    01/19/16   BREAST BIOPSY Right 02/16/2021   BREAST EXCISIONAL BIOPSY Left 02/2016   BREAST LUMPECTOMY Right 03/12/2021   BREAST LUMPECTOMY WITH RADIOACTIVE SEED AND SENTINEL LYMPH NODE BIOPSY Right 03/12/2021   Procedure: RIGHT BREAST LUMPECTOMY WITH RADIOACTIVE SEED AND SENTINEL LYMPH NODE BIOPSY;  Surgeon: Jovita Kussmaul, MD;  Location: [facility];  Service: General;  Laterality: Right;   BREAST REDUCTION  SURGERY  1982   CATARACT EXTRACTION Right 11/07/2019   COLONOSCOPY     HAND SURGERY     MIDDLE TRIGGER FINGER RIGHT SIDE   RADIOACTIVE SEED GUIDED EXCISIONAL BREAST BIOPSY Left 02/18/2016   Procedure: LEFT RADIOACTIVE SEED GUIDED EXCISIONAL BREAST BIOPSY;  Surgeon: Alphonsa Overall, MD;  Location: [facility];  Service: General;  Laterality: Left;   REDUCTION MAMMAPLASTY Bilateral    TONSILLECTOMY  2008   TOTAL ABDOMINAL HYSTERECTOMY  05/24/2007   hysterectomy   TUBAL LIGATION     UPPER GASTROINTESTINAL ENDOSCOPY      Prior to Admission medications   Medication Sig Start Date End Date Taking? Authorizing Provider  acyclovir (ZOVIRAX) 400 MG tablet TAKE 1 TABLET BY MOUTH THREE TIMES DAILY AS NEEDED (FOR  FLARES) 10/27/20  Yes Jose Persia, MD  albuterol (VENTOLIN HFA) 108 (90 Base) MCG/ACT inhaler Inhale 2 puffs into the lungs every 6 (six) hours as needed for wheezing or shortness of breath. 05/28/20  Yes Young, Tarri Fuller D, MD  budesonide-formoterol [facility]) 160-4.5 MCG/ACT inhaler Inhale 2 puffs into the lungs 2 (two) times daily. Patient taking differently: Inhale 2 puffs into the lungs in the morning. 01/05/22  Yes Deneise Lever, MD  Buprenorphine HCl [address] [place]  1 strip inside cheek 2 (two) times daily as needed (pain). 11/25/21  Yes [provider]  Cholecalciferol (VITAMIN D3 PO) Take 1 tablet by mouth daily.   Yes [provider]  Cyanocobalamin (B-12 PO) Take 1 tablet by mouth daily.   Yes [provider]  diclofenac Sodium (VOLTAREN) 1 % GEL Apply 2 g topically 4 (four) times daily. Rub into affected area of foot 2 to 4 times daily Patient taking differently: Apply 2 g topically 4 (four) times daily as needed (pain). 01/14/22  Yes Trula Slade, DPM  dicyclomine (BENTYL) 10 MG capsule TAKE 1 CAPSULE BY MOUTH THREE TIMES DAILY BEFORE MEAL(S) 02/04/22  Yes Esterwood, Amy S, PA-C  esomeprazole (NEXIUM) 40 MG capsule Take 1 capsule (40 mg total) by mouth  2 (two) times daily before a meal. Take 30 minutes before breakfast and dinner. 02/04/22  Yes Esterwood, Amy S, PA-C  famotidine (PEPCID) 20 MG tablet Take 1 tablet (20 mg total) by mouth 2 (two) times daily as needed. 02/04/22  Yes Esterwood, Amy S, PA-C  FARXIGA 10 MG TABS tablet Take 1 tablet by mouth once daily 04/01/22  Yes Shamleffer, Melanie Crazier, MD  gabapentin (NEURONTIN) 800 MG tablet Take 800 mg by mouth 4 (four) times daily. 05/06/22  Yes [provider]  hydrochlorothiazide (HYDRODIURIL) 25 MG tablet Take 1 tablet (25 mg total) by mouth daily. 12/21/21  Yes Causey, [name] Massed, NP  letrozole [facility]) 2.5 MG tablet Take 1 tablet (2.5 mg total) by mouth daily. 06/13/21  Yes Nicholas Lose, MD  metFORMIN (GLUCOPHAGE) 1000 MG tablet Take 1 tablet (1,000 mg total) by mouth 2 (two) times daily with a meal. 01/19/22  Yes Shamleffer, Melanie Crazier, MD  Misc Natural Products (ELDERBERRY IMMUNE COMPLEX) CHEW Chew 1 tablet by mouth daily.   Yes [provider]  montelukast (SINGULAIR) 10 MG tablet Take 1 tablet (10 mg total) by mouth at bedtime. 12/03/21  Yes Martyn Ehrich, NP  Multiple Vitamin (MULTIVITAMIN WITH MINERALS) TABS tablet Take 1 tablet by mouth daily.   Yes [provider]  NARCAN 4 MG/0.1ML LIQD nasal spray kit Place 0.4 mg into the nose once. 12/06/18  Yes [provider]  NOVOLOG 100 UNIT/ML injection MAX DAILY 120 UNITS 05/10/22  Yes Shamleffer, Melanie Crazier, MD  oxyCODONE-acetaminophen (PERCOCET) 10-325 MG tablet Take 1 tablet by mouth every 8 (eight) hours as needed for pain. 03/02/20  Yes [provider]  Semaglutide, 2 MG/DOSE, (OZEMPIC, 2 MG/DOSE,) 8 MG/3ML SOPN Inject 2 mg into the skin once a week. 11/16/21  Yes Shamleffer, Melanie Crazier, MD  tiZANidine (ZANAFLEX) 4 MG capsule Take 4 mg by mouth 3 (three) times daily as needed for muscle spasms. 07/23/21  Yes [provider]  valACYclovir (VALTREX) 500 MG tablet TAKE  1 TABLET BY MOUTH TWICE DAILY FOR 5 DAYS AT  TIME  OF  OUTBREAK Patient taking differently: Take 500 mg by mouth 2 (two) times daily as needed (outbreak). 07/10/20  Yes Aslam, Loralyn Freshwater, MD  AMBULATORY NON FORMULARY MEDICATION Take 30 mLs by mouth as needed (Severe abdominal pain). Medication Name: GI Cocktail: 270 mLViscous Xylocain 2%, 270 mL Dicyclomione10 mg/5 mL, 810 mL Mylanta or Maylox, 02/04/22   Esterwood, Amy S, PA-C  Blood Glucose Monitoring Suppl (ONETOUCH VERIO FLEX SYSTEM) w/Device KIT 1 each by Does not apply route 3 (three) times daily. 01/12/17   Shela Leff, MD  Continuous Blood Gluc Sensor (FREESTYLE LIBRE 2 SENSOR) MISC Change sensor every 14 days  11/16/21   Shamleffer, Melanie Crazier, MD  Continuous Glucose Monitor DEVI Use as directed. 03/28/17   Shela Leff, MD  ezetimibe (ZETIA) 10 MG tablet Take 1 tablet (10 mg total) by mouth daily. Patient not taking: Reported on 05/17/2022 09/22/20 02/04/22  Harvie Heck, MD  gabapentin (NEURONTIN) 600 MG tablet Take 1 tablet (600 mg total) by mouth 2 (two) times daily. Patient not taking: Reported on 05/17/2022 11/09/21   Horald Pollen, MD  glucose blood (FREESTYLE TEST STRIPS) test strip 1 each by Other route 3 (three) times daily. 09/06/21   Shamleffer, Melanie Crazier, MD  Insulin Disposable Pump (OMNIPOD DASH INTRO, GEN 4,) KIT 1 Device by Does not apply route every 3 (three) days. 01/24/22   Shamleffer, Melanie Crazier, MD  Insulin Syringe-Needle U-100 (INSULIN SYRINGE 1CC/30GX1/2") 30G X 1/2" 1 ML MISC Use to fill Vgo daily 11/06/18   Dorrell, Andree Elk, MD  [facility] DELICA LANCETS FINE MISC Check blood sugar 3 times a day 06/22/16   Shela Leff, MD  rosuvastatin (CRESTOR) 10 MG tablet Take 1 tablet (10 mg total) by mouth daily. Patient not taking: Reported on 05/17/2022 11/27/20   Shamleffer, Melanie Crazier, MD    Current Facility-Administered Medications  Medication Dose Route Frequency Provider Last Rate Last Admin    0.9 %  sodium chloride infusion   Intravenous Continuous Ladene Artist, MD        Allergies as of 04/04/2022 - Review Complete 03/20/2022  Allergen Reaction Noted   Breo ellipta [fluticasone furoate-vilanterol] Other (See Comments) 02/10/2022   Lisinopril Cough     Family History  Problem Relation Age of Onset   Diabetes Father    Heart disease Father    Hypertension Father    Diabetes Mother    Cancer Mother        brain   Hypertension Mother    Kidney disease Sister    Diabetes Sister    Kidney cancer Sister    Stroke Sister    Diabetes Sister    Hypertension Sister    Diabetes Sister    Hypertension Sister    Obesity Son    Colon cancer Neg Hx    Colon polyps Neg Hx    Esophageal cancer Neg Hx    Rectal cancer Neg Hx    Stomach cancer Neg Hx     Social History   Socioeconomic History   Marital status: Married    Spouse name: Not on file   Number of children: 3   Years of education: Not on file   Highest education level: Not on file  Occupational History    Comment: [employer]  Tobacco Use   Smoking status: Former    Packs/day: 0.50    Years: 20.00    Total pack years: 10.00    Types: Cigarettes    Quit date: 07/23/1996    Years since quitting: 25.8   Smokeless tobacco: Never  Vaping Use   Vaping Use: Never used  Substance and Sexual Activity   Alcohol use: Not Currently    Comment: recovering addict clean for 9 years   Drug use: Not Currently    Types: Cocaine, Marijuana    Comment: recovering addict clean for 9 years   Sexual activity: Yes    Birth control/protection: Surgical  Other Topics Concern   Not on file  Social History Narrative   Current Social History 01/17/2020        Patient lives with spouse in a home which  is 1 story. There are not steps up to the entrance the patient uses.       Patient's method of transportation is personal car.      The highest level of education was some college.      The patient currently  works part-time.      Identified important Relationships are God, husband, kids, grandkids       Pets : None       Interests / Fun: Drawing and nature       Current Stressors: Pain, Covid       Religious / Personal Beliefs: Christian       Other: None    Social Determinants of Health   Financial Resource Strain: Low Risk  (02/18/2022)   Overall Financial Resource Strain (CARDIA)    Difficulty of Paying Living Expenses: Not hard at all  Food Insecurity: No Food Insecurity (02/18/2022)   Hunger Vital Sign    Worried About Running Out of Food in the Last Year: Never true    [place] in the Last Year: Never true  Transportation Needs: No Transportation Needs (02/18/2022)   PRAPARE - Hydrologist (Medical): No    Lack of Transportation (Non-Medical): No  Physical Activity: Insufficiently Active (02/18/2022)   Exercise Vital Sign    Days of Exercise per Week: 2 days    Minutes of Exercise per Session: 30 min  Stress: No Stress Concern Present (02/18/2022)   [place]    Feeling of Stress : Not at all  Social Connections: Moderately Integrated (02/18/2022)   Social Connection and Isolation Panel [NHANES]    Frequency of Communication with Friends and Family: More than three times a week    Frequency of Social Gatherings with Friends and Family: More than three times a week    Attends Religious Services: More than 4 times per year    Active Member of [employer] or Organizations: No    Attends Archivist Meetings: Never    Marital Status: Married  Human resources officer Violence: Not At Risk (02/18/2022)   Humiliation, Afraid, Rape, and Kick questionnaire    Fear of Current or Ex-Partner: No    Emotionally Abused: No    Physically Abused: No    Sexually Abused: No    Review of Systems:  All systems reviewed were negative except where noted in HPI.   Physical Exam: General:  Alert,  well-developed, in NAD Head:  Normocephalic and atraumatic. Eyes:  Sclera clear, no icterus.   Conjunctiva pink. Ears:  Normal auditory acuity. Mouth:  No deformity or lesions.  Neck:  Supple; no masses . Lungs:  Clear throughout to auscultation.   No wheezes, crackles, or rhonchi. No acute distress. Heart:  Regular rate and rhythm; no murmurs. Abdomen:  Soft, nondistended, nontender. No masses, hepatomegaly. No obvious masses.  Normal bowel .    Rectal:  Deferred   Msk:  Symmetrical without gross deformities.. Pulses:  Normal pulses noted. Extremities:  Without edema. Neurologic:  Alert and  oriented x4;  grossly normal neurologically. Skin:  Intact without significant lesions or rashes. Psych:  Alert and cooperative. Normal mood and affect.  Impression / Plan:   Personal history of adenomatous colon polyps for surveillance colonoscopy.  History of difficult intubation.  Pricilla Riffle. Fuller Plan  05/19/2022, 7:41 AM See Shea Evans, [facility], to contact our on call provider

## 2022-05-19 NOTE — Anesthesia Postprocedure Evaluation (Signed)
Anesthesia Post Note  Patient: [name]  Procedure(s) Performed: COLONOSCOPY WITH PROPOFOL POLYPECTOMY     Patient location during evaluation: PACU Anesthesia Type: MAC Level of consciousness: awake Pain management: pain level controlled Vital Signs Assessment: post-procedure vital signs reviewed and stable Respiratory status: spontaneous breathing, nonlabored ventilation and respiratory function stable Cardiovascular status: stable and blood pressure returned to baseline Postop Assessment: no apparent nausea or vomiting Anesthetic complications: no   No notable events documented.  Last Vitals:  Vitals:   05/19/22 0930 05/19/22 0940  BP: (!) 201/59 (!) 197/69  Pulse: 63 63  Resp: 18 17  Temp:    SpO2: 93% 95%    Last Pain:  Vitals:   05/19/22 0940  TempSrc:   PainSc: 0-No pain                 Nilda Simmer

## 2022-05-20 LAB — SURGICAL PATHOLOGY

## 2022-05-23 ENCOUNTER — Encounter (HOSPITAL_COMMUNITY): Payer: Self-pay | Admitting: Gastroenterology

## 2022-05-25 ENCOUNTER — Other Ambulatory Visit: Payer: Self-pay

## 2022-05-25 ENCOUNTER — Encounter: Payer: Self-pay | Admitting: Gastroenterology

## 2022-05-25 MED ORDER — NOVOLOG 100 UNIT/ML IJ SOLN: INTRAMUSCULAR | 3 refills | Status: DC

## 2022-05-29 ENCOUNTER — Other Ambulatory Visit: Payer: Self-pay | Admitting: Internal Medicine

## 2022-05-29 DIAGNOSIS — A6004 Herpesviral vulvovaginitis: Secondary | ICD-10-CM

## 2022-05-30 MED ORDER — VALACYCLOVIR HCL 500 MG PO TABS: ORAL_TABLET | ORAL | 0 refills | Status: DC

## 2022-05-30 MED ORDER — ACYCLOVIR 400 MG PO TABS: 400.0000 mg | ORAL_TABLET | Freq: Four times a day (QID) | ORAL | 0 refills | Status: DC

## 2022-06-01 ENCOUNTER — Ambulatory Visit (INDEPENDENT_AMBULATORY_CARE_PROVIDER_SITE_OTHER): Payer: PPO | Admitting: Emergency Medicine

## 2022-06-01 ENCOUNTER — Encounter: Payer: Self-pay | Admitting: Emergency Medicine

## 2022-06-01 VITALS — BP 128/74 | HR 72 | Temp 98.3°F | Ht 64.0 in | Wt 242.0 lb

## 2022-06-01 DIAGNOSIS — E559 Vitamin D deficiency, unspecified: Secondary | ICD-10-CM | POA: Diagnosis not present

## 2022-06-01 DIAGNOSIS — I10 Essential (primary) hypertension: Secondary | ICD-10-CM | POA: Diagnosis not present

## 2022-06-01 DIAGNOSIS — Z79899 Other long term (current) drug therapy: Secondary | ICD-10-CM | POA: Diagnosis not present

## 2022-06-01 DIAGNOSIS — E1159 Type 2 diabetes mellitus with other circulatory complications: Secondary | ICD-10-CM

## 2022-06-01 DIAGNOSIS — M5136 Other intervertebral disc degeneration, lumbar region: Secondary | ICD-10-CM | POA: Diagnosis not present

## 2022-06-01 DIAGNOSIS — M129 Arthropathy, unspecified: Secondary | ICD-10-CM | POA: Diagnosis not present

## 2022-06-01 DIAGNOSIS — G894 Chronic pain syndrome: Secondary | ICD-10-CM | POA: Diagnosis not present

## 2022-06-01 DIAGNOSIS — E1169 Type 2 diabetes mellitus with other specified complication: Secondary | ICD-10-CM

## 2022-06-01 DIAGNOSIS — E785 Hyperlipidemia, unspecified: Secondary | ICD-10-CM

## 2022-06-01 DIAGNOSIS — M545 Low back pain, unspecified: Secondary | ICD-10-CM | POA: Diagnosis not present

## 2022-06-01 DIAGNOSIS — I152 Hypertension secondary to endocrine disorders: Secondary | ICD-10-CM | POA: Diagnosis not present

## 2022-06-01 DIAGNOSIS — J449 Chronic obstructive pulmonary disease, unspecified: Secondary | ICD-10-CM | POA: Diagnosis not present

## 2022-06-01 DIAGNOSIS — E119 Type 2 diabetes mellitus without complications: Secondary | ICD-10-CM | POA: Diagnosis not present

## 2022-06-01 DIAGNOSIS — M542 Cervicalgia: Secondary | ICD-10-CM | POA: Diagnosis not present

## 2022-06-01 LAB — COMPREHENSIVE METABOLIC PANEL
ALT: 24 U/L (ref 0–35)
AST: 26 U/L (ref 0–37)
Albumin: 4.3 g/dL (ref 3.5–5.2)
Alkaline Phosphatase: 65 U/L (ref 39–117)
BUN: 9 mg/dL (ref 6–23)
CO2: 35 mEq/L — ABNORMAL HIGH (ref 19–32)
Calcium: 10.3 mg/dL (ref 8.4–10.5)
Chloride: 97 mEq/L (ref 96–112)
Creatinine, Ser: 0.76 mg/dL (ref 0.40–1.20)
GFR: 84.34 mL/min (ref >60.00)
Glucose, Bld: 111 mg/dL — ABNORMAL HIGH (ref 70–99)
Potassium: 3.3 mEq/L — ABNORMAL LOW (ref 3.5–5.1)
Sodium: 141 mEq/L (ref 135–145)
Total Bilirubin: 0.3 mg/dL (ref 0.2–1.2)
Total Protein: 7.8 g/dL (ref 6.0–8.3)

## 2022-06-01 LAB — MICROALBUMIN / CREATININE URINE RATIO
Creatinine,U: 34.8 mg/dL
Microalb Creat Ratio: 2 mg/g (ref 0.0–30.0)
Microalb, Ur: <0.7 mg/dL (ref 0.0–1.9)

## 2022-06-01 LAB — POCT GLYCOSYLATED HEMOGLOBIN (HGB A1C): Hemoglobin A1C: 6.6 % — AB (ref 4.0–5.6)

## 2022-06-01 LAB — LIPID PANEL
Cholesterol: 284 mg/dL — ABNORMAL HIGH (ref 0–200)
HDL: 44.9 mg/dL (ref >39.00)
LDL Cholesterol: 207 mg/dL — ABNORMAL HIGH (ref 0–99)
NonHDL: 238.95
Total CHOL/HDL Ratio: 6
Triglycerides: 162 mg/dL — ABNORMAL HIGH (ref 0.0–149.0)
VLDL: 32.4 mg/dL (ref 0.0–40.0)

## 2022-06-01 NOTE — Assessment & Plan Note (Signed)
Stable.  Diet and nutrition discussed.  Continue Zetia 10 mg daily.  Intolerant to statins. The 10-year ASCVD risk score (Arnett DK, et al., 2019) is: 14.3%   Values used to calculate the score:     Age: 61 years     Sex: Female     Is Non-Hispanic African American: Yes     Diabetic: Yes     Tobacco smoker: No     Systolic Blood Pressure: 659 mmHg     Is BP treated: Yes     HDL Cholesterol: 34.5 mg/dL     Total Cholesterol: 131 mg/dL

## 2022-06-01 NOTE — Assessment & Plan Note (Signed)
Diet and nutrition discussed.  Advised to decrease amount of daily carbohydrate intake and daily calories and increase amount of plant-based protein in her diet. 

## 2022-06-01 NOTE — Assessment & Plan Note (Addendum)
Stable.  Continue Symbicort 2 puffs twice a day Continue albuterol as rescue inhaler

## 2022-06-01 NOTE — Progress Notes (Signed)
[name] 61 y.o.   Chief Complaint  Patient presents with   Follow-up    F/u medication refill, no concerns     HISTORY OF PRESENT ILLNESS: This is a 61 y.o. female A1A here for follow-up of diabetes and hypertension Overall doing well.  Has no complaints or medical concerns today. Lab Results  Component Value Date   HGBA1C 7.8 (A) 03/08/2022   Wt Readings from Last 3 Encounters:  06/01/22 242 lb (109.8 kg)  05/19/22 244 lb (110.7 kg)  03/15/22 239 lb (108.4 kg)     HPI   Prior to Admission medications   Medication Sig Start Date End Date Taking? Authorizing Provider  acyclovir (ZOVIRAX) 400 MG tablet Take 1 tablet (400 mg total) by mouth 4 (four) times daily. 05/30/22  Yes Sahith Nurse, Ines Bloomer, MD  albuterol (VENTOLIN HFA) 108 (90 Base) MCG/ACT inhaler Inhale 2 puffs into the lungs every 6 (six) hours as needed for wheezing or shortness of breath. 05/28/20  Yes Young, Tarri Fuller D, MD  AMBULATORY NON FORMULARY MEDICATION Take 30 mLs by mouth as needed (Severe abdominal pain). Medication Name: GI Cocktail: 270 mLViscous Xylocain 2%, 270 mL Dicyclomione10 mg/5 mL, 810 mL Mylanta or Maylox, 02/04/22  Yes Esterwood, Amy S, PA-C  Blood Glucose Monitoring Suppl (ONETOUCH VERIO FLEX SYSTEM) w/Device KIT 1 each by Does not apply route 3 (three) times daily. 01/12/17  Yes Shela Leff, MD  budesonide-formoterol [facility]) 160-4.5 MCG/ACT inhaler Inhale 2 puffs into the lungs 2 (two) times daily. Patient taking differently: Inhale 2 puffs into the lungs in the morning. 01/05/22  Yes Young, Tarri Fuller D, MD  Buprenorphine HCl 750 MCG FILM Place 1 strip inside cheek 2 (two) times daily as needed (pain). 11/25/21  Yes [provider]  Cholecalciferol (VITAMIN D3 PO) Take 1 tablet by mouth daily.   Yes [provider]  Continuous Blood Gluc Sensor (FREESTYLE LIBRE 2 SENSOR) MISC Change sensor every 14 days 11/16/21  Yes Shamleffer, Melanie Crazier, MD  Continuous Glucose  Monitor DEVI Use as directed. 03/28/17  Yes Shela Leff, MD  Cyanocobalamin (B-12 PO) Take 1 tablet by mouth daily.   Yes [provider]  diclofenac Sodium (VOLTAREN) 1 % GEL Apply 2 g topically 4 (four) times daily. Rub into affected area of foot 2 to 4 times daily Patient taking differently: Apply 2 g topically 4 (four) times daily as needed (pain). 01/14/22  Yes Trula Slade, DPM  dicyclomine (BENTYL) 10 MG capsule TAKE 1 CAPSULE BY MOUTH THREE TIMES DAILY BEFORE MEAL(S) 02/04/22  Yes Esterwood, Amy S, PA-C  esomeprazole (NEXIUM) 40 MG capsule Take 1 capsule (40 mg total) by mouth 2 (two) times daily before a meal. Take 30 minutes before breakfast and dinner. 02/04/22  Yes Esterwood, Amy S, PA-C  famotidine (PEPCID) 20 MG tablet Take 1 tablet (20 mg total) by mouth 2 (two) times daily as needed. 02/04/22  Yes Esterwood, Amy S, PA-C  FARXIGA 10 MG TABS tablet Take 1 tablet by mouth once daily 04/01/22  Yes Shamleffer, Melanie Crazier, MD  gabapentin (NEURONTIN) 800 MG tablet Take 800 mg by mouth 4 (four) times daily. 05/06/22  Yes [provider]  glucose blood (FREESTYLE TEST STRIPS) test strip 1 each by Other route 3 (three) times daily. 09/06/21  Yes Shamleffer, Melanie Crazier, MD  hydrochlorothiazide (HYDRODIURIL) 25 MG tablet Take 1 tablet (25 mg total) by mouth daily. 12/21/21  Yes Causey, [name] Massed, NP  Insulin Disposable Pump (OMNIPOD DASH INTRO, GEN  4,) KIT 1 Device by Does not apply route every 3 (three) days. 01/24/22  Yes Shamleffer, Melanie Crazier, MD  Insulin Syringe-Needle U-100 (INSULIN SYRINGE 1CC/30GX1/2") 30G X 1/2" 1 ML MISC Use to fill Vgo daily 11/06/18  Yes Dorrell, Andree Elk, MD  letrozole [facility]) 2.5 MG tablet Take 1 tablet (2.5 mg total) by mouth daily. 06/13/21  Yes Nicholas Lose, MD  metFORMIN (GLUCOPHAGE) 1000 MG tablet Take 1 tablet (1,000 mg total) by mouth 2 (two) times daily with a meal. 01/19/22  Yes Shamleffer, Melanie Crazier, MD  Misc  Natural Products (ELDERBERRY IMMUNE COMPLEX) CHEW Chew 1 tablet by mouth daily.   Yes [provider]  montelukast (SINGULAIR) 10 MG tablet Take 1 tablet (10 mg total) by mouth at bedtime. 12/03/21  Yes Martyn Ehrich, NP  Multiple Vitamin (MULTIVITAMIN WITH MINERALS) TABS tablet Take 1 tablet by mouth daily.   Yes [provider]  NARCAN 4 MG/0.1ML LIQD nasal spray kit Place 0.4 mg into the nose once. 12/06/18  Yes [provider]  NOVOLOG 100 UNIT/ML injection MAX DAILY 120 UNITS  DX E11.65 05/25/22  Yes Shamleffer, Melanie Crazier, MD  [facility] DELICA LANCETS FINE MISC Check blood sugar 3 times a day 06/22/16  Yes Shela Leff, MD  oxyCODONE-acetaminophen (PERCOCET) 10-325 MG tablet Take 1 tablet by mouth every 8 (eight) hours as needed for pain. 03/02/20  Yes [provider]  rosuvastatin (CRESTOR) 10 MG tablet Take 1 tablet (10 mg total) by mouth daily. 11/27/20  Yes Shamleffer, Melanie Crazier, MD  Semaglutide, 2 MG/DOSE, (OZEMPIC, 2 MG/DOSE,) 8 MG/3ML SOPN Inject 2 mg into the skin once a week. 11/16/21  Yes Shamleffer, Melanie Crazier, MD  tiZANidine (ZANAFLEX) 4 MG capsule Take 4 mg by mouth 3 (three) times daily as needed for muscle spasms. 07/23/21  Yes [provider]  valACYclovir (VALTREX) 500 MG tablet TAKE 1 TABLET BY MOUTH TWICE DAILY FOR 5 DAYS AT  TIME  OF  OUTBREAK 05/30/22  Yes Bethania Schlotzhauer, Ines Bloomer, MD  ezetimibe (ZETIA) 10 MG tablet Take 1 tablet (10 mg total) by mouth daily. Patient not taking: Reported on 05/17/2022 09/22/20 02/04/22  Harvie Heck, MD    Allergies  Allergen Reactions   Adair Patter [Fluticasone Furoate-Vilanterol] Other (See Comments)    Urinary retention   Lisinopril Cough    Patient Active Problem List   Diagnosis Date Noted   Personal history of colonic polyps 05/19/2022   Benign neoplasm of ascending colon 05/19/2022   Benign neoplasm of transverse colon 05/19/2022   Malignant neoplasm of upper-outer  quadrant of right breast in female, estrogen receptor positive ([place]) 02/22/2021   Pre-operative respiratory examination 09/04/2020   Body mass index (BMI) 40.0-44.9, adult ([place]) 07/28/2020   Lumbago with sciatica, right side 07/28/2020   Elevated CK 07/11/2020   Dyspnea on exertion 07/11/2020   Fracture of base of fifth metatarsal bone of right foot at metaphyseal-diaphyseal junction with nonunion 05/07/2019   OSA on CPAP 10/31/2018   Stress incontinence in female 03/09/2017   Hepatic steatosis 08/29/2016   Herpes simplex 03/08/2016   Diabetic neuropathy with neurologic complication ([place]) 09/32/6712   Long term current use of opiate analgesic 07/21/2015   Preventative health care 05/21/2015   COPD GOLD II  05/31/2013   Lung nodule < 6cm on CT 05/31/2013   Chronic cough 12/07/2012   Chronic pain syndrome 02/13/2012   Lumbar radiculopathy 11/29/2011   Type 2 diabetes mellitus with diabetic neuropathy ([place]) 08/25/2011   Back pain  11/15/2010   Hypertension associated with diabetes ([place]) 07/07/2009   GERD 01/08/2009   Dyslipidemia associated with type 2 diabetes mellitus ([place]) 12/09/2008   Hyperlipidemia 12/09/2008   Morbid obesity due to excess calories ([place]) 12/09/2008    Past Medical History:  Diagnosis Date   Allergy    Arthritis    back    Asthma    AS CHILD   Cancer ([place])    breast CA- Right,   Chronic back pain    Chronic leg pain    due to back pain   Cocaine abuse ([place])    in remission   COPD (chronic obstructive pulmonary disease) ([place])    Diabetes ([place])    with neuropathy   GERD (gastroesophageal reflux disease)    Hyperlipidemia    Hypertension    Internal hemorrhoids    Intraductal papilloma of left breast 02/18/2016   Neuromuscular disorder (HCC)    neuropathy   Personal history of radiation therapy    Postlaminectomy syndrome of lumbar region 12/07/2011   RECTAL BLEEDING 12/09/2008   Annotation: s/p EGD 7/08 mild gastritis, s/p colonoscopy 7/08- benign  polyp  s/p polypectomy and isolated diverticulum.  Qualifier: Diagnosis of  By: Ditzler RN, Debra     Sciatica    per patient    Sleep apnea    CPAP    YEARS AGO DONE 1/2 YEARS AGO AND WAS TOLD DID NOT HAVE   Tobacco abuse    Tubular adenoma of colon    Uterine fibroid    s/p hysterectomy    Past Surgical History:  Procedure Laterality Date   BACK SURGERY  2012   L5-S1 microendoscopic disectomy last surgery 06/2011   BREAST BIOPSY     LEFT    01/19/16   BREAST BIOPSY Right 02/16/2021   BREAST EXCISIONAL BIOPSY Left 02/2016   BREAST LUMPECTOMY Right 03/12/2021   BREAST LUMPECTOMY WITH RADIOACTIVE SEED AND SENTINEL LYMPH NODE BIOPSY Right 03/12/2021   Procedure: RIGHT BREAST LUMPECTOMY WITH RADIOACTIVE SEED AND SENTINEL LYMPH NODE BIOPSY;  Surgeon: Jovita Kussmaul, MD;  Location: [facility];  Service: General;  Laterality: Right;   BREAST REDUCTION SURGERY  1982   CATARACT EXTRACTION Right 11/07/2019   COLONOSCOPY     COLONOSCOPY WITH PROPOFOL N/A 05/19/2022   Procedure: COLONOSCOPY WITH PROPOFOL;  Surgeon: Ladene Artist, MD;  Location: [facility];  Service: Gastroenterology;  Laterality: N/A;   HAND SURGERY     MIDDLE TRIGGER FINGER RIGHT SIDE   POLYPECTOMY  05/19/2022   Procedure: POLYPECTOMY;  Surgeon: Ladene Artist, MD;  Location: [facility];  Service: Gastroenterology;;   RADIOACTIVE SEED GUIDED EXCISIONAL BREAST BIOPSY Left 02/18/2016   Procedure: LEFT RADIOACTIVE SEED GUIDED EXCISIONAL BREAST BIOPSY;  Surgeon: Alphonsa Overall, MD;  Location: [facility];  Service: General;  Laterality: Left;   REDUCTION MAMMAPLASTY Bilateral    TONSILLECTOMY  2008   TOTAL ABDOMINAL HYSTERECTOMY  05/24/2007   hysterectomy   TUBAL LIGATION     UPPER GASTROINTESTINAL ENDOSCOPY      Social History   Socioeconomic History   Marital status: Married    Spouse name: Not on file   Number of children: 3   Years of education: Not on file   Highest education level: Not on file  Occupational History     Comment: [employer]  Tobacco Use   Smoking status: Former    Packs/day: 0.50    Years: 20.00    Total pack years: 10.00    Types:  Cigarettes    Quit date: 07/23/1996    Years since quitting: 25.8   Smokeless tobacco: Never  Vaping Use   Vaping Use: Never used  Substance and Sexual Activity   Alcohol use: Not Currently    Comment: recovering addict clean for 9 years   Drug use: Not Currently    Types: Cocaine, Marijuana    Comment: recovering addict clean for 9 years   Sexual activity: Yes    Birth control/protection: Surgical  Other Topics Concern   Not on file  Social History Narrative   Current Social History 01/17/2020        Patient lives with spouse in a home which is 1 story. There are not steps up to the entrance the patient uses.       Patient's method of transportation is personal car.      The highest level of education was some college.      The patient currently works part-time.      Identified important Relationships are God, husband, kids, grandkids       Pets : None       Interests / Fun: Drawing and nature       Current Stressors: Pain, Covid       Religious / Personal Beliefs: Christian       Other: None    Social Determinants of Health   Financial Resource Strain: Low Risk  (02/18/2022)   Overall Financial Resource Strain (CARDIA)    Difficulty of Paying Living Expenses: Not hard at all  Food Insecurity: No Food Insecurity (02/18/2022)   Hunger Vital Sign    Worried About Running Out of Food in the Last Year: Never true    [place] in the Last Year: Never true  Transportation Needs: No Transportation Needs (02/18/2022)   PRAPARE - Hydrologist (Medical): No    Lack of Transportation (Non-Medical): No  Physical Activity: Insufficiently Active (02/18/2022)   Exercise Vital Sign    Days of Exercise per Week: 2 days    Minutes of Exercise per Session: 30 min  Stress: No Stress Concern Present (02/18/2022)    [employer]acle    Feeling of Stress : Not at all  Social Connections: Moderately Integrated (02/18/2022)   Social Connection and Isolation Panel [NHANES]    Frequency of Communication with Friends and Family: More than three times a week    Frequency of Social Gatherings with Friends and Family: More than three times a week    Attends Religious Services: More than 4 times per year    Active Member of [employer] or Organizations: No    Attends Archivist Meetings: Never    Marital Status: Married  Human resources officer Violence: Not At Risk (02/18/2022)   Humiliation, Afraid, Rape, and Kick questionnaire    Fear of Current or Ex-Partner: No    Emotionally Abused: No    Physically Abused: No    Sexually Abused: No    Family History  Problem Relation Age of Onset   Diabetes Father    Heart disease Father    Hypertension Father    Diabetes Mother    Cancer Mother        brain   Hypertension Mother    Kidney disease Sister    Diabetes Sister    Kidney cancer Sister    Stroke Sister    Diabetes Sister    Hypertension Sister  Diabetes Sister    Hypertension Sister    Obesity Son    Colon cancer Neg Hx    Colon polyps Neg Hx    Esophageal cancer Neg Hx    Rectal cancer Neg Hx    Stomach cancer Neg Hx      Review of Systems  Constitutional: Negative.  Negative for chills and fever.  HENT: Negative.  Negative for congestion and sore throat.   Respiratory: Negative.  Negative for cough and shortness of breath.   Cardiovascular: Negative.  Negative for chest pain and palpitations.  Gastrointestinal: Negative.  Negative for abdominal pain, diarrhea, nausea and vomiting.  Genitourinary: Negative.  Negative for dysuria and hematuria.  Skin: Negative.  Negative for rash.  Neurological: Negative.  Negative for dizziness and headaches.  All other systems reviewed and are negative.   Today's Vitals   06/01/22 0831  BP:  128/74  Pulse: 72  Temp: 98.3 F (36.8 C)  TempSrc: Oral  SpO2: 92%  Weight: 242 lb (109.8 kg)  Height: _0  (1.626 m)   Body mass index is 41.54 kg/m.  Physical Exam Vitals reviewed.  Constitutional:      Appearance: Normal appearance. She is obese.  HENT:     Head: Normocephalic.     Mouth/Throat:     Mouth: Mucous membranes are moist.     Pharynx: Oropharynx is clear.  Eyes:     Extraocular Movements: Extraocular movements intact.     Pupils: Pupils are equal, round, and reactive to light.  Cardiovascular:     Rate and Rhythm: Normal rate and regular rhythm.     Pulses: Normal pulses.     Heart sounds: Normal heart sounds.  Pulmonary:     Effort: Pulmonary effort is normal.     Breath sounds: Normal breath sounds.  Musculoskeletal:     Cervical back: No tenderness.  Lymphadenopathy:     Cervical: No cervical adenopathy.  Skin:    General: Skin is warm and dry.  Neurological:     General: No focal deficit present.     Mental Status: She is alert and oriented to person, place, and time.  Psychiatric:        Mood and Affect: Mood normal.        Behavior: Behavior normal.    Results for orders placed or performed in visit on 06/01/22 (from the past 24 hour(s))  POCT HgB A1C     Status: Abnormal   Collection Time: 06/01/22  9:08 AM  Result Value Ref Range   Hemoglobin A1C 6.6 (A) 4.0 - 5.6 %   HbA1c POC (<> result, manual entry)     HbA1c, POC (prediabetic range)     HbA1c, POC (controlled diabetic range)     *Note: Due to a large number of results and/or encounters for the requested time period, some results have not been displayed. A complete set of results can be found in Results Review.      ASSESSMENT & PLAN: A total of 44 minutes was spent with the patient and counseling/coordination of care regarding preparing for this visit, review of most recent office visit notes, review of most recent blood work results including interpretation of today's  hemoglobin A1c, review of multiple chronic medical conditions and their management, review of all medications, education on nutrition, cardiovascular risks associated with hypertension and diabetes, prognosis, documentation, and need for follow-up.   Problem List Items Addressed This Visit       Cardiovascular and Mediastinum  Hypertension associated with diabetes ([place]) - Primary    Well-controlled hypertension. Continue hydrochlorothiazide 25 mg Well-controlled diabetes with hemoglobin A1c of 6.6. Sees endocrinologist on a regular basis. Continue Farxiga 10 mg daily, metformin 1000 mg twice a day, Ozempic 2 mg weekly and NovoLog insulin Diet and nutrition discussed. Cardiovascular risks associated with diabetes and hypertension discussed Follow up in 6 months.      Relevant Orders   POCT HgB A1C   Comprehensive metabolic panel   Lipid panel   Urine Microalbumin w/creat. ratio     Respiratory   COPD GOLD II     Stable.  Continue Symbicort 2 puffs twice a day Continue albuterol as rescue inhaler        Endocrine   Dyslipidemia associated with type 2 diabetes mellitus (HCC)    Stable.  Diet and nutrition discussed.  Continue Zetia 10 mg daily.  Intolerant to statins. The 10-year ASCVD risk score (Arnett DK, et al., 2019) is: 14.3%   Values used to calculate the score:     Age: 65 years     Sex: Female     Is Non-Hispanic African American: Yes     Diabetic: Yes     Tobacco smoker: No     Systolic Blood Pressure: 161 mmHg     Is BP treated: Yes     HDL Cholesterol: 34.5 mg/dL     Total Cholesterol: 131 mg/dL         Other   Morbid obesity due to excess calories (HCC)    Diet and nutrition discussed.  Advised to decrease amount of daily carbohydrate intake and daily calories and increase amount of plant based protein in her diet.      Chronic pain syndrome    Stable.  Follows up with pain management clinic on a regular basis On long-term opiate use      Patient  Instructions  Diabetes Mellitus and Nutrition, Adult When you have diabetes, or diabetes mellitus, it is very important to have healthy eating habits because your blood sugar (glucose) levels are greatly affected by what you eat and drink. Eating healthy foods in the right amounts, at about the same times every day, can help you: Manage your blood glucose. Lower your risk of heart disease. Improve your blood pressure. Reach or maintain a healthy weight. What can affect my meal plan? Every person with diabetes is different, and each person has different needs for a meal plan. Your health care provider may recommend that you work with a dietitian to make a meal plan that is best for you. Your meal plan may vary depending on factors such as: The calories you need. The medicines you take. Your weight. Your blood glucose, blood pressure, and cholesterol levels. Your activity level. Other health conditions you have, such as heart or kidney disease. How do carbohydrates affect me? Carbohydrates, also called carbs, affect your blood glucose level more than any other type of food. Eating carbs raises the amount of glucose in your blood. It is important to know how many carbs you can safely have in each meal. This is different for every person. Your dietitian can help you calculate how many carbs you should have at each meal and for each snack. How does alcohol affect me? Alcohol can cause a decrease in blood glucose (hypoglycemia), especially if you use insulin or take certain diabetes medicines by mouth. Hypoglycemia can be a life-threatening condition. Symptoms of hypoglycemia, such as sleepiness, dizziness, and confusion, are similar  to symptoms of having too much alcohol. Do not drink alcohol if: Your health care provider tells you not to drink. You are pregnant, may be pregnant, or are planning to become pregnant. If you drink alcohol: Limit how much you have to: 0-1 drink a day for women. 0-2  drinks a day for men. Know how much alcohol is in your drink. In the [place] one drink equals one 12 oz bottle of beer (355 mL), one 5 oz glass of wine (148 mL), or one 1 oz glass of hard liquor (44 mL). Keep yourself hydrated with water, diet soda, or unsweetened iced tea. Keep in mind that regular soda, juice, and other mixers may contain a lot of sugar and must be counted as carbs. What are tips for following this plan?  Reading food labels Start by checking the serving size on the Nutrition Facts label of packaged foods and drinks. The number of calories and the amount of carbs, fats, and other nutrients listed on the label are based on one serving of the item. Many items contain more than one serving per package. Check the total grams (g) of carbs in one serving. Check the number of grams of saturated fats and trans fats in one serving. Choose foods that have a low amount or none of these fats. Check the number of milligrams (mg) of salt (sodium) in one serving. Most people should limit total sodium intake to less than 2,300 mg per day. Always check the nutrition information of foods labeled as "low-fat" or "nonfat." These foods may be higher in added sugar or refined carbs and should be avoided. Talk to your dietitian to identify your daily goals for nutrients listed on the label. Shopping Avoid buying canned, pre-made, or processed foods. These foods tend to be high in fat, sodium, and added sugar. Shop around the outside edge of the grocery store. This is where you will most often find fresh fruits and vegetables, bulk grains, fresh meats, and fresh dairy products. Cooking Use low-heat cooking methods, such as baking, instead of high-heat cooking methods, such as deep frying. Cook using healthy oils, such as olive, canola, or sunflower oil. Avoid cooking with butter, cream, or high-fat meats. Meal planning Eat meals and snacks regularly, preferably at the same times every day. Avoid going  long periods of time without eating. Eat foods that are high in fiber, such as fresh fruits, vegetables, beans, and whole grains. Eat 4-6 oz (112-168 g) of lean protein each day, such as lean meat, chicken, fish, eggs, or tofu. One ounce (oz) (28 g) of lean protein is equal to: 1 oz (28 g) of meat, chicken, or fish. 1 egg.  cup (62 g) of tofu. Eat some foods each day that contain healthy fats, such as avocado, nuts, seeds, and fish. What foods should I eat? Fruits Berries. Apples. Oranges. Peaches. Apricots. Plums. Grapes. Mangoes. Papayas. Pomegranates. Kiwi. Cherries. Vegetables Leafy greens, including lettuce, spinach, kale, chard, collard greens, mustard greens, and cabbage. Beets. Cauliflower. Broccoli. Carrots. Green beans. Tomatoes. Peppers. Onions. Cucumbers. Brussels sprouts. Grains Whole grains, such as whole-wheat or whole-grain bread, crackers, tortillas, cereal, and pasta. Unsweetened oatmeal. Quinoa. Brown or wild rice. Meats and other proteins Seafood. Poultry without skin. Lean cuts of poultry and beef. Tofu. Nuts. Seeds. Dairy Low-fat or fat-free dairy products such as milk, yogurt, and cheese. The items listed above may not be a complete list of foods and beverages you can eat and drink. Contact a dietitian for more information.  What foods should I avoid? Fruits Fruits canned with syrup. Vegetables Canned vegetables. Frozen vegetables with butter or cream sauce. Grains Refined white flour and flour products such as bread, pasta, snack foods, and cereals. Avoid all processed foods. Meats and other proteins Fatty cuts of meat. Poultry with skin. Breaded or fried meats. Processed meat. Avoid saturated fats. Dairy Full-fat yogurt, cheese, or milk. Beverages Sweetened drinks, such as soda or iced tea. The items listed above may not be a complete list of foods and beverages you should avoid. Contact a dietitian for more information. Questions to ask a health care  provider Do I need to meet with a certified diabetes care and education specialist? Do I need to meet with a dietitian? What number can I call if I have questions? When are the best times to check my blood glucose? Where to find more information: American Diabetes Association: diabetes.org Academy of Nutrition and Dietetics: eatright.[employer] of Diabetes and Digestive and Kidney Diseases: [employer] Association of Diabetes Care & Education Specialists: diabeteseducator.org Summary It is important to have healthy eating habits because your blood sugar (glucose) levels are greatly affected by what you eat and drink. It is important to use alcohol carefully. A healthy meal plan will help you manage your blood glucose and lower your risk of heart disease. Your health care provider may recommend that you work with a dietitian to make a meal plan that is best for you. This information is not intended to replace advice given to you by your health care provider. Make sure you discuss any questions you have with your health care provider. Document Revised: 01/01/2020 Document Reviewed: 01/01/2020 Elsevier Patient Education  [place], MD [facility]

## 2022-06-01 NOTE — Patient Instructions (Signed)

## 2022-06-01 NOTE — Assessment & Plan Note (Signed)
Stable.  Follows up with pain management clinic on a regular basis On long-term opiate use

## 2022-06-01 NOTE — Assessment & Plan Note (Signed)
Well-controlled hypertension. Continue hydrochlorothiazide 25 mg Well-controlled diabetes with hemoglobin A1c of 6.6. Sees endocrinologist on a regular basis. Continue Farxiga 10 mg daily, metformin 1000 mg twice a day, Ozempic 2 mg weekly and NovoLog insulin Diet and nutrition discussed. Cardiovascular risks associated with diabetes and hypertension discussed Follow up in 6 months.

## 2022-06-07 ENCOUNTER — Other Ambulatory Visit (HOSPITAL_COMMUNITY): Payer: Self-pay

## 2022-06-07 DIAGNOSIS — Z79899 Other long term (current) drug therapy: Secondary | ICD-10-CM | POA: Diagnosis not present

## 2022-06-09 DIAGNOSIS — E1140 Type 2 diabetes mellitus with diabetic neuropathy, unspecified: Secondary | ICD-10-CM | POA: Diagnosis not present

## 2022-06-10 ENCOUNTER — Telehealth: Payer: Self-pay | Admitting: *Deleted

## 2022-06-10 NOTE — Patient Outreach (Signed)
  Care Coordination   Follow Up Visit Note   06/10/2022 Name: [name] MRN: [id number] DOB: 03/12/1961  [name] is a 61 y.o. year old female who sees Sagardia, Ines Bloomer, MD for primary care. I spoke with  [name] by phone today.  What matters to the patients health and wellness today?  Starting the new year out right!    Goals Addressed               This Visit's Progress     Patient Stated     Improve my diet by carb counting to help manage my DM and lose weight. (pt-stated)   On track     Care Coordination Interventions: Assessed pt progress in counting her carb intake and monitoring serving sizes: pt reports she is more aware now since she started looking at labels and had made some adjustments in her diet choices and serving sizes. Her wt has gone up to 244 however.  Encouraged her desire to increase her walking times and getting back to the gym in January.        Other     LDL CALC < 100        THN NOTE: [name] reports she is not taking ezetimibe or rosuvastatin: Sent Dr. Franky Macho instant message.        SDOH assessments and interventions completed:  Yes   Previously assessed.  Care Coordination Interventions:  Yes, provided   Follow up plan: Follow up call scheduled for one month.    Encounter Outcome:  Pt. Visit Completed   Kayleen Memos C. Myrtie Neither, MSN, [facility] Gerontological Nurse Practitioner [facility] Care Management [phone]

## 2022-06-19 ENCOUNTER — Other Ambulatory Visit: Payer: Self-pay | Admitting: Hematology and Oncology

## 2022-07-01 DIAGNOSIS — I10 Essential (primary) hypertension: Secondary | ICD-10-CM | POA: Diagnosis not present

## 2022-07-01 DIAGNOSIS — M545 Low back pain, unspecified: Secondary | ICD-10-CM | POA: Diagnosis not present

## 2022-07-01 DIAGNOSIS — Z0000 Encounter for general adult medical examination without abnormal findings: Secondary | ICD-10-CM | POA: Diagnosis not present

## 2022-07-01 DIAGNOSIS — E119 Type 2 diabetes mellitus without complications: Secondary | ICD-10-CM | POA: Diagnosis not present

## 2022-07-01 DIAGNOSIS — Z79899 Other long term (current) drug therapy: Secondary | ICD-10-CM | POA: Diagnosis not present

## 2022-07-01 DIAGNOSIS — R768 Other specified abnormal immunological findings in serum: Secondary | ICD-10-CM | POA: Diagnosis not present

## 2022-07-01 DIAGNOSIS — M542 Cervicalgia: Secondary | ICD-10-CM | POA: Diagnosis not present

## 2022-07-01 DIAGNOSIS — E559 Vitamin D deficiency, unspecified: Secondary | ICD-10-CM | POA: Diagnosis not present

## 2022-07-01 DIAGNOSIS — M5136 Other intervertebral disc degeneration, lumbar region: Secondary | ICD-10-CM | POA: Diagnosis not present

## 2022-07-05 DIAGNOSIS — Z79899 Other long term (current) drug therapy: Secondary | ICD-10-CM | POA: Diagnosis not present

## 2022-07-08 ENCOUNTER — Telehealth: Payer: Self-pay | Admitting: *Deleted

## 2022-07-08 ENCOUNTER — Encounter: Payer: Self-pay | Admitting: *Deleted

## 2022-07-08 DIAGNOSIS — G4733 Obstructive sleep apnea (adult) (pediatric): Secondary | ICD-10-CM | POA: Diagnosis not present

## 2022-07-08 NOTE — Patient Outreach (Signed)
Care Coordination   Follow Up Visit Note   07/08/2022 Name: [name] MRN: [id number] DOB: 1960/06/14  [name] is a 62 y.o. year old female who sees [name] for primary care. I spoke with  [name] by phone today.  What matters to the patients health and wellness today?  DO WHAT I CAN TO IMPROVE MY HEALTH.    Goals Addressed               This Visit's Progress     Patient Stated     Exercise 3x per week (30 min per time) (pt-stated)   On track     Care Coordination Interventions: Provided verbal and/or written education to patient re: provider recommended life style modifications  Reviewed recommended dietary changes: avoid fad diets, make small/incremental dietary and exercise changes, eat at the table and avoid eating in front of the TV, plan management of cravings, monitor snacking and cravings in food diary PT REPORTS SHE IS WALKING INTENTIONALLY 15 MINUTES 5 DAYS A WEEK. WE DISCUSSED THE IMPORTANCE OF EXERCISE FOR IMPROVING HER GLUCOSE CONTROL AND TO ASSIST WITH WT LOSS. REINFORCED BEING MINDFUL WHEN SHE IS WALKING FOR EXERCISE.        Improve my diet by carb counting to help manage my DM and lose weight. (pt-stated)   On track     Care Coordination Interventions: Provided verbal and/or written education to patient re: provider recommended life style modifications  Reviewed recommended dietary changes: avoid fad diets, make small/incremental dietary and exercise changes, eat at the table and avoid eating in front of the TV, plan management of cravings, monitor snacking and cravings in food diary        Other     HEMOGLOBIN A1C < 7.0        Care Coordination Interventions: Reviewed medications with patient and discussed importance of medication adherence Discussed plans with patient for ongoing care management follow up and provided patient with direct contact information for care management team Review of patient status, including review of  consultants reports, relevant laboratory and other test results, and medications completed  CELEBRATED WITH PT FOR HER LAST HBG A1C OF 6.6!!!!!!          LDL CALC < 100        THN NOTE: [name] reports she is not taking ezetimibe or rosuvastatin. SENT DR. SAGARDIA TODAY'S NOTE FOR HIS REVIEW AND INTERVENTION. PT SAID SOMEONE TOOK HER OFF THE EZETIMIBE. SHE ASKED IF SHE COULD BE ON REPATHA?  LIPID PANEL 06/01/22: CHOL 284        TRIG  162                                         HDL    44.9                                         LDL    207       Interventions Today    Flowsheet Row Most Recent Value  Chronic Disease Discussed/Reviewed   Chronic disease discussed/reviewed during today's visit Diabetes  General Adult Interventions   General Interventions Discussed/Reviewed General Interventions Reviewed, Annual Eye Exam, Labs, Annual Foot Exam, Lipid Profile, Durable Medical Equipment (DME), Vaccines, Health Screening, Doctor Visits  Labs Hgb A1c every 3 months, Kidney Function  Vaccines COVID-19, Flu, Pneumonia, RSV, Shingles, Tetanus/Pertussis/Diphtheria  Doctor Visits Discussed/Reviewed Doctor Visits Reviewed, Annual Wellness Visits, PCP  [NOTIFY OF ELEVATED LIPID PROFILE AND NEED FOR ALTERNATE [place] MEVACOR.]  PCP/Specialist Visits Compliance with follow-up visit  Exercise Interventions   Exercise Discussed/Reviewed Exercise Discussed, Exercise Reviewed, Physical Activity, Weight Managment  Physical Activity Discussed/Reviewed Physical Activity Discussed, Types of exercise  Weight Management Weight loss  Education Interventions   Education Provided Provided Printed Education  Nutrition Interventions   Nutrition Discussed/Reviewed Nutrition Discussed, Carbohydrate meal planning       SDOH assessments and interventions completed:  Yes   PREVIOUSLY ADDRESSED.  Care Coordination Interventions:  Yes, provided   Follow up plan: Follow up call scheduled for 1 MONTH     Encounter Outcome:  Pt. Visit Completed   Crystal Memos C. Myrtie Neither, MSN, [facility] Gerontological Nurse Practitioner [facility] Care Management [phone]

## 2022-07-13 ENCOUNTER — Other Ambulatory Visit: Payer: Self-pay | Admitting: Internal Medicine

## 2022-07-14 IMAGING — MG MM DIGITAL DIAGNOSTIC UNILAT*R* W/ TOMO W/ CAD
6 series · 6 of 18 positions shown · non-contrast
Comparison: Previous exam(s).

CLINICAL DATA: 60-year-old female presenting as a recall from
screening for possible right breast mass.

EXAM:
DIGITAL DIAGNOSTIC UNILATERAL RIGHT MAMMOGRAM WITH TOMOSYNTHESIS AND
CAD; ULTRASOUND RIGHT BREAST LIMITED
TECHNIQUE: Right digital diagnostic mammography and breast tomosynthesis was
performed. The images were evaluated with computer-aided detection.;
Targeted ultrasound examination of the right breast was performed

[R ML synth-2D (1 of 2)]
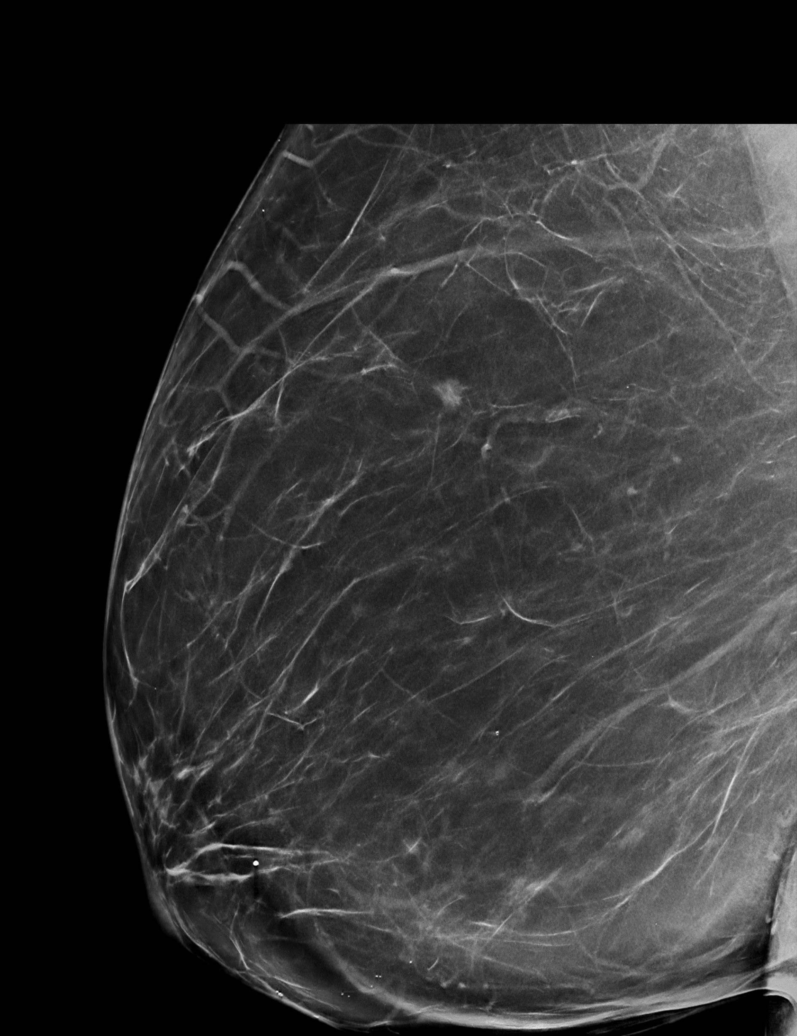

[R CC synth-2D]
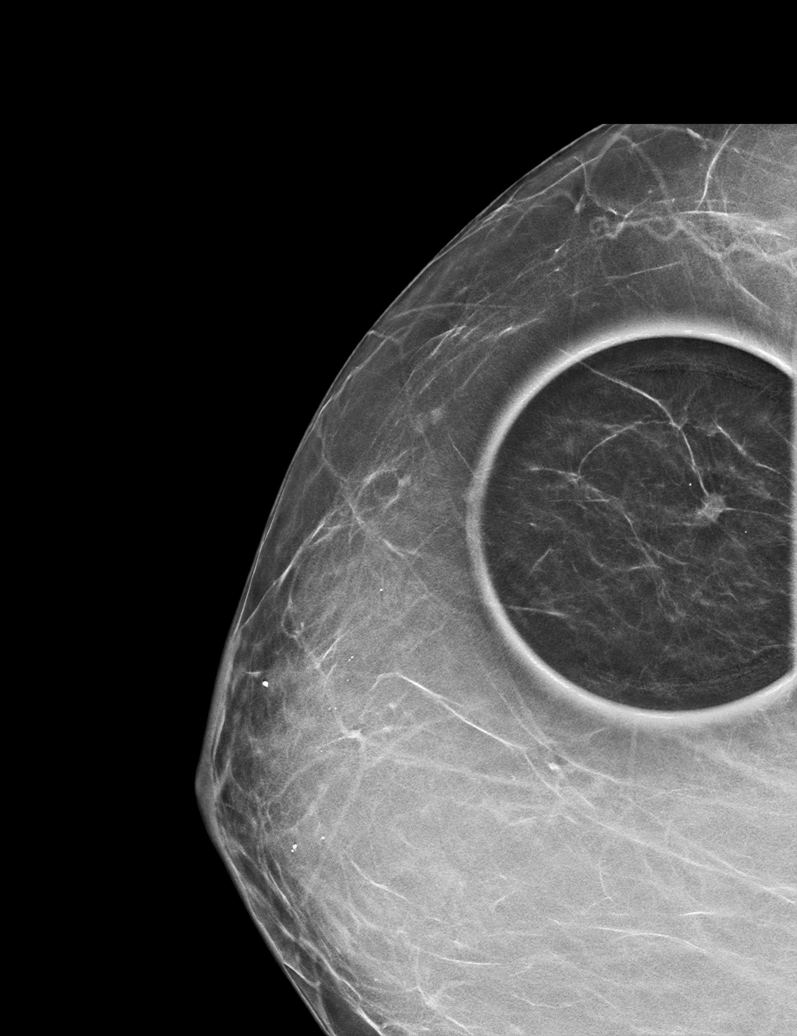

[R ML synth-2D (2 of 2)]
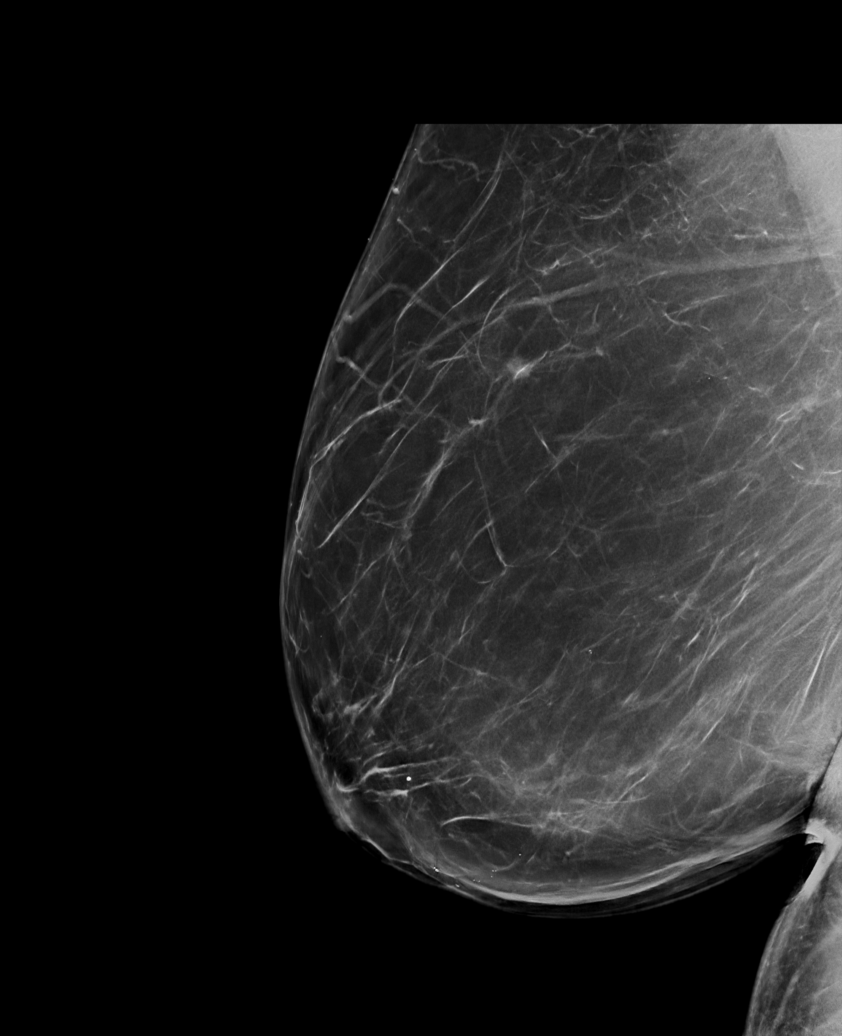

[R CC tomo · tomo slice 25/49.0]
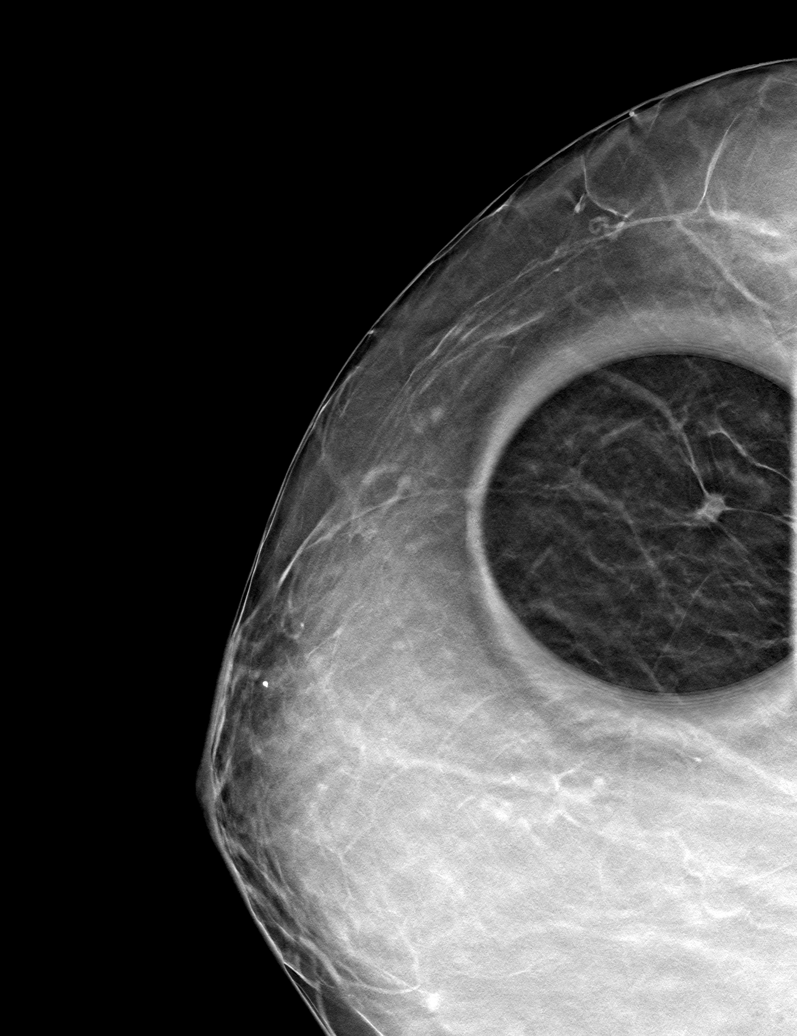

[R ML tomo (1 of 2) · tomo slice 42/83.0]
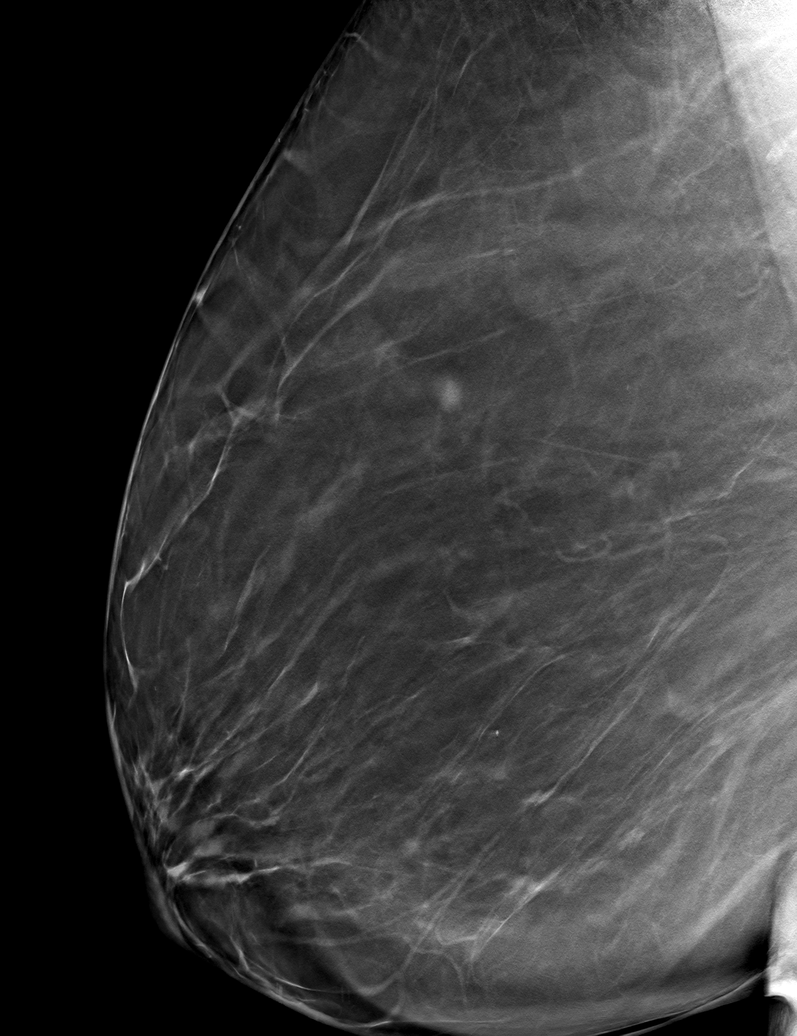

[R ML tomo (2 of 2) · tomo slice 49/98.0]
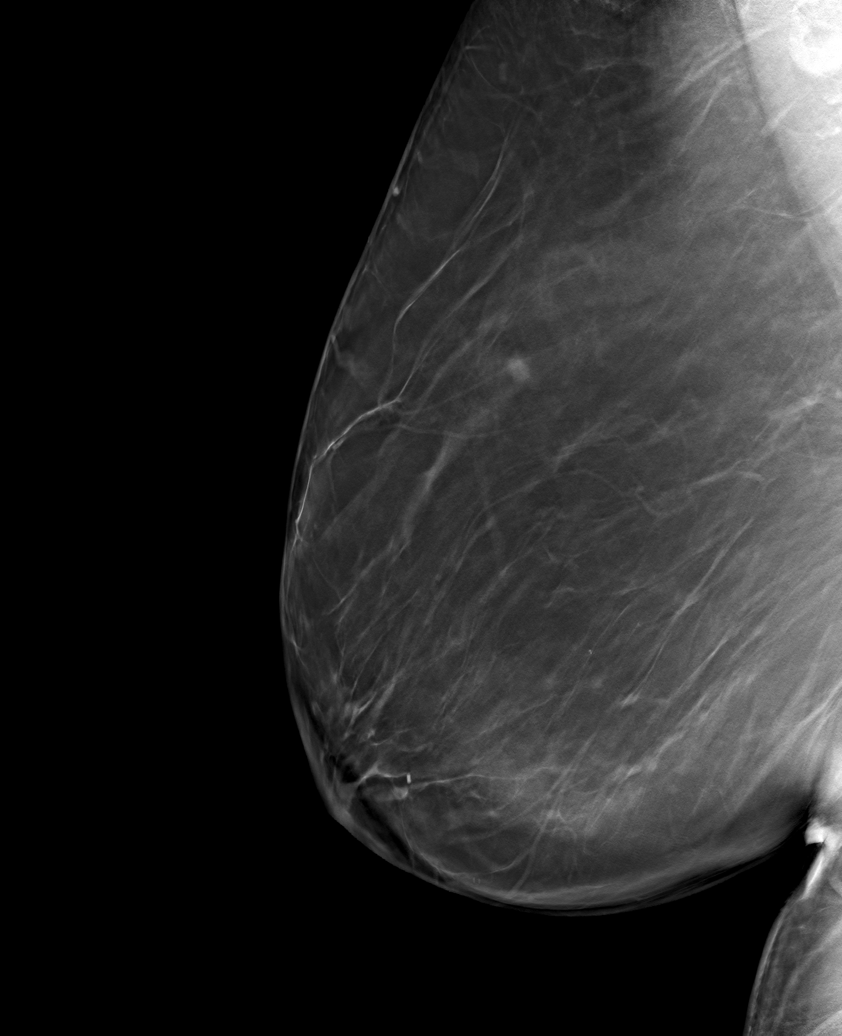

[6 of 18 positions shown; findings below may reference images not displayed]

ACR Breast Density Category b: There are scattered areas of
fibroglandular density.
FINDINGS: Mammogram:

Spot compression tomosynthesis cc and full field mL tomosynthesis
views of the right breast were performed. There is persistence
irregular mass with spiculation in the upper-outer quadrant the
right breast measuring approximately 0.6 cm.

Ultrasound:

Targeted ultrasound performed in the right breast at 10 o'clock 8 cm
from the nipple demonstrating an irregular hypoechoic mass with
indistinct margins measuring 0.7 x 0.4 x 0.5 cm. This corresponds to
the mammographic finding.

Targeted ultrasound the right axilla demonstrates normal lymph
nodes.
IMPRESSION: Suspicious mass in the right breast at 10 o'clock measuring 0.7 cm.

RECOMMENDATION:
Ultrasound-guided core needle biopsy of the right breast mass at 10
o'clock.

I have discussed the findings and recommendations with the patient.
If applicable, a reminder letter will be sent to the patient
regarding the next appointment.

BI-RADS CATEGORY  4: Suspicious.

## 2022-07-26 ENCOUNTER — Other Ambulatory Visit: Payer: Self-pay | Admitting: Internal Medicine

## 2022-07-29 DIAGNOSIS — R768 Other specified abnormal immunological findings in serum: Secondary | ICD-10-CM | POA: Diagnosis not present

## 2022-07-29 DIAGNOSIS — M542 Cervicalgia: Secondary | ICD-10-CM | POA: Diagnosis not present

## 2022-07-29 DIAGNOSIS — E119 Type 2 diabetes mellitus without complications: Secondary | ICD-10-CM | POA: Diagnosis not present

## 2022-07-29 DIAGNOSIS — Z79899 Other long term (current) drug therapy: Secondary | ICD-10-CM | POA: Diagnosis not present

## 2022-07-29 DIAGNOSIS — M5136 Other intervertebral disc degeneration, lumbar region: Secondary | ICD-10-CM | POA: Diagnosis not present

## 2022-07-29 DIAGNOSIS — E559 Vitamin D deficiency, unspecified: Secondary | ICD-10-CM | POA: Diagnosis not present

## 2022-07-29 DIAGNOSIS — I10 Essential (primary) hypertension: Secondary | ICD-10-CM | POA: Diagnosis not present

## 2022-07-29 DIAGNOSIS — M545 Low back pain, unspecified: Secondary | ICD-10-CM | POA: Diagnosis not present

## 2022-08-02 DIAGNOSIS — Z79899 Other long term (current) drug therapy: Secondary | ICD-10-CM | POA: Diagnosis not present

## 2022-08-05 ENCOUNTER — Encounter: Payer: Self-pay | Admitting: *Deleted

## 2022-08-05 ENCOUNTER — Telehealth: Payer: Self-pay | Admitting: *Deleted

## 2022-08-05 NOTE — Patient Outreach (Signed)
  Care Coordination   08/05/2022 Name: [name] MRN: [id number] DOB: June 25, 1960   Care Coordination Outreach Attempts:  An unsuccessful telephone outreach was attempted today to offer the patient information about available care coordination services as a benefit of their health plan.   Follow Up Plan:  Additional outreach attempts will be made to offer the patient care coordination information and services.   Encounter Outcome:  No Answer   Care Coordination Interventions:  No, not indicated    SIG Samarah Hogle C. Myrtie Neither, MSN, [facility] Gerontological Nurse Practitioner [facility] Care Management [phone]

## 2022-08-07 DIAGNOSIS — G4733 Obstructive sleep apnea (adult) (pediatric): Secondary | ICD-10-CM | POA: Diagnosis not present

## 2022-08-15 IMAGING — MG MM BREAST SURGICAL SPECIMEN
1 series · 1 of 1 positions shown · non-contrast
Comparison: Previous exam(s).

CLINICAL DATA: Surgical specimen post excision of a right breast
lesion after radioactive seed localization.

EXAM:
SPECIMEN RADIOGRAPH OF THE RIGHT BREAST

[R]
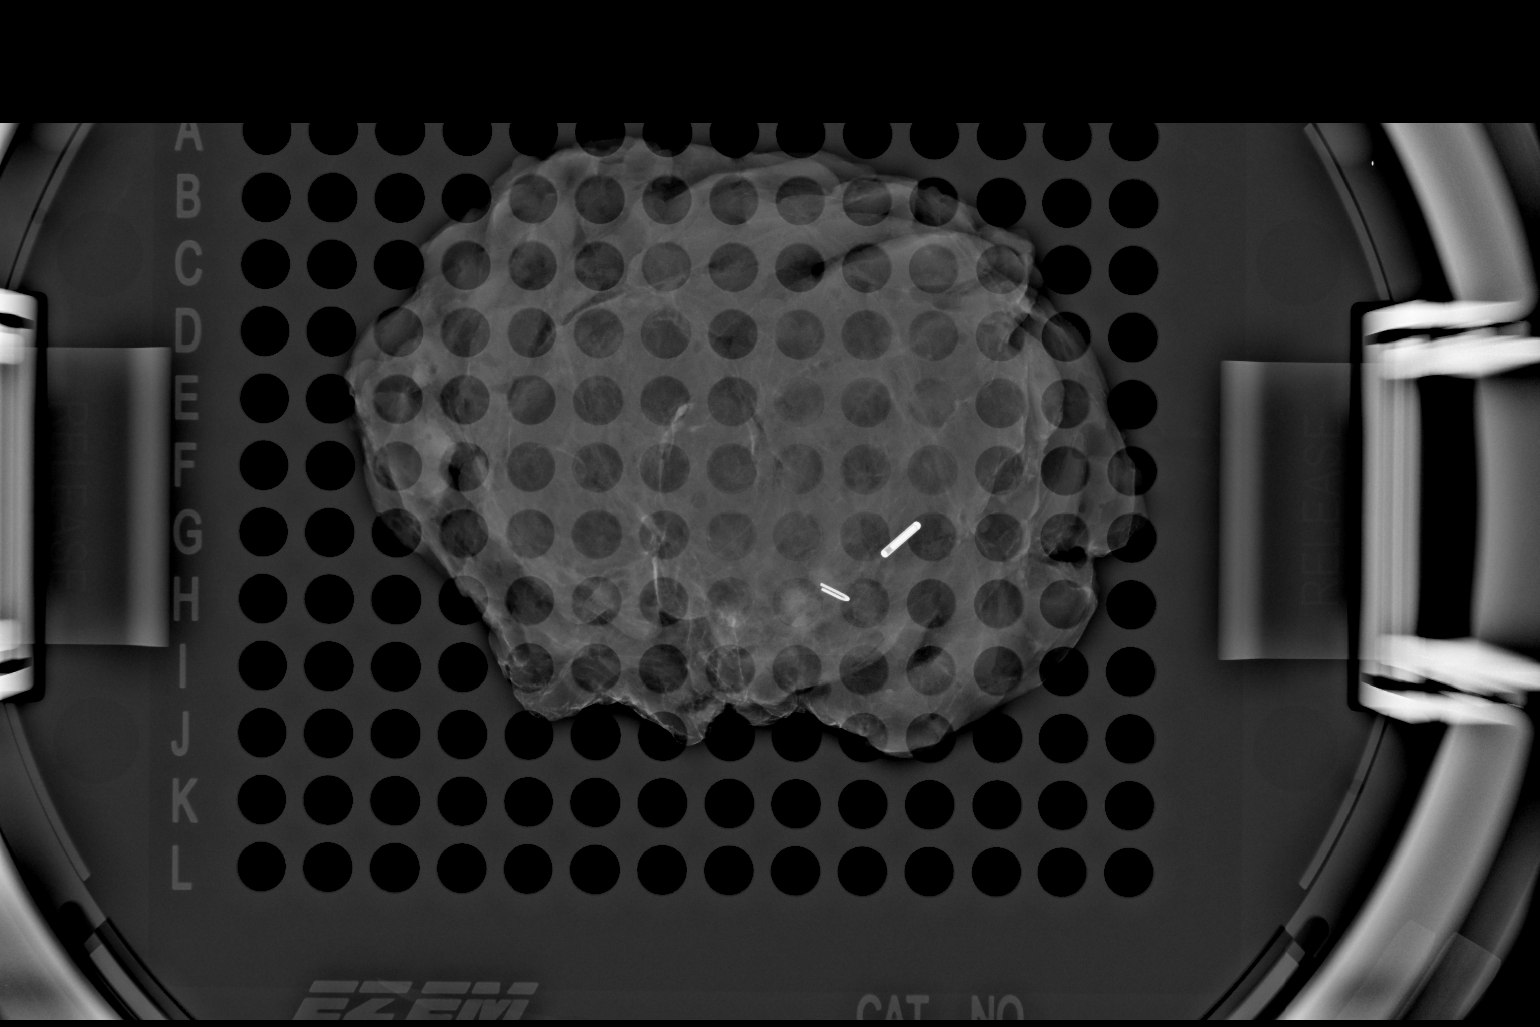

[1 of 1 positions shown; findings below may reference images not displayed]

FINDINGS: Status post excision of the right breast. The radioactive seed and
biopsy marker clip are present, completely intact, and were marked
for pathology.
IMPRESSION: Specimen radiograph of the right breast.

## 2022-08-30 DIAGNOSIS — E119 Type 2 diabetes mellitus without complications: Secondary | ICD-10-CM | POA: Diagnosis not present

## 2022-08-30 DIAGNOSIS — Z79899 Other long term (current) drug therapy: Secondary | ICD-10-CM | POA: Diagnosis not present

## 2022-08-30 DIAGNOSIS — M542 Cervicalgia: Secondary | ICD-10-CM | POA: Diagnosis not present

## 2022-08-30 DIAGNOSIS — M545 Low back pain, unspecified: Secondary | ICD-10-CM | POA: Diagnosis not present

## 2022-08-30 DIAGNOSIS — M5136 Other intervertebral disc degeneration, lumbar region: Secondary | ICD-10-CM | POA: Diagnosis not present

## 2022-08-30 DIAGNOSIS — E559 Vitamin D deficiency, unspecified: Secondary | ICD-10-CM | POA: Diagnosis not present

## 2022-08-30 DIAGNOSIS — R768 Other specified abnormal immunological findings in serum: Secondary | ICD-10-CM | POA: Diagnosis not present

## 2022-08-30 DIAGNOSIS — I10 Essential (primary) hypertension: Secondary | ICD-10-CM | POA: Diagnosis not present

## 2022-09-01 ENCOUNTER — Other Ambulatory Visit: Payer: Self-pay | Admitting: Adult Health

## 2022-09-01 DIAGNOSIS — E1140 Type 2 diabetes mellitus with diabetic neuropathy, unspecified: Secondary | ICD-10-CM | POA: Diagnosis not present

## 2022-09-01 DIAGNOSIS — Z79899 Other long term (current) drug therapy: Secondary | ICD-10-CM | POA: Diagnosis not present

## 2022-09-01 DIAGNOSIS — I10 Essential (primary) hypertension: Secondary | ICD-10-CM

## 2022-09-05 NOTE — Progress Notes (Unsigned)
Name: [name]  Age/ Sex: 62 y.o., female   MRN/ DOB: [id number], [birth year]     PCP: Horald Pollen, MD   Reason for Endocrinology Evaluation: Type 2 Diabetes Mellitus  Initial Endocrine Consultative Visit: 03/18/2019    PATIENT IDENTIFIER: [name] is a 62 y.o. female with a past medical history of T2DM, HTN, OSA and dyslipidemia , breast CA(Dx 02/2021) status post right lumpectomy chemo and radiation. The patient has followed with Endocrinology clinic since 03/18/2019 for consultative assistance with management of her diabetes.  DIABETIC HISTORY:  [name] was diagnosed with T2DM many years ago. Has been on Soliqua, Glipizide and V-Go was started in 2019.  Her hemoglobin A1c has ranged from 8.1% in 2016, peaking at 9.8% in 2020.  On her initial visit to our clinic, she was on V-Go 40 with Humulin U-500 , Metformin, and Ozempic with an A1c 9.7% . We stopped the V-Go  And the U-500 due to recurrent hypoglycemia . We started Novolog Mix and continued metformin and Ozempic.   SGLT-2 inhibitor started through PCP 03/2020  SUBJECTIVE:   During the last visit (03/08/2022): A1c 7.8%   Today (09/06/2022): [name] is here for a follow up on her diabetes care.  She checks her blood sugars multiple times daily, through freestyle libre. The patient has not  had hypoglycemic episodes since the last clinic visit.   She continues to follow-up with oncology for Breast Ca that was diagnosed 02/2021, S/P lumpectomy , and radiation  Denies diarrhea  Has hand numbness , since breast cancer dx , started on Lyrica through her pain clinic as well as  started on Vitamin B12 3000 mcg daily   This patient with type 2 diabetes is treated with Omnipod  (insulin pump). During the visit the pump basal and bolus doses were reviewed including carb/insulin rations and supplemental doses. The clinical list was updated. The glucose meter download was reviewed in detail to determine if the current  pump settings are providing the best glycemic control without excessive hypoglycemia.  Pump and meter download:    Pump   Omnipod Settings   Insulin type   Novolog    Basal rate       0000 3u/h       I:C ratio       0000 1:1    Enter# 12 gwith breakfast, 14 g with lunch and Supper               Sensitivity       0000  20      Goal       0000  120            Type & Model of Pump: Omnipod Insulin Type: Currently using Novolog .  Body mass index is 41.54 kg/m.  PUMP STATISTICS: Average BG: 167 BG Readings: 1.5/ day Average Daily Carbs (g): 21.4 Average Total Daily Insulin: 83.8 Average Daily Basal:61.3( 73%) Average Daily Bolus: 22.5(27 %)   HOME DIABETES REGIMEN:  Metformin 1000 tablet Twice daily  Ozempic 2 mg weekly ( Fridays) Farxiga 10 mg daily  Novolog     CONTINUOUS GLUCOSE MONITORING RECORD INTERPRETATION    Dates of Recording: 3/13-3/26/2024 Sensor description: [employer]  Results statistics:   CGM use % of time 61  Average and SD 135/22.4  Time in range 91 %  % Time Above 180 9  % Time above 250 0  % Time Below target 0  Glycemic patterns summary: OPtimal BG's during the night and most of the day   Hypoglycemic episodes occurred N/A  Hyperglycemia : Postprandial at times  Overnight periods: stable      DIABETIC COMPLICATIONS: Microvascular complications:  Neuropathy  Denies: CKD, retinopathy  Last eye exam: Completed 12/07/2021   Macrovascular complications:    Denies: CAD, PVD, CVA    HISTORY:  Past Medical History:  Past Medical History:  Diagnosis Date   Allergy    Arthritis    back    Asthma    AS CHILD   Cancer ([place])    breast CA- Right,   Chronic back pain    Chronic leg pain    due to back pain   Cocaine abuse ([place])    in remission   COPD (chronic obstructive pulmonary disease) ([place])    Diabetes ([place])    with neuropathy   GERD (gastroesophageal reflux disease)    Hyperlipidemia     Hypertension    Internal hemorrhoids    Intraductal papilloma of left breast 02/18/2016   Neuromuscular disorder ([facility])    neuropathy   Personal history of radiation therapy    Postlaminectomy syndrome of lumbar region 12/07/2011   RECTAL BLEEDING 12/09/2008   Annotation: s/p EGD 7/08 mild gastritis, s/p colonoscopy 7/08- benign polyp  s/p polypectomy and isolated diverticulum.  Qualifier: Diagnosis of  By: Ditzler RN, Debra     Sciatica    per patient    Sleep apnea    CPAP    YEARS AGO DONE 1/2 YEARS AGO AND WAS TOLD DID NOT HAVE   Tobacco abuse    Tubular adenoma of colon    Uterine fibroid    s/p hysterectomy   Past Surgical History:  Past Surgical History:  Procedure Laterality Date   BACK SURGERY  2012   L5-S1 microendoscopic disectomy last surgery 06/2011   BREAST BIOPSY     LEFT    01/19/16   BREAST BIOPSY Right 02/16/2021   BREAST EXCISIONAL BIOPSY Left 02/2016   BREAST LUMPECTOMY Right 03/12/2021   BREAST LUMPECTOMY WITH RADIOACTIVE SEED AND SENTINEL LYMPH NODE BIOPSY Right 03/12/2021   Procedure: RIGHT BREAST LUMPECTOMY WITH RADIOACTIVE SEED AND SENTINEL LYMPH NODE BIOPSY;  Surgeon: Jovita Kussmaul, MD;  Location: [place] OR;  Service: General;  Laterality: Right;   BREAST REDUCTION SURGERY  1982   CATARACT EXTRACTION Right 11/07/2019   COLONOSCOPY     COLONOSCOPY WITH PROPOFOL N/A 05/19/2022   Procedure: COLONOSCOPY WITH PROPOFOL;  Surgeon: Ladene Artist, MD;  Location: [facility];  Service: Gastroenterology;  Laterality: N/A;   HAND SURGERY     MIDDLE TRIGGER FINGER RIGHT SIDE   POLYPECTOMY  05/19/2022   Procedure: POLYPECTOMY;  Surgeon: Ladene Artist, MD;  Location: [facility];  Service: Gastroenterology;;   RADIOACTIVE SEED GUIDED EXCISIONAL BREAST BIOPSY Left 02/18/2016   Procedure: LEFT RADIOACTIVE SEED GUIDED EXCISIONAL BREAST BIOPSY;  Surgeon: Alphonsa Overall, MD;  Location: [place];  Service: General;  Laterality: Left;   REDUCTION MAMMAPLASTY Bilateral     TONSILLECTOMY  2008   TOTAL ABDOMINAL HYSTERECTOMY  05/24/2007   hysterectomy   TUBAL LIGATION     UPPER GASTROINTESTINAL ENDOSCOPY     Social History:  reports that she quit smoking about 26 years ago. Her smoking use included cigarettes. She has a 10.00 pack-year smoking history. She has never used smokeless tobacco. She reports that she does not currently use alcohol. She reports that she does not currently use drugs after  having used the following drugs: Cocaine and Marijuana. Family History:  Family History  Problem Relation Age of Onset   Diabetes Father    Heart disease Father    Hypertension Father    Diabetes Mother    Cancer Mother        brain   Hypertension Mother    Kidney disease Sister    Diabetes Sister    Kidney cancer Sister    Stroke Sister    Diabetes Sister    Hypertension Sister    Diabetes Sister    Hypertension Sister    Obesity Son    Colon cancer Neg Hx    Colon polyps Neg Hx    Esophageal cancer Neg Hx    Rectal cancer Neg Hx    Stomach cancer Neg Hx      HOME MEDICATIONS: Allergies as of 09/06/2022       Reactions   Breo Ellipta [fluticasone Furoate-vilanterol] Other (See Comments)   Urinary retention   Hydrochlorothiazide    Hypokalemia    Lisinopril Cough        Medication List        Accurate as of September 06, 2022  9:16 AM. If you have any questions, ask your nurse or doctor.          STOP taking these medications    gabapentin 800 MG tablet Commonly known as: NEURONTIN Stopped by: Dorita Sciara, MD   hydrochlorothiazide 25 MG tablet Commonly known as: HYDRODIURIL Stopped by: Dorita Sciara, MD       TAKE these medications    acyclovir 400 MG tablet Commonly known as: ZOVIRAX Take 1 tablet (400 mg total) by mouth 4 (four) times daily.   albuterol 108 (90 Base) MCG/ACT inhaler Commonly known as: VENTOLIN HFA Inhale 2 puffs into the lungs every 6 (six) hours as needed for wheezing or shortness of  breath.   AMBULATORY NON FORMULARY MEDICATION Take 30 mLs by mouth as needed (Severe abdominal pain). Medication Name: GI Cocktail: 270 mLViscous Xylocain 2%, 270 mL Dicyclomione10 mg/5 mL, 810 mL Mylanta or Maylox,   B-12 PO Take 1 tablet by mouth daily.   Buprenorphine HCl 750 MCG Film Place 1 strip inside cheek 2 (two) times daily as needed (pain).   Continuous Glucose Monitor Devi Use as directed.   dapagliflozin propanediol 10 MG Tabs tablet Commonly known as: Farxiga Take 1 tablet (10 mg total) by mouth daily.   diclofenac Sodium 1 % Gel Commonly known as: VOLTAREN Apply 2 g topically 4 (four) times daily. Rub into affected area of foot 2 to 4 times daily What changed:  when to take this reasons to take this additional instructions   dicyclomine 10 MG capsule Commonly known as: BENTYL TAKE 1 CAPSULE BY MOUTH THREE TIMES DAILY BEFORE MEAL(S)   Elderberry Immune Complex Chew Chew 1 tablet by mouth daily.   esomeprazole 40 MG capsule Commonly known as: NexIUM Take 1 capsule (40 mg total) by mouth 2 (two) times daily before a meal. Take 30 minutes before breakfast and dinner.   ezetimibe 10 MG tablet Commonly known as: Zetia Take 1 tablet (10 mg total) by mouth daily.   famotidine 20 MG tablet Commonly known as: PEPCID Take 1 tablet (20 mg total) by mouth 2 (two) times daily as needed.   FreeStyle Libre 2 Sensor Misc Change sensor every 14 days   FREESTYLE TEST STRIPS test strip Generic drug: glucose blood 1 each by Other route 3 (three) times  daily.   INSULIN SYRINGE 1CC/30GX1/2" 30G X 1/2" 1 ML Misc Use to fill Vgo daily   letrozole 2.5 MG tablet Commonly known as: FEMARA Take 1 tablet by mouth once daily   metFORMIN 1000 MG tablet Commonly known as: GLUCOPHAGE Take 1 tablet (1,000 mg total) by mouth 2 (two) times daily with a meal.   montelukast 10 MG tablet Commonly known as: SINGULAIR Take 1 tablet (10 mg total) by mouth at bedtime.    multivitamin with minerals Tabs tablet Take 1 tablet by mouth daily.   Narcan 4 MG/0.1ML Liqd nasal spray kit Generic drug: naloxone Place 0.4 mg into the nose once.   NovoLOG 100 UNIT/ML injection Generic drug: insulin aspart MAX DAILY 120 UNITS  DX E11.65   Omnipod DASH Intro (Gen 4) Kit 1 Device by Does not apply route every 3 (three) days.   OneTouch Delica Lancets Fine Misc Check blood sugar 3 times a day   OneTouch Verio Flex System w/Device Kit 1 each by Does not apply route 3 (three) times daily.   oxyCODONE-acetaminophen 10-325 MG tablet Commonly known as: PERCOCET Take 1 tablet by mouth every 8 (eight) hours as needed for pain.   Ozempic (2 MG/DOSE) 8 MG/3ML Sopn Generic drug: Semaglutide (2 MG/DOSE) Inject 2 mg into the skin once a week.   pregabalin 75 MG capsule Commonly known as: LYRICA Take 75 mg by mouth 2 (two) times daily.   rosuvastatin 10 MG tablet Commonly known as: Crestor Take 1 tablet (10 mg total) by mouth daily.   Symbicort 160-4.5 MCG/ACT inhaler Generic drug: budesonide-formoterol Inhale 2 puffs by mouth twice daily   tiZANidine 4 MG capsule Commonly known as: ZANAFLEX Take 4 mg by mouth 3 (three) times daily as needed for muscle spasms.   valACYclovir 500 MG tablet Commonly known as: VALTREX TAKE 1 TABLET BY MOUTH TWICE DAILY FOR 5 DAYS AT  TIME  OF  OUTBREAK   VITAMIN D3 PO Take 1 tablet by mouth daily.         OBJECTIVE:   Vital Signs: Ht 5\' 4"  (1.626 m)   Wt 242 lb (109.8 kg)   BMI 41.54 kg/m   Wt Readings from Last 3 Encounters:  09/06/22 242 lb (109.8 kg)  06/01/22 242 lb (109.8 kg)  05/19/22 244 lb (110.7 kg)     Exam: General:  NAD  Lungs: Clear with good BS bilat with no rales, rhonchi, or wheezes  Heart: RRR with normal S1 and S2 and no gallops; no murmurs; no rub  Extremities: No pretibial edema.     DM foot exam: 09/06/2022  The skin of the feet is intact without sores or ulcerations. The pedal  pulses are 2+ on right and 2+ on left. The sensation is intact to a screening 5.07, 10 gram monofilament bilaterally          DATA REVIEWED:  Lab Results  Component Value Date   HGBA1C 6.7 (A) 09/06/2022   HGBA1C 6.6 (A) 06/01/2022   HGBA1C 7.8 (A) 03/08/2022    Latest Reference Range & Units 06/01/22 10:45  Sodium 135 - 145 mEq/L 141  Potassium 3.5 - 5.1 mEq/L 3.3 (L)  Chloride 96 - 112 mEq/L 97  CO2 19 - 32 mEq/L 35 (H)  Glucose 70 - 99 mg/dL 111 (H)  BUN 6 - 23 mg/dL 9  Creatinine 0.40 - 1.20 mg/dL 0.76  Calcium 8.4 - 10.5 mg/dL 10.3  Alkaline Phosphatase 39 - 117 U/L 65  Albumin 3.5 -  5.2 g/dL 4.3  AST 0 - 37 U/L 26  ALT 0 - 35 U/L 24  Total Protein 6.0 - 8.3 g/dL 7.8  Total Bilirubin 0.2 - 1.2 mg/dL 0.3  GFR >60.00 mL/min 84.34    ASSESSMENT / PLAN / RECOMMENDATIONS:   1) Type 2 Diabetes Mellitus, Optimally  Controlled , With Neuropathic complications - Most recent A1c of 6.7%. Goal A1c < 7.0 %.    -I have praised the patient improved glycemic control -In reviewing her CGM/pump data, the patient has been noted with tight BG's overnight, will reduce her basal rate as below between the hours of 0000 and 8 am - She will continue to enter  #12 grams  with a full breakfast, #14 for lunch and supper   MEDICATIONS: - Continue Metformin 1000 mg,  1 tablet Twice daily  - Continue Ozempic 2 mg weekly  - Continue Farxiga 10 mg daily  - Novolog per pump     Pump   Omnipod Settings   Insulin type   Novolog    Basal rate       0000 2.7 u/h    0800 3.0  I:C ratio       0000 1:1    Enter# 12 gwith breakfast, 14 g with lunch and Supper               Sensitivity       0000  20      Goal       0000  120           EDUCATION / INSTRUCTIONS: BG monitoring instructions: Patient is instructed to check her blood sugars 2 times a day, fasting and bedtime . Call [place] [facility] clinic if: BG persistently < 70  I reviewed the Rule of 15 for the treatment of  hypoglycemia in detail with the patient. Literature supplied.   2. Dyslipidemia :   - She was on Atrovastatin at somepoint but developed leg pains and we switched to Rosuvastatin and Zetia - She has not been taking her cholsterol meds, discussed importance of compliance and risk of CVA and CAD  - A year supply was provided    Medication  Restart rosuvastatin 10 mg daily  Restart  Zetia 10 mg daily   3. Hypokalemia   - Pt has been noted with Hypokalemia  - Will stop HCTZ  - If this persists , will consider ruling out secondary causes   F/U in  6 months    Signed electronically by: Mack Guise, MD  [facility] Endocrinology  [facility] [address] [place], [place] [address] Phone: [phone] FAX: [phone]   CC: [address], [place] [address] Phone: [phone]  Fax: [phone]  Return to Endocrinology clinic as below: Future Appointments  Date Time Provider [place]  09/16/2022  8:45 AM Trula Slade, DPM [facility]  11/29/2022  9:00 AM Deneise Lever, MD [facility] None  03/09/2023 11:30 AM Nicholas Lose, MD [facility] None  03/10/2023  9:10 AM Aldridge Krzyzanowski, Melanie Crazier, MD [facility] None

## 2022-09-06 ENCOUNTER — Encounter: Payer: Self-pay | Admitting: Internal Medicine

## 2022-09-06 ENCOUNTER — Ambulatory Visit (INDEPENDENT_AMBULATORY_CARE_PROVIDER_SITE_OTHER): Payer: PPO | Admitting: Internal Medicine

## 2022-09-06 VITALS — Ht 64.0 in | Wt 242.0 lb

## 2022-09-06 DIAGNOSIS — E1140 Type 2 diabetes mellitus with diabetic neuropathy, unspecified: Secondary | ICD-10-CM

## 2022-09-06 DIAGNOSIS — Z794 Long term (current) use of insulin: Secondary | ICD-10-CM

## 2022-09-06 DIAGNOSIS — E1165 Type 2 diabetes mellitus with hyperglycemia: Secondary | ICD-10-CM

## 2022-09-06 DIAGNOSIS — E785 Hyperlipidemia, unspecified: Secondary | ICD-10-CM | POA: Diagnosis not present

## 2022-09-06 LAB — POCT GLYCOSYLATED HEMOGLOBIN (HGB A1C): Hemoglobin A1C: 6.7 % — AB (ref 4.0–5.6)

## 2022-09-06 MED ORDER — OZEMPIC (2 MG/DOSE) 8 MG/3ML ~~LOC~~ SOPN: 2.0000 mg | PEN_INJECTOR | SUBCUTANEOUS | 3 refills | Status: DC

## 2022-09-06 MED ORDER — DAPAGLIFLOZIN PROPANEDIOL 10 MG PO TABS: 10.0000 mg | ORAL_TABLET | Freq: Every day | ORAL | 3 refills | Status: DC

## 2022-09-06 MED ORDER — METFORMIN HCL 1000 MG PO TABS: 1000.0000 mg | ORAL_TABLET | Freq: Two times a day (BID) | ORAL | 0 refills | Status: DC

## 2022-09-06 MED ORDER — ROSUVASTATIN CALCIUM 10 MG PO TABS: 10.0000 mg | ORAL_TABLET | Freq: Every day | ORAL | 3 refills | Status: DC

## 2022-09-06 MED ORDER — EZETIMIBE 10 MG PO TABS: 10.0000 mg | ORAL_TABLET | Freq: Every day | ORAL | 11 refills | Status: DC

## 2022-09-06 NOTE — Patient Instructions (Signed)
-   Enter 12 grams with Breakfast, 14 grams with Lunch and Supper  - Continue Metformin 1 tablet Twice daily  - Continue  Ozempic 2  mg weekly  - Continue  Farxiga 10 mg , 1 tablet with breakfast     HOW TO TREAT LOW BLOOD SUGARS (Blood sugar LESS THAN 70 MG/DL) Please follow the RULE OF 15 for the treatment of hypoglycemia treatment (when your (blood sugars are less than 70 mg/dL)   STEP 1: Take 15 grams of carbohydrates when your blood sugar is low, which includes:  3-4 GLUCOSE TABS  OR 3-4 OZ OF JUICE OR REGULAR SODA OR ONE TUBE OF GLUCOSE GEL    STEP 2: RECHECK blood sugar in 15 MINUTES STEP 3: If your blood sugar is still low at the 15 minute recheck --> then, go back to STEP 1 and treat AGAIN with another 15 grams of carbohydrates. 

## 2022-09-07 DIAGNOSIS — G4733 Obstructive sleep apnea (adult) (pediatric): Secondary | ICD-10-CM | POA: Diagnosis not present

## 2022-09-12 ENCOUNTER — Other Ambulatory Visit (HOSPITAL_COMMUNITY): Payer: Self-pay

## 2022-09-16 ENCOUNTER — Ambulatory Visit (INDEPENDENT_AMBULATORY_CARE_PROVIDER_SITE_OTHER): Payer: PPO | Admitting: Podiatry

## 2022-09-16 ENCOUNTER — Ambulatory Visit (INDEPENDENT_AMBULATORY_CARE_PROVIDER_SITE_OTHER): Payer: PPO

## 2022-09-16 DIAGNOSIS — M7752 Other enthesopathy of left foot: Secondary | ICD-10-CM

## 2022-09-16 DIAGNOSIS — M19072 Primary osteoarthritis, left ankle and foot: Secondary | ICD-10-CM

## 2022-09-16 DIAGNOSIS — M19071 Primary osteoarthritis, right ankle and foot: Secondary | ICD-10-CM

## 2022-09-16 DIAGNOSIS — E119 Type 2 diabetes mellitus without complications: Secondary | ICD-10-CM | POA: Diagnosis not present

## 2022-09-16 MED ORDER — TRIAMCINOLONE ACETONIDE 10 MG/ML IJ SUSP
10.0000 mg | Freq: Once | INTRAMUSCULAR | Status: AC
  Administered 2022-09-16: 10 mg

## 2022-09-16 NOTE — Progress Notes (Unsigned)
Subjective: Chief Complaint  Patient presents with   Arthritis    est - bil ankle pain - mainly after taking off shoes at night. She is a breast cancer survivor x 1 year - she is diabetic and has some numbness and tingling in her feet and hands   62 year old female presents the office today with concerns of bilateral ankle pain.  She gets most of her pain at the end of the day after being on her feet.  No recent injuries that she reports.  Occasional swelling.  No redness or warmth.  No treatments of the saw her last.  Her A1c has improved and has lost 6.6 on June 01, 2022 No open lesions that she reports. She does report numbness and tingling to her feet but also her hands.  Objective: AAO x3, NAD DP/PT pulses palpable bilaterally, CRT less than 3 seconds Sensation decreased with Semmes Weinstein monofilament. There is tenderness palpation along the anterior medial ankle gutter bilaterally + left side worse than right.  There is no area of pinpoint tenderness.  Trace edema.  No erythema or warmth.  There is no pain or crepitation with ankle joint range of motion. No pain with calf compression, swelling, warmth, erythema  Assessment: 62 year old female capsulitis bilateral ankles left side worse than right  Plan: -All treatment options discussed with the patient including all alternatives, risks, complications.  -X-rays were obtained reviewed of bilateral ankles.  There is no evidence of acute fracture.  Joint space maintained. -She was proceed with steroid injection.  This was performed into medial aspect left ankle.  See procedure note below. -If symptoms persist consider advanced imaging.  She had MRI performed last year that is overall unremarkable. -Bracing as needed -Consider physical therapy -Continue Lyrica for neuropathy -.  Inspection given neuropathy. -Patient encouraged to call the office with any questions, concerns, change in symptoms.   Procedure: Injection  Intermediate joint Discussed alternatives, risks, complications and verbal consent was obtained.  Location: Left anterior medial ankle joint Skin Prep: Betadine/alchol. Injectate: 0.5cc 0.5% marcaine plain, 0.5 cc 2% lidocaine plain and, 1 cc kenalog 10. Disposition: Patient tolerated procedure well. Injection site dressed with a band-aid.  Post-injection care was discussed and return precautions discussed.   No follow-ups on file.  Vivi Barrack DPM

## 2022-09-22 DIAGNOSIS — Z6841 Body Mass Index (BMI) 40.0 and over, adult: Secondary | ICD-10-CM | POA: Diagnosis not present

## 2022-09-22 DIAGNOSIS — M5136 Other intervertebral disc degeneration, lumbar region: Secondary | ICD-10-CM | POA: Diagnosis not present

## 2022-09-22 DIAGNOSIS — Z79899 Other long term (current) drug therapy: Secondary | ICD-10-CM | POA: Diagnosis not present

## 2022-09-22 DIAGNOSIS — R768 Other specified abnormal immunological findings in serum: Secondary | ICD-10-CM | POA: Diagnosis not present

## 2022-09-22 DIAGNOSIS — M542 Cervicalgia: Secondary | ICD-10-CM | POA: Diagnosis not present

## 2022-09-22 DIAGNOSIS — M545 Low back pain, unspecified: Secondary | ICD-10-CM | POA: Diagnosis not present

## 2022-09-25 ENCOUNTER — Other Ambulatory Visit: Payer: Self-pay | Admitting: Internal Medicine

## 2022-09-26 ENCOUNTER — Ambulatory Visit: Payer: Self-pay | Admitting: *Deleted

## 2022-09-26 NOTE — Patient Outreach (Signed)
  Care Coordination   Follow Up Visit Note   09/26/2022 Name: [name] MRN: [id number] DOB: June 15, 1960  [name] is a 62 y.o. year old female who sees Sagardia, Eilleen Kempf, MD for primary care. I spoke with  [name] by phone today.  What matters to the patients health and wellness today?  Continuing on my journey to better health.    Goals Addressed               This Visit's Progress     Patient Stated     Exercise 3x per week (30 min per time) (pt-stated)   On track     Interventions Today    Flowsheet Row Most Recent Value  Chronic Disease   Chronic disease during today's visit Hypertension (HTN), Diabetes, Other  [Obesity]  General Interventions   General Interventions Discussed/Reviewed General Interventions Discussed, General Interventions Reviewed, Durable Medical Equipment (DME)  [Pt taken of HCTZ by endocrinology. Pt does not have a home BP monitor. [facility] to supply. Pt to begin monitoring daily for the next month to ensure her BP is stable without medication.]  Exercise Interventions   Exercise Discussed/Reviewed Exercise Discussed, Exercise Reviewed, Physical Activity, Weight Managment  [Wt unchanged. Improved exercise time to 15 min each day she works. Discussed importance of exercise for HTN, DM, Wt loss and general well-being.]  Education Interventions   Education Provided Provided Education  Provided Verbal Education On Nutrition, Blood Sugar Monitoring, Exercise, Medication, When to see the doctor  Nutrition Interventions   Nutrition Discussed/Reviewed Nutrition Discussed, Carbohydrate meal planning, Portion sizes, Decreasing salt  Pharmacy Interventions   Pharmacy Dicussed/Reviewed Pharmacy Topics Reviewed  [HCTZ dc'd, Pregabalin started.]  Advanced Directive Interventions   Advanced Directives Discussed/Reviewed Advanced Directives Reviewed  [Reminded pt to make copies of her documents, provide to designated HCPOA with discussion regarding her wishes,  copy to MD.]            Improve my diet by carb counting to help manage my DM and lose weight.(starting wt:244) (pt-stated)   On track     Care Coordination Interventions: Provided verbal and/or written education to patient re: provider recommended life style modifications  Reviewed recommended dietary changes: avoid fad diets, make small/incremental dietary and exercise changes, eat at the table and avoid eating in front of the TV, plan management of cravings, monitor snacking and cravings in food diary        Other     Blood Pressure < 140/90        Needs BP cuff - ordered via [facility] Care Manager.      HEMOGLOBIN A1C < 7.0        Currrent Hgb A1C 6.6!!!!!      LDL CALC < 100        Pt now taking her lipid lower meds.        SDOH assessments and interventions completed:  Yes   Previously assessed.  Care Coordination Interventions:  Yes, provided   Follow up plan:  Follow up to be scheduled and call made by new [facility] Care Manager.    Encounter Outcome:  Pt. Visit Completed   Noralyn Pick C. Burgess Estelle, MSN, [facility] Gerontological Nurse Practitioner [facility] Care Management [phone]

## 2022-09-27 ENCOUNTER — Ambulatory Visit (INDEPENDENT_AMBULATORY_CARE_PROVIDER_SITE_OTHER): Payer: PPO

## 2022-09-27 ENCOUNTER — Other Ambulatory Visit: Payer: PPO

## 2022-09-27 DIAGNOSIS — M2041 Other hammer toe(s) (acquired), right foot: Secondary | ICD-10-CM

## 2022-09-27 DIAGNOSIS — Z79899 Other long term (current) drug therapy: Secondary | ICD-10-CM | POA: Diagnosis not present

## 2022-09-27 DIAGNOSIS — M2042 Other hammer toe(s) (acquired), left foot: Secondary | ICD-10-CM

## 2022-09-27 DIAGNOSIS — E1165 Type 2 diabetes mellitus with hyperglycemia: Secondary | ICD-10-CM

## 2022-09-27 NOTE — Progress Notes (Signed)
Patient presents to the office today for diabetic shoe and insole measuring.  Patient was measured with brannock device to determine size and width for 1 pair of extra depth shoes and foam casted for 3 pair of insoles.   ABN signed.   Documentation of medical necessity will be sent to patient's treating diabetic doctor to verify and sign.   Patient's diabetic provider: DR [facility]  Shoes and insoles will be ordered at that time and patient will be notified for an appointment for fitting when they arrive.   Brannock measurement: 10  Patient shoe selection-   1st   Shoe choice:   [id number]   Shoe size ordered: 28M

## 2022-09-28 ENCOUNTER — Ambulatory Visit (INDEPENDENT_AMBULATORY_CARE_PROVIDER_SITE_OTHER): Payer: PPO | Admitting: Emergency Medicine

## 2022-09-28 ENCOUNTER — Encounter: Payer: Self-pay | Admitting: Emergency Medicine

## 2022-09-28 VITALS — BP 138/88 | HR 71 | Temp 98.1°F | Wt 244.5 lb

## 2022-09-28 DIAGNOSIS — K760 Fatty (change of) liver, not elsewhere classified: Secondary | ICD-10-CM

## 2022-09-28 DIAGNOSIS — Z79891 Long term (current) use of opiate analgesic: Secondary | ICD-10-CM | POA: Diagnosis not present

## 2022-09-28 DIAGNOSIS — E785 Hyperlipidemia, unspecified: Secondary | ICD-10-CM

## 2022-09-28 DIAGNOSIS — M542 Cervicalgia: Secondary | ICD-10-CM | POA: Diagnosis not present

## 2022-09-28 DIAGNOSIS — M7989 Other specified soft tissue disorders: Secondary | ICD-10-CM | POA: Diagnosis not present

## 2022-09-28 DIAGNOSIS — I152 Hypertension secondary to endocrine disorders: Secondary | ICD-10-CM

## 2022-09-28 DIAGNOSIS — J449 Chronic obstructive pulmonary disease, unspecified: Secondary | ICD-10-CM | POA: Diagnosis not present

## 2022-09-28 DIAGNOSIS — E1159 Type 2 diabetes mellitus with other circulatory complications: Secondary | ICD-10-CM | POA: Diagnosis not present

## 2022-09-28 DIAGNOSIS — E1169 Type 2 diabetes mellitus with other specified complication: Secondary | ICD-10-CM | POA: Diagnosis not present

## 2022-09-28 MED ORDER — VALSARTAN 80 MG PO TABS: 80.0000 mg | ORAL_TABLET | Freq: Every day | ORAL | 3 refills | Status: DC

## 2022-09-28 NOTE — Assessment & Plan Note (Signed)
Diet and nutrition discussed. 

## 2022-09-28 NOTE — Assessment & Plan Note (Signed)
Due to chronic pain syndrome Under the care of pain management

## 2022-09-28 NOTE — Assessment & Plan Note (Signed)
Secondary to lymphadenopathy

## 2022-09-28 NOTE — Assessment & Plan Note (Signed)
On the left side of neck.  Needs soft tissue ultrasound. Most likely lymphadenopathy

## 2022-09-28 NOTE — Assessment & Plan Note (Signed)
Stable and well-controlled No recent use of albuterol inhaler

## 2022-09-28 NOTE — Assessment & Plan Note (Signed)
Chronic stable condition Continue rosuvastatin 10 mg and Zetia 10 mg

## 2022-09-28 NOTE — Patient Instructions (Signed)
Start valsartan 80 mg daily. Schedule soft tissue ultrasound of neck as discussed  Hypertension, Adult High blood pressure (hypertension) is when the force of blood pumping through the arteries is too strong. The arteries are the blood vessels that carry blood from the heart throughout the body. Hypertension forces the heart to work harder to pump blood and may cause arteries to become narrow or stiff. Untreated or uncontrolled hypertension can lead to a heart attack, heart failure, a stroke, kidney disease, and other problems. A blood pressure reading consists of a higher number over a lower number. Ideally, your blood pressure should be below 120/80. The first ("top") number is called the systolic pressure. It is a measure of the pressure in your arteries as your heart beats. The second ("bottom") number is called the diastolic pressure. It is a measure of the pressure in your arteries as the heart relaxes. What are the causes? The exact cause of this condition is not known. There are some conditions that result in high blood pressure. What increases the risk? Certain factors may make you more likely to develop high blood pressure. Some of these risk factors are under your control, including: Smoking. Not getting enough exercise or physical activity. Being overweight. Having too much fat, sugar, calories, or salt (sodium) in your diet. Drinking too much alcohol. Other risk factors include: Having a personal history of heart disease, diabetes, high cholesterol, or kidney disease. Stress. Having a family history of high blood pressure and high cholesterol. Having obstructive sleep apnea. Age. The risk increases with age. What are the signs or symptoms? High blood pressure may not cause symptoms. Very high blood pressure (hypertensive crisis) may cause: Headache. Fast or irregular heartbeats (palpitations). Shortness of breath. Nosebleed. Nausea and vomiting. Vision changes. Severe chest  pain, dizziness, and seizures. How is this diagnosed? This condition is diagnosed by measuring your blood pressure while you are seated, with your arm resting on a flat surface, your legs uncrossed, and your feet flat on the floor. The cuff of the blood pressure monitor will be placed directly against the skin of your upper arm at the level of your heart. Blood pressure should be measured at least twice using the same arm. Certain conditions can cause a difference in blood pressure between your right and left arms. If you have a high blood pressure reading during one visit or you have normal blood pressure with other risk factors, you may be asked to: Return on a different day to have your blood pressure checked again. Monitor your blood pressure at home for 1 week or longer. If you are diagnosed with hypertension, you may have other blood or imaging tests to help your health care provider understand your overall risk for other conditions. How is this treated? This condition is treated by making healthy lifestyle changes, such as eating healthy foods, exercising more, and reducing your alcohol intake. You may be referred for counseling on a healthy diet and physical activity. Your health care provider may prescribe medicine if lifestyle changes are not enough to get your blood pressure under control and if: Your systolic blood pressure is above 130. Your diastolic blood pressure is above 80. Your personal target blood pressure may vary depending on your medical conditions, your age, and other factors. Follow these instructions at home: Eating and drinking  Eat a diet that is high in fiber and potassium, and low in sodium, added sugar, and fat. An example of this eating plan is called the DASH  diet. DASH stands for Dietary Approaches to Stop Hypertension. To eat this way: Eat plenty of fresh fruits and vegetables. Try to fill one half of your plate at each meal with fruits and vegetables. Eat whole  grains, such as whole-wheat pasta, brown rice, or whole-grain bread. Fill about one fourth of your plate with whole grains. Eat or drink low-fat dairy products, such as skim milk or low-fat yogurt. Avoid fatty cuts of meat, processed or cured meats, and poultry with skin. Fill about one fourth of your plate with lean proteins, such as fish, chicken without skin, beans, eggs, or tofu. Avoid pre-made and processed foods. These tend to be higher in sodium, added sugar, and fat. Reduce your daily sodium intake. Many people with hypertension should eat less than 1,500 mg of sodium a day. Do not drink alcohol if: Your health care provider tells you not to drink. You are pregnant, may be pregnant, or are planning to become pregnant. If you drink alcohol: Limit how much you have to: 0-1 drink a day for women. 0-2 drinks a day for men. Know how much alcohol is in your drink. In the [place] one drink equals one 12 oz bottle of beer (355 mL), one 5 oz glass of wine (148 mL), or one 1 oz glass of hard liquor (44 mL). Lifestyle  Work with your health care provider to maintain a healthy body weight or to lose weight. Ask what an ideal weight is for you. Get at least 30 minutes of exercise that causes your heart to beat faster (aerobic exercise) most days of the week. Activities may include walking, swimming, or biking. Include exercise to strengthen your muscles (resistance exercise), such as Pilates or lifting weights, as part of your weekly exercise routine. Try to do these types of exercises for 30 minutes at least 3 days a week. Do not use any products that contain nicotine or tobacco. These products include cigarettes, chewing tobacco, and vaping devices, such as e-cigarettes. If you need help quitting, ask your health care provider. Monitor your blood pressure at home as told by your health care provider. Keep all follow-up visits. This is important. Medicines Take over-the-counter and prescription  medicines only as told by your health care provider. Follow directions carefully. Blood pressure medicines must be taken as prescribed. Do not skip doses of blood pressure medicine. Doing this puts you at risk for problems and can make the medicine less effective. Ask your health care provider about side effects or reactions to medicines that you should watch for. Contact a health care provider if you: Think you are having a reaction to a medicine you are taking. Have headaches that keep coming back (recurring). Feel dizzy. Have swelling in your ankles. Have trouble with your vision. Get help right away if you: Develop a severe headache or confusion. Have unusual weakness or numbness. Feel faint. Have severe pain in your chest or abdomen. Vomit repeatedly. Have trouble breathing. These symptoms may be an emergency. Get help right away. Call 911. Do not wait to see if the symptoms will go away. Do not drive yourself to the hospital. Summary Hypertension is when the force of blood pumping through your arteries is too strong. If this condition is not controlled, it may put you at risk for serious complications. Your personal target blood pressure may vary depending on your medical conditions, your age, and other factors. For most people, a normal blood pressure is less than 120/80. Hypertension is treated with lifestyle changes,  medicines, or a combination of both. Lifestyle changes include losing weight, eating a healthy, low-sodium diet, exercising more, and limiting alcohol. This information is not intended to replace advice given to you by your health care provider. Make sure you discuss any questions you have with your health care provider. Document Revised: 04/06/2021 Document Reviewed: 04/06/2021 Elsevier Patient Education  [place].

## 2022-09-28 NOTE — Assessment & Plan Note (Signed)
Diet and nutrition discussed.  Advised to decrease amount of daily carbohydrate intake and daily calories and increase amount of plant-based protein in her diet. 

## 2022-09-28 NOTE — Assessment & Plan Note (Signed)
Presently on no medications Recommend to start valsartan 80 mg daily Cardiovascular risks associated with hypertension discussed Advised to continue monitoring blood pressure reading at home daily for the next several weeks and keep a log.  Advised to contact the office if numbers persistently abnormal. Well-controlled diabetes as managed by endocrinologist. Diet and nutrition discussed Benefits of exercise discussed

## 2022-09-28 NOTE — Progress Notes (Signed)
[name] 62 y.o.   Chief Complaint  Patient presents with   Hypertension    Patient states she is having some blood pressure issues, She was taking HCTZ, her endocrinologist took her off of that medication    Ear Pain    Patient is having left sided ear pain x 2 weeks     HISTORY OF PRESENT ILLNESS: [name] complaining of pain to the left ear/left neck area for the past 2 weeks Also here for follow-up of hypertension.  Presently on no medication.  Recently taken off of HCTZ due to hypokalemia issues. BP Readings from Last 3 Encounters:  06/01/22 128/74  05/19/22 (!) 197/69  03/20/22 (!) 151/88  Most recent endocrinologist office visit assessment and plan as follows: ASSESSMENT / PLAN / RECOMMENDATIONS:    1) Type 2 Diabetes Mellitus, Optimally  Controlled , With Neuropathic complications - Most recent A1c of 6.7%. Goal A1c < 7.0 %.     -I have praised the patient improved glycemic control -In reviewing her CGM/pump data, the patient has been noted with tight BG's overnight, will reduce her basal rate as below between the hours of 0000 and 8 am - She will continue to enter  #12 grams  with a full breakfast, #14 for lunch and supper    MEDICATIONS: - Continue Metformin 1000 mg,  1 tablet Twice daily  - Continue Ozempic 2 mg weekly  - Continue Farxiga 10 mg daily  - Novolog per pump   2. Dyslipidemia :     - She was on Atrovastatin at somepoint but developed leg pains and we switched to Rosuvastatin and Zetia - She has not been taking her cholsterol meds, discussed importance of compliance and risk of CVA and CAD  - A year supply was provided      Medication  Restart rosuvastatin 10 mg daily  Restart  Zetia 10 mg daily    3. Hypokalemia    - Pt has been noted with Hypokalemia  - Will stop HCTZ  - If this persists , will consider ruling out secondary causes    F/U in  6 months      Signed electronically by: Lyndle Herrlich, MD    [facility] End[facility]  [facility] [address] [place], [place] [address] Phone: [phone]   Hypertension Pertinent negatives include no chest pain, headaches, palpitations or shortness of breath.     Prior to Admission medications   Medication Sig Start Date End Date Taking? Authorizing Provider  acyclovir (ZOVIRAX) 400 MG tablet Take 1 tablet (400 mg total) by mouth 4 (four) times daily. 05/30/22  Yes Shontelle Muska, Eilleen Kempf, MD  albuterol (VENTOLIN HFA) 108 (90 Base) MCG/ACT inhaler Inhale 2 puffs into the lungs every 6 (six) hours as needed for wheezing or shortness of breath. 05/28/20  Yes Young, Joni Fears D, MD  Blood Glucose Monitoring Suppl (ONETOUCH VERIO FLEX SYSTEM) w/Device KIT 1 each by Does not apply route 3 (three) times daily. 01/12/17  Yes John Giovanni, MD  Buprenorphine HCl 750 MCG FILM Place 1 strip inside cheek 2 (two) times daily as needed (pain). 11/25/21  Yes [provider]  Cholecalciferol (VITAMIN D3 PO) Take 1 tablet by mouth daily.   Yes [provider]  Continuous Blood Gluc Sensor (FREESTYLE LIBRE 2 SENSOR) MISC Change sensor every 14 days 11/16/21  Yes Shamleffer, Konrad Dolores, MD  Continuous Glucose Monitor DEVI Use as directed. 03/28/17  Yes Rathore,  Ulyess Blossom, MD  Cyanocobalamin (B-12 PO) Take 1 tablet by mouth daily.   Yes [provider]  dapagliflozin propanediol (FARXIGA) 10 MG TABS tablet Take 1 tablet (10 mg total) by mouth daily. 09/06/22  Yes Shamleffer, Konrad Dolores, MD  diclofenac Sodium (VOLTAREN) 1 % GEL Apply 2 g topically 4 (four) times daily. Rub into affected area of foot 2 to 4 times daily Patient taking differently: Apply 2 g topically 4 (four) times daily as needed (pain). 01/14/22  Yes Vivi Barrack, DPM  dicyclomine (BENTYL) 10 MG capsule TAKE 1 CAPSULE BY MOUTH THREE TIMES DAILY BEFORE MEAL(S) 02/04/22  Yes Esterwood, Amy S, PA-C  esomeprazole (NEXIUM) 40 MG capsule Take 1  capsule (40 mg total) by mouth 2 (two) times daily before a meal. Take 30 minutes before breakfast and dinner. 02/04/22  Yes Esterwood, Amy S, PA-C  ezetimibe (ZETIA) 10 MG tablet Take 1 tablet (10 mg total) by mouth daily. 09/06/22 09/06/23 Yes Shamleffer, Konrad Dolores, MD  famotidine (PEPCID) 20 MG tablet Take 1 tablet (20 mg total) by mouth 2 (two) times daily as needed. 02/04/22  Yes Esterwood, Amy S, PA-C  glucose blood (FREESTYLE TEST STRIPS) test strip 1 each by Other route 3 (three) times daily. 09/06/21  Yes Shamleffer, Konrad Dolores, MD  Insulin Disposable Pump (OMNIPOD DASH INTRO, GEN 4,) KIT 1 Device by Does not apply route every 3 (three) days. 01/24/22  Yes Shamleffer, Konrad Dolores, MD  Insulin Disposable Pump (OMNIPOD DASH PODS, GEN 4,) MISC USE ONE POD EVERY ONE AND ONE-HALF DAYS. 09/26/22  Yes Shamleffer, Konrad Dolores, MD  Insulin Syringe-Needle U-100 (INSULIN SYRINGE 1CC/30GX1/2") 30G X 1/2" 1 ML MISC Use to fill Vgo daily 11/06/18  Yes Dorrell, Cathleen Corti, MD  letrozole [facility]) 2.5 MG tablet Take 1 tablet by mouth once daily 06/20/22  Yes Serena Croissant, MD  metFORMIN (GLUCOPHAGE) 1000 MG tablet Take 1 tablet (1,000 mg total) by mouth 2 (two) times daily with a meal. 09/06/22  Yes Shamleffer, Konrad Dolores, MD  Misc Natural Products (ELDERBERRY IMMUNE COMPLEX) CHEW Chew 1 tablet by mouth daily.   Yes [provider]  montelukast (SINGULAIR) 10 MG tablet Take 1 tablet (10 mg total) by mouth at bedtime. 12/03/21  Yes Glenford Bayley, NP  NARCAN 4 MG/0.1ML LIQD nasal spray kit Place 0.4 mg into the nose once. 12/06/18  Yes [provider]  NOVOLOG 100 UNIT/ML injection MAX DAILY 120 UNITS  DX E11.65 05/25/22  Yes Shamleffer, Konrad Dolores, MD  [facility] DELICA LANCETS FINE MISC Check blood sugar 3 times a day 06/22/16  Yes John Giovanni, MD  oxyCODONE-acetaminophen (PERCOCET) 10-325 MG tablet Take 1 tablet by mouth every 8 (eight) hours as needed for pain.  03/02/20  Yes [provider]  pregabalin (LYRICA) 75 MG capsule Take 75 mg by mouth 2 (two) times daily.   Yes [provider]  rosuvastatin (CRESTOR) 10 MG tablet Take 1 tablet (10 mg total) by mouth daily. 09/06/22  Yes Shamleffer, Konrad Dolores, MD  Semaglutide, 2 MG/DOSE, (OZEMPIC, 2 MG/DOSE,) 8 MG/3ML SOPN Inject 2 mg into the skin once a week. 09/06/22  Yes Shamleffer, Konrad Dolores, MD  SYMBICORT 160-4.5 MCG/ACT inhaler Inhale 2 puffs by mouth twice daily 07/29/22  Yes Young, Clinton D, MD  tiZANidine (ZANAFLEX) 4 MG capsule Take 4 mg by mouth 3 (three) times daily as needed for muscle spasms. 07/23/21  Yes [provider]  valACYclovir (VALTREX) 500 MG tablet TAKE 1 TABLET BY MOUTH TWICE  DAILY FOR 5 DAYS AT  TIME  OF  OUTBREAK 05/30/22  Yes Jayma Volpi, Eilleen Kempf, MD  Multiple Vitamin (MULTIVITAMIN WITH MINERALS) TABS tablet Take 1 tablet by mouth daily.    [provider]    Allergies  Allergen Reactions   Breo Ellipta [Fluticasone Furoate-Vilanterol] Other (See Comments)    Urinary retention   Hydrochlorothiazide     Hypokalemia    Lisinopril Cough    Patient Active Problem List   Diagnosis Date Noted   Personal history of colonic polyps 05/19/2022   Benign neoplasm of ascending colon 05/19/2022   Benign neoplasm of transverse colon 05/19/2022   Malignant neoplasm of upper-outer quadrant of right breast in female, estrogen receptor positive 02/22/2021   Body mass index (BMI) 40.0-44.9, adult 07/28/2020   Lumbago with sciatica, right side 07/28/2020   OSA on CPAP 10/31/2018   Stress incontinence in female 03/09/2017   Hepatic steatosis 08/29/2016   Herpes simplex 03/08/2016   Diabetic neuropathy with neurologic complication 10/20/2015   Long term current use of opiate analgesic 07/21/2015   COPD GOLD II  05/31/2013   Lung nodule < 6cm on CT 05/31/2013   Chronic cough 12/07/2012   Chronic pain syndrome 02/13/2012   Lumbar radiculopathy  11/29/2011   Type 2 diabetes mellitus with diabetic neuropathy 08/25/2011   Back pain 11/15/2010   Hypertension associated with diabetes 07/07/2009   GERD 01/08/2009   Dyslipidemia associated with type 2 diabetes mellitus 12/09/2008   Hyperlipidemia 12/09/2008   Morbid obesity due to excess calories 12/09/2008    Past Medical History:  Diagnosis Date   Allergy    Arthritis    back    Asthma    AS CHILD   Cancer (HCC)    breast CA- Right,   Chronic back pain    Chronic leg pain    due to back pain   Cocaine abuse (HCC)    in remission   COPD (chronic obstructive pulmonary disease) (HCC)    Diabetes (HCC)    with neuropathy   GERD (gastroesophageal reflux disease)    Hyperlipidemia    Hypertension    Internal hemorrhoids    Intraductal papilloma of left breast 02/18/2016   Neuromuscular disorder (HCC)    neuropathy   Personal history of radiation therapy    Postlaminectomy syndrome of lumbar region 12/07/2011   RECTAL BLEEDING 12/09/2008   Annotation: s/p EGD 7/08 mild gastritis, s/p colonoscopy 7/08- benign polyp  s/p polypectomy and isolated diverticulum.  Qualifier: Diagnosis of  By: Ditzler RN, Debra     Sciatica    per patient    Sleep apnea    CPAP    YEARS AGO DONE 1/2 YEARS AGO AND WAS TOLD DID NOT HAVE   Tobacco abuse    Tubular adenoma of colon    Uterine fibroid    s/p hysterectomy    Past Surgical History:  Procedure Laterality Date   BACK SURGERY  2012   L5-S1 microendoscopic disectomy last surgery 06/2011   BREAST BIOPSY     LEFT    01/19/16   BREAST BIOPSY Right 02/16/2021   BREAST EXCISIONAL BIOPSY Left 02/2016   BREAST LUMPECTOMY Right 03/12/2021   BREAST LUMPECTOMY WITH RADIOACTIVE SEED AND SENTINEL LYMPH NODE BIOPSY Right 03/12/2021   Procedure: RIGHT BREAST LUMPECTOMY WITH RADIOACTIVE SEED AND SENTINEL LYMPH NODE BIOPSY;  Surgeon: Griselda Miner, MD;  Location: [facility] OR;  Service: General;  Laterality: Right;   BREAST REDUCTION SURGERY  1982  CATARACT EXTRACTION Right 11/07/2019   COLONOSCOPY     COLONOSCOPY WITH PROPOFOL N/A 05/19/2022   Procedure: COLONOSCOPY WITH PROPOFOL;  Surgeon: Meryl Dare, MD;  Location: [facility] ENDOSCOPY;  Service: Gastroenterology;  Laterality: N/A;   HAND SURGERY     MIDDLE TRIGGER FINGER RIGHT SIDE   POLYPECTOMY  05/19/2022   Procedure: POLYPECTOMY;  Surgeon: Meryl Dare, MD;  Location: [facility];  Service: Gastroenterology;;   RADIOACTIVE SEED GUIDED EXCISIONAL BREAST BIOPSY Left 02/18/2016   Procedure: LEFT RADIOACTIVE SEED GUIDED EXCISIONAL BREAST BIOPSY;  Surgeon: Ovidio Kin, MD;  Location: [facility] OR;  Service: General;  Laterality: Left;   REDUCTION MAMMAPLASTY Bilateral    TONSILLECTOMY  2008   TOTAL ABDOMINAL HYSTERECTOMY  05/24/2007   hysterectomy   TUBAL LIGATION     UPPER GASTROINTESTINAL ENDOSCOPY      Social History   Socioeconomic History   Marital status: Married    Spouse name: Not on file   Number of children: 3   Years of education: Not on file   Highest education level: Not on file  Occupational History    Comment: [employer]  Tobacco Use   Smoking status: Former    Packs/day: 0.50    Years: 20.00    Additional pack years: 0.00    Total pack years: 10.00    Types: Cigarettes    Quit date: 07/23/1996    Years since quitting: 26.2   Smokeless tobacco: Never  Vaping Use   Vaping Use: Never used  Substance and Sexual Activity   Alcohol use: Not Currently    Comment: recovering addict clean for 9 years   Drug use: Not Currently    Types: Cocaine, Marijuana    Comment: recovering addict clean for 9 years   Sexual activity: Yes    Birth control/protection: Surgical  Other Topics Concern   Not on file  Social History Narrative   Current Social History 01/17/2020        Patient lives with spouse in a home which is 1 story. There are not steps up to the entrance the patient uses.       Patient's method of transportation is personal car.      The  highest level of education was some college.      The patient currently works part-time.      Identified important Relationships are God, husband, kids, grandkids       Pets : None       Interests / Fun: Drawing and nature       Current Stressors: Pain, Covid       Religious / Personal Beliefs: Christian       Other: None    Social Determinants of Health   Financial Resource Strain: Low Risk  (02/18/2022)   Overall Financial Resource Strain (CARDIA)    Difficulty of Paying Living Expenses: Not hard at all  Food Insecurity: No Food Insecurity (02/18/2022)   Hunger Vital Sign    Worried About Running Out of Food in the Last Year: Never true    Ran Out of Food in the Last Year: Never true  Transportation Needs: No Transportation Needs (02/18/2022)   PRAPARE - Administrator, Civil Service (Medical): No    Lack of Transportation (Non-Medical): No  Physical Activity: Insufficiently Active (02/18/2022)   Exercise Vital Sign    Days of Exercise per Week: 2 days    Minutes of Exercise per Session: 30 min  Stress: No Stress  Concern Present (02/18/2022)   [employer] of Occupational Health - Occupational Stress Questionnaire    Feeling of Stress : Not at all  Social Connections: Moderately Integrated (02/18/2022)   Social Connection and Isolation Panel [NHANES]    Frequency of Communication with Friends and Family: More than three times a week    Frequency of Social Gatherings with Friends and Family: More than three times a week    Attends Religious Services: More than 4 times per year    Active Member of [employer] or Organizations: No    Attends Banker Meetings: Never    Marital Status: Married  Catering manager Violence: Not At Risk (02/18/2022)   Humiliation, Afraid, Rape, and Kick questionnaire    Fear of Current or Ex-Partner: No    Emotionally Abused: No    Physically Abused: No    Sexually Abused: No    Family History  Problem Relation Age of Onset    Diabetes Father    Heart disease Father    Hypertension Father    Diabetes Mother    Cancer Mother        brain   Hypertension Mother    Kidney disease Sister    Diabetes Sister    Kidney cancer Sister    Stroke Sister    Diabetes Sister    Hypertension Sister    Diabetes Sister    Hypertension Sister    Obesity Son    Colon cancer Neg Hx    Colon polyps Neg Hx    Esophageal cancer Neg Hx    Rectal cancer Neg Hx    Stomach cancer Neg Hx      Review of Systems  Constitutional: Negative.  Negative for chills and fever.  HENT:  Positive for ear pain.   Respiratory: Negative.  Negative for shortness of breath.   Cardiovascular: Negative.  Negative for chest pain and palpitations.  Gastrointestinal:  Negative for abdominal pain, nausea and vomiting.  Genitourinary: Negative.   Neurological:  Negative for dizziness and headaches.  All other systems reviewed and are negative.   Today's Vitals   09/28/22 1348  BP: 138/88  Pulse: 71  Temp: 98.1 F (36.7 C)  TempSrc: Oral  SpO2: 97%  Weight: 244 lb 8 oz (110.9 kg)   Body mass index is 41.97 kg/m.   Physical Exam Vitals reviewed.  Constitutional:      Appearance: Normal appearance.  HENT:     Head: Normocephalic.     Mouth/Throat:     Mouth: Mucous membranes are moist.     Pharynx: Oropharynx is clear.  Eyes:     Extraocular Movements: Extraocular movements intact.     Pupils: Pupils are equal, round, and reactive to light.  Neck:     Comments: Left-sided tender soft tissue mass most likely adenopathy Cardiovascular:     Rate and Rhythm: Normal rate and regular rhythm.     Heart sounds: Normal heart sounds.  Pulmonary:     Effort: Pulmonary effort is normal.     Breath sounds: Normal breath sounds.  Skin:    General: Skin is warm and dry.  Neurological:     Mental Status: She is alert and oriented to person, place, and time.  Psychiatric:        Mood and Affect: Mood normal.        Behavior: Behavior  normal.      ASSESSMENT & PLAN: A total of 46 minutes was spent with the patient and counseling/coordination  of care regarding preparing for this visit, review of most recent office visit notes, review of most recent endocrinologist office visit notes, review of multiple chronic medical conditions under management, review of all medications and changes made, cardiovascular risks associated with uncontrolled hypertension and need to start medication, education on nutrition, evaluation of left neck soft tissue mass with ultrasound, prognosis, documentation, and need for follow-up.  Problem List Items Addressed This Visit       Cardiovascular and Mediastinum   Hypertension associated with diabetes - Primary    Presently on no medications Recommend to start valsartan 80 mg daily Cardiovascular risks associated with hypertension discussed Advised to continue monitoring blood pressure reading at home daily for the next several weeks and keep a log.  Advised to contact the office if numbers persistently abnormal. Well-controlled diabetes as managed by endocrinologist. Diet and nutrition discussed Benefits of exercise discussed      Relevant Medications   valsartan (DIOVAN) 80 MG tablet     Respiratory   COPD GOLD II     Stable and well-controlled No recent use of albuterol inhaler        Digestive   Hepatic steatosis (Chronic)    Diet and nutrition discussed        Endocrine   Dyslipidemia associated with type 2 diabetes mellitus    Chronic stable condition Continue rosuvastatin 10 mg and Zetia 10 mg      Relevant Medications   valsartan (DIOVAN) 80 MG tablet     Other   Long term current use of opiate analgesic (Chronic)    Due to chronic pain syndrome Under the care of pain management      Morbid obesity due to excess calories    Diet and nutrition discussed Advised to decrease amount of daily carbohydrate intake and daily calories and increase amount of plant-based  protein in her diet      Neck pain on left side    Secondary to lymphadenopathy      Relevant Orders   US Soft Tissue Head/Neck (NON-THYROID)   Soft tissue mass    On the left side of neck.  Needs soft tissue ultrasound. Most likely lymphadenopathy      Relevant Orders   US Soft Tissue Head/Neck (NON-THYROID)    Patient Instructions  Start valsartan 80 mg daily. Schedule soft tissue ultrasound of neck as discussed  Hypertension, Adult High blood pressure (hypertension) is when the force of blood pumping through the arteries is too strong. The arteries are the blood vessels that carry blood from the heart throughout the body. Hypertension forces the heart to work harder to pump blood and may cause arteries to become narrow or stiff. Untreated or uncontrolled hypertension can lead to a heart attack, heart failure, a stroke, kidney disease, and other problems. A blood pressure reading consists of a higher number over a lower number. Ideally, your blood pressure should be below 120/80. The first ("top") number is called the systolic pressure. It is a measure of the pressure in your arteries as your heart beats. The second ("bottom") number is called the diastolic pressure. It is a measure of the pressure in your arteries as the heart relaxes. What are the causes? The exact cause of this condition is not known. There are some conditions that result in high blood pressure. What increases the risk? Certain factors may make you more likely to develop high blood pressure. Some of these risk factors are under your control, including: Smoking. Not getting  enough exercise or physical activity. Being overweight. Having too much fat, sugar, calories, or salt (sodium) in your diet. Drinking too much alcohol. Other risk factors include: Having a personal history of heart disease, diabetes, high cholesterol, or kidney disease. Stress. Having a family history of high blood pressure and high  cholesterol. Having obstructive sleep apnea. Age. The risk increases with age. What are the signs or symptoms? High blood pressure may not cause symptoms. Very high blood pressure (hypertensive crisis) may cause: Headache. Fast or irregular heartbeats (palpitations). Shortness of breath. Nosebleed. Nausea and vomiting. Vision changes. Severe chest pain, dizziness, and seizures. How is this diagnosed? This condition is diagnosed by measuring your blood pressure while you are seated, with your arm resting on a flat surface, your legs uncrossed, and your feet flat on the floor. The cuff of the blood pressure monitor will be placed directly against the skin of your upper arm at the level of your heart. Blood pressure should be measured at least twice using the same arm. Certain conditions can cause a difference in blood pressure between your right and left arms. If you have a high blood pressure reading during one visit or you have normal blood pressure with other risk factors, you may be asked to: Return on a different day to have your blood pressure checked again. Monitor your blood pressure at home for 1 week or longer. If you are diagnosed with hypertension, you may have other blood or imaging tests to help your health care provider understand your overall risk for other conditions. How is this treated? This condition is treated by making healthy lifestyle changes, such as eating healthy foods, exercising more, and reducing your alcohol intake. You may be referred for counseling on a healthy diet and physical activity. Your health care provider may prescribe medicine if lifestyle changes are not enough to get your blood pressure under control and if: Your systolic blood pressure is above 130. Your diastolic blood pressure is above 80. Your personal target blood pressure may vary depending on your medical conditions, your age, and other factors. Follow these instructions at home: Eating and  drinking  Eat a diet that is high in fiber and potassium, and low in sodium, added sugar, and fat. An example of this eating plan is called the DASH diet. DASH stands for Dietary Approaches to Stop Hypertension. To eat this way: Eat plenty of fresh fruits and vegetables. Try to fill one half of your plate at each meal with fruits and vegetables. Eat whole grains, such as whole-wheat pasta, brown rice, or whole-grain bread. Fill about one fourth of your plate with whole grains. Eat or drink low-fat dairy products, such as skim milk or low-fat yogurt. Avoid fatty cuts of meat, processed or cured meats, and poultry with skin. Fill about one fourth of your plate with lean proteins, such as fish, chicken without skin, beans, eggs, or tofu. Avoid pre-made and processed foods. These tend to be higher in sodium, added sugar, and fat. Reduce your daily sodium intake. Many people with hypertension should eat less than 1,500 mg of sodium a day. Do not drink alcohol if: Your health care provider tells you not to drink. You are pregnant, may be pregnant, or are planning to become pregnant. If you drink alcohol: Limit how much you have to: 0-1 drink a day for women. 0-2 drinks a day for men. Know how much alcohol is in your drink. In the [place] one drink equals one 12 oz bottle  of beer (355 mL), one 5 oz glass of wine (148 mL), or one 1 oz glass of hard liquor (44 mL). Lifestyle  Work with your health care provider to maintain a healthy body weight or to lose weight. Ask what an ideal weight is for you. Get at least 30 minutes of exercise that causes your heart to beat faster (aerobic exercise) most days of the week. Activities may include walking, swimming, or biking. Include exercise to strengthen your muscles (resistance exercise), such as Pilates or lifting weights, as part of your weekly exercise routine. Try to do these types of exercises for 30 minutes at least 3 days a week. Do not use any products  that contain nicotine or tobacco. These products include cigarettes, chewing tobacco, and vaping devices, such as e-cigarettes. If you need help quitting, ask your health care provider. Monitor your blood pressure at home as told by your health care provider. Keep all follow-up visits. This is important. Medicines Take over-the-counter and prescription medicines only as told by your health care provider. Follow directions carefully. Blood pressure medicines must be taken as prescribed. Do not skip doses of blood pressure medicine. Doing this puts you at risk for problems and can make the medicine less effective. Ask your health care provider about side effects or reactions to medicines that you should watch for. Contact a health care provider if you: Think you are having a reaction to a medicine you are taking. Have headaches that keep coming back (recurring). Feel dizzy. Have swelling in your ankles. Have trouble with your vision. Get help right away if you: Develop a severe headache or confusion. Have unusual weakness or numbness. Feel faint. Have severe pain in your chest or abdomen. Vomit repeatedly. Have trouble breathing. These symptoms may be an emergency. Get help right away. Call 911. Do not wait to see if the symptoms will go away. Do not drive yourself to the hospital. Summary Hypertension is when the force of blood pumping through your arteries is too strong. If this condition is not controlled, it may put you at risk for serious complications. Your personal target blood pressure may vary depending on your medical conditions, your age, and other factors. For most people, a normal blood pressure is less than 120/80. Hypertension is treated with lifestyle changes, medicines, or a combination of both. Lifestyle changes include losing weight, eating a healthy, low-sodium diet, exercising more, and limiting alcohol. This information is not intended to replace advice given to you by  your health care provider. Make sure you discuss any questions you have with your health care provider. Document Revised: 04/06/2021 Document Reviewed: 04/06/2021 Elsevier Patient Education  2023 Elsevier Inc.    Edwina Barth, MD [facility]

## 2022-10-03 ENCOUNTER — Other Ambulatory Visit: Payer: Self-pay | Admitting: Podiatry

## 2022-10-03 DIAGNOSIS — E1165 Type 2 diabetes mellitus with hyperglycemia: Secondary | ICD-10-CM

## 2022-10-03 DIAGNOSIS — M2041 Other hammer toe(s) (acquired), right foot: Secondary | ICD-10-CM

## 2022-10-03 DIAGNOSIS — E1149 Type 2 diabetes mellitus with other diabetic neurological complication: Secondary | ICD-10-CM

## 2022-10-04 ENCOUNTER — Telehealth: Payer: Self-pay | Admitting: Gastroenterology

## 2022-10-04 NOTE — Telephone Encounter (Signed)
Patient called state she has been having some abdominal pain when she eats certain foods, she is asking for a GI cocktail script if possible.

## 2022-10-05 NOTE — Telephone Encounter (Signed)
I have spoken to the pt and she describes abd pain after eating certain foods.  She saw her PCP and they recommended she see GI.  She is a pt of Dr Russella Dar and prefers to see him however his next appt is not until July.  She did agree to an appt on 10/10/22 at 230 pm with [facility].  Appt made

## 2022-10-06 ENCOUNTER — Telehealth: Payer: Self-pay | Admitting: *Deleted

## 2022-10-06 NOTE — Progress Notes (Signed)
  Care Coordination Note  10/06/2022 Name: SHAYLYNNE LUNT MRN: [id number] DOB: 23-Feb-1961  CANAAN PRUE is a 62 y.o. year old female who is a primary care patient of Georgina Quint, MD and is actively engaged with the care management team. I reached out to [name] by phone today to assist with scheduling a follow up visit with the RN Case Manager  Follow up plan: Unsuccessful telephone outreach attempt made. A HIPAA compliant phone message was left for the patient providing contact information and requesting a return call.   Burman Nieves, CCMA Care Coordination Care Guide Direct Dial: [phone]

## 2022-10-07 DIAGNOSIS — G4733 Obstructive sleep apnea (adult) (pediatric): Secondary | ICD-10-CM | POA: Diagnosis not present

## 2022-10-10 ENCOUNTER — Ambulatory Visit: Payer: PPO | Admitting: Nurse Practitioner

## 2022-10-21 ENCOUNTER — Other Ambulatory Visit: Payer: Self-pay

## 2022-10-21 DIAGNOSIS — Z794 Long term (current) use of insulin: Secondary | ICD-10-CM | POA: Diagnosis not present

## 2022-10-21 DIAGNOSIS — M5432 Sciatica, left side: Secondary | ICD-10-CM | POA: Diagnosis not present

## 2022-10-21 DIAGNOSIS — M79605 Pain in left leg: Secondary | ICD-10-CM | POA: Insufficient documentation

## 2022-10-21 DIAGNOSIS — M25552 Pain in left hip: Secondary | ICD-10-CM | POA: Diagnosis not present

## 2022-10-21 DIAGNOSIS — G959 Disease of spinal cord, unspecified: Secondary | ICD-10-CM | POA: Diagnosis not present

## 2022-10-21 DIAGNOSIS — M5442 Lumbago with sciatica, left side: Secondary | ICD-10-CM | POA: Diagnosis not present

## 2022-10-21 MED ORDER — OMNIPOD DASH PODS (GEN 4) MISC: 3 refills | Status: DC

## 2022-10-22 ENCOUNTER — Other Ambulatory Visit: Payer: Self-pay

## 2022-10-22 ENCOUNTER — Emergency Department (HOSPITAL_COMMUNITY): Payer: PPO

## 2022-10-22 ENCOUNTER — Emergency Department (HOSPITAL_COMMUNITY)
Admission: EM | Admit: 2022-10-22 | Discharge: 2022-10-22 | Disposition: A | Discharge status: Home / Self Care | Payer: PPO | Attending: Emergency Medicine | Admitting: Emergency Medicine

## 2022-10-22 DIAGNOSIS — G959 Disease of spinal cord, unspecified: Secondary | ICD-10-CM | POA: Diagnosis not present

## 2022-10-22 DIAGNOSIS — M5432 Sciatica, left side: Secondary | ICD-10-CM

## 2022-10-22 LAB — CBC
HCT: 41 % (ref 36.0–46.0)
Hemoglobin: 12.7 g/dL (ref 12.0–15.0)
MCH: 26.1 pg (ref 26.0–34.0)
MCHC: 31 g/dL (ref 30.0–36.0)
MCV: 84.4 fL (ref 80.0–100.0)
Platelets: 233 10*3/uL (ref 150–400)
RBC: 4.86 MIL/uL (ref 3.87–5.11)
RDW: 14.7 % (ref 11.5–15.5)
WBC: 6.3 10*3/uL (ref 4.0–10.5)
nRBC: 0 % (ref 0.0–0.2)

## 2022-10-22 LAB — BASIC METABOLIC PANEL
Anion gap: 8 (ref 5–15)
BUN: 13 mg/dL (ref 8–23)
CO2: 27 mmol/L (ref 22–32)
Calcium: 8.9 mg/dL (ref 8.9–10.3)
Chloride: 105 mmol/L (ref 98–111)
Creatinine, Ser: 0.68 mg/dL (ref 0.44–1.00)
GFR, Estimated: >60 mL/min (ref >60)
Glucose, Bld: 154 mg/dL — ABNORMAL HIGH (ref 70–99)
Potassium: 3.7 mmol/L (ref 3.5–5.1)
Sodium: 140 mmol/L (ref 135–145)

## 2022-10-22 MED ORDER — HYDROMORPHONE HCL 1 MG/ML IJ SOLN
1.0000 mg | Freq: Once | INTRAMUSCULAR | Status: AC
  Administered 2022-10-22: 1 mg via INTRAVENOUS
  Filled 2022-10-22: qty 1

## 2022-10-22 MED ORDER — LIDOCAINE 5 % EX PTCH: 1.0000 | MEDICATED_PATCH | CUTANEOUS | 0 refills | Status: AC

## 2022-10-22 MED ORDER — HYDROMORPHONE HCL 1 MG/ML IJ SOLN
0.5000 mg | Freq: Once | INTRAMUSCULAR | Status: AC
  Administered 2022-10-22: 0.5 mg via INTRAVENOUS
  Filled 2022-10-22: qty 1

## 2022-10-22 MED ORDER — ONDANSETRON HCL 4 MG/2ML IJ SOLN
4.0000 mg | Freq: Once | INTRAMUSCULAR | Status: AC
  Administered 2022-10-22: 4 mg via INTRAVENOUS
  Filled 2022-10-22: qty 2

## 2022-10-22 MED ORDER — LIDOCAINE 5 % EX PTCH
1.0000 | MEDICATED_PATCH | CUTANEOUS | Status: DC
  Administered 2022-10-22: 1 via TRANSDERMAL
  Filled 2022-10-22: qty 1

## 2022-10-22 NOTE — ED Triage Notes (Signed)
Pt c/o L hip pain and leg pain since Tuesday. Denies injury/trauma. Has been taking tylenol and oxycodone at home without relief. Last doses around 2000 last night. Hx of lumbar spine issues. States that she is not able to bear weight without significant pain.

## 2022-10-22 NOTE — ED Provider Notes (Signed)
[place] EMERGENCY DEPARTMENT AT [facility] Provider Note   CSN: [id number] Arrival date & time: 10/21/22  2336     History  Chief Complaint  Patient presents with   Hip Pain   Leg Pain    [name] is a 62 y.o. female.  Patient presents to the emergency department for evaluation of pain in the left hip and leg.  Patient reports that symptoms began 3 days ago.  She denies injury.  Pain has progressively worsened over to 3 days.  She reports that she is having trouble walking now because when she puts weight on the leg the pain is more severe.  She has not noticed any weakness of the leg.  No change in bowel or bladder function.  She has been taking Tylenol and oxycodone at home but it has not helped the pain.  Patient reports that she has seen a spine doctor in the past and was told she might need surgery.       Home Medications Prior to Admission medications   Medication Sig Start Date End Date Taking? Authorizing Provider  acyclovir (ZOVIRAX) 400 MG tablet Take 1 tablet (400 mg total) by mouth 4 (four) times daily. 05/30/22   Georgina Quint, MD  albuterol (VENTOLIN HFA) 108 (90 Base) MCG/ACT inhaler Inhale 2 puffs into the lungs every 6 (six) hours as needed for wheezing or shortness of breath. 05/28/20   Jetty Duhamel D, MD  Blood Glucose Monitoring Suppl (ONETOUCH VERIO FLEX SYSTEM) w/Device KIT 1 each by Does not apply route 3 (three) times daily. 01/12/17   John Giovanni, MD  Buprenorphine HCl 750 MCG FILM Place 1 strip inside cheek 2 (two) times daily as needed (pain). 11/25/21   [provider]  Cholecalciferol (VITAMIN D3 PO) Take 1 tablet by mouth daily.    [provider]  Continuous Blood Gluc Sensor (FREESTYLE LIBRE 2 SENSOR) MISC Change sensor every 14 days 11/16/21   Shamleffer, Konrad Dolores, MD  Continuous Glucose Monitor DEVI Use as directed. 03/28/17   John Giovanni, MD  Cyanocobalamin (B-12 PO) Take 1 tablet by  mouth daily.    [provider]  dapagliflozin propanediol (FARXIGA) 10 MG TABS tablet Take 1 tablet (10 mg total) by mouth daily. 09/06/22   Shamleffer, Konrad Dolores, MD  diclofenac Sodium (VOLTAREN) 1 % GEL Apply 2 g topically 4 (four) times daily. Rub into affected area of foot 2 to 4 times daily Patient taking differently: Apply 2 g topically 4 (four) times daily as needed (pain). 01/14/22   Vivi Barrack, DPM  dicyclomine (BENTYL) 10 MG capsule TAKE 1 CAPSULE BY MOUTH THREE TIMES DAILY BEFORE MEAL(S) 02/04/22   Esterwood, Amy S, PA-C  esomeprazole (NEXIUM) 40 MG capsule Take 1 capsule (40 mg total) by mouth 2 (two) times daily before a meal. Take 30 minutes before breakfast and dinner. 02/04/22   Esterwood, Amy S, PA-C  ezetimibe (ZETIA) 10 MG tablet Take 1 tablet (10 mg total) by mouth daily. 09/06/22 09/06/23  Shamleffer, Konrad Dolores, MD  famotidine (PEPCID) 20 MG tablet Take 1 tablet (20 mg total) by mouth 2 (two) times daily as needed. 02/04/22   Esterwood, Amy S, PA-C  glucose blood (FREESTYLE TEST STRIPS) test strip 1 each by Other route 3 (three) times daily. 09/06/21   Shamleffer, Konrad Dolores, MD  Insulin Disposable Pump (OMNIPOD DASH INTRO, GEN 4,) KIT 1 Device by Does not apply route every 3 (three) days. 01/24/22   Shamleffer,  Konrad Dolores, MD  Insulin Disposable Pump (OMNIPOD DASH PODS, GEN 4,) MISC USE ONE POD EVERY ONE AND ONE-HALF DAYS. 10/21/22   Shamleffer, Konrad Dolores, MD  Insulin Syringe-Needle U-100 (INSULIN SYRINGE 1CC/30GX1/2") 30G X 1/2" 1 ML MISC Use to fill Vgo daily 11/06/18   Dorrell, Cathleen Corti, MD  letrozole [employer]) 2.5 MG tablet Take 1 tablet by mouth once daily 06/20/22   Serena Croissant, MD  metFORMIN (GLUCOPHAGE) 1000 MG tablet Take 1 tablet (1,000 mg total) by mouth 2 (two) times daily with a meal. 09/06/22   Shamleffer, Konrad Dolores, MD  Misc Natural Products (ELDERBERRY IMMUNE COMPLEX) CHEW Chew 1 tablet by mouth daily.    [provider]  montelukast (SINGULAIR) 10 MG tablet Take 1 tablet (10 mg total) by mouth at bedtime. 12/03/21   Glenford Bayley, NP  Multiple Vitamin (MULTIVITAMIN WITH MINERALS) TABS tablet Take 1 tablet by mouth daily.    [provider]  NARCAN 4 MG/0.1ML LIQD nasal spray kit Place 0.4 mg into the nose once. 12/06/18   [provider]  NOVOLOG 100 UNIT/ML injection MAX DAILY 120 UNITS  DX E11.65 05/25/22   Shamleffer, Konrad Dolores, MD  [facility] DELICA LANCETS FINE MISC Check blood sugar 3 times a day 06/22/16   John Giovanni, MD  oxyCODONE-acetaminophen (PERCOCET) 10-325 MG tablet Take 1 tablet by mouth every 8 (eight) hours as needed for pain. 03/02/20   [provider]  pregabalin (LYRICA) 75 MG capsule Take 75 mg by mouth 2 (two) times daily.    [provider]  rosuvastatin (CRESTOR) 10 MG tablet Take 1 tablet (10 mg total) by mouth daily. 09/06/22   Shamleffer, Konrad Dolores, MD  Semaglutide, 2 MG/DOSE, (OZEMPIC, 2 MG/DOSE,) 8 MG/3ML SOPN Inject 2 mg into the skin once a week. 09/06/22   Shamleffer, Konrad Dolores, MD  SYMBICORT 160-4.5 MCG/ACT inhaler Inhale 2 puffs by mouth twice daily 07/29/22   Jetty Duhamel D, MD  tiZANidine (ZANAFLEX) 4 MG capsule Take 4 mg by mouth 3 (three) times daily as needed for muscle spasms. 07/23/21   [provider]  valACYclovir (VALTREX) 500 MG tablet TAKE 1 TABLET BY MOUTH TWICE DAILY FOR 5 DAYS AT  TIME  OF  OUTBREAK 05/30/22   Georgina Quint, MD  valsartan (DIOVAN) 80 MG tablet Take 1 tablet (80 mg total) by mouth daily. 09/28/22   Georgina Quint, MD      Allergies    Breo ellipta [fluticasone furoate-vilanterol], Hydrochlorothiazide, and Lisinopril    Review of Systems   Review of Systems  Physical Exam Updated Vital Signs BP (!) 121/56 (BP Location: Left Arm)   Pulse 80   Temp 98 F (36.7 C) (Oral)   Resp 14   Ht 5\' 4"  (1.626 m)   Wt 112 kg   SpO2 94%   BMI 42.40 kg/m  Physical  Exam Vitals and nursing note reviewed.  Constitutional:      General: She is not in acute distress.    Appearance: She is well-developed.  HENT:     Head: Normocephalic and atraumatic.     Mouth/Throat:     Mouth: Mucous membranes are moist.  Eyes:     General: Vision grossly intact. Gaze aligned appropriately.     Extraocular Movements: Extraocular movements intact.     Conjunctiva/sclera: Conjunctivae normal.  Cardiovascular:     Rate and Rhythm: Normal rate and regular rhythm.     Pulses: Normal pulses.     Heart  sounds: Normal heart sounds, S1 normal and S2 normal. No murmur heard.    No friction rub. No gallop.  Pulmonary:     Effort: Pulmonary effort is normal. No respiratory distress.     Breath sounds: Normal breath sounds.  Abdominal:     General: Bowel sounds are normal.     Palpations: Abdomen is soft.     Tenderness: There is no abdominal tenderness. There is no guarding or rebound.     Hernia: No hernia is present.  Musculoskeletal:        General: No swelling.     Cervical back: Full passive range of motion without pain, normal range of motion and neck supple. No spinous process tenderness or muscular tenderness. Normal range of motion.     Lumbar back: Positive left straight leg raise test.     Right lower leg: No edema.     Left lower leg: No edema.  Skin:    General: Skin is warm and dry.     Capillary Refill: Capillary refill takes less than 2 seconds.     Findings: No ecchymosis, erythema, rash or wound.  Neurological:     General: No focal deficit present.     Mental Status: She is alert and oriented to person, place, and time.     GCS: GCS eye subscore is 4. GCS verbal subscore is 5. GCS motor subscore is 6.     Cranial Nerves: Cranial nerves 2-12 are intact.     Sensory: Sensation is intact.     Motor: Motor function is intact.     Coordination: Coordination is intact.  Psychiatric:        Attention and Perception: Attention normal.        Mood and  Affect: Mood normal.        Speech: Speech normal.        Behavior: Behavior normal.     ED Results / Procedures / Treatments   Labs (all labs ordered are listed, but only abnormal results are displayed) Labs Reviewed  BASIC METABOLIC PANEL - Abnormal; Notable for the following components:      Result Value   Glucose, Bld 154 (*)    All other components within normal limits  CBC    EKG None  Radiology No results found.  Procedures Procedures    Medications Ordered in ED Medications  HYDROmorphone (DILAUDID) injection 1 mg (1 mg Intravenous Given 10/22/22 0223)  ondansetron (ZOFRAN) injection 4 mg (4 mg Intravenous Given 10/22/22 0223)  HYDROmorphone (DILAUDID) injection 0.5 mg (0.5 mg Intravenous Given 10/22/22 1610)    ED Course/ Medical Decision Making/ A&P                             Medical Decision Making Amount and/or Complexity of Data Reviewed Labs: ordered.  Risk Prescription drug management.   Differential Diagnosis considered includes, but not limited to: Muscle strain; acute disc herniation; lumbar fracture; cauda equina syndrome; infection; malignancy   Patient presents with left-sided radicular low back pain.  Patient has had problems with her back for some time and was told she would need surgery.  For the last few days she has been having increasing pain in the posterior left hip area that radiates down the leg.  No associated weakness.  She has normal sensation, no foot drop.  No saddle anesthesia.  She has not had any bowel or bladder changes, no red flags for cauda  equina syndrome or other neurosurgical emergency.  No risk factors for infection, afebrile.  Patient had been given oxycodone by her doctor, was taking it at home but did not get pain relief.  As her examination is not consistent with a neurosurgical emergency, patient was provided analgesia here in the ED and will need to follow-up with primary care.  She may need outpatient MRI and  follow-up once again with spine surgery.  She does not need to be seen by neurosurgery today based on her exam.        Final Clinical Impression(s) / ED Diagnoses Final diagnoses:  Sciatica of left side    Rx / DC Orders ED Discharge Orders     None         Latunya Kissick, Canary Brim, MD 10/22/22 0630

## 2022-10-22 NOTE — Discharge Instructions (Addendum)
Return for any problem.  ?

## 2022-10-22 NOTE — ED Provider Notes (Signed)
Patient seen after prior EDP.  Patient is feeling improved.  MRI results discussed with patient and husband.  Patient understands need for close outpatient follow-up.  Strict return precautions given and understood.       Wynetta Fines, MD 10/22/22 [phone]

## 2022-10-25 ENCOUNTER — Other Ambulatory Visit: Payer: PPO

## 2022-10-27 ENCOUNTER — Ambulatory Visit (INDEPENDENT_AMBULATORY_CARE_PROVIDER_SITE_OTHER): Payer: PPO | Admitting: Emergency Medicine

## 2022-10-27 ENCOUNTER — Encounter: Payer: Self-pay | Admitting: Emergency Medicine

## 2022-10-27 VITALS — BP 130/78 | HR 75 | Temp 98.4°F | Ht 64.0 in | Wt 242.0 lb

## 2022-10-27 DIAGNOSIS — M4726 Other spondylosis with radiculopathy, lumbar region: Secondary | ICD-10-CM

## 2022-10-27 DIAGNOSIS — M5442 Lumbago with sciatica, left side: Secondary | ICD-10-CM

## 2022-10-27 DIAGNOSIS — M5441 Lumbago with sciatica, right side: Secondary | ICD-10-CM

## 2022-10-27 DIAGNOSIS — G8929 Other chronic pain: Secondary | ICD-10-CM

## 2022-10-27 DIAGNOSIS — M48061 Spinal stenosis, lumbar region without neurogenic claudication: Secondary | ICD-10-CM | POA: Diagnosis not present

## 2022-10-27 DIAGNOSIS — M5136 Other intervertebral disc degeneration, lumbar region: Secondary | ICD-10-CM

## 2022-10-27 DIAGNOSIS — M5416 Radiculopathy, lumbar region: Secondary | ICD-10-CM | POA: Diagnosis not present

## 2022-10-27 DIAGNOSIS — M51369 Other intervertebral disc degeneration, lumbar region without mention of lumbar back pain or lower extremity pain: Secondary | ICD-10-CM | POA: Insufficient documentation

## 2022-10-27 NOTE — Progress Notes (Signed)
[name] 62 y.o.   Chief Complaint  Patient presents with   Medical Management of Chronic Issues    ER follow up     HISTORY OF PRESENT ILLNESS: [name] here for follow-up of emergency department visit on 10/22/2022 when she presented with sciatica symptoms MRI of lumbar spine showed significant disease.  Report reviewed with patient Presently on oxycodone and chronic pain management BP Readings from Last 3 Encounters:  10/27/22 130/78  10/22/22 138/60  09/28/22 138/88  Most recent urgent care visit assessment and plan as follows: Differential Diagnosis considered includes, but not limited to: Muscle strain; acute disc herniation; lumbar fracture; cauda equina syndrome; infection; malignancy    Patient presents with left-sided radicular low back pain.  Patient has had problems with her back for some time and was told she would need surgery.  For the last few days she has been having increasing pain in the posterior left hip area that radiates down the leg.  No associated weakness.  She has normal sensation, no foot drop.  No saddle anesthesia.  She has not had any bowel or bladder changes, no red flags for cauda equina syndrome or other neurosurgical emergency.  No risk factors for infection, afebrile.   Patient had been given oxycodone by her doctor, was taking it at home but did not get pain relief.  As her examination is not consistent with a neurosurgical emergency, patient was provided analgesia here in the ED and will need to follow-up with primary care.  She may need outpatient MRI and follow-up once again with spine surgery.  She does not need to be seen by neurosurgery today based on her exam.     HPI   Prior to Admission medications   Medication Sig Start Date End Date Taking? Authorizing Provider  acyclovir (ZOVIRAX) 400 MG tablet Take 1 tablet (400 mg total) by mouth 4 (four) times daily. 05/30/22  Yes Creedence Heiss, Eilleen Kempf, MD  albuterol (VENTOLIN  HFA) 108 (90 Base) MCG/ACT inhaler Inhale 2 puffs into the lungs every 6 (six) hours as needed for wheezing or shortness of breath. 05/28/20  Yes Young, Joni Fears D, MD  Blood Glucose Monitoring Suppl (ONETOUCH VERIO FLEX SYSTEM) w/Device KIT 1 each by Does not apply route 3 (three) times daily. 01/12/17  Yes John Giovanni, MD  Buprenorphine HCl 750 MCG FILM Place 1 strip inside cheek 2 (two) times daily as needed (pain). 11/25/21  Yes [provider]  Cholecalciferol (VITAMIN D3 PO) Take 1 tablet by mouth daily.   Yes [provider]  Continuous Blood Gluc Sensor (FREESTYLE LIBRE 2 SENSOR) MISC Change sensor every 14 days 11/16/21  Yes Shamleffer, Konrad Dolores, MD  Continuous Glucose Monitor DEVI Use as directed. 03/28/17  Yes John Giovanni, MD  Cyanocobalamin (B-12 PO) Take 1 tablet by mouth daily.   Yes [provider]  dapagliflozin propanediol (FARXIGA) 10 MG TABS tablet Take 1 tablet (10 mg total) by mouth daily. 09/06/22  Yes Shamleffer, Konrad Dolores, MD  diclofenac Sodium (VOLTAREN) 1 % GEL Apply 2 g topically 4 (four) times daily. Rub into affected area of foot 2 to 4 times daily Patient taking differently: Apply 2 g topically 4 (four) times daily as needed (pain). 01/14/22  Yes Vivi Barrack, DPM  dicyclomine (BENTYL) 10 MG capsule TAKE 1 CAPSULE BY MOUTH THREE TIMES DAILY BEFORE MEAL(S) 02/04/22  Yes Esterwood, Amy S, PA-C  esomeprazole (NEXIUM) 40 MG capsule Take 1 capsule (40 mg  total) by mouth 2 (two) times daily before a meal. Take 30 minutes before breakfast and dinner. 02/04/22  Yes Esterwood, Amy S, PA-C  ezetimibe (ZETIA) 10 MG tablet Take 1 tablet (10 mg total) by mouth daily. 09/06/22 09/06/23 Yes Shamleffer, Konrad Dolores, MD  famotidine (PEPCID) 20 MG tablet Take 1 tablet (20 mg total) by mouth 2 (two) times daily as needed. 02/04/22  Yes Esterwood, Amy S, PA-C  glucose blood (FREESTYLE TEST STRIPS) test strip 1 each by Other route 3 (three)  times daily. 09/06/21  Yes Shamleffer, Konrad Dolores, MD  Insulin Disposable Pump (OMNIPOD DASH INTRO, GEN 4,) KIT 1 Device by Does not apply route every 3 (three) days. 01/24/22  Yes Shamleffer, Konrad Dolores, MD  Insulin Disposable Pump (OMNIPOD DASH PODS, GEN 4,) MISC USE ONE POD EVERY ONE AND ONE-HALF DAYS. 10/21/22  Yes Shamleffer, Konrad Dolores, MD  Insulin Syringe-Needle U-100 (INSULIN SYRINGE 1CC/30GX1/2") 30G X 1/2" 1 ML MISC Use to fill Vgo daily 11/06/18  Yes Dorrell, Cathleen Corti, MD  letrozole [facility]) 2.5 MG tablet Take 1 tablet by mouth once daily 06/20/22  Yes Serena Croissant, MD  lidocaine (LIDODERM) 5 % Place 1 patch onto the skin daily. Remove & Discard patch within 12 hours or as directed by MD 10/22/22  Yes Wynetta Fines, MD  metFORMIN (GLUCOPHAGE) 1000 MG tablet Take 1 tablet (1,000 mg total) by mouth 2 (two) times daily with a meal. 09/06/22  Yes Shamleffer, Konrad Dolores, MD  Misc Natural Products (ELDERBERRY IMMUNE COMPLEX) CHEW Chew 1 tablet by mouth daily.   Yes [provider]  montelukast (SINGULAIR) 10 MG tablet Take 1 tablet (10 mg total) by mouth at bedtime. 12/03/21  Yes Glenford Bayley, NP  Multiple Vitamin (MULTIVITAMIN WITH MINERALS) TABS tablet Take 1 tablet by mouth daily.   Yes [provider]  NARCAN 4 MG/0.1ML LIQD nasal spray kit Place 0.4 mg into the nose once. 12/06/18  Yes [provider]  NOVOLOG 100 UNIT/ML injection MAX DAILY 120 UNITS  DX E11.65 05/25/22  Yes Shamleffer, Konrad Dolores, MD  [facility] DELICA LANCETS FINE MISC Check blood sugar 3 times a day 06/22/16  Yes John Giovanni, MD  oxyCODONE-acetaminophen (PERCOCET) 10-325 MG tablet Take 1 tablet by mouth every 8 (eight) hours as needed for pain. 03/02/20  Yes [provider]  pregabalin (LYRICA) 75 MG capsule Take 75 mg by mouth 2 (two) times daily.   Yes [provider]  rosuvastatin (CRESTOR) 10 MG tablet Take 1 tablet (10 mg total) by mouth  daily. 09/06/22  Yes Shamleffer, Konrad Dolores, MD  Semaglutide, 2 MG/DOSE, (OZEMPIC, 2 MG/DOSE,) 8 MG/3ML SOPN Inject 2 mg into the skin once a week. 09/06/22  Yes Shamleffer, Konrad Dolores, MD  SYMBICORT 160-4.5 MCG/ACT inhaler Inhale 2 puffs by mouth twice daily 07/29/22  Yes Young, Clinton D, MD  tiZANidine (ZANAFLEX) 4 MG capsule Take 4 mg by mouth 3 (three) times daily as needed for muscle spasms. 07/23/21  Yes [provider]  valACYclovir (VALTREX) 500 MG tablet TAKE 1 TABLET BY MOUTH TWICE DAILY FOR 5 DAYS AT  TIME  OF  OUTBREAK 05/30/22  Yes Jemeka Wagler, Eilleen Kempf, MD  valsartan (DIOVAN) 80 MG tablet Take 1 tablet (80 mg total) by mouth daily. 09/28/22  Yes Minerva Bluett, Eilleen Kempf, MD    Allergies  Allergen Reactions   Earlie Server [Fluticasone Furoate-Vilanterol] Other (See Comments)    Urinary retention   Hydrochlorothiazide     Hypokalemia  Lisinopril Cough    Patient Active Problem List   Diagnosis Date Noted   Neck pain on left side 09/28/2022   Soft tissue mass 09/28/2022   Personal history of colonic polyps 05/19/2022   Benign neoplasm of ascending colon 05/19/2022   Benign neoplasm of transverse colon 05/19/2022   Malignant neoplasm of upper-outer quadrant of right breast in female, estrogen receptor positive (HCC) 02/22/2021   Body mass index (BMI) 40.0-44.9, adult (HCC) 07/28/2020   Lumbago with sciatica, right side 07/28/2020   OSA on CPAP 10/31/2018   Stress incontinence in female 03/09/2017   Hepatic steatosis 08/29/2016   Herpes simplex 03/08/2016   Diabetic neuropathy with neurologic complication (HCC) 10/20/2015   Long term current use of opiate analgesic 07/21/2015   COPD GOLD II  05/31/2013   Lung nodule < 6cm on CT 05/31/2013   Chronic cough 12/07/2012   Chronic pain syndrome 02/13/2012   Lumbar radiculopathy 11/29/2011   Type 2 diabetes mellitus with diabetic neuropathy (HCC) 08/25/2011   Back pain 11/15/2010   Hypertension associated with  diabetes (HCC) 07/07/2009   GERD 01/08/2009   Dyslipidemia associated with type 2 diabetes mellitus (HCC) 12/09/2008   Hyperlipidemia 12/09/2008   Morbid obesity due to excess calories (HCC) 12/09/2008    Past Medical History:  Diagnosis Date   Allergy    Arthritis    back    Asthma    AS CHILD   Cancer (HCC)    breast CA- Right,   Chronic back pain    Chronic leg pain    due to back pain   Cocaine abuse (HCC)    in remission   COPD (chronic obstructive pulmonary disease) (HCC)    Diabetes (HCC)    with neuropathy   GERD (gastroesophageal reflux disease)    Hyperlipidemia    Hypertension    Internal hemorrhoids    Intraductal papilloma of left breast 02/18/2016   Neuromuscular disorder (HCC)    neuropathy   Personal history of radiation therapy    Postlaminectomy syndrome of lumbar region 12/07/2011   RECTAL BLEEDING 12/09/2008   Annotation: s/p EGD 7/08 mild gastritis, s/p colonoscopy 7/08- benign polyp  s/p polypectomy and isolated diverticulum.  Qualifier: Diagnosis of  By: Ditzler RN, Debra     Sciatica    per patient    Sleep apnea    CPAP    YEARS AGO DONE 1/2 YEARS AGO AND WAS TOLD DID NOT HAVE   Tobacco abuse    Tubular adenoma of colon    Uterine fibroid    s/p hysterectomy    Past Surgical History:  Procedure Laterality Date   BACK SURGERY  2012   L5-S1 microendoscopic disectomy last surgery 06/2011   BREAST BIOPSY     LEFT    01/19/16   BREAST BIOPSY Right 02/16/2021   BREAST EXCISIONAL BIOPSY Left 02/2016   BREAST LUMPECTOMY Right 03/12/2021   BREAST LUMPECTOMY WITH RADIOACTIVE SEED AND SENTINEL LYMPH NODE BIOPSY Right 03/12/2021   Procedure: RIGHT BREAST LUMPECTOMY WITH RADIOACTIVE SEED AND SENTINEL LYMPH NODE BIOPSY;  Surgeon: Griselda Miner, MD;  Location: [facility] OR;  Service: General;  Laterality: Right;   BREAST REDUCTION SURGERY  1982   CATARACT EXTRACTION Right 11/07/2019   COLONOSCOPY     COLONOSCOPY WITH PROPOFOL N/A 05/19/2022   Procedure:  COLONOSCOPY WITH PROPOFOL;  Surgeon: Meryl Dare, MD;  Location: [facility];  Service: Gastroenterology;  Laterality: N/A;   HAND SURGERY     MIDDLE TRIGGER FINGER  RIGHT SIDE   POLYPECTOMY  05/19/2022   Procedure: POLYPECTOMY;  Surgeon: Meryl Dare, MD;  Location: [facility];  Service: Gastroenterology;;   RADIOACTIVE SEED GUIDED EXCISIONAL BREAST BIOPSY Left 02/18/2016   Procedure: LEFT RADIOACTIVE SEED GUIDED EXCISIONAL BREAST BIOPSY;  Surgeon: Ovidio Kin, MD;  Location: [facility];  Service: General;  Laterality: Left;   REDUCTION MAMMAPLASTY Bilateral    TONSILLECTOMY  2008   TOTAL ABDOMINAL HYSTERECTOMY  05/24/2007   hysterectomy   TUBAL LIGATION     UPPER GASTROINTESTINAL ENDOSCOPY      Social History   Socioeconomic History   Marital status: Married    Spouse name: Not on file   Number of children: 3   Years of education: Not on file   Highest education level: Not on file  Occupational History    Comment: [employer]  Tobacco Use   Smoking status: Former    Packs/day: 0.50    Years: 20.00    Additional pack years: 0.00    Total pack years: 10.00    Types: Cigarettes    Quit date: 07/23/1996    Years since quitting: 26.2   Smokeless tobacco: Never  Vaping Use   Vaping Use: Never used  Substance and Sexual Activity   Alcohol use: Not Currently    Comment: recovering addict clean for 9 years   Drug use: Not Currently    Types: Cocaine, Marijuana    Comment: recovering addict clean for 9 years   Sexual activity: Yes    Birth control/protection: Surgical  Other Topics Concern   Not on file  Social History Narrative   Current Social History 01/17/2020        Patient lives with spouse in a home which is 1 story. There are not steps up to the entrance the patient uses.       Patient's method of transportation is personal car.      The highest level of education was some college.      The patient currently works part-time.      Identified  important Relationships are God, husband, kids, grandkids       Pets : None       Interests / Fun: Drawing and nature       Current Stressors: Pain, Covid       Religious / Personal Beliefs: Christian       Other: None    Social Determinants of Health   Financial Resource Strain: Low Risk  (02/18/2022)   Overall Financial Resource Strain (CARDIA)    Difficulty of Paying Living Expenses: Not hard at all  Food Insecurity: No Food Insecurity (02/18/2022)   Hunger Vital Sign    Worried About Running Out of Food in the Last Year: Never true    Ran Out of Food in the Last Year: Never true  Transportation Needs: No Transportation Needs (02/18/2022)   PRAPARE - Administrator, Civil Service (Medical): No    Lack of Transportation (Non-Medical): No  Physical Activity: Insufficiently Active (02/18/2022)   Exercise Vital Sign    Days of Exercise per Week: 2 days    Minutes of Exercise per Session: 30 min  Stress: No Stress Concern Present (02/18/2022)   [employer] of Occupational Health - Occupational Stress Questionnaire    Feeling of Stress : Not at all  Social Connections: Moderately Integrated (02/18/2022)   Social Connection and Isolation Panel [NHANES]    Frequency of Communication with Friends and Family: More  than three times a week    Frequency of Social Gatherings with Friends and Family: More than three times a week    Attends Religious Services: More than 4 times per year    Active Member of [employer] or Organizations: No    Attends Banker Meetings: Never    Marital Status: Married  Catering manager Violence: Not At Risk (02/18/2022)   Humiliation, Afraid, Rape, and Kick questionnaire    Fear of Current or Ex-Partner: No    Emotionally Abused: No    Physically Abused: No    Sexually Abused: No    Family History  Problem Relation Age of Onset   Diabetes Father    Heart disease Father    Hypertension Father    Diabetes Mother    Cancer Mother         brain   Hypertension Mother    Kidney disease Sister    Diabetes Sister    Kidney cancer Sister    Stroke Sister    Diabetes Sister    Hypertension Sister    Diabetes Sister    Hypertension Sister    Obesity Son    Colon cancer Neg Hx    Colon polyps Neg Hx    Esophageal cancer Neg Hx    Rectal cancer Neg Hx    Stomach cancer Neg Hx      Review of Systems  Constitutional: Negative.  Negative for chills and fever.  HENT: Negative.  Negative for congestion and sore throat.   Respiratory: Negative.  Negative for cough and shortness of breath.   Cardiovascular: Negative.  Negative for chest pain and palpitations.  Gastrointestinal:  Negative for abdominal pain, nausea and vomiting.  Genitourinary: Negative.  Negative for dysuria and hematuria.  Musculoskeletal:  Positive for back pain.  Skin: Negative.  Negative for rash.  Neurological: Negative.  Negative for dizziness.  All other systems reviewed and are negative.   Vitals:   10/27/22 0929  BP: 130/78  Pulse: 75  Temp: 98.4 F (36.9 C)  SpO2: 99%    Physical Exam Vitals reviewed.  Constitutional:      Appearance: She is obese.  Eyes:     Extraocular Movements: Extraocular movements intact.  Cardiovascular:     Rate and Rhythm: Normal rate.  Pulmonary:     Effort: Pulmonary effort is normal.  Skin:    General: Skin is warm and dry.  Neurological:     Mental Status: She is alert and oriented to person, place, and time.  Psychiatric:        Mood and Affect: Mood normal.        Behavior: Behavior normal.      ASSESSMENT & PLAN: A total of 44 minutes was spent with the patient and counseling/coordination of care regarding preparing for this visit, review of most recent office visit notes, review of most recent urgent care visit notes, review of chronic medical conditions under management, review of all medications, review of most recent lumbar spine MRI report, chronic pain management, need for spine surgery  referral, prognosis, documentation and need for follow-up   Problem List Items Addressed This Visit       Nervous and Auditory   Lumbar radiculopathy    Currently active and affecting quality of life Chronic pain management discussed Presently on oxycodone.  Under pain management clinic care      Osteoarthritis of spine with radiculopathy, lumbar region - Primary    Recent lumbar spine MRI report reviewed  with patient Lots of lumbar pathology contributing to chronic lumbar pain Pain management discussed Need to evaluation by spine surgeon Referral placed today      Relevant Orders   Ambulatory referral to Spine Surgery     Musculoskeletal and Integument   Lumbar degenerative disc disease    Contributing greatly to chronic lumbar pain Needs spinal surgery evaluation Referral placed today.      Relevant Orders   Ambulatory referral to Spine Surgery     Other   Back pain (Chronic)   Relevant Orders   Ambulatory referral to Spine Surgery   Spinal stenosis of lumbar region without neurogenic claudication    Contributing to chronic back pain Recent lumbar spine MRI report reviewed with patient Recommend evaluation by spine surgeon Referral placed today      Relevant Orders   Ambulatory referral to Spine Surgery   Patient Instructions  Chronic Back Pain Chronic back pain is back pain that lasts longer than 3 months. The pain may get worse at certain times (flare-ups). There are things you can do at home to manage your pain. Follow these instructions at home: Watch for any changes in your symptoms. Take these actions to help with your pain: Managing pain and stiffness     If told, put ice on the painful area. You may be told to use ice for 24-48 hours after a flare-up starts. Put ice in a plastic bag. Place a towel between your skin and the bag. Leave the ice on for 20 minutes, 2-3 times a day. If told, put heat on the painful area. Do this as often as told by  your doctor. Use the heat source that your doctor recommends, such as a moist heat pack or a heating pad. Place a towel between your skin and the heat source. Leave the heat on for 20-30 minutes. If your skin turns bright red, take off the ice or heat right away to prevent skin damage. The risk of damage is higher if you cannot feel pain, heat, or cold. Soak in a warm bath. This can help with pain. Activity        Avoid bending and other activities that make the pain worse. When you stand: Keep your upper back and neck straight. Keep your shoulders pulled back. Avoid slouching. When you sit: Keep your back straight. Relax your shoulders. Do not round your shoulders or pull them backward. Do not sit or stand in one place for too long. Take short rest breaks during the day. Lying down or standing is often better than sitting. Resting can help relieve pain. When sitting or lying down for a long time, do some mild activity or stretching. This will help to prevent stiffness and pain. Get regular exercise. Ask your doctor what activities are safe for you. You may have to avoid lifting. Ask your provider how much you can safely lift. If you lift things: Bend your knees. Keep the weight close to your body. Avoid twisting. Medicines Take over-the-counter and prescription medicines only as told by your doctor. You may need to take medicines for pain and swelling. These may be taken by mouth or put on the skin. You may also be given muscle relaxants. Ask your doctor if the medicine prescribed to you: Requires you to avoid driving or using machinery. Can cause trouble pooping (constipation). You may need to take these actions to prevent or treat trouble pooping: Drink enough fluid to keep your pee (urine) pale yellow. Take over-the-counter  or prescription medicines. Eat foods that are high in fiber. These include beans, whole grains, and fresh fruits and vegetables. Limit foods that are high  in fat and sugars. These include fried or sweet foods. General instructions  Sleep on a firm mattress. Try lying on your side with your knees slightly bent. If you lie on your back, put a pillow under your knees. Do not smoke or use any products that contain nicotine or tobacco. If you need help quitting, ask your doctor. Contact a doctor if: Your pain does not get better with rest or medicine. You have new pain. You have a fever. You lose weight quickly. You have trouble doing your normal activities. One or both of your legs or feet feel weak. One or both of your legs or feet lose feeling (have numbness). Get help right away if: You are not able to control when you pee or poop. You have bad back pain and: You feel like you may vomit (nauseous). You vomit. You have pain in your chest or your belly (abdomen). You have shortness of breath. You faint. These symptoms may be an emergency. Get help right away. Call 911. Do not wait to see if the symptoms will go away. Do not drive yourself to the hospital. This information is not intended to replace advice given to you by your health care provider. Make sure you discuss any questions you have with your health care provider. Document Revised: 01/17/2022 Document Reviewed: 01/17/2022 Elsevier Patient Education  2023 Elsevier Inc.     Edwina Barth, MD [facility]

## 2022-10-27 NOTE — Assessment & Plan Note (Signed)
Contributing greatly to chronic lumbar pain Needs spinal surgery evaluation Referral placed today.

## 2022-10-27 NOTE — Assessment & Plan Note (Signed)
Currently active and affecting quality of life Chronic pain management discussed Presently on oxycodone.  Under pain management clinic care

## 2022-10-27 NOTE — Assessment & Plan Note (Signed)
Recent lumbar spine MRI report reviewed with patient Lots of lumbar pathology contributing to chronic lumbar pain Pain management discussed Need to evaluation by spine surgeon Referral placed today

## 2022-10-27 NOTE — Assessment & Plan Note (Signed)
Contributing to chronic back pain Recent lumbar spine MRI report reviewed with patient Recommend evaluation by spine surgeon Referral placed today

## 2022-10-27 NOTE — Patient Instructions (Signed)
Chronic Back Pain Chronic back pain is back pain that lasts longer than 3 months. The pain may get worse at certain times (flare-ups). There are things you can do at home to manage your pain. Follow these instructions at home: Watch for any changes in your symptoms. Take these actions to help with your pain: Managing pain and stiffness     If told, put ice on the painful area. You may be told to use ice for 24-48 hours after a flare-up starts. Put ice in a plastic bag. Place a towel between your skin and the bag. Leave the ice on for 20 minutes, 2-3 times a day. If told, put heat on the painful area. Do this as often as told by your doctor. Use the heat source that your doctor recommends, such as a moist heat pack or a heating pad. Place a towel between your skin and the heat source. Leave the heat on for 20-30 minutes. If your skin turns bright red, take off the ice or heat right away to prevent skin damage. The risk of damage is higher if you cannot feel pain, heat, or cold. Soak in a warm bath. This can help with pain. Activity        Avoid bending and other activities that make the pain worse. When you stand: Keep your upper back and neck straight. Keep your shoulders pulled back. Avoid slouching. When you sit: Keep your back straight. Relax your shoulders. Do not round your shoulders or pull them backward. Do not sit or stand in one place for too long. Take short rest breaks during the day. Lying down or standing is often better than sitting. Resting can help relieve pain. When sitting or lying down for a long time, do some mild activity or stretching. This will help to prevent stiffness and pain. Get regular exercise. Ask your doctor what activities are safe for you. You may have to avoid lifting. Ask your provider how much you can safely lift. If you lift things: Bend your knees. Keep the weight close to your body. Avoid twisting. Medicines Take over-the-counter and  prescription medicines only as told by your doctor. You may need to take medicines for pain and swelling. These may be taken by mouth or put on the skin. You may also be given muscle relaxants. Ask your doctor if the medicine prescribed to you: Requires you to avoid driving or using machinery. Can cause trouble pooping (constipation). You may need to take these actions to prevent or treat trouble pooping: Drink enough fluid to keep your pee (urine) pale yellow. Take over-the-counter or prescription medicines. Eat foods that are high in fiber. These include beans, whole grains, and fresh fruits and vegetables. Limit foods that are high in fat and sugars. These include fried or sweet foods. General instructions  Sleep on a firm mattress. Try lying on your side with your knees slightly bent. If you lie on your back, put a pillow under your knees. Do not smoke or use any products that contain nicotine or tobacco. If you need help quitting, ask your doctor. Contact a doctor if: Your pain does not get better with rest or medicine. You have new pain. You have a fever. You lose weight quickly. You have trouble doing your normal activities. One or both of your legs or feet feel weak. One or both of your legs or feet lose feeling (have numbness). Get help right away if: You are not able to control when you pee   or poop. You have bad back pain and: You feel like you may vomit (nauseous). You vomit. You have pain in your chest or your belly (abdomen). You have shortness of breath. You faint. These symptoms may be an emergency. Get help right away. Call 911. Do not wait to see if the symptoms will go away. Do not drive yourself to the hospital. This information is not intended to replace advice given to you by your health care provider. Make sure you discuss any questions you have with your health care provider. Document Revised: 01/17/2022 Document Reviewed: 01/17/2022 Elsevier Patient Education   2023 Elsevier Inc.  

## 2022-10-28 NOTE — Progress Notes (Signed)
  Care Coordination Note  10/28/2022 Name: KENNON BEBEAU MRN: [id number] DOB: 09-12-1960  [name] is a 62 y.o. year old female who is a primary care patient of Georgina Quint, MD and is actively engaged with the care management team. I reached out to [name] by phone today to assist with scheduling an initial visit with the RN Case Manager  Follow up plan: Telephone appointment with care management team member scheduled for: 11/11/2022  Burman Nieves, [facility] Care Coordination Care Guide Direct Dial: [phone]

## 2022-10-31 DIAGNOSIS — E559 Vitamin D deficiency, unspecified: Secondary | ICD-10-CM | POA: Diagnosis not present

## 2022-10-31 DIAGNOSIS — M545 Low back pain, unspecified: Secondary | ICD-10-CM | POA: Diagnosis not present

## 2022-10-31 DIAGNOSIS — M542 Cervicalgia: Secondary | ICD-10-CM | POA: Diagnosis not present

## 2022-10-31 DIAGNOSIS — E119 Type 2 diabetes mellitus without complications: Secondary | ICD-10-CM | POA: Diagnosis not present

## 2022-10-31 DIAGNOSIS — R768 Other specified abnormal immunological findings in serum: Secondary | ICD-10-CM | POA: Diagnosis not present

## 2022-10-31 DIAGNOSIS — M5136 Other intervertebral disc degeneration, lumbar region: Secondary | ICD-10-CM | POA: Diagnosis not present

## 2022-10-31 DIAGNOSIS — Z79899 Other long term (current) drug therapy: Secondary | ICD-10-CM | POA: Diagnosis not present

## 2022-10-31 DIAGNOSIS — I10 Essential (primary) hypertension: Secondary | ICD-10-CM | POA: Diagnosis not present

## 2022-11-01 DIAGNOSIS — E1165 Type 2 diabetes mellitus with hyperglycemia: Secondary | ICD-10-CM | POA: Diagnosis not present

## 2022-11-02 ENCOUNTER — Telehealth: Payer: Self-pay

## 2022-11-02 DIAGNOSIS — Z79899 Other long term (current) drug therapy: Secondary | ICD-10-CM | POA: Diagnosis not present

## 2022-11-02 NOTE — Telephone Encounter (Signed)
Left vm that patient will need to reach out to pcp for sciatica management

## 2022-11-06 ENCOUNTER — Other Ambulatory Visit: Payer: Self-pay | Admitting: Primary Care

## 2022-11-08 DIAGNOSIS — G4733 Obstructive sleep apnea (adult) (pediatric): Secondary | ICD-10-CM | POA: Diagnosis not present

## 2022-11-09 ENCOUNTER — Telehealth: Payer: Self-pay | Admitting: Podiatry

## 2022-11-09 ENCOUNTER — Other Ambulatory Visit: Payer: Self-pay | Admitting: Podiatry

## 2022-11-09 DIAGNOSIS — R609 Edema, unspecified: Secondary | ICD-10-CM

## 2022-11-09 NOTE — Telephone Encounter (Signed)
Patient called wants to know ifyou will call her in compression hose to the [facility] and [place] ?

## 2022-11-11 ENCOUNTER — Ambulatory Visit: Payer: Self-pay

## 2022-11-11 NOTE — Patient Instructions (Addendum)
Visit Information  Thank you for taking time to visit with me today. Please don't hesitate to contact me if I can be of assistance to you.   Following are the goals we discussed today:  Continue to take medications as prescribed Continue to attend provider visits as scheduled/recommended Call RN Care Coordinator if care coordination needs between now and our next telephone visit Continue to maintain safety prevention strategies discussed   Our next appointment is by telephone on 12/09/22 at 9:30 am  Please call the care guide team at [phone] if you need to cancel or reschedule your appointment.   If you are experiencing a Mental Health or Behavioral Health Crisis or need someone to talk to, please call the Suicide and Crisis Lifeline: 7  Kathyrn Sheriff, RN, MSN, BSN, CCM [facility] Care Coordinator [phone]   Sciatica  Sciatica is pain, weakness, tingling, or loss of feeling (numbness) along the sciatic nerve. The sciatic nerve starts in the lower back and goes down the back of each leg. Sciatica usually affects one side of the body. Sciatica usually goes away on its own or with treatment. Sometimes, sciatica may come back. What are the causes? This condition happens when the sciatic nerve is pinched or has pressure put on it. This may be caused by: A disk in between the bones of the spine bulging out too far (herniated disk). Changes in the spinal disks due to aging. A condition that affects a muscle in the butt. Extra bone growth near the sciatic nerve. A break (fracture) of the area between your hip bones (pelvis). Pregnancy. Tumor. This is rare. What increases the risk? You are more likely to develop this condition if you: Play sports that put pressure or stress on the spine. Have poor strength and ease of movement (flexibility). Have had a back injury or back surgery. Sit for long periods of time. Do activities that involve bending or lifting over and over again. Are  very overweight (obese). What are the signs or symptoms? Symptoms can vary from mild to very bad. They may include: Any of these problems in the lower back, leg, hip, or butt: Mild tingling, loss of feeling, or dull aches. A burning feeling. Sharp pains. Loss of feeling in the back of the calf or the sole of the foot. Leg weakness. Very bad back pain that makes it hard to move. These symptoms may get worse when you cough, sneeze, or laugh. They may also get worse when you sit or stand for long periods of time. How is this treated? This condition often gets better without any treatment. However, treatment may include: Changing or cutting back on physical activity when you have pain. Exercising, including strengthening and stretching. Putting ice or heat on the affected area. Shots of medicines to relieve pain and swelling or to relax your muscles. Surgery. Follow these instructions at home: Medicines Take over-the-counter and prescription medicines only as told by your doctor. Ask your doctor if you should avoid driving or using machines while you are taking your medicine. Managing pain     If told, put ice on the affected area. To do this: Put ice in a plastic bag. Place a towel between your skin and the bag. Leave the ice on for 20 minutes, 2-3 times a day. If your skin turns bright red, take off the ice right away to prevent skin damage. The risk of skin damage is higher if you cannot feel pain, heat, or cold. If told, put heat  on the affected area. Do this as often as told by your doctor. Use the heat source that your doctor tells you to use, such as a moist heat pack or a heating pad. Place a towel between your skin and the heat source. Leave the heat on for 20-30 minutes. If your skin turns bright red, take off the heat right away to prevent burns. The risk of burns is higher if you cannot feel pain, heat, or cold. Activity  Return to your normal activities when your doctor  says that it is safe. Avoid activities that make your symptoms worse. Take short rests during the day. When you rest for a long time, do some physical activity or stretching between periods of rest. Avoid sitting for a long time without moving. Get up and move around at least one time each hour. Do exercises and stretches as told by your doctor. Do not lift anything that is heavier than 10 lb (4.5 kg). Avoid lifting heavy things even when you do not have symptoms. Avoid lifting heavy things over and over. When you lift objects, always lift in a way that is safe for your body. To do this, you should: Bend your knees. Keep the object close to your body. Avoid twisting. General instructions Stay at a healthy weight. Wear comfortable shoes that support your feet. Avoid wearing high heels. Avoid sleeping on a mattress that is too soft or too hard. You might have less pain if you sleep on a mattress that is firm enough to support your back. Contact a doctor if: Your pain is not controlled by medicine. Your pain does not get better. Your pain gets worse. Your pain lasts longer than 4 weeks. You lose weight without trying. Get help right away if: You cannot control when you pee (urinate) or poop (have a bowel movement). You have weakness in any of these areas and it gets worse: Lower back. The area between your hip bones. Butt. Legs. You have redness or swelling of your back. You have a burning feeling when you pee. Summary Sciatica is pain, weakness, tingling, or loss of feeling (numbness) along the sciatic nerve. This may include the lower back, legs, hips, and butt. This condition happens when the sciatic nerve is pinched or has pressure put on it. Treatment often includes rest, exercise, medicines, and putting ice or heat on the affected area. This information is not intended to replace advice given to you by your health care provider. Make sure you discuss any questions you have with  your health care provider. Document Revised: 09/06/2021 Document Reviewed: 09/06/2021 Elsevier Patient Education  2024 [employer].

## 2022-11-11 NOTE — Patient Outreach (Signed)
  Care Coordination   Initial Visit Note   11/11/2022 Name: [name] MRN: [id number] DOB: Apr 19, 1961  [name] is a 62 y.o. year old female who sees Sagardia, Eilleen Kempf, MD for primary care. I spoke with  [name] by phone today.  What matters to the patients health and wellness today?  [name] reports blood sugars and blood pressure controlled. She states her primary concern is related to Sciatica pain. Reports currently being treated with Prednisone which has helped. She continue to monitor blood sugar. She report she has received her blood pressure monitor supplied by [facility], but has not used yet. She is scheduled to see Dr. Franky Macho, neurosurgeon on 11/15/22. She expresses no additional concerns a this time.   Goals Addressed               This Visit's Progress     COMPLETED: Improve my diet by carb counting to help manage my DM and lose weight.(starting wt:244) (pt-stated)        Care Coordination Interventions: Provided verbal and/or written education to patient re: provider recommended life style modifications  Reviewed recommended dietary changes: avoid fad diets, make small/incremental dietary and exercise changes, eat at the table and avoid eating in front of the TV, plan management of cravings, monitor snacking and cravings in food diary        Sciatica relief        Interventions Today    Flowsheet Row Most Recent Value  Chronic Disease   Chronic disease during today's visit Diabetes, Other, Hypertension (HTN), Chronic Obstructive Pulmonary Disease (COPD)  [Sciatica]  General Interventions   General Interventions Discussed/Reviewed General Interventions Discussed, Doctor Visits  [discusssed care coordination program and care coordination team. proviced RN Care Coordinator contact number and encouraged to call for care coordination needs]  Doctor Visits Discussed/Reviewed Doctor Visits Discussed  [reviewed upcoming appointments]  Exercise Interventions   Exercise  Discussed/Reviewed --  [encourged patient to discuss physical therapy/stretches with provider at next visit to see if could be beneficial to ease Sciatic pain for patient]  Education Interventions   Education Provided Provided Education, Provided Web-based Education  [reiterated the importance of continuing to manage and control blood sugar and blood pressure during sciatic episode.]  Provided Verbal Education On When to see the doctor, Blood Sugar Monitoring, Exercise, Medication  [strategies to help relive sciatica: medication, heat/cold, stretching/movement.]  Pharmacy Interventions   Pharmacy Dicussed/Reviewed Pharmacy Topics Discussed  Safety Interventions   Safety Discussed/Reviewed Safety Discussed  [discussed fall prevention strategies: assistive device, clear walkway, good lighing, remove tripping hazzards]            SDOH assessments and interventions completed:  Yes  SDOH Interventions Today    Flowsheet Row Most Recent Value  SDOH Interventions   Food Insecurity Interventions Intervention Not Indicated  Housing Interventions Intervention Not Indicated  Transportation Interventions Intervention Not Indicated  Utilities Interventions Intervention Not Indicated     Care Coordination Interventions:  Yes, provided   Follow up plan: Follow up call scheduled for 12/09/22    Encounter Outcome:  Pt. Visit Completed   Kathyrn Sheriff, RN, MSN, BSN, CCM [facility] Care Coordinator [phone]

## 2022-11-15 ENCOUNTER — Telehealth: Payer: Self-pay | Admitting: Emergency Medicine

## 2022-11-15 ENCOUNTER — Telehealth: Payer: Self-pay | Admitting: Podiatry

## 2022-11-15 DIAGNOSIS — M4726 Other spondylosis with radiculopathy, lumbar region: Secondary | ICD-10-CM

## 2022-11-15 NOTE — Addendum Note (Signed)
Addended by: Jerrell Belfast on: 11/15/2022 02:13 PM   Modules accepted: Orders

## 2022-11-15 NOTE — Telephone Encounter (Signed)
Received authorization for diabetic shoes and inserts from [facility]   Auth number [id number]   exp 01/10/23   Documentation sent to scan center

## 2022-11-15 NOTE — Telephone Encounter (Signed)
Patient had a referral to [facility]. She had an appointment today 11/15/2022 and went to the appointment, but she was waiting for over an hour and a half. She would like to know if she can get a referral to somewhere else since she was having trouble getting seen. Best callback is [phone].

## 2022-11-16 NOTE — Telephone Encounter (Signed)
This order has been faxed over to [place] at [facility]

## 2022-11-25 DIAGNOSIS — M5432 Sciatica, left side: Secondary | ICD-10-CM | POA: Diagnosis not present

## 2022-11-25 DIAGNOSIS — M4316 Spondylolisthesis, lumbar region: Secondary | ICD-10-CM | POA: Diagnosis not present

## 2022-11-25 DIAGNOSIS — M48062 Spinal stenosis, lumbar region with neurogenic claudication: Secondary | ICD-10-CM | POA: Diagnosis not present

## 2022-11-26 ENCOUNTER — Other Ambulatory Visit: Payer: Self-pay | Admitting: Internal Medicine

## 2022-11-27 NOTE — Progress Notes (Signed)
HPI F former smoker followed for OSA, complicated by  HBP, COPD GOLD II, Hepatic steatosis, GERD, DM2, Lung nodule< 6 cm NPSG 08/11/17- AHI 17.7/ hr, desaturation to 85%, 255 lbs PFT- 01/31/17- moderate obst, insignif resp to BD, mild DLCO deficit -----------------------------------------------------------------------------------   11/26/21- 61 yoF former smoker followed for OSA, complicated by  HTN, COPD GOLD II, Hepatic steatosis, GERD, DM2, Sciatica, Morbid Obesity, Chronic Low Back Pain/ [facility], Breast Cancer R,  Dr Sherene Sires has seen her for cough -Ventiolin, Symbicort 80, ChlorTrimeton, Singulair,  CPAP auto 10-20/ [facility] Download- compliance 97%, AHI 0.3/ hr Body weight today- Covid vax-4 Phizer -----Patient feels like she has been clearing her throat more recently in the morning and throughout the day with clear/yellow sputum possibly from the weather change. Has had some wheezing.  Download reviewed. She again talks about morning cough.  Sputum sometimes a little yellow but scant.  She is on acid blockers and does not recognize reflux.  I noted her sniffing a few times here in the office but she denies nasal drainage and takes occasional antihistamine.  Some wheeze.  We discussed trying Breztri.  She is trying to walk more for exercise and weight.  11/29/22- 20 yoF former smoker followed for OSA, complicated by  HTN, COPD GOLD II, Hepatic steatosis, GERD, DM2, Sciatica, Morbid Obesity, Chronic Low Back Pain/ [facility], Breast Cancer R,  Dr Sherene Sires has seen her for cough -Ventiolin, Symbicort 160, ChlorTrimeton, Singulair,  CPAP auto 10-20/ [facility] Download- compliance 100%, AHI 0.1/ hr Body weight today-241 lbs Download reviewed.  Doing very well with CPAP. Still complains of throat clearing on Nexium plus Pepcid twice daily.  Cloudy yellow mucus in back of throat and gets some frontal headaches.  ROS-see HPI   + = positive Constitutional:    weight loss, night sweats,  fevers, chills, fatigue, lassitude. HEENT:   + headaches, difficulty swallowing, tooth/dental problems, sore throat,       sneezing, itching, ear ache, +nasal congestion, post nasal drip, snoring CV:    chest pain, orthopnea, PND, +swelling in lower extremities, anasarca,                                  dizziness, palpitations Resp:  + shortness of breath with exertion or at rest.                +productive cough,   non-productive cough, coughing up of blood.              change in color of mucus. + wheezing.   Skin:    rash or lesions. GI:  + heartburn, indigestion, +abdominal pain, nausea, vomiting, diarrhea,                 change in bowel habits, loss of appetite GU: dysuria, change in color of urine, no urgency or frequency.   flank pain. MS:   +joint pain, stiffness, decreased range of motion, back pain. Neuro-     nothing unusual Psych:  change in mood or affect.  depression or anxiety.   memory loss.  OBJ- Physical Exam General- Alert, Oriented, Affect-appropriate, Distress- none acute, + obese Skin- rash-none, lesions- none, excoriation- none Lymphadenopathy- none Head- atraumatic            Eyes- Gross vision intact, PERRLA, conjunctivae and secretions clear            Ears- Hearing, canals-normal  Nose- Clear, no-Septal dev, mucus, polyps, erosion, perforation             Throat- Mallampati IV , mucosa clear , drainage- none, tonsils- atrophic Neck- flexible , trachea midline, no stridor , thyroid nl, carotid no bruit Chest - symmetrical excursion , unlabored           Heart/CV- RRR , no murmur , no gallop  , no rub, nl s1 s2                           - JVD- none , edema- none, stasis changes- none, varices- none           Lung- clear to P&A, wheeze- none, cough- none , dullness-none, rub- none           Chest wall-  Abd-  Br/ Gen/ Rectal- Not done, not indicated Extrem- cyanosis- none, clubbing, none, atrophy- none, strength- nl Neuro- grossly intact to  observation

## 2022-11-28 ENCOUNTER — Ambulatory Visit (INDEPENDENT_AMBULATORY_CARE_PROVIDER_SITE_OTHER): Payer: PPO | Admitting: Podiatry

## 2022-11-28 ENCOUNTER — Other Ambulatory Visit: Payer: Self-pay | Admitting: Primary Care

## 2022-11-28 DIAGNOSIS — M2041 Other hammer toe(s) (acquired), right foot: Secondary | ICD-10-CM

## 2022-11-28 DIAGNOSIS — M2042 Other hammer toe(s) (acquired), left foot: Secondary | ICD-10-CM

## 2022-11-28 DIAGNOSIS — E1149 Type 2 diabetes mellitus with other diabetic neurological complication: Secondary | ICD-10-CM

## 2022-11-28 DIAGNOSIS — E1165 Type 2 diabetes mellitus with hyperglycemia: Secondary | ICD-10-CM

## 2022-11-28 DIAGNOSIS — M7752 Other enthesopathy of left foot: Secondary | ICD-10-CM

## 2022-11-28 NOTE — Progress Notes (Signed)
Patient presents today to pick up diabetic shoes and insoles.  Patient was dispensed 1 pair of diabetic shoes and 3 pairs of foam casted diabetic insoles. Fit was satisfactory. Instructions for break-in and wear was reviewed and a copy was given to the patient.   Re-appointment for regularly scheduled diabetic foot care visits or if they should experience any trouble with the shoes or insoles.   Patient also states that [facility] does not handle compression hose, can you sent it to another medical supply place?

## 2022-11-29 ENCOUNTER — Encounter: Payer: Self-pay | Admitting: Internal Medicine

## 2022-11-29 ENCOUNTER — Other Ambulatory Visit: Payer: Self-pay | Admitting: Internal Medicine

## 2022-11-29 ENCOUNTER — Ambulatory Visit: Payer: PPO | Admitting: Internal Medicine

## 2022-11-29 ENCOUNTER — Ambulatory Visit (INDEPENDENT_AMBULATORY_CARE_PROVIDER_SITE_OTHER): Payer: PPO

## 2022-11-29 VITALS — BP 130/88 | HR 76 | Ht 64.0 in | Wt 241.1 lb

## 2022-11-29 DIAGNOSIS — M5136 Other intervertebral disc degeneration, lumbar region: Secondary | ICD-10-CM | POA: Diagnosis not present

## 2022-11-29 DIAGNOSIS — E119 Type 2 diabetes mellitus without complications: Secondary | ICD-10-CM | POA: Diagnosis not present

## 2022-11-29 DIAGNOSIS — E559 Vitamin D deficiency, unspecified: Secondary | ICD-10-CM | POA: Diagnosis not present

## 2022-11-29 DIAGNOSIS — R053 Chronic cough: Secondary | ICD-10-CM | POA: Diagnosis not present

## 2022-11-29 DIAGNOSIS — I10 Essential (primary) hypertension: Secondary | ICD-10-CM | POA: Diagnosis not present

## 2022-11-29 DIAGNOSIS — Z79899 Other long term (current) drug therapy: Secondary | ICD-10-CM | POA: Diagnosis not present

## 2022-11-29 DIAGNOSIS — E1165 Type 2 diabetes mellitus with hyperglycemia: Secondary | ICD-10-CM | POA: Diagnosis not present

## 2022-11-29 DIAGNOSIS — G4733 Obstructive sleep apnea (adult) (pediatric): Secondary | ICD-10-CM | POA: Diagnosis not present

## 2022-11-29 DIAGNOSIS — M542 Cervicalgia: Secondary | ICD-10-CM | POA: Diagnosis not present

## 2022-11-29 DIAGNOSIS — R768 Other specified abnormal immunological findings in serum: Secondary | ICD-10-CM | POA: Diagnosis not present

## 2022-11-29 DIAGNOSIS — J449 Chronic obstructive pulmonary disease, unspecified: Secondary | ICD-10-CM | POA: Diagnosis not present

## 2022-11-29 NOTE — Patient Instructions (Signed)
Order- CXR  dx chronic cough  Order- DME [facility]- please replace old CPAP machine, auto 10-20, mask of choice, humidifier, supplies, and please install AirView/ card  Script sent for Zpak antibiotic- see if this helps cough  Try to fight the urge to clear your throat repeatedly- that irritates it. Use sips of water, sugar-free hard candy lozenges like [employer] (in bags at [employer], [facility])  If You don't hear from [facility] in a week then call them

## 2022-12-02 DIAGNOSIS — Z79899 Other long term (current) drug therapy: Secondary | ICD-10-CM | POA: Diagnosis not present

## 2022-12-05 ENCOUNTER — Telehealth: Payer: Self-pay | Admitting: Internal Medicine

## 2022-12-05 NOTE — Telephone Encounter (Signed)
Please let patient know this. And please ask [facility] to replace machine as ordered in July when she is eligible.

## 2022-12-05 NOTE — Telephone Encounter (Signed)
Per note from Terri C with [facility] "This patient rcvd her first machine from the [place] center. She is not eligible for a new one until 01/03/2023.

## 2022-12-06 NOTE — Telephone Encounter (Signed)
I have sent a message to [facility] about using order to replace cpap in 12/2022

## 2022-12-07 NOTE — Telephone Encounter (Signed)
I received a message from Terri C with [facility] in [place] "I will send the [place] office a message about this. We can use the order as long as it is not over 46 days old. I will have them set up a reminder for July."

## 2022-12-08 DIAGNOSIS — G4733 Obstructive sleep apnea (adult) (pediatric): Secondary | ICD-10-CM | POA: Diagnosis not present

## 2022-12-09 ENCOUNTER — Telehealth: Payer: Self-pay

## 2022-12-09 ENCOUNTER — Encounter: Payer: Self-pay | Admitting: Podiatry

## 2022-12-09 DIAGNOSIS — E119 Type 2 diabetes mellitus without complications: Secondary | ICD-10-CM | POA: Diagnosis not present

## 2022-12-09 DIAGNOSIS — H0102A Squamous blepharitis right eye, upper and lower eyelids: Secondary | ICD-10-CM | POA: Diagnosis not present

## 2022-12-09 DIAGNOSIS — H1045 Other chronic allergic conjunctivitis: Secondary | ICD-10-CM | POA: Diagnosis not present

## 2022-12-09 DIAGNOSIS — H0102B Squamous blepharitis left eye, upper and lower eyelids: Secondary | ICD-10-CM | POA: Diagnosis not present

## 2022-12-09 DIAGNOSIS — H40013 Open angle with borderline findings, low risk, bilateral: Secondary | ICD-10-CM | POA: Diagnosis not present

## 2022-12-09 DIAGNOSIS — H04123 Dry eye syndrome of bilateral lacrimal glands: Secondary | ICD-10-CM | POA: Diagnosis not present

## 2022-12-09 LAB — HM DIABETES EYE EXAM

## 2022-12-09 NOTE — Patient Outreach (Signed)
  Care Coordination   12/09/2022 Name: [name] MRN: [id number] DOB: August 31, 1960   Care Coordination Outreach Attempts:  An unsuccessful telephone outreach was attempted for a scheduled appointment today.  Follow Up Plan:  Additional outreach attempts will be made to offer the patient care coordination information and services.   Encounter Outcome:  No Answer   Care Coordination Interventions:  No, not indicated    Kathyrn Sheriff, RN, MSN, BSN, CCM [facility] Care Coordinator [phone]

## 2022-12-12 ENCOUNTER — Ambulatory Visit: Payer: PPO | Admitting: Nurse Practitioner

## 2022-12-12 ENCOUNTER — Encounter: Payer: Self-pay | Admitting: Nurse Practitioner

## 2022-12-12 ENCOUNTER — Other Ambulatory Visit (INDEPENDENT_AMBULATORY_CARE_PROVIDER_SITE_OTHER): Payer: PPO

## 2022-12-12 VITALS — BP 142/76 | HR 75 | Ht 64.0 in | Wt 243.4 lb

## 2022-12-12 DIAGNOSIS — R1013 Epigastric pain: Secondary | ICD-10-CM

## 2022-12-12 LAB — CBC WITH DIFFERENTIAL/PLATELET
Basophils Absolute: 0.1 10*3/uL (ref 0.0–0.1)
Basophils Relative: 0.8 % (ref 0.0–3.0)
Eosinophils Absolute: 0.1 10*3/uL (ref 0.0–0.7)
Eosinophils Relative: 1.5 % (ref 0.0–5.0)
HCT: 42.6 % (ref 36.0–46.0)
Hemoglobin: 13.6 g/dL (ref 12.0–15.0)
Lymphocytes Relative: 29.1 % (ref 12.0–46.0)
Lymphs Abs: 2 10*3/uL (ref 0.7–4.0)
MCHC: 31.9 g/dL (ref 30.0–36.0)
MCV: 81.1 fl (ref 78.0–100.0)
Monocytes Absolute: 0.5 10*3/uL (ref 0.1–1.0)
Monocytes Relative: 7.5 % (ref 3.0–12.0)
Neutro Abs: 4.2 10*3/uL (ref 1.4–7.7)
Neutrophils Relative %: 61.1 % (ref 43.0–77.0)
Platelets: 251 10*3/uL (ref 150.0–400.0)
RBC: 5.25 Mil/uL — ABNORMAL HIGH (ref 3.87–5.11)
RDW: 16.3 % — ABNORMAL HIGH (ref 11.5–15.5)
WBC: 6.9 10*3/uL (ref 4.0–10.5)

## 2022-12-12 LAB — COMPREHENSIVE METABOLIC PANEL
ALT: 29 U/L (ref 0–35)
AST: 21 U/L (ref 0–37)
Albumin: 4 g/dL (ref 3.5–5.2)
Alkaline Phosphatase: 76 U/L (ref 39–117)
BUN: 12 mg/dL (ref 6–23)
CO2: 27 mEq/L (ref 19–32)
Calcium: 9.9 mg/dL (ref 8.4–10.5)
Chloride: 104 mEq/L (ref 96–112)
Creatinine, Ser: 0.65 mg/dL (ref 0.40–1.20)
GFR: 94.41 mL/min (ref >60.00)
Glucose, Bld: 139 mg/dL — ABNORMAL HIGH (ref 70–99)
Potassium: 3.6 mEq/L (ref 3.5–5.1)
Sodium: 139 mEq/L (ref 135–145)
Total Bilirubin: 0.3 mg/dL (ref 0.2–1.2)
Total Protein: 7.4 g/dL (ref 6.0–8.3)

## 2022-12-12 LAB — LIPASE: Lipase: 32 U/L (ref 11.0–59.0)

## 2022-12-12 NOTE — Patient Instructions (Addendum)
Your provider has requested that you go to the basement level for lab work before leaving today. Press "B" on the elevator. The lab is located at the first door on the left as you exit the elevator.  Stop Dicyclomine.  FDGuard- 2 by mouth twice daily for abdominal pain  Stop Famotidine.  Purchase Nexium, will take twice daily.  Due to recent changes in healthcare laws, you may see the results of your imaging and laboratory studies on MyChart before your provider has had a chance to review them.  We understand that in some cases there may be results that are confusing or concerning to you. Not all laboratory results come back in the same time frame and the provider may be waiting for multiple results in order to interpret others.  Please give [place] 48 hours in order for your provider to thoroughly review all the results before contacting the office for clarification of your results.    Thank you for trusting me with your gastrointestinal care!   Alcide Evener, CRNP

## 2022-12-12 NOTE — Progress Notes (Unsigned)
12/12/2022 [name] [id number] 03-30-1961   Chief Complaint:  History of Present Illness: [name] is a 62 year old female with a past medical history of breast cancer 02/2021, hypertension, DM II, obesity, sleep apnea, COPD, hepatic steatosis, GERD and colon polyps. She is known by Dr. Russella Dar.   She endorses having epigastric pain which occurs 2 hours after eating.  She gets a fetal position and pain goes away. Pain lasted a few days May. Occurs two or three time monthly.  [employer] She has heartburn   Light headedness started recently   Ozempic  a few  years   She is taking Dicyclomine twice daily   Very painful throbbing   Little nausea without vomiting.      Latest Ref Rng & Units 10/22/2022    2:20 AM 02/04/2022   10:33 AM 02/24/2021   12:22 PM  CBC  WBC 4.0 - 10.5 K/uL 6.3  4.7  7.9   Hemoglobin 12.0 - 15.0 g/dL 40.9  81.1  91.4   Hematocrit 36.0 - 46.0 % 41.0  41.6  47.4   Platelets 150 - 400 K/uL 233  209.0  269        Latest Ref Rng & Units 10/22/2022    2:20 AM 06/01/2022   10:45 AM 02/04/2022   10:33 AM  CMP  Glucose 70 - 99 mg/dL 782  956  213   BUN 8 - 23 mg/dL 13  9  10    Creatinine 0.44 - 1.00 mg/dL 0.86  5.78  4.69   Sodium 135 - 145 mmol/L 140  141  141   Potassium 3.5 - 5.1 mmol/L 3.7  3.3  3.3   Chloride 98 - 111 mmol/L 105  97  96   CO2 22 - 32 mmol/L 27  35  34   Calcium 8.9 - 10.3 mg/dL 8.9  62.9  9.9   Total Protein 6.0 - 8.3 g/dL  7.8  7.6   Total Bilirubin 0.2 - 1.2 mg/dL  0.3  0.3   Alkaline Phos 39 - 117 U/L  65  61   AST 0 - 37 U/L  26  23   ALT 0 - 35 U/L  24  23      PAST GI PROCEDURES:   Colonoscopy 05/19/2022: - Three 7 to 8 mm polyps in the transverse colon and in the ascending colon, removed with a cold snare. Resected and retrieved. - Mild diverticulosis in the left colon. - Internal hemorrhoids. - The examination was otherwise normal on direct and retroflexion views. -3 year recall colonoscopy  A.   COLON,  POLYPECTOMY:  - Tubular adenoma(s), multiple adenomatous fragments.  - No high grade dysplasia or malignancy.    EGD 12/25/2019: - Normal esophagus.  - Gastritis. Biopsied.  - Normal duodenal bulb and second portion of the duodenum. Impression: - MILD CHRONIC GASTRITIS. MILD REACTIVE GASTROPATHY. - WARTHIN-STARRY STAIN IS NEGATIVE FOR HELICOBACTER PYLORI.  Current Medications, Allergies, Past Medical History, Past Surgical History, Family History and Social History were reviewed in [employer] record.   Review of Systems:   Constitutional: Negative for fever, sweats, chills or weight loss.  Respiratory: Negative for shortness of breath.   Cardiovascular: Negative for chest pain, palpitations and leg swelling.  Gastrointestinal: See HPI.  Musculoskeletal: Negative for back pain or muscle aches.  Neurological: Negative for dizziness, headaches or paresthesias.    Physical Exam: There were no vitals taken for this visit.  General: in no acute distress. Head: Normocephalic and atraumatic. Eyes: No scleral icterus. Conjunctiva pink . Ears: Normal auditory acuity. Mouth: Dentition intact. No ulcers or lesions.  Lungs: Clear throughout to auscultation. Heart: Regular rate and rhythm, no murmur. Abdomen: Soft, nontender and nondistended. No masses or hepatomegaly. Normal bowel sounds x 4 quadrants.  Rectal: *** Musculoskeletal: Symmetrical with no gross deformities. Extremities: No edema. Neurological: Alert oriented x 4. No focal deficits.  Psychological: Alert and cooperative. Normal mood and affect  Assessment and Recommendations: ***

## 2022-12-13 ENCOUNTER — Other Ambulatory Visit: Payer: Self-pay

## 2022-12-13 DIAGNOSIS — R1084 Generalized abdominal pain: Secondary | ICD-10-CM

## 2022-12-13 DIAGNOSIS — R1013 Epigastric pain: Secondary | ICD-10-CM

## 2022-12-16 DIAGNOSIS — M79605 Pain in left leg: Secondary | ICD-10-CM | POA: Diagnosis not present

## 2022-12-16 DIAGNOSIS — M79604 Pain in right leg: Secondary | ICD-10-CM | POA: Diagnosis not present

## 2022-12-16 DIAGNOSIS — R262 Difficulty in walking, not elsewhere classified: Secondary | ICD-10-CM | POA: Diagnosis not present

## 2022-12-16 DIAGNOSIS — M545 Low back pain, unspecified: Secondary | ICD-10-CM | POA: Diagnosis not present

## 2022-12-18 ENCOUNTER — Ambulatory Visit (HOSPITAL_BASED_OUTPATIENT_CLINIC_OR_DEPARTMENT_OTHER)
Admission: RE | Admit: 2022-12-18 | Discharge: 2022-12-18 | Disposition: A | Discharge status: Home / Self Care | Payer: PPO | Source: Ambulatory Visit | Attending: Nurse Practitioner | Admitting: Nurse Practitioner

## 2022-12-18 DIAGNOSIS — K760 Fatty (change of) liver, not elsewhere classified: Secondary | ICD-10-CM | POA: Diagnosis not present

## 2022-12-18 DIAGNOSIS — K5730 Diverticulosis of large intestine without perforation or abscess without bleeding: Secondary | ICD-10-CM | POA: Diagnosis not present

## 2022-12-18 DIAGNOSIS — R1013 Epigastric pain: Secondary | ICD-10-CM | POA: Diagnosis not present

## 2022-12-18 DIAGNOSIS — R1084 Generalized abdominal pain: Secondary | ICD-10-CM | POA: Insufficient documentation

## 2022-12-18 MED ORDER — IOHEXOL 300 MG/ML  SOLN
100.0000 mL | Freq: Once | INTRAMUSCULAR | Status: AC | PRN
  Administered 2022-12-18: 100 mL via INTRAVENOUS

## 2022-12-21 DIAGNOSIS — M79604 Pain in right leg: Secondary | ICD-10-CM | POA: Diagnosis not present

## 2022-12-21 DIAGNOSIS — M79605 Pain in left leg: Secondary | ICD-10-CM | POA: Diagnosis not present

## 2022-12-21 DIAGNOSIS — R262 Difficulty in walking, not elsewhere classified: Secondary | ICD-10-CM | POA: Diagnosis not present

## 2022-12-21 DIAGNOSIS — M545 Low back pain, unspecified: Secondary | ICD-10-CM | POA: Diagnosis not present

## 2022-12-22 ENCOUNTER — Telehealth: Payer: Self-pay | Admitting: Emergency Medicine

## 2022-12-22 ENCOUNTER — Other Ambulatory Visit: Payer: Self-pay | Admitting: Emergency Medicine

## 2022-12-22 DIAGNOSIS — A6004 Herpesviral vulvovaginitis: Secondary | ICD-10-CM

## 2022-12-22 DIAGNOSIS — M545 Low back pain, unspecified: Secondary | ICD-10-CM | POA: Diagnosis not present

## 2022-12-22 DIAGNOSIS — M79604 Pain in right leg: Secondary | ICD-10-CM | POA: Diagnosis not present

## 2022-12-22 DIAGNOSIS — M79605 Pain in left leg: Secondary | ICD-10-CM | POA: Diagnosis not present

## 2022-12-22 DIAGNOSIS — R262 Difficulty in walking, not elsewhere classified: Secondary | ICD-10-CM | POA: Diagnosis not present

## 2022-12-22 MED ORDER — VALACYCLOVIR HCL 500 MG PO TABS: ORAL_TABLET | ORAL | 3 refills | Status: DC

## 2022-12-22 NOTE — Telephone Encounter (Signed)
Prescription Request  12/22/2022  LOV: 10/27/2022  What is the name of the medication or equipment? valACYclovir (VALTREX) 500 MG tablet   Have you contacted your pharmacy to request a refill? No   Which pharmacy would you like this sent to?  [id number] - [place] ([place]), [place] - [address]  ([place]) [place] [address] Phone: [phone] Fax: [phone]    Patient notified that their request is being sent to the clinical staff for review and that they should receive a response within 2 business days.   Please advise at Mobile [phone] (mobile)

## 2022-12-22 NOTE — Telephone Encounter (Signed)
New prescription sent to pharmacy requested.  Thanks

## 2022-12-23 DIAGNOSIS — M4316 Spondylolisthesis, lumbar region: Secondary | ICD-10-CM | POA: Diagnosis not present

## 2022-12-23 DIAGNOSIS — M5432 Sciatica, left side: Secondary | ICD-10-CM | POA: Diagnosis not present

## 2022-12-23 DIAGNOSIS — M48062 Spinal stenosis, lumbar region with neurogenic claudication: Secondary | ICD-10-CM | POA: Diagnosis not present

## 2022-12-26 ENCOUNTER — Other Ambulatory Visit: Payer: Self-pay

## 2022-12-26 DIAGNOSIS — R1013 Epigastric pain: Secondary | ICD-10-CM

## 2022-12-26 DIAGNOSIS — R1084 Generalized abdominal pain: Secondary | ICD-10-CM

## 2022-12-26 DIAGNOSIS — Z8719 Personal history of other diseases of the digestive system: Secondary | ICD-10-CM

## 2022-12-27 DIAGNOSIS — M79605 Pain in left leg: Secondary | ICD-10-CM | POA: Diagnosis not present

## 2022-12-27 DIAGNOSIS — M545 Low back pain, unspecified: Secondary | ICD-10-CM | POA: Diagnosis not present

## 2022-12-27 DIAGNOSIS — R262 Difficulty in walking, not elsewhere classified: Secondary | ICD-10-CM | POA: Diagnosis not present

## 2022-12-27 DIAGNOSIS — M79604 Pain in right leg: Secondary | ICD-10-CM | POA: Diagnosis not present

## 2022-12-29 DIAGNOSIS — M79605 Pain in left leg: Secondary | ICD-10-CM | POA: Diagnosis not present

## 2022-12-29 DIAGNOSIS — R262 Difficulty in walking, not elsewhere classified: Secondary | ICD-10-CM | POA: Diagnosis not present

## 2022-12-29 DIAGNOSIS — M79604 Pain in right leg: Secondary | ICD-10-CM | POA: Diagnosis not present

## 2022-12-29 DIAGNOSIS — M545 Low back pain, unspecified: Secondary | ICD-10-CM | POA: Diagnosis not present

## 2023-01-03 DIAGNOSIS — R262 Difficulty in walking, not elsewhere classified: Secondary | ICD-10-CM | POA: Diagnosis not present

## 2023-01-03 DIAGNOSIS — M545 Low back pain, unspecified: Secondary | ICD-10-CM | POA: Diagnosis not present

## 2023-01-03 DIAGNOSIS — M79604 Pain in right leg: Secondary | ICD-10-CM | POA: Diagnosis not present

## 2023-01-03 DIAGNOSIS — M79605 Pain in left leg: Secondary | ICD-10-CM | POA: Diagnosis not present

## 2023-01-04 ENCOUNTER — Ambulatory Visit: Payer: Self-pay

## 2023-01-04 NOTE — Patient Instructions (Signed)
Visit Information  Thank you for taking time to visit with me today. Please don't hesitate to contact me if I can be of assistance to you.   Following are the goals we discussed today:   Goals Addressed             This Visit's Progress    Sciatica relief       Interventions Today    Flowsheet Row Most Recent Value  Chronic Disease   Chronic disease during today's visit Other  [Sciatica]  General Interventions   General Interventions Discussed/Reviewed General Interventions Reviewed  Education Interventions   Education Provided Provided Education  Provided Verbal Education On Other, Medication, Exercise  [advised to continuie to take medications as prescribed, attend provider visits as scheduled,  participate in therapy as recommended. encouraged to communicate with therapist on what is helping and what is not and recommendations to soothe sore muscles]            Our next appointment is by telephone on 02/02/23 at 10:30 am  Please call the care guide team at [phone] if you need to cancel or reschedule your appointment.   If you are experiencing a Mental Health or Behavioral Health Crisis or need someone to talk to, please call the Suicide and Crisis Lifeline: 51   Kathyrn Sheriff, RN, MSN, BSN, CCM [facility] Care Coordinator [phone]

## 2023-01-04 NOTE — Patient Outreach (Signed)
  Care Coordination   Follow Up Visit Note   01/04/2023 Name: [name] MRN: [id number] DOB: [birth year]  [name] is a 62 y.o. year old female who sees Sagardia, Eilleen Kempf, MD for primary care. I spoke with  [name] by phone today.  What matters to the patients health and wellness today?  States she is not at home, but reports being [facility] is attending PT at [facility]. Patient reports it is helping, but she is also experiencing soreness.  Goals Addressed             This Visit's Progress    Sciatica relief       Interventions Today    Flowsheet Row Most Recent Value  Chronic Disease   Chronic disease during today's visit Other  [Sciatica]  General Interventions   General Interventions Discussed/Reviewed General Interventions Reviewed  Education Interventions   Education Provided Provided Education  Provided Verbal Education On Other, Medication, Exercise  [advised to continuie to take medications as prescribed, attend provider visits as scheduled,  participate in therapy as recommended. encouraged to communicate with therapist on what is helping and what is not and recommendations to soothe sore muscles]            SDOH assessments and interventions completed:  No  Care Coordination Interventions:  Yes, provided   Follow up plan: Follow up call scheduled for 02/02/23    Encounter Outcome:  Pt. Visit Completed   Kathyrn Sheriff, RN, MSN, BSN, CCM [facility] Care Coordinator [phone]

## 2023-01-05 DIAGNOSIS — M79604 Pain in right leg: Secondary | ICD-10-CM | POA: Diagnosis not present

## 2023-01-05 DIAGNOSIS — R262 Difficulty in walking, not elsewhere classified: Secondary | ICD-10-CM | POA: Diagnosis not present

## 2023-01-05 DIAGNOSIS — M79605 Pain in left leg: Secondary | ICD-10-CM | POA: Diagnosis not present

## 2023-01-05 DIAGNOSIS — M545 Low back pain, unspecified: Secondary | ICD-10-CM | POA: Diagnosis not present

## 2023-01-06 DIAGNOSIS — M129 Arthropathy, unspecified: Secondary | ICD-10-CM | POA: Diagnosis not present

## 2023-01-06 DIAGNOSIS — I10 Essential (primary) hypertension: Secondary | ICD-10-CM | POA: Diagnosis not present

## 2023-01-06 DIAGNOSIS — E559 Vitamin D deficiency, unspecified: Secondary | ICD-10-CM | POA: Diagnosis not present

## 2023-01-06 DIAGNOSIS — E119 Type 2 diabetes mellitus without complications: Secondary | ICD-10-CM | POA: Diagnosis not present

## 2023-01-06 DIAGNOSIS — R768 Other specified abnormal immunological findings in serum: Secondary | ICD-10-CM | POA: Diagnosis not present

## 2023-01-06 DIAGNOSIS — M5136 Other intervertebral disc degeneration, lumbar region: Secondary | ICD-10-CM | POA: Diagnosis not present

## 2023-01-06 DIAGNOSIS — Z79899 Other long term (current) drug therapy: Secondary | ICD-10-CM | POA: Diagnosis not present

## 2023-01-06 NOTE — Assessment & Plan Note (Signed)
Benefits from CPAP with good compliance and control Plan-continue auto 10-20.  Replace old machine 

## 2023-01-06 NOTE — Assessment & Plan Note (Signed)
Not sure if she is dealing with a low-grade sinus infection or bronchitis exacerbation. Plan-chest x-ray, Z-Pak

## 2023-01-07 DIAGNOSIS — G4733 Obstructive sleep apnea (adult) (pediatric): Secondary | ICD-10-CM | POA: Diagnosis not present

## 2023-01-10 DIAGNOSIS — Z79899 Other long term (current) drug therapy: Secondary | ICD-10-CM | POA: Diagnosis not present

## 2023-01-11 DIAGNOSIS — M545 Low back pain, unspecified: Secondary | ICD-10-CM | POA: Diagnosis not present

## 2023-01-11 DIAGNOSIS — M79605 Pain in left leg: Secondary | ICD-10-CM | POA: Diagnosis not present

## 2023-01-11 DIAGNOSIS — R262 Difficulty in walking, not elsewhere classified: Secondary | ICD-10-CM | POA: Diagnosis not present

## 2023-01-11 DIAGNOSIS — M79604 Pain in right leg: Secondary | ICD-10-CM | POA: Diagnosis not present

## 2023-01-12 DIAGNOSIS — M4316 Spondylolisthesis, lumbar region: Secondary | ICD-10-CM | POA: Diagnosis not present

## 2023-01-12 DIAGNOSIS — M545 Low back pain, unspecified: Secondary | ICD-10-CM | POA: Diagnosis not present

## 2023-01-12 DIAGNOSIS — M79604 Pain in right leg: Secondary | ICD-10-CM | POA: Diagnosis not present

## 2023-01-12 DIAGNOSIS — M5416 Radiculopathy, lumbar region: Secondary | ICD-10-CM | POA: Diagnosis not present

## 2023-01-12 DIAGNOSIS — M79605 Pain in left leg: Secondary | ICD-10-CM | POA: Diagnosis not present

## 2023-01-12 DIAGNOSIS — R262 Difficulty in walking, not elsewhere classified: Secondary | ICD-10-CM | POA: Diagnosis not present

## 2023-01-12 DIAGNOSIS — M48062 Spinal stenosis, lumbar region with neurogenic claudication: Secondary | ICD-10-CM | POA: Diagnosis not present

## 2023-01-17 DIAGNOSIS — M545 Low back pain, unspecified: Secondary | ICD-10-CM | POA: Diagnosis not present

## 2023-01-17 DIAGNOSIS — M79604 Pain in right leg: Secondary | ICD-10-CM | POA: Diagnosis not present

## 2023-01-17 DIAGNOSIS — R262 Difficulty in walking, not elsewhere classified: Secondary | ICD-10-CM | POA: Diagnosis not present

## 2023-01-17 DIAGNOSIS — M79605 Pain in left leg: Secondary | ICD-10-CM | POA: Diagnosis not present

## 2023-01-24 DIAGNOSIS — M79604 Pain in right leg: Secondary | ICD-10-CM | POA: Diagnosis not present

## 2023-01-24 DIAGNOSIS — M545 Low back pain, unspecified: Secondary | ICD-10-CM | POA: Diagnosis not present

## 2023-01-24 DIAGNOSIS — R262 Difficulty in walking, not elsewhere classified: Secondary | ICD-10-CM | POA: Diagnosis not present

## 2023-01-24 DIAGNOSIS — M79605 Pain in left leg: Secondary | ICD-10-CM | POA: Diagnosis not present

## 2023-01-26 DIAGNOSIS — M545 Low back pain, unspecified: Secondary | ICD-10-CM | POA: Diagnosis not present

## 2023-01-26 DIAGNOSIS — R262 Difficulty in walking, not elsewhere classified: Secondary | ICD-10-CM | POA: Diagnosis not present

## 2023-01-26 DIAGNOSIS — M79604 Pain in right leg: Secondary | ICD-10-CM | POA: Diagnosis not present

## 2023-01-26 DIAGNOSIS — M79605 Pain in left leg: Secondary | ICD-10-CM | POA: Diagnosis not present

## 2023-01-27 DIAGNOSIS — E1165 Type 2 diabetes mellitus with hyperglycemia: Secondary | ICD-10-CM | POA: Diagnosis not present

## 2023-01-28 ENCOUNTER — Encounter: Payer: Self-pay | Admitting: Certified Registered Nurse Anesthetist

## 2023-01-31 ENCOUNTER — Encounter: Payer: Self-pay | Admitting: Gastroenterology

## 2023-01-31 ENCOUNTER — Ambulatory Visit (AMBULATORY_SURGERY_CENTER): Payer: PPO | Admitting: Gastroenterology

## 2023-01-31 VITALS — BP 159/90 | HR 69 | Temp 97.7°F | Resp 16 | Ht 64.0 in | Wt 243.0 lb

## 2023-01-31 DIAGNOSIS — K319 Disease of stomach and duodenum, unspecified: Secondary | ICD-10-CM | POA: Diagnosis not present

## 2023-01-31 DIAGNOSIS — R1013 Epigastric pain: Secondary | ICD-10-CM

## 2023-01-31 DIAGNOSIS — K219 Gastro-esophageal reflux disease without esophagitis: Secondary | ICD-10-CM

## 2023-01-31 DIAGNOSIS — K3189 Other diseases of stomach and duodenum: Secondary | ICD-10-CM | POA: Diagnosis not present

## 2023-01-31 DIAGNOSIS — J449 Chronic obstructive pulmonary disease, unspecified: Secondary | ICD-10-CM | POA: Diagnosis not present

## 2023-01-31 DIAGNOSIS — G4733 Obstructive sleep apnea (adult) (pediatric): Secondary | ICD-10-CM | POA: Diagnosis not present

## 2023-01-31 MED ORDER — SODIUM CHLORIDE 0.9 % IV SOLN: 500.0000 mL | Freq: Once | INTRAVENOUS | Status: DC

## 2023-01-31 MED ORDER — ESOMEPRAZOLE MAGNESIUM 40 MG PO CPDR: 40.0000 mg | DELAYED_RELEASE_CAPSULE | Freq: Every day | ORAL | 0 refills | Status: DC

## 2023-01-31 NOTE — Patient Instructions (Addendum)
Await stomach biopsy results   Reduce Nexium to 40 mg daily   Continue present medications including FBgard 1-2 by mouth three times a day as needed   Resume usual diet      YOU HAD AN ENDOSCOPIC PROCEDURE TODAY AT THE [facility]:   Refer to the procedure report that was given to you for any specific questions about what was found during the examination.  If the procedure report does not answer your questions, please call your gastroenterologist to clarify.  If you requested that your care partner not be given the details of your procedure findings, then the procedure report has been included in a sealed envelope for you to review at your convenience later.  YOU SHOULD EXPECT: Some feelings of bloating in the abdomen. Passage of more gas than usual.  Walking can help get rid of the air that was put into your GI tract during the procedure and reduce the bloating. If you had a lower endoscopy (such as a colonoscopy or flexible sigmoidoscopy) you may notice spotting of blood in your stool or on the toilet paper. If you underwent a bowel prep for your procedure, you may not have a normal bowel movement for a few days.  Please Note:  You might notice some irritation and congestion in your nose or some drainage.  This is from the oxygen used during your procedure.  There is no need for concern and it should clear up in a day or so.  SYMPTOMS TO REPORT IMMEDIATELY:  Following lower endoscopy (colonoscopy or flexible sigmoidoscopy):  Excessive amounts of blood in the stool  Significant tenderness or worsening of abdominal pains  Swelling of the abdomen that is new, acute  Fever of 100F or higher  Following upper endoscopy (EGD)  Vomiting of blood or coffee ground material  New chest pain or pain under the shoulder blades  Painful or persistently difficult swallowing  New shortness of breath  Fever of 100F or higher  Black, tarry-looking stools  For urgent or emergent  issues, a gastroenterologist can be reached at any hour by calling [phone]. Do not use MyChart messaging for urgent concerns.    DIET:  We do recommend a small meal at first, but then you may proceed to your regular diet.  Drink plenty of fluids but you should avoid alcoholic beverages for 24 hours.  ACTIVITY:  You should plan to take it easy for the rest of today and you should NOT DRIVE or use heavy machinery until tomorrow (because of the sedation medicines used during the test).    FOLLOW UP: Our staff will call the number listed on your records the next business day following your procedure.  We will call around 7:15- 8:00 am to check on you and address any questions or concerns that you may have regarding the information given to you following your procedure. If we do not reach you, we will leave a message.     If any biopsies were taken you will be contacted by phone or by letter within the next 1-3 weeks.  Please call us at [phone] if you have not heard about the biopsies in 3 weeks.    SIGNATURES/CONFIDENTIALITY: You and/or your care partner have signed paperwork which will be entered into your electronic medical record.  These signatures attest to the fact that that the information above on your After Visit Summary has been reviewed and is understood.  Full responsibility of the confidentiality of this discharge  information lies with you and/or your care-partner.

## 2023-01-31 NOTE — Op Note (Signed)
[facility] Patient Name: [name] Procedure Date: 01/31/2023 3:25 PM MRN: [id number] Endoscopist: Meryl Dare , MD, [phone] Age: 62 Referring MD:  Date of Birth: Jan 14, 1961 Gender: Female Account #: [id number] Procedure:                Upper GI endoscopy Indications:              Epigastric abdominal pain Medicines:                Monitored Anesthesia Care Procedure:                Pre-Anesthesia Assessment:                           - Prior to the procedure, a History and Physical                            was performed, and patient medications and                            allergies were reviewed. The patient's tolerance of                            previous anesthesia was also reviewed. The risks                            and benefits of the procedure and the sedation                            options and risks were discussed with the patient.                            All questions were answered, and informed consent                            was obtained. Prior Anticoagulants: The patient has                            taken no anticoagulant or antiplatelet agents. ASA                            Grade Assessment: III - A patient with severe                            systemic disease. After reviewing the risks and                            benefits, the patient was deemed in satisfactory                            condition to undergo the procedure.                           After obtaining informed consent, the endoscope was  passed under direct vision. Throughout the                            procedure, the patient's blood pressure, pulse, and                            oxygen saturations were monitored continuously. The                            Olympus Scope [id number] was introduced through the                            mouth, and advanced to the second part of duodenum.                            The upper GI  endoscopy was accomplished without                            difficulty. The patient tolerated the procedure                            well. Scope In: Scope Out: Findings:                 The examined esophagus was normal.                           Patchy mildly erythematous mucosa without bleeding                            was found in the gastric body and in the gastric                            antrum. Biopsies were taken with a cold forceps for                            histology.                           The exam of the stomach was otherwise normal.                           The duodenal bulb and second portion of the                            duodenum were normal. Complications:            No immediate complications. Estimated Blood Loss:     Estimated blood loss was minimal. Impression:               - Normal esophagus.                           - Erythematous mucosa in the gastric body and  antrum. Biopsied.                           - Normal duodenal bulb and second portion of the                            duodenum. Recommendation:           - Patient has a contact number available for                            emergencies. The signs and symptoms of potential                            delayed complications were discussed with the                            patient. Return to normal activities tomorrow.                            Written discharge instructions were provided to the                            patient.                           - Resume previous diet.                           - Continue present medications including FDgard 1-2                            po tid prn.                           - Reduce Nexium to 40 mg po qd.                           - Await pathology results. Meryl Dare, MD 01/31/2023 3:47:44 PM This report has been signed electronically.

## 2023-01-31 NOTE — Progress Notes (Signed)
Report given to PACU, vss 

## 2023-01-31 NOTE — Progress Notes (Signed)
History & Physical  Primary Care Physician:  Georgina Quint, MD Primary Gastroenterologist: Claudette Head, MD  Impression / Plan:  Chronic epigastric pain for EGD.  CHIEF COMPLAINT: Chronic epigastric pain  HPI: [name] is a 62 y.o. female with chronic epigastric pain for EGD.   Past Medical History:  Diagnosis Date   Allergy    Arthritis    back    Asthma    AS CHILD   Cancer (HCC)    breast CA- Right,   Chronic back pain    Chronic leg pain    due to back pain   Cocaine abuse (HCC)    in remission   COPD (chronic obstructive pulmonary disease) (HCC)    Diabetes (HCC)    with neuropathy   GERD (gastroesophageal reflux disease)    Hyperlipidemia    Hypertension    Internal hemorrhoids    Intraductal papilloma of left breast 02/18/2016   Neuromuscular disorder (HCC)    neuropathy   Personal history of radiation therapy    Postlaminectomy syndrome of lumbar region 12/07/2011   RECTAL BLEEDING 12/09/2008   Annotation: s/p EGD 7/08 mild gastritis, s/p colonoscopy 7/08- benign polyp  s/p polypectomy and isolated diverticulum.  Qualifier: Diagnosis of  By: Ditzler RN, Debra     Sciatica    per patient    Sleep apnea    CPAP    YEARS AGO DONE 1/2 YEARS AGO AND WAS TOLD DID NOT HAVE   Tobacco abuse    Tubular adenoma of colon    Uterine fibroid    s/p hysterectomy    Past Surgical History:  Procedure Laterality Date   BACK SURGERY  2012   L5-S1 microendoscopic disectomy last surgery 06/2011   BREAST BIOPSY     LEFT    01/19/16   BREAST BIOPSY Right 02/16/2021   BREAST EXCISIONAL BIOPSY Left 02/2016   BREAST LUMPECTOMY Right 03/12/2021   BREAST LUMPECTOMY WITH RADIOACTIVE SEED AND SENTINEL LYMPH NODE BIOPSY Right 03/12/2021   Procedure: RIGHT BREAST LUMPECTOMY WITH RADIOACTIVE SEED AND SENTINEL LYMPH NODE BIOPSY;  Surgeon: Griselda Miner, MD;  Location: [facility] OR;  Service: General;  Laterality: Right;   BREAST REDUCTION SURGERY  1982   CATARACT  EXTRACTION Right 11/07/2019   COLONOSCOPY     COLONOSCOPY WITH PROPOFOL N/A 05/19/2022   Procedure: COLONOSCOPY WITH PROPOFOL;  Surgeon: Meryl Dare, MD;  Location: [facility];  Service: Gastroenterology;  Laterality: N/A;   HAND SURGERY     MIDDLE TRIGGER FINGER RIGHT SIDE   POLYPECTOMY  05/19/2022   Procedure: POLYPECTOMY;  Surgeon: Meryl Dare, MD;  Location: [facility];  Service: Gastroenterology;;   RADIOACTIVE SEED GUIDED EXCISIONAL BREAST BIOPSY Left 02/18/2016   Procedure: LEFT RADIOACTIVE SEED GUIDED EXCISIONAL BREAST BIOPSY;  Surgeon: Ovidio Kin, MD;  Location: [facility] OR;  Service: General;  Laterality: Left;   REDUCTION MAMMAPLASTY Bilateral    TONSILLECTOMY  2008   TOTAL ABDOMINAL HYSTERECTOMY  05/24/2007   hysterectomy   TUBAL LIGATION     UPPER GASTROINTESTINAL ENDOSCOPY      Prior to Admission medications   Medication Sig Start Date End Date Taking? Authorizing Provider  Blood Glucose Monitoring Suppl (ONETOUCH VERIO FLEX SYSTEM) w/Device KIT 1 each by Does not apply route 3 (three) times daily. 01/12/17  Yes John Giovanni, MD  Cholecalciferol (VITAMIN D3 PO) Take 1 tablet by mouth daily.   Yes [provider]  Continuous Glucose Sensor (FREESTYLE LIBRE 2 SENSOR) MISC CHANGE  SENSOR EVERY 14 DAYS 11/28/22  Yes Shamleffer, Konrad Dolores, MD  Cyanocobalamin (B-12 PO) Take 1 tablet by mouth daily.   Yes [provider]  dapagliflozin propanediol (FARXIGA) 10 MG TABS tablet Take 1 tablet (10 mg total) by mouth daily. 09/06/22  Yes Shamleffer, Konrad Dolores, MD  diclofenac Sodium (VOLTAREN) 1 % GEL Apply 2 g topically 4 (four) times daily. Rub into affected area of foot 2 to 4 times daily Patient taking differently: Apply 2 g topically 4 (four) times daily as needed (pain). 01/14/22  Yes Vivi Barrack, DPM  esomeprazole (NEXIUM) 40 MG capsule Take 1 capsule (40 mg total) by mouth 2 (two) times daily before a meal. Take 30 minutes before  breakfast and dinner. 02/04/22  Yes Esterwood, Amy S, PA-C  ezetimibe (ZETIA) 10 MG tablet Take 1 tablet (10 mg total) by mouth daily. 09/06/22 09/06/23 Yes Shamleffer, Konrad Dolores, MD  glucose blood (FREESTYLE TEST STRIPS) test strip 1 each by Other route 3 (three) times daily. 09/06/21  Yes Shamleffer, Konrad Dolores, MD  Insulin Disposable Pump (OMNIPOD DASH PODS, GEN 4,) MISC USE ONE POD EVERY ONE AND ONE-HALF DAYS. 10/21/22  Yes Shamleffer, Konrad Dolores, MD  letrozole [facility]) 2.5 MG tablet Take 1 tablet by mouth once daily 06/20/22  Yes Serena Croissant, MD  metFORMIN (GLUCOPHAGE) 1000 MG tablet TAKE 1 TABLET BY MOUTH TWICE DAILY WITH A MEAL. 11/29/22  Yes Shamleffer, Konrad Dolores, MD  Misc Natural Products (ELDERBERRY IMMUNE COMPLEX) CHEW Chew 1 tablet by mouth daily.   Yes [provider]  montelukast (SINGULAIR) 10 MG tablet TAKE 1 TABLET BY MOUTH AT BEDTIME 11/29/22  Yes Glenford Bayley, NP  Multiple Vitamin (MULTIVITAMIN WITH MINERALS) TABS tablet Take 1 tablet by mouth daily.   Yes [provider]  NOVOLOG 100 UNIT/ML injection MAX DAILY 120 UNITS  DX E11.65 05/25/22  Yes Shamleffer, Konrad Dolores, MD  [facility] DELICA LANCETS FINE MISC Check blood sugar 3 times a day 06/22/16  Yes John Giovanni, MD  oxyCODONE-acetaminophen (PERCOCET) 10-325 MG tablet Take 1 tablet by mouth every 8 (eight) hours as needed for pain. 03/02/20  Yes [provider]  pregabalin (LYRICA) 75 MG capsule Take 75 mg by mouth 2 (two) times daily.   Yes [provider]  rosuvastatin (CRESTOR) 10 MG tablet Take 1 tablet (10 mg total) by mouth daily. 09/06/22  Yes Shamleffer, Konrad Dolores, MD  SYMBICORT 160-4.5 MCG/ACT inhaler Inhale 2 puffs by mouth twice daily 07/29/22  Yes Young, Joni Fears D, MD  acyclovir (ZOVIRAX) 400 MG tablet Take 1 tablet (400 mg total) by mouth 4 (four) times daily. 05/30/22   Georgina Quint, MD  albuterol (VENTOLIN HFA) 108 (90 Base) MCG/ACT  inhaler Inhale 2 puffs into the lungs every 6 (six) hours as needed for wheezing or shortness of breath. 05/28/20   Waymon Budge, MD  Continuous Glucose Monitor DEVI Use as directed. Patient not taking: Reported on 01/31/2023 03/28/17   John Giovanni, MD  dicyclomine (BENTYL) 10 MG capsule TAKE 1 CAPSULE BY MOUTH THREE TIMES DAILY BEFORE MEAL(S) 02/04/22   Esterwood, Amy S, PA-C  famotidine (PEPCID) 20 MG tablet Take 1 tablet (20 mg total) by mouth 2 (two) times daily as needed. Patient not taking: Reported on 01/31/2023 02/04/22   Esterwood, Amy S, PA-C  lidocaine (LIDODERM) 5 % Place 1 patch onto the skin daily. Remove & Discard patch within 12 hours or as directed by MD Patient not taking: Reported on 01/31/2023 10/22/22  Wynetta Fines, MD  NARCAN 4 MG/0.1ML LIQD nasal spray kit Place 0.4 mg into the nose once. Patient not taking: Reported on 01/31/2023 12/06/18   [provider]  Semaglutide, 2 MG/DOSE, (OZEMPIC, 2 MG/DOSE,) 8 MG/3ML SOPN Inject 2 mg into the skin once a week. 09/06/22   Shamleffer, Konrad Dolores, MD  tiZANidine (ZANAFLEX) 4 MG capsule Take 4 mg by mouth 3 (three) times daily as needed for muscle spasms. Patient not taking: Reported on 01/31/2023 07/23/21   [provider]  valACYclovir (VALTREX) 500 MG tablet TAKE 1 TABLET BY MOUTH TWICE DAILY FOR 5 DAYS AT  TIME  OF  OUTBREAK Patient not taking: Reported on 01/31/2023 12/22/22   Georgina Quint, MD  valsartan (DIOVAN) 80 MG tablet Take 1 tablet (80 mg total) by mouth daily. Patient not taking: Reported on 01/31/2023 09/28/22   Georgina Quint, MD    Current Outpatient Medications  Medication Sig Dispense Refill   Blood Glucose Monitoring Suppl (ONETOUCH VERIO FLEX SYSTEM) w/Device KIT 1 each by Does not apply route 3 (three) times daily. 1 kit 1   Cholecalciferol (VITAMIN D3 PO) Take 1 tablet by mouth daily.     Continuous Glucose Sensor (FREESTYLE LIBRE 2 SENSOR) MISC CHANGE SENSOR EVERY 14  DAYS 2 each 3   Cyanocobalamin (B-12 PO) Take 1 tablet by mouth daily.     dapagliflozin propanediol (FARXIGA) 10 MG TABS tablet Take 1 tablet (10 mg total) by mouth daily. 90 tablet 3   diclofenac Sodium (VOLTAREN) 1 % GEL Apply 2 g topically 4 (four) times daily. Rub into affected area of foot 2 to 4 times daily (Patient taking differently: Apply 2 g topically 4 (four) times daily as needed (pain).) 100 g 2   esomeprazole (NEXIUM) 40 MG capsule Take 1 capsule (40 mg total) by mouth 2 (two) times daily before a meal. Take 30 minutes before breakfast and dinner. 60 capsule 11   ezetimibe (ZETIA) 10 MG tablet Take 1 tablet (10 mg total) by mouth daily. 30 tablet 11   glucose blood (FREESTYLE TEST STRIPS) test strip 1 each by Other route 3 (three) times daily. 300 each 3   Insulin Disposable Pump (OMNIPOD DASH PODS, GEN 4,) MISC USE ONE POD EVERY ONE AND ONE-HALF DAYS. 45 each 3   letrozole (FEMARA) 2.5 MG tablet Take 1 tablet by mouth once daily 90 tablet 3   metFORMIN (GLUCOPHAGE) 1000 MG tablet TAKE 1 TABLET BY MOUTH TWICE DAILY WITH A MEAL. 180 tablet 0   Misc Natural Products (ELDERBERRY IMMUNE COMPLEX) CHEW Chew 1 tablet by mouth daily.     montelukast (SINGULAIR) 10 MG tablet TAKE 1 TABLET BY MOUTH AT BEDTIME 30 tablet 2   Multiple Vitamin (MULTIVITAMIN WITH MINERALS) TABS tablet Take 1 tablet by mouth daily.     NOVOLOG 100 UNIT/ML injection MAX DAILY 120 UNITS  DX E11.65 110 mL 3   ONETOUCH DELICA LANCETS FINE MISC Check blood sugar 3 times a day 100 each 12   oxyCODONE-acetaminophen (PERCOCET) 10-325 MG tablet Take 1 tablet by mouth every 8 (eight) hours as needed for pain.     pregabalin (LYRICA) 75 MG capsule Take 75 mg by mouth 2 (two) times daily.     rosuvastatin (CRESTOR) 10 MG tablet Take 1 tablet (10 mg total) by mouth daily. 90 tablet 3   SYMBICORT 160-4.5 MCG/ACT inhaler Inhale 2 puffs by mouth twice daily 11 g 2   acyclovir (ZOVIRAX) 400 MG tablet Take 1  tablet (400 mg total) by  mouth 4 (four) times daily. 90 tablet 0   albuterol (VENTOLIN HFA) 108 (90 Base) MCG/ACT inhaler Inhale 2 puffs into the lungs every 6 (six) hours as needed for wheezing or shortness of breath. 18 g 12   Continuous Glucose Monitor DEVI Use as directed. (Patient not taking: Reported on 01/31/2023) 1 each 0   dicyclomine (BENTYL) 10 MG capsule TAKE 1 CAPSULE BY MOUTH THREE TIMES DAILY BEFORE MEAL(S) 90 capsule 11   famotidine (PEPCID) 20 MG tablet Take 1 tablet (20 mg total) by mouth 2 (two) times daily as needed. (Patient not taking: Reported on 01/31/2023) 60 tablet 11   lidocaine (LIDODERM) 5 % Place 1 patch onto the skin daily. Remove & Discard patch within 12 hours or as directed by MD (Patient not taking: Reported on 01/31/2023) 30 patch 0   NARCAN 4 MG/0.1ML LIQD nasal spray kit Place 0.4 mg into the nose once. (Patient not taking: Reported on 01/31/2023)     Semaglutide, 2 MG/DOSE, (OZEMPIC, 2 MG/DOSE,) 8 MG/3ML SOPN Inject 2 mg into the skin once a week. 9 mL 3   tiZANidine (ZANAFLEX) 4 MG capsule Take 4 mg by mouth 3 (three) times daily as needed for muscle spasms. (Patient not taking: Reported on 01/31/2023)     valACYclovir (VALTREX) 500 MG tablet TAKE 1 TABLET BY MOUTH TWICE DAILY FOR 5 DAYS AT  TIME  OF  OUTBREAK (Patient not taking: Reported on 01/31/2023) 20 tablet 3   valsartan (DIOVAN) 80 MG tablet Take 1 tablet (80 mg total) by mouth daily. (Patient not taking: Reported on 01/31/2023) 90 tablet 3   Current Facility-Administered Medications  Medication Dose Route Frequency Provider Last Rate Last Admin   0.9 %  sodium chloride infusion  500 mL Intravenous Once Meryl Dare, MD        Allergies as of 01/31/2023 - Review Complete 01/31/2023  Allergen Reaction Noted   Breo ellipta [fluticasone furoate-vilanterol] Other (See Comments) 02/10/2022   Hydrochlorothiazide  09/06/2022   Lisinopril Cough     Family History  Problem Relation Age of Onset   Diabetes Father    Heart disease  Father    Hypertension Father    Diabetes Mother    Cancer Mother        brain   Hypertension Mother    Kidney disease Sister    Diabetes Sister    Kidney cancer Sister    Stroke Sister    Diabetes Sister    Hypertension Sister    Diabetes Sister    Hypertension Sister    Obesity Son    Colon cancer Neg Hx    Colon polyps Neg Hx    Esophageal cancer Neg Hx    Rectal cancer Neg Hx    Stomach cancer Neg Hx     Social History   Socioeconomic History   Marital status: Married    Spouse name: Not on file   Number of children: 3   Years of education: Not on file   Highest education level: Not on file  Occupational History    Comment: [employer]  Tobacco Use   Smoking status: Former    Current packs/day: 0.00    Average packs/day: 0.5 packs/day for 20.0 years (10.0 ttl pk-yrs)    Types: Cigarettes    Start date: 07/23/1976    Quit date: 07/23/1996    Years since quitting: 26.5   Smokeless tobacco: Never  Vaping Use   Vaping  status: Never Used  Substance and Sexual Activity   Alcohol use: Not Currently    Comment: recovering addict clean for 9 years   Drug use: Not Currently    Types: Cocaine, Marijuana    Comment: recovering addict clean for 16 years   Sexual activity: Yes    Birth control/protection: Surgical  Other Topics Concern   Not on file  Social History Narrative   Current Social History 01/17/2020        Patient lives with spouse in a home which is 1 story. There are not steps up to the entrance the patient uses.       Patient's method of transportation is personal car.      The highest level of education was some college.      The patient currently works part-time.      Identified important Relationships are God, husband, kids, grandkids       Pets : None       Interests / Fun: Drawing and nature       Current Stressors: Pain, Covid       Religious / Personal Beliefs: Christian       Other: None    Social Determinants of Health    Financial Resource Strain: Low Risk  (02/18/2022)   Overall Financial Resource Strain (CARDIA)    Difficulty of Paying Living Expenses: Not hard at all  Food Insecurity: No Food Insecurity (11/11/2022)   Hunger Vital Sign    Worried About Running Out of Food in the Last Year: Never true    Ran Out of Food in the Last Year: Never true  Transportation Needs: No Transportation Needs (11/11/2022)   PRAPARE - Administrator, Civil Service (Medical): No    Lack of Transportation (Non-Medical): No  Physical Activity: Insufficiently Active (02/18/2022)   Exercise Vital Sign    Days of Exercise per Week: 2 days    Minutes of Exercise per Session: 30 min  Stress: No Stress Concern Present (02/18/2022)   [employer] of Occupational Health - Occupational Stress Questionnaire    Feeling of Stress : Not at all  Social Connections: Moderately Integrated (02/18/2022)   Social Connection and Isolation Panel [NHANES]    Frequency of Communication with Friends and Family: More than three times a week    Frequency of Social Gatherings with Friends and Family: More than three times a week    Attends Religious Services: More than 4 times per year    Active Member of [employer] or Organizations: No    Attends Banker Meetings: Never    Marital Status: Married  Catering manager Violence: Not At Risk (02/18/2022)   Humiliation, Afraid, Rape, and Kick questionnaire    Fear of Current or Ex-Partner: No    Emotionally Abused: No    Physically Abused: No    Sexually Abused: No    Review of Systems:  All systems reviewed were negative except where noted in HPI.   Physical Exam:  General:  Alert, well-developed, in NAD Head:  Normocephalic and atraumatic. Eyes:  Sclera clear, no icterus.   Conjunctiva pink. Ears:  Normal auditory acuity. Mouth:  No deformity or lesions.  Neck:  Supple; no masses. Lungs:  Clear throughout to auscultation.   No wheezes, crackles, or rhonchi.  Heart:   Regular rate and rhythm; no murmurs. Abdomen:  Soft, nondistended, nontender. No masses, hepatomegaly. No palpable masses.  Normal bowel sounds.    Rectal:  Deferred  Msk:  Symmetrical without gross deformities. Extremities:  Without edema. Neurologic:  Alert and  oriented x 4; grossly normal neurologically. Skin:  Intact without significant lesions or rashes. Psych:  Alert and cooperative. Normal mood and affect.   Venita Lick. Russella Dar  01/31/2023, 3:24 PM See Loretha Stapler, [facility], to contact our on call provider

## 2023-01-31 NOTE — Progress Notes (Signed)
Pt's states no medical or surgical changes since previsit or office visit. VS assessed by D.T 

## 2023-01-31 NOTE — Progress Notes (Signed)
1440 Robinul 0.1 mg IV given due large amount of secretions upon assessment.  MD made aware, vss  

## 2023-02-01 ENCOUNTER — Telehealth: Payer: Self-pay

## 2023-02-01 DIAGNOSIS — M79605 Pain in left leg: Secondary | ICD-10-CM | POA: Diagnosis not present

## 2023-02-01 DIAGNOSIS — M545 Low back pain, unspecified: Secondary | ICD-10-CM | POA: Diagnosis not present

## 2023-02-01 DIAGNOSIS — M79604 Pain in right leg: Secondary | ICD-10-CM | POA: Diagnosis not present

## 2023-02-01 DIAGNOSIS — R262 Difficulty in walking, not elsewhere classified: Secondary | ICD-10-CM | POA: Diagnosis not present

## 2023-02-01 NOTE — Telephone Encounter (Signed)
  Follow up Call-     01/31/2023    2:08 PM  Call back number  Post procedure Call Back phone  # [phone]  Permission to leave phone message Yes   Follow up call, LVM

## 2023-02-02 ENCOUNTER — Ambulatory Visit: Payer: Self-pay

## 2023-02-02 DIAGNOSIS — M79604 Pain in right leg: Secondary | ICD-10-CM | POA: Diagnosis not present

## 2023-02-02 DIAGNOSIS — M545 Low back pain, unspecified: Secondary | ICD-10-CM | POA: Diagnosis not present

## 2023-02-02 DIAGNOSIS — M79605 Pain in left leg: Secondary | ICD-10-CM | POA: Diagnosis not present

## 2023-02-02 DIAGNOSIS — R262 Difficulty in walking, not elsewhere classified: Secondary | ICD-10-CM | POA: Diagnosis not present

## 2023-02-02 NOTE — Patient Outreach (Signed)
  Care Coordination   Follow Up Visit Note   02/02/2023 Name: TIMEKO TIDRICK MRN: [id number] DOB: 1961-02-12  FELA BOTKIN is a 62 y.o. year old female who sees Sagardia, Eilleen Kempf, MD for primary care. I spoke with  [name] by phone today.  What matters to the patients health and wellness today?  She reports she continues with outpatient therapy/rehab and reports, therapy for Sciatic: "it's going good". She expresses a discoloration on her left ankle. States she is going to contact provider for an appointment to have it assessed. Ms. Carlock reports endoscopy completed on 01/31/23. She has questions related to allergy list, medication ozempic and GI concerns. She states she will discuss her questions with her providers.  Goals Addressed             This Visit's Progress    Sciatica relief       Interventions Today    Flowsheet Row Most Recent Value  Chronic Disease   Chronic disease during today's visit Diabetes, Other  General Interventions   General Interventions Discussed/Reviewed General Interventions Reviewed, Doctor Visits  Doctor Visits Discussed/Reviewed Doctor Visits Discussed, PCP, Specialist  PCP/Specialist Visits Compliance with follow-up visit  Education Interventions   Education Provided Provided Education  Provided Verbal Education On --  [advised to continue to take medications as prescribed,reach out to your providers with questions regarding health and medication questions.]  Pharmacy Interventions   Pharmacy Dicussed/Reviewed Pharmacy Topics Discussed            SDOH assessments and interventions completed:  No  Care Coordination Interventions:  Yes, provided   Follow up plan: Follow up call scheduled for 03/13/23    Encounter Outcome:  Pt. Visit Completed   Kathyrn Sheriff, RN, MSN, BSN, CCM Care Management Coordinator [phone]

## 2023-02-02 NOTE — Patient Instructions (Signed)
Visit Information  Thank you for taking time to visit with me today. Please don't hesitate to contact me if I can be of assistance to you.   Following are the goals we discussed today:  Continue to take medications as prescribed Continue to attend provider visits as scheduled Contact provider for Health Questions or concerns.    Our next appointment is by telephone on 03/13/23 at 1:30 pm  Please call the care guide team at [phone] if you need to cancel or reschedule your appointment.   If you are experiencing a Mental Health or Behavioral Health Crisis or need someone to talk to, please call the Suicide and Crisis Lifeline: 988 call the [place] National Suicide Prevention Lifeline: 289-443-1068 or TTY: 224-137-0184 TTY (979) 215-4671) to talk to a trained counselor call 1-800-273-TALK (toll free, 24 hour hotline)  Kathyrn Sheriff, RN, MSN, BSN, CCM Care Management Coordinator [phone]

## 2023-02-03 DIAGNOSIS — Z6841 Body Mass Index (BMI) 40.0 and over, adult: Secondary | ICD-10-CM | POA: Diagnosis not present

## 2023-02-03 DIAGNOSIS — Z79899 Other long term (current) drug therapy: Secondary | ICD-10-CM | POA: Diagnosis not present

## 2023-02-03 DIAGNOSIS — R768 Other specified abnormal immunological findings in serum: Secondary | ICD-10-CM | POA: Diagnosis not present

## 2023-02-03 DIAGNOSIS — M5136 Other intervertebral disc degeneration, lumbar region: Secondary | ICD-10-CM | POA: Diagnosis not present

## 2023-02-06 DIAGNOSIS — R262 Difficulty in walking, not elsewhere classified: Secondary | ICD-10-CM | POA: Diagnosis not present

## 2023-02-06 DIAGNOSIS — M79604 Pain in right leg: Secondary | ICD-10-CM | POA: Diagnosis not present

## 2023-02-06 DIAGNOSIS — M79605 Pain in left leg: Secondary | ICD-10-CM | POA: Diagnosis not present

## 2023-02-06 DIAGNOSIS — M545 Low back pain, unspecified: Secondary | ICD-10-CM | POA: Diagnosis not present

## 2023-02-07 DIAGNOSIS — Z79899 Other long term (current) drug therapy: Secondary | ICD-10-CM | POA: Diagnosis not present

## 2023-02-07 DIAGNOSIS — G4733 Obstructive sleep apnea (adult) (pediatric): Secondary | ICD-10-CM | POA: Diagnosis not present

## 2023-02-09 DIAGNOSIS — M79605 Pain in left leg: Secondary | ICD-10-CM | POA: Diagnosis not present

## 2023-02-09 DIAGNOSIS — R262 Difficulty in walking, not elsewhere classified: Secondary | ICD-10-CM | POA: Diagnosis not present

## 2023-02-09 DIAGNOSIS — M79604 Pain in right leg: Secondary | ICD-10-CM | POA: Diagnosis not present

## 2023-02-09 DIAGNOSIS — M545 Low back pain, unspecified: Secondary | ICD-10-CM | POA: Diagnosis not present

## 2023-02-14 ENCOUNTER — Encounter: Payer: Self-pay | Admitting: Emergency Medicine

## 2023-02-14 ENCOUNTER — Ambulatory Visit (INDEPENDENT_AMBULATORY_CARE_PROVIDER_SITE_OTHER): Payer: PPO

## 2023-02-14 ENCOUNTER — Ambulatory Visit (INDEPENDENT_AMBULATORY_CARE_PROVIDER_SITE_OTHER): Payer: PPO | Admitting: Emergency Medicine

## 2023-02-14 VITALS — BP 150/70 | HR 86 | Temp 99.9°F | Resp 16 | Ht 64.0 in | Wt 248.2 lb

## 2023-02-14 DIAGNOSIS — E1140 Type 2 diabetes mellitus with diabetic neuropathy, unspecified: Secondary | ICD-10-CM

## 2023-02-14 DIAGNOSIS — M7732 Calcaneal spur, left foot: Secondary | ICD-10-CM | POA: Diagnosis not present

## 2023-02-14 DIAGNOSIS — I152 Hypertension secondary to endocrine disorders: Secondary | ICD-10-CM | POA: Diagnosis not present

## 2023-02-14 DIAGNOSIS — Z7985 Long-term (current) use of injectable non-insulin antidiabetic drugs: Secondary | ICD-10-CM

## 2023-02-14 DIAGNOSIS — G894 Chronic pain syndrome: Secondary | ICD-10-CM

## 2023-02-14 DIAGNOSIS — M25572 Pain in left ankle and joints of left foot: Secondary | ICD-10-CM

## 2023-02-14 DIAGNOSIS — G8929 Other chronic pain: Secondary | ICD-10-CM | POA: Insufficient documentation

## 2023-02-14 DIAGNOSIS — E785 Hyperlipidemia, unspecified: Secondary | ICD-10-CM

## 2023-02-14 DIAGNOSIS — K219 Gastro-esophageal reflux disease without esophagitis: Secondary | ICD-10-CM | POA: Diagnosis not present

## 2023-02-14 DIAGNOSIS — E1149 Type 2 diabetes mellitus with other diabetic neurological complication: Secondary | ICD-10-CM

## 2023-02-14 DIAGNOSIS — J449 Chronic obstructive pulmonary disease, unspecified: Secondary | ICD-10-CM | POA: Diagnosis not present

## 2023-02-14 DIAGNOSIS — G4733 Obstructive sleep apnea (adult) (pediatric): Secondary | ICD-10-CM

## 2023-02-14 DIAGNOSIS — M4726 Other spondylosis with radiculopathy, lumbar region: Secondary | ICD-10-CM

## 2023-02-14 DIAGNOSIS — E1169 Type 2 diabetes mellitus with other specified complication: Secondary | ICD-10-CM | POA: Diagnosis not present

## 2023-02-14 DIAGNOSIS — E1159 Type 2 diabetes mellitus with other circulatory complications: Secondary | ICD-10-CM

## 2023-02-14 DIAGNOSIS — L819 Disorder of pigmentation, unspecified: Secondary | ICD-10-CM | POA: Insufficient documentation

## 2023-02-14 DIAGNOSIS — L723 Sebaceous cyst: Secondary | ICD-10-CM

## 2023-02-14 MED ORDER — VALSARTAN 160 MG PO TABS: 160.0000 mg | ORAL_TABLET | Freq: Every day | ORAL | 3 refills | Status: DC

## 2023-02-14 NOTE — Assessment & Plan Note (Signed)
Continues pregabalin 75 mg twice a day

## 2023-02-14 NOTE — Assessment & Plan Note (Signed)
BP Readings from Last 3 Encounters:  02/14/23 (!) 150/70  01/31/23 (!) 159/90  12/12/22 (!) 142/76  Uncontrolled hypertension. Recommend to increase valsartan to 160 mg daily New prescription sent to pharmacy of record Uncontrolled diabetes with hemoglobin A1c of 7.8. Cardiovascular risks associated with uncontrolled hypertension and uncontrolled diabetes discussed Diet and nutrition discussed Recently taken off Ozempic due to GI side effects Continues insulin pump, Farxiga 10 mg daily, and metformin 1000 mg twice a day Has follow-up appointment with endocrinologist later this month

## 2023-02-14 NOTE — Assessment & Plan Note (Addendum)
Uncontrolled diabetes with hemoglobin A1c at 7.8 Continues insulin, Farxiga, metformin Recently taken off Ozempic Continues rosuvastatin 10 mg daily Diet and nutrition discussed

## 2023-02-14 NOTE — Patient Instructions (Signed)
Increase dose of valsartan to 160 mg daily.  New prescription sent to pharmacy of record today. Continue all other medications  Hypertension, Adult High blood pressure (hypertension) is when the force of blood pumping through the arteries is too strong. The arteries are the blood vessels that carry blood from the heart throughout the body. Hypertension forces the heart to work harder to pump blood and may cause arteries to become narrow or stiff. Untreated or uncontrolled hypertension can lead to a heart attack, heart failure, a stroke, kidney disease, and other problems. A blood pressure reading consists of a higher number over a lower number. Ideally, your blood pressure should be below 120/80. The first ("top") number is called the systolic pressure. It is a measure of the pressure in your arteries as your heart beats. The second ("bottom") number is called the diastolic pressure. It is a measure of the pressure in your arteries as the heart relaxes. What are the causes? The exact cause of this condition is not known. There are some conditions that result in high blood pressure. What increases the risk? Certain factors may make you more likely to develop high blood pressure. Some of these risk factors are under your control, including: Smoking. Not getting enough exercise or physical activity. Being overweight. Having too much fat, sugar, calories, or salt (sodium) in your diet. Drinking too much alcohol. Other risk factors include: Having a personal history of heart disease, diabetes, high cholesterol, or kidney disease. Stress. Having a family history of high blood pressure and high cholesterol. Having obstructive sleep apnea. Age. The risk increases with age. What are the signs or symptoms? High blood pressure may not cause symptoms. Very high blood pressure (hypertensive crisis) may cause: Headache. Fast or irregular heartbeats (palpitations). Shortness of breath. Nosebleed. Nausea  and vomiting. Vision changes. Severe chest pain, dizziness, and seizures. How is this diagnosed? This condition is diagnosed by measuring your blood pressure while you are seated, with your arm resting on a flat surface, your legs uncrossed, and your feet flat on the floor. The cuff of the blood pressure monitor will be placed directly against the skin of your upper arm at the level of your heart. Blood pressure should be measured at least twice using the same arm. Certain conditions can cause a difference in blood pressure between your right and left arms. If you have a high blood pressure reading during one visit or you have normal blood pressure with other risk factors, you may be asked to: Return on a different day to have your blood pressure checked again. Monitor your blood pressure at home for 1 week or longer. If you are diagnosed with hypertension, you may have other blood or imaging tests to help your health care provider understand your overall risk for other conditions. How is this treated? This condition is treated by making healthy lifestyle changes, such as eating healthy foods, exercising more, and reducing your alcohol intake. You may be referred for counseling on a healthy diet and physical activity. Your health care provider may prescribe medicine if lifestyle changes are not enough to get your blood pressure under control and if: Your systolic blood pressure is above 130. Your diastolic blood pressure is above 80. Your personal target blood pressure may vary depending on your medical conditions, your age, and other factors. Follow these instructions at home: Eating and drinking  Eat a diet that is high in fiber and potassium, and low in sodium, added sugar, and fat. An example  of this eating plan is called the DASH diet. DASH stands for Dietary Approaches to Stop Hypertension. To eat this way: Eat plenty of fresh fruits and vegetables. Try to fill one half of your plate at each  meal with fruits and vegetables. Eat whole grains, such as whole-wheat pasta, brown rice, or whole-grain bread. Fill about one fourth of your plate with whole grains. Eat or drink low-fat dairy products, such as skim milk or low-fat yogurt. Avoid fatty cuts of meat, processed or cured meats, and poultry with skin. Fill about one fourth of your plate with lean proteins, such as fish, chicken without skin, beans, eggs, or tofu. Avoid pre-made and processed foods. These tend to be higher in sodium, added sugar, and fat. Reduce your daily sodium intake. Many people with hypertension should eat less than 1,500 mg of sodium a day. Do not drink alcohol if: Your health care provider tells you not to drink. You are pregnant, may be pregnant, or are planning to become pregnant. If you drink alcohol: Limit how much you have to: 0-1 drink a day for women. 0-2 drinks a day for men. Know how much alcohol is in your drink. In the [place] one drink equals one 12 oz bottle of beer (355 mL), one 5 oz glass of wine (148 mL), or one 1 oz glass of hard liquor (44 mL). Lifestyle  Work with your health care provider to maintain a healthy body weight or to lose weight. Ask what an ideal weight is for you. Get at least 30 minutes of exercise that causes your heart to beat faster (aerobic exercise) most days of the week. Activities may include walking, swimming, or biking. Include exercise to strengthen your muscles (resistance exercise), such as Pilates or lifting weights, as part of your weekly exercise routine. Try to do these types of exercises for 30 minutes at least 3 days a week. Do not use any products that contain nicotine or tobacco. These products include cigarettes, chewing tobacco, and vaping devices, such as e-cigarettes. If you need help quitting, ask your health care provider. Monitor your blood pressure at home as told by your health care provider. Keep all follow-up visits. This is  important. Medicines Take over-the-counter and prescription medicines only as told by your health care provider. Follow directions carefully. Blood pressure medicines must be taken as prescribed. Do not skip doses of blood pressure medicine. Doing this puts you at risk for problems and can make the medicine less effective. Ask your health care provider about side effects or reactions to medicines that you should watch for. Contact a health care provider if you: Think you are having a reaction to a medicine you are taking. Have headaches that keep coming back (recurring). Feel dizzy. Have swelling in your ankles. Have trouble with your vision. Get help right away if you: Develop a severe headache or confusion. Have unusual weakness or numbness. Feel faint. Have severe pain in your chest or abdomen. Vomit repeatedly. Have trouble breathing. These symptoms may be an emergency. Get help right away. Call 911. Do not wait to see if the symptoms will go away. Do not drive yourself to the hospital. Summary Hypertension is when the force of blood pumping through your arteries is too strong. If this condition is not controlled, it may put you at risk for serious complications. Your personal target blood pressure may vary depending on your medical conditions, your age, and other factors. For most people, a normal blood pressure is less  than 120/80. Hypertension is treated with lifestyle changes, medicines, or a combination of both. Lifestyle changes include losing weight, eating a healthy, low-sodium diet, exercising more, and limiting alcohol. This information is not intended to replace advice given to you by your health care provider. Make sure you discuss any questions you have with your health care provider. Document Revised: 04/06/2021 Document Reviewed: 04/06/2021 [employer]vier Patient Education  2024 [employer].

## 2023-02-14 NOTE — Assessment & Plan Note (Signed)
Stable.  Continue Symbicort twice a day and albuterol as needed

## 2023-02-14 NOTE — Assessment & Plan Note (Signed)
Diet and nutrition discussed.  Advised to decrease amount of daily carbohydrate intake and daily calories and increase amount of plant-based protein in her diet. 

## 2023-02-14 NOTE — Assessment & Plan Note (Signed)
Contributing to chronic lumbar pain Follows up with orthopedist on a regular basis Oxycodone as needed

## 2023-02-14 NOTE — Assessment & Plan Note (Signed)
Compliant with CPAP 

## 2023-02-14 NOTE — Assessment & Plan Note (Signed)
 Recommend dermatology evaluation Referral placed today

## 2023-02-14 NOTE — Progress Notes (Signed)
[name] 62 y.o.   Chief Complaint  Patient presents with   Ankle Pain    Left ankle X several months. Also discoloration since May.    HISTORY OF PRESENT ILLNESS: [name] complaining of tender left ankle with white skin discoloration for the past couple months Diabetic, insulin-dependent.  Also hypertensive Sees endocrinologist on a regular basis. Blood results from last July reviewed with patient.  Hemoglobin A1c is 7.8  Ankle Pain      Prior to Admission medications   Medication Sig Start Date End Date Taking? Authorizing Provider  acyclovir (ZOVIRAX) 400 MG tablet Take 1 tablet (400 mg total) by mouth 4 (four) times daily. 05/30/22  Yes Rahkim Rabalais, Eilleen Kempf, MD  albuterol (VENTOLIN HFA) 108 (90 Base) MCG/ACT inhaler Inhale 2 puffs into the lungs every 6 (six) hours as needed for wheezing or shortness of breath. 05/28/20  Yes Young, Joni Fears D, MD  Cholecalciferol (VITAMIN D3 PO) Take 1 tablet by mouth daily.   Yes [provider]  Continuous Glucose Monitor DEVI Use as directed. 03/28/17  Yes John Giovanni, MD  Continuous Glucose Sensor (FREESTYLE LIBRE 2 SENSOR) MISC CHANGE SENSOR EVERY 14 DAYS 11/28/22  Yes Shamleffer, Konrad Dolores, MD  Cyanocobalamin (B-12 PO) Take 1 tablet by mouth daily.   Yes [provider]  dapagliflozin propanediol (FARXIGA) 10 MG TABS tablet Take 1 tablet (10 mg total) by mouth daily. 09/06/22  Yes Shamleffer, Konrad Dolores, MD  diclofenac Sodium (VOLTAREN) 1 % GEL Apply 2 g topically 4 (four) times daily. Rub into affected area of foot 2 to 4 times daily Patient taking differently: Apply 2 g topically 4 (four) times daily as needed (pain). 01/14/22  Yes Vivi Barrack, DPM  dicyclomine (BENTYL) 10 MG capsule TAKE 1 CAPSULE BY MOUTH THREE TIMES DAILY BEFORE MEAL(S) 02/04/22  Yes Esterwood, Amy S, PA-C  esomeprazole (NEXIUM) 40 MG capsule Take 1 capsule (40 mg total) by mouth daily. Take 30 minutes before  breakfast. 01/31/23  Yes Meryl Dare, MD  ezetimibe (ZETIA) 10 MG tablet Take 1 tablet (10 mg total) by mouth daily. 09/06/22 09/06/23 Yes Shamleffer, Konrad Dolores, MD  famotidine (PEPCID) 20 MG tablet Take 1 tablet (20 mg total) by mouth 2 (two) times daily as needed. 02/04/22  Yes Esterwood, Amy S, PA-C  glucose blood (FREESTYLE TEST STRIPS) test strip 1 each by Other route 3 (three) times daily. 09/06/21  Yes Shamleffer, Konrad Dolores, MD  Insulin Disposable Pump (OMNIPOD DASH PODS, GEN 4,) MISC USE ONE POD EVERY ONE AND ONE-HALF DAYS. 10/21/22  Yes Shamleffer, Konrad Dolores, MD  letrozole [facility]) 2.5 MG tablet Take 1 tablet by mouth once daily 06/20/22  Yes Serena Croissant, MD  lidocaine (LIDODERM) 5 % Place 1 patch onto the skin daily. Remove & Discard patch within 12 hours or as directed by MD 10/22/22  Yes Wynetta Fines, MD  metFORMIN (GLUCOPHAGE) 1000 MG tablet TAKE 1 TABLET BY MOUTH TWICE DAILY WITH A MEAL. 11/29/22  Yes Shamleffer, Konrad Dolores, MD  Misc Natural Products (ELDERBERRY IMMUNE COMPLEX) CHEW Chew 1 tablet by mouth daily.   Yes [provider]  montelukast (SINGULAIR) 10 MG tablet TAKE 1 TABLET BY MOUTH AT BEDTIME 11/29/22  Yes Glenford Bayley, NP  Multiple Vitamin (MULTIVITAMIN WITH MINERALS) TABS tablet Take 1 tablet by mouth daily.   Yes [provider]  NARCAN 4 MG/0.1ML LIQD nasal spray kit Place 0.4 mg into the nose once. 12/06/18  Yes [provider]  NOVOLOG 100 UNIT/ML injection MAX DAILY 120 UNITS  DX E11.65 05/25/22  Yes Shamleffer, Konrad Dolores, MD  oxyCODONE-acetaminophen (PERCOCET) 10-325 MG tablet Take 1 tablet by mouth every 8 (eight) hours as needed for pain. 03/02/20  Yes [provider]  pregabalin (LYRICA) 75 MG capsule Take 75 mg by mouth 2 (two) times daily.   Yes [provider]  rosuvastatin (CRESTOR) 10 MG tablet Take 1 tablet (10 mg total) by mouth daily. 09/06/22  Yes Shamleffer, Konrad Dolores, MD   Semaglutide, 2 MG/DOSE, (OZEMPIC, 2 MG/DOSE,) 8 MG/3ML SOPN Inject 2 mg into the skin once a week. 09/06/22  Yes Shamleffer, Konrad Dolores, MD  SYMBICORT 160-4.5 MCG/ACT inhaler Inhale 2 puffs by mouth twice daily 07/29/22  Yes Young, Clinton D, MD  tiZANidine (ZANAFLEX) 4 MG capsule Take 4 mg by mouth 3 (three) times daily as needed for muscle spasms. 07/23/21  Yes [provider]  valACYclovir (VALTREX) 500 MG tablet TAKE 1 TABLET BY MOUTH TWICE DAILY FOR 5 DAYS AT  TIME  OF  OUTBREAK 12/22/22  Yes Jen Eppinger, Eilleen Kempf, MD  valsartan (DIOVAN) 80 MG tablet Take 1 tablet (80 mg total) by mouth daily. 09/28/22  Yes Dee Maday, Eilleen Kempf, MD    Allergies  Allergen Reactions   Earlie Server [Fluticasone Furoate-Vilanterol] Other (See Comments)    Urinary retention   Hydrochlorothiazide     Hypokalemia    Lisinopril Cough    Patient Active Problem List   Diagnosis Date Noted   Lumbar degenerative disc disease 10/27/2022   Spinal stenosis of lumbar region without neurogenic claudication 10/27/2022   Osteoarthritis of spine with radiculopathy, lumbar region 10/27/2022   Neck pain on left side 09/28/2022   Soft tissue mass 09/28/2022   Personal history of colonic polyps 05/19/2022   Benign neoplasm of ascending colon 05/19/2022   Benign neoplasm of transverse colon 05/19/2022   Malignant neoplasm of upper-outer quadrant of right breast in female, estrogen receptor positive (HCC) 02/22/2021   Body mass index (BMI) 40.0-44.9, adult (HCC) 07/28/2020   Lumbago with sciatica, right side 07/28/2020   OSA on CPAP 10/31/2018   Stress incontinence in female 03/09/2017   Hepatic steatosis 08/29/2016   Herpes simplex 03/08/2016   Diabetic neuropathy with neurologic complication (HCC) 10/20/2015   Long term current use of opiate analgesic 07/21/2015   COPD GOLD II  05/31/2013   Lung nodule < 6cm on CT 05/31/2013   Chronic cough 12/07/2012   Chronic pain syndrome 02/13/2012   Lumbar  radiculopathy 11/29/2011   Type 2 diabetes mellitus with diabetic neuropathy (HCC) 08/25/2011   Back pain 11/15/2010   Hypertension associated with diabetes (HCC) 07/07/2009   GERD 01/08/2009   Dyslipidemia associated with type 2 diabetes mellitus (HCC) 12/09/2008   Hyperlipidemia 12/09/2008   Morbid obesity due to excess calories (HCC) 12/09/2008    Past Medical History:  Diagnosis Date   Allergy    Arthritis    back    Asthma    AS CHILD   Cancer (HCC)    breast CA- Right,   Chronic back pain    Chronic leg pain    due to back pain   Cocaine abuse (HCC)    in remission   COPD (chronic obstructive pulmonary disease) (HCC)    Diabetes (HCC)    with neuropathy   GERD (gastroesophageal reflux disease)    Hyperlipidemia    Hypertension    Internal hemorrhoids    Intraductal papilloma of  left breast 02/18/2016   Neuromuscular disorder (HCC)    neuropathy   Personal history of radiation therapy    Postlaminectomy syndrome of lumbar region 12/07/2011   RECTAL BLEEDING 12/09/2008   Annotation: s/p EGD 7/08 mild gastritis, s/p colonoscopy 7/08- benign polyp  s/p polypectomy and isolated diverticulum.  Qualifier: Diagnosis of  By: Ditzler RN, Debra     Sciatica    per patient    Sleep apnea    CPAP    YEARS AGO DONE 1/2 YEARS AGO AND WAS TOLD DID NOT HAVE   Tobacco abuse    Tubular adenoma of colon    Uterine fibroid    s/p hysterectomy    Past Surgical History:  Procedure Laterality Date   BACK SURGERY  2012   L5-S1 microendoscopic disectomy last surgery 06/2011   BREAST BIOPSY     LEFT    01/19/16   BREAST BIOPSY Right 02/16/2021   BREAST EXCISIONAL BIOPSY Left 02/2016   BREAST LUMPECTOMY Right 03/12/2021   BREAST LUMPECTOMY WITH RADIOACTIVE SEED AND SENTINEL LYMPH NODE BIOPSY Right 03/12/2021   Procedure: RIGHT BREAST LUMPECTOMY WITH RADIOACTIVE SEED AND SENTINEL LYMPH NODE BIOPSY;  Surgeon: Griselda Miner, MD;  Location: [facility] OR;  Service: General;  Laterality: Right;    BREAST REDUCTION SURGERY  1982   CATARACT EXTRACTION Right 11/07/2019   COLONOSCOPY     COLONOSCOPY WITH PROPOFOL N/A 05/19/2022   Procedure: COLONOSCOPY WITH PROPOFOL;  Surgeon: Meryl Dare, MD;  Location: [facility];  Service: Gastroenterology;  Laterality: N/A;   HAND SURGERY     MIDDLE TRIGGER FINGER RIGHT SIDE   POLYPECTOMY  05/19/2022   Procedure: POLYPECTOMY;  Surgeon: Meryl Dare, MD;  Location: [facility];  Service: Gastroenterology;;   RADIOACTIVE SEED GUIDED EXCISIONAL BREAST BIOPSY Left 02/18/2016   Procedure: LEFT RADIOACTIVE SEED GUIDED EXCISIONAL BREAST BIOPSY;  Surgeon: Ovidio Kin, MD;  Location: [facility] OR;  Service: General;  Laterality: Left;   REDUCTION MAMMAPLASTY Bilateral    TONSILLECTOMY  2008   TOTAL ABDOMINAL HYSTERECTOMY  05/24/2007   hysterectomy   TUBAL LIGATION     UPPER GASTROINTESTINAL ENDOSCOPY      Social History   Socioeconomic History   Marital status: Married    Spouse name: Not on file   Number of children: 3   Years of education: Not on file   Highest education level: Some college, no degree  Occupational History    Comment: [employer]  Tobacco Use   Smoking status: Former    Current packs/day: 0.00    Average packs/day: 0.5 packs/day for 20.0 years (10.0 ttl pk-yrs)    Types: Cigarettes    Start date: 07/23/1976    Quit date: 07/23/1996    Years since quitting: 26.5   Smokeless tobacco: Never  Vaping Use   Vaping status: Never Used  Substance and Sexual Activity   Alcohol use: Not Currently    Comment: recovering addict clean for 9 years   Drug use: Not Currently    Types: Cocaine, Marijuana    Comment: recovering addict clean for 16 years   Sexual activity: Yes    Birth control/protection: Surgical  Other Topics Concern   Not on file  Social History Narrative   Current Social History 01/17/2020        Patient lives with spouse in a home which is 1 story. There are not steps up to the entrance the patient  uses.       Patient's method of  transportation is personal car.      The highest level of education was some college.      The patient currently works part-time.      Identified important Relationships are God, husband, kids, grandkids       Pets : None       Interests / Fun: Drawing and nature       Current Stressors: Pain, Covid       Religious / Personal Beliefs: Christian       Other: None    Social Determinants of Health   Financial Resource Strain: Low Risk  (02/13/2023)   Overall Financial Resource Strain (CARDIA)    Difficulty of Paying Living Expenses: Not very hard  Food Insecurity: No Food Insecurity (02/13/2023)   Hunger Vital Sign    Worried About Running Out of Food in the Last Year: Never true    Ran Out of Food in the Last Year: Never true  Transportation Needs: No Transportation Needs (02/13/2023)   PRAPARE - Administrator, Civil Service (Medical): No    Lack of Transportation (Non-Medical): No  Physical Activity: Insufficiently Active (02/13/2023)   Exercise Vital Sign    Days of Exercise per Week: 2 days    Minutes of Exercise per Session: 60 min  Stress: No Stress Concern Present (02/13/2023)   [employer] of Occupational Health - Occupational Stress Questionnaire    Feeling of Stress : Not at all  Social Connections: Socially Integrated (02/13/2023)   Social Connection and Isolation Panel [NHANES]    Frequency of Communication with Friends and Family: More than three times a week    Frequency of Social Gatherings with Friends and Family: Once a week    Attends Religious Services: More than 4 times per year    Active Member of [employer] or Organizations: Yes    Attends Engineer, structural: More than 4 times per year    Marital Status: Married  Catering manager Violence: Not At Risk (02/18/2022)   Humiliation, Afraid, Rape, and Kick questionnaire    Fear of Current or Ex-Partner: No    Emotionally Abused: No    Physically Abused: No     Sexually Abused: No    Family History  Problem Relation Age of Onset   Diabetes Father    Heart disease Father    Hypertension Father    Diabetes Mother    Cancer Mother        brain   Hypertension Mother    Kidney disease Sister    Diabetes Sister    Kidney cancer Sister    Stroke Sister    Diabetes Sister    Hypertension Sister    Diabetes Sister    Hypertension Sister    Obesity Son    Colon cancer Neg Hx    Colon polyps Neg Hx    Esophageal cancer Neg Hx    Rectal cancer Neg Hx    Stomach cancer Neg Hx      Review of Systems  Constitutional: Negative.  Negative for chills and fever.  HENT: Negative.  Negative for congestion and sore throat.   Respiratory: Negative.  Negative for cough and shortness of breath.   Cardiovascular: Negative.  Negative for chest pain and palpitations.  Gastrointestinal:  Negative for abdominal pain, diarrhea, nausea and vomiting.  Genitourinary: Negative.  Negative for dysuria and hematuria.  Skin: Negative.        Skin discoloration left ankle area  Neurological: Negative.  Negative for dizziness and headaches.  All other systems reviewed and are negative.   Today's Vitals   02/14/23 0944 02/14/23 1015  BP: (!) 154/68 (!) 150/70  Pulse: 86   Resp: 16   Temp: 99.9 F (37.7 C)   SpO2: 98%   Weight: 248 lb 4 oz (112.6 kg)   Height: 5\' 4"  (1.626 m)    Body mass index is 42.61 kg/m.   Physical Exam Vitals reviewed.  Constitutional:      Appearance: Normal appearance. She is obese.  HENT:     Head: Normocephalic.  Eyes:     Extraocular Movements: Extraocular movements intact.     Pupils: Pupils are equal, round, and reactive to light.  Cardiovascular:     Rate and Rhythm: Normal rate and regular rhythm.     Pulses: Normal pulses.     Heart sounds: Normal heart sounds.  Pulmonary:     Effort: Pulmonary effort is normal.     Breath sounds: Normal breath sounds.  Musculoskeletal:     Comments: Left ankle: Full range of  motion.  Nontender  Skin:    General: Skin is warm and dry.     Comments: Left lateral ankle: Whitish discoloration Nontender moderate-sized sebaceous cyst to posterior neck  Neurological:     Mental Status: She is alert and oriented to person, place, and time.  Psychiatric:        Mood and Affect: Mood normal.        Behavior: Behavior normal.      ASSESSMENT & PLAN: A total of 46 minutes was spent with the patient and counseling/coordination of care regarding preparing for this visit, review of most recent office visit notes, review of multiple chronic medical conditions under management, review of most recent blood work results, review of all medications, cardiovascular risks associated with uncontrolled hypertension and diabetes, education on nutrition, prognosis, documentation and need for follow-up.  Problem List Items Addressed This Visit       Cardiovascular and Mediastinum   Hypertension associated with diabetes (HCC) - Primary    BP Readings from Last 3 Encounters:  02/14/23 (!) 150/70  01/31/23 (!) 159/90  12/12/22 (!) 142/76  Uncontrolled hypertension. Recommend to increase valsartan to 160 mg daily New prescription sent to pharmacy of record Uncontrolled diabetes with hemoglobin A1c of 7.8. Cardiovascular risks associated with uncontrolled hypertension and uncontrolled diabetes discussed Diet and nutrition discussed Recently taken off Ozempic due to GI side effects Continues insulin pump, Farxiga 10 mg daily, and metformin 1000 mg twice a day Has follow-up appointment with endocrinologist later this month       Relevant Medications   valsartan (DIOVAN) 160 MG tablet     Respiratory   COPD GOLD II     Stable.  Continue Symbicort twice a day and albuterol as needed      OSA on CPAP    Compliant with CPAP        Digestive   GERD (Chronic)    Stable and well-controlled. Sees GI on a regular basis        Endocrine   Diabetic neuropathy with  neurologic complication (HCC) (Chronic)    Continues pregabalin 75 mg twice a day      Relevant Medications   valsartan (DIOVAN) 160 MG tablet   Dyslipidemia associated with type 2 diabetes mellitus (HCC)    Uncontrolled diabetes with hemoglobin A1c at 7.8 Continues insulin, Farxiga, metformin Recently taken off Ozempic Continues rosuvastatin 10 mg daily Diet and  nutrition discussed      Relevant Medications   valsartan (DIOVAN) 160 MG tablet     Nervous and Auditory   Osteoarthritis of spine with radiculopathy, lumbar region    Contributing to chronic lumbar pain Follows up with orthopedist on a regular basis Oxycodone as needed        Other   Morbid obesity due to excess calories (HCC)    Diet and nutrition discussed Advised to decrease amount of daily carbohydrate intake and daily calories and increase amount of plant-based protein in her diet      Chronic pain syndrome   Discoloration of skin    Recommend dermatology evaluation Referral placed today      Relevant Orders   DG Ankle Complete Left   Ambulatory referral to Dermatology   Chronic pain of left ankle    X-ray done today.  Report reviewed. Differential diagnosis discussed.      Relevant Orders   DG Ankle Complete Left   Other Visit Diagnoses     Sebaceous cyst       Relevant Orders   Ambulatory referral to Dermatology      Patient Instructions  Increase dose of valsartan to 160 mg daily.  New prescription sent to pharmacy of record today. Continue all other medications  Hypertension, Adult High blood pressure (hypertension) is when the force of blood pumping through the arteries is too strong. The arteries are the blood vessels that carry blood from the heart throughout the body. Hypertension forces the heart to work harder to pump blood and may cause arteries to become narrow or stiff. Untreated or uncontrolled hypertension can lead to a heart attack, heart failure, a stroke, kidney disease,  and other problems. A blood pressure reading consists of a higher number over a lower number. Ideally, your blood pressure should be below 120/80. The first ("top") number is called the systolic pressure. It is a measure of the pressure in your arteries as your heart beats. The second ("bottom") number is called the diastolic pressure. It is a measure of the pressure in your arteries as the heart relaxes. What are the causes? The exact cause of this condition is not known. There are some conditions that result in high blood pressure. What increases the risk? Certain factors may make you more likely to develop high blood pressure. Some of these risk factors are under your control, including: Smoking. Not getting enough exercise or physical activity. Being overweight. Having too much fat, sugar, calories, or salt (sodium) in your diet. Drinking too much alcohol. Other risk factors include: Having a personal history of heart disease, diabetes, high cholesterol, or kidney disease. Stress. Having a family history of high blood pressure and high cholesterol. Having obstructive sleep apnea. Age. The risk increases with age. What are the signs or symptoms? High blood pressure may not cause symptoms. Very high blood pressure (hypertensive crisis) may cause: Headache. Fast or irregular heartbeats (palpitations). Shortness of breath. Nosebleed. Nausea and vomiting. Vision changes. Severe chest pain, dizziness, and seizures. How is this diagnosed? This condition is diagnosed by measuring your blood pressure while you are seated, with your arm resting on a flat surface, your legs uncrossed, and your feet flat on the floor. The cuff of the blood pressure monitor will be placed directly against the skin of your upper arm at the level of your heart. Blood pressure should be measured at least twice using the same arm. Certain conditions can cause a difference in blood pressure between  your right and left  arms. If you have a high blood pressure reading during one visit or you have normal blood pressure with other risk factors, you may be asked to: Return on a different day to have your blood pressure checked again. Monitor your blood pressure at home for 1 week or longer. If you are diagnosed with hypertension, you may have other blood or imaging tests to help your health care provider understand your overall risk for other conditions. How is this treated? This condition is treated by making healthy lifestyle changes, such as eating healthy foods, exercising more, and reducing your alcohol intake. You may be referred for counseling on a healthy diet and physical activity. Your health care provider may prescribe medicine if lifestyle changes are not enough to get your blood pressure under control and if: Your systolic blood pressure is above 130. Your diastolic blood pressure is above 80. Your personal target blood pressure may vary depending on your medical conditions, your age, and other factors. Follow these instructions at home: Eating and drinking  Eat a diet that is high in fiber and potassium, and low in sodium, added sugar, and fat. An example of this eating plan is called the DASH diet. DASH stands for Dietary Approaches to Stop Hypertension. To eat this way: Eat plenty of fresh fruits and vegetables. Try to fill one half of your plate at each meal with fruits and vegetables. Eat whole grains, such as whole-wheat pasta, brown rice, or whole-grain bread. Fill about one fourth of your plate with whole grains. Eat or drink low-fat dairy products, such as skim milk or low-fat yogurt. Avoid fatty cuts of meat, processed or cured meats, and poultry with skin. Fill about one fourth of your plate with lean proteins, such as fish, chicken without skin, beans, eggs, or tofu. Avoid pre-made and processed foods. These tend to be higher in sodium, added sugar, and fat. Reduce your daily sodium intake.  Many people with hypertension should eat less than 1,500 mg of sodium a day. Do not drink alcohol if: Your health care provider tells you not to drink. You are pregnant, may be pregnant, or are planning to become pregnant. If you drink alcohol: Limit how much you have to: 0-1 drink a day for women. 0-2 drinks a day for men. Know how much alcohol is in your drink. In the [place] one drink equals one 12 oz bottle of beer (355 mL), one 5 oz glass of wine (148 mL), or one 1 oz glass of hard liquor (44 mL). Lifestyle  Work with your health care provider to maintain a healthy body weight or to lose weight. Ask what an ideal weight is for you. Get at least 30 minutes of exercise that causes your heart to beat faster (aerobic exercise) most days of the week. Activities may include walking, swimming, or biking. Include exercise to strengthen your muscles (resistance exercise), such as Pilates or lifting weights, as part of your weekly exercise routine. Try to do these types of exercises for 30 minutes at least 3 days a week. Do not use any products that contain nicotine or tobacco. These products include cigarettes, chewing tobacco, and vaping devices, such as e-cigarettes. If you need help quitting, ask your health care provider. Monitor your blood pressure at home as told by your health care provider. Keep all follow-up visits. This is important. Medicines Take over-the-counter and prescription medicines only as told by your health care provider. Follow directions carefully. Blood pressure medicines  must be taken as prescribed. Do not skip doses of blood pressure medicine. Doing this puts you at risk for problems and can make the medicine less effective. Ask your health care provider about side effects or reactions to medicines that you should watch for. Contact a health care provider if you: Think you are having a reaction to a medicine you are taking. Have headaches that keep coming back  (recurring). Feel dizzy. Have swelling in your ankles. Have trouble with your vision. Get help right away if you: Develop a severe headache or confusion. Have unusual weakness or numbness. Feel faint. Have severe pain in your chest or abdomen. Vomit repeatedly. Have trouble breathing. These symptoms may be an emergency. Get help right away. Call 911. Do not wait to see if the symptoms will go away. Do not drive yourself to the hospital. Summary Hypertension is when the force of blood pumping through your arteries is too strong. If this condition is not controlled, it may put you at risk for serious complications. Your personal target blood pressure may vary depending on your medical conditions, your age, and other factors. For most people, a normal blood pressure is less than 120/80. Hypertension is treated with lifestyle changes, medicines, or a combination of both. Lifestyle changes include losing weight, eating a healthy, low-sodium diet, exercising more, and limiting alcohol. This information is not intended to replace advice given to you by your health care provider. Make sure you discuss any questions you have with your health care provider. Document Revised: 04/06/2021 Document Reviewed: 04/06/2021 Elsevier Patient Education  2024 Elsevier Inc.     Edwina Barth, MD [facility]

## 2023-02-14 NOTE — Assessment & Plan Note (Signed)
X-ray done today.  Report reviewed. Differential diagnosis discussed.

## 2023-02-14 NOTE — Assessment & Plan Note (Signed)
Stable and well-controlled. Sees GI on a regular basis

## 2023-02-16 ENCOUNTER — Encounter: Payer: Self-pay | Admitting: Gastroenterology

## 2023-02-16 DIAGNOSIS — M79604 Pain in right leg: Secondary | ICD-10-CM | POA: Diagnosis not present

## 2023-02-16 DIAGNOSIS — R262 Difficulty in walking, not elsewhere classified: Secondary | ICD-10-CM | POA: Diagnosis not present

## 2023-02-16 DIAGNOSIS — M79605 Pain in left leg: Secondary | ICD-10-CM | POA: Diagnosis not present

## 2023-02-16 DIAGNOSIS — M545 Low back pain, unspecified: Secondary | ICD-10-CM | POA: Diagnosis not present

## 2023-02-21 DIAGNOSIS — M79605 Pain in left leg: Secondary | ICD-10-CM | POA: Diagnosis not present

## 2023-02-21 DIAGNOSIS — M79604 Pain in right leg: Secondary | ICD-10-CM | POA: Diagnosis not present

## 2023-02-21 DIAGNOSIS — M545 Low back pain, unspecified: Secondary | ICD-10-CM | POA: Diagnosis not present

## 2023-02-21 DIAGNOSIS — R262 Difficulty in walking, not elsewhere classified: Secondary | ICD-10-CM | POA: Diagnosis not present

## 2023-02-22 ENCOUNTER — Other Ambulatory Visit: Payer: Self-pay | Admitting: Internal Medicine

## 2023-02-23 DIAGNOSIS — R262 Difficulty in walking, not elsewhere classified: Secondary | ICD-10-CM | POA: Diagnosis not present

## 2023-02-23 DIAGNOSIS — M545 Low back pain, unspecified: Secondary | ICD-10-CM | POA: Diagnosis not present

## 2023-02-23 DIAGNOSIS — M79604 Pain in right leg: Secondary | ICD-10-CM | POA: Diagnosis not present

## 2023-02-23 DIAGNOSIS — M79605 Pain in left leg: Secondary | ICD-10-CM | POA: Diagnosis not present

## 2023-02-28 DIAGNOSIS — M79605 Pain in left leg: Secondary | ICD-10-CM | POA: Diagnosis not present

## 2023-02-28 DIAGNOSIS — M79604 Pain in right leg: Secondary | ICD-10-CM | POA: Diagnosis not present

## 2023-02-28 DIAGNOSIS — R262 Difficulty in walking, not elsewhere classified: Secondary | ICD-10-CM | POA: Diagnosis not present

## 2023-02-28 DIAGNOSIS — M545 Low back pain, unspecified: Secondary | ICD-10-CM | POA: Diagnosis not present

## 2023-03-01 ENCOUNTER — Other Ambulatory Visit: Payer: Self-pay | Admitting: Physician Assistant

## 2023-03-01 ENCOUNTER — Other Ambulatory Visit: Payer: Self-pay | Admitting: Primary Care

## 2023-03-02 DIAGNOSIS — M545 Low back pain, unspecified: Secondary | ICD-10-CM | POA: Diagnosis not present

## 2023-03-02 DIAGNOSIS — M79604 Pain in right leg: Secondary | ICD-10-CM | POA: Diagnosis not present

## 2023-03-02 DIAGNOSIS — R262 Difficulty in walking, not elsewhere classified: Secondary | ICD-10-CM | POA: Diagnosis not present

## 2023-03-02 DIAGNOSIS — M79605 Pain in left leg: Secondary | ICD-10-CM | POA: Diagnosis not present

## 2023-03-03 ENCOUNTER — Encounter: Payer: Self-pay | Admitting: Internal Medicine

## 2023-03-03 ENCOUNTER — Ambulatory Visit (INDEPENDENT_AMBULATORY_CARE_PROVIDER_SITE_OTHER): Payer: PPO | Admitting: Internal Medicine

## 2023-03-03 VITALS — BP 170/90 | HR 73 | Temp 98.3°F | Ht 64.0 in | Wt 249.4 lb

## 2023-03-03 DIAGNOSIS — E1159 Type 2 diabetes mellitus with other circulatory complications: Secondary | ICD-10-CM | POA: Diagnosis not present

## 2023-03-03 DIAGNOSIS — Z7984 Long term (current) use of oral hypoglycemic drugs: Secondary | ICD-10-CM | POA: Diagnosis not present

## 2023-03-03 DIAGNOSIS — B3731 Acute candidiasis of vulva and vagina: Secondary | ICD-10-CM | POA: Diagnosis not present

## 2023-03-03 DIAGNOSIS — R768 Other specified abnormal immunological findings in serum: Secondary | ICD-10-CM | POA: Diagnosis not present

## 2023-03-03 DIAGNOSIS — E1140 Type 2 diabetes mellitus with diabetic neuropathy, unspecified: Secondary | ICD-10-CM | POA: Diagnosis not present

## 2023-03-03 DIAGNOSIS — Z79899 Other long term (current) drug therapy: Secondary | ICD-10-CM | POA: Diagnosis not present

## 2023-03-03 DIAGNOSIS — R300 Dysuria: Secondary | ICD-10-CM | POA: Diagnosis not present

## 2023-03-03 DIAGNOSIS — I10 Essential (primary) hypertension: Secondary | ICD-10-CM | POA: Diagnosis not present

## 2023-03-03 DIAGNOSIS — E119 Type 2 diabetes mellitus without complications: Secondary | ICD-10-CM | POA: Diagnosis not present

## 2023-03-03 DIAGNOSIS — I152 Hypertension secondary to endocrine disorders: Secondary | ICD-10-CM

## 2023-03-03 DIAGNOSIS — M5136 Other intervertebral disc degeneration, lumbar region: Secondary | ICD-10-CM | POA: Diagnosis not present

## 2023-03-03 DIAGNOSIS — E559 Vitamin D deficiency, unspecified: Secondary | ICD-10-CM | POA: Diagnosis not present

## 2023-03-03 LAB — URINALYSIS, ROUTINE W REFLEX MICROSCOPIC
Bilirubin Urine: NEGATIVE
Hgb urine dipstick: NEGATIVE
Ketones, ur: NEGATIVE
Nitrite: NEGATIVE
Specific Gravity, Urine: 1.01 (ref 1.000–1.030)
Total Protein, Urine: NEGATIVE
Urine Glucose: >=1000 — AB
Urobilinogen, UA: 0.2 (ref 0.0–1.0)
pH: 6.5 (ref 5.0–8.0)

## 2023-03-03 MED ORDER — FLUCONAZOLE 150 MG PO TABS: ORAL_TABLET | ORAL | 1 refills | Status: DC

## 2023-03-03 MED ORDER — VALSARTAN 320 MG PO TABS: 320.0000 mg | ORAL_TABLET | Freq: Every day | ORAL | 3 refills | Status: DC

## 2023-03-03 NOTE — Patient Instructions (Addendum)
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  Please have the pharmacy call with any other refills you may need.  Please continue your efforts at being more active, low cholesterol diet, and weight control.  Please keep your appointments with your specialists as you may have planned  Please go to the LAB at the blood drawing area for the tests to be done - just the urine testing today  You will be contacted by phone if any changes need to be made immediately.  Otherwise, you will receive a letter about your results with an explanation, but please check with MyChart first.  Please see Dr Alvy Bimler for blood pressure in 1 month

## 2023-03-03 NOTE — Progress Notes (Unsigned)
Patient ID: [name], female   DOB: 09-01-1960, 62 y.o.   MRN: [id number]        Chief Complaint: follow up dysuria, yeast infection symptoms, htn       HPI:  [name] is a 62 y.o. female here with c/o 3 days onset dysuria but Denies urinary symptoms such as frequency, urgency, flank pain, hematuria or n/v, fever, chills.  BP has been increased it seems since stopped hct due to higher risk of fal per pt.  Had to stop ozempic due to GI upset, but wt increasing again, willing to consider mounjaro instead  - has f/u with endo soon.  BP has been elevated at home as well.  Has vaginal d/c yeast like for over a week.   Wt Readings from Last 3 Encounters:  03/03/23 249 lb 6.4 oz (113.1 kg)  02/14/23 248 lb 4 oz (112.6 kg)  01/31/23 243 lb (110.2 kg)   BP Readings from Last 3 Encounters:  03/03/23 (!) 170/90  02/14/23 (!) 150/70  01/31/23 (!) 159/90         Past Medical History:  Diagnosis Date   Allergy    Arthritis    back    Asthma    AS CHILD   Cancer (HCC)    breast CA- Right,   Chronic back pain    Chronic leg pain    due to back pain   Cocaine abuse (HCC)    in remission   COPD (chronic obstructive pulmonary disease) (HCC)    Diabetes (HCC)    with neuropathy   GERD (gastroesophageal reflux disease)    Hyperlipidemia    Hypertension    Internal hemorrhoids    Intraductal papilloma of left breast 02/18/2016   Neuromuscular disorder (HCC)    neuropathy   Personal history of radiation therapy    Postlaminectomy syndrome of lumbar region 12/07/2011   RECTAL BLEEDING 12/09/2008   Annotation: s/p EGD 7/08 mild gastritis, s/p colonoscopy 7/08- benign polyp  s/p polypectomy and isolated diverticulum.  Qualifier: Diagnosis of  By: Ditzler RN, Debra     Sciatica    per patient    Sleep apnea    CPAP    YEARS AGO DONE 1/2 YEARS AGO AND WAS TOLD DID NOT HAVE   Tobacco abuse    Tubular adenoma of colon    Uterine fibroid    s/p hysterectomy   Past Surgical History:   Procedure Laterality Date   BACK SURGERY  2012   L5-S1 microendoscopic disectomy last surgery 06/2011   BREAST BIOPSY     LEFT    01/19/16   BREAST BIOPSY Right 02/16/2021   BREAST EXCISIONAL BIOPSY Left 02/2016   BREAST LUMPECTOMY Right 03/12/2021   BREAST LUMPECTOMY WITH RADIOACTIVE SEED AND SENTINEL LYMPH NODE BIOPSY Right 03/12/2021   Procedure: RIGHT BREAST LUMPECTOMY WITH RADIOACTIVE SEED AND SENTINEL LYMPH NODE BIOPSY;  Surgeon: Griselda Miner, MD;  Location: [facility] OR;  Service: General;  Laterality: Right;   BREAST REDUCTION SURGERY  1982   CATARACT EXTRACTION Right 11/07/2019   COLONOSCOPY     COLONOSCOPY WITH PROPOFOL N/A 05/19/2022   Procedure: COLONOSCOPY WITH PROPOFOL;  Surgeon: Meryl Dare, MD;  Location: [facility];  Service: Gastroenterology;  Laterality: N/A;   HAND SURGERY     MIDDLE TRIGGER FINGER RIGHT SIDE   POLYPECTOMY  05/19/2022   Procedure: POLYPECTOMY;  Surgeon: Meryl Dare, MD;  Location: [facility];  Service: Gastroenterology;;   RADIOACTIVE SEED GUIDED  EXCISIONAL BREAST BIOPSY Left 02/18/2016   Procedure: LEFT RADIOACTIVE SEED GUIDED EXCISIONAL BREAST BIOPSY;  Surgeon: Ovidio Kin, MD;  Location: [facility] OR;  Service: General;  Laterality: Left;   REDUCTION MAMMAPLASTY Bilateral    TONSILLECTOMY  2008   TOTAL ABDOMINAL HYSTERECTOMY  05/24/2007   hysterectomy   TUBAL LIGATION     UPPER GASTROINTESTINAL ENDOSCOPY      reports that she quit smoking about 26 years ago. Her smoking use included cigarettes. She started smoking about 46 years ago. She has a 10 pack-year smoking history. She has never used smokeless tobacco. She reports that she does not currently use alcohol. She reports that she does not currently use drugs after having used the following drugs: Cocaine and Marijuana. family history includes Cancer in her mother; Diabetes in her father, mother, sister, sister, and sister; Heart disease in her father; Hypertension in her father, mother, sister,  and sister; Kidney cancer in her sister; Kidney disease in her sister; Obesity in her son; Stroke in her sister. Allergies  Allergen Reactions   Breo Ellipta [Fluticasone Furoate-Vilanterol] Other (See Comments)    Urinary retention   Hydrochlorothiazide     Hypokalemia    Lisinopril Cough   Current Outpatient Medications on File Prior to Visit  Medication Sig Dispense Refill   acyclovir (ZOVIRAX) 400 MG tablet Take 1 tablet (400 mg total) by mouth 4 (four) times daily. 90 tablet 0   albuterol (VENTOLIN HFA) 108 (90 Base) MCG/ACT inhaler Inhale 2 puffs into the lungs every 6 (six) hours as needed for wheezing or shortness of breath. 18 g 12   Cholecalciferol (VITAMIN D3 PO) Take 1 tablet by mouth daily.     Continuous Glucose Monitor DEVI Use as directed. 1 each 0   Continuous Glucose Sensor (FREESTYLE LIBRE 2 SENSOR) MISC CHANGE SENSOR EVERY 14 DAYS 2 each 3   Cyanocobalamin (B-12 PO) Take 1 tablet by mouth daily.     dapagliflozin propanediol (FARXIGA) 10 MG TABS tablet Take 1 tablet (10 mg total) by mouth daily. 90 tablet 3   diclofenac Sodium (VOLTAREN) 1 % GEL Apply 2 g topically 4 (four) times daily. Rub into affected area of foot 2 to 4 times daily (Patient taking differently: Apply 2 g topically 4 (four) times daily as needed (pain).) 100 g 2   dicyclomine (BENTYL) 10 MG capsule TAKE 1 CAPSULE BY MOUTH THREE TIMES DAILY BEFORE MEAL(S) 90 capsule 11   esomeprazole (NEXIUM) 40 MG capsule Take 1 capsule (40 mg total) by mouth daily. Take 30 minutes before breakfast. 30 capsule 0   ezetimibe (ZETIA) 10 MG tablet Take 1 tablet (10 mg total) by mouth daily. 30 tablet 11   famotidine (PEPCID) 20 MG tablet Take 1 tablet (20 mg total) by mouth 2 (two) times daily as needed. 60 tablet 11   glucose blood (FREESTYLE TEST STRIPS) test strip 1 each by Other route 3 (three) times daily. 300 each 3   Insulin Disposable Pump (OMNIPOD DASH PODS, GEN 4,) MISC USE ONE POD EVERY ONE AND ONE-HALF DAYS. 45  each 3   letrozole (FEMARA) 2.5 MG tablet Take 1 tablet by mouth once daily 90 tablet 3   lidocaine (LIDODERM) 5 % Place 1 patch onto the skin daily. Remove & Discard patch within 12 hours or as directed by MD 30 patch 0   metFORMIN (GLUCOPHAGE) 1000 MG tablet TAKE 1 TABLET BY MOUTH TWICE DAILY WITH A MEAL 180 tablet 1   Misc Natural Products (ELDERBERRY IMMUNE  COMPLEX) CHEW Chew 1 tablet by mouth daily.     montelukast (SINGULAIR) 10 MG tablet TAKE 1 TABLET BY MOUTH AT BEDTIME 90 tablet 3   Multiple Vitamin (MULTIVITAMIN WITH MINERALS) TABS tablet Take 1 tablet by mouth daily.     NARCAN 4 MG/0.1ML LIQD nasal spray kit Place 0.4 mg into the nose once.     NOVOLOG 100 UNIT/ML injection MAX DAILY 120 UNITS  DX E11.65 110 mL 3   oxyCODONE-acetaminophen (PERCOCET) 10-325 MG tablet Take 1 tablet by mouth every 8 (eight) hours as needed for pain.     pregabalin (LYRICA) 75 MG capsule Take 75 mg by mouth 2 (two) times daily.     rosuvastatin (CRESTOR) 10 MG tablet Take 1 tablet (10 mg total) by mouth daily. 90 tablet 3   Semaglutide, 2 MG/DOSE, (OZEMPIC, 2 MG/DOSE,) 8 MG/3ML SOPN Inject 2 mg into the skin once a week. 9 mL 3   SYMBICORT 160-4.5 MCG/ACT inhaler Inhale 2 puffs by mouth twice daily 11 g 2   tiZANidine (ZANAFLEX) 4 MG capsule Take 4 mg by mouth 3 (three) times daily as needed for muscle spasms.     valACYclovir (VALTREX) 500 MG tablet TAKE 1 TABLET BY MOUTH TWICE DAILY FOR 5 DAYS AT  TIME  OF  OUTBREAK 20 tablet 3   No current facility-administered medications on file prior to visit.        ROS:  All others reviewed and negative.  Objective        PE:  BP (!) 170/90 (BP Location: Left Arm, Patient Position: Sitting, Cuff Size: Large)   Pulse 73   Temp 98.3 F (36.8 C) (Oral)   Ht 5\' 4"  (1.626 m)   Wt 249 lb 6.4 oz (113.1 kg)   SpO2 94%   BMI 42.81 kg/m                 Constitutional: Pt appears in NAD               HENT: Head: NCAT.                Right Ear: External ear  normal.                 Left Ear: External ear normal.                Eyes: . Pupils are equal, round, and reactive to light. Conjunctivae and EOM are normal               Nose: without d/c or deformity               Neck: Neck supple. Gross normal ROM               Cardiovascular: Normal rate and regular rhythm.                 Pulmonary/Chest: Effort normal and breath sounds without rales or wheezing.                Abd:  Soft, NT, ND, + BS, no organomegaly               Neurological: Pt is alert. At baseline orientation, motor grossly intact               Skin: Skin is warm. No rashes, no other new lesions, LE edema - none               Psychiatric: Pt behavior  is normal without agitation   Micro: none  Cardiac tracings I have personally interpreted today:  none  Pertinent Radiological findings (summarize): none   Lab Results  Component Value Date   WBC 6.9 12/12/2022   HGB 13.6 12/12/2022   HCT 42.6 12/12/2022   PLT 251.0 12/12/2022   GLUCOSE 139 (H) 12/12/2022   CHOL 284 (H) 06/01/2022   TRIG 162.0 (H) 06/01/2022   HDL 44.90 06/01/2022   LDLCALC 207 (H) 06/01/2022   ALT 29 12/12/2022   AST 21 12/12/2022   NA 139 12/12/2022   K 3.6 12/12/2022   CL 104 12/12/2022   CREATININE 0.65 12/12/2022   BUN 12 12/12/2022   CO2 27 12/12/2022   TSH 1.100 03/05/2019   HGBA1C 6.7 (A) 09/06/2022   MICROALBUR <0.7 06/01/2022   Assessment/Plan:  [name] is a 62 y.o. Black or African American [2] female with  has a past medical history of Allergy, Arthritis, Asthma, Cancer (HCC), Chronic back pain, Chronic leg pain, Cocaine abuse (HCC), COPD (chronic obstructive pulmonary disease) (HCC), Diabetes (HCC), GERD (gastroesophageal reflux disease), Hyperlipidemia, Hypertension, Internal hemorrhoids, Intraductal papilloma of left breast (02/18/2016), Neuromuscular disorder (HCC), Personal history of radiation therapy, Postlaminectomy syndrome of lumbar region (12/07/2011), RECTAL BLEEDING  (12/09/2008), Sciatica, Sleep apnea, Tobacco abuse, Tubular adenoma of colon, and Uterine fibroid.  Dysuria Mild to mod, ua and cx, tx pending results  to f/u any worsening symptoms or concerns  Hypertension associated with diabetes (HCC) BP Readings from Last 3 Encounters:  03/03/23 (!) 170/90  02/14/23 (!) 150/70  01/31/23 (!) 159/90   uncontrolled, pt to increase the diovan 160 qd   Type 2 diabetes mellitus with diabetic neuropathy Lab Results  Component Value Date   HGBA1C 6.7 (A) 09/06/2022   Stable, pt to continue current medical treatment farxiga 10 every day, metformin 1000 bid, ozempic 2 mg weekly   Yeast vaginitis Mild to mod, for diflucan 150 mg prn asd,  to f/u any worsening symptoms or concerns  Followup: Return in about 4 weeks (around 03/31/2023) for follow up to Dr Alvy Bimler.  Oliver Barre, MD 03/04/2023 7:28 PM [facility]

## 2023-03-04 ENCOUNTER — Encounter: Payer: Self-pay | Admitting: Internal Medicine

## 2023-03-04 DIAGNOSIS — B3731 Acute candidiasis of vulva and vagina: Secondary | ICD-10-CM | POA: Insufficient documentation

## 2023-03-04 DIAGNOSIS — R300 Dysuria: Secondary | ICD-10-CM | POA: Insufficient documentation

## 2023-03-04 NOTE — Assessment & Plan Note (Signed)
Mild to mod, ua and cx, tx pending results  to f/u any worsening symptoms or concerns

## 2023-03-04 NOTE — Assessment & Plan Note (Signed)
BP Readings from Last 3 Encounters:  03/03/23 (!) 170/90  02/14/23 (!) 150/70  01/31/23 (!) 159/90   uncontrolled, pt to increase the diovan 160 qd

## 2023-03-04 NOTE — Assessment & Plan Note (Signed)
Mild to mod, for diflucan 150 mg prn asd,  to f/u any worsening symptoms or concerns

## 2023-03-04 NOTE — Assessment & Plan Note (Signed)
Lab Results  Component Value Date   HGBA1C 6.7 (A) 09/06/2022   Stable, pt to continue current medical treatment farxiga 10 every day, metformin 1000 bid, ozempic 2 mg weekly

## 2023-03-05 LAB — URINE CULTURE

## 2023-03-05 MED ORDER — CEPHALEXIN 500 MG PO CAPS: 500.0000 mg | ORAL_CAPSULE | Freq: Three times a day (TID) | ORAL | 0 refills | Status: DC

## 2023-03-05 NOTE — Addendum Note (Signed)
Addended by: Corwin Levins on: 03/05/2023 11:24 AM   Modules accepted: Orders

## 2023-03-07 DIAGNOSIS — Z79899 Other long term (current) drug therapy: Secondary | ICD-10-CM | POA: Diagnosis not present

## 2023-03-09 ENCOUNTER — Inpatient Hospital Stay: Payer: PPO | Attending: Hematology and Oncology | Admitting: Hematology and Oncology

## 2023-03-09 DIAGNOSIS — M545 Low back pain, unspecified: Secondary | ICD-10-CM | POA: Diagnosis not present

## 2023-03-09 DIAGNOSIS — R262 Difficulty in walking, not elsewhere classified: Secondary | ICD-10-CM | POA: Diagnosis not present

## 2023-03-09 DIAGNOSIS — G4733 Obstructive sleep apnea (adult) (pediatric): Secondary | ICD-10-CM | POA: Diagnosis not present

## 2023-03-09 DIAGNOSIS — M79605 Pain in left leg: Secondary | ICD-10-CM | POA: Diagnosis not present

## 2023-03-09 DIAGNOSIS — M79604 Pain in right leg: Secondary | ICD-10-CM | POA: Diagnosis not present

## 2023-03-09 NOTE — Assessment & Plan Note (Deleted)
02/16/2021:Screening mammogram: possible asymmetry in the right breast. Diagnostic mammogram and [place]: 0.7 cm suspicious mass in the right breast at 10:00. 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  Adjuvant chemotherapy with Taxotere and Cytoxan every 3 weeks x4 cycles (patient refused) 2. adjuvant radiation completed 05/27/21 3.  Followed by adjuvant antiestrogen therapy with letrozole started 05/24/2021 -------------------------------------------------------------------------------------------------------------------------- Letrozole toxicities: Tolerating it well without any major problems.  Denies any major hot flashes.  She does have some joint stiffness.   Breast cancer surveillance: 1.  Breast exam 03/09/2023: Benign 2. mammogram 02/07/2022: Benign breast density category A, patient needs a new mammogram. 3.  CT abdomen pelvis 12/21/2022: Benign mild hepatic steatosis   Return to clinic in 1 year for follow-up

## 2023-03-10 ENCOUNTER — Encounter: Payer: Self-pay | Admitting: Internal Medicine

## 2023-03-10 ENCOUNTER — Ambulatory Visit: Payer: PPO | Admitting: Internal Medicine

## 2023-03-10 VITALS — BP 134/76 | HR 84 | Ht 64.0 in | Wt 252.0 lb

## 2023-03-10 DIAGNOSIS — Z794 Long term (current) use of insulin: Secondary | ICD-10-CM | POA: Insufficient documentation

## 2023-03-10 DIAGNOSIS — E1140 Type 2 diabetes mellitus with diabetic neuropathy, unspecified: Secondary | ICD-10-CM | POA: Diagnosis not present

## 2023-03-10 DIAGNOSIS — E785 Hyperlipidemia, unspecified: Secondary | ICD-10-CM | POA: Diagnosis not present

## 2023-03-10 DIAGNOSIS — R600 Localized edema: Secondary | ICD-10-CM

## 2023-03-10 DIAGNOSIS — E1165 Type 2 diabetes mellitus with hyperglycemia: Secondary | ICD-10-CM | POA: Diagnosis not present

## 2023-03-10 LAB — POCT GLYCOSYLATED HEMOGLOBIN (HGB A1C): Hemoglobin A1C: 9 % — AB (ref 4.0–5.6)

## 2023-03-10 MED ORDER — ROSUVASTATIN CALCIUM 10 MG PO TABS
10.0000 mg | ORAL_TABLET | Freq: Every day | ORAL | 3 refills | Status: DC
Start: 2023-03-10 — End: 2023-08-03

## 2023-03-10 MED ORDER — DEXCOM G7 SENSOR MISC: 1.0000 | 3 refills | Status: DC

## 2023-03-10 MED ORDER — INSULIN ASPART 100 UNIT/ML IJ SOLN: INTRAMUSCULAR | 3 refills | Status: DC

## 2023-03-10 MED ORDER — EZETIMIBE 10 MG PO TABS
10.0000 mg | ORAL_TABLET | Freq: Every day | ORAL | 3 refills | Status: DC
Start: 2023-03-10 — End: 2023-08-10

## 2023-03-10 MED ORDER — OMNIPOD 5 G7 PODS (GEN 5) MISC: 1.0000 | 3 refills | Status: DC

## 2023-03-10 MED ORDER — OMNIPOD 5 G7 INTRO (GEN 5) KIT: 1.0000 | PACK | 0 refills | Status: DC

## 2023-03-10 MED ORDER — TIRZEPATIDE 2.5 MG/0.5ML ~~LOC~~ SOAJ: 2.5000 mg | SUBCUTANEOUS | 2 refills | Status: DC

## 2023-03-10 NOTE — Progress Notes (Signed)
Name: [name]  Age/ Sex: 62 y.o., female   MRN/ DOB: [id number], 1960/07/19     PCP: Georgina Quint, MD   Reason for Endocrinology Evaluation: Type 2 Diabetes Mellitus  Initial Endocrine Consultative Visit: 03/18/2019    PATIENT IDENTIFIER: [name] is a 62 y.o. female with a past medical history of T2DM, HTN, OSA and dyslipidemia , breast CA(Dx 02/2021) status post right lumpectomy chemo and radiation. The patient has followed with Endocrinology clinic since 03/18/2019 for consultative assistance with management of her diabetes.  DIABETIC HISTORY:  Ms. Mankowski was diagnosed with T2DM many years ago. Has been on Soliqua, Glipizide and V-Go was started in 2019.  Her hemoglobin A1c has ranged from 8.1% in 2016, peaking at 9.8% in 2020.  On her initial visit to our clinic, she was on V-Go 40 with Humulin U-500 , Metformin, and Ozempic with an A1c 9.7% . We stopped the V-Go  And the U-500 due to recurrent hypoglycemia . We started Novolog Mix and continued metformin and Ozempic.   SGLT-2 inhibitor started through PCP 03/2020  She stopped Ozempic 12/222 based on GI recommendations due to abdominal pain  Mounjaro started 02/2023  SUBJECTIVE:   During the last visit (09/06/2022): A1c 6.7%   Today (03/10/2023): Ms. Winings is here for a follow up on her diabetes care.  She checks her blood sugars multiple times daily, through freestyle libre. The patient has not  had hypoglycemic episodes since the last clinic visit.   She continues to follow-up with oncology for Breast Ca that was diagnosed 02/2021, S/P lumpectomy , and radiation  Denies diarrhea or constipation  Denies nausea or vomiting   She developed abdominal pain while on 2 mg of Ozempic, this was discontinued based on GI recommendations, her PCP recommended Mounjaro   This patient with type 2 diabetes is treated with Omnipod  (insulin pump). During the visit the pump basal and bolus doses were reviewed including  carb/insulin rations and supplemental doses. The clinical list was updated. The glucose meter download was reviewed in detail to determine if the current pump settings are providing the best glycemic control without excessive hypoglycemia.  Pump and meter download:    Pump   Omnipod Settings   Insulin type   Novolog    Basal rate       0000 2.7u/h    0800 3.0  I:C ratio       0000 1:1    Enter# 12 gwith breakfast, 14 g with lunch and Supper               Sensitivity       0000  20      Goal       0000  120            Type & Model of Pump: Omnipod Insulin Type: Currently using Novolog .  Body mass index is 43.26 kg/m.  PUMP STATISTICS: Average BG: 263 BG Readings: 1.9/ day Average Daily Carbs (g):  Average Daily Basal:69.6    HOME DIABETES REGIMEN:  Metformin 1000 tablet Twice daily  Ozempic 2 mg weekly ( Fridays)-not taking Farxiga 10 mg daily  Novolog     CONTINUOUS GLUCOSE MONITORING RECORD INTERPRETATION    Dates of Recording: 6/30-9/27/2024 Sensor description: [employer]  Results statistics:   CGM use % of time 73  Average and SD 193/30.2  Time in range 47 %  % Time Above 180 37  % Time above  250 16  % Time Below target 0    Glycemic patterns summary: Patient has been noted with fluctuating BG's with intermittent hyperglycemia throughout the night and through the day Hypoglycemic episodes occurred N/A  Hyperglycemia : Postprandial at times  Overnight periods: stable      DIABETIC COMPLICATIONS: Microvascular complications:  Neuropathy  Denies: CKD, retinopathy  Last eye exam: Completed 12/07/2021   Macrovascular complications:    Denies: CAD, PVD, CVA    HISTORY:  Past Medical History:  Past Medical History:  Diagnosis Date   Allergy    Arthritis    back    Asthma    AS CHILD   Cancer (HCC)    breast CA- Right,   Chronic back pain    Chronic leg pain    due to back pain   Cocaine abuse (HCC)    in remission    COPD (chronic obstructive pulmonary disease) (HCC)    Diabetes (HCC)    with neuropathy   GERD (gastroesophageal reflux disease)    Hyperlipidemia    Hypertension    Internal hemorrhoids    Intraductal papilloma of left breast 02/18/2016   Neuromuscular disorder (HCC)    neuropathy   Personal history of radiation therapy    Postlaminectomy syndrome of lumbar region 12/07/2011   RECTAL BLEEDING 12/09/2008   Annotation: s/p EGD 7/08 mild gastritis, s/p colonoscopy 7/08- benign polyp  s/p polypectomy and isolated diverticulum.  Qualifier: Diagnosis of  By: Ditzler RN, Debra     Sciatica    per patient    Sleep apnea    CPAP    YEARS AGO DONE 1/2 YEARS AGO AND WAS TOLD DID NOT HAVE   Tobacco abuse    Tubular adenoma of colon    Uterine fibroid    s/p hysterectomy   Past Surgical History:  Past Surgical History:  Procedure Laterality Date   BACK SURGERY  2012   L5-S1 microendoscopic disectomy last surgery 06/2011   BREAST BIOPSY     LEFT    01/19/16   BREAST BIOPSY Right 02/16/2021   BREAST EXCISIONAL BIOPSY Left 02/2016   BREAST LUMPECTOMY Right 03/12/2021   BREAST LUMPECTOMY WITH RADIOACTIVE SEED AND SENTINEL LYMPH NODE BIOPSY Right 03/12/2021   Procedure: RIGHT BREAST LUMPECTOMY WITH RADIOACTIVE SEED AND SENTINEL LYMPH NODE BIOPSY;  Surgeon: Griselda Miner, MD;  Location: [facility] OR;  Service: General;  Laterality: Right;   BREAST REDUCTION SURGERY  1982   CATARACT EXTRACTION Right 11/07/2019   COLONOSCOPY     COLONOSCOPY WITH PROPOFOL N/A 05/19/2022   Procedure: COLONOSCOPY WITH PROPOFOL;  Surgeon: Meryl Dare, MD;  Location: [facility];  Service: Gastroenterology;  Laterality: N/A;   HAND SURGERY     MIDDLE TRIGGER FINGER RIGHT SIDE   POLYPECTOMY  05/19/2022   Procedure: POLYPECTOMY;  Surgeon: Meryl Dare, MD;  Location: [facility];  Service: Gastroenterology;;   RADIOACTIVE SEED GUIDED EXCISIONAL BREAST BIOPSY Left 02/18/2016   Procedure: LEFT RADIOACTIVE SEED  GUIDED EXCISIONAL BREAST BIOPSY;  Surgeon: Ovidio Kin, MD;  Location: [facility] OR;  Service: General;  Laterality: Left;   REDUCTION MAMMAPLASTY Bilateral    TONSILLECTOMY  2008   TOTAL ABDOMINAL HYSTERECTOMY  05/24/2007   hysterectomy   TUBAL LIGATION     UPPER GASTROINTESTINAL ENDOSCOPY     Social History:  reports that she quit smoking about 26 years ago. Her smoking use included cigarettes. She started smoking about 46 years ago. She has a 10 pack-year smoking history. She has  never used smokeless tobacco. She reports that she does not currently use alcohol. She reports that she does not currently use drugs after having used the following drugs: Cocaine and Marijuana. Family History:  Family History  Problem Relation Age of Onset   Diabetes Father    Heart disease Father    Hypertension Father    Diabetes Mother    Cancer Mother        brain   Hypertension Mother    Kidney disease Sister    Diabetes Sister    Kidney cancer Sister    Stroke Sister    Diabetes Sister    Hypertension Sister    Diabetes Sister    Hypertension Sister    Obesity Son    Colon cancer Neg Hx    Colon polyps Neg Hx    Esophageal cancer Neg Hx    Rectal cancer Neg Hx    Stomach cancer Neg Hx      HOME MEDICATIONS: Allergies as of 03/10/2023       Reactions   Breo Ellipta [fluticasone Furoate-vilanterol] Other (See Comments)   Urinary retention   Hydrochlorothiazide    Hypokalemia    Lisinopril Cough        Medication List        Accurate as of March 10, 2023 10:20 AM. If you have any questions, ask your nurse or doctor.          STOP taking these medications    Ozempic (2 MG/DOSE) 8 MG/3ML Sopn Generic drug: Semaglutide (2 MG/DOSE) Stopped by: Johnney Ou Salah Nakamura       TAKE these medications    acyclovir 400 MG tablet Commonly known as: ZOVIRAX Take 1 tablet (400 mg total) by mouth 4 (four) times daily.   albuterol 108 (90 Base) MCG/ACT inhaler Commonly known as:  VENTOLIN HFA Inhale 2 puffs into the lungs every 6 (six) hours as needed for wheezing or shortness of breath.   B-12 PO Take 1 tablet by mouth daily.   cephALEXin 500 MG capsule Commonly known as: KEFLEX Take 1 capsule (500 mg total) by mouth 3 (three) times daily.   Continuous Glucose Monitor Devi Use as directed.   dapagliflozin propanediol 10 MG Tabs tablet Commonly known as: Farxiga Take 1 tablet (10 mg total) by mouth daily.   Dexcom G7 Sensor Misc 1 Device by Does not apply route as directed. What changed: See the new instructions. Changed by: Johnney Ou Barnett Elzey   diclofenac Sodium 1 % Gel Commonly known as: VOLTAREN Apply 2 g topically 4 (four) times daily. Rub into affected area of foot 2 to 4 times daily What changed:  when to take this reasons to take this additional instructions   dicyclomine 10 MG capsule Commonly known as: BENTYL TAKE 1 CAPSULE BY MOUTH THREE TIMES DAILY BEFORE MEAL(S)   Elderberry Immune Complex Chew Chew 1 tablet by mouth daily.   esomeprazole 40 MG capsule Commonly known as: NexIUM Take 1 capsule (40 mg total) by mouth daily. Take 30 minutes before breakfast.   ezetimibe 10 MG tablet Commonly known as: Zetia Take 1 tablet (10 mg total) by mouth daily.   famotidine 20 MG tablet Commonly known as: PEPCID Take 1 tablet (20 mg total) by mouth 2 (two) times daily as needed.   fluconazole 150 MG tablet Commonly known as: DIFLUCAN 1 tab by mouth every 3 days as needed   FREESTYLE TEST STRIPS test strip Generic drug: glucose blood 1 each by Other route 3 (  three) times daily.   letrozole 2.5 MG tablet Commonly known as: FEMARA Take 1 tablet by mouth once daily   lidocaine 5 % Commonly known as: Lidoderm Place 1 patch onto the skin daily. Remove & Discard patch within 12 hours or as directed by MD   metFORMIN 1000 MG tablet Commonly known as: GLUCOPHAGE TAKE 1 TABLET BY MOUTH TWICE DAILY WITH A MEAL   montelukast 10 MG  tablet Commonly known as: SINGULAIR TAKE 1 TABLET BY MOUTH AT BEDTIME   multivitamin with minerals Tabs tablet Take 1 tablet by mouth daily.   Narcan 4 MG/0.1ML Liqd nasal spray kit Generic drug: naloxone Place 0.4 mg into the nose once.   NovoLOG 100 UNIT/ML injection Generic drug: insulin aspart MAX DAILY 120 UNITS  DX E11.65   Omnipod 5 G7 Pods (Gen 5) Misc 1 Device by Does not apply route every other day. What changed:  how much to take how to take this when to take this additional instructions Changed by: Johnney Ou Willia Genrich   Omnipod 5 G7 Intro (Gen 5) Kit 1 Device by Does not apply route every other day. What changed: You were already taking a medication with the same name, and this prescription was added. Make sure you understand how and when to take each. Changed by: Johnney Ou Elis Sauber   oxyCODONE-acetaminophen 10-325 MG tablet Commonly known as: PERCOCET Take 1 tablet by mouth every 8 (eight) hours as needed for pain.   pregabalin 75 MG capsule Commonly known as: LYRICA Take 75 mg by mouth 2 (two) times daily.   pregabalin 150 MG capsule Commonly known as: LYRICA Take 150 mg by mouth 2 (two) times daily.   rosuvastatin 10 MG tablet Commonly known as: Crestor Take 1 tablet (10 mg total) by mouth daily.   Symbicort 160-4.5 MCG/ACT inhaler Generic drug: budesonide-formoterol Inhale 2 puffs by mouth twice daily   tirzepatide 2.5 MG/0.5ML Pen Commonly known as: MOUNJARO Inject 2.5 mg into the skin once a week. Started by: Johnney Ou Emelee Rodocker   tiZANidine 4 MG capsule Commonly known as: ZANAFLEX Take 4 mg by mouth 3 (three) times daily as needed for muscle spasms.   valACYclovir 500 MG tablet Commonly known as: VALTREX TAKE 1 TABLET BY MOUTH TWICE DAILY FOR 5 DAYS AT  TIME  OF  OUTBREAK   valsartan 320 MG tablet Commonly known as: DIOVAN Take 1 tablet (320 mg total) by mouth daily.   Vitamin D (Ergocalciferol) 1.25 MG (50000 UNIT) Caps  capsule Commonly known as: DRISDOL Take 50,000 Units by mouth once a week.   VITAMIN D3 PO Take 1 tablet by mouth daily.         OBJECTIVE:   Vital Signs: BP 134/76 (BP Location: Left Arm, Patient Position: Sitting, Cuff Size: Large)   Pulse 84   Ht 5\' 4"  (1.626 m)   Wt 252 lb (114.3 kg)   SpO2 96%   BMI 43.26 kg/m   Wt Readings from Last 3 Encounters:  03/10/23 252 lb (114.3 kg)  03/03/23 249 lb 6.4 oz (113.1 kg)  02/14/23 248 lb 4 oz (112.6 kg)     Exam: General:  NAD  Lungs: Clear with good BS bilat   Heart: RRR   Extremities: Trace  pretibial edema.     DM foot exam: 09/06/2022  The skin of the feet is intact without sores or ulcerations. The pedal pulses are 2+ on right and 2+ on left. The sensation is intact to a screening 5.07, 10  gram monofilament bilaterally    DATA REVIEWED:  Lab Results  Component Value Date   HGBA1C 9.0 (A) 03/10/2023   HGBA1C 6.7 (A) 09/06/2022   HGBA1C 6.6 (A) 06/01/2022    Latest Reference Range & Units 12/12/22 11:47  COMPREHENSIVE METABOLIC PANEL  Rpt !  Sodium 135 - 145 mEq/L 139  Potassium 3.5 - 5.1 mEq/L 3.6  Chloride 96 - 112 mEq/L 104  CO2 19 - 32 mEq/L 27  Glucose 70 - 99 mg/dL 161 (H)  BUN 6 - 23 mg/dL 12  Creatinine 0.96 - 0.45 mg/dL 4.09  Calcium 8.4 - 81.1 mg/dL 9.9  Alkaline Phosphatase 39 - 117 U/L 76  Albumin 3.5 - 5.2 g/dL 4.0  Lipase 91.4 - 78.2 U/L 32.0  AST 0 - 37 U/L 21  ALT 0 - 35 U/L 29  Total Protein 6.0 - 8.3 g/dL 7.4  Total Bilirubin 0.2 - 1.2 mg/dL 0.3  GFR >95.62 mL/min 94.41     ASSESSMENT / PLAN / RECOMMENDATIONS:   1) Type 2 Diabetes Mellitus, Optimally  Controlled , With Neuropathic complications - Most recent A1c of 6.7%. Goal A1c < 7.0 %.    -I have A1c has trended up from 6.7% to 9.0%, this is most likely due to lack of Ozempic -In reviewing her CGM/pump download, the patient continues to skip bolusing with meals, I have again encouraged the patient to continue to bolus with  each meal -I will increase her basal rate as below -She discontinued Ozempic due to abdominal pain, abdominal pain has resolved, patient entertain the idea of Mounjaro, I did explain to the patient that there is also a risk for GI side effects with Mounjaro but we can start her on small dose and see how she does - She will continue to enter  #12 grams  with a full breakfast, #14 for lunch and supper  -We will upgrade the patient to OmniPod/Dexcom G7, new prescription has been sent to [facility],  a new referral has been put to our CDE for the upgrade training     MEDICATIONS: -Start Mounjaro 2.5 mg weekly -Continue Metformin 1000 mg,  1 tablet Twice daily  - Continue Farxiga 10 mg daily  - Novolog per pump     Pump   Omnipod Settings   Insulin type   Novolog    Basal rate       0000 2.7 u/h    0800 3.1  I:C ratio       0000 1:1    Enter# 12 gwith breakfast, 14 g with lunch and Supper               Sensitivity       0000  20      Goal       0000  120           EDUCATION / INSTRUCTIONS: BG monitoring instructions: Patient is instructed to check her blood sugars 2 times a day, fasting and bedtime . Call [place] [facility] if: BG persistently < 70  I reviewed the Rule of 15 for the treatment of hypoglycemia in detail with the patient. Literature supplied.   2. Dyslipidemia :   - She was on Atrovastatin at somepoint but developed leg pains and we switched to Rosuvastatin and Zetia - She has not been taking her cholsterol meds, discussed importance of compliance and risk of CVA and CAD  - A year supply was provided    Medication  Rosuvastatin 10 mg  daily  Zetia 10 mg daily   3. Hypokalemia   -Resolved with discontinuation of hydrochlorothiazide   4.LE Edema: -I discontinued hydrochlorothiazide due to hypokalemia -She has trace edema, encouraged leg elevation and avoiding salts -Patient may also obtain compression stockings   F/U in  6 months     Signed electronically by: Lyndle Herrlich, MD  [facility]  [facility] [address] [place] [place], [place] [address] Phone: [phone] FAX: [phone]   CC: Georgina Quint, MD [address] [place] [address] Phone: [phone]  Fax: [phone]  Return to Endocrinology clinic as below: Future Appointments  Date Time Provider Department Center  03/13/2023  1:30 PM Colletta Maryland, RN [facility] None  04/06/2023 10:00 AM Georgina Quint, MD [facility] None  05/16/2023  8:40 AM Georgina Quint, MD [facility] None  07/03/2023 10:00 AM [facility]-ANNUAL WELLNESS VISIT [facility] None  07/13/2023  8:50 AM Markie Frith, Konrad Dolores, MD [facility] None  11/28/2023 10:00 AM Waymon Budge, MD [facility] None

## 2023-03-10 NOTE — Patient Instructions (Signed)
-   Enter 12 grams with Breakfast, 14 grams with Lunch and Supper  -Start Mounjaro 2.5 mg weekly - Continue Metformin 1 tablet Twice daily  - Continue  Farxiga 10 mg , 1 tablet with breakfast     HOW TO TREAT LOW BLOOD SUGARS (Blood sugar LESS THAN 70 MG/DL) Please follow the RULE OF 15 for the treatment of hypoglycemia treatment (when your (blood sugars are less than 70 mg/dL)   STEP 1: Take 15 grams of carbohydrates when your blood sugar is low, which includes:  3-4 GLUCOSE TABS  OR 3-4 OZ OF JUICE OR REGULAR SODA OR ONE TUBE OF GLUCOSE GEL    STEP 2: RECHECK blood sugar in 15 MINUTES STEP 3: If your blood sugar is still low at the 15 minute recheck --> then, go back to STEP 1 and treat AGAIN with another 15 grams of carbohydrates.

## 2023-03-13 ENCOUNTER — Ambulatory Visit: Payer: Self-pay

## 2023-03-13 ENCOUNTER — Other Ambulatory Visit: Payer: Self-pay

## 2023-03-13 MED ORDER — FREESTYLE LIBRE 2 SENSOR MISC: 0 refills | Status: DC

## 2023-03-13 NOTE — Patient Outreach (Signed)
Care Coordination   Follow Up Visit Note   03/13/2023 Name: [name] MRN: [id number] DOB: August 16, 1960  [name] is a 62 y.o. year old female who sees Sagardia, Eilleen Kempf, MD for primary care. I spoke with  [name] by phone today.  What matters to the patients health and wellness today?  Primary concern today is SOB when walking short distances. She reports when she walks from her care into church yesterday, she notice she was SOB. She states her BP medication, Valsartan, was increased to increase to 320 mg daily and last BP reading was 136/80. Patient states she will call primary care provider to inform of SOB.   Goals Addressed             This Visit's Progress    Health Management       Interventions Today    Flowsheet Row Most Recent Value  Chronic Disease   Chronic disease during today's visit Hypertension (HTN)  General Interventions   General Interventions Discussed/Reviewed General Interventions Reviewed, Doctor Visits  Doctor Visits Discussed/Reviewed Doctor Visits Reviewed  [reviewed upcoming scheduled appointments, discussed importance of notifiying provider of SOB.]  Education Interventions   Education Provided Provided Education  Provided Verbal Education On When to see the doctor  [advised to contact provider to notify of increased SOB with ambulating. advised to take medications as prescribed by provider. encouraged to contact provider with any questions or concerns or worsening condition.]  Pharmacy Interventions   Pharmacy Dicussed/Reviewed Pharmacy Topics Discussed           COMPLETED: Sciatica relief       Interventions Today    Flowsheet Row Most Recent Value  Chronic Disease   Chronic disease during today's visit Diabetes, Other  General Interventions   General Interventions Discussed/Reviewed General Interventions Reviewed, Doctor Visits  Doctor Visits Discussed/Reviewed Doctor Visits Discussed, PCP, Specialist  PCP/Specialist Visits  Compliance with follow-up visit  Education Interventions   Education Provided Provided Education  Provided Verbal Education On --  [advised to continue to take medications as prescribed,reach out to your providers with questions regarding health and medication questions.]  Pharmacy Interventions   Pharmacy Dicussed/Reviewed Pharmacy Topics Discussed            SDOH assessments and interventions completed:  No  Care Coordination Interventions:  Yes, provided   Follow up plan: Follow up call scheduled for 03/29/23    Encounter Outcome:  Patient Visit Completed   Kathyrn Sheriff, RN, MSN, BSN, CCM Care Management Coordinator [phone]

## 2023-03-13 NOTE — Patient Instructions (Signed)
Visit Information  Thank you for taking time to visit with me today. Please don't hesitate to contact me if I can be of assistance to you.   Following are the goals we discussed today:  Contact Primary provider to discuss increased SOB. Continue to take medications as prescribed. Continue to attend provider visits as scheduled Continue to eat healthy, lean meats, vegetables, fruits, avoid saturated and transfats Continue to check blood pressure routinely and contact provider if questions or concerns   Our next appointment is by telephone on 03/29/23 at 11:30 am  Please call the care guide team at [phone] if you need to cancel or reschedule your appointment.   If you are experiencing a Mental Health or Behavioral Health Crisis or need someone to talk to, please call the Suicide and Crisis Lifeline: 988 call the [place] National Suicide Prevention Lifeline: 270-339-7913 or TTY: 567 651 0573 TTY 971-738-7378) to talk to a trained counselor call 1-800-273-TALK (toll free, 24 hour hotline)  Kathyrn Sheriff, RN, MSN, BSN, CCM Care Management Coordinator [phone]

## 2023-03-17 DIAGNOSIS — M545 Low back pain, unspecified: Secondary | ICD-10-CM | POA: Diagnosis not present

## 2023-03-17 DIAGNOSIS — R262 Difficulty in walking, not elsewhere classified: Secondary | ICD-10-CM | POA: Diagnosis not present

## 2023-03-17 DIAGNOSIS — M79605 Pain in left leg: Secondary | ICD-10-CM | POA: Diagnosis not present

## 2023-03-17 DIAGNOSIS — M79604 Pain in right leg: Secondary | ICD-10-CM | POA: Diagnosis not present

## 2023-03-21 DIAGNOSIS — M79604 Pain in right leg: Secondary | ICD-10-CM | POA: Diagnosis not present

## 2023-03-21 DIAGNOSIS — R262 Difficulty in walking, not elsewhere classified: Secondary | ICD-10-CM | POA: Diagnosis not present

## 2023-03-21 DIAGNOSIS — M545 Low back pain, unspecified: Secondary | ICD-10-CM | POA: Diagnosis not present

## 2023-03-21 DIAGNOSIS — M79605 Pain in left leg: Secondary | ICD-10-CM | POA: Diagnosis not present

## 2023-03-27 DIAGNOSIS — E1165 Type 2 diabetes mellitus with hyperglycemia: Secondary | ICD-10-CM | POA: Diagnosis not present

## 2023-03-28 DIAGNOSIS — M79604 Pain in right leg: Secondary | ICD-10-CM | POA: Diagnosis not present

## 2023-03-28 DIAGNOSIS — M545 Low back pain, unspecified: Secondary | ICD-10-CM | POA: Diagnosis not present

## 2023-03-28 DIAGNOSIS — R262 Difficulty in walking, not elsewhere classified: Secondary | ICD-10-CM | POA: Diagnosis not present

## 2023-03-28 DIAGNOSIS — M79605 Pain in left leg: Secondary | ICD-10-CM | POA: Diagnosis not present

## 2023-03-29 ENCOUNTER — Ambulatory Visit: Payer: Self-pay

## 2023-03-29 NOTE — Patient Instructions (Signed)
Visit Information  Thank you for taking time to visit with me today. Please don't hesitate to contact me if I can be of assistance to you.   Following are the goals we discussed today:  Continue to take medications as prescribed. Continue to attend provider visits as scheduled Continue to eat healthy, lean meats, vegetables, fruits, avoid saturated and transfats Continue to check blood sugar as recommended and notify provider if questions or concerns Continue to monitor pressure routinely and contact provider if questions or concerns   Our next appointment is by telephone on 05/02/23 at 10:00 am  Please call the care guide team at [phone] if you need to cancel or reschedule your appointment.   If you are experiencing a Mental Health or Behavioral Health Crisis or need someone to talk to, please call the Suicide and Crisis Lifeline: 988 call the [place] National Suicide Prevention Lifeline: (248) 755-4623 or TTY: (929)840-6818 TTY 414 797 8935) to talk to a trained counselor call 1-800-273-TALK (toll free, 24 hour hotline)  Kathyrn Sheriff, RN, MSN, BSN, CCM Care Management Coordinator [phone]

## 2023-03-29 NOTE — Patient Outreach (Signed)
Care Coordination   Follow Up Visit Note   03/29/2023 Name: [name] MRN: [id number] DOB: Jul 28, 1960  [name] is a 62 y.o. year old female who sees Sagardia, Eilleen Kempf, MD for primary care. I spoke with  [name] by phone today.  What matters to the patients health and wellness today?  Patient reports she is doing ok. Reports blood pressure improved-last checked 126/79. A1C on 03/10/23 9.0 increased from 6.7 on 09/06/22. She continues to be followed by endocrinologist. She reports she is switching from [facility] to a Dexcom 7 and is awaiting for it to come in. Blood sugar this  am was 169 ac. However she also acknowledges having a honey bun late yesterday. She expresses some stress on her job. Decline social work referral. She reports she has noticed some changes in her vision. Patient to contact her ophthalmologist to be seen.  Goals Addressed             This Visit's Progress    Health Management       Interventions Today    Flowsheet Row Most Recent Value  Chronic Disease   Chronic disease during today's visit Diabetes, Hypertension (HTN)  General Interventions   General Interventions Discussed/Reviewed General Interventions Reviewed, Doctor Visits  [Evaluation of current treatment plan for health condition and patient's adherence to plan.]  Doctor Visits Discussed/Reviewed Doctor Visits Reviewed, PCP, Specialist  PCP/Specialist Visits Compliance with follow-up visit  [Reviewed upcoming scheduled appointments]  Exercise Interventions   Exercise Discussed/Reviewed Exercise Reviewed  [encouraged to continue outpatient therapy sessions as recommended. discussed the importance of exercise in diabetes/HTN management and overall health]  Education Interventions   Education Provided Provided Education  [patient confirmed she has received and reviewed previous education sent]  Provided Verbal Education On Blood Sugar Monitoring, Labs, When to see the doctor, Medication,  Exercise, Nutrition, Eye Care  [advised to take medications as prescribed, attend provider visits as scheduled, work with therapist as recommended, discussed treatement for low blood sugar. discussed target fasting blood sugar 80-130 or per provider recommendation]  Labs Reviewed Hgb A1c  [last A1C on 03/10/23 9.0 up from 6.7 on 09/06/22.]  Mental Health Interventions   Mental Health Discussed/Reviewed Mental Health Discussed, Other  [discussed clinical social worker available for asssitance with stress management or coping strategies or as needed. patient declines.]  Nutrition Interventions   Nutrition Discussed/Reviewed Nutrition Discussed, Portion sizes, Decreasing sugar intake  Pharmacy Interventions   Pharmacy Dicussed/Reviewed Pharmacy Topics Reviewed  [medicatrions reviewed]  Safety Interventions   Safety Discussed/Reviewed Safety Discussed            SDOH assessments and interventions completed:  No  Care Coordination Interventions:  Yes, provided   Follow up plan: Follow up call scheduled for 05/02/23    Encounter Outcome:  Patient Visit Completed   Crystal Sheriff, RN, MSN, BSN, CCM Care Management Coordinator [phone]

## 2023-03-31 DIAGNOSIS — E559 Vitamin D deficiency, unspecified: Secondary | ICD-10-CM | POA: Diagnosis not present

## 2023-03-31 DIAGNOSIS — R262 Difficulty in walking, not elsewhere classified: Secondary | ICD-10-CM | POA: Diagnosis not present

## 2023-03-31 DIAGNOSIS — M79604 Pain in right leg: Secondary | ICD-10-CM | POA: Diagnosis not present

## 2023-03-31 DIAGNOSIS — M79605 Pain in left leg: Secondary | ICD-10-CM | POA: Diagnosis not present

## 2023-03-31 DIAGNOSIS — Z79899 Other long term (current) drug therapy: Secondary | ICD-10-CM | POA: Diagnosis not present

## 2023-03-31 DIAGNOSIS — M51362 Other intervertebral disc degeneration, lumbar region with discogenic back pain and lower extremity pain: Secondary | ICD-10-CM | POA: Diagnosis not present

## 2023-03-31 DIAGNOSIS — I10 Essential (primary) hypertension: Secondary | ICD-10-CM | POA: Diagnosis not present

## 2023-03-31 DIAGNOSIS — E119 Type 2 diabetes mellitus without complications: Secondary | ICD-10-CM | POA: Diagnosis not present

## 2023-03-31 DIAGNOSIS — M545 Low back pain, unspecified: Secondary | ICD-10-CM | POA: Diagnosis not present

## 2023-03-31 DIAGNOSIS — R768 Other specified abnormal immunological findings in serum: Secondary | ICD-10-CM | POA: Diagnosis not present

## 2023-04-01 ENCOUNTER — Other Ambulatory Visit: Payer: Self-pay | Admitting: Physician Assistant

## 2023-04-04 ENCOUNTER — Telehealth: Payer: Self-pay

## 2023-04-04 ENCOUNTER — Other Ambulatory Visit (HOSPITAL_COMMUNITY): Payer: Self-pay

## 2023-04-04 DIAGNOSIS — M79605 Pain in left leg: Secondary | ICD-10-CM | POA: Diagnosis not present

## 2023-04-04 DIAGNOSIS — M545 Low back pain, unspecified: Secondary | ICD-10-CM | POA: Diagnosis not present

## 2023-04-04 DIAGNOSIS — M79604 Pain in right leg: Secondary | ICD-10-CM | POA: Diagnosis not present

## 2023-04-04 DIAGNOSIS — R262 Difficulty in walking, not elsewhere classified: Secondary | ICD-10-CM | POA: Diagnosis not present

## 2023-04-04 NOTE — Telephone Encounter (Signed)
Pharmacy Patient Advocate Encounter   Received notification from CoverMyMeds that prior authorization for [facility] is required/requested.   Per test claim: PA required; PA submitted to [facility] ADVANTAGE/RX ADVANCE via CoverMyMeds Key/confirmation #/[id number] Status is pending   PA has been APPROVED through 04/03/2024 PA#: [id number]

## 2023-04-04 NOTE — Progress Notes (Signed)
This encounter was created in error - please disregard. I called patient twice, the second call, I left a detailed message for patient to call office to reschedule.

## 2023-04-05 DIAGNOSIS — Z79899 Other long term (current) drug therapy: Secondary | ICD-10-CM | POA: Diagnosis not present

## 2023-04-06 ENCOUNTER — Ambulatory Visit: Payer: PPO

## 2023-04-06 ENCOUNTER — Other Ambulatory Visit: Payer: Self-pay | Admitting: Emergency Medicine

## 2023-04-06 ENCOUNTER — Encounter: Payer: Self-pay | Admitting: Emergency Medicine

## 2023-04-06 ENCOUNTER — Ambulatory Visit: Payer: PPO | Admitting: Emergency Medicine

## 2023-04-06 VITALS — BP 160/90 | HR 87 | Temp 98.3°F | Ht 64.0 in | Wt 252.1 lb

## 2023-04-06 DIAGNOSIS — Z7985 Long-term (current) use of injectable non-insulin antidiabetic drugs: Secondary | ICD-10-CM

## 2023-04-06 DIAGNOSIS — R300 Dysuria: Secondary | ICD-10-CM | POA: Diagnosis not present

## 2023-04-06 DIAGNOSIS — E1159 Type 2 diabetes mellitus with other circulatory complications: Secondary | ICD-10-CM

## 2023-04-06 DIAGNOSIS — R0609 Other forms of dyspnea: Secondary | ICD-10-CM

## 2023-04-06 DIAGNOSIS — B3731 Acute candidiasis of vulva and vagina: Secondary | ICD-10-CM

## 2023-04-06 DIAGNOSIS — Z7984 Long term (current) use of oral hypoglycemic drugs: Secondary | ICD-10-CM | POA: Diagnosis not present

## 2023-04-06 DIAGNOSIS — Z87891 Personal history of nicotine dependence: Secondary | ICD-10-CM | POA: Diagnosis not present

## 2023-04-06 DIAGNOSIS — Z1231 Encounter for screening mammogram for malignant neoplasm of breast: Secondary | ICD-10-CM

## 2023-04-06 DIAGNOSIS — I10 Essential (primary) hypertension: Secondary | ICD-10-CM | POA: Diagnosis not present

## 2023-04-06 DIAGNOSIS — R079 Chest pain, unspecified: Secondary | ICD-10-CM | POA: Diagnosis not present

## 2023-04-06 DIAGNOSIS — I152 Hypertension secondary to endocrine disorders: Secondary | ICD-10-CM

## 2023-04-06 DIAGNOSIS — N6320 Unspecified lump in the left breast, unspecified quadrant: Secondary | ICD-10-CM

## 2023-04-06 LAB — POCT URINALYSIS DIPSTICK
Bilirubin, UA: NEGATIVE
Blood, UA: NEGATIVE
Glucose, UA: POSITIVE — AB
Ketones, UA: NEGATIVE
Leukocytes, UA: NEGATIVE
Nitrite, UA: NEGATIVE
Protein, UA: NEGATIVE
Spec Grav, UA: 1.01 (ref 1.010–1.025)
Urobilinogen, UA: 0.2 U/dL
pH, UA: 6 (ref 5.0–8.0)

## 2023-04-06 MED ORDER — FLUCONAZOLE 150 MG PO TABS: ORAL_TABLET | ORAL | 0 refills | Status: DC

## 2023-04-06 NOTE — Patient Instructions (Signed)

## 2023-04-06 NOTE — Assessment & Plan Note (Signed)
Elevated blood pressure reading in the office Advised to monitor blood pressure readings at home daily for the next several weeks and keep a log.  Advised to contact the office if numbers persistently abnormal. Continue valsartan 320 mg daily. Lab Results  Component Value Date   HGBA1C 9.0 (A) 03/10/2023  Uncontrolled diabetes.  Sees endocrinologist on a regular basis Medications handled by her office.

## 2023-04-06 NOTE — Progress Notes (Signed)
[name] 62 y.o.   Chief Complaint  Patient presents with   Medical Management of Chronic Issues    4 week f/u appt, patient states she was seen recently for a yeast infection that has not cleared up. Patient states it is getting worse.     HISTORY OF PRESENT ILLNESS: [name] here for 4-week follow-up of office visit 03/03/2023 when she presented with dysuria. Was also complaining of vaginal itching.  Was treated with 1 dose of Diflucan and antibiotics for possible UTI Still complaining of vaginal itching.  Diabetic on Farxiga. Last endocrinologist office visit assessment and plan as follows: ASSESSMENT / PLAN / RECOMMENDATIONS:    1) Type 2 Diabetes Mellitus, Optimally  Controlled , With Neuropathic complications - Most recent A1c of 6.7%. Goal A1c < 7.0 %.     -I have A1c has trended up from 6.7% to 9.0%, this is most likely due to lack of Ozempic -In reviewing her CGM/pump download, the patient continues to skip bolusing with meals, I have again encouraged the patient to continue to bolus with each meal -I will increase her basal rate as below -She discontinued Ozempic due to abdominal pain, abdominal pain has resolved, patient entertain the idea of Mounjaro, I did explain to the patient that there is also a risk for GI side effects with Mounjaro but we can start her on small dose and see how she does - She will continue to enter  #12 grams  with a full breakfast, #14 for lunch and supper  -We will upgrade the patient to OmniPod/Dexcom G7, new prescription has been sent to [facility],  a new referral has been put to our CDE for the upgrade training   MEDICATIONS: -Start Mounjaro 2.5 mg weekly -Continue Metformin 1000 mg,  1 tablet Twice daily  - Continue Farxiga 10 mg daily  - Novolog per pump  2. Dyslipidemia :     - She was on Atrovastatin at somepoint but developed leg pains and we switched to Rosuvastatin and Zetia - She has not been taking her cholsterol meds,  discussed importance of compliance and risk of CVA and CAD  - A year supply was provided      Medication  Rosuvastatin 10 mg daily  Zetia 10 mg daily    3. Hypokalemia    -Resolved with discontinuation of hydrochlorothiazide     4.LE Edema: -I discontinued hydrochlorothiazide due to hypokalemia -She has trace edema, encouraged leg elevation and avoiding salts -Patient may also obtain compression stockings     F/U in  6 months      Signed electronically by: Lyndle Herrlich, MD   [facility]  [facility]  HPI   Prior to Admission medications   Medication Sig Start Date End Date Taking? Authorizing Provider  acyclovir (ZOVIRAX) 400 MG tablet Take 1 tablet (400 mg total) by mouth 4 (four) times daily. 05/30/22   Georgina Quint, MD  albuterol (VENTOLIN HFA) 108 (90 Base) MCG/ACT inhaler Inhale 2 puffs into the lungs every 6 (six) hours as needed for wheezing or shortness of breath. 05/28/20   Jetty Duhamel D, MD  cephALEXin (KEFLEX) 500 MG capsule Take 1 capsule (500 mg total) by mouth 3 (three) times daily. Patient not taking: Reported on 03/29/2023 03/05/23   Corwin Levins, MD  Cholecalciferol (VITAMIN D3 PO) Take 1 tablet by mouth daily.    [provider]  Continuous Glucose Monitor DEVI Use as directed.  03/28/17   John Giovanni, MD  Continuous Glucose Sensor (DEXCOM G7 SENSOR) MISC 1 Device by Does not apply route as directed. Patient not taking: Reported on 03/29/2023 03/10/23   Shamleffer, Konrad Dolores, MD  Continuous Glucose Sensor (FREESTYLE LIBRE 2 SENSOR) MISC Change sensors every 14 days 03/13/23   Shamleffer, Konrad Dolores, MD  Cyanocobalamin (B-12 PO) Take 1 tablet by mouth daily.    [provider]  dapagliflozin propanediol (FARXIGA) 10 MG TABS tablet Take 1 tablet (10 mg total) by mouth daily. 09/06/22   Shamleffer, Konrad Dolores, MD  diclofenac Sodium (VOLTAREN) 1 % GEL Apply 2 g topically 4  (four) times daily. Rub into affected area of foot 2 to 4 times daily Patient taking differently: Apply 2 g topically 4 (four) times daily as needed (pain). 01/14/22   Vivi Barrack, DPM  dicyclomine (BENTYL) 10 MG capsule TAKE 1 CAPSULE BY MOUTH THREE TIMES DAILY BEFORE MEAL(S) 02/04/22   Esterwood, Amy S, PA-C  esomeprazole (NEXIUM) 40 MG capsule Take 1 capsule (40 mg total) by mouth daily. 04/03/23   Meryl Dare, MD  ezetimibe (ZETIA) 10 MG tablet Take 1 tablet (10 mg total) by mouth daily. 03/10/23 03/09/24  Shamleffer, Konrad Dolores, MD  famotidine (PEPCID) 20 MG tablet Take 1 tablet (20 mg total) by mouth 2 (two) times daily as needed. 02/04/22   Esterwood, Amy S, PA-C  fluconazole (DIFLUCAN) 150 MG tablet 1 tab by mouth every 3 days as needed 03/03/23   Corwin Levins, MD  glucose blood (FREESTYLE TEST STRIPS) test strip 1 each by Other route 3 (three) times daily. 09/06/21   Shamleffer, Konrad Dolores, MD  insulin aspart (NOVOLOG) 100 UNIT/ML injection MAX DAILY 120 UNITS  DX E11.65 03/10/23   Shamleffer, Konrad Dolores, MD  Insulin Disposable Pump (OMNIPOD 5 G7 INTRO, GEN 5,) KIT 1 Device by Does not apply route every other day. 03/10/23   Shamleffer, Konrad Dolores, MD  Insulin Disposable Pump (OMNIPOD 5 G7 PODS, GEN 5,) MISC 1 Device by Does not apply route every other day. 03/10/23   Shamleffer, Konrad Dolores, MD  letrozole [facility]) 2.5 MG tablet Take 1 tablet by mouth once daily 06/20/22   Serena Croissant, MD  lidocaine (LIDODERM) 5 % Place 1 patch onto the skin daily. Remove & Discard patch within 12 hours or as directed by MD 10/22/22   Wynetta Fines, MD  metFORMIN (GLUCOPHAGE) 1000 MG tablet TAKE 1 TABLET BY MOUTH TWICE DAILY WITH A MEAL 02/23/23   Shamleffer, Konrad Dolores, MD  Misc Natural Products (ELDERBERRY IMMUNE COMPLEX) CHEW Chew 1 tablet by mouth daily.    [provider]  montelukast (SINGULAIR) 10 MG tablet TAKE 1 TABLET BY MOUTH AT BEDTIME 03/01/23   Glenford Bayley, NP  Multiple Vitamin (MULTIVITAMIN WITH MINERALS) TABS tablet Take 1 tablet by mouth daily.    [provider]  NARCAN 4 MG/0.1ML LIQD nasal spray kit Place 0.4 mg into the nose once. 12/06/18   [provider]  oxyCODONE-acetaminophen (PERCOCET) 10-325 MG tablet Take 1 tablet by mouth every 8 (eight) hours as needed for pain. 03/02/20   [provider]  pregabalin (LYRICA) 150 MG capsule Take 150 mg by mouth 2 (two) times daily. 03/03/23   [provider]  rosuvastatin (CRESTOR) 10 MG tablet Take 1 tablet (10 mg total) by mouth daily. 03/10/23   Shamleffer, Konrad Dolores, MD  SYMBICORT 160-4.5 MCG/ACT inhaler Inhale 2 puffs by mouth twice daily 07/29/22  Jetty Duhamel D, MD  tirzepatide [facility]) 2.5 MG/0.5ML Pen Inject 2.5 mg into the skin once a week. Patient not taking: Reported on 03/29/2023 03/10/23   Shamleffer, Konrad Dolores, MD  tiZANidine (ZANAFLEX) 4 MG capsule Take 4 mg by mouth 3 (three) times daily as needed for muscle spasms. 07/23/21   [provider]  valACYclovir (VALTREX) 500 MG tablet TAKE 1 TABLET BY MOUTH TWICE DAILY FOR 5 DAYS AT  TIME  OF  OUTBREAK 12/22/22   Georgina Quint, MD  valsartan (DIOVAN) 320 MG tablet Take 1 tablet (320 mg total) by mouth daily. 03/03/23   Corwin Levins, MD  Vitamin D, Ergocalciferol, (DRISDOL) 1.25 MG (50000 UNIT) CAPS capsule Take 50,000 Units by mouth once a week. 03/03/23   [provider]    Allergies  Allergen Reactions   Breo Ellipta [Fluticasone Furoate-Vilanterol] Other (See Comments)    Urinary retention   Hydrochlorothiazide     Hypokalemia    Lisinopril Cough    Patient Active Problem List   Diagnosis Date Noted   Dyslipidemia 03/10/2023   Type 2 diabetes mellitus with hyperglycemia, with long-term current use of insulin (HCC) 03/10/2023   Dysuria 03/04/2023   Yeast vaginitis 03/04/2023   Discoloration of skin 02/14/2023   Chronic pain of left ankle  02/14/2023   Lumbar degenerative disc disease 10/27/2022   Spinal stenosis of lumbar region without neurogenic claudication 10/27/2022   Osteoarthritis of spine with radiculopathy, lumbar region 10/27/2022   Neck pain on left side 09/28/2022   Soft tissue mass 09/28/2022   History of colonic polyps 05/19/2022   Benign neoplasm of ascending colon 05/19/2022   Benign neoplasm of transverse colon 05/19/2022   Malignant neoplasm of upper-outer quadrant of right breast in female, estrogen receptor positive (HCC) 02/22/2021   Body mass index (BMI) 40.0-44.9, adult (HCC) 07/28/2020   Lumbago with sciatica, right side 07/28/2020   OSA on CPAP 10/31/2018   Stress incontinence in female 03/09/2017   Hepatic steatosis 08/29/2016   Herpes simplex 03/08/2016   Diabetic neuropathy with neurologic complication (HCC) 10/20/2015   Long term current use of opiate analgesic 07/21/2015   COPD GOLD II  05/31/2013   Lung nodule < 6cm on CT 05/31/2013   Chronic cough 12/07/2012   Chronic pain syndrome 02/13/2012   Lumbar radiculopathy 11/29/2011   Type 2 diabetes mellitus with diabetic neuropathy (HCC) 08/25/2011   Back pain 11/15/2010   Hypertension associated with diabetes (HCC) 07/07/2009   GERD 01/08/2009   Dyslipidemia associated with type 2 diabetes mellitus (HCC) 12/09/2008   Hyperlipidemia 12/09/2008   Morbid obesity due to excess calories (HCC) 12/09/2008    Past Medical History:  Diagnosis Date   Allergy    Arthritis    back    Asthma    AS CHILD   Cancer (HCC)    breast CA- Right,   Chronic back pain    Chronic leg pain    due to back pain   Cocaine abuse (HCC)    in remission   COPD (chronic obstructive pulmonary disease) (HCC)    Diabetes (HCC)    with neuropathy   GERD (gastroesophageal reflux disease)    Hyperlipidemia    Hypertension    Internal hemorrhoids    Intraductal papilloma of left breast 02/18/2016   Neuromuscular disorder (HCC)    neuropathy   Personal  history of radiation therapy    Postlaminectomy syndrome of lumbar region 12/07/2011   RECTAL BLEEDING 12/09/2008   Annotation: s/p  EGD 7/08 mild gastritis, s/p colonoscopy 7/08- benign polyp  s/p polypectomy and isolated diverticulum.  Qualifier: Diagnosis of  By: Ditzler RN, Debra     Sciatica    per patient    Sleep apnea    CPAP    YEARS AGO DONE 1/2 YEARS AGO AND WAS TOLD DID NOT HAVE   Tobacco abuse    Tubular adenoma of colon    Uterine fibroid    s/p hysterectomy    Past Surgical History:  Procedure Laterality Date   BACK SURGERY  2012   L5-S1 microendoscopic disectomy last surgery 06/2011   BREAST BIOPSY     LEFT    01/19/16   BREAST BIOPSY Right 02/16/2021   BREAST EXCISIONAL BIOPSY Left 02/2016   BREAST LUMPECTOMY Right 03/12/2021   BREAST LUMPECTOMY WITH RADIOACTIVE SEED AND SENTINEL LYMPH NODE BIOPSY Right 03/12/2021   Procedure: RIGHT BREAST LUMPECTOMY WITH RADIOACTIVE SEED AND SENTINEL LYMPH NODE BIOPSY;  Surgeon: Griselda Miner, MD;  Location: [facility] OR;  Service: General;  Laterality: Right;   BREAST REDUCTION SURGERY  1982   CATARACT EXTRACTION Right 11/07/2019   COLONOSCOPY     COLONOSCOPY WITH PROPOFOL N/A 05/19/2022   Procedure: COLONOSCOPY WITH PROPOFOL;  Surgeon: Meryl Dare, MD;  Location: [facility];  Service: Gastroenterology;  Laterality: N/A;   HAND SURGERY     MIDDLE TRIGGER FINGER RIGHT SIDE   POLYPECTOMY  05/19/2022   Procedure: POLYPECTOMY;  Surgeon: Meryl Dare, MD;  Location: [facility];  Service: Gastroenterology;;   RADIOACTIVE SEED GUIDED EXCISIONAL BREAST BIOPSY Left 02/18/2016   Procedure: LEFT RADIOACTIVE SEED GUIDED EXCISIONAL BREAST BIOPSY;  Surgeon: Ovidio Kin, MD;  Location: [facility] OR;  Service: General;  Laterality: Left;   REDUCTION MAMMAPLASTY Bilateral    TONSILLECTOMY  2008   TOTAL ABDOMINAL HYSTERECTOMY  05/24/2007   hysterectomy   TUBAL LIGATION     UPPER GASTROINTESTINAL ENDOSCOPY      Social History   Socioeconomic  History   Marital status: Married    Spouse name: Not on file   Number of children: 3   Years of education: Not on file   Highest education level: Some college, no degree  Occupational History    Comment: [employer]  Tobacco Use   Smoking status: Former    Current packs/day: 0.00    Average packs/day: 0.5 packs/day for 20.0 years (10.0 ttl pk-yrs)    Types: Cigarettes    Start date: 07/23/1976    Quit date: 07/23/1996    Years since quitting: 26.7   Smokeless tobacco: Never  Vaping Use   Vaping status: Never Used  Substance and Sexual Activity   Alcohol use: Not Currently    Comment: recovering addict clean for 9 years   Drug use: Not Currently    Types: Cocaine, Marijuana    Comment: recovering addict clean for 16 years   Sexual activity: Yes    Birth control/protection: Surgical  Other Topics Concern   Not on file  Social History Narrative   Current Social History 01/17/2020        Patient lives with spouse in a home which is 1 story. There are not steps up to the entrance the patient uses.       Patient's method of transportation is personal car.      The highest level of education was some college.      The patient currently works part-time.      Identified important Relationships are God, husband,  kids, grandkids       Pets : None       Interests / Fun: Drawing and nature       Current Stressors: Pain, Covid       Religious / Personal Beliefs: Christian       Other: None    Social Determinants of Health   Financial Resource Strain: Low Risk  (02/13/2023)   Overall Financial Resource Strain (CARDIA)    Difficulty of Paying Living Expenses: Not very hard  Food Insecurity: No Food Insecurity (02/13/2023)   Hunger Vital Sign    Worried About Running Out of Food in the Last Year: Never true    Ran Out of Food in the Last Year: Never true  Transportation Needs: No Transportation Needs (02/13/2023)   PRAPARE - Administrator, Civil Service  (Medical): No    Lack of Transportation (Non-Medical): No  Physical Activity: Insufficiently Active (02/13/2023)   Exercise Vital Sign    Days of Exercise per Week: 2 days    Minutes of Exercise per Session: 60 min  Stress: No Stress Concern Present (02/13/2023)   [employer] of Occupational Health - Occupational Stress Questionnaire    Feeling of Stress : Not at all  Social Connections: Socially Integrated (02/13/2023)   Social Connection and Isolation Panel [NHANES]    Frequency of Communication with Friends and Family: More than three times a week    Frequency of Social Gatherings with Friends and Family: Once a week    Attends Religious Services: More than 4 times per year    Active Member of [employer] or Organizations: Yes    Attends Engineer, structural: More than 4 times per year    Marital Status: Married  Catering manager Violence: Not At Risk (02/18/2022)   Humiliation, Afraid, Rape, and Kick questionnaire    Fear of Current or Ex-Partner: No    Emotionally Abused: No    Physically Abused: No    Sexually Abused: No    Family History  Problem Relation Age of Onset   Diabetes Father    Heart disease Father    Hypertension Father    Diabetes Mother    Cancer Mother        brain   Hypertension Mother    Kidney disease Sister    Diabetes Sister    Kidney cancer Sister    Stroke Sister    Diabetes Sister    Hypertension Sister    Diabetes Sister    Hypertension Sister    Obesity Son    Colon cancer Neg Hx    Colon polyps Neg Hx    Esophageal cancer Neg Hx    Rectal cancer Neg Hx    Stomach cancer Neg Hx      Review of Systems  Constitutional: Negative.  Negative for chills and fever.  HENT: Negative.  Negative for congestion and sore throat.   Respiratory: Negative.  Negative for cough and shortness of breath.   Cardiovascular: Negative.  Negative for chest pain and palpitations.  Gastrointestinal:  Negative for abdominal pain, diarrhea, nausea and  vomiting.  Genitourinary:  Positive for dysuria. Negative for hematuria.  Skin: Negative.  Negative for rash.  Neurological: Negative.  Negative for dizziness and headaches.  All other systems reviewed and are negative.   Today's Vitals   04/06/23 1000  BP: (!) 160/90  Pulse: 87  Temp: 98.3 F (36.8 C)  TempSrc: Oral  SpO2: 97%  Weight: 252 lb 2  oz (114.4 kg)  Height: 5\' 4"  (1.626 m)   Body mass index is 43.28 kg/m.   Physical Exam Vitals reviewed.  Constitutional:      Appearance: Normal appearance. She is obese.  HENT:     Head: Normocephalic.     Mouth/Throat:     Mouth: Mucous membranes are moist.     Pharynx: Oropharynx is clear.  Eyes:     Extraocular Movements: Extraocular movements intact.     Conjunctiva/sclera: Conjunctivae normal.     Pupils: Pupils are equal, round, and reactive to light.  Cardiovascular:     Rate and Rhythm: Normal rate and regular rhythm.     Pulses: Normal pulses.     Heart sounds: Normal heart sounds.  Pulmonary:     Effort: Pulmonary effort is normal.     Breath sounds: Normal breath sounds.  Abdominal:     Palpations: Abdomen is soft.     Tenderness: There is no abdominal tenderness.  Musculoskeletal:     Cervical back: No tenderness.  Lymphadenopathy:     Cervical: No cervical adenopathy.  Skin:    General: Skin is warm and dry.     Capillary Refill: Capillary refill takes less than 2 seconds.  Neurological:     General: No focal deficit present.     Mental Status: She is alert and oriented to person, place, and time.  Psychiatric:        Mood and Affect: Mood normal.        Behavior: Behavior normal.    Results for orders placed or performed in visit on 04/06/23 (from the past 24 hour(s))  POCT Urinalysis Dipstick     Status: Abnormal   Collection Time: 04/06/23 10:32 AM  Result Value Ref Range   Color, UA yellow    Clarity, UA clear    Glucose, UA Positive (A) Negative   Bilirubin, UA negative    Ketones, UA  negative    Spec Grav, UA 1.010 1.010 - 1.025   Blood, UA negative    pH, UA 6.0 5.0 - 8.0   Protein, UA Negative Negative   Urobilinogen, UA 0.2 0.2 or 1.0 E.U./dL   Nitrite, UA negative    Leukocytes, UA Negative Negative   Appearance     Odor     *Note: Due to a large number of results and/or encounters for the requested time period, some results have not been displayed. A complete set of results can be found in Results Review.     ASSESSMENT & PLAN: A total of 44 minutes was spent with the patient and counseling/coordination of care regarding preparing for this visit, review of most recent office visit notes, review of most recent endocrinologist office visit notes, review of multiple chronic medical conditions under management, review of all medications, review of most recent blood work results, diagnosis of yeast vaginitis and need for medication, cardiovascular risks associated with uncontrolled hypertension and need to closely monitor it at home, review of urinalysis done today, education on nutrition, prognosis, documentation and need for follow-up Problem List Items Addressed This Visit       Cardiovascular and Mediastinum   Hypertension associated with diabetes (HCC) - Primary    Elevated blood pressure reading in the office Advised to monitor blood pressure readings at home daily for the next several weeks and keep a log.  Advised to contact the office if numbers persistently abnormal. Continue valsartan 320 mg daily. Lab Results  Component Value Date   HGBA1C  9.0 (A) 03/10/2023  Uncontrolled diabetes.  Sees endocrinologist on a regular basis Medications handled by her office.       Relevant Orders   DG Chest 2 View     Genitourinary   Yeast vaginitis    Secondary to Farxiga use Recommend Diflucan 150 mg daily for 5 days. Advised to keep area dry      Relevant Medications   fluconazole (DIFLUCAN) 150 MG tablet     Other   Dyspnea on exertion    Clinically  euvolemic.  Clear lungs.  No peripheral edema. Chest x-ray done today. Multifactorial.      Relevant Orders   DG Chest 2 View   Dysuria    Normal urinalysis.  No signs of UTI. Urine sent for culture today.      Relevant Orders   Urine Culture   POCT Urinalysis Dipstick (Completed)   Other Visit Diagnoses     Screening mammogram for breast cancer       Relevant Orders   MM Digital Screening      Patient Instructions  Vaginal Yeast Infection, Adult  Vaginal yeast infection is a condition that causes vaginal discharge as well as soreness, swelling, and redness (inflammation) of the vagina. This is a common condition. Some women get this infection frequently. What are the causes? This condition is caused by a change in the normal balance of the yeast (Candida) and normal bacteria that live in the vagina. This change causes an overgrowth of yeast, which causes the inflammation. What increases the risk? The condition is more likely to develop in women who: Take antibiotic medicines. Have diabetes. Take birth control pills. Are pregnant. Douche often. Have a weak body defense system (immune system). Have been taking steroid medicines for a long time. Frequently wear tight clothing. What are the signs or symptoms? Symptoms of this condition include: White, thick, creamy vaginal discharge. Swelling, itching, redness, and irritation of the vagina. The lips of the vagina (labia) may be affected as well. Pain or a burning feeling while urinating. Pain during sex. How is this diagnosed? This condition is diagnosed based on: Your medical history. A physical exam. A pelvic exam. Your health care provider will examine a sample of your vaginal discharge under a microscope. Your health care provider may send this sample for testing to confirm the diagnosis. How is this treated? This condition is treated with medicine. Medicines may be over-the-counter or prescription. You may be  told to use one or more of the following: Medicine that is taken by mouth (orally). Medicine that is applied as a cream (topically). Medicine that is inserted directly into the vagina (suppository). Follow these instructions at home: Take or apply over-the-counter and prescription medicines only as told by your health care provider. Do not use tampons until your health care provider approves. Do not have sex until your infection has cleared. Sex can prolong or worsen your symptoms of infection. Ask your health care provider when it is safe to resume sexual activity. Keep all follow-up visits. This is important. How is this prevented?  Do not wear tight clothes, such as pantyhose or tight pants. Wear breathable cotton underwear. Do not use douches, perfumed soap, creams, or powders. Wipe from front to back after using the toilet. If you have diabetes, keep your blood sugar levels under control. Ask your health care provider for other ways to prevent yeast infections. Contact a health care provider if: You have a fever. Your symptoms go away and then  return. Your symptoms do not get better with treatment. Your symptoms get worse. You have new symptoms. You develop blisters in or around your vagina. You have blood coming from your vagina and it is not your menstrual period. You develop pain in your abdomen. Summary Vaginal yeast infection is a condition that causes discharge as well as soreness, swelling, and redness (inflammation) of the vagina. This condition is treated with medicine. Medicines may be over-the-counter or prescription. Take or apply over-the-counter and prescription medicines only as told by your health care provider. Do not douche. Resume sexual activity or use of tampons as instructed by your health care provider. Contact a health care provider if your symptoms do not get better with treatment or your symptoms go away and then return. This information is not intended  to replace advice given to you by your health care provider. Make sure you discuss any questions you have with your health care provider. Document Revised: 08/17/2020 Document Reviewed: 08/17/2020 Elsevier Patient Education  2024 Elsevier Inc.     Edwina Barth, MD [facility]

## 2023-04-06 NOTE — Assessment & Plan Note (Signed)
Clinically euvolemic.  Clear lungs.  No peripheral edema. Chest x-ray done today. Multifactorial.

## 2023-04-06 NOTE — Assessment & Plan Note (Signed)
Normal urinalysis.  No signs of UTI. Urine sent for culture today.

## 2023-04-06 NOTE — Assessment & Plan Note (Signed)
Secondary to [place] use Recommend Diflucan 150 mg daily for 5 days. Advised to keep area dry

## 2023-04-07 DIAGNOSIS — R262 Difficulty in walking, not elsewhere classified: Secondary | ICD-10-CM | POA: Diagnosis not present

## 2023-04-07 DIAGNOSIS — M79604 Pain in right leg: Secondary | ICD-10-CM | POA: Diagnosis not present

## 2023-04-07 DIAGNOSIS — M79605 Pain in left leg: Secondary | ICD-10-CM | POA: Diagnosis not present

## 2023-04-07 DIAGNOSIS — M545 Low back pain, unspecified: Secondary | ICD-10-CM | POA: Diagnosis not present

## 2023-04-07 LAB — URINE CULTURE

## 2023-04-13 DIAGNOSIS — M79604 Pain in right leg: Secondary | ICD-10-CM | POA: Diagnosis not present

## 2023-04-13 DIAGNOSIS — M545 Low back pain, unspecified: Secondary | ICD-10-CM | POA: Diagnosis not present

## 2023-04-13 DIAGNOSIS — R262 Difficulty in walking, not elsewhere classified: Secondary | ICD-10-CM | POA: Diagnosis not present

## 2023-04-13 DIAGNOSIS — M79605 Pain in left leg: Secondary | ICD-10-CM | POA: Diagnosis not present

## 2023-04-18 ENCOUNTER — Telehealth: Payer: Self-pay | Admitting: Internal Medicine

## 2023-04-18 DIAGNOSIS — G4733 Obstructive sleep apnea (adult) (pediatric): Secondary | ICD-10-CM | POA: Diagnosis not present

## 2023-04-18 NOTE — Telephone Encounter (Signed)
Patient is calling to say that she has received the [facility]  She states that she was supposed to call to get someone to tell how her how to use it.

## 2023-04-18 NOTE — Telephone Encounter (Signed)
Left vm with directions on how to use the [facility]. Once weekly and you use one pen per week.  Medication should be kept in fridge.

## 2023-04-20 ENCOUNTER — Telehealth: Payer: Self-pay

## 2023-04-20 NOTE — Telephone Encounter (Signed)
Patient has her supplies and needs to be set up for training.

## 2023-04-27 ENCOUNTER — Encounter: Payer: Self-pay | Admitting: Internal Medicine

## 2023-04-28 ENCOUNTER — Telehealth: Payer: Self-pay

## 2023-04-28 DIAGNOSIS — M79605 Pain in left leg: Secondary | ICD-10-CM | POA: Diagnosis not present

## 2023-04-28 DIAGNOSIS — M545 Low back pain, unspecified: Secondary | ICD-10-CM | POA: Diagnosis not present

## 2023-04-28 DIAGNOSIS — M51362 Other intervertebral disc degeneration, lumbar region with discogenic back pain and lower extremity pain: Secondary | ICD-10-CM | POA: Diagnosis not present

## 2023-04-28 DIAGNOSIS — R768 Other specified abnormal immunological findings in serum: Secondary | ICD-10-CM | POA: Diagnosis not present

## 2023-04-28 DIAGNOSIS — E119 Type 2 diabetes mellitus without complications: Secondary | ICD-10-CM | POA: Diagnosis not present

## 2023-04-28 DIAGNOSIS — R262 Difficulty in walking, not elsewhere classified: Secondary | ICD-10-CM | POA: Diagnosis not present

## 2023-04-28 DIAGNOSIS — M79604 Pain in right leg: Secondary | ICD-10-CM | POA: Diagnosis not present

## 2023-04-28 DIAGNOSIS — E559 Vitamin D deficiency, unspecified: Secondary | ICD-10-CM | POA: Diagnosis not present

## 2023-04-28 DIAGNOSIS — I10 Essential (primary) hypertension: Secondary | ICD-10-CM | POA: Diagnosis not present

## 2023-04-28 DIAGNOSIS — Z79899 Other long term (current) drug therapy: Secondary | ICD-10-CM | POA: Diagnosis not present

## 2023-04-28 NOTE — Telephone Encounter (Signed)
Patient would like to come in for training Monday, She can use some sample of the Dexcom G7 if needed until approval

## 2023-04-28 NOTE — Telephone Encounter (Signed)
PA need on Dexcom

## 2023-05-01 ENCOUNTER — Other Ambulatory Visit: Payer: Self-pay | Admitting: Emergency Medicine

## 2023-05-01 ENCOUNTER — Other Ambulatory Visit (HOSPITAL_COMMUNITY): Payer: Self-pay

## 2023-05-01 ENCOUNTER — Encounter: Payer: Self-pay | Admitting: Emergency Medicine

## 2023-05-01 DIAGNOSIS — M79604 Pain in right leg: Secondary | ICD-10-CM | POA: Diagnosis not present

## 2023-05-01 DIAGNOSIS — E1159 Type 2 diabetes mellitus with other circulatory complications: Secondary | ICD-10-CM

## 2023-05-01 DIAGNOSIS — R0609 Other forms of dyspnea: Secondary | ICD-10-CM

## 2023-05-01 DIAGNOSIS — R262 Difficulty in walking, not elsewhere classified: Secondary | ICD-10-CM | POA: Diagnosis not present

## 2023-05-01 DIAGNOSIS — R300 Dysuria: Secondary | ICD-10-CM

## 2023-05-01 DIAGNOSIS — M79605 Pain in left leg: Secondary | ICD-10-CM | POA: Diagnosis not present

## 2023-05-01 DIAGNOSIS — B3731 Acute candidiasis of vulva and vagina: Secondary | ICD-10-CM

## 2023-05-01 DIAGNOSIS — M545 Low back pain, unspecified: Secondary | ICD-10-CM | POA: Diagnosis not present

## 2023-05-01 DIAGNOSIS — N6320 Unspecified lump in the left breast, unspecified quadrant: Secondary | ICD-10-CM

## 2023-05-01 NOTE — Telephone Encounter (Signed)
 Care team updated and letter sent for eye exam notes.

## 2023-05-02 ENCOUNTER — Telehealth: Payer: Self-pay

## 2023-05-02 DIAGNOSIS — Z79899 Other long term (current) drug therapy: Secondary | ICD-10-CM | POA: Diagnosis not present

## 2023-05-02 NOTE — Telephone Encounter (Signed)
I have scheduled her for Monday.

## 2023-05-02 NOTE — Patient Outreach (Signed)
  Care Coordination   05/02/2023 Name: [name] MRN: [id number] DOB: [birth year]   Care Coordination Outreach Attempts:  An unsuccessful telephone outreach was attempted for a scheduled appointment today.  Follow Up Plan:  Additional outreach attempts will be made to offer the patient care coordination information and services.   Encounter Outcome:  No Answer   Care Coordination Interventions:  No, not indicated    Kathyrn Sheriff, RN, MSN, BSN, CCM Care Management Coordinator [phone]

## 2023-05-05 ENCOUNTER — Ambulatory Visit
Admission: RE | Admit: 2023-05-05 | Discharge: 2023-05-05 | Disposition: A | Discharge status: Home / Self Care | Payer: PPO | Source: Ambulatory Visit | Attending: Emergency Medicine

## 2023-05-05 DIAGNOSIS — Z853 Personal history of malignant neoplasm of breast: Secondary | ICD-10-CM | POA: Diagnosis not present

## 2023-05-05 DIAGNOSIS — N6320 Unspecified lump in the left breast, unspecified quadrant: Secondary | ICD-10-CM

## 2023-05-05 DIAGNOSIS — M79604 Pain in right leg: Secondary | ICD-10-CM | POA: Diagnosis not present

## 2023-05-05 DIAGNOSIS — M79605 Pain in left leg: Secondary | ICD-10-CM | POA: Diagnosis not present

## 2023-05-05 DIAGNOSIS — R262 Difficulty in walking, not elsewhere classified: Secondary | ICD-10-CM | POA: Diagnosis not present

## 2023-05-05 DIAGNOSIS — M545 Low back pain, unspecified: Secondary | ICD-10-CM | POA: Diagnosis not present

## 2023-05-08 ENCOUNTER — Encounter: Payer: PPO | Attending: Internal Medicine | Admitting: Nutrition

## 2023-05-08 DIAGNOSIS — E1165 Type 2 diabetes mellitus with hyperglycemia: Secondary | ICD-10-CM | POA: Insufficient documentation

## 2023-05-08 DIAGNOSIS — Z794 Long term (current) use of insulin: Secondary | ICD-10-CM | POA: Insufficient documentation

## 2023-05-09 ENCOUNTER — Other Ambulatory Visit: Payer: Self-pay

## 2023-05-09 ENCOUNTER — Telehealth: Payer: Self-pay | Admitting: Nutrition

## 2023-05-09 DIAGNOSIS — M545 Low back pain, unspecified: Secondary | ICD-10-CM | POA: Diagnosis not present

## 2023-05-09 DIAGNOSIS — R262 Difficulty in walking, not elsewhere classified: Secondary | ICD-10-CM | POA: Diagnosis not present

## 2023-05-09 DIAGNOSIS — M79605 Pain in left leg: Secondary | ICD-10-CM | POA: Diagnosis not present

## 2023-05-09 DIAGNOSIS — M79604 Pain in right leg: Secondary | ICD-10-CM | POA: Diagnosis not present

## 2023-05-09 MED ORDER — DEXCOM G7 SENSOR MISC: 1.0000 | 3 refills | Status: DC

## 2023-05-09 NOTE — Telephone Encounter (Signed)
OmniPod pump does not use Libre.  Please  cancel [name] script and order dexcom G7.  2 samples given yesterday at pump training

## 2023-05-09 NOTE — Telephone Encounter (Signed)
Patient reported no difficulty  with using her new PDM or giving boluses.  Says morning blood sugar was 142.  She reports giving a bolus for all meals and snacks and putting in the blood sugar reading.  She had no questions for me.

## 2023-05-09 NOTE — Progress Notes (Unsigned)
Patient came in saying she has not been on her pump for 3 days.  She says she ran out of dash pods.  Her blood sugar was 237 fasting.  She was trained on the use of the OmniPod 5 pump.  Settings were transferred from her PDM and checked with last office note.  Basal rate:  MN: 2.7u/hr,  8AM:3.1u/hr,   I/C: 1, Pt. To put in  12 u for breakfast and  14 u for lunch and supper.   ISF: 20,  Target: 120 with correction over 120, max bolus 30, timing 4 hours.   She was trained on the use of the Dexcom G7 and given 2 samples until her script is autorized  Lot: [id number]  exp.  04/12/24.  She inserted the sensor into her right upper outer arm and this was linked to her PDM and the phone app was linked to [facility] and to [place] [facility].  Her pump was also linked to [facility] and to [place] [facility].   She filled a pod with Novolog insulin and we discussed proper placement for where to put this, with reference to her sensor placement.  She started the pod and the blood sugar was linked to the PDM and to the pod.  She put the pump in the automated mode, and we discussed how this will work for her after 4-5 days. She was shown how to bolus, and when and how to do corrections doses.  She has never given a bolus if her blood sugar is below 100.  We discussed the need to bolus and how this pump will reduce the meal time insulin dose for her as long as she enters the blood sugar reading for every meal bolus.  She agreed to do this.  We also discussed how and when needs to do correction doses.  She did a correction dose per pump, at this time.  She was also not giving boluses for a HS snack she is eating.  She was told to give 2-4 units for snacks, as directed by Dr. Lonzo Cloud, and was shown how She agreed to do this and signed the checklist as understanding all topics with no final questions.

## 2023-05-09 NOTE — Patient Instructions (Signed)
Read over starter booklet and call OmniPod if questions on how to use this pump Bolus for all meals and snacks eaten, making sure to put the blood sugar reading into each bolus calculation.

## 2023-05-15 ENCOUNTER — Telehealth: Payer: Self-pay

## 2023-05-15 NOTE — Telephone Encounter (Signed)
Pharmacy Patient Advocate Encounter   Received notification from Patient Advice Request messages that prior authorization for Dexcom G7 sensor is required/requested.   Insurance verification completed.   The patient is insured through [facility] [employer] .   Per test claim: PA required; PA submitted to above mentioned insurance via CoverMyMeds Key/confirmation #/EOC [id number] Status is pending

## 2023-05-16 ENCOUNTER — Ambulatory Visit: Payer: PPO | Admitting: Nutrition

## 2023-05-16 ENCOUNTER — Ambulatory Visit: Payer: PPO | Admitting: Emergency Medicine

## 2023-05-16 ENCOUNTER — Encounter: Payer: Self-pay | Admitting: Emergency Medicine

## 2023-05-16 VITALS — BP 148/90 | HR 92 | Temp 98.3°F | Ht 64.0 in | Wt 254.0 lb

## 2023-05-16 DIAGNOSIS — J449 Chronic obstructive pulmonary disease, unspecified: Secondary | ICD-10-CM

## 2023-05-16 DIAGNOSIS — G894 Chronic pain syndrome: Secondary | ICD-10-CM | POA: Diagnosis not present

## 2023-05-16 DIAGNOSIS — E1169 Type 2 diabetes mellitus with other specified complication: Secondary | ICD-10-CM | POA: Diagnosis not present

## 2023-05-16 DIAGNOSIS — M545 Low back pain, unspecified: Secondary | ICD-10-CM | POA: Diagnosis not present

## 2023-05-16 DIAGNOSIS — E1159 Type 2 diabetes mellitus with other circulatory complications: Secondary | ICD-10-CM

## 2023-05-16 DIAGNOSIS — G4733 Obstructive sleep apnea (adult) (pediatric): Secondary | ICD-10-CM

## 2023-05-16 DIAGNOSIS — I152 Hypertension secondary to endocrine disorders: Secondary | ICD-10-CM

## 2023-05-16 DIAGNOSIS — R262 Difficulty in walking, not elsewhere classified: Secondary | ICD-10-CM | POA: Diagnosis not present

## 2023-05-16 DIAGNOSIS — E785 Hyperlipidemia, unspecified: Secondary | ICD-10-CM | POA: Diagnosis not present

## 2023-05-16 DIAGNOSIS — M79604 Pain in right leg: Secondary | ICD-10-CM | POA: Diagnosis not present

## 2023-05-16 DIAGNOSIS — M79605 Pain in left leg: Secondary | ICD-10-CM | POA: Diagnosis not present

## 2023-05-16 NOTE — Assessment & Plan Note (Signed)
Stable.  Continue Symbicort twice a day and albuterol as needed

## 2023-05-16 NOTE — Assessment & Plan Note (Signed)
Diet and nutrition discussed.  Advised to decrease amount of daily carbohydrate intake and daily calories and increase amount of plant-based protein in her diet.

## 2023-05-16 NOTE — Assessment & Plan Note (Signed)
Stable.  Follows up with pain management clinic on a regular basis On long-term opiate use

## 2023-05-16 NOTE — Progress Notes (Signed)
[name] 62 y.o.   Chief Complaint  Patient presents with   Follow-up    3 month f/u for DM. Pt c/o lower back pain that's been going on for 2 weeks     HISTORY OF PRESENT ILLNESS: [name] here for 9-month follow-up of chronic medical conditions History of diabetes.  Sees endocrinologist on a regular basis.  Last saw endocrinologist 2 weeks ago.  Insulin-dependent diabetes.  Also on metformin and Mounjaro Overall doing well.  Has no complaints or medical concerns today. Lab Results  Component Value Date   HGBA1C 9.0 (A) 03/10/2023   BP Readings from Last 3 Encounters:  04/06/23 (!) 160/90  03/10/23 134/76  03/03/23 (!) 170/90   Wt Readings from Last 3 Encounters:  05/16/23 254 lb (115.2 kg)  04/06/23 252 lb 2 oz (114.4 kg)  03/10/23 252 lb (114.3 kg)     HPI   Prior to Admission medications   Medication Sig Start Date End Date Taking? Authorizing Provider  acyclovir (ZOVIRAX) 400 MG tablet Take 1 tablet (400 mg total) by mouth 4 (four) times daily. 05/30/22  Yes Trevian Hayashida, Eilleen Kempf, MD  albuterol (VENTOLIN HFA) 108 (90 Base) MCG/ACT inhaler Inhale 2 puffs into the lungs every 6 (six) hours as needed for wheezing or shortness of breath. 05/28/20  Yes Young, Joni Fears D, MD  Cholecalciferol (VITAMIN D3 PO) Take 1 tablet by mouth daily.   Yes [provider]  Continuous Glucose Monitor DEVI Use as directed. 03/28/17  Yes John Giovanni, MD  Continuous Glucose Sensor (DEXCOM G7 SENSOR) MISC 1 Device by Does not apply route as directed. 05/09/23  Yes Shamleffer, Konrad Dolores, MD  Cyanocobalamin (B-12 PO) Take 1 tablet by mouth daily.   Yes [provider]  dapagliflozin propanediol (FARXIGA) 10 MG TABS tablet Take 1 tablet (10 mg total) by mouth daily. 09/06/22  Yes Shamleffer, Konrad Dolores, MD  diclofenac Sodium (VOLTAREN) 1 % GEL Apply 2 g topically 4 (four) times daily. Rub into affected area of foot 2 to 4 times daily Patient  taking differently: Apply 2 g topically 4 (four) times daily as needed (pain). 01/14/22  Yes Vivi Barrack, DPM  dicyclomine (BENTYL) 10 MG capsule TAKE 1 CAPSULE BY MOUTH THREE TIMES DAILY BEFORE MEAL(S) 02/04/22  Yes Esterwood, Amy S, PA-C  esomeprazole (NEXIUM) 40 MG capsule Take 1 capsule (40 mg total) by mouth daily. 04/03/23  Yes Meryl Dare, MD  ezetimibe (ZETIA) 10 MG tablet Take 1 tablet (10 mg total) by mouth daily. 03/10/23 03/09/24 Yes Shamleffer, Konrad Dolores, MD  famotidine (PEPCID) 20 MG tablet Take 1 tablet (20 mg total) by mouth 2 (two) times daily as needed. 02/04/22  Yes Esterwood, Amy S, PA-C  insulin aspart (NOVOLOG) 100 UNIT/ML injection MAX DAILY 120 UNITS  DX E11.65 03/10/23  Yes Shamleffer, Konrad Dolores, MD  letrozole [facility]) 2.5 MG tablet Take 1 tablet by mouth once daily 06/20/22  Yes Serena Croissant, MD  lidocaine (LIDODERM) 5 % Place 1 patch onto the skin daily. Remove & Discard patch within 12 hours or as directed by MD 10/22/22  Yes Wynetta Fines, MD  metFORMIN (GLUCOPHAGE) 1000 MG tablet TAKE 1 TABLET BY MOUTH TWICE DAILY WITH A MEAL 02/23/23  Yes Shamleffer, Konrad Dolores, MD  Misc Natural Products (ELDERBERRY IMMUNE COMPLEX) CHEW Chew 1 tablet by mouth daily.   Yes [provider]  montelukast (SINGULAIR) 10 MG tablet TAKE 1 TABLET BY MOUTH AT BEDTIME 03/01/23  Yes Glenford Bayley, NP  Multiple Vitamin (MULTIVITAMIN WITH MINERALS) TABS tablet Take 1 tablet by mouth daily.   Yes [provider]  NARCAN 4 MG/0.1ML LIQD nasal spray kit Place 0.4 mg into the nose once. 12/06/18  Yes [provider]  oxyCODONE-acetaminophen (PERCOCET) 10-325 MG tablet Take 1 tablet by mouth every 8 (eight) hours as needed for pain. 03/02/20  Yes [provider]  pregabalin (LYRICA) 150 MG capsule Take 150 mg by mouth 2 (two) times daily. 03/03/23  Yes [provider]  rosuvastatin (CRESTOR) 10 MG tablet Take 1 tablet (10 mg total) by  mouth daily. 03/10/23  Yes Shamleffer, Konrad Dolores, MD  SYMBICORT 160-4.5 MCG/ACT inhaler Inhale 2 puffs by mouth twice daily 07/29/22  Yes Young, Clinton D, MD  tirzepatide [facility]) 2.5 MG/0.5ML Pen Inject 2.5 mg into the skin once a week. 03/10/23  Yes Shamleffer, Konrad Dolores, MD  tiZANidine (ZANAFLEX) 4 MG capsule Take 4 mg by mouth 3 (three) times daily as needed for muscle spasms. 07/23/21  Yes [provider]  valACYclovir (VALTREX) 500 MG tablet TAKE 1 TABLET BY MOUTH TWICE DAILY FOR 5 DAYS AT  TIME  OF  OUTBREAK 12/22/22  Yes Krissi Willaims, Eilleen Kempf, MD  valsartan (DIOVAN) 320 MG tablet Take 1 tablet (320 mg total) by mouth daily. 03/03/23  Yes Corwin Levins, MD  Vitamin D, Ergocalciferol, (DRISDOL) 1.25 MG (50000 UNIT) CAPS capsule Take 50,000 Units by mouth once a week. 03/03/23  Yes [provider]  cephALEXin (KEFLEX) 500 MG capsule Take 1 capsule (500 mg total) by mouth 3 (three) times daily. Patient not taking: Reported on 03/29/2023 03/05/23   Corwin Levins, MD  Continuous Glucose Sensor (FREESTYLE LIBRE 2 SENSOR) MISC Change sensors every 14 days 03/13/23   Shamleffer, Konrad Dolores, MD  fluconazole (DIFLUCAN) 150 MG tablet Sig one (1) tablet daily for 5 days. 04/06/23   Georgina Quint, MD  glucose blood (FREESTYLE TEST STRIPS) test strip 1 each by Other route 3 (three) times daily. 09/06/21   Shamleffer, Konrad Dolores, MD  Insulin Disposable Pump (OMNIPOD 5 G7 INTRO, GEN 5,) KIT 1 Device by Does not apply route every other day. 03/10/23   Shamleffer, Konrad Dolores, MD  Insulin Disposable Pump (OMNIPOD 5 G7 PODS, GEN 5,) MISC 1 Device by Does not apply route every other day. 03/10/23   Shamleffer, Konrad Dolores, MD    Allergies  Allergen Reactions   Earlie Server [Fluticasone Furoate-Vilanterol] Other (See Comments)    Urinary retention   Hydrochlorothiazide     Hypokalemia    Lisinopril Cough    Patient Active Problem List   Diagnosis Date Noted    Dyslipidemia 03/10/2023   Type 2 diabetes mellitus with hyperglycemia, with long-term current use of insulin (HCC) 03/10/2023   Dysuria 03/04/2023   Yeast vaginitis 03/04/2023   Discoloration of skin 02/14/2023   Chronic pain of left ankle 02/14/2023   Lumbar degenerative disc disease 10/27/2022   Spinal stenosis of lumbar region without neurogenic claudication 10/27/2022   Osteoarthritis of spine with radiculopathy, lumbar region 10/27/2022   Neck pain on left side 09/28/2022   Soft tissue mass 09/28/2022   History of colonic polyps 05/19/2022   Benign neoplasm of ascending colon 05/19/2022   Benign neoplasm of transverse colon 05/19/2022   Malignant neoplasm of upper-outer quadrant of right breast in female, estrogen receptor positive (HCC) 02/22/2021   Body mass index (BMI) 40.0-44.9, adult (HCC) 07/28/2020   Lumbago with sciatica, right  side 07/28/2020   Dyspnea on exertion 07/11/2020   OSA on CPAP 10/31/2018   Stress incontinence in female 03/09/2017   Hepatic steatosis 08/29/2016   Herpes simplex 03/08/2016   Diabetic neuropathy with neurologic complication (HCC) 10/20/2015   Long term current use of opiate analgesic 07/21/2015   COPD GOLD II  05/31/2013   Lung nodule < 6cm on CT 05/31/2013   Chronic cough 12/07/2012   Chronic pain syndrome 02/13/2012   Lumbar radiculopathy 11/29/2011   Type 2 diabetes mellitus with diabetic neuropathy (HCC) 08/25/2011   Back pain 11/15/2010   Hypertension associated with diabetes (HCC) 07/07/2009   GERD 01/08/2009   Dyslipidemia associated with type 2 diabetes mellitus (HCC) 12/09/2008   Hyperlipidemia 12/09/2008   Morbid obesity due to excess calories (HCC) 12/09/2008    Past Medical History:  Diagnosis Date   Allergy    Arthritis    back    Asthma    AS CHILD   Cancer (HCC)    breast CA- Right,   Chronic back pain    Chronic leg pain    due to back pain   Cocaine abuse (HCC)    in remission   COPD (chronic obstructive  pulmonary disease) (HCC)    Diabetes (HCC)    with neuropathy   GERD (gastroesophageal reflux disease)    Hyperlipidemia    Hypertension    Internal hemorrhoids    Intraductal papilloma of left breast 02/18/2016   Neuromuscular disorder (HCC)    neuropathy   Personal history of radiation therapy    Postlaminectomy syndrome of lumbar region 12/07/2011   RECTAL BLEEDING 12/09/2008   Annotation: s/p EGD 7/08 mild gastritis, s/p colonoscopy 7/08- benign polyp  s/p polypectomy and isolated diverticulum.  Qualifier: Diagnosis of  By: Ditzler RN, Debra     Sciatica    per patient    Sleep apnea    CPAP    YEARS AGO DONE 1/2 YEARS AGO AND WAS TOLD DID NOT HAVE   Tobacco abuse    Tubular adenoma of colon    Uterine fibroid    s/p hysterectomy    Past Surgical History:  Procedure Laterality Date   BACK SURGERY  2012   L5-S1 microendoscopic disectomy last surgery 06/2011   BREAST BIOPSY     LEFT    01/19/16   BREAST BIOPSY Right 02/16/2021   BREAST EXCISIONAL BIOPSY Left 02/2016   BREAST LUMPECTOMY Right 03/12/2021   BREAST LUMPECTOMY WITH RADIOACTIVE SEED AND SENTINEL LYMPH NODE BIOPSY Right 03/12/2021   Procedure: RIGHT BREAST LUMPECTOMY WITH RADIOACTIVE SEED AND SENTINEL LYMPH NODE BIOPSY;  Surgeon: Griselda Miner, MD;  Location: [facility] OR;  Service: General;  Laterality: Right;   BREAST REDUCTION SURGERY  1982   CATARACT EXTRACTION Right 11/07/2019   COLONOSCOPY     COLONOSCOPY WITH PROPOFOL N/A 05/19/2022   Procedure: COLONOSCOPY WITH PROPOFOL;  Surgeon: Meryl Dare, MD;  Location: [facility];  Service: Gastroenterology;  Laterality: N/A;   HAND SURGERY     MIDDLE TRIGGER FINGER RIGHT SIDE   POLYPECTOMY  05/19/2022   Procedure: POLYPECTOMY;  Surgeon: Meryl Dare, MD;  Location: [facility];  Service: Gastroenterology;;   RADIOACTIVE SEED GUIDED EXCISIONAL BREAST BIOPSY Left 02/18/2016   Procedure: LEFT RADIOACTIVE SEED GUIDED EXCISIONAL BREAST BIOPSY;  Surgeon: Ovidio Kin, MD;  Location: [facility] OR;  Service: General;  Laterality: Left;   REDUCTION MAMMAPLASTY Bilateral    TONSILLECTOMY  2008   TOTAL ABDOMINAL HYSTERECTOMY  05/24/2007  hysterectomy   TUBAL LIGATION     UPPER GASTROINTESTINAL ENDOSCOPY      Social History   Socioeconomic History   Marital status: Married    Spouse name: Not on file   Number of children: 3   Years of education: Not on file   Highest education level: Some college, no degree  Occupational History    Comment: [employer]  Tobacco Use   Smoking status: Former    Current packs/day: 0.00    Average packs/day: 0.5 packs/day for 20.0 years (10.0 ttl pk-yrs)    Types: Cigarettes    Start date: 07/23/1976    Quit date: 07/23/1996    Years since quitting: 26.8   Smokeless tobacco: Never  Vaping Use   Vaping status: Never Used  Substance and Sexual Activity   Alcohol use: Not Currently    Comment: recovering addict clean for 9 years   Drug use: Not Currently    Types: Cocaine, Marijuana    Comment: recovering addict clean for 16 years   Sexual activity: Yes    Birth control/protection: Surgical  Other Topics Concern   Not on file  Social History Narrative   Current Social History 01/17/2020        Patient lives with spouse in a home which is 1 story. There are not steps up to the entrance the patient uses.       Patient's method of transportation is personal car.      The highest level of education was some college.      The patient currently works part-time.      Identified important Relationships are God, husband, kids, grandkids       Pets : None       Interests / Fun: Drawing and nature       Current Stressors: Pain, Covid       Religious / Personal Beliefs: Christian       Other: None    Social Determinants of Health   Financial Resource Strain: Low Risk  (05/15/2023)   Overall Financial Resource Strain (CARDIA)    Difficulty of Paying Living Expenses: Not hard at all  Food Insecurity:  No Food Insecurity (05/15/2023)   Hunger Vital Sign    Worried About Running Out of Food in the Last Year: Never true    Ran Out of Food in the Last Year: Never true  Transportation Needs: No Transportation Needs (05/15/2023)   PRAPARE - Administrator, Civil Service (Medical): No    Lack of Transportation (Non-Medical): No  Physical Activity: Insufficiently Active (05/15/2023)   Exercise Vital Sign    Days of Exercise per Week: 2 days    Minutes of Exercise per Session: 40 min  Stress: No Stress Concern Present (05/15/2023)   [employer] of Occupational Health - Occupational Stress Questionnaire    Feeling of Stress : Only a little  Social Connections: Socially Integrated (05/15/2023)   Social Connection and Isolation Panel [NHANES]    Frequency of Communication with Friends and Family: More than three times a week    Frequency of Social Gatherings with Friends and Family: Once a week    Attends Religious Services: More than 4 times per year    Active Member of [employer] or Organizations: Yes    Attends Banker Meetings: More than 4 times per year    Marital Status: Married  Catering manager Violence: Not At Risk (02/18/2022)   Humiliation, Afraid, Rape, and Kick questionnaire  Fear of Current or Ex-Partner: No    Emotionally Abused: No    Physically Abused: No    Sexually Abused: No    Family History  Problem Relation Age of Onset   Diabetes Father    Heart disease Father    Hypertension Father    Diabetes Mother    Cancer Mother        brain   Hypertension Mother    Kidney disease Sister    Diabetes Sister    Kidney cancer Sister    Stroke Sister    Diabetes Sister    Hypertension Sister    Diabetes Sister    Hypertension Sister    Obesity Son    Colon cancer Neg Hx    Colon polyps Neg Hx    Esophageal cancer Neg Hx    Rectal cancer Neg Hx    Stomach cancer Neg Hx      Review of Systems  Constitutional: Negative.  Negative for chills  and fever.  HENT: Negative.  Negative for congestion and sore throat.   Respiratory: Negative.  Negative for cough and shortness of breath.   Cardiovascular: Negative.  Negative for chest pain and palpitations.  Gastrointestinal:  Negative for abdominal pain, diarrhea, nausea and vomiting.  Genitourinary: Negative.  Negative for dysuria and hematuria.  Skin: Negative.  Negative for rash.  Neurological: Negative.  Negative for dizziness and headaches.  All other systems reviewed and are negative.   Today's Vitals   05/16/23 1345  BP: (!) 148/90  Pulse: 92  Temp: 98.3 F (36.8 C)  TempSrc: Oral  SpO2: 91%  Weight: 254 lb (115.2 kg)  Height: 5\' 4"  (1.626 m)   Body mass index is 43.6 kg/m.   Physical Exam Vitals reviewed.  Constitutional:      Appearance: Normal appearance. She is obese.  HENT:     Head: Normocephalic.     Mouth/Throat:     Mouth: Mucous membranes are moist.     Pharynx: Oropharynx is clear.  Eyes:     Extraocular Movements: Extraocular movements intact.     Conjunctiva/sclera: Conjunctivae normal.     Pupils: Pupils are equal, round, and reactive to light.  Cardiovascular:     Rate and Rhythm: Normal rate and regular rhythm.     Pulses: Normal pulses.     Heart sounds: Normal heart sounds.  Pulmonary:     Effort: Pulmonary effort is normal.     Breath sounds: Normal breath sounds.  Abdominal:     Palpations: Abdomen is soft.     Tenderness: There is no abdominal tenderness.  Musculoskeletal:     Cervical back: No tenderness.  Lymphadenopathy:     Cervical: No cervical adenopathy.  Skin:    General: Skin is warm and dry.     Capillary Refill: Capillary refill takes less than 2 seconds.  Neurological:     General: No focal deficit present.     Mental Status: She is alert and oriented to person, place, and time.  Psychiatric:        Mood and Affect: Mood normal.        Behavior: Behavior normal.      ASSESSMENT & PLAN: A total of 46  minutes was spent with the patient and counseling/coordination of care regarding preparing for this visit, review of most recent office visit notes, review of multiple chronic medical conditions and their management, cardiovascular risks associated with hypertension and diabetes, review of all medications, review of most recent  bloodwork results, review of health maintenance items, education on nutrition, prognosis, documentation, and need for follow up. .  Problem List Items Addressed This Visit       Cardiovascular and Mediastinum   Hypertension associated with diabetes (HCC) - Primary    Elevated blood pressure reading in the office but normal readings at home. Advised to monitor blood pressure readings at home daily for the next several weeks and keep a log.  Advised to contact the office if numbers persistently abnormal. Continue valsartan 320 mg daily. Sees endocrinologist on a regular basis Insulin-dependent.  Also on metformin and Mounjaro.  Medications and doses managed by endocrinologist office. Cardiovascular risks associated with hypertension and diabetes discussed Diet and nutrition discussed.        Respiratory   COPD GOLD II     Stable. Continue Symbicort twice a day and albuterol as needed       OSA on CPAP    Compliant with CPAP        Endocrine   Dyslipidemia associated with type 2 diabetes mellitus (HCC)    Continues rosuvastatin 10 mg daily Diet and nutrition discussed        Other   Morbid obesity due to excess calories (HCC)    Diet and nutrition discussed Advised to decrease amount of daily carbohydrate intake and daily calories and increase amount of plant-based protein in her diet      Chronic pain syndrome    Stable.  Follows up with pain management clinic on a regular basis On long-term opiate use        Patient Instructions  Health Maintenance, Female Adopting a healthy lifestyle and getting preventive care are important in promoting  health and wellness. Ask your health care provider about: The right schedule for you to have regular tests and exams. Things you can do on your own to prevent diseases and keep yourself healthy. What should I know about diet, weight, and exercise? Eat a healthy diet  Eat a diet that includes plenty of vegetables, fruits, low-fat dairy products, and lean protein. Do not eat a lot of foods that are high in solid fats, added sugars, or sodium. Maintain a healthy weight Body mass index (BMI) is used to identify weight problems. It estimates body fat based on height and weight. Your health care provider can help determine your BMI and help you achieve or maintain a healthy weight. Get regular exercise Get regular exercise. This is one of the most important things you can do for your health. Most adults should: Exercise for at least 150 minutes each week. The exercise should increase your heart rate and make you sweat (moderate-intensity exercise). Do strengthening exercises at least twice a week. This is in addition to the moderate-intensity exercise. Spend less time sitting. Even light physical activity can be beneficial. Watch cholesterol and blood lipids Have your blood tested for lipids and cholesterol at 62 years of age, then have this test every 5 years. Have your cholesterol levels checked more often if: Your lipid or cholesterol levels are high. You are older than 62 years of age. You are at high risk for heart disease. What should I know about cancer screening? Depending on your health history and family history, you may need to have cancer screening at various ages. This may include screening for: Breast cancer. Cervical cancer. Colorectal cancer. Skin cancer. Lung cancer. What should I know about heart disease, diabetes, and high blood pressure? Blood pressure and heart disease High blood  pressure causes heart disease and increases the risk of stroke. This is more likely to  develop in people who have high blood pressure readings or are overweight. Have your blood pressure checked: Every 3-5 years if you are 63-14 years of age. Every year if you are 42 years old or older. Diabetes Have regular diabetes screenings. This checks your fasting blood sugar level. Have the screening done: Once every three years after age 43 if you are at a normal weight and have a low risk for diabetes. More often and at a younger age if you are overweight or have a high risk for diabetes. What should I know about preventing infection? Hepatitis B If you have a higher risk for hepatitis B, you should be screened for this virus. Talk with your health care provider to find out if you are at risk for hepatitis B infection. Hepatitis C Testing is recommended for: Everyone born from [birth year] through 1965. Anyone with known risk factors for hepatitis C. Sexually transmitted infections (STIs) Get screened for STIs, including gonorrhea and chlamydia, if: You are sexually active and are younger than 62 years of age. You are older than 62 years of age and your health care provider tells you that you are at risk for this type of infection. Your sexual activity has changed since you were last screened, and you are at increased risk for chlamydia or gonorrhea. Ask your health care provider if you are at risk. Ask your health care provider about whether you are at high risk for HIV. Your health care provider may recommend a prescription medicine to help prevent HIV infection. If you choose to take medicine to prevent HIV, you should first get tested for HIV. You should then be tested every 3 months for as long as you are taking the medicine. Pregnancy If you are about to stop having your period (premenopausal) and you may become pregnant, seek counseling before you get pregnant. Take 400 to 800 micrograms (mcg) of folic acid every day if you become pregnant. Ask for birth control (contraception) if you  want to prevent pregnancy. Osteoporosis and menopause Osteoporosis is a disease in which the bones lose minerals and strength with aging. This can result in bone fractures. If you are 54 years old or older, or if you are at risk for osteoporosis and fractures, ask your health care provider if you should: Be screened for bone loss. Take a calcium or vitamin D supplement to lower your risk of fractures. Be given hormone replacement therapy (HRT) to treat symptoms of menopause. Follow these instructions at home: Alcohol use Do not drink alcohol if: Your health care provider tells you not to drink. You are pregnant, may be pregnant, or are planning to become pregnant. If you drink alcohol: Limit how much you have to: 0-1 drink a day. Know how much alcohol is in your drink. In the [place] one drink equals one 12 oz bottle of beer (355 mL), one 5 oz glass of wine (148 mL), or one 1 oz glass of hard liquor (44 mL). Lifestyle Do not use any products that contain nicotine or tobacco. These products include cigarettes, chewing tobacco, and vaping devices, such as e-cigarettes. If you need help quitting, ask your health care provider. Do not use street drugs. Do not share needles. Ask your health care provider for help if you need support or information about quitting drugs. General instructions Schedule regular health, dental, and eye exams. Stay current with your vaccines. Tell your  health care provider if: You often feel depressed. You have ever been abused or do not feel safe at home. Summary Adopting a healthy lifestyle and getting preventive care are important in promoting health and wellness. Follow your health care provider's instructions about healthy diet, exercising, and getting tested or screened for diseases. Follow your health care provider's instructions on monitoring your cholesterol and blood pressure. This information is not intended to replace advice given to you by your health  care provider. Make sure you discuss any questions you have with your health care provider. Document Revised: 10/19/2020 Document Reviewed: 10/19/2020 Elsevier Patient Education  2024 Elsevier Inc.    Edwina Barth, MD [facility]

## 2023-05-16 NOTE — Patient Instructions (Signed)

## 2023-05-16 NOTE — Assessment & Plan Note (Signed)
Continues rosuvastatin 10 mg daily Diet and nutrition discussed

## 2023-05-16 NOTE — Assessment & Plan Note (Addendum)
Elevated blood pressure reading in the office but normal readings at home. Advised to monitor blood pressure readings at home daily for the next several weeks and keep a log.  Advised to contact the office if numbers persistently abnormal. Continue valsartan 320 mg daily. Sees endocrinologist on a regular basis Insulin-dependent.  Also on metformin and Mounjaro.  Medications and doses managed by endocrinologist office. Cardiovascular risks associated with hypertension and diabetes discussed Diet and nutrition discussed.

## 2023-05-16 NOTE — Assessment & Plan Note (Signed)
Compliant with CPAP 

## 2023-05-19 NOTE — Telephone Encounter (Signed)
SO this needs to go to DME to be covered correct under part b

## 2023-05-19 NOTE — Telephone Encounter (Signed)
Pharmacy Patient Advocate Encounter  Received notification from [facility] that Prior Authorization for Dexcom G7 sensor has been Denied     PA #/Case ID/Reference #: [id number]

## 2023-05-23 DIAGNOSIS — M545 Low back pain, unspecified: Secondary | ICD-10-CM | POA: Diagnosis not present

## 2023-05-23 DIAGNOSIS — R262 Difficulty in walking, not elsewhere classified: Secondary | ICD-10-CM | POA: Diagnosis not present

## 2023-05-23 DIAGNOSIS — M79605 Pain in left leg: Secondary | ICD-10-CM | POA: Diagnosis not present

## 2023-05-23 DIAGNOSIS — M79604 Pain in right leg: Secondary | ICD-10-CM | POA: Diagnosis not present

## 2023-05-24 ENCOUNTER — Telehealth: Payer: Self-pay | Admitting: *Deleted

## 2023-05-24 NOTE — Progress Notes (Signed)
  Care Coordination Note  05/24/2023 Name: [name] MRN: [id number] DOB: Apr 14, 1961  [name] is a 61 y.o. year old female who is a primary care patient of Georgina Quint, MD and is actively engaged with the care management team. I reached out to [name] by phone today to assist with re-scheduling a follow up visit with the RN Case Manager  Follow up plan: Unsuccessful telephone outreach attempt made. A HIPAA compliant phone message was left for the patient providing contact information and requesting a return call.   Burman Nieves, CCMA Care Coordination Care Guide Direct Dial: [phone]

## 2023-05-26 DIAGNOSIS — E559 Vitamin D deficiency, unspecified: Secondary | ICD-10-CM | POA: Diagnosis not present

## 2023-05-26 DIAGNOSIS — M545 Low back pain, unspecified: Secondary | ICD-10-CM | POA: Diagnosis not present

## 2023-05-26 DIAGNOSIS — I10 Essential (primary) hypertension: Secondary | ICD-10-CM | POA: Diagnosis not present

## 2023-05-26 DIAGNOSIS — M79605 Pain in left leg: Secondary | ICD-10-CM | POA: Diagnosis not present

## 2023-05-26 DIAGNOSIS — M79604 Pain in right leg: Secondary | ICD-10-CM | POA: Diagnosis not present

## 2023-05-26 DIAGNOSIS — M51362 Other intervertebral disc degeneration, lumbar region with discogenic back pain and lower extremity pain: Secondary | ICD-10-CM | POA: Diagnosis not present

## 2023-05-26 DIAGNOSIS — E119 Type 2 diabetes mellitus without complications: Secondary | ICD-10-CM | POA: Diagnosis not present

## 2023-05-26 DIAGNOSIS — R262 Difficulty in walking, not elsewhere classified: Secondary | ICD-10-CM | POA: Diagnosis not present

## 2023-05-26 DIAGNOSIS — Z79899 Other long term (current) drug therapy: Secondary | ICD-10-CM | POA: Diagnosis not present

## 2023-05-26 DIAGNOSIS — R768 Other specified abnormal immunological findings in serum: Secondary | ICD-10-CM | POA: Diagnosis not present

## 2023-05-29 DIAGNOSIS — Z79899 Other long term (current) drug therapy: Secondary | ICD-10-CM | POA: Diagnosis not present

## 2023-05-29 DIAGNOSIS — E1165 Type 2 diabetes mellitus with hyperglycemia: Secondary | ICD-10-CM | POA: Diagnosis not present

## 2023-05-30 ENCOUNTER — Telehealth: Payer: Self-pay | Admitting: Nutrition

## 2023-05-30 DIAGNOSIS — R262 Difficulty in walking, not elsewhere classified: Secondary | ICD-10-CM | POA: Diagnosis not present

## 2023-05-30 DIAGNOSIS — M545 Low back pain, unspecified: Secondary | ICD-10-CM | POA: Diagnosis not present

## 2023-05-30 DIAGNOSIS — G4733 Obstructive sleep apnea (adult) (pediatric): Secondary | ICD-10-CM | POA: Diagnosis not present

## 2023-05-30 DIAGNOSIS — M79604 Pain in right leg: Secondary | ICD-10-CM | POA: Diagnosis not present

## 2023-05-30 DIAGNOSIS — M79605 Pain in left leg: Secondary | ICD-10-CM | POA: Diagnosis not present

## 2023-05-30 MED ORDER — LANTUS SOLOSTAR 100 UNIT/ML ~~LOC~~ SOPN: 70.0000 [IU] | PEN_INJECTOR | Freq: Every day | SUBCUTANEOUS | 0 refills | Status: DC

## 2023-05-30 MED ORDER — OMNIPOD 5 DEXG7G6 PODS GEN 5 MISC: 1.0000 | Freq: Every day | 2 refills | Status: DC

## 2023-05-30 NOTE — Patient Outreach (Signed)
  Care Coordination   Case Closure  Visit Note   05/30/2023 Name: [name] MRN: [id number] DOB: Aug 25, 1960  [name] is a 62 y.o. year old female who sees Sagardia, Eilleen Kempf, MD for primary care. No patient contact was made during this encounter.  RNCM received notification per care guide of unsuccessful outreach attempts, "We have been unable to make contact with the patient for follow up". Case closed.   Goals Addressed             This Visit's Progress    COMPLETED: Health Management       Interventions Today    Flowsheet Row Most Recent Value  General Interventions   General Interventions Discussed/Reviewed General Interventions Reviewed  [unable to make contact with the patient for follow up.  Case closed]            SDOH assessments and interventions completed:  No  Care Coordination Interventions:  Yes, provided   Follow up plan: No further intervention required.   Encounter Outcome:  Patient Visit Completed   Kathyrn Sheriff, RN, MSN, BSN, CCM Care Management Coordinator [phone]

## 2023-05-30 NOTE — Telephone Encounter (Signed)
Patient called saying she has run out of pods and can not get any more for 3 more days.  She has no insulin at home except the Novolog in a vial, with no way to take this insulin. Patient is asking for you to please call in for more pods/month as well as some insulin for her to take for the next 3 days--to  [facility] pharmacy on  [address].

## 2023-05-30 NOTE — Telephone Encounter (Signed)
  Care Coordination Note  05/30/2023 Name: PARNEET CHAVANA MRN: [id number] DOB: [birth year]  [name] is a 62 y.o. year old female who is a primary care patient of Georgina Quint, MD and is actively engaged with the care management team. I reached out to [name] by phone today to assist with re-scheduling a follow up visit with the RN Case Manager  Follow up plan: We have been unable to make contact with the patient for follow up.   Burman Nieves, CCMA Care Coordination Care Guide Direct Dial: [phone]

## 2023-05-31 NOTE — Telephone Encounter (Signed)
Copied from CRM [id number]. Topic: Clinical - Medical Advice >> May 31, 2023  8:26 AM [name] wrote: Reason for CRM: PT is having symptoms of a UTI and is requesting medication from Dr. Alvy Bimler as PT states she is very sore and uncomfortable.

## 2023-06-01 ENCOUNTER — Telehealth: Payer: Self-pay

## 2023-06-01 NOTE — Telephone Encounter (Signed)
Patient came in today and picked up 2 samples of Dexcom G7 Sensors.  Patient states that she had wanted A pod, but we do not have samples of pods.

## 2023-06-01 NOTE — Telephone Encounter (Signed)
Send to endocrinologist who is handling her diabetes and diabetes pump.

## 2023-06-01 NOTE — Telephone Encounter (Signed)
Copied from CRM [id number]. Topic: Clinical - Medical Advice >> Jun 01, 2023  9:15 AM Isabell A wrote: Reason for CRM: Patient would like to speak to Dr.Sagardia's nurse in regard to her current symptoms - Patient is experiencing soreness with a breakout below, patient thinks it is a yeast infection.   Callback number: [phone]

## 2023-06-02 DIAGNOSIS — M79605 Pain in left leg: Secondary | ICD-10-CM | POA: Diagnosis not present

## 2023-06-02 DIAGNOSIS — M545 Low back pain, unspecified: Secondary | ICD-10-CM | POA: Diagnosis not present

## 2023-06-02 DIAGNOSIS — M79604 Pain in right leg: Secondary | ICD-10-CM | POA: Diagnosis not present

## 2023-06-02 DIAGNOSIS — R262 Difficulty in walking, not elsewhere classified: Secondary | ICD-10-CM | POA: Diagnosis not present

## 2023-06-03 ENCOUNTER — Other Ambulatory Visit: Payer: Self-pay | Admitting: Hematology and Oncology

## 2023-06-05 ENCOUNTER — Encounter: Payer: Self-pay | Admitting: Radiology

## 2023-06-05 ENCOUNTER — Ambulatory Visit: Payer: Self-pay | Admitting: Emergency Medicine

## 2023-06-05 DIAGNOSIS — R262 Difficulty in walking, not elsewhere classified: Secondary | ICD-10-CM | POA: Diagnosis not present

## 2023-06-05 DIAGNOSIS — M545 Low back pain, unspecified: Secondary | ICD-10-CM | POA: Diagnosis not present

## 2023-06-05 DIAGNOSIS — M79604 Pain in right leg: Secondary | ICD-10-CM | POA: Diagnosis not present

## 2023-06-05 DIAGNOSIS — M79605 Pain in left leg: Secondary | ICD-10-CM | POA: Diagnosis not present

## 2023-06-05 NOTE — Telephone Encounter (Signed)
Copied from CRM [id number]. Topic: Clinical - Red Word Triage >> Jun 05, 2023  4:00 PM Isabell A wrote: Red Word that prompted transfer to Nurse Triage: Severe pain due to UTI  Chief Complaint: possible uti Symptoms: frequency, sore and sores in vaginal area Frequency: constant Pertinent Negatives: Patient denies fever, Disposition: [] ED /[x] Urgent Care (no appt availability in office) / [] Appointment(In office/virtual)/ []  [facility]/ [] Home Care/ [] Refused Recommended Disposition /[] [facility]/ []  Follow-up with PCP Additional Notes: patient called in with c/o frequent urination and soreness to vaginal area.  States her husband saw sores in the area.  Patient made apt. For Friday to see pcp.  Instructed patient to go to UC and to ER if becomes worse.   Reason for Disposition  Urinating more frequently than usual (i.e., frequency)  Answer Assessment - Initial Assessment Questions 1. SYMPTOM: "What's the main symptom you're concerned about?" (e.g., frequency, incontinence)     Frequency with urination  2. ONSET: "When did the  pain  start?"     Last time I was seen 3. PAIN: "Is there any pain?" If Yes, ask: "How bad is it?" (Scale: 1-10; mild, moderate, severe)     Very sore down there 4. CAUSE: "What do you think is causing the symptoms?"    UTI 5. OTHER SYMPTOMS: "Do you have any other symptoms?" (e.g., blood in urine, fever, flank pain, pain with urination)     Sores/bumps in the vaginal area  Protocols used: Urinary Symptoms-A-AH

## 2023-06-09 ENCOUNTER — Other Ambulatory Visit (HOSPITAL_COMMUNITY)
Admission: RE | Admit: 2023-06-09 | Discharge: 2023-06-09 | Disposition: A | Discharge status: Home / Self Care | Payer: PPO | Source: Ambulatory Visit | Attending: Internal Medicine | Admitting: Internal Medicine

## 2023-06-09 ENCOUNTER — Encounter: Payer: Self-pay | Admitting: Internal Medicine

## 2023-06-09 ENCOUNTER — Ambulatory Visit (INDEPENDENT_AMBULATORY_CARE_PROVIDER_SITE_OTHER): Payer: PPO | Admitting: Internal Medicine

## 2023-06-09 VITALS — BP 138/80 | HR 79 | Temp 98.6°F | Resp 16 | Ht 64.0 in | Wt 250.8 lb

## 2023-06-09 DIAGNOSIS — Z0001 Encounter for general adult medical examination with abnormal findings: Secondary | ICD-10-CM

## 2023-06-09 DIAGNOSIS — R10817 Generalized abdominal tenderness: Secondary | ICD-10-CM

## 2023-06-09 DIAGNOSIS — E785 Hyperlipidemia, unspecified: Secondary | ICD-10-CM

## 2023-06-09 DIAGNOSIS — I152 Hypertension secondary to endocrine disorders: Secondary | ICD-10-CM | POA: Diagnosis not present

## 2023-06-09 DIAGNOSIS — Z794 Long term (current) use of insulin: Secondary | ICD-10-CM

## 2023-06-09 DIAGNOSIS — N939 Abnormal uterine and vaginal bleeding, unspecified: Secondary | ICD-10-CM | POA: Insufficient documentation

## 2023-06-09 DIAGNOSIS — K760 Fatty (change of) liver, not elsewhere classified: Secondary | ICD-10-CM

## 2023-06-09 DIAGNOSIS — Z124 Encounter for screening for malignant neoplasm of cervix: Secondary | ICD-10-CM | POA: Insufficient documentation

## 2023-06-09 DIAGNOSIS — E1159 Type 2 diabetes mellitus with other circulatory complications: Secondary | ICD-10-CM

## 2023-06-09 DIAGNOSIS — R3129 Other microscopic hematuria: Secondary | ICD-10-CM | POA: Diagnosis not present

## 2023-06-09 DIAGNOSIS — R748 Abnormal levels of other serum enzymes: Secondary | ICD-10-CM

## 2023-06-09 DIAGNOSIS — E1165 Type 2 diabetes mellitus with hyperglycemia: Secondary | ICD-10-CM | POA: Diagnosis not present

## 2023-06-09 DIAGNOSIS — A6004 Herpesviral vulvovaginitis: Secondary | ICD-10-CM

## 2023-06-09 DIAGNOSIS — E1169 Type 2 diabetes mellitus with other specified complication: Secondary | ICD-10-CM

## 2023-06-09 DIAGNOSIS — E1140 Type 2 diabetes mellitus with diabetic neuropathy, unspecified: Secondary | ICD-10-CM

## 2023-06-09 DIAGNOSIS — R300 Dysuria: Secondary | ICD-10-CM | POA: Insufficient documentation

## 2023-06-09 DIAGNOSIS — N952 Postmenopausal atrophic vaginitis: Secondary | ICD-10-CM | POA: Insufficient documentation

## 2023-06-09 LAB — HEPATIC FUNCTION PANEL
ALT: 25 U/L (ref 0–35)
AST: 22 U/L (ref 0–37)
Albumin: 4.2 g/dL (ref 3.5–5.2)
Alkaline Phosphatase: 105 U/L (ref 39–117)
Bilirubin, Direct: 0.1 mg/dL (ref 0.0–0.3)
Total Bilirubin: 0.5 mg/dL (ref 0.2–1.2)
Total Protein: 7.4 g/dL (ref 6.0–8.3)

## 2023-06-09 LAB — URINALYSIS, ROUTINE W REFLEX MICROSCOPIC
Bilirubin Urine: NEGATIVE
Ketones, ur: NEGATIVE
Leukocytes,Ua: NEGATIVE
Nitrite: NEGATIVE
Specific Gravity, Urine: 1.01 (ref 1.000–1.030)
Total Protein, Urine: NEGATIVE
Urine Glucose: >=1000 — AB
Urobilinogen, UA: 0.2 (ref 0.0–1.0)
pH: 5.5 (ref 5.0–8.0)

## 2023-06-09 LAB — CBC WITH DIFFERENTIAL/PLATELET
Basophils Absolute: 0 10*3/uL (ref 0.0–0.1)
Basophils Relative: 0.9 % (ref 0.0–3.0)
Eosinophils Absolute: 0.1 10*3/uL (ref 0.0–0.7)
Eosinophils Relative: 2.3 % (ref 0.0–5.0)
HCT: 42.5 % (ref 36.0–46.0)
Hemoglobin: 13.7 g/dL (ref 12.0–15.0)
Lymphocytes Relative: 32 % (ref 12.0–46.0)
Lymphs Abs: 1.9 10*3/uL (ref 0.7–4.0)
MCHC: 32.1 g/dL (ref 30.0–36.0)
MCV: 79.2 fL (ref 78.0–100.0)
Monocytes Absolute: 0.4 10*3/uL (ref 0.1–1.0)
Monocytes Relative: 7.6 % (ref 3.0–12.0)
Neutro Abs: 3.3 10*3/uL (ref 1.4–7.7)
Neutrophils Relative %: 57.2 % (ref 43.0–77.0)
Platelets: 209 10*3/uL (ref 150.0–400.0)
RBC: 5.36 Mil/uL — ABNORMAL HIGH (ref 3.87–5.11)
RDW: 15.8 % — ABNORMAL HIGH (ref 11.5–15.5)
WBC: 5.8 10*3/uL (ref 4.0–10.5)

## 2023-06-09 LAB — BASIC METABOLIC PANEL
BUN: 10 mg/dL (ref 6–23)
CO2: 28 meq/L (ref 19–32)
Calcium: 9.8 mg/dL (ref 8.4–10.5)
Chloride: 101 meq/L (ref 96–112)
Creatinine, Ser: 0.63 mg/dL (ref 0.40–1.20)
GFR: 94.8 mL/min (ref >60.00)
Glucose, Bld: 218 mg/dL — ABNORMAL HIGH (ref 70–99)
Potassium: 3.8 meq/L (ref 3.5–5.1)
Sodium: 139 meq/L (ref 135–145)

## 2023-06-09 LAB — MICROALBUMIN / CREATININE URINE RATIO
Creatinine,U: 34.9 mg/dL
Microalb Creat Ratio: 2.4 mg/g (ref 0.0–30.0)
Microalb, Ur: 0.8 mg/dL (ref 0.0–1.9)

## 2023-06-09 LAB — PROTIME-INR
INR: 1.2 {ratio} — ABNORMAL HIGH (ref 0.8–1.0)
Prothrombin Time: 12.4 s (ref 9.6–13.1)

## 2023-06-09 LAB — TSH: TSH: 1.14 u[IU]/mL (ref 0.35–5.50)

## 2023-06-09 LAB — LIPASE: Lipase: 189 U/L — ABNORMAL HIGH (ref 11.0–59.0)

## 2023-06-09 LAB — HEMOGLOBIN A1C: Hgb A1c MFr Bld: 10.2 % — ABNORMAL HIGH (ref 4.6–6.5)

## 2023-06-09 MED ORDER — VALACYCLOVIR HCL 500 MG PO TABS: ORAL_TABLET | ORAL | 3 refills | Status: DC

## 2023-06-09 MED ORDER — ESTRADIOL 0.1 MG/GM VA CREA: 1.0000 | TOPICAL_CREAM | Freq: Every day | VAGINAL | 0 refills | Status: DC

## 2023-06-09 NOTE — Patient Instructions (Signed)

## 2023-06-09 NOTE — Progress Notes (Unsigned)
Subjective:  Patient ID: [name], female    DOB: 1960/11/13  Age: 62 y.o. MRN: [id number]  CC: Hypertension, Hyperlipidemia, Diabetes, and Annual Exam   HPI [name] presents for a CPX and f/up ----  Discussed the use of AI scribe software for clinical note transcription with the patient, who gave verbal consent to proceed.  History of Present Illness   The patient presents with a two-week history of genitourinary discomfort, characterized by an internal burning sensation during urination and the presence of vaginal sores. The patient's spouse reportedly noticed these sores, which have been associated with peeling skin. The patient denies any vaginal discharge but reports occasional specks of red on wiping, suggestive of minor bleeding. The patient has a history of partial hysterectomy and genital herpes, but does not believe the current symptoms are related to a herpes outbreak. The patient also reports frequent urination but denies any difficulty in urination or hematuria. There are no associated symptoms of nausea, vomiting, fever, chills, chest pain, or shortness of breath. The patient has been using baby powder for local care but denies any itching or scratching in the area. The patient also reports some abdominal pain, with tenderness noted in a few areas on examination. The patient is currently on Valtrex, presumably for the management of genital herpes.       Outpatient Medications Prior to Visit  Medication Sig Dispense Refill   albuterol (VENTOLIN HFA) 108 (90 Base) MCG/ACT inhaler Inhale 2 puffs into the lungs every 6 (six) hours as needed for wheezing or shortness of breath. 18 g 12   Cholecalciferol (VITAMIN D3 PO) Take 1 tablet by mouth daily.     Continuous Glucose Monitor DEVI Use as directed. 1 each 0   Continuous Glucose Sensor (DEXCOM G7 SENSOR) MISC 1 Device by Does not apply route as directed. 9 each 3   Cyanocobalamin (B-12 PO) Take 1 tablet by mouth daily.      dapagliflozin propanediol (FARXIGA) 10 MG TABS tablet Take 1 tablet (10 mg total) by mouth daily. 90 tablet 3   diclofenac Sodium (VOLTAREN) 1 % GEL Apply 2 g topically 4 (four) times daily. Rub into affected area of foot 2 to 4 times daily (Patient taking differently: Apply 2 g topically 4 (four) times daily as needed (pain).) 100 g 2   dicyclomine (BENTYL) 10 MG capsule TAKE 1 CAPSULE BY MOUTH THREE TIMES DAILY BEFORE MEAL(S) 90 capsule 11   esomeprazole (NEXIUM) 40 MG capsule Take 1 capsule (40 mg total) by mouth daily. 30 capsule 3   ezetimibe (ZETIA) 10 MG tablet Take 1 tablet (10 mg total) by mouth daily. 90 tablet 3   famotidine (PEPCID) 20 MG tablet Take 1 tablet (20 mg total) by mouth 2 (two) times daily as needed. 60 tablet 11   insulin aspart (NOVOLOG) 100 UNIT/ML injection MAX DAILY 120 UNITS  DX E11.65 110 mL 3   Insulin Disposable Pump (OMNIPOD 5 DEXG7G6 PODS GEN 5) MISC 1 Device by Does not apply route daily. 90 each 2   insulin glargine (LANTUS SOLOSTAR) 100 UNIT/ML Solostar Pen Inject 70 Units into the skin daily. 15 mL 0   letrozole (FEMARA) 2.5 MG tablet Take 1 tablet by mouth once daily 90 tablet 0   lidocaine (LIDODERM) 5 % Place 1 patch onto the skin daily. Remove & Discard patch within 12 hours or as directed by MD 30 patch 0   metFORMIN (GLUCOPHAGE) 1000 MG tablet TAKE 1 TABLET BY MOUTH  TWICE DAILY WITH A MEAL 180 tablet 1   Misc Natural Products (ELDERBERRY IMMUNE COMPLEX) CHEW Chew 1 tablet by mouth daily.     montelukast (SINGULAIR) 10 MG tablet TAKE 1 TABLET BY MOUTH AT BEDTIME 90 tablet 3   Multiple Vitamin (MULTIVITAMIN WITH MINERALS) TABS tablet Take 1 tablet by mouth daily.     NARCAN 4 MG/0.1ML LIQD nasal spray kit Place 0.4 mg into the nose once.     oxyCODONE-acetaminophen (PERCOCET) 10-325 MG tablet Take 1 tablet by mouth every 8 (eight) hours as needed for pain.     pregabalin (LYRICA) 150 MG capsule Take 150 mg by mouth 2 (two) times daily.     rosuvastatin  (CRESTOR) 10 MG tablet Take 1 tablet (10 mg total) by mouth daily. 90 tablet 3   SYMBICORT 160-4.5 MCG/ACT inhaler Inhale 2 puffs by mouth twice daily 11 g 2   tirzepatide (MOUNJARO) 2.5 MG/0.5ML Pen Inject 2.5 mg into the skin once a week. 6 mL 2   tiZANidine (ZANAFLEX) 4 MG capsule Take 4 mg by mouth 3 (three) times daily as needed for muscle spasms.     valsartan (DIOVAN) 320 MG tablet Take 1 tablet (320 mg total) by mouth daily. 90 tablet 3   Vitamin D, Ergocalciferol, (DRISDOL) 1.25 MG (50000 UNIT) CAPS capsule Take 50,000 Units by mouth once a week.     acyclovir (ZOVIRAX) 400 MG tablet Take 1 tablet (400 mg total) by mouth 4 (four) times daily. 90 tablet 0   valACYclovir (VALTREX) 500 MG tablet TAKE 1 TABLET BY MOUTH TWICE DAILY FOR 5 DAYS AT  TIME  OF  OUTBREAK 20 tablet 3   No facility-administered medications prior to visit.    ROS Review of Systems  Constitutional: Negative.  Negative for appetite change, chills, diaphoresis, fatigue and fever.  HENT: Negative.    Eyes: Negative.   Respiratory: Negative.  Negative for cough, chest tightness, shortness of breath, wheezing and stridor.   Cardiovascular:  Negative for chest pain, palpitations and leg swelling.  Gastrointestinal:  Positive for abdominal pain. Negative for blood in stool, constipation, diarrhea, nausea and vomiting.  Genitourinary:  Positive for dysuria, hematuria and vaginal bleeding. Negative for decreased urine volume, difficulty urinating, flank pain, frequency, menstrual problem, urgency, vaginal discharge and vaginal pain.  Musculoskeletal:  Positive for arthralgias. Negative for back pain, joint swelling and myalgias.  Skin: Negative.  Negative for color change, pallor and rash.  Neurological: Negative.  Negative for dizziness, speech difficulty, weakness and light-headedness.  Hematological:  Negative for adenopathy. Does not bruise/bleed easily.  Psychiatric/Behavioral:  Positive for decreased concentration  and dysphoric mood. The patient is not nervous/anxious.     Objective:  BP 138/80 (BP Location: Left Arm, Patient Position: Sitting, Cuff Size: Large)   Pulse 79   Temp 98.6 F (37 C) (Oral)   Resp 16   Ht 5\' 4"  (1.626 m)   Wt 250 lb 12.8 oz (113.8 kg)   SpO2 94%   BMI 43.05 kg/m   BP Readings from Last 3 Encounters:  06/09/23 138/80  05/16/23 (!) 148/90  04/06/23 (!) 160/90    Wt Readings from Last 3 Encounters:  06/09/23 250 lb 12.8 oz (113.8 kg)  05/16/23 254 lb (115.2 kg)  04/06/23 252 lb 2 oz (114.4 kg)    Physical Exam Vitals reviewed. Exam conducted with a chaperone present [facility]).  Constitutional:      General: She is not in acute distress.    Appearance: She is  obese. She is not ill-appearing, toxic-appearing or diaphoretic.  HENT:     Mouth/Throat:     Mouth: Mucous membranes are moist.  Eyes:     General: No scleral icterus.    Conjunctiva/sclera: Conjunctivae normal.  Cardiovascular:     Rate and Rhythm: Normal rate and regular rhythm.     Heart sounds: No murmur heard.    No friction rub. No gallop.  Pulmonary:     Effort: Pulmonary effort is normal.     Breath sounds: No stridor. No wheezing, rhonchi or rales.  Abdominal:     General: Abdomen is protuberant. There is no distension.     Palpations: There is no hepatomegaly, splenomegaly or mass.     Tenderness: There is abdominal tenderness in the right upper quadrant, epigastric area and periumbilical area. There is left CVA tenderness. There is no right CVA tenderness, guarding or rebound.     Hernia: No hernia is present. There is no hernia in the left inguinal area or right inguinal area.  Genitourinary:    Exam position: Supine.     Pubic Area: No rash.      Labia:        Right: No rash, tenderness, lesion or injury.        Left: No rash, tenderness, lesion or injury.      Urethra: No prolapse, urethral pain, urethral swelling or urethral lesion.     Vagina: No signs of injury and foreign  body. No vaginal discharge, erythema, tenderness, bleeding, lesions or prolapsed vaginal walls.     Cervix: Normal.     Adnexa: Right adnexa normal.       Right: No mass, tenderness or fullness.         Left: No mass, tenderness or fullness.       Rectum: Normal. Guaiac result negative. No mass, tenderness, anal fissure, external hemorrhoid or internal hemorrhoid. Normal anal tone.     Comments: Labia are pale and atrophied Musculoskeletal:        General: Normal range of motion.     Cervical back: Neck supple.     Right lower leg: No edema.     Left lower leg: No edema.  Lymphadenopathy:     Cervical: No cervical adenopathy.     Lower Body: No right inguinal adenopathy. No left inguinal adenopathy.  Skin:    General: Skin is warm and dry.     Findings: No rash.  Neurological:     General: No focal deficit present.     Mental Status: She is alert. Mental status is at baseline.  Psychiatric:        Mood and Affect: Mood normal.        Behavior: Behavior normal.     Lab Results  Component Value Date   WBC 5.8 06/09/2023   HGB 13.7 06/09/2023   HCT 42.5 06/09/2023   PLT 209.0 06/09/2023   GLUCOSE 218 (H) 06/09/2023   CHOL 284 (H) 06/01/2022   TRIG 162.0 (H) 06/01/2022   HDL 44.90 06/01/2022   LDLCALC 207 (H) 06/01/2022   ALT 25 06/09/2023   AST 22 06/09/2023   NA 139 06/09/2023   K 3.8 06/09/2023   CL 101 06/09/2023   CREATININE 0.63 06/09/2023   BUN 10 06/09/2023   CO2 28 06/09/2023   TSH 1.14 06/09/2023   INR 1.2 (H) 06/09/2023   HGBA1C 10.2 (H) 06/09/2023   MICROALBUR 0.8 06/09/2023    MM 3D DIAGNOSTIC MAMMOGRAM BILATERAL BREAST Result  Date: 05/05/2023 CLINICAL DATA:  History of right breast cancer status post lumpectomy in September of 2022. EXAM: DIGITAL DIAGNOSTIC BILATERAL MAMMOGRAM WITH TOMOSYNTHESIS AND CAD TECHNIQUE: Bilateral digital diagnostic mammography and breast tomosynthesis was performed. The images were evaluated with computer-aided detection.  COMPARISON:  Previous exam(s). ACR Breast Density Category b: There are scattered areas of fibroglandular density. FINDINGS: Stable lumpectomy changes are seen in the right breast. No suspicious mass or malignant type microcalcifications identified in either breast. IMPRESSION: No evidence of malignancy in either breast. RECOMMENDATION: Per protocol, as the patient is now 2 or more years status post lumpectomy, she is eligible for annual screening or diagnostic mammography in 1 year. In our discussion, patient elects bilateral diagnostic mammogram in 1 year. I have discussed the findings and recommendations with the patient. If applicable, a reminder letter will be sent to the patient regarding the next appointment. BI-RADS CATEGORY  2: Benign. Electronically Signed   By: Baird Lyons M.D.   On: 05/05/2023 12:00    Assessment & Plan:  Dyslipidemia associated with type 2 diabetes mellitus (HCC) -     TSH; Future  Hypertension associated with diabetes (HCC)- Her BP is well controlled. -     Urinalysis, Routine w reflex microscopic; Future -     Basic metabolic panel; Future -     CBC with Differential/Platelet; Future -     TSH; Future  Type 2 diabetes mellitus with hyperglycemia, with long-term current use of insulin (HCC) -     Microalbumin / creatinine urine ratio; Future -     Basic metabolic panel; Future -     Hemoglobin A1c; Future -     AMB Referral VBCI Care Management  Dysuria -     Urinalysis, Routine w reflex microscopic; Future -     CULTURE, URINE COMPREHENSIVE; Future  Vaginal spotting -     Cytology - PAP  Hepatic steatosis -     Protime-INR; Future -     Hepatic function panel; Future  Generalized abdominal tenderness without rebound tenderness- Her lipase is elevated and there is hematuria. Will evaluate for pancreatitis and for renal pathology. -     Lipase; Future -     CT ABDOMEN PELVIS W CONTRAST; Future  Vaginal atrophy -     Cytology - PAP -     Estradiol;  Place 1 Applicatorful vaginally at bedtime.  Dispense: 42.5 g; Refill: 0  Cervical cancer screening -     Cytology - PAP  Other microscopic hematuria -     CT ABDOMEN PELVIS W CONTRAST; Future  Elevated lipase -     CT ABDOMEN PELVIS W CONTRAST; Future  Herpes simplex vulvovaginitis -     valACYclovir HCl; TAKE 1 TABLET BY MOUTH TWICE DAILY FOR 5 DAYS AT  TIME  OF  OUTBREAK  Dispense: 20 tablet; Refill: 3  Encounter for general adult medical examination with abnormal findings- Exam completed, labs reviewed, vaccines reviewed, cancer screenings addressed, pt ed material was given.      Follow-up: Return in about 3 months (around 09/07/2023).  Sanda Linger, MD

## 2023-06-11 LAB — CULTURE, URINE COMPREHENSIVE: RESULT:: NO GROWTH

## 2023-06-12 ENCOUNTER — Ambulatory Visit
Admission: RE | Admit: 2023-06-12 | Discharge: 2023-06-12 | Disposition: A | Discharge status: Home / Self Care | Payer: PPO | Source: Ambulatory Visit | Attending: Internal Medicine | Admitting: Internal Medicine

## 2023-06-12 ENCOUNTER — Other Ambulatory Visit: Payer: Self-pay

## 2023-06-12 DIAGNOSIS — K760 Fatty (change of) liver, not elsewhere classified: Secondary | ICD-10-CM | POA: Diagnosis not present

## 2023-06-12 DIAGNOSIS — R10817 Generalized abdominal tenderness: Secondary | ICD-10-CM

## 2023-06-12 DIAGNOSIS — R3129 Other microscopic hematuria: Secondary | ICD-10-CM

## 2023-06-12 DIAGNOSIS — Z853 Personal history of malignant neoplasm of breast: Secondary | ICD-10-CM | POA: Diagnosis not present

## 2023-06-12 DIAGNOSIS — R748 Abnormal levels of other serum enzymes: Secondary | ICD-10-CM

## 2023-06-12 DIAGNOSIS — R1030 Lower abdominal pain, unspecified: Secondary | ICD-10-CM | POA: Diagnosis not present

## 2023-06-12 MED ORDER — OMNIPOD 5 DEXG7G6 PODS GEN 5 MISC: 1.0000 | Freq: Every day | 2 refills | Status: DC

## 2023-06-12 MED ORDER — IOPAMIDOL (ISOVUE-300) INJECTION 61%
500.0000 mL | Freq: Once | INTRAVENOUS | Status: AC | PRN
  Administered 2023-06-12: 100 mL via INTRAVENOUS

## 2023-06-13 ENCOUNTER — Telehealth: Payer: Self-pay | Admitting: Nutrition

## 2023-06-13 ENCOUNTER — Other Ambulatory Visit: Payer: Self-pay | Admitting: Internal Medicine

## 2023-06-13 ENCOUNTER — Encounter: Payer: Self-pay | Admitting: Internal Medicine

## 2023-06-13 ENCOUNTER — Telehealth: Payer: Self-pay | Admitting: Pharmacist

## 2023-06-13 DIAGNOSIS — M79605 Pain in left leg: Secondary | ICD-10-CM | POA: Diagnosis not present

## 2023-06-13 DIAGNOSIS — R262 Difficulty in walking, not elsewhere classified: Secondary | ICD-10-CM | POA: Diagnosis not present

## 2023-06-13 DIAGNOSIS — M545 Low back pain, unspecified: Secondary | ICD-10-CM | POA: Diagnosis not present

## 2023-06-13 DIAGNOSIS — M79604 Pain in right leg: Secondary | ICD-10-CM | POA: Diagnosis not present

## 2023-06-13 NOTE — Telephone Encounter (Signed)
 Called patient to schedule visit for pharmacy referral sent by Dr. Joshua. Pt declines scheduling appt for help managing diabetes because she has an endocrinologist and she notes her A1c was elevated because she was without her pump for at least 1 month. As of today, she has the correct pump parts to restart it and also has a endo follow up in 1 month.  I spoke with patient regarding her questions and concerns following her lab work and ongoing vaginal pain. Confirmed that pt did get the estradiol  cream. Reviewed that this is meant to help with the pain she has been having. Also recommended applying KY jelly or other lubricant PRN for comfort.   Pt asked why she did not receive antibiotics as she was told her symptoms may be due to a UTI. Reviewed with patient that labs did not indicate an infection and her CT scan appeared normal.   Pt notes she continues having vaginal pain and pain in her upper back/shoulder area. Advised pt to make an appointment to be evaluated again if this pain does not improve after 1 week.  Darrelyn Drum, PharmD, BCPS, CPP Clinical Pharmacist Practitioner [facility] [phone]

## 2023-06-13 NOTE — Telephone Encounter (Signed)
Patient reported that she got her dexcom sensors, and called the 800 help line and started the sensors and linked them to her PDM and the pump in running in the automated mode.   She had no questions for me at this time.

## 2023-06-13 NOTE — Telephone Encounter (Signed)
Patient left me a message that she go her sensors and needs traiining.  LVM to call me to see if she can come in today.  Gave times available to see her today

## 2023-06-15 ENCOUNTER — Other Ambulatory Visit: Payer: Self-pay | Admitting: Internal Medicine

## 2023-06-15 ENCOUNTER — Encounter: Payer: Self-pay | Admitting: Internal Medicine

## 2023-06-15 DIAGNOSIS — B3731 Acute candidiasis of vulva and vagina: Secondary | ICD-10-CM | POA: Insufficient documentation

## 2023-06-15 LAB — CYTOLOGY - PAP
Chlamydia: NEGATIVE
Comment: NEGATIVE
Comment: NEGATIVE
Comment: NEGATIVE
Comment: NORMAL
Diagnosis: NEGATIVE
Diagnosis: REACTIVE
HSV1: NEGATIVE
HSV2: NEGATIVE
Neisseria Gonorrhea: NEGATIVE
Trichomonas: NEGATIVE

## 2023-06-15 MED ORDER — FLUCONAZOLE 150 MG PO TABS: 150.0000 mg | ORAL_TABLET | Freq: Once | ORAL | 3 refills | Status: AC

## 2023-06-16 ENCOUNTER — Other Ambulatory Visit: Payer: Self-pay | Admitting: Internal Medicine

## 2023-06-16 DIAGNOSIS — M79605 Pain in left leg: Secondary | ICD-10-CM | POA: Diagnosis not present

## 2023-06-16 DIAGNOSIS — M79604 Pain in right leg: Secondary | ICD-10-CM | POA: Diagnosis not present

## 2023-06-16 DIAGNOSIS — M545 Low back pain, unspecified: Secondary | ICD-10-CM | POA: Diagnosis not present

## 2023-06-16 DIAGNOSIS — R262 Difficulty in walking, not elsewhere classified: Secondary | ICD-10-CM | POA: Diagnosis not present

## 2023-06-20 DIAGNOSIS — M79604 Pain in right leg: Secondary | ICD-10-CM | POA: Diagnosis not present

## 2023-06-20 DIAGNOSIS — M545 Low back pain, unspecified: Secondary | ICD-10-CM | POA: Diagnosis not present

## 2023-06-20 DIAGNOSIS — E1165 Type 2 diabetes mellitus with hyperglycemia: Secondary | ICD-10-CM | POA: Diagnosis not present

## 2023-06-20 DIAGNOSIS — R262 Difficulty in walking, not elsewhere classified: Secondary | ICD-10-CM | POA: Diagnosis not present

## 2023-06-20 DIAGNOSIS — M79605 Pain in left leg: Secondary | ICD-10-CM | POA: Diagnosis not present

## 2023-06-21 ENCOUNTER — Other Ambulatory Visit: Payer: Self-pay | Admitting: Internal Medicine

## 2023-06-21 DIAGNOSIS — R3129 Other microscopic hematuria: Secondary | ICD-10-CM

## 2023-06-23 DIAGNOSIS — I10 Essential (primary) hypertension: Secondary | ICD-10-CM | POA: Diagnosis not present

## 2023-06-23 DIAGNOSIS — Z79899 Other long term (current) drug therapy: Secondary | ICD-10-CM | POA: Diagnosis not present

## 2023-06-23 DIAGNOSIS — M51362 Other intervertebral disc degeneration, lumbar region with discogenic back pain and lower extremity pain: Secondary | ICD-10-CM | POA: Diagnosis not present

## 2023-06-23 DIAGNOSIS — R768 Other specified abnormal immunological findings in serum: Secondary | ICD-10-CM | POA: Diagnosis not present

## 2023-06-23 DIAGNOSIS — E119 Type 2 diabetes mellitus without complications: Secondary | ICD-10-CM | POA: Diagnosis not present

## 2023-06-23 DIAGNOSIS — Z6841 Body Mass Index (BMI) 40.0 and over, adult: Secondary | ICD-10-CM | POA: Diagnosis not present

## 2023-06-27 DIAGNOSIS — M545 Low back pain, unspecified: Secondary | ICD-10-CM | POA: Diagnosis not present

## 2023-06-27 DIAGNOSIS — R262 Difficulty in walking, not elsewhere classified: Secondary | ICD-10-CM | POA: Diagnosis not present

## 2023-06-27 DIAGNOSIS — M79605 Pain in left leg: Secondary | ICD-10-CM | POA: Diagnosis not present

## 2023-06-27 DIAGNOSIS — M79604 Pain in right leg: Secondary | ICD-10-CM | POA: Diagnosis not present

## 2023-06-29 DIAGNOSIS — Z79899 Other long term (current) drug therapy: Secondary | ICD-10-CM | POA: Diagnosis not present

## 2023-07-03 ENCOUNTER — Ambulatory Visit (INDEPENDENT_AMBULATORY_CARE_PROVIDER_SITE_OTHER): Payer: PPO

## 2023-07-03 VITALS — Ht 64.0 in | Wt 250.0 lb

## 2023-07-03 DIAGNOSIS — Z0000 Encounter for general adult medical examination without abnormal findings: Secondary | ICD-10-CM | POA: Diagnosis not present

## 2023-07-03 NOTE — Progress Notes (Signed)
Subjective:   [name] is a 63 y.o. female who presents for Medicare Annual (Subsequent) preventive examination.  Visit Complete: Virtual I connected with  [name] on 07/03/23 by a audio enabled telemedicine application and verified that I am speaking with the correct person using two identifiers.  Patient Location: Home  Provider Location: Office/Clinic  I discussed the limitations of evaluation and management by telemedicine. The patient expressed understanding and agreed to proceed.  Vital Signs: Because this visit was a virtual/telehealth visit, some criteria may be missing or patient reported. Any vitals not documented were not able to be obtained and vitals that have been documented are patient reported.   Cardiac Risk Factors include: advanced age (>86men, >37 women);diabetes mellitus;hypertension;dyslipidemia;Other (see comment);obesity (BMI >30kg/m2), Risk factor comments: COPD, OSA     Objective:    Today's Vitals   07/03/23 0926  Weight: 250 lb (113.4 kg)  Height: 5\' 4"  (1.626 m)   Body mass index is 42.91 kg/m.     07/03/2023   10:18 AM 10/22/2022   12:30 AM 05/19/2022    7:52 AM 03/08/2022   10:51 AM 02/18/2022    9:11 AM 02/10/2022    3:51 PM 04/08/2021   10:29 AM  Advanced Directives  Does Patient Have a Medical Advance Directive? Yes No Yes Yes Yes Yes Yes  Type of Estate agent of [place];Living will  Healthcare Power of [place];Living will Healthcare Power of [place];Living will Healthcare Power of [place];Living will Healthcare Power of [employer] of [place];Living will  Does patient want to make changes to medical advance directive?    Yes (ED - Information included in AVS)  No - Patient declined Yes (ED - Information included in AVS)  Copy of Healthcare Power of Attorney in Chart? No - copy requested  No - copy requested No - copy requested No - copy requested No - copy requested     Current Medications  (verified) Outpatient Encounter Medications as of 07/03/2023  Medication Sig   albuterol (VENTOLIN HFA) 108 (90 Base) MCG/ACT inhaler Inhale 2 puffs into the lungs every 6 (six) hours as needed for wheezing or shortness of breath.   Cholecalciferol (VITAMIN D3 PO) Take 1 tablet by mouth daily.   Continuous Glucose Monitor DEVI Use as directed.   Continuous Glucose Sensor (DEXCOM G7 SENSOR) MISC 1 Device by Does not apply route as directed.   Cyanocobalamin (B-12 PO) Take 1 tablet by mouth daily.   dapagliflozin propanediol (FARXIGA) 10 MG TABS tablet Take 1 tablet (10 mg total) by mouth daily.   diclofenac Sodium (VOLTAREN) 1 % GEL Apply 2 g topically 4 (four) times daily. Rub into affected area of foot 2 to 4 times daily (Patient taking differently: Apply 2 g topically 4 (four) times daily as needed (pain).)   dicyclomine (BENTYL) 10 MG capsule TAKE 1 CAPSULE BY MOUTH THREE TIMES DAILY BEFORE MEAL(S)   esomeprazole (NEXIUM) 40 MG capsule Take 1 capsule (40 mg total) by mouth daily.   estradiol (ESTRACE VAGINAL) 0.1 MG/GM vaginal cream Place 1 Applicatorful vaginally at bedtime.   ezetimibe (ZETIA) 10 MG tablet Take 1 tablet (10 mg total) by mouth daily.   famotidine (PEPCID) 20 MG tablet Take 1 tablet (20 mg total) by mouth 2 (two) times daily as needed.   insulin aspart (NOVOLOG) 100 UNIT/ML injection MAX DAILY 120 UNITS  DX E11.65   Insulin Disposable Pump (OMNIPOD 5 DEXG7G6 PODS GEN 5) MISC 1 Device by Does not  apply route daily.   insulin glargine (LANTUS SOLOSTAR) 100 UNIT/ML Solostar Pen Inject 70 Units into the skin daily.   letrozole (FEMARA) 2.5 MG tablet Take 1 tablet by mouth once daily   lidocaine (LIDODERM) 5 % Place 1 patch onto the skin daily. Remove & Discard patch within 12 hours or as directed by MD   metFORMIN (GLUCOPHAGE) 1000 MG tablet TAKE 1 TABLET BY MOUTH TWICE DAILY WITH A MEAL   Misc Natural Products (ELDERBERRY IMMUNE COMPLEX) CHEW Chew 1 tablet by mouth daily.    montelukast (SINGULAIR) 10 MG tablet TAKE 1 TABLET BY MOUTH AT BEDTIME   Multiple Vitamin (MULTIVITAMIN WITH MINERALS) TABS tablet Take 1 tablet by mouth daily.   NARCAN 4 MG/0.1ML LIQD nasal spray kit Place 0.4 mg into the nose once.   oxyCODONE-acetaminophen (PERCOCET) 10-325 MG tablet Take 1 tablet by mouth every 8 (eight) hours as needed for pain.   pregabalin (LYRICA) 150 MG capsule Take 150 mg by mouth 2 (two) times daily.   rosuvastatin (CRESTOR) 10 MG tablet Take 1 tablet (10 mg total) by mouth daily.   SYMBICORT 160-4.5 MCG/ACT inhaler Inhale 2 puffs by mouth twice daily   tirzepatide (MOUNJARO) 2.5 MG/0.5ML Pen Inject 2.5 mg into the skin once a week.   tiZANidine (ZANAFLEX) 4 MG capsule Take 4 mg by mouth 3 (three) times daily as needed for muscle spasms.   valACYclovir (VALTREX) 500 MG tablet TAKE 1 TABLET BY MOUTH TWICE DAILY FOR 5 DAYS AT  TIME  OF  OUTBREAK   valsartan (DIOVAN) 320 MG tablet Take 1 tablet (320 mg total) by mouth daily.   Vitamin D, Ergocalciferol, (DRISDOL) 1.25 MG (50000 UNIT) CAPS capsule Take 50,000 Units by mouth once a week.   No facility-administered encounter medications on file as of 07/03/2023.    Allergies (verified) Breo ellipta [fluticasone furoate-vilanterol], Hydrochlorothiazide, and Lisinopril   History: Past Medical History:  Diagnosis Date   Allergy    Arthritis    back    Asthma    AS CHILD   Cancer (HCC)    breast CA- Right,   Chronic back pain    Chronic leg pain    due to back pain   Cocaine abuse (HCC)    in remission   COPD (chronic obstructive pulmonary disease) (HCC)    Diabetes (HCC)    with neuropathy   GERD (gastroesophageal reflux disease)    Hyperlipidemia    Hypertension    Internal hemorrhoids    Intraductal papilloma of left breast 02/18/2016   Neuromuscular disorder (HCC)    neuropathy   Personal history of radiation therapy    Postlaminectomy syndrome of lumbar region 12/07/2011   RECTAL BLEEDING  12/09/2008   Annotation: s/p EGD 7/08 mild gastritis, s/p colonoscopy 7/08- benign polyp  s/p polypectomy and isolated diverticulum.  Qualifier: Diagnosis of  By: Ditzler RN, Debra     Sciatica    per patient    Sleep apnea    CPAP    YEARS AGO DONE 1/2 YEARS AGO AND WAS TOLD DID NOT HAVE   Tobacco abuse    Tubular adenoma of colon    Uterine fibroid    s/p hysterectomy   Past Surgical History:  Procedure Laterality Date   BACK SURGERY  2012   L5-S1 microendoscopic disectomy last surgery 06/2011   BREAST BIOPSY     LEFT    01/19/16   BREAST BIOPSY Right 02/16/2021   BREAST EXCISIONAL BIOPSY Left 02/2016  BREAST LUMPECTOMY Right 03/12/2021   BREAST LUMPECTOMY WITH RADIOACTIVE SEED AND SENTINEL LYMPH NODE BIOPSY Right 03/12/2021   Procedure: RIGHT BREAST LUMPECTOMY WITH RADIOACTIVE SEED AND SENTINEL LYMPH NODE BIOPSY;  Surgeon: Griselda Miner, MD;  Location: [facility] OR;  Service: General;  Laterality: Right;   BREAST REDUCTION SURGERY  1982   CATARACT EXTRACTION Right 11/07/2019   COLONOSCOPY     COLONOSCOPY WITH PROPOFOL N/A 05/19/2022   Procedure: COLONOSCOPY WITH PROPOFOL;  Surgeon: Meryl Dare, MD;  Location: [facility];  Service: Gastroenterology;  Laterality: N/A;   HAND SURGERY     MIDDLE TRIGGER FINGER RIGHT SIDE   POLYPECTOMY  05/19/2022   Procedure: POLYPECTOMY;  Surgeon: Meryl Dare, MD;  Location: [facility];  Service: Gastroenterology;;   RADIOACTIVE SEED GUIDED EXCISIONAL BREAST BIOPSY Left 02/18/2016   Procedure: LEFT RADIOACTIVE SEED GUIDED EXCISIONAL BREAST BIOPSY;  Surgeon: Ovidio Kin, MD;  Location: [facility] OR;  Service: General;  Laterality: Left;   REDUCTION MAMMAPLASTY Bilateral    TONSILLECTOMY  2008   TOTAL ABDOMINAL HYSTERECTOMY  05/24/2007   hysterectomy   TUBAL LIGATION     UPPER GASTROINTESTINAL ENDOSCOPY     Family History  Problem Relation Age of Onset   Diabetes Father    Heart disease Father    Hypertension Father    Diabetes Mother     Cancer Mother        brain   Hypertension Mother    Kidney disease Sister    Diabetes Sister    Kidney cancer Sister    Stroke Sister    Diabetes Sister    Hypertension Sister    Diabetes Sister    Hypertension Sister    Obesity Son    Colon cancer Neg Hx    Colon polyps Neg Hx    Esophageal cancer Neg Hx    Rectal cancer Neg Hx    Stomach cancer Neg Hx    Social History   Socioeconomic History   Marital status: Married    Spouse name: Ethelene Browns   Number of children: 3   Years of education: Not on file   Highest education level: Some college, no degree  Occupational History    Comment: [employer]   Tobacco Use   Smoking status: Former    Current packs/day: 0.00    Average packs/day: 0.5 packs/day for 20.0 years (10.0 ttl pk-yrs)    Types: Cigarettes    Start date: 07/23/1976    Quit date: 07/23/1996    Years since quitting: 26.9   Smokeless tobacco: Never  Vaping Use   Vaping status: Never Used  Substance and Sexual Activity   Alcohol use: Not Currently    Comment: recovering addict clean for 9 years   Drug use: Not Currently    Types: Cocaine, Marijuana    Comment: recovering addict clean for 16 years   Sexual activity: Yes    Birth control/protection: Surgical  Other Topics Concern   Not on file  Social History Narrative   Current Social History 01/17/2020        Patient lives with spouse in a home which is 1 story. There are not steps up to the entrance the patient uses.       Patient's method of transportation is personal car.      The highest level of education was some college.      The patient currently works part-time.      Identified important Relationships are God, husband, kids, grandkids  Pets : None       Interests / Fun: Drawing and nature       Lives with husband-2025      Current Stressors: Pain, Covid       Religious / Personal Beliefs: Christian       Other: None    Social Drivers of Corporate investment banker  Strain: Low Risk  (07/03/2023)   Overall Financial Resource Strain (CARDIA)    Difficulty of Paying Living Expenses: Not hard at all  Food Insecurity: No Food Insecurity (07/03/2023)   Hunger Vital Sign    Worried About Running Out of Food in the Last Year: Never true    Ran Out of Food in the Last Year: Never true  Transportation Needs: No Transportation Needs (07/03/2023)   PRAPARE - Administrator, Civil Service (Medical): No    Lack of Transportation (Non-Medical): No  Physical Activity: Insufficiently Active (07/03/2023)   Exercise Vital Sign    Days of Exercise per Week: 2 days    Minutes of Exercise per Session: 50 min  Stress: No Stress Concern Present (07/03/2023)   [employer] of Occupational Health - Occupational Stress Questionnaire    Feeling of Stress : Only a little  Social Connections: Socially Integrated (07/03/2023)   Social Connection and Isolation Panel [NHANES]    Frequency of Communication with Friends and Family: More than three times a week    Frequency of Social Gatherings with Friends and Family: Never    Attends Religious Services: More than 4 times per year    Active Member of [employer] or Organizations: Yes    Attends Banker Meetings: Never    Marital Status: Married    Tobacco Counseling Counseling given: Not Answered   Clinical Intake:  Pre-visit preparation completed: Yes  Pain : No/denies pain     BMI - recorded: 42.91 Nutritional Status: BMI > 30  Obese Nutritional Risks: None Diabetes: Yes CBG done?: Yes (173) Did pt. bring in CBG monitor from home?: No  How often do you need to have someone help you when you read instructions, pamphlets, or other written materials from your doctor or pharmacy?: 1 - Never  Interpreter Needed?: No  Information entered by :: Viviene Thurston, RMA   Activities of Daily Living    07/03/2023   10:12 AM  In your present state of health, do you have any difficulty performing the  following activities:  Hearing? 0  Vision? 0  Difficulty concentrating or making decisions? 0  Walking or climbing stairs? 0  Dressing or bathing? 0  Doing errands, shopping? 0  Preparing Food and eating ? N  Using the Toilet? N  In the past six months, have you accidently leaked urine? Y  Do you have problems with loss of bowel control? N  Managing your Medications? N  Managing your Finances? N  Housekeeping or managing your Housekeeping? N    Patient Care Team: Georgina Quint, MD as PCP - General (Internal Medicine) Shamleffer, Konrad Dolores, MD as Consulting Physician (Endocrinology) [facility] Waymon Budge, MD as Consulting Physician (Pulmonary Disease) Vivi Barrack, DPM as Consulting Physician (Podiatry) Meryl Dare, MD (Inactive) as Consulting Physician (Gastroenterology) Griselda Miner, MD as Consulting Physician (General Surgery) Serena Croissant, MD as Consulting Physician (Hematology and Oncology) Dorothy Puffer, MD as Consulting Physician (Radiation Oncology) [facility]  Indicate any recent Medical Services you may have received from other than [facility]  providers in the past year (date may be approximate).     Assessment:   This is a routine wellness examination for [place].  Hearing/Vision screen Hearing Screening - Comments:: Denies hearing difficulties   Vision Screening - Comments:: Denies vision issues.    Goals Addressed               This Visit's Progress     Patient Stated (pt-stated)        Patient would like to lose weight      Depression Screen    07/03/2023   10:25 AM 05/16/2023    1:52 PM 04/06/2023   10:03 AM 02/14/2023    9:53 AM 10/27/2022    9:29 AM 09/28/2022    1:51 PM 06/01/2022    8:34 AM  PHQ 2/9 Scores  PHQ - 2 Score 0 0 0 0 0 0 0  PHQ- 9 Score 1   1 3       Fall Risk    07/03/2023   10:19 AM 05/16/2023    1:52 PM 04/06/2023   10:03 AM 02/14/2023    9:53 AM 10/27/2022     9:28 AM  Fall Risk   Falls in the past year? 1 0 0 1 0  Number falls in past yr: 1 0 0 1 0  Injury with Fall? 0 0 0 1 0  Risk for fall due to :  No Fall Risks No Fall Risks  No Fall Risks  Follow up Falls evaluation completed;Falls prevention discussed Falls evaluation completed Falls evaluation completed Falls evaluation completed;Education provided Falls evaluation completed    MEDICARE RISK AT HOME: Medicare Risk at Home Any stairs in or around the home?: No Home free of loose throw rugs in walkways, pet beds, electrical cords, etc?: Yes Adequate lighting in your home to reduce risk of falls?: Yes Life alert?: No Use of a cane, walker or w/c?: No Grab bars in the bathroom?: No Shower chair or bench in shower?: No Elevated toilet seat or a handicapped toilet?: No  TIMED UP AND GO:  Was the test performed?  No    Cognitive Function:        07/03/2023   10:11 AM 02/18/2022    9:12 AM 02/13/2021   11:19 AM  6CIT Screen  What Year? 0 points 0 points 0 points  What month? 0 points 0 points 0 points  What time? 0 points 0 points 0 points  Count back from 20 0 points 0 points 0 points  Months in reverse 0 points 0 points 0 points  Repeat phrase 0 points 0 points 0 points  Total Score 0 points 0 points 0 points    Immunizations Immunization History  Administered Date(s) Administered   Influenza Inj Mdck Quad With Preservative 03/09/2022   Influenza Split 03/11/2011, 03/02/2012   Influenza Whole 02/12/2010   Influenza,inj,Quad PF,6+ Mos 03/08/2013, 02/27/2014, 05/19/2015, 04/19/2016, 03/09/2017, 02/06/2018, 02/12/2019, 04/10/2020   Influenza-Unspecified 03/18/2021, 03/27/2022, 01/25/2023   PFIZER(Purple Top)SARS-COV-2 Vaccination 07/25/2019, 08/19/2019, 03/21/2020, 01/03/2021   PPD Test 10/04/2010, 07/11/2011, 12/05/2012   Pneumococcal Polysaccharide-23 01/13/2012, 07/18/2017   Tdap 11/18/2010, 02/02/2021   Zoster Recombinant(Shingrix) 08/31/2021, 11/14/2021    TDAP  status: Up to date  Flu Vaccine status: Up to date  Pneumococcal vaccine status: Up to date  Covid-19 vaccine status: Information provided on how to obtain vaccines.   Qualifies for Shingles Vaccine? Yes   Zostavax completed Yes   Shingrix Completed?: Yes  Screening Tests Health Maintenance  Topic Date Due  Pneumococcal Vaccine 57-21 Years old (3 of 3 - PCV) 07/18/2018   LIPID PANEL  06/02/2023   HEMOGLOBIN A1C  09/07/2023   FOOT EXAM  09/16/2023   OPHTHALMOLOGY EXAM  12/09/2023   MAMMOGRAM  05/04/2024   Diabetic kidney evaluation - eGFR measurement  06/08/2024   Diabetic kidney evaluation - Urine ACR  06/08/2024   Medicare Annual Wellness (AWV)  07/02/2024   Colonoscopy  05/20/2027   DTaP/Tdap/Td (3 - Td or Tdap) 02/03/2031   INFLUENZA VACCINE  Completed   Hepatitis C Screening  Completed   HIV Screening  Completed   Zoster Vaccines- Shingrix  Completed   HPV VACCINES  Aged Out   COVID-19 Vaccine  Discontinued    Health Maintenance  Health Maintenance Due  Topic Date Due   Pneumococcal Vaccine 14-71 Years old (3 of 3 - PCV) 07/18/2018   LIPID PANEL  06/02/2023    Colorectal cancer screening: Type of screening: Colonoscopy. Completed 05/19/2022. Repeat every 3 years  Mammogram status: Completed 05/05/2023. Repeat every year   Lung Cancer Screening: (Low Dose CT Chest recommended if Age 21-80 years, 20 pack-year currently smoking OR have quit w/in 15years.) does not qualify.   Lung Cancer Screening Referral: NA  Additional Screening:  Hepatitis C Screening: does qualify; Completed 04/03/2009  Vision Screening: Recommended annual ophthalmology exams for early detection of glaucoma and other disorders of the eye. Is the patient up to date with their annual eye exam?  Yes  Who is the provider or what is the name of the office in which the patient attends annual eye exams? Dr. Dione Booze If pt is not established with a provider, would they like to be referred to a  provider to establish care? No .   Dental Screening: Recommended annual dental exams for proper oral hygiene  Diabetic Foot Exam: Diabetic Foot Exam: Completed 09/16/2022  Community Resource Referral / Chronic Care Management: CRR required this visit?  No   CCM required this visit?  No     Plan:     I have personally reviewed and noted the following in the patient's chart:   Medical and social history Use of alcohol, tobacco or illicit drugs  Current medications and supplements including opioid prescriptions. Patient is not currently taking opioid prescriptions. Functional ability and status Nutritional status Physical activity Advanced directives List of other physicians Hospitalizations, surgeries, and ER visits in previous 12 months Vitals Screenings to include cognitive, depression, and falls Referrals and appointments  In addition, I have reviewed and discussed with patient certain preventive protocols, quality metrics, and best practice recommendations. A written personalized care plan for preventive services as well as general preventive health recommendations were provided to patient.     Esparanza Krider L Ondrea Dow, CMA   07/03/2023   After Visit Summary: (MyChart) Due to this being a telephonic visit, the after visit summary with patients personalized plan was offered to patient via MyChart   Nurse Notes: Patient is due for a lipid panel, which order was placed by Dr. Yetta Barre on 06/10/23.  Patient's chart is showing that she is due for a Pneumonia vaccine, however pt has had the Pneumococcal 23, twice.   She had no other concerns to address today.

## 2023-07-03 NOTE — Patient Instructions (Signed)
Ms. Mater , Thank you for taking time to come for your Medicare Wellness Visit. I appreciate your ongoing commitment to your health goals. Please review the following plan we discussed and let me know if I can assist you in the future.   Referrals/Orders/Follow-Ups/Clinician Recommendations: It was nice talking to you today.  You are due for a lipid panel (lab work) during your next visit.  Aim for 30 minutes of exercise or brisk walking, 6-8 glasses of water, and 5 servings of fruits and vegetables each day.   This is a list of the screening recommended for you and due dates:  Health Maintenance  Topic Date Due   Pneumococcal Vaccination (3 of 3 - PCV) 07/18/2018   Lipid (cholesterol) test  06/02/2023   Hemoglobin A1C  09/07/2023   Complete foot exam   09/16/2023   Eye exam for diabetics  12/09/2023   Mammogram  05/04/2024   Yearly kidney function blood test for diabetes  06/08/2024   Yearly kidney health urinalysis for diabetes  06/08/2024   Medicare Annual Wellness Visit  07/02/2024   Colon Cancer Screening  05/20/2027   DTaP/Tdap/Td vaccine (3 - Td or Tdap) 02/03/2031   Flu Shot  Completed   Hepatitis C Screening  Completed   HIV Screening  Completed   Zoster (Shingles) Vaccine  Completed   HPV Vaccine  Aged Out   COVID-19 Vaccine  Discontinued    Advanced directives: (Declined) Advance directive discussed with you today. Even though you declined this today, please call our office should you change your mind, and we can give you the proper paperwork for you to fill out.  Next Medicare Annual Wellness Visit scheduled for next year: Yes

## 2023-07-04 ENCOUNTER — Telehealth: Payer: Self-pay

## 2023-07-04 DIAGNOSIS — M79605 Pain in left leg: Secondary | ICD-10-CM | POA: Diagnosis not present

## 2023-07-04 DIAGNOSIS — M545 Low back pain, unspecified: Secondary | ICD-10-CM | POA: Diagnosis not present

## 2023-07-04 DIAGNOSIS — R262 Difficulty in walking, not elsewhere classified: Secondary | ICD-10-CM | POA: Diagnosis not present

## 2023-07-04 DIAGNOSIS — M79604 Pain in right leg: Secondary | ICD-10-CM | POA: Diagnosis not present

## 2023-07-04 NOTE — Progress Notes (Signed)
Care Guide Pharmacy Note  07/04/2023 Name: [name] MRN: [id number] DOB: [birth year]  Referred By: Georgina Quint, MD Reason for referral: Care Coordination (TNM Diabetes. )   [name] is a 63 y.o. year old female who is a primary care patient of Georgina Quint, MD.  [name] was referred to the pharmacist for assistance related to: DMII  Successful contact was made with the patient to discuss pharmacy services including being ready for the pharmacist to call at least 5 minutes before the scheduled appointment time and to have medication bottles and any blood pressure readings ready for review. The patient agreed to meet with the pharmacist via telephone visit on (date/time). 07/14/23 at 9:00 a.m.   [name]  [facility], [facility] Health Care Management Assistant Direct Dial: [phone]  Fax: [phone]

## 2023-07-06 DIAGNOSIS — G4733 Obstructive sleep apnea (adult) (pediatric): Secondary | ICD-10-CM | POA: Diagnosis not present

## 2023-07-07 DIAGNOSIS — M79605 Pain in left leg: Secondary | ICD-10-CM | POA: Diagnosis not present

## 2023-07-07 DIAGNOSIS — M545 Low back pain, unspecified: Secondary | ICD-10-CM | POA: Diagnosis not present

## 2023-07-07 DIAGNOSIS — R262 Difficulty in walking, not elsewhere classified: Secondary | ICD-10-CM | POA: Diagnosis not present

## 2023-07-07 DIAGNOSIS — M79604 Pain in right leg: Secondary | ICD-10-CM | POA: Diagnosis not present

## 2023-07-07 DIAGNOSIS — G4733 Obstructive sleep apnea (adult) (pediatric): Secondary | ICD-10-CM | POA: Diagnosis not present

## 2023-07-13 ENCOUNTER — Ambulatory Visit: Payer: PPO | Admitting: Internal Medicine

## 2023-07-13 ENCOUNTER — Encounter: Payer: Self-pay | Admitting: Internal Medicine

## 2023-07-13 VITALS — BP 132/84 | HR 84 | Ht 64.0 in | Wt 258.0 lb

## 2023-07-13 DIAGNOSIS — Z794 Long term (current) use of insulin: Secondary | ICD-10-CM | POA: Diagnosis not present

## 2023-07-13 DIAGNOSIS — E1165 Type 2 diabetes mellitus with hyperglycemia: Secondary | ICD-10-CM | POA: Diagnosis not present

## 2023-07-13 DIAGNOSIS — E785 Hyperlipidemia, unspecified: Secondary | ICD-10-CM

## 2023-07-13 DIAGNOSIS — E1140 Type 2 diabetes mellitus with diabetic neuropathy, unspecified: Secondary | ICD-10-CM | POA: Diagnosis not present

## 2023-07-13 DIAGNOSIS — R600 Localized edema: Secondary | ICD-10-CM

## 2023-07-13 MED ORDER — TIRZEPATIDE 5 MG/0.5ML ~~LOC~~ SOAJ: 5.0000 mg | SUBCUTANEOUS | 3 refills | Status: DC

## 2023-07-13 NOTE — Progress Notes (Signed)
Name: [name]  Age/ Sex: 63 y.o., female   MRN/ DOB: [id number], [birth year]     PCP: Crystal Quint, MD   Reason for Endocrinology Evaluation: Type 2 Diabetes Mellitus  Initial Endocrine Consultative Visit: 03/18/2019    PATIENT IDENTIFIER: [name] is a 64 y.o. female with a past medical history of T2DM, HTN, OSA and dyslipidemia , breast CA(Dx 02/2021) status post right lumpectomy chemo and radiation. The patient has followed with Endocrinology clinic since 03/18/2019 for consultative assistance with management of her diabetes.  DIABETIC HISTORY:  [name] was diagnosed with T2DM many years ago. Has been on Soliqua, Glipizide and V-Go was started in 2019.  Her hemoglobin A1c has ranged from 8.1% in 2016, peaking at 9.8% in 2020.  On her initial visit to our clinic, she was on V-Go 40 with Humulin U-500 , Metformin, and Ozempic with an A1c 9.7% . We stopped the V-Go  And the U-500 due to recurrent hypoglycemia . We started Novolog Mix and continued metformin and Ozempic.   SGLT-2 inhibitor started through PCP 03/2020  She stopped Ozempic 12/222 based on GI recommendations due to abdominal pain  Mounjaro started 02/2023  SUBJECTIVE:   During the last visit (03/10/2023): A1c 6.7%   Today (07/13/2023): [name] is here for a follow up on her diabetes care.  She checks her blood sugars multiple times daily, through freestyle libre. The patient has not  had hypoglycemic episodes since the last clinic visit.   She continues to follow-up with oncology for Breast Ca that was diagnosed 02/2021, S/P lumpectomy , and radiation  Denies diarrhea or constipation  Denies nausea or vomiting     This patient with type 2 diabetes is treated with Omnipod  (insulin pump). During the visit the pump basal and bolus doses were reviewed including carb/insulin rations and supplemental doses. The clinical list was updated. The glucose meter download was reviewed in detail to determine if  the current pump settings are providing the best glycemic control without excessive hypoglycemia.  Pump and meter download:    Pump   Omnipod Settings   Insulin type   Novolog    Basal rate       0000 2.70 u/h    0800 3.10  I:C ratio       0000 1:1    Enter# 12 gwith breakfast, 14 g with lunch and Supper               Sensitivity       0000  20      Goal       0000  120           Type & Model of Pump: Omnipod Insulin Type: Currently using Novolog .  Body mass index is 44.29 kg/m.  PUMP STATISTICS: Average BG: 221 Average Daily Carbs (g): 33.6 Average Daily Basal:53.2 (61%) Average Daily Bolus: 33.6 (39 %)    HOME DIABETES REGIMEN:  Metformin 1000 tablet Twice daily  Farxiga 10 mg daily  Mounjaro 2.5 mg weekly Novolog     CONTINUOUS GLUCOSE MONITORING RECORD INTERPRETATION    Dates of Recording: 1/17-1/30/2025 Sensor description: Dexcom  Results statistics:   CGM use % of time 69  Average and SD 221/52  Time in range 24 %  % Time Above 180 49  % Time above 250 27  % Time Below target 0    Glycemic patterns summary:   Hypoglycemic episodes occurred N/A  Hyperglycemia :  Postprandial   Overnight periods: Variable   DIABETIC COMPLICATIONS: Microvascular complications:  Neuropathy  Denies: CKD, retinopathy  Last eye exam: Completed 12/07/2021   Macrovascular complications:    Denies: CAD, PVD, CVA    HISTORY:  Past Medical History:  Past Medical History:  Diagnosis Date   Allergy    Arthritis    back    Asthma    AS CHILD   Cancer (HCC)    breast CA- Right,   Chronic back pain    Chronic leg pain    due to back pain   Cocaine abuse (HCC)    in remission   COPD (chronic obstructive pulmonary disease) (HCC)    Diabetes (HCC)    with neuropathy   GERD (gastroesophageal reflux disease)    Hyperlipidemia    Hypertension    Internal hemorrhoids    Intraductal papilloma of left breast 02/18/2016   Neuromuscular disorder  (HCC)    neuropathy   Personal history of radiation therapy    Postlaminectomy syndrome of lumbar region 12/07/2011   RECTAL BLEEDING 12/09/2008   Annotation: s/p EGD 7/08 mild gastritis, s/p colonoscopy 7/08- benign polyp  s/p polypectomy and isolated diverticulum.  Qualifier: Diagnosis of  By: Ditzler RN, Crystal     Sciatica    per patient    Sleep apnea    CPAP    YEARS AGO DONE 1/2 YEARS AGO AND WAS TOLD DID NOT HAVE   Tobacco abuse    Tubular adenoma of colon    Uterine fibroid    s/p hysterectomy   Past Surgical History:  Past Surgical History:  Procedure Laterality Date   BACK SURGERY  2012   L5-S1 microendoscopic disectomy last surgery 06/2011   BREAST BIOPSY     LEFT    01/19/16   BREAST BIOPSY Right 02/16/2021   BREAST EXCISIONAL BIOPSY Left 02/2016   BREAST LUMPECTOMY Right 03/12/2021   BREAST LUMPECTOMY WITH RADIOACTIVE SEED AND SENTINEL LYMPH NODE BIOPSY Right 03/12/2021   Procedure: RIGHT BREAST LUMPECTOMY WITH RADIOACTIVE SEED AND SENTINEL LYMPH NODE BIOPSY;  Surgeon: Crystal Miner, MD;  Location: [facility] OR;  Service: General;  Laterality: Right;   BREAST REDUCTION SURGERY  1982   CATARACT EXTRACTION Right 11/07/2019   COLONOSCOPY     COLONOSCOPY WITH PROPOFOL N/A 05/19/2022   Procedure: COLONOSCOPY WITH PROPOFOL;  Surgeon: Crystal Dare, MD;  Location: [facility];  Service: Gastroenterology;  Laterality: N/A;   HAND SURGERY     MIDDLE TRIGGER FINGER RIGHT SIDE   POLYPECTOMY  05/19/2022   Procedure: POLYPECTOMY;  Surgeon: Crystal Dare, MD;  Location: [facility];  Service: Gastroenterology;;   RADIOACTIVE SEED GUIDED EXCISIONAL BREAST BIOPSY Left 02/18/2016   Procedure: LEFT RADIOACTIVE SEED GUIDED EXCISIONAL BREAST BIOPSY;  Surgeon: Crystal Kin, MD;  Location: [facility] OR;  Service: General;  Laterality: Left;   REDUCTION MAMMAPLASTY Bilateral    TONSILLECTOMY  2008   TOTAL ABDOMINAL HYSTERECTOMY  05/24/2007   hysterectomy   TUBAL LIGATION     UPPER  GASTROINTESTINAL ENDOSCOPY     Social History:  reports that she quit smoking about 26 years ago. Her smoking use included cigarettes. She started smoking about 47 years ago. She has a 10 pack-year smoking history. She has never used smokeless tobacco. She reports that she does not currently use alcohol. She reports that she does not currently use drugs after having used the following drugs: Cocaine and Marijuana. Family History:  Family History  Problem Relation Age of Onset  Diabetes Father    Heart disease Father    Hypertension Father    Diabetes Mother    Cancer Mother        brain   Hypertension Mother    Kidney disease Sister    Diabetes Sister    Kidney cancer Sister    Stroke Sister    Diabetes Sister    Hypertension Sister    Diabetes Sister    Hypertension Sister    Obesity Son    Colon cancer Neg Hx    Colon polyps Neg Hx    Esophageal cancer Neg Hx    Rectal cancer Neg Hx    Stomach cancer Neg Hx      HOME MEDICATIONS: Allergies as of 07/13/2023       Reactions   Breo Ellipta [fluticasone Furoate-vilanterol] Other (See Comments)   Urinary retention   Hydrochlorothiazide    Hypokalemia    Lisinopril Cough        Medication List        Accurate as of July 13, 2023  9:06 AM. If you have any questions, ask your nurse or doctor.          albuterol 108 (90 Base) MCG/ACT inhaler Commonly known as: VENTOLIN HFA Inhale 2 puffs into the lungs every 6 (six) hours as needed for wheezing or shortness of breath.   B-12 PO Take 1 tablet by mouth daily.   Continuous Glucose Monitor Devi Use as directed.   dapagliflozin propanediol 10 MG Tabs tablet Commonly known as: Farxiga Take 1 tablet (10 mg total) by mouth daily.   Dexcom G7 Sensor Misc 1 Device by Does not apply route as directed.   diclofenac Sodium 1 % Gel Commonly known as: VOLTAREN Apply 2 g topically 4 (four) times daily. Rub into affected area of foot 2 to 4 times daily What  changed:  when to take this reasons to take this additional instructions   dicyclomine 10 MG capsule Commonly known as: BENTYL TAKE 1 CAPSULE BY MOUTH THREE TIMES DAILY BEFORE MEAL(S)   Elderberry Immune Complex Chew Chew 1 tablet by mouth daily.   esomeprazole 40 MG capsule Commonly known as: NEXIUM Take 1 capsule (40 mg total) by mouth daily.   estradiol 0.1 MG/GM vaginal cream Commonly known as: ESTRACE VAGINAL Place 1 Applicatorful vaginally at bedtime.   ezetimibe 10 MG tablet Commonly known as: Zetia Take 1 tablet (10 mg total) by mouth daily.   famotidine 20 MG tablet Commonly known as: PEPCID Take 1 tablet (20 mg total) by mouth 2 (two) times daily as needed.   insulin aspart 100 UNIT/ML injection Commonly known as: NovoLOG MAX DAILY 120 UNITS  DX E11.65   Lantus SoloStar 100 UNIT/ML Solostar Pen Generic drug: insulin glargine Inject 70 Units into the skin daily.   letrozole 2.5 MG tablet Commonly known as: FEMARA Take 1 tablet by mouth once daily   lidocaine 5 % Commonly known as: Lidoderm Place 1 patch onto the skin daily. Remove & Discard patch within 12 hours or as directed by MD   metFORMIN 1000 MG tablet Commonly known as: GLUCOPHAGE TAKE 1 TABLET BY MOUTH TWICE DAILY WITH A MEAL   montelukast 10 MG tablet Commonly known as: SINGULAIR TAKE 1 TABLET BY MOUTH AT BEDTIME   multivitamin with minerals Tabs tablet Take 1 tablet by mouth daily.   Narcan 4 MG/0.1ML Liqd nasal spray kit Generic drug: naloxone Place 0.4 mg into the nose once.   Omnipod 5 DexG7G6  Pods Gen 5 Misc 1 Device by Does not apply route daily.   oxyCODONE-acetaminophen 10-325 MG tablet Commonly known as: PERCOCET Take 1 tablet by mouth every 8 (eight) hours as needed for pain.   pregabalin 150 MG capsule Commonly known as: LYRICA Take 150 mg by mouth 2 (two) times daily.   rosuvastatin 10 MG tablet Commonly known as: Crestor Take 1 tablet (10 mg total) by mouth  daily.   Symbicort 160-4.5 MCG/ACT inhaler Generic drug: budesonide-formoterol Inhale 2 puffs by mouth twice daily   tirzepatide 2.5 MG/0.5ML Pen Commonly known as: MOUNJARO Inject 2.5 mg into the skin once a week.   tiZANidine 4 MG capsule Commonly known as: ZANAFLEX Take 4 mg by mouth 3 (three) times daily as needed for muscle spasms.   valACYclovir 500 MG tablet Commonly known as: VALTREX TAKE 1 TABLET BY MOUTH TWICE DAILY FOR 5 DAYS AT  TIME  OF  OUTBREAK   valsartan 320 MG tablet Commonly known as: DIOVAN Take 1 tablet (320 mg total) by mouth daily.   Vitamin D (Ergocalciferol) 1.25 MG (50000 UNIT) Caps capsule Commonly known as: DRISDOL Take 50,000 Units by mouth once a week.   VITAMIN D3 PO Take 1 tablet by mouth daily.         OBJECTIVE:   Vital Signs: BP 132/84 (BP Location: Left Arm, Patient Position: Sitting, Cuff Size: Large)   Pulse 84   Ht 5\' 4"  (1.626 m)   Wt 258 lb (117 kg)   SpO2 97%   BMI 44.29 kg/m   Wt Readings from Last 3 Encounters:  07/13/23 258 lb (117 kg)  07/03/23 250 lb (113.4 kg)  06/09/23 250 lb 12.8 oz (113.8 kg)     Exam: General:  NAD  Lungs: Clear with good BS bilat   Heart: RRR   Extremities: Trace  pretibial edema.     DM foot exam: 07/13/2023  The skin of the feet is intact without sores or ulcerations. The pedal pulses are 2+ on right and 2+ on left. The sensation is intact to a screening 5.07, 10 gram monofilament bilaterally    DATA REVIEWED:  Lab Results  Component Value Date   HGBA1C 10.2 (H) 06/09/2023   HGBA1C 9.0 (A) 03/10/2023   HGBA1C 6.7 (A) 09/06/2022    Latest Reference Range & Units 06/09/23 08:55  Sodium 135 - 145 mEq/L 139  Potassium 3.5 - 5.1 mEq/L 3.8  Chloride 96 - 112 mEq/L 101  CO2 19 - 32 mEq/L 28  Glucose 70 - 99 mg/dL 161 (H)  BUN 6 - 23 mg/dL 10  Creatinine 0.96 - 0.45 mg/dL 4.09  Calcium 8.4 - 81.1 mg/dL 9.8  Alkaline Phosphatase 39 - 117 U/L 105  Albumin 3.5 - 5.2 g/dL 4.2   Lipase 91.4 - 78.2 U/L 189.0 (H)  AST 0 - 37 U/L 22  ALT 0 - 35 U/L 25  Total Protein 6.0 - 8.3 g/dL 7.4  Bilirubin, Direct 0.0 - 0.3 mg/dL 0.1  Total Bilirubin 0.2 - 1.2 mg/dL 0.5  GFR >95.62 mL/min 94.80  (H): Data is abnormally high   ASSESSMENT / PLAN / RECOMMENDATIONS:   1) Type 2 Diabetes Mellitus, Poorly   Controlled , With Neuropathic complications - Most recent A1c of 10.1%. Goal A1c < 7.0 %.     -Her A1c has increased from 6.7% to 10.2%, the patient with intermittent use of pump/CGM technology -We discussed the importance of compliance, and if for what ever reason she is unable  to get her pump supplies she will need to use multiple daily injections of insulin -She discontinued Ozempic due to abdominal pain, tolerating Mounjaro will increase - She will continue to enter  #12 grams  with a full breakfast, #14 for lunch and supper  -She was also advised to enter 6 g with a snack -I will increase her basal rate as below   MEDICATIONS: -Increase Mounjaro 5 mg weekly -Continue Metformin 1000 mg,  1 tablet Twice daily  - Continue Farxiga 10 mg daily  - Novolog per pump     Pump   Omnipod Settings   Insulin type   Novolog    Basal rate       0000 2.75 u/h    0800 3.15  I:C ratio       0000 1:1    Enter# 12 gwith breakfast, 14 g with lunch and Supper               Sensitivity       0000  15      Goal       0000  120           EDUCATION / INSTRUCTIONS: BG monitoring instructions: Patient is instructed to check her blood sugars 2 times a day, fasting and bedtime . Call [facility] if: BG persistently < 70  I reviewed the Rule of 15 for the treatment of hypoglycemia in detail with the patient. Literature supplied.   2. Dyslipidemia :   - She was on Atrovastatin at somepoint but developed leg pains and we switched to Rosuvastatin and Zetia -Lipid panel pending   Medication  Rosuvastatin 10 mg daily  Zetia 10 mg daily   3. LE Edema  :  -Patient was drinking Gatorade that has 22 g of glucose, patient advised to avoid Gatorade as it contains sugar and salt -Patient have advised to reduce salt intake  F/U in  3 months    Signed electronically by: Lyndle Herrlich, MD  [facility]  [facility] [address] [place] [place], [place] [address] Phone: [phone] FAX: [phone]   CC: Crystal Quint, MD [address] [place] [address] Phone: [phone]  Fax: [phone]  Return to Endocrinology clinic as below: Future Appointments  Date Time Provider Department Center  07/14/2023  9:00 AM [facility] CCM PHARMACIST [facility] None  11/14/2023  8:40 AM Crystal Quint, MD [facility] None  11/28/2023 10:00 AM Jetty Duhamel D, MD [facility] None  07/03/2024  9:30 AM [facility]-ANNUAL WELLNESS VISIT 2 [facility] None

## 2023-07-13 NOTE — Patient Instructions (Addendum)
-   Enter 12 grams with Breakfast, 14 grams with Lunch and Supper  - Enter # 6 grams with a snack  -Increase Mounjaro 5 mg weekly -Continue Metformin 1 tablet Twice daily  - Continue  Farxiga 10 mg , 1 tablet with breakfast   HOW TO TREAT LOW BLOOD SUGARS (Blood sugar LESS THAN 70 MG/DL) Please follow the RULE OF 15 for the treatment of hypoglycemia treatment (when your (blood sugars are less than 70 mg/dL)   STEP 1: Take 15 grams of carbohydrates when your blood sugar is low, which includes:  3-4 GLUCOSE TABS  OR 3-4 OZ OF JUICE OR REGULAR SODA OR ONE TUBE OF GLUCOSE GEL    STEP 2: RECHECK blood sugar in 15 MINUTES STEP 3: If your blood sugar is still low at the 15 minute recheck --> then, go back to STEP 1 and treat AGAIN with another 15 grams of carbohydrates.

## 2023-07-14 ENCOUNTER — Other Ambulatory Visit: Payer: PPO | Admitting: Pharmacist

## 2023-07-14 ENCOUNTER — Telehealth: Payer: Self-pay | Admitting: Pharmacist

## 2023-07-14 DIAGNOSIS — M545 Low back pain, unspecified: Secondary | ICD-10-CM | POA: Diagnosis not present

## 2023-07-14 DIAGNOSIS — M79605 Pain in left leg: Secondary | ICD-10-CM | POA: Diagnosis not present

## 2023-07-14 DIAGNOSIS — E1169 Type 2 diabetes mellitus with other specified complication: Secondary | ICD-10-CM

## 2023-07-14 DIAGNOSIS — M79604 Pain in right leg: Secondary | ICD-10-CM | POA: Diagnosis not present

## 2023-07-14 DIAGNOSIS — R262 Difficulty in walking, not elsewhere classified: Secondary | ICD-10-CM | POA: Diagnosis not present

## 2023-07-14 NOTE — Progress Notes (Signed)
07/14/2023 Name: [name] MRN: [id number] DOB: 14-Apr-1961  Chief Complaint  Patient presents with   [place] Metric Diabetes    [name] is a 63 y.o. year old female who presented for a telephone visit.   They were referred to the pharmacist by a quality report for assistance in managing diabetes.   Subjective:  Care Team: Primary Care Provider: Georgina Quint, MD ; Next Scheduled Visit: 11/14/23 Endocrinologist Shamleffer; Next Scheduled Visit: 10/17/2023  Medication Access/Adherence  Current Pharmacy:  [id number] - [place] ([place]), [place] - [address]  ([place]) [place] [address] Phone: [phone] Fax: [phone]   Patient reports affordability concerns with their medications: No  Patient reports access/transportation concerns to their pharmacy: No  Patient reports adherence concerns with their medications:  No     Diabetes: Followed by endo - Dr. Lonzo Cloud Current medications: Marcelline Deist 10 mg daily, Novolog max daily 120 units via insulin pump, metformin 1000 mg twice daily, Mounjaro 5 mg weekly (just increased yesterday, has not started yet)  Pt notes she had been having issues with her sensors which she believes was contributing to her elevated BG. This issue is now resolved. She was on Ozempic 2 mg until 12/2022, stopped due to GI issues. A1c increased significantly on the next A1c  Hyperlipidemia/ASCVD Risk Reduction:  Current lipid lowering medications: rosuvastatin 10 mg daily, ezetimibe 10 mg daily Medications tried in the past: rosuvastatin 20 mg, atorvastatin 40 mg and 80 mg, pravastatin 40 mg, simvastatin 40 mg   The 10-year ASCVD risk score (Arnett DK, et al., 2019) is: 27.2%   Values used to calculate the score:     Age: 22 years     Sex: Female     Is Non-Hispanic African American: Yes     Diabetic: Yes     Tobacco smoker: No     Systolic Blood Pressure: 132 mmHg     Is BP treated: Yes      HDL Cholesterol: 44.9 mg/dL     Total Cholesterol: 284 mg/dL   *Pt asks about Repatha and if that could be a possibility for her. Her husband is on it and his numbers improved to goal.  Objective:  Lab Results  Component Value Date   HGBA1C 10.2 (H) 06/09/2023    Lab Results  Component Value Date   CREATININE 0.63 06/09/2023   BUN 10 06/09/2023   NA 139 06/09/2023   K 3.8 06/09/2023   CL 101 06/09/2023   CO2 28 06/09/2023    Lab Results  Component Value Date   CHOL 284 (H) 06/01/2022   HDL 44.90 06/01/2022   LDLCALC 207 (H) 06/01/2022   TRIG 162.0 (H) 06/01/2022   CHOLHDL 6 06/01/2022    Medications Reviewed Today     Reviewed by Bonita Quin, RPH (Pharmacist) on 07/14/23 at 1055  Med List Status: <None>   Medication Order Taking? Sig Documenting Provider Last Dose Status Informant  albuterol (VENTOLIN HFA) 108 (90 Base) MCG/ACT inhaler [id number]  Inhale 2 puffs into the lungs every 6 (six) hours as needed for wheezing or shortness of breath. Jetty Duhamel D, MD  Active Self  Cholecalciferol (VITAMIN D3 PO) [id number]  Take 1 tablet by mouth daily. [provider]  Active Self  Continuous Glucose Monitor DEVI [id number]  Use as directed. John Giovanni, MD  Active Self  Continuous Glucose Sensor (DEXCOM G7 SENSOR) [place] [id number]  1 Device by Does not apply route  as directed. Shamleffer, Konrad Dolores, MD  Active   Cyanocobalamin (B-12 PO) [id number]  Take 1 tablet by mouth daily. [provider]  Active Self  dapagliflozin propanediol (FARXIGA) 10 MG TABS tablet [id number] Yes Take 1 tablet (10 mg total) by mouth daily. Shamleffer, Konrad Dolores, MD Taking Active   diclofenac Sodium (VOLTAREN) 1 % GEL [id number]  Apply 2 g topically 4 (four) times daily. Rub into affected area of foot 2 to 4 times daily  Patient taking differently: Apply 2 g topically 4 (four) times daily as needed (pain).   Vivi Barrack, DPM  Active Self  dicyclomine  (BENTYL) 10 MG capsule [id number]  TAKE 1 CAPSULE BY MOUTH THREE TIMES DAILY BEFORE MEAL(S) Esterwood, Amy S, PA-C  Active Self  esomeprazole (NEXIUM) 40 MG capsule [id number]  Take 1 capsule (40 mg total) by mouth daily. Meryl Dare, MD  Active   estradiol [facility] VAGINAL) 0.1 MG/GM vaginal cream [id number]  Place 1 Applicatorful vaginally at bedtime. Etta Grandchild, MD  Active   ezetimibe (ZETIA) 10 MG tablet [id number] Yes Take 1 tablet (10 mg total) by mouth daily. Shamleffer, Konrad Dolores, MD Taking Active   famotidine (PEPCID) 20 MG tablet [id number]  Take 1 tablet (20 mg total) by mouth 2 (two) times daily as needed. Esterwood, Amy S, PA-C  Active Self  insulin aspart (NOVOLOG) 100 UNIT/ML injection [id number] Yes MAX DAILY 120 UNITS  DX E11.65 Shamleffer, Konrad Dolores, MD Taking Active   Insulin Disposable Pump (OMNIPOD 5 DEXG7G6 PODS GEN 5) MISC [id number] Yes 1 Device by Does not apply route daily. Shamleffer, Konrad Dolores, MD Taking Active   letrozole [facility]) 2.5 MG tablet [id number]  Take 1 tablet by mouth once daily Serena Croissant, MD  Active   lidocaine (LIDODERM) 5 % [id number]  Place 1 patch onto the skin daily. Remove & Discard patch within 12 hours or as directed by MD Wynetta Fines, MD  Active            Med Note Earlene Plater, [place]   Wed Mar 29, 2023 11:29 AM) As needed  metFORMIN (GLUCOPHAGE) 1000 MG tablet [id number] Yes TAKE 1 TABLET BY MOUTH TWICE DAILY WITH A MEAL Shamleffer, Konrad Dolores, MD Taking Active   Misc Natural Products [facility] IMMUNE COMPLEX) CHEW [id number]  Chew 1 tablet by mouth daily. [provider]  Active Self  montelukast (SINGULAIR) 10 MG tablet [id number]  TAKE 1 TABLET BY MOUTH AT BEDTIME Glenford Bayley, NP  Active   Multiple Vitamin (MULTIVITAMIN WITH MINERALS) TABS tablet [id number]  Take 1 tablet by mouth daily. [provider]  Active Self  NARCAN 4 MG/0.1ML LIQD nasal spray kit [id number]  Place 0.4 mg into the nose  once. [provider]  Active Self  oxyCODONE-acetaminophen (PERCOCET) 10-325 MG tablet [id number]  Take 1 tablet by mouth every 8 (eight) hours as needed for pain. [provider]  Active Self           Med Note Alphonzo Dublin   Tue May 17, 2022  3:29 PM)    pregabalin (LYRICA) 150 MG capsule [id number]  Take 150 mg by mouth 2 (two) times daily. [provider]  Active   rosuvastatin (CRESTOR) 10 MG tablet [id number] Yes Take 1 tablet (10 mg total) by mouth daily. Shamleffer, Konrad Dolores, MD Taking Active   SYMBICORT 160-4.5 MCG/ACT inhaler [id number]  Inhale 2 puffs by mouth twice daily Waymon Budge, MD  Active   tirzepatide [facility])  5 MG/0.5ML Pen [id number] Yes Inject 5 mg into the skin once a week. Shamleffer, Konrad Dolores, MD Taking Active   tiZANidine (ZANAFLEX) 4 MG capsule [id number]  Take 4 mg by mouth 3 (three) times daily as needed for muscle spasms. [provider]  Active Self  valACYclovir (VALTREX) 500 MG tablet [id number]  TAKE 1 TABLET BY MOUTH TWICE DAILY FOR 5 DAYS AT  TIME  OF  OUTBREAK Etta Grandchild, MD  Active   valsartan (DIOVAN) 320 MG tablet [id number] Yes Take 1 tablet (320 mg total) by mouth daily. Corwin Levins, MD Taking Active   Vitamin D, Ergocalciferol, (DRISDOL) 1.25 MG (50000 UNIT) CAPS capsule [id number]  Take 50,000 Units by mouth once a week. [provider]  Active   Med List Note Jean Rosenthal, [place] 01/23/13 1034): Patient is on C-PAP at night              Assessment/Plan:   Diabetes: - Currently uncontrolled, Goal A1c <7% - Recommend to continue follow up with endo - Hopefully can titrate up Mounjaro to help with BG control    Hyperlipidemia/ASCVD Risk Reduction: - Currently uncontrolled. Goal LDL <70. - Lipid panel is out dated. Pt will come next week for fasting labs for lipid check - May be able to get Repatha covered if LDL remains very elevated despite highest tolerated  statin.   Follow Up Plan: will call after lab results  Arbutus Leas, PharmD, BCPS, CPP Clinical Pharmacist Practitioner [facility] [phone]

## 2023-07-14 NOTE — Telephone Encounter (Signed)
Contacted patient for 9 AM clinical pharmacist telephone appt. No answer, left message with direct call back number.  Arbutus Leas, PharmD, BCPS, CPP Clinical Pharmacist Practitioner [place] [facility] [phone]

## 2023-07-14 NOTE — Patient Instructions (Signed)
It was a pleasure speaking with you today!  Come next week for fasting labs to check your cholesterol levels.  Feel free to call with any questions or concerns!  Arbutus Leas, PharmD, BCPS, CPP Clinical Pharmacist Practitioner [place] [place] [facility] [phone]

## 2023-07-18 ENCOUNTER — Other Ambulatory Visit (INDEPENDENT_AMBULATORY_CARE_PROVIDER_SITE_OTHER): Payer: PPO

## 2023-07-18 ENCOUNTER — Other Ambulatory Visit: Payer: Self-pay | Admitting: Internal Medicine

## 2023-07-18 DIAGNOSIS — E785 Hyperlipidemia, unspecified: Secondary | ICD-10-CM | POA: Diagnosis not present

## 2023-07-18 DIAGNOSIS — E1169 Type 2 diabetes mellitus with other specified complication: Secondary | ICD-10-CM

## 2023-07-18 DIAGNOSIS — N952 Postmenopausal atrophic vaginitis: Secondary | ICD-10-CM

## 2023-07-18 LAB — LIPID PANEL
Cholesterol: 139 mg/dL (ref 0–200)
HDL: 44.7 mg/dL (ref >39.00)
LDL Cholesterol: 76 mg/dL (ref 0–99)
NonHDL: 93.82
Total CHOL/HDL Ratio: 3
Triglycerides: 90 mg/dL (ref 0.0–149.0)
VLDL: 18 mg/dL (ref 0.0–40.0)

## 2023-07-19 ENCOUNTER — Other Ambulatory Visit: Payer: Self-pay | Admitting: Pharmacist

## 2023-07-19 DIAGNOSIS — E785 Hyperlipidemia, unspecified: Secondary | ICD-10-CM

## 2023-07-19 MED ORDER — REPATHA SURECLICK 140 MG/ML ~~LOC~~ SOAJ: 140.0000 mg | SUBCUTANEOUS | 2 refills | Status: DC

## 2023-07-19 NOTE — Progress Notes (Signed)
 07/19/2023 Name: [name] MRN: [id number] DOB: 1960-07-23  Chief Complaint  Patient presents with   TNM Diabetes    [name] is a 63 y.o. year old female who presented for a telephone visit.   They were referred to the pharmacist by a quality report for assistance in managing diabetes.   Subjective:  Care Team: Primary Care Provider: Purcell Emil Schanz, MD ; Next Scheduled Visit: 11/14/23 Endocrinologist Shamleffer; Next Scheduled Visit: 10/17/2023  Medication Access/Adherence  Current Pharmacy:  [id number] - [place] ([place]), [place] - [address] [place] ([place]) [place] [address] Phone: [phone] Fax: [phone]   Patient reports affordability concerns with their medications: No  Patient reports access/transportation concerns to their pharmacy: No  Patient reports adherence concerns with their medications:  No     Diabetes: Followed by endo - Dr. Sam Current medications: Farxiga  10 mg daily, Novolog  max daily 120 units via insulin  pump, metformin  1000 mg twice daily, Mounjaro  5 mg weekly (just increased, started 2/2)  Pt notes she had been having issues with her sensors which she believes was contributing to her elevated BG. This issue is now resolved. She was on Ozempic  2 mg until 12/2022, stopped due to GI issues. A1c increased significantly on the next A1c  Hyperlipidemia/ASCVD Risk Reduction:  Current lipid lowering medications: rosuvastatin  10 mg daily, ezetimibe  10 mg daily Medications tried in the past: rosuvastatin  20 mg, atorvastatin  40 mg and 80 mg, pravastatin  40 mg, simvastatin  40 mg  Pt does report some pain - however difficult to tell if cause is due to PT, OA, or statin mylagia  The 10-year ASCVD risk score (Arnett DK, et al., 2019) is: 15%   Values used to calculate the score:     Age: 50 years     Sex: Female     Is Non-Hispanic African American: Yes     Diabetic: Yes     Tobacco smoker: No      Systolic Blood Pressure: 132 mmHg     Is BP treated: Yes     HDL Cholesterol: 44.7 mg/dL     Total Cholesterol: 139 mg/dL   *Pt asks about Repatha  and if that could be a possibility for her. Her husband is on it and his numbers improved to goal.  Objective:  Lab Results  Component Value Date   HGBA1C 10.2 (H) 06/09/2023    Lab Results  Component Value Date   CREATININE 0.63 06/09/2023   BUN 10 06/09/2023   NA 139 06/09/2023   K 3.8 06/09/2023   CL 101 06/09/2023   CO2 28 06/09/2023    Lab Results  Component Value Date   CHOL 139 07/18/2023   HDL 44.70 07/18/2023   LDLCALC 76 07/18/2023   TRIG 90.0 07/18/2023   CHOLHDL 3 07/18/2023    Medications Reviewed Today   Medications were not reviewed in this encounter       Assessment/Plan:   Diabetes: - Currently uncontrolled, Goal A1c <7% - Recommend to continue follow up with endo - Hopefully can titrate up Mounjaro  to help with BG control    Hyperlipidemia/ASCVD Risk Reduction: - Currently uncontrolled. Goal LDL <70. - Lipid panel updated and much improved from previous.  - May be able to get Repatha  covered if LDL remains >70 despite highest tolerated statin. Pt wishes to try. If starts repatha , recommend d/c ezetimibe    Follow Up Plan: 08/22/23  Darrelyn Drum, PharmD, BCPS, CPP Clinical Pharmacist Practitioner [place]  Primary Care at [facility] [phone]

## 2023-07-20 ENCOUNTER — Other Ambulatory Visit (HOSPITAL_COMMUNITY): Payer: Self-pay

## 2023-07-20 ENCOUNTER — Encounter: Payer: Self-pay | Admitting: Radiology

## 2023-07-20 ENCOUNTER — Telehealth: Payer: Self-pay

## 2023-07-20 NOTE — Telephone Encounter (Signed)
 Pharmacy Patient Advocate Encounter   Received notification from Physician's Office that prior authorization for Repatha  SureClick 140MG /ML auto-injectors is required/requested.   Insurance verification completed.   The patient is insured through [facility] ADVANTAGE/RX ADVANCE .   Per test claim: PA required and submitted KEY/EOC/Request #: [id number] APPROVED from 07/20/23 to 01/16/24. Ran test claim, Copay is $12.15. This test claim was processed through [facility]- copay amounts may vary at other pharmacies due to pharmacy/plan contracts, or as the patient moves through the different stages of their insurance plan.

## 2023-07-20 NOTE — Telephone Encounter (Signed)
-----   Message from Almedia Arista sent at 07/19/2023  4:31 PM EST ----- I have sent an rx for repatha , which will probably require PA

## 2023-07-21 ENCOUNTER — Other Ambulatory Visit (HOSPITAL_COMMUNITY): Payer: Self-pay

## 2023-07-21 DIAGNOSIS — E1165 Type 2 diabetes mellitus with hyperglycemia: Secondary | ICD-10-CM | POA: Diagnosis not present

## 2023-07-25 DIAGNOSIS — I10 Essential (primary) hypertension: Secondary | ICD-10-CM | POA: Diagnosis not present

## 2023-07-25 DIAGNOSIS — E559 Vitamin D deficiency, unspecified: Secondary | ICD-10-CM | POA: Diagnosis not present

## 2023-07-25 DIAGNOSIS — E119 Type 2 diabetes mellitus without complications: Secondary | ICD-10-CM | POA: Diagnosis not present

## 2023-07-25 DIAGNOSIS — R768 Other specified abnormal immunological findings in serum: Secondary | ICD-10-CM | POA: Diagnosis not present

## 2023-07-25 DIAGNOSIS — M51362 Other intervertebral disc degeneration, lumbar region with discogenic back pain and lower extremity pain: Secondary | ICD-10-CM | POA: Diagnosis not present

## 2023-07-25 DIAGNOSIS — M129 Arthropathy, unspecified: Secondary | ICD-10-CM | POA: Diagnosis not present

## 2023-07-25 DIAGNOSIS — M25552 Pain in left hip: Secondary | ICD-10-CM | POA: Diagnosis not present

## 2023-07-25 DIAGNOSIS — Z79899 Other long term (current) drug therapy: Secondary | ICD-10-CM | POA: Diagnosis not present

## 2023-07-25 DIAGNOSIS — Z0000 Encounter for general adult medical examination without abnormal findings: Secondary | ICD-10-CM | POA: Diagnosis not present

## 2023-07-28 DIAGNOSIS — Z79899 Other long term (current) drug therapy: Secondary | ICD-10-CM | POA: Diagnosis not present

## 2023-08-01 ENCOUNTER — Telehealth: Payer: Self-pay

## 2023-08-01 ENCOUNTER — Other Ambulatory Visit: Payer: Self-pay | Admitting: Primary Care

## 2023-08-01 MED ORDER — ESOMEPRAZOLE MAGNESIUM 40 MG PO CPDR: 40.0000 mg | DELAYED_RELEASE_CAPSULE | Freq: Every day | ORAL | 1 refills | Status: DC

## 2023-08-01 NOTE — Telephone Encounter (Signed)
Received a fax from [facility] requesting a refill of esomeprazole 40 mg daily. Previous Dr. Russella Dar patient. Dr. Leone Payor, you are DOD. Can I refill prescription?

## 2023-08-01 NOTE — Telephone Encounter (Signed)
OK to refill esomeprazole x 1 year

## 2023-08-01 NOTE — Progress Notes (Signed)
Needs visit for Singulair refill

## 2023-08-01 NOTE — Telephone Encounter (Signed)
Prescription sent to [facility].

## 2023-08-03 ENCOUNTER — Telehealth: Payer: Self-pay

## 2023-08-03 ENCOUNTER — Other Ambulatory Visit: Payer: Self-pay | Admitting: Emergency Medicine

## 2023-08-03 ENCOUNTER — Other Ambulatory Visit: Payer: Self-pay

## 2023-08-03 DIAGNOSIS — E1165 Type 2 diabetes mellitus with hyperglycemia: Secondary | ICD-10-CM

## 2023-08-03 DIAGNOSIS — E785 Hyperlipidemia, unspecified: Secondary | ICD-10-CM

## 2023-08-03 MED ORDER — TIRZEPATIDE 5 MG/0.5ML ~~LOC~~ SOAJ: 5.0000 mg | SUBCUTANEOUS | 3 refills | Status: DC

## 2023-08-03 MED ORDER — ROSUVASTATIN CALCIUM 10 MG PO TABS: 10.0000 mg | ORAL_TABLET | Freq: Every day | ORAL | 3 refills | Status: DC

## 2023-08-03 MED ORDER — DEXCOM G7 SENSOR MISC: 1.0000 | 3 refills | Status: DC

## 2023-08-03 MED ORDER — OMNIPOD 5 DEXG7G6 PODS GEN 5 MISC
1.0000 | Freq: Every day | 2 refills | Status: DC
Start: 2023-08-03 — End: 2023-08-10

## 2023-08-03 NOTE — Telephone Encounter (Signed)
Copied from CRM [id number]. Topic: Clinical - Prescription Issue >> Aug 03, 2023  2:53 PM Elizebeth Brooking wrote: Reason for CRM: Jae Dire from [employer] called in stated she sent over a fax requesting the patients prescriptions and would like for them to be escribed to them

## 2023-08-03 NOTE — Telephone Encounter (Signed)
Copied from CRM [id number]. Topic: Clinical - Medication Refill >> Aug 03, 2023  3:21 PM Sim Boast F wrote: Most Recent Primary Care Visit:  Provider: [facility]  Department[facility]  Visit Type: LAB  Date: 07/18/2023  Medication: valsartan   Has the patient contacted their pharmacy? [facility] called   (Agent: If no, request that the patient contact the pharmacy for the refill. If patient does not wish to contact the pharmacy document the reason why and proceed with request.) (Agent: If yes, when and what did the pharmacy advise?)  Is this the correct pharmacy for this prescription? Yes If no, delete pharmacy and type the correct one.  This is the patient's preferred pharmacy:    [facility] - [place], [place] - [address] [place] [address] Phone: [phone] Fax: [phone]   Has the prescription been filled recently? Yes  Is the patient out of the medication? N/A  Has the patient been seen for an appointment in the last year OR does the patient have an upcoming appointment? Yes  Can we respond through MyChart? Yes  Agent: Please be advised that Rx refills may take up to 3 business days. We ask that you follow-up with your pharmacy.

## 2023-08-04 DIAGNOSIS — R310 Gross hematuria: Secondary | ICD-10-CM | POA: Diagnosis not present

## 2023-08-04 DIAGNOSIS — R3121 Asymptomatic microscopic hematuria: Secondary | ICD-10-CM | POA: Diagnosis not present

## 2023-08-04 DIAGNOSIS — N3289 Other specified disorders of bladder: Secondary | ICD-10-CM | POA: Diagnosis not present

## 2023-08-04 NOTE — Telephone Encounter (Signed)
Forms received and placed in providers office to be signed

## 2023-08-07 ENCOUNTER — Telehealth: Payer: Self-pay | Admitting: Internal Medicine

## 2023-08-07 ENCOUNTER — Encounter: Payer: Self-pay | Admitting: Family Medicine

## 2023-08-07 ENCOUNTER — Ambulatory Visit: Payer: Self-pay | Admitting: Emergency Medicine

## 2023-08-07 ENCOUNTER — Ambulatory Visit (INDEPENDENT_AMBULATORY_CARE_PROVIDER_SITE_OTHER): Payer: PPO | Admitting: Family Medicine

## 2023-08-07 VITALS — BP 148/82 | HR 84 | Temp 97.1°F | Resp 18 | Wt 258.0 lb

## 2023-08-07 DIAGNOSIS — G4733 Obstructive sleep apnea (adult) (pediatric): Secondary | ICD-10-CM | POA: Diagnosis not present

## 2023-08-07 DIAGNOSIS — R109 Unspecified abdominal pain: Secondary | ICD-10-CM | POA: Diagnosis not present

## 2023-08-07 MED ORDER — KETOROLAC TROMETHAMINE 60 MG/2ML IM SOLN
60.0000 mg | Freq: Once | INTRAMUSCULAR | Status: AC
  Administered 2023-08-07: 60 mg via INTRAMUSCULAR

## 2023-08-07 NOTE — Progress Notes (Signed)
 Established Patient Office Visit   Subjective:  Patient ID: [name], female    DOB: [birth year]  Age: 63 y.o. MRN: [id number]  Chief Complaint  Patient presents with   Back Pain    Right mid back pain x 3 weeks.     HPI Encounter Diagnoses  Name Primary?   Right flank pain Yes   Abdominal pain, unspecified abdominal location    3-week history of ongoing pain in the right mid back area.  No injury.  No history of lithiasis or hematuria.  Denies any difficulty with urination.  No problems with constipation.  Ongoing chronic lower back pain.   Review of Systems  Constitutional: Negative.   HENT: Negative.    Eyes:  Negative for blurred vision, discharge and redness.  Respiratory: Negative.    Cardiovascular: Negative.   Gastrointestinal:  Negative for abdominal pain, blood in stool, constipation and melena.  Genitourinary: Negative.  Negative for dysuria, frequency, hematuria and urgency.  Musculoskeletal: Negative.  Negative for myalgias.  Skin:  Negative for rash.  Neurological:  Negative for tingling, loss of consciousness and weakness.  Endo/Heme/Allergies:  Negative for polydipsia.     Current Outpatient Medications:    albuterol (VENTOLIN HFA) 108 (90 Base) MCG/ACT inhaler, Inhale 2 puffs into the lungs every 6 (six) hours as needed for wheezing or shortness of breath., Disp: 18 g, Rfl: 12   Cholecalciferol (VITAMIN D3 PO), Take 1 tablet by mouth daily., Disp: , Rfl:    Continuous Glucose Monitor DEVI, Use as directed., Disp: 1 each, Rfl: 0   Continuous Glucose Sensor (DEXCOM G7 SENSOR) MISC, 1 Device by Does not apply route as directed., Disp: 9 each, Rfl: 3   Cyanocobalamin (B-12 PO), Take 1 tablet by mouth daily., Disp: , Rfl:    dapagliflozin propanediol (FARXIGA) 10 MG TABS tablet, Take 1 tablet (10 mg total) by mouth daily., Disp: 90 tablet, Rfl: 3   diclofenac Sodium (VOLTAREN) 1 % GEL, Apply 2 g topically 4 (four) times daily. Rub into affected area of foot  2 to 4 times daily (Patient taking differently: Apply 2 g topically 4 (four) times daily as needed (pain).), Disp: 100 g, Rfl: 2   dicyclomine (BENTYL) 10 MG capsule, TAKE 1 CAPSULE BY MOUTH THREE TIMES DAILY BEFORE MEAL(S), Disp: 90 capsule, Rfl: 11   esomeprazole (NEXIUM) 40 MG capsule, Take 1 capsule (40 mg total) by mouth daily., Disp: 90 capsule, Rfl: 1   estradiol (ESTRACE) 0.1 MG/GM vaginal cream, INSERT 1 APPLICATORFUL VAGINALLY  AT BEDTIME, Disp: 43 g, Rfl: 0   Evolocumab (REPATHA SURECLICK) 140 MG/ML SOAJ, Inject 140 mg into the skin every 14 (fourteen) days., Disp: 2 mL, Rfl: 2   ezetimibe (ZETIA) 10 MG tablet, Take 1 tablet (10 mg total) by mouth daily., Disp: 90 tablet, Rfl: 3   famotidine (PEPCID) 20 MG tablet, Take 1 tablet (20 mg total) by mouth 2 (two) times daily as needed., Disp: 60 tablet, Rfl: 11   insulin aspart (NOVOLOG) 100 UNIT/ML injection, MAX DAILY 120 UNITS  DX E11.65, Disp: 110 mL, Rfl: 3   Insulin Disposable Pump (OMNIPOD 5 DEXG7G6 PODS GEN 5) MISC, 1 Device by Does not apply route daily., Disp: 90 each, Rfl: 2   letrozole (FEMARA) 2.5 MG tablet, Take 1 tablet by mouth once daily, Disp: 90 tablet, Rfl: 0   lidocaine (LIDODERM) 5 %, Place 1 patch onto the skin daily. Remove & Discard patch within 12 hours or as directed by MD, Disp:  30 patch, Rfl: 0   metFORMIN (GLUCOPHAGE) 1000 MG tablet, TAKE 1 TABLET BY MOUTH TWICE DAILY WITH A MEAL, Disp: 180 tablet, Rfl: 1   Misc Natural Products (ELDERBERRY IMMUNE COMPLEX) CHEW, Chew 1 tablet by mouth daily., Disp: , Rfl:    montelukast (SINGULAIR) 10 MG tablet, TAKE 1 TABLET BY MOUTH AT BEDTIME, Disp: 90 tablet, Rfl: 3   Multiple Vitamin (MULTIVITAMIN WITH MINERALS) TABS tablet, Take 1 tablet by mouth daily., Disp: , Rfl:    NARCAN 4 MG/0.1ML LIQD nasal spray kit, Place 0.4 mg into the nose once., Disp: , Rfl:    oxyCODONE-acetaminophen (PERCOCET) 10-325 MG tablet, Take 1 tablet by mouth every 8 (eight) hours as needed for pain.,  Disp: , Rfl:    pregabalin (LYRICA) 150 MG capsule, Take 150 mg by mouth 2 (two) times daily., Disp: , Rfl:    rosuvastatin (CRESTOR) 10 MG tablet, Take 1 tablet (10 mg total) by mouth daily., Disp: 90 tablet, Rfl: 3   SYMBICORT 160-4.5 MCG/ACT inhaler, Inhale 2 puffs by mouth twice daily, Disp: 11 g, Rfl: 2   tirzepatide (MOUNJARO) 5 MG/0.5ML Pen, Inject 5 mg into the skin once a week., Disp: 6 mL, Rfl: 3   tiZANidine (ZANAFLEX) 4 MG capsule, Take 4 mg by mouth 3 (three) times daily as needed for muscle spasms., Disp: , Rfl:    valACYclovir (VALTREX) 500 MG tablet, TAKE 1 TABLET BY MOUTH TWICE DAILY FOR 5 DAYS AT  TIME  OF  OUTBREAK, Disp: 20 tablet, Rfl: 3   valsartan (DIOVAN) 320 MG tablet, Take 1 tablet (320 mg total) by mouth daily., Disp: 90 tablet, Rfl: 3   Vitamin D, Ergocalciferol, (DRISDOL) 1.25 MG (50000 UNIT) CAPS capsule, Take 50,000 Units by mouth once a week., Disp: , Rfl:   Current Facility-Administered Medications:    ketorolac (TORADOL) injection 60 mg, 60 mg, Intramuscular, Once,    Objective:     BP (!) 148/82 (BP Location: Left Arm, Patient Position: Sitting, Cuff Size: Large)   Pulse 84   Temp (!) 97.1 F (36.2 C) (Temporal)   Resp 18   Wt 258 lb (117 kg)   SpO2 97%   BMI 44.29 kg/m    Physical Exam Constitutional:      General: She is not in acute distress.    Appearance: Normal appearance. She is not ill-appearing, toxic-appearing or diaphoretic.  HENT:     Head: Normocephalic and atraumatic.     Right Ear: External ear normal.     Left Ear: External ear normal.  Eyes:     General: No scleral icterus.       Right eye: No discharge.        Left eye: No discharge.     Extraocular Movements: Extraocular movements intact.     Conjunctiva/sclera: Conjunctivae normal.     Pupils: Pupils are equal, round, and reactive to light.  Cardiovascular:     Rate and Rhythm: Normal rate and regular rhythm.  Pulmonary:     Effort: Pulmonary effort is normal. No  respiratory distress.     Breath sounds: Normal breath sounds. No wheezing or rales.  Abdominal:     General: Bowel sounds are normal.     Tenderness: There is right CVA tenderness. There is no left CVA tenderness.  Musculoskeletal:     Cervical back: No rigidity or tenderness.  Skin:    General: Skin is warm and dry.  Neurological:     Mental Status: She is alert and  oriented to person, place, and time.  Psychiatric:        Mood and Affect: Mood normal.        Behavior: Behavior normal.      No results found for any visits on 08/07/23.    The 10-year ASCVD risk score (Arnett DK, et al., 2019) is: 20%    Assessment & Plan:   Right flank pain -     Urinalysis, Routine w reflex microscopic -     CT RENAL STONE STUDY; Future -     Basic metabolic panel -     CBC -     Ketorolac Tromethamine  Abdominal pain, unspecified abdominal location -     CT RENAL STONE STUDY; Future    Return Plan follow-up with Dr. Alvy Bimler.  With time constraints regarding out-of-town travel plan for Sunday, would consider going to emergency room.  Mliss Sax, MD

## 2023-08-07 NOTE — Telephone Encounter (Signed)
 Patient needs a CPAP follow up before the end of February 2025. Due to insurance paying for CPAP machine. Can we use one of Dr.Young's RNA slots? Patient phone number is [phone].

## 2023-08-07 NOTE — Telephone Encounter (Signed)
 Dr. Maple Hudson, can we use one of your RNA slots this week to get patient in for a CPAP compliance check appointments?  Either 11:30 am or 4:30 pm? Please advise.  Thank you.

## 2023-08-07 NOTE — Telephone Encounter (Signed)
 Chief Complaint: back pain Symptoms: R thoracic/mid back pain Frequency: 2-3 wks Pertinent Negatives: Patient denies fever, numbness/tingling, bowel/bladder problem, CP, SOB Disposition: [] ED /[] Urgent Care (no appt availability in office) / [x] Appointment(In office/virtual)/ []  [facility]/ [] Home Care/ [] Refused Recommended Disposition /[] [facility]/ []  Follow-up with PCP Additional Notes: 2-3 weeks of R sided middle/thoracic area back pain. No recent injury. Using pain relieving cream with no effect. No numbness or tingling. Still able to walk, just painful. No trouble with bowel or bladder. No CP or SOB. Pt has a cruise upcoming this week and wants pain under control prior to leaving. No urinary symptoms. Per protocol pt scheduled today at 1500 at [facility]. Pt advised to call back for any worsening, she verbalized understanding.   Copied from CRM [id number]. Topic: Clinical - Red Word Triage >> Aug 07, 2023  9:15 AM Deaijah H wrote: Red Word that prompted transfer to Nurse Triage: Right side of back, super painful to where she can barely move (extreme) Reason for Disposition  [1] SEVERE back pain (e.g., excruciating, unable to do any normal activities) AND [2] not improved 2 hours after pain medicine  Answer Assessment - Initial Assessment Questions 1. ONSET: "When did the pain begin?"      2-3 wks 2. LOCATION: "Where does it hurt?" (upper, mid or lower back)     Middle R side thoracic area 3. SEVERITY: "How bad is the pain?"  (e.g., Scale 1-10; mild, moderate, or severe)   - MILD (1-3): Doesn't interfere with normal activities.    - MODERATE (4-7): Interferes with normal activities or awakens from sleep.    - SEVERE (8-10): Excruciating pain, unable to do any normal activities.      10/10 - able to walk with severe pain 4. PATTERN: "Is the pain constant?" (e.g., yes, no; constant, intermittent)      Constant 5. RADIATION: "Does the pain shoot into your legs  or somewhere else?"     No 6. CAUSE:  "What do you think is causing the back pain?"      Not sure 7. BACK OVERUSE:  "Any recent lifting of heavy objects, strenuous work or exercise?"     No 8. MEDICINES: "What have you taken so far for the pain?" (e.g., nothing, acetaminophen, NSAIDS)     Pain relieving cream, only slightly helpful 9. NEUROLOGIC SYMPTOMS: "Do you have any weakness, numbness, or problems with bowel/bladder control?"     No 10. OTHER SYMPTOMS: "Do you have any other symptoms?" (e.g., fever, abdomen pain, burning with urination, blood in urine)       No - worse after sitting a long time. "I have a bad back but this is totally different. No CP, no SOB, no dizziness/weakness, no N/V, "sweaty sometimes but I think that's when I take medicine." No urinary symptoms. No fever.  Protocols used: Back Pain-A-AH

## 2023-08-08 LAB — BASIC METABOLIC PANEL
BUN: 8 mg/dL (ref 6–23)
CO2: 28 meq/L (ref 19–32)
Calcium: 9.3 mg/dL (ref 8.4–10.5)
Chloride: 102 meq/L (ref 96–112)
Creatinine, Ser: 0.72 mg/dL (ref 0.40–1.20)
GFR: 89.25 mL/min (ref >60.00)
Glucose, Bld: 135 mg/dL — ABNORMAL HIGH (ref 70–99)
Potassium: 3.4 meq/L — ABNORMAL LOW (ref 3.5–5.1)
Sodium: 141 meq/L (ref 135–145)

## 2023-08-08 LAB — URINALYSIS, ROUTINE W REFLEX MICROSCOPIC
Bilirubin Urine: NEGATIVE
Hgb urine dipstick: NEGATIVE
Ketones, ur: NEGATIVE
Leukocytes,Ua: NEGATIVE
Nitrite: NEGATIVE
RBC / HPF: NONE SEEN (ref 0–?)
Specific Gravity, Urine: 1.01 (ref 1.000–1.030)
Total Protein, Urine: NEGATIVE
Urine Glucose: >=1000 — AB
Urobilinogen, UA: 0.2 (ref 0.0–1.0)
pH: 6 (ref 5.0–8.0)

## 2023-08-08 LAB — CBC
HCT: 41.5 % (ref 36.0–46.0)
Hemoglobin: 13.4 g/dL (ref 12.0–15.0)
MCHC: 32.2 g/dL (ref 30.0–36.0)
MCV: 80.6 fL (ref 78.0–100.0)
Platelets: 216 10*3/uL (ref 150.0–400.0)
RBC: 5.16 Mil/uL — ABNORMAL HIGH (ref 3.87–5.11)
RDW: 17.2 % — ABNORMAL HIGH (ref 11.5–15.5)
WBC: 7.4 10*3/uL (ref 4.0–10.5)

## 2023-08-08 NOTE — Telephone Encounter (Signed)
 Patient scheduled for 08/10/2023 at 11:30 am

## 2023-08-08 NOTE — Telephone Encounter (Signed)
 Yes, ok

## 2023-08-09 NOTE — Progress Notes (Unsigned)
 HPI F former smoker followed for OSA, complicated by  HBP, COPD GOLD II, Hepatic steatosis, GERD, DM2, Lung nodule< 6 cm NPSG 08/11/17- AHI 17.7/ hr, desaturation to 85%, 255 lbs PFT- 01/31/17- moderate obst, insignif resp to BD, mild DLCO deficit -----------------------------------------------------------------------------------   11/29/22- 62 yoF former smoker followed for OSA, complicated by  HTN, COPD GOLD II, Hepatic steatosis, GERD, DM2, Sciatica, Morbid Obesity, Chronic Low Back Pain/ [facility], Breast Cancer R,  Dr Sherene Sires has seen her for cough -Ventiolin, Symbicort 160, ChlorTrimeton, Singulair,  CPAP auto 10-20/ [facility] Download- compliance 100%, AHI 0.1/ hr Body weight today-241 lbs Download reviewed.  Doing very well with CPAP. Still complains of throat clearing on Nexium plus Pepcid twice daily.  Cloudy yellow mucus in back of throat and gets some frontal headaches.  08/10/23- 66 yoF former smoker followed for OSA, complicated by  HTN, COPD GOLD II, Hepatic steatosis, GERD, DM2, Sciatica, Morbid Obesity, Chronic Low Back Pain/ [facility], Breast Cancer R,  Dr Sherene Sires has seen her for cough CAT score 10 -Ventiolin, Symbicort 160, ChlorTrimeton, Singulair,  CPAP auto 10-20/ [facility]    AirSense 11, replaced Dec, 2024 Download- compliance 97%, AHI 0.1/hr Body weight today-255 lbs -----Wearing CPAP-doing well, needs supplies, breathing is good Continues Symbicort and using albuterol about 1 x/ week Got replacement CPAP machine in December and pleased with it.  Download reviewed Needs supplies authorized. COPD control good with no recent exacerbation. Needs refills. Uses rescue inhaler occasionally. CXR 04/06/23- IMPRESSION: No evidence of active cardiopulmonary disease.  ROS-see HPI   + = positive Constitutional:    weight loss, night sweats, fevers, chills, fatigue, lassitude. HEENT:   + headaches, difficulty swallowing, tooth/dental problems, sore throat,        sneezing, itching, ear ache, +nasal congestion, post nasal drip, snoring CV:    chest pain, orthopnea, PND, +swelling in lower extremities, anasarca,                                   dizziness, palpitations Resp:  + shortness of breath with exertion or at rest.                +productive cough,   non-productive cough, coughing up of blood.              change in color of mucus. + wheezing.   Skin:    rash or lesions. GI:  + heartburn, indigestion, +abdominal pain, nausea, vomiting, diarrhea,                 change in bowel habits, loss of appetite GU: dysuria, change in color of urine, no urgency or frequency.   flank pain. MS:   +joint pain, stiffness, decreased range of motion, back pain. Neuro-     nothing unusual Psych:  change in mood or affect.  depression or anxiety.   memory loss.  OBJ- Physical Exam General- Alert, Oriented, Affect-appropriate, Distress- none acute, + obese Skin- rash-none, lesions- none, excoriation- none Lymphadenopathy- none Head- atraumatic            Eyes- Gross vision intact, PERRLA, conjunctivae and secretions clear            Ears- Hearing, canals-normal            Nose- Clear, no-Septal dev, mucus, polyps, erosion, perforation             Throat- Mallampati IV ,  mucosa clear , drainage- none, tonsils- atrophic Neck- flexible , trachea midline, no stridor , thyroid nl, carotid no bruit Chest - symmetrical excursion , unlabored           Heart/CV- RRR , no murmur , no gallop  , no rub, nl s1 s2                           - JVD- none , edema- none, stasis changes- none, varices- none           Lung- clear to P&A, wheeze- none, cough- none , dullness-none, rub- none           Chest wall-  Abd-  Br/ Gen/ Rectal- Not done, not indicated Extrem- cyanosis- none, clubbing, none, atrophy- none, strength- nl Neuro- grossly intact to observation

## 2023-08-10 ENCOUNTER — Other Ambulatory Visit: Payer: Self-pay

## 2023-08-10 ENCOUNTER — Other Ambulatory Visit: Payer: Self-pay | Admitting: Emergency Medicine

## 2023-08-10 ENCOUNTER — Encounter: Payer: Self-pay | Admitting: Internal Medicine

## 2023-08-10 ENCOUNTER — Ambulatory Visit: Payer: PPO | Admitting: Internal Medicine

## 2023-08-10 VITALS — BP 138/62 | HR 85 | Temp 98.1°F | Ht 64.0 in | Wt 255.2 lb

## 2023-08-10 DIAGNOSIS — A6004 Herpesviral vulvovaginitis: Secondary | ICD-10-CM

## 2023-08-10 DIAGNOSIS — E1165 Type 2 diabetes mellitus with hyperglycemia: Secondary | ICD-10-CM

## 2023-08-10 DIAGNOSIS — G4733 Obstructive sleep apnea (adult) (pediatric): Secondary | ICD-10-CM

## 2023-08-10 DIAGNOSIS — E785 Hyperlipidemia, unspecified: Secondary | ICD-10-CM

## 2023-08-10 DIAGNOSIS — Z87891 Personal history of nicotine dependence: Secondary | ICD-10-CM

## 2023-08-10 DIAGNOSIS — E1140 Type 2 diabetes mellitus with diabetic neuropathy, unspecified: Secondary | ICD-10-CM

## 2023-08-10 DIAGNOSIS — J449 Chronic obstructive pulmonary disease, unspecified: Secondary | ICD-10-CM

## 2023-08-10 DIAGNOSIS — N952 Postmenopausal atrophic vaginitis: Secondary | ICD-10-CM

## 2023-08-10 MED ORDER — EZETIMIBE 10 MG PO TABS: 10.0000 mg | ORAL_TABLET | Freq: Every day | ORAL | 3 refills | Status: DC

## 2023-08-10 MED ORDER — MONTELUKAST SODIUM 10 MG PO TABS: 10.0000 mg | ORAL_TABLET | Freq: Every day | ORAL | 3 refills | Status: DC

## 2023-08-10 MED ORDER — ALBUTEROL SULFATE HFA 108 (90 BASE) MCG/ACT IN AERS
2.0000 | INHALATION_SPRAY | Freq: Four times a day (QID) | RESPIRATORY_TRACT | 12 refills | Status: AC | PRN
Start: 2023-08-10 — End: ?

## 2023-08-10 MED ORDER — SYMBICORT 160-4.5 MCG/ACT IN AERO: 2.0000 | INHALATION_SPRAY | Freq: Two times a day (BID) | RESPIRATORY_TRACT | 12 refills | Status: AC

## 2023-08-10 MED ORDER — OMNIPOD 5 DEXG7G6 PODS GEN 5 MISC
1.0000 | Freq: Every day | 2 refills | Status: DC
Start: 2023-08-10 — End: 2024-02-05

## 2023-08-10 MED ORDER — DAPAGLIFLOZIN PROPANEDIOL 10 MG PO TABS: 10.0000 mg | ORAL_TABLET | Freq: Every day | ORAL | 3 refills | Status: DC

## 2023-08-10 NOTE — Assessment & Plan Note (Signed)
 Well controlled with no exacerbation Plan- refill meds

## 2023-08-10 NOTE — Telephone Encounter (Signed)
 Copied from CRM [id number]. Topic: Clinical - Medication Refill >> Aug 03, 2023  3:21 PM Sim Boast F wrote: Most Recent Primary Care Visit:  Provider: [facility]  Department: [facility]  Visit Type: LAB  Date: 07/18/2023  Medication: valsartan   Has the patient contacted their pharmacy? [facility] called   (Agent: If no, request that the patient contact the pharmacy for the refill. If patient does not wish to contact the pharmacy document the reason why and proceed with request.) (Agent: If yes, when and what did the pharmacy advise?)  Is this the correct pharmacy for this prescription? Yes If no, delete pharmacy and type the correct one.  This is the patient's preferred pharmacy:    [facility] - [place], [place] - [address] [place] [address] Phone: [phone] Fax: [phone]   Has the prescription been filled recently? Yes  Is the patient out of the medication? N/A  Has the patient been seen for an appointment in the last year OR does the patient have an upcoming appointment? Yes  Can we respond through MyChart? Yes  Agent: Please be advised that Rx refills may take up to 3 business days. We ask that you follow-up with your pharmacy.

## 2023-08-10 NOTE — Telephone Encounter (Signed)
 Copied from CRM [id number]. Topic: Clinical - Medication Refill >> Aug 10, 2023  9:29 AM Marica Otter wrote: Most Recent Primary Care Visit:  Provider: [facility]  Department: [facility]  Visit Type: LAB  Date: 07/18/2023  Medication: estradiol (ESTRACE) 0.1 MG/GM vaginal cream, valACYclovir (VALTREX) 500 MG tablet   Has the patient contacted their pharmacy? Yes, Pharmacy calling (Agent: If no, request that the patient contact the pharmacy for the refill. If patient does not wish to contact the pharmacy document the reason why and proceed with request.) (Agent: If yes, when and what did the pharmacy advise?)  Is this the correct pharmacy for this prescription? Yes If no, delete pharmacy and type the correct one.  This is the patient's preferred pharmacy:   [facility] - [place] - [address] [place] [address] Phone: [phone] Fax: [phone]   Has the prescription been filled recently? No  Is the patient out of the medication? No  Has the patient been seen for an appointment in the last year OR does the patient have an upcoming appointment? Yes  Can we respond through MyChart? No  Agent: Please be advised that Rx refills may take up to 3 business days. We ask that you follow-up with your pharmacy.

## 2023-08-10 NOTE — Patient Instructions (Signed)
 Refills sent for your asthma meds  Order- DME [facility]- continue CPAP auto 10-20, AirView. Please renew mask of choice, supplies, humidifier

## 2023-08-10 NOTE — Assessment & Plan Note (Signed)
 Benefits from CPAP with good  compliance and control Plan- continue auto 10-20, renew supplies, continue AirView

## 2023-08-14 ENCOUNTER — Other Ambulatory Visit (HOSPITAL_COMMUNITY): Payer: Self-pay

## 2023-08-17 ENCOUNTER — Telehealth: Payer: Self-pay

## 2023-08-17 MED ORDER — VALACYCLOVIR HCL 500 MG PO TABS: ORAL_TABLET | ORAL | 3 refills | Status: DC

## 2023-08-17 NOTE — Telephone Encounter (Signed)
 Patient was identified as falling into the [place] Measure - Diabetes.   Patient was: Referred to pharmacy for chronic disease management.  Patient follows with pharmacy for diabetes management

## 2023-08-18 DIAGNOSIS — E1165 Type 2 diabetes mellitus with hyperglycemia: Secondary | ICD-10-CM | POA: Diagnosis not present

## 2023-08-22 ENCOUNTER — Telehealth: Payer: Self-pay

## 2023-08-22 ENCOUNTER — Telehealth: Payer: Self-pay | Admitting: Internal Medicine

## 2023-08-22 ENCOUNTER — Other Ambulatory Visit: Payer: PPO

## 2023-08-22 NOTE — Telephone Encounter (Signed)
 Pharmacy Patient Advocate Encounter   Received notification from CoverMyMeds that prior authorization for [facility] is required/requested.   Insurance verification completed.   The patient is insured through [facility] .   Per test claim: PA required; PA submitted to above mentioned insurance via CoverMyMeds Key/confirmation #/EOC [id number] Status is pending

## 2023-08-22 NOTE — Telephone Encounter (Signed)
 Patient called and said that in order to get her supplies she needs [place] to send last OV notes to [facility] Phone: [phone]

## 2023-08-23 NOTE — Telephone Encounter (Signed)
 Chart notes have been faxed to [facility]

## 2023-08-24 ENCOUNTER — Other Ambulatory Visit: Payer: Self-pay | Admitting: Internal Medicine

## 2023-08-24 DIAGNOSIS — E785 Hyperlipidemia, unspecified: Secondary | ICD-10-CM

## 2023-08-29 NOTE — Telephone Encounter (Signed)
 Pharmacy Patient Advocate Encounter  Received notification from [facility] that Prior Authorization for [name] has been APPROVED through 06/12/2024   PA #/Case ID/Reference #: [id number]

## 2023-08-31 NOTE — Patient Instructions (Addendum)
 DUE TO COVID-19 ONLY TWO VISITORS  (aged 63 and older)  ARE ALLOWED TO COME WITH YOU AND STAY IN THE WAITING ROOM ONLY DURING PRE OP AND PROCEDURE.   **NO VISITORS ARE ALLOWED IN THE SHORT STAY AREA OR RECOVERY ROOM!!**  IF YOU WILL BE ADMITTED INTO THE HOSPITAL YOU ARE ALLOWED ONLY FOUR SUPPORT PEOPLE DURING VISITATION HOURS ONLY (7 AM -8PM)   The support person(s) must pass our screening, gel in and out, and wear a mask at all times, including in the patient's room. Patients must also wear a mask when staff or their support person are in the room. Visitors GUEST BADGE MUST BE WORN VISIBLY  One adult visitor may remain with you overnight and MUST be in the room by 8 P.M.     Your procedure is scheduled on: 09/07/23   Report to [facility] Main Entrance    Report to admitting at: 11:15 AM   Call this number if you have problems the morning of surgery [phone]   Do not eat food :After Midnight.   After Midnight you may have the following liquids until : 10:30 AM DAY OF SURGERY  Water Black Coffee (sugar ok, NO MILK/CREAM OR CREAMERS)  Tea (sugar ok, NO MILK/CREAM OR CREAMERS) regular and decaf                             Plain Jell-O (NO RED)                                           Fruit ices (not with fruit pulp, NO RED)                                     Popsicles (NO RED)                                                                  Juice: apple, WHITE grape, WHITE cranberry Sports drinks like Gatorade (NO RED)              FOLLOW ANY ADDITIONAL PRE OP INSTRUCTIONS YOU RECEIVED FROM YOUR SURGEON'S OFFICE!!!   Oral Hygiene is also important to reduce your risk of infection.                                    Remember - BRUSH YOUR TEETH THE MORNING OF SURGERY WITH YOUR REGULAR TOOTHPASTE  DENTURES WILL BE REMOVED PRIOR TO SURGERY PLEASE DO NOT APPLY "Poly grip" OR ADHESIVES!!!   Do NOT smoke after Midnight   Take these medicines the morning of surgery with A  SIP OF WATER: pregabalin,letrozole,dicyclomine,esomeprazole.Famotidine as needed,use inhalers as usual and bring them the day of surgery.  How to Manage Your Diabetes Before and After Surgery  Why is it important to control my blood sugar before and after surgery? Improving blood sugar levels before and after surgery helps healing and can limit problems. A way of improving blood sugar  control is eating a healthy diet by:  Eating less sugar and carbohydrates  Increasing activity/exercise  Talking with your doctor about reaching your blood sugar goals High blood sugars (greater than 180 mg/dL) can raise your risk of infections and slow your recovery, so you will need to focus on controlling your diabetes during the weeks before surgery. Make sure that the doctor who takes care of your diabetes knows about your planned surgery including the date and location.  How do I manage my blood sugar before surgery? Check your blood sugar at least 4 times a day, starting 2 days before surgery, to make sure that the level is not too high or low. Check your blood sugar the morning of your surgery when you wake up and every 2 hours until you get to the Short Stay unit. If your blood sugar is less than 70 mg/dL, you will need to treat for low blood sugar: Do not take insulin. Treat a low blood sugar (less than 70 mg/dL) with  cup of clear juice (cranberry or apple), 4 glucose tablets, OR glucose gel. Recheck blood sugar in 15 minutes after treatment (to make sure it is greater than 70 mg/dL). If your blood sugar is not greater than 70 mg/dL on recheck, call [phone] for further instructions. Report your blood sugar to the short stay nurse when you get to Short Stay.  If you are admitted to the hospital after surgery: Your blood sugar will be checked by the staff and you will probably be given insulin after surgery (instead of oral diabetes medicines) to make sure you have good blood sugar levels. The  goal for blood sugar control after surgery is 80-180 mg/dL.   WHAT DO I DO ABOUT MY DIABETES MEDICATION?  HOLD farxiga after: 09/03/23  THE NIGHT BEFORE SURGERY, DO NOT take novolog insulin dose.     THE MORNING OF SURGERY, DO NOT TAKE ANY ORAL DIABETIC MEDICATIONS DAY OF YOUR SURGERY  DO NOT TAKE THE FOLLOWING 7 DAYS PRIOR TO SURGERY: Ozempic, Wegovy, Rybelsus (Semaglutide), Byetta (exenatide), Bydureon (exenatide ER), Victoza, Saxenda (liraglutide), or Trulicity (dulaglutide) Mounjaro (Tirzepatide) Adlyxin (Lixisenatide), Polyethylene Glycol Loxenatide. HOLD mounjaro after: 08/31/23  If your CBG is greater than 220 mg/dL, you may take  of your sliding scale  (correction) dose of novolog insulin.                   You may not have any metal on your body including hair pins, jewelry, and body piercing             Do not wear make-up, lotions, powders, perfumes/cologne, or deodorant  Do not wear nail polish including gel and S&S, artificial/acrylic nails, or any other type of covering on natural nails including finger and toenails. If you have artificial nails, gel coating, etc. that needs to be removed by a nail salon please have this removed prior to surgery or surgery may need to be canceled/ delayed if the surgeon/ anesthesia feels like they are unable to be safely monitored.   Do not shave  48 hours prior to surgery.    Do not bring valuables to the hospital. [facility] IS NOT             RESPONSIBLE   FOR VALUABLES.   Contacts, glasses, or bridgework may not be worn into surgery.   Bring small overnight bag day of surgery.   DO NOT BRING YOUR HOME MEDICATIONS TO THE HOSPITAL. PHARMACY WILL DISPENSE MEDICATIONS LISTED  ON YOUR MEDICATION LIST TO YOU DURING YOUR ADMISSION IN THE HOSPITAL!    Patients discharged on the day of surgery will not be allowed to drive home.  Someone NEEDS to stay with you for the first 24 hours after anesthesia.   Special Instructions: Bring a copy of  your healthcare power of attorney and living will documents         the day of surgery if you haven't scanned them before.              Please read over the following fact sheets you were given: IF YOU HAVE QUESTIONS ABOUT YOUR PRE-OP INSTRUCTIONS PLEASE CALL [phone]    [facility] - Preparing for Surgery Before surgery, you can play an important role.  Because skin is not sterile, your skin needs to be as free of germs as possible.  You can reduce the number of germs on your skin by washing with CHG (chlorahexidine gluconate) soap before surgery.  CHG is an antiseptic cleaner which kills germs and bonds with the skin to continue killing germs even after washing. Please DO NOT use if you have an allergy to CHG or antibacterial soaps.  If your skin becomes reddened/irritated stop using the CHG and inform your nurse when you arrive at Short Stay. Do not shave (including legs and underarms) for at least 48 hours prior to the first CHG shower.  You may shave your face/neck. Please follow these instructions carefully:  1.  Shower with CHG Soap the night before surgery and the  morning of Surgery.  2.  If you choose to wash your hair, wash your hair first as usual with your  normal  shampoo.  3.  After you shampoo, rinse your hair and body thoroughly to remove the  shampoo.                           4.  Use CHG as you would any other liquid soap.  You can apply chg directly  to the skin and wash                       Gently with a scrungie or clean washcloth.  5.  Apply the CHG Soap to your body ONLY FROM THE NECK DOWN.   Do not use on face/ open                           Wound or open sores. Avoid contact with eyes, ears mouth and genitals (private parts).                       Wash face,  Genitals (private parts) with your normal soap.             6.  Wash thoroughly, paying special attention to the area where your surgery  will be performed.  7.  Thoroughly rinse your body with warm water from the  neck down.  8.  DO NOT shower/wash with your normal soap after using and rinsing off  the CHG Soap.                9.  Pat yourself dry with a clean towel.            10.  Wear clean pajamas.            11.  Place clean sheets on  your bed the night of your first shower and do not  sleep with pets. Day of Surgery : Do not apply any lotions/deodorants the morning of surgery.  Please wear clean clothes to the hospital/surgery center.  FAILURE TO FOLLOW THESE INSTRUCTIONS MAY RESULT IN THE CANCELLATION OF YOUR SURGERY PATIENT SIGNATURE_________________________________  NURSE SIGNATURE__________________________________  ________________________________________________________________________

## 2023-08-31 NOTE — Progress Notes (Signed)
Pt. Needs orders for surgery. 

## 2023-09-01 ENCOUNTER — Encounter (HOSPITAL_COMMUNITY): Payer: Self-pay

## 2023-09-01 ENCOUNTER — Other Ambulatory Visit: Payer: Self-pay | Admitting: Radiology

## 2023-09-01 ENCOUNTER — Telehealth: Payer: Self-pay | Admitting: Pharmacy Technician

## 2023-09-01 ENCOUNTER — Other Ambulatory Visit: Payer: Self-pay

## 2023-09-01 ENCOUNTER — Telehealth: Payer: Self-pay | Admitting: Emergency Medicine

## 2023-09-01 ENCOUNTER — Encounter (HOSPITAL_COMMUNITY)
Admission: RE | Admit: 2023-09-01 | Discharge: 2023-09-01 | Disposition: A | Discharge status: Home / Self Care | Payer: PPO | Source: Ambulatory Visit | Attending: Urology | Admitting: Urology

## 2023-09-01 ENCOUNTER — Encounter: Payer: Self-pay | Admitting: Radiology

## 2023-09-01 ENCOUNTER — Ambulatory Visit
Admission: RE | Admit: 2023-09-01 | Discharge: 2023-09-01 | Disposition: A | Discharge status: Home / Self Care | Payer: PPO | Source: Ambulatory Visit | Attending: Family Medicine | Admitting: Family Medicine

## 2023-09-01 VITALS — BP 148/77 | HR 77 | Temp 98.5°F | Ht 64.0 in | Wt 252.0 lb

## 2023-09-01 DIAGNOSIS — E1159 Type 2 diabetes mellitus with other circulatory complications: Secondary | ICD-10-CM | POA: Insufficient documentation

## 2023-09-01 DIAGNOSIS — N281 Cyst of kidney, acquired: Secondary | ICD-10-CM | POA: Diagnosis not present

## 2023-09-01 DIAGNOSIS — I152 Hypertension secondary to endocrine disorders: Secondary | ICD-10-CM | POA: Diagnosis not present

## 2023-09-01 DIAGNOSIS — I700 Atherosclerosis of aorta: Secondary | ICD-10-CM | POA: Diagnosis not present

## 2023-09-01 DIAGNOSIS — Z01818 Encounter for other preprocedural examination: Secondary | ICD-10-CM | POA: Insufficient documentation

## 2023-09-01 DIAGNOSIS — Z794 Long term (current) use of insulin: Secondary | ICD-10-CM | POA: Diagnosis not present

## 2023-09-01 DIAGNOSIS — Z01812 Encounter for preprocedural laboratory examination: Secondary | ICD-10-CM | POA: Diagnosis present

## 2023-09-01 DIAGNOSIS — Z01810 Encounter for preprocedural cardiovascular examination: Secondary | ICD-10-CM | POA: Diagnosis present

## 2023-09-01 DIAGNOSIS — N952 Postmenopausal atrophic vaginitis: Secondary | ICD-10-CM

## 2023-09-01 DIAGNOSIS — E1165 Type 2 diabetes mellitus with hyperglycemia: Secondary | ICD-10-CM | POA: Diagnosis not present

## 2023-09-01 DIAGNOSIS — R109 Unspecified abdominal pain: Secondary | ICD-10-CM

## 2023-09-01 DIAGNOSIS — R9431 Abnormal electrocardiogram [ECG] [EKG]: Secondary | ICD-10-CM | POA: Insufficient documentation

## 2023-09-01 DIAGNOSIS — A6004 Herpesviral vulvovaginitis: Secondary | ICD-10-CM

## 2023-09-01 DIAGNOSIS — K760 Fatty (change of) liver, not elsewhere classified: Secondary | ICD-10-CM | POA: Diagnosis not present

## 2023-09-01 HISTORY — DX: Dyspnea, unspecified: R06.00

## 2023-09-01 LAB — CBC
HCT: 45.6 % (ref 36.0–46.0)
Hemoglobin: 14 g/dL (ref 12.0–15.0)
MCH: 25.5 pg — ABNORMAL LOW (ref 26.0–34.0)
MCHC: 30.7 g/dL (ref 30.0–36.0)
MCV: 83.2 fL (ref 80.0–100.0)
Platelets: 219 10*3/uL (ref 150–400)
RBC: 5.48 MIL/uL — ABNORMAL HIGH (ref 3.87–5.11)
RDW: 16.1 % — ABNORMAL HIGH (ref 11.5–15.5)
WBC: 5.4 10*3/uL (ref 4.0–10.5)
nRBC: 0 % (ref 0.0–0.2)

## 2023-09-01 LAB — BASIC METABOLIC PANEL
Anion gap: 10 (ref 5–15)
BUN: 10 mg/dL (ref 8–23)
CO2: 24 mmol/L (ref 22–32)
Calcium: 9.6 mg/dL (ref 8.9–10.3)
Chloride: 105 mmol/L (ref 98–111)
Creatinine, Ser: 0.6 mg/dL (ref 0.44–1.00)
GFR, Estimated: >60 mL/min (ref >60)
Glucose, Bld: 139 mg/dL — ABNORMAL HIGH (ref 70–99)
Potassium: 3.6 mmol/L (ref 3.5–5.1)
Sodium: 139 mmol/L (ref 135–145)

## 2023-09-01 LAB — HEMOGLOBIN A1C
Hgb A1c MFr Bld: 8.2 % — ABNORMAL HIGH (ref 4.8–5.6)
Mean Plasma Glucose: 188.64 mg/dL

## 2023-09-01 LAB — GLUCOSE, CAPILLARY: Glucose-Capillary: 148 mg/dL — ABNORMAL HIGH (ref 70–99)

## 2023-09-01 MED ORDER — VALACYCLOVIR HCL 500 MG PO TABS: ORAL_TABLET | ORAL | 3 refills | Status: AC

## 2023-09-01 MED ORDER — ESTRADIOL 0.1 MG/GM VA CREA: 1.0000 | TOPICAL_CREAM | Freq: Every day | VAGINAL | 0 refills | Status: AC

## 2023-09-01 NOTE — Progress Notes (Signed)
 For Anesthesia: PCP - Georgina Quint, MD  Cardiologist -   Bowel Prep reminder:  Chest x-ray -  EKG - 09/01/23 Stress Test -  ECHO - 07/10/20 Cardiac Cath -  Pacemaker/ICD device last checked: Pacemaker orders received: Device Rep notified:  Spinal Cord Stimulator:  Sleep Study - Yes CPAP - Yes  Fasting Blood Sugar - 100's Checks Blood Sugar: continuous dexcom monitor Date and result of last Hgb A1c-10.2: 06/09/23  Last dose of GLP1 agonist- 08/27/23 GLP1 instructions: To keep it on hold  Last dose of SGLT-2 inhibitors- Farxiga SGLT-2 instructions: To hold it after: 09/03/23  Blood Thinner Instructions:N/A Aspirin Instructions: Last Dose: INSULIN PUMP.: Pt. Was advised to consult with endocrinologist about Diabetes instructions. Activity level: Can go up a flight of stairs and activities of daily living without stopping and without chest pain and/or shortness of breath      Unable to go up a flight of stairs shortness of breath    Anesthesia review: Hx: COPD,DIA,HTN,OSA(CPAP),Insulin pump.  Patient denies shortness of breath, fever, cough and chest pain at PAT appointment   Patient verbalized understanding of instructions that were given to them at the PAT appointment. Patient was also instructed that they will need to review over the PAT instructions again at home before surgery.

## 2023-09-01 NOTE — Telephone Encounter (Signed)
 Copied from CRM [id number]. Topic: Clinical - Prescription Issue >> Sep 01, 2023 10:36 AM Adele Barthel wrote: Reason for CRM:   Calling: Sandie Ano CB# [phone]  Requesting patient's valACYclovir (VALTREX) 500 MG tablet and estradiol (ESTRACE) 0.1 MG/GM vaginal cream be transferred to [facility] pharmacy as soon as possible. Her previous request on 03/06 was sent to the [facility] pharmacy on file.   Patient is no longer using [facility] for these medications.

## 2023-09-01 NOTE — Progress Notes (Signed)
   09/01/2023 Name: [name] MRN: [id number] DOB: [birth year]  Patient is appearing on a report for [place] Metric Diabetes and last engaged with the clinical pharmacist to discuss diabetes on 07/19/2023.   Contacted patient today to discuss diabetes management and completed medication review.   Diabetes Plan from last clinical pharmacist appointment:  - Currently uncontrolled, Goal A1c <7% - Recommend to continue follow up with endo - Hopefully can titrate up Mounjaro to help with BG control      Medication Adherence Barriers Identified:  Patient made recommended medication changes per plan: Yes Patient informs she is using Mounjaro 5mg  sq weekly and is tolerating with no side effects noted. She informs she also uses an insulin pump for Novolog and is taking [place] 10mg  po every day and Metofrmin 1000mg  po BID. Patient reports no affordability concerns at this time. Access issues with any new medication or testing device: No   Patient is checking blood sugars as prescribed: Yes Patient informs she uses the DexCom G7 CGM for blood sugars. She informs the blood sugars have not been ranging over 150 lately. She reports no lows.   Medication Adherence Barriers Addressed/Actions Taken:  Reviewed medication changes per plan from last clinical pharmacist note Reviewed instructions for monitoring blood sugars at home and reminded patient to keep a written log to review with pharmacist Reminded patient of date/time of upcoming clinical pharmacist follow up and any upcoming PCP/specialists visits. Patient denies transportation barriers to the appointment. Yes. Patient informs she is able to make it to her appointments with no problems noted.  Next Endocrinology appointment is scheduled for: 10/17/23  Next PCP appointment is scheduled for: 11/14/23  Pattricia Boss, CPhT [place]  Office: [phone] Fax: [phone] Email: [email or web address]

## 2023-09-04 ENCOUNTER — Other Ambulatory Visit: Payer: Self-pay | Admitting: Hematology and Oncology

## 2023-09-05 ENCOUNTER — Telehealth: Payer: Self-pay | Admitting: Hematology and Oncology

## 2023-09-05 NOTE — Telephone Encounter (Signed)
 Scheduled appointments per 3/24 scheduling message. Left the patient a voicemail with the appointment details and she will be mailed an appointment reminder.

## 2023-09-05 NOTE — H&P (Signed)
 63 year old female who initially presented to her PCP with genitourinary discomfort with burning sensation during urination and vaginal sores. Disorder associated with peeling skin patient is also noticed occasional specks of red wiping patient does have a history of genital herpes but is on Valtrex to manage this. Patient has negative chlamydia and gonorrhea workup. Pap smear is negative for malignancy. She is referred here for microscopic hematuria/gross hematuria   PMH: HLD, HTN, DM2, vaginal spotting, hepatic steatosis,. HSV.   Microscopic hematuria:  08/04/2023: patient has had GH, saw it 3 months ago, previous tobacco use 15 pack years, works at [employer]. No exposure to abnormal chemicals, Sister with kidney cancer, breast cancer in sister.  09/07/23: bladder biopsy today        ALLERGIES: lisinopril    MEDICATIONS: Crestor 20 mg tablet  Hydrochlorothiazide 25 mg tablet  Metformin Hcl 1,000 mg tablet  Acyclovir 400 mg tablet  Aspir 81 81 mg tablet, delayed release  Calcium + D  Chlorphen  Gabapentin 300 mg capsule  Januvia  Multiple Vitamin  Novolog  Oxycodone-Acetaminophen 5 mg-325 mg tablet  Pantoprazole Sodium 40 mg tablet, delayed release  Symbicort  Victoza 2-Pak     GU PSH: Hysterectomy - 2008       PSH Notes: Breast Bx. 2017. Back surgery in 2012. Right Hand surg. in 2016. Breast reduction in 1982.   NON-GU PSH: Bilateral Tubal Ligation - 1992 Tonsillectomy - 2010     GU PMH: Renal cyst, Persistent right renal cyst. This does not have complicating features I thickened walls, septae, any solid components. - 2018    NON-GU PMH: Arthritis Asthma Diabetes Type 2 GERD Hypercholesterolemia Hypertension Sleep Apnea    FAMILY HISTORY: Brain Cancer - Mother Death - Mother, Father Heart Attack - Father   SOCIAL HISTORY: Marital Status: Married Preferred Language: English; Ethnicity: Not Hispanic Or Latino; Race: Black or African American Current  Smoking Status: Patient does not smoke anymore. Smoked for 17 years.   Tobacco Use Assessment Completed: Used Tobacco in last 30 days? Does not drink anymore.  Drinks 2 caffeinated drinks per day. 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    REVIEW OF SYSTEMS:    GU Review Female:   Patient denies frequent urination, hard to postpone urination, burning /pain with urination, get up at night to urinate, leakage of urine, stream starts and stops, trouble starting your stream, have to strain to urinate, and being pregnant.  Gastrointestinal (Upper):   Patient denies indigestion/ heartburn, nausea, and vomiting.  Gastrointestinal (Lower):   Patient denies diarrhea and constipation.  Constitutional:   Patient denies fever, night sweats, weight loss, and fatigue.  Skin:   Patient denies skin rash/ lesion and itching.  Eyes:   Patient denies blurred vision and double vision.  Ears/ Nose/ Throat:   Patient denies sore throat and sinus problems.  Hematologic/Lymphatic:   Patient denies swollen glands and easy bruising.  Cardiovascular:   Patient denies leg swelling and chest pains.  Respiratory:   Patient denies cough and shortness of breath.  Endocrine:   Patient denies excessive thirst.  Musculoskeletal:   Patient denies back pain and joint pain.  Neurological:   Patient denies headaches and dizziness.  Psychologic:   Patient denies depression and anxiety.   VITAL SIGNS:      08/04/2023 10:20 AM  BP 191/98 mmHg  Pulse 97 /min  Temperature 97.3 F / 36.2 C   GU PHYSICAL EXAMINATION:    External Genitalia: No hirsuitism, no rash, no scarring,  no cyst, no erythematous lesion, no papular lesion, no blanched lesion, no warty lesion, no labial adhesions, no atrophic introitus. No edema.   Urethral Meatus: No leakage normal size  Bladder: No cystocele present  Vagina: Mild atrophy, no excoriations or lesions as noted previously from OSH, no excoriation or bleeding noted.   MULTI-SYSTEM PHYSICAL  EXAMINATION:    Constitutional: Well-nourished. No physical deformities. Normally developed. Good grooming.  Respiratory: No labored breathing, no use of accessory muscles.   Cardiovascular: Normal temperature, normal extremity pulses, no swelling, no varicosities.  Skin: No paleness, no jaundice, no cyanosis. No lesion, no ulcer, no rash.  Musculoskeletal: Normal gait and station of head and neck.     Complexity of Data:  Source Of History:  Patient  Records Review:   Previous Patient Records  Urine Test Review:   Urinalysis   PROCEDURES:         Flexible Cystoscopy - 52000  Risks, benefits, and some of the potential complications of the procedure were discussed at length with the patient including infection, bleeding, voiding discomfort, urinary retention, fever, chills, sepsis, and others. All questions were answered. Informed consent was obtained. Antibiotic prophylaxis was given. Sterile technique and intraurethral analgesia were used.  Meatus:  Normal size. Normal location. Normal condition.  Urethra:  No hypermobility. No leakage.  Ureteral Orifices:  Normal location. Normal size. Normal shape. Effluxed clear urine.  Bladder:  Irregular collection of blood vessels on the posterior bladder wall about 3 mm in diameter, likely source of bleeding slightly papillary we will plan to perform biopsy of this area. No other tumors stones or trabeculations noted within the bladder.      The lower urinary tract was carefully examined. The procedure was well-tolerated and without complications. Antibiotic instructions were given. Instructions were given to call the office immediately for bloody urine, difficulty urinating, urinary retention, painful or frequent urination, fever, chills, nausea, vomiting or other illness. The patient stated that she understood these instructions and would comply with them.         Pelvic Examination - [id number] Pelvic examination is performed in the presence of a  chaperone.          Visit Complexity - G2211          Urinalysis w/Scope Dipstick Dipstick Cont'd Micro  Color: Yellow Bilirubin: Neg mg/dL WBC/hpf: NS (Not Seen)  Appearance: Slightly Cloudy Ketones: Neg mg/dL RBC/hpf: NS (Not Seen)  Specific Gravity: 1.015 Blood: Neg ery/uL Bacteria: Few (10-25/hpf)  pH: 5.5 Protein: Neg mg/dL Cystals: NS (Not Seen)  Glucose: 3+ mg/dL Urobilinogen: 0.2 mg/dL Casts: NS (Not Seen)    Nitrites: Neg Trichomonas: Not Present    Leukocyte Esterase: Neg leu/uL Mucous: Not Present      Epithelial Cells: 0 - 5/hpf      Yeast: NS (Not Seen)      Sperm: Not Present    ASSESSMENT:      ICD-10 Details  1 GU:   Other Disorders Of Bladder - N32.89 Undiagnosed New Problem  2   Gross hematuria - R31.0 Undiagnosed New Problem   PLAN:           Orders Labs Urine Culture, BUN/Creatinine          Schedule X-Rays: 2 Weeks - C.T. Hematuria With and Without I.V. Contrast          Document Letter(s):  Created for Patient: Clinical Summary         Notes:   Gross hematuria: Irregular collection of  blood vessels on the posterior bladder wall feeling the best option for this is to perform biopsy. Will not pursue chemotherapy after this biopsy. Urine culture ordered today as well as CT urogram.   We discussed risk benefits alternatives to transurethral resection of bladder tumor. Benefits are diagnostic and therapeutic which include removal of bladder tumor and relieve the possible irritative symptoms from bladder tumor. We discussed risk including not being able to remove all of the tumor, possibility of perforation of the bladder requiring long-term catheter or operative intervention to repair. The need for catheter postoperatively was also discussed, as well as postoperative lower urinary tract symptoms in the immediate postop period. Patient voiced their understanding and would like to proceed with the surgery   Plan for bladder biopsy today.

## 2023-09-07 ENCOUNTER — Encounter (HOSPITAL_COMMUNITY): Payer: Self-pay | Admitting: Urology

## 2023-09-07 ENCOUNTER — Encounter (HOSPITAL_COMMUNITY)
Admission: RE | Disposition: A | Discharge status: Home / Self Care | Payer: Self-pay | Source: Ambulatory Visit | Attending: Urology

## 2023-09-07 ENCOUNTER — Ambulatory Visit (HOSPITAL_COMMUNITY)
Admission: RE | Admit: 2023-09-07 | Discharge: 2023-09-07 | Disposition: A | Discharge status: Home / Self Care | Payer: PPO | Source: Ambulatory Visit | Attending: Urology | Admitting: Urology

## 2023-09-07 ENCOUNTER — Ambulatory Visit (HOSPITAL_BASED_OUTPATIENT_CLINIC_OR_DEPARTMENT_OTHER): Admitting: Anesthesiology

## 2023-09-07 ENCOUNTER — Ambulatory Visit (HOSPITAL_COMMUNITY)

## 2023-09-07 ENCOUNTER — Ambulatory Visit (HOSPITAL_COMMUNITY): Admitting: Anesthesiology

## 2023-09-07 DIAGNOSIS — G4730 Sleep apnea, unspecified: Secondary | ICD-10-CM | POA: Diagnosis not present

## 2023-09-07 DIAGNOSIS — G8929 Other chronic pain: Secondary | ICD-10-CM | POA: Insufficient documentation

## 2023-09-07 DIAGNOSIS — Z79899 Other long term (current) drug therapy: Secondary | ICD-10-CM | POA: Diagnosis not present

## 2023-09-07 DIAGNOSIS — Z8249 Family history of ischemic heart disease and other diseases of the circulatory system: Secondary | ICD-10-CM | POA: Diagnosis not present

## 2023-09-07 DIAGNOSIS — Z87891 Personal history of nicotine dependence: Secondary | ICD-10-CM

## 2023-09-07 DIAGNOSIS — K219 Gastro-esophageal reflux disease without esophagitis: Secondary | ICD-10-CM | POA: Insufficient documentation

## 2023-09-07 DIAGNOSIS — N3289 Other specified disorders of bladder: Secondary | ICD-10-CM | POA: Diagnosis not present

## 2023-09-07 DIAGNOSIS — J449 Chronic obstructive pulmonary disease, unspecified: Secondary | ICD-10-CM | POA: Diagnosis not present

## 2023-09-07 DIAGNOSIS — R310 Gross hematuria: Secondary | ICD-10-CM | POA: Diagnosis not present

## 2023-09-07 DIAGNOSIS — E1140 Type 2 diabetes mellitus with diabetic neuropathy, unspecified: Secondary | ICD-10-CM | POA: Diagnosis not present

## 2023-09-07 DIAGNOSIS — M961 Postlaminectomy syndrome, not elsewhere classified: Secondary | ICD-10-CM | POA: Diagnosis not present

## 2023-09-07 DIAGNOSIS — E119 Type 2 diabetes mellitus without complications: Secondary | ICD-10-CM | POA: Diagnosis not present

## 2023-09-07 DIAGNOSIS — E785 Hyperlipidemia, unspecified: Secondary | ICD-10-CM | POA: Insufficient documentation

## 2023-09-07 DIAGNOSIS — N329 Bladder disorder, unspecified: Secondary | ICD-10-CM

## 2023-09-07 DIAGNOSIS — M5416 Radiculopathy, lumbar region: Secondary | ICD-10-CM | POA: Insufficient documentation

## 2023-09-07 DIAGNOSIS — Z7984 Long term (current) use of oral hypoglycemic drugs: Secondary | ICD-10-CM | POA: Insufficient documentation

## 2023-09-07 DIAGNOSIS — Z6841 Body Mass Index (BMI) 40.0 and over, adult: Secondary | ICD-10-CM | POA: Diagnosis not present

## 2023-09-07 DIAGNOSIS — E66813 Obesity, class 3: Secondary | ICD-10-CM | POA: Insufficient documentation

## 2023-09-07 DIAGNOSIS — J4489 Other specified chronic obstructive pulmonary disease: Secondary | ICD-10-CM | POA: Insufficient documentation

## 2023-09-07 DIAGNOSIS — M549 Dorsalgia, unspecified: Secondary | ICD-10-CM | POA: Insufficient documentation

## 2023-09-07 DIAGNOSIS — M1990 Unspecified osteoarthritis, unspecified site: Secondary | ICD-10-CM | POA: Insufficient documentation

## 2023-09-07 DIAGNOSIS — K760 Fatty (change of) liver, not elsewhere classified: Secondary | ICD-10-CM | POA: Diagnosis not present

## 2023-09-07 DIAGNOSIS — I10 Essential (primary) hypertension: Secondary | ICD-10-CM

## 2023-09-07 DIAGNOSIS — M5430 Sciatica, unspecified side: Secondary | ICD-10-CM | POA: Diagnosis not present

## 2023-09-07 DIAGNOSIS — E1165 Type 2 diabetes mellitus with hyperglycemia: Secondary | ICD-10-CM | POA: Diagnosis not present

## 2023-09-07 DIAGNOSIS — Z794 Long term (current) use of insulin: Secondary | ICD-10-CM | POA: Diagnosis not present

## 2023-09-07 HISTORY — PX: CYSTOSCOPY WITH BIOPSY: SHX5122

## 2023-09-07 LAB — GLUCOSE, CAPILLARY
Glucose-Capillary: 163 mg/dL — ABNORMAL HIGH (ref 70–99)
Glucose-Capillary: 114 mg/dL — ABNORMAL HIGH (ref 70–99)
Glucose-Capillary: 112 mg/dL — ABNORMAL HIGH (ref 70–99)

## 2023-09-07 LAB — TYPE AND SCREEN
ABO/RH(D): O POS
Antibody Screen: NEGATIVE

## 2023-09-07 SURGERY — CYSTOSCOPY, WITH BIOPSY
Anesthesia: General

## 2023-09-07 MED ORDER — LIDOCAINE 2% (20 MG/ML) 5 ML SYRINGE
INTRAMUSCULAR | Status: DC | PRN
  Administered 2023-09-07: 100 mg via INTRAVENOUS

## 2023-09-07 MED ORDER — LACTATED RINGERS IV SOLN: INTRAVENOUS | Status: DC | PRN

## 2023-09-07 MED ORDER — MIDAZOLAM HCL 2 MG/2ML IJ SOLN
INTRAMUSCULAR | Status: AC
  Filled 2023-09-07: qty 2

## 2023-09-07 MED ORDER — IOHEXOL 300 MG/ML  SOLN
INTRAMUSCULAR | Status: DC | PRN
  Administered 2023-09-07: 25 mL

## 2023-09-07 MED ORDER — STERILE WATER FOR IRRIGATION IR SOLN
Status: DC | PRN
  Administered 2023-09-07: 3000 mL

## 2023-09-07 MED ORDER — DEXAMETHASONE SODIUM PHOSPHATE 10 MG/ML IJ SOLN
INTRAMUSCULAR | Status: AC
  Filled 2023-09-07: qty 1

## 2023-09-07 MED ORDER — PROPOFOL 10 MG/ML IV BOLUS
INTRAVENOUS | Status: AC
  Filled 2023-09-07: qty 20

## 2023-09-07 MED ORDER — ONDANSETRON HCL 4 MG/2ML IJ SOLN
INTRAMUSCULAR | Status: AC
  Filled 2023-09-07: qty 2

## 2023-09-07 MED ORDER — MIDAZOLAM HCL 2 MG/2ML IJ SOLN
INTRAMUSCULAR | Status: DC | PRN
  Administered 2023-09-07: 2 mg via INTRAVENOUS

## 2023-09-07 MED ORDER — LIDOCAINE HCL (PF) 2 % IJ SOLN
INTRAMUSCULAR | Status: AC
  Filled 2023-09-07: qty 5

## 2023-09-07 MED ORDER — SUCCINYLCHOLINE CHLORIDE 200 MG/10ML IV SOSY
PREFILLED_SYRINGE | INTRAVENOUS | Status: DC | PRN
  Administered 2023-09-07: 120 mg via INTRAVENOUS

## 2023-09-07 MED ORDER — ONDANSETRON HCL 4 MG/2ML IJ SOLN
INTRAMUSCULAR | Status: DC | PRN
  Administered 2023-09-07: 4 mg via INTRAVENOUS

## 2023-09-07 MED ORDER — CHLORHEXIDINE GLUCONATE CLOTH 2 % EX PADS: 6.0000 | MEDICATED_PAD | Freq: Once | CUTANEOUS | Status: DC

## 2023-09-07 MED ORDER — ORAL CARE MOUTH RINSE: 15.0000 mL | Freq: Once | OROMUCOSAL | Status: AC

## 2023-09-07 MED ORDER — PROPOFOL 10 MG/ML IV BOLUS
INTRAVENOUS | Status: DC | PRN
  Administered 2023-09-07: 150 mg via INTRAVENOUS

## 2023-09-07 MED ORDER — FENTANYL CITRATE (PF) 100 MCG/2ML IJ SOLN
INTRAMUSCULAR | Status: AC
  Filled 2023-09-07: qty 2

## 2023-09-07 MED ORDER — METHOCARBAMOL 750 MG PO TABS: 750.0000 mg | ORAL_TABLET | Freq: Four times a day (QID) | ORAL | 0 refills | Status: AC

## 2023-09-07 MED ORDER — ROCURONIUM BROMIDE 10 MG/ML (PF) SYRINGE
PREFILLED_SYRINGE | INTRAVENOUS | Status: AC
  Filled 2023-09-07: qty 10

## 2023-09-07 MED ORDER — KETAMINE HCL 50 MG/5ML IJ SOSY
PREFILLED_SYRINGE | INTRAMUSCULAR | Status: AC
  Filled 2023-09-07: qty 5

## 2023-09-07 MED ORDER — CHLORHEXIDINE GLUCONATE 0.12 % MT SOLN
15.0000 mL | Freq: Once | OROMUCOSAL | Status: AC
  Administered 2023-09-07: 15 mL via OROMUCOSAL

## 2023-09-07 MED ORDER — LACTATED RINGERS IV SOLN: INTRAVENOUS | Status: DC

## 2023-09-07 MED ORDER — PHENAZOPYRIDINE HCL 200 MG PO TABS: 200.0000 mg | ORAL_TABLET | Freq: Three times a day (TID) | ORAL | 0 refills | Status: AC | PRN

## 2023-09-07 MED ORDER — FENTANYL CITRATE (PF) 250 MCG/5ML IJ SOLN
INTRAMUSCULAR | Status: DC | PRN
  Administered 2023-09-07: 100 ug via INTRAVENOUS

## 2023-09-07 MED ORDER — OXYCODONE HCL 5 MG PO TABS: 5.0000 mg | ORAL_TABLET | Freq: Once | ORAL | Status: DC | PRN

## 2023-09-07 MED ORDER — CEFAZOLIN SODIUM-DEXTROSE 2-4 GM/100ML-% IV SOLN
2.0000 g | INTRAVENOUS | Status: AC
  Administered 2023-09-07: 2 g via INTRAVENOUS
  Filled 2023-09-07: qty 100

## 2023-09-07 MED ORDER — INSULIN ASPART 100 UNIT/ML IJ SOLN: 0.0000 [IU] | INTRAMUSCULAR | Status: DC | PRN

## 2023-09-07 MED ORDER — OXYCODONE HCL 5 MG/5ML PO SOLN: 5.0000 mg | Freq: Once | ORAL | Status: DC | PRN

## 2023-09-07 MED ORDER — FENTANYL CITRATE PF 50 MCG/ML IJ SOSY: 25.0000 ug | PREFILLED_SYRINGE | INTRAMUSCULAR | Status: DC | PRN

## 2023-09-07 MED ORDER — ACETAMINOPHEN 10 MG/ML IV SOLN: 1000.0000 mg | Freq: Once | INTRAVENOUS | Status: DC | PRN

## 2023-09-07 MED ORDER — HYOSCYAMINE SULFATE 0.125 MG PO TBDP: 0.1250 mg | ORAL_TABLET | Freq: Four times a day (QID) | ORAL | 0 refills | Status: AC | PRN

## 2023-09-07 MED ORDER — DROPERIDOL 2.5 MG/ML IJ SOLN: 0.6250 mg | Freq: Once | INTRAMUSCULAR | Status: DC | PRN

## 2023-09-07 MED ORDER — ACETAMINOPHEN 500 MG PO TABS
1000.0000 mg | ORAL_TABLET | Freq: Once | ORAL | Status: AC
  Administered 2023-09-07: 1000 mg via ORAL
  Filled 2023-09-07: qty 2

## 2023-09-07 MED ORDER — PHENYLEPHRINE 80 MCG/ML (10ML) SYRINGE FOR IV PUSH (FOR BLOOD PRESSURE SUPPORT)
PREFILLED_SYRINGE | INTRAVENOUS | Status: AC
  Filled 2023-09-07: qty 10

## 2023-09-07 SURGICAL SUPPLY — 14 items
BAG URO CATCHER STRL LF (MISCELLANEOUS) ×1 IMPLANT
DRAPE FOOT SWITCH (DRAPES) ×1 IMPLANT
ELECT REM PT RETURN 15FT ADLT (MISCELLANEOUS) ×1 IMPLANT
GLOVE SURG LX STRL 8.0 MICRO (GLOVE) ×1 IMPLANT
GOWN STRL REUS W/ TWL XL LVL3 (GOWN DISPOSABLE) ×1 IMPLANT
KIT TURNOVER KIT A (KITS) IMPLANT
LOOP CUT BIPOLAR 24F LRG (ELECTROSURGICAL) IMPLANT
MANIFOLD NEPTUNE II (INSTRUMENTS) ×1 IMPLANT
NDL SAFETY ECLIPSE 18X1.5 (NEEDLE) ×1 IMPLANT
PACK CYSTO (CUSTOM PROCEDURE TRAY) ×1 IMPLANT
SYR TOOMEY IRRIG 70ML (MISCELLANEOUS) IMPLANT
SYRINGE TOOMEY IRRIG 70ML (MISCELLANEOUS) IMPLANT
TUBING CONNECTING 10 (TUBING) ×1 IMPLANT
TUBING UROLOGY SET (TUBING) ×1 IMPLANT

## 2023-09-07 NOTE — Transfer of Care (Signed)
 Immediate Anesthesia Transfer of Care Note  Patient: [name]  Procedure(s) Performed: CYSTOSCOPY WITH BIOPSY AND BILATERAL RETROGRADE PYELOGRAM  Patient Location: PACU  Anesthesia Type:General  Level of Consciousness: awake, alert , and oriented  Airway & Oxygen Therapy: Patient Spontanous Breathing and Patient connected to face mask oxygen  Post-op Assessment: Report given to RN and Post -op Vital signs reviewed and stable  Post vital signs: Reviewed and stable  Last Vitals:  Vitals Value Taken Time  BP    Temp    Pulse 69 09/07/23 1545  Resp 17 09/07/23 1545  SpO2 97 % 09/07/23 1545  Vitals shown include unfiled device data.  Last Pain:  Vitals:   09/07/23 1217  TempSrc: Oral  PainSc: 6       Patients Stated Pain Goal: 0 (09/07/23 1217)  Complications: No notable events documented.

## 2023-09-07 NOTE — Discharge Instructions (Signed)
 Transurethral Resection of Bladder Tumor (TURBT) or Bladder Biopsy   Definition:  Transurethral Resection of the Bladder Tumor is a surgical procedure used to diagnose and remove tumors within the bladder. TURBT is the most common treatment for early stage bladder cancer.  General instructions:     Your recent bladder surgery requires very little post hospital care but some definite precautions.  Despite the fact that no skin incisions were used, the area around the bladder incisions are raw and covered with scabs to promote healing and prevent bleeding. Certain precautions are needed to insure that the scabs are not disturbed over the next 2-4 weeks while the healing proceeds.  Because the raw surface inside your bladder and the irritating effects of urine you may expect frequency of urination and/or urgency (a stronger desire to urinate) and perhaps even getting up at night more often. This will usually resolve or improve slowly over the healing period. You may see some blood in your urine over the first 6 weeks. Do not be alarmed, even if the urine was clear for a while. Get off your feet and drink lots of fluids until clearing occurs. If you start to pass clots or don't improve call us.  Catheter: Due to the nature of this surgery some patients need to be discharged with a catheter. If you are discharged with a catheter instructions on how to care for the catheter will be attached to your discharge paperwork. You will be called to schedule an appointment to remove the catheter.   Diet:  You may return to your normal diet immediately. Because of the raw surface of your bladder, alcohol, spicy foods, foods high in acid and drinks with caffeine may cause irritation or frequency and should be used in moderation. To keep your urine flowing freely and avoid constipation, drink plenty of fluids during the day (8-10 glasses). Tip: Avoid cranberry juice because it is very acidic.  Activity:  Do not  lift more than 10 LBS for 2 weeks Do not strain with bowel movents.  Your physical activity doesn't need to be restricted. However, if you are very active, you may see some blood in the urine. We suggest that you reduce your activity under the circumstances until the bleeding has stopped.  Bowels:  It is important to keep your bowels regular during the postoperative period. Straining with bowel movements can cause bleeding. A bowel movement every other day is reasonable. Use a mild laxative if needed, such as milk of magnesia 2-3 tablespoons, or 2 Dulcolax tablets. Call if you continue to have problems. If you had been taking narcotics for pain, before, during or after your surgery, you may be constipated. Take a laxative if necessary.    Medication:  You should resume your pre-surgery medications unless told not to. In addition you may be given an antibiotic to prevent or treat infection. Antibiotics are not always necessary. All medication should be taken as prescribed until the bottles are finished unless you are having an unusual reaction to one of the drugs.

## 2023-09-07 NOTE — Op Note (Signed)
 Operative Note  Preoperative diagnosis:  1.  Erythematous bladder lesion  Postoperative diagnosis: 1.  Same  Procedure(s): 1.  Bladder biopsy with fulguration 2.  Bilateral retrograde pyelogram  Surgeon: Vilma Prader MD  Assistants:  None  Anesthesia:  General  Complications:  None  EBL: Minimal  Specimens: 1.  2 bladder biopsies  Drains/Catheters: 1.  None  Intraoperative findings:   2 erythematous lesions on the posterior bladder wall these with cold cup biopsied.  Fulgurated. Patient hemostatic in the case.  Retrograde pyelogram interpretation: Bilateral trigger pyelograms demonstrating no filling defects and appropriate drainage of contrast.   Indication:  [name] is a 63 y.o. female with gross hematuria and 2 concerning lesions on the posterior bladder wall.  All the risks, benefits were discussed with the patient to include but not limited to infection, pain, bleeding, damage to adjacent structures, need for further operations, adverse reaction to anesthesia and death.  Patient understands these risks and agrees to proceed with the operation as planned.    Description of procedure: After informed consent was obtained from the patient, the patient was taken to the operating room. General anesthesia was administered. The patient was placed in dorsal lithotomy position and prepped and draped in usual sterile fashion. Sequential compression devices were applied to lower extremities at the beginning of the case for DVT prophylaxis. Antibiotics were infused prior to surgery start time. A surgical time-out was performed to properly identify the patient, the surgery to be performed, and the surgical site.     We then passed the 21-French rigid cystoscope down the urethra and into the bladder under direct vision without any difficulty. The anterior urethral was normal. The bladder was inspected with 30 and 70 degree lenses. Once in the bladder, systematic evaluation of  bladder revealed 2 erythematous lesions of the posterior bladder wall as well as a friable bladder with the bladder neck very sensitive to the scope.. The ureteral orfices were in orthotopic position and not involved.   We then biopsied both erythematous spots on the posterior bladder wall that were 3 mm each.  These areas were then fulgurated with the Bugbee.  The bladder was then drained and confirmed that the areas were not bleeding.  Then attention was turned to the left ureter a sensor wire is placed to the left ureter.  The 5 French ureteral catheter was then advanced to the distal aspect of the ureter and a retrograde pyelogram was performed showing no filling defects.  The ureteral catheter was removed and the contrast drained out appropriately.  The same exact procedure was done to the right side.  Plan: Patient will follow-up in 2 weeks for pathology review  Vilma Prader  [facility]

## 2023-09-07 NOTE — Anesthesia Preprocedure Evaluation (Addendum)
 Anesthesia Evaluation  Patient identified by MRN, date of birth, ID band Patient awake    Reviewed: Allergy & Precautions, NPO status , Patient's Chart, lab work & pertinent test results  History of Anesthesia Complications Negative for: history of anesthetic complications  Airway Mallampati: III  TM Distance: >3 FB Neck ROM: Full    Dental   Denies loose teeth.:   Pulmonary neg shortness of breath, asthma , sleep apnea , COPD, neg recent URI, former smoker   Pulmonary exam normal breath sounds clear to auscultation       Cardiovascular hypertension, Pt. on medications (-) angina (-) Past MI  Rhythm:Regular Rate:Normal  HLD   Neuro/Psych Chronic back pain  Neuromuscular disease (neuropathy, postlaminectomy syndrome, sciatica, lumbar radiculopathy)    GI/Hepatic ,GERD  Medicated,,(+)     substance abuse (last use 16 years ago)  cocaine useHepatic steatosis   Endo/Other  diabetes, Poorly Controlled, Type 2  Class 3 obesity  Renal/GU    Bladder mass    Musculoskeletal  (+) Arthritis ,    Abdominal  (+) + obese  Peds  Hematology   Anesthesia Other Findings H/o right breast cancer    Reproductive/Obstetrics                             Anesthesia Physical Anesthesia Plan  ASA: 3  Anesthesia Plan: General   Post-op Pain Management: Tylenol PO (pre-op)*   Induction: Intravenous  PONV Risk Score and Plan: 2 and Ondansetron, Dexamethasone and Treatment may vary due to age or medical condition  Airway Management Planned: Oral ETT  Additional Equipment:   Intra-op Plan:   Post-operative Plan: Extubation in OR  Informed Consent: I have reviewed the patients History and Physical, chart, labs and discussed the procedure including the risks, benefits and alternatives for the proposed anesthesia with the patient or authorized representative who has indicated his/her understanding and  acceptance.     Dental advisory given  Plan Discussed with: CRNA  Anesthesia Plan Comments:         Anesthesia Quick Evaluation

## 2023-09-07 NOTE — Anesthesia Postprocedure Evaluation (Signed)
 Anesthesia Post Note  Patient: [name]  Procedure(s) Performed: CYSTOSCOPY WITH BIOPSY AND BILATERAL RETROGRADE PYELOGRAM     Patient location during evaluation: PACU Anesthesia Type: General Level of consciousness: awake and alert Pain management: pain level controlled Vital Signs Assessment: post-procedure vital signs reviewed and stable Respiratory status: spontaneous breathing, nonlabored ventilation, respiratory function stable and patient connected to nasal cannula oxygen Cardiovascular status: blood pressure returned to baseline and stable Postop Assessment: no apparent nausea or vomiting Anesthetic complications: no   No notable events documented.  Last Vitals:  Vitals:   09/07/23 1645 09/07/23 1654  BP: 130/69 137/69  Pulse: 64 78  Resp: 14 17  Temp:  36.7 C  SpO2: 99% 100%    Last Pain:  Vitals:   09/07/23 1654  TempSrc:   PainSc: 0-No pain                 Augusta Nation

## 2023-09-07 NOTE — Anesthesia Procedure Notes (Signed)
 Procedure Name: Intubation Date/Time: 09/07/2023 3:04 PM  Performed by: Micki Riley, CRNAPre-anesthesia Checklist: Patient identified, Emergency Drugs available, Suction available and Patient being monitored Patient Re-evaluated:Patient Re-evaluated prior to induction Oxygen Delivery Method: Circle System Utilized Preoxygenation: Pre-oxygenation with 100% oxygen Induction Type: IV induction Ventilation: Mask ventilation without difficulty Laryngoscope Size: Miller and 2 Grade View: Grade III Tube type: Oral Tube size: 7.0 mm Number of attempts: 1 Airway Equipment and Method: Oral airway, Patient positioned with wedge pillow and Stylet Placement Confirmation: ETT inserted through vocal cords under direct vision, positive ETCO2 and breath sounds checked- equal and bilateral Secured at: 21 cm Tube secured with: Tape Dental Injury: Teeth and Oropharynx as per pre-operative assessment

## 2023-09-08 ENCOUNTER — Encounter (HOSPITAL_COMMUNITY): Payer: Self-pay | Admitting: Urology

## 2023-09-11 ENCOUNTER — Other Ambulatory Visit: Payer: Self-pay | Admitting: Emergency Medicine

## 2023-09-11 DIAGNOSIS — E1169 Type 2 diabetes mellitus with other specified complication: Secondary | ICD-10-CM

## 2023-09-11 LAB — SURGICAL PATHOLOGY

## 2023-09-18 ENCOUNTER — Other Ambulatory Visit: Payer: Self-pay

## 2023-09-18 DIAGNOSIS — E1165 Type 2 diabetes mellitus with hyperglycemia: Secondary | ICD-10-CM | POA: Diagnosis not present

## 2023-09-18 DIAGNOSIS — C50411 Malignant neoplasm of upper-outer quadrant of right female breast: Secondary | ICD-10-CM

## 2023-09-19 ENCOUNTER — Inpatient Hospital Stay: Attending: Hematology and Oncology | Admitting: Hematology and Oncology

## 2023-09-19 ENCOUNTER — Inpatient Hospital Stay

## 2023-09-19 VITALS — BP 157/72 | HR 86 | Temp 98.4°F | Resp 18 | Ht 64.0 in | Wt 255.5 lb

## 2023-09-19 DIAGNOSIS — Z1732 Human epidermal growth factor receptor 2 negative status: Secondary | ICD-10-CM | POA: Insufficient documentation

## 2023-09-19 DIAGNOSIS — Z923 Personal history of irradiation: Secondary | ICD-10-CM | POA: Diagnosis not present

## 2023-09-19 DIAGNOSIS — Z79899 Other long term (current) drug therapy: Secondary | ICD-10-CM | POA: Diagnosis not present

## 2023-09-19 DIAGNOSIS — Z79811 Long term (current) use of aromatase inhibitors: Secondary | ICD-10-CM | POA: Diagnosis not present

## 2023-09-19 DIAGNOSIS — Z1721 Progesterone receptor positive status: Secondary | ICD-10-CM | POA: Diagnosis not present

## 2023-09-19 DIAGNOSIS — C50411 Malignant neoplasm of upper-outer quadrant of right female breast: Secondary | ICD-10-CM | POA: Diagnosis not present

## 2023-09-19 DIAGNOSIS — Z170 Estrogen receptor positive status [ER+]: Secondary | ICD-10-CM | POA: Diagnosis not present

## 2023-09-19 LAB — CBC WITH DIFFERENTIAL (CANCER CENTER ONLY)
Abs Immature Granulocytes: 0.02 10*3/uL (ref 0.00–0.07)
Basophils Absolute: 0.1 10*3/uL (ref 0.0–0.1)
Basophils Relative: 1 %
Eosinophils Absolute: 0.2 10*3/uL (ref 0.0–0.5)
Eosinophils Relative: 4 %
HCT: 43 % (ref 36.0–46.0)
Hemoglobin: 13.7 g/dL (ref 12.0–15.0)
Immature Granulocytes: 0 %
Lymphocytes Relative: 35 %
Lymphs Abs: 2.3 10*3/uL (ref 0.7–4.0)
MCH: 25.6 pg — ABNORMAL LOW (ref 26.0–34.0)
MCHC: 31.9 g/dL (ref 30.0–36.0)
MCV: 80.2 fL (ref 80.0–100.0)
Monocytes Absolute: 0.6 10*3/uL (ref 0.1–1.0)
Monocytes Relative: 9 %
Neutro Abs: 3.3 10*3/uL (ref 1.7–7.7)
Neutrophils Relative %: 51 %
Platelet Count: 267 10*3/uL (ref 150–400)
RBC: 5.36 MIL/uL — ABNORMAL HIGH (ref 3.87–5.11)
RDW: 15.9 % — ABNORMAL HIGH (ref 11.5–15.5)
WBC Count: 6.4 10*3/uL (ref 4.0–10.5)
nRBC: 0 % (ref 0.0–0.2)

## 2023-09-19 LAB — CMP (CANCER CENTER ONLY)
ALT: 28 U/L (ref 0–44)
AST: 30 U/L (ref 15–41)
Albumin: 4.3 g/dL (ref 3.5–5.0)
Alkaline Phosphatase: 82 U/L (ref 38–126)
Anion gap: 7 (ref 5–15)
BUN: 9 mg/dL (ref 8–23)
CO2: 28 mmol/L (ref 22–32)
Calcium: 9.8 mg/dL (ref 8.9–10.3)
Chloride: 107 mmol/L (ref 98–111)
Creatinine: 0.71 mg/dL (ref 0.44–1.00)
GFR, Estimated: >60 mL/min (ref >60)
Glucose, Bld: 128 mg/dL — ABNORMAL HIGH (ref 70–99)
Potassium: 3.3 mmol/L — ABNORMAL LOW (ref 3.5–5.1)
Sodium: 142 mmol/L (ref 135–145)
Total Bilirubin: 0.3 mg/dL (ref 0.0–1.2)
Total Protein: 7.5 g/dL (ref 6.5–8.1)

## 2023-09-19 MED ORDER — LETROZOLE 2.5 MG PO TABS
2.5000 mg | ORAL_TABLET | Freq: Every day | ORAL | 3 refills | Status: AC
Start: 2023-09-19 — End: ?

## 2023-09-19 NOTE — Assessment & Plan Note (Signed)
 02/16/2021:Screening mammogram: possible asymmetry in the right breast. Diagnostic mammogram and [place]: 0.7 cm suspicious mass in the right breast at 10:00. 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  Adjuvant chemotherapy with Taxotere and Cytoxan every 3 weeks x4 cycles (patient refused) 2. adjuvant radiation completed 05/27/21 3.  Followed by adjuvant antiestrogen therapy with letrozole started 05/24/2021 -------------------------------------------------------------------------------------------------------------------------- Letrozole toxicities: Tolerating it well without any major problems.  Denies any major hot flashes.  She does have some joint stiffness.   Breast cancer surveillance: 1.  Breast exam 09/19/2023: Benign 2. mammogram 05/05/2023: Benign breast density category B   Return to clinic in 1 year for follow-up

## 2023-09-19 NOTE — Progress Notes (Signed)
 Patient Care Team: Georgina Quint, MD as PCP - General (Internal Medicine) Shamleffer, Konrad Dolores, MD as Consulting Physician (Endocrinology) [facility] Waymon Budge, MD as Consulting Physician (Pulmonary Disease) Vivi Barrack, DPM as Consulting Physician (Podiatry) Meryl Dare, MD (Inactive) as Consulting Physician (Gastroenterology) Griselda Miner, MD as Consulting Physician (General Surgery) Serena Croissant, MD as Consulting Physician (Hematology and Oncology) Dorothy Puffer, MD as Consulting Physician (Radiation Oncology) [facility] Bonita Quin, [facility] (Pharmacist)  DIAGNOSIS:  Encounter Diagnosis  Name Primary?   Malignant neoplasm of upper-outer quadrant of right breast in female, estrogen receptor positive (HCC) Yes    SUMMARY OF ONCOLOGIC HISTORY: Oncology History  Malignant neoplasm of upper-outer quadrant of right breast in female, estrogen receptor positive (HCC)  02/16/2021 Initial Diagnosis   Screening mammogram: possible asymmetry in the right breast. Diagnostic mammogram and [place]: 0.7 cm suspicious mass in the right breast at 10:00. 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%), HER2 negative by FISH, Ki-67 5%   02/24/2021 Cancer Staging   Staging form: Breast, AJCC 8th Edition - Clinical stage from 02/24/2021: Stage IA (cT1b, cN0, cM0, G1, ER+, PR+, HER2-) - Signed by Serena Croissant, MD on 02/24/2021 Stage prefix: Initial diagnosis Histologic grading system: 3 grade system   03/12/2021 Surgery   Right lumpectomy: Grade 1 IDC 1.2 cm, low-grade DCIS, margins negative, 0/5 lymph nodes negative ER greater than 95%, PR 40%, HER2 negative, Ki-67 5%   03/12/2021 Cancer Staging   Staging form: Breast, AJCC 8th Edition - Pathologic stage from 03/12/2021: Stage IA (pT1c, pN0, cM0, G2, ER+, PR+, HER2-) - Signed by Loa Socks, NP on 08/23/2021 Stage prefix: Initial diagnosis Histologic grading system:  3 grade system   03/25/2021 Oncotype testing   Oncotype DX recurrence score: 27, distant recurrence at 9 years: 16%   04/13/2021 - 04/13/2021 Chemotherapy   Patient is on Treatment Plan : BREAST TC q21d     04/22/2021 - 05/27/2021 Radiation Therapy    Site Technique Total Dose (Gy) Dose per Fx (Gy) Completed Fx Beam Energies  Breast, Right: Breast_Rt 3D 42.56/42.56 2.66 16/16 10X  Breast, Right: Breast_Rt_Bst 3D 8/8 2 4/4 6X, 10X     06/2021 -  Anti-estrogen oral therapy   Anti-estrogen therapy with Letrozole daily     CHIEF COMPLIANT: Surveillance of breast cancer on letrozole  HISTORY OF PRESENT ILLNESS:  History of Present Illness The patient, with a history of breast cancer, presents with intermittent sharp pain in the breasts. She describes the pain as a 'weird sensation or a sharp jab of pain.' She has been compliant with her mammograms and has not noticed any changes in her breasts. She is currently on letrozole, which she tolerates well, with occasional hot flashes.     ALLERGIES:  is allergic to breo ellipta [fluticasone furoate-vilanterol], atorvastatin, hydrochlorothiazide, lisinopril, pravastatin, and simvastatin.  MEDICATIONS:  Current Outpatient Medications  Medication Sig Dispense Refill   albuterol (VENTOLIN HFA) 108 (90 Base) MCG/ACT inhaler Inhale 2 puffs into the lungs every 6 (six) hours as needed for wheezing or shortness of breath. 18 g 12   Continuous Glucose Monitor DEVI Use as directed. 1 each 0   Continuous Glucose Sensor (DEXCOM G7 SENSOR) MISC 1 Device by Does not apply route as directed. 9 each 3   Cyanocobalamin (B-12 PO) Take 1 tablet by mouth daily.     dapagliflozin propanediol (FARXIGA) 10 MG TABS tablet Take 1 tablet (10 mg total) by  mouth daily. 90 tablet 3   diclofenac Sodium (VOLTAREN) 1 % GEL Apply 2 g topically 4 (four) times daily. Rub into affected area of foot 2 to 4 times daily (Patient taking differently: Apply 2 g topically 4 (four)  times daily as needed (pain).) 100 g 2   dicyclomine (BENTYL) 10 MG capsule TAKE 1 CAPSULE BY MOUTH THREE TIMES DAILY BEFORE MEAL(S) 90 capsule 11   esomeprazole (NEXIUM) 40 MG capsule Take 1 capsule (40 mg total) by mouth daily. 90 capsule 1   estradiol (ESTRACE) 0.1 MG/GM vaginal cream Place 1 Applicatorful vaginally at bedtime. 43 g 0   ezetimibe (ZETIA) 10 MG tablet Take 1 tablet (10 mg total) by mouth daily. 90 tablet 3   famotidine (PEPCID) 20 MG tablet Take 1 tablet (20 mg total) by mouth 2 (two) times daily as needed. (Patient taking differently: Take 20 mg by mouth daily as needed for heartburn or indigestion.) 60 tablet 11   hyoscyamine (ANASPAZ) 0.125 MG TBDP disintergrating tablet Place 1 tablet (0.125 mg total) under the tongue every 6 (six) hours as needed for up to 20 doses. 20 tablet 0   insulin aspart (NOVOLOG) 100 UNIT/ML injection MAX DAILY 120 UNITS  DX E11.65 110 mL 3   Insulin Disposable Pump (OMNIPOD 5 DEXG7G6 PODS GEN 5) MISC 1 Device by Does not apply route daily. 90 each 2   letrozole (FEMARA) 2.5 MG tablet Take 1 tablet by mouth once daily 30 tablet 0   lidocaine (LIDODERM) 5 % Place 1 patch onto the skin daily. Remove & Discard patch within 12 hours or as directed by MD (Patient taking differently: Place 1 patch onto the skin daily as needed (pain). Remove & Discard patch within 12 hours or as directed by MD) 30 patch 0   metFORMIN (GLUCOPHAGE) 1000 MG tablet TAKE 1 TABLET BY MOUTH TWICE DAILY WITH A MEAL 180 tablet 1   Misc Natural Products (ELDERBERRY IMMUNE COMPLEX) CHEW Chew 1 tablet by mouth daily.     montelukast (SINGULAIR) 10 MG tablet Take 1 tablet (10 mg total) by mouth at bedtime. 90 tablet 3   Multiple Vitamin (MULTIVITAMIN WITH MINERALS) TABS tablet Take 1 tablet by mouth daily.     NARCAN 4 MG/0.1ML LIQD nasal spray kit Place 0.4 mg into the nose once.     oxyCODONE-acetaminophen (PERCOCET) 10-325 MG tablet Take 1 tablet by mouth every 8 (eight) hours as  needed for pain.     pregabalin (LYRICA) 150 MG capsule Take 150 mg by mouth 2 (two) times daily.     REPATHA SURECLICK 140 MG/ML SOAJ INJECT 140 MG SUBCUTANEOUSLY EVERY 14 DAYS 2 mL 1   rosuvastatin (CRESTOR) 10 MG tablet Take 1 tablet by mouth once daily 90 tablet 0   SYMBICORT 160-4.5 MCG/ACT inhaler Inhale 2 puffs into the lungs 2 (two) times daily. 11 g 12   tirzepatide (MOUNJARO) 5 MG/0.5ML Pen Inject 5 mg into the skin once a week. 6 mL 3   tiZANidine (ZANAFLEX) 4 MG tablet Take 4 mg by mouth 3 (three) times daily as needed for muscle spasms.     valACYclovir (VALTREX) 500 MG tablet TAKE 1 TABLET BY MOUTH TWICE DAILY FOR 5 DAYS AT  TIME  OF  OUTBREAK 20 tablet 3   valsartan (DIOVAN) 320 MG tablet Take 1 tablet (320 mg total) by mouth daily. 90 tablet 3   Vitamin D, Ergocalciferol, (DRISDOL) 1.25 MG (50000 UNIT) CAPS capsule Take 50,000 Units by mouth once a  week.     No current facility-administered medications for this visit.    PHYSICAL EXAMINATION: ECOG PERFORMANCE STATUS: 1 - Symptomatic but completely ambulatory  Vitals:   09/19/23 1436 09/19/23 1437  BP: (!) 144/70 (!) 157/72  Pulse: 86   Resp: 18   Temp: 98.4 F (36.9 C)   SpO2: 97%    Filed Weights   09/19/23 1436  Weight: 255 lb 8 oz (115.9 kg)      LABORATORY DATA:  I have reviewed the data as listed    Latest Ref Rng & Units 09/19/2023    2:02 PM 09/01/2023    9:42 AM 08/07/2023    4:12 PM  CMP  Glucose 70 - 99 mg/dL 161  096  045   BUN 8 - 23 mg/dL 9  10  8    Creatinine 0.44 - 1.00 mg/dL 4.09  8.11  9.14   Sodium 135 - 145 mmol/L 142  139  141   Potassium 3.5 - 5.1 mmol/L 3.3  3.6  3.4   Chloride 98 - 111 mmol/L 107  105  102   CO2 22 - 32 mmol/L 28  24  28    Calcium 8.9 - 10.3 mg/dL 9.8  9.6  9.3   Total Protein 6.5 - 8.1 g/dL 7.5     Total Bilirubin 0.0 - 1.2 mg/dL 0.3     Alkaline Phos 38 - 126 U/L 82     AST 15 - 41 U/L 30     ALT 0 - 44 U/L 28       Lab Results  Component Value Date   WBC  6.4 09/19/2023   HGB 13.7 09/19/2023   HCT 43.0 09/19/2023   MCV 80.2 09/19/2023   PLT 267 09/19/2023   NEUTROABS 3.3 09/19/2023    ASSESSMENT & PLAN:  Malignant neoplasm of upper-outer quadrant of right breast in female, estrogen receptor positive (HCC) 02/16/2021:Screening mammogram: possible asymmetry in the right breast. Diagnostic mammogram and [place]: 0.7 cm suspicious mass in the right breast at 10:00. 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  Adjuvant chemotherapy with Taxotere and Cytoxan every 3 weeks x4 cycles (patient refused) 2. adjuvant radiation completed 05/27/21 3.  Followed by adjuvant antiestrogen therapy with letrozole started 05/24/2021 -------------------------------------------------------------------------------------------------------------------------- Letrozole toxicities: Tolerating it well without any major problems.  Denies any major hot flashes.  She does have some joint stiffness.   Breast cancer surveillance: 1.  Breast exam 09/19/2023: Benign 2. mammogram 05/05/2023: Benign breast density category B   Return to clinic in 1 year for follow-up      No orders of the defined types were placed in this encounter.  The patient has a good understanding of the overall plan. she agrees with it. she will call with any problems that may develop before the next visit here. Total time spent: 30 mins including face to face time and time spent for planning, charting and co-ordination of care   Tamsen Meek, MD 09/19/23

## 2023-09-21 DIAGNOSIS — Z79899 Other long term (current) drug therapy: Secondary | ICD-10-CM | POA: Diagnosis not present

## 2023-09-22 DIAGNOSIS — R310 Gross hematuria: Secondary | ICD-10-CM | POA: Diagnosis not present

## 2023-09-24 DIAGNOSIS — Z780 Asymptomatic menopausal state: Secondary | ICD-10-CM | POA: Diagnosis not present

## 2023-09-25 ENCOUNTER — Other Ambulatory Visit: Payer: Self-pay | Admitting: Emergency Medicine

## 2023-09-25 DIAGNOSIS — Z79899 Other long term (current) drug therapy: Secondary | ICD-10-CM | POA: Diagnosis not present

## 2023-09-25 NOTE — Telephone Encounter (Signed)
 Copied from CRM [id number]. Topic: Clinical - Medication Refill >> Sep 25, 2023 10:56 AM Albertha Alosa wrote: Most Recent Primary Care Visit:  Provider: Tonna Frederic  Department: [facility]  Visit Type: ACUTE  Date: 08/07/2023  Medication: valsartan (DIOVAN) 320 MG tablet  Has the patient contacted their pharmacy? Yes (Agent: If no, request that the patient contact the pharmacy for the refill. If patient does not wish to contact the pharmacy document the reason why and proceed with request.) (Agent: If yes, when and what did the pharmacy advise?)  Is this the correct pharmacy for this prescription? Yes If no, delete pharmacy and type the correct one.  This is the patient's preferred pharmacy:   [facility] [place] [address] [place] [address] Phone: [phone] Fax: [phone]   Has the prescription been filled recently? No  Is the patient out of the medication? Yes  Has the patient been seen for an appointment in the last year OR does the patient have an upcoming appointment? Yes  Can we respond through MyChart? Yes  Agent: Please be advised that Rx refills may take up to 3 business days. We ask that you follow-up with your pharmacy.

## 2023-09-25 NOTE — Telephone Encounter (Signed)
 Called [facility], they state they only have a Valsartan 160mg  on file and would need the provider to call in a new order or have a new prescription sent in.

## 2023-09-27 ENCOUNTER — Other Ambulatory Visit (INDEPENDENT_AMBULATORY_CARE_PROVIDER_SITE_OTHER): Payer: Self-pay | Admitting: Pharmacist

## 2023-09-27 DIAGNOSIS — E1165 Type 2 diabetes mellitus with hyperglycemia: Secondary | ICD-10-CM

## 2023-09-27 DIAGNOSIS — Z794 Long term (current) use of insulin: Secondary | ICD-10-CM

## 2023-09-27 NOTE — Progress Notes (Signed)
 09/27/2023 Name: [name] MRN: [id number] DOB: 02/06/1961  Chief Complaint  Patient presents with   [place] Metric Diabetes    [name] is a 63 y.o. year old female who presented for a telephone visit.   They were referred to the pharmacist by a quality report for assistance in managing diabetes.   Subjective:  Care Team: Primary Care Provider: Georgina Quint, MD ; Next Scheduled Visit: 11/14/23 Endocrinologist Shamleffer; Next Scheduled Visit: 10/17/2023  Medication Access/Adherence  Current Pharmacy:  [id number] - [place] ([place]), [place] - [address] [place] ([place] [address] Phone: [phone] Fax: [phone]   Patient reports affordability concerns with their medications: No  Patient reports access/transportation concerns to their pharmacy: No  Patient reports adherence concerns with their medications:  No     Diabetes: Followed by endo - Dr. Lonzo Cloud Current medications: Farxiga 10 mg daily, Novolog max daily 120 units via insulin pump, metformin 1000 mg twice daily, Mounjaro 5 mg weekly (increased 2/2)  Dexcom 14 day average 164 (GMI 7.2) - 1% very high, 34% high, 64% in range, 1% low, 1% very low   Pt notes she had been having issues with her sensors which she believes was contributing to her elevated BG. This issue is now resolved. She was on Ozempic 2 mg until 12/2022, stopped due to GI issues. A1c increased significantly on the next A1c  Hyperlipidemia/ASCVD Risk Reduction:  Current lipid lowering medications: rosuvastatin 10 mg daily, ezetimibe 10 mg daily, Repatha 140 mg every 2 weeks Medications tried in the past: rosuvastatin 20 mg, atorvastatin 40 mg and 80 mg, pravastatin 40 mg, simvastatin 40 mg  Pt does report some pain - however difficult to tell if cause is due to PT, OA, or statin mylagia  The 10-year ASCVD risk score (Arnett DK, et al., 2019) is: 23.6%   Values used to calculate  the score:     Age: 3 years     Sex: Female     Is Non-Hispanic African American: Yes     Diabetic: Yes     Tobacco smoker: No     Systolic Blood Pressure: 157 mmHg     Is BP treated: Yes     HDL Cholesterol: 44.7 mg/dL     Total Cholesterol: 139 mg/dL    Objective:  Lab Results  Component Value Date   HGBA1C 8.2 (H) 09/01/2023    Lab Results  Component Value Date   CREATININE 0.71 09/19/2023   BUN 9 09/19/2023   NA 142 09/19/2023   K 3.3 (L) 09/19/2023   CL 107 09/19/2023   CO2 28 09/19/2023    Lab Results  Component Value Date   CHOL 139 07/18/2023   HDL 44.70 07/18/2023   LDLCALC 76 07/18/2023   TRIG 90.0 07/18/2023   CHOLHDL 3 07/18/2023    Medications Reviewed Today     Reviewed by Bonita Quin, RPH (Pharmacist) on 09/27/23 at 1336  Med List Status: <None>   Medication Order Taking? Sig Documenting Provider Last Dose Status Informant  albuterol (VENTOLIN HFA) 108 (90 Base) MCG/ACT inhaler [id number]  Inhale 2 puffs into the lungs every 6 (six) hours as needed for wheezing or shortness of breath. Jetty Duhamel D, MD  Active Self  Continuous Glucose Monitor DEVI [id number]  Use as directed. John Giovanni, MD  Active Self  Continuous Glucose Sensor (DEXCOM G7 SENSOR) MISC [id number]  1 Device by Does not apply route as  directed. Shamleffer, Julian Obey, MD  Active Self  Cyanocobalamin (B-12 PO) [id number]  Take 1 tablet by mouth daily. [provider]  Active Self  dapagliflozin propanediol (FARXIGA) 10 MG TABS tablet [id number] Yes Take 1 tablet (10 mg total) by mouth daily. Shamleffer, Julian Obey, MD Taking Active Self  diclofenac Sodium (VOLTAREN) 1 % GEL [id number]  Apply 2 g topically 4 (four) times daily. Rub into affected area of foot 2 to 4 times daily  Patient taking differently: Apply 2 g topically 4 (four) times daily as needed (pain).   Charity Conch, DPM  Active Self  dicyclomine (BENTYL) 10 MG capsule [id number]  TAKE 1  CAPSULE BY MOUTH THREE TIMES DAILY BEFORE MEAL(S) Esterwood, Amy S, PA-C  Active Self  esomeprazole (NEXIUM) 40 MG capsule [id number]  Take 1 capsule (40 mg total) by mouth daily. Kenney Peacemaker, MD  Active Self  estradiol (ESTRACE) 0.1 MG/GM vaginal cream [id number]  Place 1 Applicatorful vaginally at bedtime. Sagardia, Miguel Jose, MD  Active   ezetimibe (ZETIA) 10 MG tablet [id number] Yes Take 1 tablet (10 mg total) by mouth daily. Shamleffer, Ibtehal Jaralla, MD Taking Active Self  famotidine (PEPCID) 20 MG tablet [id number]  Take 1 tablet (20 mg total) by mouth 2 (two) times daily as needed.  Patient taking differently: Take 20 mg by mouth daily as needed for heartburn or indigestion.   Esterwood, Amy S, PA-C  Active Self  hyoscyamine (ANASPAZ) 0.125 MG TBDP disintergrating tablet [id number]  Place 1 tablet (0.125 mg total) under the tongue every 6 (six) hours as needed for up to 20 doses. Thelbert Finner, MD  Active   insulin aspart (NOVOLOG) 100 UNIT/ML injection [id number] Yes MAX DAILY 120 UNITS  DX E11.65 Shamleffer, Ibtehal Jaralla, MD Taking Active Self           Med Note Theressa Flatness Sep 07, 2023 11:53 AM) 09-06-23 1800 last dose insulin pump 25 units given  Insulin Disposable Pump (OMNIPOD 5 DEXG7G6 PODS GEN 5) MISC [id number] Yes 1 Device by Does not apply route daily. Shamleffer, Julian Obey, MD Taking Active Self  letrozole (FEMARA) 2.5 MG tablet [id number]  Take 1 tablet (2.5 mg total) by mouth daily. Gudena, Vinay, MD  Active   lidocaine (LIDODERM) 5 % [id number]  Place 1 patch onto the skin daily. Remove & Discard patch within 12 hours or as directed by MD  Patient taking differently: Place 1 patch onto the skin daily as needed (pain). Remove & Discard patch within 12 hours or as directed by MD   Burnette Carte, MD  Active Self           Med Note Timm Foot, BROOKE C   Tue Aug 29, 2023 11:40 AM)    metFORMIN (GLUCOPHAGE) 1000 MG tablet [id number] Yes TAKE 1 TABLET BY  MOUTH TWICE DAILY WITH A MEAL Shamleffer, Ibtehal Jaralla, MD Taking Active Self  Misc Natural Products [facility] IMMUNE COMPLEX) CHEW [id number]  Chew 1 tablet by mouth daily. [provider]  Active Self  montelukast (SINGULAIR) 10 MG tablet [id number]  Take 1 tablet (10 mg total) by mouth at bedtime. Faustina Hood, MD  Active Self  Multiple Vitamin (MULTIVITAMIN WITH MINERALS) TABS tablet [id number]  Take 1 tablet by mouth daily. [provider]  Active Self  NARCAN 4 MG/0.1ML LIQD nasal spray kit [id number]  Place 0.4 mg into the nose once. [provider]  Active Self  oxyCODONE-acetaminophen (PERCOCET) 10-325 MG tablet  [id number]  Take 1 tablet by mouth every 8 (eight) hours as needed for pain. [provider]  Active Self           Med Note Kolleen Perone   Tue May 17, 2022  3:29 PM)    pregabalin (LYRICA) 150 MG capsule [id number]  Take 150 mg by mouth 2 (two) times daily. [provider]  Active Self  REPATHA SURECLICK 140 MG/ML SOAJ [id number] Yes INJECT 140 MG SUBCUTANEOUSLY EVERY 14 DAYS Sagardia, Isidro Margo, MD Taking Active   rosuvastatin (CRESTOR) 10 MG tablet [id number] Yes Take 1 tablet by mouth once daily Shamleffer, Ibtehal Jaralla, MD Taking Active Self  SYMBICORT 160-4.5 MCG/ACT inhaler [id number]  Inhale 2 puffs into the lungs 2 (two) times daily. Rosa College D, MD  Active Self  tirzepatide [facility]) 5 MG/0.5ML Pen [id number] Yes Inject 5 mg into the skin once a week. Shamleffer, Julian Obey, MD Taking Active Self  tiZANidine (ZANAFLEX) 4 MG tablet [id number]  Take 4 mg by mouth 3 (three) times daily as needed for muscle spasms. [provider]  Active Self  valACYclovir (VALTREX) 500 MG tablet [id number]  TAKE 1 TABLET BY MOUTH TWICE DAILY FOR 5 DAYS AT  TIME  OF  OUTBREAK Elvira Hammersmith, MD  Active   valsartan (DIOVAN) 320 MG tablet [id number]  Take 1 tablet (320 mg total) by mouth daily. Roslyn Coombe, MD  Active  Self  Vitamin D, Ergocalciferol, (DRISDOL) 1.25 MG (50000 UNIT) CAPS capsule [id number]  Take 50,000 Units by mouth once a week. [provider]  Active Self  Med List Note Kriss Peter, [place] 01/23/13 1034): Patient is on C-PAP at night              Assessment/Plan:   Diabetes: - Currently uncontrolled, Goal A1c <7% - A1c improved 10.2 to 8.2% - Recommend to continue follow up with endo - Hoping to increase Mounjaro to 7.5 mg to help with further BG lowering   Hyperlipidemia/ASCVD Risk Reduction: - Currently uncontrolled. Goal LDL <70. - Lipid panel updated and much improved from previous.  - Continue rosuvastatin and Repatha. Recommend to discontinue ezetimibe - not needed after starting Repatha   Follow Up Plan: 11/01/23  Rainelle Bur, PharmD, BCPS, CPP Clinical Pharmacist Practitioner [facility] [phone]

## 2023-09-27 NOTE — Patient Instructions (Signed)
 It was a pleasure speaking with you today!  Follow up with endocrinology on 10/17/23.  Stop taking ezetimibe for cholesterol - continue rosuvastatin and Repatha.  Feel free to call with any questions or concerns!  Rainelle Bur, PharmD, BCPS, CPP Clinical Pharmacist Practitioner  [facility] [phone]

## 2023-09-28 MED ORDER — VALSARTAN 320 MG PO TABS: 320.0000 mg | ORAL_TABLET | Freq: Every day | ORAL | 3 refills | Status: DC

## 2023-10-02 ENCOUNTER — Telehealth: Payer: Self-pay

## 2023-10-02 ENCOUNTER — Other Ambulatory Visit (HOSPITAL_COMMUNITY): Payer: Self-pay

## 2023-10-02 NOTE — Telephone Encounter (Signed)
 Pharmacy Patient Advocate Encounter  Received notification from [employer] that Prior Authorization for REPATHA  SURECLICK 140MG /ML  has been APPROVED from 10/02/2023 to 04/02/2024. Ran test claim, Copay is $0.00. This test claim was processed through [facility]- copay amounts may vary at other pharmacies due to pharmacy/plan contracts, or as the patient moves through the different stages of their insurance plan.   PA #/Case ID/Reference #: [id number]

## 2023-10-02 NOTE — Telephone Encounter (Signed)
 Pharmacy Patient Advocate Encounter   Received notification from CoverMyMeds that prior authorization for Repatha  SureClick 140MG /ML auto-injectors is required/requested.   Insurance verification completed.   The patient is insured through [facility] .   Per test claim: PA required; PA submitted to above mentioned insurance via CoverMyMeds Key/confirmation #/EOC [id number] Status is pending

## 2023-10-03 ENCOUNTER — Other Ambulatory Visit: Payer: Self-pay | Admitting: Internal Medicine

## 2023-10-12 ENCOUNTER — Ambulatory Visit: Admitting: Internal Medicine

## 2023-10-12 ENCOUNTER — Encounter: Payer: Self-pay | Admitting: Internal Medicine

## 2023-10-12 DIAGNOSIS — E785 Hyperlipidemia, unspecified: Secondary | ICD-10-CM | POA: Diagnosis not present

## 2023-10-12 DIAGNOSIS — Z794 Long term (current) use of insulin: Secondary | ICD-10-CM

## 2023-10-12 DIAGNOSIS — E1140 Type 2 diabetes mellitus with diabetic neuropathy, unspecified: Secondary | ICD-10-CM | POA: Diagnosis not present

## 2023-10-12 DIAGNOSIS — E1165 Type 2 diabetes mellitus with hyperglycemia: Secondary | ICD-10-CM | POA: Diagnosis not present

## 2023-10-12 MED ORDER — DAPAGLIFLOZIN PROPANEDIOL 10 MG PO TABS: 10.0000 mg | ORAL_TABLET | Freq: Every day | ORAL | 3 refills | Status: DC

## 2023-10-12 MED ORDER — INSULIN ASPART 100 UNIT/ML IJ SOLN: INTRAMUSCULAR | 3 refills | Status: DC

## 2023-10-12 MED ORDER — EZETIMIBE 10 MG PO TABS: 10.0000 mg | ORAL_TABLET | Freq: Every day | ORAL | 3 refills | Status: DC

## 2023-10-12 MED ORDER — ROSUVASTATIN CALCIUM 10 MG PO TABS: 10.0000 mg | ORAL_TABLET | Freq: Every day | ORAL | 3 refills | Status: DC

## 2023-10-12 MED ORDER — TIRZEPATIDE 7.5 MG/0.5ML ~~LOC~~ SOAJ: 7.5000 mg | SUBCUTANEOUS | 3 refills | Status: DC

## 2023-10-12 MED ORDER — METFORMIN HCL 1000 MG PO TABS: 1000.0000 mg | ORAL_TABLET | Freq: Two times a day (BID) | ORAL | 3 refills | Status: DC

## 2023-10-12 NOTE — Patient Instructions (Signed)
-   Enter 14 grams with Breakfast, 16 grams with Lunch and Supper  - Enter # 6 grams with a snack  -Increase Mounjaro  7.5 mg weekly -Continue Metformin  1 tablet Twice daily  -Continue  Farxiga  10 mg , 1 tablet with breakfast   HOW TO TREAT LOW BLOOD SUGARS (Blood sugar LESS THAN 70 MG/DL) Please follow the RULE OF 15 for the treatment of hypoglycemia treatment (when your (blood sugars are less than 70 mg/dL)   STEP 1: Take 15 grams of carbohydrates when your blood sugar is low, which includes:  3-4 GLUCOSE TABS  OR 3-4 OZ OF JUICE OR REGULAR SODA OR ONE TUBE OF GLUCOSE GEL    STEP 2: RECHECK blood sugar in 15 MINUTES STEP 3: If your blood sugar is still low at the 15 minute recheck --> then, go back to STEP 1 and treat AGAIN with another 15 grams of carbohydrates.

## 2023-10-12 NOTE — Progress Notes (Signed)
 Name: [name]  Age/ Sex: 63 y.o., female   MRN/ DOB: [id number], 1961/04/14     PCP: Elvira Hammersmith, MD   Reason for Endocrinology Evaluation: Type 2 Diabetes Mellitus  Initial Endocrine Consultative Visit: 03/18/2019    [name] IDENTIFIER: [name] is a 64 y.o. female with a past medical history of T2DM, HTN, OSA and dyslipidemia , breast CA(Dx 02/2021) status post right lumpectomy chemo and radiation. [name] has followed with Endocrinology clinic since 03/18/2019 for consultative assistance with management of her diabetes.  DIABETIC HISTORY:  [name] was diagnosed with T2DM many years ago. Has been on Soliqua , Glipizide  and V-Go was started in 2019.  Her hemoglobin A1c has ranged from 8.1% in 2016, peaking at 9.8% in 2020.  On her initial visit to our clinic, [name] was on V-Go 40 with Humulin U-500 , Metformin , and Ozempic  with an A1c 9.7% . We stopped Crystal V-Go  And Crystal U-500 due to recurrent hypoglycemia . We started Novolog  Mix and continued metformin  and Ozempic .   SGLT-2 inhibitor started through PCP 03/2020  [name] stopped Ozempic  12/222 based on GI recommendations due to abdominal pain  Mounjaro  started 02/2023  SUBJECTIVE:   During Crystal last visit (07/13/2023): A1c 10.1%   Today (10/12/2023): [name] is here for a follow up on her diabetes care.  [name] checks her blood sugars multiple times daily, through freestyle libre. [name] has not  had hypoglycemic episodes since Crystal last clinic visit.   [name] continues to follow-up with oncology for Breast Ca that was diagnosed 02/2021, S/P lumpectomy , and radiation [name] continues to follow-up with pulmonary for OSA on CPAP [name] is s/p cystoscopy with biopsy through urology for a bladder mass 08/2023, with benign urothelium    Denies diarrhea or constipation  Denies nausea or vomiting     This [name] with type 2 diabetes is treated with Omnipod  (insulin  pump). During Crystal visit Crystal pump basal and bolus doses  were reviewed including carb/insulin  rations and supplemental doses. Crystal clinical list was updated. Crystal glucose meter download was reviewed in detail to determine if Crystal current pump settings are providing Crystal best glycemic control without excessive hypoglycemia.  Pump and meter download:      Pump   Omnipod Settings   Insulin  type   Novolog     Basal rate       0000 2.75 u/h    0800 3.15  I:C ratio       0000 1:1    Enter# 14 gwith breakfast, 16 g with lunch and Supper               Sensitivity       0000  15      Goal       0000  120           Type & Model of Pump: Omnipod Insulin  Type: Currently using Novolog  .  Body mass index is 43.43 kg/m.  PUMP STATISTICS: Average BG: 167 Average Daily Carbs (g): 77.1 Average Daily Basal: 43.5 (56%) Average Daily Bolus: 33.6 (44 %)    HOME DIABETES REGIMEN:  Metformin  1000 tablet Twice daily  Farxiga  10 mg daily  Mounjaro  5 mg weekly Novolog      CONTINUOUS GLUCOSE MONITORING RECORD INTERPRETATION    Dates of Recording: 4/18-10/12/2023 Sensor description: Dexcom  Results statistics:   CGM use % of time 68.8  Average and SD 167/34  Time in range 65 %  %  Time Above 180 35  % Time above 250 0  % Time Below target 0    Glycemic patterns summary: BGs are optimal throughout Crystal night and most of Crystal day  Hypoglycemic episodes occurred N/A  Hyperglycemia : Postprandial   Overnight periods: optimal   DIABETIC COMPLICATIONS: Microvascular complications:  Neuropathy  Denies: CKD, retinopathy  Last eye exam: Completed 12/07/2021   Macrovascular complications:    Denies: CAD, PVD, CVA    HISTORY:  Past Medical History:  Past Medical History:  Diagnosis Date   Allergy     Arthritis    back    Asthma    AS CHILD   Cancer (HCC)    breast CA- Right,   Chronic back pain    Chronic leg pain    due to back pain   Cocaine abuse (HCC)    in remission   COPD (chronic obstructive pulmonary disease)  (HCC)    Diabetes (HCC)    with neuropathy   Dyspnea    GERD (gastroesophageal reflux disease)    Hyperlipidemia    Hypertension    Internal hemorrhoids    Intraductal papilloma of left breast 02/18/2016   Neuromuscular disorder (HCC)    neuropathy   Personal history of radiation therapy    Postlaminectomy syndrome of lumbar region 12/07/2011   RECTAL BLEEDING 12/09/2008   Annotation: s/p EGD 7/08 mild gastritis, s/p colonoscopy 7/08- benign polyp  s/p polypectomy and isolated diverticulum.  Qualifier: Diagnosis of  By: Ditzler RN, Debra     Sciatica    per [name]    Sleep apnea    CPAP    YEARS AGO DONE 1/2 YEARS AGO AND WAS TOLD DID NOT HAVE   Tobacco abuse    Tubular adenoma of colon    Uterine fibroid    s/p hysterectomy   Past Surgical History:  Past Surgical History:  Procedure Laterality Date   BACK SURGERY  2012   L5-S1 microendoscopic disectomy last surgery 06/2011   BREAST BIOPSY     LEFT    01/19/16   BREAST BIOPSY Right 02/16/2021   BREAST EXCISIONAL BIOPSY Left 02/2016   BREAST LUMPECTOMY Right 03/12/2021   BREAST LUMPECTOMY WITH RADIOACTIVE SEED AND SENTINEL LYMPH NODE BIOPSY Right 03/12/2021   Procedure: RIGHT BREAST LUMPECTOMY WITH RADIOACTIVE SEED AND SENTINEL LYMPH NODE BIOPSY;  Surgeon: Caralyn Chandler, MD;  Location: [facility] OR;  Service: General;  Laterality: Right;   BREAST REDUCTION SURGERY  1982   CATARACT EXTRACTION Bilateral 11/07/2019   COLONOSCOPY     COLONOSCOPY WITH PROPOFOL  N/A 05/19/2022   Procedure: COLONOSCOPY WITH PROPOFOL ;  Surgeon: Asencion Blacksmith, MD;  Location: [facility];  Service: Gastroenterology;  Laterality: N/A;   CYSTOSCOPY WITH BIOPSY N/A 09/07/2023   Procedure: CYSTOSCOPY WITH BIOPSY AND BILATERAL RETROGRADE PYELOGRAM;  Surgeon: Thelbert Finner, MD;  Location: [facility];  Service: Urology;  Laterality: N/A;  30 MINUTE CASE   HAND SURGERY     MIDDLE TRIGGER FINGER RIGHT SIDE   POLYPECTOMY  05/19/2022   Procedure: POLYPECTOMY;   Surgeon: Asencion Blacksmith, MD;  Location: [facility];  Service: Gastroenterology;;   RADIOACTIVE SEED GUIDED EXCISIONAL BREAST BIOPSY Left 02/18/2016   Procedure: LEFT RADIOACTIVE SEED GUIDED EXCISIONAL BREAST BIOPSY;  Surgeon: Juanita Norlander, MD;  Location: [facility];  Service: General;  Laterality: Left;   REDUCTION MAMMAPLASTY Bilateral    TONSILLECTOMY  2008   TOTAL ABDOMINAL HYSTERECTOMY  05/24/2007   hysterectomy   TUBAL LIGATION  UPPER GASTROINTESTINAL ENDOSCOPY     Social History:  reports that [name] quit smoking about 27 years ago. Her smoking use included cigarettes. [name] started smoking about 47 years ago. [name] has a 10 pack-year smoking history. [name] has never used smokeless tobacco. [name] reports that [name] does not currently use alcohol . [name] reports that [name] does not currently use drugs after having used Crystal following drugs: Cocaine and Marijuana. Family History:  Family History  Problem Relation Age of Onset   Diabetes Father    Heart disease Father    Hypertension Father    Diabetes Mother    Cancer Mother        brain   Hypertension Mother    Kidney disease Sister    Diabetes Sister    Kidney cancer Sister    Stroke Sister    Diabetes Sister    Hypertension Sister    Diabetes Sister    Hypertension Sister    Obesity Son    Colon cancer Neg Hx    Colon polyps Neg Hx    Esophageal cancer Neg Hx    Rectal cancer Neg Hx    Stomach cancer Neg Hx      HOME MEDICATIONS: Allergies as of 10/12/2023       Reactions   Breo Ellipta  [fluticasone  Furoate-vilanterol] Other (See Comments)   Urinary retention   Atorvastatin  Other (See Comments)   Myalgia   Hydrochlorothiazide     Hypokalemia    Lisinopril Cough   Pravastatin     Simvastatin          Medication List        Accurate as of Oct 12, 2023 10:38 AM. If you have any questions, ask your nurse or doctor.          STOP taking these medications    tirzepatide  5 MG/0.5ML Pen Commonly known as:  MOUNJARO  Replaced by: tirzepatide  7.5 MG/0.5ML Pen Stopped by: Dazaria Macneill J Poppy Mcafee       TAKE these medications    albuterol  108 (90 Base) MCG/ACT inhaler Commonly known as: VENTOLIN  HFA Inhale 2 puffs into Crystal lungs every 6 (six) hours as needed for wheezing or shortness of breath.   B-12 PO Take 1 tablet by mouth daily.   Boostrix 5-2.5-18.5 LF-MCG/0.5 injection Generic drug: Tdap   Capvaxive 0.5 ML injection Generic drug: pneumococcal 21-valent conjugate vaccine   Continuous Glucose Monitor Devi Use as directed.   dapagliflozin  propanediol 10 MG Tabs tablet Commonly known as: Farxiga  Take 1 tablet (10 mg total) by mouth daily.   Dexcom G7 Sensor Misc 1 Device by Does not apply route as directed.   diclofenac  Sodium 1 % Gel Commonly known as: VOLTAREN  Apply 2 g topically 4 (four) times daily. Rub into affected area of foot 2 to 4 times daily What changed:  when to take this reasons to take this additional instructions   dicyclomine  10 MG capsule Commonly known as: BENTYL  TAKE 1 CAPSULE BY MOUTH THREE TIMES DAILY BEFORE MEAL(S)   Elderberry Immune Complex Chew Chew 1 tablet by mouth daily.   esomeprazole  40 MG capsule Commonly known as: NEXIUM  Take 1 capsule (40 mg total) by mouth daily.   estradiol  0.1 MG/GM vaginal cream Commonly known as: ESTRACE  Place 1 Applicatorful vaginally at bedtime.   ezetimibe  10 MG tablet Commonly known as: Zetia  Take 1 tablet (10 mg total) by mouth daily.   famotidine  20 MG tablet Commonly known as: PEPCID  Take 1 tablet (20 mg total) by mouth 2 (two) times daily  as needed. What changed:  when to take this reasons to take this   hyoscyamine  0.125 MG Tbdp disintergrating tablet Commonly known as: ANASPAZ  Place 1 tablet (0.125 mg total) under Crystal tongue every 6 (six) hours as needed for up to 20 doses.   insulin  aspart 100 UNIT/ML injection Commonly known as: NovoLOG  MAX DAILY 120 UNITS  DX E11.65   letrozole  2.5  MG tablet Commonly known as: FEMARA  Take 1 tablet (2.5 mg total) by mouth daily.   lidocaine  5 % Commonly known as: Lidoderm  Place 1 patch onto Crystal skin daily. Remove & Discard patch within 12 hours or as directed by MD What changed:  when to take this reasons to take this   metFORMIN  1000 MG tablet Commonly known as: GLUCOPHAGE  Take 1 tablet (1,000 mg total) by mouth 2 (two) times daily with a meal.   montelukast  10 MG tablet Commonly known as: SINGULAIR  Take 1 tablet (10 mg total) by mouth at bedtime.   multivitamin with minerals Tabs tablet Take 1 tablet by mouth daily.   Narcan 4 MG/0.1ML Liqd nasal spray kit Generic drug: naloxone Place 0.4 mg into Crystal nose once.   Omnipod 5 DexG7G6 Pods Gen 5 Misc 1 Device by Does not apply route daily.   oxyCODONE -acetaminophen  10-325 MG tablet Commonly known as: PERCOCET Take 1 tablet by mouth every 8 (eight) hours as needed for pain.   pregabalin  150 MG capsule Commonly known as: LYRICA  Take 150 mg by mouth 2 (two) times daily.   Repatha  SureClick 140 MG/ML Soaj Generic drug: Evolocumab  INJECT 140 MG SUBCUTANEOUSLY EVERY 14 DAYS   rosuvastatin  10 MG tablet Commonly known as: CRESTOR  Take 1 tablet by mouth once daily   Symbicort  160-4.5 MCG/ACT inhaler Generic drug: budesonide -formoterol  Inhale 2 puffs into Crystal lungs 2 (two) times daily.   tirzepatide  7.5 MG/0.5ML Pen Commonly known as: MOUNJARO  Inject 7.5 mg into Crystal skin once a week. Replaces: tirzepatide  5 MG/0.5ML Pen Started by: Camilla Cedar Roran Wegner   tiZANidine 4 MG tablet Commonly known as: ZANAFLEX Take 4 mg by mouth 3 (three) times daily as needed for muscle spasms.   valACYclovir  500 MG tablet Commonly known as: VALTREX  TAKE 1 TABLET BY MOUTH TWICE DAILY FOR 5 DAYS AT  TIME  OF  OUTBREAK   valsartan  320 MG tablet Commonly known as: DIOVAN  Take 1 tablet (320 mg total) by mouth daily.   Vitamin D  (Ergocalciferol ) 1.25 MG (50000 UNIT) Caps  capsule Commonly known as: DRISDOL  Take 50,000 Units by mouth once a week.         OBJECTIVE:   Vital Signs: BP 130/80 (BP Location: Left Arm, [name] Position: Sitting, Cuff Size: Normal)   Pulse 83   Ht 5\' 4"  (1.626 m)   Wt 253 lb (114.8 kg)   SpO2 97%   BMI 43.43 kg/m   Wt Readings from Last 3 Encounters:  10/12/23 253 lb (114.8 kg)  09/19/23 255 lb 8 oz (115.9 kg)  09/07/23 251 lb 15.8 oz (114.3 kg)    Exam: General:  NAD  Lungs: Clear with good BS bilat   Heart: RRR   Extremities: Trace  pretibial edema.     DM foot exam: 07/13/2023  Crystal skin of Crystal feet is intact without sores or ulcerations. Crystal pedal pulses are 2+ on right and 2+ on left. Crystal sensation is intact to a screening 5.07, 10 gram monofilament bilaterally    DATA REVIEWED:  Lab Results  Component Value Date   HGBA1C 8.2 (  H) 09/01/2023   HGBA1C 10.2 (H) 06/09/2023   HGBA1C 9.0 (A) 03/10/2023    Latest Reference Range & Units 09/19/23 14:02  Sodium 135 - 145 mmol/L 142  Potassium 3.5 - 5.1 mmol/L 3.3 (L)  Chloride 98 - 111 mmol/L 107  CO2 22 - 32 mmol/L 28  Glucose 70 - 99 mg/dL 161 (H)  BUN 8 - 23 mg/dL 9  Creatinine 0.96 - 0.45 mg/dL 4.09  Calcium  8.9 - 10.3 mg/dL 9.8  Anion gap 5 - 15  7  Alkaline Phosphatase 38 - 126 U/L 82  Albumin 3.5 - 5.0 g/dL 4.3  AST 15 - 41 U/L 30  ALT 0 - 44 U/L 28  Total Protein 6.5 - 8.1 g/dL 7.5  Total Bilirubin 0.0 - 1.2 mg/dL 0.3  GFR, Est Non African American >60 mL/min >60  (L): Data is abnormally low (H): Data is abnormally high   ASSESSMENT / PLAN / RECOMMENDATIONS:   1) Type 2 Diabetes Mellitus, Poorly   Controlled , With Neuropathic complications - Most recent A1c of 8.2%. Goal A1c < 7.0 %.    -A1c has trended down from 10.2% to 8.2% -[name] discontinued Ozempic  due to abdominal pain, tolerating Mounjaro  will increase - [name] will continue to enter  #14 grams  with a full breakfast, #16 for lunch and supper  -[name] was also advised to enter 6 g  with a snack  MEDICATIONS: -Increase Mounjaro  7.5 mg weekly -Continue Metformin  1000 mg,  1 tablet Twice daily  -Continue Farxiga  10 mg daily  - Novolog  per pump     Pump   Omnipod Settings   Insulin  type   Novolog     Basal rate       0000 2.75 u/h    0800 3.15  I:C ratio       0000 1:1    Enter# 14 gwith breakfast, 16 g with lunch and Supper               Sensitivity       0000  15      Goal       0000  120           EDUCATION / INSTRUCTIONS: BG monitoring instructions: [name] is instructed to check her blood sugars 2 times a day, fasting and bedtime . Call [facility] if: BG persistently < 70  I reviewed Crystal Rule of 15 for Crystal treatment of hypoglycemia in detail with [name]. Literature supplied.   2. Dyslipidemia :   - [name] was on Atrovastatin at somepoint but developed leg pains and we switched to Rosuvastatin  and Zetia  -Lipid panel pending   Medication  Rosuvastatin  10 mg daily  Zetia  10 mg daily    F/U in 4 months    Signed electronically by: Natale Bail, MD  [facility]  [facility] [address] [place] [place], [place] [address] Phone: [phone] FAX: [phone]   CC: Elvira Hammersmith, MD [address] [place] [address] Phone: [phone]  Fax: [phone]  Return to Endocrinology clinic as below: Future Appointments  Date Time Provider Department Center  10/12/2023 10:50 AM Fara Worthy, Julian Obey, MD [facility] None  11/01/2023  1:00 PM [facility] PHARMACIST [facility] None  11/14/2023  8:40 AM Elvira Hammersmith, MD [facility] None  11/28/2023 10:00 AM Rosa College D, MD [facility] None  02/05/2024 11:00 AM Porfirio Bristol, PA-C [facility] None  02/16/2024  8:30 AM Karri Kallenbach, Julian Obey, MD [facility]  None  07/03/2024  9:30 AM [facility]-ANNUAL WELLNESS VISIT 2 [facility] None  09/24/2024 10:00 AM Cameron Cea, MD [facility] None

## 2023-10-13 ENCOUNTER — Encounter: Payer: Self-pay | Admitting: Internal Medicine

## 2023-10-17 ENCOUNTER — Ambulatory Visit: Payer: PPO | Admitting: Internal Medicine

## 2023-10-18 DIAGNOSIS — E1165 Type 2 diabetes mellitus with hyperglycemia: Secondary | ICD-10-CM | POA: Diagnosis not present

## 2023-10-19 DIAGNOSIS — M51362 Other intervertebral disc degeneration, lumbar region with discogenic back pain and lower extremity pain: Secondary | ICD-10-CM | POA: Diagnosis not present

## 2023-10-19 DIAGNOSIS — Z79899 Other long term (current) drug therapy: Secondary | ICD-10-CM | POA: Diagnosis not present

## 2023-10-19 DIAGNOSIS — M25552 Pain in left hip: Secondary | ICD-10-CM | POA: Diagnosis not present

## 2023-10-19 DIAGNOSIS — I10 Essential (primary) hypertension: Secondary | ICD-10-CM | POA: Diagnosis not present

## 2023-10-23 DIAGNOSIS — Z79899 Other long term (current) drug therapy: Secondary | ICD-10-CM | POA: Diagnosis not present

## 2023-10-24 ENCOUNTER — Other Ambulatory Visit: Payer: Self-pay | Admitting: Emergency Medicine

## 2023-10-24 DIAGNOSIS — E1169 Type 2 diabetes mellitus with other specified complication: Secondary | ICD-10-CM

## 2023-10-24 DIAGNOSIS — N281 Cyst of kidney, acquired: Secondary | ICD-10-CM | POA: Diagnosis not present

## 2023-10-24 DIAGNOSIS — R310 Gross hematuria: Secondary | ICD-10-CM | POA: Diagnosis not present

## 2023-10-24 NOTE — Telephone Encounter (Signed)
 Should be refilled by provider who started it. Thanks

## 2023-10-31 DIAGNOSIS — M25532 Pain in left wrist: Secondary | ICD-10-CM | POA: Diagnosis not present

## 2023-10-31 DIAGNOSIS — M25531 Pain in right wrist: Secondary | ICD-10-CM | POA: Diagnosis not present

## 2023-11-01 ENCOUNTER — Other Ambulatory Visit (INDEPENDENT_AMBULATORY_CARE_PROVIDER_SITE_OTHER): Admitting: Pharmacist

## 2023-11-01 DIAGNOSIS — E1169 Type 2 diabetes mellitus with other specified complication: Secondary | ICD-10-CM

## 2023-11-01 DIAGNOSIS — E785 Hyperlipidemia, unspecified: Secondary | ICD-10-CM

## 2023-11-01 NOTE — Progress Notes (Signed)
 11/01/2023 Name: [name] MRN: [id number] DOB: 07-22-1960  Chief Complaint  Patient presents with   TNM Diabetes    [name] is a 63 y.o. year old female who presented for a telephone visit.   They were referred to the pharmacist by a quality report for assistance in managing diabetes.   Subjective:  Care Team: Primary Care Provider: Elvira Hammersmith, MD ; Next Scheduled Visit: 11/14/23 Endocrinologist Shamleffer; Next Scheduled Visit: 10/17/2023  Medication Access/Adherence  Current Pharmacy:  [id number] - [place] ([place] - [address] [place] [address] Phone: [phone] Fax: [phone]   Patient reports affordability concerns with their medications: No  Patient reports access/transportation concerns to their pharmacy: No  Patient reports adherence concerns with their medications:  No     Diabetes: Followed by endo - Dr. Rosalea Collin Current medications: Farxiga  10 mg daily, Novolog  max daily 120 units via insulin  pump, metformin  1000 mg twice daily, Mounjaro  7.5 mg weekly (increased 2/2)  A1c was 7.9% at a fair she went to last week and they checked POCT A1c Using Dexcom. She notes no high readings lately. Feels they have been lower since increasing Mounjaro . Denies n/v on higher dose Mounjaro .  She plans to start walking for exercise  She was on Ozempic  2 mg until 12/2022, stopped due to GI issues. A1c increased significantly on the next A1c  Hyperlipidemia/ASCVD Risk Reduction:  Current lipid lowering medications: rosuvastatin  10 mg daily, Repatha  140 mg every 2 weeks Medications tried in the past: rosuvastatin  20 mg, atorvastatin  40 mg and 80 mg, pravastatin  40 mg, simvastatin  40 mg -Ezetimibe  discontinued when Repatha  added  Pt does report some pain - however difficult to tell if cause is due to PT, OA, or statin mylagia  The 10-year ASCVD risk score (Arnett DK, et al., 2019) is: 15.1%    Values used to calculate the score:     Age: 52 years     Sex: Female     Is Non-Hispanic African American: Yes     Diabetic: Yes     Tobacco smoker: No     Systolic Blood Pressure: 130 mmHg     Is BP treated: Yes     HDL Cholesterol: 44.7 mg/dL     Total Cholesterol: 139 mg/dL    Objective:  Lab Results  Component Value Date   HGBA1C 8.2 (H) 09/01/2023    Lab Results  Component Value Date   CREATININE 0.71 09/19/2023   BUN 9 09/19/2023   NA 142 09/19/2023   K 3.3 (L) 09/19/2023   CL 107 09/19/2023   CO2 28 09/19/2023    Lab Results  Component Value Date   CHOL 139 07/18/2023   HDL 44.70 07/18/2023   LDLCALC 76 07/18/2023   TRIG 90.0 07/18/2023   CHOLHDL 3 07/18/2023    Medications Reviewed Today     Reviewed by Dion Frankel, RPH (Pharmacist) on 11/01/23 at 1323  Med List Status: <None>   Medication Order Taking? Sig Documenting Provider Last Dose Status Informant  albuterol  (VENTOLIN  HFA) 108 (90 Base) MCG/ACT inhaler [id number]  Inhale 2 puffs into the lungs every 6 (six) hours as needed for wheezing or shortness of breath. Faustina Hood, MD  Active Self  BOOSTRIX 5-2.5-18.5 LF-MCG/0.5 injection [id number]   [provider]  Active   CAPVAXIVE 0.5 ML injection [id number]   [provider]  Active   Continuous Glucose Monitor DEVI [id number]  Use as directed. Juliette Oh, MD  Active Self  Continuous Glucose Sensor (DEXCOM G7 SENSOR) MISC [id number]  1 Device by Does not apply route as directed. Shamleffer, Julian Obey, MD  Active Self  Cyanocobalamin (B-12 PO) [id number]  Take 1 tablet by mouth daily. [provider]  Active Self  dapagliflozin  propanediol (FARXIGA ) 10 MG TABS tablet [id number] Yes Take 1 tablet (10 mg total) by mouth daily. Shamleffer, Julian Obey, MD Taking Active   diclofenac  Sodium (VOLTAREN ) 1 % GEL [id number]  Apply 2 g topically 4 (four) times daily. Rub into affected area of foot 2 to 4 times daily   Patient taking differently: Apply 2 g topically 4 (four) times daily as needed (pain).   Charity Conch, DPM  Active Self  dicyclomine  (BENTYL ) 10 MG capsule [id number]  TAKE 1 CAPSULE BY MOUTH THREE TIMES DAILY BEFORE MEAL(S) Esterwood, Amy S, PA-C  Active Self  esomeprazole  (NEXIUM ) 40 MG capsule [id number]  Take 1 capsule (40 mg total) by mouth daily. Kenney Peacemaker, MD  Active Self  estradiol  (ESTRACE ) 0.1 MG/GM vaginal cream [id number]  Place 1 Applicatorful vaginally at bedtime. Sagardia, Miguel Jose, MD  Active   ezetimibe  (ZETIA ) 10 MG tablet [id number] No Take 1 tablet (10 mg total) by mouth daily.  Patient not taking: Reported on 11/01/2023   Shamleffer, Ibtehal Jaralla, MD Not Taking Active   famotidine  (PEPCID ) 20 MG tablet [id number]  Take 1 tablet (20 mg total) by mouth 2 (two) times daily as needed.  Patient taking differently: Take 20 mg by mouth daily as needed for heartburn or indigestion.   Esterwood, Amy S, PA-C  Active Self  hyoscyamine  (ANASPAZ ) 0.125 MG TBDP disintergrating tablet [id number]  Place 1 tablet (0.125 mg total) under the tongue every 6 (six) hours as needed for up to 20 doses. Thelbert Finner, MD  Active   insulin  aspart (NOVOLOG ) 100 UNIT/ML injection [id number] Yes MAX DAILY 120 UNITS  DX E11.65 Shamleffer, Julian Obey, MD Taking Active   Insulin  Disposable Pump (OMNIPOD 5 DEXG7G6 PODS GEN 5) MISC [id number] Yes 1 Device by Does not apply route daily. Shamleffer, Julian Obey, MD Taking Active Self  letrozole  (FEMARA ) 2.5 MG tablet [id number]  Take 1 tablet (2.5 mg total) by mouth daily. Gudena, Vinay, MD  Active   lidocaine  (LIDODERM ) 5 % [id number]  Place 1 patch onto the skin daily. Remove & Discard patch within 12 hours or as directed by MD  Patient taking differently: Place 1 patch onto the skin daily as needed (pain). Remove & Discard patch within 12 hours or as directed by MD   Burnette Carte, MD  Active Self           Med Note Timm Foot, Michae Aden   Tue Aug 29, 2023 11:40 AM)    metFORMIN  (GLUCOPHAGE ) 1000 MG tablet [id number] Yes Take 1 tablet (1,000 mg total) by mouth 2 (two) times daily with a meal. Shamleffer, Julian Obey, MD Taking Active   Misc Natural Products [facility] IMMUNE COMPLEX) CHEW [id number]  Chew 1 tablet by mouth daily. [provider]  Active Self  montelukast  (SINGULAIR ) 10 MG tablet [id number]  Take 1 tablet (10 mg total) by mouth at bedtime. Faustina Hood, MD  Active Self  Multiple Vitamin (MULTIVITAMIN WITH MINERALS) TABS tablet [id number]  Take 1 tablet by mouth daily. [provider]  Active Self  NARCAN 4 MG/0.1ML LIQD nasal spray kit [id number]  Place 0.4 mg into the nose once. [provider]  Active Self  oxyCODONE -acetaminophen  (PERCOCET) 10-325 MG tablet [id number]  Take 1 tablet by mouth every 8 (eight) hours as needed for pain. [provider]  Active Self           Med Note Kolleen Perone   Tue May 17, 2022  3:29 PM)    pregabalin  (LYRICA ) 150 MG capsule [id number]  Take 150 mg by mouth 2 (two) times daily. [provider]  Active Self  REPATHA  SURECLICK 140 MG/ML SOAJ [id number] Yes INJECT 140 MG SUBCUTANEOUSLY EVERY 14 DAYS Sagardia, Isidro Margo, MD Taking Active   rosuvastatin  (CRESTOR ) 10 MG tablet [id number] Yes Take 1 tablet (10 mg total) by mouth daily. Shamleffer, Julian Obey, MD Taking Active   SYMBICORT  160-4.5 MCG/ACT inhaler [id number]  Inhale 2 puffs into the lungs 2 (two) times daily. Rosa College D, MD  Active Self  tirzepatide  (MOUNJARO ) 7.5 MG/0.5ML Pen [id number] Yes Inject 7.5 mg into the skin once a week. Shamleffer, Ibtehal Jaralla, MD Taking Active   tiZANidine (ZANAFLEX) 4 MG tablet [id number]  Take 4 mg by mouth 3 (three) times daily as needed for muscle spasms. [provider]  Active Self  valACYclovir  (VALTREX ) 500 MG tablet [id number]  TAKE 1 TABLET BY MOUTH TWICE DAILY FOR 5 DAYS AT  TIME  OF  OUTBREAK Elvira Hammersmith, MD  Active   valsartan  (DIOVAN ) 320 MG tablet [id number]  Take 1 tablet (320 mg total) by mouth daily. Sagardia, Miguel Jose, MD  Active   Vitamin D , Ergocalciferol , (DRISDOL ) 1.25 MG (50000 UNIT) CAPS capsule [id number]  Take 50,000 Units by mouth once a week. [provider]  Active Self  Med List Note Kriss Peter, [place] 01/23/13 1034): Patient is on C-PAP at night              Assessment/Plan:   Diabetes: - Currently uncontrolled, Goal A1c <7% - A1c improved 10.2 to 8.2% - Recommend to continue follow up with endo - A1c due in June, expecting it to be <8%   Hyperlipidemia/ASCVD Risk Reduction: - Currently uncontrolled. Goal LDL <70. - Lipid panel updated and much improved from previous.  - Continue rosuvastatin  and Repatha .    Follow Up Plan:f/u after PCP f/u 6/3  Rainelle Bur, PharmD, BCPS, CPP Clinical Pharmacist Practitioner [facility] [phone]   2

## 2023-11-01 NOTE — Patient Instructions (Signed)
 It was a pleasure speaking with you today!  Continue your current regimen. Notify your endocrinologist if you begin having frequent low sugar readings.  Feel free to call with any questions or concerns!  Rainelle Bur, PharmD, BCPS, CPP Clinical Pharmacist Practitioner [facility] [phone]

## 2023-11-04 DIAGNOSIS — G4733 Obstructive sleep apnea (adult) (pediatric): Secondary | ICD-10-CM | POA: Diagnosis not present

## 2023-11-14 ENCOUNTER — Encounter: Payer: Self-pay | Admitting: Emergency Medicine

## 2023-11-14 ENCOUNTER — Ambulatory Visit (INDEPENDENT_AMBULATORY_CARE_PROVIDER_SITE_OTHER): Admitting: Emergency Medicine

## 2023-11-14 ENCOUNTER — Ambulatory Visit: Payer: PPO | Admitting: Emergency Medicine

## 2023-11-14 VITALS — BP 152/80 | HR 82 | Temp 98.2°F | Ht 64.0 in | Wt 252.2 lb

## 2023-11-14 DIAGNOSIS — E1140 Type 2 diabetes mellitus with diabetic neuropathy, unspecified: Secondary | ICD-10-CM | POA: Diagnosis not present

## 2023-11-14 DIAGNOSIS — E119 Type 2 diabetes mellitus without complications: Secondary | ICD-10-CM | POA: Diagnosis not present

## 2023-11-14 DIAGNOSIS — E785 Hyperlipidemia, unspecified: Secondary | ICD-10-CM | POA: Diagnosis not present

## 2023-11-14 DIAGNOSIS — E559 Vitamin D deficiency, unspecified: Secondary | ICD-10-CM | POA: Diagnosis not present

## 2023-11-14 DIAGNOSIS — Z79891 Long term (current) use of opiate analgesic: Secondary | ICD-10-CM | POA: Diagnosis not present

## 2023-11-14 DIAGNOSIS — E1149 Type 2 diabetes mellitus with other diabetic neurological complication: Secondary | ICD-10-CM | POA: Diagnosis not present

## 2023-11-14 DIAGNOSIS — E1165 Type 2 diabetes mellitus with hyperglycemia: Secondary | ICD-10-CM

## 2023-11-14 DIAGNOSIS — Z794 Long term (current) use of insulin: Secondary | ICD-10-CM

## 2023-11-14 DIAGNOSIS — K760 Fatty (change of) liver, not elsewhere classified: Secondary | ICD-10-CM

## 2023-11-14 DIAGNOSIS — M25552 Pain in left hip: Secondary | ICD-10-CM | POA: Diagnosis not present

## 2023-11-14 DIAGNOSIS — Z6841 Body Mass Index (BMI) 40.0 and over, adult: Secondary | ICD-10-CM

## 2023-11-14 DIAGNOSIS — I152 Hypertension secondary to endocrine disorders: Secondary | ICD-10-CM | POA: Diagnosis not present

## 2023-11-14 DIAGNOSIS — Z79899 Other long term (current) drug therapy: Secondary | ICD-10-CM | POA: Diagnosis not present

## 2023-11-14 DIAGNOSIS — C50911 Malignant neoplasm of unspecified site of right female breast: Secondary | ICD-10-CM | POA: Diagnosis not present

## 2023-11-14 DIAGNOSIS — E1159 Type 2 diabetes mellitus with other circulatory complications: Secondary | ICD-10-CM | POA: Diagnosis not present

## 2023-11-14 DIAGNOSIS — M51362 Other intervertebral disc degeneration, lumbar region with discogenic back pain and lower extremity pain: Secondary | ICD-10-CM | POA: Diagnosis not present

## 2023-11-14 DIAGNOSIS — R768 Other specified abnormal immunological findings in serum: Secondary | ICD-10-CM | POA: Diagnosis not present

## 2023-11-14 DIAGNOSIS — J449 Chronic obstructive pulmonary disease, unspecified: Secondary | ICD-10-CM | POA: Diagnosis not present

## 2023-11-14 DIAGNOSIS — I10 Essential (primary) hypertension: Secondary | ICD-10-CM | POA: Diagnosis not present

## 2023-11-14 DIAGNOSIS — E1169 Type 2 diabetes mellitus with other specified complication: Secondary | ICD-10-CM

## 2023-11-14 LAB — POCT GLYCOSYLATED HEMOGLOBIN (HGB A1C): Hemoglobin A1C: 7.2 % — AB (ref 4.0–5.6)

## 2023-11-14 MED ORDER — AMLODIPINE BESYLATE 5 MG PO TABS: 5.0000 mg | ORAL_TABLET | Freq: Every day | ORAL | 3 refills | Status: AC

## 2023-11-14 NOTE — Assessment & Plan Note (Signed)
 Diet and nutrition discussed.

## 2023-11-14 NOTE — Patient Instructions (Signed)

## 2023-11-14 NOTE — Progress Notes (Signed)
 [name] 63 y.o.   Chief Complaint  Patient presents with   Medical Management of Chronic Issues    6 month follow up. Patient wanted to make Dr.Jakaylee Sasaki aware that she will be following up with Spiritual Therapist for ongoing family/work issues    HISTORY OF PRESENT ILLNESS: This is a 63 y.o. female A1A here for 12-month follow-up of multiple chronic medical conditions. Patient is diabetic, hypertensive with history of dyslipidemia as well. Overall doing well.  Has no complaints or any other medical concerns today. Sees endocrinologist on a regular basis BP Readings from Last 3 Encounters:  10/12/23 130/80  09/19/23 (!) 157/72  09/07/23 137/69   Lab Results  Component Value Date   HGBA1C 8.2 (H) 09/01/2023      HPI   Prior to Admission medications   Medication Sig Start Date End Date Taking? Authorizing Provider  albuterol  (VENTOLIN  HFA) 108 (90 Base) MCG/ACT inhaler Inhale 2 puffs into the lungs every 6 (six) hours as needed for wheezing or shortness of breath. 08/10/23   Faustina Hood, MD  BOOSTRIX 5-2.5-18.5 LF-MCG/0.5 injection  08/05/23   [provider]  CAPVAXIVE 0.5 ML injection  08/05/23   [provider]  Continuous Glucose Monitor DEVI Use as directed. 03/28/17   Juliette Oh, MD  Continuous Glucose Sensor (DEXCOM G7 SENSOR) MISC 1 Device by Does not apply route as directed. 08/03/23   Shamleffer, Ibtehal Jaralla, MD  Cyanocobalamin (B-12 PO) Take 1 tablet by mouth daily.    [provider]  dapagliflozin  propanediol (FARXIGA ) 10 MG TABS tablet Take 1 tablet (10 mg total) by mouth daily. 10/12/23   Shamleffer, Ibtehal Jaralla, MD  diclofenac  Sodium (VOLTAREN ) 1 % GEL Apply 2 g topically 4 (four) times daily. Rub into affected area of foot 2 to 4 times daily Patient taking differently: Apply 2 g topically 4 (four) times daily as needed (pain). 01/14/22   Charity Conch, DPM  dicyclomine  (BENTYL ) 10 MG capsule TAKE 1 CAPSULE BY  MOUTH THREE TIMES DAILY BEFORE MEAL(S) 02/04/22   Esterwood, Amy S, PA-C  esomeprazole  (NEXIUM ) 40 MG capsule Take 1 capsule (40 mg total) by mouth daily. 08/01/23   Kenney Peacemaker, MD  estradiol  (ESTRACE ) 0.1 MG/GM vaginal cream Place 1 Applicatorful vaginally at bedtime. 09/01/23   Elvira Hammersmith, MD  famotidine  (PEPCID ) 20 MG tablet Take 1 tablet (20 mg total) by mouth 2 (two) times daily as needed. Patient taking differently: Take 20 mg by mouth daily as needed for heartburn or indigestion. 02/04/22   Esterwood, Amy S, PA-C  hyoscyamine  (ANASPAZ ) 0.125 MG TBDP disintergrating tablet Place 1 tablet (0.125 mg total) under the tongue every 6 (six) hours as needed for up to 20 doses. 09/07/23   Thelbert Finner, MD  insulin  aspart (NOVOLOG ) 100 UNIT/ML injection MAX DAILY 120 UNITS  DX E11.65 10/12/23   Shamleffer, Ibtehal Jaralla, MD  Insulin  Disposable Pump (OMNIPOD 5 DEXG7G6 PODS GEN 5) MISC 1 Device by Does not apply route daily. 08/10/23   Shamleffer, Ibtehal Jaralla, MD  letrozole  (FEMARA ) 2.5 MG tablet Take 1 tablet (2.5 mg total) by mouth daily. 09/19/23   Gudena, Vinay, MD  lidocaine  (LIDODERM ) 5 % Place 1 patch onto the skin daily. Remove & Discard patch within 12 hours or as directed by MD Patient taking differently: Place 1 patch onto the skin daily as needed (pain). Remove & Discard patch within 12 hours or as directed by MD 10/22/22   Burnette Carte,  MD  metFORMIN  (GLUCOPHAGE ) 1000 MG tablet Take 1 tablet (1,000 mg total) by mouth 2 (two) times daily with a meal. 10/12/23   Shamleffer, Julian Obey, MD  Misc Natural Products (ELDERBERRY IMMUNE COMPLEX) CHEW Chew 1 tablet by mouth daily.    [provider]  montelukast  (SINGULAIR ) 10 MG tablet Take 1 tablet (10 mg total) by mouth at bedtime. 08/10/23   Faustina Hood, MD  Multiple Vitamin (MULTIVITAMIN WITH MINERALS) TABS tablet Take 1 tablet by mouth daily.    [provider]  NARCAN 4 MG/0.1ML LIQD nasal spray kit  Place 0.4 mg into the nose once. 12/06/18   [provider]  oxyCODONE -acetaminophen  (PERCOCET) 10-325 MG tablet Take 1 tablet by mouth every 8 (eight) hours as needed for pain. 03/02/20   [provider]  pregabalin  (LYRICA ) 150 MG capsule Take 150 mg by mouth 2 (two) times daily. 03/03/23   [provider]  REPATHA  SURECLICK 140 MG/ML SOAJ INJECT 140 MG SUBCUTANEOUSLY EVERY 14 DAYS 10/24/23   Elvira Hammersmith, MD  rosuvastatin  (CRESTOR ) 10 MG tablet Take 1 tablet (10 mg total) by mouth daily. 10/12/23   Shamleffer, Ibtehal Jaralla, MD  SYMBICORT  160-4.5 MCG/ACT inhaler Inhale 2 puffs into the lungs 2 (two) times daily. 08/10/23   Rosa College D, MD  tirzepatide  (MOUNJARO ) 7.5 MG/0.5ML Pen Inject 7.5 mg into the skin once a week. 10/12/23   Shamleffer, Ibtehal Jaralla, MD  tiZANidine (ZANAFLEX) 4 MG tablet Take 4 mg by mouth 3 (three) times daily as needed for muscle spasms. 07/23/21   [provider]  valACYclovir  (VALTREX ) 500 MG tablet TAKE 1 TABLET BY MOUTH TWICE DAILY FOR 5 DAYS AT  TIME  OF  OUTBREAK 09/01/23   Elvira Hammersmith, MD  valsartan  (DIOVAN ) 320 MG tablet Take 1 tablet (320 mg total) by mouth daily. 09/28/23   Charisa Twitty Jose, MD  Vitamin D , Ergocalciferol , (DRISDOL ) 1.25 MG (50000 UNIT) CAPS capsule Take 50,000 Units by mouth once a week. 08/21/23   [provider]    Allergies  Allergen Reactions   Breo Ellipta  [Fluticasone  Furoate-Vilanterol] Other (See Comments)    Urinary retention   Atorvastatin  Other (See Comments)    Myalgia   Hydrochlorothiazide      Hypokalemia    Lisinopril Cough   Pravastatin     Simvastatin      Patient Active Problem List   Diagnosis Date Noted   Right flank pain 08/07/2023   Yeast vaginitis 06/15/2023   Dysuria 06/09/2023   Vaginal spotting 06/09/2023   Generalized abdominal tenderness without rebound tenderness 06/09/2023   Vaginal atrophy 06/09/2023   Cervical cancer screening  06/09/2023   Other microscopic hematuria 06/09/2023   Elevated lipase 06/09/2023   Herpes simplex vulvovaginitis 06/09/2023   Type 2 diabetes mellitus with hyperglycemia, with long-term current use of insulin  (HCC) 03/10/2023   Chronic pain of left ankle 02/14/2023   Lumbar degenerative disc disease 10/27/2022   Spinal stenosis of lumbar region without neurogenic claudication 10/27/2022   Osteoarthritis of spine with radiculopathy, lumbar region 10/27/2022   Neck pain on left side 09/28/2022   Soft tissue mass 09/28/2022   History of colonic polyps 05/19/2022   Benign neoplasm of ascending colon 05/19/2022   Benign neoplasm of transverse colon 05/19/2022   Malignant neoplasm of upper-outer quadrant of right breast in female, estrogen receptor positive (HCC) 02/22/2021   Encounter for general adult medical examination with abnormal findings 09/04/2020   Body mass index (BMI) 40.0-44.9, adult (HCC) 07/28/2020  Lumbago with sciatica, right side 07/28/2020   OSA on CPAP 10/31/2018   Stress incontinence in female 03/09/2017   Hepatic steatosis 08/29/2016   Diabetic neuropathy with neurologic complication (HCC) 10/20/2015   Long term current use of opiate analgesic 07/21/2015   COPD GOLD II  05/31/2013   Lung nodule < 6cm on CT 05/31/2013   Chronic cough 12/07/2012   Chronic pain syndrome 02/13/2012   Lumbar radiculopathy 11/29/2011   Type 2 diabetes mellitus with diabetic neuropathy (HCC) 08/25/2011   Hypertension associated with diabetes (HCC) 07/07/2009   GERD 01/08/2009   Dyslipidemia associated with type 2 diabetes mellitus (HCC) 12/09/2008   Hyperlipidemia 12/09/2008   Morbid obesity due to excess calories (HCC) 12/09/2008    Past Medical History:  Diagnosis Date   Allergy     Arthritis    back    Asthma    AS CHILD   Cancer (HCC)    breast CA- Right,   Chronic back pain    Chronic leg pain    due to back pain   Cocaine abuse (HCC)    in remission   COPD (chronic  obstructive pulmonary disease) (HCC)    Diabetes (HCC)    with neuropathy   Dyspnea    GERD (gastroesophageal reflux disease)    Hyperlipidemia    Hypertension    Internal hemorrhoids    Intraductal papilloma of left breast 02/18/2016   Neuromuscular disorder (HCC)    neuropathy   Personal history of radiation therapy    Postlaminectomy syndrome of lumbar region 12/07/2011   RECTAL BLEEDING 12/09/2008   Annotation: s/p EGD 7/08 mild gastritis, s/p colonoscopy 7/08- benign polyp  s/p polypectomy and isolated diverticulum.  Qualifier: Diagnosis of  By: Ditzler RN, Debra     Sciatica    per patient    Sleep apnea    CPAP    YEARS AGO DONE 1/2 YEARS AGO AND WAS TOLD DID NOT HAVE   Tobacco abuse    Tubular adenoma of colon    Uterine fibroid    s/p hysterectomy    Past Surgical History:  Procedure Laterality Date   BACK SURGERY  2012   L5-S1 microendoscopic disectomy last surgery 06/2011   BREAST BIOPSY     LEFT    01/19/16   BREAST BIOPSY Right 02/16/2021   BREAST EXCISIONAL BIOPSY Left 02/2016   BREAST LUMPECTOMY Right 03/12/2021   BREAST LUMPECTOMY WITH RADIOACTIVE SEED AND SENTINEL LYMPH NODE BIOPSY Right 03/12/2021   Procedure: RIGHT BREAST LUMPECTOMY WITH RADIOACTIVE SEED AND SENTINEL LYMPH NODE BIOPSY;  Surgeon: Caralyn Chandler, MD;  Location: [facility];  Service: General;  Laterality: Right;   BREAST REDUCTION SURGERY  1982   CATARACT EXTRACTION Bilateral 11/07/2019   COLONOSCOPY     COLONOSCOPY WITH PROPOFOL  N/A 05/19/2022   Procedure: COLONOSCOPY WITH PROPOFOL ;  Surgeon: Asencion Blacksmith, MD;  Location: [facility];  Service: Gastroenterology;  Laterality: N/A;   CYSTOSCOPY WITH BIOPSY N/A 09/07/2023   Procedure: CYSTOSCOPY WITH BIOPSY AND BILATERAL RETROGRADE PYELOGRAM;  Surgeon: Thelbert Finner, MD;  Location: [facility];  Service: Urology;  Laterality: N/A;  30 MINUTE CASE   HAND SURGERY     MIDDLE TRIGGER FINGER RIGHT SIDE   POLYPECTOMY  05/19/2022   Procedure:  POLYPECTOMY;  Surgeon: Asencion Blacksmith, MD;  Location: [facility];  Service: Gastroenterology;;   RADIOACTIVE SEED GUIDED EXCISIONAL BREAST BIOPSY Left 02/18/2016   Procedure: LEFT RADIOACTIVE SEED GUIDED EXCISIONAL BREAST BIOPSY;  Surgeon: Juanita Norlander, MD;  Location: [facility];  Service: General;  Laterality: Left;   REDUCTION MAMMAPLASTY Bilateral    TONSILLECTOMY  2008   TOTAL ABDOMINAL HYSTERECTOMY  05/24/2007   hysterectomy   TUBAL LIGATION     UPPER GASTROINTESTINAL ENDOSCOPY      Social History   Socioeconomic History   Marital status: Married    Spouse name: Arnetta Lank   Number of children: 3   Years of education: Not on file   Highest education level: Some college, no degree  Occupational History    Comment: [employer]  Tobacco Use   Smoking status: Former    Current packs/day: 0.00    Average packs/day: 0.5 packs/day for 20.0 years (10.0 ttl pk-yrs)    Types: Cigarettes    Start date: 07/23/1976    Quit date: 07/23/1996    Years since quitting: 27.3   Smokeless tobacco: Never  Vaping Use   Vaping status: Never Used  Substance and Sexual Activity   Alcohol  use: Not Currently    Comment: recovering addict clean for 9 years   Drug use: Not Currently    Types: Cocaine, Marijuana    Comment: recovering addict clean for 16 years   Sexual activity: Yes    Birth control/protection: Surgical  Other Topics Concern   Not on file  Social History Narrative   Current Social History 01/17/2020        Patient lives with spouse in a home which is 1 story. There are not steps up to the entrance the patient uses.       Patient's method of transportation is personal car.      The highest level of education was some college.      The patient currently works part-time.      Identified important Relationships are God, husband, kids, grandkids       Pets : None       Interests / Fun: Drawing and nature       Lives with husband-2025      Current Stressors: Pain, Covid        Religious / Personal Beliefs: Christian       Other: None    Social Drivers of Corporate investment banker Strain: Low Risk  (07/03/2023)   Overall Financial Resource Strain (CARDIA)    Difficulty of Paying Living Expenses: Not hard at all  Food Insecurity: No Food Insecurity (07/03/2023)   Hunger Vital Sign    Worried About Running Out of Food in the Last Year: Never true    Ran Out of Food in the Last Year: Never true  Transportation Needs: No Transportation Needs (07/03/2023)   PRAPARE - Administrator, Civil Service (Medical): No    Lack of Transportation (Non-Medical): No  Physical Activity: Insufficiently Active (07/03/2023)   Exercise Vital Sign    Days of Exercise per Week: 2 days    Minutes of Exercise per Session: 50 min  Stress: No Stress Concern Present (07/03/2023)   [employer] of Occupational Health - Occupational Stress Questionnaire    Feeling of Stress : Only a little  Social Connections: Socially Integrated (07/03/2023)   Social Connection and Isolation Panel [NHANES]    Frequency of Communication with Friends and Family: More than three times a week    Frequency of Social Gatherings with Friends and Family: Never    Attends Religious Services: More than 4 times per year    Active Member of Clubs or Organizations: Yes  Attends Banker Meetings: Never    Marital Status: Married  Catering manager Violence: Not At Risk (07/03/2023)   Humiliation, Afraid, Rape, and Kick questionnaire    Fear of Current or Ex-Partner: No    Emotionally Abused: No    Physically Abused: No    Sexually Abused: No    Family History  Problem Relation Age of Onset   Diabetes Father    Heart disease Father    Hypertension Father    Diabetes Mother    Cancer Mother        brain   Hypertension Mother    Kidney disease Sister    Diabetes Sister    Kidney cancer Sister    Stroke Sister    Diabetes Sister    Hypertension Sister    Diabetes  Sister    Hypertension Sister    Obesity Son    Colon cancer Neg Hx    Colon polyps Neg Hx    Esophageal cancer Neg Hx    Rectal cancer Neg Hx    Stomach cancer Neg Hx      Review of Systems  Constitutional: Negative.  Negative for chills and fever.  HENT: Negative.  Negative for congestion and sore throat.   Respiratory: Negative.  Negative for cough and shortness of breath.   Cardiovascular: Negative.  Negative for chest pain and palpitations.  Gastrointestinal:  Negative for abdominal pain, diarrhea, nausea and vomiting.  Genitourinary: Negative.  Negative for dysuria and hematuria.  Skin: Negative.  Negative for rash.  Neurological: Negative.  Negative for dizziness and headaches.  All other systems reviewed and are negative.   Today's Vitals   11/14/23 1528  BP: (!) 152/80  Pulse: 82  Temp: 98.2 F (36.8 C)  SpO2: 97%  Weight: 252 lb 3.2 oz (114.4 kg)  Height: 5\' 4"  (1.626 m)   Body mass index is 43.29 kg/m.   Physical Exam Vitals reviewed.  Constitutional:      Appearance: Normal appearance.  HENT:     Head: Normocephalic.  Eyes:     Extraocular Movements: Extraocular movements intact.  Cardiovascular:     Rate and Rhythm: Normal rate and regular rhythm.     Pulses: Normal pulses.     Heart sounds: Normal heart sounds.  Pulmonary:     Effort: Pulmonary effort is normal.     Breath sounds: Normal breath sounds.  Skin:    General: Skin is warm and dry.  Neurological:     Mental Status: She is alert and oriented to person, place, and time.  Psychiatric:        Mood and Affect: Mood normal.        Behavior: Behavior normal.    Results for orders placed or performed in visit on 11/14/23 (from the past 24 hours)  POCT HgB A1C     Status: Abnormal   Collection Time: 11/14/23  4:07 PM  Result Value Ref Range   Hemoglobin A1C 7.2 (A) 4.0 - 5.6 %   HbA1c POC (<> result, manual entry)     HbA1c, POC (prediabetic range)     HbA1c, POC (controlled diabetic  range)     *Note: Due to a large number of results and/or encounters for the requested time period, some results have not been displayed. A complete set of results can be found in Results Review.      ASSESSMENT & PLAN: A total of 46 minutes was spent with the patient and counseling/coordination of care regarding  preparing for this visit, review of most recent office visit notes, review of multiple chronic medical conditions and their management, cardiovascular risks associated with hypertension and diabetes, review of all medications, review of most recent bloodwork results, review of health maintenance items, education on nutrition, prognosis, documentation, and need for follow up.   Problem List Items Addressed This Visit       Cardiovascular and Mediastinum   Hypertension associated with diabetes (HCC) - Primary   BP Readings from Last 3 Encounters:  11/14/23 (!) 152/80  10/12/23 130/80  09/19/23 (!) 157/72  Persistently elevated blood pressure readings in the office Allergic to hydrochlorothiazide  Cardiovascular risks associated with hypertension discussed Recommend to continue valsartan  320 mg daily and start amlodipine 5 mg daily Hemoglobin A1c today at 7.2 Sees endocrinologist on a regular basis Insulin -dependent.  Also on metformin  and Mounjaro .  Had dose of Mounjaro  recently increased.  Medications and doses managed by endocrinologist office. Cardiovascular risks associated with hypertension and diabetes discussed Diet and nutrition discussed.        Relevant Medications   amLODipine (NORVASC) 5 MG tablet     Digestive   Hepatic steatosis (Chronic)   Diet and nutrition discussed        Endocrine   Diabetic neuropathy with neurologic complication (HCC) (Chronic)   Continues pregabalin  75 mg twice a day      Dyslipidemia associated with type 2 diabetes mellitus (HCC)   Continues rosuvastatin  10 mg daily Diet and nutrition discussed      Type 2 diabetes  mellitus with hyperglycemia, with long-term current use of insulin  (HCC)   Relevant Orders   POCT HgB A1C (Completed)     Other   Long term current use of opiate analgesic (Chronic)   Due to chronic pain syndrome Under the care of pain management      Morbid obesity due to excess calories (HCC)   Diet and nutrition discussed Advised to decrease amount of daily carbohydrate intake and daily calories and increase amount of plant-based protein in her diet        Body mass index (BMI) 40.0-44.9, adult [facility])   Patient Instructions  Health Maintenance, Female Adopting a healthy lifestyle and getting preventive care are important in promoting health and wellness. Ask your health care provider about: The right schedule for you to have regular tests and exams. Things you can do on your own to prevent diseases and keep yourself healthy. What should I know about diet, weight, and exercise? Eat a healthy diet  Eat a diet that includes plenty of vegetables, fruits, low-fat dairy products, and lean protein. Do not eat a lot of foods that are high in solid fats, added sugars, or sodium. Maintain a healthy weight Body mass index (BMI) is used to identify weight problems. It estimates body fat based on height and weight. Your health care provider can help determine your BMI and help you achieve or maintain a healthy weight. Get regular exercise Get regular exercise. This is one of the most important things you can do for your health. Most adults should: Exercise for at least 150 minutes each week. The exercise should increase your heart rate and make you sweat (moderate-intensity exercise). Do strengthening exercises at least twice a week. This is in addition to the moderate-intensity exercise. Spend less time sitting. Even light physical activity can be beneficial. Watch cholesterol and blood lipids Have your blood tested for lipids and cholesterol at 64 years of age, then have this test every 5  years. Have your cholesterol levels checked more often if: Your lipid or cholesterol levels are high. You are older than 63 years of age. You are at high risk for heart disease. What should I know about cancer screening? Depending on your health history and family history, you may need to have cancer screening at various ages. This may include screening for: Breast cancer. Cervical cancer. Colorectal cancer. Skin cancer. Lung cancer. What should I know about heart disease, diabetes, and high blood pressure? Blood pressure and heart disease High blood pressure causes heart disease and increases the risk of stroke. This is more likely to develop in people who have high blood pressure readings or are overweight. Have your blood pressure checked: Every 3-5 years if you are 6-37 years of age. Every year if you are 74 years old or older. Diabetes Have regular diabetes screenings. This checks your fasting blood sugar level. Have the screening done: Once every three years after age 44 if you are at a normal weight and have a low risk for diabetes. More often and at a younger age if you are overweight or have a high risk for diabetes. What should I know about preventing infection? Hepatitis B If you have a higher risk for hepatitis B, you should be screened for this virus. Talk with your health care provider to find out if you are at risk for hepatitis B infection. Hepatitis C Testing is recommended for: Everyone born from [birth year] through 1965. Anyone with known risk factors for hepatitis C. Sexually transmitted infections (STIs) Get screened for STIs, including gonorrhea and chlamydia, if: You are sexually active and are younger than 63 years of age. You are older than 63 years of age and your health care provider tells you that you are at risk for this type of infection. Your sexual activity has changed since you were last screened, and you are at increased risk for chlamydia or gonorrhea.  Ask your health care provider if you are at risk. Ask your health care provider about whether you are at high risk for HIV. Your health care provider may recommend a prescription medicine to help prevent HIV infection. If you choose to take medicine to prevent HIV, you should first get tested for HIV. You should then be tested every 3 months for as long as you are taking the medicine. Pregnancy If you are about to stop having your period (premenopausal) and you may become pregnant, seek counseling before you get pregnant. Take 400 to 800 micrograms (mcg) of folic acid every day if you become pregnant. Ask for birth control (contraception) if you want to prevent pregnancy. Osteoporosis and menopause Osteoporosis is a disease in which the bones lose minerals and strength with aging. This can result in bone fractures. If you are 19 years old or older, or if you are at risk for osteoporosis and fractures, ask your health care provider if you should: Be screened for bone loss. Take a calcium  or vitamin D  supplement to lower your risk of fractures. Be given hormone replacement therapy (HRT) to treat symptoms of menopause. Follow these instructions at home: Alcohol  use Do not drink alcohol  if: Your health care provider tells you not to drink. You are pregnant, may be pregnant, or are planning to become pregnant. If you drink alcohol : Limit how much you have to: 0-1 drink a day. Know how much alcohol  is in your drink. In the [place] one drink equals one 12 oz bottle of beer (355 mL), one 5  oz glass of wine (148 mL), or one 1 oz glass of hard liquor (44 mL). Lifestyle Do not use any products that contain nicotine or tobacco. These products include cigarettes, chewing tobacco, and vaping devices, such as e-cigarettes. If you need help quitting, ask your health care provider. Do not use street drugs. Do not share needles. Ask your health care provider for help if you need support or information about  quitting drugs. General instructions Schedule regular health, dental, and eye exams. Stay current with your vaccines. Tell your health care provider if: You often feel depressed. You have ever been abused or do not feel safe at home. Summary Adopting a healthy lifestyle and getting preventive care are important in promoting health and wellness. Follow your health care provider's instructions about healthy diet, exercising, and getting tested or screened for diseases. Follow your health care provider's instructions on monitoring your cholesterol and blood pressure. This information is not intended to replace advice given to you by your health care provider. Make sure you discuss any questions you have with your health care provider. Document Revised: 10/19/2020 Document Reviewed: 10/19/2020 Elsevier Patient Education  2024 Elsevier Inc.     Maryagnes Small, MD [facility]

## 2023-11-14 NOTE — Assessment & Plan Note (Signed)
Continues pregabalin 75 mg twice a day

## 2023-11-14 NOTE — Assessment & Plan Note (Addendum)
 BP Readings from Last 3 Encounters:  11/14/23 (!) 152/80  10/12/23 130/80  09/19/23 (!) 157/72  Persistently elevated blood pressure readings in the office Allergic to hydrochlorothiazide  Cardiovascular risks associated with hypertension discussed Recommend to continue valsartan  320 mg daily and start amlodipine 5 mg daily Hemoglobin A1c today at 7.2 Sees endocrinologist on a regular basis Insulin -dependent.  Also on metformin  and Mounjaro .  Had dose of Mounjaro  recently increased.  Medications and doses managed by endocrinologist office. Cardiovascular risks associated with hypertension and diabetes discussed Diet and nutrition discussed.

## 2023-11-14 NOTE — Assessment & Plan Note (Signed)
Due to chronic pain syndrome Under the care of pain management

## 2023-11-14 NOTE — Assessment & Plan Note (Signed)
Continues rosuvastatin 10 mg daily Diet and nutrition discussed

## 2023-11-14 NOTE — Assessment & Plan Note (Signed)
 Diet and nutrition discussed.  Advised to decrease amount of daily carbohydrate intake and daily calories and increase amount of plant-based protein in her diet.

## 2023-11-16 DIAGNOSIS — Z79899 Other long term (current) drug therapy: Secondary | ICD-10-CM | POA: Diagnosis not present

## 2023-11-18 DIAGNOSIS — E1165 Type 2 diabetes mellitus with hyperglycemia: Secondary | ICD-10-CM | POA: Diagnosis not present

## 2023-11-20 ENCOUNTER — Telehealth: Payer: Self-pay | Admitting: Pharmacy Technician

## 2023-11-20 ENCOUNTER — Other Ambulatory Visit (HOSPITAL_COMMUNITY): Payer: Self-pay

## 2023-11-20 NOTE — Telephone Encounter (Signed)
 Pharmacy Patient Advocate Encounter   Received notification from CoverMyMeds that prior authorization for Dexcom G7 Sensor is required/requested.   Insurance verification completed.   The patient is insured through [facility] .   Per test claim: PA required; PA submitted to above mentioned insurance via CoverMyMeds Key/confirmation #/EOC [id number] Status is pending

## 2023-11-21 ENCOUNTER — Other Ambulatory Visit (HOSPITAL_COMMUNITY): Payer: Self-pay

## 2023-11-21 NOTE — Telephone Encounter (Signed)
 Pharmacy Patient Advocate Encounter  Received notification from [employer] that Prior Authorization for Dexcom G7 Sensor has been APPROVED from 11/20/2023 to 06/12/2024. Ran test claim, Copay is $0.00. This test claim was processed through [facility]- copay amounts may vary at other pharmacies due to pharmacy/plan contracts, or as the patient moves through the different stages of their insurance plan.   PA #/Case ID/Reference #: [id number]

## 2023-11-26 NOTE — Progress Notes (Signed)
 HPI F former smoker followed for OSA, complicated by  HBP, COPD GOLD II, Hepatic steatosis, GERD, DM2, Lung nodule< 6 cm NPSG 08/11/17- AHI 17.7/ hr, desaturation to 85%, 255 lbs PFT- 01/31/17- moderate obst, insignif resp to BD, mild DLCO deficit -----------------------------------------------------------------------------------   08/10/23- 63 yoF former smoker followed for OSA, complicated by  HTN, COPD GOLD II, Hepatic steatosis, GERD, DM2, Sciatica, Morbid Obesity, Chronic Low Back Pain/ [facility], Breast Cancer R,  Dr Waymond Hailey has seen her for cough CAT score 10 -Ventiolin, Symbicort  160, ChlorTrimeton, Singulair ,  CPAP auto 10-20/ [facility]    AirSense 11, replaced Dec, 2024 Download- compliance 97%, AHI 0.1/hr Body weight today-255 lbs -----Wearing CPAP-doing well, needs supplies, breathing is good Continues Symbicort  and using albuterol  about 1 x/ week Got replacement CPAP machine in December and pleased with it.  Download reviewed Needs supplies authorized. COPD control good with no recent exacerbation. Needs refills. Uses rescue inhaler occasionally. CXR 04/06/23- IMPRESSION: No evidence of active cardiopulmonary disease.  11/27/23-  38 yoF former smoker followed for OSA, complicated by  HTN, COPD GOLD II, Hepatic steatosis, GERD, DM2, Sciatica, Morbid Obesity, Chronic Low Back Pain/ [facility], Breast Cancer R,  Dr Waymond Hailey has seen her for cough -Ventiolin, Symbicort  160, ChlorTrimeton, Singulair ,  CPAP auto 10-20/ [facility]    AirSense 11, replaced Dec, 2024 Download-  Body weight today-  ROS-see HPI   + = positive Constitutional:    weight loss, night sweats, fevers, chills, fatigue, lassitude. HEENT:   + headaches, difficulty swallowing, tooth/dental problems, sore throat,       sneezing, itching, ear ache, +nasal congestion, post nasal drip, snoring CV:    chest pain, orthopnea, PND, +swelling in lower extremities, anasarca,                                    dizziness, palpitations Resp:  + shortness of breath with exertion or at rest.                +productive cough,   non-productive cough, coughing up of blood.              change in color of mucus. + wheezing.   Skin:    rash or lesions. GI:  + heartburn, indigestion, +abdominal pain, nausea, vomiting, diarrhea,                 change in bowel habits, loss of appetite GU: dysuria, change in color of urine, no urgency or frequency.   flank pain. MS:   +joint pain, stiffness, decreased range of motion, back pain. Neuro-     nothing unusual Psych:  change in mood or affect.  depression or anxiety.   memory loss.  OBJ- Physical Exam General- Alert, Oriented, Affect-appropriate, Distress- none acute, + obese Skin- rash-none, lesions- none, excoriation- none Lymphadenopathy- none Head- atraumatic            Eyes- Gross vision intact, PERRLA, conjunctivae and secretions clear            Ears- Hearing, canals-normal            Nose- Clear, no-Septal dev, mucus, polyps, erosion, perforation             Throat- Mallampati IV , mucosa clear , drainage- none, tonsils- atrophic Neck- flexible , trachea midline, no stridor , thyroid  nl, carotid no bruit Chest - symmetrical excursion , unlabored  Heart/CV- RRR , no murmur , no gallop  , no rub, nl s1 s2                           - JVD- none , edema- none, stasis changes- none, varices- none           Lung- clear to P&A, wheeze- none, cough- none , dullness-none, rub- none           Chest wall-  Abd-  Br/ Gen/ Rectal- Not done, not indicated Extrem- cyanosis- none, clubbing, none, atrophy- none, strength- nl Neuro- grossly intact to observation

## 2023-11-28 ENCOUNTER — Encounter: Payer: Self-pay | Admitting: Internal Medicine

## 2023-11-28 ENCOUNTER — Ambulatory Visit: Payer: PPO | Admitting: Internal Medicine

## 2023-11-28 ENCOUNTER — Ambulatory Visit (INDEPENDENT_AMBULATORY_CARE_PROVIDER_SITE_OTHER)

## 2023-11-28 VITALS — BP 151/78 | HR 85 | Temp 98.3°F | Resp 18 | Ht 64.0 in | Wt 249.0 lb

## 2023-11-28 DIAGNOSIS — R590 Localized enlarged lymph nodes: Secondary | ICD-10-CM | POA: Diagnosis not present

## 2023-11-28 DIAGNOSIS — J449 Chronic obstructive pulmonary disease, unspecified: Secondary | ICD-10-CM

## 2023-11-28 DIAGNOSIS — Z87891 Personal history of nicotine dependence: Secondary | ICD-10-CM

## 2023-11-28 DIAGNOSIS — G4733 Obstructive sleep apnea (adult) (pediatric): Secondary | ICD-10-CM | POA: Diagnosis not present

## 2023-11-28 NOTE — Patient Instructions (Signed)
 Order- CXR   dx cervical adenopathy   Follow up on the lymph node with your primary doctor as discussed  You are doing great with CPAP- continue auto 10-20  Let us  know when you need refill on your breathing medicines

## 2023-11-30 DIAGNOSIS — G4733 Obstructive sleep apnea (adult) (pediatric): Secondary | ICD-10-CM | POA: Diagnosis not present

## 2023-12-01 ENCOUNTER — Ambulatory Visit: Payer: Self-pay | Admitting: Internal Medicine

## 2023-12-01 NOTE — Progress Notes (Signed)
ATC x1.  LMTCB. 

## 2023-12-04 ENCOUNTER — Other Ambulatory Visit: Payer: Self-pay | Admitting: Physician Assistant

## 2023-12-05 ENCOUNTER — Other Ambulatory Visit: Payer: Self-pay | Admitting: Emergency Medicine

## 2023-12-05 DIAGNOSIS — E1169 Type 2 diabetes mellitus with other specified complication: Secondary | ICD-10-CM

## 2023-12-05 DIAGNOSIS — G4733 Obstructive sleep apnea (adult) (pediatric): Secondary | ICD-10-CM | POA: Diagnosis not present

## 2023-12-06 ENCOUNTER — Telehealth: Payer: Self-pay | Admitting: Pharmacist

## 2023-12-06 NOTE — Telephone Encounter (Signed)
 Contacted patient to follow up on blood pressure readings since amlodipine  was added to her regimen. Left voicemail with direct call back number.  Darrelyn Drum, PharmD, BCPS, CPP Clinical Pharmacist Practitioner [facility] [phone]

## 2023-12-08 NOTE — Progress Notes (Signed)
 Pt notified. NFN

## 2023-12-12 DIAGNOSIS — H0102A Squamous blepharitis right eye, upper and lower eyelids: Secondary | ICD-10-CM | POA: Diagnosis not present

## 2023-12-12 DIAGNOSIS — H04123 Dry eye syndrome of bilateral lacrimal glands: Secondary | ICD-10-CM | POA: Diagnosis not present

## 2023-12-12 DIAGNOSIS — Z961 Presence of intraocular lens: Secondary | ICD-10-CM | POA: Diagnosis not present

## 2023-12-12 DIAGNOSIS — H0102B Squamous blepharitis left eye, upper and lower eyelids: Secondary | ICD-10-CM | POA: Diagnosis not present

## 2023-12-12 DIAGNOSIS — E119 Type 2 diabetes mellitus without complications: Secondary | ICD-10-CM | POA: Diagnosis not present

## 2023-12-12 DIAGNOSIS — H40013 Open angle with borderline findings, low risk, bilateral: Secondary | ICD-10-CM | POA: Diagnosis not present

## 2023-12-12 DIAGNOSIS — H1045 Other chronic allergic conjunctivitis: Secondary | ICD-10-CM | POA: Diagnosis not present

## 2023-12-12 DIAGNOSIS — H40031 Anatomical narrow angle, right eye: Secondary | ICD-10-CM | POA: Diagnosis not present

## 2023-12-12 LAB — HM DIABETES EYE EXAM

## 2023-12-13 DIAGNOSIS — J449 Chronic obstructive pulmonary disease, unspecified: Secondary | ICD-10-CM | POA: Diagnosis not present

## 2023-12-13 DIAGNOSIS — C50911 Malignant neoplasm of unspecified site of right female breast: Secondary | ICD-10-CM | POA: Diagnosis not present

## 2023-12-13 DIAGNOSIS — E119 Type 2 diabetes mellitus without complications: Secondary | ICD-10-CM | POA: Diagnosis not present

## 2023-12-13 DIAGNOSIS — M51362 Other intervertebral disc degeneration, lumbar region with discogenic back pain and lower extremity pain: Secondary | ICD-10-CM | POA: Diagnosis not present

## 2023-12-13 DIAGNOSIS — E559 Vitamin D deficiency, unspecified: Secondary | ICD-10-CM | POA: Diagnosis not present

## 2023-12-13 DIAGNOSIS — Z79899 Other long term (current) drug therapy: Secondary | ICD-10-CM | POA: Diagnosis not present

## 2023-12-13 DIAGNOSIS — R768 Other specified abnormal immunological findings in serum: Secondary | ICD-10-CM | POA: Diagnosis not present

## 2023-12-13 DIAGNOSIS — I10 Essential (primary) hypertension: Secondary | ICD-10-CM | POA: Diagnosis not present

## 2023-12-18 DIAGNOSIS — Z79899 Other long term (current) drug therapy: Secondary | ICD-10-CM | POA: Diagnosis not present

## 2023-12-18 DIAGNOSIS — E1165 Type 2 diabetes mellitus with hyperglycemia: Secondary | ICD-10-CM | POA: Diagnosis not present

## 2023-12-28 NOTE — Progress Notes (Signed)
 This encounter was created in error - please disregard.

## 2024-01-01 ENCOUNTER — Other Ambulatory Visit: Payer: Self-pay | Admitting: Internal Medicine

## 2024-01-04 DIAGNOSIS — G4733 Obstructive sleep apnea (adult) (pediatric): Secondary | ICD-10-CM | POA: Diagnosis not present

## 2024-01-09 NOTE — Procedures (Signed)
 SABRA

## 2024-01-16 ENCOUNTER — Other Ambulatory Visit: Payer: Self-pay | Admitting: Emergency Medicine

## 2024-01-16 DIAGNOSIS — E1169 Type 2 diabetes mellitus with other specified complication: Secondary | ICD-10-CM

## 2024-01-18 DIAGNOSIS — E1165 Type 2 diabetes mellitus with hyperglycemia: Secondary | ICD-10-CM | POA: Diagnosis not present

## 2024-01-22 ENCOUNTER — Other Ambulatory Visit: Payer: Self-pay | Admitting: Internal Medicine

## 2024-01-25 ENCOUNTER — Other Ambulatory Visit: Payer: Self-pay | Admitting: Internal Medicine

## 2024-01-30 DIAGNOSIS — J449 Chronic obstructive pulmonary disease, unspecified: Secondary | ICD-10-CM | POA: Diagnosis not present

## 2024-01-30 DIAGNOSIS — E119 Type 2 diabetes mellitus without complications: Secondary | ICD-10-CM | POA: Diagnosis not present

## 2024-01-30 DIAGNOSIS — R768 Other specified abnormal immunological findings in serum: Secondary | ICD-10-CM | POA: Diagnosis not present

## 2024-01-30 DIAGNOSIS — E559 Vitamin D deficiency, unspecified: Secondary | ICD-10-CM | POA: Diagnosis not present

## 2024-01-30 DIAGNOSIS — I10 Essential (primary) hypertension: Secondary | ICD-10-CM | POA: Diagnosis not present

## 2024-01-30 DIAGNOSIS — M51362 Other intervertebral disc degeneration, lumbar region with discogenic back pain and lower extremity pain: Secondary | ICD-10-CM | POA: Diagnosis not present

## 2024-01-30 DIAGNOSIS — Z79899 Other long term (current) drug therapy: Secondary | ICD-10-CM | POA: Diagnosis not present

## 2024-01-30 DIAGNOSIS — C50911 Malignant neoplasm of unspecified site of right female breast: Secondary | ICD-10-CM | POA: Diagnosis not present

## 2024-02-01 DIAGNOSIS — Z79899 Other long term (current) drug therapy: Secondary | ICD-10-CM | POA: Diagnosis not present

## 2024-02-03 ENCOUNTER — Other Ambulatory Visit: Payer: Self-pay | Admitting: Internal Medicine

## 2024-02-04 ENCOUNTER — Other Ambulatory Visit: Payer: Self-pay | Admitting: Internal Medicine

## 2024-02-04 DIAGNOSIS — G4733 Obstructive sleep apnea (adult) (pediatric): Secondary | ICD-10-CM | POA: Diagnosis not present

## 2024-02-04 DIAGNOSIS — Z794 Long term (current) use of insulin: Secondary | ICD-10-CM

## 2024-02-05 ENCOUNTER — Ambulatory Visit: Admitting: Physician Assistant

## 2024-02-07 ENCOUNTER — Telehealth: Payer: Self-pay | Admitting: Internal Medicine

## 2024-02-07 MED ORDER — INSULIN LISPRO 100 UNIT/ML IJ SOLN: INTRAMUSCULAR | 3 refills | Status: DC

## 2024-02-07 NOTE — Telephone Encounter (Signed)
 Patient is in the office stating that she needs a refill on insulin  aspart (NOVOLOG ) 100 UNIT/ML injection and the pharmacy is telling her that the medication needs a prior authorization.  Patient uses   [id number] - [place] ([place]), [place] - [address] (Ph: [phone])  Patient states that she has been out of the medication for 2 days.

## 2024-02-07 NOTE — Telephone Encounter (Signed)
 Patient aware

## 2024-02-08 ENCOUNTER — Other Ambulatory Visit: Payer: Self-pay

## 2024-02-08 MED ORDER — INSULIN LISPRO 100 UNIT/ML IJ SOLN: INTRAMUSCULAR | 3 refills | Status: DC

## 2024-02-09 ENCOUNTER — Other Ambulatory Visit: Payer: Self-pay

## 2024-02-09 DIAGNOSIS — E1165 Type 2 diabetes mellitus with hyperglycemia: Secondary | ICD-10-CM

## 2024-02-09 MED ORDER — OMNIPOD 5 DEXG7G6 PODS GEN 5 MISC: 0 refills | Status: DC

## 2024-02-16 ENCOUNTER — Encounter: Payer: Self-pay | Admitting: Internal Medicine

## 2024-02-16 ENCOUNTER — Ambulatory Visit: Admitting: Internal Medicine

## 2024-02-16 VITALS — BP 126/78 | HR 85 | Ht 64.0 in | Wt 246.0 lb

## 2024-02-16 DIAGNOSIS — E1165 Type 2 diabetes mellitus with hyperglycemia: Secondary | ICD-10-CM

## 2024-02-16 DIAGNOSIS — E1140 Type 2 diabetes mellitus with diabetic neuropathy, unspecified: Secondary | ICD-10-CM

## 2024-02-16 DIAGNOSIS — Z794 Long term (current) use of insulin: Secondary | ICD-10-CM | POA: Diagnosis not present

## 2024-02-16 LAB — POCT GLYCOSYLATED HEMOGLOBIN (HGB A1C): Hemoglobin A1C: 7.2 % — AB (ref 4.0–5.6)

## 2024-02-16 MED ORDER — DEXCOM G7 SENSOR MISC: 1.0000 | 3 refills | Status: AC

## 2024-02-16 MED ORDER — DAPAGLIFLOZIN PROPANEDIOL 10 MG PO TABS: 10.0000 mg | ORAL_TABLET | Freq: Every day | ORAL | 3 refills | Status: DC

## 2024-02-16 MED ORDER — TIRZEPATIDE 10 MG/0.5ML ~~LOC~~ SOAJ: 10.0000 mg | SUBCUTANEOUS | 3 refills | Status: DC

## 2024-02-16 MED ORDER — INSULIN LISPRO 100 UNIT/ML IJ SOLN: INTRAMUSCULAR | 3 refills | Status: AC

## 2024-02-16 MED ORDER — METFORMIN HCL 1000 MG PO TABS: 1000.0000 mg | ORAL_TABLET | Freq: Two times a day (BID) | ORAL | 3 refills | Status: AC

## 2024-02-16 MED ORDER — OMNIPOD 5 DEXG7G6 PODS GEN 5 MISC: 3 refills | Status: AC

## 2024-02-16 NOTE — Patient Instructions (Addendum)
-   Enter 14 grams with Breakfast, 16 grams with Lunch and Supper  -Increase Mounjaro  10 mg weekly -Continue Metformin  1 tablet Twice daily  -Continue  Farxiga  10 mg , 1 tablet with breakfast   HOW TO TREAT LOW BLOOD SUGARS (Blood sugar LESS THAN 70 MG/DL) Please follow the RULE OF 15 for the treatment of hypoglycemia treatment (when your (blood sugars are less than 70 mg/dL)   STEP 1: Take 15 grams of carbohydrates when your blood sugar is low, which includes:  3-4 GLUCOSE TABS  OR 3-4 OZ OF JUICE OR REGULAR SODA OR ONE TUBE OF GLUCOSE GEL    STEP 2: RECHECK blood sugar in 15 MINUTES STEP 3: If your blood sugar is still low at the 15 minute recheck --> then, go back to STEP 1 and treat AGAIN with another 15 grams of carbohydrates.

## 2024-02-16 NOTE — Progress Notes (Signed)
 Name: [name]  Age/ Sex: 63 y.o., female   MRN/ DOB: [id number], 06-01-1961     PCP: Crystal Emil Schanz, MD   Reason for Endocrinology Evaluation: Type 2 Diabetes Mellitus  Initial Endocrine Consultative Visit: 03/18/2019    PATIENT IDENTIFIER: [name] is a 63 y.o. female with a past medical history of T2DM, HTN, OSA and dyslipidemia , breast CA(Dx 02/2021) status post right lumpectomy chemo and radiation. The patient has followed with Endocrinology clinic since 03/18/2019 for consultative assistance with management of her diabetes.  DIABETIC HISTORY:  [name] was diagnosed with T2DM many years ago. Has been on Soliqua , Glipizide  and V-Go was started in 2019.  Her hemoglobin A1c has ranged from 8.1% in 2016, peaking at 9.8% in 2020.  On her initial visit to our clinic, she was on V-Go 40 with Humulin U-500 , Metformin , and Ozempic  with an A1c 9.7% . We stopped the V-Go  And the U-500 due to recurrent hypoglycemia . We started Novolog  Mix and continued metformin  and Ozempic .   SGLT-2 inhibitor started through PCP 03/2020  She stopped Ozempic  12/222 based on GI recommendations due to abdominal pain  Mounjaro  started 02/2023  SUBJECTIVE:   During the last visit (10/12/2023): A1c 8.2%   Today (02/16/2024): [name] is here for a follow up on her diabetes care.  She checks her blood sugars multiple times daily, through freestyle libre. The patient has not  had hypoglycemic episodes since the last clinic visit.   She continues to follow-up with oncology for Breast Ca that was diagnosed 02/2021, S/P lumpectomy , and radiation She continues to follow-up with pulmonary for OSA on CPAP She is s/p cystoscopy with biopsy through urology for a bladder mass 08/2023, with benign urothelium  Denies urinary frequency  Denies diarrhea or constipation  Denies nausea or vomiting     This patient with type 2 diabetes is treated with Omnipod  (insulin  pump). During the visit the pump  basal and bolus doses were reviewed including carb/insulin  rations and supplemental doses. The clinical list was updated. The glucose meter download was reviewed in detail to determine if the current pump settings are providing the best glycemic control without excessive hypoglycemia.  Pump and meter download:        Pump   Omnipod Settings   Insulin  type   Novolog     Basal rate       0000 2.75 u/h    0800 3.15  I:C ratio       0000 1:1    Enter# 14 gwith breakfast, 16 g with lunch and Supper               Sensitivity       0000  15      Goal       0000  120          Type & Model of Pump: Omnipod Insulin  Type: Currently using Novolog  .  Body mass index is 42.23 kg/m.  PUMP STATISTICS: Average BG: 172 Average Daily Carbs (g): 22.6 Average Daily Basal: 40.6 (64%) Average Daily Bolus: 22.7 (36 %)    HOME DIABETES REGIMEN:  Metformin  1000 tablet Twice daily  Farxiga  10 mg daily  Mounjaro  7.5 mg weekly Novolog      CONTINUOUS GLUCOSE MONITORING RECORD INTERPRETATION    Dates of Recording: 8/23-02/16/2024 Sensor description: Dexcom  Results statistics:   CGM use % of time 44.4  Average and SD 172/40  Time in range  63%  % Time Above 180 33  % Time above 250 4  % Time Below target 0    Glycemic patterns summary: BGs trend down overnight and fluctuate during the day Hypoglycemic episodes occurred no hypoglycemia but has been noted with tight BG's Hyperglycemia : Postprandial   Overnight periods: optimal   DIABETIC COMPLICATIONS: Microvascular complications:  Neuropathy  Denies: CKD, retinopathy  Last eye exam: Completed 12/07/2021   Macrovascular complications:    Denies: CAD, PVD, CVA    HISTORY:  Past Medical History:  Past Medical History:  Diagnosis Date   Allergy     Arthritis    back    Asthma    AS CHILD   Cancer (HCC)    breast CA- Right,   Chronic back pain    Chronic leg pain    due to back pain   Cocaine abuse (HCC)     in remission   COPD (chronic obstructive pulmonary disease) (HCC)    Diabetes (HCC)    with neuropathy   Dyspnea    GERD (gastroesophageal reflux disease)    Hyperlipidemia    Hypertension    Internal hemorrhoids    Intraductal papilloma of left breast 02/18/2016   Neuromuscular disorder (HCC)    neuropathy   Personal history of radiation therapy    Postlaminectomy syndrome of lumbar region 12/07/2011   RECTAL BLEEDING 12/09/2008   Annotation: s/p EGD 7/08 mild gastritis, s/p colonoscopy 7/08- benign polyp  s/p polypectomy and isolated diverticulum.  Qualifier: Diagnosis of  By: Ditzler RN, Debra     Sciatica    per patient    Sleep apnea    CPAP    YEARS AGO DONE 1/2 YEARS AGO AND WAS TOLD DID NOT HAVE   Tobacco abuse    Tubular adenoma of colon    Uterine fibroid    s/p hysterectomy   Past Surgical History:  Past Surgical History:  Procedure Laterality Date   BACK SURGERY  2012   L5-S1 microendoscopic disectomy last surgery 06/2011   BREAST BIOPSY     LEFT    01/19/16   BREAST BIOPSY Right 02/16/2021   BREAST EXCISIONAL BIOPSY Left 02/2016   BREAST LUMPECTOMY Right 03/12/2021   BREAST LUMPECTOMY WITH RADIOACTIVE SEED AND SENTINEL LYMPH NODE BIOPSY Right 03/12/2021   Procedure: RIGHT BREAST LUMPECTOMY WITH RADIOACTIVE SEED AND SENTINEL LYMPH NODE BIOPSY;  Surgeon: Curvin Deward MOULD, MD;  Location: [facility] OR;  Service: General;  Laterality: Right;   BREAST REDUCTION SURGERY  1982   CATARACT EXTRACTION Bilateral 11/07/2019   COLONOSCOPY     COLONOSCOPY WITH PROPOFOL  N/A 05/19/2022   Procedure: COLONOSCOPY WITH PROPOFOL ;  Surgeon: Aneita Gwendlyn DASEN, MD;  Location: [facility];  Service: Gastroenterology;  Laterality: N/A;   CYSTOSCOPY WITH BIOPSY N/A 09/07/2023   Procedure: CYSTOSCOPY WITH BIOPSY AND BILATERAL RETROGRADE PYELOGRAM;  Surgeon: Shane Steffan BROCKS, MD;  Location: [facility];  Service: Urology;  Laterality: N/A;  30 MINUTE CASE   HAND SURGERY     MIDDLE TRIGGER FINGER RIGHT  SIDE   POLYPECTOMY  05/19/2022   Procedure: POLYPECTOMY;  Surgeon: Aneita Gwendlyn DASEN, MD;  Location: [facility];  Service: Gastroenterology;;   RADIOACTIVE SEED GUIDED EXCISIONAL BREAST BIOPSY Left 02/18/2016   Procedure: LEFT RADIOACTIVE SEED GUIDED EXCISIONAL BREAST BIOPSY;  Surgeon: Alm Angle, MD;  Location: [facility] OR;  Service: General;  Laterality: Left;   REDUCTION MAMMAPLASTY Bilateral    TONSILLECTOMY  2008   TOTAL ABDOMINAL HYSTERECTOMY  05/24/2007   hysterectomy  TUBAL LIGATION     UPPER GASTROINTESTINAL ENDOSCOPY     Social History:  reports that she quit smoking about 27 years ago. Her smoking use included cigarettes. She started smoking about 47 years ago. She has a 10 pack-year smoking history. She has never used smokeless tobacco. She reports that she does not currently use alcohol . She reports that she does not currently use drugs after having used the following drugs: Cocaine and Marijuana. Family History:  Family History  Problem Relation Age of Onset   Diabetes Father    Heart disease Father    Hypertension Father    Diabetes Mother    Cancer Mother        brain   Hypertension Mother    Kidney disease Sister    Diabetes Sister    Kidney cancer Sister    Stroke Sister    Diabetes Sister    Hypertension Sister    Diabetes Sister    Hypertension Sister    Obesity Son    Colon cancer Neg Hx    Colon polyps Neg Hx    Esophageal cancer Neg Hx    Rectal cancer Neg Hx    Stomach cancer Neg Hx      HOME MEDICATIONS: Allergies as of 02/16/2024       Reactions   Breo Ellipta  [fluticasone  Furoate-vilanterol] Other (See Comments)   Urinary retention   Atorvastatin  Other (See Comments)   Myalgia   Hydrochlorothiazide     Hypokalemia    Lisinopril Cough   Pravastatin     Simvastatin          Medication List        Accurate as of February 16, 2024  8:57 AM. If you have any questions, ask your nurse or doctor.          albuterol  108 (90 Base) MCG/ACT  inhaler Commonly known as: VENTOLIN  HFA Inhale 2 puffs into the lungs every 6 (six) hours as needed for wheezing or shortness of breath.   amLODipine  5 MG tablet Commonly known as: NORVASC  Take 1 tablet (5 mg total) by mouth daily.   B-12 PO Take 1 tablet by mouth daily.   Boostrix 5-2.5-18.5 LF-MCG/0.5 injection Generic drug: Tdap   Capvaxive 0.5 ML injection Generic drug: pneumococcal 21-valent conjugate vaccine   Continuous Glucose Monitor Devi Use as directed.   dapagliflozin  propanediol 10 MG Tabs tablet Commonly known as: Farxiga  Take 1 tablet (10 mg total) by mouth daily.   Dexcom G7 Sensor Misc 1 Device by Does not apply route as directed.   diclofenac  Sodium 1 % Gel Commonly known as: VOLTAREN  Apply 2 g topically 4 (four) times daily. Rub into affected area of foot 2 to 4 times daily   dicyclomine  10 MG capsule Commonly known as: BENTYL  TAKE 1 CAPSULE BY MOUTH THREE TIMES DAILY BEFORE MEAL(S)   Elderberry Immune Complex Chew Chew 1 tablet by mouth daily.   esomeprazole  40 MG capsule Commonly known as: NEXIUM  TAKE ONE CAPSULE (40 MG TOTAL) BY MOUTH DAILY AT 9AM   estradiol  0.1 MG/GM vaginal cream Commonly known as: ESTRACE  Place 1 Applicatorful vaginally at bedtime.   famotidine  20 MG tablet Commonly known as: PEPCID  Take 1 tablet (20 mg total) by mouth 2 (two) times daily as needed.   hyoscyamine  0.125 MG Tbdp disintergrating tablet Commonly known as: ANASPAZ  Place 1 tablet (0.125 mg total) under the tongue every 6 (six) hours as needed for up to 20 doses.   insulin  lispro 100 UNIT/ML injection Commonly  known as: HumaLOG  Max Daily 110 units per pump   letrozole  2.5 MG tablet Commonly known as: FEMARA  Take 1 tablet (2.5 mg total) by mouth daily.   lidocaine  5 % Commonly known as: Lidoderm  Place 1 patch onto the skin daily. Remove & Discard patch within 12 hours or as directed by MD   metFORMIN  1000 MG tablet Commonly known as:  GLUCOPHAGE  Take 1 tablet (1,000 mg total) by mouth 2 (two) times daily with a meal.   montelukast  10 MG tablet Commonly known as: SINGULAIR  Take 1 tablet (10 mg total) by mouth at bedtime.   Mounjaro  7.5 MG/0.5ML Pen Generic drug: tirzepatide  INJECT 7.5 MG SUBCUTANEOUSLY ONCE A WEEK   multivitamin with minerals Tabs tablet Take 1 tablet by mouth daily.   Narcan 4 MG/0.1ML Liqd nasal spray kit Generic drug: naloxone Place 0.4 mg into the nose once.   Omnipod 5 DexG7G6 Pods Gen 5 Misc Change pod every 3 days   oxyCODONE -acetaminophen  10-325 MG tablet Commonly known as: PERCOCET Take 1 tablet by mouth every 8 (eight) hours as needed for pain.   pregabalin  150 MG capsule Commonly known as: LYRICA  Take 150 mg by mouth 2 (two) times daily.   Repatha  SureClick 140 MG/ML Soaj Generic drug: Evolocumab  INJECT 140 MG SUBCUTANEOUSLY EVERY 14 DAYS   rosuvastatin  10 MG tablet Commonly known as: CRESTOR  Take 1 tablet (10 mg total) by mouth daily.   Symbicort  160-4.5 MCG/ACT inhaler Generic drug: budesonide -formoterol  Inhale 2 puffs into the lungs 2 (two) times daily.   tiZANidine 4 MG tablet Commonly known as: ZANAFLEX Take 4 mg by mouth 3 (three) times daily as needed for muscle spasms.   valACYclovir  500 MG tablet Commonly known as: VALTREX  TAKE 1 TABLET BY MOUTH TWICE DAILY FOR 5 DAYS AT  TIME  OF  OUTBREAK   valsartan  320 MG tablet Commonly known as: DIOVAN  Take 1 tablet (320 mg total) by mouth daily.   Vitamin D  (Ergocalciferol ) 1.25 MG (50000 UNIT) Caps capsule Commonly known as: DRISDOL  Take 50,000 Units by mouth once a week.         OBJECTIVE:   Vital Signs: BP 126/78 (BP Location: Left Arm, Patient Position: Sitting, Cuff Size: Normal)   Pulse 85   Ht 5' 4 (1.626 m)   Wt 246 lb (111.6 kg)   SpO2 96%   BMI 42.23 kg/m   Wt Readings from Last 3 Encounters:  02/16/24 246 lb (111.6 kg)  11/28/23 249 lb (112.9 kg)  11/14/23 252 lb 3.2 oz (114.4 kg)     Exam: General:  NAD  Lungs: Clear with good BS bilat   Heart: RRR   Extremities: Trace  pretibial edema.     DM foot exam: 07/13/2023  The skin of the feet is intact without sores or ulcerations. The pedal pulses are 2+ on right and 2+ on left. The sensation is intact to a screening 5.07, 10 gram monofilament bilaterally    DATA REVIEWED:  Lab Results  Component Value Date   HGBA1C 7.2 (A) 02/16/2024   HGBA1C 7.2 (A) 11/14/2023   HGBA1C 8.2 (H) 09/01/2023    Latest Reference Range & Units 09/19/23 14:02  Sodium 135 - 145 mmol/L 142  Potassium 3.5 - 5.1 mmol/L 3.3 (L)  Chloride 98 - 111 mmol/L 107  CO2 22 - 32 mmol/L 28  Glucose 70 - 99 mg/dL 871 (H)  BUN 8 - 23 mg/dL 9  Creatinine 9.55 - 8.99 mg/dL 9.28  Calcium  8.9 - 10.3 mg/dL  9.8  Anion gap 5 - 15  7  Alkaline Phosphatase 38 - 126 U/L 82  Albumin 3.5 - 5.0 g/dL 4.3  AST 15 - 41 U/L 30  ALT 0 - 44 U/L 28  Total Protein 6.5 - 8.1 g/dL 7.5  Total Bilirubin 0.0 - 1.2 mg/dL 0.3  GFR, Est Non African American >60 mL/min >60  (L): Data is abnormally low (H): Data is abnormally high   ASSESSMENT / PLAN / RECOMMENDATIONS:   1) Type 2 Diabetes Mellitus, Optimally  Controlled , With Neuropathic complications - Most recent A1c of 7.2%. Goal A1c < 7.0 %.    -A1c has trended down -She discontinued Ozempic  due to abdominal pain, tolerating Mounjaro  will increase - She will continue to enter  #14 grams  with a full breakfast, #16 for lunch and supper  - I have reduce her basal rate as below to prevent hypoglycemia with increase Mounjaro  dose  MEDICATIONS: -Increase Mounjaro  10 mg weekly -Continue Metformin  1000 mg,  1 tablet Twice daily  -Continue Farxiga  10 mg daily  - Novolog  per pump     Pump   Omnipod Settings   Insulin  type   Novolog     Basal rate       0000 2.70 u/h    0800 3.10  I:C ratio       0000 1:1    Enter# 14 gwith breakfast, 16 g with lunch and Supper               Sensitivity       0000   15      Goal       0000  120           EDUCATION / INSTRUCTIONS: BG monitoring instructions: Patient is instructed to check her blood sugars 2 times a day, fasting and bedtime . Call  Endocrinology clinic if: BG persistently < 70  I reviewed the Rule of 15 for the treatment of hypoglycemia in detail with the patient. Literature supplied.   2. Dyslipidemia :   - She was on Atrovastatin at somepoint but developed leg pains and we switched to Rosuvastatin  and Zetia  -Lipid panel pending   Medication  Rosuvastatin  10 mg daily  Zetia  10 mg daily    F/U in 4 months    Signed electronically by: Stefano Redgie Butts, MD  [facility]  [facility] [address] [place] [place], [place] [address] Phone: [phone] FAX: [phone]   CC: Crystal Emil Schanz, MD [address] [place] [address] Phone: [phone]  Fax: [phone]  Return to Endocrinology clinic as below: Future Appointments  Date Time Provider Department Center  05/15/2024  8:40 AM Crystal Emil Schanz, MD [facility]  07/03/2024  9:30 AM [facility]ANNUAL WELLNESS VISIT [facility]  09/24/2024 10:00 AM Odean Potts, MD [facility] None

## 2024-02-17 ENCOUNTER — Other Ambulatory Visit: Payer: Self-pay | Admitting: Internal Medicine

## 2024-02-18 DIAGNOSIS — E1165 Type 2 diabetes mellitus with hyperglycemia: Secondary | ICD-10-CM | POA: Diagnosis not present

## 2024-02-20 ENCOUNTER — Telehealth: Payer: Self-pay | Admitting: Pharmacy Technician

## 2024-02-20 ENCOUNTER — Other Ambulatory Visit (HOSPITAL_COMMUNITY): Payer: Self-pay

## 2024-02-20 NOTE — Telephone Encounter (Signed)
 Pharmacy Patient Advocate Encounter   Received notification from CoverMyMeds that prior authorization for Omnipod 5 DexG7G6 Intro Gen 5 kit is required/requested.   Insurance verification completed.   The patient is insured through [facility] .  Action: Medication has been discontinued. Archived Key: [id number]  **Patient already received the intro kit and she is now using the regular pods.**

## 2024-02-27 ENCOUNTER — Other Ambulatory Visit: Payer: Self-pay | Admitting: Emergency Medicine

## 2024-02-27 DIAGNOSIS — E1169 Type 2 diabetes mellitus with other specified complication: Secondary | ICD-10-CM

## 2024-02-28 DIAGNOSIS — I10 Essential (primary) hypertension: Secondary | ICD-10-CM | POA: Diagnosis not present

## 2024-02-28 DIAGNOSIS — M51362 Other intervertebral disc degeneration, lumbar region with discogenic back pain and lower extremity pain: Secondary | ICD-10-CM | POA: Diagnosis not present

## 2024-02-28 DIAGNOSIS — Z79899 Other long term (current) drug therapy: Secondary | ICD-10-CM | POA: Diagnosis not present

## 2024-03-01 ENCOUNTER — Other Ambulatory Visit: Payer: Self-pay | Admitting: Primary Care

## 2024-03-04 ENCOUNTER — Other Ambulatory Visit: Payer: Self-pay | Admitting: Internal Medicine

## 2024-03-19 DIAGNOSIS — E1165 Type 2 diabetes mellitus with hyperglycemia: Secondary | ICD-10-CM | POA: Diagnosis not present

## 2024-03-26 DIAGNOSIS — G4733 Obstructive sleep apnea (adult) (pediatric): Secondary | ICD-10-CM | POA: Diagnosis not present

## 2024-03-27 DIAGNOSIS — Z79899 Other long term (current) drug therapy: Secondary | ICD-10-CM | POA: Diagnosis not present

## 2024-03-27 DIAGNOSIS — M51362 Other intervertebral disc degeneration, lumbar region with discogenic back pain and lower extremity pain: Secondary | ICD-10-CM | POA: Diagnosis not present

## 2024-03-27 DIAGNOSIS — J449 Chronic obstructive pulmonary disease, unspecified: Secondary | ICD-10-CM | POA: Diagnosis not present

## 2024-03-27 DIAGNOSIS — R7689 Other specified abnormal immunological findings in serum: Secondary | ICD-10-CM | POA: Diagnosis not present

## 2024-03-27 DIAGNOSIS — E119 Type 2 diabetes mellitus without complications: Secondary | ICD-10-CM | POA: Diagnosis not present

## 2024-03-27 DIAGNOSIS — I10 Essential (primary) hypertension: Secondary | ICD-10-CM | POA: Diagnosis not present

## 2024-03-27 DIAGNOSIS — E559 Vitamin D deficiency, unspecified: Secondary | ICD-10-CM | POA: Diagnosis not present

## 2024-03-27 DIAGNOSIS — R7681 Abnormal rheumatoid factor and anti-citrullinated protein antibody without rheumatoid arthritis: Secondary | ICD-10-CM | POA: Diagnosis not present

## 2024-03-29 DIAGNOSIS — Z79899 Other long term (current) drug therapy: Secondary | ICD-10-CM | POA: Diagnosis not present

## 2024-04-02 ENCOUNTER — Encounter: Payer: Self-pay | Admitting: Emergency Medicine

## 2024-04-02 ENCOUNTER — Ambulatory Visit (INDEPENDENT_AMBULATORY_CARE_PROVIDER_SITE_OTHER): Admitting: Emergency Medicine

## 2024-04-02 VITALS — BP 138/80 | HR 94 | Temp 100.2°F | Wt 247.2 lb

## 2024-04-02 DIAGNOSIS — R519 Headache, unspecified: Secondary | ICD-10-CM

## 2024-04-02 DIAGNOSIS — K05219 Aggressive periodontitis, localized, unspecified severity: Secondary | ICD-10-CM | POA: Insufficient documentation

## 2024-04-02 DIAGNOSIS — K0889 Other specified disorders of teeth and supporting structures: Secondary | ICD-10-CM | POA: Insufficient documentation

## 2024-04-02 MED ORDER — AMOXICILLIN-POT CLAVULANATE 875-125 MG PO TABS: 1.0000 | ORAL_TABLET | Freq: Two times a day (BID) | ORAL | 0 refills | Status: AC

## 2024-04-02 MED ORDER — TRAMADOL HCL 50 MG PO TABS: 50.0000 mg | ORAL_TABLET | Freq: Three times a day (TID) | ORAL | 1 refills | Status: AC | PRN

## 2024-04-02 NOTE — Patient Instructions (Signed)
 Dental Abscess  A dental abscess is an area of pus in or around a tooth. It comes from an infection. It can cause pain and other symptoms. Treatment will help with symptoms and prevent the infection from spreading. What are the causes? This condition is caused by an infection in or around the tooth. This can be from: Very bad tooth decay (cavities). A bad injury to the tooth, such as a broken or chipped tooth. What increases the risk? The risk to get an abscess is higher in males. It is also more likely in people who: Have dental decay. Have very bad gum disease. Eat sugary snacks between meals. Use tobacco. Have diabetes. Have a weak disease-fighting system (immune system). Do not brush their teeth regularly. What are the signs or symptoms? Some mild symptoms are: Tenderness. Bad breath. Fever. A sharp, sour taste in the mouth. Pain in and around the infected tooth. Worse symptoms of this condition include: Swollen neck glands. Chills. Pus draining around the tooth. Swelling and redness around the tooth, the mouth, or the face. Very bad pain in and around the tooth. The worst symptoms can include: Difficulty swallowing. Difficulty opening your mouth. Feeling like you may vomit or vomiting. How is this treated? This is treated by getting rid of the infection. Your dentist will discuss ways to do this, including: Antibiotic medicines. Antibacterial mouth rinse. An incision in the abscess to drain out the pus. A root canal. Removing the tooth. Follow these instructions at home: Medicines Take over-the-counter and prescription medicines only as told by your dentist. If you were prescribed an antibiotic medicine, take it as told by your dentist. Do not stop taking it even if you start to feel better. If you were prescribed a gel that has numbing medicine in it, use it exactly as told. Ask your dentist if you should avoid driving or using machines while you are taking your  medicine. General instructions Rinse your mouth often with salt water. To make salt water, dissolve -1 tsp (3-6 g) of salt in 1 cup (237 mL) of warm water. Eat a soft diet while your mouth is healing. Drink enough fluid to keep your pee (urine) pale yellow. Do not apply heat to the outside of your mouth. Do not smoke or use any products that contain nicotine or tobacco. If you need help quitting, ask your dentist. Keep all follow-up visits. Prevent an abscess Brush your teeth every morning and every night. Use fluoride toothpaste. Floss your teeth each day. Get dental cleanings as often as told by your dentist. Think about getting dental sealant put on teeth that have deep holes (decay). Drink water that has fluoride in it. Most tap water has fluoride. Check the label on bottled water to see if it has fluoride in it. Drink water instead of sugary drinks. Eat healthy meals and snacks. Wear a mouth guard or face shield when you play sports. Contact a doctor if: Your pain is worse and medicine does not help. Get help right away if: You have a fever or chills. Your symptoms suddenly get worse. You have a very bad headache. You have problems breathing or swallowing. You have trouble opening your mouth. You have swelling in your neck or close to your eye. These symptoms may be an emergency. Get help right away. Call your local emergency services (911 in the [place] Do not wait to see if the symptoms will go away. Do not drive yourself to the hospital. Summary A dental abscess  is an area of pus in or around a tooth. It is caused by an infection. Treatment will help with symptoms and prevent the infection from spreading. Take over-the-counter and prescription medicines only as told by your dentist. To prevent an abscess, take good care of your teeth. Brush your teeth every morning and night. Use floss every day. Get dental cleanings as often as told by your dentist. This information is  not intended to replace advice given to you by your health care provider. Make sure you discuss any questions you have with your health care provider. Document Revised: 08/05/2020 Document Reviewed: 08/06/2020 Elsevier Patient Education  2024 [employer].

## 2024-04-02 NOTE — Progress Notes (Signed)
 [name] 63 y.o.   Chief Complaint  Patient presents with   Facial Swelling    Pt noticed left side pain and swelling. She reports pain on the whole left side of her face.    HISTORY OF PRESENT ILLNESS: This is a 63 y.o. female complaining of pain and swelling to left side of mandible that started a couple days ago No other associated symptoms. Able to swallow.  Able to drink fluids and take medications Denies fever or chills. No other complaints or medical concerns today.  HPI   Prior to Admission medications   Medication Sig Start Date End Date Taking? Authorizing Provider  albuterol  (VENTOLIN  HFA) 108 (90 Base) MCG/ACT inhaler Inhale 2 puffs into the lungs every 6 (six) hours as needed for wheezing or shortness of breath. 08/10/23  Yes Young, Reggy D, MD  amLODipine  (NORVASC ) 5 MG tablet Take 1 tablet (5 mg total) by mouth daily. 11/14/23  Yes Purcell Emil Schanz, MD  BOOSTRIX 5-2.5-18.5 LF-MCG/0.5 injection  08/05/23  Yes [provider]  CAPVAXIVE 0.5 ML injection  08/05/23  Yes [provider]  Continuous Glucose Monitor DEVI Use as directed. 03/28/17  Yes Alfornia Madison, MD  Continuous Glucose Sensor (DEXCOM G7 SENSOR) MISC 1 Device by Does not apply route as directed. Change 10 days 02/16/24  Yes Shamleffer, Ibtehal Jaralla, MD  Cyanocobalamin (B-12 PO) Take 1 tablet by mouth daily.   Yes [provider]  dapagliflozin  propanediol (FARXIGA ) 10 MG TABS tablet Take 1 tablet (10 mg total) by mouth daily. 02/16/24  Yes Shamleffer, Ibtehal Jaralla, MD  diclofenac  Sodium (VOLTAREN ) 1 % GEL Apply 2 g topically 4 (four) times daily. Rub into affected area of foot 2 to 4 times daily 01/14/22  Yes Gershon Donnice SAUNDERS, DPM  dicyclomine  (BENTYL ) 10 MG capsule TAKE 1 CAPSULE BY MOUTH THREE TIMES DAILY BEFORE MEAL(S) 03/04/24  Yes Avram Lupita BRAVO, MD  esomeprazole  (NEXIUM ) 40 MG capsule TAKE ONE CAPSULE (40 MG TOTAL) BY MOUTH DAILY AT 9AM 01/25/24  Yes Avram Lupita BRAVO, MD  estradiol  (ESTRACE ) 0.1 MG/GM vaginal cream Place 1 Applicatorful vaginally at bedtime. 09/01/23  Yes Aima Mcwhirt, Emil Schanz, MD  famotidine  (PEPCID ) 20 MG tablet Take 1 tablet (20 mg total) by mouth 2 (two) times daily as needed. 02/04/22  Yes Esterwood, Amy S, PA-C  hyoscyamine  (ANASPAZ ) 0.125 MG TBDP disintergrating tablet Place 1 tablet (0.125 mg total) under the tongue every 6 (six) hours as needed for up to 20 doses. 09/07/23  Yes Showalter, Steffan BROCKS, MD  Insulin  Disposable Pump (OMNIPOD 5 DEXG7G6 PODS GEN 5) MISC Change pod every 2 days 02/16/24  Yes Shamleffer, Ibtehal Jaralla, MD  insulin  lispro (HUMALOG ) 100 UNIT/ML injection Max Daily 110 units per pump 02/16/24  Yes Shamleffer, Ibtehal Jaralla, MD  letrozole  (FEMARA ) 2.5 MG tablet Take 1 tablet (2.5 mg total) by mouth daily. 09/19/23  Yes Gudena, Vinay, MD  lidocaine  (LIDODERM ) 5 % Place 1 patch onto the skin daily. Remove & Discard patch within 12 hours or as directed by MD 10/22/22  Yes Laurice Maude BROCKS, MD  metFORMIN  (GLUCOPHAGE ) 1000 MG tablet Take 1 tablet (1,000 mg total) by mouth 2 (two) times daily with a meal. 02/16/24  Yes Shamleffer, Donell Cardinal, MD  Misc Natural Products (ELDERBERRY IMMUNE COMPLEX) CHEW Chew 1 tablet by mouth daily.   Yes [provider]  montelukast  (SINGULAIR ) 10 MG tablet TAKE ONE TABLET BY MOUTH DAILY AT 9PM AT BEDTIME 03/01/24  Yes Young, [place]  D, MD  Multiple Vitamin (MULTIVITAMIN WITH MINERALS) TABS tablet Take 1 tablet by mouth daily.   Yes [provider]  NARCAN 4 MG/0.1ML LIQD nasal spray kit Place 0.4 mg into the nose once. 12/06/18  Yes [provider]  oxyCODONE -acetaminophen  (PERCOCET) 10-325 MG tablet Take 1 tablet by mouth every 8 (eight) hours as needed for pain. 03/02/20  Yes [provider]  pregabalin  (LYRICA ) 150 MG capsule Take 150 mg by mouth 2 (two) times daily. 03/03/23  Yes [provider]  REPATHA  SURECLICK 140 MG/ML SOAJ INJECT 140MG   SUBCUTANEOUSLY EVERY 14 DAYS 02/27/24  Yes Jerimiah Wolman, Emil Schanz, MD  rosuvastatin  (CRESTOR ) 10 MG tablet Take 1 tablet (10 mg total) by mouth daily. 10/12/23  Yes Shamleffer, Ibtehal Jaralla, MD  SYMBICORT  160-4.5 MCG/ACT inhaler Inhale 2 puffs into the lungs 2 (two) times daily. 08/10/23  Yes Young, Reggy D, MD  tirzepatide  (MOUNJARO ) 10 MG/0.5ML Pen Inject 10 mg into the skin once a week. 02/16/24  Yes Shamleffer, Ibtehal Jaralla, MD  tiZANidine (ZANAFLEX) 4 MG tablet Take 4 mg by mouth 3 (three) times daily as needed for muscle spasms. 07/23/21  Yes [provider]  valACYclovir  (VALTREX ) 500 MG tablet TAKE 1 TABLET BY MOUTH TWICE DAILY FOR 5 DAYS AT  TIME  OF  OUTBREAK 09/01/23  Yes Keondria Siever, Emil Schanz, MD  valsartan  (DIOVAN ) 320 MG tablet Take 1 tablet (320 mg total) by mouth daily. 09/28/23  Yes Kyi Romanello, Emil Schanz, MD  Vitamin D , Ergocalciferol , (DRISDOL ) 1.25 MG (50000 UNIT) CAPS capsule Take 50,000 Units by mouth once a week. 08/21/23  Yes [provider]    Allergies  Allergen Reactions   Breo Ellipta  [Fluticasone  Furoate-Vilanterol] Other (See Comments)    Urinary retention   Atorvastatin  Other (See Comments)    Myalgia   Hydrochlorothiazide      Hypokalemia    Lisinopril Cough   Pravastatin     Simvastatin      Patient Active Problem List   Diagnosis Date Noted   Vaginal atrophy 06/09/2023   Cervical cancer screening 06/09/2023   Type 2 diabetes mellitus with hyperglycemia, with long-term current use of insulin  (HCC) 03/10/2023   Chronic pain of left ankle 02/14/2023   Lumbar degenerative disc disease 10/27/2022   Spinal stenosis of lumbar region without neurogenic claudication 10/27/2022   Osteoarthritis of spine with radiculopathy, lumbar region 10/27/2022   Neck pain on left side 09/28/2022   History of colonic polyps 05/19/2022   Benign neoplasm of ascending colon 05/19/2022   Benign neoplasm of transverse colon 05/19/2022   Malignant neoplasm of  upper-outer quadrant of right breast in female, estrogen receptor positive (HCC) 02/22/2021   Body mass index (BMI) 40.0-44.9, adult (HCC) 07/28/2020   Lumbago with sciatica, right side 07/28/2020   OSA on CPAP 10/31/2018   Stress incontinence in female 03/09/2017   Hepatic steatosis 08/29/2016   Diabetic neuropathy with neurologic complication (HCC) 10/20/2015   Long term current use of opiate analgesic 07/21/2015   COPD GOLD II  05/31/2013   Lung nodule < 6cm on CT 05/31/2013   Chronic cough 12/07/2012   Chronic pain syndrome 02/13/2012   Lumbar radiculopathy 11/29/2011   Type 2 diabetes mellitus with diabetic neuropathy (HCC) 08/25/2011   Hypertension associated with diabetes (HCC) 07/07/2009   GERD 01/08/2009   Dyslipidemia associated with type 2 diabetes mellitus (HCC) 12/09/2008   Hyperlipidemia 12/09/2008   Morbid obesity due to excess calories (HCC) 12/09/2008    Past Medical History:  Diagnosis Date  Allergy     Arthritis    back    Asthma    AS CHILD   Cancer (HCC)    breast CA- Right,   Chronic back pain    Chronic leg pain    due to back pain   Cocaine abuse (HCC)    in remission   COPD (chronic obstructive pulmonary disease) (HCC)    Diabetes (HCC)    with neuropathy   Dyspnea    GERD (gastroesophageal reflux disease)    Hyperlipidemia    Hypertension    Internal hemorrhoids    Intraductal papilloma of left breast 02/18/2016   Neuromuscular disorder (HCC)    neuropathy   Personal history of radiation therapy    Postlaminectomy syndrome of lumbar region 12/07/2011   RECTAL BLEEDING 12/09/2008   Annotation: s/p EGD 7/08 mild gastritis, s/p colonoscopy 7/08- benign polyp  s/p polypectomy and isolated diverticulum.  Qualifier: Diagnosis of  By: Ditzler RN, Debra     Sciatica    per patient    Sleep apnea    CPAP    YEARS AGO DONE 1/2 YEARS AGO AND WAS TOLD DID NOT HAVE   Tobacco abuse    Tubular adenoma of colon    Uterine fibroid    s/p hysterectomy     Past Surgical History:  Procedure Laterality Date   BACK SURGERY  2012   L5-S1 microendoscopic disectomy last surgery 06/2011   BREAST BIOPSY     LEFT    01/19/16   BREAST BIOPSY Right 02/16/2021   BREAST EXCISIONAL BIOPSY Left 02/2016   BREAST LUMPECTOMY Right 03/12/2021   BREAST LUMPECTOMY WITH RADIOACTIVE SEED AND SENTINEL LYMPH NODE BIOPSY Right 03/12/2021   Procedure: RIGHT BREAST LUMPECTOMY WITH RADIOACTIVE SEED AND SENTINEL LYMPH NODE BIOPSY;  Surgeon: Curvin Deward MOULD, MD;  Location: [facility] OR;  Service: General;  Laterality: Right;   BREAST REDUCTION SURGERY  1982   CATARACT EXTRACTION Bilateral 11/07/2019   COLONOSCOPY     COLONOSCOPY WITH PROPOFOL  N/A 05/19/2022   Procedure: COLONOSCOPY WITH PROPOFOL ;  Surgeon: Aneita Gwendlyn DASEN, MD;  Location: [facility];  Service: Gastroenterology;  Laterality: N/A;   CYSTOSCOPY WITH BIOPSY N/A 09/07/2023   Procedure: CYSTOSCOPY WITH BIOPSY AND BILATERAL RETROGRADE PYELOGRAM;  Surgeon: Shane Steffan BROCKS, MD;  Location: [facility];  Service: Urology;  Laterality: N/A;  30 MINUTE CASE   HAND SURGERY     MIDDLE TRIGGER FINGER RIGHT SIDE   POLYPECTOMY  05/19/2022   Procedure: POLYPECTOMY;  Surgeon: Aneita Gwendlyn DASEN, MD;  Location: [facility];  Service: Gastroenterology;;   RADIOACTIVE SEED GUIDED EXCISIONAL BREAST BIOPSY Left 02/18/2016   Procedure: LEFT RADIOACTIVE SEED GUIDED EXCISIONAL BREAST BIOPSY;  Surgeon: Alm Angle, MD;  Location: [facility] OR;  Service: General;  Laterality: Left;   REDUCTION MAMMAPLASTY Bilateral    TONSILLECTOMY  2008   TOTAL ABDOMINAL HYSTERECTOMY  05/24/2007   hysterectomy   TUBAL LIGATION     UPPER GASTROINTESTINAL ENDOSCOPY      Social History   Socioeconomic History   Marital status: Married    Spouse name: Curtistine   Number of children: 3   Years of education: Not on file   Highest education level: Some college, no degree  Occupational History    Comment: [employer]   Tobacco Use   Smoking status:  Former    Current packs/day: 0.00    Average packs/day: 0.5 packs/day for 20.0 years (10.0 ttl pk-yrs)    Types: Cigarettes    Start date: 07/23/1976  Quit date: 07/23/1996    Years since quitting: 27.7   Smokeless tobacco: Never  Vaping Use   Vaping status: Never Used  Substance and Sexual Activity   Alcohol  use: Not Currently    Comment: recovering addict clean for 9 years   Drug use: Not Currently    Types: Cocaine, Marijuana    Comment: recovering addict clean for 16 years   Sexual activity: Yes    Birth control/protection: Surgical  Other Topics Concern   Not on file  Social History Narrative   Current Social History 01/17/2020        Patient lives with spouse in a home which is 1 story. There are not steps up to the entrance the patient uses.       Patient's method of transportation is personal car.      The highest level of education was some college.      The patient currently works part-time.      Identified important Relationships are God, husband, kids, grandkids       Pets : None       Interests / Fun: Drawing and nature       Lives with husband-2025      Current Stressors: Pain, Covid       Religious / Personal Beliefs: Christian       Other: None    Social Drivers of Corporate investment banker Strain: Low Risk  (07/03/2023)   Overall Financial Resource Strain (CARDIA)    Difficulty of Paying Living Expenses: Not hard at all  Food Insecurity: No Food Insecurity (07/03/2023)   Hunger Vital Sign    Worried About Running Out of Food in the Last Year: Never true    Ran Out of Food in the Last Year: Never true  Transportation Needs: No Transportation Needs (07/03/2023)   PRAPARE - Administrator, Civil Service (Medical): No    Lack of Transportation (Non-Medical): No  Physical Activity: Insufficiently Active (07/03/2023)   Exercise Vital Sign    Days of Exercise per Week: 2 days    Minutes of Exercise per Session: 50 min  Stress: No Stress  Concern Present (07/03/2023)   [employer] of Occupational Health - Occupational Stress Questionnaire    Feeling of Stress : Only a little  Social Connections: Socially Integrated (07/03/2023)   Social Connection and Isolation Panel    Frequency of Communication with Friends and Family: More than three times a week    Frequency of Social Gatherings with Friends and Family: Never    Attends Religious Services: More than 4 times per year    Active Member of [employer] or Organizations: Yes    Attends Banker Meetings: Never    Marital Status: Married  Catering manager Violence: Not At Risk (07/03/2023)   Humiliation, Afraid, Rape, and Kick questionnaire    Fear of Current or Ex-Partner: No    Emotionally Abused: No    Physically Abused: No    Sexually Abused: No    Family History  Problem Relation Age of Onset   Diabetes Father    Heart disease Father    Hypertension Father    Diabetes Mother    Cancer Mother        brain   Hypertension Mother    Kidney disease Sister    Diabetes Sister    Kidney cancer Sister    Stroke Sister    Diabetes Sister    Hypertension Sister  Diabetes Sister    Hypertension Sister    Obesity Son    Colon cancer Neg Hx    Colon polyps Neg Hx    Esophageal cancer Neg Hx    Rectal cancer Neg Hx    Stomach cancer Neg Hx      Review of Systems  Constitutional: Negative.  Negative for chills and fever.  HENT: Negative.  Negative for congestion and sore throat.   Respiratory: Negative.  Negative for cough and shortness of breath.   Cardiovascular: Negative.  Negative for chest pain and palpitations.  Gastrointestinal:  Negative for abdominal pain, diarrhea, nausea and vomiting.  Genitourinary: Negative.  Negative for dysuria and hematuria.  Skin: Negative.  Negative for rash.  Neurological: Negative.  Negative for dizziness and headaches.  All other systems reviewed and are negative.   Vitals:   04/02/24 1357 04/02/24 1405  BP:  (!) 140/90 138/80  Pulse: 94   Temp: 100.2 F (37.9 C)   SpO2: 96%     Physical Exam Vitals reviewed.  Constitutional:      Appearance: Normal appearance.  HENT:     Head: Normocephalic.     Left Ear: Tympanic membrane, ear canal and external ear normal.     Mouth/Throat:     Mouth: Mucous membranes are moist.     Pharynx: Oropharynx is clear.     Comments: No findings of Ludwig's angina Eyes:     Extraocular Movements: Extraocular movements intact.     Pupils: Pupils are equal, round, and reactive to light.  Neck:     Comments: No findings of Ludwig's angina Cardiovascular:     Rate and Rhythm: Normal rate and regular rhythm.     Pulses: Normal pulses.     Heart sounds: Normal heart sounds.  Pulmonary:     Effort: Pulmonary effort is normal.     Breath sounds: Normal breath sounds.  Musculoskeletal:     Cervical back: No tenderness.  Lymphadenopathy:     Head:     Left side of head: Submandibular adenopathy present.     Cervical: No cervical adenopathy.  Skin:    General: Skin is warm and dry.     Capillary Refill: Capillary refill takes less than 2 seconds.  Neurological:     General: No focal deficit present.     Mental Status: She is alert and oriented to person, place, and time.  Psychiatric:        Mood and Affect: Mood normal.        Behavior: Behavior normal.      ASSESSMENT & PLAN: Problem List Items Addressed This Visit       Digestive   Periodontal abscess - Primary   Clinically stable.  No signs of Ludwig's angina Recommend to start Augmentin 875 twice a day for at least 7 days Needs evaluation by oral surgeon.  Referral placed today Pain management discussed Advised to use Tylenol  for mild to moderate pain and tramadol  for moderate to severe pain ED precautions given      Relevant Medications   amoxicillin-clavulanate (AUGMENTIN) 875-125 MG tablet   Other Relevant Orders   Ambulatory referral to Oral Maxillofacial Surgery     Other    Pain, dental   Relevant Medications   traMADol  (ULTRAM ) 50 MG tablet   Patient Instructions  Dental Abscess  A dental abscess is an area of pus in or around a tooth. It comes from an infection. It can cause pain and other symptoms. Treatment  will help with symptoms and prevent the infection from spreading. What are the causes? This condition is caused by an infection in or around the tooth. This can be from: Very bad tooth decay (cavities). A bad injury to the tooth, such as a broken or chipped tooth. What increases the risk? The risk to get an abscess is higher in males. It is also more likely in people who: Have dental decay. Have very bad gum disease. Eat sugary snacks between meals. Use tobacco. Have diabetes. Have a weak disease-fighting system (immune system). Do not brush their teeth regularly. What are the signs or symptoms? Some mild symptoms are: Tenderness. Bad breath. Fever. A sharp, sour taste in the mouth. Pain in and around the infected tooth. Worse symptoms of this condition include: Swollen neck glands. Chills. Pus draining around the tooth. Swelling and redness around the tooth, the mouth, or the face. Very bad pain in and around the tooth. The worst symptoms can include: Difficulty swallowing. Difficulty opening your mouth. Feeling like you may vomit or vomiting. How is this treated? This is treated by getting rid of the infection. Your dentist will discuss ways to do this, including: Antibiotic medicines. Antibacterial mouth rinse. An incision in the abscess to drain out the pus. A root canal. Removing the tooth. Follow these instructions at home: Medicines Take over-the-counter and prescription medicines only as told by your dentist. If you were prescribed an antibiotic medicine, take it as told by your dentist. Do not stop taking it even if you start to feel better. If you were prescribed a gel that has numbing medicine in it, use it exactly as  told. Ask your dentist if you should avoid driving or using machines while you are taking your medicine. General instructions Rinse your mouth often with salt water . To make salt water , dissolve -1 tsp (3-6 g) of salt in 1 cup (237 mL) of warm water . Eat a soft diet while your mouth is healing. Drink enough fluid to keep your pee (urine) pale yellow. Do not apply heat to the outside of your mouth. Do not smoke or use any products that contain nicotine or tobacco. If you need help quitting, ask your dentist. Keep all follow-up visits. Prevent an abscess Brush your teeth every morning and every night. Use fluoride toothpaste. Floss your teeth each day. Get dental cleanings as often as told by your dentist. Think about getting dental sealant put on teeth that have deep holes (decay). Drink water  that has fluoride in it. Most tap water  has fluoride. Check the label on bottled water  to see if it has fluoride in it. Drink water  instead of sugary drinks. Eat healthy meals and snacks. Wear a mouth guard or face shield when you play sports. Contact a doctor if: Your pain is worse and medicine does not help. Get help right away if: You have a fever or chills. Your symptoms suddenly get worse. You have a very bad headache. You have problems breathing or swallowing. You have trouble opening your mouth. You have swelling in your neck or close to your eye. These symptoms may be an emergency. Get help right away. Call your local emergency services (911 in the [place] Do not wait to see if the symptoms will go away. Do not drive yourself to the hospital. Summary A dental abscess is an area of pus in or around a tooth. It is caused by an infection. Treatment will help with symptoms and prevent the infection from spreading. Take over-the-counter  and prescription medicines only as told by your dentist. To prevent an abscess, take good care of your teeth. Brush your teeth every morning and night. Use  floss every day. Get dental cleanings as often as told by your dentist. This information is not intended to replace advice given to you by your health care provider. Make sure you discuss any questions you have with your health care provider. Document Revised: 08/05/2020 Document Reviewed: 08/06/2020 Elsevier Patient Education  2024 [employer]     Emil Schaumann, MD [facility]

## 2024-04-02 NOTE — Assessment & Plan Note (Signed)
 Clinically stable.  No signs of Ludwig's angina Recommend to start Augmentin 875 twice a day for at least 7 days Needs evaluation by oral surgeon.  Referral placed today Pain management discussed Advised to use Tylenol  for mild to moderate pain and tramadol  for moderate to severe pain ED precautions given

## 2024-04-05 DIAGNOSIS — G4733 Obstructive sleep apnea (adult) (pediatric): Secondary | ICD-10-CM | POA: Diagnosis not present

## 2024-04-09 ENCOUNTER — Ambulatory Visit: Payer: Self-pay | Admitting: Emergency Medicine

## 2024-04-09 ENCOUNTER — Other Ambulatory Visit: Payer: Self-pay | Admitting: Emergency Medicine

## 2024-04-09 DIAGNOSIS — E1169 Type 2 diabetes mellitus with other specified complication: Secondary | ICD-10-CM

## 2024-04-09 NOTE — Telephone Encounter (Signed)
 FYI Only or Action Required?: FYI only for provider.  Patient was last seen in primary care on 04/02/2024 by Purcell Emil Schanz, MD.  Called Nurse Triage reporting Facial Swelling.  Symptoms began several days ago.  Triage Disposition: See Physician Within 24 Hours (overriding See PCP Within 2 Weeks)  Patient/caregiver understands and will follow disposition?: Yes           Copied from CRM [id number]. Topic: Clinical - Medication Question >> Apr 09, 2024  2:53 PM Viola F wrote: Patient was seen 04/02/24, she's still has swelling under chin on left side. Says she finished the amoxicillin-clavulanate (AUGMENTIN) 875-125 MG tablet yesterday and now has knot in chin area. She says the medication also caused her vaginal area to become raw Reason for Disposition  All other urine symptoms  Answer Assessment - Initial Assessment Questions This RN scheduled pt an appointment tomorrow in office. This RN educated pt on new-worsening symptoms and when to call back. Pt verbalized understanding and agrees to plan.     Pt states she still has the knot under left chin; it has gotten smaller and improved but still there Pt finished antibiotic yesterday Denies pain   Vaginal rawness explained as pain Pain level: 7-8/10 pain level Denies itching  Protocols used: Face Swelling-A-AH, Urinary Symptoms-A-AH

## 2024-04-10 ENCOUNTER — Ambulatory Visit (INDEPENDENT_AMBULATORY_CARE_PROVIDER_SITE_OTHER): Admitting: Emergency Medicine

## 2024-04-10 ENCOUNTER — Encounter: Payer: Self-pay | Admitting: Emergency Medicine

## 2024-04-10 VITALS — BP 140/86 | HR 72 | Temp 98.6°F | Ht 64.0 in | Wt 246.0 lb

## 2024-04-10 DIAGNOSIS — K05219 Aggressive periodontitis, localized, unspecified severity: Secondary | ICD-10-CM

## 2024-04-10 DIAGNOSIS — B3731 Acute candidiasis of vulva and vagina: Secondary | ICD-10-CM

## 2024-04-10 MED ORDER — FLUCONAZOLE 150 MG PO TABS: 150.0000 mg | ORAL_TABLET | Freq: Every day | ORAL | 0 refills | Status: DC

## 2024-04-10 MED ORDER — AMOXICILLIN-POT CLAVULANATE 875-125 MG PO TABS: 1.0000 | ORAL_TABLET | Freq: Two times a day (BID) | ORAL | 0 refills | Status: AC

## 2024-04-10 NOTE — Progress Notes (Signed)
 [name] 63 y.o.   Chief Complaint  Patient presents with   Medical Management of Chronic Issues    It  may be yeast infection started after taking antibiotic ,noticed the knots after taking the antibiotic under chin from the previous abscess     HISTORY OF PRESENT ILLNESS: This is a 63 y.o. female here for follow-up of office visit on 04/02/2024 when she presented with periodontal abscess Area under left mandible much improved but still has residual swelling and small lymphadenopathy She was reached by schedulers I was able to schedule appointment with dental surgeon as discussed Just finished course of Augmentin yesterday.  Now complaining of vaginal irritation No other complaint or medical concerns today.   HPI   Prior to Admission medications   Medication Sig Start Date End Date Taking? Authorizing Provider  albuterol  (VENTOLIN  HFA) 108 (90 Base) MCG/ACT inhaler Inhale 2 puffs into the lungs every 6 (six) hours as needed for wheezing or shortness of breath. 08/10/23  Yes Young, Reggy D, MD  amLODipine  (NORVASC ) 5 MG tablet Take 1 tablet (5 mg total) by mouth daily. 11/14/23  Yes Purcell Emil Schanz, MD  BOOSTRIX 5-2.5-18.5 LF-MCG/0.5 injection  08/05/23  Yes [provider]  CAPVAXIVE 0.5 ML injection  08/05/23  Yes [provider]  Continuous Glucose Monitor DEVI Use as directed. 03/28/17  Yes Alfornia Madison, MD  Continuous Glucose Sensor (DEXCOM G7 SENSOR) MISC 1 Device by Does not apply route as directed. Change 10 days 02/16/24  Yes Shamleffer, Ibtehal Jaralla, MD  Cyanocobalamin (B-12 PO) Take 1 tablet by mouth daily.   Yes [provider]  dapagliflozin  propanediol (FARXIGA ) 10 MG TABS tablet Take 1 tablet (10 mg total) by mouth daily. 02/16/24  Yes Shamleffer, Ibtehal Jaralla, MD  diclofenac  Sodium (VOLTAREN ) 1 % GEL Apply 2 g topically 4 (four) times daily. Rub into affected area of foot 2 to 4 times daily 01/14/22  Yes Gershon Donnice SAUNDERS, DPM   dicyclomine  (BENTYL ) 10 MG capsule TAKE 1 CAPSULE BY MOUTH THREE TIMES DAILY BEFORE MEAL(S) 03/04/24  Yes Avram Lupita BRAVO, MD  esomeprazole  (NEXIUM ) 40 MG capsule TAKE ONE CAPSULE (40 MG TOTAL) BY MOUTH DAILY AT 9AM 01/25/24  Yes Avram Lupita BRAVO, MD  estradiol  (ESTRACE ) 0.1 MG/GM vaginal cream Place 1 Applicatorful vaginally at bedtime. 09/01/23  Yes Jaston Havens, Emil Schanz, MD  famotidine  (PEPCID ) 20 MG tablet Take 1 tablet (20 mg total) by mouth 2 (two) times daily as needed. 02/04/22  Yes Esterwood, Amy S, PA-C  hyoscyamine  (ANASPAZ ) 0.125 MG TBDP disintergrating tablet Place 1 tablet (0.125 mg total) under the tongue every 6 (six) hours as needed for up to 20 doses. 09/07/23  Yes Shane Steffan BROCKS, MD  Insulin  Disposable Pump (OMNIPOD 5 DEXG7G6 PODS GEN 5) MISC Change pod every 2 days 02/16/24  Yes Shamleffer, Ibtehal Jaralla, MD  insulin  lispro (HUMALOG ) 100 UNIT/ML injection Max Daily 110 units per pump 02/16/24  Yes Shamleffer, Ibtehal Jaralla, MD  letrozole  (FEMARA ) 2.5 MG tablet Take 1 tablet (2.5 mg total) by mouth daily. 09/19/23  Yes Odean Potts, MD  lidocaine  (LIDODERM ) 5 % Place 1 patch onto the skin daily. Remove & Discard patch within 12 hours or as directed by MD 10/22/22  Yes Laurice Maude BROCKS, MD  metFORMIN  (GLUCOPHAGE ) 1000 MG tablet Take 1 tablet (1,000 mg total) by mouth 2 (two) times daily with a meal. 02/16/24  Yes Shamleffer, Donell Cardinal, MD  Misc Natural Products (ELDERBERRY IMMUNE COMPLEX) CHEW Chew 1  tablet by mouth daily.   Yes [provider]  montelukast  (SINGULAIR ) 10 MG tablet TAKE ONE TABLET BY MOUTH DAILY AT 9PM AT BEDTIME 03/01/24  Yes Young, Reggy D, MD  Multiple Vitamin (MULTIVITAMIN WITH MINERALS) TABS tablet Take 1 tablet by mouth daily.   Yes [provider]  NARCAN 4 MG/0.1ML LIQD nasal spray kit Place 0.4 mg into the nose once. 12/06/18  Yes [provider]  pregabalin  (LYRICA ) 150 MG capsule Take 150 mg by mouth 2 (two) times daily.  03/03/23  Yes [provider]  REPATHA  SURECLICK 140 MG/ML SOAJ INJECT (140MG ) SUBCUTANEOUSLY EVERY 14 DAYS 04/09/24  Yes Kimya Mccahill, Emil Schanz, MD  rosuvastatin  (CRESTOR ) 10 MG tablet Take 1 tablet (10 mg total) by mouth daily. 10/12/23  Yes Shamleffer, Ibtehal Jaralla, MD  SYMBICORT  160-4.5 MCG/ACT inhaler Inhale 2 puffs into the lungs 2 (two) times daily. 08/10/23  Yes Young, Reggy D, MD  tirzepatide  (MOUNJARO ) 10 MG/0.5ML Pen Inject 10 mg into the skin once a week. 02/16/24  Yes Shamleffer, Ibtehal Jaralla, MD  tiZANidine (ZANAFLEX) 4 MG tablet Take 4 mg by mouth 3 (three) times daily as needed for muscle spasms. 07/23/21  Yes [provider]  valACYclovir  (VALTREX ) 500 MG tablet TAKE 1 TABLET BY MOUTH TWICE DAILY FOR 5 DAYS AT  TIME  OF  OUTBREAK 09/01/23  Yes Sheretha Shadd, Emil Schanz, MD  valsartan  (DIOVAN ) 320 MG tablet Take 1 tablet (320 mg total) by mouth daily. 09/28/23  Yes Selma Rodelo, Emil Schanz, MD  Vitamin D , Ergocalciferol , (DRISDOL ) 1.25 MG (50000 UNIT) CAPS capsule Take 50,000 Units by mouth once a week. 08/21/23  Yes [provider]    Allergies  Allergen Reactions   Breo Ellipta  [Fluticasone  Furoate-Vilanterol] Other (See Comments)    Urinary retention   Atorvastatin  Other (See Comments)    Myalgia   Hydrochlorothiazide      Hypokalemia    Lisinopril Cough   Pravastatin     Simvastatin      Patient Active Problem List   Diagnosis Date Noted   Pain, dental 04/02/2024   Periodontal abscess 04/02/2024   Vaginal atrophy 06/09/2023   Cervical cancer screening 06/09/2023   Type 2 diabetes mellitus with hyperglycemia, with long-term current use of insulin  (HCC) 03/10/2023   Chronic pain of left ankle 02/14/2023   Lumbar degenerative disc disease 10/27/2022   Spinal stenosis of lumbar region without neurogenic claudication 10/27/2022   Osteoarthritis of spine with radiculopathy, lumbar region 10/27/2022   Neck pain on left side 09/28/2022   History of  colonic polyps 05/19/2022   Benign neoplasm of ascending colon 05/19/2022   Benign neoplasm of transverse colon 05/19/2022   Malignant neoplasm of upper-outer quadrant of right breast in female, estrogen receptor positive (HCC) 02/22/2021   Body mass index (BMI) 40.0-44.9, adult (HCC) 07/28/2020   Lumbago with sciatica, right side 07/28/2020   OSA on CPAP 10/31/2018   Stress incontinence in female 03/09/2017   Hepatic steatosis 08/29/2016   Diabetic neuropathy with neurologic complication (HCC) 10/20/2015   Long term current use of opiate analgesic 07/21/2015   COPD GOLD II  05/31/2013   Lung nodule < 6cm on CT 05/31/2013   Chronic cough 12/07/2012   Chronic pain syndrome 02/13/2012   Lumbar radiculopathy 11/29/2011   Type 2 diabetes mellitus with diabetic neuropathy (HCC) 08/25/2011   Hypertension associated with diabetes (HCC) 07/07/2009   GERD 01/08/2009   Dyslipidemia associated with type 2 diabetes mellitus (HCC) 12/09/2008   Hyperlipidemia 12/09/2008   Morbid obesity due  to excess calories (HCC) 12/09/2008    Past Medical History:  Diagnosis Date   Allergy     Arthritis    back    Asthma    AS CHILD   Cancer (HCC)    breast CA- Right,   Chronic back pain    Chronic leg pain    due to back pain   Cocaine abuse (HCC)    in remission   COPD (chronic obstructive pulmonary disease) (HCC)    Diabetes (HCC)    with neuropathy   Dyspnea    GERD (gastroesophageal reflux disease)    Hyperlipidemia    Hypertension    Internal hemorrhoids    Intraductal papilloma of left breast 02/18/2016   Neuromuscular disorder (HCC)    neuropathy   Personal history of radiation therapy    Postlaminectomy syndrome of lumbar region 12/07/2011   RECTAL BLEEDING 12/09/2008   Annotation: s/p EGD 7/08 mild gastritis, s/p colonoscopy 7/08- benign polyp  s/p polypectomy and isolated diverticulum.  Qualifier: Diagnosis of  By: Ditzler RN, Debra     Sciatica    per patient    Sleep apnea     CPAP    YEARS AGO DONE 1/2 YEARS AGO AND WAS TOLD DID NOT HAVE   Tobacco abuse    Tubular adenoma of colon    Uterine fibroid    s/p hysterectomy    Past Surgical History:  Procedure Laterality Date   BACK SURGERY  2012   L5-S1 microendoscopic disectomy last surgery 06/2011   BREAST BIOPSY     LEFT    01/19/16   BREAST BIOPSY Right 02/16/2021   BREAST EXCISIONAL BIOPSY Left 02/2016   BREAST LUMPECTOMY Right 03/12/2021   BREAST LUMPECTOMY WITH RADIOACTIVE SEED AND SENTINEL LYMPH NODE BIOPSY Right 03/12/2021   Procedure: RIGHT BREAST LUMPECTOMY WITH RADIOACTIVE SEED AND SENTINEL LYMPH NODE BIOPSY;  Surgeon: Curvin Deward MOULD, MD;  Location: [facility] OR;  Service: General;  Laterality: Right;   BREAST REDUCTION SURGERY  1982   CATARACT EXTRACTION Bilateral 11/07/2019   COLONOSCOPY     COLONOSCOPY WITH PROPOFOL  N/A 05/19/2022   Procedure: COLONOSCOPY WITH PROPOFOL ;  Surgeon: Aneita Gwendlyn DASEN, MD;  Location: [facility];  Service: Gastroenterology;  Laterality: N/A;   CYSTOSCOPY WITH BIOPSY N/A 09/07/2023   Procedure: CYSTOSCOPY WITH BIOPSY AND BILATERAL RETROGRADE PYELOGRAM;  Surgeon: Shane Steffan BROCKS, MD;  Location: [facility];  Service: Urology;  Laterality: N/A;  30 MINUTE CASE   HAND SURGERY     MIDDLE TRIGGER FINGER RIGHT SIDE   POLYPECTOMY  05/19/2022   Procedure: POLYPECTOMY;  Surgeon: Aneita Gwendlyn DASEN, MD;  Location: [facility];  Service: Gastroenterology;;   RADIOACTIVE SEED GUIDED EXCISIONAL BREAST BIOPSY Left 02/18/2016   Procedure: LEFT RADIOACTIVE SEED GUIDED EXCISIONAL BREAST BIOPSY;  Surgeon: Alm Angle, MD;  Location: [facility];  Service: General;  Laterality: Left;   REDUCTION MAMMAPLASTY Bilateral    TONSILLECTOMY  2008   TOTAL ABDOMINAL HYSTERECTOMY  05/24/2007   hysterectomy   TUBAL LIGATION     UPPER GASTROINTESTINAL ENDOSCOPY      Social History   Socioeconomic History   Marital status: Married    Spouse name: Curtistine   Number of children: 3   Years of education: Not  on file   Highest education level: Some college, no degree  Occupational History    Comment: [employer] [place]  Tobacco Use   Smoking status: Former    Current packs/day: 0.00    Average packs/day: 0.5 packs/day for  20.0 years (10.0 ttl pk-yrs)    Types: Cigarettes    Start date: 07/23/1976    Quit date: 07/23/1996    Years since quitting: 27.7   Smokeless tobacco: Never  Vaping Use   Vaping status: Never Used  Substance and Sexual Activity   Alcohol  use: Not Currently    Comment: recovering addict clean for 9 years   Drug use: Not Currently    Types: Cocaine, Marijuana    Comment: recovering addict clean for 16 years   Sexual activity: Yes    Birth control/protection: Surgical  Other Topics Concern   Not on file  Social History Narrative   Current Social History 01/17/2020        Patient lives with spouse in a home which is 1 story. There are not steps up to the entrance the patient uses.       Patient's method of transportation is personal car.      The highest level of education was some college.      The patient currently works part-time.      Identified important Relationships are God, husband, kids, grandkids       Pets : None       Interests / Fun: Drawing and nature       Lives with husband-2025      Current Stressors: Pain, Covid       Religious / Personal Beliefs: Christian       Other: None    Social Drivers of Corporate Investment Banker Strain: Low Risk  (07/03/2023)   Overall Financial Resource Strain (CARDIA)    Difficulty of Paying Living Expenses: Not hard at all  Food Insecurity: No Food Insecurity (07/03/2023)   Hunger Vital Sign    Worried About Running Out of Food in the Last Year: Never true    Ran Out of Food in the Last Year: Never true  Transportation Needs: No Transportation Needs (07/03/2023)   PRAPARE - Administrator, Civil Service (Medical): No    Lack of Transportation (Non-Medical): No  Physical Activity:  Insufficiently Active (07/03/2023)   Exercise Vital Sign    Days of Exercise per Week: 2 days    Minutes of Exercise per Session: 50 min  Stress: No Stress Concern Present (07/03/2023)   [employer] of Occupational Health - Occupational Stress Questionnaire    Feeling of Stress : Only a little  Social Connections: Socially Integrated (07/03/2023)   Social Connection and Isolation Panel    Frequency of Communication with Friends and Family: More than three times a week    Frequency of Social Gatherings with Friends and Family: Never    Attends Religious Services: More than 4 times per year    Active Member of [employer] or Organizations: Yes    Attends Banker Meetings: Never    Marital Status: Married  Catering Manager Violence: Not At Risk (07/03/2023)   Humiliation, Afraid, Rape, and Kick questionnaire    Fear of Current or Ex-Partner: No    Emotionally Abused: No    Physically Abused: No    Sexually Abused: No    Family History  Problem Relation Age of Onset   Diabetes Father    Heart disease Father    Hypertension Father    Diabetes Mother    Cancer Mother        brain   Hypertension Mother    Kidney disease Sister    Diabetes Sister    Kidney cancer  Sister    Stroke Sister    Diabetes Sister    Hypertension Sister    Diabetes Sister    Hypertension Sister    Obesity Son    Colon cancer Neg Hx    Colon polyps Neg Hx    Esophageal cancer Neg Hx    Rectal cancer Neg Hx    Stomach cancer Neg Hx      Review of Systems  Constitutional: Negative.  Negative for chills and fever.  HENT: Negative.  Negative for congestion and sore throat.   Respiratory: Negative.  Negative for cough and shortness of breath.   Cardiovascular: Negative.  Negative for chest pain and palpitations.  Gastrointestinal:  Negative for abdominal pain, diarrhea, nausea and vomiting.  Genitourinary:  Negative for dysuria and hematuria.  Skin: Negative.  Negative for rash.   Neurological: Negative.  Negative for dizziness and headaches.  All other systems reviewed and are negative.   Today's Vitals   04/10/24 1415  BP: (!) 140/86  Pulse: 72  Temp: 98.6 F (37 C)  TempSrc: Oral  SpO2: 99%  Weight: 246 lb (111.6 kg)  Height: 5' 4 (1.626 m)   Body mass index is 42.23 kg/m.   Physical Exam Vitals reviewed.  Constitutional:      Appearance: Normal appearance.  HENT:     Head: Normocephalic.     Mouth/Throat:     Mouth: Mucous membranes are moist.     Pharynx: Oropharynx is clear.     Comments: Submandibular adenopathy much improved.  Still has tender smaller lump still compatible with abscess No findings of Ludwig's angina Cardiovascular:     Rate and Rhythm: Normal rate.  Pulmonary:     Effort: Pulmonary effort is normal.  Skin:    General: Skin is warm and dry.  Neurological:     Mental Status: She is alert and oriented to person, place, and time.  Psychiatric:        Mood and Affect: Mood normal.        Behavior: Behavior normal.      ASSESSMENT & PLAN: Problem List Items Addressed This Visit       Digestive   Periodontal abscess - Primary   Much improved today Scheduled to see dental surgeon this week Recommend to continue Augmentin to finish 10-day course Advised to stay well-hydrated and contact the office if no better or worse during the next several days      Relevant Medications   amoxicillin-clavulanate (AUGMENTIN) 875-125 MG tablet     Genitourinary   Yeast vaginitis   Secondary to recent antibiotic use Recommend Diflucan  150 mg daily for 3 days      Relevant Medications   fluconazole  (DIFLUCAN ) 150 MG tablet   Patient Instructions  Dental Abscess  A dental abscess is an area of pus in or around a tooth. It comes from an infection. It can cause pain and other symptoms. Treatment will help with symptoms and prevent the infection from spreading. What are the causes? This condition is caused by an infection  in or around the tooth. This can be from: Very bad tooth decay (cavities). A bad injury to the tooth, such as a broken or chipped tooth. What increases the risk? The risk to get an abscess is higher in males. It is also more likely in people who: Have dental decay. Have very bad gum disease. Eat sugary snacks between meals. Use tobacco. Have diabetes. Have a weak disease-fighting system (immune system). Do not brush  their teeth regularly. What are the signs or symptoms? Some mild symptoms are: Tenderness. Bad breath. Fever. A sharp, sour taste in the mouth. Pain in and around the infected tooth. Worse symptoms of this condition include: Swollen neck glands. Chills. Pus draining around the tooth. Swelling and redness around the tooth, the mouth, or the face. Very bad pain in and around the tooth. The worst symptoms can include: Difficulty swallowing. Difficulty opening your mouth. Feeling like you may vomit or vomiting. How is this treated? This is treated by getting rid of the infection. Your dentist will discuss ways to do this, including: Antibiotic medicines. Antibacterial mouth rinse. An incision in the abscess to drain out the pus. A root canal. Removing the tooth. Follow these instructions at home: Medicines Take over-the-counter and prescription medicines only as told by your dentist. If you were prescribed an antibiotic medicine, take it as told by your dentist. Do not stop taking it even if you start to feel better. If you were prescribed a gel that has numbing medicine in it, use it exactly as told. Ask your dentist if you should avoid driving or using machines while you are taking your medicine. General instructions Rinse your mouth often with salt water . To make salt water , dissolve -1 tsp (3-6 g) of salt in 1 cup (237 mL) of warm water . Eat a soft diet while your mouth is healing. Drink enough fluid to keep your pee (urine) pale yellow. Do not apply heat to  the outside of your mouth. Do not smoke or use any products that contain nicotine or tobacco. If you need help quitting, ask your dentist. Keep all follow-up visits. Prevent an abscess Brush your teeth every morning and every night. Use fluoride toothpaste. Floss your teeth each day. Get dental cleanings as often as told by your dentist. Think about getting dental sealant put on teeth that have deep holes (decay). Drink water  that has fluoride in it. Most tap water  has fluoride. Check the label on bottled water  to see if it has fluoride in it. Drink water  instead of sugary drinks. Eat healthy meals and snacks. Wear a mouth guard or face shield when you play sports. Contact a doctor if: Your pain is worse and medicine does not help. Get help right away if: You have a fever or chills. Your symptoms suddenly get worse. You have a very bad headache. You have problems breathing or swallowing. You have trouble opening your mouth. You have swelling in your neck or close to your eye. These symptoms may be an emergency. Get help right away. Call your local emergency services (911 in the [place] Do not wait to see if the symptoms will go away. Do not drive yourself to the hospital. Summary A dental abscess is an area of pus in or around a tooth. It is caused by an infection. Treatment will help with symptoms and prevent the infection from spreading. Take over-the-counter and prescription medicines only as told by your dentist. To prevent an abscess, take good care of your teeth. Brush your teeth every morning and night. Use floss every day. Get dental cleanings as often as told by your dentist. This information is not intended to replace advice given to you by your health care provider. Make sure you discuss any questions you have with your health care provider. Document Revised: 08/05/2020 Document Reviewed: 08/06/2020 Elsevier Patient Education  2024 Elsevier Inc.    Emil Schaumann,  MD [facility]

## 2024-04-10 NOTE — Patient Instructions (Signed)
 Dental Abscess  A dental abscess is an area of pus in or around a tooth. It comes from an infection. It can cause pain and other symptoms. Treatment will help with symptoms and prevent the infection from spreading. What are the causes? This condition is caused by an infection in or around the tooth. This can be from: Very bad tooth decay (cavities). A bad injury to the tooth, such as a broken or chipped tooth. What increases the risk? The risk to get an abscess is higher in males. It is also more likely in people who: Have dental decay. Have very bad gum disease. Eat sugary snacks between meals. Use tobacco. Have diabetes. Have a weak disease-fighting system (immune system). Do not brush their teeth regularly. What are the signs or symptoms? Some mild symptoms are: Tenderness. Bad breath. Fever. A sharp, sour taste in the mouth. Pain in and around the infected tooth. Worse symptoms of this condition include: Swollen neck glands. Chills. Pus draining around the tooth. Swelling and redness around the tooth, the mouth, or the face. Very bad pain in and around the tooth. The worst symptoms can include: Difficulty swallowing. Difficulty opening your mouth. Feeling like you may vomit or vomiting. How is this treated? This is treated by getting rid of the infection. Your dentist will discuss ways to do this, including: Antibiotic medicines. Antibacterial mouth rinse. An incision in the abscess to drain out the pus. A root canal. Removing the tooth. Follow these instructions at home: Medicines Take over-the-counter and prescription medicines only as told by your dentist. If you were prescribed an antibiotic medicine, take it as told by your dentist. Do not stop taking it even if you start to feel better. If you were prescribed a gel that has numbing medicine in it, use it exactly as told. Ask your dentist if you should avoid driving or using machines while you are taking your  medicine. General instructions Rinse your mouth often with salt water. To make salt water, dissolve -1 tsp (3-6 g) of salt in 1 cup (237 mL) of warm water. Eat a soft diet while your mouth is healing. Drink enough fluid to keep your pee (urine) pale yellow. Do not apply heat to the outside of your mouth. Do not smoke or use any products that contain nicotine or tobacco. If you need help quitting, ask your dentist. Keep all follow-up visits. Prevent an abscess Brush your teeth every morning and every night. Use fluoride toothpaste. Floss your teeth each day. Get dental cleanings as often as told by your dentist. Think about getting dental sealant put on teeth that have deep holes (decay). Drink water that has fluoride in it. Most tap water has fluoride. Check the label on bottled water to see if it has fluoride in it. Drink water instead of sugary drinks. Eat healthy meals and snacks. Wear a mouth guard or face shield when you play sports. Contact a doctor if: Your pain is worse and medicine does not help. Get help right away if: You have a fever or chills. Your symptoms suddenly get worse. You have a very bad headache. You have problems breathing or swallowing. You have trouble opening your mouth. You have swelling in your neck or close to your eye. These symptoms may be an emergency. Get help right away. Call your local emergency services (911 in the [place] Do not wait to see if the symptoms will go away. Do not drive yourself to the hospital. Summary A dental abscess  is an area of pus in or around a tooth. It is caused by an infection. Treatment will help with symptoms and prevent the infection from spreading. Take over-the-counter and prescription medicines only as told by your dentist. To prevent an abscess, take good care of your teeth. Brush your teeth every morning and night. Use floss every day. Get dental cleanings as often as told by your dentist. This information is  not intended to replace advice given to you by your health care provider. Make sure you discuss any questions you have with your health care provider. Document Revised: 08/05/2020 Document Reviewed: 08/06/2020 Elsevier Patient Education  2024 [employer].

## 2024-04-10 NOTE — Assessment & Plan Note (Signed)
 Secondary to recent antibiotic use Recommend Diflucan  150 mg daily for 3 days

## 2024-04-10 NOTE — Assessment & Plan Note (Signed)
 Much improved today Scheduled to see dental surgeon this week Recommend to continue Augmentin to finish 10-day course Advised to stay well-hydrated and contact the office if no better or worse during the next several days

## 2024-04-15 ENCOUNTER — Other Ambulatory Visit: Payer: Self-pay | Admitting: Internal Medicine

## 2024-04-15 ENCOUNTER — Other Ambulatory Visit: Payer: Self-pay | Admitting: Emergency Medicine

## 2024-04-15 DIAGNOSIS — B3731 Acute candidiasis of vulva and vagina: Secondary | ICD-10-CM

## 2024-04-25 ENCOUNTER — Other Ambulatory Visit (HOSPITAL_COMMUNITY): Payer: Self-pay

## 2024-04-25 ENCOUNTER — Telehealth: Payer: Self-pay

## 2024-04-25 NOTE — Telephone Encounter (Signed)
 Pharmacy Patient Advocate Encounter   Received notification from Onbase that prior authorization for Repatha  SureClick 140MG /ML auto-injectors  is required/requested.   Insurance verification completed.   The patient is insured through [facility].   Per test claim: PA required; PA submitted to above mentioned insurance via Latent Key/confirmation #/EOC [id number] Status is pending

## 2024-04-26 ENCOUNTER — Other Ambulatory Visit: Payer: Self-pay | Admitting: Emergency Medicine

## 2024-04-26 DIAGNOSIS — Z9889 Other specified postprocedural states: Secondary | ICD-10-CM

## 2024-04-26 NOTE — Telephone Encounter (Signed)
 Pharmacy Patient Advocate Encounter  Received notification from [employer] that Prior Authorization for Repatha  SureClick 140MG /ML auto-injectors   has been APPROVED from 04/26/2024 to 06/12/2025   PA #/Case ID/Reference #: [id number]

## 2024-04-26 NOTE — Telephone Encounter (Signed)
 Called patient and informed her that her medication has been approved and [facility] will be sending it out soon

## 2024-05-15 ENCOUNTER — Ambulatory Visit: Admitting: Emergency Medicine

## 2024-05-15 ENCOUNTER — Encounter: Payer: Self-pay | Admitting: Emergency Medicine

## 2024-05-15 VITALS — BP 146/80 | HR 90 | Temp 98.2°F | Ht 64.0 in | Wt 246.0 lb

## 2024-05-15 DIAGNOSIS — E785 Hyperlipidemia, unspecified: Secondary | ICD-10-CM

## 2024-05-15 DIAGNOSIS — E1169 Type 2 diabetes mellitus with other specified complication: Secondary | ICD-10-CM | POA: Diagnosis not present

## 2024-05-15 DIAGNOSIS — E1140 Type 2 diabetes mellitus with diabetic neuropathy, unspecified: Secondary | ICD-10-CM | POA: Diagnosis not present

## 2024-05-15 DIAGNOSIS — J449 Chronic obstructive pulmonary disease, unspecified: Secondary | ICD-10-CM

## 2024-05-15 DIAGNOSIS — C50411 Malignant neoplasm of upper-outer quadrant of right female breast: Secondary | ICD-10-CM

## 2024-05-15 DIAGNOSIS — F4323 Adjustment disorder with mixed anxiety and depressed mood: Secondary | ICD-10-CM

## 2024-05-15 DIAGNOSIS — G894 Chronic pain syndrome: Secondary | ICD-10-CM

## 2024-05-15 DIAGNOSIS — E1159 Type 2 diabetes mellitus with other circulatory complications: Secondary | ICD-10-CM

## 2024-05-15 DIAGNOSIS — I152 Hypertension secondary to endocrine disorders: Secondary | ICD-10-CM

## 2024-05-15 DIAGNOSIS — E1149 Type 2 diabetes mellitus with other diabetic neurological complication: Secondary | ICD-10-CM

## 2024-05-15 LAB — POCT GLYCOSYLATED HEMOGLOBIN (HGB A1C): HbA1c POC (<> result, manual entry): 7.2 % (ref 4.0–5.6)

## 2024-05-15 NOTE — Patient Instructions (Signed)

## 2024-05-15 NOTE — Assessment & Plan Note (Signed)
 02/16/2021:Screening mammogram: possible asymmetry in the right breast. Diagnostic mammogram and [place]: 0.7 cm suspicious mass in the right breast at 10:00. 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  Adjuvant chemotherapy with Taxotere and Cytoxan every 3 weeks x4 cycles (patient refused) 2. adjuvant radiation completed 05/27/21 3.  Followed by adjuvant antiestrogen therapy with letrozole started 05/24/2021 -------------------------------------------------------------------------------------------------------------------------- Letrozole toxicities: Tolerating it well without any major problems.  Denies any major hot flashes.  She does have some joint stiffness.   Breast cancer surveillance: 1.  Breast exam 09/19/2023: Benign 2. mammogram 05/05/2023: Benign breast density category B   Return to clinic in 1 year for follow-up

## 2024-05-15 NOTE — Assessment & Plan Note (Signed)
 Diet and nutrition discussed.  Advised to decrease amount of daily carbohydrate intake and daily calories and increase amount of plant-based protein in her diet.

## 2024-05-15 NOTE — Assessment & Plan Note (Signed)
 Stable and well-controlled No concerns identified

## 2024-05-15 NOTE — Assessment & Plan Note (Signed)
Stable.  Follows up with pain management clinic on a regular basis On long-term opiate use

## 2024-05-15 NOTE — Progress Notes (Signed)
 [name] 63 y.o.   Chief Complaint  Patient presents with   Follow-up    HISTORY OF PRESENT ILLNESS: This is a 63 y.o. female here for 75-month follow-up of multiple chronic medical conditions Overall doing well.  Has no complaints or medical concerns today. Lab Results  Component Value Date   HGBA1C 7.2 (A) 02/16/2024     HPI   Prior to Admission medications   Medication Sig Start Date End Date Taking? Authorizing Provider  albuterol  (VENTOLIN  HFA) 108 (90 Base) MCG/ACT inhaler Inhale 2 puffs into the lungs every 6 (six) hours as needed for wheezing or shortness of breath. 08/10/23  Yes Young, Reggy D, MD  amLODipine  (NORVASC ) 5 MG tablet Take 1 tablet (5 mg total) by mouth daily. 11/14/23  Yes Purcell Emil Schanz, MD  BOOSTRIX 5-2.5-18.5 LF-MCG/0.5 injection  08/05/23  Yes [provider]  CAPVAXIVE 0.5 ML injection  08/05/23  Yes [provider]  Continuous Glucose Monitor DEVI Use as directed. 03/28/17  Yes Alfornia Madison, MD  Continuous Glucose Sensor (DEXCOM G7 SENSOR) MISC 1 Device by Does not apply route as directed. Change 10 days 02/16/24  Yes Shamleffer, Ibtehal Jaralla, MD  Cyanocobalamin (B-12 PO) Take 1 tablet by mouth daily.   Yes [provider]  dapagliflozin  propanediol (FARXIGA ) 10 MG TABS tablet Take 1 tablet (10 mg total) by mouth daily. 02/16/24  Yes Shamleffer, Ibtehal Jaralla, MD  diclofenac  Sodium (VOLTAREN ) 1 % GEL Apply 2 g topically 4 (four) times daily. Rub into affected area of foot 2 to 4 times daily 01/14/22  Yes Gershon Donnice SAUNDERS, DPM  dicyclomine  (BENTYL ) 10 MG capsule TAKE 1 CAPSULE BY MOUTH THREE TIMES DAILY BEFORE MEAL(S) 03/04/24  Yes Avram Lupita BRAVO, MD  esomeprazole  (NEXIUM ) 40 MG capsule TAKE ONE CAPSULE (40 MG TOTAL) BY MOUTH DAILY AT 9AM 01/25/24  Yes Avram Lupita BRAVO, MD  estradiol  (ESTRACE ) 0.1 MG/GM vaginal cream Place 1 Applicatorful vaginally at bedtime. 09/01/23  Yes Alyce Inscore, Emil Schanz, MD  famotidine   (PEPCID ) 20 MG tablet Take 1 tablet (20 mg total) by mouth 2 (two) times daily as needed. 02/04/22  Yes Esterwood, Amy S, PA-C  fluconazole  (DIFLUCAN ) 150 MG tablet TAKE 1 TABLET BY MOUTH DAILY FOR 3 DOSES 04/16/24  Yes Jr Milliron, Emil Schanz, MD  hyoscyamine  (ANASPAZ ) 0.125 MG TBDP disintergrating tablet Place 1 tablet (0.125 mg total) under the tongue every 6 (six) hours as needed for up to 20 doses. 09/07/23  Yes Showalter, Steffan BROCKS, MD  Insulin  Disposable Pump (OMNIPOD 5 DEXG7G6 PODS GEN 5) MISC Change pod every 2 days 02/16/24  Yes Shamleffer, Ibtehal Jaralla, MD  insulin  lispro (HUMALOG ) 100 UNIT/ML injection Max Daily 110 units per pump 02/16/24  Yes Shamleffer, Ibtehal Jaralla, MD  letrozole  (FEMARA ) 2.5 MG tablet Take 1 tablet (2.5 mg total) by mouth daily. 09/19/23  Yes Odean Potts, MD  lidocaine  (LIDODERM ) 5 % Place 1 patch onto the skin daily. Remove & Discard patch within 12 hours or as directed by MD 10/22/22  Yes Laurice Maude BROCKS, MD  metFORMIN  (GLUCOPHAGE ) 1000 MG tablet Take 1 tablet (1,000 mg total) by mouth 2 (two) times daily with a meal. 02/16/24  Yes Shamleffer, Donell Cardinal, MD  Misc Natural Products (ELDERBERRY IMMUNE COMPLEX) CHEW Chew 1 tablet by mouth daily.   Yes [provider]  montelukast  (SINGULAIR ) 10 MG tablet TAKE ONE TABLET BY MOUTH DAILY AT 9PM AT BEDTIME 03/01/24  Yes Neysa Reggy JONETTA, MD  Multiple Vitamin (MULTIVITAMIN WITH  MINERALS) TABS tablet Take 1 tablet by mouth daily.   Yes [provider]  NARCAN 4 MG/0.1ML LIQD nasal spray kit Place 0.4 mg into the nose once. 12/06/18  Yes [provider]  pregabalin  (LYRICA ) 150 MG capsule Take 150 mg by mouth 2 (two) times daily. 03/03/23  Yes [provider]  REPATHA  SURECLICK 140 MG/ML SOAJ INJECT (140MG ) SUBCUTANEOUSLY EVERY 14 DAYS 04/09/24  Yes Adine Heimann, Emil Schanz, MD  rosuvastatin  (CRESTOR ) 10 MG tablet Take 1 tablet (10 mg total) by mouth daily. 10/12/23  Yes Shamleffer, Ibtehal Jaralla,  MD  SYMBICORT  160-4.5 MCG/ACT inhaler Inhale 2 puffs into the lungs 2 (two) times daily. 08/10/23  Yes Young, Reggy D, MD  tirzepatide  (MOUNJARO ) 10 MG/0.5ML Pen Inject 10 mg into the skin once a week. 02/16/24  Yes Shamleffer, Ibtehal Jaralla, MD  tiZANidine (ZANAFLEX) 4 MG tablet Take 4 mg by mouth 3 (three) times daily as needed for muscle spasms. 07/23/21  Yes [provider]  valACYclovir  (VALTREX ) 500 MG tablet TAKE 1 TABLET BY MOUTH TWICE DAILY FOR 5 DAYS AT  TIME  OF  OUTBREAK 09/01/23  Yes Michaelangelo Mittelman, Emil Schanz, MD  valsartan  (DIOVAN ) 320 MG tablet Take 1 tablet by mouth once daily 04/16/24  Yes Jaretzy Lhommedieu, Ken[place], MD  Vitamin D , Ergocalciferol , (DRISDOL ) 1.25 MG (50000 UNIT) CAPS capsule Take 50,000 Units by mouth once a week. 08/21/23  Yes [provider]    Allergies  Allergen Reactions   Breo Ellipta  [Fluticasone  Furoate-Vilanterol] Other (See Comments)    Urinary retention   Atorvastatin  Other (See Comments)    Myalgia   Hydrochlorothiazide      Hypokalemia    Lisinopril Cough   Pravastatin     Simvastatin      Patient Active Problem List   Diagnosis Date Noted   Pain, dental 04/02/2024   Type 2 diabetes mellitus with hyperglycemia, with long-term current use of insulin  (HCC) 03/10/2023   Chronic pain of left ankle 02/14/2023   Lumbar degenerative disc disease 10/27/2022   Spinal stenosis of lumbar region without neurogenic claudication 10/27/2022   Osteoarthritis of spine with radiculopathy, lumbar region 10/27/2022   Neck pain on left side 09/28/2022   History of colonic polyps 05/19/2022   Malignant neoplasm of upper-outer quadrant of right breast in female, estrogen receptor positive (HCC) 02/22/2021   Body mass index (BMI) 40.0-44.9, adult (HCC) 07/28/2020   Lumbago with sciatica, right side 07/28/2020   OSA on CPAP 10/31/2018   Stress incontinence in female 03/09/2017   Hepatic steatosis 08/29/2016   Diabetic neuropathy with neurologic  complication (HCC) 10/20/2015   Long term current use of opiate analgesic 07/21/2015   COPD GOLD II  05/31/2013   Lung nodule < 6cm on CT 05/31/2013   Chronic cough 12/07/2012   Chronic pain syndrome 02/13/2012   Lumbar radiculopathy 11/29/2011   Type 2 diabetes mellitus with diabetic neuropathy (HCC) 08/25/2011   Hypertension associated with diabetes (HCC) 07/07/2009   GERD 01/08/2009   Dyslipidemia associated with type 2 diabetes mellitus (HCC) 12/09/2008   Hyperlipidemia 12/09/2008   Morbid obesity due to excess calories (HCC) 12/09/2008    Past Medical History:  Diagnosis Date   Allergy     Arthritis    back    Asthma    AS CHILD   Cancer (HCC)    breast CA- Right,   Chronic back pain    Chronic leg pain    due to back pain   Cocaine abuse (HCC)    in remission  COPD (chronic obstructive pulmonary disease) (HCC)    Diabetes (HCC)    with neuropathy   Dyspnea    GERD (gastroesophageal reflux disease)    Hyperlipidemia    Hypertension    Internal hemorrhoids    Intraductal papilloma of left breast 02/18/2016   Neuromuscular disorder (HCC)    neuropathy   Personal history of radiation therapy    Postlaminectomy syndrome of lumbar region 12/07/2011   RECTAL BLEEDING 12/09/2008   Annotation: s/p EGD 7/08 mild gastritis, s/p colonoscopy 7/08- benign polyp  s/p polypectomy and isolated diverticulum.  Qualifier: Diagnosis of  By: Ditzler RN, Debra     Sciatica    per patient    Sleep apnea    CPAP    YEARS AGO DONE 1/2 YEARS AGO AND WAS TOLD DID NOT HAVE   Tobacco abuse    Tubular adenoma of colon    Uterine fibroid    s/p hysterectomy    Past Surgical History:  Procedure Laterality Date   BACK SURGERY  2012   L5-S1 microendoscopic disectomy last surgery 06/2011   BREAST BIOPSY     LEFT    01/19/16   BREAST BIOPSY Right 02/16/2021   BREAST EXCISIONAL BIOPSY Left 02/2016   BREAST LUMPECTOMY Right 03/12/2021   BREAST LUMPECTOMY WITH RADIOACTIVE SEED AND SENTINEL  LYMPH NODE BIOPSY Right 03/12/2021   Procedure: RIGHT BREAST LUMPECTOMY WITH RADIOACTIVE SEED AND SENTINEL LYMPH NODE BIOPSY;  Surgeon: Curvin Deward MOULD, MD;  Location: [facility];  Service: General;  Laterality: Right;   BREAST REDUCTION SURGERY  1982   CATARACT EXTRACTION Bilateral 11/07/2019   COLONOSCOPY     COLONOSCOPY WITH PROPOFOL  N/A 05/19/2022   Procedure: COLONOSCOPY WITH PROPOFOL ;  Surgeon: Aneita Gwendlyn DASEN, MD;  Location: [facility];  Service: Gastroenterology;  Laterality: N/A;   CYSTOSCOPY WITH BIOPSY N/A 09/07/2023   Procedure: CYSTOSCOPY WITH BIOPSY AND BILATERAL RETROGRADE PYELOGRAM;  Surgeon: Shane Steffan BROCKS, MD;  Location: [facility];  Service: Urology;  Laterality: N/A;  30 MINUTE CASE   HAND SURGERY     MIDDLE TRIGGER FINGER RIGHT SIDE   POLYPECTOMY  05/19/2022   Procedure: POLYPECTOMY;  Surgeon: Aneita Gwendlyn DASEN, MD;  Location: [facility];  Service: Gastroenterology;;   RADIOACTIVE SEED GUIDED EXCISIONAL BREAST BIOPSY Left 02/18/2016   Procedure: LEFT RADIOACTIVE SEED GUIDED EXCISIONAL BREAST BIOPSY;  Surgeon: Alm Angle, MD;  Location: [facility];  Service: General;  Laterality: Left;   REDUCTION MAMMAPLASTY Bilateral    TONSILLECTOMY  2008   TOTAL ABDOMINAL HYSTERECTOMY  05/24/2007   hysterectomy   TUBAL LIGATION     UPPER GASTROINTESTINAL ENDOSCOPY      Social History   Socioeconomic History   Marital status: Married    Spouse name: Curtistine   Number of children: 3   Years of education: Not on file   Highest education level: Some college, no degree  Occupational History    Comment: [employer]  Tobacco Use   Smoking status: Former    Current packs/day: 0.00    Average packs/day: 0.5 packs/day for 20.0 years (10.0 ttl pk-yrs)    Types: Cigarettes    Start date: 07/23/1976    Quit date: 07/23/1996    Years since quitting: 27.8   Smokeless tobacco: Never  Vaping Use   Vaping status: Never Used  Substance and Sexual Activity   Alcohol  use: Not Currently     Comment: recovering addict clean for 9 years   Drug use: Not Currently    Types:  Cocaine, Marijuana    Comment: recovering addict clean for 16 years   Sexual activity: Yes    Birth control/protection: Surgical  Other Topics Concern   Not on file  Social History Narrative   Current Social History 01/17/2020        Patient lives with spouse in a home which is 1 story. There are not steps up to the entrance the patient uses.       Patient's method of transportation is personal car.      The highest level of education was some college.      The patient currently works part-time.      Identified important Relationships are God, husband, kids, grandkids       Pets : None       Interests / Fun: Drawing and nature       Lives with husband-2025      Current Stressors: Pain, Covid       Religious / Personal Beliefs: Christian       Other: None    Social Drivers of Corporate Investment Banker Strain: Low Risk  (07/03/2023)   Overall Financial Resource Strain (CARDIA)    Difficulty of Paying Living Expenses: Not hard at all  Food Insecurity: No Food Insecurity (07/03/2023)   Hunger Vital Sign    Worried About Running Out of Food in the Last Year: Never true    Ran Out of Food in the Last Year: Never true  Transportation Needs: No Transportation Needs (07/03/2023)   PRAPARE - Administrator, Civil Service (Medical): No    Lack of Transportation (Non-Medical): No  Physical Activity: Insufficiently Active (07/03/2023)   Exercise Vital Sign    Days of Exercise per Week: 2 days    Minutes of Exercise per Session: 50 min  Stress: No Stress Concern Present (07/03/2023)   [employer] of Occupational Health - Occupational Stress Questionnaire    Feeling of Stress : Only a little  Social Connections: Socially Integrated (07/03/2023)   Social Connection and Isolation Panel    Frequency of Communication with Friends and Family: More than three times a week    Frequency of  Social Gatherings with Friends and Family: Never    Attends Religious Services: More than 4 times per year    Active Member of [employer] or Organizations: Yes    Attends Banker Meetings: Never    Marital Status: Married  Catering Manager Violence: Not At Risk (07/03/2023)   Humiliation, Afraid, Rape, and Kick questionnaire    Fear of Current or Ex-Partner: No    Emotionally Abused: No    Physically Abused: No    Sexually Abused: No    Family History  Problem Relation Age of Onset   Diabetes Father    Heart disease Father    Hypertension Father    Diabetes Mother    Cancer Mother        brain   Hypertension Mother    Kidney disease Sister    Diabetes Sister    Kidney cancer Sister    Stroke Sister    Diabetes Sister    Hypertension Sister    Diabetes Sister    Hypertension Sister    Obesity Son    Colon cancer Neg Hx    Colon polyps Neg Hx    Esophageal cancer Neg Hx    Rectal cancer Neg Hx    Stomach cancer Neg Hx      Review of Systems  Constitutional: Negative.  Negative for chills and fever.  HENT: Negative.  Negative for congestion and sore throat.   Respiratory: Negative.  Negative for cough and shortness of breath.   Cardiovascular: Negative.  Negative for chest pain and palpitations.  Gastrointestinal:  Negative for abdominal pain, diarrhea, nausea and vomiting.  Genitourinary: Negative.  Negative for dysuria and hematuria.  Skin: Negative.  Negative for rash.  Neurological: Negative.  Negative for dizziness and headaches.  All other systems reviewed and are negative.   Vitals:   05/15/24 0854  BP: (!) 160/80  Pulse: 90  Temp: 98.2 F (36.8 C)    Physical Exam Vitals reviewed.  Constitutional:      Appearance: Normal appearance.  HENT:     Head: Normocephalic.     Mouth/Throat:     Mouth: Mucous membranes are moist.     Pharynx: Oropharynx is clear.  Eyes:     Extraocular Movements: Extraocular movements intact.      Conjunctiva/sclera: Conjunctivae normal.     Pupils: Pupils are equal, round, and reactive to light.  Cardiovascular:     Rate and Rhythm: Normal rate and regular rhythm.     Pulses: Normal pulses.     Heart sounds: Normal heart sounds.  Pulmonary:     Effort: Pulmonary effort is normal.     Breath sounds: Normal breath sounds.  Abdominal:     Palpations: Abdomen is soft.     Tenderness: There is no abdominal tenderness.  Musculoskeletal:     Cervical back: No tenderness.  Lymphadenopathy:     Cervical: No cervical adenopathy.  Skin:    General: Skin is warm and dry.     Capillary Refill: Capillary refill takes less than 2 seconds.  Neurological:     General: No focal deficit present.     Mental Status: She is alert and oriented to person, place, and time.  Psychiatric:        Mood and Affect: Mood normal.        Behavior: Behavior normal.     Lab Results  Component Value Date   HGBA1C 7.2 05/15/2024    ASSESSMENT & PLAN: A total of 43 minutes was spent with the patient and counseling/coordination of care regarding preparing for this visit, review of most recent office visit notes, review of multiple chronic medical conditions and their management, cardiovascular risks associated with hypertension and diabetes, review of all medications, review of most recent bloodwork results including interpretation of today's hemoglobin A1c, review of health maintenance items, education on nutrition, prognosis, documentation, and need for follow up.   Problem List Items Addressed This Visit       Cardiovascular and Mediastinum   Hypertension associated with diabetes (HCC) - Primary   BP Readings from Last 3 Encounters:  05/15/24 (!) 160/80  04/10/24 (!) 140/86  04/02/24 138/80  Elevated blood pressure in the office but normal readings at home Continue amlodipine  5 mg daily and valsartan  320 mg daily Allergic to hydrochlorothiazide  Hemoglobin A1c today at 7.2 Sees endocrinologist  on a regular basis Insulin -dependent.  Also on metformin  and Mounjaro .  Had dose of Mounjaro  recently increased.  Medications and doses managed by endocrinologist office. Cardiovascular risks associated with hypertension and diabetes discussed Diet and nutrition discussed.         Respiratory   COPD GOLD II    Stable and well-controlled No concerns identified        Endocrine   Diabetic neuropathy with neurologic complication (HCC) (Chronic)  Dyslipidemia associated with type 2 diabetes mellitus (HCC)   Relevant Orders   POCT HgB A1C (Completed)     Other   Morbid obesity due to excess calories (HCC)   Diet and nutrition discussed Advised to decrease amount of daily carbohydrate intake and daily calories and increase amount of plant-based protein in her diet        Chronic pain syndrome   Stable.  Follows up with pain management clinic on a regular basis On long-term opiate use      Malignant neoplasm of upper-outer quadrant of right breast in female, estrogen receptor positive (HCC)   02/16/2021:Screening mammogram: possible asymmetry in the right breast. Diagnostic mammogram and US : 0.7 cm suspicious mass in the right breast at 10:00. 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  Adjuvant chemotherapy with Taxotere and Cytoxan every 3 weeks x4 cycles (patient refused) 2. adjuvant radiation completed 05/27/21 3.  Followed by adjuvant antiestrogen therapy with letrozole  started 05/24/2021 -------------------------------------------------------------------------------------------------------------------------- Letrozole  toxicities: Tolerating it well without any major problems.  Denies any  major hot flashes.  She does have some joint stiffness.   Breast cancer surveillance: 1.  Breast exam 09/19/2023: Benign 2. mammogram 05/05/2023: Benign breast density category B   Return to clinic in 1 year for follow-up      Other Visit Diagnoses       Situational mixed anxiety and depressive disorder       Relevant Orders   Ambulatory referral to Psychology        Patient Instructions  Health Maintenance, Female Adopting a healthy lifestyle and getting preventive care are important in promoting health and wellness. Ask your health care provider about: The right schedule for you to have regular tests and exams. Things you can do on your own to prevent diseases and keep yourself healthy. What should I know about diet, weight, and exercise? Eat a healthy diet  Eat a diet that includes plenty of vegetables, fruits, low-fat dairy products, and lean protein. Do not eat a lot of foods that are high in solid fats, added sugars, or sodium. Maintain a healthy weight Body mass index (BMI) is used to identify weight problems. It estimates body fat based on height and weight. Your health care provider can help determine your BMI and help you achieve or maintain a healthy weight. Get regular exercise Get regular exercise. This is one of the most important things you can do for your health. Most adults should: Exercise for at least 150 minutes each week. The exercise should increase your heart rate and make you sweat (moderate-intensity exercise). Do strengthening exercises at least twice a week. This is in addition to the moderate-intensity exercise. Spend less time sitting. Even light physical activity can be beneficial. Watch cholesterol and blood lipids Have your blood tested for lipids and cholesterol at 63 years of age, then have this test every 5 years. Have your cholesterol levels checked more often if: Your lipid or cholesterol levels are high. You are older than 63 years of  age. You are at high risk for heart disease. What should I know about cancer screening? Depending on your health history and family history, you may need to have cancer screening  at various ages. This may include screening for: Breast cancer. Cervical cancer. Colorectal cancer. Skin cancer. Lung cancer. What should I know about heart disease, diabetes, and high blood pressure? Blood pressure and heart disease High blood pressure causes heart disease and increases the risk of stroke. This is more likely to develop in people who have high blood pressure readings or are overweight. Have your blood pressure checked: Every 3-5 years if you are 83-43 years of age. Every year if you are 48 years old or older. Diabetes Have regular diabetes screenings. This checks your fasting blood sugar level. Have the screening done: Once every three years after age 52 if you are at a normal weight and have a low risk for diabetes. More often and at a younger age if you are overweight or have a high risk for diabetes. What should I know about preventing infection? Hepatitis B If you have a higher risk for hepatitis B, you should be screened for this virus. Talk with your health care provider to find out if you are at risk for hepatitis B infection. Hepatitis C Testing is recommended for: Everyone born from [birth year] through 1965. Anyone with known risk factors for hepatitis C. Sexually transmitted infections (STIs) Get screened for STIs, including gonorrhea and chlamydia, if: You are sexually active and are younger than 63 years of age. You are older than 63 years of age and your health care provider tells you that you are at risk for this type of infection. Your sexual activity has changed since you were last screened, and you are at increased risk for chlamydia or gonorrhea. Ask your health care provider if you are at risk. Ask your health care provider about whether you are at high risk for HIV. Your health  care provider may recommend a prescription medicine to help prevent HIV infection. If you choose to take medicine to prevent HIV, you should first get tested for HIV. You should then be tested every 3 months for as long as you are taking the medicine. Pregnancy If you are about to stop having your period (premenopausal) and you may become pregnant, seek counseling before you get pregnant. Take 400 to 800 micrograms (mcg) of folic acid every day if you become pregnant. Ask for birth control (contraception) if you want to prevent pregnancy. Osteoporosis and menopause Osteoporosis is a disease in which the bones lose minerals and strength with aging. This can result in bone fractures. If you are 59 years old or older, or if you are at risk for osteoporosis and fractures, ask your health care provider if you should: Be screened for bone loss. Take a calcium  or vitamin D  supplement to lower your risk of fractures. Be given hormone replacement therapy (HRT) to treat symptoms of menopause. Follow these instructions at home: Alcohol  use Do not drink alcohol  if: Your health care provider tells you not to drink. You are pregnant, may be pregnant, or are planning to become pregnant. If you drink alcohol : Limit how much you have to: 0-1 drink a day. Know how much alcohol  is in your drink. In the [place] one drink equals one 12 oz bottle of beer (355 mL), one 5 oz glass of wine (148 mL), or one 1 oz glass of hard liquor (44 mL). Lifestyle Do not use any products that contain nicotine or tobacco. These products include cigarettes, chewing tobacco, and vaping devices, such as e-cigarettes. If you need help quitting, ask your health care provider. Do not use street drugs.  Do not share needles. Ask your health care provider for help if you need support or information about quitting drugs. General instructions Schedule regular health, dental, and eye exams. Stay current with your vaccines. Tell your health  care provider if: You often feel depressed. You have ever been abused or do not feel safe at home. Summary Adopting a healthy lifestyle and getting preventive care are important in promoting health and wellness. Follow your health care provider's instructions about healthy diet, exercising, and getting tested or screened for diseases. Follow your health care provider's instructions on monitoring your cholesterol and blood pressure. This information is not intended to replace advice given to you by your health care provider. Make sure you discuss any questions you have with your health care provider. Document Revised: 10/19/2020 Document Reviewed: 10/19/2020 Elsevier Patient Education  2024 Elsevier Inc.    Emil Schaumann, MD [facility]

## 2024-05-15 NOTE — Assessment & Plan Note (Addendum)
 BP Readings from Last 3 Encounters:  05/15/24 (!) 160/80  04/10/24 (!) 140/86  04/02/24 138/80  Elevated blood pressure in the office but normal readings at home Continue amlodipine  5 mg daily and valsartan  320 mg daily Allergic to hydrochlorothiazide  Hemoglobin A1c today at 7.2 Sees endocrinologist on a regular basis Insulin -dependent.  Also on metformin  and Mounjaro .  Had dose of Mounjaro  recently increased.  Medications and doses managed by endocrinologist office. Cardiovascular risks associated with hypertension and diabetes discussed Diet and nutrition discussed.

## 2024-05-20 ENCOUNTER — Other Ambulatory Visit: Payer: Self-pay | Admitting: Emergency Medicine

## 2024-05-20 DIAGNOSIS — E1169 Type 2 diabetes mellitus with other specified complication: Secondary | ICD-10-CM

## 2024-05-22 ENCOUNTER — Other Ambulatory Visit: Payer: Self-pay | Admitting: Emergency Medicine

## 2024-05-22 ENCOUNTER — Inpatient Hospital Stay: Admission: RE | Admit: 2024-05-22 | Discharge: 2024-05-22 | Attending: Emergency Medicine

## 2024-05-22 ENCOUNTER — Telehealth: Payer: Self-pay | Admitting: Nutrition

## 2024-05-22 ENCOUNTER — Encounter

## 2024-05-22 ENCOUNTER — Ambulatory Visit
Admission: RE | Admit: 2024-05-22 | Discharge: 2024-05-22 | Disposition: A | Discharge status: Home / Self Care | Source: Ambulatory Visit | Attending: Emergency Medicine | Admitting: Emergency Medicine

## 2024-05-22 DIAGNOSIS — Z9889 Other specified postprocedural states: Secondary | ICD-10-CM

## 2024-05-22 DIAGNOSIS — N6310 Unspecified lump in the right breast, unspecified quadrant: Secondary | ICD-10-CM

## 2024-05-22 MED ORDER — DAPAGLIFLOZIN PROPANEDIOL 10 MG PO TABS: 10.0000 mg | ORAL_TABLET | Freq: Every day | ORAL | 3 refills | Status: AC

## 2024-05-22 MED ORDER — TIRZEPATIDE 10 MG/0.5ML ~~LOC~~ SOAJ: 10.0000 mg | SUBCUTANEOUS | 3 refills | Status: AC

## 2024-05-22 NOTE — Telephone Encounter (Signed)
 Spoke with patient and refills sent to [facility].

## 2024-05-22 NOTE — Telephone Encounter (Signed)
 Message left on my machine to please send refill orders for Monjaro 5mg ., and  Dipagliflozin propanediol (FARXIGA ) 10 mg.  Or fax to [phone]

## 2024-05-27 ENCOUNTER — Encounter (HOSPITAL_BASED_OUTPATIENT_CLINIC_OR_DEPARTMENT_OTHER): Payer: Self-pay

## 2024-06-07 ENCOUNTER — Encounter

## 2024-06-19 ENCOUNTER — Other Ambulatory Visit: Payer: Self-pay | Admitting: Internal Medicine

## 2024-06-19 DIAGNOSIS — E785 Hyperlipidemia, unspecified: Secondary | ICD-10-CM

## 2024-06-28 ENCOUNTER — Telehealth: Payer: Self-pay

## 2024-06-28 NOTE — Progress Notes (Signed)
 Unsuccessful attempt for telephone encounter. Will try again next week.

## 2024-07-01 ENCOUNTER — Other Ambulatory Visit: Payer: Self-pay | Admitting: Emergency Medicine

## 2024-07-01 DIAGNOSIS — E1169 Type 2 diabetes mellitus with other specified complication: Secondary | ICD-10-CM

## 2024-07-03 ENCOUNTER — Ambulatory Visit: Payer: PPO

## 2024-07-03 VITALS — Ht 64.0 in | Wt 246.0 lb

## 2024-07-03 DIAGNOSIS — Z780 Asymptomatic menopausal state: Secondary | ICD-10-CM

## 2024-07-03 DIAGNOSIS — Z0000 Encounter for general adult medical examination without abnormal findings: Secondary | ICD-10-CM | POA: Diagnosis not present

## 2024-07-03 NOTE — Progress Notes (Signed)
 "  Chief Complaint  Patient presents with   Medicare Wellness     Subjective:    [name] is a 64 y.o. female who presents for a Medicare Annual Wellness Visit.  Visit info / Clinical Intake: Medicare Wellness Visit Type:: Subsequent Annual Wellness Visit Persons participating in visit and providing information:: patient Medicare Wellness Visit Mode:: Telephone If telephone:: video declined Since this visit was completed virtually, some vitals may be partially provided or unavailable. Missing vitals are due to the limitations of the virtual format.: Documented vitals are patient reported If Telephone or Video please confirm:: I connected with patient using audio/video enable telemedicine. I verified patient identity with two identifiers, discussed telehealth limitations, and patient agreed to proceed. Patient Location:: Home Provider Location:: Office Interpreter Needed?: No Pre-visit prep was completed: yes AWV questionnaire completed by patient prior to visit?: no Living arrangements:: lives with spouse/significant other Patient's Overall Health Status Rating: good Typical amount of pain: none Does pain affect daily life?: no Are you currently prescribed opioids?: no  Dietary Habits and Nutritional Risks How many meals a day?: 2 (2-3) Eats fruit and vegetables daily?: yes Most meals are obtained by: preparing own meals; eating out In the last 2 weeks, have you had any of the following?: none Diabetic:: (!) yes Any non-healing wounds?: (!) yes How often do you check your BS?: 4; as needed (fasting - 195) Would you like to be referred to a Nutritionist or for Diabetic Management? : no  Functional Status Activities of Daily Living (to include ambulation/medication): Independent Ambulation: Independent with device- listed below Home Assistive Devices/Equipment: Eyeglasses; CPAP Medication Administration: Independent Home Management (perform basic housework or laundry):  Independent Manage your own finances?: yes Primary transportation is: driving Concerns about vision?: no *vision screening is required for WTM* Concerns about hearing?: no  Fall Screening Falls in the past year?: 1 Number of falls in past year: 0 (1) Was there an injury with Fall?: 0 Fall Risk Category Calculator: 1 Patient Fall Risk Level: Low Fall Risk  Fall Risk Patient at Risk for Falls Due to: Other (Comment) (tripped - no fracture) Fall risk Follow up: Falls evaluation completed; Falls prevention discussed  Home and Transportation Safety: All rugs have non-skid backing?: N/A, no rugs All stairs or steps have railings?: N/A, no stairs Grab bars in the bathtub or shower?: (!) no Have non-skid surface in bathtub or shower?: yes Good home lighting?: yes Regular seat belt use?: yes Hospital stays in the last year:: no  Cognitive Assessment Difficulty concentrating, remembering, or making decisions? : no Will 6CIT or Mini Cog be Completed: yes What year is it?: 0 points What month is it?: 0 points Give patient an address phrase to remember (5 components): [address] [place], [place] About what time is it?: 0 points Count backwards from 20 to 1: 0 points Say the months of the year in reverse: 0 points Repeat the address phrase from earlier: 0 points 6 CIT Score: 0 points  Advance Directives (For Healthcare) Does Patient Have a Medical Advance Directive?: No Would patient like information on creating a medical advance directive?: No - Patient declined  Reviewed/Updated  Reviewed/Updated: Reviewed All (Medical, Surgical, Family, Medications, Allergies, Care Teams, Patient Goals)    Allergies (verified) Breo ellipta  [fluticasone  furoate-vilanterol], Atorvastatin , Hydrochlorothiazide , Lisinopril, Pravastatin , and Simvastatin    Current Medications (verified) Outpatient Encounter Medications as of 07/03/2024  Medication Sig   albuterol  (VENTOLIN  HFA) 108 (90 Base) MCG/ACT  inhaler Inhale 2 puffs into the  lungs every 6 (six) hours as needed for wheezing or shortness of breath.   amLODipine  (NORVASC ) 5 MG tablet Take 1 tablet (5 mg total) by mouth daily.   BOOSTRIX 5-2.5-18.5 LF-MCG/0.5 injection    CAPVAXIVE 0.5 ML injection    Continuous Glucose Monitor DEVI Use as directed.   Continuous Glucose Sensor (DEXCOM G7 SENSOR) MISC 1 Device by Does not apply route as directed. Change 10 days   Cyanocobalamin (B-12 PO) Take 1 tablet by mouth daily.   dapagliflozin  propanediol (FARXIGA ) 10 MG TABS tablet Take 1 tablet (10 mg total) by mouth daily.   diclofenac  Sodium (VOLTAREN ) 1 % GEL Apply 2 g topically 4 (four) times daily. Rub into affected area of foot 2 to 4 times daily   dicyclomine  (BENTYL ) 10 MG capsule TAKE 1 CAPSULE BY MOUTH THREE TIMES DAILY BEFORE MEAL(S)   esomeprazole  (NEXIUM ) 40 MG capsule TAKE ONE CAPSULE (40 MG TOTAL) BY MOUTH DAILY AT 9AM   estradiol  (ESTRACE ) 0.1 MG/GM vaginal cream Place 1 Applicatorful vaginally at bedtime.   famotidine  (PEPCID ) 20 MG tablet Take 1 tablet (20 mg total) by mouth 2 (two) times daily as needed.   fluconazole  (DIFLUCAN ) 150 MG tablet TAKE 1 TABLET BY MOUTH DAILY FOR 3 DOSES   hyoscyamine  (ANASPAZ ) 0.125 MG TBDP disintergrating tablet Place 1 tablet (0.125 mg total) under the tongue every 6 (six) hours as needed for up to 20 doses.   Insulin  Disposable Pump (OMNIPOD 5 DEXG7G6 PODS GEN 5) MISC Change pod every 2 days   insulin  lispro (HUMALOG ) 100 UNIT/ML injection Max Daily 110 units per pump   letrozole  (FEMARA ) 2.5 MG tablet Take 1 tablet (2.5 mg total) by mouth daily.   lidocaine  (LIDODERM ) 5 % Place 1 patch onto the skin daily. Remove & Discard patch within 12 hours or as directed by MD   metFORMIN  (GLUCOPHAGE ) 1000 MG tablet Take 1 tablet (1,000 mg total) by mouth 2 (two) times daily with a meal.   Misc Natural Products (ELDERBERRY IMMUNE COMPLEX) CHEW Chew 1 tablet by mouth daily.   montelukast  (SINGULAIR ) 10 MG  tablet TAKE ONE TABLET BY MOUTH DAILY AT 9PM AT BEDTIME   Multiple Vitamin (MULTIVITAMIN WITH MINERALS) TABS tablet Take 1 tablet by mouth daily.   NARCAN 4 MG/0.1ML LIQD nasal spray kit Place 0.4 mg into the nose once.   pregabalin  (LYRICA ) 150 MG capsule Take 150 mg by mouth 2 (two) times daily.   REPATHA  SURECLICK 140 MG/ML SOAJ INJECT 140 MG SUBCUTANEOUSLY EVERY 14 DAYS   rosuvastatin  (CRESTOR ) 10 MG tablet TAKE ONE TABLET (10 MG TOTAL) BY MOUTH DAILY AT 5PM   SYMBICORT  160-4.5 MCG/ACT inhaler Inhale 2 puffs into the lungs 2 (two) times daily.   tirzepatide  (MOUNJARO ) 10 MG/0.5ML Pen Inject 10 mg into the skin once a week.   tiZANidine (ZANAFLEX) 4 MG tablet Take 4 mg by mouth 3 (three) times daily as needed for muscle spasms.   valACYclovir  (VALTREX ) 500 MG tablet TAKE 1 TABLET BY MOUTH TWICE DAILY FOR 5 DAYS AT  TIME  OF  OUTBREAK   valsartan  (DIOVAN ) 320 MG tablet Take 1 tablet by mouth once daily   Vitamin D , Ergocalciferol , (DRISDOL ) 1.25 MG (50000 UNIT) CAPS capsule Take 50,000 Units by mouth once a week.   No facility-administered encounter medications on file as of 07/03/2024.    History: Past Medical History:  Diagnosis Date   Allergy     Arthritis    back    Asthma  AS CHILD   Cancer (HCC)    breast CA- Right,   Chronic back pain    Chronic leg pain    due to back pain   Cocaine abuse (HCC)    in remission   COPD (chronic obstructive pulmonary disease) (HCC)    Diabetes (HCC)    with neuropathy   Dyspnea    GERD (gastroesophageal reflux disease)    Hyperlipidemia    Hypertension    Internal hemorrhoids    Intraductal papilloma of left breast 02/18/2016   Neuromuscular disorder (HCC)    neuropathy   Personal history of radiation therapy    Postlaminectomy syndrome of lumbar region 12/07/2011   RECTAL BLEEDING 12/09/2008   Annotation: s/p EGD 7/08 mild gastritis, s/p colonoscopy 7/08- benign polyp  s/p polypectomy and isolated diverticulum.  Qualifier:  Diagnosis of  By: Ditzler RN, Debra     Sciatica    per patient    Sleep apnea    CPAP    YEARS AGO DONE 1/2 YEARS AGO AND WAS TOLD DID NOT HAVE   Tobacco abuse    Tubular adenoma of colon    Uterine fibroid    s/p hysterectomy   Past Surgical History:  Procedure Laterality Date   BACK SURGERY  2012   L5-S1 microendoscopic disectomy last surgery 06/2011   BREAST BIOPSY     LEFT    01/19/16   BREAST BIOPSY Right 02/16/2021   BREAST EXCISIONAL BIOPSY Left 02/2016   BREAST LUMPECTOMY Right 03/12/2021   BREAST LUMPECTOMY WITH RADIOACTIVE SEED AND SENTINEL LYMPH NODE BIOPSY Right 03/12/2021   Procedure: RIGHT BREAST LUMPECTOMY WITH RADIOACTIVE SEED AND SENTINEL LYMPH NODE BIOPSY;  Surgeon: Curvin Deward MOULD, MD;  Location: [facility];  Service: General;  Laterality: Right;   BREAST REDUCTION SURGERY  1982   CATARACT EXTRACTION Bilateral 11/07/2019   COLONOSCOPY     COLONOSCOPY WITH PROPOFOL  N/A 05/19/2022   Procedure: COLONOSCOPY WITH PROPOFOL ;  Surgeon: Aneita Gwendlyn DASEN, MD;  Location: [facility];  Service: Gastroenterology;  Laterality: N/A;   CYSTOSCOPY WITH BIOPSY N/A 09/07/2023   Procedure: CYSTOSCOPY WITH BIOPSY AND BILATERAL RETROGRADE PYELOGRAM;  Surgeon: Shane Steffan BROCKS, MD;  Location: [facility];  Service: Urology;  Laterality: N/A;  30 MINUTE CASE   HAND SURGERY     MIDDLE TRIGGER FINGER RIGHT SIDE   POLYPECTOMY  05/19/2022   Procedure: POLYPECTOMY;  Surgeon: Aneita Gwendlyn DASEN, MD;  Location: [facility];  Service: Gastroenterology;;   RADIOACTIVE SEED GUIDED EXCISIONAL BREAST BIOPSY Left 02/18/2016   Procedure: LEFT RADIOACTIVE SEED GUIDED EXCISIONAL BREAST BIOPSY;  Surgeon: Alm Angle, MD;  Location: [facility];  Service: General;  Laterality: Left;   REDUCTION MAMMAPLASTY Bilateral    TONSILLECTOMY  2008   TOTAL ABDOMINAL HYSTERECTOMY  05/24/2007   hysterectomy   TUBAL LIGATION     UPPER GASTROINTESTINAL ENDOSCOPY     Family History  Problem Relation Age of Onset   Diabetes  Father    Heart disease Father    Hypertension Father    Diabetes Mother    Cancer Mother        brain   Hypertension Mother    Kidney disease Sister    Diabetes Sister    Kidney cancer Sister    Stroke Sister    Diabetes Sister    Hypertension Sister    Diabetes Sister    Hypertension Sister    Obesity Son    Colon cancer Neg Hx    Colon polyps Neg Hx  Esophageal cancer Neg Hx    Rectal cancer Neg Hx    Stomach cancer Neg Hx    Social History   Occupational History    Comment: [employer]  Tobacco Use   Smoking status: Former    Current packs/day: 0.00    Average packs/day: 0.5 packs/day for 20.0 years (10.0 ttl pk-yrs)    Types: Cigarettes    Start date: 07/23/1976    Quit date: 07/23/1996    Years since quitting: 27.9   Smokeless tobacco: Never  Vaping Use   Vaping status: Never Used  Substance and Sexual Activity   Alcohol  use: Not Currently    Comment: recovering addict clean for 9 years   Drug use: Not Currently    Types: Cocaine, Marijuana    Comment: recovering addict clean for 16 years   Sexual activity: Yes    Birth control/protection: Surgical   Tobacco Counseling Counseling given: Yes  SDOH Screenings   Food Insecurity: No Food Insecurity (07/03/2024)  Housing: Unknown (07/03/2024)  Transportation Needs: No Transportation Needs (07/03/2024)  Utilities: Not At Risk (07/03/2024)  Alcohol  Screen: Low Risk (07/03/2023)  Depression (PHQ2-9): Low Risk (07/03/2024)  Financial Resource Strain: Low Risk (07/03/2023)  Physical Activity: Insufficiently Active (07/03/2024)  Social Connections: Socially Integrated (07/03/2024)  Stress: No Stress Concern Present (07/03/2024)  Tobacco Use: Medium Risk (07/03/2024)  Health Literacy: Adequate Health Literacy (07/03/2024)   See flowsheets for full screening details  Depression Screen PHQ 2 & 9 Depression Scale- Over the past 2 weeks, how often have you been bothered by any of the following problems? Little  interest or pleasure in doing things: 0 Feeling down, depressed, or hopeless (PHQ Adolescent also includes...irritable): 0 PHQ-2 Total Score: 0 Trouble falling or staying asleep, or sleeping too much: 0 Feeling tired or having little energy: 0 Poor appetite or overeating (PHQ Adolescent also includes...weight loss): 0 Feeling bad about yourself - or that you are a failure or have let yourself or your family down: 0 Trouble concentrating on things, such as reading the newspaper or watching television (PHQ Adolescent also includes...like school work): 0 Moving or speaking so slowly that other people could have noticed. Or the opposite - being so fidgety or restless that you have been moving around a lot more than usual: 0 Thoughts that you would be better off dead, or of hurting yourself in some way: 0 PHQ-9 Total Score: 0 If you checked off any problems, how difficult have these problems made it for you to do your work, take care of things at home, or get along with other people?: Not difficult at all  Depression Treatment Depression Interventions/Treatment : EYV7-0 Score <4 Follow-up Not Indicated     Goals Addressed               This Visit's Progress     Patient Stated (pt-stated)        Patient stated she plans to walk more and reduce sugar intake and manage weight             Objective:    Today's Vitals   07/03/24 0933  Weight: 246 lb (111.6 kg)  Height: 5' 4 (1.626 m)   Body mass index is 42.23 kg/m.  Hearing/Vision screen Hearing Screening - Comments:: Denies hearing difficulties   Vision Screening - Comments:: Wears eyeglasses for reading - up to date with routine eye exams with [facility] Immunizations and Health Maintenance Health Maintenance  Topic Date Due   Diabetic kidney evaluation -  Urine ACR  02/12/2020   Bone Density Scan  09/15/2023   Pneumococcal Vaccine: 50+ Years (2 of 2 - PCV) 08/04/2024   FOOT EXAM  07/12/2024   LIPID PANEL  07/17/2024    HEMOGLOBIN A1C  08/13/2024   Diabetic kidney evaluation - eGFR measurement  09/18/2024   OPHTHALMOLOGY EXAM  12/11/2024   Mammogram  05/22/2025   Medicare Annual Wellness (AWV)  07/03/2025   Colonoscopy  05/20/2027   DTaP/Tdap/Td (4 - Td or Tdap) 08/04/2033   Influenza Vaccine  Completed   HPV VACCINES (No Doses Required) Completed   Hepatitis C Screening  Completed   HIV Screening  Completed   Zoster Vaccines- Shingrix   Completed   Hepatitis B Vaccines 19-59 Average Risk  Aged Out   Meningococcal B Vaccine  Aged Out   COVID-19 Vaccine  Discontinued        Assessment/Plan:  This is a routine wellness examination for Keyli.  DEXA Scan status- ordered today  Patient Care Team: Purcell Emil Schanz, MD as PCP - General (Internal Medicine) Shamleffer, Donell Cardinal, MD as Consulting Physician (Endocrinology) [facility] Neysa Reggy BIRCH, MD as Consulting Physician (Pulmonary Disease) Gershon Donnice SAUNDERS, DPM as Consulting Physician (Podiatry) Aneita Gwendlyn DASEN, MD (Inactive) as Consulting Physician (Gastroenterology) Curvin Deward MOULD, MD as Consulting Physician (General Surgery) Odean Potts, MD as Consulting Physician (Hematology and Oncology) Dewey Rush, MD as Consulting Physician (Radiation Oncology) [facility] Merceda File R, RPH-CPP (Pharmacist)  I have personally reviewed and noted the following in the patients chart:   Medical and social history Use of alcohol , tobacco or illicit drugs  Current medications and supplements including opioid prescriptions. Functional ability and status Nutritional status Physical activity Advanced directives List of other physicians Hospitalizations, surgeries, and ER visits in previous 12 months Vitals Screenings to include cognitive, depression, and falls Referrals and appointments  Orders Placed This Encounter  Procedures   DG Bone Density    Standing Status:   Future     Expiration Date:   07/03/2025    Reason for Exam (SYMPTOM  OR DIAGNOSIS REQUIRED):   postmenopausal deficiency    Preferred imaging location?:   [address]   In addition, I have reviewed and discussed with patient certain preventive protocols, quality metrics, and best practice recommendations. A written personalized care plan for preventive services as well as general preventive health recommendations were provided to patient.   Verdie CHRISTELLA Saba, CMA   07/03/2024   Return in 1 year (on 07/03/2025).  After Visit Summary: (MyChart) Due to this being a telephonic visit, the after visit summary with patients personalized plan was offered to patient via MyChart   Nurse Notes:  "

## 2024-07-03 NOTE — Patient Instructions (Addendum)
 [name],  Thank you for taking the time for your Medicare Wellness Visit. I appreciate your continued commitment to your health goals. Please review the care plan we discussed, and feel free to reach out if I can assist you further.  Please note that Annual Wellness Visits do not include a physical exam. Some assessments may be limited, especially if the visit was conducted virtually. If needed, we may recommend an in-person follow-up with your provider.  Ongoing Care Seeing your primary care provider every 3 to 6 months helps us  monitor your health and provide consistent, personalized care.   Referrals If a referral was made during today's visit and you haven't received any updates within two weeks, please contact the referred provider directly to check on the status.  Recommended Screenings:  Health Maintenance  Topic Date Due   Kidney health urinalysis for diabetes  02/12/2020   Osteoporosis screening with Bone Density Scan  09/15/2023   Pneumococcal Vaccine for age over 63 (2 of 2 - PCV) 08/04/2024   Complete foot exam   07/12/2024   Lipid (cholesterol) test  07/17/2024   Hemoglobin A1C  08/13/2024   Yearly kidney function blood test for diabetes  09/18/2024   Eye exam for diabetics  12/11/2024   Breast Cancer Screening  05/22/2025   Medicare Annual Wellness Visit  07/03/2025   Colon Cancer Screening  05/20/2027   DTaP/Tdap/Td vaccine (4 - Td or Tdap) 08/04/2033   Flu Shot  Completed   HPV Vaccine (No Doses Required) Completed   Hepatitis C Screening  Completed   HIV Screening  Completed   Zoster (Shingles) Vaccine  Completed   Hepatitis B Vaccine  Aged Out   Meningitis B Vaccine  Aged Out   COVID-19 Vaccine  Discontinued       07/03/2024    9:35 AM  Advanced Directives  Does Patient Have a Medical Advance Directive? No  Would patient like information on creating a medical advance directive? No - Patient declined    Vision: Annual vision screenings are recommended  for early detection of glaucoma, cataracts, and diabetic retinopathy. These exams can also reveal signs of chronic conditions such as diabetes and high blood pressure.  Dental: Annual dental screenings help detect early signs of oral cancer, gum disease, and other conditions linked to overall health, including heart disease and diabetes.

## 2024-07-17 ENCOUNTER — Other Ambulatory Visit: Payer: Self-pay | Admitting: Internal Medicine

## 2024-08-16 ENCOUNTER — Ambulatory Visit: Admitting: Internal Medicine

## 2024-09-24 ENCOUNTER — Ambulatory Visit: Admitting: Hematology and Oncology

## 2024-11-13 ENCOUNTER — Ambulatory Visit: Admitting: Emergency Medicine
